# Patient Record
Sex: Male | Born: 1965
Health system: Southern US, Community
[De-identification: ages and names within clinical notes are randomized; demographics above are authoritative.]

## PROBLEM LIST (undated history)

## (undated) DIAGNOSIS — I10 Essential (primary) hypertension: Secondary | ICD-10-CM

## (undated) DIAGNOSIS — R269 Unspecified abnormalities of gait and mobility: Secondary | ICD-10-CM

## (undated) DIAGNOSIS — I639 Cerebral infarction, unspecified: Secondary | ICD-10-CM

## (undated) DIAGNOSIS — C189 Malignant neoplasm of colon, unspecified: Secondary | ICD-10-CM

## (undated) DIAGNOSIS — E119 Type 2 diabetes mellitus without complications: Secondary | ICD-10-CM

## (undated) DIAGNOSIS — I69328 Other speech and language deficits following cerebral infarction: Secondary | ICD-10-CM

## (undated) DIAGNOSIS — R569 Unspecified convulsions: Secondary | ICD-10-CM

## (undated) DIAGNOSIS — I69359 Hemiplegia and hemiparesis following cerebral infarction affecting unspecified side: Secondary | ICD-10-CM

## (undated) HISTORY — DX: Hemiplegia and hemiparesis following cerebral infarction affecting unspecified side: I69.359

## (undated) HISTORY — DX: Unspecified convulsions: R56.9

## (undated) HISTORY — DX: Malignant neoplasm of colon, unspecified: C18.9

## (undated) HISTORY — PX: APPENDECTOMY: SHX54

## (undated) HISTORY — DX: Unspecified abnormalities of gait and mobility: R26.9

## (undated) HISTORY — PX: TOTAL HIP ARTHROPLASTY: SHX124

## (undated) HISTORY — DX: Other speech and language deficits following cerebral infarction: I69.328

## (undated) HISTORY — DX: Type 2 diabetes mellitus without complications: E11.9

## (undated) HISTORY — DX: Cerebral infarction, unspecified: I63.9

---

## 1997-08-16 ENCOUNTER — Ambulatory Visit (HOSPITAL_COMMUNITY): Admission: RE | Admit: 1997-08-16 | Discharge: 1997-08-16 | Payer: Self-pay | Admitting: Cardiology

## 1998-02-14 ENCOUNTER — Encounter: Admission: RE | Admit: 1998-02-14 | Discharge: 1998-02-14 | Payer: Self-pay | Admitting: *Deleted

## 2000-11-26 ENCOUNTER — Encounter: Payer: Self-pay | Admitting: Emergency Medicine

## 2000-11-26 ENCOUNTER — Emergency Department (HOSPITAL_COMMUNITY): Admission: EM | Admit: 2000-11-26 | Discharge: 2000-11-27 | Payer: Self-pay | Admitting: Emergency Medicine

## 2002-01-13 HISTORY — PX: JOINT REPLACEMENT: SHX530

## 2002-06-17 ENCOUNTER — Encounter: Payer: Self-pay | Admitting: Orthopedic Surgery

## 2002-06-20 ENCOUNTER — Inpatient Hospital Stay (HOSPITAL_COMMUNITY): Admission: RE | Admit: 2002-06-20 | Discharge: 2002-06-24 | Payer: Self-pay | Admitting: Orthopedic Surgery

## 2002-06-20 ENCOUNTER — Encounter: Payer: Self-pay | Admitting: Orthopedic Surgery

## 2003-02-28 ENCOUNTER — Emergency Department (HOSPITAL_COMMUNITY): Admission: EM | Admit: 2003-02-28 | Discharge: 2003-02-28 | Payer: Self-pay | Admitting: Family Medicine

## 2004-03-28 ENCOUNTER — Emergency Department (HOSPITAL_COMMUNITY): Admission: EM | Admit: 2004-03-28 | Discharge: 2004-03-28 | Payer: Self-pay | Admitting: Family Medicine

## 2005-01-18 ENCOUNTER — Emergency Department (HOSPITAL_COMMUNITY): Admission: EM | Admit: 2005-01-18 | Discharge: 2005-01-19 | Payer: Self-pay | Admitting: Emergency Medicine

## 2005-12-04 ENCOUNTER — Emergency Department (HOSPITAL_COMMUNITY): Admission: EM | Admit: 2005-12-04 | Discharge: 2005-12-04 | Payer: Self-pay | Admitting: Emergency Medicine

## 2007-01-14 DIAGNOSIS — C189 Malignant neoplasm of colon, unspecified: Secondary | ICD-10-CM

## 2007-01-14 DIAGNOSIS — C20 Malignant neoplasm of rectum: Secondary | ICD-10-CM

## 2007-01-14 HISTORY — DX: Malignant neoplasm of rectum: C20

## 2007-01-14 HISTORY — DX: Malignant neoplasm of colon, unspecified: C18.9

## 2007-08-28 ENCOUNTER — Inpatient Hospital Stay (HOSPITAL_COMMUNITY): Admission: EM | Admit: 2007-08-28 | Discharge: 2007-08-30 | Payer: Self-pay | Admitting: Emergency Medicine

## 2007-08-29 ENCOUNTER — Encounter (INDEPENDENT_AMBULATORY_CARE_PROVIDER_SITE_OTHER): Payer: Self-pay | Admitting: Gastroenterology

## 2007-08-29 ENCOUNTER — Encounter: Payer: Self-pay | Admitting: Gastroenterology

## 2007-08-31 ENCOUNTER — Encounter: Payer: Self-pay | Admitting: Gastroenterology

## 2007-09-01 ENCOUNTER — Encounter: Admission: RE | Admit: 2007-09-01 | Discharge: 2007-09-01 | Payer: Self-pay | Admitting: Gastroenterology

## 2007-09-02 ENCOUNTER — Encounter: Payer: Self-pay | Admitting: Gastroenterology

## 2007-09-02 DIAGNOSIS — C20 Malignant neoplasm of rectum: Secondary | ICD-10-CM | POA: Insufficient documentation

## 2007-09-03 ENCOUNTER — Ambulatory Visit: Payer: Self-pay | Admitting: Gastroenterology

## 2007-09-09 ENCOUNTER — Ambulatory Visit (HOSPITAL_COMMUNITY): Admission: RE | Admit: 2007-09-09 | Discharge: 2007-09-09 | Payer: Self-pay | Admitting: Gastroenterology

## 2007-09-09 ENCOUNTER — Ambulatory Visit: Payer: Self-pay | Admitting: Gastroenterology

## 2007-09-21 ENCOUNTER — Ambulatory Visit (HOSPITAL_BASED_OUTPATIENT_CLINIC_OR_DEPARTMENT_OTHER): Admission: RE | Admit: 2007-09-21 | Discharge: 2007-09-21 | Payer: Self-pay | Admitting: Otolaryngology

## 2007-10-27 ENCOUNTER — Ambulatory Visit: Payer: Self-pay | Admitting: Oncology

## 2007-10-28 LAB — CBC WITH DIFFERENTIAL/PLATELET
BASO%: 0.1 % (ref 0.0–2.0)
LYMPH%: 16.8 % (ref 14.0–48.0)
MCHC: 32.4 g/dL (ref 32.0–35.9)
MONO#: 0.4 10*3/uL (ref 0.1–0.9)
NEUT#: 5.1 10*3/uL (ref 1.5–6.5)
RBC: 5.29 10*6/uL (ref 4.20–5.71)
RDW: 24.5 % — ABNORMAL HIGH (ref 11.2–14.6)
WBC: 6.7 10*3/uL (ref 4.0–10.0)
lymph#: 1.1 10*3/uL (ref 0.9–3.3)

## 2007-10-29 ENCOUNTER — Ambulatory Visit: Admission: RE | Admit: 2007-10-29 | Discharge: 2008-01-13 | Payer: Self-pay | Admitting: Radiation Oncology

## 2007-10-29 LAB — COMPREHENSIVE METABOLIC PANEL
ALT: 18 U/L (ref 0–53)
Albumin: 4.5 g/dL (ref 3.5–5.2)
CO2: 22 mEq/L (ref 19–32)
Chloride: 107 mEq/L (ref 96–112)
Potassium: 4 mEq/L (ref 3.5–5.3)
Sodium: 141 mEq/L (ref 135–145)
Total Bilirubin: 0.4 mg/dL (ref 0.3–1.2)
Total Protein: 7.5 g/dL (ref 6.0–8.3)

## 2007-10-29 LAB — CEA: CEA: 1 ng/mL (ref 0.0–5.0)

## 2007-12-08 LAB — CBC WITH DIFFERENTIAL/PLATELET
Basophils Absolute: 0 10*3/uL (ref 0.0–0.1)
Eosinophils Absolute: 0.1 10*3/uL (ref 0.0–0.5)
HGB: 11.4 g/dL — ABNORMAL LOW (ref 13.0–17.1)
LYMPH%: 10.4 % — ABNORMAL LOW (ref 14.0–48.0)
MCV: 70.4 fL — ABNORMAL LOW (ref 81.6–98.0)
MONO#: 0.3 10*3/uL (ref 0.1–0.9)
NEUT#: 3.7 10*3/uL (ref 1.5–6.5)
Platelets: 252 10*3/uL (ref 145–400)
RBC: 4.82 10*6/uL (ref 4.20–5.71)
RDW: 18.5 % — ABNORMAL HIGH (ref 11.2–14.6)
WBC: 4.6 10*3/uL (ref 4.0–10.0)

## 2007-12-08 LAB — COMPREHENSIVE METABOLIC PANEL
Albumin: 4.3 g/dL (ref 3.5–5.2)
BUN: 13 mg/dL (ref 6–23)
CO2: 26 mEq/L (ref 19–32)
Calcium: 9.3 mg/dL (ref 8.4–10.5)
Glucose, Bld: 84 mg/dL (ref 70–99)
Potassium: 4.3 mEq/L (ref 3.5–5.3)
Sodium: 138 mEq/L (ref 135–145)
Total Protein: 6.9 g/dL (ref 6.0–8.3)

## 2007-12-08 LAB — CEA: CEA: 1.5 ng/mL (ref 0.0–5.0)

## 2007-12-24 ENCOUNTER — Ambulatory Visit: Payer: Self-pay | Admitting: Oncology

## 2008-04-19 ENCOUNTER — Ambulatory Visit: Payer: Self-pay | Admitting: Oncology

## 2008-04-19 LAB — COMPREHENSIVE METABOLIC PANEL
AST: 25 U/L (ref 0–37)
Albumin: 3.8 g/dL (ref 3.5–5.2)
Alkaline Phosphatase: 75 U/L (ref 39–117)
BUN: 10 mg/dL (ref 6–23)
Creatinine, Ser: 0.85 mg/dL (ref 0.40–1.50)
Glucose, Bld: 124 mg/dL — ABNORMAL HIGH (ref 70–99)
Potassium: 3.8 mEq/L (ref 3.5–5.3)

## 2008-04-19 LAB — CBC WITH DIFFERENTIAL/PLATELET
BASO%: 0.3 % (ref 0.0–2.0)
Basophils Absolute: 0 10*3/uL (ref 0.0–0.1)
EOS%: 1.4 % (ref 0.0–7.0)
HCT: 38.9 % (ref 38.4–49.9)
HGB: 13.2 g/dL (ref 13.0–17.1)
LYMPH%: 13.9 % — ABNORMAL LOW (ref 14.0–49.0)
MCH: 25.7 pg — ABNORMAL LOW (ref 27.2–33.4)
MCHC: 33.9 g/dL (ref 32.0–36.0)
MCV: 75.8 fL — ABNORMAL LOW (ref 79.3–98.0)
MONO%: 7.1 % (ref 0.0–14.0)
NEUT%: 77.3 % — ABNORMAL HIGH (ref 39.0–75.0)
Platelets: 184 10*3/uL (ref 140–400)

## 2008-04-26 ENCOUNTER — Ambulatory Visit (HOSPITAL_COMMUNITY): Admission: RE | Admit: 2008-04-26 | Discharge: 2008-04-26 | Payer: Self-pay | Admitting: Oncology

## 2008-07-31 ENCOUNTER — Ambulatory Visit: Payer: Self-pay | Admitting: Oncology

## 2008-08-02 LAB — CBC WITH DIFFERENTIAL/PLATELET
BASO%: 0.3 % (ref 0.0–2.0)
EOS%: 1.5 % (ref 0.0–7.0)
LYMPH%: 14.1 % (ref 14.0–49.0)
MCH: 26.7 pg — ABNORMAL LOW (ref 27.2–33.4)
MCHC: 34.1 g/dL (ref 32.0–36.0)
MONO#: 0.2 10*3/uL (ref 0.1–0.9)
Platelets: 207 10*3/uL (ref 140–400)
RBC: 5.31 10*6/uL (ref 4.20–5.82)
WBC: 4.3 10*3/uL (ref 4.0–10.3)

## 2008-08-02 LAB — COMPREHENSIVE METABOLIC PANEL
ALT: 20 U/L (ref 0–53)
AST: 15 U/L (ref 0–37)
Alkaline Phosphatase: 75 U/L (ref 39–117)
CO2: 21 mEq/L (ref 19–32)
Creatinine, Ser: 1.14 mg/dL (ref 0.40–1.50)
Sodium: 140 mEq/L (ref 135–145)
Total Bilirubin: 0.6 mg/dL (ref 0.3–1.2)
Total Protein: 7.8 g/dL (ref 6.0–8.3)

## 2008-08-02 LAB — CEA: CEA: 0.9 ng/mL (ref 0.0–5.0)

## 2008-11-17 ENCOUNTER — Ambulatory Visit: Payer: Self-pay | Admitting: Oncology

## 2009-08-13 ENCOUNTER — Ambulatory Visit: Payer: Self-pay | Admitting: Oncology

## 2010-05-28 NOTE — Consult Note (Signed)
NAMEBELINDA, BRINGHURST NO.:  [id number]   MEDICAL RECORD NO.:  [id number]          PATIENT TYPE:  INP   LOCATION:  3030                         FACILITY:  [facility]   PHYSICIAN:  Shirley Friar, MDDATE OF BIRTH:  [birth year]   DATE OF CONSULTATION:  DATE OF DISCHARGE:                                 CONSULTATION   REQUESTING PHYSICIAN:  Dr. Ramiro Harvest, MD   REASON:  Anemia, rectal bleeding.   HISTORY OF PRESENT ILLNESS:  Mr. Kinker is a pleasant 45 year old  black male who was admitted this morning with a hemoglobin of 7.0 in the  setting of chronic rectal bleeding.  He reports having intermittent  bright red blood per rectum on toilet tissue as well as in the toilet  bowl for 2 years.  He says, he was told he had hemorrhoids and  occasionally reports that the hemorrhoids push out of his rectum.  He  was given hemorrhoid cream as well as tried over-the-counter cream but  continued to have persistent bright red blood per rectum.  Denies any  black stools.   PAST MEDICAL HISTORY:  Hypertension.   MEDICATIONS:  None prior to admission.   ALLERGIES:  NO KNOWN DRUG ALLERGIES.   FAMILY HISTORY:  Noncontributory.   SOCIAL HISTORY:  Denies alcohol or smoking.   REVIEW OF SYSTEMS:  GI:  Negative from GI standpoint as stated above.   PHYSICAL EXAM:  VITAL SIGNS:  Temperature 98.3, pulse 88, blood pressure  147/94, O2 sat is 96% on room air.  GENERAL:  Alert, no acute distress.  ABDOMEN:  Soft, nontender, nondistended.  Active bowel sounds.   LABS:  Hemoglobin 7.0 on admission with an MCV of 56, platelet count  301, hemoglobin currently 9.1, INR 1.1.   IMPRESSION:  45 year old black male with chronic intermittent bright red  blood per rectum and microcytic anemia.  I think his bright red blood  per rectum is most likely internal hemorrhoids related, but would  recommend a colonoscopy to evaluate for other sources of rectal  bleeding.  Also with his  microcytic anemia he needs another colonoscopy  and possible upper endoscopy.  I discussed risks, benefits of the  procedure with him, he agrees to proceed.  The patient on clear liquid  diet and start prep for colonoscopy today with plans to do the procedure  on  August 29, 2007.  If these procedures are negative for source of his  bleeding but he has a large hemorrhoids, then may benefit from  therapeutic maneuvers to hemorrhoids prior to looking for other  etiologies.  I doubt he will need a small bowel evaluation if these  procedures and negative unless his hemorrhoids are minimal.      Shirley Friar, MD  Electronically Signed     VCS/MEDQ  D:  08/28/2007  T:  08/29/2007  Job:  [id number]

## 2010-05-28 NOTE — Op Note (Signed)
NAMEPASCAL, STIGGERS             ACCOUNT NO.:  [id number]   MEDICAL RECORD NO.:  [id number]          PATIENT TYPE:  AMB   LOCATION:  DSC                          FACILITY:  [facility]   PHYSICIAN:  Almond Lint, MD       DATE OF BIRTH:  12-04-1965   DATE OF PROCEDURE:  09/21/2007  DATE OF DISCHARGE:                               OPERATIVE REPORT   PREOPERATIVE DIAGNOSIS:  Rectal carcinoma.   POSTOPERATIVE DIAGNOSIS:  Rectal carcinoma.   PROCEDURE:  Transanal excision of rectal carcinoma.   SURGEON:  Almond Lint, MD   ANESTHESIA:  Local plus general.   SPECIMEN:  Polyp and additional distal margin to pathology.   ESTIMATED BLOOD LOSS:  25 mL.   COMPLICATIONS:  None.   PROCEDURE:  Mr. Wiedeman was identified in the holding area and taken to  the operating room and placed in the operating room in supine fashion.  General endotracheal anesthesia was induced.  The patient was then  placed in the lithotomy position and his perirectal area was prepped and  draped in sterile fashion.  Prior to commencing the procedure, a time-  out was performed confirming the patient, the site of surgery,  equipment, preoperative antibiotic administration, and operating room  staff.  These were correct and so we commenced.   First a digital examination was done.  The patient definitely had  several large external hemorrhoids.  The proctoscope was placed into the  rectum and immediately a large polypoid lesion popped into view.  This  has not been immediately apparent on exam, as it was so large and soft.  The area of stalk of this polyp was anesthetized as well as the  sphincter.  The polyp was then excised with the Metzenbaum scissors and  the Bovie.  This was on top of a hemorrhoidal pile and there was one  site of bleeding which was controlled using a 3-0 chromic.  The  sphincter was palpated making sure we were not going to the sphincter,  however, because of this we were unable to take a  full-thickness portion  of the rectum.  The mucosa proximal to this lesion appeared to have some  mosaicism.  Once the polyp was completely excised, it was apparent that  there was a minimal margin on the distal mucosa.  An additional 5 mm of  mucosa were taken distally.  The defect was then closed partially with  running 2-0 Vicryl.  The distal aspect of the incision was left open.  This was irrigated and there was no bleeding and no hematoma seen.  The  remainder of the anus was examined with the anoscope and he had no  additional lesions seen.  He had several small  internal hemorrhoids in addition to the external hemorrhoids.  The  rectum was then packed using Gelfoam with lidocaine jelly and additional  local anesthesia was infiltrated in the area of the incision.  The  patient was awakened from anesthesia and then taken to PACU in stable  condition.      Almond Lint, MD  Electronically Signed  FB/MEDQ  D:  09/21/2007  T:  09/22/2007  Job:  [id number]

## 2010-05-28 NOTE — Op Note (Signed)
[name], [name]             ACCOUNT NO.:  [id number]   MEDICAL RECORD NO.:  [id number]          PATIENT TYPE:  INP   LOCATION:  3030                         FACILITY:  [facility]   PHYSICIAN:  Shirley Friar, MDDATE OF BIRTH:  July 17, 1965   DATE OF PROCEDURE:  DATE OF DISCHARGE:                               OPERATIVE REPORT   REASON FOR CONSULTATION:  GI bleed, anemia.   MEDICATIONS:  Fentanyl 75 mcg IV and Versed 7 mg IV.   FINDINGS:  Rectal exam prior to procedure was normal without any masses  appreciated.  The colonoscope was inserted into the rectum where small  amount of blood products were seen.  Retroflexion was then done prior to  advancement of the endoscope and a large anorectal mass was seen.  This  mass was multilobulated and nonbleeding at the time of insertion.  The  colonoscope was straightened and advanced around the colon to the cecum  where ileocecal valve and appendiceal orifice were identified.  The  terminal ileum was intubated and was normal in appearance.  On careful  withdrawal of the colonoscope, a single large diverticulum was seen in  the proximal ascending colon.  Upon withdrawal of the colonoscope, no  other mucosal abnormalities were seen except for this large anorectal  mass seen in the distal rectum.  Retroflexion was done to evaluate this  mass and it appeared to be arising from a stalk in the distal rectum.  Although on one view of the mass, it looked like it was intertwined in  hemorrhoidal tissue.  Due to inability to discern whether anorectal  tissue was involved, decision was made to do cold biopsies for  histology.  On slow withdrawal after retroflexion and on slow withdrawal  through the anorectal canal, it appeared that the mass was involving the  dentate line on endoscopic appearance.  As stated above, multiple  biopsies were taken for histology.   ASSESSMENT:  1. Large anorectal mass, question squamous cell tissue versus  adenocarcinoma.  The mass looks like it is arising from the      anorectal junction.  Follow up on biopsies.  Most likely will need      surgical removal, but we will await biopsy results in case      endoscopic ultrasound is necessary if this is rectal cancer.  2. Restart diet.  3. Await pathology prior to surgical evaluation.  4. Upper endoscopy cancelled due to these findings.      Shirley Friar, MD  Electronically Signed     VCS/MEDQ  D:  08/29/2007  T:  08/30/2007  Job:  [id number]   cc:   Almond Lint, MD

## 2010-05-28 NOTE — H&P (Signed)
[name], [name]             ACCOUNT NO.:  [id number]   MEDICAL RECORD NO.:  [id number]          PATIENT TYPE:  INP   LOCATION:  3030                         FACILITY:  [facility]   PHYSICIAN:  Deirdre Peer. Polite, M.D. DATE OF BIRTH:  September 13, 1965   DATE OF ADMISSION:  08/27/2007  DATE OF DISCHARGE:                              HISTORY & PHYSICAL   The patient is unsure of his primary MD name, sees a medical doctor at  [facility].   CHIEF COMPLAINT:  Right elbow pain.   HISTORY OF PRESENT ILLNESS:  A 45 year old male presented to the ED for  complaint of right elbow pain secondary to gout.  The patient had low-  grade temp, otherwise hemodynamically stable.  He was treated for gout.  CBC was ordered to exclude elevated white count.  White count was normal  at 10.5; however, the patient has significant anemia, hemoglobin of 7  and MCV of 56.  The patient admits to chronic history of bright red  blood per rectum greater than 2 years, always attributed to hemorrhoid,  prior to that he frequently donated blood.  He states he donated every  cycle for at least 2 years until he was told that he could not donate  anymore because his iron was low.  He never really took iron to  adequately replace his stores.  Admission was deemed necessary for  further evaluation and treatment.  The patient does admit to feeling  short of breath, dyspnea on exertion.  He denies any chronic NSAID use,  probably takes a Motrin-type product once or twice when he has gout  flare.   PAST MEDICAL HISTORY:  1. Significant for gout.  2. Elevated blood pressure.  3. Obesity.  4. Status post right total hip arthroplasty secondary to DJD.   CURRENT MEDICATIONS:  None.   SOCIAL HISTORY:  Negative for tobacco.  Social alcohol.  No drugs.   ALLERGIES:  None.   FAMILY HISTORY:  Mother has coronary artery disease.  Father deceased of  unknown cause.  Two brothers, one with hypertension, one sister.   REVIEW OF  SYSTEMS:  As stated in HPI.   PAST SURGICAL HISTORY:  Left total hip repair in 2004.   PHYSICAL EXAMINATION:  GENERAL:  The patient is alert and oriented x3,  no apparent distress.  VITAL SIGNS:  Temp 100.1, BP 137/78, pulse 100, respiratory rate of 20,  and sat 98%.  HEENT:  Significant for pale sclerae.  Moist oral mucosa.  NECK:  Supple.  No adenopathy or thyromegaly.  CHEST:  Clear without rales or rhonchi.  CARDIOVASCULAR:  Regular S1 and S2.  ABDOMEN:  Soft and nontender.  EXTREMITIES:  No edema.  Right elbow full range of motion.  No warmth.  No erythema.  RECTAL:  Prostate within normal limits.  Hemoccult was positive.  NEURO:  Nonfocal.   ASSESSMENT:  1. Anemia with markedly low MCV in a patient with heme-positive stool.      Please note, no history of thalassemia or sickle cell, the patient      does have a history of frequent  blood donation in the remote past      without repleting his iron stores and also has history of chronic      bright red blood per rectum presumed secondary to hemorrhoids.      However, no hemorrhoid noted on exam, however that does not exclude      an internal hemorrhoid.  2. Gout, right elbow, stable.  3. History of elevated blood pressure.  4. Obesity.  5. Status post right total hip arthroplasty secondary to degenerative      joint disease.   Recommend the patient be admitted to a medical floor bed.  The patient  will be transfused.  We will obtain iron studies.  With heme-positive  stools and drifting down of hemoglobin down to 7, the patient will  require colonoscopy.  However, as he is hemodynamically stable, may be  able to be done on an outpatient basis.  The current anemia is probably  multifactorial secondary to chronic bleeding from the rectum as well as  remote history of frequent blood donation in the past without repleting  iron stores.      Deirdre Peer. Polite, M.D.  Electronically Signed     RDP/MEDQ  D:  08/28/2007  T:   08/28/2007  Job:  [id number]

## 2010-05-31 NOTE — Discharge Summary (Signed)
[name] NO.:  [id number]   MEDICAL RECORD NO.:  [id number]          PATIENT TYPE:  INP   LOCATION:  3030                         FACILITY:  [facility]   PHYSICIAN:  Ramiro Harvest, MD    DATE OF BIRTH:  02-06-1965   DATE OF ADMISSION:  08/28/2007  DATE OF DISCHARGE:  08/30/2007                               DISCHARGE SUMMARY   PRIMARY CARE PHYSICIAN:  Dr. Tally Joe of [facility].   GASTROENTEROLOGIST:  Dr. Charlott Rakes of [facility].   DISCHARGE DIAGNOSES:  1. Colorectal adenocarcinoma associated with fragments of      tubulovillous adenoma.  2. Severe microcytic anemia with intermittent bright red blood per      rectum secondary to problem #1.  3. Acute gout.  4. Hypertension.  5. Status post right total hip arthroscopy secondary to degenerative      joint disease.  6. Obesity.  7. History of elevated blood pressure.   DISCHARGE MEDICATIONS:  1. Colchicine 0.6 mg p.o. daily.  2. Iron sulfate 325 mg p.o. t.i.d.   DISPOSITION AND FOLLOW UP:  The patient will be discharged home to  follow up with his PCP in 2 weeks and follow-up CBC will need to be  obtained to follow up on the patient's hemoglobin.  The patient is also  to follow up with Dr. Charlott Rakes of [facility] in the  next 2-4 days.   CONSULTATIONS:  1. A GI consult was sought.  The patient was seen in consultation by      Dr. Charlott Rakes on September 28, 2007.   PROCEDURES PERFORMED:  1. The patient was transfused 2 units of packed red blood cells on      August 28, 2007.  2. The patient had plain films of the elbow done on August 27, 2007,      that showed a large joint effusion.  No skeletal abnormalities.  3. The patient had a colonoscopy done on August 29, 2007, by Dr.      Charlott Rakes that showed a large anorectal mass, question      squamous cell tissue versus adenocarcinoma.  The mass looks like it      is arising from the anorectal  junction.  Follow-up on biopsies.      Most likely, he will need surgical removal, but we will await      biopsy results in case endoscopic ultrasound is necessary if this      is rectal cancer.  Await pathology prior to surgical evaluation.   BRIEF ADMISSION HISTORY AND PHYSICAL:  [name] is a 45 year old  male who had presented to the ED for complaint of right elbow pain  secondary to gout.  The patient had a low-grade temp, otherwise was  hemodynamically stable.  The patient was treated for gout.  CBC was  ordered to exclude an elevated white count.  The white count was normal  at 10.5, however, the patient had significant anemia with a hemoglobin  of 7 and an MCV of 56.  Patient admitted due to a chronic history of her  bright  red blood per rectum greater than 2 years, which was attributed  to his hemorrhoids.  Prior to that, he frequently donated blood.  The  patient stated he had donated every cycle for at least 2 years until he  was told he could not donate any more because his iron was low.  He  never really took any iron to adequately replace his stores.  Admission  was deemed necessary for further evaluation and treatment.  The patient  did admit to feeling short of breath on exertion.  Denied any chronic  NSAID use, probably took some Motrin type product once or twice when he  had a gout flare.   PHYSICAL EXAMINATION:  VITAL SIGNS:  Per admitting physician;  temperature of 100.1, blood pressure 137/78, pulse of 100, respiratory  rate 20.  Sating 98% on room air.  GENERAL:  The patient is alert and oriented x3 in no apparent distress.  HEENT:  Normocephalic, atraumatic.  Pupils equal, round and reactive to  light and accommodation.  Extraocular movements intact.  Sclerae pale.  Moist oral mucosa.  NECK:  Supple.  No adenopathy or thyromegaly.  RESPIRATORY:  Lungs were clear to auscultation bilaterally.  No wheezes,  no rales, no rhonchi.  CARDIOVASCULAR:  Regular rate  rhythm.  No  murmurs, rubs or gallops.  ABDOMEN:  Soft, nontender, nondistended.  Positive bowel sounds.  EXTREMITIES:  No clubbing, cyanosis or edema.  Right elbow with a full  range of motion.  No warmth, no erythema.  RECTAL:  Prostate was within normal limits.  Hemoccult was positive.  NEUROLOGICAL:  Nonfocal.   HOSPITAL COURSE:  1. Colorectal adenocarcinoma associated with fragments of      tubulovillous adenoma.  The patient was initially admitted with      symptomatic anemia with a hemoglobin of 7.  The patient was typed      and crossed, placed on IV fluids, placed on IV Protonix as well and      was transfused 2 units of packed red blood cells.  GI was      consulted.  The patient was seen in consultation by Dr. Charlott Rakes on August 28, 2007.  The patient was prepped for      colonoscopy on August 29, 2007.  Colonoscopy was done with results      as stated above.  Pathologies were sent, and the patient was also      placed on iron sulfate.  The patient remained stable.  The      patient's shortness of breath improved and generalized weakness      improved throughout the hospitalization.  The patient was      discharged in stable and improved condition to follow up with at      Dr. Charlott Rakes as an outpatient to follow up on his biopsies      and further workup of the rectal mass that was seen on colonoscopy.      The patient was discharged in stable and improved condition.  2. Severe microcytic anemia with intermittent bright red blood per      rectum secondary to problem #1.  See problem #1.  The patient was      transfused 2 units of packed red blood cells with an appropriate      response.  The patient remained stable.  The patient's hemoglobin      remained stable after his transfusion and the patient was  discharged in stable and improved condition on iron sulfate 325 mg      t.i.d. with follow-up with gastroenterology in the next 2-4 days.  3. Acute  gout.  The patient had come in with an acute gout flare.  The      patient was placed on colchicine with improvement in his symptoms.      The patient was also well hydrated during the hospitalization.  By      the day of discharge, the patient was in stable and improved      condition.  The rest of the patient's chronic medical issues were      stable throughout the hospitalization, and the patient was      discharged in stable and improved condition.   On the day of discharge, vital signs; temperature 99.6, blood pressure  129/78, pulse of 96, respiratory rate 20, sating 98% on room air.   DISCHARGE LABS:  CBC - white count 6.6, hemoglobin 9.0, platelets of  374, hematocrit of 29.4.   It was a pleasure taking care of [name].      Ramiro Harvest, MD  Electronically Signed     DT/MEDQ  D:  10/07/2007  T:  10/07/2007  Job:  [id number]   cc:   Tally Joe, M.D.  Shirley Friar, MD

## 2010-10-11 LAB — CBC
HCT: 23.5 — ABNORMAL LOW
Platelets: 301
RDW: 20.7 — ABNORMAL HIGH

## 2010-10-11 LAB — DIFFERENTIAL
Basophils Absolute: 0
Eosinophils Absolute: 0
Lymphocytes Relative: 8 — ABNORMAL LOW
Lymphs Abs: 0.8
Neutro Abs: 9.2 — ABNORMAL HIGH

## 2014-02-01 ENCOUNTER — Encounter (HOSPITAL_COMMUNITY): Payer: Self-pay | Admitting: *Deleted

## 2014-02-01 ENCOUNTER — Emergency Department (HOSPITAL_COMMUNITY)
Admission: EM | Admit: 2014-02-01 | Discharge: 2014-02-01 | Disposition: A | Discharge status: Home / Self Care | Payer: BLUE CROSS/BLUE SHIELD | Attending: Emergency Medicine | Admitting: Emergency Medicine

## 2014-02-01 ENCOUNTER — Ambulatory Visit (INDEPENDENT_AMBULATORY_CARE_PROVIDER_SITE_OTHER): Payer: BLUE CROSS/BLUE SHIELD | Admitting: Physician Assistant

## 2014-02-01 VITALS — BP 220/120 | HR 81 | Temp 98.4°F | Resp 18 | Ht 65.0 in | Wt 224.4 lb

## 2014-02-01 DIAGNOSIS — I10 Essential (primary) hypertension: Secondary | ICD-10-CM | POA: Diagnosis not present

## 2014-02-01 DIAGNOSIS — R42 Dizziness and giddiness: Secondary | ICD-10-CM | POA: Diagnosis not present

## 2014-02-01 DIAGNOSIS — H539 Unspecified visual disturbance: Secondary | ICD-10-CM

## 2014-02-01 DIAGNOSIS — E119 Type 2 diabetes mellitus without complications: Secondary | ICD-10-CM | POA: Insufficient documentation

## 2014-02-01 DIAGNOSIS — R51 Headache: Secondary | ICD-10-CM | POA: Diagnosis present

## 2014-02-01 DIAGNOSIS — Z7982 Long term (current) use of aspirin: Secondary | ICD-10-CM | POA: Diagnosis not present

## 2014-02-01 DIAGNOSIS — R079 Chest pain, unspecified: Secondary | ICD-10-CM

## 2014-02-01 DIAGNOSIS — H538 Other visual disturbances: Secondary | ICD-10-CM | POA: Insufficient documentation

## 2014-02-01 DIAGNOSIS — C20 Malignant neoplasm of rectum: Secondary | ICD-10-CM

## 2014-02-01 DIAGNOSIS — I160 Hypertensive urgency: Secondary | ICD-10-CM

## 2014-02-01 HISTORY — DX: Essential (primary) hypertension: I10

## 2014-02-01 LAB — URINALYSIS, ROUTINE W REFLEX MICROSCOPIC
BILIRUBIN URINE: NEGATIVE
GLUCOSE, UA: NEGATIVE mg/dL
HGB URINE DIPSTICK: NEGATIVE
KETONES UR: NEGATIVE mg/dL
Leukocytes, UA: NEGATIVE
Nitrite: NEGATIVE
Protein, ur: NEGATIVE mg/dL
Specific Gravity, Urine: 1.01 (ref 1.005–1.030)
Urobilinogen, UA: 0.2 mg/dL (ref 0.0–1.0)
pH: 6 (ref 5.0–8.0)

## 2014-02-01 LAB — CBC WITH DIFFERENTIAL/PLATELET
BASOS ABS: 0 10*3/uL (ref 0.0–0.1)
BASOS PCT: 0 % (ref 0–1)
EOS ABS: 0.1 10*3/uL (ref 0.0–0.7)
Eosinophils Relative: 2 % (ref 0–5)
HCT: 39.1 % (ref 39.0–52.0)
Hemoglobin: 13.6 g/dL (ref 13.0–17.0)
Lymphocytes Relative: 28 % (ref 12–46)
Lymphs Abs: 1.5 10*3/uL (ref 0.7–4.0)
MCH: 27 pg (ref 26.0–34.0)
MCHC: 34.8 g/dL (ref 30.0–36.0)
MCV: 77.6 fL — ABNORMAL LOW (ref 78.0–100.0)
Monocytes Absolute: 0.3 10*3/uL (ref 0.1–1.0)
Monocytes Relative: 6 % (ref 3–12)
NEUTROS ABS: 3.4 10*3/uL (ref 1.7–7.7)
NEUTROS PCT: 64 % (ref 43–77)
Platelets: 230 10*3/uL (ref 150–400)
RBC: 5.04 MIL/uL (ref 4.22–5.81)
RDW: 14.4 % (ref 11.5–15.5)
WBC: 5.4 10*3/uL (ref 4.0–10.5)

## 2014-02-01 LAB — BASIC METABOLIC PANEL
Anion gap: 9 (ref 5–15)
BUN: 11 mg/dL (ref 6–23)
CHLORIDE: 104 meq/L (ref 96–112)
CO2: 27 mmol/L (ref 19–32)
CREATININE: 1.07 mg/dL (ref 0.50–1.35)
Calcium: 9.9 mg/dL (ref 8.4–10.5)
GFR calc non Af Amer: 81 mL/min — ABNORMAL LOW (ref >90)
Glucose, Bld: 95 mg/dL (ref 70–99)
Potassium: 4.6 mmol/L (ref 3.5–5.1)
SODIUM: 140 mmol/L (ref 135–145)

## 2014-02-01 MED ORDER — AMLODIPINE BESYLATE 5 MG PO TABS
10.0000 mg | ORAL_TABLET | Freq: Once | ORAL | Status: AC
  Administered 2014-02-01: 10 mg via ORAL
  Filled 2014-02-01: qty 2

## 2014-02-01 MED ORDER — HYDROCHLOROTHIAZIDE 25 MG PO TABS: 25.0000 mg | ORAL_TABLET | Freq: Every day | ORAL | Status: DC

## 2014-02-01 MED ORDER — AMLODIPINE BESYLATE 10 MG PO TABS: 10.0000 mg | ORAL_TABLET | Freq: Every day | ORAL | Status: DC

## 2014-02-01 MED ORDER — HYDROCHLOROTHIAZIDE 25 MG PO TABS
25.0000 mg | ORAL_TABLET | Freq: Every day | ORAL | Status: DC
  Administered 2014-02-01: 25 mg via ORAL
  Filled 2014-02-01: qty 1

## 2014-02-01 NOTE — Discharge Instructions (Signed)
DASH Eating Plan °DASH stands for "Dietary Approaches to Stop Hypertension." The DASH eating plan is a healthy eating plan that has been shown to reduce high blood pressure (hypertension). Additional health benefits may include reducing the risk of type 2 diabetes mellitus, heart disease, and stroke. The DASH eating plan may also help with weight loss. °WHAT DO I NEED TO KNOW ABOUT THE DASH EATING PLAN? °For the DASH eating plan, you will follow these general guidelines: °· Choose foods with a percent daily value for sodium of less than 5% (as listed on the food label). °· Use salt-free seasonings or herbs instead of table salt or sea salt. °· Check with your health care provider or pharmacist before using salt substitutes. °· Eat lower-sodium products, often labeled as "lower sodium" or "no salt added." °· Eat fresh foods. °· Eat more vegetables, fruits, and low-fat dairy products. °· Choose whole grains. Look for the word "whole" as the first word in the ingredient list. °· Choose fish and skinless chicken or turkey more often than red meat. Limit fish, poultry, and meat to 6 oz (170 g) each day. °· Limit sweets, desserts, sugars, and sugary drinks. °· Choose heart-healthy fats. °· Limit cheese to 1 oz (28 g) per day. °· Eat more home-cooked food and less restaurant, buffet, and fast food. °· Limit fried foods. °· Cook foods using methods other than frying. °· Limit canned vegetables. If you do use them, rinse them well to decrease the sodium. °· When eating at a restaurant, ask that your food be prepared with less salt, or no salt if possible. °WHAT FOODS CAN I EAT? °Seek help from a dietitian for individual calorie needs. °Grains °Whole grain or whole wheat bread. Brown rice. Whole grain or whole wheat pasta. Quinoa, bulgur, and whole grain cereals. Low-sodium cereals. Corn or whole wheat flour tortillas. Whole grain cornbread. Whole grain crackers. Low-sodium crackers. °Vegetables °Fresh or frozen vegetables  (raw, steamed, roasted, or grilled). Low-sodium or reduced-sodium tomato and vegetable juices. Low-sodium or reduced-sodium tomato sauce and paste. Low-sodium or reduced-sodium canned vegetables.  °Fruits °All fresh, canned (in natural juice), or frozen fruits. °Meat and Other Protein Products °Ground beef (85% or leaner), grass-fed beef, or beef trimmed of fat. Skinless chicken or turkey. Ground chicken or turkey. Pork trimmed of fat. All fish and seafood. Eggs. Dried beans, peas, or lentils. Unsalted nuts and seeds. Unsalted canned beans. °Dairy °Low-fat dairy products, such as skim or 1% milk, 2% or reduced-fat cheeses, low-fat ricotta or cottage cheese, or plain low-fat yogurt. Low-sodium or reduced-sodium cheeses. °Fats and Oils °Tub margarines without trans fats. Light or reduced-fat mayonnaise and salad dressings (reduced sodium). Avocado. Safflower, olive, or canola oils. Natural peanut or almond butter. °Other °Unsalted popcorn and pretzels. °The items listed above may not be a complete list of recommended foods or beverages. Contact your dietitian for more options. °WHAT FOODS ARE NOT RECOMMENDED? °Grains °White bread. White pasta. White rice. Refined cornbread. Bagels and croissants. Crackers that contain trans fat. °Vegetables °Creamed or fried vegetables. Vegetables in a cheese sauce. Regular canned vegetables. Regular canned tomato sauce and paste. Regular tomato and vegetable juices. °Fruits °Dried fruits. Canned fruit in light or heavy syrup. Fruit juice. °Meat and Other Protein Products °Fatty cuts of meat. Ribs, chicken wings, bacon, sausage, bologna, salami, chitterlings, fatback, hot dogs, bratwurst, and packaged luncheon meats. Salted nuts and seeds. Canned beans with salt. °Dairy °Whole or 2% milk, cream, half-and-half, and cream cheese. Whole-fat or sweetened yogurt. Full-fat   cheeses or blue cheese. Nondairy creamers and whipped toppings. Processed cheese, cheese spreads, or cheese  curds. °Condiments °Onion and garlic salt, seasoned salt, table salt, and sea salt. Canned and packaged gravies. Worcestershire sauce. Tartar sauce. Barbecue sauce. Teriyaki sauce. Soy sauce, including reduced sodium. Steak sauce. Fish sauce. Oyster sauce. Cocktail sauce. Horseradish. Ketchup and mustard. Meat flavorings and tenderizers. Bouillon cubes. Hot sauce. Tabasco sauce. Marinades. Taco seasonings. Relishes. °Fats and Oils °Butter, stick margarine, lard, shortening, ghee, and bacon fat. Coconut, palm kernel, or palm oils. Regular salad dressings. °Other °Pickles and olives. Salted popcorn and pretzels. °The items listed above may not be a complete list of foods and beverages to avoid. Contact your dietitian for more information. °WHERE CAN I FIND MORE INFORMATION? °National Heart, Lung, and Blood Institute: www.nhlbi.nih.gov/health/health-topics/topics/dash/ °Document Released: 12/19/2010 Document Revised: 05/16/2013 Document Reviewed: 11/03/2012 °ExitCare® Patient Information ©2015 ExitCare, LLC. This information is not intended to replace advice given to you by your health care provider. Make sure you discuss any questions you have with your health care provider. ° °Hypertension °Hypertension, commonly called high blood pressure, is when the force of blood pumping through your arteries is too strong. Your arteries are the blood vessels that carry blood from your heart throughout your body. A blood pressure reading consists of a higher number over a lower number, such as 110/72. The higher number (systolic) is the pressure inside your arteries when your heart pumps. The lower number (diastolic) is the pressure inside your arteries when your heart relaxes. Ideally you want your blood pressure below 120/80. °Hypertension forces your heart to work harder to pump blood. Your arteries may become narrow or stiff. Having hypertension puts you at risk for heart disease, stroke, and other problems.  °RISK  FACTORS °Some risk factors for high blood pressure are controllable. Others are not.  °Risk factors you cannot control include:  °· Race. You may be at higher risk if you are African American. °· Age. Risk increases with age. °· Gender. Men are at higher risk than women before age 45 years. After age 65, women are at higher risk than men. °Risk factors you can control include: °· Not getting enough exercise or physical activity. °· Being overweight. °· Getting too much fat, sugar, calories, or salt in your diet. °· Drinking too much alcohol. °SIGNS AND SYMPTOMS °Hypertension does not usually cause signs or symptoms. Extremely high blood pressure (hypertensive crisis) may cause headache, anxiety, shortness of breath, and nosebleed. °DIAGNOSIS  °To check if you have hypertension, your health care provider will measure your blood pressure while you are seated, with your arm held at the level of your heart. It should be measured at least twice using the same arm. Certain conditions can cause a difference in blood pressure between your right and left arms. A blood pressure reading that is higher than normal on one occasion does not mean that you need treatment. If one blood pressure reading is high, ask your health care provider about having it checked again. °TREATMENT  °Treating high blood pressure includes making lifestyle changes and possibly taking medicine. Living a healthy lifestyle can help lower high blood pressure. You may need to change some of your habits. °Lifestyle changes may include: °· Following the DASH diet. This diet is high in fruits, vegetables, and whole grains. It is low in salt, red meat, and added sugars. °· Getting at least 2½ hours of brisk physical activity every week. °· Losing weight if necessary. °· Not smoking. °·   Limiting alcoholic beverages.  Learning ways to reduce stress. If lifestyle changes are not enough to get your blood pressure under control, your health care provider may  prescribe medicine. You may need to take more than one. Work closely with your health care provider to understand the risks and benefits. HOME CARE INSTRUCTIONS  Have your blood pressure rechecked as directed by your health care provider.   Take medicines only as directed by your health care provider. Follow the directions carefully. Blood pressure medicines must be taken as prescribed. The medicine does not work as well when you skip doses. Skipping doses also puts you at risk for problems.   Do not smoke.   Monitor your blood pressure at home as directed by your health care provider. SEEK MEDICAL CARE IF:   You think you are having a reaction to medicines taken.  You have recurrent headaches or feel dizzy.  You have swelling in your ankles.  You have trouble with your vision. SEEK IMMEDIATE MEDICAL CARE IF:  You develop a severe headache or confusion.  You have unusual weakness, numbness, or feel faint.  You have severe chest or abdominal pain.  You vomit repeatedly.  You have trouble breathing. MAKE SURE YOU:   Understand these instructions.  Will watch your condition.  Will get help right away if you are not doing well or get worse. Document Released: 12/30/2004 Document Revised: 05/16/2013 Document Reviewed: 10/22/2012 [facility] Patient Information 2015 [place], [place]. This information is not intended to replace advice given to you by your health care provider. Make sure you discuss any questions you have with your health care provider.  How to Take Your Blood Pressure HOW DO I GET A BLOOD PRESSURE MACHINE?  You can buy an electronic home blood pressure machine at your local pharmacy. Insurance will sometimes cover the cost if you have a prescription.  Ask your doctor what type of machine is best for you. There are different machines for your arm and your wrist.  If you decide to buy a machine to check your blood pressure on your arm, first check the size of  your arm so you can buy the right size cuff. To check the size of your arm:   Use a measuring tape that shows both inches and centimeters.   Wrap the measuring tape around the upper-middle part of your arm. You may need someone to help you measure.   Write down your arm measurement in both inches and centimeters.   To measure your blood pressure correctly, it is important to have the right size cuff.   If your arm is up to 13 inches (up to 34 centimeters), get an adult cuff size.  If your arm is 13 to 17 inches (35 to 44 centimeters), get a large adult cuff size.    If your arm is 17 to 20 inches (45 to 52 centimeters), get an adult thigh cuff.  WHAT DO THE NUMBERS MEAN?   There are two numbers that make up your blood pressure. For example: 120/80.  The first number (120 in our example) is called the "systolic pressure." It is a measure of the pressure in your blood vessels when your heart is pumping blood.  The second number (80 in our example) is called the "diastolic pressure." It is a measure of the pressure in your blood vessels when your heart is resting between beats.  Your doctor will tell you what your blood pressure should be. WHAT SHOULD I DO BEFORE  I CHECK MY BLOOD PRESSURE?   Try to rest or relax for at least 30 minutes before you check your blood pressure.  Do not smoke.  Do not have any drinks with caffeine, such as:  Soda.  Coffee.  Tea.  Check your blood pressure in a quiet room.  Sit down and stretch out your arm on a table. Keep your arm at about the level of your heart. Let your arm relax.  Make sure that your legs are not crossed. HOW DO I CHECK MY BLOOD PRESSURE?  Follow the directions that came with your machine.  Make sure you remove any tight-fighting clothing from your arm or wrist. Wrap the cuff around your upper arm or wrist. You should be able to fit a finger between the cuff and your arm. If you cannot fit a finger between the cuff  and your arm, it is too tight and should be removed and rewrapped.  Some units require you to manually pump up the arm cuff.  Automatic units inflate the cuff when you press a button.  Cuff deflation is automatic in both models.  After the cuff is inflated, the unit measures your blood pressure and pulse. The readings are shown on a monitor. Hold still and breathe normally while the cuff is inflated.  Getting a reading takes less than a minute.  Some models store readings in a memory. Some provide a printout of readings. If your machine does not store your readings, keep a written record.  Take readings with you to your next visit with your doctor. Document Released: 12/13/2007 Document Revised: 05/16/2013 Document Reviewed: 02/24/2013 [facility] Patient Information 2015 [place], [place]. This information is not intended to replace advice given to you by your health care provider. Make sure you discuss any questions you have with your health care provider.    Emergency Department Resource Guide 1) Find a Doctor and Pay Out of Pocket Although you won't have to find out who is covered by your insurance plan, it is a good idea to ask around and get recommendations. You will then need to call the office and see if the doctor you have chosen will accept you as a new patient and what types of options they offer for patients who are self-pay. Some doctors offer discounts or will set up payment plans for their patients who do not have insurance, but you will need to ask so you aren't surprised when you get to your appointment.  2) Contact Your Local Health Department Not all health departments have doctors that can see patients for sick visits, but many do, so it is worth a call to see if yours does. If you don't know where your local health department is, you can check in your phone book. The CDC also has a tool to help you locate your state's health department, and many state websites also have  listings of all of their local health departments.  3) Find a [facility] If your illness is not likely to be very severe or complicated, you may want to try a walk in clinic. These are popping up all over the country in pharmacies, drugstores, and shopping centers. They're usually staffed by nurse practitioners or physician assistants that have been trained to treat common illnesses and complaints. They're usually fairly quick and inexpensive. However, if you have serious medical issues or chronic medical problems, these are probably not your best option.  No Primary Care Doctor: - Call Health Connect at  [phone] -  they can help you locate a primary care doctor that  accepts your insurance, provides certain services, etc. - Physician Referral Service- [phone]  Chronic Pain Problems: Organization         Address  Phone   Notes  [facility]  [phone] Patients need to be referred by their primary care doctor.   Medication Assistance: Organization         Address  Phone   Notes  [facility] Medication [facility] [address] [place], [place] [address] [phone] --Must be a resident of [facility] -- Must have NO insurance coverage whatsoever (no Medicaid/ Medicare, etc.) -- The pt. MUST have a primary care doctor that directs their care regularly and follows them in the community   MedAssist  [phone]   [employer]  [phone]    Agencies that provide inexpensive medical care: Organization         Address  Phone   Notes  [place]  [phone]   [facility]    [phone]   [facility] [place], [place] [address] [phone]   [place] [address], [place] [phone]   Planned Parenthood    [phone]   [facility]    [phone]   [place] and [place]  [address], [place] Phone:  [phone], Fax:  [phone] Hours of Operation:  9 am - 6 pm, M-F.  Also accepts Medicaid/Medicare and self-pay.  [facility] for [place], [address], [place] Phone: [phone], Fax: [phone]. Hours of Operation:  8:30 am - 5:30 pm, M-F.  Also accepts Medicaid and self-pay.  [facility] [place] [address] [place] Phone: [phone]   [place], [place], [place] [phone], Ext. [phone] Mondays & Thursdays: 7-9 AM.  First 15 patients are seen on a first come, first serve basis.    [place] Providers:  Organization         Address  Phone   Notes  [facility] [address], [place] [phone] Also accepts self-pay patients.  [facility] [address], [place]  [phone]   [place], [address], [place] [phone]   [facility] [address], [place] [phone]   Lucianne Lei [address], [place]   [phone] Only accepts [place] Access [place] patients after they have their name applied to their card.   Self-Pay (no insurance) in [facility]:  Organization         Address  Phone   Notes  Sickle Cell Patients, [facility] Internal Medicine [place] [phone]   [facility] [place] [phone]   [facility] [place]  [place], [place], [place] [phone]   [facility]/Dr. Osei-Bonsu  [address] [place] or [address], [place] [phone] Phone number for both [place] and [place] locations is the same.  [facility]gent [facility] and [facility] [address] [place] [phone]   [facility] [place] or [place]  [facility] [address] [phone]   [facility] [place] [phone], phone; [phone], fax Sees patients 1st and 3rd Saturday of every month.  Must not qualify for public or private insurance (i.e. Medicaid, Medicare, [place] Health Choice, Veterans' Benefits)  Household income should be no more than 200% of the poverty level The clinic cannot treat you if you are pregnant or think you are pregnant  Sexually transmitted diseases are not treated at the clinic.    Dental Care: Organization         Address  Phone  Notes  [facility] Department of [facility] [place] [phone] Accepts children up to age 4 who are enrolled in [place] or [place]; pregnant women with a Medicaid card; and children who have applied for Medicaid or [place] Health Choice, but were declined, whose parents can pay a reduced fee at time of service.  [facility] Department of [facility]  [address], [place] [phone] Accepts children up to age 37 who are enrolled in [place] or [place]; pregnant women with a Medicaid card; and children who have applied for Medicaid or [place] Health Choice, but were declined, whose parents can pay a reduced fee at time of service.  [place] Adult [facility] Access PROGRAM  [place] [phone] Patients are seen by appointment only. Walk-ins are not accepted. [place] will see patients 68 years of age and older. Monday - Tuesday (8am-5pm) Most Wednesdays (8:30-5pm) $30 per visit, cash only  [facility] Adult Dental Access PROGRAM  [address], [facility] [phone] Patients are seen by appointment only. Walk-ins are not accepted. [place] will see patients 56 years of age and older. One Wednesday Evening (Monthly: Volunteer Based).  $30 per visit, cash only  [place]  [phone] for adults; Children under age 44, call Graduate Pediatric Dentistry at [phone]. Children aged 20-14, please call [phone] to request a pediatric application.  Dental services are provided in all areas of dental care including fillings, crowns and bridges, complete and partial dentures, implants, gum treatment, root canals, and extractions. Preventive care is also provided. Treatment is provided to both adults and children. Patients are selected via a lottery and there is often a waiting list.   [facility] [address] [place]  [phone] [email or web address]   [facility] [address] [place], [place] [phone], Ext. [phone] Second and Fourth Thursday of each month, opens at 6:30 AM; Clinic ends at 9 AM.  Patients are seen on a first-come first-served basis, and a limited number are seen during each clinic.   [facility]  [address] Hillard Danker [place], [place] [phone]   Eligibility Requirements You must have lived in [place], [place], or [place] counties for at least the last three months.   You cannot be eligible for state or federal sponsored [employer], including [employer], [place], or [employer].   You generally cannot be eligible for healthcare insurance through your employer.    How to apply: Eligibility screenings are held every Tuesday and Wednesday afternoon from 1:00 pm until 4:00 pm. You do not need an appointment for the interview!  [facility] [address], [place], [place]   [place]  [place]  Department  [phone]   [place]  [phone]    Behavioral Health Resources in the Community: Intensive Outpatient Programs Organization         Address  Phone  Notes  [place]. [address] [place], [place] [phone]    [facility] Outpatient [address], [place], [place]   ADS: Alcohol & Drug S[facility]cs [address] [place], [place]   [place] [address]  [place], [place] or [phone]   Substance Abuse Resources Organization         Address  Phone  Notes  Alcohol and Drug Services  [phone]   [place]  [phone]   [facility] [place]   Chinita Pester  [phone]   Residential & Outpatient Substance Abuse Program  [phone]   Psychological Services Organization         Address  Phone  Notes  [facility] [place]  [place]  [phone]   [place] [address], [place] or [phone]    Mobile Crisis Teams Organization         Address  Phone  Notes  [facility], Mobile Crisis Care Unit  [phone]   [facility]  [address] [place], [place]   [place] [address], [place] [phone]    Self-Help/Support Groups Organization         Address  Phone             Notes  [place]. of [place] - variety of support groups  [place] Call for more information  Narcotics Anonymous (NA), [facility] [address], [employer] [place]  2 meetings at this location   Special educational needs teacher         Address  Phone  Notes  [facility] [place],    [place]  [phone]   [facility]  [address] [place] [address], [place], [place]   [place], [place] [phone] Admissions: 8am-3pm M-F  Incentives Substance [place] [address]    [place], [place] [phone]   [facility] [address] [place], [place], [place]   [facility] [address],  [place],  [place]   [facility] - Intensive Outpatient [address] Kristeen Mans [address], [place], [place]   [facility] ([place].,  [place], [place] [phone] or [phone]   [facility] Services (RTS) [address] [place], [place] Accepts Medicaid  [facility] [place] [address]  [place] [phone] Substance Abuse/Addiction Treatment   [facility] Organization         Address  Phone  Notes  [facility]  [phone]   Domenic Schwab, PhD [address] Arlis Porta [place], [place]   [phone] or [phone]   [place], [place] [phone]   [place] [address] [place], [place] [phone] Insurance/Medicaid/sponsorship through [employer] and Families [address], [address]  [place], [place] [phone] [place], [place] [phone]    Dr. Adele Schilder  [phone]   [facility] [facility] 1) [address] [place] 2) [place] 3)  [address], [place] [phone]  [phone]   [place] [phone] or [phone] (After Hours)

## 2014-02-01 NOTE — Patient Instructions (Signed)
You need to go to the ER to be evaluated. Your blood pressure is very elevated and you're having vision and balance issues.  Please go immediately to [facility]. I will call the charge nurse there to let them know you're on your way.  Please follow up with [place] once you are out of the hospital so we can see how you're doing after the hospital and monitor your blood pressure.

## 2014-02-01 NOTE — ED Notes (Signed)
Pt reports having headaches for extended amount of time, began having dizziness and blurred vision on Sunday, went to an ucc today and sent here due to bp 220/120. Pt has hx of htn but hasnt been taking his meds x 3 years.

## 2014-02-01 NOTE — Progress Notes (Signed)
Subjective:    Patient ID: [name], male    DOB: [birth year], 49 y.o.   MRN: [id number]  PCP: No primary care provider on file.  Chief Complaint  Patient presents with  . Headache  . Dizziness   Patient Active Problem List   Diagnosis Date Noted  . Malignant neoplasm of rectum 09/02/2007   Prior to Admission medications   Medication Sig Start Date End Date Taking? Authorizing Provider  aspirin-acetaminophen-caffeine (EXCEDRIN MIGRAINE) [phone] MG per tablet Take 1 tablet by mouth every 6 (six) hours as needed for headache.   Yes Historical Provider, MD   Medications, allergies, past medical history, surgical history, family history, social history and problem list reviewed and updated.  HPI  62 yom with pmh htn, dm2 presents with vision changes, dizziness.  He has h/o bitemporal has for many yrs. Excedrin migraine has always worked for these. For past couple wks bitemporal HAs have increased in freq, excedrin migraine has not helped with relief during this time. Three days ago had a bitemporal HA which then led to blurry vision left eye and double vision both eyes. This lasted several mins. Since then he has had 3-4 more episodes of double vision along with dizziness. These episodes last several mins and resolve. No assoc palps. No syncope.  Vincenza Hews had one episode left sided non exertional cp. While lying down. Lasted approx 5 mins and resolved. No radiation. Denies exertional cp, sob, doe.   Denies hematuria, has occasional dysuria.   He also states was diag with dm2 last year. Not on any meds. Denies polyuria, polydipsia.   Review of Systems No fever, chills. See HPI.     Objective:   Physical Exam  Constitutional: He is oriented to person, place, and time. He appears well-developed and well-nourished.  Non-toxic appearance. He does not have a sickly appearance. He does not appear ill. No distress.  BP 220/120 mmHg  Pulse 81  Temp(Src) 98.4 F (36.9 C) (Oral)   Resp 18  Ht 5\' 5"  (1.651 m)  Wt 224 lb 6.4 oz (101.787 kg)  BMI 37.34 kg/m2  SpO2 99%   Eyes: Conjunctivae and EOM are normal. Pupils are equal, round, and reactive to light.  Cardiovascular: Normal rate, regular rhythm and normal heart sounds.  Exam reveals no gallop.   No murmur heard. Pulmonary/Chest: Effort normal and breath sounds normal. He has no decreased breath sounds. He has no wheezes. He has no rhonchi. He has no rales.  Neurological: He is alert and oriented to person, place, and time.  Psychiatric: He has a normal mood and affect. His speech is normal.   EKG read by Dr Lorelei Pont. Findings: Normal. No acute ST/T wave changes.   BP recheck: 208/134     Assessment & Plan:   43 yom with pmh htn, dm2 presents with vision changes, dizziness.  Hypertensive urgency Chest pain, unspecified chest pain type - Plan: EKG 12-Lead Vision changes --sx concerning for hypertensive urgency with elevated bp x2, double vision, blurry vision, and dizziness --to [place] ed, pt wishes to drive there, currently asx --called and spoke with [place] charge rn to inform of pts pending arrival --cp episode not concerning for acute mi as was non exertional, resolved after few mins, and normal ekg today  --f/u with [place] one week after er/hospital for bp management  Type 2 diabetes mellitus without complication --no workup today --pt encouraged to f/u with [place] in one week for further workup after er/hospital  Julieta Gutting, PA-C Physician Assistant-Certified [facility]  02/01/2014 2:19 PM

## 2014-02-01 NOTE — ED Provider Notes (Signed)
TIME SEEN: 4:00 PM  CHIEF COMPLAINT: Hypertension, headaches, blurry vision, dizziness  HPI: Pt is a 49 y.o. M with history of hypertension who has been off medication for the past 4-5 years, prediabetes who presents to the emergency department with complaints of several weeks of intermittent diffuse throbbing headaches, blurry vision, lightheadedness. Last episode was 2 days ago. States he is completely asymptomatic now. No chest pain or shortness of breath. No numbness, tingling or focal weakness.  States he thinks he was on amlodipine and another medication but he cannot remember the name. Reports that he stopped taking his medications several years ago because he lost his insurance. His primary care physician used to be Dr.  Rockwell Germany with [facility] physicians.  Went to urgent care today who sent him to ED because of his BP.   ROS: See HPI Constitutional: no fever  Eyes: no drainage  ENT: no runny nose   Cardiovascular:  no chest pain  Resp: no SOB  GI: no vomiting GU: no dysuria Integumentary: no rash  Allergy: no hives  Musculoskeletal: no leg swelling  Neurological: no slurred speech ROS otherwise negative  PAST MEDICAL HISTORY/PAST SURGICAL HISTORY:  Past Medical History  Diagnosis Date  . Diabetes mellitus without complication   . Hypertension     MEDICATIONS:  Prior to Admission medications   Medication Sig Start Date End Date Taking? Authorizing Provider  aspirin-acetaminophen-caffeine (EXCEDRIN MIGRAINE) [phone] MG per tablet Take 1 tablet by mouth every 6 (six) hours as needed for headache.   Yes Historical Provider, MD    ALLERGIES:  No Known Allergies  SOCIAL HISTORY:  History  Substance Use Topics  . Smoking status: Never Smoker   . Smokeless tobacco: Not on file  . Alcohol Use: 1.8 oz/week    1 Glasses of wine, 2 Cans of beer, 0 Not specified per week    FAMILY HISTORY: Family History  Problem Relation Age of Onset  . Hypertension Mother   . Heart disease  Mother   . Hypertension Sister   . Hypertension Brother     EXAM: BP 207/120 mmHg  Pulse 91  Temp(Src) 97.9 F (36.6 C) (Oral)  Resp 16  SpO2 99% CONSTITUTIONAL: Alert and oriented and responds appropriately to questions. Well-appearing; well-nourished HEAD: Normocephalic EYES: Conjunctivae clear, PERRL ENT: normal nose; no rhinorrhea; moist mucous membranes; pharynx without lesions noted NECK: Supple, no meningismus, no LAD  CARD: RRR; S1 and S2 appreciated; no murmurs, no clicks, no rubs, no gallops RESP: Normal chest excursion without splinting or tachypnea; breath sounds clear and equal bilaterally; no wheezes, no rhonchi, no rales,  ABD/GI: Normal bowel sounds; non-distended; soft, non-tender, no rebound, no guarding BACK:  The back appears normal and is non-tender to palpation, there is no CVA tenderness EXT: Normal ROM in all joints; non-tender to palpation; no edema; normal capillary refill; no cyanosis    SKIN: Normal color for age and race; warm NEURO: Moves all extremities equally; strength 5/5 in all 4 choice, sensation to light touch intact diffusely, cranial nerves II through XII intact, no dysmetria to finger-nose testing bilaterally PSYCH: The patient's mood and manner are appropriate. Grooming and personal hygiene are appropriate.  MEDICAL DECISION MAKING: Pt is a 49 y.o. male who presents with asymptomatic hypertension. Will check basic labs, urine, EKG 2 to evaluate for signs of end organ damage given he has had symptoms intermittently. Will restart on amlodipine and hydrochlorothiazide. Have advised him that he will need to follow-up closely with a primary care  physician. He verbalizes understanding and is comfortable with this plan. Blood pressure when I'm in the room was 190/122.  ED PROGRESS: Pressure has slowly improved with oral medications. Creatinine is normal. EKG shows no significant change since 2002. No pertinent earache, hematuria. No signs of end organ  damage. We'll discharge home on amlodipine 10 mg once daily and hydrochlorothiazide 25 mg once daily. Have advised him to follow-up with her primary care physician. He was previously followed with [facility] physicians. Discussed strict return precautions. He verbalizes understanding and is comfortable with plan.     EKG Interpretation  Date/Time:  Wednesday February 01 2014 16:35:35 EST Ventricular Rate:  86 PR Interval:  140 QRS Duration: 86 QT Interval:  379 QTC Calculation: 453 R Axis:   46 Text Interpretation:  Sinus rhythm ST elev, probable normal early repol pattern No significant change since 2002 Confirmed by Alianys Chacko,  DO, Sydelle Sherfield [phone]) on 02/01/2014 4:40:12 PM        [place], DO 02/01/14 1933

## 2014-02-10 ENCOUNTER — Telehealth: Payer: Self-pay

## 2014-02-10 NOTE — Telephone Encounter (Signed)
Pt is needing a work note related to the last office visit

## 2014-02-10 NOTE — Telephone Encounter (Signed)
That is fine 

## 2014-02-10 NOTE — Telephone Encounter (Signed)
Spoke with [name], he was seen on 1/20 and he needs a note for Thurs, Fri, and Sat. He states his headaches didn't go away until Sat. Is it ok to write a note for these days? Please advise.

## 2014-02-11 NOTE — Telephone Encounter (Signed)
Note ready for p/u. Pt notified.

## 2014-07-21 ENCOUNTER — Emergency Department (HOSPITAL_COMMUNITY): Payer: BLUE CROSS/BLUE SHIELD

## 2014-07-21 ENCOUNTER — Encounter (HOSPITAL_COMMUNITY): Payer: Self-pay

## 2014-07-21 ENCOUNTER — Inpatient Hospital Stay (HOSPITAL_COMMUNITY)
Admission: EM | Admit: 2014-07-21 | Discharge: 2014-07-27 | DRG: 070 | Disposition: A | Discharge status: Home / Self Care | Payer: BLUE CROSS/BLUE SHIELD | Attending: Internal Medicine | Admitting: Internal Medicine

## 2014-07-21 DIAGNOSIS — R739 Hyperglycemia, unspecified: Secondary | ICD-10-CM | POA: Diagnosis present

## 2014-07-21 DIAGNOSIS — R569 Unspecified convulsions: Secondary | ICD-10-CM

## 2014-07-21 DIAGNOSIS — Z85048 Personal history of other malignant neoplasm of rectum, rectosigmoid junction, and anus: Secondary | ICD-10-CM

## 2014-07-21 DIAGNOSIS — Z9114 Patient's other noncompliance with medication regimen: Secondary | ICD-10-CM | POA: Diagnosis present

## 2014-07-21 DIAGNOSIS — E1159 Type 2 diabetes mellitus with other circulatory complications: Secondary | ICD-10-CM | POA: Diagnosis present

## 2014-07-21 DIAGNOSIS — Z923 Personal history of irradiation: Secondary | ICD-10-CM

## 2014-07-21 DIAGNOSIS — Z9889 Other specified postprocedural states: Secondary | ICD-10-CM

## 2014-07-21 DIAGNOSIS — I10 Essential (primary) hypertension: Secondary | ICD-10-CM | POA: Diagnosis present

## 2014-07-21 DIAGNOSIS — Z87828 Personal history of other (healed) physical injury and trauma: Secondary | ICD-10-CM

## 2014-07-21 DIAGNOSIS — G9389 Other specified disorders of brain: Secondary | ICD-10-CM | POA: Diagnosis not present

## 2014-07-21 DIAGNOSIS — G4089 Other seizures: Secondary | ICD-10-CM | POA: Diagnosis present

## 2014-07-21 DIAGNOSIS — E6609 Other obesity due to excess calories: Secondary | ICD-10-CM | POA: Diagnosis present

## 2014-07-21 DIAGNOSIS — E876 Hypokalemia: Secondary | ICD-10-CM | POA: Diagnosis not present

## 2014-07-21 DIAGNOSIS — Q2730 Arteriovenous malformation, site unspecified: Secondary | ICD-10-CM

## 2014-07-21 DIAGNOSIS — Z23 Encounter for immunization: Secondary | ICD-10-CM

## 2014-07-21 DIAGNOSIS — R32 Unspecified urinary incontinence: Secondary | ICD-10-CM | POA: Diagnosis not present

## 2014-07-21 DIAGNOSIS — R0602 Shortness of breath: Secondary | ICD-10-CM

## 2014-07-21 DIAGNOSIS — E870 Hyperosmolality and hypernatremia: Secondary | ICD-10-CM | POA: Diagnosis present

## 2014-07-21 DIAGNOSIS — F419 Anxiety disorder, unspecified: Secondary | ICD-10-CM | POA: Diagnosis present

## 2014-07-21 DIAGNOSIS — J9602 Acute respiratory failure with hypercapnia: Secondary | ICD-10-CM | POA: Insufficient documentation

## 2014-07-21 DIAGNOSIS — C20 Malignant neoplasm of rectum: Secondary | ICD-10-CM | POA: Diagnosis present

## 2014-07-21 DIAGNOSIS — E1110 Type 2 diabetes mellitus with ketoacidosis without coma: Secondary | ICD-10-CM | POA: Diagnosis present

## 2014-07-21 DIAGNOSIS — Z6832 Body mass index (BMI) 32.0-32.9, adult: Secondary | ICD-10-CM

## 2014-07-21 DIAGNOSIS — N179 Acute kidney failure, unspecified: Secondary | ICD-10-CM | POA: Diagnosis present

## 2014-07-21 DIAGNOSIS — R509 Fever, unspecified: Secondary | ICD-10-CM | POA: Diagnosis not present

## 2014-07-21 DIAGNOSIS — Z96642 Presence of left artificial hip joint: Secondary | ICD-10-CM | POA: Diagnosis present

## 2014-07-21 DIAGNOSIS — J9690 Respiratory failure, unspecified, unspecified whether with hypoxia or hypercapnia: Secondary | ICD-10-CM

## 2014-07-21 DIAGNOSIS — E1165 Type 2 diabetes mellitus with hyperglycemia: Secondary | ICD-10-CM | POA: Diagnosis present

## 2014-07-21 DIAGNOSIS — E1310 Other specified diabetes mellitus with ketoacidosis without coma: Secondary | ICD-10-CM | POA: Diagnosis present

## 2014-07-21 DIAGNOSIS — G936 Cerebral edema: Secondary | ICD-10-CM | POA: Diagnosis present

## 2014-07-21 LAB — COMPREHENSIVE METABOLIC PANEL
ALK PHOS: 151 U/L — AB (ref 38–126)
ALT: 44 U/L (ref 17–63)
AST: 32 U/L (ref 15–41)
Albumin: 4.1 g/dL (ref 3.5–5.0)
Anion gap: 19 — ABNORMAL HIGH (ref 5–15)
BUN: 19 mg/dL (ref 6–20)
CO2: 16 mmol/L — AB (ref 22–32)
Calcium: 9.2 mg/dL (ref 8.9–10.3)
Chloride: 94 mmol/L — ABNORMAL LOW (ref 101–111)
Creatinine, Ser: 2 mg/dL — ABNORMAL HIGH (ref 0.61–1.24)
GFR, EST AFRICAN AMERICAN: 44 mL/min — AB (ref >60)
GFR, EST NON AFRICAN AMERICAN: 38 mL/min — AB (ref >60)
Glucose, Bld: 1094 mg/dL (ref 65–99)
POTASSIUM: 5.7 mmol/L — AB (ref 3.5–5.1)
Sodium: 129 mmol/L — ABNORMAL LOW (ref 135–145)
TOTAL PROTEIN: 7.4 g/dL (ref 6.5–8.1)
Total Bilirubin: 0.7 mg/dL (ref 0.3–1.2)

## 2014-07-21 LAB — CBC WITH DIFFERENTIAL/PLATELET
BASOS ABS: 0 10*3/uL (ref 0.0–0.1)
BASOS PCT: 1 % (ref 0–1)
EOS PCT: 1 % (ref 0–5)
Eosinophils Absolute: 0 10*3/uL (ref 0.0–0.7)
HEMATOCRIT: 40.2 % (ref 39.0–52.0)
Hemoglobin: 14 g/dL (ref 13.0–17.0)
Lymphocytes Relative: 27 % (ref 12–46)
Lymphs Abs: 1.2 10*3/uL (ref 0.7–4.0)
MCH: 27.3 pg (ref 26.0–34.0)
MCHC: 34.8 g/dL (ref 30.0–36.0)
MCV: 78.5 fL (ref 78.0–100.0)
MONO ABS: 0.3 10*3/uL (ref 0.1–1.0)
Monocytes Relative: 7 % (ref 3–12)
NEUTROS PCT: 65 % (ref 43–77)
Neutro Abs: 2.9 10*3/uL (ref 1.7–7.7)
Platelets: 193 10*3/uL (ref 150–400)
RBC: 5.12 MIL/uL (ref 4.22–5.81)
RDW: 13.7 % (ref 11.5–15.5)
WBC: 4.4 10*3/uL (ref 4.0–10.5)

## 2014-07-21 LAB — I-STAT TROPONIN, ED: Troponin i, poc: 0.01 ng/mL (ref 0.00–0.08)

## 2014-07-21 LAB — CBG MONITORING, ED

## 2014-07-21 LAB — ETHANOL: Alcohol, Ethyl (B): <5 mg/dL (ref <5)

## 2014-07-21 MED ORDER — SODIUM CHLORIDE 0.9 % IV BOLUS (SEPSIS)
1000.0000 mL | INTRAVENOUS | Status: AC
  Administered 2014-07-21: 1000 mL via INTRAVENOUS

## 2014-07-21 MED ORDER — PIPERACILLIN-TAZOBACTAM 3.375 G IVPB 30 MIN
3.3750 g | Freq: Once | INTRAVENOUS | Status: AC
  Administered 2014-07-21: 3.375 g via INTRAVENOUS
  Filled 2014-07-21: qty 50

## 2014-07-21 MED ORDER — PIPERACILLIN-TAZOBACTAM 3.375 G IVPB
3.3750 g | Freq: Three times a day (TID) | INTRAVENOUS | Status: DC
  Filled 2014-07-21 (×2): qty 50

## 2014-07-21 MED ORDER — VANCOMYCIN HCL 10 G IV SOLR
1250.0000 mg | INTRAVENOUS | Status: DC
  Filled 2014-07-21: qty 1250

## 2014-07-21 MED ORDER — DEXTROSE-NACL 5-0.45 % IV SOLN: INTRAVENOUS | Status: DC

## 2014-07-21 MED ORDER — SODIUM CHLORIDE 0.9 % IV SOLN
1000.0000 mg | Freq: Once | INTRAVENOUS | Status: AC
  Administered 2014-07-21: 1000 mg via INTRAVENOUS
  Filled 2014-07-21: qty 10

## 2014-07-21 MED ORDER — SODIUM CHLORIDE 0.9 % IV SOLN
1750.0000 mg | Freq: Once | INTRAVENOUS | Status: AC
  Administered 2014-07-21: 1750 mg via INTRAVENOUS
  Filled 2014-07-21: qty 1750

## 2014-07-21 MED ORDER — SODIUM CHLORIDE 0.9 % IV SOLN
INTRAVENOUS | Status: DC
  Administered 2014-07-22: 5.4 [IU]/h via INTRAVENOUS
  Filled 2014-07-21 (×2): qty 2.5

## 2014-07-21 MED ORDER — LORAZEPAM 2 MG/ML IJ SOLN
INTRAMUSCULAR | Status: AC
Start: 2014-07-21 — End: 2014-07-21
  Administered 2014-07-21: 2 mg
  Filled 2014-07-21: qty 1

## 2014-07-21 MED ORDER — VANCOMYCIN HCL IN DEXTROSE 1-5 GM/200ML-% IV SOLN
1000.0000 mg | Freq: Once | INTRAVENOUS | Status: DC
  Filled 2014-07-21: qty 200

## 2014-07-21 MED ORDER — ALBUTEROL SULFATE (2.5 MG/3ML) 0.083% IN NEBU: 5.0000 mg | INHALATION_SOLUTION | Freq: Once | RESPIRATORY_TRACT | Status: DC

## 2014-07-21 NOTE — Progress Notes (Signed)
ANTIBIOTIC CONSULT NOTE - INITIAL  Pharmacy Consult for Vancomycin and Zosyn Indication: rule out sepsis  No Known Allergies  Patient Measurements:   Adjusted Body Weight:   Vital Signs:   Intake/Output from previous day:   Intake/Output from this shift:    Labs: No results for input(s): WBC, HGB, PLT, LABCREA, CREATININE in the last 72 hours. CrCl cannot be calculated (Unknown ideal weight.). No results for input(s): VANCOTROUGH, VANCOPEAK, VANCORANDOM, GENTTROUGH, GENTPEAK, GENTRANDOM, TOBRATROUGH, TOBRAPEAK, TOBRARND, AMIKACINPEAK, AMIKACINTROU, AMIKACIN in the last 72 hours.   Microbiology: No results found for this or any previous visit (from the past 720 hour(s)).  Medical History: Past Medical History  Diagnosis Date  . Diabetes mellitus without complication   . Hypertension     Medications:   (Not in a hospital admission) Scheduled:   Infusions:  . levETIRAcetam    . piperacillin-tazobactam    . sodium chloride    . vancomycin     Assessment: 49yo male with history of DM2 and HTN presents with seizures. Pharmacy is consulted to dose vancomycin and zosyn for suspected sepsis. Pt is febrile to 100.4, WBC 4.4, sCr 2.  Goal of Therapy:  Vancomycin trough level 15-20 mcg/ml  Plan:  Vancomycin 1750mg  IV load followed by 1250mg  q24h Zosyn 3.375 IV q8h Measure antibiotic drug levels at steady state Follow up culture results, renal function and clinical course  Andrey Cota. Diona Foley, PharmD Clinical Pharmacist Pager [phone] 07/21/2014,9:06 PM

## 2014-07-21 NOTE — ED Provider Notes (Signed)
CSN: [id number]     Arrival date & time 07/21/14  2033 History   First MD Initiated Contact with Patient 07/21/14 2037     Chief Complaint  Patient presents with  . Seizures     (Consider location/radiation/quality/duration/timing/severity/associated sxs/prior Treatment) Patient is a 49 y.o. male presenting with seizures. The history is provided by the patient.  Seizures Seizure activity on arrival: yes   Seizure type:  Grand mal Preceding symptoms comment:  Unusual sound Initial focality:  Unable to specify Episode characteristics: generalized shaking   Postictal symptoms: somnolence   Return to baseline: no   Severity:  Mild Duration:  3 minutes Timing:  Once Number of seizures this episode:  2 Progression:  Unchanged Context comment:  Unknown Recent head injury:  No recent head injuries PTA treatment:  None History of seizures: no     Past Medical History  Diagnosis Date  . Diabetes mellitus without complication   . Hypertension    Past Surgical History  Procedure Laterality Date  . Joint replacement Left 2004    Left hip   Family History  Problem Relation Age of Onset  . Hypertension Mother   . Heart disease Mother   . Hypertension Sister   . Hypertension Brother    History  Substance Use Topics  . Smoking status: Never Smoker   . Smokeless tobacco: Not on file  . Alcohol Use: 1.8 oz/week    1 Glasses of wine, 2 Cans of beer, 0 Standard drinks or equivalent per week    Review of Systems  Constitutional: Negative for fever.  HENT: Negative for drooling and rhinorrhea.   Eyes: Negative for pain.  Respiratory: Positive for cough. Negative for shortness of breath.   Cardiovascular: Negative for chest pain and leg swelling.  Gastrointestinal: Negative for nausea, vomiting, abdominal pain and diarrhea.  Endocrine: Positive for polydipsia.  Genitourinary: Positive for frequency. Negative for dysuria and hematuria.  Musculoskeletal: Negative for gait problem  and neck pain.  Skin: Negative for color change.  Neurological: Positive for seizures and headaches. Negative for numbness.  Hematological: Negative for adenopathy.  Psychiatric/Behavioral: Negative for behavioral problems.  All other systems reviewed and are negative.     Allergies  Review of patient's allergies indicates no known allergies.  Home Medications   Prior to Admission medications   Medication Sig Start Date End Date Taking? Authorizing Provider  amLODipine (NORVASC) 10 MG tablet Take 1 tablet (10 mg total) by mouth daily. 02/01/14   Kristen N Ward, DO  aspirin-acetaminophen-caffeine (EXCEDRIN MIGRAINE) [id number] MG per tablet Take 1 tablet by mouth every 6 (six) hours as needed for headache.    Historical Provider, MD  hydrochlorothiazide (HYDRODIURIL) 25 MG tablet Take 1 tablet (25 mg total) by mouth daily. 02/01/14   [place], DO   There were no vitals taken for this visit. Physical Exam  Constitutional: He appears well-developed and well-nourished.  HENT:  Head: Normocephalic and atraumatic.  Right Ear: External ear normal.  Left Ear: External ear normal.  Nose: Nose normal.  Mouth/Throat: Oropharynx is clear and moist. No oropharyngeal exudate.  Eyes: Conjunctivae and EOM are normal. Pupils are equal, round, and reactive to light.  Neck: Normal range of motion. Neck supple.  Cardiovascular: Regular rhythm, normal heart sounds and intact distal pulses.  Exam reveals no gallop and no friction rub.   No murmur heard. HR 110  Pulmonary/Chest: He has wheezes (mild wheezing heard bilaterally.).  Mild tachypnea.  Abdominal: Soft. Bowel sounds are  normal. He exhibits no distension. There is no tenderness. There is no rebound and no guarding.  Musculoskeletal: Normal range of motion. He exhibits no edema or tenderness.  Neurological: GCS eye subscore is 2. GCS verbal subscore is 3. GCS motor subscore is 5.  Very drowsy on exam but will arouse with stimulation.  Will not answer questions or follow commands on initial exam.  Skin: Skin is warm and dry.  Psychiatric: He has a normal mood and affect. His behavior is normal.  Nursing note and vitals reviewed.   ED Course  Procedures (including critical care time) Labs Review Labs Reviewed  COMPREHENSIVE METABOLIC PANEL - Abnormal; Notable for the following:    Sodium 129 (*)    Potassium 5.7 (*)    Chloride 94 (*)    CO2 16 (*)    Glucose, Bld 1094 (*)    Creatinine, Ser 2.00 (*)    Alkaline Phosphatase 151 (*)    GFR calc non Af Amer 38 (*)    GFR calc Af Amer 44 (*)    Anion gap 19 (*)    All other components within normal limits  URINALYSIS, ROUTINE W REFLEX MICROSCOPIC (NOT AT [facility]) - Abnormal; Notable for the following:    Glucose, UA >1000 (*)    All other components within normal limits  BASIC METABOLIC PANEL - Abnormal; Notable for the following:    Chloride 113 (*)    Glucose, Bld 520 (*)    Creatinine, Ser 1.53 (*)    GFR calc non Af Amer 52 (*)    All other components within normal limits  CBC - Abnormal; Notable for the following:    Hemoglobin 12.9 (*)    HCT 37.0 (*)    MCV 77.1 (*)    All other components within normal limits  BASIC METABOLIC PANEL - Abnormal; Notable for the following:    CO2 19 (*)    Glucose, Bld 773 (*)    Creatinine, Ser 1.56 (*)    GFR calc non Af Amer 51 (*)    GFR calc Af Amer 59 (*)    All other components within normal limits  BASIC METABOLIC PANEL - Abnormal; Notable for the following:    Sodium 150 (*)    Chloride 119 (*)    Glucose, Bld 193 (*)    Creatinine, Ser 1.54 (*)    GFR calc non Af Amer 52 (*)    GFR calc Af Amer 60 (*)    All other components within normal limits  CBC - Abnormal; Notable for the following:    MCV 76.9 (*)    All other components within normal limits  LACTIC ACID, PLASMA - Abnormal; Notable for the following:    Lactic Acid, Venous 3.0 (*)    All other components within normal limits  GLUCOSE,  CAPILLARY - Abnormal; Notable for the following:    Glucose-Capillary 473 (*)    All other components within normal limits  GLUCOSE, CAPILLARY - Abnormal; Notable for the following:    Glucose-Capillary 301 (*)    All other components within normal limits  GLUCOSE, CAPILLARY - Abnormal; Notable for the following:    Glucose-Capillary 269 (*)    All other components within normal limits  GLUCOSE, CAPILLARY - Abnormal; Notable for the following:    Glucose-Capillary 199 (*)    All other components within normal limits  GLUCOSE, CAPILLARY - Abnormal; Notable for the following:    Glucose-Capillary 161 (*)    All other  components within normal limits  CBG MONITORING, ED - Abnormal; Notable for the following:    Glucose-Capillary >600 (*)    All other components within normal limits  I-STAT CG4 LACTIC ACID, ED - Abnormal; Notable for the following:    Lactic Acid, Venous 2.45 (*)    All other components within normal limits  I-STAT VENOUS BLOOD GAS, ED - Abnormal; Notable for the following:    pCO2, Ven 53.0 (*)    pO2, Ven 29.0 (*)    Acid-base deficit 4.0 (*)    All other components within normal limits  CBG MONITORING, ED - Abnormal; Notable for the following:    Glucose-Capillary >600 (*)    All other components within normal limits  POCT I-STAT 3, ART BLOOD GAS (G3+) - Abnormal; Notable for the following:    pH, Arterial 7.291 (*)    pO2, Arterial 262.0 (*)    Bicarbonate 19.9 (*)    Acid-base deficit 6.0 (*)    All other components within normal limits  CULTURE, BLOOD (ROUTINE X 2)  CULTURE, BLOOD (ROUTINE X 2)  MRSA PCR SCREENING  URINE CULTURE  CBC WITH DIFFERENTIAL/PLATELET  URINE RAPID DRUG SCREEN, HOSP PERFORMED  ETHANOL  URINE MICROSCOPIC-ADD ON  PROCALCITONIN  BASIC METABOLIC PANEL  I-STAT TROPOININ, ED  I-STAT CG4 LACTIC ACID, ED    Imaging Review Dg Chest Port 1 View  07/21/2014   CLINICAL DATA:  New onset seizure.  EXAM: PORTABLE CHEST - 1 VIEW   COMPARISON:  None.  FINDINGS: The cardiomediastinal silhouette is unremarkable.  Mild bibasilar opacities are noted -favor atelectasis over airspace disease.  There is no evidence of pulmonary edema, suspicious pulmonary nodule/mass, pleural effusion, or pneumothorax. No acute bony abnormalities are identified.  IMPRESSION: Mild bibasilar opacities -favor atelectasis over airspace disease.   Electronically Signed   By: Margarette Canada M.D.   On: 07/21/2014 21:18     EKG Interpretation   Date/Time:  Friday July 21 2014 20:52:08 EDT Ventricular Rate:  111 PR Interval:  148 QRS Duration: 95 QT Interval:  386 QTC Calculation: 525 R Axis:   50 Text Interpretation:  Sinus tachycardia Prolonged QT interval Otherwise no  significant change Confirmed by Zubin Pontillo  MD, Koren Sermersheim ([id number]) on 07/21/2014  9:12:29 PM     CRITICAL CARE Performed by: Pamella Pert, S Total critical care time: 45 min Critical care time was exclusive of separately billable procedures and treating other patients. Critical care was necessary to treat or prevent imminent or life-threatening deterioration. Critical care was time spent personally by me on the following activities: development of treatment plan with patient and/or surrogate as well as nursing, discussions with consultants, evaluation of patient's response to treatment, examination of patient, obtaining history from patient or surrogate, ordering and performing treatments and interventions, ordering and review of laboratory studies, ordering and review of radiographic studies, pulse oximetry and re-evaluation of patient's condition.  MDM   Final diagnoses:  Seizure  Fever, unspecified fever cause  Diabetic ketoacidosis associated with other specified diabetes mellitus  Required emergent intubation    9:05 PM 49 y.o. male with a history of hypertension but not currently treated and rectal cancer status post surgery and radiation in 2009 who presents with new onset  seizure which occurred between 7:30 and 8 PM this evening. His wife states that he had been sitting on the couch stricken Kool-Aid. He complained of hearing and unusual sound in his ears. He had a diffuse tonic-clonic seizure lasting about 3-4 minutes which  resolved spontaneously. He appeared somnolent afterwards. While pushing a stretcher and to the hospital he had another tonic-clonic seizure lasting about 1 minute per EMS. He appears postictal on exam. Mildly tachycardic with a heart rate of 110, tachypneic, and febrile with a temperature 100.4. His wife notes that he has had a chronic dry cough. He is also had some mild intermittent headaches which seem to resolve with typical treatment. He has not been febrile at home. She states that he has had increased thirst and urinary frequency over the last 1-2 months. Lactic acid not ordered with initial workup as pt had just had a seizure and I felt like we would likely get a spurious value that would not help direct his care.   No hx of seizures. Given fever, tachycardia, tachypnea and new onset seizure, will cover for sepsis. Initial GCS of 10, pt likely post-ictal.   9:36 PM the patient had another seizure lasting 1-2 minutes. He got 2 mg of Ativan. Keppra is being hung now. (3rd seizure today)  Vasogenic edema on CT. Neuro consulted for eval. Does not think pt needs LP. He is more alert on exam and will answer basic questions but very restless and agitated. Glucostabilizer ordered for hyperglycemia.   CC documented in this pt w/ DKA and waxing/waning mental status requiring frequent repeat evaluations and ativan for seizures and sedation d/t agitation/restlessness.   Pamella Pert, MD 07/22/14 [phone]

## 2014-07-21 NOTE — ED Notes (Signed)
Per EMS: Pt had a witnessed seizure at home. Family states it lasted ~64min. No history of same. EMS reports CBG = 500+ BP = 185/90, 120bpm. Upon arrival, pt seized again. No emesis, + urinary incontinence.

## 2014-07-22 ENCOUNTER — Inpatient Hospital Stay (HOSPITAL_COMMUNITY): Payer: BLUE CROSS/BLUE SHIELD

## 2014-07-22 ENCOUNTER — Emergency Department (HOSPITAL_COMMUNITY): Payer: BLUE CROSS/BLUE SHIELD

## 2014-07-22 DIAGNOSIS — Z87828 Personal history of other (healed) physical injury and trauma: Secondary | ICD-10-CM | POA: Diagnosis not present

## 2014-07-22 DIAGNOSIS — E1310 Other specified diabetes mellitus with ketoacidosis without coma: Secondary | ICD-10-CM

## 2014-07-22 DIAGNOSIS — F419 Anxiety disorder, unspecified: Secondary | ICD-10-CM | POA: Diagnosis present

## 2014-07-22 DIAGNOSIS — C20 Malignant neoplasm of rectum: Secondary | ICD-10-CM

## 2014-07-22 DIAGNOSIS — N179 Acute kidney failure, unspecified: Secondary | ICD-10-CM | POA: Diagnosis present

## 2014-07-22 DIAGNOSIS — I10 Essential (primary) hypertension: Secondary | ICD-10-CM

## 2014-07-22 DIAGNOSIS — R739 Hyperglycemia, unspecified: Secondary | ICD-10-CM | POA: Diagnosis not present

## 2014-07-22 DIAGNOSIS — G9389 Other specified disorders of brain: Secondary | ICD-10-CM | POA: Diagnosis present

## 2014-07-22 DIAGNOSIS — E1159 Type 2 diabetes mellitus with other circulatory complications: Secondary | ICD-10-CM | POA: Diagnosis present

## 2014-07-22 DIAGNOSIS — R569 Unspecified convulsions: Secondary | ICD-10-CM

## 2014-07-22 DIAGNOSIS — E1110 Type 2 diabetes mellitus with ketoacidosis without coma: Secondary | ICD-10-CM | POA: Diagnosis present

## 2014-07-22 DIAGNOSIS — G4089 Other seizures: Secondary | ICD-10-CM | POA: Diagnosis present

## 2014-07-22 DIAGNOSIS — R509 Fever, unspecified: Secondary | ICD-10-CM | POA: Insufficient documentation

## 2014-07-22 DIAGNOSIS — E876 Hypokalemia: Secondary | ICD-10-CM | POA: Diagnosis not present

## 2014-07-22 DIAGNOSIS — R32 Unspecified urinary incontinence: Secondary | ICD-10-CM | POA: Diagnosis not present

## 2014-07-22 DIAGNOSIS — G936 Cerebral edema: Secondary | ICD-10-CM | POA: Diagnosis present

## 2014-07-22 DIAGNOSIS — E1101 Type 2 diabetes mellitus with hyperosmolarity with coma: Secondary | ICD-10-CM | POA: Diagnosis not present

## 2014-07-22 DIAGNOSIS — J9602 Acute respiratory failure with hypercapnia: Secondary | ICD-10-CM | POA: Diagnosis not present

## 2014-07-22 DIAGNOSIS — Z23 Encounter for immunization: Secondary | ICD-10-CM | POA: Diagnosis not present

## 2014-07-22 DIAGNOSIS — Z923 Personal history of irradiation: Secondary | ICD-10-CM | POA: Diagnosis not present

## 2014-07-22 DIAGNOSIS — Z96642 Presence of left artificial hip joint: Secondary | ICD-10-CM | POA: Diagnosis present

## 2014-07-22 DIAGNOSIS — E1165 Type 2 diabetes mellitus with hyperglycemia: Secondary | ICD-10-CM | POA: Diagnosis present

## 2014-07-22 DIAGNOSIS — E6609 Other obesity due to excess calories: Secondary | ICD-10-CM | POA: Diagnosis present

## 2014-07-22 DIAGNOSIS — E870 Hyperosmolality and hypernatremia: Secondary | ICD-10-CM | POA: Diagnosis present

## 2014-07-22 DIAGNOSIS — Z85048 Personal history of other malignant neoplasm of rectum, rectosigmoid junction, and anus: Secondary | ICD-10-CM | POA: Diagnosis not present

## 2014-07-22 DIAGNOSIS — G939 Disorder of brain, unspecified: Secondary | ICD-10-CM | POA: Diagnosis not present

## 2014-07-22 DIAGNOSIS — Z9114 Patient's other noncompliance with medication regimen: Secondary | ICD-10-CM | POA: Diagnosis present

## 2014-07-22 DIAGNOSIS — Z6832 Body mass index (BMI) 32.0-32.9, adult: Secondary | ICD-10-CM | POA: Diagnosis not present

## 2014-07-22 LAB — CBC
HCT: 39 % (ref 39.0–52.0)
HEMATOCRIT: 37 % — AB (ref 39.0–52.0)
HEMOGLOBIN: 13.5 g/dL (ref 13.0–17.0)
HEMOGLOBIN: 12.9 g/dL — AB (ref 13.0–17.0)
MCH: 26.6 pg (ref 26.0–34.0)
MCH: 26.9 pg (ref 26.0–34.0)
MCHC: 34.6 g/dL (ref 30.0–36.0)
MCHC: 34.9 g/dL (ref 30.0–36.0)
MCV: 76.9 fL — ABNORMAL LOW (ref 78.0–100.0)
MCV: 77.1 fL — AB (ref 78.0–100.0)
PLATELETS: 177 10*3/uL (ref 150–400)
Platelets: 180 10*3/uL (ref 150–400)
RBC: 5.07 MIL/uL (ref 4.22–5.81)
RBC: 4.8 MIL/uL (ref 4.22–5.81)
RDW: 13.6 % (ref 11.5–15.5)
RDW: 13.6 % (ref 11.5–15.5)
WBC: 6.2 10*3/uL (ref 4.0–10.5)
WBC: 5.9 10*3/uL (ref 4.0–10.5)

## 2014-07-22 LAB — POCT I-STAT 3, ART BLOOD GAS (G3+)
ACID-BASE DEFICIT: 6 mmol/L — AB (ref 0.0–2.0)
BICARBONATE: 19.9 meq/L — AB (ref 20.0–24.0)
O2 Saturation: 100 %
TCO2: 21 mmol/L (ref 0–100)
pCO2 arterial: 41.3 mmHg (ref 35.0–45.0)
pH, Arterial: 7.291 — ABNORMAL LOW (ref 7.350–7.450)
pO2, Arterial: 262 mmHg — ABNORMAL HIGH (ref 80.0–100.0)

## 2014-07-22 LAB — GLUCOSE, CAPILLARY
GLUCOSE-CAPILLARY: 301 mg/dL — AB (ref 65–99)
GLUCOSE-CAPILLARY: 269 mg/dL — AB (ref 65–99)
GLUCOSE-CAPILLARY: 199 mg/dL — AB (ref 65–99)
GLUCOSE-CAPILLARY: 155 mg/dL — AB (ref 65–99)
GLUCOSE-CAPILLARY: 161 mg/dL — AB (ref 65–99)
GLUCOSE-CAPILLARY: 236 mg/dL — AB (ref 65–99)
Glucose-Capillary: 473 mg/dL — ABNORMAL HIGH (ref 65–99)
Glucose-Capillary: 146 mg/dL — ABNORMAL HIGH (ref 65–99)
Glucose-Capillary: 225 mg/dL — ABNORMAL HIGH (ref 65–99)

## 2014-07-22 LAB — URINE MICROSCOPIC-ADD ON

## 2014-07-22 LAB — BASIC METABOLIC PANEL
ANION GAP: 11 (ref 5–15)
ANION GAP: 8 (ref 5–15)
Anion gap: 9 (ref 5–15)
Anion gap: 8 (ref 5–15)
BUN: 17 mg/dL (ref 6–20)
BUN: 15 mg/dL (ref 6–20)
BUN: 14 mg/dL (ref 6–20)
BUN: 14 mg/dL (ref 6–20)
CALCIUM: 9 mg/dL (ref 8.9–10.3)
CALCIUM: 9.1 mg/dL (ref 8.9–10.3)
CALCIUM: 9 mg/dL (ref 8.9–10.3)
CHLORIDE: 106 mmol/L (ref 101–111)
CO2: 19 mmol/L — AB (ref 22–32)
CO2: 22 mmol/L (ref 22–32)
CO2: 23 mmol/L (ref 22–32)
CO2: 21 mmol/L — ABNORMAL LOW (ref 22–32)
CREATININE: 1.56 mg/dL — AB (ref 0.61–1.24)
CREATININE: 1.53 mg/dL — AB (ref 0.61–1.24)
CREATININE: 1.54 mg/dL — AB (ref 0.61–1.24)
Calcium: 8.9 mg/dL (ref 8.9–10.3)
Chloride: 113 mmol/L — ABNORMAL HIGH (ref 101–111)
Chloride: 119 mmol/L — ABNORMAL HIGH (ref 101–111)
Chloride: 120 mmol/L — ABNORMAL HIGH (ref 101–111)
Creatinine, Ser: 1.48 mg/dL — ABNORMAL HIGH (ref 0.61–1.24)
GFR calc Af Amer: 59 mL/min — ABNORMAL LOW (ref >60)
GFR calc non Af Amer: 51 mL/min — ABNORMAL LOW (ref >60)
GFR calc non Af Amer: 52 mL/min — ABNORMAL LOW (ref >60)
GFR, EST AFRICAN AMERICAN: 60 mL/min — AB (ref >60)
GFR, EST NON AFRICAN AMERICAN: 52 mL/min — AB (ref >60)
GFR, EST NON AFRICAN AMERICAN: 54 mL/min — AB (ref >60)
Glucose, Bld: 773 mg/dL (ref 65–99)
Glucose, Bld: 520 mg/dL — ABNORMAL HIGH (ref 65–99)
Glucose, Bld: 193 mg/dL — ABNORMAL HIGH (ref 65–99)
Glucose, Bld: 214 mg/dL — ABNORMAL HIGH (ref 65–99)
POTASSIUM: 3.8 mmol/L (ref 3.5–5.1)
Potassium: 4.9 mmol/L (ref 3.5–5.1)
Potassium: 3.6 mmol/L (ref 3.5–5.1)
Potassium: 4.2 mmol/L (ref 3.5–5.1)
SODIUM: 150 mmol/L — AB (ref 135–145)
Sodium: 136 mmol/L (ref 135–145)
Sodium: 144 mmol/L (ref 135–145)
Sodium: 149 mmol/L — ABNORMAL HIGH (ref 135–145)

## 2014-07-22 LAB — URINALYSIS, ROUTINE W REFLEX MICROSCOPIC
Bilirubin Urine: NEGATIVE
HGB URINE DIPSTICK: NEGATIVE
KETONES UR: NEGATIVE mg/dL
Leukocytes, UA: NEGATIVE
Nitrite: NEGATIVE
Protein, ur: NEGATIVE mg/dL
Specific Gravity, Urine: 1.022 (ref 1.005–1.030)
Urobilinogen, UA: 0.2 mg/dL (ref 0.0–1.0)
pH: 5.5 (ref 5.0–8.0)

## 2014-07-22 LAB — I-STAT CG4 LACTIC ACID, ED: Lactic Acid, Venous: 2.45 mmol/L (ref 0.5–2.0)

## 2014-07-22 LAB — RAPID URINE DRUG SCREEN, HOSP PERFORMED
AMPHETAMINES: NOT DETECTED
Barbiturates: NOT DETECTED
Benzodiazepines: NOT DETECTED
Cocaine: NOT DETECTED
Opiates: NOT DETECTED
TETRAHYDROCANNABINOL: NOT DETECTED

## 2014-07-22 LAB — I-STAT VENOUS BLOOD GAS, ED
ACID-BASE DEFICIT: 4 mmol/L — AB (ref 0.0–2.0)
Bicarbonate: 23.5 mEq/L (ref 20.0–24.0)
O2 SAT: 44 %
PO2 VEN: 29 mmHg — AB (ref 30.0–45.0)
TCO2: 25 mmol/L (ref 0–100)
pCO2, Ven: 53 mmHg — ABNORMAL HIGH (ref 45.0–50.0)
pH, Ven: 7.254 (ref 7.250–7.300)

## 2014-07-22 LAB — CBG MONITORING, ED

## 2014-07-22 LAB — LACTIC ACID, PLASMA: LACTIC ACID, VENOUS: 3 mmol/L — AB (ref 0.5–2.0)

## 2014-07-22 LAB — MRSA PCR SCREENING: MRSA BY PCR: NEGATIVE

## 2014-07-22 LAB — PROCALCITONIN: Procalcitonin: 0.18 ng/mL

## 2014-07-22 MED ORDER — SODIUM CHLORIDE 0.9 % IV SOLN
INTRAVENOUS | Status: DC
Start: 2014-07-22 — End: 2014-07-22

## 2014-07-22 MED ORDER — LORAZEPAM 2 MG/ML IJ SOLN
INTRAMUSCULAR | Status: AC
  Filled 2014-07-22: qty 1

## 2014-07-22 MED ORDER — INSULIN ASPART 100 UNIT/ML ~~LOC~~ SOLN
0.0000 [IU] | SUBCUTANEOUS | Status: DC
  Administered 2014-07-22: 3 [IU] via SUBCUTANEOUS
  Administered 2014-07-22: 4 [IU] via SUBCUTANEOUS
  Administered 2014-07-22 – 2014-07-23 (×3): 7 [IU] via SUBCUTANEOUS
  Administered 2014-07-23: 11 [IU] via SUBCUTANEOUS
  Administered 2014-07-23 (×2): 7 [IU] via SUBCUTANEOUS
  Administered 2014-07-23: 11 [IU] via SUBCUTANEOUS
  Administered 2014-07-23 – 2014-07-24 (×3): 4 [IU] via SUBCUTANEOUS
  Administered 2014-07-24: 3 [IU] via SUBCUTANEOUS

## 2014-07-22 MED ORDER — PROPOFOL 1000 MG/100ML IV EMUL
INTRAVENOUS | Status: AC
  Administered 2014-07-22: 1000 mg via INTRAVENOUS
  Filled 2014-07-22: qty 100

## 2014-07-22 MED ORDER — LEVETIRACETAM 100 MG/ML PO SOLN
500.0000 mg | Freq: Two times a day (BID) | ORAL | Status: DC
  Administered 2014-07-22 – 2014-07-23 (×3): 500 mg
  Filled 2014-07-22 (×4): qty 5

## 2014-07-22 MED ORDER — ROCURONIUM BROMIDE 50 MG/5ML IV SOLN
1.0000 mg/kg | Freq: Once | INTRAVENOUS | Status: AC
  Administered 2014-07-22: 101 mg via INTRAVENOUS

## 2014-07-22 MED ORDER — GADOBENATE DIMEGLUMINE 529 MG/ML IV SOLN
20.0000 mL | Freq: Once | INTRAVENOUS | Status: AC | PRN
  Administered 2014-07-22: 20 mL via INTRAVENOUS

## 2014-07-22 MED ORDER — SODIUM CHLORIDE 0.9 % IV SOLN: INTRAVENOUS | Status: DC

## 2014-07-22 MED ORDER — ETOMIDATE 2 MG/ML IV SOLN
0.3000 mg/kg | Freq: Once | INTRAVENOUS | Status: AC
Start: 2014-07-22 — End: 2014-07-22
  Administered 2014-07-22: 30.3 mg via INTRAVENOUS

## 2014-07-22 MED ORDER — POTASSIUM CHLORIDE IN NACL 20-0.45 MEQ/L-% IV SOLN
INTRAVENOUS | Status: DC
  Administered 2014-07-22 – 2014-07-24 (×5): via INTRAVENOUS
  Filled 2014-07-22 (×8): qty 1000

## 2014-07-22 MED ORDER — SODIUM CHLORIDE 0.9 % IV SOLN
INTRAVENOUS | Status: DC
  Administered 2014-07-22: 06:00:00 via INTRAVENOUS

## 2014-07-22 MED ORDER — FAMOTIDINE IN NACL 20-0.9 MG/50ML-% IV SOLN
20.0000 mg | INTRAVENOUS | Status: DC
  Administered 2014-07-22 – 2014-07-23 (×2): 20 mg via INTRAVENOUS
  Filled 2014-07-22 (×2): qty 50

## 2014-07-22 MED ORDER — HEPARIN SODIUM (PORCINE) 5000 UNIT/ML IJ SOLN
5000.0000 [IU] | Freq: Three times a day (TID) | INTRAMUSCULAR | Status: DC
  Administered 2014-07-22 – 2014-07-23 (×3): 5000 [IU] via SUBCUTANEOUS
  Filled 2014-07-22 (×6): qty 1

## 2014-07-22 MED ORDER — PANTOPRAZOLE SODIUM 40 MG IV SOLR: 40.0000 mg | INTRAVENOUS | Status: DC

## 2014-07-22 MED ORDER — ETOMIDATE 2 MG/ML IV SOLN
INTRAVENOUS | Status: AC
  Filled 2014-07-22: qty 20

## 2014-07-22 MED ORDER — POTASSIUM CHLORIDE 10 MEQ/100ML IV SOLN
10.0000 meq | INTRAVENOUS | Status: DC
  Administered 2014-07-22 (×2): 10 meq via INTRAVENOUS
  Filled 2014-07-22: qty 100

## 2014-07-22 MED ORDER — LEVETIRACETAM 500 MG PO TABS
500.0000 mg | ORAL_TABLET | Freq: Two times a day (BID) | ORAL | Status: DC
  Filled 2014-07-22 (×2): qty 1

## 2014-07-22 MED ORDER — CETYLPYRIDINIUM CHLORIDE 0.05 % MT LIQD
7.0000 mL | Freq: Four times a day (QID) | OROMUCOSAL | Status: DC
  Administered 2014-07-22 – 2014-07-23 (×4): 7 mL via OROMUCOSAL

## 2014-07-22 MED ORDER — ACETAMINOPHEN 650 MG RE SUPP: 650.0000 mg | Freq: Four times a day (QID) | RECTAL | Status: DC | PRN

## 2014-07-22 MED ORDER — SUCCINYLCHOLINE CHLORIDE 20 MG/ML IJ SOLN
INTRAMUSCULAR | Status: AC
  Filled 2014-07-22: qty 1

## 2014-07-22 MED ORDER — FENTANYL CITRATE (PF) 100 MCG/2ML IJ SOLN
50.0000 ug | INTRAMUSCULAR | Status: DC | PRN
  Administered 2014-07-22 – 2014-07-23 (×3): 50 ug via INTRAVENOUS
  Filled 2014-07-22 (×4): qty 2

## 2014-07-22 MED ORDER — ACETAMINOPHEN 325 MG PO TABS
650.0000 mg | ORAL_TABLET | Freq: Four times a day (QID) | ORAL | Status: DC | PRN
  Administered 2014-07-24 – 2014-07-25 (×2): 650 mg via ORAL
  Filled 2014-07-22 (×2): qty 2

## 2014-07-22 MED ORDER — DEXTROSE-NACL 5-0.45 % IV SOLN: INTRAVENOUS | Status: DC

## 2014-07-22 MED ORDER — LIDOCAINE HCL (CARDIAC) 20 MG/ML IV SOLN
INTRAVENOUS | Status: AC
  Filled 2014-07-22: qty 5

## 2014-07-22 MED ORDER — LORAZEPAM 2 MG/ML IJ SOLN
INTRAMUSCULAR | Status: AC
Start: 2014-07-22 — End: 2014-07-22
  Administered 2014-07-22: 2 mg
  Filled 2014-07-22: qty 1

## 2014-07-22 MED ORDER — ONDANSETRON HCL 4 MG PO TABS: 4.0000 mg | ORAL_TABLET | Freq: Four times a day (QID) | ORAL | Status: DC | PRN

## 2014-07-22 MED ORDER — OXYCODONE HCL 5 MG PO TABS: 5.0000 mg | ORAL_TABLET | ORAL | Status: DC | PRN

## 2014-07-22 MED ORDER — ONDANSETRON HCL 4 MG/2ML IJ SOLN
4.0000 mg | Freq: Four times a day (QID) | INTRAMUSCULAR | Status: DC | PRN
  Administered 2014-07-25: 4 mg via INTRAVENOUS
  Filled 2014-07-22: qty 2

## 2014-07-22 MED ORDER — CHLORHEXIDINE GLUCONATE 0.12 % MT SOLN
15.0000 mL | Freq: Two times a day (BID) | OROMUCOSAL | Status: DC
  Administered 2014-07-22 – 2014-07-23 (×3): 15 mL via OROMUCOSAL
  Filled 2014-07-22 (×3): qty 15

## 2014-07-22 MED ORDER — PROPOFOL 1000 MG/100ML IV EMUL
5.0000 ug/kg/min | INTRAVENOUS | Status: DC
  Administered 2014-07-22 (×3): 35 ug/kg/min via INTRAVENOUS
  Administered 2014-07-22: 1000 mg via INTRAVENOUS
  Administered 2014-07-22: 70 ug/kg/min via INTRAVENOUS
  Administered 2014-07-23 (×2): 50 ug/kg/min via INTRAVENOUS
  Filled 2014-07-22 (×7): qty 100

## 2014-07-22 MED ORDER — ROCURONIUM BROMIDE 50 MG/5ML IV SOLN
INTRAVENOUS | Status: AC
  Filled 2014-07-22: qty 2

## 2014-07-22 MED ORDER — SODIUM CHLORIDE 0.9 % IV SOLN: INTRAVENOUS | Status: AC

## 2014-07-22 MED ORDER — HYDROMORPHONE HCL 1 MG/ML IJ SOLN: 0.5000 mg | INTRAMUSCULAR | Status: DC | PRN

## 2014-07-22 MED ORDER — INSULIN GLARGINE 100 UNIT/ML ~~LOC~~ SOLN
40.0000 [IU] | Freq: Every day | SUBCUTANEOUS | Status: DC
  Administered 2014-07-22 – 2014-07-24 (×3): 40 [IU] via SUBCUTANEOUS
  Filled 2014-07-22 (×4): qty 0.4

## 2014-07-22 NOTE — Procedures (Signed)
ELECTROENCEPHALOGRAM REPORT  Patient: [name]       Room #: 0U54 EEG No. ID: [id number] Age: 49 y.o.        Sex: male Referring Physician: Titus Mould, D Report Date:  07/22/2014        Interpreting Physician: Anthony Sar  History: [name] is an 49 y.o. male with a history of diabetes mellitus and hypertension admitted with marked hyperglycemia and generalized seizure activity. He has no previous history of seizure disorder. MRI showed a midline ossified frontal mass lesion, as well as a smaller more anterior midline frontal lesion, and a small enhancing left insular mass. Patient has a history of rectal cancer.  Indications for study:  Rule out ongoing seizure activity.  Technique: This is an 18 channel routine scalp EEG performed at the bedside with bipolar and monopolar montages arranged in accordance to the international 10/20 system of electrode placement.   Description: Patient was intubated and on mechanical ventilation as well as sedated with propofol at the time of this study. No clinical seizure activity was noted. Predominant activity consisted of low amplitude diffuse mixed and theta activity with superimposed diffuse faster low amplitude 9-11 Hz rhythmic activity. Photic stimulation was not performed. No epileptiform discharges were recorded.  Interpretation: This EEG showed mild generalized nonspecific continuous slowing of cerebral activity which may be related to mild diffuse encephalopathy. Sedating medication may also be contributing to slowing of cerebral activity. No epileptiform activity was recorded.   Rush Farmer M.D. [facility] [phone]

## 2014-07-22 NOTE — Consult Note (Signed)
Neurology Consultation Reason for Consult: Seizures Referring Physician: Aline Brochure, F  CC: Seizures  History is obtained from:Patient's wife, he is too agitated to give history  HPI: [name] is a 49 y.o. male with diabetes who presents with seizure. He has had several weeks of dry mouth, polyuria and is found to have a sugar of almost 1100 in the ED. He had two seizures, one at hoem, and one en route.   In the ED, he is still confused, but improving and CT shows a mass as well as bifrontal encephalomalacia. Radiology notes these can be seen post radiation, but wife doe snot remember radiation to his brain for his prostate cancer.   He had a low grade fever on arrival, but has not complained of headache, and had no fevers at home.    LKW: unclear  tpa given?: no, out of window.     ROS:  Unable to obtain due to altered mental status.   Past Medical History  Diagnosis Date  . Diabetes mellitus without complication   . Hypertension     Family History: No hx sz per wife  Social History: Tob: denies  Exam: Current vital signs: BP 133/76 mmHg  Pulse 90  Temp(Src) 99.1 F (37.3 C) (Rectal)  Resp 22  SpO2 100% Vital signs in last 24 hours: Temp:  [99.1 F (37.3 C)-100.4 F (38 C)] 99.1 F (37.3 C) (07/09 0045) Pulse Rate:  [90-127] 90 (07/09 0045) Resp:  [22-38] 22 (07/09 0045) BP: (103-166)/(58-76) 133/76 mmHg (07/08 2300) SpO2:  [94 %-100 %] 100 % (07/09 0045)  Physical Exam  Constitutional: Appears well-developed and well-nourished.  Psych: Affect appropriate to situation Eyes: No scleral injection HENT: No OP obstrucion Head: Normocephalic.  Cardiovascular: Normal rate and regular rhythm.  Respiratory: Effort normal and breath sounds normal to anterior ascultation GI: Soft.  No distension. There is no tenderness.  Skin: WDI  Neuro: Mental Status: Patient is somnolent but rousable. He is agitated and insisting ofn getting out of bed.  No signs of  aphasia or neglect Cranial Nerves: II: blinks to threat bilaterally. Pupils are equal, round, and reactive to light.   III,IV, VI: eyes are dysconjugate until nox stim is applied at which time the patient conjugates and fixates this examiner, tracking across midline in both directions. V: Facial sensation is symmetric to temperature VII: Facial movement appear symmetric.  VIII: hearing is intact to voice X, XI, XII: Unable to assess secondary to patient's altered mental status.  Motor: Tone is normal. Bulk is normal. 5/5 strength was present in all four extremities.  Sensory: Responds to stim x 4 Deep Tendon Reflexes: Will not cooperate Cerebellar: Unable to assess secondary to patient's altered mental status.     I have reviewed labs in epic and the results pertinent to this consultation are: Mildly low sodium(likely pseudo)  Glucose 1094  I have reviewed the images obtained:CT head - bifrontal encephalomalacia, heavily calcified midline mass.   Impression: 49 yo M with new onset seizures. His glucose alone could be enough to cause seizures, but with the CT findings, I think it would be reasonable to continue keppr aat least in the short term. He will need further imaging once he is able to hold still.   I would favor slightly cautious correction of his glucose to avoid cerebral edema.   Recommendations: 1) agree with correction of glucose 2) MRI brain 3) EEG  4) Keppra 500mg  BID.     Roland Rack, MD [facility] [phone]  If 7pm- 7am, please page neurology on call as listed in [place].

## 2014-07-22 NOTE — Progress Notes (Signed)
EEG completed, results pending. 
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
## 2014-07-22 NOTE — ED Notes (Signed)
Ancil Linsey, MD at bedside to intubate.

## 2014-07-22 NOTE — Procedures (Signed)
Endotracheal Intubation Procedure Note  Indication for endotracheal intubation: impending respiratory failure and respiratory failure. Airway Assessment: Mallampati Class: II (hard and soft palate, upper portion of tonsils anduvula visible). Sedation: etomidate. Paralytic: rocuronium. Lidocaine: no. Atropine: no. Equipment: Miller 4 laryngoscope blade and 7.52mm cuffed endotracheal tube. Cricoid Pressure: no. Number of attempts: 1. ETT location confirmed by by auscultation and ETCO2 monitor.  [name], Marjory Lies 07/22/2014

## 2014-07-22 NOTE — H&P (Addendum)
[facility] Admission History and Physical       ADEN SEK VFI:[id number] DOB: Jul 01, 1965 DOA: 07/21/2014  Referring physician: EDP PCP: No PCP Per Patient  Specialists:   Chief Complaint:   HPI: [name] is a 49 y.o. male with a history of HTN and a remote hx of Rectal Ca S/P Resection who was witnessed at home by his wife as having seizure like activity lasting 5 minutes with decreased responsiveness, and a loss of urine.   EMS was called and he was brought to the ED, and had 2 more episodes of tonic-clonic activity.    He was medicated with IV Ativan and loaded with IV Keppra, and Sent for a CT scan of the Head.   The Ct revealed a Fronto temporal mass 4.1 x 2.2 cm with vasogenic edema.   Neurology Dr Leonel Ramsay was consulted and saw him in the ED.   The patient is unable to give a history at this time, and his wife is at the bedside and reports that he has had polyuria, poly dipsia x 2-3 weeks.  He has not had any previous history of Seizures or Diabetes.      Review of Systems: Unable to Obtain from the Patient   Past Medical History  Diagnosis Date  . Diabetes mellitus without complication   . Hypertension      Past Surgical History  Procedure Laterality Date  . Joint replacement Left 2004    Left hip      Prior to Admission medications   Medication Sig Start Date End Date Taking? Authorizing Provider  amLODipine (NORVASC) 10 MG tablet Take 1 tablet (10 mg total) by mouth daily. Patient not taking: Reported on 07/21/2014 02/01/14   Delice Bison Ward, DO  hydrochlorothiazide (HYDRODIURIL) 25 MG tablet Take 1 tablet (25 mg total) by mouth daily. Patient not taking: Reported on 07/21/2014 02/01/14   Delice Bison Ward, DO     No Known Allergies  Social History:  reports that he has never smoked. He does not have any smokeless tobacco history on file. He reports that he drinks about 1.8 oz of alcohol per week. He reports that he does not use illicit drugs.     Family History  Problem Relation Age of Onset  . Hypertension Mother   . Heart disease Mother   . Hypertension Sister   . Hypertension Brother        Physical Exam:  GEN:  Pleasant Obtunded Obese  49 y.o.  African American  male examined and in no acute distress; Filed Vitals:   07/21/14 2230 07/21/14 2245 07/21/14 2300 07/22/14 0045  BP: 110/59 122/71 133/76   Pulse: 109 104 103 90  Temp:    99.1 F (37.3 C)  TempSrc:      Resp: 35 31 33 22  SpO2: 98% 99% 98% 100%   Blood pressure 133/76, pulse 90, temperature 99.1 F (37.3 C), temperature source Rectal, resp. rate 22, SpO2 100 %. PSYCH: He is alert and oriented x 0; does not appear anxious does not appear depressed; affect is normal HEENT: Normocephalic and Atraumatic, Mucous membranes pink; PERRLA; EOM intact; Fundi:  Benign;  No scleral icterus, Nares: Patent, Oropharynx: Clear, Fair Dentition,    Neck:  FROM, No Cervical Lymphadenopathy nor Thyromegaly or Carotid Bruit; No JVD; Breasts:: Not examined CHEST WALL: No tenderness CHEST: Normal respiration, clear to auscultation bilaterally HEART: Regular rate and rhythm; no murmurs rubs or gallops BACK: No kyphosis or scoliosis; No  CVA tenderness ABDOMEN: Positive Bowel Sounds, Obese, Soft Non-Tender, No Rebound or Guarding; No Masses, No Organomegaly, No Pannus; No Intertriginous candida. Rectal Exam: Not done EXTREMITIES: No Cyanosis, Clubbing, or Edema; No Ulcerations. Genitalia: not examined PULSES: 2+ and symmetric SKIN: Normal hydration no rash or ulceration CNS:  Alert and Oriented x 0 Sedated,  Moving all 4 Extremities Vascular: pulses palpable throughout    Labs on Admission:  Basic Metabolic Panel:  Recent Labs Lab 07/21/14 2130  NA 129*  K 5.7*  CL 94*  CO2 16*  GLUCOSE 1094*  BUN 19  CREATININE 2.00*  CALCIUM 9.2   Liver Function Tests:  Recent Labs Lab 07/21/14 2130  AST 32  ALT 44  ALKPHOS 151*  BILITOT 0.7  PROT 7.4  ALBUMIN 4.1    No results for input(s): LIPASE, AMYLASE in the last 168 hours. No results for input(s): AMMONIA in the last 168 hours. CBC:  Recent Labs Lab 07/21/14 2130  WBC 4.4  NEUTROABS 2.9  HGB 14.0  HCT 40.2  MCV 78.5  PLT 193   Cardiac Enzymes: No results for input(s): CKTOTAL, CKMB, CKMBINDEX, TROPONINI in the last 168 hours.  BNP (last 3 results) No results for input(s): BNP in the last 8760 hours.  ProBNP (last 3 results) No results for input(s): PROBNP in the last 8760 hours.  CBG:  Recent Labs Lab 07/21/14 2125  GLUCAP >600*    Radiological Exams on Admission: Ct Head Wo Contrast  07/21/2014   CLINICAL DATA:  Status post seizure. Patient unresponsive. Hyperglycemia and high blood pressure. Tachycardia. Initial encounter.  EXAM: CT HEAD WITHOUT CONTRAST  TECHNIQUE: Contiguous axial images were obtained from the base of the skull through the vertex without intravenous contrast.  COMPARISON:  None.  FINDINGS: There is no evidence of acute infarction, or intra- or extra-axial hemorrhage on CT.  There appears to be a diffusely calcified 4.1 x 2.2 cm mass at the high right frontoparietal region, with mild extension across the anterior commissure. Overlying vasogenic edema is noted, within the right frontoparietal region. No significant midline shift is seen.  Bilateral symmetric encephalomalacia is noted at the frontal lobes. This appearance, and the underlying thinning of the calvarium, is highly suggestive of chronic postradiation change. Per clinical correlation, the patient and family do not have any recollection of prior radiation therapy to the head.  The posterior fossa, including the cerebellum, brainstem and fourth ventricle, is within normal limits. The third and lateral ventricles, and basal ganglia are unremarkable in appearance.  There is no evidence of acute fracture. There is diffuse thinning of the frontal calvarium bilaterally, as mentioned above. The orbits are within  normal limits. The paranasal sinuses and mastoid air cells are well-aerated. No significant soft tissue abnormalities are seen.  IMPRESSION: 1. Diffusely calcified 4.1 x 2.2 cm mass at the high right frontoparietal region, with mild extension across the anterior commissure. Overlying vasogenic edema noted. No significant midline shift seen. This likely explains the patient's seizures; MRI with contrast would be helpful for further evaluation on an elective nonemergent basis, to exclude underlying enhancing lesions. 2. Bilateral symmetric encephalomalacia at the frontal lobes. This appearance, and the underlying thinning of the calvarium, is highly suggestive of chronic postradiation change, which would also explain the degree of calcification of the mass. Per clinical correlation, however, the patient and family do not have any recollection of prior radiation therapy to the head. Would correlate further with the patient's history. These results were called by telephone at  the time of interpretation on 07/21/2014 at 10:37 pm to Dr. Pamella Pert, who verbally acknowledged these results.   Electronically Signed   By: Garald Balding M.D.   On: 07/21/2014 22:42   Dg Chest Port 1 View  07/21/2014   CLINICAL DATA:  New onset seizure.  EXAM: PORTABLE CHEST - 1 VIEW  COMPARISON:  None.  FINDINGS: The cardiomediastinal silhouette is unremarkable.  Mild bibasilar opacities are noted -favor atelectasis over airspace disease.  There is no evidence of pulmonary edema, suspicious pulmonary nodule/mass, pleural effusion, or pneumothorax. No acute bony abnormalities are identified.  IMPRESSION: Mild bibasilar opacities -favor atelectasis over airspace disease.   Electronically Signed   By: Margarette Canada M.D.   On: 07/21/2014 21:18     EKG: Independently reviewed.    Assessment/Plan:   49 y.o. male with  Principal Problem:   1.   Seizures- Due to Hyperglycemia, vs due to Brain Mass   Seizure Precautions   IV  Keppra    PRN IV Ativan   Neurology following       Active Problems:   2.   DKA (diabetic ketoacidoses)/Hyperglycemia- New Diabetes   DKA Protocol   IVFs    Monitor Electrolytes   Transition to  Lantus Insulin and SSI    Will Need Diabetes Education   Check ABG       3.   Brain Mass-   MRI of Brain done Results Pending    Seen By Neurology       4.   Fever/Sepsis-    Sepsis Workup   IV Vanc and Zosyn Given      5.   Malignant neoplasm of rectum-Hx    Past History     6.   Hypertension- Not on Meds currently   Monitor BPs     7.   DVT Prophylaxis   SCDs        Code Status:     FULL CODE Family Communication:   Wife at Bedside   Disposition Plan:    Inpatient  Observation Status        Time spent:  [place] [facility] Pager [phone]   If [place] Please Contact the Day Rounding Team MD for [facility]  If 7PM-7AM, Please Contact Night-Floor Coverage  [email or web address] Password [email or web address] 07/22/2014, 1:45 AM     ADDENDUM:   Patient was seen and examined on 07/22/2014      ADDENDUM: PCCM consulted due to Patients Agitation and Restlessness despite a total of 6 mg IV Ativan Given, Patient is hemodynamically stable, and  Patient is to be intubated and transferred to ICU and PCCM service.

## 2014-07-22 NOTE — ED Notes (Signed)
CBG reading "high". Nurse notified.

## 2014-07-22 NOTE — Progress Notes (Signed)
eLink Physician-Brief Progress Note Patient Name: [name] DOB: March 01, 1965 MRN: [id number]   Date of Service  07/22/2014  HPI/Events of Note  Best Practice  eICU Interventions  PPI for stress ulcer propy while intubated     Intervention Category Intermediate Interventions: Best-practice therapies (e.g. DVT, beta blocker, etc.)  [name] 07/22/2014, 5:15 AM

## 2014-07-22 NOTE — Progress Notes (Signed)
Initial Nutrition Assessment  DOCUMENTATION CODES: Obesity unspecified  INTERVENTION: If unable to extubate and medically able, recommend TF. However, due to current high rate of sedative,  recommend only prostat via OGT to best meet patient needs.  Administer Prostat 30 ml BID on day 1; on day 2, increase to 60 ml Prostat QID to provide 800 kcals (receiving 576 kcals from propofol) and 120 gm protein  Will monitor closely and change TF/prostat recommendations as needed  NUTRITION DIAGNOSIS: Inadequate oral intake related to inability to eat as evidenced by NPO status.  GOAL: Provide needs based on ASPEN/SCCM guidelines  MONITOR: Vent status, Weight trends, Labs, I & O's, Decision to initiate TF or not, propofol rate  REASON FOR ASSESSMENT: Ventilator    ASSESSMENT: 49 y/o man with hx of HTN, DM and rectal CA s/p resection who developed seizure at home. Found to have calcified intracranial mass  Per MD notes: he has had polyuria/polydipsia x 2-3 weeks  Patient is currently intubated on ventilator support MV: 8.4 L/min Temp (24hrs), Avg:98.6 F (37 C), Min:98.1 F (36.7 C), Max:100.4 F (38 C)  Propofol: 21.8 ml/hr   575.5 kcals  Pt intubated. No family/friends present to obtain wt/oral intake hx from.   Height: Ht Readings from Last 1 Encounters:  07/22/14 5\' 6"  (1.676 m)    Weight: Wt Readings from Last 1 Encounters:  07/22/14 200 lb 6.4 oz (90.9 kg)   Ideal Body Weight:  64.54 kg  Wt Readings from Last 10 Encounters:  07/22/14 200 lb 6.4 oz (90.9 kg)  02/01/14 224 lb 6.4 oz (101.787 kg)  Unsure of admit weight.   BMI:  Body mass index is 32.36 kg/(m^2).  Estimated Nutritional Needs: Kcal:  1000-1272 kcals (11-14 kcal/kg)  Protein:  129 g Pro (2 g/kg IBW) Fluid:  Per MD  Skin:  Reviewed, no issues  Diet Order:   NPO  EDUCATION NEEDS: No education needs identified at this time   Intake/Output Summary (Last 24 hours) at 07/22/14 1140 Last data  filed at 07/22/14 1100  Gross per 24 hour  Intake 5784.27 ml  Output   2998 ml  Net 2786.27 ml    Last BM: Unknown  Burtis Junes RD, LDN Nutrition Pager: [phone] 07/22/2014 11:40 AM

## 2014-07-22 NOTE — Progress Notes (Signed)
[place] ICU Electrolyte Replacement Protocol  Patient Name: JHOEL STIEG DOB: March 19, 1965 MRN: [id number]  Date of Service  07/22/2014   HPI/Events of Note    Recent Labs Lab 07/21/14 2130 07/22/14 0322 07/22/14 0545  NA 129* 136 144  K 5.7* 4.9 3.6  CL 94* 106 113*  CO2 16* 19* 22  GLUCOSE 1094* 773* 520*  BUN _0 CREATININE 2.00* 1.56* 1.53*  CALCIUM 9.2 9.0 8.9    Estimated Creatinine Clearance: 62.3 mL/min (by C-G formula based on Cr of 1.53).  Intake/Output      07/08 0701 - 07/09 0700   I.V. (mL/kg) 279.5 (3.1)   Other 0   NG/GT 0   IV Piggyback 4660   Total Intake(mL/kg) 4939.5 (54.3)   Urine (mL/kg/hr) 2650   Emesis/NG output 0   Total Output 2650   Net +2289.5        - I/O DETAILED x24h    Total I/O In: 4939.5 [I.V.:279.5; IV Piggyback:4660] Out: 2650 [Urine:2650] - I/O THIS SHIFT    ASSESSMENT   eICURN Interventions  Electrolyte protocol criteria met.  Replace per protocol.  MD notified   ASSESSMENT: MAJOR ELECTROLYTE    Lorene Dy 07/22/2014, 6:51 AM

## 2014-07-22 NOTE — H&P (Signed)
PULMONARY / CRITICAL CARE MEDICINE HISTORY AND PHYSICAL EXAMINATION   Name: [name] MRN: [id number] DOB: 12/16/1965    ADMISSION DATE:  07/21/2014  PRIMARY SERVICE: PCCM  CHIEF COMPLAINT:  Headache, seizure  BRIEF PATIENT DESCRIPTION: 49 y/o man with hx of rectal CA s/p resection who developed seizure at home. Found to have calcified intracranial mass  SIGNIFICANT EVENTS / STUDIES:  Head CT 7/9 >> Diffusely calcified 4.1 x 2.2 cm mass at the high right frontoparietal region   LINES / TUBES: 7.5 mm ETT >> 07/22/14  CULTURES: Urine 07/22/14 Blood x2 07/21/14  ANTIBIOTICS: vanc pip-tazo  HISTORY OF PRESENT ILLNESS:  (obtained from Dr. Kevan Ny when patient was not obtunded): [name] is a 49 y.o. male with a history of HTN as well as colorectal CA with resection and radiation. He was brought to ED by his wife after witnessed seizure like activity lasting 5 minutes with decreased responsiveness, and a loss of urine.He had 2 more episodes of tonic-clonic activity observed bny EMS.He was medicated with IV Ativan and loaded with IV Keppra, and a CT scan of the head was obtained which showed a calcified frontotemporal mass with surrounding vasogenic edema. Neurology was consulted, and the patient admitted to medicine. He became progressively more agitated requriing doses of BZD, and he became more obtunded. Ultimately PCCM was called for admission. Dr Leonel Ramsay from neurology  was consulted and saw him in the ED.The patient's wife reported that he has had polyuria/polydipsia x 2-3 weeks. He has not had any previous history of Seizures or Diabetes.    PAST MEDICAL HISTORY :  Past Medical History  Diagnosis Date  . Diabetes mellitus without complication   . Hypertension    Past Surgical History  Procedure Laterality Date  . Joint replacement Left 2004    Left hip   Prior to Admission medications   Medication Sig Start Date End Date Taking? Authorizing Provider   amLODipine (NORVASC) 10 MG tablet Take 1 tablet (10 mg total) by mouth daily. Patient not taking: Reported on 07/21/2014 02/01/14   Delice Bison Ward, DO  hydrochlorothiazide (HYDRODIURIL) 25 MG tablet Take 1 tablet (25 mg total) by mouth daily. Patient not taking: Reported on 07/21/2014 02/01/14   Delice Bison Ward, DO   No Known Allergies  FAMILY HISTORY:  Family History  Problem Relation Age of Onset  . Hypertension Mother   . Heart disease Mother   . Hypertension Sister   . Hypertension Brother    SOCIAL HISTORY:  reports that he has never smoked. He does not have any smokeless tobacco history on file. He reports that he drinks about 1.8 oz of alcohol per week. He reports that he does not use illicit drugs.  REVIEW OF SYSTEMS:  Cannot assess  SUBJECTIVE:   VITAL SIGNS: Temp:  [99.1 F (37.3 C)-100.4 F (38 C)] 99.1 F (37.3 C) (07/09 0045) Pulse Rate:  [87-127] 87 (07/09 0305) Resp:  [22-38] 29 (07/09 0305) BP: (103-166)/(58-110) 144/110 mmHg (07/09 0305) SpO2:  [94 %-100 %] 99 % (07/09 0305) Weight:  [222 lb 10.6 oz (101 kg)] 222 lb 10.6 oz (101 kg) (07/09 0300) HEMODYNAMICS:   VENTILATOR SETTINGS:   INTAKE / OUTPUT: Intake/Output      07/08 0701 - 07/09 0700   Urine (mL/kg/hr) 1700   Total Output 1700   Net -1700         PHYSICAL EXAMINATION: General:  Well appearing AAM who is obtunded. Neuro:  Moves all extremities and will  respond appropriately with deep, noxious stimulus. HEENT:  MMM Neck: No JVD Cardiovascular:  RRR Lungs:  CTAB Abdomen:  Non-distended Musculoskeletal:  Normal Skin:  No rashes  LABS:  CBC  Recent Labs Lab 07/21/14 2130  WBC 4.4  HGB 14.0  HCT 40.2  PLT 193   Coag's No results for input(s): APTT, INR in the last 168 hours. BMET  Recent Labs Lab 07/21/14 2130  NA 129*  K 5.7*  CL 94*  CO2 16*  BUN 19  CREATININE 2.00*  GLUCOSE 1094*   Electrolytes  Recent Labs Lab 07/21/14 2130  CALCIUM 9.2   Sepsis  Markers  Recent Labs Lab 07/22/14 0222  LATICACIDVEN 2.45*   ABG No results for input(s): PHART, PCO2ART, PO2ART in the last 168 hours. Liver Enzymes  Recent Labs Lab 07/21/14 2130  AST 32  ALT 44  ALKPHOS 151*  BILITOT 0.7  ALBUMIN 4.1   Cardiac Enzymes No results for input(s): TROPONINI, PROBNP in the last 168 hours. Glucose  Recent Labs Lab 07/21/14 2125  GLUCAP >600*    Imaging Ct Head Wo Contrast  07/21/2014   CLINICAL DATA:  Status post seizure. Patient unresponsive. Hyperglycemia and high blood pressure. Tachycardia. Initial encounter.  EXAM: CT HEAD WITHOUT CONTRAST  TECHNIQUE: Contiguous axial images were obtained from the base of the skull through the vertex without intravenous contrast.  COMPARISON:  None.  FINDINGS: There is no evidence of acute infarction, or intra- or extra-axial hemorrhage on CT.  There appears to be a diffusely calcified 4.1 x 2.2 cm mass at the high right frontoparietal region, with mild extension across the anterior commissure. Overlying vasogenic edema is noted, within the right frontoparietal region. No significant midline shift is seen.  Bilateral symmetric encephalomalacia is noted at the frontal lobes. This appearance, and the underlying thinning of the calvarium, is highly suggestive of chronic postradiation change. Per clinical correlation, the patient and family do not have any recollection of prior radiation therapy to the head.  The posterior fossa, including the cerebellum, brainstem and fourth ventricle, is within normal limits. The third and lateral ventricles, and basal ganglia are unremarkable in appearance.  There is no evidence of acute fracture. There is diffuse thinning of the frontal calvarium bilaterally, as mentioned above. The orbits are within normal limits. The paranasal sinuses and mastoid air cells are well-aerated. No significant soft tissue abnormalities are seen.  IMPRESSION: 1. Diffusely calcified 4.1 x 2.2 cm mass at  the high right frontoparietal region, with mild extension across the anterior commissure. Overlying vasogenic edema noted. No significant midline shift seen. This likely explains the patient's seizures; MRI with contrast would be helpful for further evaluation on an elective nonemergent basis, to exclude underlying enhancing lesions. 2. Bilateral symmetric encephalomalacia at the frontal lobes. This appearance, and the underlying thinning of the calvarium, is highly suggestive of chronic postradiation change, which would also explain the degree of calcification of the mass. Per clinical correlation, however, the patient and family do not have any recollection of prior radiation therapy to the head. Would correlate further with the patient's history. These results were called by telephone at the time of interpretation on 07/21/2014 at 10:37 pm to Dr. Pamella Pert, who verbally acknowledged these results.   Electronically Signed   By: Garald Balding M.D.   On: 07/21/2014 22:42   Dg Chest Port 1 View  07/21/2014   CLINICAL DATA:  New onset seizure.  EXAM: PORTABLE CHEST - 1 VIEW  COMPARISON:  None.  FINDINGS: The cardiomediastinal silhouette is unremarkable.  Mild bibasilar opacities are noted -favor atelectasis over airspace disease.  There is no evidence of pulmonary edema, suspicious pulmonary nodule/mass, pleural effusion, or pneumothorax. No acute bony abnormalities are identified.  IMPRESSION: Mild bibasilar opacities -favor atelectasis over airspace disease.   Electronically Signed   By: Margarette Canada M.D.   On: 07/21/2014 21:18    EKG: NSR CXR: Pending  ASSESSMENT / PLAN:  Principal Problem:   Seizures Active Problems:   Malignant neoplasm of rectum   DKA (diabetic ketoacidoses)   Hypertension   Hyperglycemia   Hyperosmolar (nonketotic) coma   Brain mass   PULMONARY A: Hypercarbic respiratory failure, s/p intubation P:   Maintain on PRVC, VAP prevention bundle. Daily  SBTs  CARDIOVASCULAR A: No active issues P:    RENAL A: AKI due to hypovoelmia from HHS P:   Aggressively hydrate  GASTROINTESTINAL A: No active issues P:   none  HEMATOLOGIC/Onc A: Possible malignancy in head, may be related to prior malignancy, but unclear. Will need further investigation P:   Will f/u with neuro recs  INFECTIOUS A: Possible infectious trigger P:   Will cx, empiric tx for broad coverage.  ENDOCRINE A: HHS, DM P:   Aggressive hydration, protocol for HHS in place.  NEUROLOGIC A: S/p seizure Intracranial mass. P:   Presently with secure airway, had received a fairly hefty dose of sedation. Will f/u MRI, neuro recs.  BEST PRACTICE / DISPOSITION Level of Care:  ICU Primary Service:  PCCM Consultants:  Neuro Code Status:  Full Diet:  NPO DVT Px:  Hep GI Px:  None Skin Integrity:  Intact Social / Family:  Could not contact tonight  TODAY'S SUMMARY: 49 y/o man with HHS and respiratory failure with seizure and brain mass.  I have personally obtained a history, examined the patient, evaluated laboratory and imaging results, formulated the assessment and plan and placed orders.  CRITICAL CARE: The patient is critically ill with multiple organ systems failure and requires high complexity decision making for assessment and support, frequent evaluation and titration of therapies, application of advanced monitoring technologies and extensive interpretation of multiple databases. Critical Care Time devoted to patient care services described in this note is 77 minutes.   Luz Brazen, MD Pulmonary and [place] Pager: [phone]   07/22/2014, 3:32 AM

## 2014-07-22 NOTE — Plan of Care (Signed)
Problem: Consults Goal: Skin Care Protocol Initiated - if Braden Score 18 or less If consults are not indicated, leave blank or document N/A Outcome: Progressing Skin intact - placed sacral pad.

## 2014-07-23 ENCOUNTER — Inpatient Hospital Stay (HOSPITAL_COMMUNITY): Payer: BLUE CROSS/BLUE SHIELD

## 2014-07-23 DIAGNOSIS — E1101 Type 2 diabetes mellitus with hyperosmolarity with coma: Secondary | ICD-10-CM

## 2014-07-23 DIAGNOSIS — J9602 Acute respiratory failure with hypercapnia: Secondary | ICD-10-CM

## 2014-07-23 LAB — CBC
HEMATOCRIT: 36.1 % — AB (ref 39.0–52.0)
HEMOGLOBIN: 12.4 g/dL — AB (ref 13.0–17.0)
MCH: 27.3 pg (ref 26.0–34.0)
MCHC: 34.3 g/dL (ref 30.0–36.0)
MCV: 79.3 fL (ref 78.0–100.0)
Platelets: 185 10*3/uL (ref 150–400)
RBC: 4.55 MIL/uL (ref 4.22–5.81)
RDW: 14.3 % (ref 11.5–15.5)
WBC: 5.8 10*3/uL (ref 4.0–10.5)

## 2014-07-23 LAB — PROCALCITONIN: PROCALCITONIN: 0.13 ng/mL

## 2014-07-23 LAB — BASIC METABOLIC PANEL
ANION GAP: 10 (ref 5–15)
BUN: 10 mg/dL (ref 6–20)
CO2: 19 mmol/L — AB (ref 22–32)
Calcium: 8.8 mg/dL — ABNORMAL LOW (ref 8.9–10.3)
Chloride: 116 mmol/L — ABNORMAL HIGH (ref 101–111)
Creatinine, Ser: 1.58 mg/dL — ABNORMAL HIGH (ref 0.61–1.24)
GFR calc Af Amer: 58 mL/min — ABNORMAL LOW (ref >60)
GFR calc non Af Amer: 50 mL/min — ABNORMAL LOW (ref >60)
Glucose, Bld: 246 mg/dL — ABNORMAL HIGH (ref 65–99)
Potassium: 3.8 mmol/L (ref 3.5–5.1)
SODIUM: 145 mmol/L (ref 135–145)

## 2014-07-23 LAB — GLUCOSE, CAPILLARY
GLUCOSE-CAPILLARY: 229 mg/dL — AB (ref 65–99)
GLUCOSE-CAPILLARY: 245 mg/dL — AB (ref 65–99)
Glucose-Capillary: 183 mg/dL — ABNORMAL HIGH (ref 65–99)
Glucose-Capillary: 293 mg/dL — ABNORMAL HIGH (ref 65–99)

## 2014-07-23 LAB — URINE CULTURE: Culture: 5000

## 2014-07-23 MED ORDER — LORAZEPAM 2 MG/ML IJ SOLN
2.0000 mg | INTRAMUSCULAR | Status: DC | PRN
  Filled 2014-07-23: qty 1

## 2014-07-23 MED ORDER — SODIUM CHLORIDE 0.9 % IV SOLN
500.0000 mg | Freq: Two times a day (BID) | INTRAVENOUS | Status: DC
  Administered 2014-07-23 – 2014-07-25 (×5): 500 mg via INTRAVENOUS
  Filled 2014-07-23 (×8): qty 5

## 2014-07-23 MED ORDER — DEXMEDETOMIDINE HCL IN NACL 400 MCG/100ML IV SOLN
0.0000 ug/kg/h | INTRAVENOUS | Status: DC
  Administered 2014-07-23: 1 ug/kg/h via INTRAVENOUS
  Administered 2014-07-23: 0.4 ug/kg/h via INTRAVENOUS
  Administered 2014-07-23 – 2014-07-24 (×3): 0.6 ug/kg/h via INTRAVENOUS
  Filled 2014-07-23 (×4): qty 100
  Filled 2014-07-23: qty 50

## 2014-07-23 MED ORDER — CETYLPYRIDINIUM CHLORIDE 0.05 % MT LIQD
7.0000 mL | Freq: Two times a day (BID) | OROMUCOSAL | Status: DC
  Administered 2014-07-23 – 2014-07-25 (×6): 7 mL via OROMUCOSAL

## 2014-07-23 NOTE — Progress Notes (Signed)
Subjective: No recurrence of seizure activity reported. No adverse clinical events eyes reported overnight. He remains intubated and sedated with propofol.  Objective: Current vital signs: BP 111/73 mmHg  Pulse 73  Temp(Src) 99.5 F (37.5 C) (Core (Comment))  Resp 16  Ht 5\' 6"  (1.676 m)  Wt 91.5 kg (201 lb 11.5 oz)  BMI 32.57 kg/m2  SpO2 100%  Neurologic Exam: She currently is on propofol 50 mcg/kg/m. He is minimally responsive to noxious stimuli. Pupils were equal and reacted normally to light. Extraocular movements were absent with oculocephalic maneuvers. No facial asymmetry noted. Patient withdraws extremities equally to noxious stimuli. Muscle tone was flaccid at rest. He had no abnormal posturing. Deep tendon reflexes were absent bilaterally. Plantar responses were mute bilaterally.   Medications: I have reviewed the patient's current medications.  Assessment/Plan: 49-year-old man admitted with mental status with marked hyperglycemia and generalized seizure activity. Imaging studies of his brain or abnormal with a large midline frontal calcified lesion of unclear type with surrounding edema, as well as a smaller midline calcified lesion more anteriorly and a re-millimeter left insular contrast enhancing lesion. Patient has a history of rectal cancer. Whether intracranial lesions are manifestations of metastasis is unclear at this point. He has not had a recurrent seizure and appears to be tolerating Keppra well.  Recommend neurosurgical sedation or possible biopsy procedure(s) for further characterization of intracranial lesions.  We will continue to follow this patient with you.  C.R. Nicole Kindred, MD [facility] [phone]  07/23/2014  8:30 AM

## 2014-07-23 NOTE — Procedures (Signed)
Extubation Procedure Note  Patient Details:   Name: [name] DOB: 07/17/1965 MRN: [id number]   Airway Documentation:     Evaluation  O2 sats: stable throughout Complications: No apparent complications Patient did tolerate procedure well. Bilateral Breath Sounds: Clear, Diminished   Yes  Placed to 4l/min Raymond  Revonda Standard 07/23/2014, 10:01 AM

## 2014-07-23 NOTE — Progress Notes (Addendum)
PULMONARY / CRITICAL CARE MEDICINE HISTORY AND PHYSICAL EXAMINATION   Name: [name] MRN: [id number] DOB: 03/22/1965    ADMISSION DATE:  07/21/2014  PRIMARY SERVICE: PCCM  CHIEF COMPLAINT:  Headache, seizure  BRIEF PATIENT DESCRIPTION: 49 y/o man with hx of rectal CA s/p resection who developed seizure at home. Found to have calcified intracranial mass  MAJOR EVENTS/TEST RESULTS: 7/09 Head CT: Diffusely calcified 4.1 x 2.2 cm mass at the high right frontoparietal region 7/09 MRI brain: Large densely calcified right frontal lobe mass with surrounding edema. The mass shows peripheral enhancement. It is difficult determine if this is arising from the inferior falx or from the brain however I would favor this is intra-axial. Given history of rectal cancer, this may be heavily calcified metastatic deposits particularly given the history of recent onset of seizure and surrounding edema. Meningioma is a possibility however the calcific pattern is unusual for meningioma. 3 mm enhancing nodule left insula, suspicious for metastatic disease. 8 x 10 mm enhancing nodule anterior falx associated with ossification of the falx. Possible metastatic disease versus enhancing bone marrow in the falx. Bifrontal encephalomalacia consistent with prior head trauma  INDWELLING DEVICES:: ETT 7/09 >> 7/10  MICRO DATA: Urine 7/09 >>  Blood 7/09 >>   ANTIMICROBIALS:  Vanc 7/09 >> 7/09 Pip-tazo 7/09 >> 7/09    SUBJECTIVE:  RASS -2, -3 on propofol. Able to F/C after dose reduced. Tolerating PS 5 cm H2O. Strong cough. Extubated and looks good VITAL SIGNS: Temp:  [98 F (36.7 C)-99.8 F (37.7 C)] 99.8 F (37.7 C) (07/10 0900) Pulse Rate:  [70-89] 78 (07/10 0900) Resp:  [16-24] 16 (07/10 0900) BP: (83-151)/(50-92) 131/88 mmHg (07/10 0900) SpO2:  [98 %-100 %] 100 % (07/10 0900) FiO2 (%):  [30 %-40 %] 30 % (07/10 0900) Weight:  [91.5 kg (201 lb 11.5 oz)] 91.5 kg (201 lb 11.5 oz) (07/10  0430) HEMODYNAMICS:   VENTILATOR SETTINGS: Vent Mode:  [-] PRVC FiO2 (%):  [30 %-40 %] 30 % Set Rate:  [16 bmp] 16 bmp Vt Set:  [510 mL-610 mL] 510 mL PEEP:  [5 cmH20] 5 cmH20 Plateau Pressure:  [20 cmH20-21 cmH20] 20 cmH20 INTAKE / OUTPUT: Intake/Output      07/09 0701 - 07/10 0700 07/10 0701 - 07/11 0700   I.V. (mL/kg) 2688.1 (29.4) 247.8 (2.7)   Other     NG/GT 30    IV Piggyback 250 50   Total Intake(mL/kg) 2968.1 (32.4) 297.8 (3.3)   Urine (mL/kg/hr) 823 (0.4) 100 (0.4)   Emesis/NG output     Total Output 823 100   Net +2145.1 +197.8          PHYSICAL EXAMINATION: General:  RASS -2, + F?C Neuro: No focal deficits HEENT: NCAT, PERRL Cardiovascular:  Reg, no M Lungs:  Few rhonchi Abdomen:  Soft, + BS Ext: no edema, warm   LABS:  CBC  Recent Labs Lab 07/22/14 0322 07/22/14 0545 07/23/14 0332  WBC 6.2 5.9 5.8  HGB 13.5 12.9* 12.4*  HCT 39.0 37.0* 36.1*  PLT 177 180 185   Coag's No results for input(s): APTT, INR in the last 168 hours. BMET  Recent Labs Lab 07/22/14 1119 07/22/14 1605 07/23/14 0332  NA 150* 149* 145  K 3.8 4.2 3.8  CL 119* 120* 116*  CO2 23 21* 19*  BUN 14 14 10   CREATININE 1.54* 1.48* 1.58*  GLUCOSE 193* 214* 246*   Electrolytes  Recent Labs Lab 07/22/14 1119 07/22/14 1605 07/23/14 0332  CALCIUM 9.1 9.0 8.8*   Sepsis Markers  Recent Labs Lab 07/22/14 0222 07/22/14 0322 07/22/14 0545 07/23/14 0332  LATICACIDVEN 2.45*  --  3.0*  --   PROCALCITON  --  0.18  --  0.13   ABG  Recent Labs Lab 07/22/14 0438  PHART 7.291*  PCO2ART 41.3  PO2ART 262.0*   Liver Enzymes  Recent Labs Lab 07/21/14 2130  AST 32  ALT 44  ALKPHOS 151*  BILITOT 0.7  ALBUMIN 4.1   Cardiac Enzymes No results for input(s): TROPONINI, PROBNP in the last 168 hours. Glucose  Recent Labs Lab 07/22/14 1134 07/22/14 1210 07/22/14 1801 07/22/14 1942 07/22/14 2335 07/23/14 0332  GLUCAP 146* 161* 225* 236* 183* 229*    CXR:  Low volumes, NAD   ASSESSMENT / PLAN: NEUROLOGIC A: New onset seizures Post ictal state - resolved Multpile brain lesions - concern for metastases P:   RASS goal: 0 Continue keppra - change to IV after extubation PRN lorazepam for seizure Neurology following - they have recommended NS consultation for possible brain bx NS consultation called 7/10 - Dr Christella Noa to see ADD: confused and slightly agitated post extubation. Dexmedetomidine initiated  PULMONARY A: Acute respiratory failure - intubated for AMS P:   Extubate Supp O2 Monitor in ICU post extubation  CARDIOVASCULAR A: No active issues P:   Monitor  RENAL A: AKI P:   Monitor BMET intermittently Monitor I/Os Correct electrolytes as indicated  GASTROINTESTINAL A: No active issues P:   SUP: N/I post extubation Advance diet as tolerated post extubation  HEMATOLOGIC A: No issues P:   DVT px: SCDs (SQ heparin DC'd 7/10) Monitor CBC intermittently Transfuse per usual guidelines  INFECTIOUS A: No overt infection P:   Monitor temp, WBC count Micro and abx as above  ENDOCRINE A: HONK - resolved DM 2 P:   Cont Lantus Cont SSI - adjust to ACHS when taking diet   CCM X 45 mins  Merton Border, MD ; [facility] service Mobile [phone].  After 5:30 PM or weekends, call [phone]   07/23/2014, 9:52 AM

## 2014-07-24 LAB — BASIC METABOLIC PANEL
Anion gap: 9 (ref 5–15)
BUN: 9 mg/dL (ref 6–20)
CO2: 17 mmol/L — ABNORMAL LOW (ref 22–32)
Calcium: 8.7 mg/dL — ABNORMAL LOW (ref 8.9–10.3)
Chloride: 117 mmol/L — ABNORMAL HIGH (ref 101–111)
Creatinine, Ser: 1.52 mg/dL — ABNORMAL HIGH (ref 0.61–1.24)
GFR calc Af Amer: >60 mL/min (ref >60)
GFR calc non Af Amer: 53 mL/min — ABNORMAL LOW (ref >60)
Glucose, Bld: 178 mg/dL — ABNORMAL HIGH (ref 65–99)
Potassium: 5 mmol/L (ref 3.5–5.1)
Sodium: 143 mmol/L (ref 135–145)

## 2014-07-24 LAB — GLUCOSE, CAPILLARY
GLUCOSE-CAPILLARY: 266 mg/dL — AB (ref 65–99)
GLUCOSE-CAPILLARY: 159 mg/dL — AB (ref 65–99)
GLUCOSE-CAPILLARY: 214 mg/dL — AB (ref 65–99)
Glucose-Capillary: 150 mg/dL — ABNORMAL HIGH (ref 65–99)
Glucose-Capillary: 162 mg/dL — ABNORMAL HIGH (ref 65–99)
Glucose-Capillary: 166 mg/dL — ABNORMAL HIGH (ref 65–99)
Glucose-Capillary: 174 mg/dL — ABNORMAL HIGH (ref 65–99)
Glucose-Capillary: 234 mg/dL — ABNORMAL HIGH (ref 65–99)

## 2014-07-24 MED ORDER — HYDRALAZINE HCL 20 MG/ML IJ SOLN
10.0000 mg | INTRAMUSCULAR | Status: DC | PRN
  Administered 2014-07-24 – 2014-07-26 (×5): 10 mg via INTRAVENOUS
  Filled 2014-07-24 (×4): qty 1

## 2014-07-24 MED ORDER — INSULIN ASPART 100 UNIT/ML ~~LOC~~ SOLN
0.0000 [IU] | Freq: Three times a day (TID) | SUBCUTANEOUS | Status: DC
  Administered 2014-07-24: 3 [IU] via SUBCUTANEOUS
  Administered 2014-07-25 (×2): 7 [IU] via SUBCUTANEOUS
  Administered 2014-07-25: 4 [IU] via SUBCUTANEOUS
  Administered 2014-07-26: 20 [IU] via SUBCUTANEOUS
  Administered 2014-07-26: 11 [IU] via SUBCUTANEOUS
  Administered 2014-07-26: 15 [IU] via SUBCUTANEOUS

## 2014-07-24 MED ORDER — AMLODIPINE BESYLATE 2.5 MG PO TABS
2.5000 mg | ORAL_TABLET | Freq: Every day | ORAL | Status: DC
  Filled 2014-07-24: qty 1

## 2014-07-24 MED ORDER — AMLODIPINE BESYLATE 10 MG PO TABS
10.0000 mg | ORAL_TABLET | Freq: Every day | ORAL | Status: DC
  Administered 2014-07-25 – 2014-07-27 (×3): 10 mg via ORAL
  Filled 2014-07-24 (×4): qty 1

## 2014-07-24 NOTE — Progress Notes (Signed)
PULMONARY / CRITICAL CARE MEDICINE  Name: NANCY MANUELE MRN: [id number] DOB: [birth year]    ADMISSION DATE:  07/21/2014  PRIMARY SERVICE: PCCM  CHIEF COMPLAINT:  Headache, seizure  BRIEF PATIENT DESCRIPTION: 49 y/o man with hx of rectal CA s/p resection who developed seizure at home. Found to have calcified intracranial mass  MAJOR EVENTS/TEST RESULTS: 7/09 Head CT: Diffusely calcified 4.1 x 2.2 cm mass at the high right frontoparietal region 7/09 MRI brain: Large densely calcified right frontal lobe mass with surrounding edema. The mass shows peripheral enhancement. It is difficult determine if this is arising from the inferior falx or from the brain however I would favor this is intra-axial. Given history of rectal cancer, this may be heavily calcified metastatic deposits particularly given the history of recent onset of seizure and surrounding edema. Meningioma is a possibility however the calcific pattern is unusual for meningioma. 3 mm enhancing nodule left insula, suspicious for metastatic disease. 8 x 10 mm enhancing nodule anterior falx associated with ossification of the falx. Possible metastatic disease versus enhancing bone marrow in the falx. Bifrontal encephalomalacia consistent with prior head trauma  INDWELLING DEVICES:: ETT 7/09 >> 7/10  MICRO DATA: Urine 7/09 >> 5,00 colonies/ml (insignificant growth) Blood 7/09 >>   ANTIMICROBIALS:  Vanc 7/09 >> 7/09 Pip-tazo 7/09 >> 7/09  SUBJECTIVE:  RASS -1 on precedex. 2L [place]. No increased WOB. Asking about eating. Speech is slow but clear/not pressured. No acute events overnight.  VITAL SIGNS: Temp:  [97.7 F (36.5 C)-98.7 F (37.1 C)] 98.4 F (36.9 C) (07/11 1142) Pulse Rate:  [31-77] 67 (07/11 1100) Resp:  [17-36] 32 (07/11 1100) BP: (114-168)/(73-98) 160/92 mmHg (07/11 1100) SpO2:  [86 %-100 %] 98 % (07/11 1100) HEMODYNAMICS:   VENTILATOR SETTINGS:   INTAKE / OUTPUT: Intake/Output      07/10 0701 - 07/11 0700  07/11 0701 - 07/12 0700   I.V. (mL/kg) 2861.6 (31.3) 341.1 (3.7)   NG/GT     IV Piggyback 155 105   Total Intake(mL/kg) 3016.6 (33) 446.1 (4.9)   Urine (mL/kg/hr) 675 (0.3)    Stool 0 (0)    Total Output 675     Net +2341.6 +446.1        Urine Occurrence 775 x    Stool Occurrence 2 x      PHYSICAL EXAMINATION: General:  RASS -1, NAD Neuro: No focal deficits, speaks slowly but clearly HEENT: NCAT, PERRL, EOMI, Nasal cannula in place Cardiovascular:  Reg, no M Lungs:  Some crackles bilat otherwise clear, norm WOB Abdomen:  Soft, nontender, +BS Ext: no edema, warm   LABS:  CBC  Recent Labs Lab 07/22/14 0322 07/22/14 0545 07/23/14 0332  WBC 6.2 5.9 5.8  HGB 13.5 12.9* 12.4*  HCT 39.0 37.0* 36.1*  PLT 177 180 185   Coag's No results for input(s): APTT, INR in the last 168 hours. BMET  Recent Labs Lab 07/22/14 1605 07/23/14 0332 07/24/14 0238  NA 149* 145 143  K 4.2 3.8 5.0  CL 120* 116* 117*  CO2 21* 19* 17*  BUN 14 10 9   CREATININE 1.48* 1.58* 1.52*  GLUCOSE 214* 246* 178*   Electrolytes  Recent Labs Lab 07/22/14 1605 07/23/14 0332 07/24/14 0238  CALCIUM 9.0 8.8* 8.7*   Sepsis Markers  Recent Labs Lab 07/22/14 0222 07/22/14 0322 07/22/14 0545 07/23/14 0332  LATICACIDVEN 2.45*  --  3.0*  --   PROCALCITON  --  0.18  --  0.13   ABG  Recent Labs  Lab 07/22/14 0438  PHART 7.291*  PCO2ART 41.3  PO2ART 262.0*   Liver Enzymes  Recent Labs Lab 07/21/14 2130  AST 32  ALT 44  ALKPHOS 151*  BILITOT 0.7  ALBUMIN 4.1   Cardiac Enzymes No results for input(s): TROPONINI, PROBNP in the last 168 hours. Glucose  Recent Labs Lab 07/23/14 0332 07/23/14 1631 07/23/14 1952 07/23/14 2339 07/24/14 0340 07/24/14 0803  GLUCAP 229* 293* 245* 174* 166* 162*    CXR: Low volumes, NAD   ASSESSMENT / PLAN: NEUROLOGIC A: New onset seizures  - had been seizure free for ~84yrs Post ictal state - resolved Multpile brain lesions - concern  for mets P:   RASS goal: 0 Keppra PRN lorazepam for seizure Neurology following - recommended NS consult for possible brain bx NS consultation called 7/10 - Dr Christella Noa to see DC Precedex  PULMONARY A: Acute respiratory failure - intubated for AMS P:   Extubated 7/10 Supp O2 - 2L   CARDIOVASCULAR A: No active issues P:   Monitor  RENAL A: AKI P:   Monitor BMP Monitor I/Os Correct electrolytes as indicated DC foley IVSL  GASTROINTESTINAL A: No active issues P:   Advance diet as tolerated - carb mod Nursing to perform bedside swallow Zofran PRN nausea  HEMATOLOGIC A: No issues P:   DVT px: SCDs (SQ heparin DC'd 7/10) Monitor CBC intermittently Transfuse per usual guidelines  INFECTIOUS A: No overt infection P:   Monitor temp, WBC count  Micro and abx as above  ENDOCRINE A: HONK - resolved DM 2 P:   Cont Lantus 40u SSI - resistant     Elberta Leatherwood, MD,MS,  PGY2 07/24/2014 12:29 PM

## 2014-07-24 NOTE — Progress Notes (Signed)
Subjective: Awake and alert.  No further seizures overnight.  Has no complaints. Remains on Keppra.  Objective: Current vital signs: BP 146/98 mmHg  Pulse 71  Temp(Src) 98.4 F (36.9 C) (Oral)  Resp 33  Ht 5\' 6"  (1.676 m)  Wt 91.5 kg (201 lb 11.5 oz)  BMI 32.57 kg/m2  SpO2 99% Vital signs in last 24 hours: Temp:  [97.7 F (36.5 C)-99.8 F (37.7 C)] 98.4 F (36.9 C) (07/11 0805) Pulse Rate:  [31-92] 71 (07/11 0700) Resp:  [17-40] 33 (07/11 0300) BP: (114-163)/(73-113) 146/98 mmHg (07/11 0700) SpO2:  [86 %-100 %] 99 % (07/11 0700)  Intake/Output from previous day: 07/10 0701 - 07/11 0700 In: 2816.6 [I.V.:2661.6; IV Piggyback:155] Out: 675 [Urine:675] Intake/Output this shift: Total I/O In: 27.4 [I.V.:27.4] Out: -  Nutritional status:    Neurologic Exam:  Mental Status: Alert, oriented, thought content appropriate.  Speech fluent without evidence of aphasia.  Able to follow 3 step commands without difficulty. Cranial Nerves: II:  Visual fields grossly normal, pupils equal, round, reactive to light and accommodation III,IV, VI: ptosis not present, extra-ocular motions intact bilaterally V,VII: smile symmetric, facial light touch sensation normal bilaterally VIII: hearing normal bilaterally IX,X: uvula rises symmetrically XI: bilateral shoulder shrug XII: midline tongue extension without atrophy or fasciculations Motor: Right : Upper extremity   5/5    Left:     Upper extremity   5/5  Lower extremity   5/5     Lower extremity   5/5 Tone and bulk:normal tone throughout; no atrophy noted Sensory: Pinprick and light touch intact throughout, bilaterally Deep Tendon Reflexes:  Right: Upper Extremity   Left: Upper extremity   biceps (C-5 to C-6) 1/4   biceps (C-5 to C-6) 1/4 tricep (C7) 1/4    triceps (C7) 1/4 Brachioradialis (C6) 1/4  Brachioradialis (C6) 1/4  Lower Extremity Lower Extremity  quadriceps (L-2 to L-4) 1/4   quadriceps (L-2 to L-4) 1/4 Achilles (S1)  0/4   Achilles (S1) 0/4  Plantars: Right: downgoing   Left: downgoing Cerebellar: normal finger-to-nose and normal heel-to-shin testing bilaterally   Lab Results: Basic Metabolic Panel:  Recent Labs Lab 07/22/14 0545 07/22/14 1119 07/22/14 1605 07/23/14 0332 07/24/14 0238  NA 144 150* 149* 145 143  K 3.6 3.8 4.2 3.8 5.0  CL 113* 119* 120* 116* 117*  CO2 22 23 21* 19* 17*  GLUCOSE 520* 193* 214* 246* 178*  BUN 15 14 14 10 9   CREATININE 1.53* 1.54* 1.48* 1.58* 1.52*  CALCIUM 8.9 9.1 9.0 8.8* 8.7*    Liver Function Tests:  Recent Labs Lab 07/21/14 2130  AST 32  ALT 44  ALKPHOS 151*  BILITOT 0.7  PROT 7.4  ALBUMIN 4.1   No results for input(s): LIPASE, AMYLASE in the last 168 hours. No results for input(s): AMMONIA in the last 168 hours.  CBC:  Recent Labs Lab 07/21/14 2130 07/22/14 0322 07/22/14 0545 07/23/14 0332  WBC 4.4 6.2 5.9 5.8  NEUTROABS 2.9  --   --   --   HGB 14.0 13.5 12.9* 12.4*  HCT 40.2 39.0 37.0* 36.1*  MCV 78.5 76.9* 77.1* 79.3  PLT 193 177 180 185    Cardiac Enzymes: No results for input(s): CKTOTAL, CKMB, CKMBINDEX, TROPONINI in the last 168 hours.  Lipid Panel: No results for input(s): CHOL, TRIG, HDL, CHOLHDL, VLDL, LDLCALC in the last 168 hours.  CBG:  Recent Labs Lab 07/23/14 1631 07/23/14 1952 07/23/14 2339 07/24/14 0340 07/24/14 0803  GLUCAP 293* 245*  174* 21* 162*    Microbiology: Results for orders placed or performed during the hospital encounter of 07/21/14  Blood Culture (routine x 2)     Status: None (Preliminary result)   Collection Time: 07/21/14  9:19 PM  Result Value Ref Range Status   Specimen Description BLOOD RIGHT ARM  Final   Special Requests BOTTLES DRAWN AEROBIC ONLY 5CC  Final   Culture NO GROWTH 2 DAYS  Final   Report Status PENDING  Incomplete  Blood Culture (routine x 2)     Status: None (Preliminary result)   Collection Time: 07/21/14  9:26 PM  Result Value Ref Range Status   Specimen  Description BLOOD RIGHT FOREARM  Final   Special Requests BOTTLES DRAWN AEROBIC ONLY 5CC  Final   Culture NO GROWTH 2 DAYS  Final   Report Status PENDING  Incomplete  Urine culture     Status: None   Collection Time: 07/22/14 12:54 AM  Result Value Ref Range Status   Specimen Description URINE, CLEAN CATCH  Final   Special Requests NONE  Final   Culture 5,000 COLONIES/mL INSIGNIFICANT GROWTH  Final   Report Status 07/23/2014 FINAL  Final  MRSA PCR Screening     Status: None   Collection Time: 07/22/14  4:10 AM  Result Value Ref Range Status   MRSA by PCR NEGATIVE NEGATIVE Final    Comment:        The GeneXpert MRSA Assay (FDA approved for NASAL specimens only), is one component of a comprehensive MRSA colonization surveillance program. It is not intended to diagnose MRSA infection nor to guide or monitor treatment for MRSA infections.     Coagulation Studies: No results for input(s): LABPROT, INR in the last 72 hours.  Imaging: Mr Jeri Cos PO Contrast  07/22/2014   CLINICAL DATA:  New onset seizure. Calcified lung mass. History of head trauma as a child  EXAM: MRI HEAD WITHOUT AND WITH CONTRAST  TECHNIQUE: Multiplanar, multiecho pulse sequences of the brain and surrounding structures were obtained without and with intravenous contrast.  CONTRAST:  6mL MULTIHANCE GADOBENATE DIMEGLUMINE 529 MG/ML IV SOLN  COMPARISON:  CT head 07/21/2014  FINDINGS: Right medial frontal calcified brain mass has surrounding edema. It is difficult determine if this is intra-axial or extra-axial. The mass is densely calcified on CT and is adjacent to the inferior falx and crosses under the falx to the left of midline. Overall I would favor this is intra-axial. The mass probably is in the cingulate gyrus. There is a moderate amount of surrounding edema. Following contrast infusion, there is peripheral enhancement with a lobular pattern. The mass measures approximately 35 x 18 mm on axial images.  Additional 8  x 10 mm enhancing lesion in the anterior falx. This also is calcified but has more of the appearance of benign calcification of the falx. Metastatic disease not excluded  3 mm enhancing nodule left insula, probable  metastatic disease.  Encephalomalacia in the inferior frontal lobes bilaterally consistent with a history of prior head trauma as a child.  Ventricle size normal.  Negative for acute infarct.  Gradient echo imaging reveals susceptibility throughout the calcified mass in the right frontal lobe. Additional small areas of susceptibility in the right posterior temporal lobe may represent chronic hemorrhage or mineralization but are not calcified on CT.  No other mass lesions identified.  IMPRESSION: Large densely calcified right frontal lobe mass with surrounding edema. The mass shows peripheral enhancement. It is difficult determine if this  is arising from the inferior falx or from the brain however I would favor this is intra-axial. Given history of rectal cancer, this may be heavily calcified metastatic deposits particularly given the history of recent onset of seizure and surrounding edema. Meningioma is a possibility however the calcific pattern is unusual for meningioma.  3 mm enhancing nodule left insula, suspicious for metastatic disease.  8 x 10 mm enhancing nodule anterior falx associated with ossification of the falx. Possible metastatic disease versus enhancing bone marrow in the falx.  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   Electronically Signed   By: Franchot Gallo M.D.   On: 07/22/2014 15:42   Dg Chest Port 1 View  07/23/2014   CLINICAL DATA:  Joint replacement and respiratory failure  EXAM: PORTABLE CHEST - 1 VIEW  COMPARISON:  Yesterday  FINDINGS: Very low lung volumes persist. Endotracheal tube advanced, now with its tip 3.2 cm from the carina. NG tube stable. Increasing bibasilar atelectasis. No pneumothorax.  IMPRESSION: Increasing bibasilar atelectasis.    Electronically Signed   By: Marybelle Killings M.D.   On: 07/23/2014 08:06    Medications:  Scheduled: . antiseptic oral rinse  7 mL Mouth Rinse BID  . insulin aspart  0-20 Units Subcutaneous 6 times per day  . insulin glargine  40 Units Subcutaneous Daily  . levETIRAcetam  500 mg Intravenous Q12H    Etta Quill PA-C [facility] [phone]  07/24/2014, 9:49 AM   Patient seen and examined.  Clinical course and management discussed.  Necessary edits performed.  I agree with the above.  Assessment and plan of care developed and discussed below.     Assessment/Plan:  49 year old male with new onset seizure.  Patient in DKA at presentation.  MRI brain revealed showing large midline frontal calcified lesion of unclear type with surrounding edema, as well as a smaller midline calcified lesion more anteriorly and a 4mm left insular contrast enhancing lesion.  Unclear if lesion is a metastasis at this time.  Although edema noted will not start steroids at this time since blood sugars have been an issue, particularly since no further seizures noted.  EEG shows no epileptiform activity.  Awaiting neurosurgery consult.  Recommendations: 1.  Continue Keppra at current dose. 2.  Will continue to follow with you.     Alexis Goodell, MD [facility] [phone]  07/24/2014  12:20 PM

## 2014-07-25 ENCOUNTER — Inpatient Hospital Stay (HOSPITAL_COMMUNITY): Payer: BLUE CROSS/BLUE SHIELD

## 2014-07-25 LAB — BASIC METABOLIC PANEL
Anion gap: 14 (ref 5–15)
BUN: 8 mg/dL (ref 6–20)
CALCIUM: 9.1 mg/dL (ref 8.9–10.3)
CO2: 16 mmol/L — ABNORMAL LOW (ref 22–32)
Chloride: 109 mmol/L (ref 101–111)
Creatinine, Ser: 1.29 mg/dL — ABNORMAL HIGH (ref 0.61–1.24)
GFR calc non Af Amer: >60 mL/min (ref >60)
Glucose, Bld: 216 mg/dL — ABNORMAL HIGH (ref 65–99)
POTASSIUM: 3.7 mmol/L (ref 3.5–5.1)
SODIUM: 139 mmol/L (ref 135–145)

## 2014-07-25 LAB — POCT I-STAT 3, ART BLOOD GAS (G3+)
ACID-BASE DEFICIT: 6 mmol/L — AB (ref 0.0–2.0)
Bicarbonate: 16.2 mEq/L — ABNORMAL LOW (ref 20.0–24.0)
O2 SAT: 94 %
PO2 ART: 66 mmHg — AB (ref 80.0–100.0)
TCO2: 17 mmol/L (ref 0–100)
pCO2 arterial: 24.4 mmHg — ABNORMAL LOW (ref 35.0–45.0)
pH, Arterial: 7.43 (ref 7.350–7.450)

## 2014-07-25 LAB — URINALYSIS, ROUTINE W REFLEX MICROSCOPIC
Bilirubin Urine: NEGATIVE
GLUCOSE, UA: 500 mg/dL — AB
Ketones, ur: 40 mg/dL — AB
LEUKOCYTES UA: NEGATIVE
NITRITE: NEGATIVE
PH: 5 (ref 5.0–8.0)
PROTEIN: NEGATIVE mg/dL
Specific Gravity, Urine: 1.012 (ref 1.005–1.030)
Urobilinogen, UA: 0.2 mg/dL (ref 0.0–1.0)

## 2014-07-25 LAB — RENAL FUNCTION PANEL
ANION GAP: 13 (ref 5–15)
Albumin: 3.1 g/dL — ABNORMAL LOW (ref 3.5–5.0)
BUN: 9 mg/dL (ref 6–20)
CALCIUM: 8.8 mg/dL — AB (ref 8.9–10.3)
CO2: 17 mmol/L — ABNORMAL LOW (ref 22–32)
Chloride: 111 mmol/L (ref 101–111)
Creatinine, Ser: 1.43 mg/dL — ABNORMAL HIGH (ref 0.61–1.24)
GFR, EST NON AFRICAN AMERICAN: 57 mL/min — AB (ref >60)
Glucose, Bld: 219 mg/dL — ABNORMAL HIGH (ref 65–99)
Phosphorus: 4.1 mg/dL (ref 2.5–4.6)
Potassium: 3.3 mmol/L — ABNORMAL LOW (ref 3.5–5.1)
Sodium: 141 mmol/L (ref 135–145)

## 2014-07-25 LAB — CBC WITH DIFFERENTIAL/PLATELET
Basophils Absolute: 0 10*3/uL (ref 0.0–0.1)
Basophils Relative: 0 % (ref 0–1)
EOS PCT: 0 % (ref 0–5)
Eosinophils Absolute: 0 10*3/uL (ref 0.0–0.7)
HEMATOCRIT: 35.7 % — AB (ref 39.0–52.0)
HEMOGLOBIN: 12.4 g/dL — AB (ref 13.0–17.0)
Lymphocytes Relative: 14 % (ref 12–46)
Lymphs Abs: 0.9 10*3/uL (ref 0.7–4.0)
MCH: 26.7 pg (ref 26.0–34.0)
MCHC: 34.7 g/dL (ref 30.0–36.0)
MCV: 76.8 fL — ABNORMAL LOW (ref 78.0–100.0)
MONOS PCT: 7 % (ref 3–12)
Monocytes Absolute: 0.4 10*3/uL (ref 0.1–1.0)
NEUTROS ABS: 5 10*3/uL (ref 1.7–7.7)
NEUTROS PCT: 78 % — AB (ref 43–77)
PLATELETS: 155 10*3/uL (ref 150–400)
RBC: 4.65 MIL/uL (ref 4.22–5.81)
RDW: 14 % (ref 11.5–15.5)
WBC: 6.3 10*3/uL (ref 4.0–10.5)

## 2014-07-25 LAB — GLUCOSE, CAPILLARY
GLUCOSE-CAPILLARY: 194 mg/dL — AB (ref 65–99)
GLUCOSE-CAPILLARY: 266 mg/dL — AB (ref 65–99)
Glucose-Capillary: 230 mg/dL — ABNORMAL HIGH (ref 65–99)
Glucose-Capillary: 229 mg/dL — ABNORMAL HIGH (ref 65–99)
Glucose-Capillary: 223 mg/dL — ABNORMAL HIGH (ref 65–99)

## 2014-07-25 LAB — URINE MICROSCOPIC-ADD ON

## 2014-07-25 MED ORDER — INSULIN DETEMIR 100 UNIT/ML ~~LOC~~ SOLN
25.0000 [IU] | Freq: Two times a day (BID) | SUBCUTANEOUS | Status: DC
  Administered 2014-07-25 (×2): 25 [IU] via SUBCUTANEOUS
  Filled 2014-07-25 (×4): qty 0.25

## 2014-07-25 MED ORDER — ADULT MULTIVITAMIN W/MINERALS CH
1.0000 | ORAL_TABLET | Freq: Every day | ORAL | Status: DC
  Administered 2014-07-25 – 2014-07-27 (×3): 1 via ORAL
  Filled 2014-07-25 (×3): qty 1

## 2014-07-25 MED ORDER — LORAZEPAM 2 MG/ML IJ SOLN
1.0000 mg | Freq: Four times a day (QID) | INTRAMUSCULAR | Status: DC | PRN
  Administered 2014-07-25: 1 mg via INTRAVENOUS
  Administered 2014-07-25: 0.5 mg via INTRAVENOUS
  Filled 2014-07-25 (×2): qty 1

## 2014-07-25 MED ORDER — METOPROLOL TARTRATE 12.5 MG HALF TABLET
12.5000 mg | ORAL_TABLET | Freq: Two times a day (BID) | ORAL | Status: DC
  Filled 2014-07-25 (×2): qty 1

## 2014-07-25 MED ORDER — CLONIDINE HCL 0.1 MG PO TABS
0.1000 mg | ORAL_TABLET | Freq: Three times a day (TID) | ORAL | Status: DC
  Administered 2014-07-25 – 2014-07-27 (×7): 0.1 mg via ORAL
  Filled 2014-07-25 (×10): qty 1

## 2014-07-25 MED ORDER — ONDANSETRON HCL 4 MG/2ML IJ SOLN: 4.0000 mg | Freq: Four times a day (QID) | INTRAMUSCULAR | Status: DC | PRN

## 2014-07-25 MED ORDER — FOLIC ACID 1 MG PO TABS
1.0000 mg | ORAL_TABLET | Freq: Every day | ORAL | Status: DC
  Administered 2014-07-25 – 2014-07-27 (×3): 1 mg via ORAL
  Filled 2014-07-25 (×3): qty 1

## 2014-07-25 MED ORDER — LORAZEPAM 0.5 MG PO TABS
0.5000 mg | ORAL_TABLET | Freq: Once | ORAL | Status: AC
  Administered 2014-07-25: 0.5 mg via ORAL
  Filled 2014-07-25: qty 1

## 2014-07-25 MED ORDER — PNEUMOCOCCAL VAC POLYVALENT 25 MCG/0.5ML IJ INJ
0.5000 mL | INJECTION | INTRAMUSCULAR | Status: AC
  Administered 2014-07-26: 0.5 mL via INTRAMUSCULAR
  Filled 2014-07-25: qty 0.5

## 2014-07-25 MED ORDER — INSULIN ASPART 100 UNIT/ML ~~LOC~~ SOLN
4.0000 [IU] | Freq: Three times a day (TID) | SUBCUTANEOUS | Status: DC
  Administered 2014-07-25 – 2014-07-27 (×6): 4 [IU] via SUBCUTANEOUS

## 2014-07-25 MED ORDER — DEXAMETHASONE 4 MG PO TABS
4.0000 mg | ORAL_TABLET | Freq: Three times a day (TID) | ORAL | Status: DC
  Administered 2014-07-25 – 2014-07-27 (×6): 4 mg via ORAL
  Filled 2014-07-25 (×9): qty 1

## 2014-07-25 MED ORDER — POTASSIUM CHLORIDE CRYS ER 20 MEQ PO TBCR
20.0000 meq | EXTENDED_RELEASE_TABLET | Freq: Once | ORAL | Status: AC
  Administered 2014-07-25: 20 meq via ORAL
  Filled 2014-07-25: qty 1

## 2014-07-25 MED ORDER — LORAZEPAM 1 MG PO TABS: 1.0000 mg | ORAL_TABLET | Freq: Four times a day (QID) | ORAL | Status: DC | PRN

## 2014-07-25 MED ORDER — VITAMIN B-1 100 MG PO TABS
100.0000 mg | ORAL_TABLET | Freq: Every day | ORAL | Status: DC
  Administered 2014-07-25 – 2014-07-27 (×3): 100 mg via ORAL
  Filled 2014-07-25 (×3): qty 1

## 2014-07-25 MED ORDER — POTASSIUM CHLORIDE 10 MEQ/100ML IV SOLN
10.0000 meq | INTRAVENOUS | Status: AC
  Administered 2014-07-25 (×2): 10 meq via INTRAVENOUS
  Filled 2014-07-25 (×2): qty 100

## 2014-07-25 MED ORDER — METOPROLOL TARTRATE 12.5 MG HALF TABLET
12.5000 mg | ORAL_TABLET | Freq: Two times a day (BID) | ORAL | Status: DC
Start: 2014-07-25 — End: 2014-07-25

## 2014-07-25 NOTE — Progress Notes (Signed)
Dr Ashok Cordia made aware of incr RR, HR, and slight fever. Will monitor and get EKG

## 2014-07-25 NOTE — Progress Notes (Signed)
Subjective: No seizures overnight.  Feeling well.  Tolerating Keppra well.  Has been incontinent of urine.  Objective: Current vital signs: BP 201/103 mmHg  Pulse 110  Temp(Src) 97.8 F (36.6 C) (Oral)  Resp 28  Ht 5\' 6"  (1.676 m)  Wt 91.5 kg (201 lb 11.5 oz)  BMI 32.57 kg/m2  SpO2 96% Vital signs in last 24 hours: Temp:  [97.8 F (36.6 C)-101.6 F (38.7 C)] 97.8 F (36.6 C) (07/12 0819) Pulse Rate:  [67-122] 110 (07/12 0700) Resp:  [11-45] 28 (07/12 0700) BP: (125-202)/(53-146) 201/103 mmHg (07/12 0700) SpO2:  [93 %-100 %] 96 % (07/12 0700)  Intake/Output from previous day: 07/11 0701 - 07/12 0700 In: 1598.5 [P.O.:620; I.V.:568.5; IV Piggyback:410] Out: 1000 [Urine:1000] Intake/Output this shift: Total I/O In: -  Out: 600 [Urine:600] Nutritional status: Diet NPO time specified  Neurologic Exam:  Mental Status: Alert, oriented to hospital, county, state, year, month, day.   thought content appropriate.  Speech fluent without evidence of aphasia.  Able to follow 3 step commands without difficulty.  Anxious. Cranial Nerves: II: Visual fields grossly normal, pupils equal, round, reactive to light and accommodation III,IV, VI: ptosis not present, extra-ocular motions intact bilaterally V,VII: smile symmetric, facial light touch sensation normal bilaterally VIII: hearing normal bilaterally IX,X: uvula rises symmetrically XI: bilateral shoulder shrug XII: midline tongue extension without atrophy or fasciculations Motor: Right : Upper extremity   5/5    Left:     Upper extremity   5/5  Lower extremity   5/5     Lower extremity   5/5 Tone and bulk:normal tone throughout; no atrophy noted Sensory: Pinprick and light touch intact throughout, bilaterally Deep Tendon Reflexes:  1+ throughout and no AJ Plantars: Right: downgoing   Left: downgoing Cerebellar: normal finger-to-nose,  normal heel-to-shin test    Lab Results: Basic Metabolic Panel:  Recent Labs Lab  07/22/14 1119 07/22/14 1605 07/23/14 0332 07/24/14 0238 07/25/14 0208  NA 150* 149* 145 143 141  K 3.8 4.2 3.8 5.0 3.3*  CL 119* 120* 116* 117* 111  CO2 23 21* 19* 17* 17*  GLUCOSE 193* 214* 246* 178* 219*  BUN 14 14 10 9 9   CREATININE 1.54* 1.48* 1.58* 1.52* 1.43*  CALCIUM 9.1 9.0 8.8* 8.7* 8.8*  PHOS  --   --   --   --  4.1    Liver Function Tests:  Recent Labs Lab 07/21/14 2130 07/25/14 0208  AST 32  --   ALT 44  --   ALKPHOS 151*  --   BILITOT 0.7  --   PROT 7.4  --   ALBUMIN 4.1 3.1*   No results for input(s): LIPASE, AMYLASE in the last 168 hours. No results for input(s): AMMONIA in the last 168 hours.  CBC:  Recent Labs Lab 07/21/14 2130 07/22/14 0322 07/22/14 0545 07/23/14 0332 07/25/14 0208  WBC 4.4 6.2 5.9 5.8 6.3  NEUTROABS 2.9  --   --   --  5.0  HGB 14.0 13.5 12.9* 12.4* 12.4*  HCT 40.2 39.0 37.0* 36.1* 35.7*  MCV 78.5 76.9* 77.1* 79.3 76.8*  PLT 193 177 180 185 155    Cardiac Enzymes: No results for input(s): CKTOTAL, CKMB, CKMBINDEX, TROPONINI in the last 168 hours.  Lipid Panel: No results for input(s): CHOL, TRIG, HDL, CHOLHDL, VLDL, LDLCALC in the last 168 hours.  CBG:  Recent Labs Lab 07/24/14 1140 07/24/14 1643 07/24/14 2034 07/24/14 2317 07/25/14 0825  GLUCAP 150* 159* 214* 230* 229*  Microbiology: Results for orders placed or performed during the hospital encounter of 07/21/14  Blood Culture (routine x 2)     Status: None (Preliminary result)   Collection Time: 07/21/14  9:19 PM  Result Value Ref Range Status   Specimen Description BLOOD RIGHT ARM  Final   Special Requests BOTTLES DRAWN AEROBIC ONLY 5CC  Final   Culture NO GROWTH 3 DAYS  Final   Report Status PENDING  Incomplete  Blood Culture (routine x 2)     Status: None (Preliminary result)   Collection Time: 07/21/14  9:26 PM  Result Value Ref Range Status   Specimen Description BLOOD RIGHT FOREARM  Final   Special Requests BOTTLES DRAWN AEROBIC ONLY 5CC   Final   Culture NO GROWTH 3 DAYS  Final   Report Status PENDING  Incomplete  Urine culture     Status: None   Collection Time: 07/22/14 12:54 AM  Result Value Ref Range Status   Specimen Description URINE, CLEAN CATCH  Final   Special Requests NONE  Final   Culture 5,000 COLONIES/mL INSIGNIFICANT GROWTH  Final   Report Status 07/23/2014 FINAL  Final  MRSA PCR Screening     Status: None   Collection Time: 07/22/14  4:10 AM  Result Value Ref Range Status   MRSA by PCR NEGATIVE NEGATIVE Final    Comment:        The GeneXpert MRSA Assay (FDA approved for NASAL specimens only), is one component of a comprehensive MRSA colonization surveillance program. It is not intended to diagnose MRSA infection nor to guide or monitor treatment for MRSA infections.     Coagulation Studies: No results for input(s): LABPROT, INR in the last 72 hours.  Imaging: Dg Chest Port 1 View  07/25/2014   CLINICAL DATA:  Fever, history of rectal malignancy, brain mass, and acute respiratory failure.  EXAM: PORTABLE CHEST - 1 VIEW  COMPARISON:  Portable chest x-ray of July 23, 2014  FINDINGS: The esophagus and trachea have been extubated. The lungs are well-expanded. There are mildly prominent interstitial markings at the left lung base. There is no significant pleural effusion. The cardiac silhouette is enlarged. The pulmonary vascularity is not engorged. The bony thorax exhibits no acute abnormality.  IMPRESSION: 1. The lungs are well-expanded. There is left lower lobe atelectasis or pneumonia. 2. There is cardiomegaly without pulmonary edema.   Electronically Signed   By: David  Martinique M.D.   On: 07/25/2014 09:09    Medications:  Scheduled: . amLODipine  10 mg Oral Daily  . antiseptic oral rinse  7 mL Mouth Rinse BID  . folic acid  1 mg Oral Daily  . insulin aspart  0-20 Units Subcutaneous TID WC  . insulin glargine  40 Units Subcutaneous Daily  . levETIRAcetam  500 mg Intravenous Q12H  . multivitamin  with minerals  1 tablet Oral Daily  . potassium chloride  20 mEq Oral Once  . thiamine  100 mg Oral Daily    Etta Quill PA-C [facility] [phone]  07/25/2014, 9:13 AM  Patient seen and examined.  Clinical course and management discussed.  Necessary edits performed.  I agree with the above.  Assessment and plan of care developed and discussed below.    Assessment/Plan: 49 YO male with new onset seizure and multiple mass lesions on MRI of unknown etiology.  No further seizures noted but patient is experiencing anxiety and incontinence which may very well be related to the location of edema and lesion.  Blood sugars now better controlled.  Neurosurgery has seen the patient with formal consult pending.    Recommendations: 1.  Dexamethasone 4mg  q 8hours 2.  Continue Keppra at current dose    Alexis Goodell, MD [facility] [phone]  07/25/2014  11:43 AM

## 2014-07-25 NOTE — Progress Notes (Signed)
Ativan 0.5 mg iv given per order Dr Ashok Cordia.1.5 mg wasted in sink, witnessed by Babs Bertin, RN

## 2014-07-25 NOTE — Progress Notes (Signed)
PULMONARY / CRITICAL CARE MEDICINE  Name: [name] MRN: [id number] DOB: 1965-02-14    ADMISSION DATE:  07/21/2014  PRIMARY SERVICE: PCCM  CHIEF COMPLAINT:  Headache, seizure  BRIEF PATIENT DESCRIPTION: 49 y/o man with hx of rectal CA s/p resection who developed seizure at home. Found to have calcified intracranial mass  MAJOR EVENTS/TEST RESULTS: 7/09 Head CT: Diffusely calcified 4.1 x 2.2 cm mass at the high right frontoparietal region 7/09 MRI brain: Large densely calcified right frontal lobe mass with surrounding edema. The mass shows peripheral enhancement. It is difficult determine if this is arising from the inferior falx or from the brain however I would favor this is intra-axial. Given history of rectal cancer, this may be heavily calcified metastatic deposits particularly given the history of recent onset of seizure and surrounding edema. Meningioma is a possibility however the calcific pattern is unusual for meningioma. 3 mm enhancing nodule left insula, suspicious for metastatic disease. 8 x 10 mm enhancing nodule anterior falx associated with ossification of the falx. Possible metastatic disease versus enhancing bone marrow in the falx. Bifrontal encephalomalacia consistent with prior head trauma  INDWELLING DEVICES:: ETT 7/09 >> 7/10  MICRO DATA: Urine 7/09 >> 5,00 colonies/ml (insignificant growth) Blood 7/09 >> NGTD  ANTIMICROBIALS:  Vanc 7/09 >> 7/09 Pip-tazo 7/09 >> 7/09  SUBJECTIVE:  Patient seems to have some significant anxiety at baseline. Spiked fevers overnight. Responded to tylenol, but returned. Patient c/o some chills. Unknown cause at this time.  VITAL SIGNS: Temp:  [97.8 F (36.6 C)-101.6 F (38.7 C)] 97.8 F (36.6 C) (07/12 0819) Pulse Rate:  [67-122] 115 (07/12 0930) Resp:  [11-45] 31 (07/12 0930) BP: (125-202)/(53-156) 171/105 mmHg (07/12 0930) SpO2:  [93 %-100 %] 93 % (07/12 0930) HEMODYNAMICS:   VENTILATOR SETTINGS:   INTAKE /  OUTPUT: Intake/Output      07/11 0701 - 07/12 0700 07/12 0701 - 07/13 0700   P.O. 620    I.V. (mL/kg) 568.5 (6.2)    IV Piggyback 410    Total Intake(mL/kg) 1598.5 (17.5)    Urine (mL/kg/hr) 1000 (0.5) 600 (2)   Stool 0 (0)    Total Output 1000 600   Net +598.5 -600        Urine Occurrence 4 x 1 x   Stool Occurrence 1 x      PHYSICAL EXAMINATION: General:  RASS 0, NAD, anxious Neuro: No focal deficits, speech improved HEENT: NCAT, PERRL, EOMI Cardiovascular:  Sinus tachycardia, no murmur Lungs: CTAB, norm WOB Abdomen:  Soft, nontender, +BS Ext: no edema, warm   LABS:  CBC  Recent Labs Lab 07/22/14 0545 07/23/14 0332 07/25/14 0208  WBC 5.9 5.8 6.3  HGB 12.9* 12.4* 12.4*  HCT 37.0* 36.1* 35.7*  PLT 180 185 155   Coag's No results for input(s): APTT, INR in the last 168 hours. BMET  Recent Labs Lab 07/23/14 0332 07/24/14 0238 07/25/14 0208  NA 145 143 141  K 3.8 5.0 3.3*  CL 116* 117* 111  CO2 19* 17* 17*  BUN 10 9 9   CREATININE 1.58* 1.52* 1.43*  GLUCOSE 246* 178* 219*   Electrolytes  Recent Labs Lab 07/23/14 0332 07/24/14 0238 07/25/14 0208  CALCIUM 8.8* 8.7* 8.8*  PHOS  --   --  4.1   Sepsis Markers  Recent Labs Lab 07/22/14 0222 07/22/14 0322 07/22/14 0545 07/23/14 0332  LATICACIDVEN 2.45*  --  3.0*  --   PROCALCITON  --  0.18  --  0.13   ABG  Recent Labs Lab 07/22/14 0438  PHART 7.291*  PCO2ART 41.3  PO2ART 262.0*   Liver Enzymes  Recent Labs Lab 07/21/14 2130 07/25/14 0208  AST 32  --   ALT 44  --   ALKPHOS 151*  --   BILITOT 0.7  --   ALBUMIN 4.1 3.1*   Cardiac Enzymes No results for input(s): TROPONINI, PROBNP in the last 168 hours. Glucose  Recent Labs Lab 07/24/14 0803 07/24/14 1140 07/24/14 1643 07/24/14 2034 07/24/14 2317 07/25/14 0825  GLUCAP 162* 150* 159* 214* 230* 229*    CXR: Low volumes, NAD   ASSESSMENT / PLAN: NEUROLOGIC A: New onset seizures  - had been seizure free for  ~66yrs Post ictal state - resolved Multpile brain lesions - concern for mets Anxiety P:   RASS goal: 0 Keppra PRN lorazepam for seizure Neurology following - recommended NS consult for possible brain bx NS consultation called 7/10 - Dr Christella Noa to see High dose steroids per Neurology.  PULMONARY A: Acute respiratory failure - intubated for AMS P:   Extubated 7/10 Now on RA CXR (fever) - no acute changes  CARDIOVASCULAR A: HTN Tachy P:   Amlodipine increased from 2.5mg  to home dose of 10mg  (has close monitoring) Hydralazine 10mg  Q4h PRN Starting Clonidine 0.1mg  TID  RENAL A: AKI P:   Monitor BMP Monitor I/Os Correct electrolytes as indicated DC foley IVSL  GASTROINTESTINAL A: No active issues P:   Advance diet as tolerated - carb mod  - NPO for angiogram per neuro; restart diet after complete Zofran PRN nausea  HEMATOLOGIC A: No issues P:   DVT px: SCDs (SQ heparin DC'd 7/10) Monitor CBC intermittently Transfuse per usual guidelines  INFECTIOUS A: Fever; unknown source  - Only peripheral lines in place; Cx neg to date; consider noninfectious source (seizure, drug, withdrawal) No signs of infection P:   CXR - clear consider repeat blood cx and abx WBC wnl Monitor temp  ENDOCRINE A: HONK - resolved DM 2 P:   DC Lantus Start Levemir 25u BID SSI - resistant   - 4u novolog TIB w/ meals    Elberta Leatherwood, MD,MS,  PGY2 07/25/2014 10:18 AM

## 2014-07-25 NOTE — Progress Notes (Signed)
Pt RR 20-40's shallow breathing at rest. Informed Dr. Ashby Dawes advised to monitor.

## 2014-07-25 NOTE — Progress Notes (Signed)
[facility] ADULT ICU REPLACEMENT PROTOCOL FOR AM LAB REPLACEMENT ONLY  The patient does apply for the [facility] Adult ICU Electrolyte Replacment Protocol based on the criteria listed below:   1. Is GFR >/= 40 ml/min? Yes.    Patient's GFR today is >60 2. Is urine output >/= 0.5 ml/kg/hr for the last 6 hours? Yes.   Patient's UOP is 0.5 ml/kg/hr 3. Is BUN < 60 mg/dL? Yes.    Patient's BUN today is 9 4. Abnormal electrolyte(s): K+3.3 5. Ordered repletion with: none 6. If a panic level lab has been reported, has the CCM MD in charge been notified? No..   Physician:  Oneita Jolly, Eddie Dibbles Hilliard 07/25/2014 3:30 AM

## 2014-07-25 NOTE — Consult Note (Signed)
Reason for Consult:brain lesions Referring Physician: simonds, [name] is an 49 y.o. male.  HPI: whom was admitted for new onset seizure. Head CT revealed a calcified lesion and other lesions.Given his history of rectal cancer I was asked to evaluate the imaging.   Past Medical History  Diagnosis Date  . Diabetes mellitus without complication   . Hypertension     Past Surgical History  Procedure Laterality Date  . Joint replacement Left 2004    Left hip    Family History  Problem Relation Age of Onset  . Hypertension Mother   . Heart disease Mother   . Hypertension Sister   . Hypertension Brother     Social History:  reports that he has never smoked. He does not have any smokeless tobacco history on file. He reports that he drinks about 1.8 oz of alcohol per week. He reports that he does not use illicit drugs.  Allergies: No Known Allergies  Medications: I have reviewed the patient's current medications.  Results for orders placed or performed during the hospital encounter of 07/21/14 (from the past 48 hour(s))  Glucose, capillary     Status: Abnormal   Collection Time: 07/23/14  4:31 PM  Result Value Ref Range   Glucose-Capillary 293 (H) 65 - 99 mg/dL  Glucose, capillary     Status: Abnormal   Collection Time: 07/23/14  7:52 PM  Result Value Ref Range   Glucose-Capillary 245 (H) 65 - 99 mg/dL   Comment 1 Notify RN   Glucose, capillary     Status: Abnormal   Collection Time: 07/23/14 11:39 PM  Result Value Ref Range   Glucose-Capillary 174 (H) 65 - 99 mg/dL   Comment 1 Notify RN   Basic metabolic panel     Status: Abnormal   Collection Time: 07/24/14  2:38 AM  Result Value Ref Range   Sodium 143 135 - 145 mmol/L   Potassium 5.0 3.5 - 5.1 mmol/L    Comment: DELTA CHECK NOTED NO VISIBLE HEMOLYSIS    Chloride 117 (H) 101 - 111 mmol/L   CO2 17 (L) 22 - 32 mmol/L   Glucose, Bld 178 (H) 65 - 99 mg/dL   BUN 9 6 - 20 mg/dL   Creatinine, Ser 1.52 (H)  0.61 - 1.24 mg/dL   Calcium 8.7 (L) 8.9 - 10.3 mg/dL   GFR calc non Af Amer 53 (L) >60 mL/min   GFR calc Af Amer >60 >60 mL/min    Comment: (NOTE) The eGFR has been calculated using the CKD EPI equation. This calculation has not been validated in all clinical situations. eGFR's persistently <60 mL/min signify possible Chronic Kidney Disease.    Anion gap 9 5 - 15  Glucose, capillary     Status: Abnormal   Collection Time: 07/24/14  3:40 AM  Result Value Ref Range   Glucose-Capillary 166 (H) 65 - 99 mg/dL   Comment 1 Notify RN   Glucose, capillary     Status: Abnormal   Collection Time: 07/24/14  8:03 AM  Result Value Ref Range   Glucose-Capillary 162 (H) 65 - 99 mg/dL  Glucose, capillary     Status: Abnormal   Collection Time: 07/24/14 11:40 AM  Result Value Ref Range   Glucose-Capillary 150 (H) 65 - 99 mg/dL  Glucose, capillary     Status: Abnormal   Collection Time: 07/24/14  4:43 PM  Result Value Ref Range   Glucose-Capillary 159 (H) 65 - 99 mg/dL  Comment 1 Notify RN   Glucose, capillary     Status: Abnormal   Collection Time: 07/24/14  8:34 PM  Result Value Ref Range   Glucose-Capillary 214 (H) 65 - 99 mg/dL  Glucose, capillary     Status: Abnormal   Collection Time: 07/24/14 11:17 PM  Result Value Ref Range   Glucose-Capillary 230 (H) 65 - 99 mg/dL  CBC with Differential/Platelet     Status: Abnormal   Collection Time: 07/25/14  2:08 AM  Result Value Ref Range   WBC 6.3 4.0 - 10.5 K/uL   RBC 4.65 4.22 - 5.81 MIL/uL   Hemoglobin 12.4 (L) 13.0 - 17.0 g/dL   HCT 35.7 (L) 39.0 - 52.0 %   MCV 76.8 (L) 78.0 - 100.0 fL   MCH 26.7 26.0 - 34.0 pg   MCHC 34.7 30.0 - 36.0 g/dL   RDW 14.0 11.5 - 15.5 %   Platelets 155 150 - 400 K/uL   Neutrophils Relative % 78 (H) 43 - 77 %   Neutro Abs 5.0 1.7 - 7.7 K/uL   Lymphocytes Relative 14 12 - 46 %   Lymphs Abs 0.9 0.7 - 4.0 K/uL   Monocytes Relative 7 3 - 12 %   Monocytes Absolute 0.4 0.1 - 1.0 K/uL   Eosinophils Relative 0  0 - 5 %   Eosinophils Absolute 0.0 0.0 - 0.7 K/uL   Basophils Relative 0 0 - 1 %   Basophils Absolute 0.0 0.0 - 0.1 K/uL  Renal function panel     Status: Abnormal   Collection Time: 07/25/14  2:08 AM  Result Value Ref Range   Sodium 141 135 - 145 mmol/L   Potassium 3.3 (L) 3.5 - 5.1 mmol/L    Comment: DELTA CHECK NOTED   Chloride 111 101 - 111 mmol/L   CO2 17 (L) 22 - 32 mmol/L   Glucose, Bld 219 (H) 65 - 99 mg/dL   BUN 9 6 - 20 mg/dL   Creatinine, Ser 1.43 (H) 0.61 - 1.24 mg/dL   Calcium 8.8 (L) 8.9 - 10.3 mg/dL   Phosphorus 4.1 2.5 - 4.6 mg/dL   Albumin 3.1 (L) 3.5 - 5.0 g/dL   GFR calc non Af Amer 57 (L) >60 mL/min   GFR calc Af Amer >60 >60 mL/min    Comment: (NOTE) The eGFR has been calculated using the CKD EPI equation. This calculation has not been validated in all clinical situations. eGFR's persistently <60 mL/min signify possible Chronic Kidney Disease.    Anion gap 13 5 - 15  Glucose, capillary     Status: Abnormal   Collection Time: 07/25/14  8:25 AM  Result Value Ref Range   Glucose-Capillary 229 (H) 65 - 99 mg/dL  Glucose, capillary     Status: Abnormal   Collection Time: 07/25/14 12:41 PM  Result Value Ref Range   Glucose-Capillary 223 (H) 65 - 99 mg/dL    Dg Chest Port 1 View  07/25/2014   CLINICAL DATA:  Fever, history of rectal malignancy, brain mass, and acute respiratory failure.  EXAM: PORTABLE CHEST - 1 VIEW  COMPARISON:  Portable chest x-ray of July 23, 2014  FINDINGS: The esophagus and trachea have been extubated. The lungs are well-expanded. There are mildly prominent interstitial markings at the left lung base. There is no significant pleural effusion. The cardiac silhouette is enlarged. The pulmonary vascularity is not engorged. The bony thorax exhibits no acute abnormality.  IMPRESSION: 1. The lungs are well-expanded. There is left  lower lobe atelectasis or pneumonia. 2. There is cardiomegaly without pulmonary edema.   Electronically Signed   By:  David  Martinique M.D.   On: 07/25/2014 09:09    Review of Systems  Constitutional: Negative.   HENT: Negative.   Eyes: Negative.   Respiratory: Negative.   Cardiovascular: Negative.   Gastrointestinal: Negative.   Genitourinary: Negative.   Musculoskeletal: Negative.   Skin: Negative.   Neurological: Positive for seizures.  Endo/Heme/Allergies: Negative.   Psychiatric/Behavioral: Negative.    Blood pressure 141/71, pulse 123, temperature 99.1 F (37.3 C), temperature source Oral, resp. rate 27, height 5' 6"  (1.676 m), weight 91.5 kg (201 lb 11.5 oz), SpO2 91 %. Physical Exam  Assessment/Plan: [name] has a calcified mass with surrounding edema in the right frontal lobe. The mass is just atop the corpus callosum. There is a much smaller abnormality in the left insula which the radiologists believe is a metastasis, less than 68m in size. There is also another calcified mass/dural thickening along the falx anteriorly. It is not at all clear that any of these masses represent metastatic tumor. I will obtain an MRA to ensure the calcification is not an avm. This mass is also too calcified to attempt a stereotactic biopsy. Resection is a possibility but there is no pressing need for a craniotomy at this time. The insular lesion is simply too small to do anything. It is also not clear that this is tumor. I would recommend a repeat brain MRI in ~ 276monthto look for any change.  Most likely the midline calcification with surrounding edema is the cause of the new seizure, but again given its location, treatment with medication alone might be advantageous. The mass could  represent a calcified meningioma which could also explain the edema. If not an avm, then I still believe the best course of action is a repeat MRI. The most rostral of the lesions i believe is a simple dural calcification.  Concetta Guion L 07/25/2014, 2:35 PM

## 2014-07-26 LAB — GLUCOSE, CAPILLARY
GLUCOSE-CAPILLARY: 314 mg/dL — AB (ref 65–99)
GLUCOSE-CAPILLARY: 328 mg/dL — AB (ref 65–99)
Glucose-Capillary: 294 mg/dL — ABNORMAL HIGH (ref 65–99)
Glucose-Capillary: 367 mg/dL — ABNORMAL HIGH (ref 65–99)

## 2014-07-26 LAB — RENAL FUNCTION PANEL
ALBUMIN: 3.1 g/dL — AB (ref 3.5–5.0)
Anion gap: 15 (ref 5–15)
BUN: 14 mg/dL (ref 6–20)
CO2: 16 mmol/L — ABNORMAL LOW (ref 22–32)
Calcium: 9.3 mg/dL (ref 8.9–10.3)
Chloride: 108 mmol/L (ref 101–111)
Creatinine, Ser: 1.38 mg/dL — ABNORMAL HIGH (ref 0.61–1.24)
GFR calc Af Amer: >60 mL/min (ref >60)
GFR calc non Af Amer: 59 mL/min — ABNORMAL LOW (ref >60)
Glucose, Bld: 297 mg/dL — ABNORMAL HIGH (ref 65–99)
PHOSPHORUS: 4.8 mg/dL — AB (ref 2.5–4.6)
Potassium: 4.6 mmol/L (ref 3.5–5.1)
SODIUM: 139 mmol/L (ref 135–145)

## 2014-07-26 LAB — CULTURE, BLOOD (ROUTINE X 2)
Culture: NO GROWTH
Culture: NO GROWTH

## 2014-07-26 LAB — CBC WITH DIFFERENTIAL/PLATELET
BASOS PCT: 0 % (ref 0–1)
Basophils Absolute: 0 10*3/uL (ref 0.0–0.1)
Eosinophils Absolute: 0 10*3/uL (ref 0.0–0.7)
Eosinophils Relative: 0 % (ref 0–5)
HCT: 37.7 % — ABNORMAL LOW (ref 39.0–52.0)
Hemoglobin: 13.2 g/dL (ref 13.0–17.0)
Lymphocytes Relative: 7 % — ABNORMAL LOW (ref 12–46)
Lymphs Abs: 0.6 10*3/uL — ABNORMAL LOW (ref 0.7–4.0)
MCH: 26.9 pg (ref 26.0–34.0)
MCHC: 35 g/dL (ref 30.0–36.0)
MCV: 76.9 fL — AB (ref 78.0–100.0)
MONOS PCT: 3 % (ref 3–12)
Monocytes Absolute: 0.3 10*3/uL (ref 0.1–1.0)
Neutro Abs: 7.6 10*3/uL (ref 1.7–7.7)
Neutrophils Relative %: 90 % — ABNORMAL HIGH (ref 43–77)
Platelets: 189 10*3/uL (ref 150–400)
RBC: 4.9 MIL/uL (ref 4.22–5.81)
RDW: 14.1 % (ref 11.5–15.5)
WBC: 8.4 10*3/uL (ref 4.0–10.5)

## 2014-07-26 MED ORDER — INSULIN DETEMIR 100 UNIT/ML ~~LOC~~ SOLN
30.0000 [IU] | Freq: Two times a day (BID) | SUBCUTANEOUS | Status: DC
  Administered 2014-07-26 – 2014-07-27 (×3): 30 [IU] via SUBCUTANEOUS
  Filled 2014-07-26 (×5): qty 0.3

## 2014-07-26 MED ORDER — LEVETIRACETAM 500 MG PO TABS
500.0000 mg | ORAL_TABLET | Freq: Two times a day (BID) | ORAL | Status: DC
  Administered 2014-07-26 – 2014-07-27 (×3): 500 mg via ORAL
  Filled 2014-07-26 (×4): qty 1

## 2014-07-26 MED ORDER — INSULIN ASPART 100 UNIT/ML ~~LOC~~ SOLN
0.0000 [IU] | Freq: Three times a day (TID) | SUBCUTANEOUS | Status: DC
  Administered 2014-07-26: 11 [IU] via SUBCUTANEOUS
  Administered 2014-07-27: 15 [IU] via SUBCUTANEOUS
  Administered 2014-07-27: 8 [IU] via SUBCUTANEOUS

## 2014-07-26 NOTE — Progress Notes (Signed)
Subjective: No further seizures. Currently has foley due to incontinence.  Tolerating steroids and Keppra.  .   Objective: Current vital signs: BP 125/72 mmHg  Pulse 106  Temp(Src) 97.4 F (36.3 C) (Oral)  Resp 55  Ht 5\' 6"  (1.676 m)  Wt 91.5 kg (201 lb 11.5 oz)  BMI 32.57 kg/m2  SpO2 91% Vital signs in last 24 hours: Temp:  [97.4 F (36.3 C)-99.1 F (37.3 C)] 97.4 F (36.3 C) (07/13 0837) Pulse Rate:  [64-128] 106 (07/13 0900) Resp:  [22-58] 55 (07/13 0700) BP: (125-214)/(64-170) 125/72 mmHg (07/13 0800) SpO2:  [89 %-98 %] 91 % (07/13 0900)  Intake/Output from previous day: 07/12 0701 - 07/13 0700 In: 1200 [P.O.:960; IV Piggyback:240] Out: 0923 [Urine:3475] Intake/Output this shift:   Nutritional status: Diet heart healthy/carb modified Room service appropriate?: Yes; Fluid consistency:: Thin  Neurologic Exam: General: Mental Status: Alert, oriented to ICU, hospital, month, but had the wrong day.  Speech fluent without evidence of aphasia.  Able to follow 3 step commands without difficulty. Cranial Nerves: II:  Visual fields grossly normal, pupils equal, round, reactive to light and accommodation III,IV, VI: ptosis not present, extra-ocular motions intact bilaterally V,VII: smile symmetric, facial light touch sensation normal bilaterally VIII: hearing normal bilaterally IX,X: uvula rises symmetrically XI: bilateral shoulder shrug XII: midline tongue extension without atrophy or fasciculations  Motor: Right : Upper extremity   5/5    Left:     Upper extremity   5/5  Lower extremity   5/5     Lower extremity   5/5 Tone and bulk:normal tone throughout; no atrophy noted Sensory: Pinprick and light touch intact throughout, bilaterally Deep Tendon Reflexes:  1+ throughout with no AJ  Plantars: Right: downgoing   Left: downgoing Cerebellar: normal finger-to-nose,  normal heel-to-shin test   Lab Results: Basic Metabolic Panel:  Recent Labs Lab 07/23/14 0332  07/24/14 0238 07/25/14 0208 07/25/14 1455 07/26/14 0227  NA 145 143 141 139 139  K 3.8 5.0 3.3* 3.7 4.6  CL 116* 117* 111 109 108  CO2 19* 17* 17* 16* 16*  GLUCOSE 246* 178* 219* 216* 297*  BUN 10 9 9 8 14   CREATININE 1.58* 1.52* 1.43* 1.29* 1.38*  CALCIUM 8.8* 8.7* 8.8* 9.1 9.3  PHOS  --   --  4.1  --  4.8*    Liver Function Tests:  Recent Labs Lab 07/21/14 2130 07/25/14 0208 07/26/14 0227  AST 32  --   --   ALT 44  --   --   ALKPHOS 151*  --   --   BILITOT 0.7  --   --   PROT 7.4  --   --   ALBUMIN 4.1 3.1* 3.1*   No results for input(s): LIPASE, AMYLASE in the last 168 hours. No results for input(s): AMMONIA in the last 168 hours.  CBC:  Recent Labs Lab 07/21/14 2130 07/22/14 0322 07/22/14 0545 07/23/14 0332 07/25/14 0208 07/26/14 0227  WBC 4.4 6.2 5.9 5.8 6.3 8.4  NEUTROABS 2.9  --   --   --  5.0 7.6  HGB 14.0 13.5 12.9* 12.4* 12.4* 13.2  HCT 40.2 39.0 37.0* 36.1* 35.7* 37.7*  MCV 78.5 76.9* 77.1* 79.3 76.8* 76.9*  PLT 193 177 180 185 155 189    Cardiac Enzymes: No results for input(s): CKTOTAL, CKMB, CKMBINDEX, TROPONINI in the last 168 hours.  Lipid Panel: No results for input(s): CHOL, TRIG, HDL, CHOLHDL, VLDL, LDLCALC in the last 168 hours.  CBG:  Recent Labs Lab 07/25/14 0825 07/25/14 1241 07/25/14 1653 07/25/14 2244 07/26/14 0821  GLUCAP 229* 223* 194* 266* 294*    Microbiology: Results for orders placed or performed during the hospital encounter of 07/21/14  Blood Culture (routine x 2)     Status: None (Preliminary result)   Collection Time: 07/21/14  9:19 PM  Result Value Ref Range Status   Specimen Description BLOOD RIGHT ARM  Final   Special Requests BOTTLES DRAWN AEROBIC ONLY 5CC  Final   Culture NO GROWTH 4 DAYS  Final   Report Status PENDING  Incomplete  Blood Culture (routine x 2)     Status: None (Preliminary result)   Collection Time: 07/21/14  9:26 PM  Result Value Ref Range Status   Specimen Description BLOOD RIGHT  FOREARM  Final   Special Requests BOTTLES DRAWN AEROBIC ONLY 5CC  Final   Culture NO GROWTH 4 DAYS  Final   Report Status PENDING  Incomplete  Urine culture     Status: None   Collection Time: 07/22/14 12:54 AM  Result Value Ref Range Status   Specimen Description URINE, CLEAN CATCH  Final   Special Requests NONE  Final   Culture 5,000 COLONIES/mL INSIGNIFICANT GROWTH  Final   Report Status 07/23/2014 FINAL  Final  MRSA PCR Screening     Status: None   Collection Time: 07/22/14  4:10 AM  Result Value Ref Range Status   MRSA by PCR NEGATIVE NEGATIVE Final    Comment:        The GeneXpert MRSA Assay (FDA approved for NASAL specimens only), is one component of a comprehensive MRSA colonization surveillance program. It is not intended to diagnose MRSA infection nor to guide or monitor treatment for MRSA infections.     Coagulation Studies: No results for input(s): LABPROT, INR in the last 72 hours.  Imaging: Mr Virgel Paling BO Contrast  07/25/2014   CLINICAL DATA:  Seizure. Right frontal calcified mass. AVM. Rectal carcinoma.  EXAM: MRA HEAD WITHOUT CONTRAST  TECHNIQUE: Angiographic images of the Circle of Willis were obtained using MRA technique without intravenous contrast.  COMPARISON:  MRI head 07/22/2014 and CT head 07/21/2014  FINDINGS: Negative for a vascular malformation. The calcified mass is not show abnormal vascularity. Negative for AVN. No large feeding vessels.  Internal carotid artery patent bilaterally without stenosis. Hypoplastic left A1 segment. Both anterior cerebral arteries are patent and supplied from the right. Posterior cerebral arteries are patent bilaterally  Internal carotid artery patent bilaterally without stenosis. Basilar widely patent. Superior cerebellar and posterior cerebral arteries are patent.  IMPRESSION: Negative MRA of the head.  No evidence of AVM.   Electronically Signed   By: Franchot Gallo M.D.   On: 07/25/2014 19:06   Dg Chest Port 1  View  07/25/2014   CLINICAL DATA:  49 year old male with shortness of breath  EXAM: PORTABLE CHEST - 1 VIEW  COMPARISON:  Chest radiograph dated 07/25/2014  FINDINGS: Single-view of the chest demonstrate clear lungs. No focal consolidation, pleural effusion, or pneumothorax. Stable cardiomegaly. The osseous structures are grossly unremarkable.  IMPRESSION: No interval change.   Electronically Signed   By: Anner Crete M.D.   On: 07/25/2014 18:10   Dg Chest Port 1 View  07/25/2014   CLINICAL DATA:  Fever, history of rectal malignancy, brain mass, and acute respiratory failure.  EXAM: PORTABLE CHEST - 1 VIEW  COMPARISON:  Portable chest x-ray of July 23, 2014  FINDINGS: The esophagus and trachea have been  extubated. The lungs are well-expanded. There are mildly prominent interstitial markings at the left lung base. There is no significant pleural effusion. The cardiac silhouette is enlarged. The pulmonary vascularity is not engorged. The bony thorax exhibits no acute abnormality.  IMPRESSION: 1. The lungs are well-expanded. There is left lower lobe atelectasis or pneumonia. 2. There is cardiomegaly without pulmonary edema.   Electronically Signed   By: David  Martinique M.D.   On: 07/25/2014 09:09    Medications:  Scheduled: . amLODipine  10 mg Oral Daily  . cloNIDine  0.1 mg Oral TID  . dexamethasone  4 mg Oral 3 times per day  . folic acid  1 mg Oral Daily  . insulin aspart  0-20 Units Subcutaneous TID WC  . insulin aspart  4 Units Subcutaneous TID WC  . insulin detemir  25 Units Subcutaneous BID  . levETIRAcetam  500 mg Intravenous Q12H  . multivitamin with minerals  1 tablet Oral Daily  . pneumococcal 23 valent vaccine  0.5 mL Intramuscular Tomorrow-1000  . thiamine  100 mg Oral Daily   Etta Quill PA-C [facility] [phone]  07/26/2014, 9:16 AM  Assessment/Plan: Patient without further seizures on Keppra.  On Dexamethasone for edema.  It is hopeful that the Dexamethasone  will help with the incontinence.  Neurosurgery has evaluated patient and recommends follow up imaging.  Recommendations: 1.  Continue Keppra at current dose 2.  Continue Dexamethasone 3.  Would discontinue foley and follow continence  Patient seen and examined.  Clinical course and management discussed.  Necessary edits performed.  I agree with the above.  Assessment and plan of care developed and discussed below.    Alexis Goodell, MD [facility] [phone]  07/26/2014  11:43 AM

## 2014-07-26 NOTE — Progress Notes (Signed)
PULMONARY / CRITICAL CARE MEDICINE  Name: [name] MRN: [id number] DOB: September 13, 1965    ADMISSION DATE:  07/21/2014  PRIMARY SERVICE: PCCM  CHIEF COMPLAINT:  Headache, seizure  BRIEF PATIENT DESCRIPTION: 49 y/o man with hx of rectal CA s/p resection who developed seizure at home. Found to have calcified intracranial mass  MAJOR EVENTS/TEST RESULTS: 7/09 Head CT: Diffusely calcified 4.1 x 2.2 cm mass at the high right frontoparietal region 7/09 MRI brain: Large densely calcified right frontal lobe mass with surrounding edema.  7/12 MRA Brain: neg for AVM  INDWELLING DEVICES:: ETT 7/09 >> 7/10  MICRO DATA: Urine 7/09 >> 5,00 colonies/ml (insignificant growth) Blood 7/09 >> NGTD  ANTIMICROBIALS:  Vanc 7/09 >> 7/09 Pip-tazo 7/09 >> 7/09  SUBJECTIVE:  Patient has been improving. Had some tachycardia and tachypnea yesterday and into the night. This has mostly resolved.  VITAL SIGNS: Temp:  [97.4 F (36.3 C)-99.1 F (37.3 C)] 97.4 F (36.3 C) (07/13 0837) Pulse Rate:  [64-128] 106 (07/13 0900) Resp:  [22-58] 55 (07/13 0700) BP: (125-214)/(64-170) 125/72 mmHg (07/13 0800) SpO2:  [89 %-98 %] 95 % (07/13 0900) HEMODYNAMICS:   VENTILATOR SETTINGS:   INTAKE / OUTPUT: Intake/Output      07/12 0701 - 07/13 0700 07/13 0701 - 07/14 0700   P.O. 960    I.V. (mL/kg)     IV Piggyback 240    Total Intake(mL/kg) 1200 (13.1)    Urine (mL/kg/hr) 3475 (1.6)    Stool     Total Output 3475     Net -2275          Urine Occurrence 2 x      PHYSICAL EXAMINATION: General:  RASS 0, NAD, talkative, pleasant Neuro: No focal deficits, speech clear HEENT: NCAT, PERRL, EOMI Cardiovascular:  RRR, no murmur Lungs: CTAB, norm WOB Abdomen:  Soft, nontender, +BS Ext: no edema, warm   LABS:  CBC  Recent Labs Lab 07/23/14 0332 07/25/14 0208 07/26/14 0227  WBC 5.8 6.3 8.4  HGB 12.4* 12.4* 13.2  HCT 36.1* 35.7* 37.7*  PLT 185 155 189   Coag's No results for input(s): APTT,  INR in the last 168 hours. BMET  Recent Labs Lab 07/25/14 0208 07/25/14 1455 07/26/14 0227  NA 141 139 139  K 3.3* 3.7 4.6  CL 111 109 108  CO2 17* 16* 16*  BUN 9 8 14   CREATININE 1.43* 1.29* 1.38*  GLUCOSE 219* 216* 297*   Electrolytes  Recent Labs Lab 07/25/14 0208 07/25/14 1455 07/26/14 0227  CALCIUM 8.8* 9.1 9.3  PHOS 4.1  --  4.8*   Sepsis Markers  Recent Labs Lab 07/22/14 0222 07/22/14 0322 07/22/14 0545 07/23/14 0332  LATICACIDVEN 2.45*  --  3.0*  --   PROCALCITON  --  0.18  --  0.13   ABG  Recent Labs Lab 07/22/14 0438 07/25/14 1746  PHART 7.291* 7.430  PCO2ART 41.3 24.4*  PO2ART 262.0* 66.0*   Liver Enzymes  Recent Labs Lab 07/21/14 2130 07/25/14 0208 07/26/14 0227  AST 32  --   --   ALT 44  --   --   ALKPHOS 151*  --   --   BILITOT 0.7  --   --   ALBUMIN 4.1 3.1* 3.1*   Cardiac Enzymes No results for input(s): TROPONINI, PROBNP in the last 168 hours. Glucose  Recent Labs Lab 07/24/14 2317 07/25/14 0825 07/25/14 1241 07/25/14 1653 07/25/14 2244 07/26/14 0821  GLUCAP 230* 229* 223* 194* 266* 294*  CXR: Low volumes, NAD   ASSESSMENT / PLAN: NEUROLOGIC A: New onset seizures  - had been seizure free for ~73yrs Post ictal state - resolved Multpile brain lesions - concern for mets Anxiety P:   RASS goal: 0 Keppra; switch to PO PRN lorazepam for seizures MRA - neg for AVM Neurology following - Keppra, and decadron NS consultation called 7/10 - Dr Christella Noa; no surg or bx at this time, repeat MRI in 48mths for any change  PULMONARY A: Acute respiratory failure - intubated for AMS P:   Extubated 7/10 Now on RA CXR - no acute changes  CARDIOVASCULAR A: HTN - improved Tachy - improved P:   Amlodipine 10mg   Hydralazine 10mg  Q4h PRN Clonidine 0.1mg  TID   RENAL A: AKI P:   Monitor BMP Monitor I/Os Correct electrolytes as indicated DC foley IVSL  GASTROINTESTINAL A: No active issues P:   Advance diet  as tolerated - carb mod Zofran PRN nausea  HEMATOLOGIC A: No issues P:   DVT px: SCDs (SQ heparin DC'd 7/10) Monitor CBC intermittently Transfuse per usual guidelines  INFECTIOUS A: Fever; unknown source (afebrile since 4am 7/12)  - consider noninfectious source (seizure, drug, withdrawal) No signs of infection P:   WBC wnl Monitor temp  ENDOCRINE A: HONK - resolved DM 2 P:   DC Lantus Levemir 30u BID SSI - resistant   - 4u novolog TIB w/ meals  DISPO - Stable to transfer to floor today.   Elberta Leatherwood, MD,MS,  PGY2 07/26/2014 9:56 AM

## 2014-07-26 NOTE — Care Management Note (Signed)
Case Management Note  Patient Details  Name: ADRYEL WORTMANN MRN: [id number] Date of Birth: 1965-12-29  Subjective/Objective:   Patient in bed - laying sideways in bed, feet off one side, head off the other, in constant movement,  very friendly.  Introduced to his wife, [name] in room with him.  Wife in awe at patients behavior, states this is so unlike him, completely different.  States prior to admission patient independent.                   Action/Plan:   Expected Discharge Date:                  Expected Discharge Plan:  [place]  In-House Referral:     Discharge planning Services     Post Acute Care Choice:    Choice offered to:     DME Arranged:    DME Agency:     HH Arranged:    [place] Agency:     Status of Service:  In process, will continue to follow  Medicare Important Message Given:    Date Medicare IM Given:    Medicare IM give by:    Date Additional Medicare IM Given:    Additional Medicare Important Message give by:     If discussed at [place] of Stay Meetings, dates discussed:    Additional Comments:  Vergie Living, RN 07/26/2014, 11:42 AM

## 2014-07-26 NOTE — Evaluation (Signed)
Physical Therapy Evaluation Patient Details Name: [name] MRN: [id number] DOB: Jan 08, 1966 Today's Date: 07/26/2014   History of Present Illness  49y.o. male admitted to [facility] on 07/21/14 for seizure.  Pt's MRI revealed calcified mass with surrounding edema in the right frontal lobe.  Pt with significant PMHx of HTN, DM, rectal CA.  Hospital course complicated by urinary incontinence.    Clinical Impression  Pt is moderately unsteady on his feet, staggering gait pattern, but aware and able to attempt to correct, but ends up using the railing or furniture to stabilized.  Encouraged cane use at home and continued ambulation with wife and staff's supervision while here in the hospital.  Recommend OP PT for balance and gait training.   PT to follow acutely for deficits listed below.       Follow Up Recommendations Outpatient PT;Other (comment) (outpatient balance therapy)    Equipment Recommendations  None recommended by PT    Recommendations for Other Services   NA    Precautions / Restrictions Precautions Precautions: Fall Precaution Comments: Educated pt that someone must be with him at all times while up on his feet in the hospital he reported understanding, yet when I stepped out of the room to get him a gown, I found him up out of the bed walking around the room.  Restrictions Weight Bearing Restrictions: No      Mobility  Bed Mobility Overal bed mobility: Independent                Transfers Overall transfer level: Needs assistance Equipment used: None Transfers: Sit to/from Stand Sit to Stand: Supervision         General transfer comment: supervision for safety reliance on one hand for balance and stability during transitions.   Ambulation/Gait Ambulation/Gait assistance: Min guard Ambulation Distance (Feet): 300 Feet Assistive device: 1 person hand held assist Gait Pattern/deviations: Step-through pattern;Staggering left;Staggering right;Ataxic Gait  velocity: decreased Gait velocity interpretation: Below normal speed for age/gender General Gait Details: staggering mildly ataxic gait pattern.  Pt is able to self correct, but often reaches for external objects for stability.   Stairs Stairs: Yes Stairs assistance: Min guard Stair Management: One rail Right;Forwards;Alternating pattern Number of Stairs: 12 General stair comments: pt relies heavily on rails and had to stop once to recover balance LOB posteriorly         Balance Overall balance assessment: Needs assistance Sitting-balance support: Feet supported;Bilateral upper extremity supported Sitting balance-Leahy Scale: Fair     Standing balance support: Bilateral upper extremity supported;No upper extremity supported;Single extremity supported Standing balance-Leahy Scale: Good                               Pertinent Vitals/Pain Pain Assessment: No/denies pain    Home Living Family/patient expects to be discharged to:: Private residence Living Arrangements: Spouse/significant other;Children Available Help at Discharge: Family Type of Home: House Home Access: Stairs to enter Entrance Stairs-Rails: None Entrance Stairs-Number of Steps: 2 Home Layout: One level Home Equipment: Environmental consultant - 2 wheels;Cane - single point Additional Comments: pt has equipment from prior L THA    Prior Function Level of Independence: Independent                  Extremity/Trunk Assessment   Upper Extremity Assessment: RUE deficits/detail;LUE deficits/detail RUE Deficits / Details: strength 5/5 but pt has difficulty with gown ties.       LUE Deficits /  Details: Strength 5/5   Lower Extremity Assessment: RLE deficits/detail;LLE deficits/detail RLE Deficits / Details: strength 4+/5, there is some give during resisted manual muscle testing and ankles seem weaker than knees and hips at 4/5 bil with dorsi flexion.  Heel to shin test WNL, however staggering almost ataxic  gait pattern.  LLE Deficits / Details: strength 4+/5, there is some give during resisted manual muscle testing and ankles seem weaker than knees and hips at 4/5 bil with dorsi flexion.  Heel to shin test WNL, however staggering almost ataxic gait pattern.   Cervical / Trunk Assessment: Normal  Communication   Communication: No difficulties  Cognition Arousal/Alertness: Awake/alert Behavior During Therapy: Impulsive Overall Cognitive Status: Within Functional Limits for tasks assessed                               Assessment/Plan    PT Assessment Patient needs continued PT services  PT Diagnosis Difficulty walking;Abnormality of gait;Generalized weakness   PT Problem List Decreased strength;Decreased balance;Decreased mobility;Decreased activity tolerance;Decreased coordination;Decreased knowledge of use of DME  PT Treatment Interventions DME instruction;Gait training;Stair training;Therapeutic activities;Functional mobility training;Therapeutic exercise;Balance training;Neuromuscular re-education;Patient/family education   PT Goals (Current goals can be found in the Care Plan section) Acute Rehab PT Goals Patient Stated Goal: to go home, get back to normal PT Goal Formulation: With patient Time For Goal Achievement: 08/09/14 Potential to Achieve Goals: Good    Frequency Min 3X/week    End of Session Equipment Utilized During Treatment: Gait belt Activity Tolerance: Patient tolerated treatment well Patient left: in bed;with call bell/phone within reach;with family/visitor present Nurse Communication: Mobility status         Time: 1287-8676 PT Time Calculation (min) (ACUTE ONLY): 33 min   Charges:   PT Evaluation $Initial PT Evaluation Tier I: 1 Procedure PT Treatments $Gait Training: 8-22 mins        [name], [name], DPT [phone]   07/26/2014, 5:26 PM

## 2014-07-26 NOTE — Progress Notes (Signed)
NURSING PROGRESS NOTE  [name] [id number] Transfer Data: 07/26/2014 3:00 PM Attending Provider: Raylene Miyamoto, MD PCP:No PCP Per Patient Code Status: FULL  [name] is a 49 y.o. male patient transferred from 24M  -No acute distress noted.  -No complaints of shortness of breath.  -No complaints of chest pain.   Cardiac Monitoring: Box #20  in place.   Blood pressure 163/83, pulse 110, temperature 97.9 F (36.6 C), temperature source Oral, resp. rate 24, height 5\' 6"  (1.676 m), weight 91.5 kg (201 lb 11.5 oz), SpO2 100 %.   IV Fluids:  IV in place,SL.  Allergies:  Review of patient's allergies indicates no known allergies.  Past Medical History:   has a past medical history of Diabetes mellitus without complication and Hypertension.  Past Surgical History:   has past surgical history that includes Joint replacement (Left, 2004).  Social History:   reports that he has never smoked. He does not have any smokeless tobacco history on file. He reports that he drinks about 1.8 oz of alcohol per week. He reports that he does not use illicit drugs.  Skin: Intact  Patient/Family orientated to room. Information packet given to patient/family. Admission inpatient armband information verified with patient/family to include name and date of birth and placed on patient arm. Side rails up x 2, fall assessment and education completed with patient/family. Patient/family able to verbalize understanding of risk associated with falls and verbalized understanding to call for assistance before getting out of bed. Call light within reach. Patient/family able to voice and demonstrate understanding of unit orientation instructions.    Will continue to evaluate and treat per MD orders.

## 2014-07-27 DIAGNOSIS — G939 Disorder of brain, unspecified: Secondary | ICD-10-CM

## 2014-07-27 DIAGNOSIS — R569 Unspecified convulsions: Secondary | ICD-10-CM

## 2014-07-27 LAB — RENAL FUNCTION PANEL
ALBUMIN: 3.2 g/dL — AB (ref 3.5–5.0)
ANION GAP: 11 (ref 5–15)
BUN: 28 mg/dL — ABNORMAL HIGH (ref 6–20)
CALCIUM: 9.3 mg/dL (ref 8.9–10.3)
CO2: 20 mmol/L — ABNORMAL LOW (ref 22–32)
CREATININE: 1.3 mg/dL — AB (ref 0.61–1.24)
Chloride: 107 mmol/L (ref 101–111)
GFR calc Af Amer: >60 mL/min (ref >60)
GFR calc non Af Amer: >60 mL/min (ref >60)
GLUCOSE: 250 mg/dL — AB (ref 65–99)
Phosphorus: 4.7 mg/dL — ABNORMAL HIGH (ref 2.5–4.6)
Potassium: 4.6 mmol/L (ref 3.5–5.1)
Sodium: 138 mmol/L (ref 135–145)

## 2014-07-27 LAB — CBC WITH DIFFERENTIAL/PLATELET
Basophils Absolute: 0 10*3/uL (ref 0.0–0.1)
Basophils Relative: 0 % (ref 0–1)
Eosinophils Absolute: 0 10*3/uL (ref 0.0–0.7)
Eosinophils Relative: 0 % (ref 0–5)
HCT: 37.6 % — ABNORMAL LOW (ref 39.0–52.0)
HEMOGLOBIN: 12.9 g/dL — AB (ref 13.0–17.0)
LYMPHS PCT: 9 % — AB (ref 12–46)
Lymphs Abs: 1 10*3/uL (ref 0.7–4.0)
MCH: 26.4 pg (ref 26.0–34.0)
MCHC: 34.3 g/dL (ref 30.0–36.0)
MCV: 77 fL — ABNORMAL LOW (ref 78.0–100.0)
MONOS PCT: 5 % (ref 3–12)
Monocytes Absolute: 0.6 10*3/uL (ref 0.1–1.0)
Neutro Abs: 10.1 10*3/uL — ABNORMAL HIGH (ref 1.7–7.7)
Neutrophils Relative %: 86 % — ABNORMAL HIGH (ref 43–77)
PLATELETS: 226 10*3/uL (ref 150–400)
RBC: 4.88 MIL/uL (ref 4.22–5.81)
RDW: 14.2 % (ref 11.5–15.5)
WBC: 11.7 10*3/uL — AB (ref 4.0–10.5)

## 2014-07-27 LAB — GLUCOSE, CAPILLARY
Glucose-Capillary: 190 mg/dL — ABNORMAL HIGH (ref 65–99)
Glucose-Capillary: 235 mg/dL — ABNORMAL HIGH (ref 65–99)
Glucose-Capillary: 272 mg/dL — ABNORMAL HIGH (ref 65–99)
Glucose-Capillary: 351 mg/dL — ABNORMAL HIGH (ref 65–99)

## 2014-07-27 MED ORDER — LIVING WELL WITH DIABETES BOOK
Freq: Once | Status: AC
  Administered 2014-07-27: 11:00:00
  Filled 2014-07-27: qty 1

## 2014-07-27 MED ORDER — LEVETIRACETAM 500 MG PO TABS: 500.0000 mg | ORAL_TABLET | Freq: Two times a day (BID) | ORAL | Status: DC

## 2014-07-27 MED ORDER — DEXAMETHASONE 4 MG PO TABS: 4.0000 mg | ORAL_TABLET | Freq: Three times a day (TID) | ORAL | Status: DC

## 2014-07-27 MED ORDER — CLONIDINE HCL 0.1 MG PO TABS: 0.1000 mg | ORAL_TABLET | Freq: Three times a day (TID) | ORAL | Status: DC

## 2014-07-27 MED ORDER — THIAMINE HCL 100 MG PO TABS: 100.0000 mg | ORAL_TABLET | Freq: Every day | ORAL | Status: DC

## 2014-07-27 MED ORDER — ADULT MULTIVITAMIN W/MINERALS CH: 1.0000 | ORAL_TABLET | Freq: Every day | ORAL | Status: DC

## 2014-07-27 MED ORDER — INSULIN STARTER KIT- PEN NEEDLES (ENGLISH)
1.0000 | Freq: Once | Status: AC
  Administered 2014-07-27: 1
  Filled 2014-07-27: qty 1

## 2014-07-27 MED ORDER — FOLIC ACID 1 MG PO TABS: 1.0000 mg | ORAL_TABLET | Freq: Every day | ORAL | Status: DC

## 2014-07-27 MED ORDER — INSULIN DETEMIR 100 UNIT/ML ~~LOC~~ SOLN
40.0000 [IU] | Freq: Two times a day (BID) | SUBCUTANEOUS | Status: DC
Start: 2014-07-27 — End: 2016-04-08

## 2014-07-27 NOTE — Progress Notes (Signed)
Nsg Discharge Note  Admit Date:  07/21/2014 Discharge date: 07/27/2014   [name] to be D/C'd Home per MD order.  AVS completed.  Copy for chart, and copy for patient signed, and dated. Patient/caregiver able to verbalize understanding.  Discharge Medication:   Medication List    STOP taking these medications        hydrochlorothiazide 25 MG tablet  Commonly known as:  HYDRODIURIL      TAKE these medications        amLODipine 10 MG tablet  Commonly known as:  NORVASC  Take 1 tablet (10 mg total) by mouth daily.     cloNIDine 0.1 MG tablet  Commonly known as:  CATAPRES  Take 1 tablet (0.1 mg total) by mouth 3 (three) times daily.     dexamethasone 4 MG tablet  Commonly known as:  DECADRON  Take 1 tablet (4 mg total) by mouth every 8 (eight) hours.     folic acid 1 MG tablet  Commonly known as:  FOLVITE  Take 1 tablet (1 mg total) by mouth daily.     insulin detemir 100 UNIT/ML injection  Commonly known as:  LEVEMIR  Inject 0.4 mLs (40 Units total) into the skin 2 (two) times daily.     levETIRAcetam 500 MG tablet  Commonly known as:  KEPPRA  Take 1 tablet (500 mg total) by mouth 2 (two) times daily.     multivitamin with minerals Tabs tablet  Take 1 tablet by mouth daily.     thiamine 100 MG tablet  Take 1 tablet (100 mg total) by mouth daily.        Discharge Assessment: Filed Vitals:   07/27/14 0806  BP: 146/95  Pulse: 85  Temp: 98.1 F (36.7 C)  Resp: 18   Skin clean, dry and intact without evidence of skin break down, no evidence of skin tears noted. IV catheter discontinued intact. Site without signs and symptoms of complications - no redness or edema noted at insertion site, patient denies c/o pain - only slight tenderness at site.  Dressing with slight pressure applied.  D/c Instructions-Education: Discharge instructions given to patient/family with verbalized understanding. D/c education completed with patient/family including follow up  instructions, medication list, d/c activities limitations if indicated, with other d/c instructions as indicated by MD - patient able to verbalize understanding, all questions fully answered. Patient was educated about insulin, able to demonstrate safe handling and injections.  Patient instructed to return to ED, call 911, or call MD for any changes in condition.  Patient escorted via [place], and D/C home via private auto.   Dayle Points, RN 07/27/2014 2:05 PM

## 2014-07-27 NOTE — Progress Notes (Signed)
Inpatient Diabetes Program Recommendations  AACE/ADA: New Consensus Statement on Inpatient Glycemic Control (2013)  Target Ranges:  Prepandial:   less than 140 mg/dL      Peak postprandial:   less than 180 mg/dL (1-2 hours)      Critically ill patients:  140 - 180 mg/dL   Reason for assessment:  Results for [name], [name] (MRN [id number]) as of 07/27/2014 13:02  Ref. Range 07/26/2014 21:10 07/27/2014 00:30 07/27/2014 04:02 07/27/2014 08:10 07/27/2014 11:50  Glucose-Capillary Latest Ref Range: 65-99 mg/dL 328 (H) 190 (H) 235 (H) 272 (H) 351 (H)  No A1C available.  He plans to follow-up at PCP office, [place].    Diabetes history: New Onset Diabetes according to patient  Note that patient admitted with blood sugar greater than 1000 mg/dL.  He states that the day he was admitted, he had gone and gotten a large slushy at [place] which he thinks likely contributed to high blood sugar on admit.  He had been told a year earlier at a health fair that his blood sugar was elevated, however he never went to physician to have evaluated.  Briefly reviewed with patient the survival skills of diabetes including monitoring, normal blood glucose levels, signs and symptoms of high and low blood sugars, injection areas for insulin and healthy eating.  Patient was very engaged and wrote notes regarding information.  He had both an insulin starter kit and "Living Well with Diabetes"  Book.  He was able to give insulin injection earlier with RN, but still needs to review how to draw up.  While I was in the room the patient demonstrated how to check a blood sugar and place the blood on the strip.  Explained the importance of recording blood sugars and seeing his PCP asap for potential adjustments in insulin.  Discussed the rationale for glucose control and A1C goals.  Discussed the effects of steroids on blood glucose and that when steroids reduced he will likely need reduction in insulin doses also.  Will also order outpatient  diabetes education for patient per protocol.  Thanks, Adah Perl, RN, BC-ADM Inpatient Diabetes Coordinator Pager [phone] (8a-5p)

## 2014-07-27 NOTE — Discharge Summary (Signed)
Physician Discharge Summary  [name] PFX:[id number] DOB: 1965/04/28 DOA: 07/21/2014  PCP: No PCP Per Patient  Admit date: 07/21/2014 Discharge date: 07/27/2014  Time spent: 45 minutes  Recommendations for Outpatient Follow-up:  -Will be discharged home today. -Will need close follow-up with outpatient primary care physician for his new onset diabetes. -We'll also need close follow-up with neurology and neurosurgery for his intracranial lesions with vasogenic edema.   Discharge Diagnoses:  Principal Problem:   Seizures Active Problems:   Malignant neoplasm of rectum   DKA (diabetic ketoacidoses)   Hypertension   Hyperglycemia   Hyperosmolar (nonketotic) coma   Brain mass   Acute respiratory failure with hypercapnia   Discharge Condition: Stable and improved, anxious for discharge home today  Filed Weights   07/22/14 0300 07/22/14 0400 07/23/14 0430  Weight: 101 kg (222 lb 10.6 oz) 90.9 kg (200 lb 6.4 oz) 91.5 kg (201 lb 11.5 oz)    History of present illness:  [name] is a 48 y.o. male with a history of HTN and a remote hx of Rectal Ca S/P Resection who was witnessed at home by his wife as having seizure like activity lasting 5 minutes with decreased responsiveness, and a loss of urine. EMS was called and he was brought to the ED, and had 2 more episodes of tonic-clonic activity. He was medicated with IV Ativan and loaded with IV Keppra, and Sent for a CT scan of the Head. The Ct revealed a Fronto temporal mass 4.1 x 2.2 cm with vasogenic edema. Neurology Dr Leonel Ramsay was consulted and saw him in the ED. The patient is unable to give a history at this time, and his wife is at the bedside and reports that he has had polyuria, poly dipsia x 2-3 weeks. He has not had any previous history of Seizures or Diabetes.   Hospital Course:   New onset seizures -Secondary to brain lesions, appreciate neurology and neurosurgery following. -We'll continue  Keppra 500 mg twice daily. -Has had no further seizure activity while in the hospital.  Acute encephalopathy -Secondary to postictal state. -Completely resolved as of today.  Intracranial lesions -MRI brain with a large densely calcified right frontal lobe mass with surrounding edema, there is also a 3 mm enhancing nodule in the left insula as well as an 8 x 10 mm enhancing nodule in the anterior falx. -Was seen in consultation by Dr. Cyndy Freeze with neurosurgery. He recommended an MRA to ensure the calcification was not an AVM, which it wasn't. He believes the mass is too calcified to attempt stereotactic biopsy. His recommendation is to redo MRI in 2 months to look for any changes. -We'll continue dexamethasone for vasogenic edema and Keppra for seizure activity. Next line-will arrange follow-ups with both neurology and neurosurgery as an outpatient. -Patient mentions that he would like to see Dr. Jannifer Franklin with [facility] neurolog[facility].  Acute ventilatory dependent respiratory failure -Status post extubation 7/10, was extubated on the account of his post ictal state.  Acute renal failure -Creatinine appears to have leveled off at around 1.3-1.5 range, likely has a component of chronic renal failure.  Type 2 diabetes mellitus/honk  -Onset. -Patient has been seen by dietitian and feels comfortable with insulin injection. -We'll discharge him 40 mg of Levemir twice daily, he will be on steroids long-term so suspect his sugars will continue to be elevated. -I recommended close outpatient follow-up.    Procedures:  None   Consultations:  Critical care  Neurology  Neurosurgery  Discharge Instructions  Discharge Instructions    Increase activity slowly    Complete by:  As directed             Medication List    STOP taking these medications        hydrochlorothiazide 25 MG tablet  Commonly known as:  HYDRODIURIL      TAKE these medications        amLODipine 10 MG tablet    Commonly known as:  NORVASC  Take 1 tablet (10 mg total) by mouth daily.     cloNIDine 0.1 MG tablet  Commonly known as:  CATAPRES  Take 1 tablet (0.1 mg total) by mouth 3 (three) times daily.     dexamethasone 4 MG tablet  Commonly known as:  DECADRON  Take 1 tablet (4 mg total) by mouth every 8 (eight) hours.     folic acid 1 MG tablet  Commonly known as:  FOLVITE  Take 1 tablet (1 mg total) by mouth daily.     insulin detemir 100 UNIT/ML injection  Commonly known as:  LEVEMIR  Inject 0.4 mLs (40 Units total) into the skin 2 (two) times daily.     levETIRAcetam 500 MG tablet  Commonly known as:  KEPPRA  Take 1 tablet (500 mg total) by mouth 2 (two) times daily.     multivitamin with minerals Tabs tablet  Take 1 tablet by mouth daily.     thiamine 100 MG tablet  Take 1 tablet (100 mg total) by mouth daily.       No Known Allergies     Follow-up Information    Schedule an appointment as soon as possible for a visit in 1 week to follow up.   Why:  with your PCP with [facility]      Follow up with Lenor Coffin, MD. Schedule an appointment as soon as possible for a visit in 2 weeks.   Specialty:  Neurology   Contact information:   [address] [place] [address] [phone]       Follow up with CABBELL,KYLE L, MD. Schedule an appointment as soon as possible for a visit in 1 month.   Specialty:  Neurosurgery   Contact information:   [address] [place] [address] [phone]        The results of significant diagnostics from this hospitalization (including imaging, microbiology, ancillary and laboratory) are listed below for reference.    Significant Diagnostic Studies: Ct Head Wo Contrast  07/21/2014   CLINICAL DATA:  Status post seizure. Patient unresponsive. Hyperglycemia and high blood pressure. Tachycardia. Initial encounter.  EXAM: CT HEAD WITHOUT CONTRAST  TECHNIQUE: Contiguous axial images were obtained  from the base of the skull through the vertex without intravenous contrast.  COMPARISON:  None.  FINDINGS: There is no evidence of acute infarction, or intra- or extra-axial hemorrhage on CT.  There appears to be a diffusely calcified 4.1 x 2.2 cm mass at the high right frontoparietal region, with mild extension across the anterior commissure. Overlying vasogenic edema is noted, within the right frontoparietal region. No significant midline shift is seen.  Bilateral symmetric encephalomalacia is noted at the frontal lobes. This appearance, and the underlying thinning of the calvarium, is highly suggestive of chronic postradiation change. Per clinical correlation, the patient and family do not have any recollection of prior radiation therapy to the head.  The posterior fossa, including the cerebellum, brainstem and fourth ventricle, is  within normal limits. The third and lateral ventricles, and basal ganglia are unremarkable in appearance.  There is no evidence of acute fracture. There is diffuse thinning of the frontal calvarium bilaterally, as mentioned above. The orbits are within normal limits. The paranasal sinuses and mastoid air cells are well-aerated. No significant soft tissue abnormalities are seen.  IMPRESSION: 1. Diffusely calcified 4.1 x 2.2 cm mass at the high right frontoparietal region, with mild extension across the anterior commissure. Overlying vasogenic edema noted. No significant midline shift seen. This likely explains the patient's seizures; MRI with contrast would be helpful for further evaluation on an elective nonemergent basis, to exclude underlying enhancing lesions. 2. Bilateral symmetric encephalomalacia at the frontal lobes. This appearance, and the underlying thinning of the calvarium, is highly suggestive of chronic postradiation change, which would also explain the degree of calcification of the mass. Per clinical correlation, however, the patient and family do not have any  recollection of prior radiation therapy to the head. Would correlate further with the patient's history. These results were called by telephone at the time of interpretation on 07/21/2014 at 10:37 pm to Dr. Pamella Pert, who verbally acknowledged these results.   Electronically Signed   By: Garald Balding M.D.   On: 07/21/2014 22:42   Mr Jodene Nam Head Wo Contrast  07/25/2014   CLINICAL DATA:  Seizure. Right frontal calcified mass. AVM. Rectal carcinoma.  EXAM: MRA HEAD WITHOUT CONTRAST  TECHNIQUE: Angiographic images of the Circle of Willis were obtained using MRA technique without intravenous contrast.  COMPARISON:  MRI head 07/22/2014 and CT head 07/21/2014  FINDINGS: Negative for a vascular malformation. The calcified mass is not show abnormal vascularity. Negative for AVN. No large feeding vessels.  Internal carotid artery patent bilaterally without stenosis. Hypoplastic left A1 segment. Both anterior cerebral arteries are patent and supplied from the right. Posterior cerebral arteries are patent bilaterally  Internal carotid artery patent bilaterally without stenosis. Basilar widely patent. Superior cerebellar and posterior cerebral arteries are patent.  IMPRESSION: Negative MRA of the head.  No evidence of AVM.   Electronically Signed   By: Franchot Gallo M.D.   On: 07/25/2014 19:06   Mr Jeri Cos SF Contrast  07/22/2014   CLINICAL DATA:  New onset seizure. Calcified lung mass. History of head trauma as a child  EXAM: MRI HEAD WITHOUT AND WITH CONTRAST  TECHNIQUE: Multiplanar, multiecho pulse sequences of the brain and surrounding structures were obtained without and with intravenous contrast.  CONTRAST:  22mL MULTIHANCE GADOBENATE DIMEGLUMINE 529 MG/ML IV SOLN  COMPARISON:  CT head 07/21/2014  FINDINGS: Right medial frontal calcified brain mass has surrounding edema. It is difficult determine if this is intra-axial or extra-axial. The mass is densely calcified on CT and is adjacent to the inferior falx and  crosses under the falx to the left of midline. Overall I would favor this is intra-axial. The mass probably is in the cingulate gyrus. There is a moderate amount of surrounding edema. Following contrast infusion, there is peripheral enhancement with a lobular pattern. The mass measures approximately 35 x 18 mm on axial images.  Additional 8 x 10 mm enhancing lesion in the anterior falx. This also is calcified but has more of the appearance of benign calcification of the falx. Metastatic disease not excluded  3 mm enhancing nodule left insula, probable  metastatic disease.  Encephalomalacia in the inferior frontal lobes bilaterally consistent with a history of prior head trauma as a child.  Ventricle size normal.  Negative for acute infarct.  Gradient echo imaging reveals susceptibility throughout the calcified mass in the right frontal lobe. Additional small areas of susceptibility in the right posterior temporal lobe may represent chronic hemorrhage or mineralization but are not calcified on CT.  No other mass lesions identified.  IMPRESSION: Large densely calcified right frontal lobe mass with surrounding edema. The mass shows peripheral enhancement. It is difficult determine if this is arising from the inferior falx or from the brain however I would favor this is intra-axial. Given history of rectal cancer, this may be heavily calcified metastatic deposits particularly given the history of recent onset of seizure and surrounding edema. Meningioma is a possibility however the calcific pattern is unusual for meningioma.  3 mm enhancing nodule left insula, suspicious for metastatic disease.  8 x 10 mm enhancing nodule anterior falx associated with ossification of the falx. Possible metastatic disease versus enhancing bone marrow in the falx.  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   Electronically Signed   By: Franchot Gallo M.D.   On: 07/22/2014 15:42   Dg Chest Port 1 View  07/25/2014    CLINICAL DATA:  49 year old male with shortness of breath  EXAM: PORTABLE CHEST - 1 VIEW  COMPARISON:  Chest radiograph dated 07/25/2014  FINDINGS: Single-view of the chest demonstrate clear lungs. No focal consolidation, pleural effusion, or pneumothorax. Stable cardiomegaly. The osseous structures are grossly unremarkable.  IMPRESSION: No interval change.   Electronically Signed   By: Anner Crete M.D.   On: 07/25/2014 18:10   Dg Chest Port 1 View  07/25/2014   CLINICAL DATA:  Fever, history of rectal malignancy, brain mass, and acute respiratory failure.  EXAM: PORTABLE CHEST - 1 VIEW  COMPARISON:  Portable chest x-ray of July 23, 2014  FINDINGS: The esophagus and trachea have been extubated. The lungs are well-expanded. There are mildly prominent interstitial markings at the left lung base. There is no significant pleural effusion. The cardiac silhouette is enlarged. The pulmonary vascularity is not engorged. The bony thorax exhibits no acute abnormality.  IMPRESSION: 1. The lungs are well-expanded. There is left lower lobe atelectasis or pneumonia. 2. There is cardiomegaly without pulmonary edema.   Electronically Signed   By: David  Martinique M.D.   On: 07/25/2014 09:09   Dg Chest Port 1 View  07/23/2014   CLINICAL DATA:  Joint replacement and respiratory failure  EXAM: PORTABLE CHEST - 1 VIEW  COMPARISON:  Yesterday  FINDINGS: Very low lung volumes persist. Endotracheal tube advanced, now with its tip 3.2 cm from the carina. NG tube stable. Increasing bibasilar atelectasis. No pneumothorax.  IMPRESSION: Increasing bibasilar atelectasis.   Electronically Signed   By: Marybelle Killings M.D.   On: 07/23/2014 08:06   Dg Chest Port 1 View  07/22/2014   CLINICAL DATA:  Acute respiratory failure with hypercapnia. Nasogastric tube placement. On ventilator.  EXAM: PORTABLE CHEST - 1 VIEW  COMPARISON:  07/22/2014  FINDINGS: Endotracheal tube remains in appropriate position. A new nasogastric tube is seen with tip  in the mid stomach.  Low lung volumes are again seen, however no pulmonary consolidation or definite pleural effusion visualized. No evidence pneumothorax. Heart size remains stable.  IMPRESSION: New nasogastric tube with tip in mid stomach.  No active lung disease.   Electronically Signed   By: Earle Gell M.D.   On: 07/22/2014 12:38   Dg Chest Port 1 View  07/22/2014   CLINICAL DATA:  Intubation.  History of colon cancer.  EXAM: PORTABLE CHEST - 1 VIEW  COMPARISON:  Chest radiograph July 21, 2014  FINDINGS: Cardiac silhouette is upper limits of normal in size, mediastinal silhouette is nonsuspicious. Endotracheal tube tip projects 3.4 cm above the carina. Small LEFT pleural effusion with mild interstitial prominence/ bronchitic changes. Mild lingular consolidation with air bronchograms. No pneumothorax. Somewhat heterogeneous bone mineral density LEFT clavicle. Soft tissue planes are nonsuspicious.  IMPRESSION: Borderline cardiomegaly. Mild interstitial prominence and small LEFT pleural effusion could represent atypical infection/bronchitis with lingular suspected pneumonia.  Endotracheal tube tip projects 3.4 cm above the carina.  Heterogeneously dense LEFT clavicle, which could be artifact though given patient's history of malignancy, recommend follow-up.   Electronically Signed   By: Elon Alas M.D.   On: 07/22/2014 04:08   Dg Chest Port 1 View  07/21/2014   CLINICAL DATA:  New onset seizure.  EXAM: PORTABLE CHEST - 1 VIEW  COMPARISON:  None.  FINDINGS: The cardiomediastinal silhouette is unremarkable.  Mild bibasilar opacities are noted -favor atelectasis over airspace disease.  There is no evidence of pulmonary edema, suspicious pulmonary nodule/mass, pleural effusion, or pneumothorax. No acute bony abnormalities are identified.  IMPRESSION: Mild bibasilar opacities -favor atelectasis over airspace disease.   Electronically Signed   By: Margarette Canada M.D.   On: 07/21/2014 21:18     Microbiology: Recent Results (from the past 240 hour(s))  Blood Culture (routine x 2)     Status: None   Collection Time: 07/21/14  9:19 PM  Result Value Ref Range Status   Specimen Description BLOOD RIGHT ARM  Final   Special Requests BOTTLES DRAWN AEROBIC ONLY 5CC  Final   Culture NO GROWTH 5 DAYS  Final   Report Status 07/26/2014 FINAL  Final  Blood Culture (routine x 2)     Status: None   Collection Time: 07/21/14  9:26 PM  Result Value Ref Range Status   Specimen Description BLOOD RIGHT FOREARM  Final   Special Requests BOTTLES DRAWN AEROBIC ONLY 5CC  Final   Culture NO GROWTH 5 DAYS  Final   Report Status 07/26/2014 FINAL  Final  Urine culture     Status: None   Collection Time: 07/22/14 12:54 AM  Result Value Ref Range Status   Specimen Description URINE, CLEAN CATCH  Final   Special Requests NONE  Final   Culture 5,000 COLONIES/mL INSIGNIFICANT GROWTH  Final   Report Status 07/23/2014 FINAL  Final  MRSA PCR Screening     Status: None   Collection Time: 07/22/14  4:10 AM  Result Value Ref Range Status   MRSA by PCR NEGATIVE NEGATIVE Final    Comment:        The GeneXpert MRSA Assay (FDA approved for NASAL specimens only), is one component of a comprehensive MRSA colonization surveillance program. It is not intended to diagnose MRSA infection nor to guide or monitor treatment for MRSA infections.      Labs: Basic Metabolic Panel:  Recent Labs Lab 07/24/14 0238 07/25/14 0208 07/25/14 1455 07/26/14 0227 07/27/14 0421  NA 143 141 139 139 138  K 5.0 3.3* 3.7 4.6 4.6  CL 117* 111 109 108 107  CO2 17* 17* 16* 16* 20*  GLUCOSE 178* 219* 216* 297* 250*  BUN 9 9 8 14  28*  CREATININE 1.52* 1.43* 1.29* 1.38* 1.30*  CALCIUM 8.7* 8.8* 9.1 9.3 9.3  PHOS  --  4.1  --  4.8* 4.7*   Liver Function Tests:  Recent Labs Lab 07/21/14 2130 07/25/14 0208  07/26/14 0227 07/27/14 0421  AST 32  --   --   --   ALT 44  --   --   --   ALKPHOS 151*  --   --   --    BILITOT 0.7  --   --   --   PROT 7.4  --   --   --   ALBUMIN 4.1 3.1* 3.1* 3.2*   No results for input(s): LIPASE, AMYLASE in the last 168 hours. No results for input(s): AMMONIA in the last 168 hours. CBC:  Recent Labs Lab 07/21/14 2130  07/22/14 0545 07/23/14 0332 07/25/14 0208 07/26/14 0227 07/27/14 0421  WBC 4.4  < > 5.9 5.8 6.3 8.4 11.7*  NEUTROABS 2.9  --   --   --  5.0 7.6 10.1*  HGB 14.0  < > 12.9* 12.4* 12.4* 13.2 12.9*  HCT 40.2  < > 37.0* 36.1* 35.7* 37.7* 37.6*  MCV 78.5  < > 77.1* 79.3 76.8* 76.9* 77.0*  PLT 193  < > 180 185 155 189 226  < > = values in this interval not displayed. Cardiac Enzymes: No results for input(s): CKTOTAL, CKMB, CKMBINDEX, TROPONINI in the last 168 hours. BNP: BNP (last 3 results) No results for input(s): BNP in the last 8760 hours.  ProBNP (last 3 results) No results for input(s): PROBNP in the last 8760 hours.  CBG:  Recent Labs Lab 07/26/14 1700 07/26/14 2110 07/27/14 0030 07/27/14 0402 07/27/14 0810  GLUCAP 314* 328* 190* 235* 272*       Signed:  Lelon Frohlich  [facility] Pager: [phone] 07/27/2014, 9:31 AM

## 2014-07-27 NOTE — Care Management Note (Addendum)
Case Management Note  Patient Details  Name: [name] MRN: [id number] Date of Birth: 1965/01/24  Subjective/Objective:                 Referral made to outpatient rehab. Patient given [facility] number to find new PCP with [facility]. Patient states that he has a 3 in 1, walker and cane at home. Patient denies any further needs.    Action/Plan:  Will discharge today to home. Given information on [employer] Diabetic testing supplies and how to submit application for Levemir $25 copay card through their website. Expected Discharge Date:                  Expected Discharge Plan:  Home/Self Care  In-House Referral:     Discharge planning Services  CM Consult  Post Acute Care Choice:    Choice offered to:  Patient  DME Arranged:    DME Agency:     HH Arranged:    [place] Agency:     Status of Service:  Completed, signed off  Medicare Important Message Given:    Date Medicare IM Given:    Medicare IM give by:    Date Additional Medicare IM Given:    Additional Medicare Important Message give by:     If discussed at [place] of Stay Meetings, dates discussed:    Additional Comments:  Carles Collet, RN 07/27/2014, 10:24 AM

## 2014-07-27 NOTE — Progress Notes (Signed)
Patient has been educated several times about getting up without help and about wearing his scd's. Patient has refused the bed alarm and asked the scd's not be placed on him. RN and NT monitoring patient.

## 2014-07-27 NOTE — Progress Notes (Addendum)
Occupational Therapy Evaluation Patient Details Name: [name] MRN: [id number] DOB: [birth year] Today's Date: 07/27/2014    History of Present Illness 49y.o. male admitted to [facility] on 07/21/14 for seizure.  Pt's MRI revealed calcified mass with surrounding edema in the right frontal lobe.  Pt with significant PMHx of HTN, DM, rectal CA.  Hospital course complicated by urinary incontinence.     Clinical Impression   Patient admitted with above. Patient independent PTA. Patient currently functioning at an overall independent -> mod I level. D/C from acute OT services and no additional follow-up OT needs at this time. All appropriate education provided to patient. Please re-order OT if needed.    Patient will be getting close follow-up with neurology and neurosurgery for his intracranial lesions with vasogenic edema.   Patient may need follow-up OT depending on progression of lesions and/or edema. At this time, pt functioning at an overall mod I level and doesn't need acute OT.     Follow Up Recommendations  No OT follow up;Supervision - Intermittent    Equipment Recommendations  None recommended by OT    Recommendations for Other Services  None at this time   Precautions / Restrictions Precautions Precautions: Fall Restrictions Weight Bearing Restrictions: No      Mobility Bed Mobility Overal bed mobility: Independent  Transfers Overall transfer level: Modified independent Equipment used: None Transfers: Sit to/from Stand General transfer comment: Pt takes extra time, but able to perform transfers at a mod I level. Recommending supervision for in room mobility to decrease risk of falls secondary to unfamiliar environment    Balance Overall balance assessment: Needs assistance Sitting-balance support: No upper extremity supported;Feet supported Sitting balance-Leahy Scale: Good     Standing balance support: No upper extremity supported;During functional  activity Standing balance-Leahy Scale: Fair    ADL Overall ADL's : Modified independent   General ADL Comments: Pt overall mod I with ADLs. Pt takes extra time, but does so without need for assistance or supervision at this time. This therapist recommends that patient calls for assistance while in the hospital to decrease risk of fall in this unfamiliar enviornment. Educated patient on use of 3-in-1 in tub/shower as needed to decrease risk of falls in shower at home. In simulated scenario, pt able to independently step up over tub.     Vision Additional Comments: Pt reports no change from baseline          Pertinent Vitals/Pain Pain Assessment: No/denies pain   Extremity/Trunk Assessment Upper Extremity Assessment Upper Extremity Assessment: Overall WFL for tasks assessed RUE Coordination: decreased fine motor;decreased gross motor (but WFL) LUE Coordination: decreased fine motor;decreased gross motor (but [facility])   Lower Extremity Assessment Lower Extremity Assessment: Defer to PT evaluation   Cervical / Trunk Assessment Cervical / Trunk Assessment: Normal   Communication Communication Communication: No difficulties   Cognition Arousal/Alertness: Awake/alert Behavior During Therapy: Impulsive Overall Cognitive Status: Within Functional Limits for tasks assessed              Home Living Family/patient expects to be discharged to:: Private residence Living Arrangements: Spouse/significant other;Children (37 yo daughter) Available Help at Discharge: Family Type of Home: House Home Access: Stairs to enter Technical brewer of Steps: 2 Entrance Stairs-Rails: None Home Layout: One level     Bathroom Shower/Tub: Tub/shower unit;Curtain   Biochemist, clinical: Standard     Home Equipment: Environmental consultant - 2 wheels;Cane - single point;Bedside commode   Additional Comments: pt has equipment from prior L THA  Prior Functioning/Environment Level of Independence: Independent      OT Diagnosis: Generalized weakness   OT Problem List:  n/a, no acute OT needs identified    OT Treatment/Interventions:   n/a, no acute OT needs identified    OT Goals(Current goals can be found in the care plan section) Acute Rehab OT Goals Patient Stated Goal: to go home, get back to normal OT Goal Formulation: All assessment and education complete, DC therapy  OT Frequency:   n/a, no acute OT needs identified    Barriers to D/C:  None known at this time   End of Session Activity Tolerance: Patient tolerated treatment well Patient left: in chair;with call bell/phone within reach   Time: 0840-0901 OT Time Calculation (min): 21 min Charges:  OT General Charges $OT Visit: 1 Procedure OT Evaluation $Initial OT Evaluation Tier I: 1 Procedure  Posie Lillibridge , MS, OTR/L, CLT Pager: [phone]  07/27/2014, 9:17 AM

## 2014-07-27 NOTE — Progress Notes (Signed)
Subjective: Patient is doing well, no further seizures. He is currently being discharged and sent home on Keppra 500 mg BID. Tolerating Steroids and Keppra.  No further incontinence noted.  Objective: Current vital signs: BP 146/95 mmHg  Pulse 85  Temp(Src) 98.1 F (36.7 C) (Oral)  Resp 18  Ht 5\' 6"  (1.676 m)  Wt 91.5 kg (201 lb 11.5 oz)  BMI 32.57 kg/m2  SpO2 98% Vital signs in last 24 hours: Temp:  [97.5 F (36.4 C)-98.1 F (36.7 C)] 98.1 F (36.7 C) (07/14 0806) Pulse Rate:  [85-111] 85 (07/14 0806) Resp:  [18-26] 18 (07/14 0806) BP: (132-163)/(78-98) 146/95 mmHg (07/14 0806) SpO2:  [94 %-100 %] 98 % (07/14 0806)  Intake/Output from previous day: 07/13 0701 - 07/14 0700 In: 462 [P.O.:462] Out: 1475 [Urine:1475] Intake/Output this shift: Total I/O In: -  Out: 350 [Urine:350] Nutritional status: Diet heart healthy/carb modified Room service appropriate?: Yes; Fluid consistency:: Thin  Neurologic Exam: General: Mental Status: Alert, oriented, thought content appropriate.  Speech fluent without evidence of aphasia.  Able to follow 3 step commands without difficulty. Cranial Nerves: HM:CNOBSJ fields grossly normal, pupils equal, round, reactive to light and accommodation III,IV, VI: ptosis not present, extra-ocular motions intact bilaterally V,VII: smile symmetric, facial light touch sensation normal bilaterally VIII: hearing normal bilaterally IX,X: uvula rises symmetrically XI: bilateral shoulder shrug XII: midline tongue extension without atrophy or fasciculations  Motor: Right : Upper extremity   5/5    Left:     Upper extremity   5/5  Lower extremity   5/5     Lower extremity   5/5 Tone and bulk:normal tone throughout; no atrophy noted Sensory: Pinprick and light touch intact throughout, bilaterally Deep Tendon Reflexes:  1+ throughout with no AJ Plantars: Right: downgoing   Left: downgoing    Lab Results: Basic Metabolic Panel:  Recent Labs Lab  07/24/14 0238 07/25/14 0208 07/25/14 1455 07/26/14 0227 07/27/14 0421  NA 143 141 139 139 138  K 5.0 3.3* 3.7 4.6 4.6  CL 117* 111 109 108 107  CO2 17* 17* 16* 16* 20*  GLUCOSE 178* 219* 216* 297* 250*  BUN 9 9 8 14  28*  CREATININE 1.52* 1.43* 1.29* 1.38* 1.30*  CALCIUM 8.7* 8.8* 9.1 9.3 9.3  PHOS  --  4.1  --  4.8* 4.7*    Liver Function Tests:  Recent Labs Lab 07/21/14 2130 07/25/14 0208 07/26/14 0227 07/27/14 0421  AST 32  --   --   --   ALT 44  --   --   --   ALKPHOS 151*  --   --   --   BILITOT 0.7  --   --   --   PROT 7.4  --   --   --   ALBUMIN 4.1 3.1* 3.1* 3.2*   No results for input(s): LIPASE, AMYLASE in the last 168 hours. No results for input(s): AMMONIA in the last 168 hours.  CBC:  Recent Labs Lab 07/21/14 2130  07/22/14 0545 07/23/14 0332 07/25/14 0208 07/26/14 0227 07/27/14 0421  WBC 4.4  < > 5.9 5.8 6.3 8.4 11.7*  NEUTROABS 2.9  --   --   --  5.0 7.6 10.1*  HGB 14.0  < > 12.9* 12.4* 12.4* 13.2 12.9*  HCT 40.2  < > 37.0* 36.1* 35.7* 37.7* 37.6*  MCV 78.5  < > 77.1* 79.3 76.8* 76.9* 77.0*  PLT 193  < > 180 185 155 189 226  < > = values  in this interval not displayed.  Cardiac Enzymes: No results for input(s): CKTOTAL, CKMB, CKMBINDEX, TROPONINI in the last 168 hours.  Lipid Panel: No results for input(s): CHOL, TRIG, HDL, CHOLHDL, VLDL, LDLCALC in the last 168 hours.  CBG:  Recent Labs Lab 07/26/14 2110 07/27/14 0030 07/27/14 0402 07/27/14 0810 07/27/14 1150  GLUCAP 328* 190* 235* 272* 351*    Microbiology: Results for orders placed or performed during the hospital encounter of 07/21/14  Blood Culture (routine x 2)     Status: None   Collection Time: 07/21/14  9:19 PM  Result Value Ref Range Status   Specimen Description BLOOD RIGHT ARM  Final   Special Requests BOTTLES DRAWN AEROBIC ONLY 5CC  Final   Culture NO GROWTH 5 DAYS  Final   Report Status 07/26/2014 FINAL  Final  Blood Culture (routine x 2)     Status: None    Collection Time: 07/21/14  9:26 PM  Result Value Ref Range Status   Specimen Description BLOOD RIGHT FOREARM  Final   Special Requests BOTTLES DRAWN AEROBIC ONLY 5CC  Final   Culture NO GROWTH 5 DAYS  Final   Report Status 07/26/2014 FINAL  Final  Urine culture     Status: None   Collection Time: 07/22/14 12:54 AM  Result Value Ref Range Status   Specimen Description URINE, CLEAN CATCH  Final   Special Requests NONE  Final   Culture 5,000 COLONIES/mL INSIGNIFICANT GROWTH  Final   Report Status 07/23/2014 FINAL  Final  MRSA PCR Screening     Status: None   Collection Time: 07/22/14  4:10 AM  Result Value Ref Range Status   MRSA by PCR NEGATIVE NEGATIVE Final    Comment:        The GeneXpert MRSA Assay (FDA approved for NASAL specimens only), is one component of a comprehensive MRSA colonization surveillance program. It is not intended to diagnose MRSA infection nor to guide or monitor treatment for MRSA infections.     Coagulation Studies: No results for input(s): LABPROT, INR in the last 72 hours.  Imaging: Mr Virgel Paling VH Contrast  07/25/2014   CLINICAL DATA:  Seizure. Right frontal calcified mass. AVM. Rectal carcinoma.  EXAM: MRA HEAD WITHOUT CONTRAST  TECHNIQUE: Angiographic images of the Circle of Willis were obtained using MRA technique without intravenous contrast.  COMPARISON:  MRI head 07/22/2014 and CT head 07/21/2014  FINDINGS: Negative for a vascular malformation. The calcified mass is not show abnormal vascularity. Negative for AVN. No large feeding vessels.  Internal carotid artery patent bilaterally without stenosis. Hypoplastic left A1 segment. Both anterior cerebral arteries are patent and supplied from the right. Posterior cerebral arteries are patent bilaterally  Internal carotid artery patent bilaterally without stenosis. Basilar widely patent. Superior cerebellar and posterior cerebral arteries are patent.  IMPRESSION: Negative MRA of the head.  No evidence of  AVM.   Electronically Signed   By: Franchot Gallo M.D.   On: 07/25/2014 19:06   Dg Chest Port 1 View  07/25/2014   CLINICAL DATA:  49 year old male with shortness of breath  EXAM: PORTABLE CHEST - 1 VIEW  COMPARISON:  Chest radiograph dated 07/25/2014  FINDINGS: Single-view of the chest demonstrate clear lungs. No focal consolidation, pleural effusion, or pneumothorax. Stable cardiomegaly. The osseous structures are grossly unremarkable.  IMPRESSION: No interval change.   Electronically Signed   By: Anner Crete M.D.   On: 07/25/2014 18:10    Medications:  Scheduled: . amLODipine  10  mg Oral Daily  . cloNIDine  0.1 mg Oral TID  . dexamethasone  4 mg Oral 3 times per day  . folic acid  1 mg Oral Daily  . insulin aspart  0-15 Units Subcutaneous TID WC & HS  . insulin aspart  4 Units Subcutaneous TID WC  . insulin detemir  30 Units Subcutaneous BID  . levETIRAcetam  500 mg Oral BID  . multivitamin with minerals  1 tablet Oral Daily  . thiamine  100 mg Oral Daily   Etta Quill PA-C [facility] [phone]  07/27/2014, 12:52 PM  Patient seen and examined.  Clinical course and management discussed.  Necessary edits performed.  I agree with the above.  Assessment and plan of care developed and discussed below.    Assessment/Plan: Patient without further seizures on Keppra. On Dexamethasone for edema.  Incontinence improved.    Recommendations: 1.  Follow up with neurology on an outpatient basis.    Case discussed with Dr. Coral Spikes, MD [facility] [phone]  07/27/2014  1:42 PM

## 2014-07-27 NOTE — Progress Notes (Signed)
Patient ID: [name], male   DOB: 12-21-1965, 49 y.o.   MRN: [id number] BP 146/95 mmHg  Pulse 85  Temp(Src) 98.1 F (36.7 C) (Oral)  Resp 18  Ht 5\' 6"  (1.676 m)  Wt 91.5 kg (201 lb 11.5 oz)  BMI 32.57 kg/m2  SpO2 98%] Alert and oriented, speech clear, fluent Exam is excellent  Will follow up with [name] and have left information. Doing much better.

## 2014-08-11 ENCOUNTER — Ambulatory Visit (INDEPENDENT_AMBULATORY_CARE_PROVIDER_SITE_OTHER): Payer: BLUE CROSS/BLUE SHIELD | Admitting: Neurology

## 2014-08-11 ENCOUNTER — Encounter: Payer: Self-pay | Admitting: Neurology

## 2014-08-11 VITALS — BP 114/75 | HR 69 | Ht 64.0 in | Wt 193.0 lb

## 2014-08-11 DIAGNOSIS — R569 Unspecified convulsions: Secondary | ICD-10-CM | POA: Diagnosis not present

## 2014-08-11 DIAGNOSIS — G939 Disorder of brain, unspecified: Secondary | ICD-10-CM

## 2014-08-11 DIAGNOSIS — G9389 Other specified disorders of brain: Secondary | ICD-10-CM
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
## 2014-08-11 NOTE — Patient Instructions (Addendum)
    Stay on [place] for now, call if you have any concerns. No driving for at least 6 months.   Seizure, Adult A seizure is abnormal electrical activity in the brain. Seizures usually last from 30 seconds to 2 minutes. There are various types of seizures. Before a seizure, you may have a warning sensation (aura) that a seizure is about to occur. An aura may include the following symptoms:   Fear or anxiety.  Nausea.  Feeling like the room is spinning (vertigo).  Vision changes, such as seeing flashing lights or spots. Common symptoms during a seizure include:  A change in attention or behavior (altered mental status).  Convulsions with rhythmic jerking movements.  Drooling.  Rapid eye movements.  Grunting.  Loss of bladder and bowel control.  Bitter taste in the mouth.  Tongue biting. After a seizure, you may feel confused and sleepy. You may also have an injury resulting from convulsions during the seizure. HOME CARE INSTRUCTIONS   If you are given medicines, take them exactly as prescribed by your health care provider.  Keep all follow-up appointments as directed by your health care provider.  Do not swim or drive or engage in risky activity during which a seizure could cause further injury to you or others until your health care provider says it is OK.  Get adequate rest.  Teach friends and family what to do if you have a seizure. They should:  Lay you on the ground to prevent a fall.  Put a cushion under your head.  Loosen any tight clothing around your neck.  Turn you on your side. If vomiting occurs, this helps keep your airway clear.  Stay with you until you recover.  Know whether or not you need emergency care. SEEK IMMEDIATE MEDICAL CARE IF:  The seizure lasts longer than 5 minutes.  The seizure is severe or you do not wake up immediately after the seizure.  You have an altered mental status after the seizure.  You are having more frequent or  worsening seizures. Someone should drive you to the emergency department or call local emergency services (911 in [place] MAKE SURE YOU:  Understand these instructions.  Will watch your condition.  Will get help right away if you are not doing well or get worse. Document Released: 12/28/1999 Document Revised: 10/20/2012 Document Reviewed: 08/11/2012 [facility] Patient Information 2015 [place], [place]. This information is not intended to replace advice given to you by your health care provider. Make sure you discuss any questions you have with your health care provider.

## 2014-08-15 ENCOUNTER — Encounter: Payer: Self-pay | Admitting: *Deleted

## 2014-08-15 ENCOUNTER — Encounter: Payer: BLUE CROSS/BLUE SHIELD | Attending: Family Medicine | Admitting: *Deleted

## 2014-08-15 VITALS — Ht 64.0 in | Wt 192.2 lb

## 2014-08-15 DIAGNOSIS — Z713 Dietary counseling and surveillance: Secondary | ICD-10-CM | POA: Insufficient documentation

## 2014-08-15 DIAGNOSIS — Z794 Long term (current) use of insulin: Secondary | ICD-10-CM | POA: Insufficient documentation

## 2014-08-15 DIAGNOSIS — E119 Type 2 diabetes mellitus without complications: Secondary | ICD-10-CM | POA: Insufficient documentation

## 2014-08-15 NOTE — Progress Notes (Signed)
Diabetes Self-Management Education  Visit Type: First/Initial (DX 07/21/14)  Appt. Start Time: 0930 Appt. End Time:  1100  08/15/2014  [name], identified by name and date of birth, is a 49 y.o. male with a diagnosis of Diabetes: Type 2.  Other people present during visit:  Spouse/SO [name], Patient [name] was admitted to [facility] on 07/21/14 with a glucose of 1094mg /dl. He had three seizures. He had been having symptoms of hyperglycemia. Brain lesions were found during this hospitalization. There is  discussion as to the cause of the seizures being neurological or glycemic in nature. I contacted Dr. Macky Lower office requesting the results of his A1c and Lipid profile. He has been testing his glucose 2 times daily with a ReliOn glucometer.  I have dispensed: One Touch Verio Flex. I phones Dr. Macky Lower office requesting an order for testing supplies to [facility] [place]  Lot: [id number] X Exp: 09/2015 2hpp 502mg /dl (grapes)  ASSESSMENT  Height 5\' 4"  (1.626 m), weight 192 lb 3.2 oz (87.181 kg). Body mass index is 32.97 kg/(m^2).  Initial Visit Information:  Are you currently following a meal plan?: Yes What type of meal plan do you follow?: less sodium, less cheese Are you taking your medications as prescribed?: Yes How often do you need to have someone help you when you read instructions, pamphlets, or other written materials from your doctor or pharmacy?: 1 - Never What is the last grade level you completed in school?: Bachelor Degree  Psychosocial:   Patient Belief/Attitude about Diabetes: Motivated to manage diabetes Self-care barriers: None Self-management support: Doctor's office, Family, CDE visits Other persons present: Spouse/SO, Patient Patient Concerns: Nutrition/Meal planning, Medication, Monitoring, Healthy Lifestyle Special Needs: None Preferred Learning Style: No preference indicated Learning Readiness: Ready  Complications:   Last HgB A1C per  patient/outside source:  (not available) How often do you check your blood sugar?: 1-2 times/day  Diet Intake:  Breakfast: egg whites, cheese, bacon or sausage, slice toast / grits / oatmeal  Exercise:  Exercise: ADL's  Individualized Plan for Diabetes Self-Management Training:   Learning Objective:  Patient will have a greater understanding of diabetes self-management. Patient education plan per assessed needs and concerns is to attend individual sessions     Education Topics Reviewed with Patient Today:  Definition of diabetes, type 1 and 2, and the diagnosis of diabetes Role of diet in the treatment of diabetes and the relationship between the three main macronutrients and blood glucose level, Meal options for control of blood glucose level and chronic complications. Role of exercise on diabetes management, blood pressure control and cardiac health., Helped patient identify appropriate exercises in relation to his/her diabetes, diabetes complications and other health issue. Taught/evaluated SMBG meter., Identified appropriate SMBG and/or A1C goals. Worked with patient to identify barriers to care and solutions Lifestyle issues that need to be addressed for better diabetes care  PATIENTS GOALS/Plan (Developed by the patient):  Nutrition: General guidelines for healthy choices and portions discussed Physical Activity: Exercise 3-5 times per week, 30 minutes per day Monitoring : test my blood glucose as discussed (note x per day with comment) (at least 2 times daily. FBS & vary)  Patient Instructions  Plan:  ALWAYS Include protein with your meals and snacks Consider  increasing your activity level by 30 for 5 minutes daily as tolerated.. Taking baby steps to get there Continue checking BG at alternate times per day to include fasting daily and alternate between before meals and 2 hours after first bite of  meal  Continue taking medication  as directed by MD  ALWAYS HAVE PROTEIN  WHEN ANYTHING [place] Protein Bars Always have snack before bed   Expected Outcomes:  Demonstrated interest in learning. Expect positive outcomes  Education material provided: Living Well with Diabetes, A1C conversion sheet, Meal plan card, My Plate, Snack sheet, Support group flyer and Carbohydrate counting sheet  If problems or questions, patient to contact team via:  Phone  Future DSME appointment: 4-6 wks

## 2014-08-15 NOTE — Patient Instructions (Signed)
Plan:  ALWAYS Include protein with your meals and snacks Consider  increasing your activity level by 30 for 5 minutes daily as tolerated.. Taking baby steps to get there Continue checking BG at alternate times per day to include fasting daily and alternate between before meals and 2 hours after first bite of meal  Continue taking medication  as directed by MD  [place] ANYTHING [place] Always have snack before bed

## 2014-08-18 ENCOUNTER — Ambulatory Visit: Payer: BLUE CROSS/BLUE SHIELD | Admitting: Neurology

## 2014-08-30 ENCOUNTER — Other Ambulatory Visit: Payer: Self-pay | Admitting: Neurosurgery

## 2014-08-30 DIAGNOSIS — C7949 Secondary malignant neoplasm of other parts of nervous system: Principal | ICD-10-CM

## 2014-08-30 DIAGNOSIS — C7931 Secondary malignant neoplasm of brain: Secondary | ICD-10-CM

## 2014-09-04 ENCOUNTER — Ambulatory Visit
Admission: RE | Admit: 2014-09-04 | Discharge: 2014-09-04 | Disposition: A | Discharge status: Home / Self Care | Payer: BLUE CROSS/BLUE SHIELD | Source: Ambulatory Visit | Attending: Neurosurgery | Admitting: Neurosurgery

## 2014-09-04 DIAGNOSIS — C7931 Secondary malignant neoplasm of brain: Secondary | ICD-10-CM

## 2014-09-04 DIAGNOSIS — C7949 Secondary malignant neoplasm of other parts of nervous system: Principal | ICD-10-CM

## 2014-09-04 MED ORDER — GADOBENATE DIMEGLUMINE 529 MG/ML IV SOLN
18.0000 mL | Freq: Once | INTRAVENOUS | Status: AC | PRN
  Administered 2014-09-04: 18 mL via INTRAVENOUS

## 2014-09-19 DIAGNOSIS — Z0271 Encounter for disability determination: Secondary | ICD-10-CM

## 2014-09-20 ENCOUNTER — Encounter: Payer: Self-pay | Admitting: *Deleted

## 2014-09-20 ENCOUNTER — Encounter: Payer: BLUE CROSS/BLUE SHIELD | Attending: Family Medicine | Admitting: *Deleted

## 2014-09-20 VITALS — Wt 192.5 lb

## 2014-09-20 DIAGNOSIS — Z713 Dietary counseling and surveillance: Secondary | ICD-10-CM | POA: Insufficient documentation

## 2014-09-20 DIAGNOSIS — Z794 Long term (current) use of insulin: Secondary | ICD-10-CM | POA: Diagnosis not present

## 2014-09-20 DIAGNOSIS — E119 Type 2 diabetes mellitus without complications: Secondary | ICD-10-CM | POA: Insufficient documentation

## 2014-09-20 NOTE — Patient Instructions (Addendum)
Start testing glucose 2 hours after dinner ideally reading <140 / Fasting < 126 Consider Special K protein Cereal for breakfast with milk Continue with [place] - they have both carbs and protein Have a piece of fruit & add some nuts  You are doing a good job...Marland KitchenMarland KitchenMarland Kitchen Keep up the good work

## 2014-09-26 NOTE — Progress Notes (Signed)
Diabetes Self-Management Education  Visit Type: Follow-up  Appt. Start Time: 1500   Appt. End Time: 1600  09/26/2014  [name], identified by name and date of birth, is a 49 y.o. male with a diagnosis of Diabetes: Type 2.   ASSESSMENT  Weight 192 lb 8 oz (87.317 kg). Body mass index is 33.03 kg/(m^2).      Diabetes Self-Management Education - 09/20/14 1514    Visit Information   Visit Type Follow-up   Initial Visit   Diabetes Type Type 2   Are you currently following a meal plan? Yes   Are you taking your medications as prescribed? Yes   Psychosocial Assessment   Patient Belief/Attitude about Diabetes Motivated to manage diabetes   Self-care barriers None   Self-management support Doctor's office;CDE visits   Other persons present Patient   Patient Concerns Nutrition/Meal planning;Monitoring;Glycemic Control   Special Needs None   Preferred Learning Style No preference indicated   Learning Readiness Change in progress   How often do you need to have someone help you when you read instructions, pamphlets, or other written materials from your doctor or pharmacy? 1 - Never   Complications   How often do you check your blood sugar? 1-2 times/day   Fasting Blood glucose range (mg/dL) 70-129;130-179  88-172   Postprandial Blood glucose range (mg/dL) --  30 mins after meal 151-391 - will start testing 2hpp   Dietary Intake   Breakfast raisin bran / corn flakes / shredded wheat /  nut cheerios /    Lunch open face [place] / ground [place] burger wrapped in lettuce with 1/2C rice, vegetables   Dinner protein source, brocolli, tomato, 1/2 pita   Beverage(s) water, with flavor enhancers, sugar free lemonade,  rare diet soda, ice tea with splenda   Exercise   Exercise Type Light (walking / raking leaves)   How many days per week to you exercise? 2   How many minutes per day do you exercise? 60   Total minutes per week of exercise 120   Patient Education   Previous  Diabetes Education Yes (please comment)   Nutrition management  Role of diet in the treatment of diabetes and the relationship between the three main macronutrients and blood glucose level;Information on hints to eating out and maintain blood glucose control.;Meal options for control of blood glucose level and chronic complications.   Physical activity and exercise  Role of exercise on diabetes management, blood pressure control and cardiac health.   Monitoring Identified appropriate SMBG and/or A1C goals.   Psychosocial adjustment Role of stress on diabetes   Personal strategies to promote health Lifestyle issues that need to be addressed for better diabetes care   Individualized Goals (developed by patient)   Nutrition General guidelines for healthy choices and portions discussed   Physical Activity Exercise 3-5 times per week;30 minutes per day   Medications take my medication as prescribed   Monitoring  test my blood glucose as discussed   Patient Self-Evaluation of Goals - Patient rates self as meeting previously set goals (% of time)   Nutrition >75%   Physical Activity >75%   Medications >75%   Monitoring 50 - 75 %   Problem Solving 50 - 75 %   Outcomes   Expected Outcomes Demonstrated interest in learning. Expect positive outcomes   Future DMSE PRN   Program Status Completed      Individualized Plan for Diabetes Self-Management Training:   Learning Objective:  Patient will  have a greater understanding of diabetes self-management. Patient education plan is to attend individual and/or group sessions per assessed needs and concerns.   Plan:   Patient Instructions  Start testing glucose 2 hours after dinner ideally reading <140 / Fasting < 126 Consider Special K protein Cereal for breakfast with milk Continue with [place] - they have both carbs and protein Have a piece of fruit & add some nuts  You are doing a good job...Marland KitchenMarland KitchenMarland Kitchen Keep up the good  work   Expected Outcomes:  Demonstrated interest in learning. Expect positive outcomes  If problems or questions, patient to contact team via:  Phone  Future DSME appointment: PRN

## 2014-10-06 DIAGNOSIS — Z0271 Encounter for disability determination: Secondary | ICD-10-CM

## 2014-11-16 DIAGNOSIS — Z0271 Encounter for disability determination: Secondary | ICD-10-CM

## 2014-11-24 ENCOUNTER — Encounter: Payer: Self-pay | Admitting: Neurology

## 2014-11-24 ENCOUNTER — Ambulatory Visit (INDEPENDENT_AMBULATORY_CARE_PROVIDER_SITE_OTHER): Payer: BLUE CROSS/BLUE SHIELD | Admitting: Neurology

## 2014-11-24 VITALS — BP 140/89 | HR 88 | Ht 64.0 in | Wt 195.5 lb

## 2014-11-24 DIAGNOSIS — G939 Disorder of brain, unspecified: Secondary | ICD-10-CM | POA: Diagnosis not present

## 2014-11-24 DIAGNOSIS — R569 Unspecified convulsions: Secondary | ICD-10-CM | POA: Diagnosis not present

## 2014-11-24 DIAGNOSIS — G9389 Other specified disorders of brain: Secondary | ICD-10-CM

## 2014-11-24 MED ORDER — SILDENAFIL CITRATE 100 MG PO TABS: 100.0000 mg | ORAL_TABLET | Freq: Every day | ORAL | Status: DC | PRN

## 2014-11-24 MED ORDER — LEVETIRACETAM 500 MG PO TABS: 500.0000 mg | ORAL_TABLET | Freq: Two times a day (BID) | ORAL | Status: DC

## 2014-11-24 NOTE — Patient Instructions (Signed)

## 2014-11-24 NOTE — Progress Notes (Signed)
Reason for visit: Seizures  [name] is an 49 y.o. male  History of present illness:  Mr. Burow is a 49 year old right-handed black male with a history of seizures. The patient has a history of prior head trauma, and he is being followed for a parafalcine mass that appears to be heavily calcified. The patient is being followed by Dr. Christella Noa, he last had MRI evaluation in August, he is to have another study in the near future. The patient is on Keppra, he has done well with this, and he is tolerating medication. He has not had any recurrent seizures. He returns for an evaluation. He does report some problems with impotence that has worsened since the seizure. He does have a history of diabetes and hypertension. The patient reports occasional headaches.  Past Medical History  Diagnosis Date  . Diabetes mellitus without complication ([place])   . Hypertension   . Colon cancer ([place])     rectal  . Seizure [facility])     Past Surgical History  Procedure Laterality Date  . Joint replacement Left 2004    Left hip  . Total hip arthroplasty    . Appendectomy      Family History  Problem Relation Age of Onset  . Hypertension Mother   . Heart disease Mother   . Hyperlipidemia Mother   . Heart failure Mother   . Hypertension Sister   . Hypertension Brother   . Stroke Father   . Hypertension Father   . Colon cancer Maternal Grandmother   . Diabetes Maternal Grandmother     Social history:  reports that he has never smoked. He has never used smokeless tobacco. He reports that he drinks alcohol. He reports that he does not use illicit drugs.   No Known Allergies  Medications:  Prior to Admission medications   Medication Sig Start Date End Date Taking? Authorizing Provider  amLODipine (NORVASC) 10 MG tablet Take 1 tablet (10 mg total) by mouth daily. 02/01/14  Yes Kristen N Ward, DO  cloNIDine (CATAPRES) 0.1 MG tablet Take 1 tablet (0.1 mg total) by mouth 3 (three) times daily.  07/27/14  Yes Estela Leonie Green, MD  folic acid (FOLVITE) 1 MG tablet Take 1 tablet (1 mg total) by mouth daily. 07/27/14  Yes Estela Leonie Green, MD  levETIRAcetam (KEPPRA) 500 MG tablet Take 1 tablet (500 mg total) by mouth 2 (two) times daily. 07/27/14  Yes Erline Hau, MD  Multiple Vitamin (MULTIVITAMIN WITH MINERALS) TABS tablet Take 1 tablet by mouth daily. 07/27/14  Yes Erline Hau, MD  thiamine 100 MG tablet Take 1 tablet (100 mg total) by mouth daily. 07/27/14  Yes Estela Leonie Green, MD  insulin detemir (LEVEMIR) 100 UNIT/ML injection Inject 0.4 mLs (40 Units total) into the skin 2 (two) times daily. Patient not taking: Reported on 11/24/2014 07/27/14   Erline Hau, MD    ROS:  Out of a complete 14 system review of symptoms, the patient complains only of the following symptoms, and all other reviewed systems are negative.  Impotence History of seizures  Blood pressure 140/89, pulse 88, height 5\' 4"  (1.626 m), weight 195 lb 8 oz (88.678 kg).  Physical Exam  General: The patient is alert and cooperative at the time of the examination.  Skin: No significant peripheral edema is noted.   Neurologic Exam  Mental status: The patient is alert and oriented x 3 at the time of  the examination. The patient has apparent normal recent and remote memory, with an apparently normal attention span and concentration ability.   Cranial nerves: Facial symmetry is present. Speech is normal, no aphasia or dysarthria is noted. Extraocular movements are full. Visual fields are full.  Motor: The patient has good strength in all 4 extremities.  Sensory examination: Soft touch sensation is symmetric on the face, arms, and legs.  Coordination: The patient has good finger-nose-finger and heel-to-shin bilaterally.  Gait and station: The patient has a normal gait. Tandem gait is normal. Romberg is negative. No drift is seen.  Reflexes: Deep  tendon reflexes are symmetric.   MRI brain 09/04/14:  IMPRESSION: 1. Stable parasagittal right frontal calcified mass with smaller component on the left, up to 34 mm in diameter. As reasoned above, calcification has a gyral appearance and the possibility of posttraumatic/dystrophic calcification is raised. There is however prominent surrounding signal abnormality and peripheral enhancement, and calcified parenchymal neoplasm (especially oligodendroglioma, less likely a meningioma given intra-axial appearance) remain differential possibilities. Is there any chance of outside head CT for comparison? 2. 3 mm focus of enhancement in the left subinsular white matter is stable and indeterminate. Newly seen 2 mm focus of enhancement in the right midbrain. These could reflect very early metastases and require follow-up in this patient with history of rectal cancer. 3. Bifrontal gliosis correlating with history of traumatic brain injury. Extensive corpus callosum atrophy (well beyond level of calcified mass)  * MRI scan images were reviewed online. I agree with the written report.    Assessment/Plan:  1. History of seizures  2. Parafalcine mass  The patient is doing well on the [place]. The patient will be kept on the Keppra dosing at 500 mg twice daily, a prescription was called in. The patient was given Viagra for the impotence. He will follow-up in 6 months, sooner if needed.  Jill Alexanders MD 11/24/2014 12:59 PM  [facility] [address] [place], [place] [address]  Phone [phone] Fax [phone]

## 2015-01-04 DIAGNOSIS — Z0271 Encounter for disability determination: Secondary | ICD-10-CM

## 2015-03-22 ENCOUNTER — Encounter: Payer: Self-pay | Admitting: Adult Health

## 2015-04-30 ENCOUNTER — Telehealth: Payer: Self-pay | Admitting: Internal Medicine

## 2015-04-30 NOTE — Telephone Encounter (Signed)
Spoke with the pt  He is asking about taking testosterone shots with having been recently dxed with DM  I advised needs to call PCP  Never seen here before I verified name and DOB  Nothing further needed

## 2015-05-24 ENCOUNTER — Ambulatory Visit: Payer: BLUE CROSS/BLUE SHIELD | Admitting: Adult Health

## 2016-01-04 ENCOUNTER — Telehealth: Payer: Self-pay | Admitting: Oncology

## 2016-01-04 NOTE — Telephone Encounter (Signed)
FAXED RECORDS TO DR. SANDERS OFFICE PER PT REQUEST

## 2016-01-14 DIAGNOSIS — I639 Cerebral infarction, unspecified: Secondary | ICD-10-CM

## 2016-01-14 HISTORY — DX: Cerebral infarction, unspecified: I63.9

## 2016-04-08 ENCOUNTER — Ambulatory Visit (INDEPENDENT_AMBULATORY_CARE_PROVIDER_SITE_OTHER): Payer: BC Managed Care – PPO | Admitting: Neurology

## 2016-04-08 ENCOUNTER — Encounter: Payer: Self-pay | Admitting: Neurology

## 2016-04-08 VITALS — BP 160/103 | HR 97 | Resp 20 | Ht 64.0 in | Wt 213.0 lb

## 2016-04-08 DIAGNOSIS — R569 Unspecified convulsions: Secondary | ICD-10-CM | POA: Diagnosis not present

## 2016-04-08 DIAGNOSIS — R519 Headache, unspecified: Secondary | ICD-10-CM

## 2016-04-08 DIAGNOSIS — G939 Disorder of brain, unspecified: Secondary | ICD-10-CM

## 2016-04-08 DIAGNOSIS — R51 Headache: Secondary | ICD-10-CM | POA: Diagnosis not present

## 2016-04-08 DIAGNOSIS — R351 Nocturia: Secondary | ICD-10-CM

## 2016-04-08 DIAGNOSIS — E669 Obesity, unspecified: Secondary | ICD-10-CM

## 2016-04-08 DIAGNOSIS — G9389 Other specified disorders of brain: Secondary | ICD-10-CM

## 2016-04-08 DIAGNOSIS — R400 Somnolence: Secondary | ICD-10-CM

## 2016-04-08 NOTE — Patient Instructions (Addendum)
Based on your symptoms and your exam I believe you are at risk for obstructive sleep apnea or OSA, and I think we should proceed with a sleep study to determine whether you do or do not have OSA and how severe it is. If you have more than mild OSA, I want you to consider treatment with CPAP. Please remember, the risks and ramifications of moderate to severe obstructive sleep apnea or OSA are: Cardiovascular disease, including congestive heart failure, stroke, difficult to control hypertension, arrhythmias, and even type 2 diabetes has been linked to untreated OSA. Sleep apnea causes disruption of sleep and sleep deprivation in most cases, which, in turn, can cause recurrent headaches, problems with memory, mood, concentration, focus, and vigilance. Most people with untreated sleep apnea report excessive daytime sleepiness, which can affect their ability to drive. Please do not drive if you feel sleepy.   I will likely see you back after your sleep study to go over the test results and where to go from there. We will call you after your sleep study to advise about the results (most likely, you will hear from Beverlee Nims, my nurse) and to set up an appointment at the time, as necessary.    Our sleep lab administrative assistant, Arrie Aran will meet with you or call you to schedule your sleep study. If you don't hear back from her by next week please feel free to call her at [phone]. This is her direct line and please leave a message with your phone number to call back if you get the voicemail box. She will call back as soon as possible.   Please make your appointments with Drs. Cabbell and Willis as planned.

## 2016-04-08 NOTE — Progress Notes (Signed)
Subjective:    Patient ID: [name] is a 51 y.o. male.  HPI     Star Age, MD, PhD [facility] [address], [address] [place], [place] [address]  Dear Doreene Burke,   I saw your patient, [name], upon your kind request in my neurologic clinic today for initial consultation of his sleep disorder, in particular, concern for underlying obstructive sleep apnea in the context of daytime somnolence and obesity. The patient is unaccompanied today. As you know, [name] is a 51 year old right-handed gentleman with an underlying medical history of seizure disorder, for which he is followed by Dr. Jannifer Franklin in our office, history of parafalcine brain mass for which he is also followed by neurosurgery, hypertension, diabetes, and obesity, who reports excessive daytime somnolence. I reviewed your office note from 03/19/2016, which you kindly included. His Epworth sleepiness score is 15 out of 24, fatigue score is 39 out of 63. Saw Dr. Christella Noa in 2016, last brain MRI from 09/04/14 showed: IMPRESSION: 1. Stable parasagittal right frontal calcified mass with smaller component on the left, up to 34 mm in diameter. As reasoned above, calcification has a gyral appearance and the possibility of posttraumatic/dystrophic calcification is raised. There is however prominent surrounding signal abnormality and peripheral enhancement, and calcified parenchymal neoplasm (especially oligodendroglioma, less likely a meningioma given intra-axial appearance) remain differential possibilities. Is there any chance of outside head CT for comparison? 2. 3 mm focus of enhancement in the left subinsular white matter is stable and indeterminate. Newly seen 2 mm focus of enhancement in the right midbrain. These could reflect very early metastases and require follow-up in this patient with history of rectal cancer. 3. Bifrontal gliosis correlating with history of traumatic  brain injury. Extensive corpus callosum atrophy (well beyond level of calcified mass)   He did not  go back to see Dr. Cyndy Freeze as I understand and he did not make a follow-up appointment with Dr. Jannifer Franklin. He has not had a brain MRI last year from what I can see. He reports a one-year history of gradual onset of daytime somnolence. He is not sure if he snores, wife has not complained and he is not aware of his own snoring. He takes his blood pressure medications at night. He is no longer on clonidine. He is also no longer on Keppra, he is not fully clear whether he stopped it on his own or was told to stop it. He denies restless leg symptoms. HEENT denies a family history of OSA. He does wake up with headaches sometimes. He has nocturia about twice per hour tonight. He is a nonsmoker, drinks alcohol occasionally, maybe once a month or so, drinks caffeine in the form of coffee, a couple of times per week and occasional tea but not necessarily daily caffeine. His bedtime is around 8:30 or 9, wakeup time is 5:15. He does not typically take a nap. He feels that his weight has been stable. In our records, in November 2016 his weight was 195.5 pounds, today 213 pounds. He reports right knee pain and swelling. He received a shot yesterday in our office. He was also given a new medication to take 3 times a day, he is not sure, he believes this is an antibiotic. He works as a Patent examiner. Blood pressure is elevated today and he reports taking all his blood pressure medication last night. He denies any chest pain, shortness of breath, headache, blurry vision. He has not had an  eye check in over 2 years, he does have corrective eyeglasses.  His Past Medical History Is Significant For: Past Medical History:  Diagnosis Date  . Colon cancer ([place])    rectal  . Diabetes mellitus without complication ([place])   . Hypertension   . Seizure [facility])     His Past Surgical History Is Significant For: Past Surgical  History:  Procedure Laterality Date  . APPENDECTOMY    . JOINT REPLACEMENT Left 2004   Left hip  . TOTAL HIP ARTHROPLASTY      His Family History Is Significant For: Family History  Problem Relation Age of Onset  . Hypertension Mother   . Heart disease Mother   . Hyperlipidemia Mother   . Heart failure Mother   . Hypertension Sister   . Hypertension Brother   . Stroke Father   . Hypertension Father   . Colon cancer Maternal Grandmother   . Diabetes Maternal Grandmother     His Social History Is Significant For: Social History   Social History  . Marital status: Married    Spouse name: N/A  . Number of children: 1  . 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 Smoking status: Never Smoker  . Smokeless tobacco: Never Used  . Alcohol use 0.0 oz/week     Comment: socially  . Drug use: No  . Sexual activity: Not Asked   Other Topics Concern  . None   Social History Narrative   Patient drinks caffeine a couple times a week.   Patient is right handed.    His Allergies Are:  No Known Allergies:   His Current Medications Are:  Outpatient Encounter Prescriptions as of 04/08/2016  Medication Sig  . amLODipine (NORVASC) 10 MG tablet Take 1 tablet (10 mg total) by mouth daily.  . cloNIDine (CATAPRES) 0.1 MG tablet Take 1 tablet (0.1 mg total) by mouth 3 (three) times daily.  . folic acid (FOLVITE) 1 MG tablet Take 1 tablet (1 mg total) by mouth daily.  Marland Kitchen JANUMET XR [phone] MG TB24 Take 1 tablet by mouth daily.  . Multiple Vitamin (MULTIVITAMIN WITH MINERALS) TABS tablet Take 1 tablet by mouth daily.  . valsartan (DIOVAN) 160 MG tablet Take 160 mg by mouth daily.  . [DISCONTINUED] insulin detemir (LEVEMIR) 100 UNIT/ML injection Inject 0.4 mLs (40 Units total) into the skin 2 (two) times daily. (Patient not taking: Reported on 11/24/2014)  . [DISCONTINUED] levETIRAcetam (KEPPRA) 500 MG tablet Take 1 tablet (500 mg  total) by mouth 2 (two) times daily.  . [DISCONTINUED] sildenafil (VIAGRA) 100 MG tablet Take 1 tablet (100 mg total) by mouth daily as needed for erectile dysfunction.  . [DISCONTINUED] thiamine 100 MG tablet Take 1 tablet (100 mg total) by mouth daily.   No facility-administered encounter medications on file as of 04/08/2016.   :  Review of Systems:  Out of a complete 14 point review of systems, all are reviewed and negative with the exception of these symptoms as listed below: Review of Systems  Neurological:       Pt presents today to discuss his sleep. Pt does not endorse snoring and has never had a sleep study, but does endorse sleepiness.   Epworth Sleepiness Scale 0= would never doze 1= slight chance of dozing 2= moderate chance of dozing 3= high chance of dozing  Sitting and reading: 2 Watching TV: 3 Sitting inactive in a public place (  ex. Theater or meeting): 2 As a passenger in a car for an hour without a break: 2 Lying down to rest in the afternoon: 2 Sitting and talking to someone:1 Sitting quietly after lunch (no alcohol): 2 In a car, while stopped in traffic: 1 Total: 15     Objective:  Neurologic Exam  Physical Exam Physical Examination:   Vitals:   04/08/16 0956  BP: (!) 188/101  Pulse: (!) 104  Resp: 20   His BP is up today, denies Sx of CP/SOB/blurry vision/HA. Rechecked blood pressure was 160/103.  General Examination: The patient is a very pleasant 51 y.o. male in no acute distress. He appears well-developed and well-nourished and well groomed.   HEENT: Normocephalic, atraumatic, pupils are equal, round and reactive to light and accommodation. Funduscopic exam is normal with sharp disc margins noted. Extraocular tracking is good without limitation to gaze excursion or nystagmus noted. Normal smooth pursuit is noted. Hearing is grossly intact. Tympanic membranes are clear bilaterally. Face is symmetric with normal facial animation and normal facial  sensation. Speech is clear with no dysarthria noted. There is no hypophonia. There is no lip, neck/head, jaw or voice tremor. Neck is supple with full range of passive and active motion. There are no carotid bruits on auscultation. Oropharynx exam reveals: mild mouth dryness, adequate dental hygiene and moderate airway crowding, due to smaller airway, longer tongue, floppy uvula, tonsils in place. Mallampati is class II. Tongue protrudes centrally and palate elevates symmetrically. Tonsils are 1+ in size. Neck size is 17 5/8 inches. He has a nearly absent overbite.   Chest: Clear to auscultation without wheezing, rhonchi or crackles noted.  Heart: S1+S2+0, regular and normal without murmurs, rubs or gallops noted.   Abdomen: Soft, non-tender and non-distended with normal bowel sounds appreciated on auscultation.  Extremities: There is no pitting edema in the distal lower extremities bilaterally.   Skin: Warm and dry without trophic changes noted.  Musculoskeletal: exam reveals no obvious joint deformities, tenderness or joint swelling or erythema, with the exception of right knee swelling and tenderness, no redness is reported, decrease of range of motion is noted in the right knee only.   Neurologically:  Mental status: The patient is awake, alert and oriented in all 4 spheres. His immediate and remote memory, attention, language skills and fund of knowledge are appropriate. There is no evidence of aphasia, agnosia, apraxia or anomia. Speech is clear with normal prosody and enunciation. Thought process is linear. Mood is normal and affect is normal.  Cranial nerves II - XII are as described above under HEENT exam. In addition: shoulder shrug is normal with equal shoulder height noted. Motor exam: Normal bulk, strength and tone is noted. There is no drift, tremor or rebound. Romberg is negative. Reflexes are 2+ throughout. Fine motor skills and coordination: intact except for limitation in the right  lower extremity due to knee swelling and tenderness. Cerebellar testing: No dysmetria or intention tremor.  Sensory exam: intact to light touch in the upper and lower extremities.  Gait, station and balance: He stands with difficulty. He walks with a cane, he has a limp on the right. Tandem walk is not possible secondary to knee pain and swelling.   Assessment and Plan:  In summary, [name] is a very pleasant 51 y.o.-year old male with an underlying medical history of seizure disorder, for which he is followed by Dr. Jannifer Franklin in our office, history of parafalcine brain mass for which he is also  followed by neurosurgery, hypertension, diabetes, and obesity, whose history and physical exam are concerning for obstructive sleep apnea (OSA). I had a long chat with the patient about my findings and the diagnosis of OSA, its prognosis and treatment options. We talked about medical treatments, surgical interventions and non-pharmacological approaches. I explained in particular the risks and ramifications of untreated moderate to severe OSA, especially with respect to developing cardiovascular disease down the Road, including congestive heart failure, difficult to treat hypertension, cardiac arrhythmias, or stroke. Even type 2 diabetes has, in part, been linked to untreated OSA. Symptoms of untreated OSA include daytime sleepiness, memory problems, mood irritability and mood disorder such as depression and anxiety, lack of energy, as well as recurrent headaches, especially morning headaches. We talked about trying to maintain a healthy lifestyle in general, as well as the importance of weight control. I encouraged the patient to eat healthy, exercise daily and keep well hydrated, to keep a scheduled bedtime and wake time routine, to not skip any meals and eat healthy snacks in between meals. I advised the patient not to drive when feeling sleepy. I recommended the following at this time: sleep study with  potential positive airway pressure titration. (We will score hypopneas at 3%).  He is advised to make a follow-up appointment with his neurosurgeon, Dr. Cyndy Freeze, he may need a repeat brain MRI. Also, he missed an appointment with Dr. Jannifer Franklin last year and he is encouraged to make a follow-up appointment. He is off of Keppra, unclear if he just ran out or not, he is not fully clear about it. He has elevated blood pressure values today, denies any symptoms but is advised to make a follow-up appointment with your office for recheck, has no longer been on clonidine for the past year. He does indicate compliance with his blood pressure medications which he takes at night only.   I explained the sleep test procedure to the patient and also outlined possible surgical and non-surgical treatment options of OSA, including the use of a custom-made dental device (which would require a referral to a specialist dentist or oral surgeon), upper airway surgical options, such as pillar implants, radiofrequency surgery, tongue base surgery, and UPPP (which would involve a referral to an ENT surgeon). Rarely, jaw surgery such as mandibular advancement may be considered.  I also explained the CPAP treatment option to the patient, who indicated that he would be willing to try CPAP if the need arises. I explained the importance of being compliant with PAP treatment, not only for insurance purposes but primarily to improve His symptoms, and for the patient's long term health benefit, including to reduce His cardiovascular risks. I answered all his questions today and the patient was in agreement. I would like to see him back after the sleep study is completed and encouraged him to call with any interim questions, concerns, problems or updates.   Thank you very much for allowing me to participate in the care of this nice patient. If I can be of any further assistance to you please do not hesitate to call me at  [phone].  Sincerely,   Star Age, MD, PhD

## 2016-04-18 ENCOUNTER — Other Ambulatory Visit: Payer: Self-pay | Admitting: Internal Medicine

## 2016-04-18 ENCOUNTER — Ambulatory Visit
Admission: RE | Admit: 2016-04-18 | Discharge: 2016-04-18 | Disposition: A | Discharge status: Home / Self Care | Payer: BC Managed Care – PPO | Source: Ambulatory Visit | Attending: Internal Medicine | Admitting: Internal Medicine

## 2016-04-18 DIAGNOSIS — M25561 Pain in right knee: Secondary | ICD-10-CM

## 2016-04-21 ENCOUNTER — Encounter (HOSPITAL_COMMUNITY): Payer: Self-pay | Admitting: Emergency Medicine

## 2016-04-21 ENCOUNTER — Ambulatory Visit (HOSPITAL_COMMUNITY)
Admission: EM | Admit: 2016-04-21 | Discharge: 2016-04-21 | Disposition: A | Discharge status: Home / Self Care | Payer: BC Managed Care – PPO | Attending: Internal Medicine | Admitting: Internal Medicine

## 2016-04-21 DIAGNOSIS — M25561 Pain in right knee: Secondary | ICD-10-CM | POA: Diagnosis not present

## 2016-04-21 DIAGNOSIS — M25461 Effusion, right knee: Secondary | ICD-10-CM | POA: Diagnosis not present

## 2016-04-21 MED ORDER — MELOXICAM 15 MG PO TABS: 15.0000 mg | ORAL_TABLET | Freq: Every day | ORAL | 0 refills | Status: DC

## 2016-04-21 MED ORDER — HYDROCODONE-ACETAMINOPHEN 5-325 MG PO TABS: 1.0000 | ORAL_TABLET | ORAL | 0 refills | Status: DC | PRN

## 2016-04-21 NOTE — ED Provider Notes (Signed)
CSN: [id number]     Arrival date & time 04/21/16  1538 History   First MD Initiated Contact with Patient 04/21/16 1730     Chief Complaint  Patient presents with  . 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 States he woke up on Saturday and had difficulty standing on his right leg. The pain has increased. Denies any known injury, no twisting of the knee no fall or other trauma. Denies any known reason for knee pain. 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 He saw his PCP last week old his knee looked a little red and possibly infected and treated with an antibiotic. X-rays and lab work were obtained. X-ray reveals some mild degenerative changes and  knee effusion otherwise unremarkable. The lab result is not available. He is using a walker so he will not have to bear weight. He states he feels better flexed and hurts worse with extension.      Past Medical History:  Diagnosis Date  . Colon cancer ([place])    rectal  . Diabetes mellitus without complication ([place])   . Hypertension   . Seizure [facility])    Past Surgical History:  Procedure Laterality Date  . APPENDECTOMY    . JOINT REPLACEMENT Left 2004   Left hip  . TOTAL HIP ARTHROPLASTY     Family History  Problem Relation Age of Onset  . Hypertension Mother   . Heart disease Mother   . Hyperlipidemia Mother   . Heart failure Mother   . Hypertension Sister   . Hypertension Brother   . Stroke Father   . Hypertension Father   . Colon cancer Maternal Grandmother   . Diabetes Maternal Grandmother    Social History  Substance Use Topics  . Smoking status: Never Smoker  . Smokeless tobacco: Never Used  . Alcohol use 0.0 oz/week     Comment: socially    Review of Systems  Constitutional: Negative.   Respiratory: Negative.   Gastrointestinal: Negative.   Genitourinary: Negative.    Musculoskeletal: Positive for arthralgias and gait problem.       As per HPI  Skin: Negative.   Neurological: Negative for dizziness, weakness, numbness and headaches.  All other systems reviewed and are negative.   Allergies  Patient has no known allergies.  Home Medications   Prior to Admission medications   Medication Sig Start Date End Date Taking? Authorizing Provider  amLODipine (NORVASC) 10 MG tablet Take 1 tablet (10 mg total) by mouth daily. 02/01/14   Kristen N Ward, DO  cloNIDine (CATAPRES) 0.1 MG tablet Take 1 tablet (0.1 mg total) by mouth 3 (three) times daily. 07/27/14   Erline Hau, MD  folic acid (FOLVITE) 1 MG tablet Take 1 tablet (1 mg total) by mouth daily. 07/27/14   Erline Hau, MD  HYDROcodone-acetaminophen (NORCO/VICODIN) 5-325 MG tablet Take 1 tablet by mouth every 4 (four) hours as needed. 04/21/16   Janne Napoleon, NP  JANUMET XR [phone] MG TB24 Take 1 tablet by mouth daily. 03/16/16   Historical Provider, MD  meloxicam (MOBIC) 15 MG tablet Take 1 tablet (15 mg total) by mouth daily. 04/21/16   Janne Napoleon, NP  Multiple Vitamin (MULTIVITAMIN WITH MINERALS) TABS tablet Take 1 tablet by mouth daily. 07/27/14   Erline Hau, MD  valsartan (DIOVAN) 160 MG tablet Take 160 mg by mouth daily.  03/13/16   Historical Provider, MD   Meds Ordered and Administered this Visit  Medications - No data to display  BP (!) 180/108 (BP Location: Right Arm)   Pulse 97   Temp 98.5 F (36.9 C) (Oral)   Resp 16   SpO2 98%  No data found.   Physical Exam  Constitutional: He is oriented to person, place, and time. He appears well-developed and well-nourished.  HENT:  Head: Normocephalic and atraumatic.  Eyes: EOM are normal. Left eye exhibits no discharge.  Neck: Normal range of motion. Neck supple.  Musculoskeletal: He exhibits tenderness. He exhibits no deformity.  Left knee with mild generalized swelling. No erythema or other discoloration. The  most pain is over the medial aspect of the knee. Greatest over the distal femoral and proximal tibial condyles.  Neurological: He is alert and oriented to person, place, and time. No cranial nerve deficit.  Skin: Skin is warm and dry.  Psychiatric: He has a normal mood and affect.  Nursing note and vitals reviewed.   Urgent Care Course     Procedures (including critical care time)  Labs Review Labs Reviewed - No data to display  Imaging Review No results found.   Visual Acuity Review  Right Eye Distance:   Left Eye Distance:   Bilateral Distance:    Right Eye Near:   Left Eye Near:    Bilateral Near:         MDM   1. Acute pain of right knee   2. Effusion of right knee    Call the ortho tomorrow for appointment. Medications as directed. Continue the walker for weight bearing for now. Meds ordered this encounter  Medications  . meloxicam (MOBIC) 15 MG tablet    Sig: Take 1 tablet (15 mg total) by mouth daily.    Dispense:  14 tablet    Refill:  0    Order Specific Question:   Supervising Provider    Answer:   Sherlene Shams [[id number]]  . HYDROcodone-acetaminophen (NORCO/VICODIN) 5-325 MG tablet    Sig: Take 1 tablet by mouth every 4 (four) hours as needed.    Dispense:  15 tablet    Refill:  0    Order Specific Question:   Supervising Provider    Answer:   Sherlene Shams [[id number]]      Janne Napoleon, NP 04/21/16 1810

## 2016-04-21 NOTE — ED Triage Notes (Signed)
The patient presented to the [facility] with a complaint of right knee pain x 2 weeks. The patient denied any known injury.

## 2016-04-21 NOTE — Discharge Instructions (Signed)
Call the ortho tomorrow for appointment. Medications as directed. Continue the walker for weight bearing for now.

## 2016-05-02 ENCOUNTER — Ambulatory Visit (INDEPENDENT_AMBULATORY_CARE_PROVIDER_SITE_OTHER): Payer: BC Managed Care – PPO | Admitting: Neurology

## 2016-05-02 DIAGNOSIS — G4733 Obstructive sleep apnea (adult) (pediatric): Secondary | ICD-10-CM

## 2016-05-07 NOTE — Addendum Note (Signed)
Addended by: Star Age on: 05/07/2016 08:22 AM   Modules accepted: Orders

## 2016-05-07 NOTE — Progress Notes (Signed)
Diana:  Patient referred by Minette Brine, NP, seen by me on 04/08/16, split study on 05/02/16. Please call and notify patient that the recent sleep study confirmed the diagnosis of severe OSA. He did very well with CPAP during the study with significant improvement of the respiratory events. Therefore, I would like start the patient on CPAP therapy at home by prescribing a machine for home use. I placed the order in the chart. The patient will need a follow up appointment with me in 10 weeks post set up that has to be scheduled; please go ahead and schedule while you have the patient on the phone and make sure patient understands the importance of keeping this window for the FU appointment, as it is often an insurance requirement and failing to adhere to this may result in losing coverage for sleep apnea treatment.  Please re-enforce the importance of compliance with treatment and the need for [place] to monitor compliance data - again an insurance requirement and good feedback for the patient as far as how they are doing.  Also remind patient, that any upcoming CPAP machine or mask issues, should be first addressed with the DME company. Please ask if patient has a preference regarding DME company.  Please arrange for CPAP set up at home through a DME company of patient's choice - once you have spoken to the patient - and faxed/routed report to PCP and referring MD (if other than PCP), you can close this encounter, thanks,   Star Age, MD, PhD [facility] ([place])

## 2016-05-07 NOTE — Procedures (Signed)
PATIENT'S NAME:  [name], Cure DOB:      [birth year]      MR#:    [id number]     DATE OF RECORDING: 05/02/2016 REFERRING M.D.:  Minette Brine, FNP Study Performed:  Split-Night Titration Study HISTORY: 51 year old man with a history of seizure disorder (followed by Dr. Jannifer Franklin), history of parafalcine brain mass (followed by neurosurgery), hypertension, diabetes, and obesity, who reports excessive daytime somnolence. The patient endorsed the Epworth Sleepiness Scale at 15 points. The patient's weight 213 pounds with a height of 64 (inches), resulting in a BMI of 36.5 kg/m2. The patient's neck circumference measured 17.6 inches.  CURRENT MEDICATIONS: Norvasc, Catapres, Folvite, Janumet, Multivitamin, Diovan   PROCEDURE:  This is a multichannel digital polysomnogram utilizing the Somnostar 11.2 system.  Electrodes and sensors were applied and monitored per AASM Specifications.   EEG, EOG, Chin and Limb EMG, were sampled at 200 Hz.  ECG, Snore and Nasal Pressure, Thermal Airflow, Respiratory Effort, CPAP Flow and Pressure, Oximetry was sampled at 50 Hz. Digital video and audio were recorded.      BASELINE STUDY WITHOUT CPAP RESULTS:  Lights Out was at 22:26 and Lights On at 05:03 for the night.  Total recording time (TRT) was 139, with a total sleep time (TST) of 120 minutes.   The patient's sleep latency was 12.5 minutes, which is normal.  REM latency was 62.5 minutes, which is mildly reduced.  The sleep efficiency was 86.3 %.    SLEEP ARCHITECTURE: WASO (Wake after sleep onset) was 7 minutes, Stage N1 was 5 minutes, Stage N2 was 89 minutes, Stage N3 was 0 minutes and Stage R (REM sleep) was 26 minutes.  The percentages were Stage N1 4.2%, Stage N2 74.2%, Stage N3 was absen and Stage R (REM sleep) 21.7%.   The arousals were noted as: 65 were spontaneous, 0 were associated with PLMs, 138 were associated with respiratory events.   Audio and video analysis did not show any abnormal or unusual  movements, behaviors, phonations or vocalizations. The patient took 1 bathroom break for the night. Mild to moderate snoring was noted.  EKG was in keeping with normal sinus rhythm (NSR).   RESPIRATORY ANALYSIS:  There were a total of 197 respiratory events:  89 obstructive apneas, 0 central apneas and 0 mixed apneas with a total of 89 apneas and an apnea index (AI) of 44.5. There were 108 hypopneas with a hypopnea index of 54. The patient also had 0 respiratory event related arousals (RERAs).  Snoring was noted.     The total APNEA/HYPOPNEA INDEX (AHI) was 98.5 /hour and the total RESPIRATORY DISTURBANCE INDEX was 98.5 /hour.  15 events occurred in REM sleep and 188 events in NREM. The REM AHI was 34.6, /hour versus a non-REM AHI of 116.2 /hour. The patient spent 187.5 minutes sleep time in the supine position 168 minutes in non-supine. The supine AHI was 98.5 /hour versus a non-supine AHI of 0.0 /hour.  OXYGEN SATURATION & C02:  The wake baseline 02 saturation was 94%, with the lowest being 83%. Time spent below 89% saturation equaled 45 minutes.  PERIODIC LIMB MOVEMENTS: The patient had a total of 0 Periodic Limb Movements.  The Periodic Limb Movement (PLM) index was 0 /hour and the PLM Arousal index was 0 /hour.  TITRATION STUDY WITH CPAP RESULTS:   The patient was fitted with a small Simplus FFM. CPAP was initiated at 5 cmH20 with heated humidity per AASM split night standards and pressure was advanced to  10 cmH20 because of hypopneas, apneas and desaturations. At a PAP pressure of 10 cmH20, there was a reduction of the AHI to 1/hour, O2 nadir of 92%, non-supine REM sleep achieved.   Total recording time (TRT) was 258 minutes, with a total sleep time (TST) of 235.5 minutes. The patient's sleep latency was 20 minutes. REM latency was 59 minutes.  The sleep efficiency was 91.3 %.    SLEEP ARCHITECTURE: Wake after sleep was 12.5 minutes, Stage N1 5.5 minutes, Stage N2 179 minutes, Stage N3 3  minutes and Stage R (REM sleep) 48 minutes. The percentages were: Stage N1 2.3%, Stage N2 76.%, Stage N3 1.3% and Stage R (REM sleep) 20.4%.   The arousals were noted as: 51 were spontaneous, 0 were associated with PLMs, 12 were associated with respiratory events.  RESPIRATORY ANALYSIS:  There were a total of 56 respiratory events: 2 obstructive apneas, 0 central apneas and 0 mixed apneas with a total of 2 apneas and an apnea index (AI) of .5. There were 54 hypopneas with a hypopnea index of 13.8 /hour. The patient also had 8 respiratory event related arousals (RERAs).      The total APNEA/HYPOPNEA INDEX  (AHI) was 14.3 /hour and the total RESPIRATORY DISTURBANCE INDEX was 16.3 /hour.  3 events occurred in REM sleep and 53 events in NREM. The REM AHI was 3.8 /hour versus a non-REM AHI of 17. /hour. REM sleep was achieved on a pressure of  cm/h2o (AHI was  .) The patient spent 29% of total sleep time in the supine position. The supine AHI was 44.5 /hour, versus a non-supine AHI of 2.2/hour.  OXYGEN SATURATION & C02:  The wake baseline 02 saturation was 94%, with the lowest being 88%. Time spent below 89% saturation equaled 0 minutes.  PERIODIC LIMB MOVEMENTS: The patient had a total of 0 Periodic Limb Movements. The Periodic Limb Movement (PLM) index was 0 /hour and the PLM Arousal index was 0 /hour.   Post-study, the patient indicated that sleep was better than usual.    POLYSOMNOGRAPHY IMPRESSION :   1. Obstructive Sleep Apnea (OSA)   RECOMMENDATIONS:  1. This patient has severe obstructive sleep apnea and responded well on CPAP therapy. I will, therefore, start the patient on home CPAP treatment at a pressure of 10 cm via small FFM, with heated humidity. The patient should be reminded to be fully compliant with PAP therapy to improve sleep related symptoms and decrease long term cardiovascular risks. Please note that untreated obstructive sleep apnea carries additional perioperative  morbidity. Patients with significant obstructive sleep apnea should receive perioperative PAP therapy and the surgeons and particularly the anesthesiologist should be informed of the diagnosis and the severity of the sleep disordered breathing. 2. The patient should be cautioned not to drive, work at heights, or operate dangerous or heavy equipment when tired or sleepy. Review and reiteration of good sleep hygiene measures should be pursued with any patient. 3. The patient will be seen in follow-up by Dr. Rexene Alberts at [facility] for discussion of the test results and further management strategies. The referring provider will be notified of the test results.  I certify that I have reviewed the entire raw data recording prior to the issuance of this report in accordance with the Standards of Accreditation of the American Academy of Sleep Medicine (AASM)   Star Age, MD, PhD Diplomat, American Board of Psychiatry and Neurology (Neurology and Sleep Medicine)

## 2016-05-08 ENCOUNTER — Telehealth: Payer: Self-pay

## 2016-05-08 NOTE — Telephone Encounter (Signed)
I called pt. I advised pt that Dr. Rexene Alberts reviewed their sleep study results and found that pt has severe osa but that he did well with the cpap which improved his respiratory events. Dr. Rexene Alberts recommends that pt start a cpap at home. I reviewed PAP compliance expectations with the pt. Pt is agreeable to starting a CPAP. I advised pt that an order will be sent to a DME, [facility], and [facility] will call the pt within about one week after they file with the pt's insurance. [facility] will show the pt how to use the machine, fit for masks, and troubleshoot the CPAP if needed. A follow up appt was made for insurance purposes with Dr. Rexene Alberts on July 10th, 2018 at 10:30am. Pt verbalized understanding to arrive 15 minutes early and bring their CPAP. A letter with all of this information in it will be mailed to the pt as a reminder. I verified with the pt that the address we have on file is correct. Pt verbalized understanding of results. Pt had no questions at this time but was encouraged to call back if questions arise.

## 2016-05-08 NOTE — Telephone Encounter (Signed)
-----   Message from Star Age, MD sent at 05/07/2016  8:22 AM EDT ----- Beverlee Nims:  Patient referred by Minette Brine, NP, seen by me on 04/08/16, split study on 05/02/16. Please call and notify patient that the recent sleep study confirmed the diagnosis of severe OSA. He did very well with CPAP during the study with significant improvement of the respiratory events. Therefore, I would like start the patient on CPAP therapy at home by prescribing a machine for home use. I placed the order in the chart. The patient will need a follow up appointment with me in 10 weeks post set up that has to be scheduled; please go ahead and schedule while you have the patient on the phone and make sure patient understands the importance of keeping this window for the FU appointment, as it is often an insurance requirement and failing to adhere to this may result in losing coverage for sleep apnea treatment.  Please re-enforce the importance of compliance with treatment and the need for [place] to monitor compliance data - again an insurance requirement and good feedback for the patient as far as how they are doing.  Also remind patient, that any upcoming CPAP machine or mask issues, should be first addressed with the DME company. Please ask if patient has a preference regarding DME company.  Please arrange for CPAP set up at home through a DME company of patient's choice - once you have spoken to the patient - and faxed/routed report to PCP and referring MD (if other than PCP), you can close this encounter, thanks,   Star Age, MD, PhD [facility] ([place])

## 2016-07-07 ENCOUNTER — Encounter: Payer: Self-pay | Admitting: Neurology

## 2016-07-07 ENCOUNTER — Ambulatory Visit (INDEPENDENT_AMBULATORY_CARE_PROVIDER_SITE_OTHER): Payer: BC Managed Care – PPO | Admitting: Neurology

## 2016-07-07 VITALS — BP 188/117 | HR 87 | Ht 64.0 in | Wt 219.0 lb

## 2016-07-07 DIAGNOSIS — R569 Unspecified convulsions: Secondary | ICD-10-CM | POA: Diagnosis not present

## 2016-07-07 NOTE — Progress Notes (Signed)
Reason for visit: Seizures  [name] is an 51 y.o. male  History of present illness:  [name] is a 51 year old right-handed black male with a history of a meningioma in parafalcine area frontally with ossification and some edema of the brain in the right greater than left frontal lobes. The patient has been on Keppra, but he claims that he went off of [place] in April 2017. He has not had any seizures since that time. His initial seizure was on 07/22/2014. The patient claims that Dr. Christella Noa is still following him for the meningioma, but the patient has not had scans since 2016. The patient is operating a motor vehicle, he has not had any problems with this. He returns to this office for an evaluation.  Past Medical History:  Diagnosis Date  . Colon cancer ([place])    rectal  . Diabetes mellitus without complication ([place])   . Hypertension   . Seizure [facility])     Past Surgical History:  Procedure Laterality Date  . APPENDECTOMY    . JOINT REPLACEMENT Left 2004   Left hip  . TOTAL HIP ARTHROPLASTY      Family History  Problem Relation Age of Onset  . Hypertension Mother   . Heart disease Mother   . Hyperlipidemia Mother   . Heart failure Mother   . Hypertension Sister   . Hypertension Brother   . Stroke Father   . Hypertension Father   . Colon cancer Maternal Grandmother   . Diabetes Maternal Grandmother     Social history:  reports that he has never smoked. He has never used smokeless tobacco. He reports that he drinks alcohol. He reports that he does not use drugs.   No Known Allergies  Medications:  Prior to Admission medications   Medication Sig Start Date End Date Taking? Authorizing Provider  amLODipine (NORVASC) 10 MG tablet Take 1 tablet (10 mg total) by mouth daily. 02/01/14  Yes Ward, Delice Bison, DO  cloNIDine (CATAPRES) 0.1 MG tablet Take 1 tablet (0.1 mg total) by mouth 3 (three) times daily. 07/27/14  Yes Isaac Bliss, Rayford Halsted, MD  folic acid  (FOLVITE) 1 MG tablet Take 1 tablet (1 mg total) by mouth daily. 07/27/14  Yes Isaac Bliss, Rayford Halsted, MD  HYDROcodone-acetaminophen (NORCO/VICODIN) 5-325 MG tablet Take 1 tablet by mouth every 4 (four) hours as needed. 04/21/16  Yes Mabe, Shanon Brow, NP  JANUMET XR [id number] MG TB24 Take 1 tablet by mouth daily. 03/16/16  Yes [provider]  meloxicam (MOBIC) 15 MG tablet Take 1 tablet (15 mg total) by mouth daily. 04/21/16  Yes Mabe, Shanon Brow, NP  Multiple Vitamin (MULTIVITAMIN WITH MINERALS) TABS tablet Take 1 tablet by mouth daily. 07/27/14  Yes Isaac Bliss, Rayford Halsted, MD  valsartan (DIOVAN) 160 MG tablet Take 160 mg by mouth daily. 03/13/16  Yes [provider]    ROS:  Out of a complete 14 system review of symptoms, the patient complains only of the following symptoms, and all other reviewed systems are negative.  History of seizures  Blood pressure (!) 188/117, pulse 87, height 5\' 4"  (1.626 m), weight 219 lb (99.3 kg).  Physical Exam  General: The patient is alert and cooperative at the time of the examination. The patient is moderately obese.  Skin: No significant peripheral edema is noted.   Neurologic Exam  Mental status: The patient is alert and oriented x 3 at the time of the examination. The patient has apparent normal  recent and remote memory, with an apparently normal attention span and concentration ability.   Cranial nerves: Facial symmetry is present. Speech is normal, no aphasia or dysarthria is noted. Extraocular movements are full. Visual fields are full.  Motor: The patient has good strength in all 4 extremities.  Sensory examination: Soft touch sensation is symmetric on the face, arms, and legs.  Coordination: The patient has good finger-nose-finger and heel-to-shin bilaterally.  Gait and station: The patient has a normal gait. Tandem gait is normal. Romberg is negative. No drift is seen.  Reflexes: Deep tendon reflexes are symmetric.   MRI  brain 09/04/14:  IMPRESSION: 1. Stable parasagittal right frontal calcified mass with smaller component on the left, up to 34 mm in diameter. As reasoned above, calcification has a gyral appearance and the possibility of posttraumatic/dystrophic calcification is raised. There is however prominent surrounding signal abnormality and peripheral enhancement, and calcified parenchymal neoplasm (especially oligodendroglioma, less likely a meningioma given intra-axial appearance) remain differential possibilities. Is there any chance of outside head CT for comparison? 2. 3 mm focus of enhancement in the left subinsular white matter is stable and indeterminate. Newly seen 2 mm focus of enhancement in the right midbrain. These could reflect very early metastases and require follow-up in this patient with history of rectal cancer. 3. Bifrontal gliosis correlating with history of traumatic brain injury. Extensive corpus callosum atrophy (well beyond level of calcified mass)  * MRI scan images were reviewed online. I agree with the written report.    Assessment/Plan:  1. History of meningioma  2. History of seizure  The patient will be sent for an EEG study, if this is abnormal, we will reinstitute Keppra. The patient will follow-up through this office if needed otherwise. The patient may need a follow-up MRI of the brain in the near future.  Jill Alexanders MD 07/07/2016 2:47 PM  [facility] [address] [place], [place] [address]  Phone [phone] Fax [phone]

## 2016-07-07 NOTE — Patient Instructions (Signed)
   We will check an EEG study

## 2016-07-11 ENCOUNTER — Ambulatory Visit (INDEPENDENT_AMBULATORY_CARE_PROVIDER_SITE_OTHER): Payer: BC Managed Care – PPO

## 2016-07-11 DIAGNOSIS — R569 Unspecified convulsions: Secondary | ICD-10-CM | POA: Diagnosis not present

## 2016-07-14 ENCOUNTER — Telehealth: Payer: Self-pay | Admitting: Neurology

## 2016-07-14 DIAGNOSIS — D329 Benign neoplasm of meninges, unspecified: Secondary | ICD-10-CM

## 2016-07-14 DIAGNOSIS — Z5181 Encounter for therapeutic drug level monitoring: Secondary | ICD-10-CM

## 2016-07-14 NOTE — Telephone Encounter (Signed)
I called patient. EEG study was unremarkable, he claims he has not had a seizure off of medication since April 2017. We will keep him off medications at this point.  I will repeat the MRI the brain, he will need to come in to get blood work to check his renal function.

## 2016-07-14 NOTE — Procedures (Signed)
    History:  [name] is a 51 year old gentleman with a history of seizures and a parafalcine meningioma. The patient has edema in the right greater than left frontal lobes from the meningioma. He had been on Keppra but he went off the medication on his own in April 2017. He reports no recurring seizures. An EEG study is being repeated for this purpose.  This is a routine EEG. No skull defects are noted. Medications include Norvasc, Catapres, folic acid, hydrocodone, Janumet, Mobic, multivitamins, and Diovan.   EEG classification: Normal awake  Description of the recording: The background rhythms of this recording consists of a fairly well modulated medium amplitude alpha rhythm of 10 Hz that is reactive to eye opening and closure. As the record progresses, the patient appears to remain in the waking state throughout the recording. Photic stimulation was performed, resulting in a bilateral and symmetric photic driving response. Hyperventilation was also performed, resulting in a minimal buildup of the background rhythm activities without significant slowing seen. At no time during the recording does there appear to be evidence of spike or spike wave discharges or evidence of focal slowing. EKG monitor shows no evidence of cardiac rhythm abnormalities with a heart rate of 84.  Impression: This is a normal EEG recording in the waking state. No evidence of ictal or interictal discharges are seen.

## 2016-07-15 ENCOUNTER — Telehealth: Payer: Self-pay | Admitting: Neurology

## 2016-07-15 NOTE — Telephone Encounter (Signed)
pt states he does not have insurance anymore and i informed him we would give him a 55% discount with a payment plan and he stated he wants to wait right now

## 2016-07-22 ENCOUNTER — Ambulatory Visit: Payer: Self-pay | Admitting: Neurology

## 2016-07-22 ENCOUNTER — Telehealth: Payer: Self-pay

## 2016-07-22 NOTE — Telephone Encounter (Signed)
Pt did not show for their appt with Dr. Athar today.  

## 2016-07-23 ENCOUNTER — Encounter: Payer: Self-pay | Admitting: Neurology

## 2016-12-12 ENCOUNTER — Emergency Department (HOSPITAL_COMMUNITY): Payer: Medicaid Other

## 2016-12-12 ENCOUNTER — Inpatient Hospital Stay (HOSPITAL_COMMUNITY)
Admission: EM | Admit: 2016-12-12 | Discharge: 2016-12-18 | DRG: 064 | Disposition: A | Discharge status: Rehabilitation Facility | Payer: Medicaid Other | Attending: Family Medicine | Admitting: Family Medicine

## 2016-12-12 ENCOUNTER — Other Ambulatory Visit: Payer: Self-pay

## 2016-12-12 ENCOUNTER — Encounter (HOSPITAL_COMMUNITY): Payer: Self-pay | Admitting: Emergency Medicine

## 2016-12-12 DIAGNOSIS — Z85038 Personal history of other malignant neoplasm of large intestine: Secondary | ICD-10-CM

## 2016-12-12 DIAGNOSIS — I5189 Other ill-defined heart diseases: Secondary | ICD-10-CM

## 2016-12-12 DIAGNOSIS — Z8249 Family history of ischemic heart disease and other diseases of the circulatory system: Secondary | ICD-10-CM

## 2016-12-12 DIAGNOSIS — E1159 Type 2 diabetes mellitus with other circulatory complications: Secondary | ICD-10-CM

## 2016-12-12 DIAGNOSIS — E876 Hypokalemia: Secondary | ICD-10-CM | POA: Diagnosis present

## 2016-12-12 DIAGNOSIS — R531 Weakness: Secondary | ICD-10-CM | POA: Diagnosis present

## 2016-12-12 DIAGNOSIS — R809 Proteinuria, unspecified: Secondary | ICD-10-CM | POA: Diagnosis present

## 2016-12-12 DIAGNOSIS — G40909 Epilepsy, unspecified, not intractable, without status epilepticus: Secondary | ICD-10-CM | POA: Diagnosis present

## 2016-12-12 DIAGNOSIS — Z6839 Body mass index (BMI) 39.0-39.9, adult: Secondary | ICD-10-CM

## 2016-12-12 DIAGNOSIS — E669 Obesity, unspecified: Secondary | ICD-10-CM | POA: Diagnosis present

## 2016-12-12 DIAGNOSIS — N179 Acute kidney failure, unspecified: Secondary | ICD-10-CM | POA: Diagnosis present

## 2016-12-12 DIAGNOSIS — I10 Essential (primary) hypertension: Secondary | ICD-10-CM | POA: Diagnosis present

## 2016-12-12 DIAGNOSIS — G8194 Hemiplegia, unspecified affecting left nondominant side: Secondary | ICD-10-CM | POA: Diagnosis present

## 2016-12-12 DIAGNOSIS — R0602 Shortness of breath: Secondary | ICD-10-CM | POA: Diagnosis present

## 2016-12-12 DIAGNOSIS — G936 Cerebral edema: Secondary | ICD-10-CM | POA: Diagnosis present

## 2016-12-12 DIAGNOSIS — Z7984 Long term (current) use of oral hypoglycemic drugs: Secondary | ICD-10-CM

## 2016-12-12 DIAGNOSIS — E785 Hyperlipidemia, unspecified: Secondary | ICD-10-CM | POA: Diagnosis present

## 2016-12-12 DIAGNOSIS — R7303 Prediabetes: Secondary | ICD-10-CM

## 2016-12-12 DIAGNOSIS — R270 Ataxia, unspecified: Secondary | ICD-10-CM | POA: Diagnosis present

## 2016-12-12 DIAGNOSIS — E119 Type 2 diabetes mellitus without complications: Secondary | ICD-10-CM | POA: Diagnosis present

## 2016-12-12 DIAGNOSIS — R1310 Dysphagia, unspecified: Secondary | ICD-10-CM | POA: Diagnosis present

## 2016-12-12 DIAGNOSIS — G9389 Other specified disorders of brain: Secondary | ICD-10-CM | POA: Diagnosis present

## 2016-12-12 DIAGNOSIS — H5500 Unspecified nystagmus: Secondary | ICD-10-CM | POA: Diagnosis present

## 2016-12-12 DIAGNOSIS — Z823 Family history of stroke: Secondary | ICD-10-CM | POA: Diagnosis not present

## 2016-12-12 DIAGNOSIS — Z96642 Presence of left artificial hip joint: Secondary | ICD-10-CM | POA: Diagnosis present

## 2016-12-12 DIAGNOSIS — I6389 Other cerebral infarction: Principal | ICD-10-CM | POA: Diagnosis present

## 2016-12-12 DIAGNOSIS — I672 Cerebral atherosclerosis: Secondary | ICD-10-CM | POA: Diagnosis present

## 2016-12-12 DIAGNOSIS — I69354 Hemiplegia and hemiparesis following cerebral infarction affecting left non-dominant side: Secondary | ICD-10-CM | POA: Diagnosis not present

## 2016-12-12 DIAGNOSIS — I69391 Dysphagia following cerebral infarction: Secondary | ICD-10-CM

## 2016-12-12 DIAGNOSIS — R29705 NIHSS score 5: Secondary | ICD-10-CM | POA: Diagnosis present

## 2016-12-12 DIAGNOSIS — H532 Diplopia: Secondary | ICD-10-CM | POA: Diagnosis present

## 2016-12-12 DIAGNOSIS — I639 Cerebral infarction, unspecified: Secondary | ICD-10-CM | POA: Diagnosis present

## 2016-12-12 DIAGNOSIS — Z8349 Family history of other endocrine, nutritional and metabolic diseases: Secondary | ICD-10-CM

## 2016-12-12 LAB — BASIC METABOLIC PANEL
Anion gap: 12 (ref 5–15)
BUN: 12 mg/dL (ref 6–20)
CHLORIDE: 106 mmol/L (ref 101–111)
CO2: 22 mmol/L (ref 22–32)
Calcium: 9.8 mg/dL (ref 8.9–10.3)
Creatinine, Ser: 1.21 mg/dL (ref 0.61–1.24)
Glucose, Bld: 132 mg/dL — ABNORMAL HIGH (ref 65–99)
Potassium: 3.4 mmol/L — ABNORMAL LOW (ref 3.5–5.1)
Sodium: 140 mmol/L (ref 135–145)

## 2016-12-12 LAB — CBC
HCT: 39.4 % (ref 39.0–52.0)
Hemoglobin: 13.3 g/dL (ref 13.0–17.0)
MCH: 25.2 pg — ABNORMAL LOW (ref 26.0–34.0)
MCHC: 33.8 g/dL (ref 30.0–36.0)
MCV: 74.6 fL — AB (ref 78.0–100.0)
PLATELETS: 343 10*3/uL (ref 150–400)
RBC: 5.28 MIL/uL (ref 4.22–5.81)
RDW: 15.3 % (ref 11.5–15.5)
WBC: 6.5 10*3/uL (ref 4.0–10.5)

## 2016-12-12 LAB — I-STAT TROPONIN, ED: Troponin i, poc: 0.01 ng/mL (ref 0.00–0.08)

## 2016-12-12 LAB — BRAIN NATRIURETIC PEPTIDE: B Natriuretic Peptide: 59.9 pg/mL (ref 0.0–100.0)

## 2016-12-12 MED ORDER — LABETALOL HCL 5 MG/ML IV SOLN: 5.0000 mg | INTRAVENOUS | Status: DC | PRN

## 2016-12-12 MED ORDER — ASPIRIN 325 MG PO TABS
325.0000 mg | ORAL_TABLET | Freq: Every day | ORAL | Status: DC
  Administered 2016-12-13 – 2016-12-18 (×6): 325 mg via ORAL
  Filled 2016-12-12 (×6): qty 1

## 2016-12-12 MED ORDER — ACETAMINOPHEN 650 MG RE SUPP: 650.0000 mg | RECTAL | Status: DC | PRN

## 2016-12-12 MED ORDER — ASPIRIN 300 MG RE SUPP: 300.0000 mg | Freq: Every day | RECTAL | Status: DC

## 2016-12-12 MED ORDER — LABETALOL HCL 5 MG/ML IV SOLN
5.0000 mg | INTRAVENOUS | Status: DC | PRN
  Administered 2016-12-12 – 2016-12-13 (×2): 5 mg via INTRAVENOUS
  Administered 2016-12-13: 10 mg via INTRAVENOUS
  Administered 2016-12-13: 5 mg via INTRAVENOUS
  Administered 2016-12-13 – 2016-12-15 (×4): 10 mg via INTRAVENOUS
  Filled 2016-12-12 (×7): qty 4

## 2016-12-12 MED ORDER — STROKE: EARLY STAGES OF RECOVERY BOOK
Freq: Once | Status: AC
  Administered 2016-12-13: 03:00:00
  Filled 2016-12-12 (×2): qty 1

## 2016-12-12 MED ORDER — IRBESARTAN 150 MG PO TABS
150.0000 mg | ORAL_TABLET | Freq: Every day | ORAL | Status: DC
Start: 2016-12-13 — End: 2016-12-18
  Administered 2016-12-13 – 2016-12-18 (×6): 150 mg via ORAL
  Filled 2016-12-12 (×6): qty 1

## 2016-12-12 MED ORDER — INSULIN ASPART 100 UNIT/ML ~~LOC~~ SOLN: 0.0000 [IU] | Freq: Three times a day (TID) | SUBCUTANEOUS | Status: DC

## 2016-12-12 MED ORDER — ACETAMINOPHEN 160 MG/5ML PO SOLN: 650.0000 mg | ORAL | Status: DC | PRN

## 2016-12-12 MED ORDER — ACETAMINOPHEN 325 MG PO TABS: 650.0000 mg | ORAL_TABLET | ORAL | Status: DC | PRN

## 2016-12-12 MED ORDER — SENNOSIDES-DOCUSATE SODIUM 8.6-50 MG PO TABS: 1.0000 | ORAL_TABLET | Freq: Every evening | ORAL | Status: DC | PRN

## 2016-12-12 MED ORDER — AMLODIPINE BESYLATE 10 MG PO TABS
10.0000 mg | ORAL_TABLET | Freq: Every day | ORAL | Status: DC
  Administered 2016-12-13 – 2016-12-18 (×6): 10 mg via ORAL
  Filled 2016-12-12 (×6): qty 1

## 2016-12-12 MED ORDER — NICARDIPINE HCL IN NACL 20-0.86 MG/200ML-% IV SOLN
3.0000 mg/h | Freq: Once | INTRAVENOUS | Status: AC
  Administered 2016-12-12: 5 mg/h via INTRAVENOUS
  Filled 2016-12-12: qty 200

## 2016-12-12 MED ORDER — ENOXAPARIN SODIUM 40 MG/0.4ML ~~LOC~~ SOLN
40.0000 mg | SUBCUTANEOUS | Status: DC
  Administered 2016-12-13 – 2016-12-18 (×6): 40 mg via SUBCUTANEOUS
  Filled 2016-12-12 (×6): qty 0.4

## 2016-12-12 MED ORDER — INSULIN ASPART 100 UNIT/ML ~~LOC~~ SOLN
0.0000 [IU] | SUBCUTANEOUS | Status: DC
  Administered 2016-12-13: 2 [IU] via SUBCUTANEOUS

## 2016-12-12 MED ORDER — IOPAMIDOL (ISOVUE-370) INJECTION 76%
INTRAVENOUS | Status: AC
  Administered 2016-12-12: 50 mL
  Filled 2016-12-12: qty 50

## 2016-12-12 MED ORDER — LABETALOL HCL 5 MG/ML IV SOLN
5.0000 mg | INTRAVENOUS | Status: DC | PRN
  Filled 2016-12-12: qty 4

## 2016-12-12 NOTE — ED Notes (Signed)
Patient taken to CT with RN and Neurologist.  CT complete awaiting labs for CTA

## 2016-12-12 NOTE — ED Notes (Addendum)
Pt returned from [place] with RN. Dr. Leonel Ramsay at bedside.

## 2016-12-12 NOTE — ED Triage Notes (Signed)
Per EMS, pt has complaints of sudden weakness in his left leg ans left arm when pt woke up from sleep at 1am this morning.

## 2016-12-12 NOTE — H&P (Signed)
History and Physical    [name]:[id number] DOB: [birth year] DOA: 12/12/2016  PCP: Glendale Chard, MD  Patient coming from: Home  I have personally briefly reviewed patient's old medical records in [place]  Chief Complaint: L sided weakness  HPI: [name] is a 51 y.o. male with medical history significant of DM, HTN, and a chronic brain mass that's likely a meningioma.  Patient presents to the ED with c/o L sided weakness.  LKW when he went to bed last night.  Woke up at 1AM with weakness.  Went back to sleep.  Still present when he woke up again in AM.  Stable and persistent throughout the day.  Presents to ED because of persistent symptoms.   ED Course: CT head just shows chronic brain mass with vasogenic edema, essentially unchanged from a year ago.  Initial BP 240/140.  Has come down slightly to 200/110.   Review of Systems: As per HPI otherwise 10 point review of systems negative.   Past Medical History:  Diagnosis Date  . Colon cancer ([place])    rectal  . Diabetes mellitus without complication ([place])   . Hypertension   . Seizure [facility])     Past Surgical History:  Procedure Laterality Date  . APPENDECTOMY    . JOINT REPLACEMENT Left 2004   Left hip  . TOTAL HIP ARTHROPLASTY       reports that  has never smoked. he has never used smokeless tobacco. He reports that he drinks alcohol. He reports that he does not use drugs.  No Known Allergies  Family History  Problem Relation Age of Onset  . Hypertension Mother   . Heart disease Mother   . Hyperlipidemia Mother   . Heart failure Mother   . Hypertension Sister   . Hypertension Brother   . Stroke Father   . Hypertension Father   . Colon cancer Maternal Grandmother   . Diabetes Maternal Grandmother      Prior to Admission medications   Medication Sig Start Date End Date Taking? Authorizing Provider  amLODipine (NORVASC) 10 MG tablet Take 1 tablet (10 mg total) by mouth daily. 02/01/14   Yes Ward, Delice Bison, DO  ibuprofen (ADVIL,MOTRIN) 200 MG tablet Take 200-400 mg by mouth every 6 (six) hours as needed for fever, headache or mild pain.   Yes [provider]  JANUMET XR [phone] MG TB24 Take 1 tablet by mouth daily. 03/16/16  Yes [provider]  valsartan (DIOVAN) 160 MG tablet Take 160 mg by mouth daily. 03/13/16  Yes [provider]    Physical Exam: Vitals:   12/12/16 2218 12/12/16 2221 12/12/16 2230 12/12/16 2345  BP: (!) 223/114 (!) 218/114 (!) 193/104 (!) 213/101  Pulse: (!) 129 (!) 133 (!) 127 (!) 110  Resp: (!) 49 20 (!) 39 (!) 55  SpO2: 95% 94% 94% 93%  Weight:      Height:        Constitutional: NAD, calm, comfortable Eyes: PERRL, lids and conjunctivae normal ENMT: Mucous membranes are moist. Posterior pharynx clear of any exudate or lesions.Normal dentition.  Neck: normal, supple, no masses, no thyromegaly Respiratory: clear to auscultation bilaterally, no wheezing, no crackles. Normal respiratory effort. No accessory muscle use.  Cardiovascular: Regular rate and rhythm, no murmurs / rubs / gallops. No extremity edema. 2+ pedal pulses. No carotid bruits.  Abdomen: no tenderness, no masses palpated. No hepatosplenomegaly. Bowel sounds positive.  Musculoskeletal: no clubbing / cyanosis. No joint deformity  upper and lower extremities. Good ROM, no contractures. Normal muscle tone.  Skin: no rashes, lesions, ulcers. No induration Neurologic: 4/5 strength on L side, 5/5 on R.  Ataxic on left.  Limited L eye movement Psychiatric: Normal judgment and insight. Alert and oriented x 3. Normal mood.    Labs on Admission: I have personally reviewed following labs and imaging studies  CBC: Recent Labs  Lab 12/12/16 2148  WBC 6.5  HGB 13.3  HCT 39.4  MCV 74.6*  PLT 956   Basic Metabolic Panel: Recent Labs  Lab 12/12/16 2148  NA 140  K 3.4*  CL 106  CO2 22  GLUCOSE 132*  BUN 12  CREATININE 1.21  CALCIUM 9.8    GFR: Estimated Creatinine Clearance: 76.5 mL/min (by C-G formula based on SCr of 1.21 mg/dL). Liver Function Tests: No results for input(s): AST, ALT, ALKPHOS, BILITOT, PROT, ALBUMIN in the last 168 hours. No results for input(s): LIPASE, AMYLASE in the last 168 hours. No results for input(s): AMMONIA in the last 168 hours. Coagulation Profile: No results for input(s): INR, PROTIME in the last 168 hours. Cardiac Enzymes: No results for input(s): CKTOTAL, CKMB, CKMBINDEX, TROPONINI in the last 168 hours. BNP (last 3 results) No results for input(s): PROBNP in the last 8760 hours. HbA1C: No results for input(s): HGBA1C in the last 72 hours. CBG: No results for input(s): GLUCAP in the last 168 hours. Lipid Profile: No results for input(s): CHOL, HDL, LDLCALC, TRIG, CHOLHDL, LDLDIRECT in the last 72 hours. Thyroid Function Tests: No results for input(s): TSH, T4TOTAL, FREET4, T3FREE, THYROIDAB in the last 72 hours. Anemia Panel: No results for input(s): VITAMINB12, FOLATE, FERRITIN, TIBC, IRON, RETICCTPCT in the last 72 hours. Urine analysis:    Component Value Date/Time   COLORURINE YELLOW 07/25/2014 1459   APPEARANCEUR CLOUDY (A) 07/25/2014 1459   LABSPEC 1.012 07/25/2014 1459   PHURINE 5.0 07/25/2014 1459   GLUCOSEU 500 (A) 07/25/2014 1459   HGBUR SMALL (A) 07/25/2014 1459   BILIRUBINUR NEGATIVE 07/25/2014 1459   KETONESUR 40 (A) 07/25/2014 1459   PROTEINUR NEGATIVE 07/25/2014 1459   UROBILINOGEN 0.2 07/25/2014 1459   NITRITE NEGATIVE 07/25/2014 1459   LEUKOCYTESUR NEGATIVE 07/25/2014 1459    Radiological Exams on Admission: Ct Head Wo Contrast  Result Date: 12/12/2016 CLINICAL DATA:  51 y/o  M; frontal headaches, weakness in legs. EXAM: CT HEAD WITHOUT CONTRAST TECHNIQUE: Contiguous axial images were obtained from the base of the skull through the vertex without intravenous contrast. COMPARISON:  07/21/2014 CT head.  09/04/2014 MRI head. FINDINGS: Brain: Calcified right  medial frontal region mass measuring 4.3 x 2.1 x 2.3 cm (AP x ML x CC), stable from prior CT given differences in slice selection. Hypoattenuation within the adjacent right frontal lobe likely represents associated vasogenic edema and is without significant interval change from prior CT of head. Stable bilateral anterior frontal lobe encephalomalacia. No evidence for new large acute stroke, hemorrhage, or focal mass effect. No hydrocephalus or extra-axial collection Vascular: Dense calcification of paraclinoid internal carotid arteries. Skull: Normal. Negative for fracture or focal lesion. Sinuses/Orbits: Mild ethmoid sinus mucosal thickening. Normal aeration of mastoid air cells. Right external auditory canal opacification, likely cerumen. Orbits are unremarkable. Other: None. IMPRESSION: 1. No acute intracranial abnormality identified. 2. Stable right medial frontal region calcified mass and vasogenic edema in right frontal lobe. 3. Stable bilateral anterior frontal lobe encephalomalacia. 4. Mild ethmoid sinus disease. Electronically Signed   By: Kristine Garbe M.D.   On: 12/12/2016  22:43   Dg Chest Port 1 View  Result Date: 12/12/2016 CLINICAL DATA:  51 y/o M; sudden weakness in the left leg and left arm. Cough. EXAM: PORTABLE CHEST 1 VIEW COMPARISON:  07/25/2014 chest radiograph FINDINGS: Stable cardiomegaly. Clear lungs. No pleural effusion or pneumothorax. No acute osseous abnormality is evident. IMPRESSION: Stable cardiomegaly.  No acute pulmonary process identified. Electronically Signed   By: Kristine Garbe M.D.   On: 12/12/2016 22:50    EKG: Independently reviewed.  Assessment/Plan Principal Problem:   Acute ischemic stroke [facility]) Active Problems:   Hypertension   DM2 (diabetes mellitus, type 2) (HCC)   Brain mass    1. 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 Stroke pathway 2. Neuro has seen patient 3. MRI brain,  2d echo ordered 4. PT/OT/SLP 5. ASA 325 2. HTN - 1. Patient with SOB, crackly lung sounds, likely early pulm edema / vascular congestion 2. Therefore will not allow same degree of permissive HTN for stroke as we normally would. 3. Continue home BP meds 4. Add labetalol PRN 5. Goal BPs around 536 systolic as he isnt tolerating the 200s very well. 6. Discussed with neurology. 3. DM2 - 1. Hold home meds 2. Sensitive scale SSI Q4H while NPO till speech sees him. 4. Brain mass - 1. Calcified, chronic, and unchanged from last year. 2. Likely a meningioma 3. Unlikely related to todays presentation per neuro.  DVT prophylaxis: Lovenox Code Status: Full Family Communication: No family in room Disposition Plan: Home after admit Consults called: Neuro Admission status: Admit to inpatient   Etta Quill DO [facility] Pager [phone]  If 7AM-7PM, please contact day team taking care of patient [email or web address] Password [email or web address]  12/12/2016, 11:50 PM

## 2016-12-12 NOTE — Consult Note (Addendum)
Neurology Consultation Reason for Consult: Left-sided weakness Referring Physician: Lita Mains, D  CC: Left-sided weakness  History is obtained from: Patient  HPI: [name] is a 51 y.o. male who was last seen normal when he went to bed on 11/29, he awoke at 1 AM with left-sided weakness.  He went back to sleep, and when he awoke at 1 3 AM it was still present.  Over the course of the day, he feels that it is been stable to may be slightly improved, but still very much troubling to him.  He therefore has sought further care in the emergency department this evening.  Also, he has had severe hiccups over the course of the day.  LKW: 11/29 prior to bed tpa given?: no, out of window   ROS: A 14 point ROS was performed and is negative except as noted in the HPI.   Past Medical History:  Diagnosis Date  . Colon cancer ([place])    rectal  . Diabetes mellitus without complication ([place])   . Hypertension   . Seizure [facility])     Family History  Problem Relation Age of Onset  . Hypertension Mother   . Heart disease Mother   . Hyperlipidemia Mother   . Heart failure Mother   . Hypertension Sister   . Hypertension Brother   . Stroke Father   . Hypertension Father   . Colon cancer Maternal Grandmother   . Diabetes Maternal Grandmother      Social History:  reports that  has never smoked. he has never used smokeless tobacco. He reports that he drinks alcohol. He reports that he does not use drugs.   Exam: Current vital signs: BP (!) 193/104   Pulse (!) 127   Resp (!) 39   Ht 5\' 3"  (1.6 m)   Wt 99.8 kg (220 lb)   SpO2 94%   BMI 38.97 kg/m  Vital signs in last 24 hours: Pulse Rate:  [100-133] 127 (11/30 2230) Resp:  [19-52] 39 (11/30 2230) BP: (193-240)/(104-146) 193/104 (11/30 2230) SpO2:  [93 %-98 %] 94 % (11/30 2230) Weight:  [99.8 kg (220 lb)] 99.8 kg (220 lb) (11/30 2128)   Physical Exam  Constitutional: Appears well-developed and well-nourished.  Psych: Affect  appropriate to situation Eyes: No scleral injection HENT: No OP obstrucion Head: Normocephalic.  Cardiovascular: Normal rate and regular rhythm.  Respiratory: Effort normal and breath sounds normal to anterior ascultation GI: Soft.  No distension. There is no tenderness.  Skin: WDI  Neuro: Mental Status: Patient is awake, alert, oriented to person, place, month, year, and situation. Patient is able to give a clear and coherent history. No signs of aphasia or neglect Cranial Nerves: II: Visual Fields are full. Pupils are equal, round, and reactive to light.   III,IV, VI: His left eye has limited movements inwards, upwards, downwards.  He has marked multidirectional nystagmus in all directions of gaze V: Facial sensation is symmetric VII: Facial movement is symmetric.  VIII: hearing is intact to voice X: Uvula does not elevate on the left as well as the right XI: Shoulder shrug is symmetric. XII: tongue is midline without atrophy or fasciculations.  Motor: Tone is normal. Bulk is normal. 5/5 strength was present on the right, he has 4/5 weakness of the left arm and leg, coordination out of proportion to weakness Sensory: Sensation is diminished throughout the left side Deep Tendon Reflexes: 2+ and symmetric in the biceps and patellae. Cerebellar: He is ataxic on the left,  both arm and leg.   I have reviewed labs in epic and the results pertinent to this consultation are: BMP-hypokalemia at 3.4   I have reviewed the images obtained: CT head-stable parafalcine calcified mass, CTA- no basilar occlusion  Impression: 51 year old male with left-sided weakness, ataxia, nystagmus.  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  I do suspect that he has had an ischemic infarct, likely due to small vessel disease.  Typically I would favor permissive hypertension, however he is experiencing some shortness of breath, possibly pulmonary edema and therefore I think it is  reasonable to go ahead and lower his BP to keep him around 902 systolic.  Recommendations: 1. HgbA1c, fasting lipid panel 2. MRI of the brain without contrast 3. Frequent neuro checks 4. Echocardiogram 5.  Follow-up CTA read 6. Prophylactic therapy-Antiplatelet med: Aspirin - dose 325mg  PO or 300mg  PR 7. Risk factor modification 8. Telemetry monitoring 9. PT consult, OT consult, Speech consult 10. please page stroke NP  Or  PA  Or MD  from 8am -4 pm as this patient will be followed by the stroke team at this point.   You can look them up on [email or web address]    Roland Rack, MD [facility] [phone]  If 7pm- 7am, please page neurology on call as listed in [place].

## 2016-12-12 NOTE — ED Notes (Signed)
Patient taken to CT.

## 2016-12-12 NOTE — ED Notes (Signed)
Patient aware for the need of Urine

## 2016-12-12 NOTE — ED Provider Notes (Signed)
[place] EMERGENCY DEPARTMENT Provider Note   CSN: [id number] Arrival date & time: 12/12/16  2123     History   Chief Complaint Chief Complaint  Patient presents with  . Weakness    HPI [name] is a 51 y.o. male.  HPI Patient states he went to sleep yesterday evening around 10 or 11:00 PM.  States he was in his normal health.  He has not taken his blood pressure medication for several days.  He woke around 1 AM with a global headache, left arm and left leg weakness and numbness.  He states his headache has resolved but he continues to have weakness and numbness.  He initially had some nausea but this is also resolved.  He denies chest pain at any point.  He has had increasing cough especially when he lies flat.  Patient denies any stimulant use.  States he rarely drinks alcohol. Past Medical History:  Diagnosis Date  . Colon cancer ([place])    rectal  . Diabetes mellitus without complication ([place])   . Hypertension   . Seizure [facility])     Patient Active Problem List   Diagnosis Date Noted  . Acute respiratory failure with hypercapnia ([place])   . Seizures ([place]) 07/22/2014  . DKA (diabetic ketoacidoses) ([place]) 07/22/2014  . Hypertension 07/22/2014  . Hyperglycemia 07/22/2014  . Hyperosmolar (nonketotic) coma ([place]) 07/22/2014  . Diabetic ketoacidosis associated with other specified diabetes mellitus   . Pyrexia   . Brain mass   . Malignant neoplasm of rectum ([place]) 09/02/2007    Past Surgical History:  Procedure Laterality Date  . APPENDECTOMY    . JOINT REPLACEMENT Left 2004   Left hip  . TOTAL HIP ARTHROPLASTY         Home Medications    Prior to Admission medications   Medication Sig Start Date End Date Taking? Authorizing Provider  amLODipine (NORVASC) 10 MG tablet Take 1 tablet (10 mg total) by mouth daily. 02/01/14  Yes Ward, Delice Bison, DO  ibuprofen (ADVIL,MOTRIN) 200 MG tablet Take 200-400 mg by mouth every 6 (six) hours as needed  for fever, headache or mild pain.   Yes [provider]  JANUMET XR [phone] MG TB24 Take 1 tablet by mouth daily. 03/16/16  Yes [provider]  valsartan (DIOVAN) 160 MG tablet Take 160 mg by mouth daily. 03/13/16  Yes [provider]    Family History Family History  Problem Relation Age of Onset  . Hypertension Mother   . Heart disease Mother   . Hyperlipidemia Mother   . Heart failure Mother   . Hypertension Sister   . Hypertension Brother   . Stroke Father   . Hypertension Father   . Colon cancer Maternal Grandmother   . Diabetes Maternal Grandmother     Social History Social History   Tobacco Use  . Smoking status: Never Smoker  . Smokeless tobacco: Never Used  Substance Use Topics  . Alcohol use: Yes    Alcohol/week: 0.0 oz    Comment: socially  . Drug use: No     Allergies   Patient has no known allergies.   Review of Systems Review of Systems  Constitutional: Negative for chills and fever.  Eyes: Negative for visual disturbance.  Respiratory: Positive for cough. Negative for shortness of breath.   Cardiovascular: Negative for chest pain, palpitations and leg swelling.  Gastrointestinal: Positive for nausea. Negative for abdominal pain, diarrhea and vomiting.  Musculoskeletal: Negative for arthralgias,  myalgias, neck pain and neck stiffness.  Skin: Negative for rash and wound.  Neurological: Positive for weakness, numbness and headaches. Negative for dizziness and light-headedness.  All other systems reviewed and are negative.    Physical Exam Updated Vital Signs BP (!) 193/104   Pulse (!) 127   Resp (!) 39   Ht 5\' 3"  (1.6 m)   Wt 99.8 kg (220 lb)   SpO2 94%   BMI 38.97 kg/m   Physical Exam  Constitutional: He is oriented to person, place, and time. He appears well-developed and well-nourished.  Mildly drowsy  HENT:  Head: Normocephalic and atraumatic.  Mouth/Throat: No oropharyngeal exudate.  No evidence of head  injury.  No intraoral trauma.  Eyes: EOM are normal. Pupils are equal, round, and reactive to light.  Neck: Normal range of motion. Neck supple.  No meningismus  Cardiovascular: Regular rhythm.  Tachycardia  Pulmonary/Chest: Effort normal and breath sounds normal.  Abdominal: Soft. Bowel sounds are normal. There is no tenderness. There is no rebound and no guarding.  Musculoskeletal: Normal range of motion. He exhibits no edema or tenderness.  Neurological: He is alert and oriented to person, place, and time.  3/5 motor in left upper extremity.  4/5 motor in left lower extremity.  Numbness to the left upper and lower extremities.  5/5 motor right upper and lower extremities.  Cranial nerves II through XII grossly intact.  Skin: Skin is warm and dry. No rash noted. No erythema.  Psychiatric: He has a normal mood and affect. His behavior is normal.  Nursing note and vitals reviewed.    ED Treatments / Results  Labs (all labs ordered are listed, but only abnormal results are displayed) Labs Reviewed  BASIC METABOLIC PANEL - Abnormal; Notable for the following components:      Result Value   Potassium 3.4 (*)    Glucose, Bld 132 (*)    All other components within normal limits  CBC - Abnormal; Notable for the following components:   MCV 74.6 (*)    MCH 25.2 (*)    All other components within normal limits  BRAIN NATRIURETIC PEPTIDE  URINALYSIS, ROUTINE W REFLEX MICROSCOPIC  RAPID URINE DRUG SCREEN, HOSP PERFORMED  I-STAT TROPONIN, ED  CBG MONITORING, ED    EKG  EKG Interpretation None       Radiology Ct Head Wo Contrast  Result Date: 12/12/2016 CLINICAL DATA:  51 y/o  M; frontal headaches, weakness in legs. EXAM: CT HEAD WITHOUT CONTRAST TECHNIQUE: Contiguous axial images were obtained from the base of the skull through the vertex without intravenous contrast. COMPARISON:  07/21/2014 CT head.  09/04/2014 MRI head. FINDINGS: Brain: Calcified right medial frontal region mass  measuring 4.3 x 2.1 x 2.3 cm (AP x ML x CC), stable from prior CT given differences in slice selection. Hypoattenuation within the adjacent right frontal lobe likely represents associated vasogenic edema and is without significant interval change from prior CT of head. Stable bilateral anterior frontal lobe encephalomalacia. No evidence for new large acute stroke, hemorrhage, or focal mass effect. No hydrocephalus or extra-axial collection Vascular: Dense calcification of paraclinoid internal carotid arteries. Skull: Normal. Negative for fracture or focal lesion. Sinuses/Orbits: Mild ethmoid sinus mucosal thickening. Normal aeration of mastoid air cells. Right external auditory canal opacification, likely cerumen. Orbits are unremarkable. Other: None. IMPRESSION: 1. No acute intracranial abnormality identified. 2. Stable right medial frontal region calcified mass and vasogenic edema in right frontal lobe. 3. Stable bilateral anterior frontal lobe encephalomalacia.  4. Mild ethmoid sinus disease. Electronically Signed   By: Kristine Garbe M.D.   On: 12/12/2016 22:43   Dg Chest Port 1 View  Result Date: 12/12/2016 CLINICAL DATA:  51 y/o M; sudden weakness in the left leg and left arm. Cough. EXAM: PORTABLE CHEST 1 VIEW COMPARISON:  07/25/2014 chest radiograph FINDINGS: Stable cardiomegaly. Clear lungs. No pleural effusion or pneumothorax. No acute osseous abnormality is evident. IMPRESSION: Stable cardiomegaly.  No acute pulmonary process identified. Electronically Signed   By: Kristine Garbe M.D.   On: 12/12/2016 22:50    Procedures Procedures (including critical care time)  Medications Ordered in ED Medications  insulin aspart (novoLOG) injection 0-9 Units (not administered)  labetalol (NORMODYNE,TRANDATE) injection 5 mg (not administered)  nicardipine (CARDENE) 20mg  in 0.86% saline 231ml IV infusion (0.1 mg/ml) (0 mg/hr Intravenous Stopped 12/12/16 2317)  iopamidol (ISOVUE-370) 76  % injection (50 mLs  Contrast Given 12/12/16 2314)   CRITICAL CARE Performed by: Julianne Rice Total critical care time: 35 minutes Critical care time was exclusive of separately billable procedures and treating other patients. Critical care was necessary to treat or prevent imminent or life-threatening deterioration. Critical care was time spent personally by me on the following activities: development of treatment plan with patient and/or surrogate as well as nursing, discussions with consultants, evaluation of patient's response to treatment, examination of patient, obtaining history from patient or surrogate, ordering and performing treatments and interventions, ordering and review of laboratory studies, ordering and review of radiographic studies, pulse oximetry and re-evaluation of patient's condition.  Initial Impression / Assessment and Plan / ED Course  I have reviewed the triage vital signs and the nursing notes.  Pertinent labs & imaging results that were available during my care of the patient were reviewed by me and considered in my medical decision making (see chart for details).     Discussed with Dr. Leonel Ramsay who evaluated the patient.  Believes patient likely has had a stroke.  Advises permissive hypertension with labetalol doses to keep systolic blood pressure less than 220.  Spoke with hospitalist who will see patient in the emergency department and admit  Final Clinical Impressions(s) / ED Diagnoses   Final diagnoses:  Weakness    ED Discharge Orders    None       Julianne Rice, MD 12/12/16 2343

## 2016-12-13 ENCOUNTER — Inpatient Hospital Stay (HOSPITAL_COMMUNITY): Payer: Medicaid Other

## 2016-12-13 DIAGNOSIS — I5030 Unspecified diastolic (congestive) heart failure: Secondary | ICD-10-CM

## 2016-12-13 DIAGNOSIS — I160 Hypertensive urgency: Secondary | ICD-10-CM

## 2016-12-13 DIAGNOSIS — I69354 Hemiplegia and hemiparesis following cerebral infarction affecting left non-dominant side: Secondary | ICD-10-CM

## 2016-12-13 LAB — URINALYSIS, ROUTINE W REFLEX MICROSCOPIC
BILIRUBIN URINE: NEGATIVE
Bacteria, UA: NONE SEEN
GLUCOSE, UA: 50 mg/dL — AB
Ketones, ur: NEGATIVE mg/dL
LEUKOCYTES UA: NEGATIVE
NITRITE: NEGATIVE
Protein, ur: >=300 mg/dL — AB
SPECIFIC GRAVITY, URINE: 1.018 (ref 1.005–1.030)
pH: 5 (ref 5.0–8.0)

## 2016-12-13 LAB — GLUCOSE, CAPILLARY
GLUCOSE-CAPILLARY: 119 mg/dL — AB (ref 65–99)
GLUCOSE-CAPILLARY: 147 mg/dL — AB (ref 65–99)
Glucose-Capillary: 141 mg/dL — ABNORMAL HIGH (ref 65–99)
Glucose-Capillary: 144 mg/dL — ABNORMAL HIGH (ref 65–99)
Glucose-Capillary: 101 mg/dL — ABNORMAL HIGH (ref 65–99)

## 2016-12-13 LAB — LIPID PANEL
CHOLESTEROL: 227 mg/dL — AB (ref 0–200)
HDL: 58 mg/dL (ref >40)
LDL CALC: 154 mg/dL — AB (ref 0–99)
TRIGLYCERIDES: 77 mg/dL (ref <150)
Total CHOL/HDL Ratio: 3.9 RATIO
VLDL: 15 mg/dL (ref 0–40)

## 2016-12-13 LAB — RAPID URINE DRUG SCREEN, HOSP PERFORMED
Amphetamines: NOT DETECTED
BARBITURATES: NOT DETECTED
Benzodiazepines: NOT DETECTED
COCAINE: NOT DETECTED
Opiates: NOT DETECTED
TETRAHYDROCANNABINOL: NOT DETECTED

## 2016-12-13 LAB — HIV ANTIBODY (ROUTINE TESTING W REFLEX): HIV SCREEN 4TH GENERATION: NONREACTIVE

## 2016-12-13 LAB — ECHOCARDIOGRAM COMPLETE
Height: 63 in
Weight: 3520 oz

## 2016-12-13 LAB — HEMOGLOBIN A1C
HEMOGLOBIN A1C: 6.2 % — AB (ref 4.8–5.6)
Mean Plasma Glucose: 131.24 mg/dL

## 2016-12-13 MED ORDER — CLOPIDOGREL BISULFATE 75 MG PO TABS: 75.0000 mg | ORAL_TABLET | Freq: Every day | ORAL | Status: DC

## 2016-12-13 MED ORDER — ATORVASTATIN CALCIUM 80 MG PO TABS
80.0000 mg | ORAL_TABLET | Freq: Every day | ORAL | Status: DC
  Administered 2016-12-13 – 2016-12-17 (×5): 80 mg via ORAL
  Filled 2016-12-13 (×5): qty 1

## 2016-12-13 MED ORDER — POTASSIUM CHLORIDE CRYS ER 20 MEQ PO TBCR
20.0000 meq | EXTENDED_RELEASE_TABLET | Freq: Two times a day (BID) | ORAL | Status: DC
  Administered 2016-12-13: 20 meq via ORAL
  Filled 2016-12-13: qty 1

## 2016-12-13 MED ORDER — INSULIN ASPART 100 UNIT/ML ~~LOC~~ SOLN
0.0000 [IU] | Freq: Three times a day (TID) | SUBCUTANEOUS | Status: DC
  Administered 2016-12-14: 1 [IU] via SUBCUTANEOUS
  Administered 2016-12-15: 2 [IU] via SUBCUTANEOUS
  Administered 2016-12-15 – 2016-12-18 (×4): 1 [IU] via SUBCUTANEOUS

## 2016-12-13 NOTE — Progress Notes (Signed)
Rehab Admissions Coordinator Note:  Patient was screened by Cleatrice Burke for appropriateness for an Inpatient Acute Rehab Consult per therapy recommendations.  At this time, we are recommending Inpatient Rehab consult. Please place an order for a rehab consult.  Cleatrice Burke 12/13/2016, 6:03 PM  I can be reached at [phone].

## 2016-12-13 NOTE — Evaluation (Signed)
Physical Therapy Evaluation Patient Details Name: [name] MRN: [id number] DOB: 1965/07/07 Today's Date: 12/13/2016   History of Present Illness  51 y.o.malewith medical history significant ofDM, HTN, and a chronic brain mass that's likely a meningioma. Patient presents to the ED with c/o L sided weakness. CT head just shows chronic brain mass with vasogenic edema, essentially unchanged from a year ago. 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  Clinical Impression  Orders received for PT evaluation. Patient demonstrates deficits in functional mobility as indicated below. Will benefit from continued skilled PT to address deficits and maximize function. Will see as indicated and progress as tolerated.  Prior to admission patient was independent with activity and working, patient now currently requiring increased physical assist for all aspects of mobility. Demonstrating left sided deficits in addition to other neurologic symptoms. Given current impairments, need for physical assist and prior level of function, feel patient will need comprehensive therapies for functional recovery. Will need CIR.    Follow Up Recommendations CIR    Equipment Recommendations  Other (comment)    Recommendations for Other Services Rehab consult     Precautions / Restrictions Precautions Precautions: Fall      Mobility  Bed Mobility Overal bed mobility: Needs Assistance Bed Mobility: Rolling;Sidelying to Sit;Sit to Sidelying Rolling: Supervision(To Left using bedrail ) Sidelying to sit: Mod assist     Sit to sidelying: Min assist General bed mobility comments: assist to move LEs off bed, to lift trunk and to shift weight to Lt and to balance self   Transfers                 General transfer comment: unable to attempt due to elevated BP   Ambulation/Gait                Stairs            Wheelchair Mobility     Modified Rankin (Stroke Patients Only) Modified Rankin (Stroke Patients Only) Pre-Morbid Rankin Score: No symptoms Modified Rankin: Moderately severe disability     Balance Overall balance assessment: Needs assistance Sitting-balance support: Feet supported Sitting balance-Leahy Scale: Fair Sitting balance - Comments: pt requires close min guard assist EOB                                      Pertinent Vitals/Pain Pain Assessment: No/denies pain    Home Living Family/patient expects to be discharged to:: Other (Comment) Living Arrangements: Spouse/significant other Available Help at Discharge: Family Type of Home: House Home Access: Stairs to enter Entrance Stairs-Rails: None Entrance Stairs-Number of Steps: 2 Home Layout: One level Home Equipment: Environmental consultant - 2 wheels;Cane - single point;Bedside commode;Shower seat;Toilet riser;Grab bars - tub/shower Additional Comments: Pt reports spouse and daughter work part time     Prior Function Level of Independence: Independent         Comments: 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: Right    Extremity/Trunk Assessment   Upper Extremity Assessment Upper Extremity Assessment: LUE deficits/detail LUE Deficits / Details: Pt demonstrates movement in Brunnstrom beginning stage IV; hand Brunnstrom stage IV LUE Sensation: decreased light touch;decreased proprioception LUE Coordination: decreased fine motor;decreased gross motor    Lower Extremity Assessment Lower Extremity Assessment: LLE deficits/detail LLE Deficits /  Details: patient demonstrates decreased gross strength 3/5 most motions LLE Sensation: decreased light touch LLE Coordination: decreased fine motor;decreased gross motor    Cervical / Trunk Assessment Cervical / Trunk Assessment: Other exceptions Cervical / Trunk Exceptions: decreased activation of Lt trunk    Communication   Communication: No difficulties  Cognition Arousal/Alertness: Awake/alert Behavior During Therapy: Flat affect Overall Cognitive Status: Within Functional Limits for tasks assessed                                 General Comments: Cognition intact for basic info.  Will benefit from further assessment       General Comments General comments (skin integrity, edema, etc.): BP sitting EOB 188/127, he was returned to supine - RN notified     Exercises     Assessment/Plan    PT Assessment Patient needs continued PT services  PT Problem List Decreased strength;Decreased activity tolerance;Decreased balance;Decreased mobility;Decreased coordination;Decreased cognition;Impaired sensation       PT Treatment Interventions DME instruction;Gait training;Functional mobility training;Therapeutic activities;Therapeutic exercise;Balance training;Neuromuscular re-education;Cognitive remediation;Patient/family education    PT Goals (Current goals can be found in the Care Plan section)  Acute Rehab PT Goals Patient Stated Goal: To get better  PT Goal Formulation: With patient Time For Goal Achievement: 12/27/16 Potential to Achieve Goals: Good    Frequency Min 4X/week   Barriers to discharge        Co-evaluation PT/OT/SLP Co-Evaluation/Treatment: Yes Reason for Co-Treatment: Complexity of the patient's impairments (multi-system involvement);For patient/therapist safety;To address functional/ADL transfers PT goals addressed during session: Mobility/safety with mobility OT goals addressed during session: ADL's and self-care       AM-PAC PT "6 Clicks" Daily Activity  Outcome Measure Difficulty turning over in bed (including adjusting bedclothes, sheets and blankets)?: Unable Difficulty moving from lying on back to sitting on the side of the bed? : Unable Difficulty sitting down on and standing up from a chair with arms (e.g., wheelchair, bedside commode,  etc,.)?: Unable Help needed moving to and from a bed to chair (including a wheelchair)?: A Lot Help needed walking in hospital room?: A Lot Help needed climbing 3-5 steps with a railing? : Total 6 Click Score: 8    End of Session   Activity Tolerance: Treatment limited secondary to medical complications (Comment)(elevated BP at this time) Patient left: in bed;with call bell/phone within reach;with bed alarm set Nurse Communication: Mobility status PT Visit Diagnosis: Other symptoms and signs involving the nervous system (R29.898)    Time: 6578-4696 PT Time Calculation (min) (ACUTE ONLY): 19 min   Charges:   PT Evaluation $PT Eval Moderate Complexity: 1 Mod     PT G Codes:        Alben Deeds, PT DPT  Board Certified Neurologic Specialist [phone]   Duncan Dull 12/13/2016, 11:32 AM

## 2016-12-13 NOTE — Progress Notes (Signed)
PROGRESS NOTE    [name]   YQM:[id number]  DOB: 1965/02/20  DOA: 12/12/2016 PCP: Glendale Chard, MD   Brief Narrative:  [name] 51 y.o. male with medical history significant of DM, HTN, and a chronic brain mass that's likely a meningioma.  Patient presents to the ED with c/o L sided weakness. BP 240/140  Subjective: Continues to have left sided weakness. Also complains of blurred vision and on exam, admits to seeing multiple of each object. ROS: no complaints of nausea, vomiting, constipation diarrhea, cough, dyspnea or dysuria. No other complaints.   Assessment & Plan:   Principal Problem:   Acute ischemic stroke   - ? Due to HTN- admits that he ran out of his Clonidine and ARB a few months ago - CT head: Stable right medial frontal region calcified mass and vasogenic edema in right frontal lobe. Stable bilateral anterior frontal lobe encephalomalacia. CTA: Moderate intracranial atherosclerotic disease MRI brain: Acute RIGHT paramedian brainstem infarct affecting lower pons and upper medulla... Heavily calcified mass, probably intra-axial, involves the medial RIGHT hemisphere, slightly increased from 2016. - Lipids: Cholesterol 227, LDL 154, HDL 58- Lipitor started - A1c: 6.2 - symptoms of left sided weakness and visual changes - f/u therapy evals  Active Problems: Calcified mass in brain - chronic, appears to be increasing in size based on report- neuro to give further recommendations   Hypertension - severe - on Amlodipine at home- ran out of Valsartan and Clonidine - resumed Irbesartan today  HLD - see above- Lipitor started  Proteinuria - ? Due to HTN     DM2 (diabetes mellitus, type 2)  - A1c controlled at 6.2- takes Janumet- cont SSI   DVT prophylaxis: Lovenox Code Status: Full code Family Communication:  Disposition Plan: follow on tele Consultants:   neuro Procedures:    Antimicrobials:  Anti-infectives (From admission, onward)    None       Objective: Vitals:   12/13/16 0218 12/13/16 0408 12/13/16 0604 12/13/16 0605  BP: (!) 196/124 (!) 181/114 (!) 194/108 (!) 192/117  Pulse: 92 82 84   Resp: (!) 24 (!) 26 (!) 24   Temp: 98.6 F (37 C) 98.6 F (37 C) 98.4 F (36.9 C)   TempSrc: Oral Oral    SpO2: 94% 95% 94%   Weight:      Height:        Intake/Output Summary (Last 24 hours) at 12/13/2016 0829 Last data filed at 12/12/2016 2317 Gross per 24 hour  Intake 122.08 ml  Output -  Net 122.08 ml   Filed Weights   12/12/16 2128  Weight: 99.8 kg (220 lb)    Examination: General exam: Appears comfortable  HEENT: PERRLA, oral mucosa moist, no sclera icterus or thrush Respiratory system: Clear to auscultation. Respiratory effort normal. Cardiovascular system: S1 & S2 heard, RRR.  No murmurs  Gastrointestinal system: Abdomen soft, non-tender, nondistended. Normal bowel sound. No organomegaly Central nervous system: Alert and oriented. Blurred vision and diplopia- left arm and leg 4/5 strength Extremities: No cyanosis, clubbing or edema Skin: No rashes or ulcers Psychiatry:  Mood & affect appropriate.     Data Reviewed: I have personally reviewed following labs and imaging studies  CBC: Recent Labs  Lab 12/12/16 2148  WBC 6.5  HGB 13.3  HCT 39.4  MCV 74.6*  PLT 528   Basic Metabolic Panel: Recent Labs  Lab 12/12/16 2148  NA 140  K 3.4*  CL 106  CO2 22  GLUCOSE 132*  BUN 12  CREATININE 1.21  CALCIUM 9.8   GFR: Estimated Creatinine Clearance: 76.5 mL/min (by C-G formula based on SCr of 1.21 mg/dL). Liver Function Tests: No results for input(s): AST, ALT, ALKPHOS, BILITOT, PROT, ALBUMIN in the last 168 hours. No results for input(s): LIPASE, AMYLASE in the last 168 hours. No results for input(s): AMMONIA in the last 168 hours. Coagulation Profile: No results for input(s): INR, PROTIME in the last 168 hours. Cardiac Enzymes: No results for input(s): CKTOTAL, CKMB, CKMBINDEX,  TROPONINI in the last 168 hours. BNP (last 3 results) No results for input(s): PROBNP in the last 8760 hours. HbA1C: Recent Labs    12/13/16 0421  HGBA1C 6.2*   CBG: Recent Labs  Lab 12/13/16 0410 12/13/16 0752  GLUCAP 141* 119*   Lipid Profile: Recent Labs    12/13/16 0421  CHOL 227*  HDL 58  LDLCALC 154*  TRIG 77  CHOLHDL 3.9   Thyroid Function Tests: No results for input(s): TSH, T4TOTAL, FREET4, T3FREE, THYROIDAB in the last 72 hours. Anemia Panel: No results for input(s): VITAMINB12, FOLATE, FERRITIN, TIBC, IRON, RETICCTPCT in the last 72 hours. Urine analysis:    Component Value Date/Time   COLORURINE YELLOW 12/13/2016 0133   APPEARANCEUR CLEAR 12/13/2016 0133   LABSPEC 1.018 12/13/2016 0133   PHURINE 5.0 12/13/2016 0133   GLUCOSEU 50 (A) 12/13/2016 0133   HGBUR MODERATE (A) 12/13/2016 0133   BILIRUBINUR NEGATIVE 12/13/2016 0133   KETONESUR NEGATIVE 12/13/2016 0133   PROTEINUR >=300 (A) 12/13/2016 0133   UROBILINOGEN 0.2 07/25/2014 1459   NITRITE NEGATIVE 12/13/2016 0133   LEUKOCYTESUR NEGATIVE 12/13/2016 0133   Sepsis Labs: @LABRCNTIP (procalcitonin:4,lacticidven:4) )No results found for this or any previous visit (from the past 240 hour(s)).       Radiology Studies: Ct Angio Head W Or Wo Contrast  Result Date: 12/13/2016 CLINICAL DATA:  Initial evaluation for acute left-sided weakness. EXAM: CT ANGIOGRAPHY HEAD AND NECK TECHNIQUE: Multidetector CT imaging of the head and neck was performed using the standard protocol during bolus administration of intravenous contrast. Multiplanar CT image reconstructions and MIPs were obtained to evaluate the vascular anatomy. Carotid stenosis measurements (when applicable) are obtained utilizing NASCET criteria, using the distal internal carotid diameter as the denominator. CONTRAST:  37mL ISOVUE-370 IOPAMIDOL (ISOVUE-370) INJECTION 76% COMPARISON:  Prior CT from earlier the same day. FINDINGS: CTA NECK FINDINGS  Aortic arch: Visualized aortic arch of normal caliber with normal branch pattern. No flow-limiting stenosis about the origin of the great vessels. Visualized subclavian artery is widely patent. Right carotid system: Right common and internal carotid arteries are widely patent without stenosis, dissection, or occlusion. Minimal atheromatous plaque about the right carotid bifurcation without significant stenosis. Left carotid system: Left common and internal carotid arteries are widely patent without stenosis, dissection, or occlusion. Atheromatous plaque about the left carotid bifurcation with relative mild narrowing of no more than 25% by NASCET criteria. Vertebral arteries: Both of the vertebral arteries arise from the subclavian arteries. Right vertebral artery dominant. Mild-to-moderate stenosis at the origin of the left vertebral artery. Vertebral arteries otherwise patent within the neck without stenosis or occlusion. Skeleton: No acute osseous abnormality. No worrisome lytic or blastic osseous lesions. Other neck: No acute soft tissue abnormality within the neck. Thyroid normal. Salivary glands within normal limits. No adenopathy. Upper chest: Visualized upper chest within normal limits. Partially visualized lungs are clear. Review of the MIP images confirms the above findings CTA HEAD FINDINGS Anterior circulation: Petrous segments patent bilaterally without stenosis. Scattered  calcified atheromatous plaque within the cavernous/ supraclinoid left ICA and with mild to moderate multifocal narrowing. Focal calcified plaque within the supraclinoid right ICA with moderate stenosis. ICA termini patent. Right A1 segment patent. Absent/hypoplastic left A1 segment, which likely accounts for the diminutive left ICA is compared to the right. Patent anterior communicating artery. Multifocal atheromatous irregularity within the anterior cerebral arteries bilaterally with moderate to severe multifocal stenoses. M1 segments  demonstrate mild smooth narrowing at their distal aspects bilaterally. MCA bifurcations within normal limits. No proximal M2 occlusion. MCA branches well perfused and symmetric. Small vessel atheromatous irregularity throughout the MCA branches bilaterally. Posterior circulation: Focal plaque within the dominant right V4 segment with mild short-segment stenosis. Right vertebral artery otherwise widely patent. Multifocal atheromatous irregularity within the diminutive left V4 segment with mild to moderate multifocal narrowing. Posterior inferior cerebral arteries patent bilaterally. Basilar artery in patent to its distal aspect without flow-limiting stenosis. Superior cerebellar is patent bilaterally. Both of the posterior cerebral artery supplied via the basilar and are patent to their distal aspects without flow-limiting stenosis. Small vessel atheromatous irregularity within the distal PCAs bilaterally. Venous sinuses: Not well evaluated due to arterial timing of the contrast bolus. Anatomic variants: Hypoplastic/ absent left A1 segment with the anterior cerebral artery supplied via the right carotid artery system. No aneurysm or vascular malformation. Delayed phase: Calcified parafalcine mass with associated right frontal vasogenic edema again noted. Bifrontal encephalomalacia noted as well. No other pathologic enhancement. Review of the MIP images confirms the above findings IMPRESSION: 1. Negative CTA for large or proximal arterial branch occlusion. 2. Moderate intracranial atherosclerotic disease, most evident involving the anterior cerebral arteries bilaterally were there are moderate to severe multifocal stenoses. Moderate carotid siphon atherosclerosis with resultant mild to moderate multifocal narrowing, with mild to moderate bilateral V4 atheromatous changes, left worse than right. 3. Mild carotid bifurcation atherosclerosis without flow-limiting stenosis. Electronically Signed   By: Jeannine Boga  M.D.   On: 12/13/2016 00:10   Ct Head Wo Contrast  Result Date: 12/12/2016 CLINICAL DATA:  51 y/o  M; frontal headaches, weakness in legs. EXAM: CT HEAD WITHOUT CONTRAST TECHNIQUE: Contiguous axial images were obtained from the base of the skull through the vertex without intravenous contrast. COMPARISON:  07/21/2014 CT head.  09/04/2014 MRI head. FINDINGS: Brain: Calcified right medial frontal region mass measuring 4.3 x 2.1 x 2.3 cm (AP x ML x CC), stable from prior CT given differences in slice selection. Hypoattenuation within the adjacent right frontal lobe likely represents associated vasogenic edema and is without significant interval change from prior CT of head. Stable bilateral anterior frontal lobe encephalomalacia. No evidence for new large acute stroke, hemorrhage, or focal mass effect. No hydrocephalus or extra-axial collection Vascular: Dense calcification of paraclinoid internal carotid arteries. Skull: Normal. Negative for fracture or focal lesion. Sinuses/Orbits: Mild ethmoid sinus mucosal thickening. Normal aeration of mastoid air cells. Right external auditory canal opacification, likely cerumen. Orbits are unremarkable. Other: None. IMPRESSION: 1. No acute intracranial abnormality identified. 2. Stable right medial frontal region calcified mass and vasogenic edema in right frontal lobe. 3. Stable bilateral anterior frontal lobe encephalomalacia. 4. Mild ethmoid sinus disease. Electronically Signed   By: Kristine Garbe M.D.   On: 12/12/2016 22:43   Ct Angio Neck W Or Wo Contrast  Result Date: 12/13/2016 CLINICAL DATA:  Initial evaluation for acute left-sided weakness. EXAM: CT ANGIOGRAPHY HEAD AND NECK TECHNIQUE: Multidetector CT imaging of the head and neck was performed using the standard protocol during bolus  administration of intravenous contrast. Multiplanar CT image reconstructions and MIPs were obtained to evaluate the vascular anatomy. Carotid stenosis measurements  (when applicable) are obtained utilizing NASCET criteria, using the distal internal carotid diameter as the denominator. CONTRAST:  55mL ISOVUE-370 IOPAMIDOL (ISOVUE-370) INJECTION 76% COMPARISON:  Prior CT from earlier the same day. FINDINGS: CTA NECK FINDINGS Aortic arch: Visualized aortic arch of normal caliber with normal branch pattern. No flow-limiting stenosis about the origin of the great vessels. Visualized subclavian artery is widely patent. Right carotid system: Right common and internal carotid arteries are widely patent without stenosis, dissection, or occlusion. Minimal atheromatous plaque about the right carotid bifurcation without significant stenosis. Left carotid system: Left common and internal carotid arteries are widely patent without stenosis, dissection, or occlusion. Atheromatous plaque about the left carotid bifurcation with relative mild narrowing of no more than 25% by NASCET criteria. Vertebral arteries: Both of the vertebral arteries arise from the subclavian arteries. Right vertebral artery dominant. Mild-to-moderate stenosis at the origin of the left vertebral artery. Vertebral arteries otherwise patent within the neck without stenosis or occlusion. Skeleton: No acute osseous abnormality. No worrisome lytic or blastic osseous lesions. Other neck: No acute soft tissue abnormality within the neck. Thyroid normal. Salivary glands within normal limits. No adenopathy. Upper chest: Visualized upper chest within normal limits. Partially visualized lungs are clear. Review of the MIP images confirms the above findings CTA HEAD FINDINGS Anterior circulation: Petrous segments patent bilaterally without stenosis. Scattered calcified atheromatous plaque within the cavernous/ supraclinoid left ICA and with mild to moderate multifocal narrowing. Focal calcified plaque within the supraclinoid right ICA with moderate stenosis. ICA termini patent. Right A1 segment patent. Absent/hypoplastic left A1  segment, which likely accounts for the diminutive left ICA is compared to the right. Patent anterior communicating artery. Multifocal atheromatous irregularity within the anterior cerebral arteries bilaterally with moderate to severe multifocal stenoses. M1 segments demonstrate mild smooth narrowing at their distal aspects bilaterally. MCA bifurcations within normal limits. No proximal M2 occlusion. MCA branches well perfused and symmetric. Small vessel atheromatous irregularity throughout the MCA branches bilaterally. Posterior circulation: Focal plaque within the dominant right V4 segment with mild short-segment stenosis. Right vertebral artery otherwise widely patent. Multifocal atheromatous irregularity within the diminutive left V4 segment with mild to moderate multifocal narrowing. Posterior inferior cerebral arteries patent bilaterally. Basilar artery in patent to its distal aspect without flow-limiting stenosis. Superior cerebellar is patent bilaterally. Both of the posterior cerebral artery supplied via the basilar and are patent to their distal aspects without flow-limiting stenosis. Small vessel atheromatous irregularity within the distal PCAs bilaterally. Venous sinuses: Not well evaluated due to arterial timing of the contrast bolus. Anatomic variants: Hypoplastic/ absent left A1 segment with the anterior cerebral artery supplied via the right carotid artery system. No aneurysm or vascular malformation. Delayed phase: Calcified parafalcine mass with associated right frontal vasogenic edema again noted. Bifrontal encephalomalacia noted as well. No other pathologic enhancement. Review of the MIP images confirms the above findings IMPRESSION: 1. Negative CTA for large or proximal arterial branch occlusion. 2. Moderate intracranial atherosclerotic disease, most evident involving the anterior cerebral arteries bilaterally were there are moderate to severe multifocal stenoses. Moderate carotid siphon  atherosclerosis with resultant mild to moderate multifocal narrowing, with mild to moderate bilateral V4 atheromatous changes, left worse than right. 3. Mild carotid bifurcation atherosclerosis without flow-limiting stenosis. Electronically Signed   By: Jeannine Boga M.D.   On: 12/13/2016 00:10   Mr Brain Wo Contrast  Result Date: 12/13/2016  CLINICAL DATA:  LEFT-sided weakness. This began early a.m. 12/12/2016. History of hypertension and diabetes. EXAM: MRI HEAD WITHOUT CONTRAST TECHNIQUE: Multiplanar, multiecho pulse sequences of the brain and surrounding structures were obtained without intravenous contrast. COMPARISON:  CT head and CTA head neck 12/12/2016. MRI brain without with contrast 09/04/2014. FINDINGS: Brain: There is a moderately large brainstem infarct, affecting the lower pons and upper medulla, RIGHT paramedian location, extending from the basis pontis to the floor of the fourth ventricle. No other areas of acute infarction are seen. No hemorrhage, hydrocephalus, or extra-axial fluid. Heavily calcified mass involving the medial RIGHT hemisphere, could be slightly increased from priors. Cross-section of 15 x 37 mm compares with 14 x 34 mm in 2016. A small component of the mass does extend under the falx to the LEFT, as noted previously. Surrounding vasogenic edema is present primarily in the RIGHT hemisphere. No contrast was administered. Concern for calcific glial neoplasm is raised, as the mass does appear to be intra-axial. Marked corpus callosum all atrophy, uncertain etiology. Bifrontal white matter gliosis, in this patient with a history of trauma. Other areas of subcortical and periventricular white matter signal abnormality, probable small vessel disease. Vascular: Normal flow voids. Skull and upper cervical spine: Normal marrow signal. Sinuses/Orbits: Negative. Other: None. IMPRESSION: Acute RIGHT paramedian brainstem infarct affecting lower pons and upper medulla, is responsible  for the reported symptoms. This appears to represent a small vessel insult, as the adjacent major vascular structures appear widely patent. Heavily calcified mass, probably intra-axial, involves the medial RIGHT hemisphere, slightly increased from 2016. Further assessment could be performed with post infusion imaging as clinically indicated. Atrophy and small vessel disease. Electronically Signed   By: Staci Righter M.D.   On: 12/13/2016 07:30   Dg Chest Port 1 View  Result Date: 12/12/2016 CLINICAL DATA:  51 y/o M; sudden weakness in the left leg and left arm. Cough. EXAM: PORTABLE CHEST 1 VIEW COMPARISON:  07/25/2014 chest radiograph FINDINGS: Stable cardiomegaly. Clear lungs. No pleural effusion or pneumothorax. No acute osseous abnormality is evident. IMPRESSION: Stable cardiomegaly.  No acute pulmonary process identified. Electronically Signed   By: Kristine Garbe M.D.   On: 12/12/2016 22:50      Scheduled Meds: . amLODipine  10 mg Oral Daily  . aspirin  300 mg Rectal Daily   Or  . aspirin  325 mg Oral Daily  . atorvastatin  80 mg Oral q1800  . enoxaparin (LOVENOX) injection  40 mg Subcutaneous Q24H  . insulin aspart  0-9 Units Subcutaneous Q4H  . irbesartan  150 mg Oral Daily   Continuous Infusions:   LOS: 1 day    Time spent in minutes: 35    Debbe Odea, MD [facility] Pager: [email or web address] Password [email or web address] 12/13/2016, 8:29 AM

## 2016-12-13 NOTE — ED Notes (Signed)
Attempted report x1. 

## 2016-12-13 NOTE — Progress Notes (Signed)
Patient off unit for MRI.

## 2016-12-13 NOTE — Evaluation (Signed)
Occupational Therapy Evaluation Patient Details Name: [name] MRN: [id number] DOB: 1965-06-17 Today's Date: 12/13/2016    History of Present Illness 51 y.o.malewith medical history significant ofDM, HTN, and a chronic brain mass that's likely a meningioma. Patient presents to the ED with c/o L sided weakness. CT head just shows chronic brain mass with vasogenic edema, essentially unchanged from a year ago. 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   Clinical Impression   Pt admitted with above. He demonstrates the below listed deficits and will benefit from continued OT to maximize safety and independence with BADLs.  Pt presents to OT with Lt hemiparesis, impaired coordination, significant nystagmus, impaired balance, as well as decreased activity tolerance.  Eval was limited to EOB due to elevated BP 188/127 - Pt returned to supine.  Pt requires max A, overall for ADLs, and mod A for bed mobility.  He lives with his wife and daughter, who work during the day.  He reports he was fully independent PTA, and worked part time at [employer].  Anticipate he will need post acute rehab.        Follow Up Recommendations  CIR;Supervision/Assistance - 24 hour    Equipment Recommendations  None recommended by OT    Recommendations for Other Services Rehab consult     Precautions / Restrictions Precautions Precautions: Fall      Mobility Bed Mobility Overal bed mobility: Needs Assistance Bed Mobility: Rolling;Sidelying to Sit;Sit to Sidelying Rolling: Supervision(To Left using bedrail ) Sidelying to sit: Mod assist     Sit to sidelying: Min assist General bed mobility comments: assist to move LEs off bed, to lift trunk and to shift weight to Lt and to balance self   Transfers                 General transfer comment: unable to attempt due to elevated BP     Balance Overall balance assessment: Needs  assistance Sitting-balance support: Feet supported Sitting balance-Leahy Scale: Fair Sitting balance - Comments: pt requires close min guard assist EOB                                    ADL either performed or assessed with clinical judgement   ADL Overall ADL's : Needs assistance/impaired Eating/Feeding: NPO   Grooming: Wash/dry hands;Wash/dry face;Sitting   Upper Body Bathing: Maximal assistance;Sitting;Bed level   Lower Body Bathing: Total assistance;Bed level   Upper Body Dressing : Maximal assistance;Sitting   Lower Body Dressing: Total assistance;Bed level   Toilet Transfer: Total assistance Toilet Transfer Details (indicate cue type and reason): unable to accurately assess  Toileting- Clothing Manipulation and Hygiene: Total assistance;Bed level         General ADL Comments: Pt limited to brief EOB activity due to elevated BP      Vision Baseline Vision/History: Wears glasses Wears Glasses: At all times Patient Visual Report: Blurring of vision Vision Assessment?: Yes Ocular Range of Motion: Within Functional Limits Tracking/Visual Pursuits: Decreased smoothness of horizontal tracking;Decreased smoothness of vertical tracking;Decreased smoothness of eye movement to RIGHT superior field;Decreased smoothness of eye movement to LEFT superior field;Decreased smoothness of eye movement to RIGHT inferior field;Decreased smoothness of eye movement to LEFT inferior field Additional Comments: Pt with horizontal nystagmus with Lt gaze, and vertical nystagumus Rt gaze.  Nystagmus present throughout all movements      Perception  Praxis      Pertinent Vitals/Pain Pain Assessment: No/denies pain     Hand Dominance Right   Extremity/Trunk Assessment Upper Extremity Assessment Upper Extremity Assessment: LUE deficits/detail LUE Deficits / Details: Pt demonstrates movement in Brunnstrom beginning stage IV; hand Brunnstrom stage IV LUE Sensation:  decreased light touch;decreased proprioception LUE Coordination: decreased fine motor;decreased gross motor   Lower Extremity Assessment Lower Extremity Assessment: Defer to PT evaluation   Cervical / Trunk Assessment Cervical / Trunk Assessment: Other exceptions Cervical / Trunk Exceptions: decreased activation of Lt trunk    Communication Communication Communication: No difficulties   Cognition Arousal/Alertness: Awake/alert Behavior During Therapy: Flat affect Overall Cognitive Status: Within Functional Limits for tasks assessed                                 General Comments: Cognition intact for basic info.  Will benefit from further assessment    General Comments  BP sitting EOB 188/127, he was returned to supine - RN notified     Exercises     Shoulder Instructions      Home Living Family/patient expects to be discharged to:: Other (Comment) Living Arrangements: Spouse/significant other Available Help at Discharge: Family Type of Home: House Home Access: Stairs to enter [employer] of Steps: 2 Entrance Stairs-Rails: None Home Layout: One level     Bathroom Shower/Tub: Teacher, early years/pre: Standard     Home Equipment: Environmental consultant - 2 wheels;Cane - single point;Bedside commode;Shower seat;Toilet riser;Grab bars - tub/shower   Additional Comments: Pt reports spouse and daughter work part time       Prior Functioning/Environment Level of Independence: Independent        Comments: 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, corporate Problem List: Decreased strength;Decreased range of motion;Decreased activity tolerance;Impaired balance (sitting and/or standing);Impaired vision/perception;Decreased coordination;Decreased safety awareness;Decreased cognition;Decreased knowledge of use of DME or AE;Impaired sensation;Obesity;Impaired UE functional use      OT  Treatment/Interventions: Self-care/ADL training;Neuromuscular education;DME and/or AE instruction;Therapeutic activities;Patient/family education;Visual/perceptual remediation/compensation;Cognitive remediation/compensation;Balance training    OT Goals(Current goals can be found in the care plan section) Acute Rehab OT Goals Patient Stated Goal: To get better  OT Goal Formulation: With patient Time For Goal Achievement: 12/27/16 Potential to Achieve Goals: Good ADL Goals Pt Will Perform Grooming: with supervision;sitting Pt Will Perform Upper Body Bathing: with supervision;sitting Pt Will Perform Lower Body Bathing: with mod assist;sit to/from stand Pt Will Perform Upper Body Dressing: with min assist;sitting Pt Will Transfer to Toilet: with mod assist;stand pivot transfer;bedside commode Pt Will Perform Toileting - Clothing Manipulation and hygiene: with mod assist;sit to/from stand  OT Frequency: Min 2X/week   Barriers to D/C: Decreased caregiver support  uncertain if family can provide necessary level of assist        Co-evaluation PT/OT/SLP Co-Evaluation/Treatment: Yes Reason for Co-Treatment: Complexity of the patient's impairments (multi-system involvement);For patient/therapist safety;To address functional/ADL transfers   OT goals addressed during session: ADL's and self-care      AM-PAC PT "6 Clicks" Daily Activity     Outcome Measure Help from another person eating meals?: Total Help from another person taking care of personal grooming?: A Little Help from another person toileting, which includes using toliet, bedpan, or urinal?: Total Help from another person bathing (including washing, rinsing, drying)?: A Lot Help from another person  to put on and taking off regular upper body clothing?: A Lot Help from another person to put on and taking off regular lower body clothing?: Total 6 Click Score: 10   End of Session Nurse Communication: Mobility status;Other  (comment)(elevated BP )  Activity Tolerance: Treatment limited secondary to medical complications (Comment) Patient left: in bed;with call bell/phone within reach;with bed alarm set  OT Visit Diagnosis: Ataxia, unspecified (R27.0);Hemiplegia and hemiparesis Hemiplegia - Right/Left: Left Hemiplegia - dominant/non-dominant: Non-Dominant Hemiplegia - caused by: Cerebral infarction                Time: 0981-1914 OT Time Calculation (min): 19 min Charges:  OT General Charges $OT Visit: 1 Visit OT Evaluation $OT Eval Moderate Complexity: 1 Mod G-Codes:     [facility], OTR/L [phone]   Lucille Passy M 12/13/2016, 11:21 AM

## 2016-12-13 NOTE — Progress Notes (Addendum)
STROKE TEAM PROGRESS NOTE   HISTORY OF PRESENT ILLNESS (per record) [name] is a 51 y.o. male who was last seen normal when he went to bed on 11/29, he awoke at 1 AM with left-sided weakness.  He went back to sleep, and when he awoke at 1 3 AM it was still present.  Over the course of the day, he feels that it is been stable to may be slightly improved, but still very much troubling to him.  He therefore has sought further care in the emergency department this evening.  Also, he has had severe hiccups over the course of the day.  LKW: 11/29 prior to bed tpa given?: no, out of window    SUBJECTIVE (INTERVAL HISTORY) No family members present. The patient still has diplopia seeing one image above the other. He reports that he fell twice yesterday prior to admission.   OBJECTIVE Temp:  [98.4 F (36.9 C)-98.6 F (37 C)] 98.4 F (36.9 C) (12/01 0604) Pulse Rate:  [82-133] 84 (12/01 0604) Cardiac Rhythm: Normal sinus rhythm (12/01 0218) Resp:  [19-55] 24 (12/01 0604) BP: (181-240)/(101-146) 192/117 (12/01 0605) SpO2:  [1 %-99 %] 94 % (12/01 0604) FiO2 (%):  [21 %] 21 % (11/30 2344) Weight:  [220 lb (99.8 kg)] 220 lb (99.8 kg) (11/30 2128)  CBC:  Recent Labs  Lab 12/12/16 2148  WBC 6.5  HGB 13.3  HCT 39.4  MCV 74.6*  PLT 481    Basic Metabolic Panel:  Recent Labs  Lab 12/12/16 2148  NA 140  K 3.4*  CL 106  CO2 22  GLUCOSE 132*  BUN 12  CREATININE 1.21  CALCIUM 9.8    Lipid Panel:     Component Value Date/Time   CHOL 227 (H) 12/13/2016 0421   TRIG 77 12/13/2016 0421   HDL 58 12/13/2016 0421   CHOLHDL 3.9 12/13/2016 0421   VLDL 15 12/13/2016 0421   LDLCALC 154 (H) 12/13/2016 0421   HgbA1c:  Lab Results  Component Value Date   HGBA1C 6.2 (H) 12/13/2016   Urine Drug Screen:     Component Value Date/Time   LABOPIA NONE DETECTED 12/13/2016 0133   COCAINSCRNUR NONE DETECTED 12/13/2016 0133   LABBENZ NONE DETECTED 12/13/2016 0133   AMPHETMU NONE  DETECTED 12/13/2016 0133   THCU NONE DETECTED 12/13/2016 0133   LABBARB NONE DETECTED 12/13/2016 0133    Alcohol Level     Component Value Date/Time   ETH <5 07/21/2014 2130    IMAGING   Ct Angio Head W Or Wo Contrast Ct Angio Neck W Or Wo Contrast 12/13/2016 IMPRESSION:  1. Negative CTA for large or proximal arterial branch occlusion.  2. Moderate intracranial atherosclerotic disease, most evident involving the anterior cerebral arteries bilaterally were there are moderate to severe multifocal stenoses. Moderate carotid siphon atherosclerosis with resultant mild to moderate multifocal narrowing, with mild to moderate bilateral V4 atheromatous changes, left worse than right.  3. Mild carotid bifurcation atherosclerosis without flow-limiting stenosis.    Ct Head Wo Contrast 12/12/2016 IMPRESSION:  1. No acute intracranial abnormality identified.  2. Stable right medial frontal region calcified mass and vasogenic edema in right frontal lobe.  3. Stable bilateral anterior frontal lobe encephalomalacia.  4. Mild ethmoid sinus disease.     Mr Brain Wo Contrast 12/13/2016 IMPRESSION:  Acute RIGHT paramedian brainstem infarct affecting lower pons and upper medulla, is responsible for the reported symptoms. This appears to represent a small vessel insult, as the adjacent major vascular  structures appear widely patent. Heavily calcified mass, probably intra-axial, involves the medial RIGHT hemisphere, slightly increased from 2016. Further assessment could be performed with post infusion imaging as clinically indicated. Atrophy and small vessel disease.    Dg Chest Port 1 View 12/12/2016 IMPRESSION:  Stable cardiomegaly.  No acute pulmonary process identified.     Transthoracic Echocardiogram - pending 12/13/2016      PHYSICAL EXAM Vitals:   12/13/16 0218 12/13/16 0408 12/13/16 0604 12/13/16 0605  BP: (!) 196/124 (!) 181/114 (!) 194/108 (!) 192/117  Pulse: 92 82 84    Resp: (!) 24 (!) 26 (!) 24   Temp: 98.6 F (37 C) 98.6 F (37 C) 98.4 F (36.9 C)   TempSrc: Oral Oral    SpO2: 94% 95% 94%   Weight:      Height:       Pleasant middle-aged African-American male currently not in distress. . Afebrile. Head is nontraumatic. Neck is supple without bruit.    Cardiac exam no murmur or gallop. Lungs are clear to auscultation. Distal pulses are well felt. Neurological Exam ; Awake alert oriented x 3 normal speech and language. Extraocular moments show mild right sixth nerve palsy. Saccadic dysmetria on lateral gaze bilaterally. Mild left lower face asymmetry. Tongue midline. Mild left upper and lower extremity drift. Left hemiparesis 4/5 Mild diminished fine finger movements on left. Orbits right over left upper extremity. Mild left grip weak.. Normal sensation . Normal coordination. Gait deferred    ASSESSMENT/PLAN [name] is a 52 y.o. male with history of hypertension, seizure disorder, diabetes mellitus, and history of colon cancer presenting with left-sided weakness and diplopia. He did not receive IV t-PA due to late presentation.  Stroke:  Brainstem infarct secondary to small vessel disease.  Resultant diplopia and left hemiparesis  CT head - Stable right medial frontal region calcified mass and vasogenic edema in right frontal lobe.  MRI head - Acute RIGHT paramedian brainstem infarct affecting lower pons and upper medulla  MRA head - not performed  CTA H&N - Moderate intracranial atherosclerotic disease (see details above)  Carotid Doppler - CTA neck  2D Echo - pending  LDL - 154  HgbA1c - 6.2  VTE prophylaxis - Lovenox Diet NPO time specified  No antithrombotic prior to admission, now on aspirin 325 mg daily    Patient counseled to be compliant with his antithrombotic medications  Ongoing aggressive stroke risk factor management  Therapy recommendations: CIR recommended  Disposition:   Pending  Hypertension  Stable - but high  Permissive hypertension (OK if < 220/120) but gradually normalize in 5-7 days  Long-term BP goal normotensive  Hyperlipidemia  Home meds:  No lipid lowering medications prior to admission  LDL 154, goal < 70  Now on Lipitor 80 mg daily  Continue statin at discharge  Diabetes  HgbA1c 6.2, goal < 7.0  Controlled  Other Stroke Risk Factors  ETOH use, advised to drink no more than 1 drink per day.  Obesity, Body mass index is 38.97 kg/m., recommend weight loss, diet and exercise as appropriate   Family hx stroke (father)  History of colon cancer  Other Active Problems  Hypokalemia - 3.4 -> supplement   Plan / Recommendations  Aspirin 325 mg daily   Moderate intracranial atherosclerotic disease (see details above)  Possible CIR admission  Lipitor 80 mg daily  Follow-up with Dr. Leonie Man after discharge    Hospital day # [place] PA-C [facility] Pager [phone] 12/13/2016, 2:06 PM I have personally examined this patient, reviewed notes, independently viewed imaging studies, participated in medical decision making and plan of care.ROS completed by me personally and pertinent positives fully documented  I have made any additions or clarifications directly to the above note. Agree with note above. He presented with diplopia, gait ataxia and left sided weakness secondary to brainstem and cerebellar infarcts etiology likely small vessel disease though he does have intracranial atherosclerosis as well. Recommend Aspirin  and aggressive risk factor modification. He would likely need rehabilitation. Discussion with patient and Dr. Wynelle Cleveland and answered questions. Greater than 50% time during this 35 minute visit was spent on counseling and coordination of care about his stroke and answering questions.  Antony Contras, MD Medical Director [facility] Pager: [phone] 12/13/2016 2:43  PM    To contact Stroke Continuity provider, please refer to [email or web address] After hours, contact General Neurology

## 2016-12-13 NOTE — Progress Notes (Signed)
  Echocardiogram 2D Echocardiogram has been performed.  Asim Gersten T Eunice Oldaker 12/13/2016, 1:03 PM

## 2016-12-13 NOTE — Evaluation (Signed)
Clinical/Bedside Swallow Evaluation Patient Details  Name: [name] MRN: [id number] Date of Birth: 1965-03-19  Today's Date: 12/13/2016 Time: SLP Start Time (ACUTE ONLY): 1045 SLP Stop Time (ACUTE ONLY): 1059 SLP Time Calculation (min) (ACUTE ONLY): 14 min  Past Medical History:  Past Medical History:  Diagnosis Date  . Colon cancer ([place])    rectal  . Diabetes mellitus without complication ([place])   . Hypertension   . Seizure [facility])    Past Surgical History:  Past Surgical History:  Procedure Laterality Date  . APPENDECTOMY    . JOINT REPLACEMENT Left 2004   Left hip  . TOTAL HIP ARTHROPLASTY     HPI:  Mark Carden Whitakeris a 51 y.o.malewith medical history significant ofDM, HTN, and a chronic brain mass that's likely a meningioma. Patient presents to the ED with c/o L sided weakness. LKW when he went to bed last night. Woke up at 1AM with weakness. Went back to sleep. Still present when he woke up again in AM. MRI 12/13/16 showed Acute RIGHT paramedian brainstem infarct affecting lower pons and upper medulla.Heavily calcified mass, probably intra-axial, involves the medial RIGHT hemisphere, slightly increased from 2016.   Assessment / Plan / Recommendation Clinical Impression  Patient presents with signs of pharyngeal dysphagia and reduced airway protection with thin liquids (cup and straw) and nectar thick liquids via straw. Immediate, strong coughing x2, and consistent throat clearing after thin liquids via straw and cup; suspect delayed swallow initiation leading to airway compromise. No coughing with nectar thick liquids, though pt does have immediate throat clearing with multiple sips via straw. Recommend initiating dys 3 with nectar thick liquids, no straw, meds whole in puree. SLP will follow for tolerance, possible MBS if warranted. Will f/u for cognitive linguistic evaluation.  SLP Visit Diagnosis: Dysphagia, unspecified (R13.10)    Aspiration Risk  Mild  aspiration risk;Moderate aspiration risk    Diet Recommendation Dysphagia 3 (Mech soft);Nectar-thick liquid   Liquid Administration via: Cup;No straw Medication Administration: Whole meds with puree Supervision: Patient able to self feed;Intermittent supervision to cue for compensatory strategies Compensations: Slow rate;Small sips/bites;Minimize environmental distractions Postural Changes: Seated upright at 90 degrees    Other  Recommendations Oral Care Recommendations: Oral care BID Other Recommendations: Order thickener from pharmacy;Clarify dietary restrictions   Follow up Recommendations Inpatient Rehab      Frequency and Duration min 2x/week  2 weeks       Prognosis Prognosis for Safe Diet Advancement: Good      Swallow Study   General Date of Onset: 12/12/16 HPI: Mark Haro Whitakeris a 51 y.o.malewith medical history significant ofDM, HTN, and a chronic brain mass that's likely a meningioma. Patient presents to the ED with c/o L sided weakness. LKW when he went to bed last night. Woke up at 1AM with weakness. Went back to sleep. Still present when he woke up again in AM. MRI 12/13/16 showed Acute RIGHT paramedian brainstem infarct affecting lower pons and upper medulla.Heavily calcified mass, probably intra-axial, involves the medial RIGHT hemisphere, slightly increased from 2016. Type of Study: Bedside Swallow Evaluation Previous Swallow Assessment: none in chart Diet Prior to this Study: NPO Temperature Spikes Noted: No Respiratory Status: Room air History of Recent Intubation: No Behavior/Cognition: Alert;Cooperative Oral Cavity Assessment: Within Functional Limits Oral Care Completed by SLP: Yes Oral Cavity - Dentition: Adequate natural dentition Vision: Functional for self-feeding Self-Feeding Abilities: Able to feed self;Needs set up Patient Positioning: Upright in bed Baseline Vocal Quality: Normal Volitional Cough: Strong Volitional  Swallow: Able to  elicit    Oral/Motor/Sensory Function Overall Oral Motor/Sensory Function: Within functional limits   Ice Chips Ice chips: Within functional limits Presentation: Spoon;Self Fed   Thin Liquid Thin Liquid: Impaired Presentation: Cup;Straw;Self Fed Pharyngeal  Phase Impairments: Suspected delayed Swallow;Cough - Immediate;Cough - Delayed;Throat Clearing - Immediate    Nectar Thick Nectar Thick Liquid: Impaired Presentation: Cup;Straw Pharyngeal Phase Impairments: Throat Clearing - Immediate   Honey Thick Honey Thick Liquid: Not tested   Puree Puree: Within functional limits Presentation: Self Fed;Spoon   Solid   GO   Solid: Within functional limits Presentation: Self Fed;Spoon       [name], [place], [place] Speech-Language Pathologist [phone]  [name] 12/13/2016,11:16 AM

## 2016-12-13 NOTE — Progress Notes (Signed)
Patient received to unit and made comfortable in bed. Patient awake , alert and oriented x4. Plan of care reviewed with patient. Safety precautions maintained. Call bell placed within reach of patient.

## 2016-12-14 DIAGNOSIS — E876 Hypokalemia: Secondary | ICD-10-CM

## 2016-12-14 LAB — GLUCOSE, CAPILLARY
GLUCOSE-CAPILLARY: 120 mg/dL — AB (ref 65–99)
GLUCOSE-CAPILLARY: 131 mg/dL — AB (ref 65–99)
GLUCOSE-CAPILLARY: 134 mg/dL — AB (ref 65–99)
Glucose-Capillary: 128 mg/dL — ABNORMAL HIGH (ref 65–99)
Glucose-Capillary: 128 mg/dL — ABNORMAL HIGH (ref 65–99)
Glucose-Capillary: 120 mg/dL — ABNORMAL HIGH (ref 65–99)

## 2016-12-14 MED ORDER — CARVEDILOL 12.5 MG PO TABS
25.0000 mg | ORAL_TABLET | Freq: Two times a day (BID) | ORAL | Status: DC
  Administered 2016-12-14 – 2016-12-18 (×8): 25 mg via ORAL
  Filled 2016-12-14 (×8): qty 2

## 2016-12-14 MED ORDER — RESOURCE THICKENUP CLEAR PO POWD
ORAL | Status: DC | PRN
  Filled 2016-12-14: qty 125

## 2016-12-14 MED ORDER — LABETALOL HCL 100 MG PO TABS
100.0000 mg | ORAL_TABLET | Freq: Two times a day (BID) | ORAL | Status: DC
  Administered 2016-12-14: 100 mg via ORAL
  Filled 2016-12-14: qty 1

## 2016-12-14 MED ORDER — HYDROCHLOROTHIAZIDE 25 MG PO TABS: 25.0000 mg | ORAL_TABLET | Freq: Every day | ORAL | Status: DC

## 2016-12-14 NOTE — Progress Notes (Signed)
SLP Cancellation Note  Patient Details Name: [name] MRN: [id number] DOB: October 17, 1965   Cancelled treatment:       Reason Eval/Treat Not Completed: Patient at procedure or test/unavailable.  Patient was having a bath.  ST will continue efforts to see the patient.    Shelly Flatten, MA, [place] [facility] SLP [phone]  Lamar Sprinkles 12/14/2016, 10:52 AM

## 2016-12-14 NOTE — Progress Notes (Signed)
PROGRESS NOTE    [name]   NOM:[id number]  DOB: [birth year]  DOA: 12/12/2016 PCP: Mark Chard, MD   Brief Narrative:  [name] 51 y.o. male with medical history significant of DM, HTN, and a chronic brain mass that's likely a meningioma.  Patient presents to the ED with c/o L sided weakness. BP 240/140  Subjective: Continues to have left sided weakness which is worse from yesterday. ROS: no complaints of nausea, vomiting, constipation diarrhea, cough, dyspnea or dysuria. No other complaints.   Assessment & Plan:   Principal Problem:   Acute ischemic stroke   - symptoms of left sided weakness and diplopia - ? Due to HTN- admits that he ran out of his Clonidine and ARB a few months ago - CT head: Stable right medial frontal region calcified mass and vasogenic edema in right frontal lobe. Stable bilateral anterior frontal lobe encephalomalacia. CTA: Moderate intracranial atherosclerotic disease MRI brain: Acute RIGHT paramedian brainstem infarct affecting lower pons and upper medulla... Heavily calcified mass, probably intra-axial, involves the medial RIGHT hemisphere, slightly increased from 2016. - Lipids: Cholesterol 227, LDL 154, HDL 58- Lipitor started - A1c: 6.2 - based on extent of weakness, he will need need CIR or SNF    Active Problems: Calcified mass in brain - chronic, appears to be increasing in size based on report- neuro to give further recommendations   Hypertension - severe - on Amlodipine at home- ran out of Valsartan and Clonidine  -12/1 resumed Irbesartan - 12/2- start Coreg today  HLD - see above- Lipitor started  Proteinuria - ? Due to HTN     DM2 (diabetes mellitus, type 2)  - A1c controlled at 6.2- takes Janumet- cont SSI   DVT prophylaxis: Lovenox Code Status: Full code Family Communication:  Disposition Plan: follow on tele Consultants:   neuro Procedures:    Antimicrobials:  Anti-infectives (From admission,  onward)   None       Objective: Vitals:   12/14/16 0205 12/14/16 0629 12/14/16 0900 12/14/16 1407  BP: (!) 177/112 (!) 176/116 (!) 170/87 (!) 141/102  Pulse: 99 91 96 99  Resp: 20 20  20   Temp: 99.1 F (37.3 C) 98.1 F (36.7 C) 97.8 F (36.6 C) 98.5 F (36.9 C)  TempSrc: Oral Oral Oral Oral  SpO2: 94% 96% 96% 93%  Weight:      Height:        Intake/Output Summary (Last 24 hours) at 12/14/2016 1439 Last data filed at 12/14/2016 0630 Gross per 24 hour  Intake -  Output 400 ml  Net -400 ml   Filed Weights   12/12/16 2128  Weight: 99.8 kg (220 lb)    Examination: General exam: Appears comfortable  HEENT: PERRLA, oral mucosa moist, no sclera icterus or thrush Respiratory system: Clear to auscultation. Respiratory effort normal. Cardiovascular system: S1 & S2 heard, RRR.  No murmurs  Gastrointestinal system: Abdomen soft, non-tender, nondistended. Normal bowel sound. No organomegaly Central nervous system: Alert and oriented. Blurred vision and diplopia- left arm and leg 1/5 strength Extremities: No cyanosis, clubbing or edema Skin: No rashes or ulcers Psychiatry:  Mood & affect appropriate.     Data Reviewed: I have personally reviewed following labs and imaging studies  CBC: Recent Labs  Lab 12/12/16 2148  WBC 6.5  HGB 13.3  HCT 39.4  MCV 74.6*  PLT 962   Basic Metabolic Panel: Recent Labs  Lab 12/12/16 2148  NA 140  K 3.4*  CL 106  CO2 22  GLUCOSE 132*  BUN 12  CREATININE 1.21  CALCIUM 9.8   GFR: Estimated Creatinine Clearance: 76.5 mL/min (by C-G formula based on SCr of 1.21 mg/dL). Liver Function Tests: No results for input(s): AST, ALT, ALKPHOS, BILITOT, PROT, ALBUMIN in the last 168 hours. No results for input(s): LIPASE, AMYLASE in the last 168 hours. No results for input(s): AMMONIA in the last 168 hours. Coagulation Profile: No results for input(s): INR, PROTIME in the last 168 hours. Cardiac Enzymes: No results for input(s):  CKTOTAL, CKMB, CKMBINDEX, TROPONINI in the last 168 hours. BNP (last 3 results) No results for input(s): PROBNP in the last 8760 hours. HbA1C: Recent Labs    12/13/16 0421  HGBA1C 6.2*   CBG: Recent Labs  Lab 12/13/16 2015 12/14/16 0103 12/14/16 0420 12/14/16 0659 12/14/16 1131  GLUCAP 147* 120* 131* 128* 128*   Lipid Profile: Recent Labs    12/13/16 0421  CHOL 227*  HDL 58  LDLCALC 154*  TRIG 77  CHOLHDL 3.9   Thyroid Function Tests: No results for input(s): TSH, T4TOTAL, FREET4, T3FREE, THYROIDAB in the last 72 hours. Anemia Panel: No results for input(s): VITAMINB12, FOLATE, FERRITIN, TIBC, IRON, RETICCTPCT in the last 72 hours. Urine analysis:    Component Value Date/Time   COLORURINE YELLOW 12/13/2016 0133   APPEARANCEUR CLEAR 12/13/2016 0133   LABSPEC 1.018 12/13/2016 0133   PHURINE 5.0 12/13/2016 0133   GLUCOSEU 50 (A) 12/13/2016 0133   HGBUR MODERATE (A) 12/13/2016 0133   BILIRUBINUR NEGATIVE 12/13/2016 0133   KETONESUR NEGATIVE 12/13/2016 0133   PROTEINUR >=300 (A) 12/13/2016 0133   UROBILINOGEN 0.2 07/25/2014 1459   NITRITE NEGATIVE 12/13/2016 0133   LEUKOCYTESUR NEGATIVE 12/13/2016 0133   Sepsis Labs: @LABRCNTIP (procalcitonin:4,lacticidven:4) )No results found for this or any previous visit (from the past 240 hour(s)).       Radiology Studies: Ct Angio Head W Or Wo Contrast  Result Date: 12/13/2016 CLINICAL DATA:  Initial evaluation for acute left-sided weakness. EXAM: CT ANGIOGRAPHY HEAD AND NECK TECHNIQUE: Multidetector CT imaging of the head and neck was performed using the standard protocol during bolus administration of intravenous contrast. Multiplanar CT image reconstructions and MIPs were obtained to evaluate the vascular anatomy. Carotid stenosis measurements (when applicable) are obtained utilizing NASCET criteria, using the distal internal carotid diameter as the denominator. CONTRAST:  74mL ISOVUE-370 IOPAMIDOL (ISOVUE-370) INJECTION  76% COMPARISON:  Prior CT from earlier the same day. FINDINGS: CTA NECK FINDINGS Aortic arch: Visualized aortic arch of normal caliber with normal branch pattern. No flow-limiting stenosis about the origin of the great vessels. Visualized subclavian artery is widely patent. Right carotid system: Right common and internal carotid arteries are widely patent without stenosis, dissection, or occlusion. Minimal atheromatous plaque about the right carotid bifurcation without significant stenosis. Left carotid system: Left common and internal carotid arteries are widely patent without stenosis, dissection, or occlusion. Atheromatous plaque about the left carotid bifurcation with relative mild narrowing of no more than 25% by NASCET criteria. Vertebral arteries: Both of the vertebral arteries arise from the subclavian arteries. Right vertebral artery dominant. Mild-to-moderate stenosis at the origin of the left vertebral artery. Vertebral arteries otherwise patent within the neck without stenosis or occlusion. Skeleton: No acute osseous abnormality. No worrisome lytic or blastic osseous lesions. Other neck: No acute soft tissue abnormality within the neck. Thyroid normal. Salivary glands within normal limits. No adenopathy. Upper chest: Visualized upper chest within normal limits. Partially visualized lungs are clear. Review of the MIP images confirms  the above findings CTA HEAD FINDINGS Anterior circulation: Petrous segments patent bilaterally without stenosis. Scattered calcified atheromatous plaque within the cavernous/ supraclinoid left ICA and with mild to moderate multifocal narrowing. Focal calcified plaque within the supraclinoid right ICA with moderate stenosis. ICA termini patent. Right A1 segment patent. Absent/hypoplastic left A1 segment, which likely accounts for the diminutive left ICA is compared to the right. Patent anterior communicating artery. Multifocal atheromatous irregularity within the anterior  cerebral arteries bilaterally with moderate to severe multifocal stenoses. M1 segments demonstrate mild smooth narrowing at their distal aspects bilaterally. MCA bifurcations within normal limits. No proximal M2 occlusion. MCA branches well perfused and symmetric. Small vessel atheromatous irregularity throughout the MCA branches bilaterally. Posterior circulation: Focal plaque within the dominant right V4 segment with mild short-segment stenosis. Right vertebral artery otherwise widely patent. Multifocal atheromatous irregularity within the diminutive left V4 segment with mild to moderate multifocal narrowing. Posterior inferior cerebral arteries patent bilaterally. Basilar artery in patent to its distal aspect without flow-limiting stenosis. Superior cerebellar is patent bilaterally. Both of the posterior cerebral artery supplied via the basilar and are patent to their distal aspects without flow-limiting stenosis. Small vessel atheromatous irregularity within the distal PCAs bilaterally. Venous sinuses: Not well evaluated due to arterial timing of the contrast bolus. Anatomic variants: Hypoplastic/ absent left A1 segment with the anterior cerebral artery supplied via the right carotid artery system. No aneurysm or vascular malformation. Delayed phase: Calcified parafalcine mass with associated right frontal vasogenic edema again noted. Bifrontal encephalomalacia noted as well. No other pathologic enhancement. Review of the MIP images confirms the above findings IMPRESSION: 1. Negative CTA for large or proximal arterial branch occlusion. 2. Moderate intracranial atherosclerotic disease, most evident involving the anterior cerebral arteries bilaterally were there are moderate to severe multifocal stenoses. Moderate carotid siphon atherosclerosis with resultant mild to moderate multifocal narrowing, with mild to moderate bilateral V4 atheromatous changes, left worse than right. 3. Mild carotid bifurcation  atherosclerosis without flow-limiting stenosis. Electronically Signed   By: Jeannine Boga M.D.   On: 12/13/2016 00:10   Ct Head Wo Contrast  Result Date: 12/12/2016 CLINICAL DATA:  51 y/o  M; frontal headaches, weakness in legs. EXAM: CT HEAD WITHOUT CONTRAST TECHNIQUE: Contiguous axial images were obtained from the base of the skull through the vertex without intravenous contrast. COMPARISON:  07/21/2014 CT head.  09/04/2014 MRI head. FINDINGS: Brain: Calcified right medial frontal region mass measuring 4.3 x 2.1 x 2.3 cm (AP x ML x CC), stable from prior CT given differences in slice selection. Hypoattenuation within the adjacent right frontal lobe likely represents associated vasogenic edema and is without significant interval change from prior CT of head. Stable bilateral anterior frontal lobe encephalomalacia. No evidence for new large acute stroke, hemorrhage, or focal mass effect. No hydrocephalus or extra-axial collection Vascular: Dense calcification of paraclinoid internal carotid arteries. Skull: Normal. Negative for fracture or focal lesion. Sinuses/Orbits: Mild ethmoid sinus mucosal thickening. Normal aeration of mastoid air cells. Right external auditory canal opacification, likely cerumen. Orbits are unremarkable. Other: None. IMPRESSION: 1. No acute intracranial abnormality identified. 2. Stable right medial frontal region calcified mass and vasogenic edema in right frontal lobe. 3. Stable bilateral anterior frontal lobe encephalomalacia. 4. Mild ethmoid sinus disease. Electronically Signed   By: Kristine Garbe M.D.   On: 12/12/2016 22:43   Ct Angio Neck W Or Wo Contrast  Result Date: 12/13/2016 CLINICAL DATA:  Initial evaluation for acute left-sided weakness. EXAM: CT ANGIOGRAPHY HEAD AND NECK TECHNIQUE: Multidetector  CT imaging of the head and neck was performed using the standard protocol during bolus administration of intravenous contrast. Multiplanar CT image  reconstructions and MIPs were obtained to evaluate the vascular anatomy. Carotid stenosis measurements (when applicable) are obtained utilizing NASCET criteria, using the distal internal carotid diameter as the denominator. CONTRAST:  79mL ISOVUE-370 IOPAMIDOL (ISOVUE-370) INJECTION 76% COMPARISON:  Prior CT from earlier the same day. FINDINGS: CTA NECK FINDINGS Aortic arch: Visualized aortic arch of normal caliber with normal branch pattern. No flow-limiting stenosis about the origin of the great vessels. Visualized subclavian artery is widely patent. Right carotid system: Right common and internal carotid arteries are widely patent without stenosis, dissection, or occlusion. Minimal atheromatous plaque about the right carotid bifurcation without significant stenosis. Left carotid system: Left common and internal carotid arteries are widely patent without stenosis, dissection, or occlusion. Atheromatous plaque about the left carotid bifurcation with relative mild narrowing of no more than 25% by NASCET criteria. Vertebral arteries: Both of the vertebral arteries arise from the subclavian arteries. Right vertebral artery dominant. Mild-to-moderate stenosis at the origin of the left vertebral artery. Vertebral arteries otherwise patent within the neck without stenosis or occlusion. Skeleton: No acute osseous abnormality. No worrisome lytic or blastic osseous lesions. Other neck: No acute soft tissue abnormality within the neck. Thyroid normal. Salivary glands within normal limits. No adenopathy. Upper chest: Visualized upper chest within normal limits. Partially visualized lungs are clear. Review of the MIP images confirms the above findings CTA HEAD FINDINGS Anterior circulation: Petrous segments patent bilaterally without stenosis. Scattered calcified atheromatous plaque within the cavernous/ supraclinoid left ICA and with mild to moderate multifocal narrowing. Focal calcified plaque within the supraclinoid right  ICA with moderate stenosis. ICA termini patent. Right A1 segment patent. Absent/hypoplastic left A1 segment, which likely accounts for the diminutive left ICA is compared to the right. Patent anterior communicating artery. Multifocal atheromatous irregularity within the anterior cerebral arteries bilaterally with moderate to severe multifocal stenoses. M1 segments demonstrate mild smooth narrowing at their distal aspects bilaterally. MCA bifurcations within normal limits. No proximal M2 occlusion. MCA branches well perfused and symmetric. Small vessel atheromatous irregularity throughout the MCA branches bilaterally. Posterior circulation: Focal plaque within the dominant right V4 segment with mild short-segment stenosis. Right vertebral artery otherwise widely patent. Multifocal atheromatous irregularity within the diminutive left V4 segment with mild to moderate multifocal narrowing. Posterior inferior cerebral arteries patent bilaterally. Basilar artery in patent to its distal aspect without flow-limiting stenosis. Superior cerebellar is patent bilaterally. Both of the posterior cerebral artery supplied via the basilar and are patent to their distal aspects without flow-limiting stenosis. Small vessel atheromatous irregularity within the distal PCAs bilaterally. Venous sinuses: Not well evaluated due to arterial timing of the contrast bolus. Anatomic variants: Hypoplastic/ absent left A1 segment with the anterior cerebral artery supplied via the right carotid artery system. No aneurysm or vascular malformation. Delayed phase: Calcified parafalcine mass with associated right frontal vasogenic edema again noted. Bifrontal encephalomalacia noted as well. No other pathologic enhancement. Review of the MIP images confirms the above findings IMPRESSION: 1. Negative CTA for large or proximal arterial branch occlusion. 2. Moderate intracranial atherosclerotic disease, most evident involving the anterior cerebral arteries  bilaterally were there are moderate to severe multifocal stenoses. Moderate carotid siphon atherosclerosis with resultant mild to moderate multifocal narrowing, with mild to moderate bilateral V4 atheromatous changes, left worse than right. 3. Mild carotid bifurcation atherosclerosis without flow-limiting stenosis. Electronically Signed   By: Pincus Badder.D.  On: 12/13/2016 00:10   Mr Brain Wo Contrast  Result Date: 12/13/2016 CLINICAL DATA:  LEFT-sided weakness. This began early a.m. 12/12/2016. History of hypertension and diabetes. EXAM: MRI HEAD WITHOUT CONTRAST TECHNIQUE: Multiplanar, multiecho pulse sequences of the brain and surrounding structures were obtained without intravenous contrast. COMPARISON:  CT head and CTA head neck 12/12/2016. MRI brain without with contrast 09/04/2014. FINDINGS: Brain: There is a moderately large brainstem infarct, affecting the lower pons and upper medulla, RIGHT paramedian location, extending from the basis pontis to the floor of the fourth ventricle. No other areas of acute infarction are seen. No hemorrhage, hydrocephalus, or extra-axial fluid. Heavily calcified mass involving the medial RIGHT hemisphere, could be slightly increased from priors. Cross-section of 15 x 37 mm compares with 14 x 34 mm in 2016. A small component of the mass does extend under the falx to the LEFT, as noted previously. Surrounding vasogenic edema is present primarily in the RIGHT hemisphere. No contrast was administered. Concern for calcific glial neoplasm is raised, as the mass does appear to be intra-axial. Marked corpus callosum all atrophy, uncertain etiology. Bifrontal white matter gliosis, in this patient with a history of trauma. Other areas of subcortical and periventricular white matter signal abnormality, probable small vessel disease. Vascular: Normal flow voids. Skull and upper cervical spine: Normal marrow signal. Sinuses/Orbits: Negative. Other: None. IMPRESSION: Acute  RIGHT paramedian brainstem infarct affecting lower pons and upper medulla, is responsible for the reported symptoms. This appears to represent a small vessel insult, as the adjacent major vascular structures appear widely patent. Heavily calcified mass, probably intra-axial, involves the medial RIGHT hemisphere, slightly increased from 2016. Further assessment could be performed with post infusion imaging as clinically indicated. Atrophy and small vessel disease. Electronically Signed   By: Staci Righter M.D.   On: 12/13/2016 07:30   Dg Chest Port 1 View  Result Date: 12/12/2016 CLINICAL DATA:  51 y/o M; sudden weakness in the left leg and left arm. Cough. EXAM: PORTABLE CHEST 1 VIEW COMPARISON:  07/25/2014 chest radiograph FINDINGS: Stable cardiomegaly. Clear lungs. No pleural effusion or pneumothorax. No acute osseous abnormality is evident. IMPRESSION: Stable cardiomegaly.  No acute pulmonary process identified. Electronically Signed   By: Kristine Garbe M.D.   On: 12/12/2016 22:50      Scheduled Meds: . amLODipine  10 mg Oral Daily  . aspirin  300 mg Rectal Daily   Or  . aspirin  325 mg Oral Daily  . atorvastatin  80 mg Oral q1800  . carvedilol  25 mg Oral BID WC  . enoxaparin (LOVENOX) injection  40 mg Subcutaneous Q24H  . insulin aspart  0-9 Units Subcutaneous TID WC  . irbesartan  150 mg Oral Daily   Continuous Infusions:   LOS: 2 days    Time spent in minutes: 35    Mark Odea, MD [facility] Pager: [email or web address] Password [facility] 12/14/2016, 2:39 PM

## 2016-12-14 NOTE — Evaluation (Signed)
Speech Language Pathology Evaluation Patient Details Name: [name] MRN: [id number] DOB: Jan 23, 1965 Today's Date: 12/14/2016 Time: 7412-8786 SLP Time Calculation (min) (ACUTE ONLY): 20 min  Problem List:  Patient Active Problem List   Diagnosis Date Noted  . Acute ischemic stroke ([place]) 12/12/2016  . Acute respiratory failure with hypercapnia ([place])   . Seizures ([place]) 07/22/2014  . Hypertension 07/22/2014  . Hyperglycemia 07/22/2014  . DM2 (diabetes mellitus, type 2) ([place]) 07/22/2014  . Pyrexia   . Brain mass   . Malignant neoplasm of rectum ([place]) 09/02/2007   Past Medical History:  Past Medical History:  Diagnosis Date  . Colon cancer ([place])    rectal  . Diabetes mellitus without complication ([place])   . Hypertension   . Seizure [facility])    Past Surgical History:  Past Surgical History:  Procedure Laterality Date  . APPENDECTOMY    . JOINT REPLACEMENT Left 2004   Left hip  . TOTAL HIP ARTHROPLASTY     HPI:  Mark Buzby Whitakeris a 51 y.o.malewith medical history significant ofDM, HTN, and a chronic brain mass that's likely a meningioma. Patient presents to the ED with c/o L sided weakness. LKW when he went to bed last night. Woke up at 1AM with weakness. Went back to sleep. Still present when he woke up again in AM. MRI 12/13/16 showed Acute RIGHT paramedian brainstem infarct affecting lower pons and upper medulla.Heavily calcified mass, probably intra-axial, involves the medial RIGHT hemisphere, slightly increased from 2016.   Assessment / Plan / Recommendation Clinical Impression  Pt presents with a mild dysarthria of speech with imprecision of consonants, mild higher-level cognitive deficits noted during pathfinding, reasoning tasks.  Recommend acute SLP f/u to address these issues as well as the dysphagia identified on yesterday's assessment.  [name] agree with plan.     SLP Assessment  SLP Recommendation/Assessment: Patient needs continued Speech Lanaguage  Pathology Services SLP Visit Diagnosis: Dysarthria and anarthria (R47.1)    Follow Up Recommendations  Inpatient Rehab    Frequency and Duration min 2x/week  1 week      SLP Evaluation Cognition  Overall Cognitive Status: Impaired/Different from baseline Arousal/Alertness: Awake/alert Orientation Level: Oriented X4 Attention: Selective Selective Attention: Appears intact Memory: Appears intact Awareness: Appears intact Problem Solving: Impaired Problem Solving Impairment: Verbal complex Executive Function: Reasoning Safety/Judgment: Appears intact       Comprehension  Auditory Comprehension Overall Auditory Comprehension: Appears within functional limits for tasks assessed Reading Comprehension Reading Status: Within funtional limits    Expression Expression Primary Mode of Expression: Verbal Verbal Expression Overall Verbal Expression: Appears within functional limits for tasks assessed Written Expression Dominant Hand: Right Written Expression: Within Functional Limits   Oral / Motor  Oral Motor/Sensory Function Overall Oral Motor/Sensory Function: Mild impairment(mild palatal asymmetry) Motor Speech Overall Motor Speech: Impaired Articulation: Impaired Level of Impairment: Conversation Intelligibility: Intelligibility reduced Conversation: 75-100% accurate   GO                    [name] 12/14/2016, 1:49 PM  Salvatrice Morandi L. Tivis Ringer, [place] CCC/SLP Pager [phone]

## 2016-12-14 NOTE — Progress Notes (Signed)
STROKE TEAM PROGRESS NOTE   HISTORY OF PRESENT ILLNESS (per record) [name] is a 51 y.o. male who was last seen normal when he went to bed on 11/29, he awoke at 1 AM with left-sided weakness.  He went back to sleep, and when he awoke at 1 3 AM it was still present.  Over the course of the day, he feels that it is been stable to may be slightly improved, but still very much troubling to him.  He therefore has sought further care in the emergency department this evening.  Also, he has had severe hiccups over the course of the day.  LKW: 11/29 prior to bed tpa given?: no, out of window    SUBJECTIVE (INTERVAL HISTORY) No family members present. The patient states his diplopia is better but left hemiparesis is unchanged.     OBJECTIVE Temp:  [97.8 F (36.6 C)-99.1 F (37.3 C)] 97.8 F (36.6 C) (12/02 0900) Pulse Rate:  [90-101] 96 (12/02 0900) Cardiac Rhythm: Normal sinus rhythm (12/02 0700) Resp:  [20] 20 (12/02 0629) BP: (170-198)/(87-117) 170/87 (12/02 0900) SpO2:  [94 %-97 %] 96 % (12/02 0900)  CBC:  Recent Labs  Lab 12/12/16 2148  WBC 6.5  HGB 13.3  HCT 39.4  MCV 74.6*  PLT 627    Basic Metabolic Panel:  Recent Labs  Lab 12/12/16 2148  NA 140  K 3.4*  CL 106  CO2 22  GLUCOSE 132*  BUN 12  CREATININE 1.21  CALCIUM 9.8    Lipid Panel:     Component Value Date/Time   CHOL 227 (H) 12/13/2016 0421   TRIG 77 12/13/2016 0421   HDL 58 12/13/2016 0421   CHOLHDL 3.9 12/13/2016 0421   VLDL 15 12/13/2016 0421   LDLCALC 154 (H) 12/13/2016 0421   HgbA1c:  Lab Results  Component Value Date   HGBA1C 6.2 (H) 12/13/2016   Urine Drug Screen:     Component Value Date/Time   LABOPIA NONE DETECTED 12/13/2016 0133   COCAINSCRNUR NONE DETECTED 12/13/2016 0133   LABBENZ NONE DETECTED 12/13/2016 0133   AMPHETMU NONE DETECTED 12/13/2016 0133   THCU NONE DETECTED 12/13/2016 0133   LABBARB NONE DETECTED 12/13/2016 0133    Alcohol Level     Component  Value Date/Time   ETH <5 07/21/2014 2130    IMAGING   Ct Angio Head W Or Wo Contrast Ct Angio Neck W Or Wo Contrast 12/13/2016 IMPRESSION:  1. Negative CTA for large or proximal arterial branch occlusion.  2. Moderate intracranial atherosclerotic disease, most evident involving the anterior cerebral arteries bilaterally were there are moderate to severe multifocal stenoses. Moderate carotid siphon atherosclerosis with resultant mild to moderate multifocal narrowing, with mild to moderate bilateral V4 atheromatous changes, left worse than right.  3. Mild carotid bifurcation atherosclerosis without flow-limiting stenosis.    Ct Head Wo Contrast 12/12/2016 IMPRESSION:  1. No acute intracranial abnormality identified.  2. Stable right medial frontal region calcified mass and vasogenic edema in right frontal lobe.  3. Stable bilateral anterior frontal lobe encephalomalacia.  4. Mild ethmoid sinus disease.     Mr Brain Wo Contrast 12/13/2016 IMPRESSION:  Acute RIGHT paramedian brainstem infarct affecting lower pons and upper medulla, is responsible for the reported symptoms. This appears to represent a small vessel insult, as the adjacent major vascular structures appear widely patent. Heavily calcified mass, probably intra-axial, involves the medial RIGHT hemisphere, slightly increased from 2016. Further assessment could be performed with post infusion imaging as  clinically indicated. Atrophy and small vessel disease.    Dg Chest Port 1 View 12/12/2016 IMPRESSION:  Stable cardiomegaly.  No acute pulmonary process identified.     Transthoracic Echocardiogram - Left ventricle: The cavity size was normal. Wall thickness was   increased in a pattern of mild LVH. There was severe focal basal   hypertrophy of the septum. Systolic function was normal. The   estimated ejection fraction was in the range of 60% to 65%. Wall   motion was normal; there were no regional wall motion    abnormalities12/01/2016      PHYSICAL EXAM Vitals:   12/13/16 2226 12/14/16 0205 12/14/16 0629 12/14/16 0900  BP: (!) 180/104 (!) 177/112 (!) 176/116 (!) 170/87  Pulse: 96 99 91 96  Resp: 20 20 20    Temp: 98.9 F (37.2 C) 99.1 F (37.3 C) 98.1 F (36.7 C) 97.8 F (36.6 C)  TempSrc: Oral Oral Oral Oral  SpO2: 97% 94% 96% 96%  Weight:      Height:       Pleasant middle-aged African-American male currently not in distress. . Afebrile. Head is nontraumatic. Neck is supple without bruit.    Cardiac exam no murmur or gallop. Lungs are clear to auscultation. Distal pulses are well felt. Neurological Exam ; Awake alert oriented x 3 normal speech and language. Extraocular moments show mild right sixth nerve palsy. Saccadic dysmetria on lateral gaze bilaterally. Mild left lower face asymmetry. Tongue midline. Mild left upper and lower extremity drift. Left hemiparesis 4/5 Mild diminished fine finger movements on left. Orbits right over left upper extremity. Mild left grip weak.. Normal sensation . Normal coordination. Gait deferred    ASSESSMENT/PLAN [name] is a 51 y.o. male with history of hypertension, seizure disorder, diabetes mellitus, and history of colon cancer presenting with left-sided weakness and diplopia. He did not receive IV t-PA due to late presentation.  Stroke:  Brainstem infarct secondary to small vessel disease.  Resultant diplopia and left hemiparesis  CT head - Stable right medial frontal region calcified mass and vasogenic edema in right frontal lobe.  MRI head - Acute RIGHT paramedian brainstem infarct affecting lower pons and upper medulla  MRA head - not performed  CTA H&N - Moderate intracranial atherosclerotic disease (see details above)  Carotid Doppler - CTA neck 2D Echo -Left ventricle: The cavity size was normal. Wall thickness was   increased in a pattern of mild LVH. There was severe focal basal   hypertrophy of the septum. Systolic  function was normal. The   estimated ejection fraction was in the range of 60% to 65%. Wall   motion was normal; there were no regional wall motion    abnormalities  LDL - 154  HgbA1c - 6.2  VTE prophylaxis - Lovenox DIET DYS 3 Room service appropriate? Yes; Fluid consistency: Nectar Thick  No antithrombotic prior to admission, now on aspirin 325 mg daily    Patient counseled to be compliant with his antithrombotic medications  Ongoing aggressive stroke risk factor management  Therapy recommendations: CIR recommended  Disposition:  Pending  Hypertension  Stable - but high  Permissive hypertension (OK if < 220/120) but gradually normalize in 5-7 days  Long-term BP goal normotensive  Hyperlipidemia  Home meds:  No lipid lowering medications prior to admission  LDL 154, goal < 70  Now on Lipitor 80 mg daily  Continue statin at discharge  Diabetes  HgbA1c 6.2, goal < 7.0  Controlled  Other Stroke Risk  Factors  ETOH use, advised to drink no more than 1 drink per day.  Obesity, Body mass index is 38.97 kg/m., recommend weight loss, diet and exercise as appropriate   Family hx stroke (father)  History of colon cancer  Other Active Problems  Hypokalemia - 3.4 -> supplement   Plan / Recommendations  Aspirin 325 mg daily   Moderate intracranial atherosclerotic disease (see details above)  Possible CIR admission  Lipitor 80 mg daily  Follow-up with Dr. Leonie Man after discharge    Hospital day # [place] PA-C [facility] Pager [phone] 12/14/2016, 11:02 AM I have personally examined this patient, reviewed notes, independently viewed imaging studies, participated in medical decision making and plan of care.ROS completed by me personally and pertinent positives fully documented  I have made any additions or clarifications directly to the above note. Agree with note above. He presented with diplopia, gait ataxia and left sided  weakness secondary to brainstem and cerebellar infarcts etiology likely small vessel disease though he does have intracranial atherosclerosis as well. Recommend Aspirin  and aggressive risk factor modification. He would likely need rehabilitation. Discussion with patient and Dr. Wynelle Cleveland and answered questions. Greater than 50% time during this 15 minute visit was spent on counseling and coordination of care about his stroke and answering questions. Stroke team will sign off. Kindly call for questions  Antony Contras, MD Medical Director [place] Pager: [phone] 12/14/2016 11:02 AM    To contact Stroke Continuity provider, please refer to [email or web address] After hours, contact General Neurology

## 2016-12-15 ENCOUNTER — Other Ambulatory Visit: Payer: Self-pay

## 2016-12-15 DIAGNOSIS — N179 Acute kidney failure, unspecified: Secondary | ICD-10-CM

## 2016-12-15 DIAGNOSIS — E119 Type 2 diabetes mellitus without complications: Secondary | ICD-10-CM

## 2016-12-15 DIAGNOSIS — G939 Disorder of brain, unspecified: Secondary | ICD-10-CM

## 2016-12-15 DIAGNOSIS — I5189 Other ill-defined heart diseases: Secondary | ICD-10-CM

## 2016-12-15 DIAGNOSIS — I69391 Dysphagia following cerebral infarction: Secondary | ICD-10-CM

## 2016-12-15 DIAGNOSIS — I10 Essential (primary) hypertension: Secondary | ICD-10-CM

## 2016-12-15 DIAGNOSIS — I639 Cerebral infarction, unspecified: Secondary | ICD-10-CM

## 2016-12-15 DIAGNOSIS — I519 Heart disease, unspecified: Secondary | ICD-10-CM

## 2016-12-15 DIAGNOSIS — R7303 Prediabetes: Secondary | ICD-10-CM

## 2016-12-15 LAB — GLUCOSE, CAPILLARY
GLUCOSE-CAPILLARY: 125 mg/dL — AB (ref 65–99)
GLUCOSE-CAPILLARY: 123 mg/dL — AB (ref 65–99)
GLUCOSE-CAPILLARY: 120 mg/dL — AB (ref 65–99)
Glucose-Capillary: 167 mg/dL — ABNORMAL HIGH (ref 65–99)
Glucose-Capillary: 163 mg/dL — ABNORMAL HIGH (ref 65–99)

## 2016-12-15 NOTE — NC FL2 (Signed)
[place] MEDICAID FL2 LEVEL OF CARE SCREENING TOOL     IDENTIFICATIONf  Patient Name: [name] Birthdate: 07/15/1965 Sex: male Admission Date (Current Location): 12/12/2016  [facility] [place] Number:  Herbalist and Address:  [facility], [place] [address] [place], [place] [address]      Provider Number: [id number]  Attending Physician Name and Address:  Debbe Odea, MD  Relative Name and Phone Number:       Current Level of Care: Hospital Recommended Level of Care: [place] Prior Approval Number:    Date Approved/Denied:   PASRR Number: [id number]  Discharge Plan: SNF    Current Diagnoses: Patient Active Problem List   Diagnosis Date Noted  . Acute ischemic stroke ([place]) 12/12/2016  . Acute respiratory failure with hypercapnia ([place])   . Seizures ([place]) 07/22/2014  . Hypertension 07/22/2014  . Hyperglycemia 07/22/2014  . DM2 (diabetes mellitus, type 2) ([place]) 07/22/2014  . Pyrexia   . Brain mass   . Malignant neoplasm of rectum ([place]) 09/02/2007    Orientation RESPIRATION BLADDER Height & Weight     Self, Time, Situation, Place  Normal Incontinent, External catheter(catheter placed 12/14/16) Weight: 220 lb (99.8 kg) Height:  5\' 3"  (160 cm)  BEHAVIORAL SYMPTOMS/MOOD NEUROLOGICAL BOWEL NUTRITION STATUS      Continent Diet(mechanical soft, nectar thick liquids)  AMBULATORY STATUS COMMUNICATION OF NEEDS Skin   Extensive Assist Verbally Normal                       Personal Care Assistance Level of Assistance  Bathing, Feeding, Dressing Bathing Assistance: Maximum assistance Feeding assistance: Limited assistance Dressing Assistance: Maximum assistance     Functional Limitations Info  Sight, Hearing, Speech Sight Info: Impaired(current light sensitivity, some blurry vision) Hearing Info: Adequate Speech Info: Adequate    SPECIAL CARE FACTORS FREQUENCY  PT (By licensed PT), OT (By licensed  OT)     PT Frequency: 5x/wk OT Frequency: 5x/wk            Contractures Contractures Info: Not present    Additional Factors Info  Code Status, Allergies, Insulin Sliding Scale Code Status Info: Full Allergies Info: NKA   Insulin Sliding Scale Info: 0-9 units 3x/day with meals       Current Medications (12/15/2016):  This is the current hospital active medication list Current Facility-Administered Medications  Medication Dose Route Frequency Provider Last Rate Last Dose  . acetaminophen (TYLENOL) tablet 650 mg  650 mg Oral Q4H PRN Etta Quill, DO       Or  . acetaminophen (TYLENOL) solution 650 mg  650 mg Per Tube Q4H PRN Etta Quill, DO       Or  . acetaminophen (TYLENOL) suppository 650 mg  650 mg Rectal Q4H PRN Etta Quill, DO      . amLODipine (NORVASC) tablet 10 mg  10 mg Oral Daily Jennette Kettle M, DO   10 mg at 12/15/16 0820  . aspirin suppository 300 mg  300 mg Rectal Daily Jennette Kettle M, DO       Or  . aspirin tablet 325 mg  325 mg Oral Daily Jennette Kettle M, DO   325 mg at 12/15/16 0820  . atorvastatin (LIPITOR) tablet 80 mg  80 mg Oral q1800 Rosalin Hawking, MD   80 mg at 12/14/16 1807  . carvedilol (COREG) tablet 25 mg  25 mg Oral BID WC Debbe Odea, MD  25 mg at 12/15/16 0821  . enoxaparin (LOVENOX) injection 40 mg  40 mg Subcutaneous Q24H Jennette Kettle M, DO   40 mg at 12/15/16 8325  . insulin aspart (novoLOG) injection 0-9 Units  0-9 Units Subcutaneous TID WC Debbe Odea, MD   1 Units at 12/15/16 0821  . irbesartan (AVAPRO) tablet 150 mg  150 mg Oral Daily Jennette Kettle M, DO   150 mg at 12/15/16 4982  . labetalol (NORMODYNE,TRANDATE) injection 5-10 mg  5-10 mg Intravenous Q2H PRN Etta Quill, DO   10 mg at 12/15/16 6415  . [place]   Oral PRN Debbe Odea, MD      . senna-docusate (Senokot-S) tablet 1 tablet  1 tablet Oral QHS PRN Etta Quill, DO         Discharge Medications: Please see discharge summary  for a list of discharge medications.  Relevant Imaging Results:  Relevant Lab Results:   Additional Information SS#: [id number]  Geralynn Ochs, LCSW

## 2016-12-15 NOTE — Progress Notes (Signed)
Physical Therapy Treatment Patient Details Name: [name] MRN: [id number] DOB: 1965/11/22 Today's Date: 12/15/2016    History of Present Illness 51 y.o.malewith medical history significant ofDM, HTN, and a chronic brain mass that's likely a meningioma. Patient presents to the ED with c/o L sided weakness. CT head just shows chronic brain mass with vasogenic edema, essentially unchanged from a year ago. 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    PT Comments    Patient progressing well towards PT goals. Tolerated standing bouts today with assist of 2 using back of chair for support. Able to stand for ~5 mins and perform weight shifting with minimal support through left knee to prevent buckling. Pt continues to have dizziness due to nystagmus and diplopia. Also with elevated BP. OT to address vertical diplopia. Able to initiate thoracic and lumbar extensors but only able to sustain for seconds and then fatigues. Pt great CIR candidate.  Will continue to follow and progress as tolerated.   Follow Up Recommendations  CIR     Equipment Recommendations  Other (comment)(TBD)    Recommendations for Other Services Rehab consult     Precautions / Restrictions Precautions Precautions: Fall Precaution Comments: watch BP Restrictions Weight Bearing Restrictions: No    Mobility  Bed Mobility Overal bed mobility: Needs Assistance Bed Mobility: Rolling;Sidelying to Sit Rolling: Supervision Sidelying to sit: Mod assist       General bed mobility comments: Able to bring LEs off bed, assist with trunk and to weight shift towards left to balance self. Used left bed rail to roll.  Transfers Overall transfer level: Needs assistance Equipment used: 2 person hand held assist Transfers: Stand Pivot Transfers Sit to Stand: Min assist;+2 physical assistance Stand pivot transfers: Max assist;+2 physical assistance       General  transfer comment: Assist of 2 to power to standing using back of chair to pull up; Pt hyperextends left knee to stabilize. Stood from EOB x2. + dizziness with all movements secondary to nystagmus and diplopia. Able to take a few steps to get to chair with cues for sequencing and advancing each LE, assist for weight shift and to prevent left knee from buckling with WB.  Ambulation/Gait             General Gait Details: Deferred.    Stairs            Wheelchair Mobility    Modified Rankin (Stroke Patients Only) Modified Rankin (Stroke Patients Only) Pre-Morbid Rankin Score: No symptoms Modified Rankin: Moderately severe disability     Balance Overall balance assessment: Needs assistance Sitting-balance support: Feet supported;No upper extremity supported Sitting balance-Leahy Scale: Fair Sitting balance - Comments: Able to initiate thoracic and lumbar extension for upright posture but only able to sustain for seconds and then fatigues. Requires external support during dynamic tasks.  Postural control: Left lateral lean Standing balance support: During functional activity;Bilateral upper extremity supported Standing balance-Leahy Scale: Poor Standing balance comment: Able to stand for ~5 mins with BUEs resting on back of chair and left knee hyperextended. Needs cues for upright posture as pt fatigues and leans on chair when fatigued. Weightshifts right/left and requires support through left knee to prevent buckling but able to activate quads.                            Cognition Arousal/Alertness: Awake/alert Behavior During Therapy: WFL for tasks assessed/performed Overall Cognitive  Status: Within Functional Limits for tasks assessed                                 General Comments: Cognition intact for basic mobility; will need further assessment. Laughs appropriately.       Exercises      General Comments General comments (skin integrity,  edema, etc.): Supine BP 173/105, sitting BP 193/113, sitting post standing 196/124- RN notified.       Pertinent Vitals/Pain Pain Assessment: No/denies pain    Home Living                      Prior Function            PT Goals (current goals can now be found in the care plan section) Progress towards PT goals: Progressing toward goals    Frequency    Min 4X/week      PT Plan Current plan remains appropriate    Co-evaluation PT/OT/SLP Co-Evaluation/Treatment: Yes Reason for Co-Treatment: Complexity of the patient's impairments (multi-system involvement);For patient/therapist safety;To address functional/ADL transfers PT goals addressed during session: Mobility/safety with mobility;Balance;Strengthening/ROM        AM-PAC PT "6 Clicks" Daily Activity  Outcome Measure  Difficulty turning over in bed (including adjusting bedclothes, sheets and blankets)?: A Little Difficulty moving from lying on back to sitting on the side of the bed? : Unable Difficulty sitting down on and standing up from a chair with arms (e.g., wheelchair, bedside commode, etc,.)?: Unable Help needed moving to and from a bed to chair (including a wheelchair)?: A Lot Help needed walking in hospital room?: A Lot Help needed climbing 3-5 steps with a railing? : Total 6 Click Score: 10    End of Session Equipment Utilized During Treatment: Gait belt Activity Tolerance: Patient tolerated treatment well;Treatment limited secondary to medical complications (Comment)(elevated BP) Patient left: in chair;with call bell/phone within reach;with family/visitor present Nurse Communication: Mobility status;Need for lift equipment(recommend using stedy) PT Visit Diagnosis: Other symptoms and signs involving the nervous system (W96.045)     Time: 4098-1191 PT Time Calculation (min) (ACUTE ONLY): 30 min  Charges:  $Neuromuscular Re-education: 8-22 mins                    G Codes:       Wray Kearns, PT, DPT [phone]     Marguarite Arbour A Andreas Sobolewski 12/15/2016, 11:31 AM

## 2016-12-15 NOTE — Consult Note (Signed)
Physical Medicine and Rehabilitation Consult Reason for Consult: Left side weakness Referring Physician: [facility]  HPI: [name] is a 51 y.o. right handed male with history of hypertension, diabetes mellitus and a chronic brain mass likely meningioma. Per chart review and patient, patient lives with spouse. Independent prior to admission. Works at [employer] part time. One level home to steps to entry. Wife works during the day. Presented 12/12/2016 with left-sided weakness. Noted blood pressure 240/140. Patient had reported he recently ran out of his clonidine and ARB a few months ago. Cranial CT scan reviewed, unremarkable for acute process. Per report, stable right medial frontal region calcified mass in vasogenic edema right frontal lobe. Stable bilateral anterior frontal lobe encephalomalacia. Patient did not receive TPA. CT angiogram head and neck negative for larger proximal arterial branch occlusion. MRI showed acute right paramedian brainstem infarct affecting lower pons and upper medulla. Heavy calcified mass probably intra-axial involves the medial right hemisphere slightly increased from 2016. Echocardiogram with ejection fraction of 81% grade 1 diastolic dysfunction no source of emboli. Maintained on aspirin for CVA prophylaxis. Subcutaneous Lovenox for DVT prophylaxis. Mechanical soft nectar thick liquid diet. Physical and occupational therapy evaluations completed with recommendations of physical medicine rehabilitation consult.  Review of Systems  Constitutional: Negative for chills and fever.  HENT: Negative for hearing loss.   Eyes: Negative for blurred vision and double vision.  Respiratory: Negative for cough and shortness of breath.   Cardiovascular: Negative for chest pain, palpitations and leg swelling.  Gastrointestinal: Positive for constipation. Negative for nausea and vomiting.  Genitourinary: Negative for dysuria, flank pain and hematuria.  Skin: Negative for  rash.  Neurological: Positive for speech change and focal weakness.  All other systems reviewed and are negative.  Past Medical History:  Diagnosis Date  . Colon cancer ([place])    rectal  . Diabetes mellitus without complication ([place])   . Hypertension   . Seizure [facility])    Past Surgical History:  Procedure Laterality Date  . APPENDECTOMY    . JOINT REPLACEMENT Left 2004   Left hip  . TOTAL HIP ARTHROPLASTY     Family History  Problem Relation Age of Onset  . Hypertension Mother   . Heart disease Mother   . Hyperlipidemia Mother   . Heart failure Mother   . Hypertension Sister   . Hypertension Brother   . Stroke Father   . Hypertension Father   . Colon cancer Maternal Grandmother   . Diabetes Maternal Grandmother    Social History:  reports that  has never smoked. he has never used smokeless tobacco. He reports that he drinks alcohol. He reports that he does not use drugs. Allergies: No Known Allergies Medications Prior to Admission  Medication Sig Dispense Refill  . amLODipine (NORVASC) 10 MG tablet Take 1 tablet (10 mg total) by mouth daily. 30 tablet 1  . ibuprofen (ADVIL,MOTRIN) 200 MG tablet Take 200-400 mg by mouth every 6 (six) hours as needed for fever, headache or mild pain.    Marland Kitchen JANUMET XR [phone] MG TB24 Take 1 tablet by mouth daily.    . valsartan (DIOVAN) 160 MG tablet Take 160 mg by mouth daily.      Home: Home Living Family/patient expects to be discharged to:: Other (Comment) Living Arrangements: Spouse/significant other Available Help at Discharge: Family Type of Home: House Home Access: Stairs to enter [employer] of Steps: 2 Entrance Stairs-Rails: None Home Layout: One level Bathroom Shower/Tub: Tub/shower unit  Bathroom Toilet: Standard Home Equipment: Environmental consultant - 2 wheels, [employer] - single point, Bedside commode, Shower seat, Toilet riser, Grab bars - tub/shower Additional Comments: Pt reports spouse and daughter work part time   Lives  With: Spouse, Daughter  Functional History: Prior Function Level of Independence: Independent Comments: 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, mining Status:  Mobility: Bed Mobility Overal bed mobility: Needs Assistance Bed Mobility: Rolling, Sidelying to Sit, Sit to Sidelying Rolling: Supervision(To Left using bedrail ) Sidelying to sit: Mod assist Sit to sidelying: Min assist General bed mobility comments: assist to move LEs off bed, to lift trunk and to shift weight to Lt and to balance self  Transfers General transfer comment: unable to attempt due to elevated BP       ADL: ADL Overall ADL's : Needs assistance/impaired Eating/Feeding: NPO Grooming: Wash/dry hands, Wash/dry face, Sitting Upper Body Bathing: Maximal assistance, Sitting, Bed level Lower Body Bathing: Total assistance, Bed level Upper Body Dressing : Maximal assistance, Sitting Lower Body Dressing: Total assistance, Bed level Toilet Transfer: Total assistance Toilet Transfer Details (indicate cue type and reason): unable to accurately assess  Toileting- Clothing Manipulation and Hygiene: Total assistance, Bed level General ADL Comments: Pt limited to brief EOB activity due to elevated BP   Cognition: Cognition Overall Cognitive Status: Impaired/Different from baseline Arousal/Alertness: Awake/alert Orientation Level: Oriented X4 Attention: Selective Selective Attention: Appears intact Memory: Appears intact Awareness: Appears intact Problem Solving: Impaired Problem Solving Impairment: Verbal complex Executive Function: Reasoning Safety/Judgment: Appears intact Cognition Arousal/Alertness: Awake/alert Behavior During Therapy: Flat affect Overall Cognitive Status: Impaired/Different from baseline General Comments: Cognition intact for basic info.  Will benefit from further assessment   Blood pressure (!) 144/91, pulse 87, temperature 98.8 F (37.1  C), temperature source Oral, resp. rate 18, height 5\' 3"  (1.6 m), weight 99.8 kg (220 lb), SpO2 97 %. Physical Exam  Vitals reviewed. Constitutional: He is oriented to person, place, and time. He appears well-developed and well-nourished.  HENT:  Head: Normocephalic and atraumatic.  Eyes: EOM are normal. Right eye exhibits no discharge. Left eye exhibits no discharge.  Neck: Normal range of motion. Neck supple. No thyromegaly present.  Cardiovascular: Normal rate, regular rhythm and normal heart sounds.  Respiratory: Effort normal and breath sounds normal. No respiratory distress.  GI: Soft. Bowel sounds are normal. He exhibits no distension.  Musculoskeletal:  No edema or tenderness in extremities  Neurological: He is alert and oriented to person, place, and time.  Follow simple commands Motor: RUE/RLE: 5/5 proximal to distal LUE: 0/5 proximal to distal LLE: 1/5 proximal to distal Sensation diminished to light touch LUE/LLE  Skin: Skin is warm and dry.  Psychiatric: He has a normal mood and affect. His behavior is normal.    Results for orders placed or performed during the hospital encounter of 12/12/16 (from the past 24 hour(s))  Glucose, capillary     Status: Abnormal   Collection Time: 12/14/16  6:59 AM  Result Value Ref Range   Glucose-Capillary 128 (H) 65 - 99 mg/dL  Glucose, capillary     Status: Abnormal   Collection Time: 12/14/16 11:31 AM  Result Value Ref Range   Glucose-Capillary 128 (H) 65 - 99 mg/dL   Comment 1 Notify RN    Comment 2 Document in Chart   Glucose, capillary     Status: Abnormal   Collection Time: 12/14/16  4:27 PM  Result Value Ref Range  Glucose-Capillary 120 (H) 65 - 99 mg/dL   Comment 1 Notify RN    Comment 2 Document in Chart   Glucose, capillary     Status: Abnormal   Collection Time: 12/14/16  8:51 PM  Result Value Ref Range   Glucose-Capillary 134 (H) 65 - 99 mg/dL   Comment 1 Notify RN    Comment 2 Document in Chart    Mr Brain Wo  Contrast  Result Date: 12/13/2016 CLINICAL DATA:  LEFT-sided weakness. This began early a.m. 12/12/2016. History of hypertension and diabetes. EXAM: MRI HEAD WITHOUT CONTRAST TECHNIQUE: Multiplanar, multiecho pulse sequences of the brain and surrounding structures were obtained without intravenous contrast. COMPARISON:  CT head and CTA head neck 12/12/2016. MRI brain without with contrast 09/04/2014. FINDINGS: Brain: There is a moderately large brainstem infarct, affecting the lower pons and upper medulla, RIGHT paramedian location, extending from the basis pontis to the floor of the fourth ventricle. No other areas of acute infarction are seen. No hemorrhage, hydrocephalus, or extra-axial fluid. Heavily calcified mass involving the medial RIGHT hemisphere, could be slightly increased from priors. Cross-section of 15 x 37 mm compares with 14 x 34 mm in 2016. A small component of the mass does extend under the falx to the LEFT, as noted previously. Surrounding vasogenic edema is present primarily in the RIGHT hemisphere. No contrast was administered. Concern for calcific glial neoplasm is raised, as the mass does appear to be intra-axial. Marked corpus callosum all atrophy, uncertain etiology. Bifrontal white matter gliosis, in this patient with a history of trauma. Other areas of subcortical and periventricular white matter signal abnormality, probable small vessel disease. Vascular: Normal flow voids. Skull and upper cervical spine: Normal marrow signal. Sinuses/Orbits: Negative. Other: None. IMPRESSION: Acute RIGHT paramedian brainstem infarct affecting lower pons and upper medulla, is responsible for the reported symptoms. This appears to represent a small vessel insult, as the adjacent major vascular structures appear widely patent. Heavily calcified mass, probably intra-axial, involves the medial RIGHT hemisphere, slightly increased from 2016. Further assessment could be performed with post infusion imaging  as clinically indicated. Atrophy and small vessel disease. Electronically Signed   By: Staci Righter M.D.   On: 12/13/2016 07:30    Assessment/Plan: Diagnosis: Right paramedian brainstem infarct  Labs and images independently reviewed.  Records reviewed and summated above. Stroke: Continue secondary stroke prophylaxis and Risk Factor Modification listed below:   Antiplatelet therapy:   Blood Pressure Management:  Continue current medication with prn's with permisive HTN per primary team Statin Agent:   Prediabetes management:   Left sided hemiparesis: fit for orthosis to prevent contractures (resting hand splint for day, wrist cock up splint at night, PRAFO, etc) Motor recovery: Fluoxetine  1. Does the need for close, 24 hr/day medical supervision in concert with the patient's rehab needs make it unreasonable for this patient to be served in a less intensive setting? Yes  2. Co-Morbidities requiring supervision/potential complications: HTN (monitor and provide prns in accordance with increased physical exertion and pain), diabetes mellitus (Monitor in accordance with exercise and adjust meds as necessary), chronic brain mass (recs per neurology), diastolic dysfunction (monitor for signs/symptoms of fluid overload), post-stroke dysphagia (advance diet as tolerated), hypokalemia (continue to monitor and replete as necessary), AKI (avoid nephrotoxic meds), prediabetes (Monitor in accordance with exercise and adjust meds as necessary) 3. Due to safety, disease management, pain management and patient education, does the patient require 24 hr/day rehab nursing? Yes 4. Does the patient require coordinated care of  a physician, rehab nurse, PT (1-2 hrs/day, 5 days/week), OT (1-2 hrs/day, 5 days/week) and SLP (1-2 hrs/day, 5 days/week) to address physical and functional deficits in the context of the above medical diagnosis(es)? Yes Addressing deficits in the following areas: balance, endurance, locomotion,  strength, transferring, bathing, dressing, toileting, speech, swallowing and psychosocial support 5. Can the patient actively participate in an intensive therapy program of at least 3 hrs of therapy per day at least 5 days per week? Yes 6. The potential for patient to make measurable gains while on inpatient rehab is excellent 7. Anticipated functional outcomes upon discharge from inpatient rehab are min assist  with PT, min assist with OT, modified independent with SLP. 8. Estimated rehab length of stay to reach the above functional goals is: 18-22 days. 9. Anticipated D/C setting: Other 10. Anticipated post D/C treatments: SNF 11. Overall Rehab/Functional Prognosis: good  RECOMMENDATIONS: This patient's condition is appropriate for continued rehabilitative care in the following setting: CIR to decrease burden of care prior once final recs from Neurology and BP stable, hopefully tomorrow. Patient has agreed to participate in recommended program. Yes Note that insurance prior authorization may be required for reimbursement for recommended care.  Comment: Rehab Admissions Coordinator to follow up.  Delice Lesch, MD, ABPMR Lavon Paganini Angiulli, PA-C 12/15/2016

## 2016-12-15 NOTE — Progress Notes (Signed)
I met with pt at bedside to discuss goals and expectations of an inpt rehab admission,, he lives with his wife who works fulltime and 50 year old daughter. I discussed the need for caregiver support when wife works. He is uninsured. I requested he discuss with his wife and have her to call me. Rehab venue pending for CIR vs SNF. [phone]

## 2016-12-15 NOTE — Progress Notes (Signed)
  Speech Language Pathology Treatment: Dysphagia  Patient Details Name: [name] MRN: [id number] DOB: 06/06/1965 Today's Date: 12/15/2016 Time: 4585-9292 SLP Time Calculation (min) (ACUTE ONLY): 12 min  Assessment / Plan / Recommendation Clinical Impression  F/u diet tolerance assessment complete with focus on readiness to advance diet. Patient able to self feed with independent use of safe swallowing precautions. Noted wet vocal quality at baseline as well as intermittent throat clearing post intake of both thin and nectar thick liquids, all suggestive of decreased airway protection. In light of inconsistencies and location of infarct, recommend instrumental testing to ensure least restrictive diet. MBS planned for 12/4.    HPI HPI: Mark Youtsey Whitakeris a 51 y.o.malewith medical history significant ofDM, HTN, and a chronic brain mass that's likely a meningioma. Patient presents to the ED with c/o L sided weakness. LKW when he went to bed last night. Woke up at 1AM with weakness. Went back to sleep. Still present when he woke up again in AM. MRI 12/13/16 showed Acute RIGHT paramedian brainstem infarct affecting lower pons and upper medulla.Heavily calcified mass, probably intra-axial, involves the medial RIGHT hemisphere, slightly increased from 2016.      SLP Plan  MBS       Recommendations  Diet recommendations: Dysphagia 3 (mechanical soft);Nectar-thick liquid Liquids provided via: Cup Medication Administration: Whole meds with puree Supervision: Patient able to self feed;Intermittent supervision to cue for compensatory strategies Compensations: Slow rate;Small sips/bites;Minimize environmental distractions Postural Changes and/or Swallow Maneuvers: Seated upright 90 degrees                Oral Care Recommendations: Oral care BID Follow up Recommendations: Inpatient Rehab SLP Visit Diagnosis: Dysphagia, oropharyngeal phase (R13.12) Plan: MBS       GO           Gabriel Rainwater [place], CCC-SLP [phone]       Case Vassell Meryl 12/15/2016, 3:37 PM

## 2016-12-15 NOTE — Progress Notes (Signed)
PROGRESS NOTE    [name]   KDX:[id number]  DOB: [birth year]  DOA: 12/12/2016 PCP: Glendale Chard, MD   Brief Narrative:  [name] 51 y.o. male with medical history significant of DM, HTN, and a chronic brain mass that's likely a meningioma.  Patient presents to the ED with c/o L sided weakness. BP 240/140  Subjective: No change in weakness. No other new complaints.  ROS: no complaints of nausea, vomiting, constipation diarrhea, cough, dyspnea or dysuria. No other complaints.   Assessment & Plan:   Principal Problem:   Acute ischemic stroke   - symptoms of left sided weakness and diplopia - ? Due to HTN- admits that he ran out of his Clonidine and ARB a few months ago - CT head: Stable right medial frontal region calcified mass and vasogenic edema in right frontal lobe. Stable bilateral anterior frontal lobe encephalomalacia. CTA: Moderate intracranial atherosclerotic disease MRI brain: Acute RIGHT paramedian brainstem infarct affecting lower pons and upper medulla... Heavily calcified mass, probably intra-axial, involves the medial RIGHT hemisphere, slightly increased from 2016. - Lipids: Cholesterol 227, LDL 154, HDL 58- Lipitor started - A1c: 6.2 - based on extent of weakness, he will need need CIR or SNF    Active Problems: Calcified mass in brain - chronic, appears to be increasing in size based on report- neuro to give further recommendations   Hypertension - severe - on Amlodipine at home- ran out of Valsartan and Clonidine  -12/1 resumed Irbesartan - 12/2- start Coreg today - cont to follow BP  HLD - see above- Lipitor started  Proteinuria - ? Due to HTN     DM2 (diabetes mellitus, type 2)  - A1c controlled at 6.2- takes Janumet- cont SSI   DVT prophylaxis: Lovenox Code Status: Full code Family Communication:  Disposition Plan: follow on tele Consultants:   neuro Procedures:    Antimicrobials:  Anti-infectives (From admission,  onward)   None       Objective: Vitals:   12/15/16 0145 12/15/16 0637 12/15/16 0800 12/15/16 1343  BP: (!) 144/91 (!) 175/115 (!) 180/98 126/75  Pulse: 87 93  89  Resp: 18 18  20   Temp: 98.8 F (37.1 C) 98.9 F (37.2 C)  98.8 F (37.1 C)  TempSrc: Oral Oral  Oral  SpO2: 97% 98%  94%  Weight:      Height:        Intake/Output Summary (Last 24 hours) at 12/15/2016 1519 Last data filed at 12/15/2016 1342 Gross per 24 hour  Intake 960 ml  Output 1050 ml  Net -90 ml   Filed Weights   12/12/16 2128  Weight: 99.8 kg (220 lb)    Examination: General exam: Appears comfortable  HEENT: PERRLA, oral mucosa moist, no sclera icterus or thrush Respiratory system: Clear to auscultation. Respiratory effort normal. Cardiovascular system: S1 & S2 heard, RRR.  No murmurs  Gastrointestinal system: Abdomen soft, non-tender, nondistended. Normal bowel sound. No organomegaly Central nervous system: Alert and oriented. Blurred vision and diplopia- left arm and leg 1/5 strength Extremities: No cyanosis, clubbing or edema Skin: No rashes or ulcers Psychiatry:  Mood & affect appropriate.     Data Reviewed: I have personally reviewed following labs and imaging studies  CBC: Recent Labs  Lab 12/12/16 2148  WBC 6.5  HGB 13.3  HCT 39.4  MCV 74.6*  PLT 976   Basic Metabolic Panel: Recent Labs  Lab 12/12/16 2148  NA 140  K 3.4*  CL 106  CO2 22  GLUCOSE 132*  BUN 12  CREATININE 1.21  CALCIUM 9.8   GFR: Estimated Creatinine Clearance: 76.5 mL/min (by C-G formula based on SCr of 1.21 mg/dL). Liver Function Tests: No results for input(s): AST, ALT, ALKPHOS, BILITOT, PROT, ALBUMIN in the last 168 hours. No results for input(s): LIPASE, AMYLASE in the last 168 hours. No results for input(s): AMMONIA in the last 168 hours. Coagulation Profile: No results for input(s): INR, PROTIME in the last 168 hours. Cardiac Enzymes: No results for input(s): CKTOTAL, CKMB, CKMBINDEX,  TROPONINI in the last 168 hours. BNP (last 3 results) No results for input(s): PROBNP in the last 8760 hours. HbA1C: Recent Labs    12/13/16 0421  HGBA1C 6.2*   CBG: Recent Labs  Lab 12/14/16 1131 12/14/16 1627 12/14/16 2051 12/15/16 0640 12/15/16 1230  GLUCAP 128* 120* 134* 123* 167*   Lipid Profile: Recent Labs    12/13/16 0421  CHOL 227*  HDL 58  LDLCALC 154*  TRIG 77  CHOLHDL 3.9   Thyroid Function Tests: No results for input(s): TSH, T4TOTAL, FREET4, T3FREE, THYROIDAB in the last 72 hours. Anemia Panel: No results for input(s): VITAMINB12, FOLATE, FERRITIN, TIBC, IRON, RETICCTPCT in the last 72 hours. Urine analysis:    Component Value Date/Time   COLORURINE YELLOW 12/13/2016 0133   APPEARANCEUR CLEAR 12/13/2016 0133   LABSPEC 1.018 12/13/2016 0133   PHURINE 5.0 12/13/2016 0133   GLUCOSEU 50 (A) 12/13/2016 0133   HGBUR MODERATE (A) 12/13/2016 0133   BILIRUBINUR NEGATIVE 12/13/2016 0133   KETONESUR NEGATIVE 12/13/2016 0133   PROTEINUR >=300 (A) 12/13/2016 0133   UROBILINOGEN 0.2 07/25/2014 1459   NITRITE NEGATIVE 12/13/2016 0133   LEUKOCYTESUR NEGATIVE 12/13/2016 0133   Sepsis Labs: @LABRCNTIP (procalcitonin:4,lacticidven:4) )No results found for this or any previous visit (from the past 240 hour(s)).       Radiology Studies: No results found.    Scheduled Meds: . amLODipine  10 mg Oral Daily  . aspirin  300 mg Rectal Daily   Or  . aspirin  325 mg Oral Daily  . atorvastatin  80 mg Oral q1800  . carvedilol  25 mg Oral BID WC  . enoxaparin (LOVENOX) injection  40 mg Subcutaneous Q24H  . insulin aspart  0-9 Units Subcutaneous TID WC  . irbesartan  150 mg Oral Daily   Continuous Infusions:   LOS: 3 days    Time spent in minutes: 35    Debbe Odea, MD [facility] Pager: [email or web address] Password [email or web address] 12/15/2016, 3:19 PM

## 2016-12-15 NOTE — Progress Notes (Signed)
Occupational Therapy Treatment Patient Details Name: [name] MRN: [id number] DOB: [birth year] Today's Date: 12/15/2016    History of present illness 51 y.o.malewith medical history significant ofDM, HTN, and a chronic brain mass that's likely a meningioma. Patient presents to the ED with c/o L sided weakness. CT head just shows chronic brain mass with vasogenic edema, essentially unchanged from a year ago. 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   OT comments  This 51 yo male admitted with above presents to acute OT making progress with sitting, standing, transfers, and vision but still struggling with use of right side and vision still playing a big role in the way he feels (dizziness). He will continue to benefit from acute OT with follow up OT on CIR.  Follow Up Recommendations  CIR;Supervision/Assistance - 24 hour    Equipment Recommendations  Other (comment)(TBD at next venue)    Recommendations for Other Services Rehab consult    Precautions / Restrictions Precautions Precautions: Fall Precaution Comments: watch BP (high) Restrictions Weight Bearing Restrictions: No       Mobility Bed Mobility Overal bed mobility: Needs Assistance Bed Mobility: Rolling;Sidelying to Sit Rolling: Supervision Sidelying to sit: Mod assist       General bed mobility comments: Able to bring LEs off bed, assist with trunk and to weight shift towards left to balance self. Used left bed rail to roll.  Transfers Overall transfer level: Needs assistance Equipment used: 2 person hand held assist Transfers: Stand Pivot Transfers Sit to Stand: Min assist;+2 physical assistance Stand pivot transfers: Max assist;+2 physical assistance       General transfer comment: Assist of 2 to power to standing using back of chair to pull up; Pt hyperextends left knee to stabilize. Stood from EOB x2. + dizziness with all movements secondary to  nystagmus and diplopia. Able to take a few steps to get to chair with cues for sequencing and advancing each LE, assist for weight shift and to prevent left knee from buckling with WB.    Balance Overall balance assessment: Needs assistance Sitting-balance support: Feet supported;No upper extremity supported Sitting balance-Leahy Scale: Fair Sitting balance - Comments: Able to initiate thoracic and lumbar extension for upright posture but only able to sustain for seconds and then fatigues. Requires external support during dynamic tasks.  Postural control: Left lateral lean Standing balance support: During functional activity;Bilateral upper extremity supported Standing balance-Leahy Scale: Poor Standing balance comment: Able to stand for ~5 mins with BUEs resting on back of chair and left knee hyperextended. Needs cues for upright posture as pt fatigues and leans on chair when fatigued. Weightshifts right/left and requires support through left knee to prevent buckling but able to activate quads.                           ADL either performed or assessed with clinical judgement   ADL Overall ADL's : Needs assistance/impaired                     Lower Body Dressing: Maximal assistance Lower Body Dressing Details (indicate cue type and reason): while seated EOB, pt needed total A to don left sock and Mod A to don right sock (could not get it started but could finish it) with RUE Toilet Transfer: Maximal assistance;+2 for physical assistance;Stand-pivot Toilet Transfer Details (indicate cue type and reason): bed>recliner going to pt's right  Vision Baseline Vision/History: Wears glasses Wears Glasses: At all times Patient Visual Report: Diplopia Vision Assessment?: Yes Ocular Range of Motion: Within Functional Limits Alignment/Gaze Preference: Within Defined Limits Tracking/Visual Pursuits: Decreased smoothness of horizontal tracking;Decreased  smoothness of vertical tracking;Decreased smoothness of eye movement to RIGHT superior field;Decreased smoothness of eye movement to LEFT superior field;Decreased smoothness of eye movement to RIGHT inferior field;Decreased smoothness of eye movement to LEFT inferior field(nystagmus in all directions (does disappate some when going to left)) Visual Fields: No apparent deficits Diplopia Assessment: Disappears with one eye closed;Objects split side to side;Present in near gaze;Present in far gaze Additional Comments: taped pt's glasses Bi nasal which helped negate double vision (asked him only to wear glasses when he was visually trying to focus on something (ie: watching tv, reading). I also gave him some gaze stabilization acitivies to do  (focusing on object centrally, left and right for 10 seconds each 2x/hour when he is awake)          Cognition Arousal/Alertness: Awake/alert Behavior During Therapy: WFL for tasks assessed/performed Overall Cognitive Status: Within Functional Limits for tasks assessed                                 General Comments: Cognition intact for basic mobility; will need further assessment. Laughs appropriately.               General Comments Supine BP 173/105, sitting BP 193/113, sitting post standing 196/124- RN notified.     Pertinent Vitals/ Pain       Pain Assessment: No/denies pain         Frequency  Min 2X/week        Progress Toward Goals  OT Goals(current goals can now be found in the care plan section)  Progress towards OT goals: Progressing toward goals     Plan Discharge plan remains appropriate    Co-evaluation    PT/OT/SLP Co-Evaluation/Treatment: Yes(partial) Reason for Co-Treatment: Complexity of the patient's impairments (multi-system involvement);For patient/therapist safety;To address functional/ADL transfers PT goals addressed during session: Mobility/safety with mobility;Balance;Strengthening/ROM         AM-PAC PT "6 Clicks" Daily Activity     Outcome Measure   Help from another person eating meals?: A Little Help from another person taking care of personal grooming?: A Little Help from another person toileting, which includes using toliet, bedpan, or urinal?: Total Help from another person bathing (including washing, rinsing, drying)?: A Lot Help from another person to put on and taking off regular upper body clothing?: A Lot Help from another person to put on and taking off regular lower body clothing?: Total 6 Click Score: 12    End of Session Equipment Utilized During Treatment: Gait belt  OT Visit Diagnosis: Other abnormalities of gait and mobility (R26.89);Hemiplegia and hemiparesis Hemiplegia - Right/Left: Left Hemiplegia - dominant/non-dominant: Non-Dominant Hemiplegia - caused by: Cerebral infarction   Activity Tolerance Patient tolerated treatment well   Patient Left in chair;with call bell/phone within reach;with family/visitor present           Time: 7829-5621 OT Time Calculation (min): 45 min  Charges: OT General Charges $OT Visit: 1 Visit OT Evaluation $OT Eval Moderate Complexity: 1 Mod OT Treatments $Self Care/Home Management : 8-22 mins  [place], OTR/L [phone] 12/15/2016

## 2016-12-16 ENCOUNTER — Inpatient Hospital Stay (HOSPITAL_COMMUNITY): Payer: Medicaid Other

## 2016-12-16 LAB — GLUCOSE, CAPILLARY
GLUCOSE-CAPILLARY: 103 mg/dL — AB (ref 65–99)
GLUCOSE-CAPILLARY: 109 mg/dL — AB (ref 65–99)
Glucose-Capillary: 143 mg/dL — ABNORMAL HIGH (ref 65–99)
Glucose-Capillary: 115 mg/dL — ABNORMAL HIGH (ref 65–99)

## 2016-12-16 NOTE — Progress Notes (Signed)
Modified Barium Swallow Progress Note  Patient Details  Name: [name] MRN: [id number] Date of Birth: 28-Oct-1965  Today's Date: 12/16/2016  Modified Barium Swallow completed.  Full report located under Chart Review in the Imaging Section.  Brief recommendations include the following:  Clinical Impression  Pt demonstrates functional oropharyngeal swallow with proficient/effective mastication, normal timing of pharyngeal swallow, high penetration with thin liquids, but rarely (penetration/aspiration scale 2), no aspiration despite large, consecutive boluses.  Pt consumed 13 mm barium pill without deficit.  Recommend advancing diet to regular, thin liquids.  Meds whole with water.    Swallow Evaluation Recommendations       SLP Diet Recommendations: Regular solids;Thin liquid   Liquid Administration via: Straw;Cup   Medication Administration: Whole meds with liquid   Supervision: Patient able to self feed           Oral Care Recommendations: Oral care BID        Juan Quam Laurice 12/16/2016,10:08 AM

## 2016-12-16 NOTE — Progress Notes (Signed)
PROGRESS NOTE    COLLAN SCHOENFELD   VOZ:[id number]  DOB: Apr 05, 1965  DOA: 12/12/2016 PCP: Glendale Chard, MD   Brief Narrative:  [name] 51 y.o. male with medical history significant of DM, HTN, and a chronic brain mass that's likely a meningioma.  Patient presents to the ED with c/o L sided weakness. BP 240/140  Subjective: No change in weakness. No other new complaints.  ROS: no complaints of nausea, vomiting, constipation diarrhea, cough, dyspnea or dysuria. No other complaints.   Assessment & Plan:   Principal Problem:   Acute ischemic stroke   - symptoms of left sided weakness and diplopia - ? Due to HTN- admits that he ran out of his Clonidine and ARB a few months ago - CT head: Stable right medial frontal region calcified mass and vasogenic edema in right frontal lobe. Stable bilateral anterior frontal lobe encephalomalacia. CTA: Moderate intracranial atherosclerotic disease MRI brain: Acute RIGHT paramedian brainstem infarct affecting lower pons and upper medulla... Heavily calcified mass, probably intra-axial, involves the medial RIGHT hemisphere, slightly increased from 2016. - 2 D ECHO: no thrombus - Lipids: Cholesterol 227, LDL 154, HDL 58- Lipitor started - A1c: 6.2 - SLP eval: regular solids, thin liquids - based on extent of weakness, he will need need CIR or SNF    Active Problems: Calcified mass in brain - chronic, appears to be increasing in size based on report-    Hypertension - severe - on Amlodipine at home- ran out of Valsartan and Clonidine  -12/1 resumed Irbesartan - 12/2- start Coreg  - cont to follow BP- currently at reasonable level  HLD - see above- Lipitor started  Proteinuria - ? Due to HTN     DM2 (diabetes mellitus, type 2)  - A1c controlled at 6.2- takes Janumet- cont SSI   DVT prophylaxis: Lovenox Code Status: Full code Family Communication: with wife Disposition Plan: follow on tele Consultants:    neuro Procedures:  2 d ECHO Study Conclusions  - Left ventricle: The cavity size was normal. Wall thickness was   increased in a pattern of mild LVH. There was severe focal basal   hypertrophy of the septum. Systolic function was normal. The   estimated ejection fraction was in the range of 60% to 65%. Wall   motion was normal; there were no regional wall motion   abnormalities. Doppler parameters are consistent with abnormal   left ventricular relaxation (grade 1 diastolic dysfunction). - Mitral valve: There was trivial regurgitation. Antimicrobials:  Anti-infectives (From admission, onward)   None       Objective: Vitals:   12/15/16 2105 12/16/16 0120 12/16/16 0640 12/16/16 1427  BP: (!) 149/91 (!) 176/105 (!) 187/107 (!) 156/86  Pulse: 86 77 79 82  Resp: 20 20 20 18   Temp:  98.5 F (36.9 C) 98.5 F (36.9 C) 98.5 F (36.9 C)  TempSrc: Oral Oral Oral Oral  SpO2: 97% 98% 98% 98%  Weight:      Height:        Intake/Output Summary (Last 24 hours) at 12/16/2016 1620 Last data filed at 12/16/2016 4259 Gross per 24 hour  Intake -  Output 250 ml  Net -250 ml   Filed Weights   12/12/16 2128  Weight: 99.8 kg (220 lb)    Examination: General exam: Appears comfortable  HEENT: PERRLA, oral mucosa moist, no sclera icterus or thrush Respiratory system: Clear to auscultation. Respiratory effort normal. Cardiovascular system: S1 & S2 heard, RRR.  No murmurs  Gastrointestinal system: Abdomen soft, non-tender, nondistended. Normal bowel sound. No organomegaly Central nervous system: Alert and oriented. Blurred vision and diplopia- left arm and leg strength is fluctuating from 1-2/ 5 Extremities: No cyanosis, clubbing or edema Skin: No rashes or ulcers Psychiatry:  Mood & affect appropriate.     Data Reviewed: I have personally reviewed following labs and imaging studies  CBC: Recent Labs  Lab 12/12/16 2148  WBC 6.5  HGB 13.3  HCT 39.4  MCV 74.6*  PLT 710    Basic Metabolic Panel: Recent Labs  Lab 12/12/16 2148  NA 140  K 3.4*  CL 106  CO2 22  GLUCOSE 132*  BUN 12  CREATININE 1.21  CALCIUM 9.8   GFR: Estimated Creatinine Clearance: 76.5 mL/min (by C-G formula based on SCr of 1.21 mg/dL). Liver Function Tests: No results for input(s): AST, ALT, ALKPHOS, BILITOT, PROT, ALBUMIN in the last 168 hours. No results for input(s): LIPASE, AMYLASE in the last 168 hours. No results for input(s): AMMONIA in the last 168 hours. Coagulation Profile: No results for input(s): INR, PROTIME in the last 168 hours. Cardiac Enzymes: No results for input(s): CKTOTAL, CKMB, CKMBINDEX, TROPONINI in the last 168 hours. BNP (last 3 results) No results for input(s): PROBNP in the last 8760 hours. HbA1C: No results for input(s): HGBA1C in the last 72 hours. CBG: Recent Labs  Lab 12/15/16 1230 12/15/16 1635 12/15/16 2108 12/16/16 0646 12/16/16 1131  GLUCAP 167* 120* 163* 103* 143*   Lipid Profile: No results for input(s): CHOL, HDL, LDLCALC, TRIG, CHOLHDL, LDLDIRECT in the last 72 hours. Thyroid Function Tests: No results for input(s): TSH, T4TOTAL, FREET4, T3FREE, THYROIDAB in the last 72 hours. Anemia Panel: No results for input(s): VITAMINB12, FOLATE, FERRITIN, TIBC, IRON, RETICCTPCT in the last 72 hours. Urine analysis:    Component Value Date/Time   COLORURINE YELLOW 12/13/2016 0133   APPEARANCEUR CLEAR 12/13/2016 0133   LABSPEC 1.018 12/13/2016 0133   PHURINE 5.0 12/13/2016 0133   GLUCOSEU 50 (A) 12/13/2016 0133   HGBUR MODERATE (A) 12/13/2016 0133   BILIRUBINUR NEGATIVE 12/13/2016 0133   KETONESUR NEGATIVE 12/13/2016 0133   PROTEINUR >=300 (A) 12/13/2016 0133   UROBILINOGEN 0.2 07/25/2014 1459   NITRITE NEGATIVE 12/13/2016 0133   LEUKOCYTESUR NEGATIVE 12/13/2016 0133   Sepsis Labs: @LABRCNTIP (procalcitonin:4,lacticidven:4) )No results found for this or any previous visit (from the past 240 hour(s)).       Radiology  Studies: Dg Swallowing Func-speech Pathology  Result Date: 12/16/2016 Objective Swallowing Evaluation: Type of Study: MBS-Modified Barium Swallow Study  Patient Details Name: [name] MRN: [id number] Date of Birth: 06/26/1965 Today's Date: 12/16/2016 Time: SLP Start Time (ACUTE ONLY): 0930 -SLP Stop Time (ACUTE ONLY): 1000 SLP Time Calculation (min) (ACUTE ONLY): 30 min Past Medical History: Past Medical History: Diagnosis Date . Colon cancer ([place])   rectal . Diabetes mellitus without complication ([place])  . Hypertension  . Seizure [facility])  Past Surgical History: Past Surgical History: Procedure Laterality Date . APPENDECTOMY   . JOINT REPLACEMENT Left 2004  Left hip . TOTAL HIP ARTHROPLASTY   HPI: Mark Zagal Whitakeris a 51 y.o.malewith medical history significant ofDM, HTN, and a chronic brain mass that's likely a meningioma. Patient presents to the ED with c/o L sided weakness. LKW when he went to bed last night. Woke up at 1AM with weakness. Went back to sleep. Still present when he woke up again in AM. MRI 12/13/16 showed Acute RIGHT paramedian brainstem infarct affecting lower pons and upper medulla.Heavily  calcified mass, probably intra-axial, involves the medial RIGHT hemisphere, slightly increased from 2016.  Subjective: alert Assessment / Plan / Recommendation CHL IP CLINICAL IMPRESSIONS 12/16/2016 Clinical Impression Pt demonstrates functional oropharyngeal swallow with proficient/effective mastication, normal timing of pharyngeal swallow, high penetration with thin liquids, but rarely (penetration/aspiration scale 2), no aspiration despite large, consecutive boluses.  Pt consumed 13 mm barium pill without deficit.  Recommend advancing diet to regular, thin liquids.  Meds whole with water.  SLP Visit Diagnosis Dysphagia, oropharyngeal phase (R13.12) Attention and concentration deficit following -- Frontal lobe and executive function deficit following -- Impact on safety and function Mild  aspiration risk   CHL IP TREATMENT RECOMMENDATION 12/16/2016 Treatment Recommendations No treatment recommended at this time   Prognosis 12/13/2016 Prognosis for Safe Diet Advancement Good Barriers to Reach Goals -- Barriers/Prognosis Comment -- CHL IP DIET RECOMMENDATION 12/16/2016 SLP Diet Recommendations Regular solids;Thin liquid Liquid Administration via Straw;Cup Medication Administration Whole meds with liquid Compensations -- Postural Changes --   CHL IP OTHER RECOMMENDATIONS 12/16/2016 Recommended Consults -- Oral Care Recommendations Oral care BID Other Recommendations --   CHL IP FOLLOW UP RECOMMENDATIONS 12/15/2016 Follow up Recommendations Inpatient Rehab   CHL IP FREQUENCY AND DURATION 12/14/2016 Speech Therapy Frequency (ACUTE ONLY) min 2x/week Treatment Duration --      CHL IP ORAL PHASE 12/16/2016 Oral Phase WFL Oral - Pudding Teaspoon -- Oral - Pudding Cup -- Oral - Honey Teaspoon -- Oral - Honey Cup -- Oral - Nectar Teaspoon -- Oral - Nectar Cup -- Oral - Nectar Straw -- Oral - Thin Teaspoon -- Oral - Thin Cup -- Oral - Thin Straw -- Oral - Puree -- Oral - Mech Soft -- Oral - Regular -- Oral - Multi-Consistency -- Oral - Pill -- Oral Phase - Comment --  CHL IP PHARYNGEAL PHASE 12/16/2016 Pharyngeal Phase WFL Pharyngeal- Pudding Teaspoon -- Pharyngeal -- Pharyngeal- Pudding Cup -- Pharyngeal -- Pharyngeal- Honey Teaspoon -- Pharyngeal -- Pharyngeal- Honey Cup -- Pharyngeal -- Pharyngeal- Nectar Teaspoon -- Pharyngeal -- Pharyngeal- Nectar Cup -- Pharyngeal -- Pharyngeal- Nectar Straw -- Pharyngeal -- Pharyngeal- Thin Teaspoon -- Pharyngeal -- Pharyngeal- Thin Cup -- Pharyngeal -- Pharyngeal- Thin Straw -- Pharyngeal -- Pharyngeal- Puree -- Pharyngeal -- Pharyngeal- Mechanical Soft -- Pharyngeal -- Pharyngeal- Regular -- Pharyngeal -- Pharyngeal- Multi-consistency -- Pharyngeal -- Pharyngeal- Pill -- Pharyngeal -- Pharyngeal Comment --  CHL IP CERVICAL ESOPHAGEAL PHASE 12/16/2016 Cervical Esophageal Phase  WFL Pudding Teaspoon -- Pudding Cup -- Honey Teaspoon -- Honey Cup -- Nectar Teaspoon -- Nectar Cup -- Nectar Straw -- Thin Teaspoon -- Thin Cup -- Thin Straw -- Puree -- Mechanical Soft -- Regular -- Multi-consistency -- Pill -- Cervical Esophageal Comment -- No flowsheet data found. Juan Quam Laurice 12/16/2016, 10:08 AM                 Scheduled Meds: . amLODipine  10 mg Oral Daily  . aspirin  300 mg Rectal Daily   Or  . aspirin  325 mg Oral Daily  . atorvastatin  80 mg Oral q1800  . carvedilol  25 mg Oral BID WC  . enoxaparin (LOVENOX) injection  40 mg Subcutaneous Q24H  . insulin aspart  0-9 Units Subcutaneous TID WC  . irbesartan  150 mg Oral Daily   Continuous Infusions:   LOS: 4 days    Time spent in minutes: 35    Debbe Odea, MD [facility] Pager: [email or web address] Password [email or web address] 12/16/2016, 4:20 PM

## 2016-12-16 NOTE — Progress Notes (Signed)
I received return call from pt's wife. She will seek clarification of caregiver support at home, but also wants SW to assess what SNF beds would be options if that is the route they needed to pursue. I iwll discuss with SW . [phone]

## 2016-12-16 NOTE — Progress Notes (Signed)
Physical Therapy Treatment Patient Details Name: [name] MRN: [id number] DOB: 23-Dec-1965 Today's Date: 12/16/2016    History of Present Illness 51 y.o.malewith medical history significant ofDM, HTN, and a chronic brain mass that's likely a meningioma. Patient presents to the ED with c/o L sided weakness. CT head just shows chronic brain mass with vasogenic edema, essentially unchanged from a year ago. 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    PT Comments    Patient progressing well towards PT goals. Continues to have dizziness throughout session secondary to diplopia and nystagmus. OT taped his glasses bi nasally and he reports this helps some. Some improvement noted with quality of standing today emphasizing WB through LUE/LE for all transitions. Tolerated SPT to chair with Max A of 2, cues for weight shifting and supporting left knee during stance phase to prevent buckling. Great CIR candidate. Will continue to follow.   Follow Up Recommendations  CIR     Equipment Recommendations  Other (comment)(defer to next venue)    Recommendations for Other Services       Precautions / Restrictions Precautions Precautions: Fall Precaution Comments: watch BP (high) Restrictions Weight Bearing Restrictions: No    Mobility  Bed Mobility Overal bed mobility: Needs Assistance Bed Mobility: Rolling;Sidelying to Sit Rolling: Supervision Sidelying to sit: Mod assist       General bed mobility comments: Able to bring LEs off bed, assist with trunk, providing WB through LUE and cues to reach with RUE to get upright. Used left bed rail to roll. Increased time and effort. +coughing spell during transfer.   Transfers Overall transfer level: Needs assistance Equipment used: 2 person hand held assist Transfers: Sit to/from [facility] Sit to Stand: Min assist;+2 physical assistance Stand pivot transfers: Max  assist;+2 physical assistance       General transfer comment: Assist of 2 to power to standing with cues to place Rt hand over left hand for WB through LLE upon standing/sitting; Pt hyperextends left knee to stabilize. Stood from [employer]. Able to take a few steps to get to chair with cues for sequencing, weight shifting and advancing each LE. PT stabilized left knee during weight bearing to prevent buckling.  Ambulation/Gait             General Gait Details: Deferred.    Stairs            Wheelchair Mobility    Modified Rankin (Stroke Patients Only) Modified Rankin (Stroke Patients Only) Pre-Morbid Rankin Score: No symptoms Modified Rankin: Moderately severe disability     Balance Overall balance assessment: Needs assistance Sitting-balance support: Feet supported;No upper extremity supported Sitting balance-Leahy Scale: Fair Sitting balance - Comments: Able to initiate thoracic and lumbar extension for upright posture but only able to sustain for seconds and then fatigues.  Postural control: Left lateral lean Standing balance support: During functional activity Standing balance-Leahy Scale: Poor Standing balance comment: Able to stand statically with external support of 2. Cues for upright posture. Left knee hyperextended to support weight and prevent buckling. Weighshifting done with Mod A of 2. Fatigues.                             Cognition Arousal/Alertness: Awake/alert Behavior During Therapy: WFL for tasks assessed/performed Overall Cognitive Status: Within Functional Limits for tasks assessed  General Comments: Cognition intact for basic mobility; will need further assessment. Laughs appropriately.       Exercises      General Comments        Pertinent Vitals/Pain Pain Assessment: No/denies pain    Home Living                      Prior Function            PT Goals (current  goals can now be found in the care plan section) Progress towards PT goals: Progressing toward goals    Frequency    Min 4X/week      PT Plan Current plan remains appropriate    Co-evaluation              AM-PAC PT "6 Clicks" Daily Activity  Outcome Measure  Difficulty turning over in bed (including adjusting bedclothes, sheets and blankets)?: None Difficulty moving from lying on back to sitting on the side of the bed? : Unable Difficulty sitting down on and standing up from a chair with arms (e.g., wheelchair, bedside commode, etc,.)?: Unable Help needed moving to and from a bed to chair (including a wheelchair)?: A Lot Help needed walking in hospital room?: Total Help needed climbing 3-5 steps with a railing? : Total 6 Click Score: 10    End of Session Equipment Utilized During Treatment: Gait belt Activity Tolerance: Patient tolerated treatment well Patient left: in chair;with call bell/phone within reach Nurse Communication: Mobility status;Need for lift equipment PT Visit Diagnosis: Other symptoms and signs involving the nervous system (I09.735)     Time: 3299-2426 PT Time Calculation (min) (ACUTE ONLY): 21 min  Charges:  $Therapeutic Activity: 8-22 mins                    G Codes:       Wray Kearns, PT, DPT [phone]     Marguarite Arbour A Tericka Devincenzi 12/16/2016, 3:28 PM

## 2016-12-17 LAB — GLUCOSE, CAPILLARY
Glucose-Capillary: 118 mg/dL — ABNORMAL HIGH (ref 65–99)
Glucose-Capillary: 107 mg/dL — ABNORMAL HIGH (ref 65–99)
Glucose-Capillary: 122 mg/dL — ABNORMAL HIGH (ref 65–99)
Glucose-Capillary: 159 mg/dL — ABNORMAL HIGH (ref 65–99)

## 2016-12-17 NOTE — Progress Notes (Signed)
I met with pt and then called his wife by phone to follow up on our previous conversations concerning caregiver support after a rehab stay. Wife and pt state they have no assistance when she works 8 until 5 pm daily. Wife states he will need SNF as discussed with SW in [place] or [place]. I have contacted SW and she is aware. We will sign off at this time. [phone]

## 2016-12-17 NOTE — Progress Notes (Signed)
Occupational Therapy Treatment Patient Details Name: [name] MRN: [id number] DOB: 1965-09-26 Today's Date: 12/17/2016    History of present illness 51 y.o.malewith medical history significant ofDM, HTN, and a chronic brain mass that's likely a meningioma. Patient presents to the ED with c/o L sided weakness. CT head just shows chronic brain mass with vasogenic edema, essentially unchanged from a year ago. 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   OT comments  Pt making good progress toward OT goals; pt able to perform functional mobility from EOB to sink with max assist +2 HHA and max multimodal cues for posture and sequencing. Pt tolerated standing at sink x5 minutes to complete grooming tasks x2 with mod assist. D/c plan remains appropriate. Will continue to follow acutely.   Follow Up Recommendations  CIR;Supervision/Assistance - 24 hour    Equipment Recommendations  Other (comment)(TBD at next venue)    Recommendations for Other Services      Precautions / Restrictions Precautions Precautions: Fall Precaution Comments: watch BP (high) Restrictions Weight Bearing Restrictions: No       Mobility Bed Mobility Overal bed mobility: Needs Assistance Bed Mobility: Rolling;Sidelying to Sit Rolling: Supervision Sidelying to sit: Min guard       General bed mobility comments: Able to initate LEs to EOB and utilize rail to pull to upright  Transfers Overall transfer level: Needs assistance Equipment used: 2 person hand held assist Transfers: Sit to/from Stand Sit to Stand: Min assist;Mod assist;+2 physical assistance         General transfer comment: +2 Min assist to power up from bed, moderate assist from chair with Manual placement of LUE and bilateral assist.    Balance Overall balance assessment: Needs assistance Sitting-balance support: Feet supported;No upper extremity supported Sitting  balance-Leahy Scale: Fair Sitting balance - Comments: Able to initiate thoracic and lumbar extension for upright posture but only able to sustain for seconds and then fatigues.  Postural control: Left lateral lean Standing balance support: During functional activity Standing balance-Leahy Scale: Poor Standing balance comment: Able to stand statically with external support of 2. Cues for upright posture. Left knee hyperextended to support weight and prevent buckling. Weighshifting done with Mod A of 2. Fatigues.                            ADL either performed or assessed with clinical judgement   ADL Overall ADL's : Needs assistance/impaired     Grooming: Moderate assistance;Standing;Wash/dry face;Oral care Grooming Details (indicate cue type and reason): Assist for toothpaste and balance in standing. Cues for upright posture                 Toilet Transfer: Maximal assistance;+2 for physical assistance;Ambulation Toilet Transfer Details (indicate cue type and reason): Simulated by sit to stand from EOB with functional mobility in room.         Functional mobility during ADLs: Maximal assistance;+2 for physical assistance(2 person HHA)       Vision       Perception     Praxis      Cognition Arousal/Alertness: Awake/alert Behavior During Therapy: WFL for tasks assessed/performed Overall Cognitive Status: Within Functional Limits for tasks assessed  Exercises Exercises: Other exercises Other Exercises Other Exercises: Bil shoulder shrugs x5, LUE shoulder flexion AAROM x5, PROM at elbow x5, digits x5   Shoulder Instructions       General Comments      Pertinent Vitals/ Pain       Pain Assessment: No/denies pain  Home Living                                          Prior Functioning/Environment              Frequency  Min 2X/week        Progress Toward  Goals  OT Goals(current goals can now be found in the care plan section)  Progress towards OT goals: Progressing toward goals  Acute Rehab OT Goals Patient Stated Goal: To get better  OT Goal Formulation: With patient  Plan Discharge plan remains appropriate    Co-evaluation    PT/OT/SLP Co-Evaluation/Treatment: Yes Reason for Co-Treatment: Complexity of the patient's impairments (multi-system involvement) PT goals addressed during session: Mobility/safety with mobility OT goals addressed during session: ADL's and self-care      AM-PAC PT "6 Clicks" Daily Activity     Outcome Measure   Help from another person eating meals?: A Little Help from another person taking care of personal grooming?: A Lot Help from another person toileting, which includes using toliet, bedpan, or urinal?: A Lot Help from another person bathing (including washing, rinsing, drying)?: A Lot Help from another person to put on and taking off regular upper body clothing?: A Lot Help from another person to put on and taking off regular lower body clothing?: A Lot 6 Click Score: 13    End of Session Equipment Utilized During Treatment: Gait belt  OT Visit Diagnosis: Other abnormalities of gait and mobility (R26.89);Hemiplegia and hemiparesis Hemiplegia - Right/Left: Left Hemiplegia - dominant/non-dominant: Non-Dominant Hemiplegia - caused by: Cerebral infarction   Activity Tolerance Patient tolerated treatment well   Patient Left in chair;with call bell/phone within reach;with chair alarm set   Nurse Communication          Time: 0223-3612 OT Time Calculation (min): 24 min  Charges: OT General Charges $OT Visit: 1 Visit OT Treatments $Self Care/Home Management : 8-22 mins  Charlsey Moragne A. Ulice Brilliant, M.S., OTR/L Pager: [place] 12/17/2016, 9:08 AM

## 2016-12-17 NOTE — Progress Notes (Signed)
Physical Therapy Treatment Patient Details Name: [name] MRN: [id number] DOB: Apr 27, 1965 Today's Date: 12/17/2016    History of Present Illness 51 y.o.malewith medical history significant ofDM, HTN, and a chronic brain mass that's likely a meningioma. Patient presents to the ED with c/o L sided weakness. CT head just shows chronic brain mass with vasogenic edema, essentially unchanged from a year ago. 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    PT Comments    Patient demonstrates progress towards PT goals today. Was able to initiate gait training and extended upright activities today. Patient also tolerating generalized therapeutic exercise. Overall continues to require increased physical assist for most aspects of mobility. Continue to feel current POC remains appropriate.   Follow Up Recommendations  CIR     Equipment Recommendations  Other (comment)(defer to next venue)    Recommendations for Other Services Rehab consult     Precautions / Restrictions Precautions Precautions: Fall Precaution Comments: watch BP (high) Restrictions Weight Bearing Restrictions: No    Mobility  Bed Mobility Overal bed mobility: Needs Assistance Bed Mobility: Rolling;Sidelying to Sit Rolling: Supervision Sidelying to sit: Min guard       General bed mobility comments: Able to initate LEs to EOB and utilize rail to pull to upright  Transfers Overall transfer level: Needs assistance Equipment used: 2 person hand held assist Transfers: Sit to/from Stand Sit to Stand: Min assist;Mod assist;+2 physical assistance         General transfer comment: +2 Min assist to power up from bed, moderate assist from chair with Manual placement of LUE and bilateral assist.  Ambulation/Gait Ambulation/Gait assistance: Max assist;+2 physical assistance Ambulation Distance (Feet): 8 Feet Assistive device: 2 person hand held assist Gait  Pattern/deviations: Step-to pattern;Decreased stride length;Trunk flexed(wrap around support) Gait velocity: decreased Gait velocity interpretation: <1.8 ft/sec, indicative of risk for recurrent falls General Gait Details: facilitation techniques required to initiate stride, Max multi modal cues for upright and weight shift bilaterally. Increased physical assist to block LLE and maintain posture and positioning during gait   Stairs            Wheelchair Mobility    Modified Rankin (Stroke Patients Only) Modified Rankin (Stroke Patients Only) Pre-Morbid Rankin Score: No symptoms Modified Rankin: Moderately severe disability     Balance Overall balance assessment: Needs assistance Sitting-balance support: Feet supported;No upper extremity supported Sitting balance-Leahy Scale: Fair Sitting balance - Comments: Able to initiate thoracic and lumbar extension for upright posture but only able to sustain for seconds and then fatigues.  Postural control: Left lateral lean Standing balance support: During functional activity Standing balance-Leahy Scale: Poor Standing balance comment: Able to stand statically with external support of 2. Cues for upright posture. Left knee hyperextended to support weight and prevent buckling. Weighshifting done with Mod A of 2. Fatigues.                             Cognition Arousal/Alertness: Awake/alert Behavior During Therapy: WFL for tasks assessed/performed Overall Cognitive Status: Within Functional Limits for tasks assessed                                 General Comments: Cognition intact for basic mobility; will need further assessment. Laughs appropriately.       Exercises Other Exercises Other Exercises: Bil shoulder shrugs x5, LUE shoulder flexion  AAROM x5, PROM at elbow x5, digits x5 Other Exercises: Bilateral LEs Long Arc quats with facilitation for LLE    General Comments        Pertinent Vitals/Pain  Pain Assessment: No/denies pain    Home Living                      Prior Function            PT Goals (current goals can now be found in the care plan section) Acute Rehab PT Goals Patient Stated Goal: To get better  PT Goal Formulation: With patient Time For Goal Achievement: 12/27/16 Potential to Achieve Goals: Good Progress towards PT goals: Progressing toward goals    Frequency    Min 4X/week      PT Plan Current plan remains appropriate    Co-evaluation PT/OT/SLP Co-Evaluation/Treatment: Yes Reason for Co-Treatment: Complexity of the patient's impairments (multi-system involvement) PT goals addressed during session: Mobility/safety with mobility OT goals addressed during session: ADL's and self-care      AM-PAC PT "6 Clicks" Daily Activity  Outcome Measure  Difficulty turning over in bed (including adjusting bedclothes, sheets and blankets)?: None Difficulty moving from lying on back to sitting on the side of the bed? : Unable Difficulty sitting down on and standing up from a chair with arms (e.g., wheelchair, bedside commode, etc,.)?: Unable Help needed moving to and from a bed to chair (including a wheelchair)?: A Lot Help needed walking in hospital room?: Total Help needed climbing 3-5 steps with a railing? : Total 6 Click Score: 10    End of Session Equipment Utilized During Treatment: Gait belt Activity Tolerance: Patient tolerated treatment well Patient left: in chair;with call bell/phone within reach Nurse Communication: Mobility status;Need for lift equipment PT Visit Diagnosis: Other symptoms and signs involving the nervous system (T06.269)     Time: 4854-6270 PT Time Calculation (min) (ACUTE ONLY): 24 min  Charges:  $Therapeutic Activity: 8-22 mins                    G Codes:       Alben Deeds, PT DPT  Board Certified Neurologic Specialist [place] 12/17/2016, 9:15 AM

## 2016-12-17 NOTE — Clinical Social Work Note (Signed)
Clinical Social Work Assessment  Patient Details  Name: [name] MRN: [id number] Date of Birth: [birth year]  Date of referral:  12/16/16               Reason for consult:  Facility Placement                Permission sought to share information with:  Facility Sport and exercise psychologist, Family Supports Permission granted to share information::  Yes, Verbal Permission Granted  Name::     Astronomer::  SNF  Relationship::  Wife  Contact Information:     Housing/Transportation Living arrangements for the past 2 months:  Single Family Home Source of Information:  Spouse Patient Interpreter Needed:  None Criminal Activity/Legal Involvement Pertinent to Current Situation/Hospitalization:  No - Comment as needed Significant Relationships:  Spouse, Dependent Children Lives with:  Self, Spouse, Minor Children Do you feel safe going back to the place where you live?  Yes Need for family participation in patient care:  No (Coment)  Care giving concerns:  Patient has been living at home with spouse and daughter, working part-time, but now requires short term rehab to recover from stroke and improve ability to care for self with wife's support at discharge.   Social Worker assessment / plan:  CSW had previously faxed out referral for SNF placement under LOG, if needed, as a back-up to CIR. CSW notified by CIR Admission Coordinator that wife would like to discuss options, to have all information available while making decision. CSW spoke with patient's wife about SNF options as a back-up to CIR placement. CSW explained LOG process, and the need to also be able to provide LOG for the therapy for the patient to recover from stroke. CSW explained that the facilities will likely not be local. CSW explained differences between SNF rehab and CIR rehab in improving the patient's ability to care for self. CSW indicated that there were no official bed offers for SNF at this time, and would continue  to work on it. CSW will continue to follow.  Employment status:  Systems developer information:  Self Pay (Medicaid Pending) PT Recommendations:  Inpatient Rehab Consult Information / Referral to community resources:  [place]  Patient/Family's Response to care:  Patient and wife agreeable to SNF placement, if needed, if they are not able to figure out how to coordinate 24/7 care for the patient at discharge from [facility].  Patient/Family's Understanding of and Emotional Response to Diagnosis, Current Treatment, and Prognosis:  Patient's wife was appreciative of information provided by CSW during discussion. Patient's wife acknowledged feeling overwhelmed with everything going on, and confused about what the best course of action would be for the patient at this time. Patient's wife discussed how there's a lot that she needs to think about and figure out what she wants to do, and will follow up with decision after she thinks and talks it over with the patient and family.  Emotional Assessment Appearance:  Appears stated age Attitude/Demeanor/Rapport:    Affect (typically observed):  Appropriate, Overwhelmed Orientation:  Oriented to Self, Oriented to Place, Oriented to  Time, Oriented to Situation Alcohol / Substance use:  Not Applicable Psych involvement (Current and /or in the community):  No (Comment)  Discharge Needs  Concerns to be addressed:  Care Coordination, Financial / Insurance Concerns Readmission within the last 30 days:  No Current discharge risk:  Physical Impairment, Dependent with Mobility Barriers to Discharge:  Continued Medical Work up, Inadequate or  no insurance   [place], [place] 12/17/2016, 9:09 AM

## 2016-12-17 NOTE — Progress Notes (Signed)
PROGRESS NOTE    [name]   YSA:[id number]  DOB: Oct 19, 1965  DOA: 12/12/2016 PCP: Glendale Chard, MD   Brief Narrative:  [name] 51 y.o. male with medical history significant of DM, HTN, and a chronic brain mass that's likely a meningioma.  Patient presents to the ED with c/o L sided weakness. BP 240/140  Subjective: Pt has no new complaints. No acute issues overnight.  Assessment & Plan:   Principal Problem:   Acute ischemic stroke   - symptoms of left sided weakness and diplopia - Due to HTN- admits that he ran out of his Clonidine and ARB a few months ago - CT head: Stable right medial frontal region calcified mass and vasogenic edema in right frontal lobe. Stable bilateral anterior frontal lobe encephalomalacia. CTA: Moderate intracranial atherosclerotic disease MRI brain: Acute RIGHT paramedian brainstem infarct affecting lower pons and upper medulla. Heavily calcified mass, probably intra-axial, involves the medial RIGHT hemisphere, slightly increased from 2016. - 2 D ECHO: no thrombus - Lipids: Cholesterol 227, LDL 154, HDL 58- Lipitor started - A1c: 6.2 - SLP eval: regular solids, thin liquids - based on extent of weakness, he will need need CIR or SNF    Active Problems: Calcified mass in brain - chronic, appears to be increasing in size based on report   Hypertension - severe - on Amlodipine at home- ran out of Valsartan and Clonidine  -12/1 resumed Irbesartan - 12/2- start Coreg  - cont to follow BP- currently at reasonable level  HLD - see above- Lipitor started  Proteinuria - suspecting etiology secondary to HTN     DM2 (diabetes mellitus, type 2)  - A1c controlled at 6.2- takes Janumet- cont SSI   DVT prophylaxis: Lovenox Code Status: Full code Family Communication: with wife Disposition Plan: follow on tele Consultants:   neuro Procedures:  2 d ECHO Study Conclusions  - Left ventricle: The cavity size was normal.  Wall thickness was   increased in a pattern of mild LVH. There was severe focal basal   hypertrophy of the septum. Systolic function was normal. The   estimated ejection fraction was in the range of 60% to 65%. Wall   motion was normal; there were no regional wall motion   abnormalities. Doppler parameters are consistent with abnormal   left ventricular relaxation (grade 1 diastolic dysfunction). - Mitral valve: There was trivial regurgitation.  Antimicrobials:  Anti-infectives (From admission, onward)   None       Objective: Vitals:   12/17/16 0150 12/17/16 0600 12/17/16 0910 12/17/16 1446  BP: (!) 148/88 (!) 150/88 (!) 165/102 (!) 146/96  Pulse:  82 91 98  Resp:  17 18 20   Temp:  98.8 F (37.1 C) 99.1 F (37.3 C) 98.5 F (36.9 C)  TempSrc:  Oral Oral Oral  SpO2:  97% 98% 99%  Weight:      Height:        Intake/Output Summary (Last 24 hours) at 12/17/2016 1758 Last data filed at 12/17/2016 0600 Gross per 24 hour  Intake 600 ml  Output 1100 ml  Net -500 ml   Filed Weights   12/12/16 2128  Weight: 99.8 kg (220 lb)    Examination: General exam: Appears comfortable, in nad. HEENT: PERRLA, oral mucosa moist, no sclera icterus or thrush Respiratory system: Clear to auscultation. Respiratory effort normal. Cardiovascular system: S1 & S2 heard, RRR.  No murmurs  Gastrointestinal system: Abdomen soft, non-tender, nondistended. Normal bowel sound. No organomegaly Central nervous  system: Alert and oriented. Blurred vision and diplopia- left arm and leg strength is fluctuating from 1-2/ 5 Extremities: No cyanosis, clubbing or edema Skin: No rashes or ulcers Psychiatry:  Mood & affect appropriate.   Data Reviewed: I have personally reviewed following labs and imaging studies  CBC: Recent Labs  Lab 12/12/16 2148  WBC 6.5  HGB 13.3  HCT 39.4  MCV 74.6*  PLT 191   Basic Metabolic Panel: Recent Labs  Lab 12/12/16 2148  NA 140  K 3.4*  CL 106  CO2 22  GLUCOSE  132*  BUN 12  CREATININE 1.21  CALCIUM 9.8   GFR: Estimated Creatinine Clearance: 76.5 mL/min (by C-G formula based on SCr of 1.21 mg/dL). Liver Function Tests: No results for input(s): AST, ALT, ALKPHOS, BILITOT, PROT, ALBUMIN in the last 168 hours. No results for input(s): LIPASE, AMYLASE in the last 168 hours. No results for input(s): AMMONIA in the last 168 hours. Coagulation Profile: No results for input(s): INR, PROTIME in the last 168 hours. Cardiac Enzymes: No results for input(s): CKTOTAL, CKMB, CKMBINDEX, TROPONINI in the last 168 hours. BNP (last 3 results) No results for input(s): PROBNP in the last 8760 hours. HbA1C: No results for input(s): HGBA1C in the last 72 hours. CBG: Recent Labs  Lab 12/16/16 1622 12/16/16 2126 12/17/16 0629 12/17/16 1119 12/17/16 1619  GLUCAP 115* 109* 118* 107* 122*   Lipid Profile: No results for input(s): CHOL, HDL, LDLCALC, TRIG, CHOLHDL, LDLDIRECT in the last 72 hours. Thyroid Function Tests: No results for input(s): TSH, T4TOTAL, FREET4, T3FREE, THYROIDAB in the last 72 hours. Anemia Panel: No results for input(s): VITAMINB12, FOLATE, FERRITIN, TIBC, IRON, RETICCTPCT in the last 72 hours. Urine analysis:    Component Value Date/Time   COLORURINE YELLOW 12/13/2016 0133   APPEARANCEUR CLEAR 12/13/2016 0133   LABSPEC 1.018 12/13/2016 0133   PHURINE 5.0 12/13/2016 0133   GLUCOSEU 50 (A) 12/13/2016 0133   HGBUR MODERATE (A) 12/13/2016 0133   BILIRUBINUR NEGATIVE 12/13/2016 0133   KETONESUR NEGATIVE 12/13/2016 0133   PROTEINUR >=300 (A) 12/13/2016 0133   UROBILINOGEN 0.2 07/25/2014 1459   NITRITE NEGATIVE 12/13/2016 0133   LEUKOCYTESUR NEGATIVE 12/13/2016 0133   Sepsis Labs: @LABRCNTIP (procalcitonin:4,lacticidven:4) )No results found for this or any previous visit (from the past 240 hour(s)).       Radiology Studies: Dg Swallowing Func-speech Pathology  Result Date: 12/16/2016 Objective Swallowing Evaluation: Type of  Study: MBS-Modified Barium Swallow Study  Patient Details Name: [name] MRN: [id number] Date of Birth: 1965/03/08 Today's Date: 12/16/2016 Time: SLP Start Time (ACUTE ONLY): 0930 -SLP Stop Time (ACUTE ONLY): 1000 SLP Time Calculation (min) (ACUTE ONLY): 30 min Past Medical History: Past Medical History: Diagnosis Date . Colon cancer ([place])   rectal . Diabetes mellitus without complication ([place])  . Hypertension  . Seizure [facility])  Past Surgical History: Past Surgical History: Procedure Laterality Date . APPENDECTOMY   . JOINT REPLACEMENT Left 2004  Left hip . TOTAL HIP ARTHROPLASTY   HPI: [name] a 51 y.o.malewith medical history significant ofDM, HTN, and a chronic brain mass that's likely a meningioma. Patient presents to the ED with c/o L sided weakness. LKW when he went to bed last night. Woke up at 1AM with weakness. Went back to sleep. Still present when he woke up again in AM. MRI 12/13/16 showed Acute RIGHT paramedian brainstem infarct affecting lower pons and upper medulla.Heavily calcified mass, probably intra-axial, involves the medial RIGHT hemisphere, slightly increased from 2016.  Subjective:  alert Assessment / Plan / Recommendation CHL IP CLINICAL IMPRESSIONS 12/16/2016 Clinical Impression Pt demonstrates functional oropharyngeal swallow with proficient/effective mastication, normal timing of pharyngeal swallow, high penetration with thin liquids, but rarely (penetration/aspiration scale 2), no aspiration despite large, consecutive boluses.  Pt consumed 13 mm barium pill without deficit.  Recommend advancing diet to regular, thin liquids.  Meds whole with water.  SLP Visit Diagnosis Dysphagia, oropharyngeal phase (R13.12) Attention and concentration deficit following -- Frontal lobe and executive function deficit following -- Impact on safety and function Mild aspiration risk   CHL IP TREATMENT RECOMMENDATION 12/16/2016 Treatment Recommendations No treatment recommended at this  time   Prognosis 12/13/2016 Prognosis for Safe Diet Advancement Good Barriers to Reach Goals -- Barriers/Prognosis Comment -- CHL IP DIET RECOMMENDATION 12/16/2016 SLP Diet Recommendations Regular solids;Thin liquid Liquid Administration via Straw;Cup Medication Administration Whole meds with liquid Compensations -- Postural Changes --   CHL IP OTHER RECOMMENDATIONS 12/16/2016 Recommended Consults -- Oral Care Recommendations Oral care BID Other Recommendations --   CHL IP FOLLOW UP RECOMMENDATIONS 12/15/2016 Follow up Recommendations Inpatient Rehab   CHL IP FREQUENCY AND DURATION 12/14/2016 Speech Therapy Frequency (ACUTE ONLY) min 2x/week Treatment Duration --      CHL IP ORAL PHASE 12/16/2016 Oral Phase WFL Oral - Pudding Teaspoon -- Oral - Pudding Cup -- Oral - Honey Teaspoon -- Oral - Honey Cup -- Oral - Nectar Teaspoon -- Oral - Nectar Cup -- Oral - Nectar Straw -- Oral - Thin Teaspoon -- Oral - Thin Cup -- Oral - Thin Straw -- Oral - Puree -- Oral - Mech Soft -- Oral - Regular -- Oral - Multi-Consistency -- Oral - Pill -- Oral Phase - Comment --  CHL IP PHARYNGEAL PHASE 12/16/2016 Pharyngeal Phase WFL Pharyngeal- Pudding Teaspoon -- Pharyngeal -- Pharyngeal- Pudding Cup -- Pharyngeal -- Pharyngeal- Honey Teaspoon -- Pharyngeal -- Pharyngeal- Honey Cup -- Pharyngeal -- Pharyngeal- Nectar Teaspoon -- Pharyngeal -- Pharyngeal- Nectar Cup -- Pharyngeal -- Pharyngeal- Nectar Straw -- Pharyngeal -- Pharyngeal- Thin Teaspoon -- Pharyngeal -- Pharyngeal- Thin Cup -- Pharyngeal -- Pharyngeal- Thin Straw -- Pharyngeal -- Pharyngeal- Puree -- Pharyngeal -- Pharyngeal- Mechanical Soft -- Pharyngeal -- Pharyngeal- Regular -- Pharyngeal -- Pharyngeal- Multi-consistency -- Pharyngeal -- Pharyngeal- Pill -- Pharyngeal -- Pharyngeal Comment --  CHL IP CERVICAL ESOPHAGEAL PHASE 12/16/2016 Cervical Esophageal Phase WFL Pudding Teaspoon -- Pudding Cup -- Honey Teaspoon -- Honey Cup -- Nectar Teaspoon -- Nectar Cup -- Nectar Straw --  Thin Teaspoon -- Thin Cup -- Thin Straw -- Puree -- Mechanical Soft -- Regular -- Multi-consistency -- Pill -- Cervical Esophageal Comment -- No flowsheet data found. Juan Quam Laurice 12/16/2016, 10:08 AM               Scheduled Meds: . amLODipine  10 mg Oral Daily  . aspirin  300 mg Rectal Daily   Or  . aspirin  325 mg Oral Daily  . atorvastatin  80 mg Oral q1800  . carvedilol  25 mg Oral BID WC  . enoxaparin (LOVENOX) injection  40 mg Subcutaneous Q24H  . insulin aspart  0-9 Units Subcutaneous TID WC  . irbesartan  150 mg Oral Daily   Continuous Infusions:   LOS: 5 days    Time spent in minutes: 35  Velvet Bathe, MD [facility] Pager: [email or web address] Password [email or web address] 12/17/2016, 5:58 PM

## 2016-12-18 ENCOUNTER — Other Ambulatory Visit: Payer: Self-pay

## 2016-12-18 ENCOUNTER — Encounter (HOSPITAL_COMMUNITY): Payer: Self-pay

## 2016-12-18 ENCOUNTER — Inpatient Hospital Stay (HOSPITAL_COMMUNITY)
Admission: RE | Admit: 2016-12-18 | Discharge: 2017-01-08 | DRG: 057 | Disposition: A | Discharge status: Home Health | Payer: Medicaid Other | Source: Intra-hospital | Attending: Physical Medicine & Rehabilitation | Admitting: Physical Medicine & Rehabilitation

## 2016-12-18 DIAGNOSIS — R7309 Other abnormal glucose: Secondary | ICD-10-CM

## 2016-12-18 DIAGNOSIS — Z85038 Personal history of other malignant neoplasm of large intestine: Secondary | ICD-10-CM | POA: Diagnosis not present

## 2016-12-18 DIAGNOSIS — E785 Hyperlipidemia, unspecified: Secondary | ICD-10-CM | POA: Diagnosis present

## 2016-12-18 DIAGNOSIS — I10 Essential (primary) hypertension: Secondary | ICD-10-CM | POA: Diagnosis present

## 2016-12-18 DIAGNOSIS — R0989 Other specified symptoms and signs involving the circulatory and respiratory systems: Secondary | ICD-10-CM

## 2016-12-18 DIAGNOSIS — Z96642 Presence of left artificial hip joint: Secondary | ICD-10-CM | POA: Diagnosis present

## 2016-12-18 DIAGNOSIS — I69354 Hemiplegia and hemiparesis following cerebral infarction affecting left non-dominant side: Principal | ICD-10-CM

## 2016-12-18 DIAGNOSIS — E1142 Type 2 diabetes mellitus with diabetic polyneuropathy: Secondary | ICD-10-CM | POA: Diagnosis present

## 2016-12-18 DIAGNOSIS — D62 Acute posthemorrhagic anemia: Secondary | ICD-10-CM

## 2016-12-18 DIAGNOSIS — R1310 Dysphagia, unspecified: Secondary | ICD-10-CM | POA: Diagnosis present

## 2016-12-18 DIAGNOSIS — I69398 Other sequelae of cerebral infarction: Secondary | ICD-10-CM

## 2016-12-18 DIAGNOSIS — I69391 Dysphagia following cerebral infarction: Secondary | ICD-10-CM | POA: Diagnosis not present

## 2016-12-18 DIAGNOSIS — E1151 Type 2 diabetes mellitus with diabetic peripheral angiopathy without gangrene: Secondary | ICD-10-CM | POA: Diagnosis present

## 2016-12-18 DIAGNOSIS — Z833 Family history of diabetes mellitus: Secondary | ICD-10-CM | POA: Diagnosis not present

## 2016-12-18 DIAGNOSIS — G8194 Hemiplegia, unspecified affecting left nondominant side: Secondary | ICD-10-CM | POA: Diagnosis present

## 2016-12-18 DIAGNOSIS — Z79899 Other long term (current) drug therapy: Secondary | ICD-10-CM | POA: Diagnosis not present

## 2016-12-18 DIAGNOSIS — N319 Neuromuscular dysfunction of bladder, unspecified: Secondary | ICD-10-CM

## 2016-12-18 DIAGNOSIS — I6350 Cerebral infarction due to unspecified occlusion or stenosis of unspecified cerebral artery: Secondary | ICD-10-CM | POA: Diagnosis present

## 2016-12-18 DIAGNOSIS — I69998 Other sequelae following unspecified cerebrovascular disease: Secondary | ICD-10-CM

## 2016-12-18 DIAGNOSIS — E1159 Type 2 diabetes mellitus with other circulatory complications: Secondary | ICD-10-CM

## 2016-12-18 LAB — GLUCOSE, CAPILLARY
GLUCOSE-CAPILLARY: 135 mg/dL — AB (ref 65–99)
GLUCOSE-CAPILLARY: 133 mg/dL — AB (ref 65–99)
Glucose-Capillary: 129 mg/dL — ABNORMAL HIGH (ref 65–99)

## 2016-12-18 LAB — CBC
HEMATOCRIT: 39 % (ref 39.0–52.0)
HEMOGLOBIN: 12.9 g/dL — AB (ref 13.0–17.0)
MCH: 25.1 pg — AB (ref 26.0–34.0)
MCHC: 33.1 g/dL (ref 30.0–36.0)
MCV: 75.9 fL — ABNORMAL LOW (ref 78.0–100.0)
Platelets: 315 10*3/uL (ref 150–400)
RBC: 5.14 MIL/uL (ref 4.22–5.81)
RDW: 15 % (ref 11.5–15.5)
WBC: 7.1 10*3/uL (ref 4.0–10.5)

## 2016-12-18 LAB — CREATININE, SERUM
Creatinine, Ser: 1.18 mg/dL (ref 0.61–1.24)
GFR calc non Af Amer: >60 mL/min (ref >60)

## 2016-12-18 MED ORDER — PHENOL 1.4 % MT LIQD
1.0000 | OROMUCOSAL | Status: DC | PRN
  Administered 2016-12-18: 1 via OROMUCOSAL
  Filled 2016-12-18: qty 177

## 2016-12-18 MED ORDER — SENNOSIDES-DOCUSATE SODIUM 8.6-50 MG PO TABS: 1.0000 | ORAL_TABLET | Freq: Every evening | ORAL | Status: DC | PRN

## 2016-12-18 MED ORDER — ASPIRIN 300 MG RE SUPP
300.0000 mg | Freq: Every day | RECTAL | Status: DC
  Filled 2016-12-18 (×3): qty 1

## 2016-12-18 MED ORDER — ONDANSETRON HCL 4 MG/2ML IJ SOLN: 4.0000 mg | Freq: Four times a day (QID) | INTRAMUSCULAR | Status: DC | PRN

## 2016-12-18 MED ORDER — IRBESARTAN 300 MG PO TABS: 150.0000 mg | ORAL_TABLET | Freq: Every day | ORAL | Status: DC

## 2016-12-18 MED ORDER — ACETAMINOPHEN 160 MG/5ML PO SOLN: 650.0000 mg | ORAL | Status: DC | PRN

## 2016-12-18 MED ORDER — ATORVASTATIN CALCIUM 80 MG PO TABS: 80.0000 mg | ORAL_TABLET | Freq: Every day | ORAL | 0 refills | Status: DC

## 2016-12-18 MED ORDER — AMLODIPINE BESYLATE 10 MG PO TABS
10.0000 mg | ORAL_TABLET | Freq: Every day | ORAL | Status: DC
  Administered 2016-12-19 – 2017-01-08 (×21): 10 mg via ORAL
  Filled 2016-12-18 (×21): qty 1

## 2016-12-18 MED ORDER — ENOXAPARIN SODIUM 40 MG/0.4ML ~~LOC~~ SOLN: 40.0000 mg | SUBCUTANEOUS | Status: DC

## 2016-12-18 MED ORDER — SORBITOL 70 % SOLN
30.0000 mL | Freq: Every day | Status: DC | PRN
  Administered 2016-12-20: 30 mL via ORAL
  Filled 2016-12-18: qty 30

## 2016-12-18 MED ORDER — ACETAMINOPHEN 325 MG PO TABS
650.0000 mg | ORAL_TABLET | ORAL | Status: DC | PRN
  Administered 2017-01-05: 650 mg via ORAL
  Filled 2016-12-18: qty 2

## 2016-12-18 MED ORDER — ENOXAPARIN SODIUM 40 MG/0.4ML ~~LOC~~ SOLN
40.0000 mg | SUBCUTANEOUS | Status: DC
  Administered 2016-12-19 – 2017-01-08 (×21): 40 mg via SUBCUTANEOUS
  Filled 2016-12-18 (×21): qty 0.4

## 2016-12-18 MED ORDER — CARVEDILOL 25 MG PO TABS
25.0000 mg | ORAL_TABLET | Freq: Two times a day (BID) | ORAL | Status: DC
  Administered 2016-12-18 – 2017-01-08 (×42): 25 mg via ORAL
  Filled 2016-12-18 (×43): qty 1

## 2016-12-18 MED ORDER — ONDANSETRON HCL 4 MG PO TABS: 4.0000 mg | ORAL_TABLET | Freq: Four times a day (QID) | ORAL | Status: DC | PRN

## 2016-12-18 MED ORDER — INSULIN ASPART 100 UNIT/ML ~~LOC~~ SOLN
0.0000 [IU] | Freq: Three times a day (TID) | SUBCUTANEOUS | Status: DC
  Administered 2016-12-18 – 2016-12-19 (×3): 1 [IU] via SUBCUTANEOUS
  Administered 2016-12-21 – 2016-12-25 (×3): 2 [IU] via SUBCUTANEOUS
  Administered 2016-12-25: 1 [IU] via SUBCUTANEOUS
  Administered 2016-12-26: 3 [IU] via SUBCUTANEOUS
  Administered 2016-12-28: 2 [IU] via SUBCUTANEOUS
  Administered 2016-12-28 – 2017-01-06 (×3): 1 [IU] via SUBCUTANEOUS

## 2016-12-18 MED ORDER — ATORVASTATIN CALCIUM 80 MG PO TABS
80.0000 mg | ORAL_TABLET | Freq: Every day | ORAL | Status: DC
  Administered 2016-12-18 – 2017-01-07 (×21): 80 mg via ORAL
  Filled 2016-12-18 (×22): qty 1

## 2016-12-18 MED ORDER — ASPIRIN 325 MG PO TABS: 325.0000 mg | ORAL_TABLET | Freq: Every day | ORAL | 0 refills | Status: DC

## 2016-12-18 MED ORDER — ACETAMINOPHEN 650 MG RE SUPP: 650.0000 mg | RECTAL | Status: DC | PRN

## 2016-12-18 MED ORDER — ASPIRIN 325 MG PO TABS
325.0000 mg | ORAL_TABLET | Freq: Every day | ORAL | Status: DC
  Administered 2016-12-19 – 2017-01-08 (×21): 325 mg via ORAL
  Filled 2016-12-18 (×21): qty 1

## 2016-12-18 MED ORDER — CARVEDILOL 25 MG PO TABS: 25.0000 mg | ORAL_TABLET | Freq: Two times a day (BID) | ORAL | 0 refills | Status: DC

## 2016-12-18 NOTE — Progress Notes (Signed)
To room 6 per bed. Pleasant and cooperative. Oriented to IP Rehab and information given. Denies distress upon arrival.

## 2016-12-18 NOTE — Progress Notes (Signed)
Report taken from Dereck Ligas RN from [facility] for room 8. To go to [facility].

## 2016-12-18 NOTE — PMR Pre-admission (Signed)
PMR Admission Coordinator Pre-Admission Assessment  Patient: [name] is an 51 y.o., male MRN: [id number] DOB: 17-May-1965 Height: _0  (160 cm) Weight: 99.8 kg (220 lb)              Insurance Information  PRIMARY: uninsured       Shanon Rosser financial counselor involved with disability and Clinical biochemist. [phone] I provided pt and wife her number to assist with obtaining information to assist with full Medicaid eligibility , not jut MAF.  Medicaid Application Date:       Case Manager:  Disability Application Date:       Case Worker:   Emergency Contact Information Contact Information    Name Relation Home Work [place] Spouse [phone]  [phone]     Current Medical History  Patient Admitting Diagnosis:  Right CVA  History of Present Illness:  HPI: [name] a 51 y.o.right handed malewith history of hypertension, diabetes mellitus and a chronic calcified brain mass likely meningioma. Presented 12/12/2016 with left-sided weakness. Noted blood pressure 240/140. Patient had reported he recently ran out of his clonidine and ARB a few months ago with financial constraints. Cranial CT scan reviewed, unremarkable for acute process. Per report, stable right medial frontal region calcified mass in vasogenic edema right frontal lobe. Stable bilateral anterior frontal lobe encephalomalacia. Patient did not receive TPA. CT angiogram head and neck negative for larger proximal arterial branch occlusion. MRI showed acute right paramedian brainstem infarct affecting lower pons and upper medulla. Heavy calcified mass probably intra-axial involves the medial right hemisphere slightly increased from 2016. Echocardiogram with ejection fraction of 58% grade 1 diastolic dysfunction no source of emboli. Maintained on aspirin for CVA prophylaxis. Subcutaneous Lovenox for DVT prophylaxis. Initially on Mechanical soft nectar thick liquid diet with follow-up  swallow study and diet advanced to a regular consistency with thin liquids.   Total: 2 NIHSS    Past Medical History  Past Medical History:  Diagnosis Date  . Colon cancer ([place])    rectal  . Diabetes mellitus without complication ([place])   . Hypertension   . Seizure ([place])     Family History  family history includes Colon cancer in his maternal grandmother; Diabetes in his maternal grandmother; Heart disease in his mother; Heart failure in his mother; Hyperlipidemia in his mother; Hypertension in his brother, father, mother, and sister; Stroke in his father.  Prior Rehab/Hospitalizations:  Has the patient had major surgery during 100 days prior to admission? No  Current Medications   Current Facility-Administered Medications:  .  acetaminophen (TYLENOL) tablet 650 mg, 650 mg, Oral, Q4H PRN **OR** acetaminophen (TYLENOL) solution 650 mg, 650 mg, Per Tube, Q4H PRN **OR** acetaminophen (TYLENOL) suppository 650 mg, 650 mg, Rectal, Q4H PRN, Etta Quill, DO .  amLODipine (NORVASC) tablet 10 mg, 10 mg, Oral, Daily, Alcario Drought, Jared M, DO, 10 mg at 12/18/16 1125 .  aspirin suppository 300 mg, 300 mg, Rectal, Daily **OR** aspirin tablet 325 mg, 325 mg, Oral, Daily, Jennette Kettle M, DO, 325 mg at 12/18/16 1125 .  atorvastatin (LIPITOR) tablet 80 mg, 80 mg, Oral, q1800, Rosalin Hawking, MD, 80 mg at 12/17/16 1806 .  carvedilol (COREG) tablet 25 mg, 25 mg, Oral, BID WC, Rizwan, Saima, MD, 25 mg at 12/18/16 1124 .  enoxaparin (LOVENOX) injection 40 mg, 40 mg, Subcutaneous, Q24H, Gardner, Jared M, DO, 40 mg at 12/18/16 1124 .  insulin aspart (novoLOG) injection 0-9 Units, 0-9 Units, Subcutaneous, TID WC, Rizwan, Saima,  MD, 1 Units at 12/18/16 1159 .  irbesartan (AVAPRO) tablet 150 mg, 150 mg, Oral, Daily, Jennette Kettle M, DO, 150 mg at 12/18/16 1126 .  labetalol (NORMODYNE,TRANDATE) injection 5-10 mg, 5-10 mg, Intravenous, Q2H PRN, Etta Quill, DO, 10 mg at 12/15/16 [id number] .  phenol  (CHLORASEPTIC) mouth spray 1 spray, 1 spray, Mouth/Throat, PRN, Velvet Bathe, MD, 1 spray at 12/18/16 0521 .  RESOURCE THICKENUP CLEAR, , Oral, PRN, Debbe Odea, MD .  senna-docusate (Senokot-S) tablet 1 tablet, 1 tablet, Oral, QHS PRN, Etta Quill, DO  Patients Current Diet: Diet regular Room service appropriate? 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 None of the above  Prior Functional Level Prior Function Level of Independence: Independent Comments: 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  Independent  Indoor Mobility: Did the patient need assistance with walking from room to room (with or without device)? Independent  Stairs: Did the patient need assistance with internal or external stairs (with or without device)? Independent  Functional Cognition: Did the patient need help planning regular tasks such as shopping or remembering to take medications? Independent  Current Functional Level Cognition  Arousal/Alertness: Awake/alert Overall Cognitive Status: Within Functional Limits for tasks assessed Orientation Level: Oriented X4 General Comments: Cognition intact for  basic mobility; will need further assessment. Laughs appropriately.  Attention: Selective Selective Attention: Appears intact Memory: Appears intact Awareness: Appears intact Problem Solving: Impaired Problem Solving Impairment: Verbal complex Executive Function: Reasoning Safety/Judgment: Appears intact    Extremity Assessment (includes Sensation/Coordination)  Upper Extremity Assessment: LUE deficits/detail LUE Deficits / Details: Pt demonstrates movement in [place] beginning stage IV; hand Br[place]strom stage IV LUE Sensation: decreased light touch, decreased proprioception LUE Coordination: decreased fine motor, decreased gross motor  Lower Extremity Assessment: LLE deficits/detail LLE Deficits / Details: patient demonstrates decreased gross strength 3/5 most motions LLE Sensation: decreased light touch LLE Coordination: decreased fine motor, decreased gross motor    ADLs  Overall ADL's : Needs assistance/impaired Eating/Feeding: NPO Grooming: Moderate assistance, Standing, Wash/dry face, Oral care Grooming Details (indicate cue type and reason): Assist for toothpaste and balance in standing. Cues for upright posture Upper Body Bathing: Maximal assistance, Sitting, Bed level Lower Body Bathing: Total assistance, Bed level Upper Body Dressing : Maximal assistance, Sitting Lower Body Dressing: Maximal assistance Lower Body Dressing Details (indicate cue type and reason): while seated EOB, pt needed total A to don left sock and Mod A to don right sock (could not get it started but could finish it) with RUE Toilet Transfer: Maximal assistance, +2 for physical assistance, Ambulation Toilet Transfer Details (indicate cue type and reason): Simulated by sit to stand from EOB with functional mobility in room. Toileting- Clothing Manipulation and Hygiene: Total assistance, Bed level Functional  mobility during ADLs: Maximal assistance, +2 for physical assistance(2 person HHA) General ADL  Comments: Pt limited to brief EOB activity due to elevated BP     Mobility  Overal bed mobility: Needs Assistance Bed Mobility: Rolling, Sidelying to Sit Rolling: Supervision Sidelying to sit: Min guard Sit to sidelying: Min assist General bed mobility comments: Able to initate LEs to EOB and utilize rail to pull to upright, light assist to bring trunk to midline    Transfers  Overall transfer level: Needs assistance Equipment used: 2 person hand held assist Transfers: Sit to/from Stand, Stand Pivot Transfers Sit to Stand: Min assist, Mod assist, +2 physical assistance Stand pivot transfers: Mod assist, +2 physical assistance General transfer comment: +2 Min assist to power up from bed; multi-modal cues  for trunk extension and facilitation of lateral wt shift to take pivotal steps to chair moving toward pt right side; pt hyperextends L knee to WB;     Ambulation / Gait / Stairs / Wheelchair Mobility  Ambulation/Gait Ambulation/Gait assistance: Max assist, +2 physical assistance Ambulation Distance (Feet): 8 Feet Assistive device: 2 person hand held assist Gait Pattern/deviations: Step-to pattern, Decreased stride length, Trunk flexed(wrap around support) General Gait Details: facilitation techniques required to initiate stride, Max multi modal cues for upright and weight shift bilaterally. Increased physical assist to block LLE and maintain posture and positioning during gait Gait velocity: decreased Gait velocity interpretation: <1.8 ft/sec, indicative of risk for recurrent falls    Posture / Balance Dynamic Sitting Balance Sitting balance - Comments: Able to initiate thoracic and lumbar extension for upright posture but only able to sustain for seconds and then fatigues.  Balance Overall balance assessment: Needs assistance Sitting-balance support: Feet supported, No upper extremity supported Sitting balance-Leahy Scale: Fair Sitting balance - Comments: Able to initiate thoracic  and lumbar extension for upright posture but only able to sustain for seconds and then fatigues.  Postural control: Left lateral lean Standing balance support: During functional activity Standing balance-Leahy Scale: Poor Standing balance comment: Able to stand statically with external support of 2. Cues for upright posture. Left knee hyperextended to support weight and prevent buckling. Weighshifting done with Mod A of 2. Fatigues easily    Special needs/care consideration BiPAP/CPAP  N/a CPM  N/a Continuous Drip IV  N/a Dialysis n/a Life Vest  N/a Oxygen  N/a Special Bed  N/a Trach Size  N/a Wound Vac n/a Skin  intact Bowel mgmt: LBM 12/17/2016 Bladder mgmt: condom catheter Diabetic mgmt  N/a   Previous Home Environment Living Arrangements: Spouse/significant other(and 105 year old daughter)  Lives With: Spouse, Daughter Available Help at Discharge: Family, Available PRN/intermittently(wife works 8 until 5 pm daily; daughter in school) Type of Home: House Home Layout: One level Home Access: Stairs to enter Entrance Stairs-Rails: None Technical brewer of Steps: 2 Bathroom Shower/Tub: Public librarian, Architectural technologist: Programmer, systems: Yes How Accessible: Accessible via walker Home Care Services: No Additional Comments: wife works Engineer, agricultural for Discharge Living Setting: Patient's home, Lives with (comment)(wife and 67 year old daughter) Type of Home at Discharge: House Discharge Home Layout: One level Discharge Home Access: Stairs to enter Entrance Stairs-Rails: None Entrance Stairs-Number of Steps: 2 Discharge Bathroom Shower/Tub: Tub/shower unit, Curtain Discharge Bathroom Toilet: Standard Discharge Bathroom Accessibility: Yes How Accessible: Accessible via walker Does the patient have any problems obtaining your medications?: Yes (Describe)(pt uninsured so ran out of meds over past few  months)  Social/Family/Support Systems Patient Roles: Spouse,  Parent Contact Information: [name], wife Anticipated Caregiver: wife, daughter Anticipated Caregiver's Contact Information: [phone] Ability/Limitations of Caregiver: wife works as Secretary/administrator 8 until 5 daily; able to make contact with her 47 until 1130 daily Caregiver Availability: Evenings only Discharge Plan Discussed with Primary Caregiver: Yes Is Caregiver In Agreement with Plan?: Yes Does Caregiver/Family have Issues with Lodging/Transportation while Pt is in Rehab?: No   Goals/Additional Needs Patient/Family Goal for Rehab: Mod I to supervision at Wheelchair level with PT, suerpvision to min assist with OT, Mod I to supervision with SLP Expected length of stay: ELOS 10- 14 days Special Service Needs: Plan for ELOS 2 weeks ; if not at a level to d/c directly home, Medical director of Case Management department will seek SNF placement with 30 day LOG. Additional Information: I discussed case with Georga Kaufmann and Dr. Reynaldo Minium of Case Management as well as Rehab Director, Larina Bras Pt/Family Agrees to Admission and willing to participate: Yes Program Orientation Provided & Reviewed with Pt/Caregiver Including Roles  & Responsibilities: Yes  Barriers to Discharge: Decreased caregiver support, Lack of/limited family support, Insurance for SNF coverage  Barriers to Discharge Comments: LOG after CIR if needed discussed with Care Management Georga Kaufmann, Dr. Reynaldo Minium and Rehab director, Larina Bras on 12/18/2016(Dr. Reynaldo Minium to be notified 7 days prior to planned d/c )  Decrease burden of Care through IP rehab admission: Decrease number of caregivers, Bowel and bladder program and Patient/family education  Possible need for SNF placement upon discharge: Have discussed with pt and wife estimated LOS of 2 weeks to give as much therapy upfront as possible. If pt not at a level to d/c home directly at that time at a wheelchair level  with wife working, Training and development officer will assist with SNF placement for just a couple of weeks. Goal would be to get patient at home at a level that they can manage as soon as realistic. Wife and patient to begin making arrangements for home and to assist with medicaid eligibility.   Patient Condition: This patient's medical and functional status has changed since the consult dated: 12/15/2016 in which the Rehabilitation Physician determined and documented that the patient's condition is appropriate for intensive rehabilitative care in an inpatient rehabilitation facility. See "History of Present Illness" (above) for medical update. Functional changes are: mod assist. Patient's medical and functional status update has been discussed with the Rehabilitation physician and patient remains appropriate for inpatient rehabilitation. Will admit to inpatient rehab today.  Preadmission Screen Completed By:  Cleatrice Burke, 12/18/2016 12:29 PM ______________________________________________________________________   Discussed status with Dr. Letta Pate on 12/18/2016 at  35 and received telephone approval for admission today.  Admission Coordinator:  Cleatrice Burke, time [id number] Date 12/18/2016

## 2016-12-18 NOTE — Progress Notes (Signed)
Physical Medicine and Rehabilitation Consult Reason for Consult: Left side weakness Referring Physician: [facility]  HPI: [name] is a 51 y.o. right handed male with history of hypertension, diabetes mellitus and a chronic brain mass likely meningioma. Per chart review and patient, patient lives with spouse. Independent prior to admission. Works at [employer] part time. One level home to steps to entry. Wife works during the day. Presented 12/12/2016 with left-sided weakness. Noted blood pressure 240/140. Patient had reported he recently ran out of his clonidine and ARB a few months ago. Cranial CT scan reviewed, unremarkable for acute process. Per report, stable right medial frontal region calcified mass in vasogenic edema right frontal lobe. Stable bilateral anterior frontal lobe encephalomalacia. Patient did not receive TPA. CT angiogram head and neck negative for larger proximal arterial branch occlusion. MRI showed acute right paramedian brainstem infarct affecting lower pons and upper medulla. Heavy calcified mass probably intra-axial involves the medial right hemisphere slightly increased from 2016. Echocardiogram with ejection fraction of 44% grade 1 diastolic dysfunction no source of emboli. Maintained on aspirin for CVA prophylaxis. Subcutaneous Lovenox for DVT prophylaxis. Mechanical soft nectar thick liquid diet. Physical and occupational therapy evaluations completed with recommendations of physical medicine rehabilitation consult.  Review of Systems  Constitutional: Negative for chills and fever.  HENT: Negative for hearing loss.   Eyes: Negative for blurred vision and double vision.  Respiratory: Negative for cough and shortness of breath.   Cardiovascular: Negative for chest pain, palpitations and leg swelling.  Gastrointestinal: Positive for constipation. Negative for nausea and vomiting.  Genitourinary: Negative for dysuria, flank pain and hematuria.  Skin: Negative  for rash.  Neurological: Positive for speech change and focal weakness.  All other systems reviewed and are negative.      Past Medical History:  Diagnosis Date  . Colon cancer ([place])    rectal  . Diabetes mellitus without complication ([place])   . Hypertension   . Seizure [facility])         Past Surgical History:  Procedure Laterality Date  . APPENDECTOMY    . JOINT REPLACEMENT Left 2004   Left hip  . TOTAL HIP ARTHROPLASTY          Family History  Problem Relation Age of Onset  . Hypertension Mother   . Heart disease Mother   . Hyperlipidemia Mother   . Heart failure Mother   . Hypertension Sister   . Hypertension Brother   . Stroke Father   . Hypertension Father   . Colon cancer Maternal Grandmother   . Diabetes Maternal Grandmother    Social History:  reports that  has never smoked. he has never used smokeless tobacco. He reports that he drinks alcohol. He reports that he does not use drugs. Allergies: No Known Allergies       Medications Prior to Admission  Medication Sig Dispense Refill  . amLODipine (NORVASC) 10 MG tablet Take 1 tablet (10 mg total) by mouth daily. 30 tablet 1  . ibuprofen (ADVIL,MOTRIN) 200 MG tablet Take 200-400 mg by mouth every 6 (six) hours as needed for fever, headache or mild pain.    Marland Kitchen JANUMET XR [phone] MG TB24 Take 1 tablet by mouth daily.    . valsartan (DIOVAN) 160 MG tablet Take 160 mg by mouth daily.      Home: Home Living Family/patient expects to be discharged to:: Other (Comment) Living Arrangements: Spouse/significant other Available Help at Discharge: Family Type of Home: House Home Access:  Stairs to enter [employer] of Steps: 2 Entrance Stairs-Rails: None Home Layout: One level Bathroom Shower/Tub: Chiropodist: Standard Home Equipment: Environmental consultant - 2 wheels, [employer] - single point, Bedside commode, Shower seat, Toilet riser, Grab bars - tub/shower Additional  Comments: Pt reports spouse and daughter work part time   Lives With: Spouse, Daughter  Functional History: Prior Function Level of Independence: Independent Comments: 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, mining Status:  Mobility: Bed Mobility Overal bed mobility: Needs Assistance Bed Mobility: Rolling, Sidelying to Sit, Sit to Sidelying Rolling: Supervision(To Left using bedrail ) Sidelying to sit: Mod assist Sit to sidelying: Min assist General bed mobility comments: assist to move LEs off bed, to lift trunk and to shift weight to Lt and to balance self  Transfers General transfer comment: unable to attempt due to elevated BP   ADL: ADL Overall ADL's : Needs assistance/impaired Eating/Feeding: NPO Grooming: Wash/dry hands, Wash/dry face, Sitting Upper Body Bathing: Maximal assistance, Sitting, Bed level Lower Body Bathing: Total assistance, Bed level Upper Body Dressing : Maximal assistance, Sitting Lower Body Dressing: Total assistance, Bed level Toilet Transfer: Total assistance Toilet Transfer Details (indicate cue type and reason): unable to accurately assess  Toileting- Clothing Manipulation and Hygiene: Total assistance, Bed level General ADL Comments: Pt limited to brief EOB activity due to elevated BP   Cognition: Cognition Overall Cognitive Status: Impaired/Different from baseline Arousal/Alertness: Awake/alert Orientation Level: Oriented X4 Attention: Selective Selective Attention: Appears intact Memory: Appears intact Awareness: Appears intact Problem Solving: Impaired Problem Solving Impairment: Verbal complex Executive Function: Reasoning Safety/Judgment: Appears intact Cognition Arousal/Alertness: Awake/alert Behavior During Therapy: Flat affect Overall Cognitive Status: Impaired/Different from baseline General Comments: Cognition intact for basic info.  Will benefit from further assessment    Blood pressure (!) 144/91, pulse 87, temperature 98.8 F (37.1 C), temperature source Oral, resp. rate 18, height 5\' 3"  (1.6 m), weight 99.8 kg (220 lb), SpO2 97 %. Physical Exam  Vitals reviewed. Constitutional: He is oriented to person, place, and time. He appears well-developed and well-nourished.  HENT:  Head: Normocephalic and atraumatic.  Eyes: EOM are normal. Right eye exhibits no discharge. Left eye exhibits no discharge.  Neck: Normal range of motion. Neck supple. No thyromegaly present.  Cardiovascular: Normal rate, regular rhythm and normal heart sounds.  Respiratory: Effort normal and breath sounds normal. No respiratory distress.  GI: Soft. Bowel sounds are normal. He exhibits no distension.  Musculoskeletal:  No edema or tenderness in extremities  Neurological: He is alert and oriented to person, place, and time.  Follow simple commands Motor: RUE/RLE: 5/5 proximal to distal LUE: 0/5 proximal to distal LLE: 1/5 proximal to distal Sensation diminished to light touch LUE/LLE  Skin: Skin is warm and dry.  Psychiatric: He has a normal mood and affect. His behavior is normal.    LabResultsLast24Hours  Results for orders placed or performed during the hospital encounter of 12/12/16 (from the past 24 hour(s))  Glucose, capillary     Status: Abnormal   Collection Time: 12/14/16  6:59 AM  Result Value Ref Range   Glucose-Capillary 128 (H) 65 - 99 mg/dL  Glucose, capillary     Status: Abnormal   Collection Time: 12/14/16 11:31 AM  Result Value Ref Range   Glucose-Capillary 128 (H) 65 - 99 mg/dL   Comment 1 Notify RN    Comment 2 Document in Chart   Glucose, capillary  Status: Abnormal   Collection Time: 12/14/16  4:27 PM  Result Value Ref Range   Glucose-Capillary 120 (H) 65 - 99 mg/dL   Comment 1 Notify RN    Comment 2 Document in Chart   Glucose, capillary     Status: Abnormal   Collection Time: 12/14/16  8:51 PM  Result Value Ref Range    Glucose-Capillary 134 (H) 65 - 99 mg/dL   Comment 1 Notify RN    Comment 2 Document in Chart       ImagingResults(Last48hours)  Mr Brain Wo Contrast  Result Date: 12/13/2016 CLINICAL DATA:  LEFT-sided weakness. This began early a.m. 12/12/2016. History of hypertension and diabetes. EXAM: MRI HEAD WITHOUT CONTRAST TECHNIQUE: Multiplanar, multiecho pulse sequences of the brain and surrounding structures were obtained without intravenous contrast. COMPARISON:  CT head and CTA head neck 12/12/2016. MRI brain without with contrast 09/04/2014. FINDINGS: Brain: There is a moderately large brainstem infarct, affecting the lower pons and upper medulla, RIGHT paramedian location, extending from the basis pontis to the floor of the fourth ventricle. No other areas of acute infarction are seen. No hemorrhage, hydrocephalus, or extra-axial fluid. Heavily calcified mass involving the medial RIGHT hemisphere, could be slightly increased from priors. Cross-section of 15 x 37 mm compares with 14 x 34 mm in 2016. A small component of the mass does extend under the falx to the LEFT, as noted previously. Surrounding vasogenic edema is present primarily in the RIGHT hemisphere. No contrast was administered. Concern for calcific glial neoplasm is raised, as the mass does appear to be intra-axial. Marked corpus callosum all atrophy, uncertain etiology. Bifrontal white matter gliosis, in this patient with a history of trauma. Other areas of subcortical and periventricular white matter signal abnormality, probable small vessel disease. Vascular: Normal flow voids. Skull and upper cervical spine: Normal marrow signal. Sinuses/Orbits: Negative. Other: None. IMPRESSION: Acute RIGHT paramedian brainstem infarct affecting lower pons and upper medulla, is responsible for the reported symptoms. This appears to represent a small vessel insult, as the adjacent major vascular structures appear widely patent. Heavily calcified  mass, probably intra-axial, involves the medial RIGHT hemisphere, slightly increased from 2016. Further assessment could be performed with post infusion imaging as clinically indicated. Atrophy and small vessel disease. Electronically Signed   By: Staci Righter M.D.   On: 12/13/2016 07:30     Assessment/Plan: Diagnosis: Right paramedian brainstem infarct  Labs and images independently reviewed.  Records reviewed and summated above. Stroke: Continue secondary stroke prophylaxis and Risk Factor Modification listed below:   Antiplatelet therapy:   Blood Pressure Management:  Continue current medication with prn's with permisive HTN per primary team Statin Agent:   Prediabetes management:   Left sided hemiparesis: fit for orthosis to prevent contractures (resting hand splint for day, wrist cock up splint at night, PRAFO, etc) Motor recovery: Fluoxetine  1. Does the need for close, 24 hr/day medical supervision in concert with the patient's rehab needs make it unreasonable for this patient to be served in a less intensive setting? Yes  2. Co-Morbidities requiring supervision/potential complications: HTN (monitor and provide prns in accordance with increased physical exertion and pain), diabetes mellitus (Monitor in accordance with exercise and adjust meds as necessary), chronic brain mass (recs per neurology), diastolic dysfunction (monitor for signs/symptoms of fluid overload), post-stroke dysphagia (advance diet as tolerated), hypokalemia (continue to monitor and replete as necessary), AKI (avoid nephrotoxic meds), prediabetes (Monitor in accordance with exercise and adjust meds as necessary) 3. Due to safety, disease  management, pain management and patient education, does the patient require 24 hr/day rehab nursing? Yes 4. Does the patient require coordinated care of a physician, rehab nurse, PT (1-2 hrs/day, 5 days/week), OT (1-2 hrs/day, 5 days/week) and SLP (1-2 hrs/day, 5 days/week) to address  physical and functional deficits in the context of the above medical diagnosis(es)? Yes Addressing deficits in the following areas: balance, endurance, locomotion, strength, transferring, bathing, dressing, toileting, speech, swallowing and psychosocial support 5. Can the patient actively participate in an intensive therapy program of at least 3 hrs of therapy per day at least 5 days per week? Yes 6. The potential for patient to make measurable gains while on inpatient rehab is excellent 7. Anticipated functional outcomes upon discharge from inpatient rehab are min assist  with PT, min assist with OT, modified independent with SLP. 8. Estimated rehab length of stay to reach the above functional goals is: 18-22 days. 9. Anticipated D/C setting: Other 10. Anticipated post D/C treatments: SNF 11. Overall Rehab/Functional Prognosis: good  RECOMMENDATIONS: This patient's condition is appropriate for continued rehabilitative care in the following setting: CIR to decrease burden of care prior once final recs from Neurology and BP stable, hopefully tomorrow. Patient has agreed to participate in recommended program. Yes Note that insurance prior authorization may be required for reimbursement for recommended care.  Comment: Rehab Admissions Coordinator to follow up.  Delice Lesch, MD, ABPMR Lavon Paganini Angiulli, PA-C 12/15/2016

## 2016-12-18 NOTE — Care Management Note (Signed)
Case Management Note  Patient Details  Name: [name] MRN: [id number] Date of Birth: 1965-07-12  Subjective/Objective:                    Action/Plan: Pt discharging to CIR today. No further needs per CM.  Expected Discharge Date:                  Expected Discharge Plan:  IP Rehab Facility  In-House Referral:  Clinical Social Work  Discharge planning Services  CM Consult  Post Acute Care Choice:    Choice offered to:     DME Arranged:    DME Agency:     HH Arranged:    [place] Agency:     Status of Service:  Completed, signed off  If discussed at [employer], dates discussed:    Additional Comments:  Pollie Friar, RN 12/18/2016, 11:33 AM

## 2016-12-18 NOTE — Discharge Summary (Signed)
Physician Discharge Summary  ABDULLOH [name] [name]:[id number] DOB: [birth year] DOA: 12/12/2016  PCP: Glendale Chard, MD  Admit date: 12/12/2016 Discharge date: 12/18/2016  Time spent: > 35 minutes  Recommendations for Outpatient Follow-up:  1. Monitor blood pressures 2. Ensure f/u with neurologist after hospital discharge.   Discharge Diagnoses:  Principal Problem:   Acute ischemic stroke [facility]) Active Problems:   Hypertension   DM2 (diabetes mellitus, type 2) (HCC)   Brain mass   Diastolic dysfunction   Dysphagia, post-stroke   AKI (acute kidney injury) ([place])   Prediabetes   Discharge Condition: stable  Diet recommendation: regular diet  Filed Weights   12/12/16 2128  Weight: 99.8 kg (220 lb)    History of present illness:  50 y.o. male with history of hypertension, seizure disorder, diabetes mellitus, and history of colon cancer presenting with left-sided weakness and diplopia. He did not receive IV t-PA due to late presentation.  Stroke:  Brainstem infarct secondary to small vessel disease.  Hospital Course:  Per Neurology:  Stroke:  Brainstem infarct secondary to small vessel disease.  Resultant diplopia and left hemiparesis  CT head - Stable right medial frontal region calcified mass and vasogenic edema in right frontal lobe.  MRI head - Acute RIGHT paramedian brainstem infarct affecting lower pons and upper medulla  MRA head - not performed  CTA H&N - Moderate intracranial atherosclerotic disease (see details above)  Carotid Doppler - CTA neck 2D Echo -Left ventricle: The cavity size was normal. Wall thickness was increased in a pattern of mild LVH. There was severe focal basal hypertrophy of the septum. Systolic function was normal. The estimated ejection fraction was in the range of 60% to 65%. Wall motion was normal; there were no regional wall motion  abnormalities  LDL - 154  HgbA1c - 6.2  VTE prophylaxis - Lovenox  DIET DYS 3  Room service appropriate? Yes; Fluid consistency: Nectar Thick  No antithrombotic prior to admission, now on aspirin 325 mg daily    Patient counseled to be compliant with his antithrombotic medications  Ongoing aggressive stroke risk factor management  Therapy recommendations: CIR recommended  Disposition:  Pending  Hypertension  Stable - but high              Permissive hypertension (OK if < 220/120) but gradually normalize in 5-7 days              Long-term BP goal normotensive  Hyperlipidemia  Home meds:  No lipid lowering medications prior to admission  LDL 154, goal < 70  Now on Lipitor 80 mg daily  Continue statin at discharge  Diabetes  HgbA1c 6.2, goal < 7.0  Controlled  Other Stroke Risk Factors  ETOH use, advised to drink no more than 1 drink per day.  Obesity, Body mass index is 38.97 kg/m., recommend weight loss, diet and exercise as appropriate   Family hx stroke (father)  History of colon cancer  Other Active Problems  Hypokalemia - 3.4 -> supplement   Plan / Recommendations  Aspirin 325 mg daily   Moderate intracranial atherosclerotic disease (see details above)  Possible CIR admission  Lipitor 80 mg daily  Follow-up with Dr. Leonie Man after discharge   Consultations:  Neurology  Discharge Exam: Vitals:   12/18/16 1035 12/18/16 1416  BP: (!) 153/97 127/75  Pulse: 94 93  Resp: 17 18  Temp: 99.4 F (37.4 C) 99 F (37.2 C)  SpO2: 100% 97%    General: Pt in nad, alert and  awake Cardiovascular: rrr, no rubs Respiratory: no increased wob, no wheezes  Discharge Instructions   Discharge Instructions    Call MD for:  severe uncontrolled pain   Complete by:  As directed    Call MD for:  temperature >100.4   Complete by:  As directed    Diet - low sodium heart healthy   Complete by:  As directed    Discharge instructions   Complete by:  As directed    Please ensure patient follows up with neurologist within 2  weeks after hospital discharge.   Increase activity slowly   Complete by:  As directed      Allergies as of 12/18/2016   No Known Allergies     Medication List    STOP taking these medications   ibuprofen 200 MG tablet Commonly known as:  ADVIL,MOTRIN     TAKE these medications   amLODipine 10 MG tablet Commonly known as:  NORVASC Take 1 tablet (10 mg total) by mouth daily.   aspirin 325 MG tablet Take 1 tablet (325 mg total) by mouth daily. Start taking on:  12/19/2016   atorvastatin 80 MG tablet Commonly known as:  LIPITOR Take 1 tablet (80 mg total) by mouth daily at 6 PM.   carvedilol 25 MG tablet Commonly known as:  COREG Take 1 tablet (25 mg total) by mouth 2 (two) times daily with a meal.   JANUMET XR [phone] MG Tb24 Generic drug:  SitaGLIPtin-MetFORMIN HCl Take 1 tablet by mouth daily.   valsartan 160 MG tablet Commonly known as:  DIOVAN Take 160 mg by mouth daily.      No Known Allergies    The results of significant diagnostics from this hospitalization (including imaging, microbiology, ancillary and laboratory) are listed below for reference.    Significant Diagnostic Studies: Ct Angio Head W Or Wo Contrast  Result Date: 12/13/2016 CLINICAL DATA:  Initial evaluation for acute left-sided weakness. EXAM: CT ANGIOGRAPHY HEAD AND NECK TECHNIQUE: Multidetector CT imaging of the head and neck was performed using the standard protocol during bolus administration of intravenous contrast. Multiplanar CT image reconstructions and MIPs were obtained to evaluate the vascular anatomy. Carotid stenosis measurements (when applicable) are obtained utilizing NASCET criteria, using the distal internal carotid diameter as the denominator. CONTRAST:  80mL ISOVUE-370 IOPAMIDOL (ISOVUE-370) INJECTION 76% COMPARISON:  Prior CT from earlier the same day. FINDINGS: CTA NECK FINDINGS Aortic arch: Visualized aortic arch of normal caliber with normal branch pattern. No flow-limiting  stenosis about the origin of the great vessels. Visualized subclavian artery is widely patent. Right carotid system: Right common and internal carotid arteries are widely patent without stenosis, dissection, or occlusion. Minimal atheromatous plaque about the right carotid bifurcation without significant stenosis. Left carotid system: Left common and internal carotid arteries are widely patent without stenosis, dissection, or occlusion. Atheromatous plaque about the left carotid bifurcation with relative mild narrowing of no more than 25% by NASCET criteria. Vertebral arteries: Both of the vertebral arteries arise from the subclavian arteries. Right vertebral artery dominant. Mild-to-moderate stenosis at the origin of the left vertebral artery. Vertebral arteries otherwise patent within the neck without stenosis or occlusion. Skeleton: No acute osseous abnormality. No worrisome lytic or blastic osseous lesions. Other neck: No acute soft tissue abnormality within the neck. Thyroid normal. Salivary glands within normal limits. No adenopathy. Upper chest: Visualized upper chest within normal limits. Partially visualized lungs are clear. Review of the MIP images confirms the above findings CTA HEAD FINDINGS Anterior  circulation: Petrous segments patent bilaterally without stenosis. Scattered calcified atheromatous plaque within the cavernous/ supraclinoid left ICA and with mild to moderate multifocal narrowing. Focal calcified plaque within the supraclinoid right ICA with moderate stenosis. ICA termini patent. Right A1 segment patent. Absent/hypoplastic left A1 segment, which likely accounts for the diminutive left ICA is compared to the right. Patent anterior communicating artery. Multifocal atheromatous irregularity within the anterior cerebral arteries bilaterally with moderate to severe multifocal stenoses. M1 segments demonstrate mild smooth narrowing at their distal aspects bilaterally. MCA bifurcations within  normal limits. No proximal M2 occlusion. MCA branches well perfused and symmetric. Small vessel atheromatous irregularity throughout the MCA branches bilaterally. Posterior circulation: Focal plaque within the dominant right V4 segment with mild short-segment stenosis. Right vertebral artery otherwise widely patent. Multifocal atheromatous irregularity within the diminutive left V4 segment with mild to moderate multifocal narrowing. Posterior inferior cerebral arteries patent bilaterally. Basilar artery in patent to its distal aspect without flow-limiting stenosis. Superior cerebellar is patent bilaterally. Both of the posterior cerebral artery supplied via the basilar and are patent to their distal aspects without flow-limiting stenosis. Small vessel atheromatous irregularity within the distal PCAs bilaterally. Venous sinuses: Not well evaluated due to arterial timing of the contrast bolus. Anatomic variants: Hypoplastic/ absent left A1 segment with the anterior cerebral artery supplied via the right carotid artery system. No aneurysm or vascular malformation. Delayed phase: Calcified parafalcine mass with associated right frontal vasogenic edema again noted. Bifrontal encephalomalacia noted as well. No other pathologic enhancement. Review of the MIP images confirms the above findings IMPRESSION: 1. Negative CTA for large or proximal arterial branch occlusion. 2. Moderate intracranial atherosclerotic disease, most evident involving the anterior cerebral arteries bilaterally were there are moderate to severe multifocal stenoses. Moderate carotid siphon atherosclerosis with resultant mild to moderate multifocal narrowing, with mild to moderate bilateral V4 atheromatous changes, left worse than right. 3. Mild carotid bifurcation atherosclerosis without flow-limiting stenosis. Electronically Signed   By: Jeannine Boga M.D.   On: 12/13/2016 00:10   Ct Head Wo Contrast  Result Date: 12/12/2016 CLINICAL DATA:   51 y/o  M; frontal headaches, weakness in legs. EXAM: CT HEAD WITHOUT CONTRAST TECHNIQUE: Contiguous axial images were obtained from the base of the skull through the vertex without intravenous contrast. COMPARISON:  07/21/2014 CT head.  09/04/2014 MRI head. FINDINGS: Brain: Calcified right medial frontal region mass measuring 4.3 x 2.1 x 2.3 cm (AP x ML x CC), stable from prior CT given differences in slice selection. Hypoattenuation within the adjacent right frontal lobe likely represents associated vasogenic edema and is without significant interval change from prior CT of head. Stable bilateral anterior frontal lobe encephalomalacia. No evidence for new large acute stroke, hemorrhage, or focal mass effect. No hydrocephalus or extra-axial collection Vascular: Dense calcification of paraclinoid internal carotid arteries. Skull: Normal. Negative for fracture or focal lesion. Sinuses/Orbits: Mild ethmoid sinus mucosal thickening. Normal aeration of mastoid air cells. Right external auditory canal opacification, likely cerumen. Orbits are unremarkable. Other: None. IMPRESSION: 1. No acute intracranial abnormality identified. 2. Stable right medial frontal region calcified mass and vasogenic edema in right frontal lobe. 3. Stable bilateral anterior frontal lobe encephalomalacia. 4. Mild ethmoid sinus disease. Electronically Signed   By: Kristine Garbe M.D.   On: 12/12/2016 22:43   Ct Angio Neck W Or Wo Contrast  Result Date: 12/13/2016 CLINICAL DATA:  Initial evaluation for acute left-sided weakness. EXAM: CT ANGIOGRAPHY HEAD AND NECK TECHNIQUE: Multidetector CT imaging of the head and neck  was performed using the standard protocol during bolus administration of intravenous contrast. Multiplanar CT image reconstructions and MIPs were obtained to evaluate the vascular anatomy. Carotid stenosis measurements (when applicable) are obtained utilizing NASCET criteria, using the distal internal carotid diameter  as the denominator. CONTRAST:  69mL ISOVUE-370 IOPAMIDOL (ISOVUE-370) INJECTION 76% COMPARISON:  Prior CT from earlier the same day. FINDINGS: CTA NECK FINDINGS Aortic arch: Visualized aortic arch of normal caliber with normal branch pattern. No flow-limiting stenosis about the origin of the great vessels. Visualized subclavian artery is widely patent. Right carotid system: Right common and internal carotid arteries are widely patent without stenosis, dissection, or occlusion. Minimal atheromatous plaque about the right carotid bifurcation without significant stenosis. Left carotid system: Left common and internal carotid arteries are widely patent without stenosis, dissection, or occlusion. Atheromatous plaque about the left carotid bifurcation with relative mild narrowing of no more than 25% by NASCET criteria. Vertebral arteries: Both of the vertebral arteries arise from the subclavian arteries. Right vertebral artery dominant. Mild-to-moderate stenosis at the origin of the left vertebral artery. Vertebral arteries otherwise patent within the neck without stenosis or occlusion. Skeleton: No acute osseous abnormality. No worrisome lytic or blastic osseous lesions. Other neck: No acute soft tissue abnormality within the neck. Thyroid normal. Salivary glands within normal limits. No adenopathy. Upper chest: Visualized upper chest within normal limits. Partially visualized lungs are clear. Review of the MIP images confirms the above findings CTA HEAD FINDINGS Anterior circulation: Petrous segments patent bilaterally without stenosis. Scattered calcified atheromatous plaque within the cavernous/ supraclinoid left ICA and with mild to moderate multifocal narrowing. Focal calcified plaque within the supraclinoid right ICA with moderate stenosis. ICA termini patent. Right A1 segment patent. Absent/hypoplastic left A1 segment, which likely accounts for the diminutive left ICA is compared to the right. Patent anterior  communicating artery. Multifocal atheromatous irregularity within the anterior cerebral arteries bilaterally with moderate to severe multifocal stenoses. M1 segments demonstrate mild smooth narrowing at their distal aspects bilaterally. MCA bifurcations within normal limits. No proximal M2 occlusion. MCA branches well perfused and symmetric. Small vessel atheromatous irregularity throughout the MCA branches bilaterally. Posterior circulation: Focal plaque within the dominant right V4 segment with mild short-segment stenosis. Right vertebral artery otherwise widely patent. Multifocal atheromatous irregularity within the diminutive left V4 segment with mild to moderate multifocal narrowing. Posterior inferior cerebral arteries patent bilaterally. Basilar artery in patent to its distal aspect without flow-limiting stenosis. Superior cerebellar is patent bilaterally. Both of the posterior cerebral artery supplied via the basilar and are patent to their distal aspects without flow-limiting stenosis. Small vessel atheromatous irregularity within the distal PCAs bilaterally. Venous sinuses: Not well evaluated due to arterial timing of the contrast bolus. Anatomic variants: Hypoplastic/ absent left A1 segment with the anterior cerebral artery supplied via the right carotid artery system. No aneurysm or vascular malformation. Delayed phase: Calcified parafalcine mass with associated right frontal vasogenic edema again noted. Bifrontal encephalomalacia noted as well. No other pathologic enhancement. Review of the MIP images confirms the above findings IMPRESSION: 1. Negative CTA for large or proximal arterial branch occlusion. 2. Moderate intracranial atherosclerotic disease, most evident involving the anterior cerebral arteries bilaterally were there are moderate to severe multifocal stenoses. Moderate carotid siphon atherosclerosis with resultant mild to moderate multifocal narrowing, with mild to moderate bilateral V4  atheromatous changes, left worse than right. 3. Mild carotid bifurcation atherosclerosis without flow-limiting stenosis. Electronically Signed   By: Jeannine Boga M.D.   On: 12/13/2016 00:10  Mr Brain Wo Contrast  Result Date: 12/13/2016 CLINICAL DATA:  LEFT-sided weakness. This began early a.m. 12/12/2016. History of hypertension and diabetes. EXAM: MRI HEAD WITHOUT CONTRAST TECHNIQUE: Multiplanar, multiecho pulse sequences of the brain and surrounding structures were obtained without intravenous contrast. COMPARISON:  CT head and CTA head neck 12/12/2016. MRI brain without with contrast 09/04/2014. FINDINGS: Brain: There is a moderately large brainstem infarct, affecting the lower pons and upper medulla, RIGHT paramedian location, extending from the basis pontis to the floor of the fourth ventricle. No other areas of acute infarction are seen. No hemorrhage, hydrocephalus, or extra-axial fluid. Heavily calcified mass involving the medial RIGHT hemisphere, could be slightly increased from priors. Cross-section of 15 x 37 mm compares with 14 x 34 mm in 2016. A small component of the mass does extend under the falx to the LEFT, as noted previously. Surrounding vasogenic edema is present primarily in the RIGHT hemisphere. No contrast was administered. Concern for calcific glial neoplasm is raised, as the mass does appear to be intra-axial. Marked corpus callosum all atrophy, uncertain etiology. Bifrontal white matter gliosis, in this patient with a history of trauma. Other areas of subcortical and periventricular white matter signal abnormality, probable small vessel disease. Vascular: Normal flow voids. Skull and upper cervical spine: Normal marrow signal. Sinuses/Orbits: Negative. Other: None. IMPRESSION: Acute RIGHT paramedian brainstem infarct affecting lower pons and upper medulla, is responsible for the reported symptoms. This appears to represent a small vessel insult, as the adjacent major vascular  structures appear widely patent. Heavily calcified mass, probably intra-axial, involves the medial RIGHT hemisphere, slightly increased from 2016. Further assessment could be performed with post infusion imaging as clinically indicated. Atrophy and small vessel disease. Electronically Signed   By: Staci Righter M.D.   On: 12/13/2016 07:30   Dg Chest Port 1 View  Result Date: 12/12/2016 CLINICAL DATA:  51 y/o M; sudden weakness in the left leg and left arm. Cough. EXAM: PORTABLE CHEST 1 VIEW COMPARISON:  07/25/2014 chest radiograph FINDINGS: Stable cardiomegaly. Clear lungs. No pleural effusion or pneumothorax. No acute osseous abnormality is evident. IMPRESSION: Stable cardiomegaly.  No acute pulmonary process identified. Electronically Signed   By: Kristine Garbe M.D.   On: 12/12/2016 22:50   Dg Swallowing Func-speech Pathology  Result Date: 12/16/2016 Objective Swallowing Evaluation: Type of Study: MBS-Modified Barium Swallow Study  Patient Details Name: [name] MRN: [id number] Date of Birth: 28-Nov-1965 Today's Date: 12/16/2016 Time: SLP Start Time (ACUTE ONLY): 0930 -SLP Stop Time (ACUTE ONLY): 1000 SLP Time Calculation (min) (ACUTE ONLY): 30 min Past Medical History: Past Medical History: Diagnosis Date . Colon cancer ([place])   rectal . Diabetes mellitus without complication ([place])  . Hypertension  . Seizure [facility])  Past Surgical History: Past Surgical History: Procedure Laterality Date . APPENDECTOMY   . JOINT REPLACEMENT Left 2004  Left hip . TOTAL HIP ARTHROPLASTY   HPI: Nazario Russom Whitakeris a 51 y.o.malewith medical history significant ofDM, HTN, and a chronic brain mass that's likely a meningioma. Patient presents to the ED with c/o L sided weakness. LKW when he went to bed last night. Woke up at 1AM with weakness. Went back to sleep. Still present when he woke up again in AM. MRI 12/13/16 showed Acute RIGHT paramedian brainstem infarct affecting lower pons and upper  medulla.Heavily calcified mass, probably intra-axial, involves the medial RIGHT hemisphere, slightly increased from 2016.  Subjective: alert Assessment / Plan / Recommendation CHL IP CLINICAL IMPRESSIONS 12/16/2016 Clinical Impression Pt demonstrates  functional oropharyngeal swallow with proficient/effective mastication, normal timing of pharyngeal swallow, high penetration with thin liquids, but rarely (penetration/aspiration scale 2), no aspiration despite large, consecutive boluses.  Pt consumed 13 mm barium pill without deficit.  Recommend advancing diet to regular, thin liquids.  Meds whole with water.  SLP Visit Diagnosis Dysphagia, oropharyngeal phase (R13.12) Attention and concentration deficit following -- Frontal lobe and executive function deficit following -- Impact on safety and function Mild aspiration risk   CHL IP TREATMENT RECOMMENDATION 12/16/2016 Treatment Recommendations No treatment recommended at this time   Prognosis 12/13/2016 Prognosis for Safe Diet Advancement Good Barriers to Reach Goals -- Barriers/Prognosis Comment -- CHL IP DIET RECOMMENDATION 12/16/2016 SLP Diet Recommendations Regular solids;Thin liquid Liquid Administration via Straw;Cup Medication Administration Whole meds with liquid Compensations -- Postural Changes --   CHL IP OTHER RECOMMENDATIONS 12/16/2016 Recommended Consults -- Oral Care Recommendations Oral care BID Other Recommendations --   CHL IP FOLLOW UP RECOMMENDATIONS 12/15/2016 Follow up Recommendations Inpatient Rehab   CHL IP FREQUENCY AND DURATION 12/14/2016 Speech Therapy Frequency (ACUTE ONLY) min 2x/week Treatment Duration --      CHL IP ORAL PHASE 12/16/2016 Oral Phase WFL Oral - Pudding Teaspoon -- Oral - Pudding Cup -- Oral - Honey Teaspoon -- Oral - Honey Cup -- Oral - Nectar Teaspoon -- Oral - Nectar Cup -- Oral - Nectar Straw -- Oral - Thin Teaspoon -- Oral - Thin Cup -- Oral - Thin Straw -- Oral - Puree -- Oral - Mech Soft -- Oral - Regular -- Oral -  Multi-Consistency -- Oral - Pill -- Oral Phase - Comment --  CHL IP PHARYNGEAL PHASE 12/16/2016 Pharyngeal Phase WFL Pharyngeal- Pudding Teaspoon -- Pharyngeal -- Pharyngeal- Pudding Cup -- Pharyngeal -- Pharyngeal- Honey Teaspoon -- Pharyngeal -- Pharyngeal- Honey Cup -- Pharyngeal -- Pharyngeal- Nectar Teaspoon -- Pharyngeal -- Pharyngeal- Nectar Cup -- Pharyngeal -- Pharyngeal- Nectar Straw -- Pharyngeal -- Pharyngeal- Thin Teaspoon -- Pharyngeal -- Pharyngeal- Thin Cup -- Pharyngeal -- Pharyngeal- Thin Straw -- Pharyngeal -- Pharyngeal- Puree -- Pharyngeal -- Pharyngeal- Mechanical Soft -- Pharyngeal -- Pharyngeal- Regular -- Pharyngeal -- Pharyngeal- Multi-consistency -- Pharyngeal -- Pharyngeal- Pill -- Pharyngeal -- Pharyngeal Comment --  CHL IP CERVICAL ESOPHAGEAL PHASE 12/16/2016 Cervical Esophageal Phase WFL Pudding Teaspoon -- Pudding Cup -- Honey Teaspoon -- Honey Cup -- Nectar Teaspoon -- Nectar Cup -- Nectar Straw -- Thin Teaspoon -- Thin Cup -- Thin Straw -- Puree -- Mechanical Soft -- Regular -- Multi-consistency -- Pill -- Cervical Esophageal Comment -- No flowsheet data found. Juan Quam Laurice 12/16/2016, 10:08 AM               Microbiology: No results found for this or any previous visit (from the past 240 hour(s)).   Labs: Basic Metabolic Panel: Recent Labs  Lab 12/12/16 2148  NA 140  K 3.4*  CL 106  CO2 22  GLUCOSE 132*  BUN 12  CREATININE 1.21  CALCIUM 9.8   Liver Function Tests: No results for input(s): AST, ALT, ALKPHOS, BILITOT, PROT, ALBUMIN in the last 168 hours. No results for input(s): LIPASE, AMYLASE in the last 168 hours. No results for input(s): AMMONIA in the last 168 hours. CBC: Recent Labs  Lab 12/12/16 2148  WBC 6.5  HGB 13.3  HCT 39.4  MCV 74.6*  PLT 343   Cardiac Enzymes: No results for input(s): CKTOTAL, CKMB, CKMBINDEX, TROPONINI in the last 168 hours. BNP: BNP (last 3 results) Recent Labs    12/12/16 2148  BNP 59.9    ProBNP  (last 3 results) No results for input(s): PROBNP in the last 8760 hours.  CBG: Recent Labs  Lab 12/17/16 1119 12/17/16 1619 12/17/16 2058 12/18/16 0620 12/18/16 1146  GLUCAP 107* 122* 159* 135* 133*    Signed:  Velvet Bathe MD.  [facility] 12/18/2016, 3:22 PM

## 2016-12-18 NOTE — Progress Notes (Signed)
Cristina Gong, RN  Rehab Admission Coordinator  Physical Medicine and Rehabilitation  PMR Pre-admission  Signed  Date of Service:  12/18/2016 12:29 PM       Related encounter: ED to Hosp-Admission (Current) from 12/12/2016 in [facility]      Signed            []Hide copied text  []Hover for details   PMR Admission Coordinator Pre-Admission Assessment  Patient: [name] is an 51 y.o., male MRN: [id number] DOB: [birth year] Height: 5' 3" (160 cm) Weight: 99.8 kg (220 lb)                                                                                                                                                  Insurance Information  PRIMARY: uninsured       Shanon Rosser financial counselor involved with disability and Clinical biochemist. [phone] I provided pt and wife her number to assist with obtaining information to assist with full Medicaid eligibility , not jut MAF.  Medicaid Application Date:       Case Manager:  Disability Application Date:       Case Worker:   Emergency Contact Information        Contact Information    Name Relation Home Work Mobile   [place] Spouse [phone]  [phone]     Current Medical History  Patient Admitting Diagnosis:  Right CVA  History of Present Illness:  PTW:SFKCLE D Whitakeris a 51 y.o.right handed malewith history of hypertension, diabetes mellitus and a chroniccalcifiedbrain mass likely meningioma. Presented 12/12/2016 with left-sided weakness. Noted blood pressure 240/140. Patient had reported he recently ran out of his clonidine and ARB a few months agowith financial constraints. Cranial CT scan reviewed, unremarkable for acute process. Per report, stable right medial frontal region calcified mass in vasogenic edema right frontal lobe. Stable bilateral anterior frontal lobe encephalomalacia. Patient did not receive TPA. CT angiogram head and neck  negative for larger proximal arterial branch occlusion. MRI showed acute right paramedian brainstem infarct affecting lower pons and upper medulla. Heavy calcified mass probably intra-axial involves the medial right hemisphere slightly increased from 2016. Echocardiogram with ejection fraction of 75% grade 1 diastolic dysfunction no source of emboli. Maintained on aspirin for CVA prophylaxis. Subcutaneous Lovenox for DVT prophylaxis.Initially onMechanical soft nectar thick liquid dietwith follow-up swallow study and diet advanced to a regular consistency with thin liquids.   Total: 2 NIHSS  Past Medical History      Past Medical History:  Diagnosis Date  . Colon cancer ([place])    rectal  . Diabetes mellitus without complication ([place])   . Hypertension   . Seizure ([place])     Family History  family history includes Colon cancer in his maternal grandmother; Diabetes in his maternal grandmother; Heart disease in his mother; Heart failure  in his mother; Hyperlipidemia in his mother; Hypertension in his brother, father, mother, and sister; Stroke in his father.  Prior Rehab/Hospitalizations:  Has the patient had major surgery during 100 days prior to admission? No  Current Medications   Current Facility-Administered Medications:  .  acetaminophen (TYLENOL) tablet 650 mg, 650 mg, Oral, Q4H PRN **OR** acetaminophen (TYLENOL) solution 650 mg, 650 mg, Per Tube, Q4H PRN **OR** acetaminophen (TYLENOL) suppository 650 mg, 650 mg, Rectal, Q4H PRN, Gardner, Jared M, DO .  amLODipine (NORVASC) tablet 10 mg, 10 mg, Oral, Daily, Gardner, Jared M, DO, 10 mg at 12/18/16 1125 .  aspirin suppository 300 mg, 300 mg, Rectal, Daily **OR** aspirin tablet 325 mg, 325 mg, Oral, Daily, Gardner, Jared M, DO, 325 mg at 12/18/16 1125 .  atorvastatin (LIPITOR) tablet 80 mg, 80 mg, Oral, q1800, Xu, Jindong, MD, 80 mg at 12/17/16 1806 .  carvedilol (COREG) tablet 25 mg, 25 mg, Oral, BID WC, Rizwan, Saima, MD, 25  mg at 12/18/16 1124 .  enoxaparin (LOVENOX) injection 40 mg, 40 mg, Subcutaneous, Q24H, Gardner, Jared M, DO, 40 mg at 12/18/16 1124 .  insulin aspart (novoLOG) injection 0-9 Units, 0-9 Units, Subcutaneous, TID WC, Rizwan, Saima, MD, 1 Units at 12/18/16 1159 .  irbesartan (AVAPRO) tablet 150 mg, 150 mg, Oral, Daily, Gardner, Jared M, DO, 150 mg at 12/18/16 1126 .  labetalol (NORMODYNE,TRANDATE) injection 5-10 mg, 5-10 mg, Intravenous, Q2H PRN, Gardner, Jared M, DO, 10 mg at 12/15/16 0648 .  phenol (CHLORASEPTIC) mouth spray 1 spray, 1 spray, Mouth/Throat, PRN, Vega, Orlando, MD, 1 spray at 12/18/16 0521 .  RESOURCE THICKENUP CLEAR, , Oral, PRN, Rizwan, Saima, MD .  senna-docusate (Senokot-S) tablet 1 tablet, 1 tablet, Oral, QHS PRN, Gardner, Jared M, DO  Patients Current Diet: Diet regular Room service appropriate? 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 None of the above  Prior Functional Level Prior Function Level of Independence: Independent Comments: works at [employer] part time, rides bus.  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  Independent  Indoor Mobility: Did the patient need assistance with walking from room  to room (with or without device)? Independent  Stairs: Did the patient need assistance with internal or external stairs (with or without device)? Independent  Functional Cognition: Did the patient need help planning regular tasks such as shopping or remembering to take medications? Independent  Current Functional Level Cognition  Arousal/Alertness: Awake/alert Overall Cognitive Status: Within Functional Limits for tasks assessed Orientation Level: Oriented X4 General Comments: Cognition intact for basic mobility; will need further assessment. Laughs appropriately.  Attention: Selective Selective Attention: Appears intact Memory: Appears intact Awareness: Appears intact Problem Solving: Impaired Problem Solving Impairment: Verbal complex Executive Function: Reasoning Safety/Judgment: Appears intact    Extremity Assessment (includes Sensation/Coordination)  Upper Extremity Assessment:   LUE deficits/detail LUE Deficits / Details: Pt demonstrates movement in Brunnstrom beginning stage IV; hand Brunnstrom stage IV LUE Sensation: decreased light touch, decreased proprioception LUE Coordination: decreased fine motor, decreased gross motor  Lower Extremity Assessment: LLE deficits/detail LLE Deficits / Details: patient demonstrates decreased gross strength 3/5 most motions LLE Sensation: decreased light touch LLE Coordination: decreased fine motor, decreased gross motor    ADLs  Overall ADL's : Needs assistance/impaired Eating/Feeding: NPO Grooming: Moderate assistance, Standing, Wash/dry face, Oral care Grooming Details (indicate cue type and reason): Assist for toothpaste and balance in standing. Cues for upright posture Upper Body Bathing: Maximal assistance, Sitting, Bed level Lower Body Bathing: Total assistance, Bed level Upper Body Dressing : Maximal assistance, Sitting Lower Body Dressing: Maximal assistance Lower Body Dressing Details (indicate cue type and reason):  while seated EOB, pt needed total A to don left sock and Mod A to don right sock (could not get it started but could finish it) with RUE Toilet Transfer: Maximal assistance, +2 for physical assistance, Ambulation Toilet Transfer Details (indicate cue type and reason): Simulated by sit to stand from EOB with functional mobility in room. Toileting- Clothing Manipulation and Hygiene: Total assistance, Bed level Functional mobility during ADLs: Maximal assistance, +2 for physical assistance(2 person HHA) General ADL Comments: Pt limited to brief EOB activity due to elevated BP     Mobility  Overal bed mobility: Needs Assistance Bed Mobility: Rolling, Sidelying to Sit Rolling: Supervision Sidelying to sit: Min guard Sit to sidelying: Min assist General bed mobility comments: Able to initate LEs to EOB and utilize rail to pull to upright, light assist to bring trunk to midline    Transfers  Overall transfer level: Needs assistance Equipment used: 2 person hand held assist Transfers: Sit to/from Stand, Stand Pivot Transfers Sit to Stand: Min assist, Mod assist, +2 physical assistance Stand pivot transfers: Mod assist, +2 physical assistance General transfer comment: +2 Min assist to power up from bed; multi-modal cues  for trunk extension and facilitation of lateral wt shift to take pivotal steps to chair moving toward pt right side; pt hyperextends L knee to WB;     Ambulation / Gait / Stairs / Wheelchair Mobility  Ambulation/Gait Ambulation/Gait assistance: Max assist, +2 physical assistance Ambulation Distance (Feet): 8 Feet Assistive device: 2 person hand held assist Gait Pattern/deviations: Step-to pattern, Decreased stride length, Trunk flexed(wrap around support) General Gait Details: facilitation techniques required to initiate stride, Max multi modal cues for upright and weight shift bilaterally. Increased physical assist to block LLE and maintain posture and positioning during  gait Gait velocity: decreased Gait velocity interpretation: <1.8 ft/sec, indicative of risk for recurrent falls    Posture / Balance Dynamic Sitting Balance Sitting balance - Comments: Able to initiate thoracic and lumbar extension for upright posture but only able to sustain for seconds and then fatigues.  Balance Overall balance assessment: Needs assistance Sitting-balance support: Feet supported, No upper extremity supported Sitting balance-Leahy Scale: Fair Sitting balance - Comments: Able to initiate thoracic and lumbar extension for upright posture but only able to sustain for seconds and then fatigues.  Postural control: Left lateral lean Standing balance support: During functional activity Standing balance-Leahy Scale: Poor Standing balance comment: Able to stand statically with external support of 2. Cues for upright posture. Left knee hyperextended to support weight and prevent buckling. Weighshifting done with Mod A of 2. Fatigues easily    Special needs/care consideration BiPAP/CPAP  N/a CPM  N/a Continuous Drip IV  N/a   Dialysis n/a Life Vest  N/a Oxygen  N/a Special Bed  N/a Trach Size  N/a Wound Vac n/a Skin  intact Bowel mgmt: LBM 12/17/2016 Bladder mgmt: condom catheter Diabetic mgmt  N/a   Previous Home Environment Living Arrangements: Spouse/significant other(and 62 year old daughter)  Lives With: Spouse, Daughter Available Help at Discharge: Family, Available PRN/intermittently(wife works 8 until 5 pm daily; daughter in school) Type of Home: House Home Layout: One level Home Access: Stairs to enter Entrance Stairs-Rails: None Technical brewer of Steps: 2 Bathroom Shower/Tub: Public librarian, Architectural technologist: Programmer, systems: Yes How Accessible: Accessible via walker Home Care Services: No Additional Comments: wife works Engineer, agricultural for Discharge Living Setting: Patient's home, Lives with  (comment)(wife and 12 year old daughter) Type of Home at Discharge: House Discharge Home Layout: One level Discharge Home Access: Stairs to enter Entrance Stairs-Rails: None Entrance Stairs-Number of Steps: 2 Discharge Bathroom Shower/Tub: Tub/shower unit, Curtain Discharge Bathroom Toilet: Standard Discharge Bathroom Accessibility: Yes How Accessible: Accessible via walker Does the patient have any problems obtaining your medications?: Yes (Describe)(pt uninsured so ran out of meds over past few months)  Social/Family/Support Systems Patient Roles: Spouse, Parent Contact Information: [name], wife Anticipated Caregiver: wife, daughter Anticipated Caregiver's Contact Information: [phone] Ability/Limitations of Caregiver: wife works as Secretary/administrator 8 until 5 daily; able to make contact with her 11 until 1130 daily Caregiver Availability: Evenings only Discharge Plan Discussed with Primary Caregiver: Yes Is Caregiver In Agreement with Plan?: Yes Does Caregiver/Family have Issues with Lodging/Transportation while Pt is in Rehab?: No   Goals/Additional Needs Patient/Family Goal for Rehab: Mod I to supervision at Wheelchair level with PT, suerpvision to min assist with OT, Mod I to supervision with SLP Expected length of stay: ELOS 10- 14 days Special Service Needs: Plan for ELOS 2 weeks ; if not at a level to d/c directly home, Medical director of Case Management department will seek SNF placement with 30 day LOG. Additional Information: I discussed case with Georga Kaufmann and Dr. Reynaldo Minium of Case Management as well as Rehab Director, Larina Bras Pt/Family Agrees to Admission and willing to participate: Yes Program Orientation Provided & Reviewed with Pt/Caregiver Including Roles  & Responsibilities: Yes  Barriers to Discharge: Decreased caregiver support, Lack of/limited family support, Insurance for SNF coverage  Barriers to Discharge Comments: LOG after CIR if needed discussed with  Care Management Georga Kaufmann, Dr. Reynaldo Minium and Rehab director, Larina Bras on 12/18/2016(Dr. Reynaldo Minium to be notified 7 days prior to planned d/c )  Decrease burden of Care through IP rehab admission: Decrease number of caregivers, Bowel and bladder program and Patient/family education  Possible need for SNF placement upon discharge: Have discussed with pt and wife estimated LOS of 2 weeks to give as much therapy upfront as possible. If pt not at a level to d/c home directly at that time at a wheelchair level with wife working, Training and development officer will assist with SNF placement for just a couple of weeks. Goal would be to get patient at home at a level that they can manage as soon as realistic. Wife and patient to begin making arrangements for home and to assist with medicaid eligibility.   Patient Condition: This patient's medical and functional status has changed since the consult dated: 12/15/2016 in which the Rehabilitation Physician determined and documented that the patient's condition is appropriate for intensive rehabilitative care in an inpatient rehabilitation facility. See "History of Present Illness" (above) for medical  update. Functional changes are: mod assist. Patient's medical and functional status update has been discussed with the Rehabilitation physician and patient remains appropriate for inpatient rehabilitation. Will admit to inpatient rehab today.  Preadmission Screen Completed By:  Boyette, Barbara Godwin, 12/18/2016 12:29 PM ______________________________________________________________________   Discussed status with Dr. Kirsteins on 12/18/2016 at  1240 and received telephone approval for admission today.  Admission Coordinator:  Boyette, Barbara Godwin, time 1240 Date 12/18/2016             Cosigned by: Kirsteins, Andrew E, MD at 12/18/2016 12:43 PM  Revision History                    

## 2016-12-18 NOTE — Progress Notes (Signed)
Physical Therapy Treatment Patient Details Name: [name] MRN: [id number] DOB: 06-28-1965 Today's Date: 12/18/2016    History of Present Illness 51 y.o.malewith medical history significant ofDM, HTN, and a chronic brain mass that's likely a meningioma. Patient presents to the ED with c/o L sided weakness. CT head just shows chronic brain mass with vasogenic edema, essentially unchanged from a year ago. 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    PT Comments    Pt is very cooperative and progressing toward goals; It appears pt will need SNF d/t decr caregiver support (wife works); plan updated, will continue to follow in acute setting;   Follow Up Recommendations  SNF     Equipment Recommendations  Other (comment)(defer to next venue)    Recommendations for Other Services       Precautions / Restrictions Precautions Precautions: Fall Restrictions Weight Bearing Restrictions: No    Mobility  Bed Mobility Overal bed mobility: Needs Assistance Bed Mobility: Rolling;Sidelying to Sit Rolling: Supervision Sidelying to sit: Min guard       General bed mobility comments: Able to initate LEs to EOB and utilize rail to pull to upright, light assist to bring trunk to midline  Transfers Overall transfer level: Needs assistance Equipment used: 2 person hand held assist Transfers: Sit to/from [facility] Sit to Stand: Min assist;Mod assist;+2 physical assistance Stand pivot transfers: Mod assist;+2 physical assistance       General transfer comment: +2 Min assist to power up from bed; multi-modal cues  for trunk extension and facilitation of lateral wt shift to take pivotal steps to chair moving toward pt right side; pt hyperextends L knee to WB;   Ambulation/Gait                 Stairs            Wheelchair Mobility    Modified Rankin (Stroke Patients Only)       Balance  Overall balance assessment: Needs assistance Sitting-balance support: Feet supported;No upper extremity supported Sitting balance-Leahy Scale: Fair Sitting balance - Comments: Able to initiate thoracic and lumbar extension for upright posture but only able to sustain for seconds and then fatigues.  Postural control: Left lateral lean Standing balance support: During functional activity Standing balance-Leahy Scale: Poor Standing balance comment: Able to stand statically with external support of 2. Cues for upright posture. Left knee hyperextended to support weight and prevent buckling. Weighshifting done with Mod A of 2. Fatigues easily                            Cognition Arousal/Alertness: Awake/alert Behavior During Therapy: WFL for tasks assessed/performed Overall Cognitive Status: Within Functional Limits for tasks assessed                                        Exercises Other Exercises Other Exercises: Bil shoulder shrugs x5, LUE shoulder flexion AAROM x5, pt initiates  AROM--AA to move through incr range Other Exercises: Bilateral LEs Long Arc quads x 10  with facilitation     General Comments        Pertinent Vitals/Pain Pain Assessment: No/denies pain    Home Living                      Prior Function  PT Goals (current goals can now be found in the care plan section) Acute Rehab PT Goals Patient Stated Goal: To get better  PT Goal Formulation: With patient Time For Goal Achievement: 12/27/16 Potential to Achieve Goals: Good Progress towards PT goals: Progressing toward goals    Frequency    Min 3X/week      PT Plan Discharge plan needs to be updated    Co-evaluation              AM-PAC PT "6 Clicks" Daily Activity  Outcome Measure  Difficulty turning over in bed (including adjusting bedclothes, sheets and blankets)?: None Difficulty moving from lying on back to sitting on the side of the bed? :  Unable Difficulty sitting down on and standing up from a chair with arms (e.g., wheelchair, bedside commode, etc,.)?: Unable Help needed moving to and from a bed to chair (including a wheelchair)?: A Lot Help needed walking in hospital room?: Total Help needed climbing 3-5 steps with a railing? : Total 6 Click Score: 10    End of Session Equipment Utilized During Treatment: Gait belt Activity Tolerance: Patient tolerated treatment well Patient left: in chair;with call bell/phone within reach;with chair alarm set   PT Visit Diagnosis: Other symptoms and signs involving the nervous system (R29.898)     Time: 4503-8882 PT Time Calculation (min) (ACUTE ONLY): 19 min  Charges:  $Therapeutic Activity: 8-22 mins                    G Codes:          [name] 12/22/2016, 10:50 AM

## 2016-12-18 NOTE — H&P (Signed)
Physical Medicine and Rehabilitation Admission H&P       Chief Complaint  Patient presents with  . Weakness  : HPI: Mark Wilkerson Whitakeris a 51 y.o.right handed malewith history of hypertension, diabetes mellitus and a chronic calcified brain mass likely meningioma. Per chart reviewand patient, patientlives with spouse. Independent prior to admission. Works at [employer] part time. One level home to steps to entry.Wife works during the day.Presented 12/12/2016 with left-sided weakness. Noted blood pressure 240/140. Patient had reported he recently ran out of his clonidine and ARB a few months ago with financial constraints. Cranial CT scan reviewed, unremarkable for acute process. Per report, stable right medial frontal region calcified mass in vasogenic edema right frontal lobe. Stable bilateral anterior frontal lobe encephalomalacia. Patient did not receive TPA. CT angiogram head and neck negative for larger proximal arterial branch occlusion. MRI showed acute right paramedian brainstem infarct affecting lower pons and upper medulla. Heavy calcified mass probably intra-axial involves the medial right hemisphere slightly increased from 2016. Echocardiogram with ejection fraction of 40% grade 1 diastolic dysfunction no source of emboli. Maintained on aspirin for CVA prophylaxis. Subcutaneous Lovenox for DVT prophylaxis. Initially on Mechanical soft nectar thick liquid diet with follow-up swallow study and diet advanced to a regular consistency with thin liquids. Physical and occupational therapy evaluations completed with recommendations of physical medicine rehabilitation consult. Patient was admitted for a comprehensive rehabilitation program  Patient without current complaints, states that bowel and bladder are doing well  Review of Systems  Constitutional: Negative for chills and fever.  HENT: Negative for hearing loss.   Eyes: Negative for blurred vision and double vision.  Respiratory:  Negative for cough and shortness of breath.   Cardiovascular: Negative for chest pain, palpitations and leg swelling.  Gastrointestinal: Positive for constipation. Negative for nausea and vomiting.  Genitourinary: Negative for dysuria, flank pain and hematuria.  Skin: Negative for rash.  Neurological: Positive for speech change and focal weakness.  All other systems reviewed and are negative.      Past Medical History:  Diagnosis Date  . Colon cancer ([place])    rectal  . Diabetes mellitus without complication ([place])   . Hypertension   . Seizure [facility])         Past Surgical History:  Procedure Laterality Date  . APPENDECTOMY    . JOINT REPLACEMENT Left 2004   Left hip  . TOTAL HIP ARTHROPLASTY          Family History  Problem Relation Age of Onset  . Hypertension Mother   . Heart disease Mother   . Hyperlipidemia Mother   . Heart failure Mother   . Hypertension Sister   . Hypertension Brother   . Stroke Father   . Hypertension Father   . Colon cancer Maternal Grandmother   . Diabetes Maternal Grandmother    Social History:  reports that  has never smoked. he has never used smokeless tobacco. He reports that he drinks alcohol. He reports that he does not use drugs. Allergies: No Known Allergies       Medications Prior to Admission  Medication Sig Dispense Refill  . amLODipine (NORVASC) 10 MG tablet Take 1 tablet (10 mg total) by mouth daily. 30 tablet 1  . ibuprofen (ADVIL,MOTRIN) 200 MG tablet Take 200-400 mg by mouth every 6 (six) hours as needed for fever, headache or mild pain.    Marland Kitchen JANUMET XR [phone] MG TB24 Take 1 tablet by mouth daily.    . valsartan (DIOVAN) 160  MG tablet Take 160 mg by mouth daily.      Drug Regimen Review Drug regimen was reviewed and remains appropriate with no significant issues identified  Home: Home Living Family/patient expects to be discharged to:: Other (Comment) Living Arrangements:  Spouse/significant other Available Help at Discharge: Family Type of Home: House Home Access: Stairs to enter Technical brewer of Steps: 2 Entrance Stairs-Rails: None Home Layout: One level Bathroom Shower/Tub: Chiropodist: Standard Home Equipment: Environmental consultant - 2 wheels, [employer] - single point, Bedside commode, Shower seat, Toilet riser, Grab bars - tub/shower Additional Comments: Pt reports spouse and daughter work part time   Lives With: Spouse, Daughter   Functional History: Prior Function Level of Independence: Independent Comments: 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 Used left bed rail to roll. Transfers Overall transfer level: Needs assistance Equipment used: 2 person hand held assist Transfers: Stand Pivot Transfers Sit to Stand: Min assist, +2 physical assistance Stand pivot transfers: Max assist, +2 physical assistance General transfer comment: Assist of 2 to power to standing using back of chair to pull up; Pt hyperextends left knee to stabilize. Stood from EOB x2. + dizziness with all movements secondary to nystagmus and diplopia. Able to take a few steps to get to chair with cues for sequencing and advancing each LE, assist for weight shift and to prevent left knee from buckling with WB. Ambulation/Gait General Gait Details: Deferred.   ADL: ADL Overall ADL's : Needs assistance/impaired Eating/Feeding: NPO Grooming: Wash/dry hands, Wash/dry face, Sitting Upper Body Bathing: Maximal assistance, Sitting, Bed level Lower Body Bathing: Total assistance, Bed level Upper  Body Dressing : Maximal assistance, Sitting Lower Body Dressing: Maximal assistance Lower Body Dressing Details (indicate cue type and reason): while seated EOB, pt needed total A to don left sock and Mod A to don right sock (could not get it started but could finish it) with RUE Toilet Transfer: Maximal assistance, +2 for physical assistance, Stand-pivot Toilet Transfer Details (indicate cue type and reason): bed>recliner going to pt's right Toileting- Clothing Manipulation and Hygiene: Total assistance, Bed level General ADL Comments: Pt limited to brief EOB activity due to elevated BP   Cognition: Cognition Overall Cognitive Status: Within Functional Limits for tasks assessed Arousal/Alertness: Awake/alert Orientation Level: Oriented X4 Attention: Selective Selective Attention: Appears intact Memory: Appears intact Awareness: Appears intact Problem Solving: Impaired Problem Solving Impairment: Verbal complex Executive Function: Reasoning Safety/Judgment: Appears intact Cognition Arousal/Alertness: Awake/alert Behavior During Therapy: WFL for tasks assessed/performed Overall Cognitive Status: Within Functional Limits for tasks assessed General Comments: Cognition intact for basic mobility; will need further assessment. Laughs appropriately.   Physical Exam: Blood pressure (!) 176/105, pulse 77, temperature 98.5 F (36.9 C), temperature source Oral, resp. rate 20, height 5\' 3"  (1.6 m), weight 99.8 kg (220 lb), SpO2 98 %. Physical Exam  Vitals reviewed. HENT:  Head: Normocephalic.  Eyes: EOM are normal.  Neck: Normal range of motion. Neck supple. No tracheal deviation present. No thyromegaly present.  Cardiovascular: Normal rate, regular rhythm and normal heart sounds.  Respiratory: Effort normal and breath sounds normal. No respiratory distress.  GI: Soft. Bowel sounds are  normal. He exhibits no distension.   Skin. Warm and dry Musculoskeletal: No edema or tenderness in  extremities Neurological: He isalertand oriented to person, place, and time. Follow simple commands Motor: RUE/RLE: 5/5 proximal to distal LUE: 2- horizontal adduction 2- at the biceps 0 at the triceps 0 at the deltoid 0 at the finger flexors and extensors LLE: 4- hip flexion 4- knee extension 0 at the left ankle dorsiflexors and plantar flexors Sensation diminished to light touch LUE/LLE     LabResultsLast48Hours  Results for orders placed or performed during the hospital encounter of 12/12/16 (from the past 48 hour(s))  Glucose, capillary     Status: Abnormal   Collection Time: 12/14/16  6:59 AM  Result Value Ref Range   Glucose-Capillary 128 (H) 65 - 99 mg/dL  Glucose, capillary     Status: Abnormal   Collection Time: 12/14/16 11:31 AM  Result Value Ref Range   Glucose-Capillary 128 (H) 65 - 99 mg/dL   Comment 1 Notify RN    Comment 2 Document in Chart   Glucose, capillary     Status: Abnormal   Collection Time: 12/14/16  4:27 PM  Result Value Ref Range   Glucose-Capillary 120 (H) 65 - 99 mg/dL   Comment 1 Notify RN    Comment 2 Document in Chart   Glucose, capillary     Status: Abnormal   Collection Time: 12/14/16  8:51 PM  Result Value Ref Range   Glucose-Capillary 134 (H) 65 - 99 mg/dL   Comment 1 Notify RN    Comment 2 Document in Chart   Glucose, capillary     Status: Abnormal   Collection Time: 12/15/16  6:40 AM  Result Value Ref Range   Glucose-Capillary 123 (H) 65 - 99 mg/dL   Comment 1 Notify RN    Comment 2 Document in Chart   Glucose, capillary     Status: Abnormal   Collection Time: 12/15/16 12:30 PM  Result Value Ref Range   Glucose-Capillary 167 (H) 65 - 99 mg/dL  Glucose, capillary     Status: Abnormal   Collection Time: 12/15/16  4:35 PM  Result Value Ref Range   Glucose-Capillary 120 (H) 65 - 99 mg/dL  Glucose, capillary     Status: Abnormal   Collection Time: 12/15/16  9:08 PM  Result Value Ref Range    Glucose-Capillary 163 (H) 65 - 99 mg/dL   Comment 1 Notify RN    Comment 2 Document in Chart      ImagingResults(Last48hours)  No results found.       Medical Problem List and Plan: 1.  Left side weakness/dysphagia secondary to right paramedian brainstem infarct/pons/Medulla as well as history of calcified chronic brain mass: Initiate rehabilitation in a.m. 2.  DVT Prophylaxis/Anticoagulation: Subcutaneous Lovenox. Monitor for any bleeding episodes 3. Pain Management: Tylenol as needed 4. Mood: Provide emotional support 5. Neuropsych: This patient is capable of making decisions on his own behalf. 6. Skin/Wound Care: Routine skin checks 7. Fluids/Electrolytes/Nutrition: Routine I&O's with follow-up chemistries 8. Hypertension. Norvasc 10 mg daily, Coreg 25 mg twice a day, Avapro 150 mg daily. Monitor with increased mobility 9. Hyperlipidemia. Lipitor 10. Diabetes mellitus peripheral neuropathy. Hemoglobin A1c 6.2. SSI. Check blood sugars before meals and at bedtime. Patient on Janumet-XR [phone] milligrams daily. Resume as needed    Post Admission Physician Evaluation: 1. Functional deficits secondary  to right paramedian pontomedullary infarct likely small vessel disease. 2. Patient is admitted to receive collaborative, interdisciplinary care between the  physiatrist, rehab nursing staff, and therapy team. 3. Patient's level of medical complexity and substantial therapy needs in context of that medical necessity cannot be provided at a lesser intensity of care such as a SNF. 4. Patient has experienced substantial functional loss from his/her baseline which was documented above under the "Functional History" and "Functional Status" headings.  Judging by the patient's diagnosis, physical exam, and functional history, the patient has potential for functional progress which will result in measurable gains while on inpatient rehab.  These gains will be of substantial and  practical use upon discharge  in facilitating mobility and self-care at the household level. 5. Physiatrist will provide 24 hour management of medical needs as well as oversight of the therapy plan/treatment and provide guidance as appropriate regarding the interaction of the two. 6. The Preadmission Screening has been reviewed and patient status is unchanged unless otherwise stated above. 7. 24 hour rehab nursing will assist with bladder management, bowel management, safety, skin/wound care, disease management, medication administration, pain management and patient education  and help integrate therapy concepts, techniques,education, etc. 8. PT will assess and treat for/with: pre gait, gait training, endurance , safety, equipment, neuromuscular re education.   Goals are: Sup. 9. OT will assess and treat for/with: ADLs, Cognitive perceptual skills, Neuromuscular re education, safety, endurance, equipment.   Goals are: Sup. Therapy may proceed with showering this patient. 10. SLP will assess and treat for/with: NA.  Goals are: NA. 11. Case Management and Social Worker will assess and treat for psychological issues and discharge planning. 12. Team conference will be held weekly to assess progress toward goals and to determine barriers to discharge. 13. Patient will receive at least 3 hours of therapy per day at least 5 days per week. 14. ELOS: 18-21d       15. Prognosis:  excellent     Charlett Blake M.D. [facility] FAAPM&R (Sports Med, Neuromuscular Med) Diplomate Am Board of Electrodiagnostic Med  Cathlyn Parsons, PA-C 12/16/2016

## 2016-12-18 NOTE — Progress Notes (Signed)
  Speech Language Pathology Treatment: Cognitive-Linquistic  Patient Details Name: [name] MRN: [id number] DOB: 1965-11-21 Today's Date: 12/18/2016 Time: 9906-8934 SLP Time Calculation (min) (ACUTE ONLY): 14 min  Assessment / Plan / Recommendation Clinical Impression  Treatment focused on cognitive linguistic goals. Administered the Cognistat as part of diagnostic treatment as cognition appears to be improving since initial evaluation. Patient scoring in the normal range for all areas assessed. Mild residuals dysarthria remains with intelligibility at 75-100% during conversation. Educated patient on compensatory strategies to facilitate intelligible verbal output with patient verbalizing understanding. Discharge planning underway. Defer further SLP f/u for dysarthria to next venue of care.    HPI HPI: Mark Ang Whitakeris a 51 y.o.malewith medical history significant ofDM, HTN, and a chronic brain mass that's likely a meningioma. Patient presents to the ED with c/o L sided weakness. LKW when he went to bed last night. Woke up at 1AM with weakness. Went back to sleep. Still present when he woke up again in AM. MRI 12/13/16 showed Acute RIGHT paramedian brainstem infarct affecting lower pons and upper medulla.Heavily calcified mass, probably intra-axial, involves the medial RIGHT hemisphere, slightly increased from 2016.      SLP Plan  All goals met(d/c further treatment to next venue of care)       Recommendations                   Oral Care Recommendations: Oral care BID Follow up Recommendations: Inpatient Rehab SLP Visit Diagnosis: Dysarthria and anarthria (R47.1) Plan: All goals met(d/c further treatment to next venue of care)       [facility], [place] [phone]  [place] 12/18/2016, 11:42 AM

## 2016-12-18 NOTE — Progress Notes (Signed)
I have further discussed case with Georga Kaufmann and Dr. Reynaldo Minium of Case management as well as Rehab director, Larina Bras. I met with patient at bedside and then contacted his wife by phone. We will make the arrangements to admit pt to inpt rehab today with ELOS of 10- 14 days. If patient unable to d/c directly home after CIR, Care Management will assist with locating SNF with LOG for a few weeks. Wife and patient aware that ultimate goal is to d/c home as soon as realistic, and that they need to be working on those arrangements . Both in agreement. I have alerted RN CM , SW and Dr. Wendee Beavers of these plans. I will make the arrangements to admit pt today. [phone]

## 2016-12-19 ENCOUNTER — Inpatient Hospital Stay (HOSPITAL_COMMUNITY): Payer: Self-pay | Admitting: Occupational Therapy

## 2016-12-19 ENCOUNTER — Inpatient Hospital Stay (HOSPITAL_COMMUNITY): Payer: Self-pay | Admitting: Physical Therapy

## 2016-12-19 ENCOUNTER — Inpatient Hospital Stay (HOSPITAL_COMMUNITY): Payer: Medicaid Other | Admitting: Speech Pathology

## 2016-12-19 LAB — COMPREHENSIVE METABOLIC PANEL
ALBUMIN: 3.2 g/dL — AB (ref 3.5–5.0)
ALT: 25 U/L (ref 17–63)
AST: 18 U/L (ref 15–41)
Alkaline Phosphatase: 96 U/L (ref 38–126)
Anion gap: 9 (ref 5–15)
BUN: 18 mg/dL (ref 6–20)
CHLORIDE: 105 mmol/L (ref 101–111)
CO2: 25 mmol/L (ref 22–32)
Calcium: 9.4 mg/dL (ref 8.9–10.3)
Creatinine, Ser: 1.17 mg/dL (ref 0.61–1.24)
GFR calc Af Amer: >60 mL/min (ref >60)
GFR calc non Af Amer: >60 mL/min (ref >60)
GLUCOSE: 129 mg/dL — AB (ref 65–99)
POTASSIUM: 4.1 mmol/L (ref 3.5–5.1)
SODIUM: 139 mmol/L (ref 135–145)
Total Bilirubin: 0.5 mg/dL (ref 0.3–1.2)
Total Protein: 7.3 g/dL (ref 6.5–8.1)

## 2016-12-19 LAB — CBC WITH DIFFERENTIAL/PLATELET
BASOS ABS: 0 10*3/uL (ref 0.0–0.1)
BASOS PCT: 0 %
EOS PCT: 2 %
Eosinophils Absolute: 0.2 10*3/uL (ref 0.0–0.7)
HCT: 39.1 % (ref 39.0–52.0)
Hemoglobin: 13 g/dL (ref 13.0–17.0)
Lymphocytes Relative: 19 %
Lymphs Abs: 1.5 10*3/uL (ref 0.7–4.0)
MCH: 25.1 pg — ABNORMAL LOW (ref 26.0–34.0)
MCHC: 33.2 g/dL (ref 30.0–36.0)
MCV: 75.5 fL — ABNORMAL LOW (ref 78.0–100.0)
MONO ABS: 0.7 10*3/uL (ref 0.1–1.0)
Monocytes Relative: 9 %
Neutro Abs: 5.6 10*3/uL (ref 1.7–7.7)
Neutrophils Relative %: 70 %
PLATELETS: 324 10*3/uL (ref 150–400)
RBC: 5.18 MIL/uL (ref 4.22–5.81)
RDW: 15 % (ref 11.5–15.5)
WBC: 8 10*3/uL (ref 4.0–10.5)

## 2016-12-19 LAB — GLUCOSE, CAPILLARY
GLUCOSE-CAPILLARY: 128 mg/dL — AB (ref 65–99)
GLUCOSE-CAPILLARY: 189 mg/dL — AB (ref 65–99)
Glucose-Capillary: 125 mg/dL — ABNORMAL HIGH (ref 65–99)
Glucose-Capillary: 100 mg/dL — ABNORMAL HIGH (ref 65–99)

## 2016-12-19 MED ORDER — IRBESARTAN 300 MG PO TABS
300.0000 mg | ORAL_TABLET | Freq: Every day | ORAL | Status: DC
  Administered 2016-12-19 – 2017-01-08 (×21): 300 mg via ORAL
  Filled 2016-12-19 (×21): qty 1

## 2016-12-19 NOTE — Evaluation (Signed)
Occupational Therapy Assessment and Plan  Patient Details  Name: [name] MRN: [id number] Date of Birth: 11/15/1965  OT Diagnosis: abnormal posture, hemiplegia affecting non-dominant side and muscle weakness (generalized) Rehab Potential: Rehab Potential (ACUTE ONLY): Good ELOS: 24-28 days   Today's Date: 12/19/2016 OT Individual Time: 0900-1000 OT Individual Time Calculation (min): 60 min     Problem List:  Patient Active Problem List   Diagnosis Date Noted  . Right pontine cerebrovascular accident ([place]) 12/18/2016  . Left hemiparesis ([place])   . Diastolic dysfunction   . Dysphagia, post-stroke   . AKI (acute kidney injury) ([place])   . Prediabetes   . Acute ischemic stroke ([place]) 12/12/2016  . Acute respiratory failure with hypercapnia ([place])   . Seizures ([place]) 07/22/2014  . Hypertension 07/22/2014  . Hyperglycemia 07/22/2014  . Type 2 diabetes mellitus with vascular disease ([place]) 07/22/2014  . Pyrexia   . Brain mass   . Malignant neoplasm of rectum ([place]) 09/02/2007    Past Medical History:  Past Medical History:  Diagnosis Date  . Colon cancer ([place])    rectal  . Diabetes mellitus without complication ([place])   . Hypertension   . Seizure [facility])    Past Surgical History:  Past Surgical History:  Procedure Laterality Date  . APPENDECTOMY    . JOINT REPLACEMENT Left 2004   Left hip  . TOTAL HIP ARTHROPLASTY      Assessment & Plan  Clinical Impression: Patient is a 51 y.o. year old male with recent admission to the hospital on 12/12/2016 with left-sided weakness. Noted blood pressure 240/140. Patient had reported he recently ran out of his clonidine and ARB a few months ago with financial constraints. Cranial CT scan reviewed, unremarkable for acute process. Per report, stable right medial frontal region calcified mass in vasogenic edema right frontal lobe. Stable bilateral anterior frontal lobe encephalomalacia. Patient did not receive TPA. CT angiogram head and neck  negative for larger proximal arterial branch occlusion. MRI showed acute right paramedian brainstem infarct affecting lower pons and upper medulla. Heavy calcified mass probably intra-axial involves the medial right hemisphere slightly increased from 2016. Patient transferred to CIR on 12/18/2016   Patient currently requires max with basic self-care skills secondary to muscle weakness, abnormal tone, unbalanced muscle activation and decreased motor planning and decreased sitting balance, decreased standing balance, decreased postural control, hemiplegia and decreased balance strategies.  Prior to hospitalization, patient could complete BADL with independent .  Patient will benefit from skilled intervention to increase independence with basic self-care skills prior to discharge home with care partner.  Anticipate patient will require intermittent supervision and follow up home health.  OT - End of Session Endurance Deficit: Yes Endurance Deficit Description: Rest breaks required within BADL OT Assessment Rehab Potential (ACUTE ONLY): Good OT Patient demonstrates impairments in the following area(s): Balance;Endurance;Motor;Vision;Sensory OT Basic ADL's Functional Problem(s): Eating;Bathing;Grooming;Dressing;Toileting OT Transfers Functional Problem(s): Toilet;Tub/Shower OT Additional Impairment(s): Fuctional Use of Upper Extremity OT Plan OT Intensity: Minimum of 1-2 x/day, 45 to 90 minutes OT Frequency: 5 out of 7 days OT Duration/Estimated Length of Stay: 24-28 days OT Treatment/Interventions: Medical illustrator training;Community reintegration;Discharge planning;DME/adaptive equipment instruction;Functional electrical stimulation;Functional mobility training;Neuromuscular re-education;Patient/family education;Psychosocial support;Self Care/advanced ADL retraining;Therapeutic Activities;Therapeutic Exercise;UE/LE Strength taining/ROM;Visual/perceptual remediation/compensation;UE/LE Coordination  activities OT Self Feeding Anticipated Outcome(s): Mod I OT Basic Self-Care Anticipated Outcome(s): Mod I OT Toileting Anticipated Outcome(s): Supevision  OT Bathroom Transfers Anticipated Outcome(s): Supervision OT Recommendation Recommendations for Other Services: Neuropsych consult;Therapeutic Recreation consult Therapeutic Recreation Interventions: Kitchen group;Outing/community reintergration(eventually)  Patient destination: Home Follow Up Recommendations: Home health OT Equipment Recommended: Tub/shower bench  Skilled Therapeutic Intervention Initial eval completed with treatment provided to address functional transfers, functional use of L UE, improved sit<>stand, standing tolerance, and adapted bathing/dressing skills. Pt came to sitting EOB with mod A. Squat-pivot transfers bed>wc on strong R side with Mod A. Attempted squat-piot to weaker L side to shower chair, but pt unable to safely complete with Max A. OT utilized Stedy to transfer pt into shower with Max A lift and lower assist. LLE externally rotates when standing requiring knee block. Incorporated L NMR weight bearing techniques within bathing tasks with hand over hand A to grasp wash cloth. LB/UB dressing completed wc level at the sink with overall Max A 2/2 L hemiplegia.  Incorporate weight bearing through L UE when standing at the sink. Pt left seated in wc at end of session with safety belt on, call bell in reach, and needs met.    OT Evaluation Precautions/Restrictions  Precautions Precautions: Fall Restrictions Weight Bearing Restrictions: No Pain Pain Assessment Pain Assessment: No/denies pain Pain Score: 0-No pain Home Living/Prior Functioning Home Living Family/patient expects to be discharged to:: Private residence Living Arrangements: Spouse/significant other Available Help at Discharge: Family, Available PRN/intermittently Type of Home: House Home Access: Stairs to enter Technical brewer of Steps:  2 Entrance Stairs-Rails: None Home Layout: One level Bathroom Shower/Tub: Tub/shower unit, Industrial/product designer: Yes Additional Comments: wife works fulltime  Lives With: Spouse, Daughter IADL History Current License: Yes Leisure and Hobbies: Enjoys cooking Prior Function Level of Independence: Independent with basic ADLs, Independent with homemaking with ambulation  Able to Take Stairs?: Yes Driving: No 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, hospital  ADL ADL ADL Comments: Please see functional navigato Vision Baseline Vision/History: Wears glasses Wears Glasses: At all times Patient Visual Report: Blurring of vision Vision Assessment?: Vision impaired- to be further tested in functional context Ocular Range of Motion: Within Functional Limits Perception  Perception: Within Functional Limits Praxis Praxis: Impaired Praxis Impairment Details: Motor planning Cognition Overall Cognitive Status: Within Functional Limits for tasks assessed Arousal/Alertness: Awake/alert Orientation Level: Person;Situation;Place Person: Oriented Place: Oriented Situation: Oriented Year: 2018 Month: December Day of Week: Correct Memory: Appears intact Immediate Memory Recall: Sock;Blue;Bed Memory Recall: Sock;Blue;Bed Memory Recall Sock: Without Cue Memory Recall Blue: Without Cue Memory Recall Bed: Without Cue Selective Attention: Appears intact Awareness: Appears intact Problem Solving: Impaired Safety/Judgment: Appears intact Sensation Sensation Light Touch: Impaired Detail Light Touch Impaired Details: Impaired LLE;Impaired LUE Proprioception: Impaired by gross assessment Coordination Gross Motor Movements are Fluid and Coordinated: No Fine Motor Movements are Fluid and Coordinated: No Coordination and Movement Description: Flaccid L UE Heel  Shin Test: unable to complete on L due to hemiparesis Motor  Motor Motor: Hemiplegia;Abnormal postural alignment and control Motor - Skilled Clinical Observations: L hemiparesis UE=LE Mobility  Transfers Sit to Stand: 3: Mod assist Stand to Sit: 3: Mod assist  Trunk/Postural Assessment  Cervical Assessment Cervical Assessment: (chin tucked) Thoracic Assessment Thoracic Assessment: (rounded shoulders) Lumbar Assessment Lumbar Assessment: (preference for posterior pelvic tilt) Postural Control Postural Control: Deficits on evaluation Righting Reactions: delayed Protective Responses: delayed Postural Limitations: decreased  Balance Balance Balance Assessed: Yes Static Sitting Balance Static Sitting - Level of Assistance: 5: Stand by assistance Dynamic Sitting Balance Dynamic Sitting - Balance Support: During functional activity Dynamic Sitting - Level of Assistance: 4:  Min assist Theatre stage manager Standing - Balance Support: During functional activity Static Standing - Level of Assistance: 3: Mod assist Dynamic Standing Balance Dynamic Standing - Balance Support: During functional activity Dynamic Standing - Level of Assistance: 2: Max assist Extremity/Trunk Assessment RUE Assessment RUE Assessment: Within Functional Limits LUE Assessment LUE Assessment: Exceptions to [facility] LUE Strength Left Shoulder Flexion: 2-/5 LUE Tone LUE Tone: Flaccid(slight shoulder avivation, but nothing below shoulder)   See Function Navigator for Current Functional Status.   Refer to Care Plan for Long Term Goals  Recommendations for other services: Neuropsych and Therapeutic Recreation  Kitchen group and Outing/community reintegration   Discharge Criteria: Patient will be discharged from OT if patient refuses treatment 3 consecutive times without medical reason, if treatment goals not met, if there is a change in medical status, if patient makes no progress towards goals or if  patient is discharged from hospital.  The above assessment, treatment plan, treatment alternatives and goals were discussed and mutually agreed upon: by patient  Valma Cava 12/19/2016, 12:45 PM

## 2016-12-19 NOTE — Progress Notes (Signed)
Patient information reviewed and entered into eRehab system by Joanthony Hamza, RN, CRRN, PPS Coordinator.  Information including medical coding and functional independence measure will be reviewed and updated through discharge.    

## 2016-12-19 NOTE — Progress Notes (Signed)
Subjective/Complaints:   Objective: Vital Signs: Blood pressure (!) 162/98, pulse 96, temperature 98.3 F (36.8 C), temperature source Oral, resp. rate 20, height 5' 3"  (1.6 m), weight 86.2 kg (190 lb), SpO2 96 %. No results found. Results for orders placed or performed during the hospital encounter of 12/18/16 (from the past 72 hour(s))  Glucose, capillary     Status: Abnormal   Collection Time: 12/18/16  4:58 PM  Result Value Ref Range   Glucose-Capillary 129 (H) 65 - 99 mg/dL  Creatinine, serum     Status: None   Collection Time: 12/18/16  6:38 PM  Result Value Ref Range   Creatinine, Ser 1.18 0.61 - 1.24 mg/dL   GFR calc non Af Amer >60 >60 mL/min   GFR calc Af Amer >60 >60 mL/min    Comment: (NOTE) The eGFR has been calculated using the CKD EPI equation. This calculation has not been validated in all clinical situations. eGFR's persistently <60 mL/min signify possible Chronic Kidney Disease.   CBC     Status: Abnormal   Collection Time: 12/18/16  6:38 PM  Result Value Ref Range   WBC 7.1 4.0 - 10.5 K/uL   RBC 5.14 4.22 - 5.81 MIL/uL   Hemoglobin 12.9 (L) 13.0 - 17.0 g/dL   HCT 39.0 39.0 - 52.0 %   MCV 75.9 (L) 78.0 - 100.0 fL   MCH 25.1 (L) 26.0 - 34.0 pg   MCHC 33.1 30.0 - 36.0 g/dL   RDW 15.0 11.5 - 15.5 %   Platelets 315 150 - 400 K/uL  CBC WITH DIFFERENTIAL     Status: Abnormal   Collection Time: 12/19/16  5:28 AM  Result Value Ref Range   WBC 8.0 4.0 - 10.5 K/uL   RBC 5.18 4.22 - 5.81 MIL/uL   Hemoglobin 13.0 13.0 - 17.0 g/dL   HCT 39.1 39.0 - 52.0 %   MCV 75.5 (L) 78.0 - 100.0 fL   MCH 25.1 (L) 26.0 - 34.0 pg   MCHC 33.2 30.0 - 36.0 g/dL   RDW 15.0 11.5 - 15.5 %   Platelets 324 150 - 400 K/uL   Neutrophils Relative % 70 %   Neutro Abs 5.6 1.7 - 7.7 K/uL   Lymphocytes Relative 19 %   Lymphs Abs 1.5 0.7 - 4.0 K/uL   Monocytes Relative 9 %   Monocytes Absolute 0.7 0.1 - 1.0 K/uL   Eosinophils Relative 2 %   Eosinophils Absolute 0.2 0.0 - 0.7 K/uL    Basophils Relative 0 %   Basophils Absolute 0.0 0.0 - 0.1 K/uL  Comprehensive metabolic panel     Status: Abnormal   Collection Time: 12/19/16  5:28 AM  Result Value Ref Range   Sodium 139 135 - 145 mmol/L   Potassium 4.1 3.5 - 5.1 mmol/L   Chloride 105 101 - 111 mmol/L   CO2 25 22 - 32 mmol/L   Glucose, Bld 129 (H) 65 - 99 mg/dL   BUN 18 6 - 20 mg/dL   Creatinine, Ser 1.17 0.61 - 1.24 mg/dL   Calcium 9.4 8.9 - 10.3 mg/dL   Total Protein 7.3 6.5 - 8.1 g/dL   Albumin 3.2 (L) 3.5 - 5.0 g/dL   AST 18 15 - 41 U/L   ALT 25 17 - 63 U/L   Alkaline Phosphatase 96 38 - 126 U/L   Total Bilirubin 0.5 0.3 - 1.2 mg/dL   GFR calc non Af Amer >60 >60 mL/min   GFR calc Af Amer >  60 >60 mL/min    Comment: (NOTE) The eGFR has been calculated using the CKD EPI equation. This calculation has not been validated in all clinical situations. eGFR's persistently <60 mL/min signify possible Chronic Kidney Disease.    Anion gap 9 5 - 15  Glucose, capillary     Status: Abnormal   Collection Time: 12/19/16  6:44 AM  Result Value Ref Range   Glucose-Capillary 128 (H) 65 - 99 mg/dL     HEENT: normal Cardio: RRR and no murmur Resp: CTA B/L and unlabored GI: BS positive and NT, ND Extremity:  No Edema Skin:   Intact Neuro: Abnormal Sensory reduce LUE, Abnormal Motor 2- left biceps and pecs 0/5 grip and Abnormal FMC Ataxic/ dec FMC Musc/Skel:  Other no pain with LUE and LLE ROM Gen NAD   Assessment/Plan: 1. Functional deficits secondary to Right Brainstem pontine and medullary infarcts which require 3+ hours per day of interdisciplinary therapy in a comprehensive inpatient rehab setting. Physiatrist is providing close team supervision and 24 hour management of active medical problems listed below. Physiatrist and rehab team continue to assess barriers to discharge/monitor patient progress toward functional and medical goals. FIM:                   Function - Comprehension Comprehension  assist level: Follows basic conversation/direction with extra time/assistive device  Function - Expression Expression: Verbal Expression assist level: Expresses basic needs/ideas: With extra time/assistive device  Function - Social Interaction Social Interaction assist level: Interacts appropriately 90% of the time - Needs monitoring or encouragement for participation or interaction.  Function - Problem Solving Problem solving assist level: Solves basic 90% of the time/requires cueing < 10% of the time  Function - Memory Patient normally able to recall (first 3 days only): Current season, That he or she is in a hospital, Staff names and faces  Medical Problem List and Plan: 1.Left side weakness/dysphagiasecondary to right paramedian brainstem infarct/pons/Medullaas well as history of calcified chronic brain mass: onset 11/30 CIR PT, OT, SLP 2. DVT Prophylaxis/Anticoagulation: Subcutaneous Lovenox. Monitor for any bleeding episodes 3. Pain Management:Tylenol as needed 4. Mood:Provide emotional support 5. Neuropsych: This patientiscapable of making decisions on hisown behalf. 6. Skin/Wound Care:Routine skin checks 7. Fluids/Electrolytes/Nutrition:Routine I&O's with follow-up chemistries 8.Hypertension. Norvasc 10 mg daily, Coreg 25 mg twice a day, Avapro 150 mg daily. Monitor with increased mobility Elevated 12/7- will increase Avapro to 347m Vitals:   12/18/16 1731 12/19/16 0500  BP: (!) 158/103 (!) 162/98  Pulse:  96  Resp:  20  Temp: 98.1 F (36.7 C) 98.3 F (36.8 C)  SpO2:  96%   9.Hyperlipidemia. Lipitor 10.Diabetes mellitus peripheral neuropathy. Hemoglobin A1c 6.2. SSI. Check blood sugars before meals and at bedtime. Patient on Janumet-XR100-1000 milligrams daily. Resume as needed CBG (last 3)  Recent Labs    12/18/16 1146 12/18/16 1658 12/19/16 0644  GLUCAP 133* 129* 128*  controlled 12/7 LOS (Days) 1 A FACE TO FACE EVALUATION WAS  PERFORMED  ACharlett Blake12/07/2016, 7:37 AM

## 2016-12-19 NOTE — Plan of Care (Signed)
Pt was continent of urine Appetite is fair able to eat independently with set up assist Skin intact

## 2016-12-19 NOTE — Progress Notes (Signed)
Nutrition Brief Note  Patient identified on the Malnutrition Screening Tool (MST) Report  Wt Readings from Last 15 Encounters:  12/18/16 190 lb (86.2 kg)  12/12/16 220 lb (99.8 kg)  07/07/16 219 lb (99.3 kg)  04/08/16 213 lb (96.6 kg)  11/24/14 195 lb 8 oz (88.7 kg)  09/20/14 192 lb 8 oz (87.3 kg)  09/04/14 193 lb (87.5 kg)  08/15/14 192 lb 3.2 oz (87.2 kg)  08/11/14 193 lb (87.5 kg)  07/23/14 201 lb 11.5 oz (91.5 kg)  02/01/14 224 lb 6.4 oz (101.8 kg)    Body mass index is 33.66 kg/m. Patient meets criteria for class I obesity based on current BMI.   Current diet order is carbohydrate modified, patient is consuming approximately 100% of meals at this time. Pt reports having a good appetite currently and PTA with no other difficulties. Labs and medications reviewed.   No nutrition interventions warranted at this time. If nutrition issues arise, please consult RD.   Corrin Parker, MS, RD, LDN Pager # [phone] After hours/ weekend pager # [phone]

## 2016-12-19 NOTE — Evaluation (Signed)
Speech Language Pathology Assessment and Plan  Patient Details  Name: [name] MRN: [id number] Date of Birth: Dec 18, 1965  SLP Diagnosis: Dysarthria  Rehab Potential:   ELOS: 1 week for SLP    Today's Date: 12/19/2016 SLP Individual Time: 1000-1055 SLP Individual Time Calculation (min): 55 min   Problem List:  Patient Active Problem List   Diagnosis Date Noted  . Right pontine cerebrovascular accident ([place]) 12/18/2016  . Left hemiparesis ([place])   . Diastolic dysfunction   . Dysphagia, post-stroke   . AKI (acute kidney injury) ([place])   . Prediabetes   . Acute ischemic stroke ([place]) 12/12/2016  . Acute respiratory failure with hypercapnia ([place])   . Seizures ([place]) 07/22/2014  . Hypertension 07/22/2014  . Hyperglycemia 07/22/2014  . Type 2 diabetes mellitus with vascular disease ([place]) 07/22/2014  . Pyrexia   . Brain mass   . Malignant neoplasm of rectum ([place]) 09/02/2007   Past Medical History:  Past Medical History:  Diagnosis Date  . Colon cancer ([place])    rectal  . Diabetes mellitus without complication ([place])   . Hypertension   . Seizure [facility])    Past Surgical History:  Past Surgical History:  Procedure Laterality Date  . APPENDECTOMY    . JOINT REPLACEMENT Left 2004   Left hip  . TOTAL HIP ARTHROPLASTY      Assessment / Plan / Recommendation Clinical Impression Patient is a 51 y.o.right handed malewith history of hypertension, diabetes mellitus and a chroniccalcifiedbrain mass likely meningioma. Presented 12/12/2016 with left-sided weakness. Noted blood pressure 240/140. Patient had reported he recently ran out of his clonidine and ARB a few months agowith financial constraints. Cranial CT scan reviewed, unremarkable for acute process. Per report, stable right medial frontal region calcified mass in vasogenic edema right frontal lobe. Stable bilateral anterior frontal lobe encephalomalacia. Patient did not receive TPA. CT angiogram head and neck negative  for larger proximal arterial branch occlusion. MRI showed acute right paramedian brainstem infarct affecting lower pons and upper medulla. Heavy calcified mass probably intra-axial involves the medial right hemisphere slightly increased from 2016. Echocardiogram with ejection fraction of 92% grade 1 diastolic dysfunction no source of emboli. Maintained on aspirin for CVA prophylaxis. Subcutaneous Lovenox for DVT prophylaxis.Initially onMechanical soft nectar thick liquid dietwith follow-up swallow study and diet advanced to a regular consistency with thin liquids. Patient transferred to CIR on 12/18/2016.  Patient's cognitive-linguistic function appeared [facility] for all tasks assessed. Patient demonstrated mild dysarthria characterized by low vocal intensity and imprecise consonants secondary to motor weakness, however, patient is ~100% intelligible but patient reports current speech intelligibility impacts his overall function, especially while talking on the phone. Patient would benefit from skilled SLP intervention to maximize his speech intelligibility and overall functional communication.     Skilled Therapeutic Interventions          Administered a cognitive-linguistic evaluation. Please see above for details. Patient educated on speech intelligibility strategies and utilized strategies at the phrase level during a structured verbal task with intermittent supervision verbal cues. Patient left upright in wheelchair with all needs within reach. Continue with current plan of care.    SLP Assessment  Patient will need skilled [facility] [facility] during CIR admission    Recommendations  Patient destination: Home Follow up Recommendations: None Equipment Recommended: None recommended by SLP    SLP Frequency 3 to 5 out of 7 days   SLP Duration  SLP Intensity  SLP Treatment/Interventions 1 week for SLP  Minumum  of 1-2 x/day, 30 to 90 minutes  Environmental  controls;Internal/external aids;Cueing hierarchy;Functional tasks;Patient/family education;Therapeutic Activities;Speech/Language facilitation    Pain Pain Assessment Pain Assessment: No/denies pain Pain Score: 0-No pain  Prior Functioning Type of Home: House  Lives With: Spouse;Daughter Available Help at Discharge: Family;Available PRN/intermittently 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  Problem Solving Problem solving assist level: Solves complex problems: Recognizes & self-corrects  Memory Memory assist level: More than reasonable amount of time   Short Term Goals: Week 1: SLP Short Term Goal 1 (Week 1): STGs=LTGs  Refer to Care Plan for Long Term Goals  Recommendations for other services: None   Discharge Criteria: Patient will be discharged from SLP if patient refuses treatment 3 consecutive times without medical reason, if treatment goals not met, if there is a change in medical status, if patient makes no progress towards goals or if patient is discharged from hospital.  The above assessment, treatment plan, treatment alternatives and goals were discussed and mutually agreed upon: by patient  Delaila Nand 12/19/2016, 3:36 PM

## 2016-12-19 NOTE — Progress Notes (Signed)
Physical Therapy Session Note  Patient Details  Name: [name] MRN: [id number] Date of Birth: 05-18-1965  Today's Date: 12/19/2016 PT Individual Time: 1415-1445 PT Individual Time Calculation (min): 30 min   Short Term Goals: Week 1:  PT Short Term Goal 1 (Week 1): pt will transfer with mod assist consistently PT Short Term Goal 2 (Week 1): Pt will initiate gait training with RW PT Short Term Goal 3 (Week 1): pt will demonstrate dynamic sitting balance with supervision PT Short Term Goal 4 (Week 1): Pt will demo bed mobility with min assist  Skilled Therapeutic Interventions/Progress Updates:  No c/o pain.  Session focus on LUE NMR and positioning in w/c.  Pt requires min assist to transition to EOB.  Mod for stand/pivot to w/c on R.  LUE NMR with UE ranger 2 trials for shoulder flexion/extension to fatigue.  Pt returned to room at end of session, positioned back in bed (mod for transfer and sit>supine).  Positioned to comfort with call bell in reach and needs met.   Therapy Documentation Precautions:  Precautions Precautions: Fall Restrictions Weight Bearing Restrictions: No   See Function Navigator for Current Functional Status.   Therapy/Group: Individual Therapy  [name] 12/19/2016, 4:11 PM

## 2016-12-19 NOTE — Progress Notes (Signed)
Social Work Assessment and Plan Social Work Assessment and Plan  Patient Details  Name: [name] MRN: [id number] Date of Birth: May 29, 1965  Today's Date: 12/19/2016  Problem List:  Patient Active Problem List   Diagnosis Date Noted  . Right pontine cerebrovascular accident ([place]) 12/18/2016  . Left hemiparesis ([place])   . Diastolic dysfunction   . Dysphagia, post-stroke   . AKI (acute kidney injury) ([place])   . Prediabetes   . Acute ischemic stroke ([place]) 12/12/2016  . Acute respiratory failure with hypercapnia ([place])   . Seizures ([place]) 07/22/2014  . Hypertension 07/22/2014  . Hyperglycemia 07/22/2014  . Type 2 diabetes mellitus with vascular disease ([place]) 07/22/2014  . Pyrexia   . Brain mass   . Malignant neoplasm of rectum ([place]) 09/02/2007   Past Medical History:  Past Medical History:  Diagnosis Date  . Colon cancer ([place])    rectal  . Diabetes mellitus without complication ([place])   . Hypertension   . Seizure [facility])    Past Surgical History:  Past Surgical History:  Procedure Laterality Date  . APPENDECTOMY    . JOINT REPLACEMENT Left 2004   Left hip  . TOTAL HIP ARTHROPLASTY     Social History:  reports that  has never smoked. he has never used smokeless tobacco. He reports that he drinks alcohol. He reports that he does not use drugs.  Family / Support Systems Marital Status: Married Patient Roles: Spouse, Parent, Other (Comment)(employee) Spouse/Significant Other: [name] [phone] Children: 44 yo daughter Other Supports: Friends Anticipated Caregiver: Wife and daughter Ability/Limitations of Caregiver: Wife works M-F 8-5 pm and daughter is in school Caregiver Availability: Evenings only Family Dynamics: Close knit family who depend upon one another. Small family unit who will do for one another. Pt reports they have been tested recently but he is grateful for them. He is hopeful he will do well here and will work hard while here.  Social  History Preferred language: English Religion: Christian Cultural Background: No issues Education: High School Read: Yes Write: Yes Employment Status: Employed Name of Employer: [employer] part time Return to Work Plans: Unsure depends upon his recovery from this stroke Legal Hisotry/Current Legal Issues: No issues Guardian/Conservator: None-according to MD pt is capable of making his own decisions while here.    Abuse/Neglect Abuse/Neglect Assessment Can Be Completed: Yes Physical Abuse: Denies Verbal Abuse: Denies Sexual Abuse: Denies Exploitation of patient/patient's resources: Denies Self-Neglect: Denies  Emotional Status Pt's affect, behavior adn adjustment status: Pt is trying to be positive and can see some progress which is encouraging to him. He does not want to burden his wife and will do whatever is needed to get well and help she and their daughter. He has always been independent and cared for himself. Recent Psychosocial Issues: loss job and could not afford medicines  Pyschiatric History: No history deferred depression screening due to still adjusting to the new unit and is able to verbalize his concerns and feelings. With his young age do feel he would benefit from seeing neuro-psych while here will make referral. Substance Abuse History: No issues  Patient / Family Perceptions, Expectations & Goals Pt/Family understanding of illness & functional limitations: Pt and wife can explain his stroke and deficits. Both have spoken with the MD's and feel they have a good understanding of his condition and treatment plan going forward from here. Both extremely pleased he could come to rehab. Premorbid pt/family roles/activities: Husband, father, employee, freind, etc Anticipated changes in roles/activities/participation: resume  Pt/family expectations/goals: Pt states: " I want to be able to do for myself before I leave here." Wife states: " I am hopeful he will do well I need him  to be safe at home while I work."  [employer]: None Premorbid Home Care/DME Agencies: Other (Comment)(has equipment from past hip surgery) Transportation available at discharge: Wife Resource referrals recommended: Neuropsychology, Support group (specify)  Discharge Planning Living Arrangements: Spouse/significant other, Children Support Systems: Spouse/significant other, Children, Friends/neighbors Type of Residence: Private residence Insurance Resources: Self-pay(To apply for [employer]) Financial Resources: Employment, Secondary school teacher Screen Referred: Yes Living Expenses: Rent Money Management: Spouse Does the patient have any problems obtaining your medications?: Yes (Describe)(uninsured couldnot afford his medicines) Home Management: Wife and pt both helped one another Patient/Family Preliminary Plans: Hope to return home but would need to be mod/i wheelchair level at least to be safe while his wife and daughter are gone during the day. Will await therapy team evaluation and work on a safe plan for him. Will have wife apply for disability and medicaid while here to begin process. Will see if need to connect pt with [facility] for assist with medicines and medical follow up.  Sw Barriers to Discharge: Decreased caregiver support Sw Barriers to Discharge Comments: Does not have 24 hr care-wife works days Social Work Anticipated Follow Up Needs: HH/OP, Support Group, SNF  Clinical Impression Pleasant gentleman who is motivated and willing to do whatever he needs to do for his family. He is hopeful he can get mod/i to be able to return home and not go to a NH when discharged from here. Wife to apply for disability and medicaid while here. Have contacted [facility] counselor. Will also make referral for neuro-psych to see him while here for coping and support. Await therapy team's evaluations.  Elease Hashimoto 12/19/2016, 1:22 PM

## 2016-12-19 NOTE — Progress Notes (Signed)
   12/19/16 1200  What Happened  Was fall witnessed? Yes  Who witnessed fall? Charnika Herbst RN, Amy PT, Royal PT   Patients activity before fall ambulating-assisted  Point of contact buttocks  Was patient injured? No  Follow Up  MD notified Gerarda Gunther PA   Time MD notified 79  Family notified No- patient refusal  Additional tests No  Simple treatment Other (comment) (none)  Progress note created (see row info) Yes  Adult Fall Risk Assessment  Risk Factor Category (scoring not indicated) Fall has occurred during this admission (document High fall risk)  Patient's Fall Risk High Fall Risk (>13 points)  Adult Fall Risk Interventions  Required Bundle Interventions *See Row Information* High fall risk - low, moderate, and high requirements implemented  Additional Interventions PT/OT need assessed if change in mobility from baseline;Use of appropriate toileting equipment (bedpan, BSC, etc.)  Screening for Fall Injury Risk  Risk For Fall Injury- See Row Information  None identified  Vitals  Temp 98.4 F (36.9 C)  Temp Source Oral  BP 130/84  BP Location Right Arm  BP Method Automatic  Patient Position (if appropriate) Sitting  Pulse Rate 89  Pulse Rate Source Dinamap  Oxygen Therapy  SpO2 98 %  O2 Device Room Air  Pain Assessment  Pain Assessment No/denies pain  Pain Score 0  Neurological  Neuro (WDL) X  Level of Consciousness Alert  Orientation Level Oriented X4  Cognition Appropriate at baseline  Speech Clear  R Hand Grip Strong  L Hand Grip Absent   RUE Motor Response Purposeful movement  RUE Sensation Full sensation  RUE Motor Strength 5  LUE Motor Response Non-purposeful movement  LUE Sensation Decreased  LUE Motor Strength 1  RLE Motor Response Purposeful movement  RLE Sensation Full sensation  RLE Motor Strength 5  LLE Motor Response Non-purposeful movement  LLE Sensation Decreased  LLE Motor Strength 2  Neuro Symptoms None  Musculoskeletal   Musculoskeletal (WDL) X  Assistive Device Stedy  Generalized Weakness Yes  Weight Bearing Restrictions No  Integumentary  Integumentary (WDL) WDL  Skin Color Appropriate for ethnicity  Skin Condition Dry  Skin Integrity Intact  Skin Turgor Non-tenting    Pt was attempting to stand with PT in hallway when pt slid from wheelchair to floor. PT assisted pt to ground and pt was helped to his wheelchair by several staff members. Pt's vitals are stable and no injuries were sustained. Pt complains of no pain and was left in room with call light in place eating his lunch.

## 2016-12-19 NOTE — IPOC Note (Signed)
Overall Plan of Care [facility]) Patient Details Name: [name] MRN: [id number] DOB: [birth year]  Admitting Diagnosis: Left hemiparesis [facility])  Hospital Problems: Principal Problem:   Left hemiparesis ([place]) Active Problems:   Type 2 diabetes mellitus with vascular disease ([place])   Right pontine cerebrovascular accident [facility])     Functional Problem List: Nursing Safety, Nutrition  PT Balance, Endurance, Motor, Sensory, Safety, Perception  OT Balance, Endurance, Motor, Vision, Sensory  SLP    TR         Basic ADL's: OT Eating, Bathing, Grooming, Dressing, Toileting     Advanced  ADL's: OT       Transfers: PT Bed Mobility, Bed to Chair, Car, Floor  OT Toilet, Tub/Shower     Locomotion: PT Ambulation, Emergency planning/management officer, Stairs     Additional Impairments: OT Fuctional Use of Upper Extremity  SLP        TR      Anticipated Outcomes Item Anticipated Outcome  Self Feeding Mod I  Swallowing      Basic self-care  Mod I  Toileting  Supevision    Bathroom Transfers Supervision  Bowel/Bladder  will be toileted every two hours  Transfers  mod I w/c level  Locomotion  mod I w/c level, min assist ambulatory  Communication     Cognition     Pain  discomfort will remain at a tolerable level  Safety/Judgment  will remain safe while in IP Rehab   Therapy Plan: PT Intensity: Minimum of 1-2 x/day ,45 to 90 minutes PT Frequency: 5 out of 7 days PT Duration Estimated Length of Stay: 21-25 days OT Intensity: Minimum of 1-2 x/day, 45 to 90 minutes OT Frequency: 5 out of 7 days OT Duration/Estimated Length of Stay: 24-28 days      Team Interventions: Nursing Interventions Patient/Family Education, Pain Management, Discharge Planning, Psychosocial Support  PT interventions Ambulation/gait training, Community reintegration, DME/adaptive equipment instruction, Neuromuscular re-education, Psychosocial support, Stair training, UE/LE Strength taining/ROM, Wheelchair  propulsion/positioning, UE/LE Coordination activities, Therapeutic Activities, Functional electrical stimulation, Discharge planning, Training and development officer, Cognitive remediation/compensation, Disease management/prevention, Functional mobility training, Patient/family education, Splinting/orthotics, Therapeutic Exercise, Visual/perceptual remediation/compensation  OT Interventions Training and development officer, Community reintegration, Discharge planning, DME/adaptive equipment instruction, Functional electrical stimulation, Functional mobility training, Neuromuscular re-education, Patient/family education, Psychosocial support, Self Care/advanced ADL retraining, Therapeutic Activities, Therapeutic Exercise, UE/LE Strength taining/ROM, Visual/perceptual remediation/compensation, UE/LE Coordination activities  SLP Interventions    TR Interventions    SW/CM Interventions Discharge Planning, Psychosocial Support, Patient/Family Education   Barriers to Discharge MD  Medical stability and Lack of/limited family support  Nursing Decreased caregiver support, Medication compliance wife works unable to do 24 hour care  PT Decreased caregiver support, Inaccessible home environment    OT      SLP      SW Decreased caregiver support Does not have 24 hr care-wife works days   Team Discharge Planning: Destination: PT-Home ,OT- Home , SLP-  Projected Follow-up: PT-Home health PT, OT-  Home health OT, SLP-  Projected Equipment Needs: PT-To be determined, OT- Tub/shower bench, SLP-  Equipment Details: PT- , OT-  Patient/family involved in discharge planning: PT- Patient,  OT-Patient, SLP-   MD ELOS: 18-21d Medical Rehab Prognosis:  Excellent Assessment:  Mark Lites Whitakeris a 52 y.o.right handed malewith history of hypertension, diabetes mellitus and a chroniccalcifiedbrain mass likely meningioma. Per chart reviewand patient, patientlives with spouse. Independent prior to admission. Works at  [employer] part time. One level home to steps to entry.Wife works during the day.Presented  12/12/2016 with left-sided weakness. Noted blood pressure 240/140. Patient had reported he recently ran out of his clonidine and ARB a few months agowith financial constraints. Cranial CT scan reviewed, unremarkable for acute process. Per report, stable right medial frontal region calcified mass in vasogenic edema right frontal lobe. Stable bilateral anterior frontal lobe encephalomalacia. Patient did not receive TPA. CT angiogram head and neck negative for larger proximal arterial branch occlusion. MRI showed acute right paramedian brainstem infarct affecting lower pons and upper medulla. Heavy calcified mass probably intra-axial involves the medial right hemisphere slightly increased from 2016. Echocardiogram with ejection fraction of 37% grade 1 diastolic dysfunction no source of emboli. Maintained on aspirin for CVA prophylaxis. Subcutaneous Lovenox for DVT prophylaxis.Initially onMechanical soft nectar thick liquid dietwith follow-up swallow study and diet advanced to a regular consistency with thin liquids. Physical and occupational therapy evaluations completed with recommendations of physical medicine rehabilitation consult.Patient was admitted for a comprehensive rehabilitation program   Now requiring 24/7 Rehab RN,MD, as well as CIR level PT, OT and SLP.  Treatment team will focus on ADLs and mobility with goals set at SUp See Team Conference Notes for weekly updates to the plan of care

## 2016-12-19 NOTE — Evaluation (Signed)
Physical Therapy Assessment and Plan  Patient Details  Name: [name] MRN: [id number] Date of Birth: [birth year]  PT Diagnosis: Abnormality of gait, Coordination disorder, Hemiparesis non-dominant and Muscle weakness Rehab Potential: Good ELOS: 21-25 days   Today's Date: 12/19/2016 PT Individual Time: 1100-1200 PT Individual Time Calculation (min): 60 min    Problem List:  Patient Active Problem List   Diagnosis Date Noted  . Right pontine cerebrovascular accident ([place]) 12/18/2016  . Left hemiparesis ([place])   . Diastolic dysfunction   . Dysphagia, post-stroke   . AKI (acute kidney injury) ([place])   . Prediabetes   . Acute ischemic stroke ([place]) 12/12/2016  . Acute respiratory failure with hypercapnia ([place])   . Seizures ([place]) 07/22/2014  . Hypertension 07/22/2014  . Hyperglycemia 07/22/2014  . Type 2 diabetes mellitus with vascular disease ([place]) 07/22/2014  . Pyrexia   . Brain mass   . Malignant neoplasm of rectum ([place]) 09/02/2007    Past Medical History:  Past Medical History:  Diagnosis Date  . Colon cancer ([place])    rectal  . Diabetes mellitus without complication ([place])   . Hypertension   . Seizure [facility])    Past Surgical History:  Past Surgical History:  Procedure Laterality Date  . APPENDECTOMY    . JOINT REPLACEMENT Left 2004   Left hip  . TOTAL HIP ARTHROPLASTY      Assessment & Plan Clinical Impression: [name] is a 51 y.o. right handed male with history of hypertension, diabetes mellitus and a chronic calcified brain mass likely meningioma.  Presented 12/12/2016 with left-sided weakness. Noted blood pressure 240/140. Patient had reported he recently ran out of his clonidine and ARB a few months ago with financial constraints. Cranial CT scan reviewed, unremarkable for acute process. Per report, stable right medial frontal region calcified mass in vasogenic edema right frontal lobe. Stable bilateral anterior frontal lobe encephalomalacia. Patient  did not receive TPA. CT angiogram head and neck negative for larger proximal arterial branch occlusion. MRI showed acute right paramedian brainstem infarct affecting lower pons and upper medulla. Heavy calcified mass probably intra-axial involves the medial right hemisphere slightly increased from 2016. Echocardiogram with ejection fraction of 08% grade 1 diastolic dysfunction no source of emboli. Maintained on aspirin for CVA prophylaxis. Subcutaneous Lovenox for DVT prophylaxis. Initially on Mechanical soft nectar thick liquid diet with follow-up swallow study and diet advanced to a regular consistency with thin liquids.  Patient transferred to CIR on 12/18/2016 .   Patient currently requires max to total with mobility secondary to muscle weakness, impaired timing and sequencing, abnormal tone, unbalanced muscle activation and decreased coordination, decreased attention to left and decreased motor planning and decreased sitting balance, decreased standing balance, decreased postural control, hemiplegia and decreased balance strategies.  Prior to hospitalization, patient was independent  with mobility and lived with Spouse, Daughter in a House home.  Home access is 2Stairs to enter.  Patient will benefit from skilled PT intervention to maximize safe functional mobility, minimize fall risk and decrease caregiver burden for planned discharge home with intermittent assist.  Anticipate patient will benefit from follow up [facility] at discharge.  PT - End of Session Activity Tolerance: Tolerates 30+ min activity with multiple rests Endurance Deficit: Yes PT Assessment Rehab Potential (ACUTE/IP ONLY): Good PT Barriers to Discharge: Decreased caregiver support;Inaccessible home environment PT Patient demonstrates impairments in the following area(s): Balance;Endurance;Motor;Sensory;Safety;Perception PT Transfers Functional Problem(s): Bed Mobility;Bed to Chair;Car;Floor PT Locomotion Functional Problem(s):  Ambulation;Wheelchair Mobility;Stairs PT Plan PT  Intensity: Minimum of 1-2 x/day ,45 to 90 minutes PT Frequency: 5 out of 7 days PT Duration Estimated Length of Stay: 21-25 days PT Treatment/Interventions: Ambulation/gait training;Community reintegration;DME/adaptive equipment instruction;Neuromuscular re-education;Psychosocial support;Stair training;UE/LE Strength taining/ROM;Wheelchair propulsion/positioning;UE/LE Coordination activities;Therapeutic Activities;Functional electrical stimulation;Discharge planning;Balance/vestibular training;Cognitive remediation/compensation;Disease management/prevention;Functional mobility training;Patient/family education;Splinting/orthotics;Therapeutic Exercise;Visual/perceptual remediation/compensation PT Transfers Anticipated Outcome(s): mod I w/c level PT Locomotion Anticipated Outcome(s): mod I w/c level, min assist ambulatory PT Recommendation Recommendations for Other Services: Therapeutic Recreation consult Therapeutic Recreation Interventions: Outing/community reintergration;Pet therapy Follow Up Recommendations: Home health PT Patient destination: Home Equipment Recommended: To be determined  Skilled Therapeutic Intervention No c/o pain.  Session focus on initial PT evaluation below, and education on role of PT, goals of therapy, rehab potential, and safety plan.  Pt instructed pt in gait, transfers, and w/c mobility.  While performing w/c mobility, pt slid towards end of chair, attempted to reposition hip back into chair, but pt's pants slick and required controlled lower from w/c to floor.  Returned to chair with +2 assist, vitals WNL, RN aware.  Returned to room end of session, positioned in w/c with call bell in reach and needs met.  Rn present when PT exiting.   PT Evaluation Precautions/Restrictions Precautions Precautions: Fall Restrictions Weight Bearing Restrictions: No  Pain Pain Assessment Pain Assessment: No/denies pain Pain Score:  0-No pain Home Living/Prior Functioning Home Living Available Help at Discharge: Family;Available PRN/intermittently Type of Home: House Home Access: Stairs to enter [employer] of Steps: 2 Entrance Stairs-Rails: None Home Layout: One level  Lives With: Spouse;Daughter Prior Function Level of Independence: Independent with transfers;Independent with gait  Able to Take Stairs?: Yes Driving: No Vision/Perception  Perception Perception: Within Functional Limits Praxis Praxis: Impaired Praxis Impairment Details: Motor planning  Cognition Overall Cognitive Status: Within Functional Limits for tasks assessed Arousal/Alertness: Awake/alert Orientation Level: Oriented X4 Selective Attention: Appears intact Memory: Appears intact Awareness: Appears intact Problem Solving: Impaired Safety/Judgment: Appears intact Sensation Sensation Light Touch: Impaired by gross assessment(BLEs, reports decreased to LT on L) Coordination Gross Motor Movements are Fluid and Coordinated: No Fine Motor Movements are Fluid and Coordinated: No Heel Shin Test: unable to complete on L due to hemiparesis Motor  Motor Motor: Hemiplegia;Abnormal postural alignment and control Motor - Skilled Clinical Observations: L hemiparesis UE=LE  Mobility Transfers Transfers: Yes Sit to Stand: 3: Mod assist Stand to Sit: 3: Mod assist Stand Pivot Transfers: 2: Max assist Locomotion  Ambulation Ambulation: Yes Ambulation/Gait Assistance: 2: Max assist Ambulation Distance (Feet): 30 Feet Assistive device: (rail in hallway) Stairs / Additional Locomotion Stairs: Yes Stairs Assistance: 2: Max assist Stair Management Technique: One rail Right Number of Stairs: 1 Height of Stairs: 3 Product manager Mobility: Yes Wheelchair Assistance: 5: Careers information officer: Right upper extremity;Right lower extremity Wheelchair Parts Management: Needs assistance Distance: 100   Trunk/Postural Assessment  Cervical Assessment Cervical Assessment: (chin tucked) Thoracic Assessment Thoracic Assessment: (rounded shoulders) Lumbar Assessment Lumbar Assessment: (preference for posterior pelvic tilt) Postural Control Postural Control: Deficits on evaluation Righting Reactions: delayed Protective Responses: delayed Postural Limitations: decreased  Balance Balance Balance Assessed: Yes Static Sitting Balance Static Sitting - Level of Assistance: 5: Stand by assistance Static Standing Balance Static Standing - Balance Support: Right upper extremity supported;During functional activity Static Standing - Level of Assistance: 3: Mod assist Dynamic Standing Balance Dynamic Standing - Balance Support: Right upper extremity supported;During functional activity Dynamic Standing - Level of Assistance: 2: Max assist Extremity Assessment      RLE Assessment RLE Assessment: Within  Functional Limits(5/5 proximal to distal) LLE Assessment LLE Assessment: Exceptions to [facility] LLE Strength Left Hip Flexion: 3-/5 Left Knee Flexion: 1/5 Left Knee Extension: 3-/5 Left Ankle Dorsiflexion: 0/5 Left Ankle Plantar Flexion: 0/5   See Function Navigator for Current Functional Status.   Refer to Care Plan for Long Term Goals  Recommendations for other services: Therapeutic Recreation  Pet therapy and Outing/community reintegration  Discharge Criteria: Patient will be discharged from PT if patient refuses treatment 3 consecutive times without medical reason, if treatment goals not met, if there is a change in medical status, if patient makes no progress towards goals or if patient is discharged from hospital.  The above assessment, treatment plan, treatment alternatives and goals were discussed and mutually agreed upon: by patient  [name] 12/19/2016, 12:24 PM

## 2016-12-19 NOTE — Care Management Note (Signed)
Inpatient [facility] Individual Statement of Services  Patient Name:  [name]  Date:  12/19/2016  Welcome to the [facility].  Our goal is to provide you with an individualized program based on your diagnosis and situation, designed to meet your specific needs.  With this comprehensive rehabilitation program, you will be expected to participate in at least 3 hours of rehabilitation therapies Monday-Friday, with modified therapy programming on the weekends.  Your rehabilitation program will include the following services:  Physical Therapy (PT), Occupational Therapy (OT), Speech Therapy (ST), 24 hour per day rehabilitation nursing, Therapeutic Recreaction (TR), Neuropsychology, Case Management (Social Worker), Rehabilitation Medicine, Nutrition Services and Pharmacy Services  Weekly team conferences will be held on Wednesday to discuss your progress.  Your Social Worker will talk with you frequently to get your input and to update you on team discussions.  Team conferences with you and your family in attendance may also be held.  Expected length of stay: 23-28 days Overall anticipated outcome: mod/i wheelchair level and min assist with ambulation  Depending on your progress and recovery, your program may change. Your Social Worker will coordinate services and will keep you informed of any changes. Your Social Worker's name and contact numbers are listed  below.  The following services may also be recommended but are not provided by the [facility] will be made to provide these services after discharge if needed.  Arrangements include referral to agencies that provide these services.  Your insurance has been verified to be:  Will apply for Medicaid Your primary doctor is:  Glendale Chard  Pertinent information will be shared with your doctor and your insurance company.  Social Worker:  Ovidio Kin, [place] or ([phone]  Information discussed with and copy given to patient by: Elease Hashimoto, 12/19/2016, 10:06 AM

## 2016-12-20 ENCOUNTER — Inpatient Hospital Stay (HOSPITAL_COMMUNITY): Payer: Self-pay | Admitting: Physical Therapy

## 2016-12-20 ENCOUNTER — Inpatient Hospital Stay (HOSPITAL_COMMUNITY): Payer: Medicaid Other | Admitting: Speech Pathology

## 2016-12-20 ENCOUNTER — Inpatient Hospital Stay (HOSPITAL_COMMUNITY): Payer: Self-pay

## 2016-12-20 LAB — GLUCOSE, CAPILLARY
Glucose-Capillary: 117 mg/dL — ABNORMAL HIGH (ref 65–99)
Glucose-Capillary: 115 mg/dL — ABNORMAL HIGH (ref 65–99)
Glucose-Capillary: 112 mg/dL — ABNORMAL HIGH (ref 65–99)
Glucose-Capillary: 175 mg/dL — ABNORMAL HIGH (ref 65–99)

## 2016-12-20 NOTE — Progress Notes (Signed)
Occupational Therapy Session Note  Patient Details  Name: [name] MRN: [id number] Date of Birth: 11/27/1965  Today's Date: 12/20/2016 OT Individual Time: 1000-1100 OT Individual Time Calculation (min): 60 min    Short Term Goals: Week 1:  OT Short Term Goal 1 (Week 1): Pt will don shirt using hemi-techniques with min A OT Short Term Goal 2 (Week 1): Pt will demonstrate proficiency with self ROM of LUE with min cues or less. OT Short Term Goal 3 (Week 1): Pt will complete squat-pivot toilet transfer with mod A  Skilled Therapeutic Interventions/Progress Updates:    1:1. Upon entering room RN and NT present with pt requesting to toilet. NT stays in room to provide A for trnasfer to toilet. Pt squat pivot transfer throughout session recliner<>BSC<>w/c>EOB with L knee facilitation and Vc for anterior trunk flexion/sequencing. Pt voids bladder on toilet with increased time. Pt grooms at sink seated in w/c with increased time to locate items on L. Pt propels w/c part way to/from all tx destination with hemi technique and intermittent min A for steering. Pt completes 1x15 shoulder flex/ext, horizontal ab/adduct, circles, and elbow flex/ex with LUE in arm skate with up to MOD A to achieve full ROM. Exited session with pt semi reclined in bed with call light in reach and all needs met.  Therapy Documentation Precautions:  Precautions Precautions: Fall Restrictions Weight Bearing Restrictions: No  See Function Navigator for Current Functional Status.   Therapy/Group: Individual Therapy  Stephanie M Schlosser 12/20/2016, 10:22 AM  

## 2016-12-20 NOTE — Progress Notes (Signed)
Physical Therapy Session Note  Patient Details  Name: [name] MRN: [id number] Date of Birth: [birth year]  Today's Date: 12/20/2016 PT Individual Time: 1515-1600 PT Individual Time Calculation (min): 45 min   Short Term Goals: Week 1:  PT Short Term Goal 1 (Week 1): pt will transfer with mod assist consistently PT Short Term Goal 2 (Week 1): Pt will initiate gait training with RW PT Short Term Goal 3 (Week 1): pt will demonstrate dynamic sitting balance with supervision PT Short Term Goal 4 (Week 1): Pt will demo bed mobility with min assist   Skilled Therapeutic Interventions/Progress Updates:   Pt received supine in bed and agreeable to PT. Supine>sit transfer with min assist and cues sitting balance EOB x 3 min with supervision-min assist to assist mild LOB to the L.   Transfers training instructed by PT for sit<>stand and stand pivot with min-mod assist with PT to stabilize the LLE. Moderate cues from PT to proper sequencing UE placement.   Nustep reciprocal movement training with LLE Support from to maintain neutral Hip Abduction/External rotation.   Standing tolerance instructed by PT  X 3 minutes with intermittent stabilization of the LLE with mod assist overall  Pt returned to room and performed stand pivot transfer to bed with mod assist. Sit>supine completed with mod assist and left supine in bed with call bell in reach and all needs met.         Therapy Documentation Precautions:  Precautions Precautions: Fall Restrictions Weight Bearing Restrictions: No Vital Signs: Therapy Vitals Temp: 98.4 F (36.9 C) Temp Source: Oral Pulse Rate: 89 Resp: 18 BP: 119/72 Patient Position (if appropriate): Lying Oxygen Therapy SpO2: 97 % O2 Device: Not Delivered  Pain: 0/10    See Function Navigator for Current Functional Status.   Therapy/Group: Individual Therapy  Lorie Phenix 12/20/2016, 4:54 PM

## 2016-12-20 NOTE — Progress Notes (Signed)
Subjective/Complaints: No issues overnite  ROS- neg CP/SOB, N/V/D  Objective: Vital Signs: Blood pressure 138/80, pulse 87, temperature 97.9 F (36.6 C), temperature source Oral, resp. rate 18, height 5' 3"  (1.6 m), weight 86.2 kg (190 lb), SpO2 97 %. No results found. Results for orders placed or performed during the hospital encounter of 12/18/16 (from the past 72 hour(s))  Glucose, capillary     Status: Abnormal   Collection Time: 12/18/16  4:58 PM  Result Value Ref Range   Glucose-Capillary 129 (H) 65 - 99 mg/dL  Creatinine, serum     Status: None   Collection Time: 12/18/16  6:38 PM  Result Value Ref Range   Creatinine, Ser 1.18 0.61 - 1.24 mg/dL   GFR calc non Af Amer >60 >60 mL/min   GFR calc Af Amer >60 >60 mL/min    Comment: (NOTE) The eGFR has been calculated using the CKD EPI equation. This calculation has not been validated in all clinical situations. eGFR's persistently <60 mL/min signify possible Chronic Kidney Disease.   CBC     Status: Abnormal   Collection Time: 12/18/16  6:38 PM  Result Value Ref Range   WBC 7.1 4.0 - 10.5 K/uL   RBC 5.14 4.22 - 5.81 MIL/uL   Hemoglobin 12.9 (L) 13.0 - 17.0 g/dL   HCT 39.0 39.0 - 52.0 %   MCV 75.9 (L) 78.0 - 100.0 fL   MCH 25.1 (L) 26.0 - 34.0 pg   MCHC 33.1 30.0 - 36.0 g/dL   RDW 15.0 11.5 - 15.5 %   Platelets 315 150 - 400 K/uL  CBC WITH DIFFERENTIAL     Status: Abnormal   Collection Time: 12/19/16  5:28 AM  Result Value Ref Range   WBC 8.0 4.0 - 10.5 K/uL   RBC 5.18 4.22 - 5.81 MIL/uL   Hemoglobin 13.0 13.0 - 17.0 g/dL   HCT 39.1 39.0 - 52.0 %   MCV 75.5 (L) 78.0 - 100.0 fL   MCH 25.1 (L) 26.0 - 34.0 pg   MCHC 33.2 30.0 - 36.0 g/dL   RDW 15.0 11.5 - 15.5 %   Platelets 324 150 - 400 K/uL   Neutrophils Relative % 70 %   Neutro Abs 5.6 1.7 - 7.7 K/uL   Lymphocytes Relative 19 %   Lymphs Abs 1.5 0.7 - 4.0 K/uL   Monocytes Relative 9 %   Monocytes Absolute 0.7 0.1 - 1.0 K/uL   Eosinophils Relative 2 %    Eosinophils Absolute 0.2 0.0 - 0.7 K/uL   Basophils Relative 0 %   Basophils Absolute 0.0 0.0 - 0.1 K/uL  Comprehensive metabolic panel     Status: Abnormal   Collection Time: 12/19/16  5:28 AM  Result Value Ref Range   Sodium 139 135 - 145 mmol/L   Potassium 4.1 3.5 - 5.1 mmol/L   Chloride 105 101 - 111 mmol/L   CO2 25 22 - 32 mmol/L   Glucose, Bld 129 (H) 65 - 99 mg/dL   BUN 18 6 - 20 mg/dL   Creatinine, Ser 1.17 0.61 - 1.24 mg/dL   Calcium 9.4 8.9 - 10.3 mg/dL   Total Protein 7.3 6.5 - 8.1 g/dL   Albumin 3.2 (L) 3.5 - 5.0 g/dL   AST 18 15 - 41 U/L   ALT 25 17 - 63 U/L   Alkaline Phosphatase 96 38 - 126 U/L   Total Bilirubin 0.5 0.3 - 1.2 mg/dL   GFR calc non Af Amer >60 >60 mL/min  GFR calc Af Amer >60 >60 mL/min    Comment: (NOTE) The eGFR has been calculated using the CKD EPI equation. This calculation has not been validated in all clinical situations. eGFR's persistently <60 mL/min signify possible Chronic Kidney Disease.    Anion gap 9 5 - 15  Glucose, capillary     Status: Abnormal   Collection Time: 12/19/16  6:44 AM  Result Value Ref Range   Glucose-Capillary 128 (H) 65 - 99 mg/dL  Glucose, capillary     Status: Abnormal   Collection Time: 12/19/16 12:07 PM  Result Value Ref Range   Glucose-Capillary 125 (H) 65 - 99 mg/dL  Glucose, capillary     Status: Abnormal   Collection Time: 12/19/16  4:46 PM  Result Value Ref Range   Glucose-Capillary 100 (H) 65 - 99 mg/dL  Glucose, capillary     Status: Abnormal   Collection Time: 12/19/16  8:42 PM  Result Value Ref Range   Glucose-Capillary 189 (H) 65 - 99 mg/dL  Glucose, capillary     Status: Abnormal   Collection Time: 12/20/16  6:31 AM  Result Value Ref Range   Glucose-Capillary 117 (H) 65 - 99 mg/dL     HEENT: normal Cardio: RRR and no murmur Resp: CTA B/L and unlabored GI: BS positive and NT, ND Extremity:  No Edema Skin:   Intact Neuro: Abnormal Sensory reduce LUE, Abnormal Motor 2- left biceps and  pecs 0/5 grip and Abnormal FMC Ataxic/ dec FMC Musc/Skel:  Other no pain with LUE and LLE ROM Gen NAD   Assessment/Plan: 1. Functional deficits secondary to Right Brainstem pontine and medullary infarcts which require 3+ hours per day of interdisciplinary therapy in a comprehensive inpatient rehab setting. Physiatrist is providing close team supervision and 24 hour management of active medical problems listed below. Physiatrist and rehab team continue to assess barriers to discharge/monitor patient progress toward functional and medical goals. FIM:          Function - Air cabin crew transfer assistive device: Facilities manager lift: Stedy Assist level to toilet: 2 helpers Assist level from toilet: 2 helpers  Function - Chair/bed transfer Chair/bed transfer method: Stand pivot Chair/bed transfer assist level: Maximal assist (Pt 25 - 49%/lift and lower) Chair/bed transfer assistive device: Armrests  Function - Locomotion: Wheelchair Type: Manual Max wheelchair distance: 100 Assist Level: Supervision or verbal cues Assist Level: Supervision or verbal cues Wheel 150 feet activity did not occur: Safety/medical concerns Turns around,maneuvers to table,bed, and toilet,negotiates 3% grade,maneuvers on rugs and over doorsills: No Function - Locomotion: Ambulation Assistive device: Rail in hallway Max distance: 30 Assist level: Maximal assist (Pt 25 - 49%) Assist level: Maximal assist (Pt 25 - 49%) Walk 50 feet with 2 turns activity did not occur: Safety/medical concerns Walk 150 feet activity did not occur: Safety/medical concerns Walk 10 feet on uneven surfaces activity did not occur: Safety/medical concerns  Function - Comprehension Comprehension: Auditory Comprehension assist level: Understands basic 90% of the time/cues < 10% of the time  Function - Expression Expression: Verbal Expression assist level: Expresses basic 90% of the time/requires cueing <  10% of the time.  Function - Social Interaction Social Interaction assist level: Interacts appropriately 90% of the time - Needs monitoring or encouragement for participation or interaction.  Function - Problem Solving Problem solving assist level: Solves basic 90% of the time/requires cueing < 10% of the time  Function - Memory Memory assist level: Recognizes or recalls 90% of the time/requires cueing <  10% of the time Patient normally able to recall (first 3 days only): Current season, That he or she is in a hospital, Staff names and faces  Medical Problem List and Plan: 1.Left side weakness/dysphagiasecondary to right paramedian brainstem infarct/pons/Medullaas well as history of calcified chronic brain mass: onset 11/30 CIR PT, OT, SLP-pt felt therapy was strenuous but will not change schedule at this point 2. DVT Prophylaxis/Anticoagulation: Subcutaneous Lovenox. Monitor for any bleeding episodes 3. Pain Management:Tylenol as needed 4. Mood:Provide emotional support 5. Neuropsych: This patientiscapable of making decisions on hisown behalf. 6. Skin/Wound Care:Routine skin checks 7. Fluids/Electrolytes/Nutrition:Routine I&O's with follow-up chemistries 8.Hypertension. Norvasc 10 mg daily, Coreg 25 mg twice a day, Avapro 150 mg daily. Monitor with increased mobility Elevated 12/7- will increase Avapro to 321m, controlled 12/8 Vitals:   12/19/16 1436 12/20/16 0427  BP: 130/76 138/80  Pulse: 99 87  Resp: 18 18  Temp: 98.8 F (37.1 C) 97.9 F (36.6 C)  SpO2: 100% 97%   9.Hyperlipidemia. Lipitor 10.Diabetes mellitus peripheral neuropathy. Hemoglobin A1c 6.2. SSI. Check blood sugars before meals and at bedtime. Patient on Janumet-XR100-1000 milligrams daily. Resume as needed CBG (last 3)  Recent Labs    12/19/16 1646 12/19/16 2042 12/20/16 0631  GLUCAP 100* 189* 117*  controlled 12/8 LOS (Days) 2 A FACE TO FACE EVALUATION WAS PERFORMED  ALuanna Salk Kirsteins 12/20/2016, 7:18 AM

## 2016-12-20 NOTE — Progress Notes (Signed)
Speech Language Pathology Daily Session Note  Patient Details  Name: [name] MRN: [id number] Date of Birth: 02-25-1965  Today's Date: 12/20/2016 SLP Individual Time: 1330-1400 SLP Individual Time Calculation (min): 30 min  Short Term Goals: Week 1: SLP Short Term Goal 1 (Week 1): STGs=LTGs  Skilled Therapeutic Interventions: Skilled treatment session focused on speech intelligibility. SLP facilitated session by providing intermittent supervision verbal cues for utilization of over-articulation and increased vocal intensity at the sentence level while reading aloud in a moderately distracting environment (television on) to achieve 100% intelligibility. Patient requested to use the bathroom and was left on commode with NT present. Continue with current plan of care.      Function:   Cognition Comprehension Comprehension assist level: Understands basic 90% of the time/cues < 10% of the time  Expression   Expression assist level: Expresses basic 90% of the time/requires cueing < 10% of the time.  Social Interaction Social Interaction assist level: Interacts appropriately 90% of the time - Needs monitoring or encouragement for participation or interaction.  Problem Solving Problem solving assist level: Solves basic 90% of the time/requires cueing < 10% of the time  Memory Memory assist level: Recognizes or recalls 90% of the time/requires cueing < 10% of the time    Pain No/Denies Pain   Therapy/Group: Individual Therapy  Valita Righter 12/20/2016, 2:38 PM

## 2016-12-20 NOTE — Progress Notes (Signed)
Physical Therapy Session Note  Patient Details  Name: [name] MRN: [id number] Date of Birth: [birth year]  Today's Date: 12/20/2016 PT Individual Time: 0800-0900 PT Individual Time Calculation (min): 60 min   Short Term Goals: Week 1:  PT Short Term Goal 1 (Week 1): pt will transfer with mod assist consistently PT Short Term Goal 2 (Week 1): Pt will initiate gait training with RW PT Short Term Goal 3 (Week 1): pt will demonstrate dynamic sitting balance with supervision PT Short Term Goal 4 (Week 1): Pt will demo bed mobility with min assist  Skilled Therapeutic Interventions/Progress Updates:   Pt supine upon arrival and agreeable to therapy, no c/o pain. Transferred to EOB w/ Mod assist. Worked on postural control w/ sitting up at EOB eating breakfast for 10+ minutes. Close supervision for static sitting balance and verbal cues for sitting balance strategies and RUE management to prevent LOB w/ reaching tasks across table. Transferred to w/c mod assist via stand pivot. Pt self-propelled w/c to/from gym w/ supervision using R hemi technique, 150' each way. Worked on LLE NMR while in gym in stance w/ RUE support on quad cane - lateral weight shifts and R stepping toward and back. Max assist for LLE management and balance. Returned to room and ended session in recliner, quick release belt engaged and call bell within reach. All needs met.   Therapy Documentation Precautions:  Precautions Precautions: Fall Restrictions Weight Bearing Restrictions: No  See Function Navigator for Current Functional Status.   Therapy/Group: Individual Therapy  Ermon Sagan K Arnette 12/20/2016, 12:13 PM

## 2016-12-21 LAB — GLUCOSE, CAPILLARY
GLUCOSE-CAPILLARY: 116 mg/dL — AB (ref 65–99)
GLUCOSE-CAPILLARY: 168 mg/dL — AB (ref 65–99)
GLUCOSE-CAPILLARY: 113 mg/dL — AB (ref 65–99)
Glucose-Capillary: 119 mg/dL — ABNORMAL HIGH (ref 65–99)

## 2016-12-22 ENCOUNTER — Inpatient Hospital Stay (HOSPITAL_COMMUNITY): Payer: Self-pay | Admitting: Physical Therapy

## 2016-12-22 ENCOUNTER — Inpatient Hospital Stay (HOSPITAL_COMMUNITY): Payer: Self-pay | Admitting: Occupational Therapy

## 2016-12-22 ENCOUNTER — Inpatient Hospital Stay (HOSPITAL_COMMUNITY): Payer: Self-pay

## 2016-12-22 LAB — GLUCOSE, CAPILLARY
GLUCOSE-CAPILLARY: 102 mg/dL — AB (ref 65–99)
GLUCOSE-CAPILLARY: 93 mg/dL (ref 65–99)
GLUCOSE-CAPILLARY: 128 mg/dL — AB (ref 65–99)
Glucose-Capillary: 166 mg/dL — ABNORMAL HIGH (ref 65–99)

## 2016-12-22 NOTE — Progress Notes (Signed)
Physical Therapy Session Note  Patient Details  Name: [name] MRN: [id number] Date of Birth: [birth year]  Today's Date: 12/22/2016 PT Individual Time: 1305-1555 PT Individual Time Calculation (min): 170 min   Short Term Goals: Week 1:  PT Short Term Goal 1 (Week 1): pt will transfer with mod assist consistently PT Short Term Goal 2 (Week 1): Pt will initiate gait training with RW PT Short Term Goal 3 (Week 1): pt will demonstrate dynamic sitting balance with supervision PT Short Term Goal 4 (Week 1): Pt will demo bed mobility with min assist  Skilled Therapeutic Interventions/Progress Updates:    Patient in room in w/c.  Propelled to gym with R UE/LE but mod cues due to scooting hips too close to edge of chair.  Patient transferred to mat mod A with assist for set up.  On mat worked on sitting balance and forced use for L with sit to stand.  Sit to supine min A for L LE.  Performed L LE NMR placing and picking up and grading movement of L LE.  Performed R sidelying then L sidelying trunk lateral flexion.extension with weight on elbow/forearm with mod A.  Patient side to sit min A and ambulated with quad cane with ace wrap around foot for DF assist x 40' x 2 mod to max A for balance and L foot progression and R weight shift.  Patient in w/c assist to room for time and left with all needs in reach.   Therapy Documentation Precautions:  Precautions Precautions: Fall Restrictions Weight Bearing Restrictions: No Pain: Pain Assessment Pain Assessment: No/denies pain   See Function Navigator for Current Functional Status.   Therapy/Group: Individual Therapy  Reginia Naas 12/22/2016, 4:37 PM

## 2016-12-22 NOTE — Progress Notes (Signed)
Subjective/Complaints:  Pt feels some sensation coming back Left hand  ROS- neg CP/SOB, N/V/D  Objective: Vital Signs: Blood pressure 130/86, pulse 85, temperature 97.8 F (36.6 C), temperature source Oral, resp. rate 16, height 5\' 3"  (1.6 m), weight 86.2 kg (190 lb), SpO2 97 %. No results found. Results for orders placed or performed during the hospital encounter of 12/18/16 (from the past 72 hour(s))  Glucose, capillary     Status: Abnormal   Collection Time: 12/19/16 12:07 PM  Result Value Ref Range   Glucose-Capillary 125 (H) 65 - 99 mg/dL  Glucose, capillary     Status: Abnormal   Collection Time: 12/19/16  4:46 PM  Result Value Ref Range   Glucose-Capillary 100 (H) 65 - 99 mg/dL  Glucose, capillary     Status: Abnormal   Collection Time: 12/19/16  8:42 PM  Result Value Ref Range   Glucose-Capillary 189 (H) 65 - 99 mg/dL  Glucose, capillary     Status: Abnormal   Collection Time: 12/20/16  6:31 AM  Result Value Ref Range   Glucose-Capillary 117 (H) 65 - 99 mg/dL  Glucose, capillary     Status: Abnormal   Collection Time: 12/20/16 11:40 AM  Result Value Ref Range   Glucose-Capillary 115 (H) 65 - 99 mg/dL  Glucose, capillary     Status: Abnormal   Collection Time: 12/20/16  4:34 PM  Result Value Ref Range   Glucose-Capillary 112 (H) 65 - 99 mg/dL  Glucose, capillary     Status: Abnormal   Collection Time: 12/20/16  9:05 PM  Result Value Ref Range   Glucose-Capillary 175 (H) 65 - 99 mg/dL  Glucose, capillary     Status: Abnormal   Collection Time: 12/21/16  6:48 AM  Result Value Ref Range   Glucose-Capillary 116 (H) 65 - 99 mg/dL  Glucose, capillary     Status: Abnormal   Collection Time: 12/21/16 11:41 AM  Result Value Ref Range   Glucose-Capillary 168 (H) 65 - 99 mg/dL  Glucose, capillary     Status: Abnormal   Collection Time: 12/21/16  4:49 PM  Result Value Ref Range   Glucose-Capillary 119 (H) 65 - 99 mg/dL  Glucose, capillary     Status: Abnormal    Collection Time: 12/21/16  9:40 PM  Result Value Ref Range   Glucose-Capillary 113 (H) 65 - 99 mg/dL  Glucose, capillary     Status: Abnormal   Collection Time: 12/22/16  7:01 AM  Result Value Ref Range   Glucose-Capillary 102 (H) 65 - 99 mg/dL     HEENT: normal Cardio: RRR and no murmur Resp: CTA B/L and unlabored GI: BS positive and NT, ND Extremity:  No Edema Skin:   Intact Neuro: Abnormal Sensory reduce LUE, Abnormal Motor 2- left biceps and pecs 0/5 grip and Abnormal FMC Ataxic/ dec FMC Musc/Skel:  Other no pain with LUE and LLE ROM Gen NAD   Assessment/Plan: 1. Functional deficits secondary to Right Brainstem pontine and medullary infarcts which require 3+ hours per day of interdisciplinary therapy in a comprehensive inpatient rehab setting. Physiatrist is providing close team supervision and 24 hour management of active medical problems listed below. Physiatrist and rehab team continue to assess barriers to discharge/monitor patient progress toward functional and medical goals. FIM:       Function - Toileting Toileting steps completed by helper: Adjust clothing prior to toileting, Performs perineal hygiene, Adjust clothing after toileting Toileting Assistive Devices: Toilet aid Assist level: Two helpers  Function -  Air cabin crew transfer assistive device: Facilities manager lift: Stedy Assist level to toilet: Moderate assist (Pt 50 - 74%/lift or lower) Assist level from toilet: Moderate assist (Pt 50 - 74%/lift or lower)  Function - Chair/bed transfer Chair/bed transfer method: Stand pivot Chair/bed transfer assist level: Moderate assist (Pt 50 - 74%/lift or lower) Chair/bed transfer assistive device: Armrests Chair/bed transfer details: Verbal cues for technique, Manual facilitation for weight shifting, Verbal cues for sequencing  Function - Locomotion: Wheelchair Type: Manual Max wheelchair distance: 150' Assist Level: Supervision or verbal  cues Assist Level: Supervision or verbal cues Wheel 150 feet activity did not occur: Safety/medical concerns Assist Level: Supervision or verbal cues Turns around,maneuvers to table,bed, and toilet,negotiates 3% grade,maneuvers on rugs and over doorsills: No Function - Locomotion: Ambulation Assistive device: Rail in hallway Max distance: 30 Assist level: Maximal assist (Pt 25 - 49%) Assist level: Maximal assist (Pt 25 - 49%) Walk 50 feet with 2 turns activity did not occur: Safety/medical concerns Walk 150 feet activity did not occur: Safety/medical concerns Walk 10 feet on uneven surfaces activity did not occur: Safety/medical concerns  Function - Comprehension Comprehension: Auditory Comprehension assist level: Understands basic 90% of the time/cues < 10% of the time  Function - Expression Expression: Verbal Expression assist level: Expresses basic 90% of the time/requires cueing < 10% of the time.  Function - Social Interaction Social Interaction assist level: Interacts appropriately 90% of the time - Needs monitoring or encouragement for participation or interaction.  Function - Problem Solving Problem solving assist level: Solves basic 90% of the time/requires cueing < 10% of the time  Function - Memory Memory assist level: Recognizes or recalls 90% of the time/requires cueing < 10% of the time Patient normally able to recall (first 3 days only): Current season, That he or she is in a hospital, Staff names and faces  Medical Problem List and Plan: 1.Left side weakness/dysphagiasecondary to right paramedian brainstem infarct/pons/Medullaas well as history of calcified chronic brain mass: onset 11/30 CIR PT, OT, SLP-Sensory defs 2. DVT Prophylaxis/Anticoagulation: Subcutaneous Lovenox. Monitor for any bleeding episodes 3. Pain Management:Tylenol as needed 4. Mood:Provide emotional support 5. Neuropsych: This patientiscapable of making decisions on hisown  behalf. 6. Skin/Wound Care:Routine skin checks 7. Fluids/Electrolytes/Nutrition:Routine I&O's with follow-up chemistries 8.Hypertension. Norvasc 10 mg daily, Coreg 25 mg twice a day, Avapro 150 mg daily. Monitor with increased mobility Elevated 12/7- will increase Avapro to 300mg , controlled 12/10 Vitals:   12/21/16 1429 12/22/16 0546  BP: 130/82 130/86  Pulse: 94 85  Resp: 14 16  Temp: 99.2 F (37.3 C) 97.8 F (36.6 C)  SpO2: 97% 97%   9.Hyperlipidemia. Lipitor 10.Diabetes mellitus peripheral neuropathy. Hemoglobin A1c 6.2. SSI. Check blood sugars before meals and at bedtime. Patient on Janumet-XR100-1000 milligrams daily. Resume as needed CBG (last 3)  Recent Labs    12/21/16 1649 12/21/16 2140 12/22/16 0701  GLUCAP 119* 113* 102*  controlled 12/10 11.  Hx rectal cancer with chemo and radiation in remission LOS (Days) 4 A FACE TO FACE EVALUATION WAS PERFORMED  Charlett Blake 12/22/2016, 10:52 AM

## 2016-12-22 NOTE — Progress Notes (Signed)
Occupational Therapy Session Note  Patient Details  Name: [name] MRN: [id number] Date of Birth: 02-15-1965  Today's Date: 12/22/2016 OT Individual Time: 4967-5916 OT Individual Time Calculation (min): 41 min    Short Term Goals: Week 1:  OT Short Term Goal 1 (Week 1): Pt will don shirt using hemi-techniques with min A OT Short Term Goal 2 (Week 1): Pt will demonstrate proficiency with self ROM of LUE with min cues or less. OT Short Term Goal 3 (Week 1): Pt will complete squat-pivot toilet transfer with mod A     Skilled Therapeutic Interventions/Progress Updates:    Tx focus on Lt NMR and standing balance during meaningful activity engagement.   Pt greeted in w/c, ready for tx. He self propelled via hemi technique to therapy apartment. 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 Worked on bilateral and reciprocal shoulder movements in beat to favorite music in gravity min/max planes. AAROM with clasped hands, education emphasis on joint protection. He required assist from OT to fully supinate Lt hand and guide him through wrist flexion/extension. Continued NMR with pt standing at kitchen countertop, swaying hips and moving shoulders to music. Max A sit<stand with Lt knee supported in standing. L UE weightbearing facilitated on counter. Visual biofeedback and modeling utilized throughout tx motor learning. At end of tx pt returned to w/c (Max A for controlling descent due to collapse on Lt side). He self propelled back to room and was left with all needs within reach.     Therapy Documentation Precautions:  Precautions Precautions: Fall Restrictions Weight Bearing Restrictions: No Vital Signs: Therapy Vitals Temp: 98.8 F (37.1 C) Temp Source: Oral Pulse Rate: 85 Resp: 16 BP: 132/78 Patient Position (if appropriate): Sitting Oxygen Therapy SpO2: 97 % O2 Device: Not Delivered Pain: No c/o pain during tx    ADL: ADL ADL Comments: Please see functional  navigato     See Function Navigator for Current Functional Status.   Therapy/Group: Individual Therapy  Nisreen Guise A [place] 12/22/2016, 4:17 PM

## 2016-12-23 ENCOUNTER — Inpatient Hospital Stay (HOSPITAL_COMMUNITY): Payer: Self-pay | Admitting: Physical Therapy

## 2016-12-23 ENCOUNTER — Inpatient Hospital Stay (HOSPITAL_COMMUNITY): Payer: Self-pay | Admitting: Occupational Therapy

## 2016-12-23 ENCOUNTER — Inpatient Hospital Stay (HOSPITAL_COMMUNITY): Payer: Medicaid Other | Admitting: Speech Pathology

## 2016-12-23 ENCOUNTER — Inpatient Hospital Stay (HOSPITAL_COMMUNITY): Payer: Self-pay

## 2016-12-23 LAB — GLUCOSE, CAPILLARY
GLUCOSE-CAPILLARY: 111 mg/dL — AB (ref 65–99)
GLUCOSE-CAPILLARY: 116 mg/dL — AB (ref 65–99)
GLUCOSE-CAPILLARY: 135 mg/dL — AB (ref 65–99)
Glucose-Capillary: 118 mg/dL — ABNORMAL HIGH (ref 65–99)

## 2016-12-23 MED ORDER — NON FORMULARY: 9.0000 mg | Freq: Every day | Status: DC

## 2016-12-23 MED ORDER — MELATONIN 3 MG PO TABS
9.0000 mg | ORAL_TABLET | Freq: Every day | ORAL | Status: DC
  Administered 2016-12-23 – 2017-01-07 (×15): 9 mg via ORAL
  Filled 2016-12-23 (×18): qty 3

## 2016-12-23 NOTE — Progress Notes (Signed)
Occupational Therapy Session Note  Patient Details  Name: [name] MRN: [id number] Date of Birth: 08/18/1965  Today's Date: 12/23/2016 OT Individual Time: 1430-1530 OT Individual Time Calculation (min): 60 min    Short Term Goals: Week 1:  OT Short Term Goal 1 (Week 1): Pt will don shirt using hemi-techniques with min A OT Short Term Goal 2 (Week 1): Pt will demonstrate proficiency with self ROM of LUE with min cues or less. OT Short Term Goal 3 (Week 1): Pt will complete squat-pivot toilet transfer with mod A  Skilled Therapeutic Interventions/Progress Updates:    OT treatment session focused on functional use of L UE, transfer training, and modified bathing/dressing. Stand-pivot bed>wc>tub bench, wc>bed, with mod A to facilitate pivot. Incorporated L NME techniques within bathing tasks with hand over hand A to bring L arm to R under arm. Pt with improved proximal shoulder strength to then push/pull wash cloth,still no grip. Pt needed cues to recall hemi-techniques and overall mod A to dress L hemi side of body. Sit<>stand with L knee block and steadying assist while pulling pants over hips. Worked on standing balance/endurance while standing to brush teeth. Incorporated weight bearing through L UE and LLE, and educated pt on ways to use L hand as a stabilizer to open/close toothpaste. Pt returned to bed at end of session in similar fashion and left semi-reclined in bed with needs met and bed alarm on.   Therapy Documentation Precautions:  Precautions Precautions: Fall Restrictions Weight Bearing Restrictions: No Pain:  none/denies pain ADL: ADL ADL Comments: Please see functional navigato  See Function Navigator for Current Functional Status.   Therapy/Group: Individual Therapy  Valma Cava 12/23/2016, 3:33 PM

## 2016-12-23 NOTE — Progress Notes (Signed)
Physical Therapy Session Note  Patient Details  Name: [name] MRN: [id number] Date of Birth: 16-Oct-1965  Today's Date: 12/23/2016 PT Individual Time: 1100-1200 PT Individual Time Calculation (min): 60 min   Short Term Goals: Week 1:  PT Short Term Goal 1 (Week 1): pt will transfer with mod assist consistently PT Short Term Goal 2 (Week 1): Pt will initiate gait training with RW PT Short Term Goal 3 (Week 1): pt will demonstrate dynamic sitting balance with supervision PT Short Term Goal 4 (Week 1): Pt will demo bed mobility with min assist  Skilled Therapeutic Interventions/Progress Updates:    no c/o pain.  Session focus on NMR and ambulation.   NMR via gait training with hemiwalker, mod assist overall for 2x20', multimodal cues for weight shifting, L step through, and sequencing gait with hemiwalker.    Attempted standing toe taps with RLE and LLE with max assist and pt fearful of falling.  Transition to tall kneeling x2 trials with max assist to transition into tall kneeling.  On first trial pt completes 2x10 sets of terminal hip extension/minisquats with min guard, on second trial completes 10 reps bilaterally alternating sliding each LE forward/backward focus on isolated hip extension in LLE.  Transition back to chair on second attempt requires +2 as pt placed LLE down first.  Pt returned to room at end of session and positioned upright in w/c with call bell in reach and needs met.   Therapy Documentation Precautions:  Precautions Precautions: Fall Restrictions Weight Bearing Restrictions: No   See Function Navigator for Current Functional Status.   Therapy/Group: Individual Therapy  [name] 12/23/2016, 12:12 PM

## 2016-12-23 NOTE — Progress Notes (Signed)
Speech Language Pathology Daily Session Note  Patient Details  Name: [name] MRN: [id number] Date of Birth: 1965-12-26  Today's Date: 12/23/2016 SLP Individual Time: 1345-1425 SLP Individual Time Calculation (min): 40 min  Short Term Goals: Week 1: SLP Short Term Goal 1 (Week 1): STGs=LTGs  Skilled Therapeutic Interventions: Skilled treatment session focused on speech intelligibility goals. Patient independently recalled his speech strategies and utilized them at the sentence and conversation level with supervision-Mod I verbal cues. Patient left upright in bed with alarm on and all needs within reach. Continue with current plan of care.      Function:  Eating Eating   Modified Consistency Diet: No Eating Assist Level: More than reasonable amount of time;Set up assist for   Eating Set Up Assist For: Opening containers       Cognition Comprehension Comprehension assist level: Understands basic 90% of the time/cues < 10% of the time  Expression   Expression assist level: Expresses basic 90% of the time/requires cueing < 10% of the time.  Social Interaction Social Interaction assist level: Interacts appropriately 90% of the time - Needs monitoring or encouragement for participation or interaction.  Problem Solving Problem solving assist level: Solves basic 90% of the time/requires cueing < 10% of the time  Memory Memory assist level: Recognizes or recalls 90% of the time/requires cueing < 10% of the time    Pain No/Denies Pain   Therapy/Group: Individual Therapy  Jamiere Gulas 12/23/2016, 2:54 PM

## 2016-12-23 NOTE — Progress Notes (Signed)
Subjective/Complaints:  Sleep issues, takes melatonin at home  ROS- neg CP/SOB, N/V/D  Objective: Vital Signs: Blood pressure (!) 147/85, pulse 86, temperature 98.6 F (37 C), temperature source Oral, resp. rate 18, height 5\' 3"  (1.6 m), weight 86.2 kg (190 lb), SpO2 96 %. No results found. Results for orders placed or performed during the hospital encounter of 12/18/16 (from the past 72 hour(s))  Glucose, capillary     Status: Abnormal   Collection Time: 12/20/16 11:40 AM  Result Value Ref Range   Glucose-Capillary 115 (H) 65 - 99 mg/dL  Glucose, capillary     Status: Abnormal   Collection Time: 12/20/16  4:34 PM  Result Value Ref Range   Glucose-Capillary 112 (H) 65 - 99 mg/dL  Glucose, capillary     Status: Abnormal   Collection Time: 12/20/16  9:05 PM  Result Value Ref Range   Glucose-Capillary 175 (H) 65 - 99 mg/dL  Glucose, capillary     Status: Abnormal   Collection Time: 12/21/16  6:48 AM  Result Value Ref Range   Glucose-Capillary 116 (H) 65 - 99 mg/dL  Glucose, capillary     Status: Abnormal   Collection Time: 12/21/16 11:41 AM  Result Value Ref Range   Glucose-Capillary 168 (H) 65 - 99 mg/dL  Glucose, capillary     Status: Abnormal   Collection Time: 12/21/16  4:49 PM  Result Value Ref Range   Glucose-Capillary 119 (H) 65 - 99 mg/dL  Glucose, capillary     Status: Abnormal   Collection Time: 12/21/16  9:40 PM  Result Value Ref Range   Glucose-Capillary 113 (H) 65 - 99 mg/dL  Glucose, capillary     Status: Abnormal   Collection Time: 12/22/16  7:01 AM  Result Value Ref Range   Glucose-Capillary 102 (H) 65 - 99 mg/dL  Glucose, capillary     Status: Abnormal   Collection Time: 12/22/16 11:40 AM  Result Value Ref Range   Glucose-Capillary 166 (H) 65 - 99 mg/dL  Glucose, capillary     Status: None   Collection Time: 12/22/16  4:22 PM  Result Value Ref Range   Glucose-Capillary 93 65 - 99 mg/dL  Glucose, capillary     Status: Abnormal   Collection Time:  12/22/16  9:10 PM  Result Value Ref Range   Glucose-Capillary 128 (H) 65 - 99 mg/dL  Glucose, capillary     Status: Abnormal   Collection Time: 12/23/16  6:14 AM  Result Value Ref Range   Glucose-Capillary 118 (H) 65 - 99 mg/dL     HEENT: normal Cardio: RRR and no murmur Resp: CTA B/L and unlabored GI: BS positive and NT, ND Extremity:  No Edema Skin:   Intact Neuro: Abnormal Sensory reduce LUE, Abnormal Motor 2- left biceps and pecs 0/5 grip and Abnormal FMC Ataxic/ dec FMC Musc/Skel:  Other no pain with LUE and LLE ROM Gen NAD   Assessment/Plan: 1. Functional deficits secondary to Right Brainstem pontine and medullary infarcts which require 3+ hours per day of interdisciplinary therapy in a comprehensive inpatient rehab setting. Physiatrist is providing close team supervision and 24 hour management of active medical problems listed below. Physiatrist and rehab team continue to assess barriers to discharge/monitor patient progress toward functional and medical goals. FIM:       Function - Toileting Toileting steps completed by helper: Adjust clothing prior to toileting, Performs perineal hygiene, Adjust clothing after toileting Toileting Assistive Devices: Toilet aid Assist level: Two helpers  Function - Clinical cytogeneticist  Transfers Toilet transfer assistive device: Mechanical lift Mechanical lift: Stedy Assist level to toilet: 2 helpers Assist level from toilet: 2 helpers  Function - Chair/bed transfer Chair/bed transfer [place]: Stand pivot Chair/bed transfer assist level: Moderate assist (Pt 50 - 74%/lift or lower) Chair/bed transfer assistive device: Armrests Chair/bed transfer details: Verbal cues for technique  Function - Locomotion: Wheelchair Type: Manual Max wheelchair distance: 150 Assist Level: Supervision or verbal cues Assist Level: Supervision or verbal cues Wheel 150 feet activity did not occur: Safety/medical concerns Assist Level: Supervision or verbal  cues Turns around,maneuvers to table,bed, and toilet,negotiates 3% grade,maneuvers on rugs and over doorsills: No Function - Locomotion: Ambulation Assistive device: Cane-quad Max distance: 40 Assist level: Maximal assist (Pt 25 - 49%) Assist level: Maximal assist (Pt 25 - 49%) Walk 50 feet with 2 turns activity did not occur: Safety/medical concerns Walk 150 feet activity did not occur: Safety/medical concerns Walk 10 feet on uneven surfaces activity did not occur: Safety/medical concerns  Function - Comprehension Comprehension: Auditory Comprehension assist level: Understands basic 90% of the time/cues < 10% of the time  Function - Expression Expression: Verbal Expression assist level: Expresses basic 90% of the time/requires cueing < 10% of the time.  Function - Social Interaction Social Interaction assist level: Interacts appropriately 90% of the time - Needs monitoring or encouragement for participation or interaction.  Function - Problem Solving Problem solving assist level: Solves basic 90% of the time/requires cueing < 10% of the time  Function - Memory Memory assist level: Recognizes or recalls 90% of the time/requires cueing < 10% of the time Patient normally able to recall (first 3 days only): Current season, That he or she is in a hospital, Staff names and faces  Medical Problem List and Plan: 1.Left side weakness/dysphagiasecondary to right paramedian brainstem infarct/pons/Medullaas well as history of calcified chronic brain mass: onset 11/30 CIR PT, OT, SLP-team conf in am 2. DVT Prophylaxis/Anticoagulation: Subcutaneous Lovenox. Monitor for any bleeding episodes 3. Pain Management:Tylenol as needed 4. Mood:Provide emotional support 5. Neuropsych: This patientiscapable of making decisions on hisown behalf. 6. Skin/Wound Care:Routine skin checks 7. Fluids/Electrolytes/Nutrition:Routine I&O's with follow-up chemistries 8.Hypertension. Norvasc 10 mg  daily, Coreg 25 mg twice a day, Avapro 300 mg daily. Monitor with increased mobility  controlled 12/11 Vitals:   12/22/16 1601 12/23/16 0542  BP: 132/78 (!) 147/85  Pulse: 85 86  Resp: 16 18  Temp: 98.8 F (37.1 C) 98.6 F (37 C)  SpO2: 97% 96%   9.Hyperlipidemia. Lipitor 10.Diabetes mellitus peripheral neuropathy. Hemoglobin A1c 6.2. SSI. Check blood sugars before meals and at bedtime. Patient on Janumet-XR100-1000 milligrams daily. Resume as needed CBG (last 3)  Recent Labs    12/22/16 1622 12/22/16 2110 12/23/16 0614  GLUCAP 93 128* 118*  controlled 12/10 11.  Hx rectal cancer with chemo and radiation in remission 12.  Hx insomnia will resume home melatonin LOS (Days) 5 A FACE TO FACE EVALUATION WAS PERFORMED  Charlett Blake 12/23/2016, 7:35 AM

## 2016-12-23 NOTE — Progress Notes (Signed)
Physical Therapy Session Note  Patient Details  Name: [name] MRN: [id number] Date of Birth: [birth year]  Today's Date: 12/23/2016 PT Individual Time: 0071-2197 PT Individual Time Calculation (min): 45 min   Short Term Goals: Week 1:  PT Short Term Goal 1 (Week 1): pt will transfer with mod assist consistently PT Short Term Goal 2 (Week 1): Pt will initiate gait training with RW PT Short Term Goal 3 (Week 1): pt will demonstrate dynamic sitting balance with supervision PT Short Term Goal 4 (Week 1): Pt will demo bed mobility with min assist  Skilled Therapeutic Interventions/Progress Updates:    Functional sitting balance and postural control re-training EOB in seated and sit <> stand positions for dressing. Pt requires min assist for sit <> stand during pulling up pants and min for balance in standing. Mod assist stand pivot to w/c with cues for upright posture and facilitation for weightshift. NMR during w/c mobility with hemi technqiue for coordination and postural control re-training - pt sliding forward in w/c and cues for awareness to positioning and attempts to scoot back in w/c with cues for hand placement, technique, and awareness to hips in w/c. NMR on Kinetron initially in seated for introduction to machine and reciprocal movement pattern training and progressed to standing for balance re-training, postural control re-training, and L knee control during reciprocal stepping using mirror for visual feedback and forced use of LUE for weightbearing in standing. Pt demonstrating improved coordination and control with practice. Required mod assist for stand step transfer w/c <> kinetron.   Therapy Documentation Precautions:  Precautions Precautions: Fall Restrictions Weight Bearing Restrictions: No  Pain: Pain Assessment Pain Assessment: No/denies pain   See Function Navigator for Current Functional Status.   Therapy/Group: Individual Therapy  Canary Brim Ivory Broad, PT, DPT  12/23/2016, 11:22 AM

## 2016-12-24 ENCOUNTER — Inpatient Hospital Stay (HOSPITAL_COMMUNITY): Payer: Self-pay | Admitting: Physical Therapy

## 2016-12-24 ENCOUNTER — Inpatient Hospital Stay (HOSPITAL_COMMUNITY): Payer: Self-pay

## 2016-12-24 ENCOUNTER — Inpatient Hospital Stay (HOSPITAL_COMMUNITY): Payer: Self-pay | Admitting: Occupational Therapy

## 2016-12-24 ENCOUNTER — Encounter (HOSPITAL_COMMUNITY): Payer: Self-pay | Admitting: Psychology

## 2016-12-24 ENCOUNTER — Inpatient Hospital Stay (HOSPITAL_COMMUNITY): Payer: Medicaid Other | Admitting: Speech Pathology

## 2016-12-24 LAB — GLUCOSE, CAPILLARY
GLUCOSE-CAPILLARY: 105 mg/dL — AB (ref 65–99)
GLUCOSE-CAPILLARY: 96 mg/dL (ref 65–99)
GLUCOSE-CAPILLARY: 96 mg/dL (ref 65–99)
GLUCOSE-CAPILLARY: 127 mg/dL — AB (ref 65–99)

## 2016-12-24 LAB — URINALYSIS, ROUTINE W REFLEX MICROSCOPIC
Bilirubin Urine: NEGATIVE
Glucose, UA: NEGATIVE mg/dL
Hgb urine dipstick: NEGATIVE
KETONES UR: NEGATIVE mg/dL
LEUKOCYTES UA: NEGATIVE
NITRITE: NEGATIVE
PROTEIN: NEGATIVE mg/dL
Specific Gravity, Urine: 1.013 (ref 1.005–1.030)
pH: 5 (ref 5.0–8.0)

## 2016-12-24 NOTE — Plan of Care (Signed)
Progressing

## 2016-12-24 NOTE — Consult Note (Signed)
Neuropsychological Consultation   Patient:   [name]   DOB:   08/10/1965  MR Number:  [id number]  Location:  [place] [address] [facility] [address] [id number] [place] [address] Dept: [place]: [phone]           Date of Service:   12/24/2016  Start Time:   9 AM End Time:   10 AM  Provider/Observer:  Ilean Skill, Psy.D.       Clinical Neuropsychologist       Billing Code/Service: [phone] Units  Chief Complaint:    [name] is a 51 year old male with history of hyertension, diabetes, and chronic calcified brain mass likely meningioma.  Presented on 12/12/2016 with left-sided weakness and in hypertensive state.  MRI showed acute right paramedian brainstem infarct impacting lower pons and upper medulla.  Patient continues with left hemiparisis.  Other cognitive functioning appears to be at baseline.  Patient reports that he is coping well but concerned how he is going to function in life if he does not improve motor function.  Reason for Service:  [name] was referred for neuropsychological consultation due to coping and adaptive issues related to brainstem infarct on 11/30/20187.  Below is the HPI for the current admission.  [name] a 51 y.o.right handed malewith history of hypertension, diabetes mellitus and a chroniccalcifiedbrain mass likely meningioma. Per chart reviewand patient, patientlives with spouse. Independent prior to admission. Works at [employer] part time. One level home to steps to entry.Wife works during the day.Presented 12/12/2016 with left-sided weakness. Noted blood pressure 240/140. Patient had reported he recently ran out of his clonidine and ARB a few months agowith financial constraints. Cranial CT scan reviewed, unremarkable for acute process. Per report, stable right medial frontal region calcified mass in vasogenic edema right  frontal lobe. Stable bilateral anterior frontal lobe encephalomalacia. Patient did not receive TPA. CT angiogram head and neck negative for larger proximal arterial branch occlusion. MRI showed acute right paramedian brainstem infarct affecting lower pons and upper medulla. Heavy calcified mass probably intra-axial involves the medial right hemisphere slightly increased from 2016. Echocardiogram with ejection fraction of 60% grade 1 diastolic dysfunction no source of emboli. Maintained on aspirin for CVA prophylaxis. Subcutaneous Lovenox for DVT prophylaxis.Initially onMechanical soft nectar thick liquid dietwith follow-up swallow study and diet advanced to a regular consistency with thin liquids. Physical and occupational therapy evaluations completed with recommendations of physical medicine rehabilitation consult.Patient was admitted for a comprehensive rehabilitation program   Current Status:  Patient appears to and reports that he is starting to cope with the acute and sudden changes in left side motor function.  He reports that he has some fears about what is next but that he is ready to do all he can to improve.    Behavioral Observation: [name]  presents as a 51 y.o.-year-old Right African American Male who appeared his stated age. his dress was Appropriate and he was Well Groomed and his manners were Appropriate to the situation.  his participation was indicative of Appropriate and Attentive behaviors.  There were physical disabilities noted.  he displayed an appropriate level of cooperation and motivation.     Interactions:    Active Appropriate and Attentive  Attention:   within normal limits and attention span and concentration were age appropriate  Memory:   within normal limits; recent and remote memory intact  Visuo-spatial:  within normal limits  Speech (Volume):  normal  Speech:   normal;   Thought Process:  Coherent and Relevant  Though Content:  WNL; not  suicidal  Orientation:   person, place, time/date and situation  Judgment:   Good  Planning:   Good  Affect:    Anxious  Mood:    Euthymic  Insight:   Good  Intelligence:   high  Substance Use:  No concerns of substance abuse are reported.    Education:   Engineering geologist History:   Past Medical History:  Diagnosis Date  . Colon cancer ([place])    rectal  . Diabetes mellitus without complication ([place])   . Hypertension   . Seizure [facility])         Psychiatric History:  No prior history of psychiatric issues.  Family Med/Psych History:  Family History  Problem Relation Age of Onset  . Hypertension Mother   . Heart disease Mother   . Hyperlipidemia Mother   . Heart failure Mother   . Hypertension Sister   . Hypertension Brother   . Stroke Father   . Hypertension Father   . Colon cancer Maternal Grandmother   . Diabetes Maternal Grandmother     Risk of Suicide/Violence: virtually non-existent   Impression/DX:  [name] is a 51 year old male with history of hyertension, diabetes, and chronic calcified brain mass likely meningioma.  Presented on 12/12/2016 with left-sided weakness and in hypertensive state.  MRI showed acute right paramedian brainstem infarct impacting lower pons and upper medulla.  Patient continues with left hemiparisis.  Other cognitive functioning appears to be at baseline.  Patient reports that he is coping well but concerned how he is going to function in life if he does not improve motor function.  Patient appears to and reports that he is starting to cope with the acute and sudden changes in left side motor function.  He reports that he has some fears about what is next but that he is ready to do all he can to improve.  Disposition/Plan:  Will see the patient again if issues with coping worsen, however, he does appear to be doing well adjusting.        Electronically Signed   _______________________ Ilean Skill,  Psy.D.

## 2016-12-24 NOTE — Progress Notes (Signed)
Physical Therapy Session Note  Patient Details  Name: [name] MRN: [id number] Date of Birth: May 01, 1965  Today's Date: 12/24/2016 PT Individual Time: 0805-0900 PT Individual Time Calculation (min): 55 min   Short Term Goals: Week 1:  PT Short Term Goal 1 (Week 1): pt will transfer with mod assist consistently PT Short Term Goal 2 (Week 1): Pt will initiate gait training with RW PT Short Term Goal 3 (Week 1): pt will demonstrate dynamic sitting balance with supervision PT Short Term Goal 4 (Week 1): Pt will demo bed mobility with min assist  Skilled Therapeutic Interventions/Progress Updates:    Pt received sitting on toilet with RN present, and agreeable to PT.   Sit<>stand off toilet with min assist in stedy to return to [facility]. Pt transported to day room in [facility].   Nustep reciprocal movement training x 8 minutes with L hand splint and LUE support.  Min cues throughout for improved posture and ROM on the LLE as well as improved timing to maintain symmetrical movements.   Gait 2x38f with mod assist from PT and RUE support on HW. PT required to assist the LLE to prevent scissoring of the LLE as well as stabilize the knee intermittently to prevent Genu Recurvatum.   Sit<>supine with min-mod assist. And min cues for sequencing and improved use of BUE for safety.   Supine NMR: bridges with 3 second hold x7. Rolling R and L x 8 Bilaterally, heel slides x12, ankle pumps AAROM x12. All completed bilaterally with cues for proper speed and ROM to improve strengthening aspects of movement and    [facility] and all needs met.         Therapy Documentation Precautions:  Precautions Precautions: Fall Restrictions Weight Bearing Restrictions: No Vital Signs: Therapy Vitals Temp: 98.6 F (37 C) Temp Source: Oral Pulse Rate: 82 Resp: 18 BP: 117/73 Patient Position (if appropriate): Lying Oxygen Therapy SpO2: 100  % O2 Device: Not Delivered Pain: 0/10   See Function Navigator for Current Functional Status.   Therapy/Group: Individual Therapy  ALorie Phenix12/12/2016, 9:05 AM

## 2016-12-24 NOTE — Progress Notes (Signed)
Speech Language Pathology Daily Session Note  Patient Details  Name: [name] MRN: [id number] Date of Birth: August 28, 1965  Today's Date: 12/24/2016 SLP Individual Time: 1300-1345 SLP Individual Time Calculation (min): 45 min  Short Term Goals: Week 1: SLP Short Term Goal 1 (Week 1): STGs=LTGs  Skilled Therapeutic Interventions: Skilled treatment session focused on speech intelligibility goals. SLP facilitated session by providing intermittent supervision verbal cues for use of speech intelligibility strategies during a picture description task at the sentence and conversation level. Patient achieved 100% intelligibility, however, patient continues to report he feels his speech sounds "slurred". Patient left upright in wheelchair with all needs within reach. Continue with current plan of care.      Function:   Cognition Comprehension Comprehension assist level: Understands basic 90% of the time/cues < 10% of the time  Expression   Expression assist level: Expresses basic 90% of the time/requires cueing < 10% of the time.  Social Interaction Social Interaction assist level: Interacts appropriately 90% of the time - Needs monitoring or encouragement for participation or interaction.  Problem Solving Problem solving assist level: Solves basic 90% of the time/requires cueing < 10% of the time  Memory Memory assist level: Recognizes or recalls 90% of the time/requires cueing < 10% of the time    Pain No/Denies Pain   Therapy/Group: Individual Therapy  Jeannelle Wiens 12/24/2016, 2:45 PM

## 2016-12-24 NOTE — Plan of Care (Signed)
Verb understanding, pt is using appropriate safety precautions.

## 2016-12-24 NOTE — Plan of Care (Signed)
Urgency and incontinence of bladder at times.

## 2016-12-24 NOTE — Progress Notes (Signed)
Subjective/Complaints:  Nursing indicates problem with urinary freq, pt denies dysuria  ROS- neg CP/SOB, N/V/D  Objective: Vital Signs: Blood pressure 117/73, pulse 82, temperature 98.6 F (37 C), temperature source Oral, resp. rate 18, height 5' 3"  (1.6 m), weight 86.2 kg (190 lb), SpO2 100 %. No results found. Results for orders placed or performed during the hospital encounter of 12/18/16 (from the past 72 hour(s))  Glucose, capillary     Status: Abnormal   Collection Time: 12/21/16 11:41 AM  Result Value Ref Range   Glucose-Capillary 168 (H) 65 - 99 mg/dL  Glucose, capillary     Status: Abnormal   Collection Time: 12/21/16  4:49 PM  Result Value Ref Range   Glucose-Capillary 119 (H) 65 - 99 mg/dL  Glucose, capillary     Status: Abnormal   Collection Time: 12/21/16  9:40 PM  Result Value Ref Range   Glucose-Capillary 113 (H) 65 - 99 mg/dL  Glucose, capillary     Status: Abnormal   Collection Time: 12/22/16  7:01 AM  Result Value Ref Range   Glucose-Capillary 102 (H) 65 - 99 mg/dL  Glucose, capillary     Status: Abnormal   Collection Time: 12/22/16 11:40 AM  Result Value Ref Range   Glucose-Capillary 166 (H) 65 - 99 mg/dL  Glucose, capillary     Status: None   Collection Time: 12/22/16  4:22 PM  Result Value Ref Range   Glucose-Capillary 93 65 - 99 mg/dL  Glucose, capillary     Status: Abnormal   Collection Time: 12/22/16  9:10 PM  Result Value Ref Range   Glucose-Capillary 128 (H) 65 - 99 mg/dL  Glucose, capillary     Status: Abnormal   Collection Time: 12/23/16  6:14 AM  Result Value Ref Range   Glucose-Capillary 118 (H) 65 - 99 mg/dL  Glucose, capillary     Status: Abnormal   Collection Time: 12/23/16 11:49 AM  Result Value Ref Range   Glucose-Capillary 111 (H) 65 - 99 mg/dL  Glucose, capillary     Status: Abnormal   Collection Time: 12/23/16  5:02 PM  Result Value Ref Range   Glucose-Capillary 116 (H) 65 - 99 mg/dL  Glucose, capillary     Status: Abnormal    Collection Time: 12/23/16  8:51 PM  Result Value Ref Range   Glucose-Capillary 135 (H) 65 - 99 mg/dL  Glucose, capillary     Status: Abnormal   Collection Time: 12/24/16  6:44 AM  Result Value Ref Range   Glucose-Capillary 105 (H) 65 - 99 mg/dL     HEENT: normal Cardio: RRR and no murmur Resp: CTA B/L and unlabored GI: BS positive and NT, ND Extremity:  No Edema Skin:   Intact Neuro: Abnormal Sensory reduce LUE, Abnormal Motor 2- left biceps and pecs 0/5 grip and Abnormal FMC Ataxic/ dec FMC Musc/Skel:  Other no pain with LUE and LLE ROM Gen NAD   Assessment/Plan: 1. Functional deficits secondary to Right Brainstem pontine and medullary infarcts which require 3+ hours per day of interdisciplinary therapy in a comprehensive inpatient rehab setting. Physiatrist is providing close team supervision and 24 hour management of active medical problems listed below. Physiatrist and rehab team continue to assess barriers to discharge/monitor patient progress toward functional and medical goals. FIM: Function - Bathing Position: Shower Body parts bathed by patient: Left arm, Chest, Abdomen, Front perineal area, Right upper leg, Left upper leg Body parts bathed by helper: Right lower leg, Left lower leg, Buttocks, Right arm,  Back Assist Level: Touching or steadying assistance(Pt > 75%)  Function- Upper Body Dressing/Undressing What is the patient wearing?: Pull over shirt/dress Pull over shirt/dress - Perfomed by patient: Thread/unthread right sleeve Pull over shirt/dress - Perfomed by helper: Thread/unthread left sleeve, Put head through opening, Pull shirt over trunk Assist Level: Touching or steadying assistance(Pt > 75%) Function - Lower Body Dressing/Undressing What is the patient wearing?: Pants, Non-skid slipper socks Position: Wheelchair/chair at sink Pants- Performed by patient: Thread/unthread left pants leg Pants- Performed by helper: Thread/unthread right pants leg, Pull  pants up/down Non-skid slipper socks- Performed by helper: Don/doff left sock, Don/doff right sock Socks - Performed by helper: Don/doff right sock, Don/doff left sock Shoes - Performed by helper: Don/doff right shoe, Don/doff left shoe, Fasten right, Fasten left Assist for footwear: Maximal assist Assist for lower body dressing: Touching or steadying assistance (Pt > 75%)  Function - Toileting Toileting activity did not occur: Safety/medical concerns Toileting steps completed by helper: Adjust clothing prior to toileting, Performs perineal hygiene, Adjust clothing after toileting Toileting Assistive Devices: Toilet aid Assist level: More than reasonable time, Set up/obtain supplies, Two helpers  Function - Air cabin crew transfer activity did not occur: Safety/medical concerns Toilet transfer assistive device: Facilities manager lift: Stedy Assist level to toilet: 2 helpers Assist level from toilet: 2 helpers  Function - Chair/bed transfer Chair/bed transfer activity did not occur: N/A Chair/bed transfer method: Stand pivot Chair/bed transfer assist level: Touching or steadying assistance (Pt > 75%) Chair/bed transfer assistive device: Armrests, Walker Chair/bed transfer details: Verbal cues for technique  Function - Locomotion: Wheelchair Type: Manual Max wheelchair distance: 150 Assist Level: Supervision or verbal cues Assist Level: Supervision or verbal cues Wheel 150 feet activity did not occur: Safety/medical concerns Assist Level: Supervision or verbal cues Turns around,maneuvers to table,bed, and toilet,negotiates 3% grade,maneuvers on rugs and over doorsills: No Function - Locomotion: Ambulation Assistive device: Walker-hemi Max distance: 20 Assist level: Moderate assist (Pt 50 - 74%) Assist level: Moderate assist (Pt 50 - 74%) Walk 50 feet with 2 turns activity did not occur: Safety/medical concerns Walk 150 feet activity did not occur:  Safety/medical concerns Walk 10 feet on uneven surfaces activity did not occur: Safety/medical concerns  Function - Comprehension Comprehension: Auditory Comprehension assist level: Understands basic 90% of the time/cues < 10% of the time  Function - Expression Expression: Verbal Expression assist level: Expresses basic 90% of the time/requires cueing < 10% of the time.  Function - Social Interaction Social Interaction assist level: Interacts appropriately 90% of the time - Needs monitoring or encouragement for participation or interaction.  Function - Problem Solving Problem solving assist level: Solves basic 90% of the time/requires cueing < 10% of the time  Function - Memory Memory assist level: Recognizes or recalls 90% of the time/requires cueing < 10% of the time Patient normally able to recall (first 3 days only): Current season, That he or she is in a hospital, Staff names and faces  Medical Problem List and Plan: 1.Left side weakness/dysphagiasecondary to right paramedian brainstem infarct/pons/Medullaas well as history of calcified chronic brain mass: onset 11/30 CIR PT, OT, SLP-Team conference today please see physician documentation under team conference tab, met with team face-to-face to discuss problems,progress, and goals. Formulized individual treatment plan based on medical history, underlying problem and comorbidities. 2. DVT Prophylaxis/Anticoagulation: Subcutaneous Lovenox. Monitor for any bleeding episodes 3. Pain Management:Tylenol as needed 4. Mood:Provide emotional support 5. Neuropsych: This patientiscapable of making decisions on hisown behalf. 6.  Skin/Wound Care:Routine skin checks 7. Fluids/Electrolytes/Nutrition:Routine I&O's with follow-up chemistries 8.Hypertension. Norvasc 10 mg daily, Coreg 25 mg twice a day, Avapro 300 mg daily. Monitor with increased mobility  controlled 12/12 Vitals:   12/23/16 1444 12/24/16 0527  BP: 131/80 117/73   Pulse: 86 82  Resp: 18 18  Temp: 98.2 F (36.8 C) 98.6 F (37 C)  SpO2: 98% 100%   9.Hyperlipidemia. Lipitor 10.Diabetes mellitus peripheral neuropathy. Hemoglobin A1c 6.2. SSI. Check blood sugars before meals and at bedtime. Patient on Janumet-XR100-1000 milligrams daily. Resume as needed CBG (last 3)  Recent Labs    12/23/16 1702 12/23/16 2051 12/24/16 0644  GLUCAP 116* 135* 105*  controlled 12/12 11.  Hx rectal cancer with chemo and radiation in remission 12.  Urinary freq check UA C and S LOS (Days) 6 A FACE TO FACE EVALUATION WAS PERFORMED  Charlett Blake 12/24/2016, 8:24 AM

## 2016-12-24 NOTE — Progress Notes (Signed)
Occupational Therapy Session Note  Patient Details  Name: [name] MRN: [id number] Date of Birth: 13-Jan-1966  Today's Date: 12/24/2016 OT Individual Time: 1000-1100 OT Individual Time Calculation (min): 60 min    Short Term Goals: Week 1:  OT Short Term Goal 1 (Week 1): Pt will don shirt using hemi-techniques with min A OT Short Term Goal 2 (Week 1): Pt will demonstrate proficiency with self ROM of LUE with min cues or less. OT Short Term Goal 3 (Week 1): Pt will complete squat-pivot toilet transfer with mod A  Skilled Therapeutic Interventions/Progress Updates:    Worked on functional use of L UE with grooming tasks at the sink. Pt able to utilize L UE as a stabilizer to open/close toothpaste tube as well as facial cleanser tube. Able to maintain anterior weight shift in wc towards sink while brushing teeth and washing face. Worked on wc mobility with pt propelling wc to laundry room. Worked on standing balance/endurance while standing at washing machine to load clothing in top-loader washer.  1:1 NMES applied to wrist extensors  Ratio 1:31Rate 35 pps Waveform- Asymmetric Ramp 1.0 Pulse 300 Intensity- 25 Duration -   15  Report of pain at the beginning of session 0 Report of pain at the end of session0  No adverse reactions after treatment and is skin intact.   Therapy Documentation Precautions:  Precautions Precautions: Fall Restrictions Weight Bearing Restrictions: No Pain: Pain Assessment Pain Assessment: No/denies pain ADL: ADL ADL Comments: Please see functional navigato  See Function Navigator for Current Functional Status.   Therapy/Group: Individual Therapy  Valma Cava 12/24/2016, 10:21 AM

## 2016-12-24 NOTE — Progress Notes (Signed)
Physical Therapy Session Note  Patient Details  Name: [name] MRN: [id number] Date of Birth: 1965/02/27  Today's Date: 12/24/2016 PT Individual Time: 1130-1200 PT Individual Time Calculation (min): 30 min   Short Term Goals: Week 1:  PT Short Term Goal 1 (Week 1): pt will transfer with mod assist consistently PT Short Term Goal 2 (Week 1): Pt will initiate gait training with RW PT Short Term Goal 3 (Week 1): pt will demonstrate dynamic sitting balance with supervision PT Short Term Goal 4 (Week 1): Pt will demo bed mobility with min assist  Skilled Therapeutic Interventions/Progress Updates:    Session focused on NMR to address dynamic standing balance, postural control, motor control in LLE, and gait training with hemiwalker. Tactile and verbal cues to facilitate upright trunk while in standing during gait and balance activity to transfer clothing from washer to dryer. Pt required min assist for functional dynamic standing balance during laundry task and PT assisted with placement of LUE in weightbearing position. During gait training on carpeted surface, requires facilitation for LLE clearance - good activation at hip noted. May benefit from trial with AFO for DF assist. Required min to mod assist overall x 30' with hemiwalker and cues for placement and positioning of HW during turns. End of session returned back to bed with min assist for stand pivot transfer and to return to supine for LLE management.   Therapy Documentation Precautions:  Precautions Precautions: Fall Precaution Comments: L hemi Restrictions Weight Bearing Restrictions: No  Pain:  Denies pain.   See Function Navigator for Current Functional Status.   Therapy/Group: Individual Therapy  Canary Brim Ivory Broad, PT, DPT  12/24/2016, 2:41 PM

## 2016-12-25 ENCOUNTER — Inpatient Hospital Stay (HOSPITAL_COMMUNITY): Payer: Self-pay | Admitting: Occupational Therapy

## 2016-12-25 ENCOUNTER — Inpatient Hospital Stay (HOSPITAL_COMMUNITY): Payer: Medicaid Other | Admitting: Speech Pathology

## 2016-12-25 ENCOUNTER — Inpatient Hospital Stay (HOSPITAL_COMMUNITY): Payer: Self-pay | Admitting: Physical Therapy

## 2016-12-25 DIAGNOSIS — I69398 Other sequelae of cerebral infarction: Secondary | ICD-10-CM

## 2016-12-25 DIAGNOSIS — I69998 Other sequelae following unspecified cerebrovascular disease: Secondary | ICD-10-CM

## 2016-12-25 DIAGNOSIS — N319 Neuromuscular dysfunction of bladder, unspecified: Secondary | ICD-10-CM

## 2016-12-25 LAB — CREATININE, SERUM
CREATININE: 1.13 mg/dL (ref 0.61–1.24)
GFR calc non Af Amer: >60 mL/min (ref >60)

## 2016-12-25 LAB — GLUCOSE, CAPILLARY
GLUCOSE-CAPILLARY: 124 mg/dL — AB (ref 65–99)
GLUCOSE-CAPILLARY: 137 mg/dL — AB (ref 65–99)
Glucose-Capillary: 143 mg/dL — ABNORMAL HIGH (ref 65–99)
Glucose-Capillary: 124 mg/dL — ABNORMAL HIGH (ref 65–99)

## 2016-12-25 MED ORDER — OXYBUTYNIN CHLORIDE 5 MG PO TABS
2.5000 mg | ORAL_TABLET | Freq: Two times a day (BID) | ORAL | Status: DC
  Administered 2016-12-25 – 2017-01-08 (×29): 2.5 mg via ORAL
  Filled 2016-12-25 (×29): qty 1

## 2016-12-25 NOTE — Progress Notes (Signed)
Speech Language Pathology Daily Session Note  Patient Details  Name: [name] MRN: [id number] Date of Birth: July 24, 1965  Today's Date: 12/25/2016 SLP Individual Time: 1100-1130 SLP Individual Time Calculation (min): 30 min  Short Term Goals: Week 1: SLP Short Term Goal 1 (Week 1): STGs=LTGs  Skilled Therapeutic Interventions: Skilled treatment session focused on speech intelligibility goals. Patient was Mod I during a functional conversation in regards to d/c planning and was 100% intelligible. Patient left upright in wheelchair with all needs within reach. Continue with current plan of care.      Function:  Eating Eating   Modified Consistency Diet: No Eating Assist Level: More than reasonable amount of time;Set up assist for   Eating Set Up Assist For: Opening containers       Cognition Comprehension Comprehension assist level: Follows complex conversation/direction with extra time/assistive device  Expression   Expression assist level: Expresses complex ideas: With extra time/assistive device  Social Interaction Social Interaction assist level: Interacts appropriately 90% of the time - Needs monitoring or encouragement for participation or interaction.  Problem Solving Problem solving assist level: Solves complex 90% of the time/cues < 10% of the time  Memory Memory assist level: Recognizes or recalls 90% of the time/requires cueing < 10% of the time    Pain No/Denies Pain   Therapy/Group: Individual Therapy  Evangelia Laye 12/25/2016, 3:31 PM

## 2016-12-25 NOTE — Progress Notes (Signed)
Social Work Patient ID: [name], male   DOB: 1965/04/20, 51 y.o.   MRN: [id number]  Met with pt to discuss team conference goals mod/i wheelchair level and supervision with ambulation, so pt would be safe to be home alone while wife works. [employer] discharge date 12/27. Have left a message for his wife at home and will await a return call. Pt is pleased with this plan and reports has been contacted regarding disability and medicaid applications. Will continue to work on discharge plans.

## 2016-12-25 NOTE — Progress Notes (Signed)
Subjective/Complaints:  No issues overnite, discussed recovery and rehab process No pain c/os UA results reviewed, pt would like med to slow down bladder  ROS- neg CP/SOB, N/V/D  Objective: Vital Signs: Blood pressure (!) 147/94, pulse 88, temperature 99 F (37.2 C), temperature source Oral, resp. rate 18, height 5\' 3"  (1.6 m), weight 86.2 kg (190 lb), SpO2 95 %. No results found. Results for orders placed or performed during the hospital encounter of 12/18/16 (from the past 72 hour(s))  Glucose, capillary     Status: Abnormal   Collection Time: 12/22/16 11:40 AM  Result Value Ref Range   Glucose-Capillary 166 (H) 65 - 99 mg/dL  Glucose, capillary     Status: None   Collection Time: 12/22/16  4:22 PM  Result Value Ref Range   Glucose-Capillary 93 65 - 99 mg/dL  Glucose, capillary     Status: Abnormal   Collection Time: 12/22/16  9:10 PM  Result Value Ref Range   Glucose-Capillary 128 (H) 65 - 99 mg/dL  Glucose, capillary     Status: Abnormal   Collection Time: 12/23/16  6:14 AM  Result Value Ref Range   Glucose-Capillary 118 (H) 65 - 99 mg/dL  Glucose, capillary     Status: Abnormal   Collection Time: 12/23/16 11:49 AM  Result Value Ref Range   Glucose-Capillary 111 (H) 65 - 99 mg/dL  Glucose, capillary     Status: Abnormal   Collection Time: 12/23/16  5:02 PM  Result Value Ref Range   Glucose-Capillary 116 (H) 65 - 99 mg/dL  Glucose, capillary     Status: Abnormal   Collection Time: 12/23/16  8:51 PM  Result Value Ref Range   Glucose-Capillary 135 (H) 65 - 99 mg/dL  Glucose, capillary     Status: Abnormal   Collection Time: 12/24/16  6:44 AM  Result Value Ref Range   Glucose-Capillary 105 (H) 65 - 99 mg/dL  Glucose, capillary     Status: None   Collection Time: 12/24/16 11:20 AM  Result Value Ref Range   Glucose-Capillary 96 65 - 99 mg/dL  Glucose, capillary     Status: None   Collection Time: 12/24/16  4:30 PM  Result Value Ref Range   Glucose-Capillary 96 65  - 99 mg/dL  Glucose, capillary     Status: Abnormal   Collection Time: 12/24/16  9:38 PM  Result Value Ref Range   Glucose-Capillary 127 (H) 65 - 99 mg/dL  Urinalysis, Routine w reflex microscopic     Status: None   Collection Time: 12/24/16  9:42 PM  Result Value Ref Range   Color, Urine YELLOW YELLOW   APPearance CLEAR CLEAR   Specific Gravity, Urine 1.013 1.005 - 1.030   pH 5.0 5.0 - 8.0   Glucose, UA NEGATIVE NEGATIVE mg/dL   Hgb urine dipstick NEGATIVE NEGATIVE   Bilirubin Urine NEGATIVE NEGATIVE   Ketones, ur NEGATIVE NEGATIVE mg/dL   Protein, ur NEGATIVE NEGATIVE mg/dL   Nitrite NEGATIVE NEGATIVE   Leukocytes, UA NEGATIVE NEGATIVE  Glucose, capillary     Status: Abnormal   Collection Time: 12/25/16  6:57 AM  Result Value Ref Range   Glucose-Capillary 124 (H) 65 - 99 mg/dL     HEENT: normal Cardio: RRR and no murmur Resp: CTA B/L and unlabored GI: BS positive and NT, ND Extremity:  No Edema Skin:   Intact Neuro: Abnormal Sensory reduce LUE, Abnormal Motor 2- left biceps and pecs 0/5 grip and Abnormal [facility]MC Ataxic/ dec [facility] Musc/Skel:  Other  no pain with LUE and LLE ROM Gen NAD   Assessment/Plan: 1. Functional deficits secondary to Right Brainstem pontine and medullary infarcts which require 3+ hours per day of interdisciplinary therapy in a comprehensive inpatient rehab setting. Physiatrist is providing close team supervision and 24 hour management of active medical problems listed below. Physiatrist and rehab team continue to assess barriers to discharge/monitor patient progress toward functional and medical goals. FIM: Function - Bathing Position: Shower Body parts bathed by patient: Left arm, Chest, Abdomen, Front perineal area, Right upper leg, Left upper leg Body parts bathed by helper: Right lower leg, Left lower leg, Buttocks, Right arm, Back Assist Level: Touching or steadying assistance(Pt > 75%)  Function- Upper Body Dressing/Undressing What is the patient  wearing?: Pull over shirt/dress Pull over shirt/dress - Perfomed by patient: Thread/unthread right sleeve Pull over shirt/dress - Perfomed by helper: Thread/unthread left sleeve, Put head through opening, Pull shirt over trunk Assist Level: Touching or steadying assistance(Pt > 75%) Function - Lower Body Dressing/Undressing What is the patient wearing?: Pants, Non-skid slipper socks Position: Wheelchair/chair at sink Pants- Performed by patient: Thread/unthread left pants leg Pants- Performed by helper: Thread/unthread right pants leg, Pull pants up/down Non-skid slipper socks- Performed by helper: Don/doff left sock, Don/doff right sock Socks - Performed by helper: Don/doff right sock, Don/doff left sock Shoes - Performed by helper: Don/doff right shoe, Don/doff left shoe, Fasten right, Fasten left Assist for footwear: Maximal assist Assist for lower body dressing: Touching or steadying assistance (Pt > 75%)  Function - Toileting Toileting steps completed by helper: Adjust clothing prior to toileting, Performs perineal hygiene, Adjust clothing after toileting Toileting Assistive Devices: Toilet aid Assist level: More than reasonable time, Set up/obtain supplies, Two helpers  Function - Air cabin crew transfer activity did not occur: Safety/medical concerns Toilet transfer assistive device: Facilities manager lift: Stedy Assist level to toilet: 2 helpers Assist level from toilet: 2 helpers  Function - Chair/bed transfer Chair/bed transfer activity did not occur: N/A Chair/bed transfer method: Stand pivot Chair/bed transfer assist level: Touching or steadying assistance (Pt > 75%) Chair/bed transfer assistive device: Armrests Chair/bed transfer details: Verbal cues for technique  Function - Locomotion: Wheelchair Type: Manual Max wheelchair distance: 150 Assist Level: Supervision or verbal cues Assist Level: Supervision or verbal cues Wheel 150 feet activity did  not occur: Safety/medical concerns Assist Level: Supervision or verbal cues Turns around,maneuvers to table,bed, and toilet,negotiates 3% grade,maneuvers on rugs and over doorsills: No Function - Locomotion: Ambulation Assistive device: Walker-hemi Max distance: 30' Assist level: Moderate assist (Pt 50 - 74%) Assist level: Moderate assist (Pt 50 - 74%)(min to mod assist) Walk 50 feet with 2 turns activity did not occur: Safety/medical concerns Walk 150 feet activity did not occur: Safety/medical concerns Walk 10 feet on uneven surfaces activity did not occur: Safety/medical concerns  Function - Comprehension Comprehension: Auditory Comprehension assist level: Understands basic 90% of the time/cues < 10% of the time  Function - Expression Expression: Verbal Expression assist level: Expresses basic 90% of the time/requires cueing < 10% of the time.  Function - Social Interaction Social Interaction assist level: Interacts appropriately 90% of the time - Needs monitoring or encouragement for participation or interaction.  Function - Problem Solving Problem solving assist level: Solves basic 90% of the time/requires cueing < 10% of the time  Function - Memory Memory assist level: Recognizes or recalls 90% of the time/requires cueing < 10% of the time Patient normally able to recall (first 3 days  only): Current season, That he or she is in a hospital, Staff names and faces  Medical Problem List and Plan: 1.Left side weakness/dysphagiasecondary to right paramedian brainstem infarct/pons/Medullaas well as history of calcified chronic brain mass: onset 11/30 CIR PT, OT, SLP- 2. DVT Prophylaxis/Anticoagulation: Subcutaneous Lovenox. Monitor for any bleeding episodes 3. Pain Management:Tylenol as needed 4. Mood:Provide emotional support 5. Neuropsych: This patientiscapable of making decisions on hisown behalf. 6. Skin/Wound Care:Routine skin checks 7.  Fluids/Electrolytes/Nutrition:Routine I&O's with follow-up chemistries 8.Hypertension. Norvasc 10 mg daily, Coreg 25 mg twice a day, Avapro 300 mg daily. Monitor with increased mobility  controlled 12/13 Vitals:   12/24/16 1429 12/25/16 0557  BP: (!) 145/90 (!) 147/94  Pulse: 87 88  Resp: 16 18  Temp: 98.4 F (36.9 C) 99 F (37.2 C)  SpO2: 96% 95%   9.Hyperlipidemia. Lipitor 10.Diabetes mellitus peripheral neuropathy. Hemoglobin A1c 6.2. SSI. Check blood sugars before meals and at bedtime. Patient on Janumet-XR100-1000 milligrams daily. Resume as needed CBG (last 3)  Recent Labs    12/24/16 1630 12/24/16 2138 12/25/16 0657  GLUCAP 96 127* 124*  controlled 12/13 11.  Hx rectal cancer with chemo and radiation in remission 12.  Urinary freq neg  UA, suspect CVA related spastic bladder,start oxybutnin given DM make sure he is not retaining check PVR x 3 LOS (Days) 7 A FACE TO FACE EVALUATION WAS PERFORMED  Charlett Blake 12/25/2016, 7:46 AM

## 2016-12-25 NOTE — Progress Notes (Signed)
Occupational Therapy Session Note  Patient Details  Name: [name] MRN: [id number] Date of Birth: Jan 01, 1966  Today's Date: 12/25/2016 OT Individual Time: 1130-1200 OT Individual Time Calculation (min): 30 min    Short Term Goals: Week 1:  OT Short Term Goal 1 (Week 1): Pt will don shirt using hemi-techniques with min A OT Short Term Goal 2 (Week 1): Pt will demonstrate proficiency with self ROM of LUE with min cues or less. OT Short Term Goal 3 (Week 1): Pt will complete squat-pivot toilet transfer with mod A Week 2:     Skilled Therapeutic Interventions/Progress Updates:    1:1 Pt requested to continue focusing on functional ambulation with hemi walker ~33 feet x2 with min to mod A with extra time for weight shifts and ability to clear his left foot.   Therapy Documentation Precautions:  Precautions Precautions: Fall Precaution Comments: L hemi Restrictions Weight Bearing Restrictions: No Pain:  no c/o pain  ADL: ADL ADL Comments: Please see functional navigato  See Function Navigator for Current Functional Status.   Therapy/Group: Individual Therapy  Willeen Cass [facility] 12/25/2016, 3:39 PM

## 2016-12-25 NOTE — Patient Care Conference (Signed)
Inpatient RehabilitationTeam Conference and Plan of Care Update Date: 12/24/2016   Time: 11:15 AM    Patient Name: [name]      Medical Record Number: [id number]  Date of Birth: [birth year] Sex: Male         Room/Bed: 4M06C/4M06C-01 Payor Info: Payor: MEDICAID PENDING / Plan: MEDICAID PENDING / Product Type: *No Product type* /    Admitting Diagnosis: R CVA  Admit Date/Time:  12/18/2016  4:37 PM Admission Comments: No comment available   Primary Diagnosis:  Left hemiparesis ([place]) Principal Problem: Left hemiparesis [facility])  Patient Active Problem List   Diagnosis Date Noted  . Neurogenic bladder due to old stroke 12/25/2016  . Right pontine cerebrovascular accident ([place]) 12/18/2016  . Left hemiparesis ([place])   . Diastolic dysfunction   . Dysphagia, post-stroke   . AKI (acute kidney injury) ([place])   . Prediabetes   . Acute ischemic stroke ([place]) 12/12/2016  . Acute respiratory failure with hypercapnia ([place])   . Seizures ([place]) 07/22/2014  . Hypertension 07/22/2014  . Hyperglycemia 07/22/2014  . Type 2 diabetes mellitus with vascular disease ([place]) 07/22/2014  . Pyrexia   . Brain mass   . Malignant neoplasm of rectum ([place]) 09/02/2007    Expected Discharge Date: Expected Discharge Date: 01/08/17  Team Members Present: Physician leading conference: Dr. Alysia Penna Social Worker Present: Ovidio Kin, LCSW Nurse Present: Leonette Nutting, RN PT Present: Canary Brim, PT OT Present: Cherylynn Ridges, OT SLP Present: Weston Anna, SLP PPS Coordinator present : Daiva Nakayama, RN, CRRN     Current Status/Progress Goal Weekly Team Focus  Medical   Urinary frequency, no dysuria, left hemiparesis         Bowel/Bladder   Continent with intermittent episodes of incontinence. LBM 12/22/16  pt will have normal b/b function will be continent of b/b  mod assist  Assess toileting needs q shift and prn, assist with toileting   Swallow/Nutrition/ Hydration             ADL's   Mod  A overall  Mod I/supervision  L NMR, NMES, funtional transfers, standing balance, activity tolerance   Mobility   min for transfers, mod for gait with hemiwalker, LLE weakness  mod I transfers, supervision for gait with LRAD  LLE NMR, balance, activity tolerance, gait progression with LRAD   Communication   Supervision-Mod I  Mod I  use of speech intelligibility strateiges    Safety/Cognition/ Behavioral Observations            Pain   pt denies any c/o pain  pt will be pain free or have pain that is rated <= 2   Assess pt for pain q shift and prn medicate as needed and reevaluate for relief   Skin   Pt skin is free of breakdown  Skin will remain intact and free of breakdown  Assess skin condition q shift and prn      *See Care Plan and progress notes for long and short-term goals.     Barriers to Discharge  Current Status/Progress Possible Resolutions Date Resolved   Physician    Inaccessible home environment;Decreased caregiver support;Medical stability  Wife works  Progressing towards goals  Aiming for modified independent level with wheelchair      Nursing                  PT                    OT  SLP                SW                Discharge Planning/Teaching Needs:  HOme with wife who can provide evening care needs to be mod/i wheelchair level at least. If gets to supervision level will not be eligible to go to a NH      Team Discussion:  Goals mod/i-wheelchair and supervision with ambulation. BP still running high MD adjusting medicines. Checking if has a UTI-lab pending. Speech seeing 3x weekly for intelligenibility. Getting movement in arm and leg. E-stim on shoulder. Feel home discharge realistic  Revisions to Treatment Plan:  DC 12/27    Continued Need for Acute Rehabilitation Level of Care: The patient requires daily medical management by a physician with specialized training in physical medicine and rehabilitation for the following  conditions: Daily direction of a multidisciplinary physical rehabilitation program to ensure safe treatment while eliciting the highest outcome that is of practical value to the patient.: Yes Daily medical management of patient stability for increased activity during participation in an intensive rehabilitation regime.: Yes Daily analysis of laboratory values and/or radiology reports with any subsequent need for medication adjustment of medical intervention for : Neurological problems  Mechelle Pates, Gardiner Rhyme 12/25/2016, 10:47 AM

## 2016-12-25 NOTE — Progress Notes (Signed)
Occupational Therapy Session Note  Patient Details  Name: [name] MRN: [id number] Date of Birth: June 27, 1965  Today's Date: 12/25/2016 OT Individual Time: 1000-1100 OT Individual Time Calculation (min): 60 min   Short Term Goals: Week 1:  OT Short Term Goal 1 (Week 1): Pt will don shirt using hemi-techniques with min A OT Short Term Goal 2 (Week 1): Pt will demonstrate proficiency with self ROM of LUE with min cues or less. OT Short Term Goal 3 (Week 1): Pt will complete squat-pivot toilet transfer with mod A  Skilled Therapeutic Interventions/Progress Updates:    OT treatment session focused on functional use of L UE, standing balance, and modified bathing/dressing. Stand-pivot transfers bed>wc>tub bench>wc with min A to power up and mod A to pivot w/ facilitation for head/hips relationship. Pt provided with wash mit for L UE. Integrated L UE into bathing tasks with good shoulder activation to push/pull wash cloth. Pt with good recall of hemi-dressing techniques and was able to don shirt with only min A to pull down back. Standing balance with standing grooming tasks while OT facilitated weight bearing through LLE and L UE. L knee hyperextension after 1 minute of standing. Pt returned to sitting and OT assist with donning socks and shoes 2/2 time management. Pt left seated in wc with 1/2 lap tray and needs met.   Therapy Documentation Precautions:  Precautions Precautions: Fall Precaution Comments: L hemi Restrictions Weight Bearing Restrictions: No ADL: ADL ADL Comments: Please see functional navigato  See Function Navigator for Current Functional Status.   Therapy/Group: Individual Therapy  Valma Cava 12/25/2016, 10:36 AM

## 2016-12-25 NOTE — Progress Notes (Signed)
Speech Language Pathology Session Note & Discharge Summary  Patient Details  Name: [name] MRN: [id number] Date of Birth: [birth year]  Today's Date: 12/25/2016 SLP Individual Time: 1415-1500 SLP Individual Time Calculation (min): 45 min   Skilled Therapeutic Interventions: Skilled treatment session focused on speech intelligibility goals. Patient was Mod I for use of speech intelligibility strategies during an extremely distracting/noisy environment and was 100% intelligible. Patient was also 100% intelligible while singing christmas carols. Patient has met all SLP goals and will be d/c from skilled SLP intervention. Patient left upright in wheelchair with all needs within reach.   Patient has met 1 of 1 long term goals.  Patient to discharge at overall Modified Independent level.   Reasons goals not met: N/A   Clinical Impression/Discharge Summary: Patient has made functional gains and has met 1 of 1 LTG's this reporting period. Currently, patient is 100% intelligible at the conversation level and is Mod I for use of speech intelligibility strategies. All SLP goals are met and patient will be discharged from skilled SLP intervention with f/u not warranted at this time.   Care Partner:  Caregiver Able to Provide Assistance: No     Recommendation:  None      Equipment: N/A   Reasons for discharge: Treatment goals met   Patient/Family Agrees with Progress Made and Goals Achieved: No   Function:  Cognition Comprehension Comprehension assist level: Follows complex conversation/direction with extra time/assistive device  Expression   Expression assist level: Expresses complex ideas: With extra time/assistive device  Social Interaction Social Interaction assist level: Interacts appropriately 90% of the time - Needs monitoring or encouragement for participation or interaction.  Problem Solving Problem solving assist level: Solves complex 90% of the time/cues < 10% of the time   Memory Memory assist level: Recognizes or recalls 90% of the time/requires cueing < 10% of the time   Francheska Villeda 12/25/2016, 3:39 PM

## 2016-12-26 ENCOUNTER — Inpatient Hospital Stay (HOSPITAL_COMMUNITY): Payer: Self-pay | Admitting: Occupational Therapy

## 2016-12-26 ENCOUNTER — Inpatient Hospital Stay (HOSPITAL_COMMUNITY): Payer: Self-pay | Admitting: Physical Therapy

## 2016-12-26 ENCOUNTER — Inpatient Hospital Stay (HOSPITAL_COMMUNITY): Payer: Self-pay

## 2016-12-26 LAB — URINE CULTURE

## 2016-12-26 LAB — GLUCOSE, CAPILLARY
GLUCOSE-CAPILLARY: 93 mg/dL (ref 65–99)
GLUCOSE-CAPILLARY: 148 mg/dL — AB (ref 65–99)
Glucose-Capillary: 120 mg/dL — ABNORMAL HIGH (ref 65–99)
Glucose-Capillary: 225 mg/dL — ABNORMAL HIGH (ref 65–99)

## 2016-12-26 NOTE — Progress Notes (Signed)
Physical Therapy Weekly Progress Note  Patient Details  Name: [name] MRN: [id number] Date of Birth: [birth year]  Beginning of progress report period: December 19, 2016 End of progress report period: December 26, 2016  Today's Date: 12/26/2016 PT Individual Time:1505-1600   94mn   Patient has met 4 of 4 short term goals.  Pt is making stedy progress towards long term goals. Pt currently performed sit<>stand transfers with min assist, stand pivot transfers with min-mod assist, Gait with HW up to 348fwith mod assist to control the LLE, and propelling WC with hemi technique at supervision level.   Patient continues to demonstrate the following deficits muscle weakness and muscle joint tightness, impaired timing and sequencing, abnormal tone, unbalanced muscle activation and decreased motor planning, decreased awareness, decreased problem solving, decreased safety awareness and delayed processing and decreased sitting balance, decreased standing balance, decreased postural control, hemiplegia and decreased balance strategies and therefore will continue to benefit from skilled PT intervention to increase functional independence with mobility.  Patient progressing toward long term goals..  Continue plan of care.  PT Short Term Goals Week 1:  PT Short Term Goal 1 (Week 1): pt will transfer with mod assist consistently PT Short Term Goal 1 - Progress (Week 1): Met PT Short Term Goal 2 (Week 1): Pt will initiate gait training with RW PT Short Term Goal 2 - Progress (Week 1): Met PT Short Term Goal 3 (Week 1): pt will demonstrate dynamic sitting balance with supervision PT Short Term Goal 3 - Progress (Week 1): Met PT Short Term Goal 4 (Week 1): Pt will demo bed mobility with min assist PT Short Term Goal 4 - Progress (Week 1): Met Week 2:  PT Short Term Goal 1 (Week 2): Pt [facility] Short Term Goal 2 (Week 2): Pt will perform all bed mobility with supervision  assist  PT Short Term Goal 3 (Week 2): Pt will ambulate [id number] mod assist and LRAD  PT Short Term Goal 4 (Week 2): Pt will ascend 1 step with mod assist and LRD   Skilled Therapeutic Interventions/Progress Updates:   Pt received sitting in WC and agreeable to PT  WC mobility  200f48fth supervision assist and use of hemi technique. Gait training with HW no AFO progressing to AFO on the LLE to control GR. 3 x [id number].68fnoted to have increased step height and knee control to prevent GR with PLS AFO, but noted to intermittently hyperextend with AFO in place. PT also required to prevent scissoring on the LLE with limb advancement. Blocked practice stand pivot transfer training no AD with mod-max assist to prevent L lateral LOB . Stand pivot then instructed x 6 with HW; mod progressing to min assist. Max cues for sequecning and gait pattern, as well as posture and improved trunk extension  Patient returned to room and left sitting in WC witNorth[facility] LLCcall bell in reach and all needs met.           Therapy Documentation Precautions:  Precautions Precautions: Fall Precaution Comments: L hemi Restrictions Weight Bearing Restrictions: No   Vital Signs: Therapy Vitals Temp: 98 F (36.7 C) Temp Source: Oral Pulse Rate: 88 Resp: 16 BP: 119/71 Patient Position (if appropriate): Sitting Oxygen Therapy SpO2: 99 % O2 Device: Not Delivered Pain:   0/10   See Function Navigator for Current Functional Status.  Therapy/Group: Individual Therapy  AustinLorie Phenix/2018, 3:47 PM

## 2016-12-26 NOTE — Progress Notes (Signed)
Subjective/Complaints:  Voiding less freq, no dry mouth , did not awaken last noc  ROS- neg CP/SOB, N/V/D  Objective: Vital Signs: Blood pressure (!) 156/80, pulse 77, temperature 98 F (36.7 C), temperature source Oral, resp. rate 14, height 5' 3"  (1.6 m), weight 86.2 kg (190 lb), SpO2 94 %. No results found. Results for orders placed or performed during the hospital encounter of 12/18/16 (from the past 72 hour(s))  Glucose, capillary     Status: Abnormal   Collection Time: 12/23/16 11:49 AM  Result Value Ref Range   Glucose-Capillary 111 (H) 65 - 99 mg/dL  Glucose, capillary     Status: Abnormal   Collection Time: 12/23/16  5:02 PM  Result Value Ref Range   Glucose-Capillary 116 (H) 65 - 99 mg/dL  Glucose, capillary     Status: Abnormal   Collection Time: 12/23/16  8:51 PM  Result Value Ref Range   Glucose-Capillary 135 (H) 65 - 99 mg/dL  Glucose, capillary     Status: Abnormal   Collection Time: 12/24/16  6:44 AM  Result Value Ref Range   Glucose-Capillary 105 (H) 65 - 99 mg/dL  Glucose, capillary     Status: None   Collection Time: 12/24/16 11:20 AM  Result Value Ref Range   Glucose-Capillary 96 65 - 99 mg/dL  Glucose, capillary     Status: None   Collection Time: 12/24/16  4:30 PM  Result Value Ref Range   Glucose-Capillary 96 65 - 99 mg/dL  Glucose, capillary     Status: Abnormal   Collection Time: 12/24/16  9:38 PM  Result Value Ref Range   Glucose-Capillary 127 (H) 65 - 99 mg/dL  Urinalysis, Routine w reflex microscopic     Status: None   Collection Time: 12/24/16  9:42 PM  Result Value Ref Range   Color, Urine YELLOW YELLOW   APPearance CLEAR CLEAR   Specific Gravity, Urine 1.013 1.005 - 1.030   pH 5.0 5.0 - 8.0   Glucose, UA NEGATIVE NEGATIVE mg/dL   Hgb urine dipstick NEGATIVE NEGATIVE   Bilirubin Urine NEGATIVE NEGATIVE   Ketones, ur NEGATIVE NEGATIVE mg/dL   Protein, ur NEGATIVE NEGATIVE mg/dL   Nitrite NEGATIVE NEGATIVE   Leukocytes, UA NEGATIVE  NEGATIVE  Creatinine, serum     Status: None   Collection Time: 12/25/16  6:44 AM  Result Value Ref Range   Creatinine, Ser 1.13 0.61 - 1.24 mg/dL   GFR calc non Af Amer >60 >60 mL/min   GFR calc Af Amer >60 >60 mL/min    Comment: (NOTE) The eGFR has been calculated using the CKD EPI equation. This calculation has not been validated in all clinical situations. eGFR's persistently <60 mL/min signify possible Chronic Kidney Disease.   Glucose, capillary     Status: Abnormal   Collection Time: 12/25/16  6:57 AM  Result Value Ref Range   Glucose-Capillary 124 (H) 65 - 99 mg/dL  Glucose, capillary     Status: Abnormal   Collection Time: 12/25/16 12:03 PM  Result Value Ref Range   Glucose-Capillary 143 (H) 65 - 99 mg/dL  Glucose, capillary     Status: Abnormal   Collection Time: 12/25/16  5:16 PM  Result Value Ref Range   Glucose-Capillary 124 (H) 65 - 99 mg/dL  Glucose, capillary     Status: Abnormal   Collection Time: 12/25/16  9:59 PM  Result Value Ref Range   Glucose-Capillary 137 (H) 65 - 99 mg/dL  Glucose, capillary     Status:  Abnormal   Collection Time: 12/26/16  7:02 AM  Result Value Ref Range   Glucose-Capillary 120 (H) 65 - 99 mg/dL     HEENT: normal Cardio: RRR and no murmur Resp: CTA B/L and unlabored GI: BS positive and NT, ND Extremity:  No Edema Skin:   Intact Neuro: Abnormal Sensory reduce LUE, Abnormal Motor 2- left biceps and pecs 0/5 grip and Abnormal FMC Ataxic/ dec FMC Musc/Skel:  Other no pain with LUE and LLE ROM Gen NAD   Assessment/Plan: 1. Functional deficits secondary to Right Brainstem pontine and medullary infarcts which require 3+ hours per day of interdisciplinary therapy in a comprehensive inpatient rehab setting. Physiatrist is providing close team supervision and 24 hour management of active medical problems listed below. Physiatrist and rehab team continue to assess barriers to discharge/monitor patient progress toward functional and  medical goals. FIM: Function - Bathing Position: Shower Body parts bathed by patient: Left arm, Chest, Abdomen, Front perineal area, Right upper leg, Left upper leg Body parts bathed by helper: Right lower leg, Left lower leg, Buttocks, Right arm, Back Assist Level: Touching or steadying assistance(Pt > 75%)  Function- Upper Body Dressing/Undressing What is the patient wearing?: Pull over shirt/dress Pull over shirt/dress - Perfomed by patient: Thread/unthread right sleeve Pull over shirt/dress - Perfomed by helper: Thread/unthread left sleeve, Put head through opening, Pull shirt over trunk Assist Level: Touching or steadying assistance(Pt > 75%) Function - Lower Body Dressing/Undressing What is the patient wearing?: Pants, Non-skid slipper socks Position: Wheelchair/chair at sink Pants- Performed by patient: Thread/unthread left pants leg Pants- Performed by helper: Thread/unthread right pants leg, Pull pants up/down Non-skid slipper socks- Performed by helper: Don/doff left sock, Don/doff right sock Socks - Performed by helper: Don/doff right sock, Don/doff left sock Shoes - Performed by helper: Don/doff right shoe, Don/doff left shoe, Fasten right, Fasten left Assist for footwear: Maximal assist Assist for lower body dressing: Touching or steadying assistance (Pt > 75%)  Function - Toileting Toileting activity did not occur: Safety/medical concerns Toileting steps completed by helper: Adjust clothing prior to toileting, Performs perineal hygiene, Adjust clothing after toileting Toileting Assistive Devices: Toilet aid Assist level: More than reasonable time, Set up/obtain supplies, Two helpers  Function - Air cabin crew transfer activity did not occur: Safety/medical concerns Toilet transfer assistive device: Facilities manager lift: Stedy Assist level to toilet: 2 helpers Assist level from toilet: 2 helpers  Function - Chair/bed transfer Chair/bed transfer  activity did not occur: N/A Chair/bed transfer method: Stand pivot Chair/bed transfer assist level: Touching or steadying assistance (Pt > 75%) Chair/bed transfer assistive device: Armrests Chair/bed transfer details: Verbal cues for technique  Function - Locomotion: Wheelchair Type: Manual Max wheelchair distance: 150 Assist Level: Supervision or verbal cues Assist Level: Supervision or verbal cues Wheel 150 feet activity did not occur: Safety/medical concerns Assist Level: Supervision or verbal cues Turns around,maneuvers to table,bed, and toilet,negotiates 3% grade,maneuvers on rugs and over doorsills: No Function - Locomotion: Ambulation Assistive device: Walker-hemi Max distance: 30' Assist level: Moderate assist (Pt 50 - 74%) Assist level: Moderate assist (Pt 50 - 74%)(min to mod assist) Walk 50 feet with 2 turns activity did not occur: Safety/medical concerns Walk 150 feet activity did not occur: Safety/medical concerns Walk 10 feet on uneven surfaces activity did not occur: Safety/medical concerns  Function - Comprehension Comprehension: Auditory Comprehension assist level: Understands basic 90% of the time/cues < 10% of the time  Function - Expression Expression: Verbal Expression assist level: Expresses complex  90% of the time/cues < 10% of the time  Function - Social Interaction Social Interaction assist level: Interacts appropriately 90% of the time - Needs monitoring or encouragement for participation or interaction.  Function - Problem Solving Problem solving assist level: Solves complex 90% of the time/cues < 10% of the time  Function - Memory Memory assist level: Recognizes or recalls 90% of the time/requires cueing < 10% of the time Patient normally able to recall (first 3 days only): Current season, That he or she is in a hospital, Staff names and faces  Medical Problem List and Plan: 1.Left side weakness/dysphagiasecondary to right paramedian brainstem  infarct/pons/Medullaas well as history of calcified chronic brain mass: onset 11/30, ASA 339m per day CIR PT, OT, SLP- 2. DVT Prophylaxis/Anticoagulation: Subcutaneous Lovenox. Monitor for any bleeding episodes 3. Pain Management:Tylenol as needed 4. Mood:Provide emotional support 5. Neuropsych: This patientiscapable of making decisions on hisown behalf. 6. Skin/Wound Care:Routine skin checks 7. Fluids/Electrolytes/Nutrition:Routine I&O's with follow-up chemistries 8.Hypertension. Norvasc 10 mg daily, Coreg 25 mg twice a day, Avapro 300 mg daily. Monitor with increased mobility Systolic creeping up 127/25 may consider diuretic if this persists Vitals:   12/25/16 1500 12/26/16 0527  BP: (!) 153/76 (!) 156/80  Pulse: 88 77  Resp: 18 14  Temp: 98.4 F (36.9 C) 98 F (36.7 C)  SpO2: 100% 94%   9.Hyperlipidemia. Lipitor 10.Diabetes mellitus peripheral neuropathy. Hemoglobin A1c 6.2. SSI. Check blood sugars before meals and at bedtime. Patient on Janumet-XR100-1000 milligrams daily. Resume as needed CBG (last 3)  Recent Labs    12/25/16 1716 12/25/16 2159 12/26/16 0702  GLUCAP 124* 137* 120*  controlled 12/14 11.  Hx rectal cancer with chemo and radiation in remission 12.  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

## 2016-12-26 NOTE — Progress Notes (Signed)
Occupational Therapy Session Note  Patient Details  Name: [name] MRN: [id number] Date of Birth: [birth year]  Today's Date: 12/26/2016 OT Individual Time: 8984-2103 OT Individual Time Calculation (min): 56 min    Short Term Goals: Week 1:  OT Short Term Goal 1 (Week 1): Pt will don shirt using hemi-techniques with min A OT Short Term Goal 1 - Progress (Week 1): Met OT Short Term Goal 2 (Week 1): Pt will demonstrate proficiency with self ROM of LUE with min cues or less. OT Short Term Goal 2 - Progress (Week 1): Met OT Short Term Goal 3 (Week 1): Pt will complete squat-pivot toilet transfer with mod A OT Short Term Goal 3 - Progress (Week 1): Met  Skilled Therapeutic Interventions/Progress Updates:    1:1. No pain reported. Pt requesting to shower. Pt transfers throughout session EOB<>w/c<>TTB with min A and Vc for attention to LLE positioning prior to transfer. Pt bathes at sit to stand level using wash mit on LUE to facilitate bathing body parts for NMR. Pt requires A to wash back and buttocks. OT provides HOH A at sink to apply toothpaste to toothbrush with LUE d/t decreased grasp. Pt dons pull over shirt with VC for pushing sleeve past elbow. Pt requries A to thread pants over LLE, but able to thread RLE and advance pants past hips with touching A for balance and L knee blocking. Exited session with pt seated in bed with call ight in reach and all needs met  Therapy Documentation Precautions:  Precautions Precautions: Fall Precaution Comments: L hemi Restrictions Weight Bearing Restrictions: No  See Function Navigator for Current Functional Status.   Therapy/Group: Individual Therapy  Tonny Branch 12/26/2016, 8:57 AM

## 2016-12-26 NOTE — Progress Notes (Signed)
Physical Therapy Session Note  Patient Details  Name: [name] MRN: [id number] Date of Birth: [birth year]  Today's Date: 12/25/2016 PT Individual Time: 1515-1600   38mn  Short Term Goals: Week 1:  PT Short Term Goal 1 (Week 1): pt will transfer with mod assist consistently PT Short Term Goal 2 (Week 1): Pt will initiate gait training with RW PT Short Term Goal 3 (Week 1): pt will demonstrate dynamic sitting balance with supervision PT Short Term Goal 4 (Week 1): Pt will demo bed mobility with min assist  Skilled Therapeutic Interventions/Progress Updates:      Pt received sitting in WC and agreeable to PT  [facility]stand at stairs with min assist. Reciprocal foot tap on 3 inch step with mod assist 2 x 12; PT required to block the L knee to prevent buckling in stance on the L. Sit<>stand x 10 at parallel bars with min assist from PT and min cues for postural control. Dynamic WC mobility to weave throughout 6 cones x 4 with with min cues for increased turning radius to prevent hitting obstacles. WC mobility back to room with supervision assist. PT was require to instruct pt multiple time to improved sitting position and posture to prevent sliding forward to EOS.  [facility] and all needs met.     Therapy Documentation Precautions:  Precautions Precautions: Fall Precaution Comments: L hemi Restrictions Weight Bearing Restrictions: No Vital Signs: Therapy Vitals Temp: 98 F (36.7 C) Temp Source: Oral Pulse Rate: 77 Resp: 14 BP: (!) 156/80 Patient Position (if appropriate): Lying Oxygen Therapy SpO2: 94 % O2 Device: Not Delivered   See Function Navigator for Current Functional Status.   Therapy/Group: Individual Therapy  ALorie Phenix12/14/2018, 6:12 AM

## 2016-12-26 NOTE — Progress Notes (Signed)
Physical Therapy Session Note  Patient Details  Name: [name] MRN: [id number] Date of Birth: 1965/12/05  Today's Date: 12/26/2016 PT Individual Time: 0900-0930 PT Individual Time Calculation (min): 30 min   Short Term Goals: Week 1:  PT Short Term Goal 1 (Week 1): pt will transfer with mod assist consistently PT Short Term Goal 2 (Week 1): Pt will initiate gait training with RW PT Short Term Goal 3 (Week 1): pt will demonstrate dynamic sitting balance with supervision PT Short Term Goal 4 (Week 1): Pt will demo bed mobility with min assist  Skilled Therapeutic Interventions/Progress Updates:    Pt supine in bed, agreeable to participate in therapy session. Pt reports no pain this AM. Supine to sit with CGA to Min A to scoot L hip forwards once in sitting. Sit to stand Min A, stand pivot transfer bed to w/c with Min A. L LE neuromuscular re-education activities: standing L LE taps on bean bags in // bars, focus on hip flexion to clear L LE off floor, verbal and manual cues for upright trunk; seated L LE taps on bean bags, manual facilitation for hip flexion. Seated ball kicks with focus on use of L LE hip flexion, knee ext/flex. Pt fatigues quickly with use of L LE. Manual w/c propulsion 2 x 150 ft with SBA. Pt left seated in w/c in room with needs in reach at end of session.  Therapy Documentation Precautions:  Precautions Precautions: Fall Precaution Comments: L hemi Restrictions Weight Bearing Restrictions: No  See Function Navigator for Current Functional Status.   Therapy/Group: Individual Therapy  Excell Seltzer, PT, DPT  12/26/2016, 9:48 AM

## 2016-12-26 NOTE — Progress Notes (Signed)
Occupational Therapy Weekly Progress Note  Patient Details  Name: [name] MRN: [id number] Date of Birth: 1965-02-19    Beginning of progress report period: December 19, 2016 End of progress report period: December 26, 2016  Today's Date: 12/26/2016 OT Individual Time: 3748-2707 OT Individual Time Calculation (min): 73 min    Patient has met 3 of 3 short term goals.  Pt is making steady progress with OT treatments at this time.  He is able to complete transfers with consistent mod A and is working towards min A.  LUE function continues to improve with improved proximal shoulder strength.  He has demonstrated good use of L UE as a stabilizer to assist within BADL tasks. Continue with current POC.  Patient continues to demonstrate the following deficits: muscle weakness, abnormal tone, unbalanced muscle activation, decreased coordination and decreased motor planning, decreased attention, decreased problem solving, decreased memory and delayed processing and decreased sitting balance, decreased standing balance, decreased postural control, hemiplegia and decreased balance strategies and therefore will continue to benefit from skilled OT intervention to enhance overall performance with BADL.  Patient progressing toward long term goals..  Continue plan of care.  OT Short Term Goals Week 1:  OT Short Term Goal 1 (Week 1): Pt will don shirt using hemi-techniques with min A OT Short Term Goal 1 - Progress (Week 1): Met OT Short Term Goal 2 (Week 1): Pt will demonstrate proficiency with self ROM of LUE with min cues or less. OT Short Term Goal 2 - Progress (Week 1): Met OT Short Term Goal 3 (Week 1): Pt will complete squat-pivot toilet transfer with mod A OT Short Term Goal 3 - Progress (Week 1): Met Week 2:  OT Short Term Goal 1 (Week 2): Pt will complete toiet transfer with min A OT Short Term Goal 2 (Week 2): Pt will complete 1/3 toileting steps OT Short Term Goal 3 (Week 2): Pt will  maitain standing balance with 75% close supervision while performing grooming task  Skilled Therapeutic Interventions/Progress Updates:    Pt positioned in wc to raised BSC over toilet, with use of grab bars, pt able to power up to stand, then needed facilitation for weight shift to pivot to commode with min A and verbal cues for technique. Pt with successful BM but missed commode when urinating. Pt able to achieve hip hike to complete peri-care with set-up A. Practiced stand-pivot with grab bar to regular height toilet and pt able to complete with min A and improved comfort for toileting. Pt brought to therapy room for L UE NMR and NMES. Facilitated L NMR with weight bearing therapeutic activity. OT provided distal elbow/wrist support. 1:1 NMES applied to wrist extensors and triceps.  Ratio 1:1 Rate 35 pps Waveform- Asymmetric Ramp 1.0 Pulse 300 Intensity- 25 Duration - 15     Report of pain at the beginning of session  Report of pain at the end of session  No adverse reactions after treatment and is skin intact.    See Function Navigator for Current Functional Status.   Therapy/Group: Individual Therapy  Valma Cava 12/26/2016, 6:49 AM

## 2016-12-27 ENCOUNTER — Inpatient Hospital Stay (HOSPITAL_COMMUNITY): Payer: Self-pay

## 2016-12-27 DIAGNOSIS — I69998 Other sequelae following unspecified cerebrovascular disease: Secondary | ICD-10-CM

## 2016-12-27 DIAGNOSIS — N319 Neuromuscular dysfunction of bladder, unspecified: Secondary | ICD-10-CM

## 2016-12-27 LAB — GLUCOSE, CAPILLARY
Glucose-Capillary: 119 mg/dL — ABNORMAL HIGH (ref 65–99)
Glucose-Capillary: 97 mg/dL (ref 65–99)
Glucose-Capillary: 109 mg/dL — ABNORMAL HIGH (ref 65–99)
Glucose-Capillary: 134 mg/dL — ABNORMAL HIGH (ref 65–99)

## 2016-12-27 NOTE — Progress Notes (Signed)
Subjective/Complaints:  Patient had a good night sleep.  He denies any issues this morning.  Had a good night sleep  ROS: pt denies nausea, vomiting, diarrhea, cough, shortness of breath or chest pain   Objective: Vital Signs: Blood pressure (!) 147/75, pulse 86, temperature 99 F (37.2 C), temperature source Oral, resp. rate 18, height 5' 3"  (1.6 m), weight 86.2 kg (190 lb), SpO2 98 %. No results found. Results for orders placed or performed during the hospital encounter of 12/18/16 (from the past 72 hour(s))  Glucose, capillary     Status: None   Collection Time: 12/24/16 11:20 AM  Result Value Ref Range   Glucose-Capillary 96 65 - 99 mg/dL  Glucose, capillary     Status: None   Collection Time: 12/24/16  4:30 PM  Result Value Ref Range   Glucose-Capillary 96 65 - 99 mg/dL  Culture, Urine     Status: Abnormal   Collection Time: 12/24/16  9:20 PM  Result Value Ref Range   Specimen Description URINE, RANDOM    Special Requests NONE    Culture MULTIPLE SPECIES PRESENT, SUGGEST RECOLLECTION (A)    Report Status 12/26/2016 FINAL   Glucose, capillary     Status: Abnormal   Collection Time: 12/24/16  9:38 PM  Result Value Ref Range   Glucose-Capillary 127 (H) 65 - 99 mg/dL  Urinalysis, Routine w reflex microscopic     Status: None   Collection Time: 12/24/16  9:42 PM  Result Value Ref Range   Color, Urine YELLOW YELLOW   APPearance CLEAR CLEAR   Specific Gravity, Urine 1.013 1.005 - 1.030   pH 5.0 5.0 - 8.0   Glucose, UA NEGATIVE NEGATIVE mg/dL   Hgb urine dipstick NEGATIVE NEGATIVE   Bilirubin Urine NEGATIVE NEGATIVE   Ketones, ur NEGATIVE NEGATIVE mg/dL   Protein, ur NEGATIVE NEGATIVE mg/dL   Nitrite NEGATIVE NEGATIVE   Leukocytes, UA NEGATIVE NEGATIVE  Creatinine, serum     Status: None   Collection Time: 12/25/16  6:44 AM  Result Value Ref Range   Creatinine, Ser 1.13 0.61 - 1.24 mg/dL   GFR calc non Af Amer >60 >60 mL/min   GFR calc Af Amer >60 >60 mL/min     Comment: (NOTE) The eGFR has been calculated using the CKD EPI equation. This calculation has not been validated in all clinical situations. eGFR's persistently <60 mL/min signify possible Chronic Kidney Disease.   Glucose, capillary     Status: Abnormal   Collection Time: 12/25/16  6:57 AM  Result Value Ref Range   Glucose-Capillary 124 (H) 65 - 99 mg/dL  Glucose, capillary     Status: Abnormal   Collection Time: 12/25/16 12:03 PM  Result Value Ref Range   Glucose-Capillary 143 (H) 65 - 99 mg/dL  Glucose, capillary     Status: Abnormal   Collection Time: 12/25/16  5:16 PM  Result Value Ref Range   Glucose-Capillary 124 (H) 65 - 99 mg/dL  Glucose, capillary     Status: Abnormal   Collection Time: 12/25/16  9:59 PM  Result Value Ref Range   Glucose-Capillary 137 (H) 65 - 99 mg/dL  Glucose, capillary     Status: Abnormal   Collection Time: 12/26/16  7:02 AM  Result Value Ref Range   Glucose-Capillary 120 (H) 65 - 99 mg/dL  Glucose, capillary     Status: Abnormal   Collection Time: 12/26/16 12:19 PM  Result Value Ref Range   Glucose-Capillary 225 (H) 65 - 99 mg/dL  Glucose, capillary     Status: None   Collection Time: 12/26/16  4:14 PM  Result Value Ref Range   Glucose-Capillary 93 65 - 99 mg/dL  Glucose, capillary     Status: Abnormal   Collection Time: 12/26/16  9:36 PM  Result Value Ref Range   Glucose-Capillary 148 (H) 65 - 99 mg/dL  Glucose, capillary     Status: Abnormal   Collection Time: 12/27/16  6:57 AM  Result Value Ref Range   Glucose-Capillary 119 (H) 65 - 99 mg/dL     HEENT: normal Cardio: RRR without murmur. No JVD  Resp: CTA B/L and unlabored GI: BS positive and NT, ND Extremity:  No Edema Skin:   Intact Neuro: Abnormal Sensory reduce LUE, Abnormal Motor 2- left biceps and pecs 0/5 grip--stable Abnormal FMC Ataxic/ dec FMC Musc/Skel:  Other no pain with LUE and LLE ROM Gen NAD   Assessment/Plan: 1. Functional deficits secondary to Right Brainstem  pontine and medullary infarcts which require 3+ hours per day of interdisciplinary therapy in a comprehensive inpatient rehab setting. Physiatrist is providing close team supervision and 24 hour management of active medical problems listed below. Physiatrist and rehab team continue to assess barriers to discharge/monitor patient progress toward functional and medical goals. FIM: Function - Bathing Position: Shower Body parts bathed by patient: Left arm, Chest, Abdomen, Front perineal area, Right upper leg, Left upper leg Body parts bathed by helper: Right lower leg, Left lower leg, Buttocks, Right arm, Back Assist Level: Touching or steadying assistance(Pt > 75%)  Function- Upper Body Dressing/Undressing What is the patient wearing?: Pull over shirt/dress Pull over shirt/dress - Perfomed by patient: Thread/unthread right sleeve Pull over shirt/dress - Perfomed by helper: Thread/unthread left sleeve, Put head through opening, Pull shirt over trunk Assist Level: Touching or steadying assistance(Pt > 75%) Function - Lower Body Dressing/Undressing What is the patient wearing?: Pants, Non-skid slipper socks Position: Wheelchair/chair at sink Pants- Performed by patient: Thread/unthread left pants leg Pants- Performed by helper: Thread/unthread right pants leg, Pull pants up/down Non-skid slipper socks- Performed by helper: Don/doff left sock, Don/doff right sock Socks - Performed by helper: Don/doff right sock, Don/doff left sock Shoes - Performed by helper: Don/doff right shoe, Don/doff left shoe, Fasten right, Fasten left Assist for footwear: Maximal assist Assist for lower body dressing: Touching or steadying assistance (Pt > 75%)  Function - Toileting Toileting activity did not occur: Safety/medical concerns Toileting steps completed by patient: Adjust clothing prior to toileting Toileting steps completed by helper: Adjust clothing prior to toileting, Performs perineal hygiene, Adjust  clothing after toileting Toileting Assistive Devices: Grab bar or rail Assist level: Touching or steadying assistance (Pt.75%)  Function - Air cabin crew transfer activity did not occur: Safety/medical concerns Toilet transfer assistive device: Radio broadcast assistant lift: Stedy Assist level to toilet: Touching or steadying assistance (Pt > 75%) Assist level from toilet: Touching or steadying assistance (Pt > 75%)  Function - Chair/bed transfer Chair/bed transfer activity did not occur: N/A Chair/bed transfer method: Stand pivot Chair/bed transfer assist level: Touching or steadying assistance (Pt > 75%) Chair/bed transfer assistive device: Walker, Armrests Chair/bed transfer details: Verbal cues for sequencing  Function - Locomotion: Wheelchair Type: Manual Max wheelchair distance: 168f  Assist Level: Supervision or verbal cues Assist Level: Supervision or verbal cues Wheel 150 feet activity did not occur: Safety/medical concerns Assist Level: Supervision or verbal cues Turns around,maneuvers to table,bed, and toilet,negotiates 3% grade,maneuvers on rugs and over doorsills: No Function - Locomotion: Ambulation  Assistive device: Walker-hemi Max distance: 30 Assist level: Moderate assist (Pt 50 - 74%) Assist level: Moderate assist (Pt 50 - 74%) Walk 50 feet with 2 turns activity did not occur: Safety/medical concerns Walk 150 feet activity did not occur: Safety/medical concerns Walk 10 feet on uneven surfaces activity did not occur: Safety/medical concerns  Function - Comprehension Comprehension: Auditory Comprehension assist level: Understands basic 90% of the time/cues < 10% of the time  Function - Expression Expression: Verbal Expression assist level: Expresses complex 90% of the time/cues < 10% of the time  Function - Social Interaction Social Interaction assist level: Interacts appropriately 90% of the time - Needs monitoring or encouragement for participation  or interaction.  Function - Problem Solving Problem solving assist level: Solves complex 90% of the time/cues < 10% of the time  Function - Memory Memory assist level: Recognizes or recalls 90% of the time/requires cueing < 10% of the time Patient normally able to recall (first 3 days only): Current season, Location of own room, Staff names and faces, That he or she is in a hospital  Medical Problem List and Plan: 1.Left side weakness/dysphagiasecondary to right paramedian brainstem infarct/pons/Medullaas well as history of calcified chronic brain mass: onset 11/30, ASA 336m per day CIR PT, OT, SLP-ongoing 2. DVT Prophylaxis/Anticoagulation: Subcutaneous Lovenox. Monitor for any bleeding episodes 3. Pain Management:Tylenol as needed 4. Mood:Provide emotional support 5. Neuropsych: This patientiscapable of making decisions on hisown behalf. 6. Skin/Wound Care:Routine skin checks 7. Fluids/Electrolytes/Nutrition:Routine I&O's with follow-up chemistries 8.Hypertension. Norvasc 10 mg daily, Coreg 25 mg twice a day, Avapro 300 mg daily. Monitor with increased mobility Improved control over the last 24 hours.  Observe for now Vitals:   12/26/16 1451 12/27/16 0316  BP: 119/71 (!) 147/75  Pulse: 88 86  Resp: 16 18  Temp: 98 F (36.7 C) 99 F (37.2 C)  SpO2: 99% 98%   9.Hyperlipidemia. Lipitor 10.Diabetes mellitus peripheral neuropathy. Hemoglobin A1c 6.2. SSI. Check blood sugars before meals and at bedtime. Patient on Janumet-XR100-1000 milligrams daily. Resume as needed CBG (last 3)  Recent Labs    12/26/16 1614 12/26/16 2136 12/27/16 0657  GLUCAP 93 148* 119*  Reasonable control today 11.  Hx rectal cancer with chemo and radiation in remission 12.  Spastic bladder: improving with oxybutnin suspect CVA related spastic bladder, he is not retaining   LOS (Days) 9 A FACE TO FACE EVALUATION WAS PERFORMED  SWARTZ,ZACHARY T 12/27/2016, 8:34 AM

## 2016-12-27 NOTE — Progress Notes (Signed)
Occupational Therapy Session Note  Patient Details  Name: [name] MRN: [id number] Date of Birth: 08/15/1965  Today's Date: 12/27/2016 OT Individual Time: 6286-3817 OT Individual Time Calculation (min): 59 min    Short Term Goals: Week 2:  OT Short Term Goal 1 (Week 2): Pt will complete toiet transfer with min A OT Short Term Goal 2 (Week 2): Pt will complete 1/3 toileting steps OT Short Term Goal 3 (Week 2): Pt will maitain standing balance with 75% close supervision while performing grooming task  Skilled Therapeutic Interventions/Progress Updates:    1:1. Pt with no c/o pain and declining bathing this session. Pt stand pivot transfer with min A EOB>w/c with VC for hand placement. Increased external rotation in LLE noted during sit to stand this session requiring manual facilitation for balance. Pt brushes teeth at sink in standing with touching A for balance. Pt dons long sleeve t shirt with VC for pulling back of sleeve past elbow. Pt dons pull up pants/underwear with touching A for balance and VC for reacher use to thread LLE. OT provides HOH A of grasp of LUE on spoon for UE NMR to complete hand to mouth excursion. Pt with noted shoulder elevation/flexion in mod ranges requiring MOD A to facilitate elbow fleion and total A to facilitate grasp. Exited session with pt seated in w/c with call light in reach and all needs met.   Therapy Documentation Precautions:  Precautions Precautions: Fall Precaution Comments: L hemi Restrictions Weight Bearing Restrictions: No  See Function Navigator for Current Functional Status.   Therapy/Group: Individual Therapy  Tonny Branch 12/27/2016, 8:57 AM

## 2016-12-28 ENCOUNTER — Inpatient Hospital Stay (HOSPITAL_COMMUNITY): Payer: Medicaid Other | Admitting: Occupational Therapy

## 2016-12-28 ENCOUNTER — Inpatient Hospital Stay (HOSPITAL_COMMUNITY): Payer: Self-pay

## 2016-12-28 LAB — GLUCOSE, CAPILLARY
GLUCOSE-CAPILLARY: 105 mg/dL — AB (ref 65–99)
GLUCOSE-CAPILLARY: 206 mg/dL — AB (ref 65–99)
Glucose-Capillary: 129 mg/dL — ABNORMAL HIGH (ref 65–99)
Glucose-Capillary: 174 mg/dL — ABNORMAL HIGH (ref 65–99)

## 2016-12-28 NOTE — Progress Notes (Signed)
Physical Therapy Session Note  Patient Details  Name: [name] MRN: [id number] Date of Birth: [birth year]  Today's Date: 12/28/2016 PT Individual Time: 4497-5300 PT Individual Time Calculation (min): 30 min   Short Term Goals: Week 2:  PT Short Term Goal 1 (Week 2): Pt will transfer with min assist to [facility] PT Short Term Goal 2 (Week 2): Pt will perform all bed mobility with supervision assist  PT Short Term Goal 3 (Week 2): Pt will ambulate 34ft with mod assist and LRAD  PT Short Term Goal 4 (Week 2): Pt will ascend 1 step with mod assist and LRD   Skilled Therapeutic Interventions/Progress Updates:    Pt supine in bed upon PT arrival, agreeable to therapy tx and denies pain. Pt transferred supine>sitting EOB with min assist. Pt performed squat pivot transfer from bed>w/c with min assist. Pt transported to gym. Pt performed squat pivot transfer from w/c<>mat with min assist. Pt performed x 10 sit<>stands without UE support and emphasis on symmetric LE weightbearing and L knee control to prevent hyperextension. Pt performed 2 x 5 L LE lateral step ups on 4 inch step working on knee control and L neuro re-ed. Pt left seated in w/c at end of session with needs in reach.   Therapy Documentation Precautions:  Precautions Precautions: Fall Precaution Comments: L hemi Restrictions Weight Bearing Restrictions: No   See Function Navigator for Current Functional Status.   Therapy/Group: Individual Therapy  Netta Corrigan, PT, DPT 12/28/2016, 7:54 AM

## 2016-12-28 NOTE — Progress Notes (Signed)
Subjective/Complaints:  Lying in bed, "sleeping in this morning"  ROS: pt denies nausea, vomiting, diarrhea, cough, shortness of breath or chest pain    Objective: Vital Signs: Blood pressure 136/89, pulse 83, temperature 99.1 F (37.3 C), temperature source Oral, resp. rate 18, height 5\' 3"  (1.6 m), weight 86.2 kg (190 lb), SpO2 99 %. No results found. Results for orders placed or performed during the hospital encounter of 12/18/16 (from the past 72 hour(s))  Glucose, capillary     Status: Abnormal   Collection Time: 12/25/16 12:03 PM  Result Value Ref Range   Glucose-Capillary 143 (H) 65 - 99 mg/dL  Glucose, capillary     Status: Abnormal   Collection Time: 12/25/16  5:16 PM  Result Value Ref Range   Glucose-Capillary 124 (H) 65 - 99 mg/dL  Glucose, capillary     Status: Abnormal   Collection Time: 12/25/16  9:59 PM  Result Value Ref Range   Glucose-Capillary 137 (H) 65 - 99 mg/dL  Glucose, capillary     Status: Abnormal   Collection Time: 12/26/16  7:02 AM  Result Value Ref Range   Glucose-Capillary 120 (H) 65 - 99 mg/dL  Glucose, capillary     Status: Abnormal   Collection Time: 12/26/16 12:19 PM  Result Value Ref Range   Glucose-Capillary 225 (H) 65 - 99 mg/dL  Glucose, capillary     Status: None   Collection Time: 12/26/16  4:14 PM  Result Value Ref Range   Glucose-Capillary 93 65 - 99 mg/dL  Glucose, capillary     Status: Abnormal   Collection Time: 12/26/16  9:36 PM  Result Value Ref Range   Glucose-Capillary 148 (H) 65 - 99 mg/dL  Glucose, capillary     Status: Abnormal   Collection Time: 12/27/16  6:57 AM  Result Value Ref Range   Glucose-Capillary 119 (H) 65 - 99 mg/dL  Glucose, capillary     Status: None   Collection Time: 12/27/16 12:14 PM  Result Value Ref Range   Glucose-Capillary 97 65 - 99 mg/dL  Glucose, capillary     Status: Abnormal   Collection Time: 12/27/16  4:41 PM  Result Value Ref Range   Glucose-Capillary 109 (H) 65 - 99 mg/dL   Glucose, capillary     Status: Abnormal   Collection Time: 12/27/16  8:52 PM  Result Value Ref Range   Glucose-Capillary 134 (H) 65 - 99 mg/dL  Glucose, capillary     Status: Abnormal   Collection Time: 12/28/16  6:57 AM  Result Value Ref Range   Glucose-Capillary 129 (H) 65 - 99 mg/dL     HEENT: normal Cardio: RRR without murmur. No JVD   Resp: CTA Bilaterally without wheezes or rales. Normal effort  GI: BS positive and NT, ND Extremity:  No Edema Skin:   Intact Neuro: Abnormal Sensory reduce LUE, Abnormal Motor 2- left biceps and pecs 0/5 grip--stable Abnormal FMC Ataxic/decreased fine motor coordination Musc/Skel:  Other no pain with LUE and LLE ROM Gen no apparent distress   Assessment/Plan: 1. Functional deficits secondary to Right Brainstem pontine and medullary infarcts which require 3+ hours per day of interdisciplinary therapy in a comprehensive inpatient rehab setting. Physiatrist is providing close team supervision and 24 hour management of active medical problems listed below. Physiatrist and rehab team continue to assess barriers to discharge/monitor patient progress toward functional and medical goals. FIM: Function - Bathing Position: Shower Body parts bathed by patient: Left arm, Chest, Abdomen, Front perineal area, Right upper  leg, Left upper leg Body parts bathed by helper: Right lower leg, Left lower leg, Buttocks, Right arm, Back Assist Level: Touching or steadying assistance(Pt > 75%)  Function- Upper Body Dressing/Undressing What is the patient wearing?: Pull over shirt/dress Pull over shirt/dress - Perfomed by patient: Thread/unthread right sleeve Pull over shirt/dress - Perfomed by helper: Thread/unthread left sleeve, Put head through opening, Pull shirt over trunk Assist Level: Touching or steadying assistance(Pt > 75%) Function - Lower Body Dressing/Undressing What is the patient wearing?: Pants, Non-skid slipper socks Position: Wheelchair/chair at  sink Pants- Performed by patient: Thread/unthread left pants leg Pants- Performed by helper: Thread/unthread right pants leg, Pull pants up/down Non-skid slipper socks- Performed by helper: Don/doff left sock, Don/doff right sock Socks - Performed by helper: Don/doff right sock, Don/doff left sock Shoes - Performed by helper: Don/doff right shoe, Don/doff left shoe, Fasten right, Fasten left Assist for footwear: Maximal assist Assist for lower body dressing: Touching or steadying assistance (Pt > 75%)  Function - Toileting Toileting activity did not occur: (No BM's, used urinal) Toileting steps completed by patient: Adjust clothing prior to toileting Toileting steps completed by helper: Adjust clothing prior to toileting, Performs perineal hygiene, Adjust clothing after toileting Toileting Assistive Devices: Grab bar or rail Assist level: Touching or steadying assistance (Pt.75%)  Function - Air cabin crew transfer activity did not occur: Safety/medical concerns Toilet transfer assistive device: Radio broadcast assistant lift: Stedy Assist level to toilet: Touching or steadying assistance (Pt > 75%) Assist level from toilet: Touching or steadying assistance (Pt > 75%)  Function - Chair/bed transfer Chair/bed transfer activity did not occur: N/A Chair/bed transfer method: Stand pivot Chair/bed transfer assist level: Touching or steadying assistance (Pt > 75%) Chair/bed transfer assistive device: Walker, Armrests Chair/bed transfer details: Verbal cues for sequencing  Function - Locomotion: Wheelchair Type: Manual Max wheelchair distance: 158ft  Assist Level: Supervision or verbal cues Assist Level: Supervision or verbal cues Wheel 150 feet activity did not occur: Safety/medical concerns Assist Level: Supervision or verbal cues Turns around,maneuvers to table,bed, and toilet,negotiates 3% grade,maneuvers on rugs and over doorsills: No Function - Locomotion:  Ambulation Assistive device: Walker-hemi Max distance: 30 Assist level: Moderate assist (Pt 50 - 74%) Assist level: Moderate assist (Pt 50 - 74%) Walk 50 feet with 2 turns activity did not occur: Safety/medical concerns Walk 150 feet activity did not occur: Safety/medical concerns Walk 10 feet on uneven surfaces activity did not occur: Safety/medical concerns  Function - Comprehension Comprehension: Auditory Comprehension assist level: Understands basic 90% of the time/cues < 10% of the time  Function - Expression Expression: Verbal Expression assist level: Expresses complex 90% of the time/cues < 10% of the time  Function - Social Interaction Social Interaction assist level: Interacts appropriately 90% of the time - Needs monitoring or encouragement for participation or interaction.  Function - Problem Solving Problem solving assist level: Solves complex 90% of the time/cues < 10% of the time  Function - Memory Memory assist level: Recognizes or recalls 90% of the time/requires cueing < 10% of the time Patient normally able to recall (first 3 days only): Current season, Location of own room, Staff names and faces, That he or she is in a hospital  Medical Problem List and Plan: 1.Left side weakness/dysphagiasecondary to right paramedian brainstem infarct/pons/Medullaas well as history of calcified chronic brain mass: onset 11/30, ASA 325mg  per day CIR PT, OT, SLP-ongoing 2. DVT Prophylaxis/Anticoagulation: Subcutaneous Lovenox. Monitor for any bleeding episodes 3. Pain Management:Tylenol as needed 4.  Mood:Provide emotional support 5. Neuropsych: This patientiscapable of making decisions on hisown behalf. 6. Skin/Wound Care:Routine skin checks 7. Fluids/Electrolytes/Nutrition:Routine I&O's with follow-up chemistries 8.Hypertension. Norvasc 10 mg daily, Coreg 25 mg twice a day, Avapro 300 mg daily. Monitor with increased mobility Improved control this weekend.   Continue to observe Vitals:   12/27/16 1610 12/28/16 0500  BP: 139/73 136/89  Pulse: 81 83  Resp: 18 18  Temp: 98.4 F (36.9 C) 99.1 F (37.3 C)  SpO2: 98% 99%   9.Hyperlipidemia. Lipitor 10.Diabetes mellitus peripheral neuropathy. Hemoglobin A1c 6.2. SSI. Check blood sugars before meals and at bedtime. Patient on Janumet-XR100-1000 milligrams daily. Resume as needed CBG (last 3)  Recent Labs    12/27/16 1641 12/27/16 2052 12/28/16 0657  GLUCAP 109* 134* 129*  Reasonable control at present 11.  Hx rectal cancer with chemo and radiation in remission 12.  Spastic bladder: improving with oxybutnin suspect CVA related spastic bladder, he is not retaining at present   LOS (Days) 10 A FACE TO FACE EVALUATION WAS PERFORMED  SWARTZ,ZACHARY T 12/28/2016, 8:17 AM

## 2016-12-28 NOTE — Progress Notes (Signed)
Occupational Therapy Note  Patient Details  Name: Antone D Milks MRN: [id number] Date of Birth: 10/09/1965  Today's Date: 12/28/2016 OT Individual Time: 1415-1445 OT Individual Time Calculation (min): 30 min   No c/o pain.  Pt received in bed and agreeable to donning clothing. Pt rolled to EOB with S and then min A to fully sit up.  Pt did feel slightly dizzy and rested for a minute.  donned a shirt with guiding A to set up L arm hole and pants with guiding A to cross L leg over R knee.  stedying a with sit to stand and standing to pull pants up.  The majority of the session was focused on LUE AROM facilitation.  From EOB. Table set up for gravity eliminated towel glides for sh flex/ext and elb flex/ext. Pt can move through flexion movements 75% of the range and extension movements 20% of range.  Pt also practiced pushing hemiwalker back and forth with L hand with 75% A.  Pt requested to lay back in bed to rest before his PT session.  Pt in bed with all needs met.   SAGUIER,JULIA 12/28/2016, 3:10 PM   

## 2016-12-29 ENCOUNTER — Inpatient Hospital Stay (HOSPITAL_COMMUNITY): Payer: Self-pay | Admitting: Physical Therapy

## 2016-12-29 ENCOUNTER — Inpatient Hospital Stay (HOSPITAL_COMMUNITY): Payer: Self-pay

## 2016-12-29 ENCOUNTER — Inpatient Hospital Stay (HOSPITAL_COMMUNITY): Payer: Medicaid Other | Admitting: Occupational Therapy

## 2016-12-29 DIAGNOSIS — I10 Essential (primary) hypertension: Secondary | ICD-10-CM

## 2016-12-29 LAB — GLUCOSE, CAPILLARY
GLUCOSE-CAPILLARY: 113 mg/dL — AB (ref 65–99)
GLUCOSE-CAPILLARY: 130 mg/dL — AB (ref 65–99)
GLUCOSE-CAPILLARY: 87 mg/dL (ref 65–99)
GLUCOSE-CAPILLARY: 118 mg/dL — AB (ref 65–99)

## 2016-12-29 MED ORDER — FLUOXETINE HCL 10 MG PO CAPS
10.0000 mg | ORAL_CAPSULE | Freq: Every day | ORAL | Status: DC
  Administered 2016-12-29 – 2017-01-08 (×11): 10 mg via ORAL
  Filled 2016-12-29 (×11): qty 1

## 2016-12-29 NOTE — Progress Notes (Signed)
Physical Therapy Session Note  Patient Details  Name: [name] MRN: [id number] Date of Birth: April 29, 1965  Today's Date: 12/29/2016 PT Individual Time: 1415-1530 PT Individual Time Calculation (min): 75 min   Short Term Goals: Week 2:  PT Short Term Goal 1 (Week 2): Pt will transfer with min assist to [facility] PT Short Term Goal 2 (Week 2): Pt will perform all bed mobility with supervision assist  PT Short Term Goal 3 (Week 2): Pt will ambulate 14f with mod assist and LRAD  PT Short Term Goal 4 (Week 2): Pt will ascend 1 step with mod assist and LRD   Skilled Therapeutic Interventions/Progress Updates:    no c/o pain.  Session focus on NMR for trunk control, LLE stance control, and L weight shift via ambulation and gait training.    Pt completes 3 trials of gait training on LiteGait for NMR (158' + 150' +130') with mod fade to min assist for weight shift and mod multimodal cues for step length, upright posture, and weight shift.    Gait training x100' with bilat HHA (max assist) focus on carryover of step length, heel strike, and upright posture.  Pt returned to room and PT provided education for safe w/c set up for transfers.  Pt able to transfer with supervision, but requires min assist for sit>supine as he slide towards the EOB.  Positioned to comfort with call bell in reach and needs met.   Therapy Documentation Precautions:  Precautions Precautions: Fall Precaution Comments: L hemi Restrictions Weight Bearing Restrictions: No   See Function Navigator for Current Functional Status.   Therapy/Group: Individual Therapy  CMichel Santee12/17/2018, 4:32 PM

## 2016-12-29 NOTE — Progress Notes (Signed)
Physical Therapy Session Note  Patient Details  Name: [name] MRN: [id number] Date of Birth: Sep 27, 1965  Today's Date: 12/29/2016 PT Individual Time: 0901-1000 PT Individual Time Calculation (min): 59 min   Short Term Goals: Week 2:  PT Short Term Goal 1 (Week 2): Pt will transfer with min assist to [facility] PT Short Term Goal 2 (Week 2): Pt will perform all bed mobility with supervision assist  PT Short Term Goal 3 (Week 2): Pt will ambulate 79ft with mod assist and LRAD  PT Short Term Goal 4 (Week 2): Pt will ascend 1 step with mod assist and LRD   Skilled Therapeutic Interventions/Progress Updates:    Pt supine in bed upon PT arrival, agreeable to therapy tx and denies pain. Pt transferred from bed>w/c with mod assist squat pivot transfer. Pt propelled w/c from room>gym with supervision, pts hips began to slide forward out of the chair, requiring mod assist to scoot back in the w/c. Pt transferred from w/c>mat with min assist, squat pivot transfer. Pt worked on Hotel manager with left hand orthosis, emphasis on L knee control and preventing hyperextension. Pt ambulated 2 x 30 ft using RW and min assist, verbal and tactile cues to prevent L knee hyperextension, verbal cues to prevent scissoring and verbal cues for symmetric step length. Pt continues to demonstrate hyperextension with ambulation despite verbal and tactile cues. Pt worked on L knee control and standing balance without UE support, pt working on weightshifting over L LE while preventing knee hyperextension with manual facilitation. Pt performed R heel raises while standing without UE support working on L knee control. Pt performed x 20 mini squats without UE support, focus on symmetric LE weightbearing and manual facilitation to block L knee hyperextension. Pt transferred from mat>w/c min assist squat pivot and left seated in w/c in room with needs in reach.    Therapy Documentation Precautions:  Precautions Precautions:  Fall Precaution Comments: L hemi Restrictions Weight Bearing Restrictions: No   See Function Navigator for Current Functional Status.   Therapy/Group: Individual Therapy  Netta Corrigan, PT, DPT 12/29/2016, 9:27 AM

## 2016-12-29 NOTE — Progress Notes (Addendum)
Subjective/Complaints: Pt seen sitting up in bed this AM, talking on the phone.  He slept well overnight.  He states he had a good weekend.    ROS: Denies nausea, vomiting, diarrhea, shortness of breath or chest pain   Objective: Vital Signs: Blood pressure (!) 143/84, pulse 80, temperature 98.2 F (36.8 C), temperature source Oral, resp. rate 18, height 5\' 3"  (1.6 m), weight 86.2 kg (190 lb), SpO2 96 %. No results found. Results for orders placed or performed during the hospital encounter of 12/18/16 (from the past 72 hour(s))  Glucose, capillary     Status: Abnormal   Collection Time: 12/26/16 12:19 PM  Result Value Ref Range   Glucose-Capillary 225 (H) 65 - 99 mg/dL  Glucose, capillary     Status: None   Collection Time: 12/26/16  4:14 PM  Result Value Ref Range   Glucose-Capillary 93 65 - 99 mg/dL  Glucose, capillary     Status: Abnormal   Collection Time: 12/26/16  9:36 PM  Result Value Ref Range   Glucose-Capillary 148 (H) 65 - 99 mg/dL  Glucose, capillary     Status: Abnormal   Collection Time: 12/27/16  6:57 AM  Result Value Ref Range   Glucose-Capillary 119 (H) 65 - 99 mg/dL  Glucose, capillary     Status: None   Collection Time: 12/27/16 12:14 PM  Result Value Ref Range   Glucose-Capillary 97 65 - 99 mg/dL  Glucose, capillary     Status: Abnormal   Collection Time: 12/27/16  4:41 PM  Result Value Ref Range   Glucose-Capillary 109 (H) 65 - 99 mg/dL  Glucose, capillary     Status: Abnormal   Collection Time: 12/27/16  8:52 PM  Result Value Ref Range   Glucose-Capillary 134 (H) 65 - 99 mg/dL  Glucose, capillary     Status: Abnormal   Collection Time: 12/28/16  6:57 AM  Result Value Ref Range   Glucose-Capillary 129 (H) 65 - 99 mg/dL  Glucose, capillary     Status: Abnormal   Collection Time: 12/28/16 11:36 AM  Result Value Ref Range   Glucose-Capillary 174 (H) 65 - 99 mg/dL  Glucose, capillary     Status: Abnormal   Collection Time: 12/28/16  4:32 PM  Result  Value Ref Range   Glucose-Capillary 105 (H) 65 - 99 mg/dL  Glucose, capillary     Status: Abnormal   Collection Time: 12/28/16  8:51 PM  Result Value Ref Range   Glucose-Capillary 206 (H) 65 - 99 mg/dL  Glucose, capillary     Status: Abnormal   Collection Time: 12/29/16  6:44 AM  Result Value Ref Range   Glucose-Capillary 113 (H) 65 - 99 mg/dL    Gen no apparent distress. Vital signs reviewed.  HEENT: Normocephalic, atraumatic. Cardio: RRR. No JVD   Resp: CTA Bilaterally. Normal effort  GI: BS positive and ND Musc/Skel:  No edema, no tenderness Neuro:  Motor LUE: Shoulder abduction 3+/5, elbow flex/ext 1+/5, wrist/hand 1/5 LLE: HF, KE 4/5, ADF 1/5 Skin:   Intact. Warm and dry.   Assessment/Plan: 1. Functional deficits secondary to Right Brainstem pontine and medullary infarcts which require 3+ hours per day of interdisciplinary therapy in a comprehensive inpatient rehab setting. Physiatrist is providing close team supervision and 24 hour management of active medical problems listed below. Physiatrist and rehab team continue to assess barriers to discharge/monitor patient progress toward functional and medical goals. FIM: Function - Bathing Position: Shower Body parts bathed by patient: Left arm,  Chest, Abdomen, Front perineal area, Right upper leg, Left upper leg Body parts bathed by helper: Right lower leg, Left lower leg, Buttocks, Right arm, Back Assist Level: Touching or steadying assistance(Pt > 75%)  Function- Upper Body Dressing/Undressing What is the patient wearing?: Pull over shirt/dress Pull over shirt/dress - Perfomed by patient: Thread/unthread right sleeve Pull over shirt/dress - Perfomed by helper: Thread/unthread left sleeve, Put head through opening, Pull shirt over trunk Assist Level: Touching or steadying assistance(Pt > 75%) Function - Lower Body Dressing/Undressing What is the patient wearing?: Pants, Non-skid slipper socks Position: Wheelchair/chair at  sink Pants- Performed by patient: Thread/unthread left pants leg Pants- Performed by helper: Thread/unthread right pants leg, Pull pants up/down Non-skid slipper socks- Performed by helper: Don/doff left sock, Don/doff right sock Socks - Performed by helper: Don/doff right sock, Don/doff left sock Shoes - Performed by helper: Don/doff right shoe, Don/doff left shoe, Fasten right, Fasten left Assist for footwear: Maximal assist Assist for lower body dressing: Touching or steadying assistance (Pt > 75%)  Function - Toileting Toileting activity did not occur: (No BM's, used urinal) Toileting steps completed by patient: Adjust clothing prior to toileting, Performs perineal hygiene Toileting steps completed by helper: Adjust clothing after toileting Toileting Assistive Devices: Grab bar or rail Assist level: Touching or steadying assistance (Pt.75%)  Function - Air cabin crew transfer activity did not occur: Safety/medical concerns Toilet transfer assistive device: Grab bar, Elevated toilet seat/BSC over toilet Mechanical lift: Stedy Assist level to toilet: Touching or steadying assistance (Pt > 75%) Assist level from toilet: Touching or steadying assistance (Pt > 75%)  Function - Chair/bed transfer Chair/bed transfer activity did not occur: N/A Chair/bed transfer method: Stand pivot Chair/bed transfer assist level: Touching or steadying assistance (Pt > 75%) Chair/bed transfer assistive device: Walker, Armrests Chair/bed transfer details: Verbal cues for sequencing  Function - Locomotion: Wheelchair Type: Manual Max wheelchair distance: 1102ft  Assist Level: Supervision or verbal cues Assist Level: Supervision or verbal cues Wheel 150 feet activity did not occur: Safety/medical concerns Assist Level: Supervision or verbal cues Turns around,maneuvers to table,bed, and toilet,negotiates 3% grade,maneuvers on rugs and over doorsills: No Function - Locomotion:  Ambulation Assistive device: Walker-hemi Max distance: 30 Assist level: Moderate assist (Pt 50 - 74%) Assist level: Moderate assist (Pt 50 - 74%) Walk 50 feet with 2 turns activity did not occur: Safety/medical concerns Walk 150 feet activity did not occur: Safety/medical concerns Walk 10 feet on uneven surfaces activity did not occur: Safety/medical concerns  Function - Comprehension Comprehension: Auditory Comprehension assist level: Understands basic 90% of the time/cues < 10% of the time  Function - Expression Expression: Verbal Expression assist level: Expresses complex 90% of the time/cues < 10% of the time  Function - Social Interaction Social Interaction assist level: Interacts appropriately 90% of the time - Needs monitoring or encouragement for participation or interaction.  Function - Problem Solving Problem solving assist level: Solves complex 90% of the time/cues < 10% of the time  Function - Memory Memory assist level: Recognizes or recalls 90% of the time/requires cueing < 10% of the time Patient normally able to recall (first 3 days only): Current season, Location of own room, Staff names and faces, That he or she is in a hospital  Medical Problem List and Plan: 1.Left side weakness/dysphagiasecondary to right paramedian brainstem infarct/pons/Medullaas well as history of calcified chronic brain mass: onset 11/30, ASA 325mg  per day   Cont CIR   Notes reviewed  WHO/PRAFO ordered  Fluoxetine  10 started on 12/17 2. DVT Prophylaxis/Anticoagulation: Subcutaneous Lovenox. Monitor for any bleeding episodes 3. Pain Management:Tylenol as needed 4. Mood:Provide emotional support 5. Neuropsych: This patientiscapable of making decisions on hisown behalf. 6. Skin/Wound Care:Routine skin checks 7. Fluids/Electrolytes/Nutrition:Routine I&O's 8.Hypertension. Norvasc 10 mg daily, Coreg 25 mg twice a day, Avapro 300 mg daily. Monitor with increased  mobility  Vitals:   12/28/16 1356 12/29/16 0414  BP: 128/71 (!) 143/84  Pulse: 86 80  Resp: 18 18  Temp: 98.3 F (36.8 C) 98.2 F (36.8 C)  SpO2: 98% 96%   Relatively controlled on 12/17 9.Hyperlipidemia. Lipitor 10.Diabetes mellitus peripheral neuropathy. Hemoglobin A1c 6.2. SSI. Check blood sugars before meals and at bedtime. Patient on Janumet-XR100-1000 milligrams daily. Resume as needed CBG (last 3)  Recent Labs    12/28/16 1632 12/28/16 2051 12/29/16 0644  GLUCAP 105* 206* 113*    Labile on 12/17 11.  Hx rectal cancer with chemo and radiation in remission 12.  Spastic bladder: improving with oxybutnin suspect CVA related spastic bladder   LOS (Days) 11 A FACE TO FACE EVALUATION WAS PERFORMED  Piotr Christopher Lorie Phenix 12/29/2016, 9:22 AM

## 2016-12-29 NOTE — Progress Notes (Signed)
Orthopedic Tech Progress Note Patient Details:  [name] 06-13-1965 [id number]  Patient ID: [name], male   DOB: 10-30-1965, 51 y.o.   MRN: [id number]   Hildred Priest 12/29/2016, 11:24 AM Called in hanger brace order; spoke with [facility]

## 2016-12-29 NOTE — Progress Notes (Signed)
Physical Therapy Session Note  Patient Details  Name: [name] MRN: [id number] Date of Birth: 10-Jul-1965  Today's Date: 12/29/2016 PT Individual Time: 1303-1330 PT Individual Time Calculation (min): 27 min   Short Term Goals: Week 2:  PT Short Term Goal 1 (Week 2): Pt will transfer with min assist to [facility] PT Short Term Goal 2 (Week 2): Pt will perform all bed mobility with supervision assist  PT Short Term Goal 3 (Week 2): Pt will ambulate 45f with mod assist and LRAD  PT Short Term Goal 4 (Week 2): Pt will ascend 1 step with mod assist and LRD   Skilled Therapeutic Interventions/Progress Updates: Pt presented in w/c agreeable to therapy. Session focused on L NMR via forced use. Pt participated in seated AROM and resisted ROM including LAQ, hip flexion, HS pulls in sitting. All performed 2 x 10 bilaterally. Once leg rests removed pt would intermittently slide in w/c with PTA providing verbal cues for positioning. Pt received and provided edu on night splints during session. Session ended with pt remaining in w/c with call bell within place and needs met.      Therapy Documentation Precautions:  Precautions Precautions: Fall Precaution Comments: L hemi Restrictions Weight Bearing Restrictions: No General:   Vital Signs: Therapy Vitals Temp: 98.8 F (37.1 C) Temp Source: Oral Pulse Rate: 82 Resp: 16 BP: 135/85 Patient Position (if appropriate): Sitting Oxygen Therapy SpO2: 100 % O2 Device: Not Delivered Pain: Pain Assessment Pain Assessment: No/denies pain  See Function Navigator for Current Functional Status.   Therapy/Group: Individual Therapy  Deandrea Vanpelt  Maryclaire Stoecker, PTA  12/29/2016, 4:08 PM

## 2016-12-29 NOTE — Progress Notes (Signed)
Occupational Therapy Session Note  Patient Details  Name: [name] MRN: [id number] Date of Birth: 04-23-1965  Today's Date: 12/29/2016 OT Individual Time: 2924-4628 OT Individual Time Calculation (min): 42 min    Short Term Goals: Week 1:  OT Short Term Goal 1 (Week 1): Pt will don shirt using hemi-techniques with min A OT Short Term Goal 1 - Progress (Week 1): Met OT Short Term Goal 2 (Week 1): Pt will demonstrate proficiency with self ROM of LUE with min cues or less. OT Short Term Goal 2 - Progress (Week 1): Met OT Short Term Goal 3 (Week 1): Pt will complete squat-pivot toilet transfer with mod A OT Short Term Goal 3 - Progress (Week 1): Met Week 2:  OT Short Term Goal 1 (Week 2): Pt will complete toiet transfer with min A OT Short Term Goal 2 (Week 2): Pt will complete 1/3 toileting steps OT Short Term Goal 3 (Week 2): Pt will maitain standing balance with 75% close supervision while performing grooming task  Skilled Therapeutic Interventions/Progress Updates:    Upon entering the room, pt seated in wheelchair awaiting therapist with no c/o pain. Pt propelled wheelchair into bathroom for min A stand pivot transfer onto TTB. Pt bathing self with bath mit and overall min A to wash B feet and lower legs. Pt transferred back to wheelchair in same manner and donned underwear by crossing ankle to opposite knee. Pt standing with min A for balance to pull over B hips. Pt donning L sock with one handed technique but required assistance for R foot. OT recommended trying figure four sitting on EOB next session to don. Pt seated in wheelchair at sink for grooming at overall mod I level. Pt remained seated in wheelchair with call bell and all needed items within reach upon exiting the room.   Therapy Documentation Precautions:  Precautions Precautions: Fall Precaution Comments: L hemi Restrictions Weight Bearing Restrictions: No General:   Vital Signs:  Pain:   ADL: ADL ADL  Comments: Please see functional navigato Vision   Perception    Praxis   Exercises:   Other Treatments:    See Function Navigator for Current Functional Status.   Therapy/Group: Individual Therapy  Gypsy Decant 12/29/2016, 12:50 PM

## 2016-12-30 ENCOUNTER — Inpatient Hospital Stay (HOSPITAL_COMMUNITY): Payer: Self-pay | Admitting: Occupational Therapy

## 2016-12-30 ENCOUNTER — Inpatient Hospital Stay (HOSPITAL_COMMUNITY): Payer: Self-pay | Admitting: Physical Therapy

## 2016-12-30 LAB — GLUCOSE, CAPILLARY
GLUCOSE-CAPILLARY: 151 mg/dL — AB (ref 65–99)
Glucose-Capillary: 116 mg/dL — ABNORMAL HIGH (ref 65–99)
Glucose-Capillary: 103 mg/dL — ABNORMAL HIGH (ref 65–99)
Glucose-Capillary: 86 mg/dL (ref 65–99)

## 2016-12-30 NOTE — Progress Notes (Signed)
Physical Therapy Session Note  Patient Details  Name: [name] MRN: [id number] Date of Birth: 1965/12/17  Today's Date: 12/30/2016 PT Individual Time: 1530-1630 PT Individual Time Calculation (min): 60 min   Short Term Goals: Week 2:  PT Short Term Goal 1 (Week 2): Pt will transfer with min assist to [facility] PT Short Term Goal 2 (Week 2): Pt will perform all bed mobility with supervision assist  PT Short Term Goal 3 (Week 2): Pt will ambulate 4f with mod assist and LRAD  PT Short Term Goal 4 (Week 2): Pt will ascend 1 step with mod assist and LRD   Skilled Therapeutic Interventions/Progress Updates:    pt with no c/o pain.  Session focus on NMR, activity tolerance, and gait with RW.    Pt propels w/c to and from therapy gym with supervision for mobility and activity tolerance.  Stand/pivot transfers throughout session with close supervision and min verbal cues for foot placement and to push up with RUE.  Pt completes bed transfer in therapy apartment with close supervision<>steady assist and verbal cues for safety.  Nustep x10 minutes with BLEs and RUE for reciprocal stepping pattern retraining, strengthening, and neutral LLE positioning.  Gait 2x40' with RW, min<>mod assist for walker management and cues for L step length.   NMR for LLE rotation and strengthening as follows:   - 2x10 reps in supine IR against gravity  - 2x10 reps in hook lying IR against gravity  - 2x10 reps in R side lying clamshells against resistance in each direction   Pt returned to room at end of session and positioned upright in w/c with call bell in reach and needs met.   Therapy Documentation Precautions:  Precautions Precautions: Fall Precaution Comments: L hemi Restrictions Weight Bearing Restrictions: No   See Function Navigator for Current Functional Status.   Therapy/Group: Individual Therapy  CMichel Santee12/18/2018, 4:32 PM

## 2016-12-30 NOTE — Progress Notes (Signed)
Subjective/Complaints: Patient seen sitting up in bed this morning eating breakfast. He states he slept well overnight. He denies any issues with his braces.  ROS: Denies nausea, vomiting, diarrhea, shortness of breath or chest pain   Objective: Vital Signs: Blood pressure (!) 147/90, pulse 92, temperature 99.6 F (37.6 C), temperature source Oral, resp. rate 16, height 5\' 3"  (1.6 m), weight 86.2 kg (190 lb), SpO2 100 %. No results found. Results for orders placed or performed during the hospital encounter of 12/18/16 (from the past 72 hour(s))  Glucose, capillary     Status: None   Collection Time: 12/27/16 12:14 PM  Result Value Ref Range   Glucose-Capillary 97 65 - 99 mg/dL  Glucose, capillary     Status: Abnormal   Collection Time: 12/27/16  4:41 PM  Result Value Ref Range   Glucose-Capillary 109 (H) 65 - 99 mg/dL  Glucose, capillary     Status: Abnormal   Collection Time: 12/27/16  8:52 PM  Result Value Ref Range   Glucose-Capillary 134 (H) 65 - 99 mg/dL  Glucose, capillary     Status: Abnormal   Collection Time: 12/28/16  6:57 AM  Result Value Ref Range   Glucose-Capillary 129 (H) 65 - 99 mg/dL  Glucose, capillary     Status: Abnormal   Collection Time: 12/28/16 11:36 AM  Result Value Ref Range   Glucose-Capillary 174 (H) 65 - 99 mg/dL  Glucose, capillary     Status: Abnormal   Collection Time: 12/28/16  4:32 PM  Result Value Ref Range   Glucose-Capillary 105 (H) 65 - 99 mg/dL  Glucose, capillary     Status: Abnormal   Collection Time: 12/28/16  8:51 PM  Result Value Ref Range   Glucose-Capillary 206 (H) 65 - 99 mg/dL  Glucose, capillary     Status: Abnormal   Collection Time: 12/29/16  6:44 AM  Result Value Ref Range   Glucose-Capillary 113 (H) 65 - 99 mg/dL  Glucose, capillary     Status: Abnormal   Collection Time: 12/29/16 11:40 AM  Result Value Ref Range   Glucose-Capillary 130 (H) 65 - 99 mg/dL  Glucose, capillary     Status: None   Collection Time:  12/29/16  4:23 PM  Result Value Ref Range   Glucose-Capillary 87 65 - 99 mg/dL  Glucose, capillary     Status: Abnormal   Collection Time: 12/29/16  9:06 PM  Result Value Ref Range   Glucose-Capillary 118 (H) 65 - 99 mg/dL  Glucose, capillary     Status: Abnormal   Collection Time: 12/30/16  6:33 AM  Result Value Ref Range   Glucose-Capillary 116 (H) 65 - 99 mg/dL    Gen no apparent distress. Vital signs reviewed.  HEENT: Normocephalic, atraumatic. Cardio: RRR. No JVD   Resp: CTA Bilaterally. Normal effort  GI: BS positive and ND Musc/Skel:  No edema, no tenderness Neuro:  Motor LUE: Shoulder abduction 3+/5, elbow flex/ext 1+/5, wrist/hand 1/5 LLE: HF, KE 4/5, ADF 1/5 Sensation intact to light touch Skin:   Intact. Warm and dry.   Assessment/Plan: 1. Functional deficits secondary to Right Brainstem pontine and medullary infarcts which require 3+ hours per day of interdisciplinary therapy in a comprehensive inpatient rehab setting. Physiatrist is providing close team supervision and 24 hour management of active medical problems listed below. Physiatrist and rehab team continue to assess barriers to discharge/monitor patient progress toward functional and medical goals. FIM: Function - Bathing Position: Shower Body parts bathed by patient: Left  arm, Chest, Abdomen, Front perineal area, Right upper leg, Left upper leg, Buttocks Body parts bathed by helper: Right arm, Right lower leg, Left lower leg, Back Assist Level: Touching or steadying assistance(Pt > 75%)  Function- Upper Body Dressing/Undressing What is the patient wearing?: Hospital gown Pull over shirt/dress - Perfomed by patient: Thread/unthread right sleeve Pull over shirt/dress - Perfomed by helper: Thread/unthread left sleeve, Put head through opening, Pull shirt over trunk Assist Level: Set up Set up : To obtain clothing/put away Function - Lower Body Dressing/Undressing What is the patient wearing?: Pants,  Non-skid slipper socks Position: Wheelchair/chair at sink Pants- Performed by patient: Thread/unthread left pants leg, Thread/unthread right pants leg, Pull pants up/down Pants- Performed by helper: Thread/unthread right pants leg, Pull pants up/down Non-skid slipper socks- Performed by patient: Don/doff left sock Non-skid slipper socks- Performed by helper: Don/doff right sock Socks - Performed by helper: Don/doff right sock, Don/doff left sock Shoes - Performed by helper: Don/doff right shoe, Don/doff left shoe, Fasten right, Fasten left Assist for footwear: Partial/moderate assist Assist for lower body dressing: Touching or steadying assistance (Pt > 75%)  Function - Toileting Toileting activity did not occur: (No BM's, used urinal) Toileting steps completed by patient: Adjust clothing prior to toileting, Performs perineal hygiene Toileting steps completed by helper: Adjust clothing after toileting Toileting Assistive Devices: Grab bar or rail Assist level: Touching or steadying assistance (Pt.75%)  Function - Air cabin crew transfer activity did not occur: Safety/medical concerns Toilet transfer assistive device: Grab bar, Elevated toilet seat/BSC over toilet Mechanical lift: Stedy Assist level to toilet: Touching or steadying assistance (Pt > 75%) Assist level from toilet: Touching or steadying assistance (Pt > 75%)  Function - Chair/bed transfer Chair/bed transfer activity did not occur: N/A Chair/bed transfer method: Squat pivot Chair/bed transfer assist level: Touching or steadying assistance (Pt > 75%) Chair/bed transfer assistive device: Armrests Chair/bed transfer details: Verbal cues for sequencing  Function - Locomotion: Wheelchair Type: Manual Max wheelchair distance: 166ft  Assist Level: Supervision or verbal cues Assist Level: Supervision or verbal cues Wheel 150 feet activity did not occur: Safety/medical concerns Assist Level: Supervision or verbal  cues Turns around,maneuvers to table,bed, and toilet,negotiates 3% grade,maneuvers on rugs and over doorsills: No Function - Locomotion: Ambulation Assistive device: Walker-rolling, Orthosis Max distance: 30 ft Assist level: Touching or steadying assistance (Pt > 75%) Assist level: Touching or steadying assistance (Pt > 75%) Walk 50 feet with 2 turns activity did not occur: Safety/medical concerns Walk 150 feet activity did not occur: Safety/medical concerns Walk 10 feet on uneven surfaces activity did not occur: Safety/medical concerns  Function - Comprehension Comprehension: Auditory Comprehension assist level: Understands basic 90% of the time/cues < 10% of the time  Function - Expression Expression: Verbal Expression assist level: Expresses complex 90% of the time/cues < 10% of the time  Function - Social Interaction Social Interaction assist level: Interacts appropriately 90% of the time - Needs monitoring or encouragement for participation or interaction.  Function - Problem Solving Problem solving assist level: Solves complex 90% of the time/cues < 10% of the time  Function - Memory Memory assist level: Recognizes or recalls 90% of the time/requires cueing < 10% of the time Patient normally able to recall (first 3 days only): Current season, Location of own room, Staff names and faces, That he or she is in a hospital  Medical Problem List and Plan: 1.Left side weakness/dysphagiasecondary to right paramedian brainstem infarct/pons/Medullaas well as history of calcified chronic brain mass:  onset 11/30, ASA 325mg  per day   Cont CIR   WHO/PRAFO, ROM  Fluoxetine 10 started on 12/17 2. DVT Prophylaxis/Anticoagulation: Subcutaneous Lovenox. Monitor for any bleeding episodes 3. Pain Management:Tylenol as needed 4. Mood:Provide emotional support 5. Neuropsych: This patientiscapable of making decisions on hisown behalf. 6. Skin/Wound Care:Routine skin checks 7.  Fluids/Electrolytes/Nutrition:Routine I&O's 8.Hypertension. Norvasc 10 mg daily, Coreg 25 mg twice a day, Avapro 300 mg daily. Monitor with increased mobility  Vitals:   12/29/16 1407 12/30/16 0552  BP: 135/85 (!) 147/90  Pulse: 82 92  Resp: 16 16  Temp: 98.8 F (37.1 C) 99.6 F (37.6 C)  SpO2: 100% 100%   Slightly elevated this morning, otherwise relatively controlled 9.Hyperlipidemia. Lipitor 10.Diabetes mellitus peripheral neuropathy. Hemoglobin A1c 6.2. SSI. Check blood sugars before meals and at bedtime. Patient on Janumet-XR100-1000 milligrams daily. Resume as needed CBG (last 3)  Recent Labs    12/29/16 1623 12/29/16 2106 12/30/16 0633  GLUCAP 87 118* 116*    Relatively controlled on 12/18 11.  Hx rectal cancer with chemo and radiation in remission 12.  Spastic bladder: improving with oxybutnin suspect CVA related spastic bladder   LOS (Days) 12 A FACE TO FACE EVALUATION WAS PERFORMED  Kadeem Hyle Lorie Phenix 12/30/2016, 8:39 AM

## 2016-12-30 NOTE — Progress Notes (Signed)
Physical Therapy Session Note  Patient Details  Name: [name] MRN: [id number] Date of Birth: 1965-11-09  Today's Date: 12/30/2016 PT Individual Time: 0900-1000 PT Individual Time Calculation (min): 60 min   Short Term Goals: Week 2:  PT Short Term Goal 1 (Week 2): Pt will transfer with min assist to [facility] PT Short Term Goal 2 (Week 2): Pt will perform all bed mobility with supervision assist  PT Short Term Goal 3 (Week 2): Pt will ambulate 59f with mod assist and LRAD  PT Short Term Goal 4 (Week 2): Pt will ascend 1 step with mod assist and LRD   Skilled Therapeutic Interventions/Progress Updates: Pt presented in w/c eating snack agreeable to therapy. PTA donned shoes and AFO for time management. Pt propelled hemi technique to rehab gym with supervision. Discussed w/c bed transfer at home, per pt has high bed (approx 26-27in) pt will be unable to perform squat pivot to that height. Discussed progression to possible stand pivot to bed. Performed squat pivot to mat with pt verbalizing instruction prior to transfer.Pt able to verbalize safety cues including "scoot back once sitting in bed" without prompts.  Performed sit to/from stand with cues for increasing wt shift to L. Performed alternating steps with PTA manually facilitating wt shift to L 2 x 10. Gait training 225fx 1 with RW and modA with verbal cues for heel strike, and minA for decreased external rotation. Performed stand pivot with RW standard chair to w/c with modA and verbal cues for sequencing. Pt propelled back to room with supervision and noted improved awareness of positioning in chair with self correction noted. Pt remained in w/c at end of session with call bell within reach and current needs met.      Therapy Documentation Precautions:  Precautions Precautions: Fall Precaution Comments: L hemi Restrictions Weight Bearing Restrictions: No General:   Vital Signs: Therapy Vitals Pulse Rate: 88 BP: (!)  150/88   See Function Navigator for Current Functional Status.   Therapy/Group: Individual Therapy  Delisa Finck  Crucita Lacorte, PTA  12/30/2016, 12:30 PM

## 2016-12-30 NOTE — Progress Notes (Signed)
Occupational Therapy Session Note  Patient Details  Name: [name] MRN: [id number] Date of Birth: 08-17-1965  Today's Date: 12/30/2016 OT Individual Time: 1100-1215 OT Individual Time Calculation (min): 75 min    Short Term Goals: Week 2:  OT Short Term Goal 1 (Week 2): Pt will complete toiet transfer with min A OT Short Term Goal 2 (Week 2): Pt will complete 1/3 toileting steps OT Short Term Goal 3 (Week 2): Pt will maitain standing balance with 75% close supervision while performing grooming task  Skilled Therapeutic Interventions/Progress Updates:    Pt seen for OT session focusing on functional transfers and ADL/IADL re-training. Pt received sitting in w/c, agreeable to tx session. He delcined bathing/dressing this session.  He self propelled w/c throughout unit using hemi technique with supervision. In ADL apartment, he ambulated short distance from doorway to tub/shower conbination and completed simulated tub/shower transfer utilizing tub transfer bench. Mod A required for ambulation due to L LE weakness/ poor awareness of foot positioning and for RW management in functional context. Tub/bench transfers completed with guarding assist, pt able to manage B LEs over tub wall independently. In ADL kitchen, practiced kitchen mobility and accessibility from w/c level in prep for d/c home at mod I w/c level during day while wife is at work. Completed with supervision with VCs for problem solving set-up and accessibility, practicing transporting items and standing to access items from cabinet level.  Pt then practiced w/c <> bed transfers on ADL apartment bed in simulation of home environment, completed stand/squat pivot transfer initially min A progressing to supervision on second trial, pt able to go EOB <> supine mod I.  Discussed pt requiring more practice before d/c if goal is to do this at mod I level, pt voiced understanding and agreement. Pt returned to room at end of session,  left set-up with meal tray and all needs in reach.  Pt provided with w/c home measurement sheet in prep for d/c home at w/c level.   Therapy Documentation Precautions:  Precautions Precautions: Fall Precaution Comments: L hemi Restrictions Weight Bearing Restrictions: No Pain:   No/ denies pain ADL: ADL ADL Comments: Please see functional navigato  See Function Navigator for Current Functional Status.   Therapy/Group: Individual Therapy  Mehkai Gallo L 12/30/2016, 7:12 AM

## 2016-12-31 ENCOUNTER — Inpatient Hospital Stay (HOSPITAL_COMMUNITY): Payer: Self-pay | Admitting: Physical Therapy

## 2016-12-31 ENCOUNTER — Inpatient Hospital Stay (HOSPITAL_COMMUNITY): Payer: Self-pay | Admitting: Occupational Therapy

## 2016-12-31 LAB — GLUCOSE, CAPILLARY
GLUCOSE-CAPILLARY: 113 mg/dL — AB (ref 65–99)
GLUCOSE-CAPILLARY: 89 mg/dL (ref 65–99)
Glucose-Capillary: 116 mg/dL — ABNORMAL HIGH (ref 65–99)
Glucose-Capillary: 137 mg/dL — ABNORMAL HIGH (ref 65–99)

## 2016-12-31 NOTE — Progress Notes (Signed)
Physical Therapy Session Note  Patient Details  Name: [name] MRN: [id number] Date of Birth: Aug 24, 1965  Today's Date: 12/31/2016 PT Individual Time: 1300-1415 PT Individual Time Calculation (min): 75 min   Short Term Goals: Week 2:  PT Short Term Goal 1 (Week 2): Pt will transfer with min assist to [facility] PT Short Term Goal 2 (Week 2): Pt will perform all bed mobility with supervision assist  PT Short Term Goal 3 (Week 2): Pt will ambulate 36f with mod assist and LRAD  PT Short Term Goal 4 (Week 2): Pt will ascend 1 step with mod assist and LRD   Skilled Therapeutic Interventions/Progress Updates:    no c/o pain.  Session focus on orthotics consult, gait training, and NMR for LLE coordination.    Pt propels w/c to and from therapy gym with R hemi technique, min verbal cues for scooting back in chair, but supervision overall.    Pt reporting pain in posterior heel/ankle on L with AFO, so trialed different types for improved tolerance and gait pattern.  Pt ambulates x20' +20' + 80' + 10' for orthotics consult with RW and range from min>mod assist.  Continues to have difficulty with walker management when advancing LLE.    NMR for LLE with ball squeezes and hip flexion in sitting with 2.5# ankle weight 2x10 reps each.  Pt returned to room at end of session and positioned upright in w/c with call bell in reach and needs met.   Therapy Documentation Precautions:  Precautions Precautions: Fall Precaution Comments: L hemi Restrictions Weight Bearing Restrictions: No   See Function Navigator for Current Functional Status.   Therapy/Group: Individual Therapy  CMichel Santee12/19/2018, 4:41 PM

## 2016-12-31 NOTE — Progress Notes (Signed)
Occupational Therapy Session Note  Patient Details  Name: [name] MRN: [id number] Date of Birth: 11-Sep-1965  Today's Date: 12/31/2016 OT Individual Time: 0805-0900 OT Individual Time Calculation (min): 55 min    Short Term Goals: Week 2:  OT Short Term Goal 1 (Week 2): Pt will complete toiet transfer with min A OT Short Term Goal 2 (Week 2): Pt will complete 1/3 toileting steps OT Short Term Goal 3 (Week 2): Pt will maitain standing balance with 75% close supervision while performing grooming task  Skilled Therapeutic Interventions/Progress Updates:    OT treatment session focused on modified bathing/dressing and functional use of L UE. Practiced bed mobility from flat hospital bed simulated to home environment. Pt needed min A to elevate trunk without rail, but could complete w/ supervision using bed rail. Wc set-up at EOB, stand-pivot to weaker L side with close supervision and intermittent steady A for ensure balance. Stand-pivot onto tub bench using grab bars and supervision. Pt needed assist to don wash mit, then integrate L UE into bathing tasks for NMR.Pt  able to achieve figure 4 with min A to wash feet. Stood with supervision and L UE supported on grab bar, then able to reach and wash buttocks. Worked on modified strategies to don pants as pt's L ankle slips when placed on R knee. Tried adding a wash cloth under ankle with improved stability. Standing at the sink while facilitating weight bearing through L UE while OT braided pt hair.    Therapy Documentation  See Function Navigator for Current Functional Status.   Therapy/Group: Individual Therapy  Valma Cava 12/31/2016, 8:26 AM

## 2016-12-31 NOTE — Patient Care Conference (Signed)
Inpatient RehabilitationTeam Conference and Plan of Care Update Date: 12/31/2016   Time: 11:20 Am    Patient Name: [name]      Medical Record Number: [id number]  Date of Birth: [birth year] Sex: Male         Room/Bed: 4M06C/4M06C-01 Payor Info: Payor: MEDICAID PENDING / Plan: MEDICAID PENDING / Product Type: *No Product type* /    Admitting Diagnosis: R CVA  Admit Date/Time:  12/18/2016  4:37 PM Admission Comments: No comment available   Primary Diagnosis:  Left hemiparesis ([place]) Principal Problem: Left hemiparesis [facility])  Patient Active Problem List   Diagnosis Date Noted  . Benign essential HTN   . Neurogenic bladder due to old stroke 12/25/2016  . Right pontine cerebrovascular accident ([place]) 12/18/2016  . Left hemiparesis ([place])   . Diastolic dysfunction   . Dysphagia, post-stroke   . AKI (acute kidney injury) ([place])   . Prediabetes   . Acute ischemic stroke ([place]) 12/12/2016  . Acute respiratory failure with hypercapnia ([place])   . Seizures ([place]) 07/22/2014  . Hypertension 07/22/2014  . Hyperglycemia 07/22/2014  . Type 2 diabetes mellitus with vascular disease ([place]) 07/22/2014  . Pyrexia   . Brain mass   . Malignant neoplasm of rectum ([place]) 09/02/2007    Expected Discharge Date: Expected Discharge Date: 01/08/17  Team Members Present: Physician leading conference: Dr. Delice Lesch Social Worker Present: Ovidio Kin, LCSW Nurse Present: Leonette Nutting, RN PT Present: Dwyane Dee, PT OT Present: Cherylynn Ridges, OT SLP Present: Charolett Bumpers, SLP PPS Coordinator present : Daiva Nakayama, RN, CRRN     Current Status/Progress Goal Weekly Team Focus  Medical   Left side weakness/dysphagia secondary to right paramedian brainstem infarct/pons/Medulla as well as history of calcified chronic brain mass: onset 11/30  Improve mobility, urinary incontinence  See above   Bowel/Bladder   continent of  bowel and bladder can be incontinent at times of bladder, LBM  12-29-16  continent of bowel and bladder regular bowel pattern  Assist with toileting needs q shift and prn   Swallow/Nutrition/ Hydration             ADL's   Min A overall  Mod I /supervision   L NMR, NMES, functional tansfes, standing balance, atcivity tolerance, pt/family education   Mobility   close supervision for stand/pivot, min/mod for gait  mod I transfers and w/c mobility, supervision for gait with LRAD  LLE NMR, balance, activity tolerance, gait with LRAD   Communication             Safety/Cognition/ Behavioral Observations            Pain   no c/o pain  no pain  Assess pain q shift and prn   Skin   CDI  CDI  Assess skin q shift and prn      *See Care Plan and progress notes for long and short-term goals.     Barriers to Discharge  Current Status/Progress Possible Resolutions Date Resolved   Physician    Inaccessible home environment;Decreased caregiver support;Medical stability     See above  Therapies, bladder incontinence improving, follow CBGs/BP      Nursing                  PT                    OT                  SLP  SW                Discharge Planning/Teaching Needs:  Plan home with wife who can assist in the evenings. Needs to be mod/i wheelchair level to be safe at home during the day while wife works.      Team Discussion:  Progressing toward his supervision-mod/i level. Balance is better and BP is stable and DM is stable. Bladder improving on meds. Getting return in right shoulder. DC SP last week met goals Wife to measure doorways at home and come in for training. Will be safe to be home alone while wife works if stays in wheelchair.  Revisions to Treatment Plan:  DC 12/27    Continued Need for Acute Rehabilitation Level of Care: The patient requires daily medical management by a physician with specialized training in physical medicine and rehabilitation for the following conditions: Daily direction of a multidisciplinary  physical rehabilitation program to ensure safe treatment while eliciting the highest outcome that is of practical value to the patient.: Yes Daily medical management of patient stability for increased activity during participation in an intensive rehabilitation regime.: Yes Daily analysis of laboratory values and/or radiology reports with any subsequent need for medication adjustment of medical intervention for : Neurological problems;Urological problems  Anessa Charley, Gardiner Rhyme 12/31/2016, 2:13 PM

## 2016-12-31 NOTE — Progress Notes (Signed)
Subjective/Complaints: Patient seen sitting up in bed this morning eating breakfast. He states he slept well overnight. He denies complaints.  ROS: Denies nausea, vomiting, diarrhea, shortness of breath or chest pain   Objective: Vital Signs: Blood pressure 131/85, pulse 75, temperature 98.3 F (36.8 C), temperature source Oral, resp. rate 16, height 5\' 3"  (1.6 m), weight 86 kg (189 lb 9.5 oz), SpO2 100 %. No results found. Results for orders placed or performed during the hospital encounter of 12/18/16 (from the past 72 hour(s))  Glucose, capillary     Status: Abnormal   Collection Time: 12/28/16 11:36 AM  Result Value Ref Range   Glucose-Capillary 174 (H) 65 - 99 mg/dL  Glucose, capillary     Status: Abnormal   Collection Time: 12/28/16  4:32 PM  Result Value Ref Range   Glucose-Capillary 105 (H) 65 - 99 mg/dL  Glucose, capillary     Status: Abnormal   Collection Time: 12/28/16  8:51 PM  Result Value Ref Range   Glucose-Capillary 206 (H) 65 - 99 mg/dL  Glucose, capillary     Status: Abnormal   Collection Time: 12/29/16  6:44 AM  Result Value Ref Range   Glucose-Capillary 113 (H) 65 - 99 mg/dL  Glucose, capillary     Status: Abnormal   Collection Time: 12/29/16 11:40 AM  Result Value Ref Range   Glucose-Capillary 130 (H) 65 - 99 mg/dL  Glucose, capillary     Status: None   Collection Time: 12/29/16  4:23 PM  Result Value Ref Range   Glucose-Capillary 87 65 - 99 mg/dL  Glucose, capillary     Status: Abnormal   Collection Time: 12/29/16  9:06 PM  Result Value Ref Range   Glucose-Capillary 118 (H) 65 - 99 mg/dL  Glucose, capillary     Status: Abnormal   Collection Time: 12/30/16  6:33 AM  Result Value Ref Range   Glucose-Capillary 116 (H) 65 - 99 mg/dL  Glucose, capillary     Status: Abnormal   Collection Time: 12/30/16 12:33 PM  Result Value Ref Range   Glucose-Capillary 103 (H) 65 - 99 mg/dL  Glucose, capillary     Status: None   Collection Time: 12/30/16  4:33 PM   Result Value Ref Range   Glucose-Capillary 86 65 - 99 mg/dL  Glucose, capillary     Status: Abnormal   Collection Time: 12/30/16  8:50 PM  Result Value Ref Range   Glucose-Capillary 151 (H) 65 - 99 mg/dL  Glucose, capillary     Status: Abnormal   Collection Time: 12/31/16  6:33 AM  Result Value Ref Range   Glucose-Capillary 116 (H) 65 - 99 mg/dL    Gen no apparent distress. Vital signs reviewed.  HEENT: Normocephalic, atraumatic. Cardio: RRR. No JVD   Resp: CTA Bilaterally. Normal effort  GI: BS positive and ND Musc/Skel:  No edema, no tenderness Neuro:  Motor LUE: Shoulder abduction 3+/5, elbow flex/ext 1+/5, wrist/hand 1/5 LLE: HF, KE 4/5, ADF 1+/5 Sensation intact to light touch Skin:   Intact. Warm and dry.   Assessment/Plan: 1. Functional deficits secondary to Right Brainstem pontine and medullary infarcts which require 3+ hours per day of interdisciplinary therapy in a comprehensive inpatient rehab setting. Physiatrist is providing close team supervision and 24 hour management of active medical problems listed below. Physiatrist and rehab team continue to assess barriers to discharge/monitor patient progress toward functional and medical goals. FIM: Function - Bathing Position: Shower Body parts bathed by patient: Left arm, Chest, Abdomen,  Front perineal area, Right upper leg, Left upper leg, Buttocks, Right arm, Right lower leg, Left lower leg Body parts bathed by helper: Right arm, Right lower leg, Left lower leg, Back Assist Level: Supervision or verbal cues  Function- Upper Body Dressing/Undressing What is the patient wearing?: Pull over shirt/dress Pull over shirt/dress - Perfomed by patient: Thread/unthread right sleeve, Thread/unthread left sleeve, Put head through opening, Pull shirt over trunk Pull over shirt/dress - Perfomed by helper: Thread/unthread left sleeve, Put head through opening, Pull shirt over trunk Assist Level: Supervision or verbal cues Set up :  To obtain clothing/put away Function - Lower Body Dressing/Undressing What is the patient wearing?: Pants, Non-skid slipper socks Position: Wheelchair/chair at sink Pants- Performed by patient: Thread/unthread right pants leg, Thread/unthread left pants leg, Pull pants up/down Pants- Performed by helper: Thread/unthread right pants leg, Pull pants up/down Non-skid slipper socks- Performed by patient: Don/doff right sock, Don/doff left sock Non-skid slipper socks- Performed by helper: Don/doff right sock Socks - Performed by helper: Don/doff right sock, Don/doff left sock Shoes - Performed by helper: Don/doff right shoe, Don/doff left shoe, Fasten right, Fasten left Assist for footwear: Supervision/touching assist Assist for lower body dressing: Supervision or verbal cues  Function - Toileting Toileting activity did not occur: (No BM's, used urinal) Toileting steps completed by patient: Adjust clothing prior to toileting, Performs perineal hygiene Toileting steps completed by helper: Adjust clothing after toileting Toileting Assistive Devices: Grab bar or rail Assist level: Touching or steadying assistance (Pt.75%)  Function - Air cabin crew transfer activity did not occur: Safety/medical concerns Toilet transfer assistive device: Grab bar, Elevated toilet seat/BSC over toilet Mechanical lift: Stedy Assist level to toilet: Touching or steadying assistance (Pt > 75%) Assist level from toilet: Touching or steadying assistance (Pt > 75%)  Function - Chair/bed transfer Chair/bed transfer activity did not occur: N/A Chair/bed transfer method: Stand pivot Chair/bed transfer assist level: Supervision or verbal cues Chair/bed transfer assistive device: Armrests, Orthosis Chair/bed transfer details: Verbal cues for sequencing  Function - Locomotion: Wheelchair Type: Manual Max wheelchair distance: 141ft  Assist Level: Supervision or verbal cues Assist Level: Supervision or verbal  cues Wheel 150 feet activity did not occur: Safety/medical concerns Assist Level: Supervision or verbal cues Turns around,maneuvers to table,bed, and toilet,negotiates 3% grade,maneuvers on rugs and over doorsills: No Function - Locomotion: Ambulation Assistive device: Walker-rolling Max distance: 40 Assist level: Touching or steadying assistance (Pt > 75%) Assist level: Touching or steadying assistance (Pt > 75%) Walk 50 feet with 2 turns activity did not occur: Safety/medical concerns Walk 150 feet activity did not occur: Safety/medical concerns Walk 10 feet on uneven surfaces activity did not occur: Safety/medical concerns  Function - Comprehension Comprehension: Auditory Comprehension assist level: Understands basic 90% of the time/cues < 10% of the time  Function - Expression Expression: Verbal Expression assist level: Expresses complex 90% of the time/cues < 10% of the time  Function - Social Interaction Social Interaction assist level: Interacts appropriately 90% of the time - Needs monitoring or encouragement for participation or interaction.  Function - Problem Solving Problem solving assist level: Solves complex 90% of the time/cues < 10% of the time  Function - Memory Memory assist level: Recognizes or recalls 90% of the time/requires cueing < 10% of the time Patient normally able to recall (first 3 days only): Current season, Location of own room, Staff names and faces, That he or she is in a hospital  Medical Problem List and Plan: 1.Left side weakness/dysphagiasecondary  to right paramedian brainstem infarct/pons/Medullaas well as history of calcified chronic brain mass: onset 11/30, ASA 325mg  per day   Cont CIR   WHO/PRAFO, ROM  Fluoxetine 10 started on 12/17 2. DVT Prophylaxis/Anticoagulation: Subcutaneous Lovenox. Monitor for any bleeding episodes 3. Pain Management:Tylenol as needed 4. Mood:Provide emotional support 5. Neuropsych: This patientiscapable  of making decisions on hisown behalf. 6. Skin/Wound Care:Routine skin checks 7. Fluids/Electrolytes/Nutrition:Routine I&O's   Labs ordered for tomorrow 8.Hypertension. Norvasc 10 mg daily, Coreg 25 mg twice a day, Avapro 300 mg daily. Monitor with increased mobility  Vitals:   12/30/16 1452 12/31/16 0221  BP: 127/78 131/85  Pulse: 82 75  Resp: 18 16  Temp: 98.7 F (37.1 C) 98.3 F (36.8 C)  SpO2: 100% 100%   Controlled on 12/19 9.Hyperlipidemia. Lipitor 10.Diabetes mellitus peripheral neuropathy. Hemoglobin A1c 6.2. SSI. Check blood sugars before meals and at bedtime. Patient on Janumet-XR100-1000 milligrams daily. Resume as needed CBG (last 3)  Recent Labs    12/30/16 1633 12/30/16 2050 12/31/16 0633  GLUCAP 86 151* 116*    Relatively controlled on 12/19 11.  Hx rectal cancer with chemo and radiation in remission 12.  Spastic bladder: improving with oxybutnin suspect CVA related spastic bladder   LOS (Days) 13 A FACE TO FACE EVALUATION WAS PERFORMED  Ankit Lorie Phenix 12/31/2016, 8:27 AM

## 2016-12-31 NOTE — Progress Notes (Addendum)
Occupational Therapy Session Note  Patient Details  Name: [name] MRN: [id number] Date of Birth: [birth year]  Today's Date: 12/31/2016 OT Individual Time: 2831-5176 OT Individual Time Calculation (min): 57 min    Short Term Goals: Week 2:  OT Short Term Goal 1 (Week 2): Pt will complete toiet transfer with min A OT Short Term Goal 2 (Week 2): Pt will complete 1/3 toileting steps OT Short Term Goal 3 (Week 2): Pt will maitain standing balance with 75% close supervision while performing grooming task  Skilled Therapeutic Interventions/Progress Updates:    Pt presented sitting up in w/c ready for OT tx session. Pt self propels w/c during session using hemi technique, intermittent verbal cues for avoiding obstacles to the L. Pt propels to therapy gym. Completes standing activity facilitating wt bearing through LUE during game of checkers; MinGuard for maintaining standing balance with RUE support, intermittent MinA without RUE support. Pt stands approx 7 min prior to needing seated rest break. Pt completed squat pivot transfer w/c>mat table to the L with ModA. Further focus on LUE implementing AA/PROM and use of ranger with [facility] assist and manual facilitation for implementing proper body mechanics. Pt returns to w/c with MinA for squat pivot to the R, returns to room in manner described above, transfer to EOB with MinA where Pt was left supine in bed, call bell and needs within reach.   Therapy Documentation Precautions:  Precautions Precautions: Fall Precaution Comments: L hemi Restrictions Weight Bearing Restrictions: No   ADL: ADL ADL Comments: Please see functional navigato  See Function Navigator for Current Functional Status.   Therapy/Group: Individual Therapy  Raymondo Band 12/31/2016, 12:46 PM

## 2016-12-31 NOTE — Progress Notes (Signed)
Orthopedic Tech Progress Note Patient Details:  [name] 17-Oct-1965 [id number]  Patient ID: [name], male   DOB: 06-13-1965, 51 y.o.   MRN: [id number]   [name] 12/31/2016, 2:02 PMCalled [facility] for left  Resting hand splint , Cockup splint and PRAFO.

## 2017-01-01 ENCOUNTER — Inpatient Hospital Stay (HOSPITAL_COMMUNITY): Payer: Self-pay | Admitting: Physical Therapy

## 2017-01-01 ENCOUNTER — Inpatient Hospital Stay (HOSPITAL_COMMUNITY): Payer: Self-pay | Admitting: Occupational Therapy

## 2017-01-01 DIAGNOSIS — D62 Acute posthemorrhagic anemia: Secondary | ICD-10-CM

## 2017-01-01 LAB — CBC WITH DIFFERENTIAL/PLATELET
BASOS PCT: 0 %
Basophils Absolute: 0 10*3/uL (ref 0.0–0.1)
EOS ABS: 0.1 10*3/uL (ref 0.0–0.7)
Eosinophils Relative: 3 %
HEMATOCRIT: 35.5 % — AB (ref 39.0–52.0)
HEMOGLOBIN: 11.5 g/dL — AB (ref 13.0–17.0)
Lymphocytes Relative: 25 %
Lymphs Abs: 1.1 10*3/uL (ref 0.7–4.0)
MCH: 24.6 pg — ABNORMAL LOW (ref 26.0–34.0)
MCHC: 32.4 g/dL (ref 30.0–36.0)
MCV: 76 fL — ABNORMAL LOW (ref 78.0–100.0)
Monocytes Absolute: 0.3 10*3/uL (ref 0.1–1.0)
Monocytes Relative: 7 %
NEUTROS ABS: 2.9 10*3/uL (ref 1.7–7.7)
NEUTROS PCT: 65 %
Platelets: 259 10*3/uL (ref 150–400)
RBC: 4.67 MIL/uL (ref 4.22–5.81)
RDW: 15.3 % (ref 11.5–15.5)
WBC: 4.5 10*3/uL (ref 4.0–10.5)

## 2017-01-01 LAB — BASIC METABOLIC PANEL
ANION GAP: 8 (ref 5–15)
BUN: 12 mg/dL (ref 6–20)
CALCIUM: 9.4 mg/dL (ref 8.9–10.3)
CO2: 25 mmol/L (ref 22–32)
Chloride: 108 mmol/L (ref 101–111)
Creatinine, Ser: 1 mg/dL (ref 0.61–1.24)
Glucose, Bld: 115 mg/dL — ABNORMAL HIGH (ref 65–99)
Potassium: 4 mmol/L (ref 3.5–5.1)
SODIUM: 141 mmol/L (ref 135–145)

## 2017-01-01 LAB — GLUCOSE, CAPILLARY
GLUCOSE-CAPILLARY: 102 mg/dL — AB (ref 65–99)
GLUCOSE-CAPILLARY: 78 mg/dL (ref 65–99)
Glucose-Capillary: 90 mg/dL (ref 65–99)

## 2017-01-01 NOTE — Progress Notes (Signed)
Physical Therapy Session Note  Patient Details  Name: [name] MRN: [id number] Date of Birth: 1965/10/10  Today's Date: 01/01/2017 PT Individual Time: 1100-1200 and 1300-1400 PT Individual Time Calculation (min): 60 min and 60 min   Short Term Goals: Week 2:  PT Short Term Goal 1 (Week 2): Pt will transfer with min assist to [place] PT Short Term Goal 2 (Week 2): Pt will perform all bed mobility with supervision assist  PT Short Term Goal 3 (Week 2): Pt will ambulate 46f with mod assist and LRAD  PT Short Term Goal 4 (Week 2): Pt will ascend 1 step with mod assist and LRD   Skilled Therapeutic Interventions/Progress Updates:    Session 1: No c/o pain, session focus on NMR and pre-gait for weight shifting and L knee control.    Pt propels w/c to and from therapy gym with R hemi technique with supervision, min verbal cues initially for scooting back in chair, but by end of session pt adjusts self without cues. NMR in // bars with forward/retro stepping to a target focus on weight shifting and knee control in stance phase on L.  Minisquats in standing focus on knee control on L 2x10 reps.  Seated therex for NMR with 3# ankle weights bilaterally, LAQ, hip flexion, hip adduction with level 3 theraband, and standing hip abduction 2x10 reps each.  In standing PT required to block pt's L knee and provide mod assist for weight shift in order for pt to unload/abduction RLE.    Pt returned to room at end of session and positioned with call bell in reach and needs met.   Session 2: Wife present for family education.  PT provided hands on training for basic transfers in/out of w/c from car and bed.  Also provided extensive verbal education on ambulation with RW only to be performed with assist from family member initially at discharge, plan for mod I w/c level mobility, and began discussion regarding access to bathroom and steps to enter.  Orthotist in at end of session to provide modified shoe with  heel lift and AFO.  Pt able to ambulate with min guard and RW 2x20' with shoe modifications/AFO.  Pt returned to room at end of session and positioned with call bell in reach and needs met.   Therapy Documentation Precautions:  Precautions Precautions: Fall Precaution Comments: L hemi Restrictions Weight Bearing Restrictions: No   See Function Navigator for Current Functional Status.   Therapy/Group: Individual Therapy  CMichel Santee12/20/2018, 3:15 PM

## 2017-01-01 NOTE — Progress Notes (Signed)
Social Work Patient ID: [name], male   DOB: 06/15/1965, 51 y.o.   MRN: [id number]  Met with pt to discuss team conference and progression toward his goals and target still 12/27. Have left a message for his wife at home regarding same information. Both pleased with his progress and feel he is doing well. He feels he will be ready to go home next week. Wife to come in when off to go through therapies with pt in preparation for discharge.

## 2017-01-01 NOTE — IPOC Note (Deleted)
Please see previous IPOC. Since that time patient has had cervical decompression.

## 2017-01-01 NOTE — Progress Notes (Signed)
Occupational Therapy Session Note  Patient Details  Name: [name] MRN: [id number] Date of Birth: Apr 15, 1965  Today's Date: 01/01/2017 OT Individual Time: 0802-0930 OT Individual Time Calculation (min): 88 min    Short Term Goals: Week 2:  OT Short Term Goal 1 (Week 2): Pt will complete toiet transfer with min A OT Short Term Goal 2 (Week 2): Pt will complete 1/3 toileting steps OT Short Term Goal 3 (Week 2): Pt will maitain standing balance with 75% close supervision while performing grooming task  Skilled Therapeutic Interventions/Progress Updates:    OT treatment session focused on modified bathing/dressing, standing balance, L NMR, and NMES. Pt transferred bed>wc with close supervision and min cues for LLE placement prior to weight shifting. Reviewed wc placement for toilet transfers and pt able to position wc with min cues. Stand-pivot using grab bars with steady assist, then pt needed assist to doff brief. Pt with successful BM and completed peri-care without assist. Similar transfer back to wc, then to tub bench. Bathing completed with min A overall to wash lower legs. L UE NMR while washing body using wash mit. Pt needed assistance to wash and condition hair 2/2 time management. Reviewed LB dressing strategies using long handled reacher and pt able to thread pants and underwear, then pull pants up with L UE stabilizing at sink and close supervision for balance. Standing grooming tasks with OT facilitating weight bearing through L UE. 1:1 NMES applied to wrist extensors. Ratio 1:1 Rate 35 pps Waveform- Asymmetric Ramp 1.0 Pulse 300 Intensity- 27 Duration -  10  No adverse reactions after treatment and is skin intact.    Therapy Documentation Precautions:  Precautions Precautions: Fall Precaution Comments: L hemi Restrictions Weight Bearing Restrictions: No  See Function Navigator for Current Functional Status.   Therapy/Group: Individual Therapy  Valma Cava 01/01/2017, 9:07 AM

## 2017-01-02 ENCOUNTER — Inpatient Hospital Stay (HOSPITAL_COMMUNITY): Payer: Self-pay | Admitting: Occupational Therapy

## 2017-01-02 ENCOUNTER — Inpatient Hospital Stay (HOSPITAL_COMMUNITY): Payer: Self-pay | Admitting: Physical Therapy

## 2017-01-02 DIAGNOSIS — D62 Acute posthemorrhagic anemia: Secondary | ICD-10-CM

## 2017-01-02 LAB — GLUCOSE, CAPILLARY
GLUCOSE-CAPILLARY: 103 mg/dL — AB (ref 65–99)
GLUCOSE-CAPILLARY: 90 mg/dL (ref 65–99)
GLUCOSE-CAPILLARY: 114 mg/dL — AB (ref 65–99)
Glucose-Capillary: 143 mg/dL — ABNORMAL HIGH (ref 65–99)

## 2017-01-02 NOTE — Progress Notes (Signed)
Subjective/Complaints: Pt seen sitting up in bed this AM.  He slept well overnight.  He notes improvement in strength.   ROS: Denies nausea, vomiting, diarrhea, shortness of breath or chest pain   Objective: Vital Signs: Blood pressure (!) 150/88, pulse 89, temperature 98.8 F (37.1 C), temperature source Oral, resp. rate 16, height 5' 3"  (1.6 m), weight 86 kg (189 lb 9.5 oz), SpO2 98 %. No results found. Results for orders placed or performed during the hospital encounter of 12/18/16 (from the past 72 hour(s))  Glucose, capillary     Status: Abnormal   Collection Time: 12/30/16 12:33 PM  Result Value Ref Range   Glucose-Capillary 103 (H) 65 - 99 mg/dL  Glucose, capillary     Status: None   Collection Time: 12/30/16  4:33 PM  Result Value Ref Range   Glucose-Capillary 86 65 - 99 mg/dL  Glucose, capillary     Status: Abnormal   Collection Time: 12/30/16  8:50 PM  Result Value Ref Range   Glucose-Capillary 151 (H) 65 - 99 mg/dL  Glucose, capillary     Status: Abnormal   Collection Time: 12/31/16  6:33 AM  Result Value Ref Range   Glucose-Capillary 116 (H) 65 - 99 mg/dL  Glucose, capillary     Status: Abnormal   Collection Time: 12/31/16 11:09 AM  Result Value Ref Range   Glucose-Capillary 113 (H) 65 - 99 mg/dL  Glucose, capillary     Status: None   Collection Time: 12/31/16  4:59 PM  Result Value Ref Range   Glucose-Capillary 89 65 - 99 mg/dL  Glucose, capillary     Status: Abnormal   Collection Time: 12/31/16  8:50 PM  Result Value Ref Range   Glucose-Capillary 137 (H) 65 - 99 mg/dL  Basic metabolic panel     Status: Abnormal   Collection Time: 01/01/17  6:24 AM  Result Value Ref Range   Sodium 141 135 - 145 mmol/L   Potassium 4.0 3.5 - 5.1 mmol/L   Chloride 108 101 - 111 mmol/L   CO2 25 22 - 32 mmol/L   Glucose, Bld 115 (H) 65 - 99 mg/dL   BUN 12 6 - 20 mg/dL   Creatinine, Ser 1.00 0.61 - 1.24 mg/dL   Calcium 9.4 8.9 - 10.3 mg/dL   GFR calc non Af Amer >60 >60  mL/min   GFR calc Af Amer >60 >60 mL/min    Comment: (NOTE) The eGFR has been calculated using the CKD EPI equation. This calculation has not been validated in all clinical situations. eGFR's persistently <60 mL/min signify possible Chronic Kidney Disease.    Anion gap 8 5 - 15  CBC with Differential/Platelet     Status: Abnormal   Collection Time: 01/01/17  6:24 AM  Result Value Ref Range   WBC 4.5 4.0 - 10.5 K/uL   RBC 4.67 4.22 - 5.81 MIL/uL   Hemoglobin 11.5 (L) 13.0 - 17.0 g/dL   HCT 35.5 (L) 39.0 - 52.0 %   MCV 76.0 (L) 78.0 - 100.0 fL   MCH 24.6 (L) 26.0 - 34.0 pg   MCHC 32.4 30.0 - 36.0 g/dL   RDW 15.3 11.5 - 15.5 %   Platelets 259 150 - 400 K/uL   Neutrophils Relative % 65 %   Neutro Abs 2.9 1.7 - 7.7 K/uL   Lymphocytes Relative 25 %   Lymphs Abs 1.1 0.7 - 4.0 K/uL   Monocytes Relative 7 %   Monocytes Absolute 0.3 0.1 - 1.0  K/uL   Eosinophils Relative 3 %   Eosinophils Absolute 0.1 0.0 - 0.7 K/uL   Basophils Relative 0 %   Basophils Absolute 0.0 0.0 - 0.1 K/uL  Glucose, capillary     Status: Abnormal   Collection Time: 01/01/17  6:24 AM  Result Value Ref Range   Glucose-Capillary 102 (H) 65 - 99 mg/dL  Glucose, capillary     Status: None   Collection Time: 01/01/17 12:04 PM  Result Value Ref Range   Glucose-Capillary 78 65 - 99 mg/dL   Comment 1 Notify RN   Glucose, capillary     Status: None   Collection Time: 01/01/17  5:27 PM  Result Value Ref Range   Glucose-Capillary 90 65 - 99 mg/dL  Glucose, capillary     Status: Abnormal   Collection Time: 01/02/17  6:45 AM  Result Value Ref Range   Glucose-Capillary 103 (H) 65 - 99 mg/dL    Gen no apparent distress. Vital signs reviewed.  HEENT: Normocephalic, atraumatic. Cardio: RRR. No JVD   Resp: CTA Bilaterally. Normal effort  GI: BS positive and ND Musc/Skel:  No edema, no tenderness Neuro:  Motor LUE: Shoulder abduction 2+/5, 2-/5 elbow flex/ext, 1/5 wrist/hand  LLE: HF, KE 3/5, ADF 1+/5  Sensation  intact to light touch Skin:   Intact. Warm and dry.   Assessment/Plan: 1. Functional deficits secondary to Right Brainstem pontine and medullary infarcts which require 3+ hours per day of interdisciplinary therapy in a comprehensive inpatient rehab setting. Physiatrist is providing close team supervision and 24 hour management of active medical problems listed below. Physiatrist and rehab team continue to assess barriers to discharge/monitor patient progress toward functional and medical goals. FIM: Function - Bathing Position: Shower Body parts bathed by patient: Left arm, Chest, Abdomen, Front perineal area, Right upper leg, Left upper leg, Buttocks, Right arm, Right lower leg, Left lower leg Body parts bathed by helper: Right arm, Right lower leg, Left lower leg, Back Assist Level: Supervision or verbal cues  Function- Upper Body Dressing/Undressing What is the patient wearing?: Pull over shirt/dress Pull over shirt/dress - Perfomed by patient: Thread/unthread right sleeve, Thread/unthread left sleeve, Put head through opening, Pull shirt over trunk Pull over shirt/dress - Perfomed by helper: Thread/unthread left sleeve, Put head through opening, Pull shirt over trunk Assist Level: Supervision or verbal cues Set up : To obtain clothing/put away Function - Lower Body Dressing/Undressing What is the patient wearing?: Pants, Non-skid slipper socks Position: Wheelchair/chair at sink Pants- Performed by patient: Thread/unthread right pants leg, Thread/unthread left pants leg, Pull pants up/down Pants- Performed by helper: Thread/unthread right pants leg, Pull pants up/down Non-skid slipper socks- Performed by patient: Don/doff right sock, Don/doff left sock Non-skid slipper socks- Performed by helper: Don/doff right sock Socks - Performed by helper: Don/doff right sock, Don/doff left sock Shoes - Performed by helper: Don/doff right shoe, Don/doff left shoe, Fasten right, Fasten left Assist  for footwear: Supervision/touching assist Assist for lower body dressing: Supervision or verbal cues  Function - Toileting Toileting activity did not occur: (No BM's, used urinal) Toileting steps completed by patient: Adjust clothing prior to toileting, Performs perineal hygiene Toileting steps completed by helper: Adjust clothing after toileting Toileting Assistive Devices: Grab bar or rail Assist level: Touching or steadying assistance (Pt.75%)  Function - Air cabin crew transfer activity did not occur: Safety/medical concerns Toilet transfer assistive device: Grab bar, Elevated toilet seat/BSC over toilet Mechanical lift: Stedy Assist level to toilet: Touching or steadying assistance (Pt >  75%) Assist level from toilet: Touching or steadying assistance (Pt > 75%)  Function - Chair/bed transfer Chair/bed transfer activity did not occur: N/A Chair/bed transfer method: Stand pivot Chair/bed transfer assist level: Supervision or verbal cues Chair/bed transfer assistive device: Armrests, Orthosis Chair/bed transfer details: Verbal cues for sequencing  Function - Locomotion: Wheelchair Type: Manual Max wheelchair distance: 164f  Assist Level: Supervision or verbal cues Assist Level: Supervision or verbal cues Wheel 150 feet activity did not occur: Safety/medical concerns Assist Level: Supervision or verbal cues Turns around,maneuvers to table,bed, and toilet,negotiates 3% grade,maneuvers on rugs and over doorsills: No Function - Locomotion: Ambulation Assistive device: Walker-rolling Max distance: 40 Assist level: Touching or steadying assistance (Pt > 75%) Assist level: Touching or steadying assistance (Pt > 75%) Walk 50 feet with 2 turns activity did not occur: Safety/medical concerns Walk 150 feet activity did not occur: Safety/medical concerns Walk 10 feet on uneven surfaces activity did not occur: Safety/medical concerns  Function - Comprehension Comprehension:  Auditory Comprehension assist level: Understands basic 90% of the time/cues < 10% of the time  Function - Expression Expression: Verbal Expression assist level: Expresses complex 90% of the time/cues < 10% of the time  Function - Social Interaction Social Interaction assist level: Interacts appropriately 90% of the time - Needs monitoring or encouragement for participation or interaction.  Function - Problem Solving Problem solving assist level: Solves complex 90% of the time/cues < 10% of the time  Function - Memory Memory assist level: Recognizes or recalls 90% of the time/requires cueing < 10% of the time Patient normally able to recall (first 3 days only): Current season, Location of own room, Staff names and faces, That he or she is in a hospital  Medical Problem List and Plan: 1.Left side weakness/dysphagiasecondary to right paramedian brainstem infarct/pons/Medullaas well as history of calcified chronic brain mass: onset 11/30, ASA 3223mper day   Cont CIR   WHO/PRAFO, ROM  Fluoxetine 10 started on 12/17 2. DVT Prophylaxis/Anticoagulation: Subcutaneous Lovenox. Monitor for any bleeding episodes 3. Pain Management:Tylenol as needed 4. Mood:Provide emotional support 5. Neuropsych: This patientiscapable of making decisions on hisown behalf. 6. Skin/Wound Care:Routine skin checks 7. Fluids/Electrolytes/Nutrition:Routine I&O's   BMP within acceptable range on 12/20 8.Hypertension. Norvasc 10 mg daily, Coreg 25 mg twice a day, Avapro 300 mg daily. Monitor with increased mobility  Vitals:   01/01/17 1410 01/02/17 0500  BP: 122/75 (!) 150/88  Pulse: 76 89  Resp: 16 16  Temp: 98.1 F (36.7 C) 98.8 F (37.1 C)  SpO2: 97% 98%   Elevated this AM, otherwise relatively controlled on 12/21 9.Hyperlipidemia. Lipitor 10.Diabetes mellitus peripheral neuropathy. Hemoglobin A1c 6.2. SSI. Check blood sugars before meals and at bedtime. Patient on Janumet-XR100-1000  milligrams daily. Resume as needed CBG (last 3)  Recent Labs    01/01/17 1204 01/01/17 1727 01/02/17 0645  GLUCAP 78 90 103*    Relatively controlled on 12/21 11.  Hx rectal cancer with chemo and radiation in remission 12.  Spastic bladder: improving with oxybutnin suspect CVA related spastic bladder  Slow improvement 13. ABLA  Hb 11.5 on 12/20   Cont to monitor   LOS (Days) 15 A FACE TO FACE EVALUATION WAS PERFORMED  Kam Kushnir AnLorie Phenix2/21/2018, 9:52 AM

## 2017-01-02 NOTE — Progress Notes (Signed)
Physical Therapy Session Note  Patient Details  Name: [name] MRN: [id number] Date of Birth: 12-06-1965  Today's Date: 01/02/2017 PT Individual Time: 1100-1200 PT Individual Time Calculation (min): 60 min   Short Term Goals: Week 2:  PT Short Term Goal 1 (Week 2): Pt will transfer with min assist to [facility] PT Short Term Goal 2 (Week 2): Pt will perform all bed mobility with supervision assist  PT Short Term Goal 3 (Week 2): Pt will ambulate 58ft with mod assist and LRAD  PT Short Term Goal 4 (Week 2): Pt will ascend 1 step with mod assist and LRD   Skilled Therapeutic Interventions/Progress Updates:    no c/o pain.  Session focus on trunk NMR and gait with RW.    Pt propels w/c to and from therapy gym mod I with R hemi technique.  Sit<>stand throughout session with supervision.  Gait training x80' + 41' with seated rest break in between.  PT providing min assist for first 30', mod for remaining distance.  Pt requires mod cues for maintaining upright posture, and neutral LLE rotation for improved positioning during stance phase.    NMR on therapy mat for L hip abduction strengthening 2x10 reps with therapist stabilizing R side of body to prevent compensatory strategies.  Attempted lateral trunk flexion in hook lying but pt unable to dissociate pelvis from trunk to perform this exercise effectively.  Transition to sitting with min guard, PROM lateral trunk flexion stretch to L 3x30 seconds.  Pt positioned with wedge underneath L hip to promote neutral trunk alignment and towel roll placed underneath cushion for same.  PT provided tactile facilitation at L forearm for supination/pronation x5 reps in each direction.  Pt returned to room at end of session mod I.   Therapy Documentation Precautions:  Precautions Precautions: Fall Precaution Comments: L hemi Restrictions Weight Bearing Restrictions: No   See Function Navigator for Current Functional Status.   Therapy/Group:  Individual Therapy  [name] 01/02/2017, 12:09 PM

## 2017-01-02 NOTE — Progress Notes (Signed)
Social Work Patient ID: [name], male   DOB: Oct 22, 1965, 51 y.o.   MRN: [id number]  Met with wife yesterday when where to discuss team conference and need for family training next week. She will be off next week and will be here to learn pt's care. Have faxed information to [employer]orah [employer]s case worker information for Medicaid and disability. Work on discharge needs.

## 2017-01-02 NOTE — Progress Notes (Signed)
Subjective/Complaints: Patient seen sitting up in bed this Am, eating breakfast.  He slept well overnight.  He denies complaints.   ROS: Denies nausea, vomiting, diarrhea, shortness of breath or chest pain   Objective: Vital Signs: Blood pressure (!) 150/88, pulse 89, temperature 98.8 F (37.1 C), temperature source Oral, resp. rate 16, height 5' 3"  (1.6 m), weight 86 kg (189 lb 9.5 oz), SpO2 98 %. No results found. Results for orders placed or performed during the hospital encounter of 12/18/16 (from the past 72 hour(s))  Glucose, capillary     Status: Abnormal   Collection Time: 12/30/16 12:33 PM  Result Value Ref Range   Glucose-Capillary 103 (H) 65 - 99 mg/dL  Glucose, capillary     Status: None   Collection Time: 12/30/16  4:33 PM  Result Value Ref Range   Glucose-Capillary 86 65 - 99 mg/dL  Glucose, capillary     Status: Abnormal   Collection Time: 12/30/16  8:50 PM  Result Value Ref Range   Glucose-Capillary 151 (H) 65 - 99 mg/dL  Glucose, capillary     Status: Abnormal   Collection Time: 12/31/16  6:33 AM  Result Value Ref Range   Glucose-Capillary 116 (H) 65 - 99 mg/dL  Glucose, capillary     Status: Abnormal   Collection Time: 12/31/16 11:09 AM  Result Value Ref Range   Glucose-Capillary 113 (H) 65 - 99 mg/dL  Glucose, capillary     Status: None   Collection Time: 12/31/16  4:59 PM  Result Value Ref Range   Glucose-Capillary 89 65 - 99 mg/dL  Glucose, capillary     Status: Abnormal   Collection Time: 12/31/16  8:50 PM  Result Value Ref Range   Glucose-Capillary 137 (H) 65 - 99 mg/dL  Basic metabolic panel     Status: Abnormal   Collection Time: 01/01/17  6:24 AM  Result Value Ref Range   Sodium 141 135 - 145 mmol/L   Potassium 4.0 3.5 - 5.1 mmol/L   Chloride 108 101 - 111 mmol/L   CO2 25 22 - 32 mmol/L   Glucose, Bld 115 (H) 65 - 99 mg/dL   BUN 12 6 - 20 mg/dL   Creatinine, Ser 1.00 0.61 - 1.24 mg/dL   Calcium 9.4 8.9 - 10.3 mg/dL   GFR calc non Af Amer  >60 >60 mL/min   GFR calc Af Amer >60 >60 mL/min    Comment: (NOTE) The eGFR has been calculated using the CKD EPI equation. This calculation has not been validated in all clinical situations. eGFR's persistently <60 mL/min signify possible Chronic Kidney Disease.    Anion gap 8 5 - 15  CBC with Differential/Platelet     Status: Abnormal   Collection Time: 01/01/17  6:24 AM  Result Value Ref Range   WBC 4.5 4.0 - 10.5 K/uL   RBC 4.67 4.22 - 5.81 MIL/uL   Hemoglobin 11.5 (L) 13.0 - 17.0 g/dL   HCT 35.5 (L) 39.0 - 52.0 %   MCV 76.0 (L) 78.0 - 100.0 fL   MCH 24.6 (L) 26.0 - 34.0 pg   MCHC 32.4 30.0 - 36.0 g/dL   RDW 15.3 11.5 - 15.5 %   Platelets 259 150 - 400 K/uL   Neutrophils Relative % 65 %   Neutro Abs 2.9 1.7 - 7.7 K/uL   Lymphocytes Relative 25 %   Lymphs Abs 1.1 0.7 - 4.0 K/uL   Monocytes Relative 7 %   Monocytes Absolute 0.3 0.1 - 1.0  K/uL   Eosinophils Relative 3 %   Eosinophils Absolute 0.1 0.0 - 0.7 K/uL   Basophils Relative 0 %   Basophils Absolute 0.0 0.0 - 0.1 K/uL  Glucose, capillary     Status: Abnormal   Collection Time: 01/01/17  6:24 AM  Result Value Ref Range   Glucose-Capillary 102 (H) 65 - 99 mg/dL  Glucose, capillary     Status: None   Collection Time: 01/01/17 12:04 PM  Result Value Ref Range   Glucose-Capillary 78 65 - 99 mg/dL   Comment 1 Notify RN   Glucose, capillary     Status: None   Collection Time: 01/01/17  5:27 PM  Result Value Ref Range   Glucose-Capillary 90 65 - 99 mg/dL  Glucose, capillary     Status: Abnormal   Collection Time: 01/02/17  6:45 AM  Result Value Ref Range   Glucose-Capillary 103 (H) 65 - 99 mg/dL    Gen no apparent distress. Vital signs reviewed.  HEENT: Normocephalic, atraumatic. Cardio: RRR. No JVD   Resp: CTA Bilaterally. Normal effort  GI: BS positive and ND Musc/Skel:  No edema, no tenderness Neuro:  Motor LUE: Shoulder abduction 3+/5, elbow flex/ext 1+/5, wrist/hand 1/5 (stable) LLE: HF, KE 4/5, ADF 1+/5  (stable) Sensation intact to light touch Skin:   Intact. Warm and dry.   Assessment/Plan: 1. Functional deficits secondary to Right Brainstem pontine and medullary infarcts which require 3+ hours per day of interdisciplinary therapy in a comprehensive inpatient rehab setting. Physiatrist is providing close team supervision and 24 hour management of active medical problems listed below. Physiatrist and rehab team continue to assess barriers to discharge/monitor patient progress toward functional and medical goals. FIM: Function - Bathing Position: Shower Body parts bathed by patient: Left arm, Chest, Abdomen, Front perineal area, Right upper leg, Left upper leg, Buttocks, Right arm, Right lower leg, Left lower leg Body parts bathed by helper: Right arm, Right lower leg, Left lower leg, Back Assist Level: Supervision or verbal cues  Function- Upper Body Dressing/Undressing What is the patient wearing?: Pull over shirt/dress Pull over shirt/dress - Perfomed by patient: Thread/unthread right sleeve, Thread/unthread left sleeve, Put head through opening, Pull shirt over trunk Pull over shirt/dress - Perfomed by helper: Thread/unthread left sleeve, Put head through opening, Pull shirt over trunk Assist Level: Supervision or verbal cues Set up : To obtain clothing/put away Function - Lower Body Dressing/Undressing What is the patient wearing?: Pants, Non-skid slipper socks Position: Wheelchair/chair at sink Pants- Performed by patient: Thread/unthread right pants leg, Thread/unthread left pants leg, Pull pants up/down Pants- Performed by helper: Thread/unthread right pants leg, Pull pants up/down Non-skid slipper socks- Performed by patient: Don/doff right sock, Don/doff left sock Non-skid slipper socks- Performed by helper: Don/doff right sock Socks - Performed by helper: Don/doff right sock, Don/doff left sock Shoes - Performed by helper: Don/doff right shoe, Don/doff left shoe, Fasten right,  Fasten left Assist for footwear: Supervision/touching assist Assist for lower body dressing: Supervision or verbal cues  Function - Toileting Toileting activity did not occur: (No BM's, used urinal) Toileting steps completed by patient: Adjust clothing prior to toileting, Performs perineal hygiene Toileting steps completed by helper: Adjust clothing after toileting Toileting Assistive Devices: Grab bar or rail Assist level: Touching or steadying assistance (Pt.75%)  Function - Air cabin crew transfer activity did not occur: Safety/medical concerns Toilet transfer assistive device: Grab bar, Elevated toilet seat/BSC over toilet Mechanical lift: Stedy Assist level to toilet: Touching or steadying assistance (Pt >  75%) Assist level from toilet: Touching or steadying assistance (Pt > 75%)  Function - Chair/bed transfer Chair/bed transfer activity did not occur: N/A Chair/bed transfer method: Stand pivot Chair/bed transfer assist level: Supervision or verbal cues Chair/bed transfer assistive device: Armrests, Orthosis Chair/bed transfer details: Verbal cues for sequencing  Function - Locomotion: Wheelchair Type: Manual Max wheelchair distance: 116f  Assist Level: Supervision or verbal cues Assist Level: Supervision or verbal cues Wheel 150 feet activity did not occur: Safety/medical concerns Assist Level: Supervision or verbal cues Turns around,maneuvers to table,bed, and toilet,negotiates 3% grade,maneuvers on rugs and over doorsills: No Function - Locomotion: Ambulation Assistive device: Walker-rolling Max distance: 40 Assist level: Touching or steadying assistance (Pt > 75%) Assist level: Touching or steadying assistance (Pt > 75%) Walk 50 feet with 2 turns activity did not occur: Safety/medical concerns Walk 150 feet activity did not occur: Safety/medical concerns Walk 10 feet on uneven surfaces activity did not occur: Safety/medical concerns  Function -  Comprehension Comprehension: Auditory Comprehension assist level: Understands basic 90% of the time/cues < 10% of the time  Function - Expression Expression: Verbal Expression assist level: Expresses complex 90% of the time/cues < 10% of the time  Function - Social Interaction Social Interaction assist level: Interacts appropriately 90% of the time - Needs monitoring or encouragement for participation or interaction.  Function - Problem Solving Problem solving assist level: Solves complex 90% of the time/cues < 10% of the time  Function - Memory Memory assist level: Recognizes or recalls 90% of the time/requires cueing < 10% of the time Patient normally able to recall (first 3 days only): Current season, Location of own room, Staff names and faces, That he or she is in a hospital  Medical Problem List and Plan: 1.Left side weakness/dysphagiasecondary to right paramedian brainstem infarct/pons/Medullaas well as history of calcified chronic brain mass: onset 11/30, ASA 3239mper day   Cont CIR   WHO/PRAFO, ROM  Fluoxetine 10 started on 12/17 2. DVT Prophylaxis/Anticoagulation: Subcutaneous Lovenox. Monitor for any bleeding episodes 3. Pain Management:Tylenol as needed 4. Mood:Provide emotional support 5. Neuropsych: This patientiscapable of making decisions on hisown behalf. 6. Skin/Wound Care:Routine skin checks 7. Fluids/Electrolytes/Nutrition:Routine I&O's   BMP within acceptable range on 12/20 8.Hypertension. Norvasc 10 mg daily, Coreg 25 mg twice a day, Avapro 300 mg daily. Monitor with increased mobility  Vitals:   01/01/17 1410 01/02/17 0500  BP: 122/75 (!) 150/88  Pulse: 76 89  Resp: 16 16  Temp: 98.1 F (36.7 C) 98.8 F (37.1 C)  SpO2: 97% 98%   Controlled on 12/20 9.Hyperlipidemia. Lipitor 10.Diabetes mellitus peripheral neuropathy. Hemoglobin A1c 6.2. SSI. Check blood sugars before meals and at bedtime. Patient on Janumet-XR100-1000 milligrams  daily. Resume as needed CBG (last 3)  Recent Labs    01/01/17 1204 01/01/17 1727 01/02/17 0645  GLUCAP 78 90 103*    Relatively controlled on 12/20 11.  Hx rectal cancer with chemo and radiation in remission 12.  Spastic bladder: improving with oxybutnin suspect CVA related spastic bladder 13. ABLA  Hb 11.5 on 12/20   Cont to monitor   LOS (Days) 15 A FACE TO FACE EVALUATION WAS PERFORMED  Ankit AnLorie Phenix2/21/2018, 9:49 AM

## 2017-01-02 NOTE — Progress Notes (Signed)
Occupational Therapy Session Note  Patient Details  Name: [name] MRN: [id number] Date of Birth: 09-06-1965  Today's Date: 01/02/2017 OT Individual Time: 1301-1400 OT Individual Time Calculation (min): 59 min    Short Term Goals: Week 3:  OT Short Term Goal 1 (Week 3): STG=LTG 2/2 ELOS  Skilled Therapeutic Interventions/Progress Updates:    treatment session focused on ADL/self care training, transfer training, balance training, activity tolerance, and pt education/discharge planning. Therapist entered room and pt agreeable to OT session. Pt completed w/c<>bed transfer with S and v/c for hand/footplacement from R side. functional mobility in hallway with S at w/c level to rehab kitchen. Sit<>stand at countertop level to practice static balance with 1 UE support with equal WB with B LE. Therapist provided tactile and verbal cues for wide base of support. Pt tolerated standing with min A for 3 minutes x 2 during overhead reaching in to cabinets with 1 UE support on countertop op. Pt completed w/c<>TTB with  S and v/c for hand foot placement. Therapist and pt discuss home environment and simulated tub transfers. Therapist provided education on safety awareness, equipment planning, and hand/foot placement during transfers. Pt returned to room via walker and performed functional mobility at walker level with min A for L sided guiding and support. Pt left seated in w/c with call bell in reach with needs met.   Therapy Documentation Precautions:  Precautions Precautions: Fall Precaution Comments: L hemi Restrictions Weight Bearing Restrictions: No Pain: Pain Assessment Pain Assessment: No/denies pain ADL: ADL ADL Comments: Please see functional navigato    See Function Navigator for Current Functional Status.   Therapy/Group: Individual Therapy  Delon Sacramento 01/02/2017, 2:33 PM

## 2017-01-02 NOTE — Progress Notes (Signed)
Occupational Therapy Weekly Progress Note  Patient Details  Name: [name] MRN: [id number] Date of Birth: [birth year]  Beginning of progress report period: December 19, 2016 End of progress report period: January 02, 2017  Today's Date: 01/02/2017 OT Individual Time: 6010-9323 OT Individual Time Calculation (min): 74 min    Patient has met 3 of 3 short term goals.  Pt is making steady progress with OT treatments at this time.  He is able to complete all transfers with min guard assist, including toilet and shower transfers.  LUE function continues to improve with improved proximal shoulder and elbow activation. He is able to use L UE consistently as a stabilize during BADL tasks.  Feel he is on target to reach modified independent/supervision  level goals for discharge home next week.  Will continue with current OT treatment POC.     Patient continues to demonstrate the following deficits: muscle weakness, impaired timing and sequencing, abnormal tone and unbalanced muscle activation and decreased standing balance, decreased postural control, hemiplegia and decreased balance strategies and therefore will continue to benefit from skilled OT intervention to enhance overall performance with BADL.  Patient progressing toward long term goals..  Continue plan of care.  OT Short Term Goals Week 2:  OT Short Term Goal 1 (Week 2): Pt will complete toiet transfer with min A OT Short Term Goal 1 - Progress (Week 2): Met OT Short Term Goal 2 (Week 2): Pt will complete 1/3 toileting steps OT Short Term Goal 2 - Progress (Week 2): Met OT Short Term Goal 3 (Week 2): Pt will maitain standing balance with 75% close supervision while performing grooming task OT Short Term Goal 3 - Progress (Week 2): Met Week 3:  OT Short Term Goal 1 (Week 3): STG=LTG 2/2 ELOS  Skilled Therapeutic Interventions/Progress Updates:    OT treatment session focused on modified bathing/dressing, standing balance, and L  NMR. Utilized RW for stand-step turn transfer with pt needing mod A 2/2 L knee buckle w/o brace on. Stand-pivot into shower with min guard A and grab bar. Provided pt with long handled sponge for improved access to lower body bathing. Pt now able to don wash mit independently and use L UE to assist washing legs and R UE. Tried to modify technique to don socks and shoes using stool, but pt's hip flexors on L side are too tight to reach far enough to don socks. Pt was however able to done R sock using hemi-technique. Provided pt with shoe buttons and blocked practice donning shoes and AFO. Pt left seated in wc at end of session with needs met and call bell in reach.   Therapy Documentation Precautions:  Precautions Precautions: Fall Precaution Comments: L hemi Restrictions Weight Bearing Restrictions: No  See Function Navigator for Current Functional Status.   Therapy/Group: Individual Therapy  Valma Cava 01/02/2017, 10:09 AM

## 2017-01-03 ENCOUNTER — Inpatient Hospital Stay (HOSPITAL_COMMUNITY): Payer: Self-pay | Admitting: Occupational Therapy

## 2017-01-03 LAB — GLUCOSE, CAPILLARY
GLUCOSE-CAPILLARY: 102 mg/dL — AB (ref 65–99)
GLUCOSE-CAPILLARY: 200 mg/dL — AB (ref 65–99)
Glucose-Capillary: 91 mg/dL (ref 65–99)
Glucose-Capillary: 103 mg/dL — ABNORMAL HIGH (ref 65–99)

## 2017-01-03 NOTE — Progress Notes (Signed)
Occupational Therapy Session Note  Patient Details  Name: [name] MRN: [id number] Date of Birth: Jun 05, 1965  Today's Date: 01/03/2017 OT Individual Time: 0900-1001 OT Individual Time Calculation (min): 61 min    Short Term Goals: Week 3:  OT Short Term Goal 1 (Week 3): STG=LTG 2/2 ELOS  Skilled Therapeutic Interventions/Progress Updates:    Treatment session focused on ADL/self care training, transfer training, activity tolerance, balance training, NMR, and pt education. Upon entering pt supine in bed with HOB elevated and agreeable to AM ADLs. Pt completed bed mob to EOB sit with S using bedrails. Therapist provided v/c for hand/foot placement for EOB sit to w/c transfer with first attempt LOB, required min A for attempt 2 with SPT. Pt transferred to shower bench with min guard A and v/c for hand/foot placement. Therapist provided verbal instruction on using AE for bathing and required min A for donning wash  Mit. Pt required hand over hand to facility L UE to wash R UE. Pt completed UB dressing with S and LB dressing with mod A for footwear in seated/standing level. Able to loop pants and stand with min guard A for balance to pull up pants from w/c at sink. Pt required mod A for donning socks and shoes. Pt completed grooming at sink with set up A. No pain reported, pt left resting in w/c with needs met.   Therapy Documentation Precautions:  Precautions Precautions: Fall Precaution Comments: L hemi Restrictions Weight Bearing Restrictions: No   Pain: Pain Assessment Pain Assessment: No/denies pain ADL: ADL ADL Comments: Please see functional navigaton   See Function Navigator for Current Functional Status.   Therapy/Group: Individual Therapy  Delon Sacramento 01/03/2017, 12:17 PM

## 2017-01-03 NOTE — Progress Notes (Signed)
Subjective/Complaints: Since seen lying in bed this morning. He states he slept well overnight. He notes further improvement in strength.  ROS: Denies nausea, vomiting, diarrhea, shortness of breath or chest pain   Objective: Vital Signs: Blood pressure (!) 144/84, pulse 83, temperature 98.5 F (36.9 C), temperature source Oral, resp. rate 18, height 5' 3" (1.6 m), weight 86 kg (189 lb 9.5 oz), SpO2 97 %. No results found. Results for orders placed or performed during the hospital encounter of 12/18/16 (from the past 72 hour(s))  Glucose, capillary     Status: Abnormal   Collection Time: 12/31/16 11:09 AM  Result Value Ref Range   Glucose-Capillary 113 (H) 65 - 99 mg/dL  Glucose, capillary     Status: None   Collection Time: 12/31/16  4:59 PM  Result Value Ref Range   Glucose-Capillary 89 65 - 99 mg/dL  Glucose, capillary     Status: Abnormal   Collection Time: 12/31/16  8:50 PM  Result Value Ref Range   Glucose-Capillary 137 (H) 65 - 99 mg/dL  Basic metabolic panel     Status: Abnormal   Collection Time: 01/01/17  6:24 AM  Result Value Ref Range   Sodium 141 135 - 145 mmol/L   Potassium 4.0 3.5 - 5.1 mmol/L   Chloride 108 101 - 111 mmol/L   CO2 25 22 - 32 mmol/L   Glucose, Bld 115 (H) 65 - 99 mg/dL   BUN 12 6 - 20 mg/dL   Creatinine, Ser 1.00 0.61 - 1.24 mg/dL   Calcium 9.4 8.9 - 10.3 mg/dL   GFR calc non Af Amer >60 >60 mL/min   GFR calc Af Amer >60 >60 mL/min    Comment: (NOTE) The eGFR has been calculated using the CKD EPI equation. This calculation has not been validated in all clinical situations. eGFR's persistently <60 mL/min signify possible Chronic Kidney Disease.    Anion gap 8 5 - 15  CBC with Differential/Platelet     Status: Abnormal   Collection Time: 01/01/17  6:24 AM  Result Value Ref Range   WBC 4.5 4.0 - 10.5 K/uL   RBC 4.67 4.22 - 5.81 MIL/uL   Hemoglobin 11.5 (L) 13.0 - 17.0 g/dL   HCT 35.5 (L) 39.0 - 52.0 %   MCV 76.0 (L) 78.0 - 100.0 fL    MCH 24.6 (L) 26.0 - 34.0 pg   MCHC 32.4 30.0 - 36.0 g/dL   RDW 15.3 11.5 - 15.5 %   Platelets 259 150 - 400 K/uL   Neutrophils Relative % 65 %   Neutro Abs 2.9 1.7 - 7.7 K/uL   Lymphocytes Relative 25 %   Lymphs Abs 1.1 0.7 - 4.0 K/uL   Monocytes Relative 7 %   Monocytes Absolute 0.3 0.1 - 1.0 K/uL   Eosinophils Relative 3 %   Eosinophils Absolute 0.1 0.0 - 0.7 K/uL   Basophils Relative 0 %   Basophils Absolute 0.0 0.0 - 0.1 K/uL  Glucose, capillary     Status: Abnormal   Collection Time: 01/01/17  6:24 AM  Result Value Ref Range   Glucose-Capillary 102 (H) 65 - 99 mg/dL  Glucose, capillary     Status: None   Collection Time: 01/01/17 12:04 PM  Result Value Ref Range   Glucose-Capillary 78 65 - 99 mg/dL   Comment 1 Notify RN   Glucose, capillary     Status: None   Collection Time: 01/01/17  5:27 PM  Result Value Ref Range   Glucose-Capillary  90 65 - 99 mg/dL  Glucose, capillary     Status: Abnormal   Collection Time: 01/02/17  6:45 AM  Result Value Ref Range   Glucose-Capillary 103 (H) 65 - 99 mg/dL  Glucose, capillary     Status: None   Collection Time: 01/02/17 11:58 AM  Result Value Ref Range   Glucose-Capillary 90 65 - 99 mg/dL  Glucose, capillary     Status: Abnormal   Collection Time: 01/02/17  4:37 PM  Result Value Ref Range   Glucose-Capillary 114 (H) 65 - 99 mg/dL  Glucose, capillary     Status: Abnormal   Collection Time: 01/02/17  8:46 PM  Result Value Ref Range   Glucose-Capillary 143 (H) 65 - 99 mg/dL  Glucose, capillary     Status: None   Collection Time: 01/03/17  6:38 AM  Result Value Ref Range   Glucose-Capillary 91 65 - 99 mg/dL    Gen no apparent distress. Vital signs reviewed.  HEENT: Normocephalic, atraumatic. Cardio: RRR. No JVD   Resp: CTA Bilaterally. Normal effort  GI: BS positive and ND Musc/Skel:  No edema, no tenderness Neuro:  Motor LUE: Shoulder abduction 3/5, 2-/5 elbow flex/ext, 1/5 wrist/hand  LLE: HF, KE 3/5, ADF 1+/5   Sensation intact to light touch Skin:   Intact. Warm and dry.   Assessment/Plan: 1. Functional deficits secondary to Right Brainstem pontine and medullary infarcts which require 3+ hours per day of interdisciplinary therapy in a comprehensive inpatient rehab setting. Physiatrist is providing close team supervision and 24 hour management of active medical problems listed below. Physiatrist and rehab team continue to assess barriers to discharge/monitor patient progress toward functional and medical goals. FIM: Function - Bathing Position: Shower Body parts bathed by patient: Left arm, Chest, Abdomen, Front perineal area, Right upper leg, Left upper leg, Buttocks, Right arm, Right lower leg, Left lower leg Body parts bathed by helper: Right arm, Right lower leg, Left lower leg, Back Assist Level: Supervision or verbal cues, Assistive device Assistive Device Comment: long handled spinge and wash mit  Function- Upper Body Dressing/Undressing What is the patient wearing?: Pull over shirt/dress Pull over shirt/dress - Perfomed by patient: Thread/unthread right sleeve, Thread/unthread left sleeve, Pull shirt over trunk, Put head through opening Pull over shirt/dress - Perfomed by helper: Thread/unthread left sleeve, Put head through opening, Pull shirt over trunk Assist Level: Supervision or verbal cues Set up : To obtain clothing/put away Function - Lower Body Dressing/Undressing What is the patient wearing?: Pants, Shoes, Socks, AFO, Underwear Position: Wheelchair/chair at sink Underwear - Performed by patient: Thread/unthread right underwear leg, Thread/unthread left underwear leg, Pull underwear up/down Pants- Performed by patient: Thread/unthread right pants leg, Thread/unthread left pants leg, Pull pants up/down Pants- Performed by helper: Thread/unthread right pants leg, Pull pants up/down Non-skid slipper socks- Performed by patient: Don/doff right sock, Don/doff left sock Non-skid  slipper socks- Performed by helper: Don/doff right sock Socks - Performed by patient: Don/doff right sock Socks - Performed by helper: Don/doff left sock Shoes - Performed by patient: Don/doff right shoe Shoes - Performed by helper: Don/doff left shoe, Fasten right, Fasten left AFO - Performed by helper: Don/doff left AFO Assist for footwear: Partial/moderate assist Assist for lower body dressing: Supervision or verbal cues  Function - Toileting Toileting activity did not occur: (No BM's, used urinal) Toileting steps completed by patient: Adjust clothing prior to toileting, Performs perineal hygiene Toileting steps completed by helper: Adjust clothing after toileting Toileting Assistive Devices: Grab bar or  rail Assist level: Touching or steadying assistance (Pt.75%)  Function - Air cabin crew transfer activity did not occur: Safety/medical concerns Toilet transfer assistive device: Grab bar, Elevated toilet seat/BSC over toilet Mechanical lift: Stedy Assist level to toilet: Touching or steadying assistance (Pt > 75%) Assist level from toilet: Touching or steadying assistance (Pt > 75%)  Function - Chair/bed transfer Chair/bed transfer activity did not occur: N/A Chair/bed transfer method: Stand pivot Chair/bed transfer assist level: Supervision or verbal cues Chair/bed transfer assistive device: Armrests, Orthosis Chair/bed transfer details: Verbal cues for sequencing  Function - Locomotion: Wheelchair Type: Manual Max wheelchair distance: 143f  Assist Level: No help, No cues, assistive device, takes more than reasonable amount of time Assist Level: No help, No cues, assistive device, takes more than reasonable amount of time Wheel 150 feet activity did not occur: Safety/medical concerns Assist Level: No help, No cues, assistive device, takes more than reasonable amount of time Turns around,maneuvers to table,bed, and toilet,negotiates 3% grade,maneuvers on rugs and  over doorsills: No Function - Locomotion: Ambulation Assistive device: Walker-rolling, Orthosis Max distance: 40 Assist level: Moderate assist (Pt 50 - 74%) Assist level: Touching or steadying assistance (Pt > 75%) Walk 50 feet with 2 turns activity did not occur: Safety/medical concerns Walk 150 feet activity did not occur: Safety/medical concerns Walk 10 feet on uneven surfaces activity did not occur: Safety/medical concerns  Function - Comprehension Comprehension: Auditory Comprehension assist level: Understands basic 90% of the time/cues < 10% of the time  Function - Expression Expression: Verbal Expression assist level: Expresses complex 90% of the time/cues < 10% of the time  Function - Social Interaction Social Interaction assist level: Interacts appropriately 90% of the time - Needs monitoring or encouragement for participation or interaction.  Function - Problem Solving Problem solving assist level: Solves complex 90% of the time/cues < 10% of the time  Function - Memory Memory assist level: Recognizes or recalls 90% of the time/requires cueing < 10% of the time Patient normally able to recall (first 3 days only): Current season, Location of own room, Staff names and faces, That he or she is in a hospital  Medical Problem List and Plan: 1.Left side weakness/dysphagiasecondary to right paramedian brainstem infarct/pons/Medullaas well as history of calcified chronic brain mass: onset 11/30, ASA 3262mper day   Cont CIR   WHO/PRAFO, ROM  Fluoxetine 10 started on 12/17 2. DVT Prophylaxis/Anticoagulation: Subcutaneous Lovenox. Monitor for any bleeding episodes 3. Pain Management:Tylenol as needed 4. Mood:Provide emotional support 5. Neuropsych: This patientiscapable of making decisions on hisown behalf. 6. Skin/Wound Care:Routine skin checks 7. Fluids/Electrolytes/Nutrition:Routine I&O's   BMP within acceptable range on 12/20 8.Hypertension. Norvasc 10 mg  daily, Coreg 25 mg twice a day, Avapro 300 mg daily. Monitor with increased mobility  Vitals:   01/02/17 1437 01/03/17 0607  BP: 140/84 (!) 144/84  Pulse: 84 83  Resp: 16 18  Temp: 98.4 F (36.9 C) 98.5 F (36.9 C)  SpO2: 100% 97%    Relatively controlled on 12/22 9.Hyperlipidemia. Lipitor 10.Diabetes mellitus peripheral neuropathy. Hemoglobin A1c 6.2. SSI. Check blood sugars before meals and at bedtime. Patient on Janumet-XR100-1000 milligrams daily. Resume as needed CBG (last 3)  Recent Labs    01/02/17 1637 01/02/17 2046 01/03/17 0638  GLUCAP 114* 143* 91    Relatively controlled on 12/22 11.  Hx rectal cancer with chemo and radiation in remission 12.  Spastic bladder: improving with oxybutnin suspect CVA related spastic bladder  Slow improvement 13. ABLA  Hb  11.5 on 12/20   Cont to monitor   LOS (Days) 16 A FACE TO FACE EVALUATION WAS PERFORMED  Ankit Lorie Phenix 01/03/2017, 7:56 AM

## 2017-01-04 DIAGNOSIS — R7309 Other abnormal glucose: Secondary | ICD-10-CM

## 2017-01-04 DIAGNOSIS — R0989 Other specified symptoms and signs involving the circulatory and respiratory systems: Secondary | ICD-10-CM

## 2017-01-04 LAB — GLUCOSE, CAPILLARY
GLUCOSE-CAPILLARY: 95 mg/dL (ref 65–99)
GLUCOSE-CAPILLARY: 135 mg/dL — AB (ref 65–99)
GLUCOSE-CAPILLARY: 95 mg/dL (ref 65–99)
Glucose-Capillary: 92 mg/dL (ref 65–99)

## 2017-01-04 NOTE — Progress Notes (Signed)
Subjective/Complaints: Pt seen laying in bed this AM.  He slept well overnight.  He denies complaints.   ROS: Denies nausea, vomiting, diarrhea, shortness of breath or chest pain   Objective: Vital Signs: Blood pressure (!) 158/83, pulse 88, temperature 99.8 F (37.7 C), temperature source Oral, resp. rate 18, height 5\' 3"  (1.6 m), weight 86 kg (189 lb 9.5 oz), SpO2 99 %. No results found. Results for orders placed or performed during the hospital encounter of 12/18/16 (from the past 72 hour(s))  Glucose, capillary     Status: None   Collection Time: 01/01/17 12:04 PM  Result Value Ref Range   Glucose-Capillary 78 65 - 99 mg/dL   Comment 1 Notify RN   Glucose, capillary     Status: None   Collection Time: 01/01/17  5:27 PM  Result Value Ref Range   Glucose-Capillary 90 65 - 99 mg/dL  Glucose, capillary     Status: Abnormal   Collection Time: 01/02/17  6:45 AM  Result Value Ref Range   Glucose-Capillary 103 (H) 65 - 99 mg/dL  Glucose, capillary     Status: None   Collection Time: 01/02/17 11:58 AM  Result Value Ref Range   Glucose-Capillary 90 65 - 99 mg/dL  Glucose, capillary     Status: Abnormal   Collection Time: 01/02/17  4:37 PM  Result Value Ref Range   Glucose-Capillary 114 (H) 65 - 99 mg/dL  Glucose, capillary     Status: Abnormal   Collection Time: 01/02/17  8:46 PM  Result Value Ref Range   Glucose-Capillary 143 (H) 65 - 99 mg/dL  Glucose, capillary     Status: None   Collection Time: 01/03/17  6:38 AM  Result Value Ref Range   Glucose-Capillary 91 65 - 99 mg/dL  Glucose, capillary     Status: Abnormal   Collection Time: 01/03/17 11:46 AM  Result Value Ref Range   Glucose-Capillary 103 (H) 65 - 99 mg/dL  Glucose, capillary     Status: Abnormal   Collection Time: 01/03/17  4:30 PM  Result Value Ref Range   Glucose-Capillary 102 (H) 65 - 99 mg/dL  Glucose, capillary     Status: Abnormal   Collection Time: 01/03/17  9:09 PM  Result Value Ref Range    Glucose-Capillary 200 (H) 65 - 99 mg/dL  Glucose, capillary     Status: None   Collection Time: 01/04/17  6:50 AM  Result Value Ref Range   Glucose-Capillary 92 65 - 99 mg/dL    Gen no apparent distress. Vital signs reviewed.  HEENT: Normocephalic, atraumatic. Cardio: RRR. No JVD   Resp: CTA Bilaterally. Normal effort  GI: BS positive and ND Musc/Skel:  No edema, no tenderness Neuro:  Motor LUE: Shoulder abduction 3/5, 2-/5 elbow flex/ext, 1/5 wrist/hand  LLE: HF, KE 4-/5, ADF 1+/5  Sensation intact to light touch Skin:   Intact. Warm and dry.   Assessment/Plan: 1. Functional deficits secondary to Right Brainstem pontine and medullary infarcts which require 3+ hours per day of interdisciplinary therapy in a comprehensive inpatient rehab setting. Physiatrist is providing close team supervision and 24 hour management of active medical problems listed below. Physiatrist and rehab team continue to assess barriers to discharge/monitor patient progress toward functional and medical goals. FIM: Function - Bathing Position: Shower Body parts bathed by patient: Left arm, Chest, Abdomen, Front perineal area, Right upper leg, Left upper leg, Buttocks, Right arm, Right lower leg, Left lower leg, Back Body parts bathed by helper: Right arm  Assist Level: Supervision or verbal cues, Assistive device(LHS and wash mit to L UE ) Assistive Device Comment: hand over hand for L UE facilitation  Function- Upper Body Dressing/Undressing What is the patient wearing?: Pull over shirt/dress Pull over shirt/dress - Perfomed by patient: Thread/unthread right sleeve, Thread/unthread left sleeve, Pull shirt over trunk, Put head through opening Pull over shirt/dress - Perfomed by helper: Thread/unthread left sleeve, Put head through opening, Pull shirt over trunk Assist Level: Supervision or verbal cues Set up : To obtain clothing/put away Function - Lower Body Dressing/Undressing What is the patient wearing?:  Pants, Shoes, Socks, AFO, Underwear Position: Wheelchair/chair at sink Underwear - Performed by patient: Thread/unthread right underwear leg, Thread/unthread left underwear leg, Pull underwear up/down Pants- Performed by patient: Thread/unthread right pants leg, Thread/unthread left pants leg, Pull pants up/down Pants- Performed by helper: Thread/unthread right pants leg, Pull pants up/down Non-skid slipper socks- Performed by patient: Don/doff right sock, Don/doff left sock Non-skid slipper socks- Performed by helper: Don/doff right sock Socks - Performed by patient: Don/doff right sock Socks - Performed by helper: Don/doff right sock, Don/doff left sock Shoes - Performed by patient: Don/doff right shoe Shoes - Performed by helper: Don/doff right shoe, Don/doff left shoe AFO - Performed by helper: Don/doff left AFO Assist for footwear: Partial/moderate assist Assist for lower body dressing: Supervision or verbal cues  Function - Toileting Toileting activity did not occur: (No BM's, used urinal) Toileting steps completed by patient: Adjust clothing prior to toileting, Performs perineal hygiene Toileting steps completed by helper: Adjust clothing after toileting Toileting Assistive Devices: Grab bar or rail Assist level: Touching or steadying assistance (Pt.75%)  Function - Air cabin crew transfer activity did not occur: Safety/medical concerns Toilet transfer assistive device: Grab bar, Elevated toilet seat/BSC over toilet Mechanical lift: Stedy Assist level to toilet: Touching or steadying assistance (Pt > 75%) Assist level from toilet: Touching or steadying assistance (Pt > 75%)  Function - Chair/bed transfer Chair/bed transfer activity did not occur: N/A Chair/bed transfer method: Stand pivot Chair/bed transfer assist level: Touching or steadying assistance (Pt > 75%) Chair/bed transfer assistive device: Armrests Chair/bed transfer details: Verbal cues for  sequencing  Function - Locomotion: Wheelchair Type: Manual Max wheelchair distance: 164ft  Assist Level: No help, No cues, assistive device, takes more than reasonable amount of time Assist Level: No help, No cues, assistive device, takes more than reasonable amount of time Wheel 150 feet activity did not occur: Safety/medical concerns Assist Level: No help, No cues, assistive device, takes more than reasonable amount of time Turns around,maneuvers to table,bed, and toilet,negotiates 3% grade,maneuvers on rugs and over doorsills: No Function - Locomotion: Ambulation Assistive device: Walker-rolling, Orthosis Max distance: 40 Assist level: Moderate assist (Pt 50 - 74%) Assist level: Touching or steadying assistance (Pt > 75%) Walk 50 feet with 2 turns activity did not occur: Safety/medical concerns Walk 150 feet activity did not occur: Safety/medical concerns Walk 10 feet on uneven surfaces activity did not occur: Safety/medical concerns  Function - Comprehension Comprehension: Auditory Comprehension assist level: Understands basic 90% of the time/cues < 10% of the time  Function - Expression Expression: Verbal Expression assist level: Expresses complex 90% of the time/cues < 10% of the time  Function - Social Interaction Social Interaction assist level: Interacts appropriately 90% of the time - Needs monitoring or encouragement for participation or interaction.  Function - Problem Solving Problem solving assist level: Solves complex 90% of the time/cues < 10% of the time  Function -  Memory Memory assist level: Recognizes or recalls 90% of the time/requires cueing < 10% of the time Patient normally able to recall (first 3 days only): Current season, Location of own room, Staff names and faces, That he or she is in a hospital  Medical Problem List and Plan: 1.Left side weakness/dysphagiasecondary to right paramedian brainstem infarct/pons/Medullaas well as history of calcified  chronic brain mass: onset 11/30, ASA 325mg  per day   Cont CIR   WHO/PRAFO, ROM  Fluoxetine 10 started on 12/17 2. DVT Prophylaxis/Anticoagulation: Subcutaneous Lovenox. Monitor for any bleeding episodes 3. Pain Management:Tylenol as needed 4. Mood:Provide emotional support 5. Neuropsych: This patientiscapable of making decisions on hisown behalf. 6. Skin/Wound Care:Routine skin checks 7. Fluids/Electrolytes/Nutrition:Routine I&O's   BMP within acceptable range on 12/20 8.Hypertension. Norvasc 10 mg daily, Coreg 25 mg twice a day, Avapro 300 mg daily. Monitor with increased mobility  Vitals:   01/03/17 1437 01/04/17 0515  BP: 120/75 (!) 158/83  Pulse: 78 88  Resp: 18 18  Temp: 98.1 F (36.7 C) 99.8 F (37.7 C)  SpO2: 99% 99%    Elevated this a.m., otherwise relatively controlled  9.Hyperlipidemia. Lipitor 10.Diabetes mellitus peripheral neuropathy. Hemoglobin A1c 6.2. SSI. Check blood sugars before meals and at bedtime. Patient on Janumet-XR100-1000 milligrams daily. Resume as needed CBG (last 3)  Recent Labs    01/03/17 1630 01/03/17 2109 01/04/17 0650  GLUCAP 102* 200* 92    Overall controlled, with one spike overnight, continue to monitor 11.  Hx rectal cancer with chemo and radiation in remission 12.  Spastic bladder: improving with oxybutnin suspect CVA related spastic bladder  Slow improvement 13. ABLA  Hb 11.5 on 12/20   Cont to monitor   LOS (Days) 17 A FACE TO FACE EVALUATION WAS PERFORMED  Mariesha Venturella Lorie Phenix 01/04/2017, 7:54 AM

## 2017-01-05 ENCOUNTER — Inpatient Hospital Stay (HOSPITAL_COMMUNITY): Payer: Self-pay | Admitting: Occupational Therapy

## 2017-01-05 ENCOUNTER — Other Ambulatory Visit: Payer: Self-pay

## 2017-01-05 ENCOUNTER — Inpatient Hospital Stay (HOSPITAL_COMMUNITY): Payer: Self-pay

## 2017-01-05 ENCOUNTER — Inpatient Hospital Stay (HOSPITAL_COMMUNITY): Payer: Self-pay | Admitting: Physical Therapy

## 2017-01-05 LAB — GLUCOSE, CAPILLARY
GLUCOSE-CAPILLARY: 106 mg/dL — AB (ref 65–99)
GLUCOSE-CAPILLARY: 111 mg/dL — AB (ref 65–99)
Glucose-Capillary: 143 mg/dL — ABNORMAL HIGH (ref 65–99)
Glucose-Capillary: 126 mg/dL — ABNORMAL HIGH (ref 65–99)

## 2017-01-05 NOTE — Progress Notes (Signed)
Subjective/Complaints:  Happy with Left AFO Bladder freq normalized  ROS: Denies nausea, vomiting, diarrhea, shortness of breath or chest pain   Objective: Vital Signs: Blood pressure (!) 157/84, pulse 87, temperature 98.1 F (36.7 C), temperature source Oral, resp. rate 18, height 5\' 3"  (1.6 m), weight 86 kg (189 lb 9.5 oz), SpO2 96 %. No results found. Results for orders placed or performed during the hospital encounter of 12/18/16 (from the past 72 hour(s))  Glucose, capillary     Status: None   Collection Time: 01/02/17 11:58 AM  Result Value Ref Range   Glucose-Capillary 90 65 - 99 mg/dL  Glucose, capillary     Status: Abnormal   Collection Time: 01/02/17  4:37 PM  Result Value Ref Range   Glucose-Capillary 114 (H) 65 - 99 mg/dL  Glucose, capillary     Status: Abnormal   Collection Time: 01/02/17  8:46 PM  Result Value Ref Range   Glucose-Capillary 143 (H) 65 - 99 mg/dL  Glucose, capillary     Status: None   Collection Time: 01/03/17  6:38 AM  Result Value Ref Range   Glucose-Capillary 91 65 - 99 mg/dL  Glucose, capillary     Status: Abnormal   Collection Time: 01/03/17 11:46 AM  Result Value Ref Range   Glucose-Capillary 103 (H) 65 - 99 mg/dL  Glucose, capillary     Status: Abnormal   Collection Time: 01/03/17  4:30 PM  Result Value Ref Range   Glucose-Capillary 102 (H) 65 - 99 mg/dL  Glucose, capillary     Status: Abnormal   Collection Time: 01/03/17  9:09 PM  Result Value Ref Range   Glucose-Capillary 200 (H) 65 - 99 mg/dL  Glucose, capillary     Status: None   Collection Time: 01/04/17  6:50 AM  Result Value Ref Range   Glucose-Capillary 92 65 - 99 mg/dL  Glucose, capillary     Status: None   Collection Time: 01/04/17 11:51 AM  Result Value Ref Range   Glucose-Capillary 95 65 - 99 mg/dL  Glucose, capillary     Status: Abnormal   Collection Time: 01/04/17  4:33 PM  Result Value Ref Range   Glucose-Capillary 135 (H) 65 - 99 mg/dL  Glucose, capillary      Status: None   Collection Time: 01/04/17  9:18 PM  Result Value Ref Range   Glucose-Capillary 95 65 - 99 mg/dL  Glucose, capillary     Status: Abnormal   Collection Time: 01/05/17  7:04 AM  Result Value Ref Range   Glucose-Capillary 106 (H) 65 - 99 mg/dL    Gen no apparent distress. Vital signs reviewed.  HEENT: Normocephalic, atraumatic. Cardio: RRR. No JVD   Resp: CTA Bilaterally. Normal effort  GI: BS positive and ND Musc/Skel:  No edema, no tenderness Neuro:  Motor LUE: Shoulder abduction 3/5, 2-/5 elbow flex/ext, 1/5 wrist/hand  LLE: HF, KE 4-/5, ADF 1+/5  Sensation intact to light touch Skin:   Intact. Warm and dry.   Assessment/Plan: 1. Functional deficits secondary to Right Brainstem pontine and medullary infarcts which require 3+ hours per day of interdisciplinary therapy in a comprehensive inpatient rehab setting. Physiatrist is providing close team supervision and 24 hour management of active medical problems listed below. Physiatrist and rehab team continue to assess barriers to discharge/monitor patient progress toward functional and medical goals. FIM: Function - Bathing Position: Shower Body parts bathed by patient: Left arm, Chest, Abdomen, Front perineal area, Right upper leg, Left upper leg, Buttocks, Right  arm, Right lower leg, Left lower leg, Back Body parts bathed by helper: Right arm Assist Level: Supervision or verbal cues, Assistive device(LHS and wash mit to L UE ) Assistive Device Comment: hand over hand for L UE facilitation  Function- Upper Body Dressing/Undressing What is the patient wearing?: Pull over shirt/dress Pull over shirt/dress - Perfomed by patient: Thread/unthread right sleeve, Thread/unthread left sleeve, Pull shirt over trunk, Put head through opening Pull over shirt/dress - Perfomed by helper: Thread/unthread left sleeve, Put head through opening, Pull shirt over trunk Assist Level: Supervision or verbal cues Set up : To obtain  clothing/put away Function - Lower Body Dressing/Undressing What is the patient wearing?: Pants, Shoes, Socks, AFO, Underwear Position: Wheelchair/chair at sink Underwear - Performed by patient: Thread/unthread right underwear leg, Thread/unthread left underwear leg, Pull underwear up/down Pants- Performed by patient: Thread/unthread right pants leg, Thread/unthread left pants leg, Pull pants up/down Pants- Performed by helper: Thread/unthread right pants leg, Pull pants up/down Non-skid slipper socks- Performed by patient: Don/doff right sock, Don/doff left sock Non-skid slipper socks- Performed by helper: Don/doff right sock Socks - Performed by patient: Don/doff right sock Socks - Performed by helper: Don/doff right sock, Don/doff left sock Shoes - Performed by patient: Don/doff right shoe Shoes - Performed by helper: Don/doff right shoe, Don/doff left shoe AFO - Performed by helper: Don/doff left AFO Assist for footwear: Partial/moderate assist Assist for lower body dressing: Supervision or verbal cues  Function - Toileting Toileting activity did not occur: (No BM's, used urinal) Toileting steps completed by patient: Adjust clothing prior to toileting, Performs perineal hygiene Toileting steps completed by helper: Adjust clothing after toileting Toileting Assistive Devices: Grab bar or rail Assist level: Touching or steadying assistance (Pt.75%)  Function - Air cabin crew transfer activity did not occur: Safety/medical concerns Toilet transfer assistive device: Grab bar, Elevated toilet seat/BSC over toilet Mechanical lift: Stedy Assist level to toilet: Touching or steadying assistance (Pt > 75%) Assist level from toilet: Touching or steadying assistance (Pt > 75%)  Function - Chair/bed transfer Chair/bed transfer activity did not occur: N/A Chair/bed transfer method: Stand pivot Chair/bed transfer assist level: Touching or steadying assistance (Pt > 75%) Chair/bed  transfer assistive device: Armrests Chair/bed transfer details: Verbal cues for sequencing  Function - Locomotion: Wheelchair Type: Manual Max wheelchair distance: 124ft  Assist Level: No help, No cues, assistive device, takes more than reasonable amount of time Assist Level: No help, No cues, assistive device, takes more than reasonable amount of time Wheel 150 feet activity did not occur: Safety/medical concerns Assist Level: No help, No cues, assistive device, takes more than reasonable amount of time Turns around,maneuvers to table,bed, and toilet,negotiates 3% grade,maneuvers on rugs and over doorsills: No Function - Locomotion: Ambulation Assistive device: Walker-rolling, Orthosis Max distance: 40 Assist level: Moderate assist (Pt 50 - 74%) Assist level: Touching or steadying assistance (Pt > 75%) Walk 50 feet with 2 turns activity did not occur: Safety/medical concerns Walk 150 feet activity did not occur: Safety/medical concerns Walk 10 feet on uneven surfaces activity did not occur: Safety/medical concerns  Function - Comprehension Comprehension: Auditory Comprehension assist level: Understands basic 90% of the time/cues < 10% of the time  Function - Expression Expression: Verbal Expression assist level: Expresses complex 90% of the time/cues < 10% of the time  Function - Social Interaction Social Interaction assist level: Interacts appropriately 90% of the time - Needs monitoring or encouragement for participation or interaction.  Function - Problem Solving Problem solving assist  level: Solves complex 90% of the time/cues < 10% of the time  Function - Memory Memory assist level: Recognizes or recalls 90% of the time/requires cueing < 10% of the time Patient normally able to recall (first 3 days only): Current season, Location of own room, Staff names and faces, That he or she is in a hospital  Medical Problem List and Plan: 1.Left side weakness/dysphagiasecondary  to right paramedian brainstem infarct/pons/Medullaas well as history of calcified chronic brain mass: onset 11/30, ASA 325mg  per day   Cont CIR   WHO/PRAFO, ROM   2. DVT Prophylaxis/Anticoagulation: Subcutaneous Lovenox. Monitor for any bleeding episodes 3. Pain Management:Tylenol as needed 4. Mood:Provide emotional support, brighter on fluoxetine 10mg  5. Neuropsych: This patientiscapable of making decisions on hisown behalf. 6. Skin/Wound Care:Routine skin checks 7. Fluids/Electrolytes/Nutrition:Routine I&O's   BMP within acceptable range on 12/20 8.Hypertension. Norvasc 10 mg daily, Coreg 25 mg twice a day, Avapro 300 mg daily. Monitor with increased mobility  Vitals:   01/04/17 1730 01/05/17 0551  BP: 134/88 (!) 157/84  Pulse: 85 87  Resp: 19 18  Temp: 99.2 F (37.3 C) 98.1 F (36.7 C)  SpO2: 100% 96%     12/24 controlled  9.Hyperlipidemia. Lipitor 10.Diabetes mellitus peripheral neuropathy. Hemoglobin A1c 6.2. SSI. Check blood sugars before meals and at bedtime. Patient on Janumet-XR100-1000 milligrams daily. Resume as needed CBG (last 3)  Recent Labs    01/04/17 1633 01/04/17 2118 01/05/17 0704  GLUCAP 135* 95 106*    12/24 controlled,  11.  Hx rectal cancer with chemo and radiation in remission 12.  Spastic bladder: improving with oxybutnin suspect CVA related spastic bladder  Improved, no nocturia 13. ABLA  Hb 11.5 on 12/20   Cont to monitor   LOS (Days) 18 A FACE TO FACE EVALUATION WAS PERFORMED  Charlett Blake 01/05/2017, 8:49 AM

## 2017-01-05 NOTE — Progress Notes (Signed)
Occupational Therapy Session Note  Patient Details  Name: [name] MRN: [id number] Date of Birth: January 14, 1965  Today's Date: 01/05/2017 OT Individual Time: 1155-2080 OT Individual Time Calculation (min): 60 min   Skilled Therapeutic Interventions/Progress Updates: Patient in bed resting with breakfast tray not started upon approach for skilled OT.    He participated today as follows:  Self feeding Supervision with tray setup Supine with head of  Bed elevated to w/c transfer (squat pivot)= Close S  W/c to/fr tub transfer bench in shower via holding onto grab as well=Close S for squat pivot  UB bathing and dressing close S (used L hand wash cloth mit as with gross hand assist)  LB bathing (going sit to stand to wash periarea) = moderate assistance for L LE & patient able to maintain balance with close S for peri buttock washing   LB dressing = Min A for L LE  Patient left seated in w/c to eat his cereal (setup) that he did not have time to eat before the session.   Call bell and cell phone within reach      Therapy Documentation Precautions:  Precautions Precautions: Fall Precaution Comments: L hemi Restrictions Weight Bearing Restrictions: No  Pain:denied     Therapy/Group: Individual Therapy  Alfredia Ferguson [facility] 01/05/2017, 10:35 AM

## 2017-01-05 NOTE — Progress Notes (Signed)
Physical Therapy Session Note  Patient Details  Name: [name] MRN: [id number] Date of Birth: Oct 11, 1965  Today's Date: 01/05/2017 PT Individual Time: 1400-1500 PT Individual Time Calculation (min): 60 min   Short Term Goals: Week 2:  PT Short Term Goal 1 (Week 2): Pt will transfer with min assist to [facility] PT Short Term Goal 2 (Week 2): Pt will perform all bed mobility with supervision assist  PT Short Term Goal 3 (Week 2): Pt will ambulate 45f with mod assist and LRAD  PT Short Term Goal 4 (Week 2): Pt will ascend 1 step with mod assist and LRD   Skilled Therapeutic Interventions/Progress Updates:    no c/o pain at rest but reports 5/10 pain with weightbearing in R foot.  Painful to palpation, with overpressure in plantarflexion, and with weight bearing making transfers difficult and gait unsafe this session.  Focus on pain management and LUE NMR as well as d/c planning.    Pt propels w/c throughout unit with hemi technique mod I.  Attempted sit<>stand but R foot too painful.  Applied ace wrap and pt able to come to standing with supervision and increased time due to pain.  Transfer to therapy mat with stand/pivot and ultimately needing min assist 2/2 pain in foot.  Lateral scoot back to w/c with close supervision.  NMR for LUE   Discussed alternatives for home entry (pt's wife provided pictures). Would be feasible to bump w/c up step into house if wife is able.  Will continue to discuss with OT and SW to determine safest d/c plan if pt's foot continues to be painful.  Returned to room and left upright with call bell in reach and needs met.   Therapy Documentation Precautions:  Precautions Precautions: Fall Precaution Comments: L hemi Restrictions Weight Bearing Restrictions: No  See Function Navigator for Current Functional Status.   Therapy/Group: Individual Therapy  CMichel Santee12/24/2018, 3:06 PM

## 2017-01-05 NOTE — Progress Notes (Signed)
Occupational Therapy Note  Patient Details  Name: [name] MRN: [id number] Date of Birth: 23-Mar-1965  Today's Date: 01/05/2017 OT Individual Time: 0900-1000 OT Individual Time Calculation (min): 60 min   Pt denies pain Individual Therapy  Pt resting in w/c upon arrival and agreeable to therapy.  OT intervention with focus on functional transfers, LUE NMR, and standing balance.  Pt initially practiced tub bench transfers.  Pt amb with RW into bathroom and performed transfer with min A and mod verbal cues for sequencing.  Pt required assistance with placement of LUE/hand on RW.  Pt propelled w/c to gym and engaged in LUE including weightbearing on L elbow.  Pt initiates significant compensatory movements to initiate LUE movement, primarily shoulder elevation and activation of upper traps.  Pt engaged in dynamic standing tasks with focus on BLE placement and weight shifts.  Pt returned to room and remained in w/c with all needs within reach.    Leotis Shames [facility] 01/05/2017, 12:07 PM

## 2017-01-05 NOTE — Progress Notes (Signed)
Occupational Therapy Session Note  Patient Details  Name: [name] MRN: [id number] Date of Birth: [birth year]  Today's Date: 01/05/2017 OT Individual Time: 1030-1100 OT Individual Time Calculation (min): 30 min    Short Term Goals: Week 3:  OT Short Term Goal 1 (Week 3): STG=LTG 2/2 ELOS  Skilled Therapeutic Interventions/Progress Updates:    Pt received sitting up in w/c, no c/o pain and agreeable to OT tx session. Pt requests to practice walking. Completes sit<>stand and ambulation using hemiwalker with overall MinA, ambulating approx 5' x2 sets. Pt self propels w/c to therapy gym using hemi technique with S, completes stand pivot to the L w/c>mat table with MinA, increased time and mod verbal cues for sequencing steps during transfer. Seated on mat table Pt engages in LUE P/AAROM across all planes. Pt returns to w/c with MinA, returns to room in manner described above where Pt was left seated in w/c, call bell and needs within reach.   Therapy Documentation Precautions:  Precautions Precautions: Fall Precaution Comments: L hemi Restrictions Weight Bearing Restrictions: No    ADL: ADL ADL Comments: Please see functional navigato  See Function Navigator for Current Functional Status.   Therapy/Group: Individual Therapy  Raymondo Band 01/05/2017, 1:24 PM

## 2017-01-06 LAB — GLUCOSE, CAPILLARY
GLUCOSE-CAPILLARY: 108 mg/dL — AB (ref 65–99)
GLUCOSE-CAPILLARY: 143 mg/dL — AB (ref 65–99)
GLUCOSE-CAPILLARY: 105 mg/dL — AB (ref 65–99)

## 2017-01-06 NOTE — Progress Notes (Signed)
Subjective/Complaints:  Right foot pain, mainly at heel.  Prior hx gout in R big toe but denies toe pain.  No recent or prior trauma Pain with WB  ROS: Denies nausea, vomiting, diarrhea, shortness of breath or chest pain   Objective: Vital Signs: Blood pressure (!) 145/95, pulse 86, temperature 99.4 F (37.4 C), temperature source Oral, resp. rate 18, height 5\' 3"  (1.6 m), weight 86 kg (189 lb 9.5 oz), SpO2 95 %. No results found. Results for orders placed or performed during the hospital encounter of 12/18/16 (from the past 72 hour(s))  Glucose, capillary     Status: Abnormal   Collection Time: 01/03/17 11:46 AM  Result Value Ref Range   Glucose-Capillary 103 (H) 65 - 99 mg/dL  Glucose, capillary     Status: Abnormal   Collection Time: 01/03/17  4:30 PM  Result Value Ref Range   Glucose-Capillary 102 (H) 65 - 99 mg/dL  Glucose, capillary     Status: Abnormal   Collection Time: 01/03/17  9:09 PM  Result Value Ref Range   Glucose-Capillary 200 (H) 65 - 99 mg/dL  Glucose, capillary     Status: None   Collection Time: 01/04/17  6:50 AM  Result Value Ref Range   Glucose-Capillary 92 65 - 99 mg/dL  Glucose, capillary     Status: None   Collection Time: 01/04/17 11:51 AM  Result Value Ref Range   Glucose-Capillary 95 65 - 99 mg/dL  Glucose, capillary     Status: Abnormal   Collection Time: 01/04/17  4:33 PM  Result Value Ref Range   Glucose-Capillary 135 (H) 65 - 99 mg/dL  Glucose, capillary     Status: None   Collection Time: 01/04/17  9:18 PM  Result Value Ref Range   Glucose-Capillary 95 65 - 99 mg/dL  Glucose, capillary     Status: Abnormal   Collection Time: 01/05/17  7:04 AM  Result Value Ref Range   Glucose-Capillary 106 (H) 65 - 99 mg/dL  Glucose, capillary     Status: Abnormal   Collection Time: 01/05/17 11:37 AM  Result Value Ref Range   Glucose-Capillary 143 (H) 65 - 99 mg/dL  Glucose, capillary     Status: Abnormal   Collection Time: 01/05/17  4:30 PM   Result Value Ref Range   Glucose-Capillary 111 (H) 65 - 99 mg/dL  Glucose, capillary     Status: Abnormal   Collection Time: 01/05/17  9:45 PM  Result Value Ref Range   Glucose-Capillary 126 (H) 65 - 99 mg/dL   Comment 1 Notify RN   Glucose, capillary     Status: Abnormal   Collection Time: 01/06/17  5:22 AM  Result Value Ref Range   Glucose-Capillary 108 (H) 65 - 99 mg/dL    Gen no apparent distress. Vital signs reviewed.  HEENT: Normocephalic, atraumatic. Cardio: RRR. No JVD   Resp: CTA Bilaterally. Normal effort  GI: BS positive and ND Musc/Skel:  No edema, no erythema, no pain with toe or foot ROM on Rplantar heel tenderness Neuro:  Motor LUE: Shoulder abduction 3/5, 2-/5 elbow flex/ext, 1/5 wrist/hand  LLE: HF, KE 4-/5, ADF 1+/5  Sensation intact to light touch Skin:   Intact. Warm and dry.   Assessment/Plan: 1. Functional deficits secondary to Right Brainstem pontine and medullary infarcts which require 3+ hours per day of interdisciplinary therapy in a comprehensive inpatient rehab setting. Physiatrist is providing close team supervision and 24 hour management of active medical problems listed below. Physiatrist and rehab  team continue to assess barriers to discharge/monitor patient progress toward functional and medical goals. FIM: Function - Bathing Position: Shower Body parts bathed by patient: Left arm, Chest, Abdomen, Front perineal area, Right upper leg, Left upper leg, Buttocks, Right arm, Right lower leg, Left lower leg, Back Body parts bathed by helper: Right arm Assist Level: Supervision or verbal cues, Assistive device(LHS and wash mit to L UE ) Assistive Device Comment: hand over hand for L UE facilitation  Function- Upper Body Dressing/Undressing What is the patient wearing?: Pull over shirt/dress Pull over shirt/dress - Perfomed by patient: Thread/unthread right sleeve, Thread/unthread left sleeve, Pull shirt over trunk, Put head through opening Pull over  shirt/dress - Perfomed by helper: Thread/unthread left sleeve, Put head through opening, Pull shirt over trunk Assist Level: Supervision or verbal cues Set up : To obtain clothing/put away Function - Lower Body Dressing/Undressing What is the patient wearing?: Pants, Shoes, Socks, AFO, Underwear Position: Wheelchair/chair at sink Underwear - Performed by patient: Thread/unthread right underwear leg, Thread/unthread left underwear leg, Pull underwear up/down Pants- Performed by patient: Thread/unthread right pants leg, Thread/unthread left pants leg, Pull pants up/down Pants- Performed by helper: Thread/unthread right pants leg, Pull pants up/down Non-skid slipper socks- Performed by patient: Don/doff right sock, Don/doff left sock Non-skid slipper socks- Performed by helper: Don/doff right sock Socks - Performed by patient: Don/doff right sock Socks - Performed by helper: Don/doff right sock, Don/doff left sock Shoes - Performed by patient: Don/doff right shoe Shoes - Performed by helper: Don/doff right shoe, Don/doff left shoe AFO - Performed by helper: Don/doff left AFO Assist for footwear: Partial/moderate assist Assist for lower body dressing: Supervision or verbal cues  Function - Toileting Toileting activity did not occur: (No BM's, used urinal) Toileting steps completed by patient: Adjust clothing prior to toileting, Performs perineal hygiene Toileting steps completed by helper: Adjust clothing after toileting Toileting Assistive Devices: Grab bar or rail Assist level: Touching or steadying assistance (Pt.75%)  Function - Air cabin crew transfer activity did not occur: Safety/medical concerns Toilet transfer assistive device: Grab bar, Elevated toilet seat/BSC over toilet Mechanical lift: Stedy Assist level to toilet: Touching or steadying assistance (Pt > 75%) Assist level from toilet: Touching or steadying assistance (Pt > 75%)  Function - Chair/bed  transfer Chair/bed transfer activity did not occur: N/A Chair/bed transfer method: Stand pivot Chair/bed transfer assist level: Touching or steadying assistance (Pt > 75%) Chair/bed transfer assistive device: Armrests Chair/bed transfer details: Verbal cues for sequencing  Function - Locomotion: Wheelchair Type: Manual Max wheelchair distance: 176ft  Assist Level: No help, No cues, assistive device, takes more than reasonable amount of time Assist Level: No help, No cues, assistive device, takes more than reasonable amount of time Wheel 150 feet activity did not occur: Safety/medical concerns Assist Level: No help, No cues, assistive device, takes more than reasonable amount of time Turns around,maneuvers to table,bed, and toilet,negotiates 3% grade,maneuvers on rugs and over doorsills: No Function - Locomotion: Ambulation Assistive device: Walker-rolling, Orthosis Max distance: 40 Assist level: Moderate assist (Pt 50 - 74%) Assist level: Touching or steadying assistance (Pt > 75%) Walk 50 feet with 2 turns activity did not occur: Safety/medical concerns Walk 150 feet activity did not occur: Safety/medical concerns Walk 10 feet on uneven surfaces activity did not occur: Safety/medical concerns  Function - Comprehension Comprehension: Auditory Comprehension assist level: Understands basic 90% of the time/cues < 10% of the time  Function - Expression Expression: Verbal Expression assist level: Expresses complex 90%  of the time/cues < 10% of the time  Function - Social Interaction Social Interaction assist level: Interacts appropriately 90% of the time - Needs monitoring or encouragement for participation or interaction.  Function - Problem Solving Problem solving assist level: Solves complex 90% of the time/cues < 10% of the time  Function - Memory Memory assist level: Recognizes or recalls 90% of the time/requires cueing < 10% of the time Patient normally able to recall (first  3 days only): Current season, Location of own room, Staff names and faces, That he or she is in a hospital  Medical Problem List and Plan: 1.Left side weakness/dysphagiasecondary to right paramedian brainstem infarct/pons/Medullaas well as history of calcified chronic brain mass: onset 11/30, ASA 325mg  per day   Cont CIR, team conf in am   WHO/PRAFO, ROM   2. DVT Prophylaxis/Anticoagulation: Subcutaneous Lovenox. Monitor for any bleeding episodes 3. Pain Management:Tylenol as needed 4. Mood:Provide emotional support, brighter on fluoxetine 10mg  5. Neuropsych: This patientiscapable of making decisions on hisown behalf. 6. Skin/Wound Care:Routine skin checks 7. Fluids/Electrolytes/Nutrition:Routine I&O's   BMP within acceptable range on 12/20 8.Hypertension. Norvasc 10 mg daily, Coreg 25 mg twice a day, Avapro 300 mg daily. Monitor with increased mobility  Vitals:   01/05/17 1342 01/06/17 0525  BP: 124/75 (!) 145/95  Pulse: 85 86  Resp: 18 18  Temp: 98.7 F (37.1 C) 99.4 F (37.4 C)  SpO2: 100% 95%     12/25 cont to fluctuate-no med changes for now 9.Hyperlipidemia. Lipitor 10.Diabetes mellitus peripheral neuropathy. Hemoglobin A1c 6.2. SSI. Check blood sugars before meals and at bedtime. Patient on Janumet-XR100-1000 milligrams daily. Resume as needed CBG (last 3)  Recent Labs    01/05/17 1630 01/05/17 2145 01/06/17 0522  GLUCAP 111* 126* 108*    12/25 controlled,  11.  Hx rectal cancer with chemo and radiation in remission 12.  Spastic bladder: improving with oxybutnin suspect CVA related spastic bladder  Improved, no nocturia 13. ABLA  Hb 11.5 on 12/20   Cont to monitor 14.  Right plantar foot pain- plantar fasciitis, cont PT, achilles stretch, ice, no NSAIDs due to CVA  LOS (Days) 19 A FACE TO FACE EVALUATION WAS PERFORMED  Charlett Blake 01/06/2017, 8:11 AM

## 2017-01-07 ENCOUNTER — Inpatient Hospital Stay (HOSPITAL_COMMUNITY): Payer: Self-pay | Admitting: Physical Therapy

## 2017-01-07 ENCOUNTER — Inpatient Hospital Stay (HOSPITAL_COMMUNITY): Payer: Self-pay | Admitting: Occupational Therapy

## 2017-01-07 LAB — GLUCOSE, CAPILLARY
GLUCOSE-CAPILLARY: 106 mg/dL — AB (ref 65–99)
GLUCOSE-CAPILLARY: 90 mg/dL (ref 65–99)
Glucose-Capillary: 112 mg/dL — ABNORMAL HIGH (ref 65–99)

## 2017-01-07 NOTE — Plan of Care (Signed)
Goals not met d/t functional deficits contributed to CVA, 12/26

## 2017-01-07 NOTE — Progress Notes (Signed)
Occupational Therapy Discharge Summary  Patient Details  Name: [name] MRN: [id number] Date of Birth: [birth year]  Today's Date: 01/07/2017 OT Individual Time: 1300-1400 OT Individual Time Calculation (min): 60 min    Patient has met 8 of 11 long term goals due to functional use of  LEFT lower extremity.  Patient to discharge at overall Supervision to min A level.  Patient's care partner is independent to provide the necessary physical assistance at discharge.    Reasons goals not met: Pt has impaired neuromuscular abilities secondary to CVA to L side that affects his ability to complete UB donning of long sleeve and sweat shirts and LB donning of socks and footwear/AFO ind'ly. Pt requires assistance for these tasks. Therapist recommended wearing short sleeve shirts and using ankle socks with small step stool to help with dressing upon return home. Pt continues to require S for toilet transfers d/t decreased safety awareness and perception.   Recommendation:  Patient will benefit from ongoing skilled OT services in home health setting to continue to advance functional skills in the area of BADL.  Equipment: Electronics engineer, wheelchair, walker   Reasons for discharge: treatment goals met  And discharge from hospital.    Patient/family agrees with progress made and goals achieved: Yes  OT Discharge Precautions/Restrictions  Precautions Precautions: Fall Precaution Comments: ongoing L hemiparesis UE>LE Restrictions Weight Bearing Restrictions: No Vital Signs Therapy Vitals Temp: 98.4 F (36.9 C) Temp Source: Oral Pulse Rate: 90 Resp: 18 BP: 117/73 Patient Position (if appropriate): Sitting Oxygen Therapy SpO2: 100 % O2 Device: Not Delivered Pain Pain Assessment Pain Assessment: No/denies pain ADL ADL ADL Comments: Please see functional navigato Vision Baseline Vision/History: Wears glasses Wears Glasses: At all times Patient Visual Report: No change from  baseline Vision Assessment?: No apparent visual deficits Perception  Perception: Within Functional Limits Praxis Praxis: Intact Cognition Overall Cognitive Status: Within Functional Limits for tasks assessed Arousal/Alertness: Awake/alert Orientation Level: Oriented X4(cues for date) Selective Attention: Appears intact Memory: Appears intact Awareness: Appears intact Problem Solving: Appears intact Safety/Judgment: Appears intact Sensation Sensation Light Touch: Impaired Detail Light Touch Impaired Details: Impaired LLE(decreased sensation to LT on L5 dermatome) Proprioception: Appears Intact Coordination Gross Motor Movements are Fluid and Coordinated: No Fine Motor Movements are Fluid and Coordinated: No Coordination and Movement Description: ongoing LUE hemiparesis Motor  Motor Motor: Hemiplegia;Abnormal postural alignment and control Motor - Discharge Observations: L hemiparesis UE>LE Mobility  Bed Mobility Bed Mobility: Supine to Sit;Sit to Supine Supine to Sit: 6: Modified independent (Device/Increase time) Sit to Supine: 6: Modified independent (Device/Increase time) Transfers Sit to Stand: 6: Modified independent (Device/Increase time) Stand to Sit: 6: Modified independent (Device/Increase time)  Trunk/Postural Assessment  Cervical Assessment Cervical Assessment: Within Functional Limits Thoracic Assessment Thoracic Assessment: Exceptions to WFL(rounded shoulders) Lumbar Assessment Lumbar Assessment: Exceptions to WFL(posterior pelvic tilt) Postural Control Righting Reactions: intact Protective Responses: delayed  Balance Balance Balance Assessed: Yes Static Sitting Balance Static Sitting - Balance Support: Feet supported;No upper extremity supported Static Sitting - Level of Assistance: 6: Modified independent (Device/Increase time) Dynamic Sitting Balance Dynamic Sitting - Balance Support: During functional activity Dynamic Sitting - Level of  Assistance: 5: Stand by assistance Static Standing Balance Static Standing - Balance Support: During functional activity Static Standing - Level of Assistance: 5: Stand by assistance Dynamic Standing Balance Dynamic Standing - Balance Support: During functional activity Dynamic Standing - Level of Assistance: 4: Min assist       See Function Navigator for Current Functional Status.  Delon Sacramento 01/07/2017, 4:04 PM

## 2017-01-07 NOTE — Progress Notes (Signed)
Physical Therapy Session Note  Patient Details  Name: [name] MRN: [id number] Date of Birth: 05-28-1965  Today's Date: 01/07/2017 PT Individual Time: 0930-1030 and [phone] PT Individual Time Calculation (min): 60 min and 30 min  Short Term Goals: Week 2:  PT Short Term Goal 1 (Week 2): Pt will transfer with min assist to [facility] PT Short Term Goal 2 (Week 2): Pt will perform all bed mobility with supervision assist  PT Short Term Goal 3 (Week 2): Pt will ambulate 49f with mod assist and LRAD  PT Short Term Goal 4 (Week 2): Pt will ascend 1 step with mod assist and LRD   Skilled Therapeutic Interventions/Progress Updates: Tx1: Pt presented in w/c agreeable to therapy. Propelled to rehab gym supervision. Pt educated on performing curb transfers. Pt performed curb transfers x 6 initially requiring modA fading to minA close to min guard. Pt's wife present halfway through session and provided verbal instruction however did not perform hands on. Pt participated in functional activities including car transfer, and bed mobility. Bed transfer initially performed on bed in ADL apt, wife provided height to home bed (30in). Practiced on mat at max height of 29in. PTA instructed in use of stool to increase leverage. Wife present to observe bed transfer and answer questions. Pt able to transfer to bed with use of stool and min guard and increased time. Pt returned to room and left with wife present and needs met.   Tx2: Pt presented in bathroom and NT present assisting transfer returning to w/c. Session focused on reinforcement of bed transfer from 29in. Pt performed with less cues for sequencing and min guard with use of step/stool. Gait training with RW 313fwith minA and cues for decreasing LLE ER and staying close to RW. Pt returned to room at end fo session with needs met.       Therapy Documentation Precautions:  Precautions Precautions: Fall Precaution Comments: L hemi Restrictions Weight  Bearing Restrictions: No General:   Vital Signs: Therapy Vitals Pulse Rate: 90 BP: 134/86   See Function Navigator for Current Functional Status.   Therapy/Group: Individual Therapy  Johnluke Haugen  Artemis Loyal, PTA  01/07/2017, 12:24 PM

## 2017-01-07 NOTE — Progress Notes (Signed)
Physical Therapy Discharge Summary  Patient Details  Name: [name] MRN: [id number] Date of Birth: [birth year]  Today's Date: 01/07/2017 PT Individual Time: 1500-1600 PT Individual Time Calculation (min): 60 min    Patient has met 10 of 11 long term goals due to improved activity tolerance, improved balance, improved postural control, increased strength, improved attention, improved awareness and improved coordination.  Patient to discharge at a wheelchair level mod I, supervision/min assist for ambulation.   Patient's care partner is independent to provide the necessary physical assistance at discharge.  Reasons goals not met:  Did not assess floor transfer during LOS.   Recommendation:  Patient will benefit from ongoing skilled PT services in home health setting to continue to advance safe functional mobility, address ongoing impairments in balance, postural control, strength, and coordination, and minimize fall risk.  Equipment: 18x18 w/c with basic cushion  Reasons for discharge: treatment goals met  Patient/family agrees with progress made and goals achieved: Yes   Skilled PT Intervention: No c/o pain.  Session focus on d/c assessment, d/c education, and d/c planning.    Pt propels w/c throughout unit mod I.  Sit<>stand and stand/pivot with mod I throughout session.  Pt continues to require min assist and mod verbal cues on stairs and with ambulation with RW.    PT discussed continued progression of ambulation with therapy at d/c, and reviewed safety and fall precautions with pt once at home.  Pt verbalized understanding of all.  Wife and pt feel comfortable with hands on training from AM sessions.    Pt returned to room at end of session and positioned upright in w/c with call bell in reach and needs met.   PT Discharge Precautions/Restrictions Precautions Precautions: Fall Precaution Comments: ongoing L hemiparesis UE>LE Restrictions Weight Bearing Restrictions:  No Vision/Perception  Perception Perception: Within Functional Limits Praxis Praxis: Intact  Cognition Overall Cognitive Status: Within Functional Limits for tasks assessed Arousal/Alertness: Awake/alert Orientation Level: Oriented X4(cues for date) Selective Attention: Appears intact Memory: Appears intact Awareness: Appears intact Problem Solving: Appears intact Safety/Judgment: Appears intact Sensation Sensation Light Touch: Impaired Detail Light Touch Impaired Details: Impaired LLE(decreased sensation to LT on L5 dermatome) Proprioception: Appears Intact Coordination Gross Motor Movements are Fluid and Coordinated: No Fine Motor Movements are Fluid and Coordinated: No Coordination and Movement Description: ongoing LUE hemiparesis Motor  Motor Motor: Hemiplegia;Abnormal postural alignment and control Motor - Discharge Observations: L hemiparesis UE>LE  Mobility Bed Mobility Bed Mobility: Supine to Sit;Sit to Supine Supine to Sit: 6: Modified independent (Device/Increase time) Sit to Supine: 6: Modified independent (Device/Increase time) Transfers Transfers: Yes Sit to Stand: 6: Modified independent (Device/Increase time) Stand to Sit: 6: Modified independent (Device/Increase time) Stand Pivot Transfers: 6: Modified independent (Device/Increase time) Locomotion  Ambulation Ambulation: Yes Ambulation/Gait Assistance: 4: Min assist Ambulation Distance (Feet): 110 Feet Assistive device: Rolling walker Ambulation/Gait Assistance Details: Verbal cues for gait pattern Stairs / Additional Locomotion Stairs: Yes Stairs Assistance: 4: Min assist Stair Management Technique: Two rails Number of Stairs: 8 Height of Stairs: 3 Wheelchair Mobility Wheelchair Mobility: Yes Wheelchair Assistance: 6: Modified independent (Device/Increase time) Environmental health practitioner: Right upper extremity;Right lower extremity Wheelchair Parts Management: Supervision/cueing Distance: 150   Trunk/Postural Assessment  Cervical Assessment Cervical Assessment: Within Functional Limits Thoracic Assessment Thoracic Assessment: Exceptions to WFL(rounded shoulders) Lumbar Assessment Lumbar Assessment: Exceptions to WFL(posterior pelvic tilt) Postural Control Righting Reactions: intact Protective Responses: delayed  Balance Balance Balance Assessed: Yes Static Sitting Balance Static Sitting - Balance Support: Feet supported;No  upper extremity supported Static Sitting - Level of Assistance: 6: Modified independent (Device/Increase time) Dynamic Sitting Balance Dynamic Sitting - Balance Support: During functional activity Dynamic Sitting - Level of Assistance: 5: Stand by assistance Static Standing Balance Static Standing - Balance Support: During functional activity Static Standing - Level of Assistance: 5: Stand by assistance Dynamic Standing Balance Dynamic Standing - Balance Support: During functional activity Dynamic Standing - Level of Assistance: 4: Min assist Extremity Assessment      RLE Assessment RLE Assessment: Within Functional Limits(5/5 proximal to distal) LLE Assessment LLE Assessment: Exceptions to [facility] LLE Strength Left Hip Flexion: 3+/5 Left Knee Flexion: 2/5 Left Knee Extension: 4/5 Left Ankle Dorsiflexion: (not assessed 2/2 AFO) Left Ankle Plantar Flexion: (not assessed 2/2 AFO)   See Function Navigator for Current Functional Status.  [name] 01/07/2017, 5:01 PM

## 2017-01-07 NOTE — Progress Notes (Signed)
Social Work Patient ID: [name], male   DOB: April 17, 1965, 51 y.o.   MRN: [id number] Met with pt to discuss team conference progression toward his goals and readiness for discharge tomorrow. Wife has been in for training and both feel prepared to go home tomorrow. Have contacted [facility] regarding equipment delivery and follow up therapies.

## 2017-01-07 NOTE — Progress Notes (Signed)
Subjective/Complaints:  No right foot pain today   ROS: Denies nausea, vomiting, diarrhea, shortness of breath or chest pain   Objective: Vital Signs: Blood pressure 131/80, pulse 87, temperature 98.8 F (37.1 C), temperature source Oral, resp. rate 16, height _0  (1.6 m), weight 86 kg (189 lb 9.5 oz), SpO2 91 %. No results found. Results for orders placed or performed during the hospital encounter of 12/18/16 (from the past 72 hour(s))  Glucose, capillary     Status: None   Collection Time: 01/04/17 11:51 AM  Result Value Ref Range   Glucose-Capillary 95 65 - 99 mg/dL  Glucose, capillary     Status: Abnormal   Collection Time: 01/04/17  4:33 PM  Result Value Ref Range   Glucose-Capillary 135 (H) 65 - 99 mg/dL  Glucose, capillary     Status: None   Collection Time: 01/04/17  9:18 PM  Result Value Ref Range   Glucose-Capillary 95 65 - 99 mg/dL  Glucose, capillary     Status: Abnormal   Collection Time: 01/05/17  7:04 AM  Result Value Ref Range   Glucose-Capillary 106 (H) 65 - 99 mg/dL  Glucose, capillary     Status: Abnormal   Collection Time: 01/05/17 11:37 AM  Result Value Ref Range   Glucose-Capillary 143 (H) 65 - 99 mg/dL  Glucose, capillary     Status: Abnormal   Collection Time: 01/05/17  4:30 PM  Result Value Ref Range   Glucose-Capillary 111 (H) 65 - 99 mg/dL  Glucose, capillary     Status: Abnormal   Collection Time: 01/05/17  9:45 PM  Result Value Ref Range   Glucose-Capillary 126 (H) 65 - 99 mg/dL   Comment 1 Notify RN   Glucose, capillary     Status: Abnormal   Collection Time: 01/06/17  5:22 AM  Result Value Ref Range   Glucose-Capillary 108 (H) 65 - 99 mg/dL  Glucose, capillary     Status: Abnormal   Collection Time: 01/06/17 11:42 AM  Result Value Ref Range   Glucose-Capillary 143 (H) 65 - 99 mg/dL  Glucose, capillary     Status: Abnormal   Collection Time: 01/06/17  4:51 PM  Result Value Ref Range   Glucose-Capillary 105 (H) 65 - 99 mg/dL   Glucose, capillary     Status: Abnormal   Collection Time: 01/07/17  6:33 AM  Result Value Ref Range   Glucose-Capillary 106 (H) 65 - 99 mg/dL    Gen no apparent distress. Vital signs reviewed.  HEENT: Normocephalic, atraumatic. Cardio: RRR. No JVD   Resp: CTA Bilaterally. Normal effort  GI: BS positive and ND Musc/Skel:  No edema, no erythema, no pain with toe or foot ROM on Rplantar heel tenderness Neuro:  Motor LUE: Shoulder abduction 3/5, 2-/5 elbow flex/ext, 1/5 wrist/hand  LLE: HF, KE 4-/5, ADF 1+/5  Sensation intact to light touch Skin:   Intact. Warm and dry.   Assessment/Plan: 1. Functional deficits secondary to Right Brainstem pontine and medullary infarcts which require 3+ hours per day of interdisciplinary therapy in a comprehensive inpatient rehab setting. Physiatrist is providing close team supervision and 24 hour management of active medical problems listed below. Physiatrist and rehab team continue to assess barriers to discharge/monitor patient progress toward functional and medical goals. FIM: Function - Bathing Position: Shower Body parts bathed by patient: Left arm, Chest, Abdomen, Front perineal area, Right upper leg, Left upper leg, Buttocks, Right arm, Right lower leg, Left lower leg, Back Body parts bathed by  helper: Right arm Assist Level: Supervision or verbal cues, Assistive device(LHS and wash mit to L UE ) Assistive Device Comment: hand over hand for L UE facilitation  Function- Upper Body Dressing/Undressing What is the patient wearing?: Pull over shirt/dress Pull over shirt/dress - Perfomed by patient: Thread/unthread right sleeve, Thread/unthread left sleeve, Pull shirt over trunk, Put head through opening Pull over shirt/dress - Perfomed by helper: Thread/unthread left sleeve, Put head through opening, Pull shirt over trunk Assist Level: Supervision or verbal cues Set up : To obtain clothing/put away Function - Lower Body Dressing/Undressing What  is the patient wearing?: Pants, Shoes, Socks, AFO, Underwear Position: Wheelchair/chair at sink Underwear - Performed by patient: Thread/unthread right underwear leg, Thread/unthread left underwear leg, Pull underwear up/down Pants- Performed by patient: Thread/unthread right pants leg, Thread/unthread left pants leg, Pull pants up/down Pants- Performed by helper: Thread/unthread right pants leg, Pull pants up/down Non-skid slipper socks- Performed by patient: Don/doff right sock, Don/doff left sock Non-skid slipper socks- Performed by helper: Don/doff right sock Socks - Performed by patient: Don/doff right sock Socks - Performed by helper: Don/doff right sock, Don/doff left sock Shoes - Performed by patient: Don/doff right shoe Shoes - Performed by helper: Don/doff right shoe, Don/doff left shoe AFO - Performed by helper: Don/doff left AFO Assist for footwear: Partial/moderate assist Assist for lower body dressing: Supervision or verbal cues  Function - Toileting Toileting activity did not occur: (No BM's, used urinal) Toileting steps completed by patient: Adjust clothing prior to toileting, Performs perineal hygiene, Adjust clothing after toileting Toileting steps completed by helper: Adjust clothing after toileting, Adjust clothing prior to toileting Toileting Assistive Devices: Grab bar or rail Assist level: Touching or steadying assistance (Pt.75%)  Function - Air cabin crew transfer activity did not occur: Safety/medical concerns Toilet transfer assistive device: Grab bar, Elevated toilet seat/BSC over toilet Mechanical lift: Stedy Assist level to toilet: Touching or steadying assistance (Pt > 75%) Assist level from toilet: Touching or steadying assistance (Pt > 75%)  Function - Chair/bed transfer Chair/bed transfer activity did not occur: N/A Chair/bed transfer method: Stand pivot Chair/bed transfer assist level: Touching or steadying assistance (Pt > 75%) Chair/bed  transfer assistive device: Armrests Chair/bed transfer details: Verbal cues for sequencing  Function - Locomotion: Wheelchair Type: Manual Max wheelchair distance: 168f  Assist Level: No help, No cues, assistive device, takes more than reasonable amount of time Assist Level: No help, No cues, assistive device, takes more than reasonable amount of time Wheel 150 feet activity did not occur: Safety/medical concerns Assist Level: No help, No cues, assistive device, takes more than reasonable amount of time Turns around,maneuvers to table,bed, and toilet,negotiates 3% grade,maneuvers on rugs and over doorsills: No Function - Locomotion: Ambulation Assistive device: Walker-rolling, Orthosis Max distance: 40 Assist level: Moderate assist (Pt 50 - 74%) Assist level: Touching or steadying assistance (Pt > 75%) Walk 50 feet with 2 turns activity did not occur: Safety/medical concerns Walk 150 feet activity did not occur: Safety/medical concerns Walk 10 feet on uneven surfaces activity did not occur: Safety/medical concerns  Function - Comprehension Comprehension: Auditory Comprehension assist level: Understands basic 90% of the time/cues < 10% of the time  Function - Expression Expression: Verbal Expression assist level: Expresses complex 90% of the time/cues < 10% of the time  Function - Social Interaction Social Interaction assist level: Interacts appropriately 90% of the time - Needs monitoring or encouragement for participation or interaction.  Function - Problem Solving Problem solving assist level: Solves complex  90% of the time/cues < 10% of the time  Function - Memory Memory assist level: Recognizes or recalls 90% of the time/requires cueing < 10% of the time Patient normally able to recall (first 3 days only): Current season, Location of own room, Staff names and faces, That he or she is in a hospital  Medical Problem List and Plan: 1.Left side weakness/dysphagiasecondary  to right paramedian brainstem infarct/pons/Medullaas well as history of calcified chronic brain mass: onset 11/30, ASA 342m per day   Cont CIR, Team conference today please see physician documentation under team conference tab, met with team face-to-face to discuss problems,progress, and goals. Formulized individual treatment plan based on medical history, underlying problem and comorbidities.   WHO/PRAFO, ROM   2. DVT Prophylaxis/Anticoagulation: Subcutaneous Lovenox. Monitor for any bleeding episodes 3. Pain Management:Tylenol as needed 4. Mood:Provide emotional support, brighter on fluoxetine 171m5. Neuropsych: This patientiscapable of making decisions on hisown behalf. 6. Skin/Wound Care:Routine skin checks 7. Fluids/Electrolytes/Nutrition:Routine I&O's   BMP within acceptable range on 12/20 8.Hypertension. Norvasc 10 mg daily, Coreg 25 mg twice a day, Avapro 300 mg daily. Monitor with increased mobility  Vitals:   01/06/17 1500 01/07/17 0521  BP: 119/76 131/80  Pulse: 87 87  Resp: 20 16  Temp: 98.7 F (37.1 C) 98.8 F (37.1 C)  SpO2: 99% 91%     12/26 controlled 9.Hyperlipidemia. Lipitor 10.Diabetes mellitus peripheral neuropathy. Hemoglobin A1c 6.2. SSI. Check blood sugars before meals and at bedtime. Patient on Janumet-XR100-1000 milligrams daily. Resume as needed CBG (last 3)  Recent Labs    01/06/17 1142 01/06/17 1651 01/07/17 0633  GLUCAP 143* 105* 106*    12/25 controlled,  11.  Hx rectal cancer with chemo and radiation in remission 12.  Spastic bladder: improving with oxybutnin suspect CVA related spastic bladder  Improved, no nocturia 13. ABLA  Hb 11.5 on 12/20   Cont to monitor 14.  Right plantar foot pain- plantar fasciitis, cont PT, achilles stretch, ice, no NSAIDs due to CVA  LOS (Days) 20 A FACE TO FACE EVALUATION WAS PERFORMED  AnCharlett Blake2/26/2018, 8:00 AM

## 2017-01-07 NOTE — Discharge Summary (Signed)
[name], [name] NO.:  [id number]  MEDICAL RECORD NO.:  [id number]  LOCATION:                                 FACILITY:  PHYSICIAN:  [name], M.D.DATE OF BIRTH:  Oct 11, 1965  DATE OF ADMISSION:  12/18/2016 DATE OF DISCHARGE:  01/08/2017                              DISCHARGE SUMMARY   DISCHARGE DIAGNOSES: 1. Right paramedian brainstem infarction - pons and medulla. 2. Subcutaneous Lovenox for deep venous thrombosis prophylaxis. 3. Hypertension. 4. Hyperlipidemia. 5. Diabetes mellitus. 6. Peripheral neuropathy. 7. Spastic bladder. 8. History of rectal [name] with chemoradiation therapy. 9. Right plantar foot pain.  HISTORY OF PRESENT ILLNESS:  This is a 51 year old right-handed male with history of hypertension, diabetes mellitus, and chronic calcified brain mass likely meningioma, lives with spouse independent prior to admission.  Wife works during the day.  Presented on 12/12/2016 with left-sided weakness.  Noted blood pressure 240/140.  The patient did report he ran out of his medications a few months ago with financial constraints.  Cranial CT scan unremarkable.  Stable right medial frontal region calcified mass, vasogenic edema right frontal lobe.  The patient did not receive tPA.  CT angiogram of the head and neck negative for large proximal arterial branch occlusion.  MRI showed acute right paramedian brainstem infarct affecting lower pons and upper medulla. Echocardiogram with ejection fraction of 42%, grade 1 diastolic dysfunction, no emboli.  Maintained on aspirin for CVA prophylaxis. Subcutaneous Lovenox for DVT prophylaxis.  Diet slowly advanced. Physical and occupational therapy ongoing.  The patient was admitted for a comprehensive rehab program.  PAST MEDICAL HISTORY:  See discharge diagnoses.  SOCIAL HISTORY:  Lives with spouse, independent prior to admission.  FUNCTIONAL STATUS:  Upon admission to rehab services was +2  physical assist to sit to stand, max assist to stand and pivot transfers, max total assist to activities of daily living.  PHYSICAL EXAMINATION:  VITAL SIGNS: Blood pressure 176/105, pulse 77, temperature 98, and respirations 18. GENERAL: Alert male, in no acute distress. HEENT: EOMs intact. NECK: Supple.  Nontender.  No JVD. CARDIAC: Rate controlled. ABDOMEN: Soft, nontender.  Good bowel sounds. LUNGS: Clear to auscultation. EXTREMITIES: Left upper extremity 2-/5 for horizontal adduction, 2- at the biceps, 0/5 at triceps.  Left lower extremity 4-/5 at hip flexors and knee extension and 0/5 at the ankle.  REHABILITATION HOSPITAL COURSE:  The patient was admitted to inpatient rehab services with therapies initiated on a 3-hour daily basis, consisting of physical therapy, occupational therapy, and rehabilitation nursing.  The following issues were addressed during the patient's rehabilitation stay.  Pertaining to [name]'s right paramedian brainstem infarction remained stable maintained on aspirin therapy, he would follow up Neurology Services.  Subcutaneous Lovenox for DVT prophylaxis.  No bleeding episodes.  Blood pressures controlled and monitored on Norvasc as well as Avapro and Coreg.  He would follow up with his primary MD.  Diabetes mellitus, peripheral neuropathy. Hemoglobin A1c of 6.2.  The patient had been on Janumet prior to admission but remained on hold with blood sugars well controlled 90-1 12.  Full diabetic teaching and discussed following up with primary care provider on resuming home Janumet as needed.  Spastic bladder improved with the use of Ditropan.  No nocturia.  The patient received weekly collaborative interdisciplinary team conferences to discuss estimated length of stay, family teaching, any barriers to discharge.  Propels wheelchair throughout the unit with hemi technique modified independence.  Transfers to therapy mat with stand pivot and  ultimately needing minimal assistance.  Lateral scoot back to wheelchair with supervision.  Ambulating 80 feet with assistive device.  Sit to stand throughout session with supervision, gather belongings for activities of daily living and homemaking, working with energy conservation techniques.  Sit to wheelchair transfers for activities daily living, minimal assistance.  Full family teaching was completed and plan discharge to home.  DISCHARGE MEDICATIONS:  Included Norvasc 10 mg p.o. daily, aspirin 325 mg p.o. daily, Lipitor 80 mg p.o. daily, Coreg 25 mg p.o. b.i.d., Prozac 10 mg p.o. daily, Avapro 300 mg p.o. daily, melatonin 9 mg p.o. at bedtime, and Ditropan 2.5 mg p.o. daily.  Patient's Janumet remained on hold with blood sugars 90-112. Patient will continue to monitor blood sugars and resume as needed  DIET:  His diet was a diabetic diet.  FOLLOWUP:  He would follow up with Dr. Alysia [name] at the [facility] as advised; Dr. Kathie [name]. [name], Neurology Service, call for appointment; Dr. Glendale [name], medical management.     [name], P.A.   ______________________________ [name], M.D.    DA/MEDQ  D:  01/07/2017  T:  01/07/2017  Job:  [id number]  cc:   Mark P. Mark Man, MD [name]. [name], M.D. [name], M.D.

## 2017-01-07 NOTE — Plan of Care (Signed)
Goals completed on 12/26 for dc

## 2017-01-07 NOTE — Progress Notes (Signed)
Social Work  Discharge Note  The overall goal for the admission was met for:   Discharge location: [place]  Length of Stay: Yes-21 DAYS  Discharge activity level: Yes-MOD/I [place]  Home/community participation: Yes  Services provided included: MD, RD, PT, OT, SLP, RN, CM, TR, Pharmacy, Neuropsych and SW  Financial Services: Other: PENDING MEDICAID  Follow-up services arranged: Home Health: [facility] CARE-PT,OT ,SW, DME: [facility] and Patient/Family has no preference for HH/DME agencies added bedside commode  Comments (or additional information):PT DID VERY WELL AND REACHED MOD/I WHEELCHAIR LEVEL AND SUPERVISION WITH AMBULATION. [facility] PCP APPOINTMENT 1/3 @ 10:30 AM. MATCH PROGRAM GIVEN TO PT FOR MEDICATION ASSISTANCE.   Patient/Family verbalized understanding of follow-up arrangements: Yes  Individual responsible for coordination of the follow-up plan: [facility]  Confirmed correct DME delivered: Elease Hashimoto 01/07/2017    Elease Hashimoto

## 2017-01-07 NOTE — Discharge Summary (Signed)
Discharge summary job [phone]

## 2017-01-07 NOTE — Progress Notes (Signed)
Occupational Therapy Session Note  Patient Details  Name: [name] MRN: [id number] Date of Birth: 12-May-1965  Today's Date: 01/07/2017 OT Individual Time: 1300-1400 OT Individual Time Calculation (min): 60 min    Short Term Goals: Week 3:  OT Short Term Goal 1 (Week 3): STG=LTG 2/2 ELOS  Skilled Therapeutic Interventions/Progress Updates:    Treatment session focused on ADLs/self care training, transfer training, pt education, balance training, hemiplegia ADl techniques, and discharge planning.  Upon entering, pt resting in wheelchair and agreeable to AM ADLs. Pt requested to put on the clothes he was wearing after shower. Pt self propelled into bathroom with min A over ledge and for transfer positioning into shower. Pt completed W/c<>SB transfer with S and v/c for hand/foot placement. Pt doffed all clothing with S in sit<>stand level with grab bars using hemiplegic dressing techniques. Pt donned wash mit with minA and completed bathing tasks with S. Pt requested A for washing under R arm and required [facility] for thoroughness. Pt completed dressing at w/c level with min A for donning sweatshirt and mod A for donning R sock and L shoe/AFO. Therapist and pt review pt's progress and plan for returning home. Pt agrees to recommendations for home health therapy and assistance in ADLs and transfers for safety. No c/o pain at this time.   Therapy Documentation Precautions:  Precautions Precautions: Fall Precaution Comments: ongoing L hemiparesis UE>LE Restrictions Weight Bearing Restrictions: No Vital Signs: Therapy Vitals Temp: 98.4 F (36.9 C) Temp Source: Oral Pulse Rate: 90 Resp: 18 BP: 117/73 Patient Position (if appropriate): Sitting Oxygen Therapy SpO2: 100 % O2 Device: Not Delivered Pain: Pain Assessment Pain Assessment: No/denies pain Pain Score: 4  ADL: ADL ADL Comments: Please see functional navigato Vision Baseline Vision/History: Wears glasses Wears Glasses:  At all times Patient Visual Report: No change from baseline Vision Assessment?: No apparent visual deficits Perception  Perception: Within Functional Limits Praxis Praxis: Intact  See Function Navigator for Current Functional Status.   Therapy/Group: Individual Therapy  Delon [place] 01/07/2017, 3:37 PM

## 2017-01-07 NOTE — Patient Care Conference (Signed)
Inpatient RehabilitationTeam Conference and Plan of Care Update Date: 01/07/2017   Time: 10:30 AM    Patient Name: [name]      Medical Record Number: [id number]  Date of Birth: [birth year] Sex: Male         Room/Bed: 4M06C/4M06C-01 Payor Info: Payor: MEDICAID PENDING / Plan: MEDICAID PENDING / Product Type: *No Product type* /    Admitting Diagnosis: R CVA  Admit Date/Time:  12/18/2016  4:37 PM Admission Comments: No comment available   Primary Diagnosis:  Left hemiparesis ([place]) Principal Problem: Left hemiparesis [facility])  Patient Active Problem List   Diagnosis Date Noted  . Labile blood pressure   . Labile blood glucose   . Acute blood loss anemia   . Benign essential HTN   . Neurogenic bladder due to old stroke 12/25/2016  . Right pontine cerebrovascular accident ([place]) 12/18/2016  . Left hemiparesis ([place])   . Diastolic dysfunction   . Dysphagia, post-stroke   . AKI (acute kidney injury) ([place])   . Prediabetes   . Acute ischemic stroke ([place]) 12/12/2016  . Acute respiratory failure with hypercapnia ([place])   . Seizures ([place]) 07/22/2014  . Hypertension 07/22/2014  . Hyperglycemia 07/22/2014  . Type 2 diabetes mellitus with vascular disease ([place]) 07/22/2014  . Pyrexia   . Brain mass   . Malignant neoplasm of rectum ([place]) 09/02/2007    Expected Discharge Date: Expected Discharge Date: 01/08/17  Team Members Present: Physician leading conference: Dr. Alysia Penna Social Worker Present: Ovidio Kin, LCSW Nurse Present: Brita Romp, RN PT Present: Dwyane Dee, PT OT Present: Willeen Cass, OT SLP Present: Charolett Bumpers, SLP PPS Coordinator present : Daiva Nakayama, RN, CRRN     Current Status/Progress Goal Weekly Team Focus  Medical   Left hemiparesis, dysphagia, BP and DM controlled  reduce fall risk, reduced bladder freq  D/C planning   Bowel/Bladder   continent of B/B most of the time; urinal at night; LBM 12/25  maintain continence   toilet/offer urinal q 3-4hrs and as needed   Swallow/Nutrition/ Hydration             ADL's   overall at goal level   Mod I /supervision   d/c planning and safety at home tips, standing balance and activity tolerance   Mobility   mod I for stand/pivot and w/c propulsion, min<>supervision for gait  mod I transfers and w/c mobility, supervision for gait with LRAD  LLE NMR, balance, activity tolerance, gait, d/c planning, family education   Communication             Safety/Cognition/ Behavioral Observations  no current safety issues  free from injury with min assist  safe transfers with appropriate equipment per safety plan   Pain   no c/o  maintain no c/o pain  assess pain q shift and prn   Skin   CDI  maintain CDI  assess skin q shift and prn      *See Care Plan and progress notes for long and short-term goals.     Barriers to Discharge  Current Status/Progress Possible Resolutions Date Resolved   Physician    Decreased caregiver support     progressing toward goals  meds for bladder, set up post d/c therapies      Nursing                  PT  OT                  SLP                SW                Discharge Planning/Teaching Needs:  Wife here this week to learn pt's care. Needs to be mod/i wheelchair to be safe at home while wife is working.      Team Discussion:  Progressing toward his goals of mod/i-wheelchair level and supervision level with ambulation. Ankle pain better-planter. Family education with wife today going well. BP and DM controlled and medically ready for discharge tomorrow.  Revisions to Treatment Plan:  DC 12/27    Continued Need for Acute Rehabilitation Level of Care: The patient requires daily medical management by a physician with specialized training in physical medicine and rehabilitation for the following conditions: Daily direction of a multidisciplinary physical rehabilitation program to ensure safe treatment while  eliciting the highest outcome that is of practical value to the patient.: Yes Daily medical management of patient stability for increased activity during participation in an intensive rehabilitation regime.: Yes Daily analysis of laboratory values and/or radiology reports with any subsequent need for medication adjustment of medical intervention for : Neurological problems;Diabetes problems;Blood pressure problems  Silvana Holecek, Gardiner Rhyme 01/07/2017, 3:08 PM

## 2017-01-08 ENCOUNTER — Inpatient Hospital Stay (HOSPITAL_COMMUNITY): Payer: Self-pay | Admitting: Physical Therapy

## 2017-01-08 LAB — CREATININE, SERUM
Creatinine, Ser: 1.16 mg/dL (ref 0.61–1.24)
GFR calc Af Amer: >60 mL/min (ref >60)
GFR calc non Af Amer: >60 mL/min (ref >60)

## 2017-01-08 LAB — GLUCOSE, CAPILLARY: Glucose-Capillary: 99 mg/dL (ref 65–99)

## 2017-01-08 MED ORDER — CARVEDILOL 25 MG PO TABS: 25.0000 mg | ORAL_TABLET | Freq: Two times a day (BID) | ORAL | 0 refills | Status: DC

## 2017-01-08 MED ORDER — AMLODIPINE BESYLATE 10 MG PO TABS: 10.0000 mg | ORAL_TABLET | Freq: Every day | ORAL | 1 refills | Status: DC

## 2017-01-08 MED ORDER — VALSARTAN 160 MG PO TABS: 160.0000 mg | ORAL_TABLET | Freq: Every day | ORAL | 0 refills | Status: DC

## 2017-01-08 MED ORDER — OXYBUTYNIN CHLORIDE 5 MG PO TABS: 2.5000 mg | ORAL_TABLET | Freq: Two times a day (BID) | ORAL | 0 refills | Status: DC

## 2017-01-08 MED ORDER — MELATONIN 3 MG PO TABS: 9.0000 mg | ORAL_TABLET | Freq: Every day | ORAL | 0 refills | Status: DC

## 2017-01-08 MED ORDER — FLUOXETINE HCL 10 MG PO CAPS: 10.0000 mg | ORAL_CAPSULE | Freq: Every day | ORAL | 3 refills | Status: DC

## 2017-01-08 MED ORDER — ATORVASTATIN CALCIUM 80 MG PO TABS: 80.0000 mg | ORAL_TABLET | Freq: Every day | ORAL | 0 refills | Status: DC

## 2017-01-08 NOTE — Discharge Instructions (Signed)
Inpatient Rehab Discharge Instructions  [name] Discharge date and time: No discharge date for patient encounter.   Activities/Precautions/ Functional Status: Activity: activity as tolerated Diet: diabetic diet Wound Care: none needed Functional status:  ___ No restrictions     ___ Walk up steps independently ___ 24/7 supervision/assistance   ___ Walk up steps with assistance ___ Intermittent supervision/assistance  ___ Bathe/dress independently ___ Walk with walker     _x STROKE/TIA DISCHARGE INSTRUCTIONS SMOKING Cigarette smoking nearly doubles your risk of having a stroke & is the single most alterable risk factor  If you smoke or have smoked in the last 12 months, you are advised to quit smoking for your health.  Most of the excess cardiovascular risk related to smoking disappears within a year of stopping.  Ask you doctor about anti-smoking medications  [place] Quit Line: 1-800-QUIT NOW  Free Smoking Cessation Classes [phone]  CHOLESTEROL Know your levels; limit fat & cholesterol in your diet  Lipid Panel     Component Value Date/Time   CHOL 227 (H) 12/13/2016 0421   TRIG 77 12/13/2016 0421   HDL 58 12/13/2016 0421   CHOLHDL 3.9 12/13/2016 0421   VLDL 15 12/13/2016 0421   LDLCALC 154 (H) 12/13/2016 0421      Many patients benefit from treatment even if their cholesterol is at goal.  Goal: Total Cholesterol (CHOL) less than 160  Goal:  Triglycerides (TRIG) less than 150  Goal:  HDL greater than 40  Goal:  LDL (LDLCALC) less than 100   BLOOD PRESSURE American Stroke Association blood pressure target is less that 120/80 mm/Hg  Your discharge blood pressure is:  BP: (!) 147/86  Monitor your blood pressure  Limit your salt and alcohol intake  Many individuals will require more than one medication for high blood pressure  DIABETES (A1c is a blood sugar average for last 3 months) Goal HGBA1c is under 7% (HBGA1c is blood sugar average for last 3 months)    Diabetes:     Lab Results  Component Value Date   HGBA1C 6.2 (H) 12/13/2016     Your HGBA1c can be lowered with medications, healthy diet, and exercise.  Check your blood sugar as directed by your physician  Call your physician if you experience unexplained or low blood sugars.  PHYSICAL ACTIVITY/REHABILITATION Goal is 30 minutes at least 4 days per week  Activity: Increase activity slowly, Therapies: Physical Therapy: Home Health Return to work:   Activity decreases your risk of heart attack and stroke and makes your heart stronger.  It helps control your weight and blood pressure; helps you relax and can improve your mood.  Participate in a regular exercise program.  Talk with your doctor about the best form of exercise for you (dancing, walking, swimming, cycling).  DIET/WEIGHT Goal is to maintain a healthy weight  Your discharge diet is: Diet Carb Modified Fluid consistency: Thin; Room service appropriate? Yes  liquids Your height is:  Height: 5\' 3"  (160 cm) Your current weight is: Weight: 86 kg (189 lb 9.5 oz) Your Body Mass Index (BMI) is:  BMI (Calculated): 33.59  Following the type of diet specifically designed for you will help prevent another stroke.  Your goal weight range is:    Your goal Body Mass Index (BMI) is 19-24.  Healthy food habits can help reduce 3 risk factors for stroke:  High cholesterol, hypertension, and excess weight.  RESOURCES Stroke/Support Group:  Call [facility]  TO PATIENT Stroke warning signs and symptoms How to activate emergency medical system (call 911). Medications prescribed at discharge. Need for follow-up after discharge. Personal risk factors for stroke. Pneumonia vaccine given:  Flu vaccine given:  My questions have been answered, the writing is legible, and I understand these instructions.  I will adhere to these goals & educational materials that have been provided to me after my  discharge from the hospital.   __ Bathe/dress with assistance ___ Walk Independently    ___ Shower independently ___ Walk with assistance    ___ Shower with assistance ___ No alcohol     ___ Return to work/school ________  Special Instructions:    COMMUNITY REFERRALS UPON DISCHARGE:    Home Health:   PT, OT, [place]   Date of last service:01/08/2017  Medical Equipment/Items Ordered:WHEELCHAIR, [place] RESOURCES FOR PATIENT/FAMILY: Support Groups:[facility] SUPPORT GROUP EVERY SECOND Thursday @ 3:00-4:00 PM ON THE [place] [phone]  My questions have been answered and I understand these instructions. I will adhere to these goals and the provided educational materials after my discharge from the hospital.  Patient/Caregiver Signature _______________________________ Date __________  Clinician Signature _______________________________________ Date __________  Please bring this form and your medication list with you to all your follow-up doctor's appointments.

## 2017-01-08 NOTE — Progress Notes (Signed)
Pt discharged to home with family. All equipment delivered and discharge instructions were given by PA.

## 2017-01-08 NOTE — Progress Notes (Signed)
Subjective/Complaints:  No problems overnite, discussed need for PCP  ROS: Denies nausea, vomiting, diarrhea, shortness of breath or chest pain   Objective: Vital Signs: Blood pressure (!) 145/75, pulse 87, temperature 98.8 F (37.1 C), temperature source Oral, resp. rate 18, height _0  (1.6 m), weight 86 kg (189 lb 9.5 oz), SpO2 97 %. No results found. Results for orders placed or performed during the hospital encounter of 12/18/16 (from the past 72 hour(s))  Glucose, capillary     Status: Abnormal   Collection Time: 01/05/17 11:37 AM  Result Value Ref Range   Glucose-Capillary 143 (H) 65 - 99 mg/dL  Glucose, capillary     Status: Abnormal   Collection Time: 01/05/17  4:30 PM  Result Value Ref Range   Glucose-Capillary 111 (H) 65 - 99 mg/dL  Glucose, capillary     Status: Abnormal   Collection Time: 01/05/17  9:45 PM  Result Value Ref Range   Glucose-Capillary 126 (H) 65 - 99 mg/dL   Comment 1 Notify RN   Glucose, capillary     Status: Abnormal   Collection Time: 01/06/17  5:22 AM  Result Value Ref Range   Glucose-Capillary 108 (H) 65 - 99 mg/dL  Glucose, capillary     Status: Abnormal   Collection Time: 01/06/17 11:42 AM  Result Value Ref Range   Glucose-Capillary 143 (H) 65 - 99 mg/dL  Glucose, capillary     Status: Abnormal   Collection Time: 01/06/17  4:51 PM  Result Value Ref Range   Glucose-Capillary 105 (H) 65 - 99 mg/dL  Glucose, capillary     Status: Abnormal   Collection Time: 01/07/17  6:33 AM  Result Value Ref Range   Glucose-Capillary 106 (H) 65 - 99 mg/dL  Glucose, capillary     Status: None   Collection Time: 01/07/17 11:38 AM  Result Value Ref Range   Glucose-Capillary 90 65 - 99 mg/dL  Glucose, capillary     Status: Abnormal   Collection Time: 01/07/17  4:39 PM  Result Value Ref Range   Glucose-Capillary 112 (H) 65 - 99 mg/dL  Creatinine, serum     Status: None   Collection Time: 01/08/17  5:13 AM  Result Value Ref Range   Creatinine, Ser 1.16  0.61 - 1.24 mg/dL   GFR calc non Af Amer >60 >60 mL/min   GFR calc Af Amer >60 >60 mL/min    Comment: (NOTE) The eGFR has been calculated using the CKD EPI equation. This calculation has not been validated in all clinical situations. eGFR's persistently <60 mL/min signify possible Chronic Kidney Disease.   Glucose, capillary     Status: None   Collection Time: 01/08/17  7:02 AM  Result Value Ref Range   Glucose-Capillary 99 65 - 99 mg/dL    Gen no apparent distress. Vital signs reviewed.  HEENT: Normocephalic, atraumatic. Cardio: RRR. No JVD   Resp: CTA Bilaterally. Normal effort  GI: BS positive and ND Musc/Skel:  No edema, no erythema, no pain with toe or foot ROM on Rplantar heel tenderness Neuro:  Motor LUE: Shoulder abduction 3/5, 2-/5 elbow flex/ext, 1/5 wrist/hand  LLE: HF, KE 4-/5, ADF 1+/5  Sensation intact to light touch Skin:   Intact. Warm and dry.   Assessment/Plan: 1. Functional deficits secondary to Right Brainstem pontine and medullary infarcts  Stable for D/C today F/u PCP in 3-4 weeks F/u PM&R 2 weeks See D/C summary See D/C instructions FIM: Function - Bathing Position: Shower Body parts bathed by  patient: Left arm, Chest, Abdomen, Front perineal area, Right upper leg, Left upper leg, Buttocks, Right arm, Right lower leg, Left lower leg, Back Body parts bathed by helper: Right arm Assist Level: Supervision or verbal cues, Assistive device Assistive Device Comment: hand over hand for L UE facilitation with wash mit   Function- Upper Body Dressing/Undressing What is the patient wearing?: Pull over shirt/dress Pull over shirt/dress - Perfomed by patient: Thread/unthread right sleeve, Thread/unthread left sleeve, Pull shirt over trunk, Put head through opening Pull over shirt/dress - Perfomed by helper: Thread/unthread left sleeve(assistance needed with sweatshirt/long sleeve ) Assist Level: Touching or steadying assistance(Pt > 75%) Set up : To obtain  clothing/put away Function - Lower Body Dressing/Undressing What is the patient wearing?: Pants, Shoes, Socks, AFO, Underwear Position: Wheelchair/chair at sink Underwear - Performed by patient: Thread/unthread right underwear leg, Thread/unthread left underwear leg, Pull underwear up/down Pants- Performed by patient: Thread/unthread right pants leg, Thread/unthread left pants leg, Pull pants up/down Pants- Performed by helper: Thread/unthread right pants leg, Pull pants up/down Non-skid slipper socks- Performed by patient: Don/doff right sock, Don/doff left sock Non-skid slipper socks- Performed by helper: Don/doff right sock Socks - Performed by patient: Don/doff left sock Socks - Performed by helper: Don/doff right sock Shoes - Performed by patient: Don/doff right shoe Shoes - Performed by helper: Don/doff left shoe AFO - Performed by helper: Don/doff left AFO Assist for footwear: Partial/moderate assist Assist for lower body dressing: Supervision or verbal cues  Function - Toileting Toileting activity did not occur: (No BM's, used urinal) Toileting steps completed by patient: Adjust clothing prior to toileting, Performs perineal hygiene, Adjust clothing after toileting Toileting steps completed by helper: Adjust clothing after toileting, Adjust clothing prior to toileting Toileting Assistive Devices: Grab bar or rail Assist level: Touching or steadying assistance (Pt.75%)  Function - Air cabin crew transfer activity did not occur: Safety/medical concerns Toilet transfer assistive device: Grab bar, Elevated toilet seat/BSC over toilet Mechanical lift: Stedy Assist level to toilet: Touching or steadying assistance (Pt > 75%) Assist level from toilet: Touching or steadying assistance (Pt > 75%)  Function - Chair/bed transfer Chair/bed transfer activity did not occur: N/A Chair/bed transfer method: Stand pivot Chair/bed transfer assist level: No Help, no cues, assistive  device, takes more than a reasonable amount of time Chair/bed transfer assistive device: Armrests Chair/bed transfer details: Verbal cues for sequencing  Function - Locomotion: Wheelchair Will patient use wheelchair at discharge?: Yes Type: Manual Max wheelchair distance: 132f Assist Level: No help, No cues, assistive device, takes more than reasonable amount of time Assist Level: No help, No cues, assistive device, takes more than reasonable amount of time Wheel 150 feet activity did not occur: Safety/medical concerns Assist Level: No help, No cues, assistive device, takes more than reasonable amount of time Turns around,maneuvers to table,bed, and toilet,negotiates 3% grade,maneuvers on rugs and over doorsills: Yes Function - Locomotion: Ambulation Assistive device: Walker-rolling, Orthosis Max distance: 110 Assist level: Touching or steadying assistance (Pt > 75%) Assist level: Supervision or verbal cues Walk 50 feet with 2 turns activity did not occur: Safety/medical concerns Assist level: Touching or steadying assistance (Pt > 75%) Walk 150 feet activity did not occur: Safety/medical concerns Walk 10 feet on uneven surfaces activity did not occur: Safety/medical concerns  Function - Comprehension Comprehension: Auditory Comprehension assist level: Understands basic 90% of the time/cues < 10% of the time  Function - Expression Expression: Verbal Expression assist level: Expresses complex 90% of the time/cues < 10%  of the time  Function - Social Interaction Social Interaction assist level: Interacts appropriately 90% of the time - Needs monitoring or encouragement for participation or interaction.  Function - Problem Solving Problem solving assist level: Solves complex 90% of the time/cues < 10% of the time  Function - Memory Memory assist level: Recognizes or recalls 90% of the time/requires cueing < 10% of the time Patient normally able to recall (first 3 days only):  Current season, Location of own room, Staff names and faces, That he or she is in a hospital  Medical Problem List and Plan: 1.Left side weakness/dysphagiasecondary to right paramedian brainstem infarct/pons/Medullaas well as history of calcified chronic brain mass: onset 11/30, ASA 34m per day   Needs PCP for f/u   2. DVT Prophylaxis/Anticoagulation: Subcutaneous Lovenox. Monitor for any bleeding episodes 3. Pain Management:Tylenol as needed 4. Mood:Provide emotional support, brighter on fluoxetine 118m5. Neuropsych: This patientiscapable of making decisions on hisown behalf. 6. Skin/Wound Care:Routine skin checks 7. Fluids/Electrolytes/Nutrition:Routine I&O's   BMP within acceptable range on 12/20 8.Hypertension. Norvasc 10 mg daily, Coreg 25 mg twice a day, Avapro 300 mg daily. Monitor with increased mobility  Vitals:   01/07/17 1709 01/08/17 0500  BP: 135/76 (!) 145/75  Pulse: 88 87  Resp:  18  Temp:  98.8 F (37.1 C)  SpO2:  97%     12/27 controlled 9.Hyperlipidemia. Lipitor 10.Diabetes mellitus peripheral neuropathy. Hemoglobin A1c 6.2. SSI. Check blood sugars before meals and at bedtime. Patient on Janumet-XR100-1000 milligrams daily. Resume as needed CBG (last 3)  Recent Labs    01/07/17 1138 01/07/17 1639 01/08/17 0702  GLUCAP 90 112* 99    12/27 controlled,  11.  Hx rectal cancer with chemo and radiation in remission 12.  Spastic bladder: improving with oxybutnin suspect CVA related spastic bladder  Improved, no nocturia 13. ABLA  Hb 11.5 on 12/20   Cont to monitor 14.  Right plantar foot pain- plantar fasciitis, cont PT, achilles stretch, ice, no NSAIDs due to CVA  LOS (Days) 21 A FACE TO FACE EVALUATION WAS PERFORMED  AnCharlett Blake2/27/2018, 7:13 AM

## 2017-01-14 ENCOUNTER — Telehealth: Payer: Self-pay | Admitting: Neurology

## 2017-01-14 NOTE — Telephone Encounter (Signed)
[name] [facility] [phone] called request VO for PT 2 x 4. She is aware Dr Leonie Man is not in the office and this will be sent to work in provider

## 2017-01-14 NOTE — Telephone Encounter (Signed)
Spoke with Tharon Aquas and gave V/O for PT twice per wk. for 4 wks/fim

## 2017-01-15 ENCOUNTER — Telehealth: Payer: Self-pay | Admitting: Internal Medicine

## 2017-01-15 ENCOUNTER — Ambulatory Visit: Payer: Self-pay | Attending: Internal Medicine | Admitting: Physician Assistant

## 2017-01-15 ENCOUNTER — Telehealth: Payer: Self-pay

## 2017-01-15 ENCOUNTER — Other Ambulatory Visit: Payer: Self-pay

## 2017-01-15 VITALS — BP 125/81 | HR 88 | Temp 98.7°F | Resp 16

## 2017-01-15 DIAGNOSIS — I10 Essential (primary) hypertension: Secondary | ICD-10-CM

## 2017-01-15 DIAGNOSIS — I639 Cerebral infarction, unspecified: Secondary | ICD-10-CM

## 2017-01-15 DIAGNOSIS — W19XXXA Unspecified fall, initial encounter: Secondary | ICD-10-CM

## 2017-01-15 DIAGNOSIS — E119 Type 2 diabetes mellitus without complications: Secondary | ICD-10-CM

## 2017-01-15 NOTE — Telephone Encounter (Signed)
Call placed to patient's medical insurance [employer] and they informed me that patient's medical coverage has been inactive since 01/13/2016.

## 2017-01-15 NOTE — Progress Notes (Signed)
[name]  FBP:[id number]  OEU:[id number]  DOB - October 27, 1965  Chief Complaint  Patient presents with  . Hospitalization Follow-up       Subjective:   [name] is a 52 y.o. male here today for establishment of care. He has a past medical history of hypertension, diabetes mellitus type 2, rectal CA, chronic calcified brain mass likely from meningioma, and a history of seizures. He lost his medical insurance and has not been seen by primary care provider in a long time. He presented to the hospital on 12/12/2016 with left-sided weakness, diplopia and admitted to being off his medications. His blood pressure systolically was greater than 240. An MRI confirmed a right paramedian brainstem stroke. He did not meet the window for TPA. Neurology admitted him. CT angiogram of the neck showed moderate disease. His echo showed a normal heart. His LDL was 154. A1c 6.2%. He was placed on aspirin, Lipitor and  blood pressure medications. He transitioned to inpatient rehabilitation on 12/18/2016 through 01/08/2017.  He still has some left-sided weakness in both upper and lower extremities. Occasionally his speech is a little slurred for the most part he speaks okay. His vision has improved. He is able to transfer with 1-2 person assistance. A home health physical therapist is coming out twice weekly. He has fallen twice since being discharged most recently today where he struck his face. His wife is admitting to the difficulties caring for him with her working and their daughter back to school. She is wondering about placement for continued skilled nursing for right now. He has been compliant with his medications. Nonsmoker.  ROS: GEN: denies fever or chills, denies change in weight Skin: denies lesions or rashes HEENT: + headache rarely, earache, epistaxis, sore throat, or neck pain LUNGS: denies SHOB, dyspnea, PND, orthopnea CV: denies CP or palpitations ABD: denies abd pain, N or V EXT:  denies muscle spasms or swelling; no pain in lower ext, + weakness NEURO: + numbness or tingling, + sz, +stroke no TIA   ALLERGIES: No Known Allergies  PAST MEDICAL HISTORY: Past Medical History:  Diagnosis Date  . Colon cancer ([place])    rectal  . Diabetes mellitus without complication ([place])   . Hypertension   . Seizure ([place])     PAST SURGICAL HISTORY: Past Surgical History:  Procedure Laterality Date  . APPENDECTOMY    . JOINT REPLACEMENT Left 2004   Left hip  . TOTAL HIP ARTHROPLASTY      MEDICATIONS AT HOME: Prior to Admission medications   Medication Sig Start Date End Date Taking? Authorizing Provider  amLODipine (NORVASC) 10 MG tablet Take 1 tablet (10 mg total) by mouth daily. 01/08/17  Yes Angiulli, Lavon Paganini, PA-C  aspirin 325 MG tablet Take 1 tablet (325 mg total) by mouth daily. 12/19/16  Yes Velvet Bathe, MD  atorvastatin (LIPITOR) 80 MG tablet Take 1 tablet (80 mg total) by mouth daily at 6 PM. 01/08/17  Yes Angiulli, Lavon Paganini, PA-C  carvedilol (COREG) 25 MG tablet Take 1 tablet (25 mg total) by mouth 2 (two) times daily with a meal. 01/08/17  Yes Angiulli, Lavon Paganini, PA-C  FLUoxetine (PROZAC) 10 MG capsule Take 1 capsule (10 mg total) by mouth daily. 01/08/17  Yes Angiulli, Lavon Paganini, PA-C  Melatonin 3 MG TABS Take 3 tablets (9 mg total) by mouth at bedtime. 01/08/17  Yes Angiulli, Lavon Paganini, PA-C  oxybutynin (DITROPAN) 5 MG tablet Take 0.5 tablets (2.5 mg total) by mouth 2 (two) times  daily. 01/08/17  Yes Angiulli, Lavon Paganini, PA-C  valsartan (DIOVAN) 160 MG tablet Take 1 tablet (160 mg total) by mouth daily. 01/08/17  Yes Angiulli, Lavon Paganini, PA-C    Family History  Problem Relation Age of Onset  . Hypertension Mother   . Heart disease Mother   . Hyperlipidemia Mother   . Heart failure Mother   . Hypertension Sister   . Hypertension Brother   . Stroke Father   . Hypertension Father   . Colon cancer Maternal Grandmother   . Diabetes Maternal Grandmother      Social-married, children, previously working PT, nonsmoker  Objective:   Vitals:   01/15/17 1026  BP: 125/81  Pulse: 88  Resp: 16  Temp: 98.7 F (37.1 C)  TempSrc: Oral  SpO2: 96%    Exam General appearance : Awake, alert, not in any distress. Speech Clear. Not toxic looking HEENT: Atraumatic and Normocephalic, pupils equally reactive to light and accomodation Neck: supple, no JVD. No cervical lymphadenopathy.  Chest:Good air entry bilaterally, no added sounds  CVS: S1 S2 regular, no murmurs.  Abdomen: Bowel sounds present, Non tender and not distended with no guarding, rigidity or rebound. Extremities: B/L Lower Ext shows no edema, both legs are warm to touch Neurology: Awake alert, and oriented X 3, CN II-XII grossly intact, Non focal; weak on left upper and lower body Skin:No Rash   Data Review Lab Results  Component Value Date   HGBA1C 6.2 (H) 12/13/2016     Assessment & Plan  1. CVA  -cont rsk factor modification  -aggressive BP and chol control  -Cont HHPT  -would benefit from SNF/additional rehab placement  -Neuro follow up Dr. Leonie Man this month  -rehab medicine follow up Dr. Letta Pate 02/03/16 2. HTN  -cont current meds   -low salt diet 3. Hyperlipidemia  -cont statin  Ultimately the wife is very frustrated with his level of care. He is requiring a lot of assistance from her and she is needing to work. The daughter is back in school. She is hopeful that we can assist her with skilled nursing facility/rehabilitation placement. We have exhausted our options and ultimately have recommended that she call Adult Protective Services for assistance with transitioning or perhaps even a repeat presented to the emergency department for assistance. She has discussed with our registered nurse and social worker on staff.   Return in about 2 weeks (around 01/29/2017).  The patient was given clear instructions to go to ER or return to medical center if symptoms don't  improve, worsen or new problems develop. The patient verbalized understanding. The patient was told to call to get lab results if they haven't heard anything in the next week.   Total time spent with patient was 35 min ). Greater than 50 % of this visit was spent face to face counseling and coordinating care regarding risk factor modification, compliance importance and encouragement, education related to stroke and rehabilitation.  This note has been created with Surveyor, quantity. Any transcriptional errors are unintentional.    Zettie Pho, PA-C [facility] and [facility] [place], [place]   01/15/2017, 1:07 PM

## 2017-01-15 NOTE — Progress Notes (Signed)
Pt here for HFU Falls x 2 since being home

## 2017-01-15 NOTE — Telephone Encounter (Signed)
Met with the patient and his wife at the request of Zettie Pho, [place].  His wife was explaining her concerns about having her husband at home as she needs to work. She said she is back to work since 01/12/17. She reported that he has fallen a few times since he has been home, including this morning. She is seeking information about  skilled nursing facility placement.  She explained that she is not sure why he was discharged home from rehab and was not transferred to a SNF short term to help increase his independence.  She said that she added him to her insurance coverage after the open enrollment period and he should have insurance coverage now but she did not have any insurance information specific to him. She did provide this CM with her insurance card to check for SNF benefit. Kellie Moor, TCC care coordinator contacted Christella Scheuermann and was  Informed that the patient is no longer eligible , his insurance has been inactive since 01/13/16. Azell Der informed the patient and his wife of the eligibility status. His wife stated that the hospital had 2 facilities that he could have gone to without insurance but she did not know the names of the facilities. This CM explained that now he is home and it is difficult to place a patient in a facility without any insurance.   This CM left a voice mail message for Randel Books, LCSW to inquire what facilities the patient had been eligible for without insurance. Call back requested.   This CM spoke to [facility], social work navigator/[facility] to discuss the patient's status and wife's request for facility placement at least temporarily.  She stated that [facility] could have a SW see him at home but without insurance facility placement would be impossible. The patient has already received a home PT visit on 01/13/17. Alice stated that the patient/wife could be given her phone # if they have any questions # [phone].   The patient's status and request for facility placement was  discussed with Christa See. LCSW.[place]  Another option for the patient's wife, is to call APS to inquire if they are able to assist with placement if she is not able to care for him.    This CM attempted to meet again with the patient and his wife to provide them with an update on the calls made to R. Dupree, Alice Ward, SW and the discussion with Clearence Ped, LCSW.as well as contact #s for [facility], APS.  The patient was already in the car and his wife was getting in the car and stated that she had to leave to go to work and could not talk. She would need to talk at another time.  Instructed her to call this office at any time and information can be shared with her.   Update provided to Zettie Pho, [place]

## 2017-01-15 NOTE — Patient Instructions (Signed)
Your options as explained are to call Adult Protective Services or go to the Emergency Department to try and get the necessary rehab placement

## 2017-01-16 ENCOUNTER — Telehealth: Payer: Self-pay | Admitting: Licensed Clinical Social Worker

## 2017-01-16 NOTE — Telephone Encounter (Signed)
LCSWA placed calls on pt's home and mobile numbers in an attempt to follow up on request for facility placement.   HIPPA compliant messages were left requesting a return call.

## 2017-01-26 ENCOUNTER — Inpatient Hospital Stay: Payer: Self-pay | Admitting: Physical Medicine & Rehabilitation

## 2017-01-30 ENCOUNTER — Inpatient Hospital Stay: Payer: Self-pay | Admitting: Physical Medicine & Rehabilitation

## 2017-02-02 ENCOUNTER — Inpatient Hospital Stay: Payer: Self-pay | Admitting: Physical Medicine & Rehabilitation

## 2017-02-04 ENCOUNTER — Telehealth: Payer: Self-pay

## 2017-02-04 NOTE — Telephone Encounter (Signed)
Frankie/[facility] [phone] request VO for continued PT 2 x 4.

## 2017-02-04 NOTE — Telephone Encounter (Signed)
Left vm on [name] confidential vm to continue PT 2 times for 4 weeks per Dr.Sethi.

## 2017-02-05 ENCOUNTER — Ambulatory Visit (HOSPITAL_BASED_OUTPATIENT_CLINIC_OR_DEPARTMENT_OTHER): Payer: Managed Care, Other (non HMO) | Admitting: Physical Medicine & Rehabilitation

## 2017-02-05 ENCOUNTER — Encounter: Payer: Managed Care, Other (non HMO) | Attending: Physical Medicine & Rehabilitation

## 2017-02-05 ENCOUNTER — Encounter: Payer: Self-pay | Admitting: Physical Medicine & Rehabilitation

## 2017-02-05 VITALS — BP 143/91 | HR 83 | Resp 14

## 2017-02-05 DIAGNOSIS — Z96642 Presence of left artificial hip joint: Secondary | ICD-10-CM | POA: Insufficient documentation

## 2017-02-05 DIAGNOSIS — Z79899 Other long term (current) drug therapy: Secondary | ICD-10-CM | POA: Diagnosis not present

## 2017-02-05 DIAGNOSIS — Z8249 Family history of ischemic heart disease and other diseases of the circulatory system: Secondary | ICD-10-CM | POA: Diagnosis not present

## 2017-02-05 DIAGNOSIS — I69354 Hemiplegia and hemiparesis following cerebral infarction affecting left non-dominant side: Secondary | ICD-10-CM | POA: Diagnosis not present

## 2017-02-05 DIAGNOSIS — E785 Hyperlipidemia, unspecified: Secondary | ICD-10-CM | POA: Insufficient documentation

## 2017-02-05 DIAGNOSIS — I10 Essential (primary) hypertension: Secondary | ICD-10-CM

## 2017-02-05 DIAGNOSIS — F329 Major depressive disorder, single episode, unspecified: Secondary | ICD-10-CM | POA: Diagnosis not present

## 2017-02-05 DIAGNOSIS — Z823 Family history of stroke: Secondary | ICD-10-CM | POA: Insufficient documentation

## 2017-02-05 DIAGNOSIS — G8194 Hemiplegia, unspecified affecting left nondominant side: Secondary | ICD-10-CM

## 2017-02-05 DIAGNOSIS — R569 Unspecified convulsions: Secondary | ICD-10-CM | POA: Insufficient documentation

## 2017-02-05 DIAGNOSIS — I739 Peripheral vascular disease, unspecified: Secondary | ICD-10-CM | POA: Insufficient documentation

## 2017-02-05 DIAGNOSIS — I6350 Cerebral infarction due to unspecified occlusion or stenosis of unspecified cerebral artery: Secondary | ICD-10-CM | POA: Diagnosis not present

## 2017-02-05 DIAGNOSIS — I69398 Other sequelae of cerebral infarction: Secondary | ICD-10-CM | POA: Insufficient documentation

## 2017-02-05 DIAGNOSIS — N3289 Other specified disorders of bladder: Secondary | ICD-10-CM | POA: Insufficient documentation

## 2017-02-05 DIAGNOSIS — Z7982 Long term (current) use of aspirin: Secondary | ICD-10-CM | POA: Insufficient documentation

## 2017-02-05 DIAGNOSIS — Z85048 Personal history of other malignant neoplasm of rectum, rectosigmoid junction, and anus: Secondary | ICD-10-CM | POA: Diagnosis not present

## 2017-02-05 DIAGNOSIS — E119 Type 2 diabetes mellitus without complications: Secondary | ICD-10-CM | POA: Diagnosis not present

## 2017-02-05 DIAGNOSIS — E1159 Type 2 diabetes mellitus with other circulatory complications: Secondary | ICD-10-CM | POA: Diagnosis not present

## 2017-02-05 MED ORDER — VALSARTAN 160 MG PO TABS: 160.0000 mg | ORAL_TABLET | Freq: Every day | ORAL | 0 refills | Status: DC

## 2017-02-05 MED ORDER — CARVEDILOL 25 MG PO TABS: 25.0000 mg | ORAL_TABLET | Freq: Two times a day (BID) | ORAL | 0 refills | Status: DC

## 2017-02-05 MED ORDER — ATORVASTATIN CALCIUM 80 MG PO TABS: 80.0000 mg | ORAL_TABLET | Freq: Every day | ORAL | 0 refills | Status: DC

## 2017-02-05 MED ORDER — OXYBUTYNIN CHLORIDE 5 MG PO TABS: 2.5000 mg | ORAL_TABLET | Freq: Two times a day (BID) | ORAL | 0 refills | Status: DC

## 2017-02-05 NOTE — Patient Instructions (Addendum)
No driving  Recommend heel lift under the foot plate of the AFO  Please use walker when you are up  Any further refill on meds is from  Zettie Pho, [place] [facility] [place], [place]    I'll see u in 1 month and likely swithch therapy to outpatient

## 2017-02-05 NOTE — Telephone Encounter (Signed)
ERROR

## 2017-02-05 NOTE — Progress Notes (Signed)
Subjective:    Patient ID: [name], male    DOB: 1965/08/12, 52 y.o.   MRN: [id number] 52 year old right-handed male with history of hypertension, diabetes mellitus, and chronic calcified brain mass likely meningioma, lives with spouse independent prior to admission.  Wife works during the day.  Presented on 12/12/2016 with left-sided weakness.  Noted blood pressure 240/140.  The patient did report he ran out of his medications a few months ago with financial constraints.  Cranial CT scan unremarkable.  Stable right medial frontal region calcified mass, vasogenic edema right frontal lobe.  The patient did not receive tPA.  CT angiogram of the head and neck negative for large proximal arterial branch occlusion.  MRI showed acute right paramedian brainstem infarct affecting lower pons and upper medulla. Echocardiogram with ejection fraction of 33%, grade 1 diastolic dysfunction, no emboli.  Maintained on aspirin for CVA prophylaxis. Subcutaneous Lovenox for DVT prophylaxis.  Diet slowly advanced. DATE OF ADMISSION:  12/18/2016 DATE OF DISCHARGE:  01/08/2017  HPI  Has not seen PCP PT, OT, home health.  BP checks at home are good 3 falls at home no significant injury except a nose bleeds Pt was in kitchen getting chips and missed chair standing up.   Dressing modI Bathing transfer assist from wife No left shoulder pain  Some tremor in neck for ~1 wk comes and goes, no shaking in arm or leg. Episode last about 2 min, resolves on it's own. Head moves side to side Not painful  Uses walker to ambulate Cane used in bedroom only, no falls with this  Pain Inventory Average Pain 3 Pain Right Now 0 My pain is intermittent and dull  In the last 24 hours, has pain interfered with the following? General activity 10 Relation with others 0 Enjoyment of life 0 What TIME of day is your pain at its worst? no pain Sleep (in general) Good  Pain is worse with: unsure Pain  improves with: no pain Relief from Meds: no pain  Mobility walk with assistance use a walker do you drive?  no use a wheelchair transfers alone Do you have any goals in this area?  yes  Function Do you have any goals in this area?  yes  Neuro/Psych trouble walking  Prior Studies hospital f/u  Physicians involved in your care hospital f/u   Family History  Problem Relation Age of Onset  . Hypertension Mother   . Heart disease Mother   . Hyperlipidemia Mother   . Heart failure Mother   . Hypertension Sister   . Hypertension Brother   . Stroke Father   . Hypertension Father   . Colon cancer Maternal Grandmother   . Diabetes Maternal Grandmother    Social History   Socioeconomic History  . Marital status: Married    Spouse name: [name]  . Number of children: 1  . Years of education: 59  . Highest education level: None  Social Needs  . Financial resource strain: None  . Food insecurity - worry: None  . Food insecurity - inability: None  . Transportation needs - medical: None  . Transportation needs - non-medical: None  Occupational History  . Occupation: Group Home  Tobacco Use  . Smoking status: Never Smoker  . Smokeless tobacco: Never Used  Substance and Sexual Activity  . Alcohol use: No    Alcohol/week: 0.0 oz    Frequency: Never    Comment: socially  . Drug use: No  . Sexual activity: None  Other Topics Concern  . None  Social History Narrative   Patient drinks caffeine a couple times a week.   Patient is right handed.   Lives with wife [name]) and daughter   Past Surgical History:  Procedure Laterality Date  . APPENDECTOMY    . JOINT REPLACEMENT Left 2004   Left hip  . TOTAL HIP ARTHROPLASTY     Past Medical History:  Diagnosis Date  . Colon cancer ([place])    rectal  . Diabetes mellitus without complication ([place])   . Hypertension   . Seizure ([place])    BP (!) 143/91 (BP Location: Right Arm, Patient Position: Sitting, Cuff Size:  Large)   Pulse 83   Resp 14   SpO2 (!) 84%   Opioid Risk Score:   Fall Risk Score:  `1  Depression screen PHQ 2/9  No flowsheet data found.  Review of Systems  Constitutional: Negative.   HENT: Negative.   Eyes: Negative.   Respiratory: Negative.   Cardiovascular: Negative.   Gastrointestinal: Negative.   Endocrine: Negative.   Genitourinary: Negative.   Musculoskeletal: Positive for gait problem.  Skin: Negative.   Allergic/Immunologic: Negative.   Hematological: Negative.   Psychiatric/Behavioral: Negative.   All other systems reviewed and are negative.      Objective:   Physical Exam  Constitutional: He is oriented to person, place, and time. He appears well-developed and well-nourished. No distress.  HENT:  Head: Normocephalic and atraumatic.  Eyes: Conjunctivae and EOM are normal. Pupils are equal, round, and reactive to light.  Neck: Normal range of motion.  Cardiovascular: Normal rate, regular rhythm and normal heart sounds. Exam reveals no friction rub.  No murmur heard. Pulmonary/Chest: Effort normal and breath sounds normal. No stridor. No respiratory distress. He has no wheezes.  Abdominal: Soft. Bowel sounds are normal. He exhibits no distension. There is no tenderness.  Neurological: He is alert and oriented to person, place, and time.  Skin: He is not diaphoretic.  Psychiatric: He has a normal mood and affect.  Nursing note and vitals reviewed.  Motor strength is 5/5 in the right deltoid, bicep, tricep, grip, hip flexor, knee extensor, ankle dose flexor 3- at the left deltoid, bicep, tricep, grip, hip flexor Knee extensor, 0 at the ankle dorsiflexor plantar flexor  Wears a right AFO       Assessment & Plan:  1.   Left spastic hemiparesis Secondary to right pontomedullary infarct, small vessel disease secondary to uncontrolled hypertension  Continue aspirin 325 mg/day for CVA prophylaxis  Continue home health PT OT.  Patient at a intermittent  supervision level. Physical medicine and rehabilitation follow-up in 1 month, should be ready for transition to outpatient therapy at that time.  Insurance may be an issue however.  2.  Uncontrolled hypertension continue Coreg 25 mg twice daily, valsartan and 160 mg daily Amlodipine 10 mg daily  Apparently his [employer] has lapsed and he will switch his primary care physician from Gertie Exon to Zettie Pho, [place] [facility] [place], [place]    3.  Hyperlipidemia continue Lipitor 80 mg daily  4.  Spastic bladder secondary to CVA will continue Ditropan 2.5 mg twice daily  #5.  Post stroke depression continue fluoxetine 10 mg daily  Patient will get further refills from primary care physician Zettie Pho, PA-C

## 2017-02-06 ENCOUNTER — Telehealth: Payer: Self-pay | Admitting: Neurology

## 2017-02-06 ENCOUNTER — Telehealth: Payer: Self-pay | Admitting: Internal Medicine

## 2017-02-06 MED ORDER — FLUOXETINE HCL 10 MG PO CAPS: 10.0000 mg | ORAL_CAPSULE | Freq: Every day | ORAL | 0 refills | Status: DC

## 2017-02-06 MED ORDER — AMLODIPINE BESYLATE 10 MG PO TABS: 10.0000 mg | ORAL_TABLET | Freq: Every day | ORAL | 0 refills | Status: DC

## 2017-02-06 NOTE — Telephone Encounter (Signed)
Patient called to request for a medication refill, please fu FLUoxetine (PROZAC) 10 MG capsule [[id number]]  amLODipine (NORVASC) 10 MG tablet [[id number]]

## 2017-02-06 NOTE — Telephone Encounter (Signed)
Refilled x 30 days, needs OV to establish with PCP

## 2017-02-06 NOTE — Telephone Encounter (Signed)
Katherine/[facility] [phone] called request VO for OT 2 x 2, then 1 x 1. Please call to advise

## 2017-02-06 NOTE — Telephone Encounter (Signed)
I called and gave verbal orders for occupational therapy. 

## 2017-03-05 ENCOUNTER — Ambulatory Visit: Payer: Managed Care, Other (non HMO) | Admitting: Physical Medicine & Rehabilitation

## 2017-03-05 ENCOUNTER — Ambulatory Visit: Payer: Managed Care, Other (non HMO)

## 2017-03-09 ENCOUNTER — Telehealth: Payer: Self-pay

## 2017-03-09 ENCOUNTER — Other Ambulatory Visit: Payer: Self-pay

## 2017-03-09 MED ORDER — VALSARTAN 160 MG PO TABS: 160.0000 mg | ORAL_TABLET | Freq: Every day | ORAL | 0 refills | Status: DC

## 2017-03-09 MED ORDER — ATORVASTATIN CALCIUM 80 MG PO TABS: 80.0000 mg | ORAL_TABLET | Freq: Every day | ORAL | 0 refills | Status: DC

## 2017-03-09 MED ORDER — CARVEDILOL 25 MG PO TABS: 25.0000 mg | ORAL_TABLET | Freq: Two times a day (BID) | ORAL | 0 refills | Status: DC

## 2017-03-10 ENCOUNTER — Ambulatory Visit: Payer: Managed Care, Other (non HMO) | Admitting: Physical Medicine & Rehabilitation

## 2017-03-10 ENCOUNTER — Encounter: Payer: Managed Care, Other (non HMO) | Attending: Physical Medicine & Rehabilitation

## 2017-03-10 ENCOUNTER — Other Ambulatory Visit: Payer: Self-pay | Admitting: *Deleted

## 2017-03-10 DIAGNOSIS — Z96642 Presence of left artificial hip joint: Secondary | ICD-10-CM | POA: Insufficient documentation

## 2017-03-10 DIAGNOSIS — I69354 Hemiplegia and hemiparesis following cerebral infarction affecting left non-dominant side: Secondary | ICD-10-CM | POA: Insufficient documentation

## 2017-03-10 DIAGNOSIS — R569 Unspecified convulsions: Secondary | ICD-10-CM | POA: Insufficient documentation

## 2017-03-10 DIAGNOSIS — Z7982 Long term (current) use of aspirin: Secondary | ICD-10-CM | POA: Insufficient documentation

## 2017-03-10 DIAGNOSIS — I69398 Other sequelae of cerebral infarction: Secondary | ICD-10-CM | POA: Insufficient documentation

## 2017-03-10 DIAGNOSIS — N3289 Other specified disorders of bladder: Secondary | ICD-10-CM | POA: Insufficient documentation

## 2017-03-10 DIAGNOSIS — Z8249 Family history of ischemic heart disease and other diseases of the circulatory system: Secondary | ICD-10-CM | POA: Insufficient documentation

## 2017-03-10 DIAGNOSIS — I739 Peripheral vascular disease, unspecified: Secondary | ICD-10-CM | POA: Insufficient documentation

## 2017-03-10 DIAGNOSIS — F329 Major depressive disorder, single episode, unspecified: Secondary | ICD-10-CM | POA: Insufficient documentation

## 2017-03-10 DIAGNOSIS — Z79899 Other long term (current) drug therapy: Secondary | ICD-10-CM | POA: Insufficient documentation

## 2017-03-10 DIAGNOSIS — Z823 Family history of stroke: Secondary | ICD-10-CM | POA: Insufficient documentation

## 2017-03-10 DIAGNOSIS — E119 Type 2 diabetes mellitus without complications: Secondary | ICD-10-CM | POA: Insufficient documentation

## 2017-03-10 DIAGNOSIS — E785 Hyperlipidemia, unspecified: Secondary | ICD-10-CM | POA: Insufficient documentation

## 2017-03-10 DIAGNOSIS — I10 Essential (primary) hypertension: Secondary | ICD-10-CM | POA: Insufficient documentation

## 2017-03-10 DIAGNOSIS — Z85048 Personal history of other malignant neoplasm of rectum, rectosigmoid junction, and anus: Secondary | ICD-10-CM | POA: Insufficient documentation

## 2017-03-10 MED ORDER — AMLODIPINE BESYLATE 10 MG PO TABS: 10.0000 mg | ORAL_TABLET | Freq: Every day | ORAL | 0 refills | Status: DC

## 2017-03-10 MED ORDER — ATORVASTATIN CALCIUM 80 MG PO TABS: 80.0000 mg | ORAL_TABLET | Freq: Every day | ORAL | 0 refills | Status: DC

## 2017-03-10 MED ORDER — CARVEDILOL 25 MG PO TABS: 25.0000 mg | ORAL_TABLET | Freq: Two times a day (BID) | ORAL | 0 refills | Status: DC

## 2017-03-10 MED ORDER — FLUOXETINE HCL 10 MG PO CAPS: 10.0000 mg | ORAL_CAPSULE | Freq: Every day | ORAL | 0 refills | Status: DC

## 2017-03-10 MED ORDER — VALSARTAN 160 MG PO TABS: 160.0000 mg | ORAL_TABLET | Freq: Every day | ORAL | 0 refills | Status: DC

## 2017-03-30 ENCOUNTER — Encounter: Payer: Managed Care, Other (non HMO) | Attending: Physical Medicine & Rehabilitation

## 2017-03-30 ENCOUNTER — Encounter: Payer: Self-pay | Admitting: Physical Medicine & Rehabilitation

## 2017-03-30 ENCOUNTER — Ambulatory Visit (HOSPITAL_BASED_OUTPATIENT_CLINIC_OR_DEPARTMENT_OTHER): Payer: Medicaid Other | Admitting: Physical Medicine & Rehabilitation

## 2017-03-30 VITALS — BP 124/76 | HR 75 | Resp 14

## 2017-03-30 DIAGNOSIS — Z823 Family history of stroke: Secondary | ICD-10-CM | POA: Insufficient documentation

## 2017-03-30 DIAGNOSIS — I739 Peripheral vascular disease, unspecified: Secondary | ICD-10-CM | POA: Insufficient documentation

## 2017-03-30 DIAGNOSIS — I69398 Other sequelae of cerebral infarction: Secondary | ICD-10-CM | POA: Insufficient documentation

## 2017-03-30 DIAGNOSIS — Z79899 Other long term (current) drug therapy: Secondary | ICD-10-CM | POA: Diagnosis not present

## 2017-03-30 DIAGNOSIS — I10 Essential (primary) hypertension: Secondary | ICD-10-CM | POA: Insufficient documentation

## 2017-03-30 DIAGNOSIS — E119 Type 2 diabetes mellitus without complications: Secondary | ICD-10-CM | POA: Insufficient documentation

## 2017-03-30 DIAGNOSIS — Z96642 Presence of left artificial hip joint: Secondary | ICD-10-CM | POA: Diagnosis not present

## 2017-03-30 DIAGNOSIS — F329 Major depressive disorder, single episode, unspecified: Secondary | ICD-10-CM | POA: Diagnosis not present

## 2017-03-30 DIAGNOSIS — I6350 Cerebral infarction due to unspecified occlusion or stenosis of unspecified cerebral artery: Secondary | ICD-10-CM | POA: Diagnosis not present

## 2017-03-30 DIAGNOSIS — E785 Hyperlipidemia, unspecified: Secondary | ICD-10-CM | POA: Insufficient documentation

## 2017-03-30 DIAGNOSIS — Z7982 Long term (current) use of aspirin: Secondary | ICD-10-CM | POA: Insufficient documentation

## 2017-03-30 DIAGNOSIS — Z85048 Personal history of other malignant neoplasm of rectum, rectosigmoid junction, and anus: Secondary | ICD-10-CM | POA: Diagnosis not present

## 2017-03-30 DIAGNOSIS — G8194 Hemiplegia, unspecified affecting left nondominant side: Secondary | ICD-10-CM

## 2017-03-30 DIAGNOSIS — R569 Unspecified convulsions: Secondary | ICD-10-CM | POA: Insufficient documentation

## 2017-03-30 DIAGNOSIS — I69354 Hemiplegia and hemiparesis following cerebral infarction affecting left non-dominant side: Secondary | ICD-10-CM | POA: Diagnosis present

## 2017-03-30 DIAGNOSIS — N3289 Other specified disorders of bladder: Secondary | ICD-10-CM | POA: Insufficient documentation

## 2017-03-30 DIAGNOSIS — Z8249 Family history of ischemic heart disease and other diseases of the circulatory system: Secondary | ICD-10-CM | POA: Insufficient documentation

## 2017-03-30 NOTE — Patient Instructions (Signed)
Practice going up and down driveway with walker and WC  Call me once you can get in and out of the house independantly and I'll order Outpt PT, OT,  You will ned to apply for SCAT bus as well

## 2017-03-30 NOTE — Progress Notes (Signed)
Subjective:    Patient ID: [name], male    DOB: 1965-03-16, 52 y.o.   MRN: [id number] 52 year old right-handed male with history of hypertension, diabetes mellitus, and chronic calcified brain mass likely meningioma, lives with spouse independent prior to admission.  Wife works during the day.  Presented on 12/12/2016 with left-sided weakness.  Noted blood pressure 240/140.  The patient did report he ran out of his medications a few months ago with financial constraints.  Cranial CT scan unremarkable.  Stable right medial frontal region calcified mass, vasogenic edema right frontal lobe.  The patient did not receive tPA.  CT angiogram of the head and neck negative for large proximal arterial branch occlusion.  MRI showed acute right paramedian brainstem infarct affecting lower pons and upper medulla. Echocardiogram with ejection fraction of 40%, grade 1 diastolic dysfunction, no emboli.  Maintained on aspirin HPI Left shoulder pain with dressing or overhead movements  HH stopped ~3 wks ago, no transportation to OP therapy No ramp so he cannot use SCAT  Sees Dr Wynetta Emery as PCP at [facility]  Amb with walker Pain Inventory Average Pain 0 Pain Right Now 0 My pain is dull  In the last 24 hours, has pain interfered with the following? General activity 0 Relation with others 0 Enjoyment of life 0 What TIME of day is your pain at its worst? varies with activity Sleep (in general) Good  Pain is worse with: some activites Pain improves with: rest Relief from Meds: 10  Mobility walk with assistance use a walker how many minutes can you walk? 5 ability to climb steps?  no do you drive?  no use a wheelchair transfers alone Do you have any goals in this area?  yes  Function not employed: date last employed . I need assistance with the following:  bathing Do you have any goals in this area?  yes  Neuro/Psych weakness trouble  walking dizziness confusion  Prior Studies Any changes since last visit?  no  Physicians involved in your care Any changes since last visit?  no   Family History  Problem Relation Age of Onset  . Hypertension Mother   . Heart disease Mother   . Hyperlipidemia Mother   . Heart failure Mother   . Hypertension Sister   . Hypertension Brother   . Stroke Father   . Hypertension Father   . Colon cancer Maternal Grandmother   . Diabetes Maternal Grandmother    Social History   Socioeconomic History  . Marital status: Married    Spouse name: [name]  . Number of children: 1  . Years of education: 9  . Highest education level: None  Social Needs  . Financial resource strain: None  . Food insecurity - worry: None  . Food insecurity - inability: None  . Transportation needs - medical: None  . Transportation needs - non-medical: None  Occupational History  . Occupation: Group Home  Tobacco Use  . Smoking status: Never Smoker  . Smokeless tobacco: Never Used  Substance and Sexual Activity  . Alcohol use: No    Alcohol/week: 0.0 oz    Frequency: Never    Comment: socially  . Drug use: No  . Sexual activity: None  Other Topics Concern  . None  Social History Narrative   Patient drinks caffeine a couple times a week.   Patient is right handed.   Lives with wife [name]) and daughter   Past Surgical History:  Procedure Laterality  Date  . APPENDECTOMY    . JOINT REPLACEMENT Left 2004   Left hip  . TOTAL HIP ARTHROPLASTY     Past Medical History:  Diagnosis Date  . Colon cancer ([place])    rectal  . Diabetes mellitus without complication ([place])   . Hypertension   . Seizure ([place])    BP 124/76 (BP Location: Right Arm, Patient Position: Sitting, Cuff Size: Normal)   Pulse 75   Resp 14   SpO2 96%   Opioid Risk Score:   Fall Risk Score:  `1  Depression screen PHQ 2/9  Depression screen PHQ 2/9 02/05/2017  Decreased Interest 0  Down, Depressed, Hopeless 0   PHQ - 2 Score 0    Review of Systems  HENT: Negative.   Eyes: Negative.   Respiratory: Negative.   Cardiovascular: Positive for leg swelling.  Gastrointestinal: Negative.   Endocrine:       High blood sugar  Genitourinary: Negative.   Musculoskeletal: Positive for gait problem.  Skin: Negative.   Allergic/Immunologic: Negative.   Neurological: Positive for dizziness and weakness.  Hematological: Negative.   Psychiatric/Behavioral: Positive for confusion.  All other systems reviewed and are negative.      Objective:   Physical Exam  3- Left delt, bi tri      Assessment & Plan:  1.   Left spastic hemiparesis Secondary to right pontomedullary infarct, small vessel disease secondary to HTN  Continue aspirin 325 mg/day for CVA prophylaxis  Patient Mod I in home but still needs help getting up and down driveway Have instructed patient to practice going up and down the driveway with both his walker and  wheelchair under supervision initially Medically ready for OP but wife needs to have ramp built  2.  controlled hypertension continue Coreg 25 mg twice daily, valsartan and 160 mg daily Amlodipine 10 mg daily F/u with Dr Wynetta Emery PCP

## 2017-03-31 ENCOUNTER — Encounter: Payer: Self-pay | Admitting: Internal Medicine

## 2017-03-31 ENCOUNTER — Ambulatory Visit: Payer: Managed Care, Other (non HMO) | Attending: Internal Medicine | Admitting: Internal Medicine

## 2017-03-31 VITALS — BP 117/76 | HR 73 | Temp 98.5°F | Resp 16 | Wt 193.4 lb

## 2017-03-31 DIAGNOSIS — C20 Malignant neoplasm of rectum: Secondary | ICD-10-CM

## 2017-03-31 DIAGNOSIS — Z9221 Personal history of antineoplastic chemotherapy: Secondary | ICD-10-CM | POA: Insufficient documentation

## 2017-03-31 DIAGNOSIS — I69322 Dysarthria following cerebral infarction: Secondary | ICD-10-CM

## 2017-03-31 DIAGNOSIS — Z86011 Personal history of benign neoplasm of the brain: Secondary | ICD-10-CM

## 2017-03-31 DIAGNOSIS — Z923 Personal history of irradiation: Secondary | ICD-10-CM | POA: Insufficient documentation

## 2017-03-31 DIAGNOSIS — E1169 Type 2 diabetes mellitus with other specified complication: Secondary | ICD-10-CM | POA: Diagnosis not present

## 2017-03-31 DIAGNOSIS — I69822 Dysarthria following other cerebrovascular disease: Secondary | ICD-10-CM | POA: Diagnosis not present

## 2017-03-31 DIAGNOSIS — Z85048 Personal history of other malignant neoplasm of rectum, rectosigmoid junction, and anus: Secondary | ICD-10-CM | POA: Diagnosis not present

## 2017-03-31 DIAGNOSIS — I10 Essential (primary) hypertension: Secondary | ICD-10-CM | POA: Insufficient documentation

## 2017-03-31 DIAGNOSIS — Z7982 Long term (current) use of aspirin: Secondary | ICD-10-CM | POA: Insufficient documentation

## 2017-03-31 DIAGNOSIS — E119 Type 2 diabetes mellitus without complications: Secondary | ICD-10-CM

## 2017-03-31 DIAGNOSIS — I69359 Hemiplegia and hemiparesis following cerebral infarction affecting unspecified side: Secondary | ICD-10-CM

## 2017-03-31 DIAGNOSIS — I69354 Hemiplegia and hemiparesis following cerebral infarction affecting left non-dominant side: Secondary | ICD-10-CM | POA: Diagnosis not present

## 2017-03-31 DIAGNOSIS — Z79899 Other long term (current) drug therapy: Secondary | ICD-10-CM | POA: Insufficient documentation

## 2017-03-31 LAB — GLUCOSE, POCT (MANUAL RESULT ENTRY): POC GLUCOSE: 98 mg/dL (ref 70–99)

## 2017-03-31 NOTE — Progress Notes (Signed)
Pt wife states she feels his slurred of speech has gotten worse  Pt states his left arm has been hurting him

## 2017-03-31 NOTE — Progress Notes (Signed)
Patient ID: [name], male    DOB: 18-Jan-1965  MRN: [id number]  CC: re-establish   Subjective: [name] is a 52 y.o. male who presents to become est with me as PCP.  Wife,  [name], is with him.  Previous PCP was Dr. Bryon Lions. His concerns today include:  Pt with hx of HTN, DM, rectal CA, chronic Ca+ brain mass, sz, brain stem CVA 11/2016 with residual LT spastic hemiparesis and dysarthria  1.  CVA: On last visit with our PA wife was requesting that patient be placed in a skilled nursing facility for more PT as he still was requiring significant help with ADLs at home.  Since then he was getting home P.T 2 x a wk for 6-8 wks and OT 2 x a wk for 3 wks through [facility].  Completed about 3 wks ago.  Saw Dr. Letta Pate yesterday who wanted him to have out-pt P.T. However, no ramp to either door to allow for exit and wife works during the day.  Discussion was had with our case manager.  Plan is to get him a home health aide and SCAT for transportation to and from P.T. Patient has a walker and reports he can walk about 90 feet before having to stop.  Spends 90%of his time  in wheelchair. Can feed self but needs assistance with meal preps.  Wife assist with showers.  He has a shower bench. Washes self.  Uses bedside commode. Transfers from wheelchair to bed by himself  Hx of Rectal CA:  Dx in 2009 and treated with XRT and chemo. Last c-scope 2009 [facility]).  Patient does not think he has had follow-up colonoscopy since then.  Brain mass - followed [facility] Cyndy Freeze at [facility].  Last seen 2017. Not on any sz med    DM:  Does not check BS but does have meter.  Has been diet control. Patient Active Problem List   Diagnosis Date Noted  . Labile blood pressure   . Labile blood glucose   . Acute blood loss anemia   . Benign essential HTN   . Neurogenic bladder due to old stroke 12/25/2016  . Right pontine cerebrovascular  accident ([place]) 12/18/2016  . Left hemiparesis ([place])   . Diastolic dysfunction   . Dysphagia, post-stroke   . AKI (acute kidney injury) ([place])   . Prediabetes   . Acute ischemic stroke ([place]) 12/12/2016  . Acute respiratory failure with hypercapnia ([place])   . Seizures ([place]) 07/22/2014  . Hypertension 07/22/2014  . Hyperglycemia 07/22/2014  . Type 2 diabetes mellitus with vascular disease ([place]) 07/22/2014  . Pyrexia   . Brain mass   . Malignant neoplasm of rectum ([place]) 09/02/2007     Current Outpatient Medications on File Prior to Visit  Medication Sig Dispense Refill  . amLODipine (NORVASC) 10 MG tablet Take 1 tablet (10 mg total) by mouth daily. 30 tablet 0  . aspirin 325 MG tablet Take 1 tablet (325 mg total) by mouth daily. 30 tablet 0  . atorvastatin (LIPITOR) 80 MG tablet Take 1 tablet (80 mg total) by mouth daily at 6 PM. 30 tablet 0  . carvedilol (COREG) 25 MG tablet Take 1 tablet (25 mg total) by mouth 2 (two) times daily with a meal. 60 tablet 0  . FLUoxetine (PROZAC) 10 MG capsule Take 1 capsule (10 mg total) by mouth daily. 30 capsule 0  . Melatonin 3 MG TABS Take  3 tablets (9 mg total) by mouth at bedtime. 30 tablet 0  . oxybutynin (DITROPAN) 5 MG tablet Take 0.5 tablets (2.5 mg total) by mouth 2 (two) times daily. 60 tablet 0  . valsartan (DIOVAN) 160 MG tablet Take 1 tablet (160 mg total) by mouth daily. 30 tablet 0   No current facility-administered medications on file prior to visit.     No Known Allergies  Social History   Socioeconomic History  . Marital status: Married    Spouse name: [name]  . Number of children: 1  . Years of education: 63  . Highest education level: Not on file  Social Needs  . Financial resource strain: Not on file  . Food insecurity - worry: Not on file  . Food insecurity - inability: Not on file  . Transportation needs - medical: Not on file  . Transportation needs - non-medical: Not on file  Occupational History  . Occupation:  Group Home  Tobacco Use  . Smoking status: Never Smoker  . Smokeless tobacco: Never Used  Substance and Sexual Activity  . Alcohol use: No    Alcohol/week: 0.0 oz    Frequency: Never    Comment: socially  . Drug use: No  . Sexual activity: Not on file  Other Topics Concern  . Not on file  Social History Narrative   Patient drinks caffeine a couple times a week.   Patient is right handed.   Lives with wife [name]) and daughter    Family History  Problem Relation Age of Onset  . Hypertension Mother   . Heart disease Mother   . Hyperlipidemia Mother   . Heart failure Mother   . Hypertension Sister   . Hypertension Brother   . Stroke Father   . Hypertension Father   . Colon cancer Maternal Grandmother   . Diabetes Maternal Grandmother     Past Surgical History:  Procedure Laterality Date  . APPENDECTOMY    . JOINT REPLACEMENT Left 2004   Left hip  . TOTAL HIP ARTHROPLASTY      ROS: Review of Systems Neg except as above  PHYSICAL EXAM: BP 117/76   Pulse 73   Temp 98.5 F (36.9 C) (Oral)   Resp 16   Wt 193 lb 6.4 oz (87.7 kg)   SpO2 98%   BMI 34.26 kg/m   Physical Exam  General appearance - alert, well appearing, middle-aged African-American male in wheelchair and in no distress Mental status -oriented to person and place.  He has some problems recalling details of past medical history Mouth - mucous membranes moist, pharynx normal without lesions Neck - supple, no significant adenopathy Chest - clear to auscultation, no wheezes, rales or rhonchi, symmetric air entry Heart - normal rate, regular rhythm, normal S1, S2, no murmurs, rubs, clicks or gallops Neurological -left-sided hemiparesis.  Speech is noted to be slightly dysarthric. Extremities -no lower extremity edema.  Lab Results  Component Value Date   HGBA1C 6.2 (H) 12/13/2016  BS 98  ASSESSMENT AND PLAN:  1. CVA, old, hemiparesis ([place]) 2. Dysarthria as late effect of stroke -Patient's  spouse will complete form for SCAT.  Referral submitted for home health services.  According to the care manager if he gets a home health aide the aid would be able to accompany him to outpatient physical therapy.  Referral submitted for therapy.  3. Essential hypertension At goal.  Continue current medications  4. Rectal cancer [facility]) Once he is completed all physical therapy,  I recommend referring him back to gastroenterology for follow-up since he and spouse are indicating that her last colonoscopy was in 2009  5. History of meningioma of the brain  6. Diabetes mellitus type 2, diet-controlled ([place]) Well controlled on diet alone. - POCT glucose (manual entry)  Patient was given the opportunity to ask questions.  Patient verbalized understanding of the plan and was able to repeat key elements of the plan.   No orders of the defined types were placed in this encounter.    Requested Prescriptions    No prescriptions requested or ordered in this encounter    No Follow-up on file.  Karle Plumber, MD, FACP

## 2017-04-01 DIAGNOSIS — I69322 Dysarthria following cerebral infarction: Secondary | ICD-10-CM | POA: Insufficient documentation

## 2017-04-02 ENCOUNTER — Encounter: Payer: Self-pay | Admitting: Internal Medicine

## 2017-04-06 ENCOUNTER — Telehealth: Payer: Self-pay

## 2017-04-06 NOTE — Telephone Encounter (Signed)
Patient called today requesting a letter for Social security administration to be placed onto social security.

## 2017-04-06 NOTE — Telephone Encounter (Signed)
Completed PCS form faxed to [facility] 

## 2017-04-06 NOTE — Telephone Encounter (Signed)
Not sure what this means.  I gave the wife a letter which is more than what we usually need to do.

## 2017-04-07 ENCOUNTER — Other Ambulatory Visit: Payer: Self-pay | Admitting: Internal Medicine

## 2017-04-07 ENCOUNTER — Telehealth: Payer: Self-pay | Admitting: Internal Medicine

## 2017-04-07 NOTE — Telephone Encounter (Signed)
Call placed to [facility] [phone], regarding patient's PCS form. Spoke with Jorbick and he informed me that application was received. He stated that patient can call them to schedule assessment (if they want to expedite process) but schedulers will contact patient.

## 2017-04-10 ENCOUNTER — Telehealth: Payer: Self-pay | Admitting: *Deleted

## 2017-04-10 MED ORDER — OXYBUTYNIN CHLORIDE 5 MG PO TABS: 2.5000 mg | ORAL_TABLET | Freq: Two times a day (BID) | ORAL | 2 refills | Status: DC

## 2017-04-10 NOTE — Telephone Encounter (Signed)
Rommel called for refills on all of his medications.  I have refilled ditropan per Dr Letta Pate but other medications are to be refilled by his PCP Aundra Dubin. I have notified Lenis.

## 2017-04-13 ENCOUNTER — Ambulatory Visit: Payer: Managed Care, Other (non HMO) | Attending: Internal Medicine | Admitting: Rehabilitation

## 2017-05-10 ENCOUNTER — Other Ambulatory Visit: Payer: Self-pay | Admitting: Physical Medicine & Rehabilitation

## 2017-05-11 ENCOUNTER — Encounter: Payer: Self-pay | Admitting: Physical Medicine & Rehabilitation

## 2017-05-11 ENCOUNTER — Ambulatory Visit (HOSPITAL_BASED_OUTPATIENT_CLINIC_OR_DEPARTMENT_OTHER): Payer: Managed Care, Other (non HMO) | Admitting: Physical Medicine & Rehabilitation

## 2017-05-11 ENCOUNTER — Encounter: Payer: Managed Care, Other (non HMO) | Attending: Physical Medicine & Rehabilitation

## 2017-05-11 ENCOUNTER — Other Ambulatory Visit: Payer: Self-pay

## 2017-05-11 VITALS — BP 152/96 | HR 75

## 2017-05-11 DIAGNOSIS — R569 Unspecified convulsions: Secondary | ICD-10-CM | POA: Insufficient documentation

## 2017-05-11 DIAGNOSIS — Z7982 Long term (current) use of aspirin: Secondary | ICD-10-CM | POA: Insufficient documentation

## 2017-05-11 DIAGNOSIS — Z8249 Family history of ischemic heart disease and other diseases of the circulatory system: Secondary | ICD-10-CM | POA: Insufficient documentation

## 2017-05-11 DIAGNOSIS — Z85048 Personal history of other malignant neoplasm of rectum, rectosigmoid junction, and anus: Secondary | ICD-10-CM | POA: Diagnosis not present

## 2017-05-11 DIAGNOSIS — I10 Essential (primary) hypertension: Secondary | ICD-10-CM | POA: Diagnosis not present

## 2017-05-11 DIAGNOSIS — N3289 Other specified disorders of bladder: Secondary | ICD-10-CM | POA: Insufficient documentation

## 2017-05-11 DIAGNOSIS — Z96642 Presence of left artificial hip joint: Secondary | ICD-10-CM | POA: Insufficient documentation

## 2017-05-11 DIAGNOSIS — Z823 Family history of stroke: Secondary | ICD-10-CM | POA: Insufficient documentation

## 2017-05-11 DIAGNOSIS — Z79899 Other long term (current) drug therapy: Secondary | ICD-10-CM | POA: Insufficient documentation

## 2017-05-11 DIAGNOSIS — I739 Peripheral vascular disease, unspecified: Secondary | ICD-10-CM | POA: Diagnosis not present

## 2017-05-11 DIAGNOSIS — I6350 Cerebral infarction due to unspecified occlusion or stenosis of unspecified cerebral artery: Secondary | ICD-10-CM

## 2017-05-11 DIAGNOSIS — E119 Type 2 diabetes mellitus without complications: Secondary | ICD-10-CM | POA: Insufficient documentation

## 2017-05-11 DIAGNOSIS — I69354 Hemiplegia and hemiparesis following cerebral infarction affecting left non-dominant side: Secondary | ICD-10-CM | POA: Insufficient documentation

## 2017-05-11 DIAGNOSIS — F329 Major depressive disorder, single episode, unspecified: Secondary | ICD-10-CM | POA: Diagnosis not present

## 2017-05-11 DIAGNOSIS — M7502 Adhesive capsulitis of left shoulder: Secondary | ICD-10-CM | POA: Diagnosis not present

## 2017-05-11 DIAGNOSIS — I69398 Other sequelae of cerebral infarction: Secondary | ICD-10-CM | POA: Diagnosis not present

## 2017-05-11 DIAGNOSIS — E785 Hyperlipidemia, unspecified: Secondary | ICD-10-CM | POA: Insufficient documentation

## 2017-05-11 NOTE — Progress Notes (Signed)
Subjective:    Patient ID: [name], male    DOB: 02/23/1965, 52 y.o.   MRN: [id number] 52 year old right-handed male with history of hypertension, diabetes mellitus, and chronic calcified brain mass likely meningioma, lives with spouse independent prior to admission.  Wife works during the day.  Presented on 12/12/2016 with left-sided weakness.  Noted blood pressure 240/140.  The patient did report he ran out of his medications a few months ago with financial constraints.  Cranial CT scan unremarkable.  Stable right medial frontal region calcified mass, vasogenic edema right frontal lobe.  The patient did not receive tPA.  CT angiogram of the head and neck negative for large proximal arterial branch occlusion.  MRI showed acute right paramedian brainstem infarct affecting lower pons and upper medulla. HPI Left shoulder pain worsening, no falls or trauma, pt is having pain with activity not at night No tingling or neck pain  Using walker to amb Not in therapy due to lack of ramp Doing HEP  Pain Inventory Average Pain 0 Pain Right Now 0 My pain is n/a  In the last 24 hours, has pain interfered with the following? General activity 0 Relation with others 0 Enjoyment of life 0 What TIME of day is your pain at its worst? none Sleep (in general) none  Pain is worse with: n/a Pain improves with: n/a Relief from Meds: n/a  Mobility use a walker how many minutes can you walk? 12 ability to climb steps?  no do you drive?  no use a wheelchair transfers alone  Function I need assistance with the following:  feeding, dressing, bathing, meal prep and household duties  Neuro/Psych trouble walking  Prior Studies Any changes since last visit?  no  Physicians involved in your care Any changes since last visit?  no   Family History  Problem Relation Age of Onset  . Hypertension Mother   . Heart disease Mother   . Hyperlipidemia Mother   . Heart failure Mother     . Hypertension Sister   . Hypertension Brother   . Stroke Father   . Hypertension Father   . Colon cancer Maternal Grandmother   . Diabetes Maternal Grandmother    Social History   Socioeconomic History  . Marital status: Married    Spouse name: [name]  . Number of children: 1  . Years of education: 19  . Highest education level: Not on file  Occupational History  . Occupation: Group Home  Social Needs  . Financial resource strain: Not on file  . Food insecurity:    Worry: Not on file    Inability: Not on file  . Transportation needs:    Medical: Not on file    Non-medical: Not on file  Tobacco Use  . Smoking status: Never Smoker  . Smokeless tobacco: Never Used  Substance and Sexual Activity  . Alcohol use: No    Alcohol/week: 0.0 oz    Frequency: Never    Comment: socially  . Drug use: No  . Sexual activity: Not on file  Lifestyle  . Physical activity:    Days per week: Not on file    Minutes per session: Not on file  . Stress: Not on file  Relationships  . Social connections:    Talks on phone: Not on file    Gets together: Not on file    Attends religious service: Not on file    Active member of club or organization: Not on file  Attends meetings of clubs or organizations: Not on file    Relationship status: Not on file  Other Topics Concern  . Not on file  Social History Narrative   Patient drinks caffeine a couple times a week.   Patient is right handed.   Lives with wife [name]) and daughter   Past Surgical History:  Procedure Laterality Date  . APPENDECTOMY    . JOINT REPLACEMENT Left 2004   Left hip  . TOTAL HIP ARTHROPLASTY     Past Medical History:  Diagnosis Date  . Colon cancer ([place])    rectal  . Diabetes mellitus without complication ([place])   . Hypertension   . Seizure ([place])    BP (!) 152/96   Pulse 75   SpO2 96%   Opioid Risk Score:   Fall Risk Score:  `1  Depression screen PHQ 2/9  Depression screen [facility] 2/9  05/11/2017 03/31/2017 02/05/2017  Decreased Interest 0 0 0  Down, Depressed, Hopeless 0 0 0  PHQ - 2 Score 0 0 0    Review of Systems  Constitutional: Negative.   HENT: Negative.   Eyes: Negative.   Respiratory: Negative.   Cardiovascular: Negative.   Gastrointestinal: Negative.   Endocrine: Negative.   Genitourinary: Negative.   Musculoskeletal: Negative.   Skin: Negative.   Allergic/Immunologic: Negative.   Neurological: Negative.   Hematological: Negative.   Psychiatric/Behavioral: Negative.   All other systems reviewed and are negative.      Objective:   Physical Exam  Constitutional: He is oriented to person, place, and time. He appears well-developed and well-nourished.  HENT:  Head: Normocephalic.  Eyes: Pupils are equal, round, and reactive to light. EOM are normal.  Neck: Normal range of motion.  Neurological: He is alert and oriented to person, place, and time. He displays no atrophy and no tremor. He exhibits abnormal muscle tone.  Skin: Skin is warm and dry.  Psychiatric: He has a normal mood and affect.    2- Left FF,FE, WF, WE,3- Bi and Tri 3- Left HF, KE Increase finger flexor tone with clonus      Assessment & Plan:  1.  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  He was hit from further outpatient therapy however he does not have transportation.  His wife works, they have not built a ramp yet and he is unable to get to the current without the assistance of another person.  Therefore he cannot use the scat transportation  2.  Adhesive capsulitis left shoulder partial relief with subacromial injection feel that he would get potentially better results with glenohumeral injection will perform today  Shoulder injection Left glenohumeral   Indication:Left Shoulder pain not relieved by medication management and other conservative care.  Informed consent was obtained after describing risks and benefits of the  procedure with the patient, this includes bleeding, bruising, infection and medication side effects. The patient wishes to proceed and has given written consent. Patient was placed in a seated position. The left shoulder was marked and prepped with betadine in the subacromial area. A 25-gauge 1-1/2 inch needle was inserted into the subacromial area. After negative draw back for blood, a solution containing 1 mL of 6 mg per ML betamethasone and 4 mL of 1% lidocaine was injected. A band aid was applied. The patient tolerated the procedure well. Post procedure instructions were given.

## 2017-05-11 NOTE — Patient Instructions (Signed)
Adhesive Capsulitis Adhesive capsulitis is inflammation of the tendons and ligaments that surround the shoulder joint (shoulder capsule). This condition causes the shoulder to become stiff and painful to move. Adhesive capsulitis is also called frozen shoulder. What are the causes? This condition may be caused by:  An injury to the shoulder joint.  Straining the shoulder.  Not moving the shoulder for a period of time. This can happen if your arm was injured or in a sling.  Long-standing health problems, such as: ? Diabetes. ? Thyroid problems. ? Heart disease. ? Stroke. ? Rheumatoid arthritis. ? Lung disease.  In some cases, the cause may not be known. What increases the risk? This condition is more likely to develop in:  Women.  People who are older than 51 years of age.  What are the signs or symptoms? Symptoms of this condition include:  Pain in the shoulder when moving the arm. There may also be pain when parts of the shoulder are touched. The pain is worse at night or when at rest.  Soreness or aching in the shoulder.  Inability to move the shoulder normally.  Muscle spasms.  How is this diagnosed? This condition is diagnosed with a physical exam and imaging tests, such as an X-ray or MRI. How is this treated? This condition may be treated with:  Treatment of the underlying cause or condition.  Physical therapy. This involves performing exercises to get the shoulder moving again.  Medicine. Medicine may be given to relieve pain, inflammation, or muscle spasms.  Steroid injections into the shoulder joint.  Shoulder manipulation. This is a procedure to move the shoulder into another position. It is done after you are given a medicine to make you fall asleep (general anesthetic). The joint may also be injected with salt water at high pressure to break down scarring.  Surgery. This may be done in severe cases when other treatments have failed.  Although most  people recover completely from adhesive capsulitis, some may not regain the full movement of the shoulder. Follow these instructions at home:  Take over-the-counter and prescription medicines only as told by your health care provider.  If you are being treated with physical therapy, follow instructions from your physical therapist.  Avoid exercises that put a lot of demand on your shoulder, such as throwing. These exercises can make pain worse.  If directed, apply ice to the injured area: ? Put ice in a plastic bag. ? Place a towel between your skin and the bag. ? Leave the ice on for 20 minutes, 2-3 times per day. Contact a health care provider if:  You develop new symptoms.  Your symptoms get worse. This information is not intended to replace advice given to you by your health care provider. Make sure you discuss any questions you have with your health care provider. Document Released: 10/27/2008 Document Revised: 06/07/2015 Document Reviewed: 04/24/2014 Elsevier Interactive Patient Education  2018 Elsevier Inc.  

## 2017-05-14 ENCOUNTER — Telehealth: Payer: Self-pay

## 2017-05-14 ENCOUNTER — Other Ambulatory Visit: Payer: Self-pay | Admitting: Internal Medicine

## 2017-05-14 NOTE — Telephone Encounter (Signed)
Pt called stating he needs a refill on his medication. He did not say what medication.

## 2017-05-14 NOTE — Telephone Encounter (Signed)
Spoke with patient and encouraged phone call to PCP to request refills

## 2017-06-09 ENCOUNTER — Ambulatory Visit (HOSPITAL_BASED_OUTPATIENT_CLINIC_OR_DEPARTMENT_OTHER): Payer: Managed Care, Other (non HMO) | Admitting: Physical Medicine & Rehabilitation

## 2017-06-09 ENCOUNTER — Encounter: Payer: Managed Care, Other (non HMO) | Attending: Physical Medicine & Rehabilitation

## 2017-06-09 ENCOUNTER — Encounter: Payer: Self-pay | Admitting: Physical Medicine & Rehabilitation

## 2017-06-09 VITALS — BP 154/97 | HR 75 | Ht 63.0 in | Wt 205.0 lb

## 2017-06-09 DIAGNOSIS — Z8249 Family history of ischemic heart disease and other diseases of the circulatory system: Secondary | ICD-10-CM | POA: Diagnosis not present

## 2017-06-09 DIAGNOSIS — I69398 Other sequelae of cerebral infarction: Secondary | ICD-10-CM | POA: Insufficient documentation

## 2017-06-09 DIAGNOSIS — G8194 Hemiplegia, unspecified affecting left nondominant side: Secondary | ICD-10-CM | POA: Diagnosis not present

## 2017-06-09 DIAGNOSIS — N318 Other neuromuscular dysfunction of bladder: Secondary | ICD-10-CM

## 2017-06-09 DIAGNOSIS — Z7982 Long term (current) use of aspirin: Secondary | ICD-10-CM | POA: Diagnosis not present

## 2017-06-09 DIAGNOSIS — I10 Essential (primary) hypertension: Secondary | ICD-10-CM | POA: Diagnosis not present

## 2017-06-09 DIAGNOSIS — M7542 Impingement syndrome of left shoulder: Secondary | ICD-10-CM | POA: Diagnosis not present

## 2017-06-09 DIAGNOSIS — E119 Type 2 diabetes mellitus without complications: Secondary | ICD-10-CM | POA: Insufficient documentation

## 2017-06-09 DIAGNOSIS — F329 Major depressive disorder, single episode, unspecified: Secondary | ICD-10-CM | POA: Diagnosis not present

## 2017-06-09 DIAGNOSIS — Z96642 Presence of left artificial hip joint: Secondary | ICD-10-CM | POA: Insufficient documentation

## 2017-06-09 DIAGNOSIS — R569 Unspecified convulsions: Secondary | ICD-10-CM | POA: Diagnosis not present

## 2017-06-09 DIAGNOSIS — Z79899 Other long term (current) drug therapy: Secondary | ICD-10-CM | POA: Diagnosis not present

## 2017-06-09 DIAGNOSIS — Z85048 Personal history of other malignant neoplasm of rectum, rectosigmoid junction, and anus: Secondary | ICD-10-CM | POA: Diagnosis not present

## 2017-06-09 DIAGNOSIS — I739 Peripheral vascular disease, unspecified: Secondary | ICD-10-CM | POA: Insufficient documentation

## 2017-06-09 DIAGNOSIS — N319 Neuromuscular dysfunction of bladder, unspecified: Secondary | ICD-10-CM

## 2017-06-09 DIAGNOSIS — E785 Hyperlipidemia, unspecified: Secondary | ICD-10-CM | POA: Diagnosis not present

## 2017-06-09 DIAGNOSIS — N3289 Other specified disorders of bladder: Secondary | ICD-10-CM | POA: Insufficient documentation

## 2017-06-09 DIAGNOSIS — Z823 Family history of stroke: Secondary | ICD-10-CM | POA: Insufficient documentation

## 2017-06-09 DIAGNOSIS — I69354 Hemiplegia and hemiparesis following cerebral infarction affecting left non-dominant side: Secondary | ICD-10-CM | POA: Diagnosis not present

## 2017-06-09 DIAGNOSIS — I6350 Cerebral infarction due to unspecified occlusion or stenosis of unspecified cerebral artery: Secondary | ICD-10-CM

## 2017-06-09 MED ORDER — OXYBUTYNIN CHLORIDE 5 MG PO TABS: 5.0000 mg | ORAL_TABLET | Freq: Two times a day (BID) | ORAL | 2 refills | Status: DC

## 2017-06-09 NOTE — Patient Instructions (Signed)
OnabotulinumtoxinA injection (Medical Use) What is this medicine? ONABOTULINUMTOXINA (o na BOTT you lye num tox in eh) is a neuro-muscular blocker. This medicine is used to treat crossed eyes, eyelid spasms, severe neck muscle spasms, ankle and toe muscle spasms, and elbow, wrist, and finger muscle spasms. It is also used to treat excessive underarm sweating, to prevent chronic migraine headaches, and to treat loss of bladder control due to neurologic conditions such as multiple sclerosis or spinal cord injury. This medicine may be used for other purposes; ask your health care provider or pharmacist if you have questions. COMMON BRAND NAME(S): Botox What should I tell my health care provider before I take this medicine? They need to know if you have any of these conditions: -breathing problems -cerebral palsy spasms -difficulty urinating -heart problems -history of surgery where this medicine is going to be used -infection at the site where this medicine is going to be used -myasthenia gravis or other neurologic disease -nerve or muscle disease -surgery plans -take medicines that treat or prevent blood clots -thyroid problems -an unusual or allergic reaction to botulinum toxin, albumin, other medicines, foods, dyes, or preservatives -pregnant or trying to get pregnant -breast-feeding How should I use this medicine? This medicine is for injection into a muscle. It is given by a health care professional in a hospital or clinic setting. Talk to your pediatrician regarding the use of this medicine in children. While this drug may be prescribed for children as young as 12 years old for selected conditions, precautions do apply. Overdosage: If you think you have taken too much of this medicine contact a poison control center or emergency room at once. NOTE: This medicine is only for you. Do not share this medicine with others. What if I miss a dose? This does not apply. What may interact with  this medicine? -aminoglycoside antibiotics like gentamicin, neomycin, tobramycin -muscle relaxants -other botulinum toxin injections This list may not describe all possible interactions. Give your health care provider a list of all the medicines, herbs, non-prescription drugs, or dietary supplements you use. Also tell them if you smoke, drink alcohol, or use illegal drugs. Some items may interact with your medicine. What should I watch for while using this medicine? Visit your doctor for regular check ups. This medicine will cause weakness in the muscle where it is injected. Tell your doctor if you feel unusually weak in other muscles. Get medical help right away if you have problems with breathing, swallowing, or talking. This medicine might make your eyelids droop or make you see blurry or double. If you have weak muscles or trouble seeing do not drive a car, use machinery, or do other dangerous activities. This medicine contains albumin from human blood. It may be possible to pass an infection in this medicine, but no cases have been reported. Talk to your doctor about the risks and benefits of this medicine. If your activities have been limited by your condition, go back to your regular routine slowly after treatment with this medicine. What side effects may I notice from receiving this medicine? Side effects that you should report to your doctor or health care professional as soon as possible: -allergic reactions like skin rash, itching or hives, swelling of the face, lips, or tongue -breathing problems -changes in vision -chest pain or tightness -eye irritation, pain -fast, irregular heartbeat -infection -numbness -speech problems -swallowing problems -unusual weakness Side effects that usually do not require medical attention (report to your doctor or health care   professional if they continue or are bothersome): -bruising or pain at site where injected -drooping eyelid -dry eyes or  mouth -headache -muscles aches, pains -sensitivity to light -tearing This list may not describe all possible side effects. Call your doctor for medical advice about side effects. You may report side effects to FDA at 1-800-FDA-1088. Where should I keep my medicine? This drug is given in a hospital or clinic and will not be stored at home. NOTE: This sheet is a summary. It may not cover all possible information. If you have questions about this medicine, talk to your doctor, pharmacist, or health care provider.  2018 Elsevier/Gold Standard (2014-02-07 15:43:53)  

## 2017-06-09 NOTE — Progress Notes (Signed)
Subjective:    Patient ID: [name], male    DOB: 11-Aug-1965, 52 y.o.   MRN: [id number] 52 year old right-handed male with history of hypertension, diabetes mellitus, and chronic calcified brain mass likely meningioma, lives with spouse independent prior to admission.  Wife works during the day.  Presented on 12/12/2016 with left-sided weakness.  Noted blood pressure 240/140.  The patient did report he ran out of his medications a few months ago with financial constraints.  Cranial CT scan unremarkable.  Stable right medial frontal region calcified mass, vasogenic edema right frontal lobe.  The patient did not receive tPA.  CT angiogram of the head and neck negative for large proximal arterial branch occlusion.  MRI showed acute right paramedian brainstem infarct affecting lower pons and upper medulla. HPI Pt has noted increased bladder urgency now using depends, no burning .  Still taking Ditropan 2.5mg  BID.  Left shoulder pain has improved since injection  Left hand and elbow getting tighter, we discussed stretching which he performs, still has trasnportation issues due to no ramp Pain Inventory Average Pain 2 Pain Right Now 2 My pain is intermittent, dull and aching  In the last 24 hours, has pain interfered with the following? General activity 2 Relation with others 2 Enjoyment of life 2 What TIME of day is your pain at its worst? morning Sleep (in general) Good  Pain is worse with: some activites Pain improves with: rest Relief from Meds: na  Mobility use a walker ability to climb steps?  no do you drive?  no use a wheelchair  Function not employed: date last employed .  Neuro/Psych bladder control problems weakness numbness tremor tingling trouble walking spasms  Prior Studies Any changes since last visit?  no  Physicians involved in your care Any changes since last visit?  no   Family History  Problem Relation Age of Onset  .  Hypertension Mother   . Heart disease Mother   . Hyperlipidemia Mother   . Heart failure Mother   . Hypertension Sister   . Hypertension Brother   . Stroke Father   . Hypertension Father   . Colon cancer Maternal Grandmother   . Diabetes Maternal Grandmother    Social History   Socioeconomic History  . Marital status: Married    Spouse name: [name]  . Number of children: 1  . Years of education: 57  . Highest education level: Not on file  Occupational History  . Occupation: Group Home  Social Needs  . Financial resource strain: Not on file  . Food insecurity:    Worry: Not on file    Inability: Not on file  . Transportation needs:    Medical: Not on file    Non-medical: Not on file  Tobacco Use  . Smoking status: Never Smoker  . Smokeless tobacco: Never Used  Substance and Sexual Activity  . Alcohol use: No    Alcohol/week: 0.0 oz    Frequency: Never    Comment: socially  . Drug use: No  . Sexual activity: Not on file  Lifestyle  . Physical activity:    Days per week: Not on file    Minutes per session: Not on file  . Stress: Not on file  Relationships  . Social connections:    Talks on phone: Not on file    Gets together: Not on file    Attends religious service: Not on file    Active member of club or organization: Not on  file    Attends meetings of clubs or organizations: Not on file    Relationship status: Not on file  Other Topics Concern  . Not on file  Social History Narrative   Patient drinks caffeine a couple times a week.   Patient is right handed.   Lives with wife [name]) and daughter   Past Surgical History:  Procedure Laterality Date  . APPENDECTOMY    . JOINT REPLACEMENT Left 2004   Left hip  . TOTAL HIP ARTHROPLASTY     Past Medical History:  Diagnosis Date  . Colon cancer ([place])    rectal  . Diabetes mellitus without complication ([place])   . Hypertension   . Seizure ([place])    Ht 5\' 3"  (1.6 m)   Wt 205 lb (93 kg)   BMI 36.31  kg/m   Opioid Risk Score:   Fall Risk Score:  `1  Depression screen PHQ 2/9  Depression screen [facility] 2/9 05/11/2017 03/31/2017 02/05/2017  Decreased Interest 0 0 0  Down, Depressed, Hopeless 0 0 0  PHQ - 2 Score 0 0 0     Review of Systems  Constitutional: Negative.   HENT: Negative.   Eyes: Negative.   Respiratory: Negative.   Cardiovascular: Negative.   Gastrointestinal: Negative.   Endocrine: Negative.   Genitourinary: Positive for difficulty urinating.  Musculoskeletal: Positive for myalgias.  Skin: Negative.   Allergic/Immunologic: Negative.   Neurological: Positive for tremors, weakness and numbness.  Hematological: Negative.   Psychiatric/Behavioral: Negative.   All other systems reviewed and are negative.      Objective:   Physical Exam  Constitutional: He is oriented to person, place, and time. He appears well-developed and well-nourished.  HENT:  Head: Normocephalic and atraumatic.  Eyes: Pupils are equal, round, and reactive to light. EOM are normal.  Musculoskeletal:  Mildly positive Left shoulder impingement with 2/10 pain.  No pain with Int/Ext rotation , has mild endrange limitation with internal and external rotation  Neurological: He is alert and oriented to person, place, and time.  MAS 3 tone at Left elbow flexors and finger and wrist flexors  Skin: Skin is warm and dry.  Psychiatric: He has a normal mood and affect.  Nursing note and vitals reviewed.  Motor 3- L Delt Bi, Tri Grip, L ADF, 4- at L QUAD  Amb NT does not have walker with him     Assessment & Plan:  1.  Left spastic hemiparesis due to Right Brainstem infarct Rec OP PT, OT  Spasticity increased pt already with decreased energy level will avoid oral spasticity meds  Trial BOtox 200U LEFT FDS 50 FDP 50 Bic 50 FCR 25 FDP 25  RTC 2wks  2.  Spastic bladder CVA increase oxybutnin to 5mg  BID  3. Left shoulder impingement improved after last injection monitor , cont self ROM

## 2017-06-16 ENCOUNTER — Other Ambulatory Visit: Payer: Self-pay

## 2017-06-16 MED ORDER — ATORVASTATIN CALCIUM 80 MG PO TABS: ORAL_TABLET | ORAL | 1 refills | Status: DC

## 2017-06-16 MED ORDER — VALSARTAN 160 MG PO TABS: 160.0000 mg | ORAL_TABLET | Freq: Every day | ORAL | 1 refills | Status: DC

## 2017-06-16 MED ORDER — CARVEDILOL 25 MG PO TABS: ORAL_TABLET | ORAL | 2 refills | Status: DC

## 2017-06-30 ENCOUNTER — Encounter: Payer: Managed Care, Other (non HMO) | Attending: Physical Medicine & Rehabilitation

## 2017-06-30 ENCOUNTER — Encounter: Payer: Self-pay | Admitting: Physical Medicine & Rehabilitation

## 2017-06-30 ENCOUNTER — Other Ambulatory Visit: Payer: Self-pay

## 2017-06-30 ENCOUNTER — Ambulatory Visit (HOSPITAL_BASED_OUTPATIENT_CLINIC_OR_DEPARTMENT_OTHER): Payer: Medicaid Other | Admitting: Physical Medicine & Rehabilitation

## 2017-06-30 VITALS — BP 143/98 | HR 85

## 2017-06-30 DIAGNOSIS — Z79899 Other long term (current) drug therapy: Secondary | ICD-10-CM | POA: Diagnosis not present

## 2017-06-30 DIAGNOSIS — Z96642 Presence of left artificial hip joint: Secondary | ICD-10-CM | POA: Diagnosis not present

## 2017-06-30 DIAGNOSIS — I739 Peripheral vascular disease, unspecified: Secondary | ICD-10-CM | POA: Insufficient documentation

## 2017-06-30 DIAGNOSIS — I6350 Cerebral infarction due to unspecified occlusion or stenosis of unspecified cerebral artery: Secondary | ICD-10-CM

## 2017-06-30 DIAGNOSIS — Z85048 Personal history of other malignant neoplasm of rectum, rectosigmoid junction, and anus: Secondary | ICD-10-CM | POA: Insufficient documentation

## 2017-06-30 DIAGNOSIS — I69398 Other sequelae of cerebral infarction: Secondary | ICD-10-CM | POA: Insufficient documentation

## 2017-06-30 DIAGNOSIS — R569 Unspecified convulsions: Secondary | ICD-10-CM | POA: Diagnosis not present

## 2017-06-30 DIAGNOSIS — I10 Essential (primary) hypertension: Secondary | ICD-10-CM | POA: Diagnosis not present

## 2017-06-30 DIAGNOSIS — Z8249 Family history of ischemic heart disease and other diseases of the circulatory system: Secondary | ICD-10-CM | POA: Insufficient documentation

## 2017-06-30 DIAGNOSIS — F329 Major depressive disorder, single episode, unspecified: Secondary | ICD-10-CM | POA: Insufficient documentation

## 2017-06-30 DIAGNOSIS — Z7982 Long term (current) use of aspirin: Secondary | ICD-10-CM | POA: Diagnosis not present

## 2017-06-30 DIAGNOSIS — E119 Type 2 diabetes mellitus without complications: Secondary | ICD-10-CM | POA: Diagnosis not present

## 2017-06-30 DIAGNOSIS — Z823 Family history of stroke: Secondary | ICD-10-CM | POA: Diagnosis not present

## 2017-06-30 DIAGNOSIS — N3289 Other specified disorders of bladder: Secondary | ICD-10-CM | POA: Insufficient documentation

## 2017-06-30 DIAGNOSIS — I69354 Hemiplegia and hemiparesis following cerebral infarction affecting left non-dominant side: Secondary | ICD-10-CM | POA: Insufficient documentation

## 2017-06-30 DIAGNOSIS — G8114 Spastic hemiplegia affecting left nondominant side: Secondary | ICD-10-CM | POA: Diagnosis not present

## 2017-06-30 DIAGNOSIS — E785 Hyperlipidemia, unspecified: Secondary | ICD-10-CM | POA: Diagnosis not present

## 2017-06-30 NOTE — Progress Notes (Signed)
Botox Injection for spasticity using needle EMG guidance  Dilution: 50 Units/ml Indication: Severe spasticity which interferes with ADL,mobility and/or  hygiene and is unresponsive to medication management and other conservative care Informed consent was obtained after describing risks and benefits of the procedure with the patient. This includes bleeding, bruising, infection, excessive weakness, or medication side effects. A REMS form is on file and signed. Needle: 25g 2" needle electrode Number of units per muscle LEFT FDS 50 FDP 50 Bic 50 FCR 25 FDP 25 All injections were done after obtaining appropriate EMG activity and after negative drawback for blood. The patient tolerated the procedure well. Post procedure instructions were given. A followup appointment was made.

## 2017-06-30 NOTE — Patient Instructions (Signed)

## 2017-07-25 ENCOUNTER — Encounter (HOSPITAL_COMMUNITY): Payer: Self-pay | Admitting: Emergency Medicine

## 2017-07-25 ENCOUNTER — Ambulatory Visit (HOSPITAL_COMMUNITY)
Admission: EM | Admit: 2017-07-25 | Discharge: 2017-07-25 | Disposition: A | Discharge status: Home / Self Care | Payer: Managed Care, Other (non HMO) | Attending: Internal Medicine | Admitting: Internal Medicine

## 2017-07-25 ENCOUNTER — Ambulatory Visit (INDEPENDENT_AMBULATORY_CARE_PROVIDER_SITE_OTHER): Payer: Managed Care, Other (non HMO)

## 2017-07-25 DIAGNOSIS — S63502A Unspecified sprain of left wrist, initial encounter: Secondary | ICD-10-CM

## 2017-07-25 DIAGNOSIS — W19XXXA Unspecified fall, initial encounter: Secondary | ICD-10-CM

## 2017-07-25 DIAGNOSIS — W2209XA Striking against other stationary object, initial encounter: Secondary | ICD-10-CM | POA: Diagnosis not present

## 2017-07-25 DIAGNOSIS — W06XXXA Fall from bed, initial encounter: Secondary | ICD-10-CM

## 2017-07-25 DIAGNOSIS — S0093XA Contusion of unspecified part of head, initial encounter: Secondary | ICD-10-CM

## 2017-07-25 DIAGNOSIS — I69354 Hemiplegia and hemiparesis following cerebral infarction affecting left non-dominant side: Secondary | ICD-10-CM

## 2017-07-25 NOTE — ED Triage Notes (Signed)
Pt states he fell out of bed this morning. Pt is wheelchair bound. Pt landed on his L arm, and hit his head on the night stand. No loc. NO obvious injury to head. Not on blood thinners.

## 2017-07-25 NOTE — ED Provider Notes (Signed)
[place]    CSN: [id number] Arrival date & time: 07/25/17  1317     History   Chief Complaint Chief Complaint  Patient presents with  . Fall  . Arm Pain    HPI [name] is a 52 y.o. male.   Dimitrios presents with complaints of left wrist pain as well as left temporal contusion s/p fall this morning. He has left sided weakness s/p stroke last November. Was getting out of bed this morning to go to the restroom and slid off the bed, falling to the floor on his left side. His left head struck his side table. No loss of consciousness. No nausea, vomiting, dizziness, headache. No numbness or tingling to left hand. No neck pain. No confusion or change in mentation. Pain to left wrist with any movement. Has not taken any medications for pain. Hx of rectal cancer, DM, htn, seizures, CVA, left hemiparesis.    ROS per HPI.      Past Medical History:  Diagnosis Date  . Colon cancer ([place])    rectal  . Diabetes mellitus without complication ([place])   . Hypertension   . Seizure [facility])     Patient Active Problem List   Diagnosis Date Noted  . Subacromial impingement of left shoulder 06/09/2017  . Spastic neurogenic bladder 06/09/2017  . Dysarthria as late effect of stroke 04/01/2017  . Neurogenic bladder due to old stroke 12/25/2016  . Right pontine cerebrovascular accident ([place]) 12/18/2016  . Left hemiparesis ([place])   . Diastolic dysfunction   . Dysphagia, post-stroke   . Acute ischemic stroke ([place]) 12/12/2016  . Seizures ([place]) 07/22/2014  . Hypertension 07/22/2014  . Type 2 diabetes mellitus with vascular disease ([place]) 07/22/2014  . Brain mass   . Malignant neoplasm of rectum ([place]) 09/02/2007    Past Surgical History:  Procedure Laterality Date  . APPENDECTOMY    . JOINT REPLACEMENT Left 2004   Left hip  . TOTAL HIP ARTHROPLASTY         Home Medications    Prior to Admission medications   Medication Sig Start Date End Date Taking? Authorizing  Provider  amLODipine (NORVASC) 10 MG tablet Take 1 tablet (10 mg total) by mouth daily. 03/10/17   Kirsteins, Luanna Salk, MD  aspirin 325 MG tablet Take 1 tablet (325 mg total) by mouth daily. 12/19/16   Velvet Bathe, MD  atorvastatin (LIPITOR) 80 MG tablet TAKE 1 TABLET BY MOUTH EVERY DAY AT 6PM 06/16/17   Ladell Pier, MD  carvedilol (COREG) 25 MG tablet TAKE 1 TABLET BY MOUTH TWICE A DAY WITH A MEAL 06/16/17   Ladell Pier, MD  FLUoxetine (PROZAC) 10 MG capsule Take 1 capsule (10 mg total) by mouth daily. 03/10/17   Kirsteins, Luanna Salk, MD  Melatonin 3 MG TABS Take 3 tablets (9 mg total) by mouth at bedtime. 01/08/17   Angiulli, Lavon Paganini, PA-C  oxybutynin (DITROPAN) 5 MG tablet Take 1 tablet (5 mg total) by mouth 2 (two) times daily. 06/09/17   Kirsteins, Luanna Salk, MD  valsartan (DIOVAN) 160 MG tablet Take 1 tablet (160 mg total) by mouth daily. 06/16/17   Ladell Pier, MD    Family History Family History  Problem Relation Age of Onset  . Hypertension Mother   . Heart disease Mother   . Hyperlipidemia Mother   . Heart failure Mother   . Hypertension Sister   . Hypertension Brother   . Stroke Father   .  Hypertension Father   . Colon cancer Maternal Grandmother   . Diabetes Maternal Grandmother     Social History Social History   Tobacco Use  . Smoking status: Never Smoker  . Smokeless tobacco: Never Used  Substance Use Topics  . Alcohol use: No    Alcohol/week: 0.0 oz    Frequency: Never    Comment: socially  . Drug use: No     Allergies   Patient has no known allergies.   Review of Systems Review of Systems   Physical Exam Triage Vital Signs ED Triage Vitals [07/25/17 1438]  Enc Vitals Group     BP (!) 147/108     Pulse Rate 92     Resp 18     Temp 98.7 F (37.1 C)     Temp src      SpO2 100 %     Weight      Height      Head Circumference      Peak Flow      Pain Score      Pain Loc      Pain Edu?      Excl. in [place]?    No data  found.  Updated Vital Signs BP (!) 147/108   Pulse 92   Temp 98.7 F (37.1 C)   Resp 18   SpO2 100%   Visual Acuity Right Eye Distance:   Left Eye Distance:   Bilateral Distance:    Right Eye Near:   Left Eye Near:    Bilateral Near:     Physical Exam  Constitutional: He is oriented to person, place, and time. He appears well-developed and well-nourished.  HENT:  Head: Normocephalic.    Small amount of tenderness above left temple; without skin breakdown or hematoma present; left external ear with small skin tear  Cardiovascular: Normal rate and regular rhythm.  Pulmonary/Chest: Effort normal and breath sounds normal.  Musculoskeletal:       Left elbow: Normal.       Left wrist: He exhibits decreased range of motion, tenderness and bony tenderness. He exhibits no swelling.       Left hand: He exhibits decreased range of motion. He exhibits no tenderness, normal capillary refill, no deformity, no laceration and no swelling. Normal sensation noted. Decreased strength noted.  Baseline hemiparesis to left extremity; patient able to weakly move fingers to make fist with left hand, no pain; pain with passive flexion of left wrist to central and radial aspects of wrist; no thumb pain; no swelling; strong radial pulse   Neurological: He is alert and oriented to person, place, and time.  Skin: Skin is warm and dry.     UC Treatments / Results  Labs (all labs ordered are listed, but only abnormal results are displayed) Labs Reviewed - No data to display  EKG None  Radiology Dg Wrist Complete Left  Result Date: 07/25/2017 CLINICAL DATA:  Patient states that he fell from his wheelchair this morning injuring his left wrist, no use of left side due to stroke last year, EXAM: LEFT WRIST - COMPLETE 3+ VIEW COMPARISON:  None. FINDINGS: No fracture or dislocation. No bone lesion. Bones are diffusely demineralized. There is an ulnar minus variance. Mild soft tissue swelling is  suggested. IMPRESSION: No fracture or dislocation. Electronically Signed   By: Lajean Manes M.D.   On: 07/25/2017 15:20    Procedures Procedures (including critical care time)  Medications Ordered in UC Medications - No data  to display  Initial Impression / Assessment and Plan / UC Course  I have reviewed the triage vital signs and the nursing notes.  Pertinent labs & imaging results that were available during my care of the patient were reviewed by me and considered in my medical decision making (see chart for details).     Negative xray today. Wrist splint provided for comfort. Tylenol for pain. Rice therapy. Passive ROM encouraged to fingers and wrist as able. Neurologically intact, no red flag findings related to head injury. Return precautions provided. Patient and family verbalized understanding and agreeable to plan.     Final Clinical Impressions(s) / UC Diagnoses   Final diagnoses:  Fall, initial encounter  Wrist sprain, left, initial encounter  Contusion of head, unspecified part of head, initial encounter     Discharge Instructions     Ice, elevation of left wrist. Use of brace for the next 1-2 weeks as needed for comfort and support. At least nightly perform range of motion with hand and wrist.  Tylenol as needed for pain.  Ice to head as needed for swelling.  If symptoms worsen or do not improve in the next week to return to be seen or to follow up with your PCP.     ED Prescriptions    None     Controlled Substance Prescriptions [place] Controlled Substance Registry consulted? Not Applicable   Zigmund Gottron, NP 07/25/17 1541

## 2017-07-25 NOTE — Discharge Instructions (Signed)
Ice, elevation of left wrist. Use of brace for the next 1-2 weeks as needed for comfort and support. At least nightly perform range of motion with hand and wrist.  Tylenol as needed for pain.  Ice to head as needed for swelling.  If symptoms worsen or do not improve in the next week to return to be seen or to follow up with your PCP.

## 2017-08-10 ENCOUNTER — Ambulatory Visit: Payer: Managed Care, Other (non HMO) | Admitting: Physical Medicine & Rehabilitation

## 2017-08-17 ENCOUNTER — Encounter: Payer: Managed Care, Other (non HMO) | Attending: Physical Medicine & Rehabilitation

## 2017-08-17 ENCOUNTER — Ambulatory Visit (HOSPITAL_BASED_OUTPATIENT_CLINIC_OR_DEPARTMENT_OTHER): Payer: Managed Care, Other (non HMO) | Admitting: Physical Medicine & Rehabilitation

## 2017-08-17 ENCOUNTER — Encounter: Payer: Self-pay | Admitting: Physical Medicine & Rehabilitation

## 2017-08-17 VITALS — BP 159/98 | HR 83 | Ht 63.0 in | Wt 212.0 lb

## 2017-08-17 DIAGNOSIS — I10 Essential (primary) hypertension: Secondary | ICD-10-CM | POA: Insufficient documentation

## 2017-08-17 DIAGNOSIS — G8114 Spastic hemiplegia affecting left nondominant side: Secondary | ICD-10-CM

## 2017-08-17 DIAGNOSIS — I739 Peripheral vascular disease, unspecified: Secondary | ICD-10-CM | POA: Insufficient documentation

## 2017-08-17 DIAGNOSIS — F329 Major depressive disorder, single episode, unspecified: Secondary | ICD-10-CM | POA: Diagnosis not present

## 2017-08-17 DIAGNOSIS — R569 Unspecified convulsions: Secondary | ICD-10-CM | POA: Insufficient documentation

## 2017-08-17 DIAGNOSIS — Z96642 Presence of left artificial hip joint: Secondary | ICD-10-CM | POA: Insufficient documentation

## 2017-08-17 DIAGNOSIS — Z79899 Other long term (current) drug therapy: Secondary | ICD-10-CM | POA: Diagnosis not present

## 2017-08-17 DIAGNOSIS — Z7982 Long term (current) use of aspirin: Secondary | ICD-10-CM | POA: Insufficient documentation

## 2017-08-17 DIAGNOSIS — I69398 Other sequelae of cerebral infarction: Secondary | ICD-10-CM | POA: Diagnosis not present

## 2017-08-17 DIAGNOSIS — E785 Hyperlipidemia, unspecified: Secondary | ICD-10-CM | POA: Insufficient documentation

## 2017-08-17 DIAGNOSIS — E119 Type 2 diabetes mellitus without complications: Secondary | ICD-10-CM | POA: Diagnosis not present

## 2017-08-17 DIAGNOSIS — Z85048 Personal history of other malignant neoplasm of rectum, rectosigmoid junction, and anus: Secondary | ICD-10-CM | POA: Diagnosis not present

## 2017-08-17 DIAGNOSIS — I69354 Hemiplegia and hemiparesis following cerebral infarction affecting left non-dominant side: Secondary | ICD-10-CM | POA: Diagnosis not present

## 2017-08-17 DIAGNOSIS — Z8249 Family history of ischemic heart disease and other diseases of the circulatory system: Secondary | ICD-10-CM | POA: Diagnosis not present

## 2017-08-17 DIAGNOSIS — N3289 Other specified disorders of bladder: Secondary | ICD-10-CM | POA: Insufficient documentation

## 2017-08-17 DIAGNOSIS — Z823 Family history of stroke: Secondary | ICD-10-CM | POA: Insufficient documentation

## 2017-08-17 NOTE — Patient Instructions (Signed)
Will repeat Botox next month  Will need to take form to [facility]

## 2017-08-17 NOTE — Progress Notes (Signed)
Subjective:    Patient ID: [name], male    DOB: 1965-10-09, 52 y.o.   MRN: [id number] 52 year old right-handed male with history of hypertension, diabetes mellitus, and chronic calcified brain mass likely meningioma, lives with spouse independent prior to admission.  Wife works during the day.  Presented on 12/12/2016 with left-sided weakness.  Noted blood pressure 240/140.  The patient did report he ran out of his medications a few months ago with financial constraints.  Cranial CT scan unremarkable.  Stable right medial frontal region calcified mass, vasogenic edema right frontal lobe.  The patient did not receive tPA.  CT angiogram of the head and neck negative for large proximal arterial branch occlusion.  MRI showed acute right paramedian brainstem infarct affecting lower pons and upper medulla. HPI Botox 06/30/17 FDS 50 FDP 50 Bic 50 FCR 25 FDP 25  Elbow feels looser , fingers feel about the same, wrist feels loose. No sig thumb spasticity noted No new issues since last visit Pain Inventory Average Pain 3 Pain Right Now 0 My pain is intermittent and dull  In the last 24 hours, has pain interfered with the following? General activity 0 Relation with others 0 Enjoyment of life 0 What TIME of day is your pain at its worst? varies Sleep (in general) Good  Pain is worse with: standing Pain improves with: none Relief from Meds: no meds  Mobility walk with assistance use a cane use a walker how many minutes can you walk? 15 ability to climb steps?  no do you drive?  no use a wheelchair transfers alone  Function disabled: date disabled n/a I need assistance with the following:  dressing, bathing, meal prep, household duties and shopping  Neuro/Psych tremor trouble walking  Prior Studies Any changes since last visit?  no  Physicians involved in your care Any changes since last visit?  no   Family History  Problem Relation Age of Onset  .  Hypertension Mother   . Heart disease Mother   . Hyperlipidemia Mother   . Heart failure Mother   . Hypertension Sister   . Hypertension Brother   . Stroke Father   . Hypertension Father   . Colon cancer Maternal Grandmother   . Diabetes Maternal Grandmother    Social History   Socioeconomic History  . Marital status: Married    Spouse name: [name]  . Number of children: 1  . Years of education: 68  . Highest education level: Not on file  Occupational History  . Occupation: Group Home  Social Needs  . Financial resource strain: Not on file  . Food insecurity:    Worry: Not on file    Inability: Not on file  . Transportation needs:    Medical: Not on file    Non-medical: Not on file  Tobacco Use  . Smoking status: Never Smoker  . Smokeless tobacco: Never Used  Substance and Sexual Activity  . Alcohol use: No    Alcohol/week: 0.0 oz    Frequency: Never    Comment: socially  . Drug use: No  . Sexual activity: Not on file  Lifestyle  . Physical activity:    Days per week: Not on file    Minutes per session: Not on file  . Stress: Not on file  Relationships  . Social connections:    Talks on phone: Not on file    Gets together: Not on file    Attends religious service: Not on file  Active member of club or organization: Not on file    Attends meetings of clubs or organizations: Not on file    Relationship status: Not on file  Other Topics Concern  . Not on file  Social History Narrative   Patient drinks caffeine a couple times a week.   Patient is right handed.   Lives with wife [name]) and daughter   Past Surgical History:  Procedure Laterality Date  . APPENDECTOMY    . JOINT REPLACEMENT Left 2004   Left hip  . TOTAL HIP ARTHROPLASTY     Past Medical History:  Diagnosis Date  . Colon cancer ([place])    rectal  . Diabetes mellitus without complication ([place])   . Hypertension   . Seizure ([place])    BP (!) 159/98   Pulse 83   Ht 5\' 3"  (1.6 m)  Comment: pt reported, in wheelchair  Wt 212 lb (96.2 kg) Comment: pt reported, in wheelchair  SpO2 97%   BMI 37.55 kg/m   Opioid Risk Score:   Fall Risk Score:  `1  Depression screen PHQ 2/9  Depression screen [facility] 2/9 06/30/2017 05/11/2017 03/31/2017 02/05/2017  Decreased Interest 0 0 0 0  Down, Depressed, Hopeless 0 0 0 0  PHQ - 2 Score 0 0 0 0    Review of Systems     Objective:   Physical Exam  Constitutional: He is oriented to person, place, and time. He appears well-developed and well-nourished. No distress.  HENT:  Head: Normocephalic and atraumatic.  Eyes: Pupils are equal, round, and reactive to light. EOM are normal.  Neurological: He is alert and oriented to person, place, and time.  Dysarthric  Rediced sensation Left little finger  Skin: Skin is warm and dry.  Nursing note and vitals reviewed.  Motor 3- Left delt , bi, tri, grip ADF, 4/5 Left Quad MAS 1 Left elbow, MAS 0 Left wrist , MAS 3 Left finger flexors      Assessment & Plan:  1.  Left spastic hemiplegia secondary to CVA LEFT FDS 50 FDP 50 Bic 50 FCR 25 FCU 25  Will order therapy after next visit

## 2017-09-10 ENCOUNTER — Other Ambulatory Visit: Payer: Self-pay | Admitting: Physical Medicine & Rehabilitation

## 2017-09-25 ENCOUNTER — Telehealth: Payer: Self-pay

## 2017-09-25 NOTE — Telephone Encounter (Signed)
Pt called requesting a refill on Coreg. I informed pt that the medication was not prescribed by any doctor at this office. I advised me to call [facility] and request a refill and they would be able to get it to the right office for refill.

## 2017-09-28 ENCOUNTER — Encounter: Payer: Managed Care, Other (non HMO) | Attending: Physical Medicine & Rehabilitation

## 2017-09-28 ENCOUNTER — Ambulatory Visit (HOSPITAL_BASED_OUTPATIENT_CLINIC_OR_DEPARTMENT_OTHER): Payer: Managed Care, Other (non HMO) | Admitting: Physical Medicine & Rehabilitation

## 2017-09-28 ENCOUNTER — Other Ambulatory Visit: Payer: Self-pay

## 2017-09-28 VITALS — BP 183/114 | HR 95 | Ht 63.0 in | Wt 220.0 lb

## 2017-09-28 DIAGNOSIS — I69354 Hemiplegia and hemiparesis following cerebral infarction affecting left non-dominant side: Secondary | ICD-10-CM | POA: Insufficient documentation

## 2017-09-28 DIAGNOSIS — G8114 Spastic hemiplegia affecting left nondominant side: Secondary | ICD-10-CM | POA: Diagnosis not present

## 2017-09-28 DIAGNOSIS — E119 Type 2 diabetes mellitus without complications: Secondary | ICD-10-CM | POA: Insufficient documentation

## 2017-09-28 DIAGNOSIS — N3289 Other specified disorders of bladder: Secondary | ICD-10-CM | POA: Diagnosis not present

## 2017-09-28 DIAGNOSIS — Z823 Family history of stroke: Secondary | ICD-10-CM | POA: Insufficient documentation

## 2017-09-28 DIAGNOSIS — F329 Major depressive disorder, single episode, unspecified: Secondary | ICD-10-CM | POA: Insufficient documentation

## 2017-09-28 DIAGNOSIS — Z85048 Personal history of other malignant neoplasm of rectum, rectosigmoid junction, and anus: Secondary | ICD-10-CM | POA: Insufficient documentation

## 2017-09-28 DIAGNOSIS — I10 Essential (primary) hypertension: Secondary | ICD-10-CM | POA: Diagnosis not present

## 2017-09-28 DIAGNOSIS — R569 Unspecified convulsions: Secondary | ICD-10-CM | POA: Insufficient documentation

## 2017-09-28 DIAGNOSIS — Z96642 Presence of left artificial hip joint: Secondary | ICD-10-CM | POA: Insufficient documentation

## 2017-09-28 DIAGNOSIS — I6350 Cerebral infarction due to unspecified occlusion or stenosis of unspecified cerebral artery: Secondary | ICD-10-CM

## 2017-09-28 DIAGNOSIS — Z79899 Other long term (current) drug therapy: Secondary | ICD-10-CM | POA: Diagnosis not present

## 2017-09-28 DIAGNOSIS — Z8249 Family history of ischemic heart disease and other diseases of the circulatory system: Secondary | ICD-10-CM | POA: Diagnosis not present

## 2017-09-28 DIAGNOSIS — E785 Hyperlipidemia, unspecified: Secondary | ICD-10-CM | POA: Insufficient documentation

## 2017-09-28 DIAGNOSIS — I739 Peripheral vascular disease, unspecified: Secondary | ICD-10-CM | POA: Insufficient documentation

## 2017-09-28 DIAGNOSIS — Z7982 Long term (current) use of aspirin: Secondary | ICD-10-CM | POA: Diagnosis not present

## 2017-09-28 DIAGNOSIS — I69398 Other sequelae of cerebral infarction: Secondary | ICD-10-CM | POA: Insufficient documentation

## 2017-09-28 NOTE — Patient Instructions (Signed)
Plan botox next visit and order PT, OT after botox injection

## 2017-09-28 NOTE — Progress Notes (Signed)
Subjective:    Patient ID: [name], male    DOB: 06-Jun-1965, 52 y.o.   MRN: [id number] 27-year-old right-handed male with history of hypertension, diabetes mellitus, and chronic calcified brain mass likely meningioma, lives with spouse independent prior to admission.  Wife works during the day.  Presented on 12/12/2016 with left-sided weakness.  Noted blood pressure 240/140.  The patient did report he ran out of his medications a few months ago with financial constraints.  Cranial CT scan unremarkable.  Stable right medial frontal region calcified mass, vasogenic edema right frontal lobe.  The patient did not receive tPA.  CT angiogram of the head and neck negative for large proximal arterial branch occlusion.  MRI showed acute right paramedian brainstem infarct affecting lower pons and upper medulla. Echocardiogram with ejection fraction of 82%, grade 1 diastolic dysfunction, no emboli.  Maintained on aspirin for CVA prophylaxis. Subcutaneous Lovenox for DVT prophylaxis.  Diet slowly advanced. DATE OF ADMISSION:  12/18/2016 DATE OF DISCHARGE:  01/08/2017  HPI Completed HH PT, OT Now on medicaid Has not had OP therapy  Still taking med for neurogenic bladder, ditropan effective for spastic bladder  Left and and wrist getting tighter  See PCP for BP  Didn't take BP meds today Pain Inventory Average Pain 0 Pain Right Now 0 My pain is no pain  In the last 24 hours, has pain interfered with the following? General activity 0 Relation with others 0 Enjoyment of life 0 What TIME of day is your pain at its worst? no pain Sleep (in general) Good  Pain is worse with: no pain Pain improves with: no pain Relief from Meds: no meds  Mobility walk with assistance use a walker ability to climb steps?  no do you drive?  no use a wheelchair transfers alone  Function disabled: date disabled n/a I need assistance with the following:  meal prep, household duties and  shopping  Neuro/Psych tremor trouble walking  Prior Studies Any changes since last visit?  no  Physicians involved in your care Any changes since last visit?  no   Family History  Problem Relation Age of Onset  . Hypertension Mother   . Heart disease Mother   . Hyperlipidemia Mother   . Heart failure Mother   . Hypertension Sister   . Hypertension Brother   . Stroke Father   . Hypertension Father   . Colon cancer Maternal Grandmother   . Diabetes Maternal Grandmother    Social History   Socioeconomic History  . Marital status: Married    Spouse name: [name]  . Number of children: 1  . Years of education: 13  . Highest education level: Not on file  Occupational History  . Occupation: Group Home  Social Needs  . Financial resource strain: Not on file  . Food insecurity:    Worry: Not on file    Inability: Not on file  . Transportation needs:    Medical: Not on file    Non-medical: Not on file  Tobacco Use  . Smoking status: Never Smoker  . Smokeless tobacco: Never Used  Substance and Sexual Activity  . Alcohol use: No    Alcohol/week: 0.0 standard drinks    Frequency: Never    Comment: socially  . Drug use: No  . Sexual activity: Not on file  Lifestyle  . Physical activity:    Days per week: Not on file    Minutes per session: Not on file  . Stress: Not  on file  Relationships  . Social connections:    Talks on phone: Not on file    Gets together: Not on file    Attends religious service: Not on file    Active member of club or organization: Not on file    Attends meetings of clubs or organizations: Not on file    Relationship status: Not on file  Other Topics Concern  . Not on file  Social History Narrative   Patient drinks caffeine a couple times a week.   Patient is right handed.   Lives with wife [name]) and daughter   Past Surgical History:  Procedure Laterality Date  . APPENDECTOMY    . JOINT REPLACEMENT Left 2004   Left hip  .  TOTAL HIP ARTHROPLASTY     Past Medical History:  Diagnosis Date  . Colon cancer ([place])    rectal  . Diabetes mellitus without complication ([place])   . Hypertension   . Seizure ([place])    BP (!) 183/114   Pulse 95   Ht 5\' 3"  (1.6 m) Comment: pt reported, in wheelchair  Wt 220 lb (99.8 kg) Comment: pt reported, in wheelchair  SpO2 97%   BMI 38.97 kg/m   Opioid Risk Score:   Fall Risk Score:  `1  Depression screen PHQ 2/9  Depression screen [facility] 2/9 09/28/2017 06/30/2017 05/11/2017 03/31/2017 02/05/2017  Decreased Interest 0 0 0 0 0  Down, Depressed, Hopeless 0 0 0 0 0  PHQ - 2 Score 0 0 0 0 0    Review of Systems  HENT: Negative.   Eyes: Negative.   Respiratory: Positive for cough and shortness of breath.   Cardiovascular: Negative.   Gastrointestinal: Negative.   Endocrine: Negative.   Genitourinary: Negative.   Musculoskeletal: Negative.   Skin: Negative.   Allergic/Immunologic: Negative.   Neurological: Negative.   Hematological: Negative.   Psychiatric/Behavioral: Negative.   All other systems reviewed and are negative.      Objective:   Physical Exam  Constitutional: He is oriented to person, place, and time. He appears well-developed and well-nourished. No distress.  HENT:  Head: Normocephalic and atraumatic.  Eyes: Pupils are equal, round, and reactive to light.  Neck: Normal range of motion.  Neurological: He is alert and oriented to person, place, and time.  Skin: Skin is warm and dry. He is not diaphoretic.  Psychiatric: He has a normal mood and affect.  Nursing note and vitals reviewed.   MAS 3 in finger flexors esp digits 2 and 3 MAS 2/3 in Wrist flexors,  MAS 0-1 at biceps  Motor 3- Left elboflexors, 2- delt, 0/5 finger ext 4/5 Left HF and KE, tr ankle DF, 2- anklePF     Assessment & Plan:  1.  Left spastic hemi due to Right brainstemn infarct, rec repeat Botox , , will not need to inject elbow flxor group but would increase dose to finger  and  thumb flexor  FDS 50 FDP 50 FPL 50 FCR 25 FCU 25

## 2017-10-19 ENCOUNTER — Ambulatory Visit: Payer: Managed Care, Other (non HMO) | Admitting: Physical Medicine & Rehabilitation

## 2017-10-22 ENCOUNTER — Other Ambulatory Visit: Payer: Self-pay | Admitting: Physical Medicine & Rehabilitation

## 2017-11-02 ENCOUNTER — Encounter: Payer: Self-pay | Admitting: Physical Medicine & Rehabilitation

## 2017-11-02 ENCOUNTER — Ambulatory Visit: Payer: Managed Care, Other (non HMO) | Admitting: Physical Medicine & Rehabilitation

## 2017-11-02 ENCOUNTER — Telehealth: Payer: Self-pay | Admitting: Internal Medicine

## 2017-11-02 ENCOUNTER — Other Ambulatory Visit: Payer: Self-pay | Admitting: Internal Medicine

## 2017-11-02 DIAGNOSIS — I10 Essential (primary) hypertension: Secondary | ICD-10-CM

## 2017-11-02 MED ORDER — CARVEDILOL 25 MG PO TABS: ORAL_TABLET | ORAL | 0 refills | Status: DC

## 2017-11-02 MED ORDER — AMLODIPINE BESYLATE 10 MG PO TABS: 10.0000 mg | ORAL_TABLET | Freq: Every day | ORAL | 0 refills | Status: DC

## 2017-11-02 NOTE — Patient Instructions (Signed)
Call Karle Plumber MD for refill of your medication

## 2017-11-02 NOTE — Progress Notes (Unsigned)
Doc kirsteins needs to delete this note. l l l l l l l l l l l l l l l l l l l l

## 2017-11-02 NOTE — Telephone Encounter (Signed)
Rx sent for Carvedilol, pt was informed by Chabely at the front desk that they have sufficient with the pharmacy for Valsartan and Atorvastatin.

## 2017-11-02 NOTE — Telephone Encounter (Signed)
Carvedilol Atorvastatin Valsartan  [facility] [address]

## 2017-11-06 ENCOUNTER — Other Ambulatory Visit: Payer: Self-pay

## 2017-11-06 ENCOUNTER — Ambulatory Visit (HOSPITAL_BASED_OUTPATIENT_CLINIC_OR_DEPARTMENT_OTHER): Payer: Managed Care, Other (non HMO) | Admitting: Physical Medicine & Rehabilitation

## 2017-11-06 ENCOUNTER — Encounter: Payer: Managed Care, Other (non HMO) | Attending: Physical Medicine & Rehabilitation

## 2017-11-06 VITALS — BP 160/97 | HR 86

## 2017-11-06 DIAGNOSIS — F329 Major depressive disorder, single episode, unspecified: Secondary | ICD-10-CM | POA: Insufficient documentation

## 2017-11-06 DIAGNOSIS — N3289 Other specified disorders of bladder: Secondary | ICD-10-CM | POA: Insufficient documentation

## 2017-11-06 DIAGNOSIS — I10 Essential (primary) hypertension: Secondary | ICD-10-CM | POA: Diagnosis not present

## 2017-11-06 DIAGNOSIS — Z96642 Presence of left artificial hip joint: Secondary | ICD-10-CM | POA: Diagnosis not present

## 2017-11-06 DIAGNOSIS — I69354 Hemiplegia and hemiparesis following cerebral infarction affecting left non-dominant side: Secondary | ICD-10-CM | POA: Insufficient documentation

## 2017-11-06 DIAGNOSIS — E119 Type 2 diabetes mellitus without complications: Secondary | ICD-10-CM | POA: Insufficient documentation

## 2017-11-06 DIAGNOSIS — E785 Hyperlipidemia, unspecified: Secondary | ICD-10-CM | POA: Insufficient documentation

## 2017-11-06 DIAGNOSIS — G8114 Spastic hemiplegia affecting left nondominant side: Secondary | ICD-10-CM

## 2017-11-06 DIAGNOSIS — R569 Unspecified convulsions: Secondary | ICD-10-CM | POA: Diagnosis not present

## 2017-11-06 DIAGNOSIS — Z7982 Long term (current) use of aspirin: Secondary | ICD-10-CM | POA: Diagnosis not present

## 2017-11-06 DIAGNOSIS — Z823 Family history of stroke: Secondary | ICD-10-CM | POA: Diagnosis not present

## 2017-11-06 DIAGNOSIS — I739 Peripheral vascular disease, unspecified: Secondary | ICD-10-CM | POA: Insufficient documentation

## 2017-11-06 DIAGNOSIS — Z85048 Personal history of other malignant neoplasm of rectum, rectosigmoid junction, and anus: Secondary | ICD-10-CM | POA: Insufficient documentation

## 2017-11-06 DIAGNOSIS — I69398 Other sequelae of cerebral infarction: Secondary | ICD-10-CM | POA: Insufficient documentation

## 2017-11-06 DIAGNOSIS — Z8249 Family history of ischemic heart disease and other diseases of the circulatory system: Secondary | ICD-10-CM | POA: Insufficient documentation

## 2017-11-06 DIAGNOSIS — Z79899 Other long term (current) drug therapy: Secondary | ICD-10-CM | POA: Insufficient documentation

## 2017-11-06 NOTE — Progress Notes (Signed)
Botox Injection for spasticity using needle EMG guidance  Dilution: 50 Units/ml Indication: Severe spasticity which interferes with ADL,mobility and/or  hygiene and is unresponsive to medication management and other conservative care Informed consent was obtained after describing risks and benefits of the procedure with the patient. This includes bleeding, bruising, infection, excessive weakness, or medication side effects. A REMS form is on file and signed. Needle: 27g 1" needle electrode Number of units per muscle FDS 50 FDP 50 FPL 50 FCR 25 FCU 25 All injections were done after obtaining appropriate EMG activity and after negative drawback for blood. The patient tolerated the procedure well. Post procedure instructions were given. A followup appointment was made.

## 2017-11-06 NOTE — Patient Instructions (Signed)

## 2017-11-16 ENCOUNTER — Telehealth: Payer: Self-pay

## 2017-11-16 MED ORDER — OXYBUTYNIN CHLORIDE 5 MG PO TABS: 5.0000 mg | ORAL_TABLET | Freq: Two times a day (BID) | ORAL | 2 refills | Status: DC

## 2017-11-16 NOTE — Telephone Encounter (Signed)
Patient called requesting refill, reordered per clinical protocol

## 2017-11-23 ENCOUNTER — Encounter: Payer: Self-pay | Admitting: Internal Medicine

## 2017-11-23 ENCOUNTER — Ambulatory Visit: Payer: Managed Care, Other (non HMO) | Attending: Internal Medicine | Admitting: Internal Medicine

## 2017-11-23 VITALS — BP 183/118 | HR 78 | Temp 99.3°F | Resp 16

## 2017-11-23 DIAGNOSIS — R569 Unspecified convulsions: Secondary | ICD-10-CM | POA: Insufficient documentation

## 2017-11-23 DIAGNOSIS — I10 Essential (primary) hypertension: Secondary | ICD-10-CM | POA: Diagnosis not present

## 2017-11-23 DIAGNOSIS — Z8673 Personal history of transient ischemic attack (TIA), and cerebral infarction without residual deficits: Secondary | ICD-10-CM | POA: Insufficient documentation

## 2017-11-23 DIAGNOSIS — G939 Disorder of brain, unspecified: Secondary | ICD-10-CM | POA: Insufficient documentation

## 2017-11-23 DIAGNOSIS — E119 Type 2 diabetes mellitus without complications: Secondary | ICD-10-CM | POA: Diagnosis not present

## 2017-11-23 DIAGNOSIS — I69359 Hemiplegia and hemiparesis following cerebral infarction affecting unspecified side: Secondary | ICD-10-CM

## 2017-11-23 DIAGNOSIS — F959 Tic disorder, unspecified: Secondary | ICD-10-CM | POA: Insufficient documentation

## 2017-11-23 DIAGNOSIS — Z760 Encounter for issue of repeat prescription: Secondary | ICD-10-CM | POA: Insufficient documentation

## 2017-11-23 DIAGNOSIS — G9389 Other specified disorders of brain: Secondary | ICD-10-CM

## 2017-11-23 DIAGNOSIS — Z833 Family history of diabetes mellitus: Secondary | ICD-10-CM | POA: Insufficient documentation

## 2017-11-23 DIAGNOSIS — N319 Neuromuscular dysfunction of bladder, unspecified: Secondary | ICD-10-CM | POA: Insufficient documentation

## 2017-11-23 DIAGNOSIS — Z8249 Family history of ischemic heart disease and other diseases of the circulatory system: Secondary | ICD-10-CM | POA: Insufficient documentation

## 2017-11-23 DIAGNOSIS — G8194 Hemiplegia, unspecified affecting left nondominant side: Secondary | ICD-10-CM | POA: Insufficient documentation

## 2017-11-23 DIAGNOSIS — Z79899 Other long term (current) drug therapy: Secondary | ICD-10-CM | POA: Insufficient documentation

## 2017-11-23 DIAGNOSIS — C20 Malignant neoplasm of rectum: Secondary | ICD-10-CM | POA: Insufficient documentation

## 2017-11-23 DIAGNOSIS — R471 Dysarthria and anarthria: Secondary | ICD-10-CM | POA: Insufficient documentation

## 2017-11-23 DIAGNOSIS — Z7982 Long term (current) use of aspirin: Secondary | ICD-10-CM | POA: Insufficient documentation

## 2017-11-23 LAB — GLUCOSE, POCT (MANUAL RESULT ENTRY): POC Glucose: 135 mg/dl — AB (ref 70–99)

## 2017-11-23 LAB — POCT GLYCOSYLATED HEMOGLOBIN (HGB A1C): HbA1c, POC (prediabetic range): 6.3 % (ref 5.7–6.4)

## 2017-11-23 MED ORDER — VALSARTAN 160 MG PO TABS: 160.0000 mg | ORAL_TABLET | Freq: Every day | ORAL | 1 refills | Status: DC

## 2017-11-23 MED ORDER — ATORVASTATIN CALCIUM 80 MG PO TABS: ORAL_TABLET | ORAL | 1 refills | Status: DC

## 2017-11-23 MED ORDER — CARVEDILOL 25 MG PO TABS: ORAL_TABLET | ORAL | 11 refills | Status: DC

## 2017-11-23 MED ORDER — AMLODIPINE BESYLATE 10 MG PO TABS: 10.0000 mg | ORAL_TABLET | Freq: Every day | ORAL | 11 refills | Status: DC

## 2017-11-23 NOTE — Progress Notes (Signed)
Patient ID: [name], male    DOB: [birth year]  MRN: [id number]  CC: Medication Refill   Subjective: [name] is a 52 y.o. male who presents for chronic ds management.  His wife, [name], is with him.  Patient was last seen in March of this year. His concerns today include:  Pt with hx of HTN, DM, rectal CA, chronic Ca+ brain mass, sz, brain stem CVA 11/2016 with residual LT spastic hemiparesis and dysarthria  HTN: Reports compliance with his medications but has been out of amlodipine for the past 6 months.  He and his spouse did not recall this medication.   No device to check blood pressure.  He limit salt in the foods.  Denies any chest pains or shortness of breath.  No lower extremity edema.  Rectal Cancer: Last colonoscopy was in 2009.  He has not had any blood in the stools.  History of CVA: He gets around in a wheelchair.  Getting botoxin inj LUE from Dr. Letta Pate. Wife has noticed that he has a mild tremor of the head since March of this year and tremor in the right hand since June of this year. He has history of RT hemisphere calcified brain mass that was being followed by Dr. Christella Noa at [facility].  He was last seen in 2017.  Last brain MRI was December 2018  DM: This is diet controlled.  He does not check blood sugars.  Reports having an eye exam this yr Dr. Wynetta Emery at [facility]  Patient Active Problem List   Diagnosis Date Noted  . Subacromial impingement of left shoulder 06/09/2017  . Spastic neurogenic bladder 06/09/2017  . Dysarthria as late effect of stroke 04/01/2017  . Neurogenic bladder due to old stroke 12/25/2016  . Right pontine cerebrovascular accident ([place]) 12/18/2016  . Left hemiparesis ([place])   . Diastolic dysfunction   . Dysphagia, post-stroke   . Acute ischemic stroke ([place]) 12/12/2016  . Seizures ([place]) 07/22/2014  . Hypertension 07/22/2014  . Type 2 diabetes mellitus with vascular disease ([place])  07/22/2014  . Brain mass   . Malignant neoplasm of rectum ([place]) 09/02/2007     Current Outpatient Medications on File Prior to Visit  Medication Sig Dispense Refill  . aspirin 325 MG tablet Take 1 tablet (325 mg total) by mouth daily. 30 tablet 0  . FLUoxetine (PROZAC) 10 MG capsule Take 1 capsule (10 mg total) by mouth daily. 30 capsule 0  . Melatonin 3 MG TABS Take 3 tablets (9 mg total) by mouth at bedtime. 30 tablet 0  . oxybutynin (DITROPAN) 5 MG tablet Take 1 tablet (5 mg total) by mouth 2 (two) times daily. 60 tablet 2   No current facility-administered medications on file prior to visit.     No Known Allergies  Social History   Socioeconomic History  . Marital status: Married    Spouse name: [name]  . Number of children: 1  . Years of education: 22  . Highest education level: Not on file  Occupational History  . Occupation: Group Home  Social Needs  . Financial resource strain: Not on file  . Food insecurity:    Worry: Not on file    Inability: Not on file  . Transportation needs:    Medical: Not on file    Non-medical: Not on file  Tobacco Use  . Smoking status: Never Smoker  . Smokeless tobacco: Never Used  Substance and Sexual Activity  .  Alcohol use: No    Alcohol/week: 0.0 standard drinks    Frequency: Never    Comment: socially  . Drug use: No  . Sexual activity: Not on file  Lifestyle  . Physical activity:    Days per week: Not on file    Minutes per session: Not on file  . Stress: Not on file  Relationships  . Social connections:    Talks on phone: Not on file    Gets together: Not on file    Attends religious service: Not on file    Active member of club or organization: Not on file    Attends meetings of clubs or organizations: Not on file    Relationship status: Not on file  . Intimate partner violence:    Fear of current or ex partner: Not on file    Emotionally abused: Not on file    Physically abused: Not on file    Forced sexual  activity: Not on file  Other Topics Concern  . Not on file  Social History Narrative   Patient drinks caffeine a couple times a week.   Patient is right handed.   Lives with wife [name]) and daughter    Family History  Problem Relation Age of Onset  . Hypertension Mother   . Heart disease Mother   . Hyperlipidemia Mother   . Heart failure Mother   . Hypertension Sister   . Hypertension Brother   . Stroke Father   . Hypertension Father   . Colon cancer Maternal Grandmother   . Diabetes Maternal Grandmother     Past Surgical History:  Procedure Laterality Date  . APPENDECTOMY    . JOINT REPLACEMENT Left 2004   Left hip  . TOTAL HIP ARTHROPLASTY      ROS: Review of Systems Negative except as stated above. PHYSICAL EXAM: BP (!) 183/118   Pulse 78   Temp 99.3 F (37.4 C) (Oral)   Resp 16   SpO2 98%   Physical Exam  General appearance - alert, well appearing, and in no distress Mental status - normal mood, behavior Eyes - pupils equal and reactive, extraocular eye movements intact Mouth - mucous membranes moist, pharynx normal without lesions Neck - supple, no significant adenopathy Chest - clear to auscultation, no wheezes, rales or rhonchi, symmetric air entry Heart - normal rate, regular rhythm, normal S1, S2, no murmurs, rubs, clicks or gallops Neurological -patient is in a manual wheelchair.  He is noted to have fine tremor of the head and the right hand. Extremities -no lower extremity edema.  Results for orders placed or performed in visit on 11/23/17  CBC  Result Value Ref Range   WBC 4.6 3.4 - 10.8 x10E3/uL   RBC 4.91 4.14 - 5.80 x10E6/uL   Hemoglobin 13.0 13.0 - 17.7 g/dL   Hematocrit 37.4 (L) 37.5 - 51.0 %   MCV 76 (L) 79 - 97 fL   MCH 26.5 (L) 26.6 - 33.0 pg   MCHC 34.8 31.5 - 35.7 g/dL   RDW 14.8 12.3 - 15.4 %   Platelets 235 150 - 450 x10E3/uL  Comprehensive metabolic panel  Result Value Ref Range   Glucose 113 (H) 65 - 99 mg/dL   BUN 13  6 - 24 mg/dL   Creatinine, Ser 1.06 0.76 - 1.27 mg/dL   GFR calc non Af Amer 81 >59 mL/min/1.73   GFR calc Af Amer 93 >59 mL/min/1.73   BUN/Creatinine Ratio 12 9 - 20  Sodium 144 134 - 144 mmol/L   Potassium 4.0 3.5 - 5.2 mmol/L   Chloride 105 96 - 106 mmol/L   CO2 25 20 - 29 mmol/L   Calcium 9.5 8.7 - 10.2 mg/dL   Total Protein 6.9 6.0 - 8.5 g/dL   Albumin 4.3 3.5 - 5.5 g/dL   Globulin, Total 2.6 1.5 - 4.5 g/dL   Albumin/Globulin Ratio 1.7 1.2 - 2.2   Bilirubin Total 0.5 0.0 - 1.2 mg/dL   Alkaline Phosphatase 100 39 - 117 IU/L   AST 12 0 - 40 IU/L   ALT 19 0 - 44 IU/L  Lipid panel  Result Value Ref Range   Cholesterol, Total 125 100 - 199 mg/dL   Triglycerides 111 0 - 149 mg/dL   HDL 38 (L) >39 mg/dL   VLDL Cholesterol Cal 22 5 - 40 mg/dL   LDL Calculated 65 0 - 99 mg/dL   Chol/HDL Ratio 3.3 0.0 - 5.0 ratio  POCT glucose (manual entry)  Result Value Ref Range   POC Glucose 135 (A) 70 - 99 mg/dl  POCT glycosylated hemoglobin (Hb A1C)  Result Value Ref Range   Hemoglobin A1C     HbA1c POC (<> result, manual entry)     HbA1c, POC (prediabetic range) 6.3 5.7 - 6.4 %   HbA1c, POC (controlled diabetic range)      ASSESSMENT AND PLAN: 1. Essential hypertension Not at goal.  Refill given on all blood pressure medications including amlodipine.  DASH diet discussed.  Patient to follow-up with clinical pharmacist in 1 to 2 weeks for repeat blood pressure check. - CBC - Comprehensive metabolic panel - Lipid panel - valsartan (DIOVAN) 160 MG tablet; Take 1 tablet (160 mg total) by mouth daily.  Dispense: 90 tablet; Refill: 1 - amLODipine (NORVASC) 10 MG tablet; Take 1 tablet (10 mg total) by mouth daily.  Dispense: 30 tablet; Refill: 11 - carvedilol (COREG) 25 MG tablet; TAKE 1 TABLET BY MOUTH TWICE A DAY WITH A MEAL  Dispense: 60 tablet; Refill: 11  2. Diabetes mellitus type 2, diet-controlled (HCC) A1c is below 7.  Encourage him to continue healthy eating habits. - POCT  glucose (manual entry) - POCT glycosylated hemoglobin (Hb A1C)  3. Rectal cancer [facility]) - Ambulatory referral to Gastroenterology  4. Tic - Ambulatory referral to Neurology  5. Brain mass - MR Brain Wo Contrast; Future - Ambulatory referral to Neurosurgery  6. CVA, old, hemiparesis ([place]) - atorvastatin (LIPITOR) 80 MG tablet; TAKE 1 TABLET BY MOUTH EVERY DAY AT 6PM  Dispense: 90 tablet; Refill: 1   Patient was given the opportunity to ask questions.  Patient verbalized understanding of the plan and was able to repeat key elements of the plan.   Orders Placed This Encounter  Procedures  . MR Brain Wo Contrast  . CBC  . Comprehensive metabolic panel  . Lipid panel  . Ambulatory referral to Gastroenterology  . Ambulatory referral to Neurosurgery  . Ambulatory referral to Neurology  . POCT glucose (manual entry)  . POCT glycosylated hemoglobin (Hb A1C)     Requested Prescriptions   Signed Prescriptions Disp Refills  . valsartan (DIOVAN) 160 MG tablet 90 tablet 1    Sig: Take 1 tablet (160 mg total) by mouth daily.  Marland Kitchen amLODipine (NORVASC) 10 MG tablet 30 tablet 11    Sig: Take 1 tablet (10 mg total) by mouth daily.  . carvedilol (COREG) 25 MG tablet 60 tablet 11    Sig: TAKE 1  TABLET BY MOUTH TWICE A DAY WITH A MEAL  . atorvastatin (LIPITOR) 80 MG tablet 90 tablet 1    Sig: TAKE 1 TABLET BY MOUTH EVERY DAY AT 6PM    Return in about 2 months (around 01/23/2018).  Karle Plumber, MD, FACP

## 2017-11-23 NOTE — Patient Instructions (Signed)
Please give patient an appointment with the clinical pharmacist in 1 to 2 weeks for repeat blood pressure check.

## 2017-11-23 NOTE — Progress Notes (Signed)
[id number] a1c-6.3

## 2017-11-24 LAB — COMPREHENSIVE METABOLIC PANEL
ALT: 19 IU/L (ref 0–44)
AST: 12 IU/L (ref 0–40)
Albumin/Globulin Ratio: 1.7 (ref 1.2–2.2)
Albumin: 4.3 g/dL (ref 3.5–5.5)
Alkaline Phosphatase: 100 IU/L (ref 39–117)
BUN/Creatinine Ratio: 12 (ref 9–20)
BUN: 13 mg/dL (ref 6–24)
Bilirubin Total: 0.5 mg/dL (ref 0.0–1.2)
CO2: 25 mmol/L (ref 20–29)
CREATININE: 1.06 mg/dL (ref 0.76–1.27)
Calcium: 9.5 mg/dL (ref 8.7–10.2)
Chloride: 105 mmol/L (ref 96–106)
GFR calc non Af Amer: 81 mL/min/{1.73_m2} (ref >59)
GFR, EST AFRICAN AMERICAN: 93 mL/min/{1.73_m2} (ref >59)
GLUCOSE: 113 mg/dL — AB (ref 65–99)
Globulin, Total: 2.6 g/dL (ref 1.5–4.5)
Potassium: 4 mmol/L (ref 3.5–5.2)
Sodium: 144 mmol/L (ref 134–144)
TOTAL PROTEIN: 6.9 g/dL (ref 6.0–8.5)

## 2017-11-24 LAB — LIPID PANEL
CHOL/HDL RATIO: 3.3 ratio (ref 0.0–5.0)
Cholesterol, Total: 125 mg/dL (ref 100–199)
HDL: 38 mg/dL — AB (ref >39)
LDL Calculated: 65 mg/dL (ref 0–99)
Triglycerides: 111 mg/dL (ref 0–149)
VLDL CHOLESTEROL CAL: 22 mg/dL (ref 5–40)

## 2017-11-24 LAB — CBC
Hematocrit: 37.4 % — ABNORMAL LOW (ref 37.5–51.0)
Hemoglobin: 13 g/dL (ref 13.0–17.7)
MCH: 26.5 pg — ABNORMAL LOW (ref 26.6–33.0)
MCHC: 34.8 g/dL (ref 31.5–35.7)
MCV: 76 fL — ABNORMAL LOW (ref 79–97)
PLATELETS: 235 10*3/uL (ref 150–450)
RBC: 4.91 x10E6/uL (ref 4.14–5.80)
RDW: 14.8 % (ref 12.3–15.4)
WBC: 4.6 10*3/uL (ref 3.4–10.8)

## 2017-11-25 ENCOUNTER — Telehealth: Payer: Self-pay

## 2017-11-25 NOTE — Telephone Encounter (Signed)
Contacted the scheduling department to schedule the MRI of brain  Spoke with Vickii Chafe   MRI is scheduled for December 02, 2017 @ 5pm @ [facility]. Pt is to arrive @430pm  first floor radiology    Contacted pt wife to give appointment information she was in the process of driving so she asked that the appointment information be emailed to her.  I emailed pt wife appointment information

## 2017-11-27 ENCOUNTER — Telehealth: Payer: Self-pay | Admitting: Internal Medicine

## 2017-11-27 NOTE — Telephone Encounter (Signed)
Melanie with pre-cert says that there needs to be pre cert by [employer] due to the patients insurance Psychologist, counselling). Please follow up with melanie.

## 2017-11-30 ENCOUNTER — Telehealth: Payer: Self-pay | Admitting: Internal Medicine

## 2017-11-30 DIAGNOSIS — G9389 Other specified disorders of brain: Secondary | ICD-10-CM

## 2017-11-30 NOTE — Telephone Encounter (Signed)
Spoke with Carolyne Fiscal and schedule the MRI bain w and wo contrast. Per Carolyne Fiscal the procedure is the same date and time

## 2017-11-30 NOTE — Telephone Encounter (Signed)
Melanie with pre-cert called to check on the pre-cert for the MRI for the brain without contrast. Please follow up.

## 2017-11-30 NOTE — Telephone Encounter (Signed)
-----   Message from Jackelyn Knife, [place] sent at 11/30/2017 11:10 AM EST ----- Regarding: order Dr. Wynetta Emery this is the reminder to change order to MRI brain w and wo contrast

## 2017-11-30 NOTE — Telephone Encounter (Signed)
Contacted [employer] to start prior auth for MRI Brain wo contrast. Spoke with Pau D and intake rep with [place]. Per Cecille Amsterdam she has to transfer me to [employer] because of pt insurance.   Transferred to [employer] and spoke to [place]. intake representative    [phone] MRI Brain wo Contrast ICD -G93.89 Brain Mass    Case #[id number]   Gave all clinical information and there were additional clinical information needed    Asked to speak with the clinical reviewer and spoke with Francene Boyers and per Tamela Oddi I will need to be to transferred to an oncologist clinical reviewer  I spoke with Trini P and went over clinical information. Per Trini the case will need to be reviewed because they usually do the MRI Brain w and wo contrast. So she wanted me to double check with Dr. Wynetta Emery to see if she wants to change it or keep it without  Spoke with Dr. Wynetta Emery and she has switched it to MRI Brain w and wo contrast  Spoke with Trini and informed her of Dr. Wynetta Emery response and per Leda Roys the request has been approved.  New CPT code for procedure is [phone] MRI Brain w/wo contrast  Per Trini because of pt healthcare plan they will contact pt to give authorization information to they will fax a confirmation to the office

## 2017-12-01 ENCOUNTER — Telehealth: Payer: Self-pay | Admitting: Internal Medicine

## 2017-12-01 NOTE — Telephone Encounter (Signed)
Per Dr. Wynetta Emery call and push MRI out for 2 weeks.   Contacted the scheduling department and spoke to [facility]   We have pushed MRI out till December 16, 2017 @4pm  pt is to arrive @330pm  @[facility]   Will contact pt and make aware

## 2017-12-01 NOTE — Telephone Encounter (Signed)
I called and spoke with patient's insurance [employer] today and attempt to get approval for the MRI of his head.  I was told that it was denied because he has an hemangioma and the follow-up schedule that is recommended in terms of imaging is 3 months, 6 months and 12 months after the tumor was found or once a year for up to 5 years.  The imaging tests that they receive is the MRI that was done in 2016.  I told her that patient subsequently had another imaging December 1 of 2018 that showed a slight increase in the mass.  She requests that I send this on to them with his case number on the document and put attention: For reconsideration.  I will have my [place] fax this off today to fax [phone]

## 2017-12-01 NOTE — Telephone Encounter (Signed)
MRI Bain w wo contrast was denied. Printed out the reason why it was denied and will give to Dr. Wynetta Emery to see if she is wanting to do peer to peer

## 2017-12-01 NOTE — Telephone Encounter (Signed)
[name] with pre-cert called because the MRI of the brain with and without contrast pre-cert was denied by the insurance. Threasa Beards wanted to know if a peer-to-peer would be done or if the order was going to be canceled. Please follow up.

## 2017-12-01 NOTE — Telephone Encounter (Signed)
Checked the [employer] and is just aware of MRI being denied. Will give Dr. Wynetta Emery the printout of why MRI was denied and will see if Dr. Wynetta Emery is going to do the peer to peer. Once I find out what Dr. Wynetta Emery is going to do will call Melanie back

## 2017-12-01 NOTE — Telephone Encounter (Signed)
Contacted pt and made aware of appointment change and if he has any questions or concerns

## 2017-12-02 ENCOUNTER — Ambulatory Visit (HOSPITAL_COMMUNITY): Payer: Managed Care, Other (non HMO)

## 2017-12-07 ENCOUNTER — Encounter: Payer: Self-pay | Admitting: Pharmacist

## 2017-12-08 ENCOUNTER — Ambulatory Visit: Payer: Managed Care, Other (non HMO) | Attending: Internal Medicine | Admitting: Pharmacist

## 2017-12-08 ENCOUNTER — Encounter: Payer: Self-pay | Admitting: Pharmacist

## 2017-12-08 VITALS — BP 104/67 | HR 85

## 2017-12-08 DIAGNOSIS — Z8249 Family history of ischemic heart disease and other diseases of the circulatory system: Secondary | ICD-10-CM | POA: Insufficient documentation

## 2017-12-08 DIAGNOSIS — I10 Essential (primary) hypertension: Secondary | ICD-10-CM | POA: Insufficient documentation

## 2017-12-08 DIAGNOSIS — Z79899 Other long term (current) drug therapy: Secondary | ICD-10-CM | POA: Insufficient documentation

## 2017-12-08 DIAGNOSIS — Z823 Family history of stroke: Secondary | ICD-10-CM | POA: Insufficient documentation

## 2017-12-08 NOTE — Progress Notes (Signed)
   S:    PCP: Dr. Wynetta Emery  Patient arrives in good spirits. Presents to the clinic for hypertension management. Patient was referred by Dr. Wynetta Emery on 11/23/17. BP elevated d/t being without medications prior to that visit. Refills were given and patient was instructed to follow-up with me.   Today, pt denies chest pain, shortness of breath, headache, or blurred vision.   Patient reports adherence with medications.  Current BP Medications include:  Valsartan 160 mg daily, amlodipine 10 mg daily, and carvedilol 25 mg BID  Antihypertensives tried in the past include: HCTZ, irbesartan  Dietary habits include: limits salt, drinks little to no caffeine Exercise habits include: physical mobility is  limited Family / Social history: HTN (mother, father, sister, and brother), heart disease (mother), heart failure (mother), stroke (father); denies alcohol; never smoker  ASCVD risk factors include: stroke hx, HTN, family hx  Home BP readings: does not take at home  O:  L arm after 5 minutes rest 104/67, HR 85  Last 3 Office BP readings: BP Readings from Last 3 Encounters:  11/23/17 (!) 183/118  11/06/17 (!) 160/97  11/02/17 (!) 176/119   BMET    Component Value Date/Time   NA 144 11/23/2017 1706   K 4.0 11/23/2017 1706   CL 105 11/23/2017 1706   CO2 25 11/23/2017 1706   GLUCOSE 113 (H) 11/23/2017 1706   GLUCOSE 115 (H) 01/01/2017 0624   BUN 13 11/23/2017 1706   CREATININE 1.06 11/23/2017 1706   CALCIUM 9.5 11/23/2017 1706   GFRNONAA 81 11/23/2017 1706   GFRAA 93 11/23/2017 1706   Renal function: CrCl cannot be calculated (Unknown ideal weight.).  A/P: Hypertension longstanding diagnosed currently controlled on current medications. BP Goal <130/80 mmHg. Patient is adherent with current medications.  -Continued anti-hypertensives.  -Counseled on lifestyle modifications for blood pressure control including reduced dietary sodium, increased exercise, adequate sleep -HM: Tetanus  indicated; pt refused today  Results reviewed and written information provided. Total time in face-to-face counseling 15 minutes.   F/U Clinic Visitw/ PCP 01/26/2018.    Patient seen with:  Dixon Boos, PharmD Candidate [employer] 2021  Benard Halsted, PharmD, [place] [phone]

## 2017-12-08 NOTE — Telephone Encounter (Signed)
Contacted [employer] to check on the status of the MRI. Spoke to [employer]. an clinical representative regarding MRI. From her understanding she hasn't received anything regarding pt. Gave clinical information and refaxed MRI report from 12/2016.

## 2017-12-08 NOTE — Patient Instructions (Signed)

## 2017-12-09 NOTE — Telephone Encounter (Signed)
Checked on the prior auth for MRI after submitting additional information the case is still in review

## 2017-12-14 NOTE — Telephone Encounter (Signed)
Went on the [employer] to check on status of prior auth  Authorization # [id number]   Effective- 11/30/2017 Expires- 02/28/2018   Contacted the scheduling department and spoke with Aniceto Boss and gave auth #

## 2017-12-16 ENCOUNTER — Ambulatory Visit (HOSPITAL_COMMUNITY): Admission: RE | Admit: 2017-12-16 | Payer: Managed Care, Other (non HMO) | Source: Ambulatory Visit

## 2017-12-18 ENCOUNTER — Encounter: Payer: Self-pay | Admitting: Physical Medicine & Rehabilitation

## 2017-12-18 ENCOUNTER — Ambulatory Visit: Payer: Managed Care, Other (non HMO) | Admitting: Physical Medicine & Rehabilitation

## 2017-12-18 ENCOUNTER — Encounter: Payer: Managed Care, Other (non HMO) | Attending: Physical Medicine & Rehabilitation

## 2017-12-18 VITALS — BP 127/84 | HR 71 | Ht 63.0 in | Wt 215.0 lb

## 2017-12-18 DIAGNOSIS — Z823 Family history of stroke: Secondary | ICD-10-CM | POA: Diagnosis not present

## 2017-12-18 DIAGNOSIS — I69354 Hemiplegia and hemiparesis following cerebral infarction affecting left non-dominant side: Secondary | ICD-10-CM | POA: Insufficient documentation

## 2017-12-18 DIAGNOSIS — I739 Peripheral vascular disease, unspecified: Secondary | ICD-10-CM | POA: Insufficient documentation

## 2017-12-18 DIAGNOSIS — G8114 Spastic hemiplegia affecting left nondominant side: Secondary | ICD-10-CM | POA: Diagnosis not present

## 2017-12-18 DIAGNOSIS — F329 Major depressive disorder, single episode, unspecified: Secondary | ICD-10-CM | POA: Insufficient documentation

## 2017-12-18 DIAGNOSIS — Z85048 Personal history of other malignant neoplasm of rectum, rectosigmoid junction, and anus: Secondary | ICD-10-CM | POA: Insufficient documentation

## 2017-12-18 DIAGNOSIS — R569 Unspecified convulsions: Secondary | ICD-10-CM | POA: Insufficient documentation

## 2017-12-18 DIAGNOSIS — E119 Type 2 diabetes mellitus without complications: Secondary | ICD-10-CM | POA: Insufficient documentation

## 2017-12-18 DIAGNOSIS — N3289 Other specified disorders of bladder: Secondary | ICD-10-CM | POA: Insufficient documentation

## 2017-12-18 DIAGNOSIS — Z8249 Family history of ischemic heart disease and other diseases of the circulatory system: Secondary | ICD-10-CM | POA: Diagnosis not present

## 2017-12-18 DIAGNOSIS — Z79899 Other long term (current) drug therapy: Secondary | ICD-10-CM | POA: Diagnosis not present

## 2017-12-18 DIAGNOSIS — Z96642 Presence of left artificial hip joint: Secondary | ICD-10-CM | POA: Insufficient documentation

## 2017-12-18 DIAGNOSIS — I6350 Cerebral infarction due to unspecified occlusion or stenosis of unspecified cerebral artery: Secondary | ICD-10-CM | POA: Diagnosis not present

## 2017-12-18 DIAGNOSIS — I69322 Dysarthria following cerebral infarction: Secondary | ICD-10-CM

## 2017-12-18 DIAGNOSIS — I10 Essential (primary) hypertension: Secondary | ICD-10-CM | POA: Insufficient documentation

## 2017-12-18 DIAGNOSIS — I69398 Other sequelae of cerebral infarction: Secondary | ICD-10-CM | POA: Insufficient documentation

## 2017-12-18 DIAGNOSIS — Z7982 Long term (current) use of aspirin: Secondary | ICD-10-CM | POA: Diagnosis not present

## 2017-12-18 DIAGNOSIS — E785 Hyperlipidemia, unspecified: Secondary | ICD-10-CM | POA: Insufficient documentation

## 2017-12-18 DIAGNOSIS — N318 Other neuromuscular dysfunction of bladder: Secondary | ICD-10-CM

## 2017-12-18 DIAGNOSIS — N319 Neuromuscular dysfunction of bladder, unspecified: Secondary | ICD-10-CM

## 2017-12-18 NOTE — Patient Instructions (Signed)
Will adjust Botox dose next visit

## 2017-12-18 NOTE — Progress Notes (Signed)
Subjective:    Patient ID: [name], male    DOB: [birth year], 52 y.o.   MRN: [id number] 52 year old right-handed male with history of hypertension, diabetes mellitus, and chronic calcified brain mass likely meningioma,   Wife works during the day.  Presented on 12/12/2016 with left-sided weakness.  Noted blood pressure 240/140.  The patient did report he ran out of his medications a few months ago with financial constraints.  Cranial CT scan unremarkable.  Stable right medial frontal region calcified mass, vasogenic edema right frontal lobe.  The patient did not receive tPA.  CT angiogram of the head and neck negative for large proximal arterial branch occlusion.  MRI showed acute right paramedian brainstem infarct affecting lower pons and upper medulla. Echocardiogram with ejection fraction of 32%, grade 1 diastolic dysfunction, no emboli.  Maintained on aspirin for CVA prophylaxis. Subcutaneous Lovenox for DVT prophylaxis.  Diet slowly advanced. DATE OF ADMISSION:  12/18/2016 DATE OF DISCHARGE:  01/08/2017 HPI  52 year old male with history of right pontine infarct causing left spastic hemiplegia and dysarthria.  He has had persistent problems with tone.  They have not responded to physical therapy or to oral medications.  He did also have some problems with sedation from oral medications. In addition to his spasticity and weakness, he has had neurogenic spastic bladder due to his CVA but has responded well to anticholinergic medications.  We are prescribing oxybutynin 5 mg twice daily FDS 50 FDP 50 FPL 50 FCR 25 FCU 25 Pain Inventory Average Pain 0 Pain Right Now 0 My pain is na  In the last 24 hours, has pain interfered with the following? General activity 0 Relation with others 0 Enjoyment of life 0 What TIME of day is your pain at its worst? na Sleep (in general) Fair  Pain is worse with: na Pain improves with: na Relief from Meds: na  Mobility do you  drive?  no use a wheelchair needs help with transfers  Function disabled: date disabled .  Neuro/Psych weakness numbness tingling trouble walking  Prior Studies Any changes since last visit?  no  Physicians involved in your care Any changes since last visit?  no   Family History  Problem Relation Age of Onset  . Hypertension Mother   . Heart disease Mother   . Hyperlipidemia Mother   . Heart failure Mother   . Hypertension Sister   . Hypertension Brother   . Stroke Father   . Hypertension Father   . Colon cancer Maternal Grandmother   . Diabetes Maternal Grandmother    Social History   Socioeconomic History  . Marital status: Married    Spouse name: [name]  . Number of children: 1  . Years of education: 22  . Highest education level: Not on file  Occupational History  . Occupation: Group Home  Social Needs  . Financial resource strain: Not on file  . Food insecurity:    Worry: Not on file    Inability: Not on file  . Transportation needs:    Medical: Not on file    Non-medical: Not on file  Tobacco Use  . Smoking status: Never Smoker  . Smokeless tobacco: Never Used  Substance and Sexual Activity  . Alcohol use: Yes    Frequency: Never    Comment: socially  . Drug use: No  . Sexual activity: Not on file  Lifestyle  . Physical activity:    Days per week: Not on file    Minutes per  session: Not on file  . Stress: Not on file  Relationships  . Social connections:    Talks on phone: Not on file    Gets together: Not on file    Attends religious service: Not on file    Active member of club or organization: Not on file    Attends meetings of clubs or organizations: Not on file    Relationship status: Not on file  Other Topics Concern  . Not on file  Social History Narrative   Patient drinks caffeine a couple times a week.   Patient is right handed.   Lives with wife [name]) and daughter   Past Surgical History:  Procedure Laterality  Date  . APPENDECTOMY    . JOINT REPLACEMENT Left 2004   Left hip  . TOTAL HIP ARTHROPLASTY     Past Medical History:  Diagnosis Date  . Colon cancer ([place])    rectal  . Diabetes mellitus without complication ([place])   . Hypertension   . Seizure ([place])    BP 127/84   Pulse 71   Ht 5\' 3"  (1.6 m)   Wt 215 lb (97.5 kg)   SpO2 95%   BMI 38.09 kg/m   Opioid Risk Score:   Fall Risk Score:  `1  Depression screen PHQ 2/9  Depression screen [facility] 2/9 09/28/2017 06/30/2017 05/11/2017 03/31/2017 02/05/2017  Decreased Interest 0 0 0 0 0  Down, Depressed, Hopeless 0 0 0 0 0  PHQ - 2 Score 0 0 0 0 0     Review of Systems  Constitutional: Negative.   HENT: Negative.   Eyes: Negative.   Respiratory: Negative.   Cardiovascular: Negative.   Gastrointestinal: Negative.   Endocrine: Negative.   Genitourinary: Negative.   Musculoskeletal: Positive for gait problem.  Skin: Negative.   Allergic/Immunologic: Negative.   Neurological: Positive for weakness and numbness.  Hematological: Negative.   Psychiatric/Behavioral: Negative.   All other systems reviewed and are negative.      Objective:   Physical Exam  Constitutional: He is oriented to person, place, and time. He appears well-developed and well-nourished. No distress.  HENT:  Head: Normocephalic and atraumatic.  Eyes: Pupils are equal, round, and reactive to light. EOM are normal.  Neurological: He is alert and oriented to person, place, and time.  Skin: He is not diaphoretic.  Psychiatric: He has a normal mood and affect.  Nursing note and vitals reviewed.   Mood and affect flat but otherwise appropriate Speech dysarthric but has 80 to 90% intelligibility     tone  LUE MAS 3 pronators MAS 3 FDP, FDS to index and middle finger MAS 0 thumb flexor Motor strength is 2- at the left deltoid bicep tricep trace finger flexion 0 finger extension 0 wrist extension, does have 2/5 thumb extension and flexion    Assessment & Plan:  1.   Right pontomedullary infarct with chronic left spastic hemiplegia has plateaued from a physical functioning standpoint at 1 year post stroke.  He has had continued problems with spasticity which have been fairly well managed with botulinum toxin.  Left second and third digit did not extend respond as well as fourth and fifth digits and may need to be specifically targeted. Pronators also appear spastic and have not been injected  FDS 50- estim to 2nd and 3rd digit FDP 50 estim to 2nd and 3rd digit  FPL 50- good same FCR 25- increase to 50U FCU 25- increase to 50U Pronator teres 50U  Repeat onabotulinum toxin injection in 6 weeks  2.  Neurogenic spastic bladder responds to ditropan 5 mg BID

## 2018-01-11 ENCOUNTER — Telehealth: Payer: Self-pay

## 2018-01-11 MED ORDER — LOSARTAN POTASSIUM 50 MG PO TABS: 50.0000 mg | ORAL_TABLET | Freq: Every day | ORAL | 6 refills | Status: DC

## 2018-01-11 NOTE — Telephone Encounter (Signed)
Pharmacy is requesting an alternative to Valsartan due to continued national shortage. They confirmed with me that they do have all strengths of plain Losartan in stock at the moment. Please advise on alternative.

## 2018-01-26 ENCOUNTER — Encounter: Payer: Self-pay | Admitting: Internal Medicine

## 2018-01-26 ENCOUNTER — Ambulatory Visit: Payer: BLUE CROSS/BLUE SHIELD | Attending: Internal Medicine | Admitting: Internal Medicine

## 2018-01-26 VITALS — BP 116/77 | HR 75 | Temp 98.6°F | Resp 16

## 2018-01-26 DIAGNOSIS — G8114 Spastic hemiplegia affecting left nondominant side: Secondary | ICD-10-CM

## 2018-01-26 DIAGNOSIS — E1159 Type 2 diabetes mellitus with other circulatory complications: Secondary | ICD-10-CM

## 2018-01-26 DIAGNOSIS — E119 Type 2 diabetes mellitus without complications: Secondary | ICD-10-CM | POA: Diagnosis not present

## 2018-01-26 DIAGNOSIS — I739 Peripheral vascular disease, unspecified: Secondary | ICD-10-CM | POA: Diagnosis not present

## 2018-01-26 DIAGNOSIS — C20 Malignant neoplasm of rectum: Secondary | ICD-10-CM

## 2018-01-26 LAB — GLUCOSE, POCT (MANUAL RESULT ENTRY): POC Glucose: 145 mg/dl — AB (ref 70–99)

## 2018-01-26 NOTE — Patient Instructions (Signed)
Please call [facility] business office at [phone] to schedule your colonoscopy.  I referred you for this on last visit but I received a note from them stating that you need to call them first before they can schedule you.  You missed your appointment for the MRI of your head on December 4.  I will have my CMA try to reschedule this.

## 2018-01-26 NOTE — Progress Notes (Signed)
Patient ID: [name], male    DOB: 10/23/1965  MRN: [id number]  CC: Hypertension and Diabetes   Subjective: [name] is a 53 y.o. male who presents for chronic ds management.  Pt is alone today. His concerns today include:  [name], DM, rectal CA, chronic Ca+ brain mass, sz, brain stem CVA 11/2018with residualLT spastic hemiparesisand dysarthria, TICs  HYPERTENSION Currently taking: see medication list Med Adherence: [x]  Yes    []  No Medication side effects: []  Yes    [x]  No Adherence with salt restriction: [x]  Yes    []  No Home Monitoring?: [x]  Yes, every now and again.  He does not recall range. SOB? []  Yes    [x]  No Chest Pain?: []  Yes    [x]  No Leg swelling?: []  Yes    [x]  No Headaches?: []  Yes    [x]  No Dizziness? []  Yes    [x]  No Comments:   Chronic Ca+ Brain Mass:  Pt missed MRI appt that was 12/16/2017.  Pt states he was not aware.  Has appt with Neurology Dr. Jannifer Franklin later this mth for eval of TICs  DM: Checks blood sugars occasionally.  He reports that they have been good.  He is diet controlled.  History of rectal CA: Referred to GI on last visit.  Note under the submitted referral states that he needs to call [place] office before he can be scheduled with a gastroenterologist.  I will print this information on his discharge summary so that he can show to his wife.  Still followed by PMR Dr. Letta Pate and get Botox injections to the left upper extremity.  Seen last month.  Patient Active Problem List   Diagnosis Date Noted  . Spastic hemiplegia affecting left nondominant side ([place]) 12/18/2017  . Subacromial impingement of left shoulder 06/09/2017  . Spastic neurogenic bladder 06/09/2017  . Dysarthria as late effect of stroke 04/01/2017  . Neurogenic bladder due to old stroke 12/25/2016  . Right pontine cerebrovascular accident ([place]) 12/18/2016  . Left hemiparesis ([place])   . Diastolic dysfunction   . Dysphagia, post-stroke   .  Acute ischemic stroke ([place]) 12/12/2016  . Seizures ([place]) 07/22/2014  . Hypertension 07/22/2014  . Type 2 diabetes mellitus with vascular disease ([place]) 07/22/2014  . Brain mass   . Malignant neoplasm of rectum ([place]) 09/02/2007     Current Outpatient Medications on File Prior to Visit  Medication Sig Dispense Refill  . amLODipine (NORVASC) 10 MG tablet Take 1 tablet (10 mg total) by mouth daily. 30 tablet 11  . aspirin 325 MG tablet Take 1 tablet (325 mg total) by mouth daily. 30 tablet 0  . atorvastatin (LIPITOR) 80 MG tablet TAKE 1 TABLET BY MOUTH EVERY DAY AT 6PM 90 tablet 1  . carvedilol (COREG) 25 MG tablet TAKE 1 TABLET BY MOUTH TWICE A DAY WITH A MEAL 60 tablet 11  . FLUoxetine (PROZAC) 10 MG capsule Take 1 capsule (10 mg total) by mouth daily. 30 capsule 0  . losartan (COZAAR) 50 MG tablet Take 1 tablet (50 mg total) by mouth daily. 30 tablet 6  . Melatonin 3 MG TABS Take 3 tablets (9 mg total) by mouth at bedtime. 30 tablet 0  . oxybutynin (DITROPAN) 5 MG tablet Take 1 tablet (5 mg total) by mouth 2 (two) times daily. 60 tablet 2   No current facility-administered medications on file prior to visit.     No Known Allergies  Social History  Socioeconomic History  . Marital status: Married    Spouse name: [name]  . Number of children: 1  . Years of education: 89  . Highest education level: Not on file  Occupational History  . Occupation: Group Home  Social Needs  . Financial resource strain: Not on file  . Food insecurity:    Worry: Not on file    Inability: Not on file  . Transportation needs:    Medical: Not on file    Non-medical: Not on file  Tobacco Use  . Smoking status: Never Smoker  . Smokeless tobacco: Never Used  Substance and Sexual Activity  . Alcohol use: Yes    Frequency: Never    Comment: socially  . Drug use: No  . Sexual activity: Not on file  Lifestyle  . Physical activity:    Days per week: Not on file    Minutes per session: Not on file   . Stress: Not on file  Relationships  . Social connections:    Talks on phone: Not on file    Gets together: Not on file    Attends religious service: Not on file    Active member of club or organization: Not on file    Attends meetings of clubs or organizations: Not on file    Relationship status: Not on file  . Intimate partner violence:    Fear of current or ex partner: Not on file    Emotionally abused: Not on file    Physically abused: Not on file    Forced sexual activity: Not on file  Other Topics Concern  . Not on file  Social History Narrative   Patient drinks caffeine a couple times a week.   Patient is right handed.   Lives with wife [name]) and daughter    Family History  Problem Relation Age of Onset  . Hypertension Mother   . Heart disease Mother   . Hyperlipidemia Mother   . Heart failure Mother   . Hypertension Sister   . Hypertension Brother   . Stroke Father   . Hypertension Father   . Colon cancer Maternal Grandmother   . Diabetes Maternal Grandmother     Past Surgical History:  Procedure Laterality Date  . APPENDECTOMY    . JOINT REPLACEMENT Left 2004   Left hip  . TOTAL HIP ARTHROPLASTY      ROS: Review of Systems Negative except as above PHYSICAL EXAM: BP 116/77   Pulse 75   Temp 98.6 F (37 C) (Oral)   Resp 16   SpO2 99%   Wt Readings from Last 3 Encounters:  12/18/17 215 lb (97.5 kg)  09/28/17 220 lb (99.8 kg)  08/17/17 212 lb (96.2 kg)    Physical Exam  General appearance - alert, well appearing, and in no distress Mental status -flat affect.  Patient's answers questions appropriately. Mouth - mucous membranes moist, pharynx normal without lesions Neck - supple, no significant adenopathy Chest - clear to auscultation, no wheezes, rales or rhonchi, symmetric air entry Heart - normal rate, regular rhythm, normal S1, S2, no murmurs, rubs, clicks or gallops Neurological -patient in wheelchair.  He has spastic LT  hemeparesis Extremities -no lower extremity edema  Results for orders placed or performed in visit on 01/26/18  POCT glucose (manual entry)  Result Value Ref Range   POC Glucose 145 (A) 70 - 99 mg/dl     Chemistry      Component Value Date/Time   NA 144  11/23/2017 1706   K 4.0 11/23/2017 1706   CL 105 11/23/2017 1706   CO2 25 11/23/2017 1706   BUN 13 11/23/2017 1706   CREATININE 1.06 11/23/2017 1706      Component Value Date/Time   CALCIUM 9.5 11/23/2017 1706   ALKPHOS 100 11/23/2017 1706   AST 12 11/23/2017 1706   ALT 19 11/23/2017 1706   BILITOT 0.5 11/23/2017 1706     Lab Results  Component Value Date   WBC 4.6 11/23/2017   HGB 13.0 11/23/2017   HCT 37.4 (L) 11/23/2017   MCV 76 (L) 11/23/2017   PLT 235 11/23/2017   Lab Results  Component Value Date   CHOL 125 11/23/2017   HDL 38 (L) 11/23/2017   LDLCALC 65 11/23/2017   TRIG 111 11/23/2017   CHOLHDL 3.3 11/23/2017   Results for orders placed or performed in visit on 01/26/18  POCT glucose (manual entry)  Result Value Ref Range   POC Glucose 145 (A) 70 - 99 mg/dl   Lab Results  Component Value Date   HGBA1C 6.3 11/23/2017   ASSESSMENT AND PLAN: 1. Diabetes mellitus type 2, diet-controlled ([place]) Continue to encourage healthy eating habits. - POCT glucose (manual entry)  2. Type 2 diabetes mellitus with vascular disease ([facility]) 2. Spastic hemiplegia affecting left nondominant side, unspecified etiology ([place]) Followed by physical medicine and rehab  4. Rectal cancer ([place]) I have put the information regarding [facility] on his discharge summary so that his wife can see this information and contact them.  5.  Brain mass -I had my CMA rescheduled the MRI and write the information on his discharge summary.  Patient was given the opportunity to ask questions.  Patient verbalized understanding of the plan and was able to repeat key elements of the plan.   Orders Placed This Encounter  Procedures  . BUN+Creat   . POCT glucose (manual entry)     Requested Prescriptions    No prescriptions requested or ordered in this encounter    Return in about 4 months (around 05/27/2018).  Karle Plumber, MD, FACP

## 2018-01-27 ENCOUNTER — Telehealth: Payer: Self-pay | Admitting: Internal Medicine

## 2018-01-27 LAB — BUN+CREAT
BUN/Creatinine Ratio: 11 (ref 9–20)
BUN: 13 mg/dL (ref 6–24)
CREATININE: 1.15 mg/dL (ref 0.76–1.27)
GFR, EST AFRICAN AMERICAN: 85 mL/min/{1.73_m2} (ref >59)
GFR, EST NON AFRICAN AMERICAN: 73 mL/min/{1.73_m2} (ref >59)

## 2018-01-27 NOTE — Telephone Encounter (Signed)
Prior Authorization needed for MRI BRAIN appt 02/01/2018. Please call Ana at [facility] prior authorization now dept ph [phone] ext [phone].

## 2018-01-28 NOTE — Telephone Encounter (Signed)
Started prior auth for MRI Brain w/wo contrast. Dr. Wynetta Emery and I went through the clinical questions together and based on the results Pt doesn't meet the criteria for scan.

## 2018-01-29 ENCOUNTER — Ambulatory Visit: Payer: Managed Care, Other (non HMO) | Admitting: Physical Medicine & Rehabilitation

## 2018-01-29 NOTE — Telephone Encounter (Signed)
Spoke with Dr. Wynetta Emery regarding pt MRI. Per Dr. Wynetta Emery we will cancel the MRI appointment that is schedule for 02/01/2018.  Contacted the scheduling department and spoke with Ginger and canceled MRI Brain w wo contrast

## 2018-02-01 ENCOUNTER — Ambulatory Visit (HOSPITAL_COMMUNITY): Payer: BLUE CROSS/BLUE SHIELD

## 2018-02-03 ENCOUNTER — Telehealth: Payer: Self-pay | Admitting: Neurology

## 2018-02-03 ENCOUNTER — Ambulatory Visit: Payer: BC Managed Care – PPO | Admitting: Neurology

## 2018-02-03 NOTE — Telephone Encounter (Signed)
This patient did not show for a new patient appointment today. 

## 2018-02-04 ENCOUNTER — Encounter: Payer: Self-pay | Admitting: Neurology

## 2018-02-08 ENCOUNTER — Encounter: Payer: Self-pay | Admitting: Physical Medicine & Rehabilitation

## 2018-02-08 ENCOUNTER — Other Ambulatory Visit: Payer: Self-pay

## 2018-02-08 ENCOUNTER — Encounter: Payer: BLUE CROSS/BLUE SHIELD | Attending: Physical Medicine & Rehabilitation

## 2018-02-08 ENCOUNTER — Ambulatory Visit (HOSPITAL_BASED_OUTPATIENT_CLINIC_OR_DEPARTMENT_OTHER): Payer: BLUE CROSS/BLUE SHIELD | Admitting: Physical Medicine & Rehabilitation

## 2018-02-08 VITALS — BP 125/75 | HR 80 | Ht 63.0 in | Wt 215.0 lb

## 2018-02-08 DIAGNOSIS — I69398 Other sequelae of cerebral infarction: Secondary | ICD-10-CM | POA: Diagnosis not present

## 2018-02-08 DIAGNOSIS — F329 Major depressive disorder, single episode, unspecified: Secondary | ICD-10-CM | POA: Insufficient documentation

## 2018-02-08 DIAGNOSIS — Z85048 Personal history of other malignant neoplasm of rectum, rectosigmoid junction, and anus: Secondary | ICD-10-CM | POA: Diagnosis not present

## 2018-02-08 DIAGNOSIS — I69354 Hemiplegia and hemiparesis following cerebral infarction affecting left non-dominant side: Secondary | ICD-10-CM | POA: Insufficient documentation

## 2018-02-08 DIAGNOSIS — Z7982 Long term (current) use of aspirin: Secondary | ICD-10-CM | POA: Insufficient documentation

## 2018-02-08 DIAGNOSIS — G8114 Spastic hemiplegia affecting left nondominant side: Secondary | ICD-10-CM

## 2018-02-08 DIAGNOSIS — Z823 Family history of stroke: Secondary | ICD-10-CM | POA: Insufficient documentation

## 2018-02-08 DIAGNOSIS — Z8249 Family history of ischemic heart disease and other diseases of the circulatory system: Secondary | ICD-10-CM | POA: Insufficient documentation

## 2018-02-08 DIAGNOSIS — Z79899 Other long term (current) drug therapy: Secondary | ICD-10-CM | POA: Diagnosis not present

## 2018-02-08 DIAGNOSIS — N3289 Other specified disorders of bladder: Secondary | ICD-10-CM | POA: Diagnosis not present

## 2018-02-08 DIAGNOSIS — E119 Type 2 diabetes mellitus without complications: Secondary | ICD-10-CM | POA: Diagnosis not present

## 2018-02-08 DIAGNOSIS — I739 Peripheral vascular disease, unspecified: Secondary | ICD-10-CM | POA: Insufficient documentation

## 2018-02-08 DIAGNOSIS — I10 Essential (primary) hypertension: Secondary | ICD-10-CM | POA: Insufficient documentation

## 2018-02-08 DIAGNOSIS — R569 Unspecified convulsions: Secondary | ICD-10-CM | POA: Insufficient documentation

## 2018-02-08 DIAGNOSIS — E785 Hyperlipidemia, unspecified: Secondary | ICD-10-CM | POA: Insufficient documentation

## 2018-02-08 DIAGNOSIS — Z96642 Presence of left artificial hip joint: Secondary | ICD-10-CM | POA: Diagnosis not present

## 2018-02-08 NOTE — Progress Notes (Signed)
Botox Injection for spasticity using needle EMG guidance  Dilution: 50 Units/ml Indication: Severe spasticity which interferes with ADL,mobility and/or  hygiene and is unresponsive to medication management and other conservative care Informed consent was obtained after describing risks and benefits of the procedure with the patient. This includes bleeding, bruising, infection, excessive weakness, or medication side effects. A REMS form is on file and signed. Needle: 27g 1" needle electrode Number of units per muscle FDS 50- estim to 2nd and 3rd digit FDP 50 estim to 2nd and 3rd digit  FPL 50- good same FCR 50U FCU 50U Pronator teres 50U    All injections were done after obtaining appropriate EMG activity and after negative drawback for blood. The patient tolerated the procedure well. Post procedure instructions were given. A followup appointment was made.

## 2018-02-08 NOTE — Patient Instructions (Signed)

## 2018-03-17 ENCOUNTER — Other Ambulatory Visit: Payer: Self-pay | Admitting: Physical Medicine & Rehabilitation

## 2018-03-24 DIAGNOSIS — Z0289 Encounter for other administrative examinations: Secondary | ICD-10-CM

## 2018-03-25 ENCOUNTER — Other Ambulatory Visit: Payer: Self-pay

## 2018-03-25 ENCOUNTER — Encounter: Payer: BLUE CROSS/BLUE SHIELD | Attending: Physical Medicine & Rehabilitation

## 2018-03-25 ENCOUNTER — Encounter: Payer: Self-pay | Admitting: Physical Medicine & Rehabilitation

## 2018-03-25 ENCOUNTER — Ambulatory Visit (HOSPITAL_BASED_OUTPATIENT_CLINIC_OR_DEPARTMENT_OTHER): Payer: BLUE CROSS/BLUE SHIELD | Admitting: Physical Medicine & Rehabilitation

## 2018-03-25 VITALS — BP 107/68 | HR 84 | Ht 64.0 in | Wt 212.0 lb

## 2018-03-25 DIAGNOSIS — Z8249 Family history of ischemic heart disease and other diseases of the circulatory system: Secondary | ICD-10-CM | POA: Diagnosis not present

## 2018-03-25 DIAGNOSIS — I739 Peripheral vascular disease, unspecified: Secondary | ICD-10-CM | POA: Diagnosis not present

## 2018-03-25 DIAGNOSIS — I69354 Hemiplegia and hemiparesis following cerebral infarction affecting left non-dominant side: Secondary | ICD-10-CM | POA: Diagnosis not present

## 2018-03-25 DIAGNOSIS — Z823 Family history of stroke: Secondary | ICD-10-CM | POA: Diagnosis not present

## 2018-03-25 DIAGNOSIS — E785 Hyperlipidemia, unspecified: Secondary | ICD-10-CM | POA: Insufficient documentation

## 2018-03-25 DIAGNOSIS — I10 Essential (primary) hypertension: Secondary | ICD-10-CM | POA: Diagnosis not present

## 2018-03-25 DIAGNOSIS — E119 Type 2 diabetes mellitus without complications: Secondary | ICD-10-CM | POA: Insufficient documentation

## 2018-03-25 DIAGNOSIS — F329 Major depressive disorder, single episode, unspecified: Secondary | ICD-10-CM | POA: Diagnosis not present

## 2018-03-25 DIAGNOSIS — G8114 Spastic hemiplegia affecting left nondominant side: Secondary | ICD-10-CM

## 2018-03-25 DIAGNOSIS — N3289 Other specified disorders of bladder: Secondary | ICD-10-CM | POA: Insufficient documentation

## 2018-03-25 DIAGNOSIS — Z96642 Presence of left artificial hip joint: Secondary | ICD-10-CM | POA: Insufficient documentation

## 2018-03-25 DIAGNOSIS — Z79899 Other long term (current) drug therapy: Secondary | ICD-10-CM | POA: Diagnosis not present

## 2018-03-25 DIAGNOSIS — Z85048 Personal history of other malignant neoplasm of rectum, rectosigmoid junction, and anus: Secondary | ICD-10-CM | POA: Insufficient documentation

## 2018-03-25 DIAGNOSIS — I69398 Other sequelae of cerebral infarction: Secondary | ICD-10-CM | POA: Insufficient documentation

## 2018-03-25 DIAGNOSIS — Z7982 Long term (current) use of aspirin: Secondary | ICD-10-CM | POA: Insufficient documentation

## 2018-03-25 DIAGNOSIS — R569 Unspecified convulsions: Secondary | ICD-10-CM | POA: Insufficient documentation

## 2018-03-25 NOTE — Progress Notes (Signed)
Subjective:    Patient ID: [name], male    DOB: February 14, 1965, 53 y.o.   MRN: [id number]  HPI  Patient follows up after Botox injection performed 6 weeks ago Overall the patient feels the injections were helpful.  He is able to extend his thumb and his wrist, his fingers are still a little bent but not painful.  We also discussed his pronation which he states he does not have much issue with from a functional standpoint.  Pain Inventory Average Pain 0 Pain Right Now 0 My pain is na  In the last 24 hours, has pain interfered with the following? General activity 0 Relation with others 0 Enjoyment of life 0 What TIME of day is your pain at its worst? na Sleep (in general) Good  Pain is worse with: na Pain improves with: na Relief from Meds: na  Mobility ability to climb steps?  no do you drive?  no use a wheelchair needs help with transfers  Function not employed: date last employed . I need assistance with the following:  dressing, bathing, meal prep, household duties and shopping  Neuro/Psych trouble walking spasms dizziness confusion  Prior Studies Any changes since last visit?  no  Physicians involved in your care Any changes since last visit?  no   Family History  Problem Relation Age of Onset  . Hypertension Mother   . Heart disease Mother   . Hyperlipidemia Mother   . Heart failure Mother   . Hypertension Sister   . Hypertension Brother   . Stroke Father   . Hypertension Father   . Colon cancer Maternal Grandmother   . Diabetes Maternal Grandmother    Social History   Socioeconomic History  . Marital status: Married    Spouse name: [name]  . Number of children: 1  . Years of education: 70  . Highest education level: Not on file  Occupational History  . Occupation: Group Home  Social Needs  . Financial resource strain: Not on file  . Food insecurity:    Worry: Not on file    Inability: Not on file  . Transportation needs:   Medical: Not on file    Non-medical: Not on file  Tobacco Use  . Smoking status: Never Smoker  . Smokeless tobacco: Never Used  Substance and Sexual Activity  . Alcohol use: Yes    Frequency: Never    Comment: socially  . Drug use: No  . Sexual activity: Not on file  Lifestyle  . Physical activity:    Days per week: Not on file    Minutes per session: Not on file  . Stress: Not on file  Relationships  . Social connections:    Talks on phone: Not on file    Gets together: Not on file    Attends religious service: Not on file    Active member of club or organization: Not on file    Attends meetings of clubs or organizations: Not on file    Relationship status: Not on file  Other Topics Concern  . Not on file  Social History Narrative   Patient drinks caffeine a couple times a week.   Patient is right handed.   Lives with wife [name]) and daughter   Past Surgical History:  Procedure Laterality Date  . APPENDECTOMY    . JOINT REPLACEMENT Left 2004   Left hip  . TOTAL HIP ARTHROPLASTY     Past Medical History:  Diagnosis Date  .  Colon cancer ([place])    rectal  . Diabetes mellitus without complication ([place])   . Hypertension   . Seizure ([place])    BP 107/68   Pulse 84   Ht 5\' 4"  (1.626 m)   Wt 212 lb (96.2 kg)   SpO2 92%   BMI 36.39 kg/m   Opioid Risk Score:   Fall Risk Score:  `1  Depression screen PHQ 2/9  Depression screen [facility] 2/9 01/26/2018 09/28/2017 06/30/2017 05/11/2017 03/31/2017 02/05/2017  Decreased Interest 0 0 0 0 0 0  Down, Depressed, Hopeless 0 0 0 0 0 0  PHQ - 2 Score 0 0 0 0 0 0     Review of Systems  Constitutional: Negative.   HENT: Negative.   Eyes: Negative.   Respiratory: Negative.   Cardiovascular: Negative.   Gastrointestinal: Negative.   Endocrine: Negative.   Genitourinary: Negative.   Musculoskeletal: Positive for gait problem and myalgias.  Skin: Negative.   Allergic/Immunologic: Negative.   Neurological: Positive for  dizziness.  Hematological: Negative.   Psychiatric/Behavioral: Positive for confusion.  All other systems reviewed and are negative.      Objective:   Physical Exam Vitals signs and nursing note reviewed.  Constitutional:      Appearance: Normal appearance.  HENT:     Head: Normocephalic and atraumatic.     Nose: Congestion present.     Mouth/Throat:     Pharynx: Oropharynx is clear.  Eyes:     Extraocular Movements: Extraocular movements intact.     Conjunctiva/sclera: Conjunctivae normal.     Pupils: Pupils are equal, round, and reactive to light.  Skin:    General: Skin is warm and dry.  Neurological:     General: No focal deficit present.     Mental Status: He is alert and oriented to person, place, and time.     Comments: Motor strength is 3- at the left deltoid to minus bicep tricep trace finger flexors 0 finger extensors  Tone MAS 0 at wrist flexors on the left MAS 0 at the thumb flexor on the left MAS 3 at digits 2 and 3 on the left MAS 2 at digits 4 and 5 on the left MAS 3 at the pronators  Psychiatric:        Mood and Affect: Mood normal.           Assessment & Plan:  1.  Left spastic hemiplegia secondary to history of right pontomedullary infarct in November 2018. Good response to botulinum toxin.  Would continue with current dosing and muscle group selection. Repeat in 6 weeks

## 2018-03-31 ENCOUNTER — Encounter: Payer: Self-pay | Admitting: Neurology

## 2018-03-31 ENCOUNTER — Telehealth: Payer: Self-pay

## 2018-03-31 ENCOUNTER — Ambulatory Visit (INDEPENDENT_AMBULATORY_CARE_PROVIDER_SITE_OTHER): Payer: BLUE CROSS/BLUE SHIELD | Admitting: Neurology

## 2018-03-31 ENCOUNTER — Other Ambulatory Visit: Payer: Self-pay

## 2018-03-31 VITALS — BP 109/71 | HR 87

## 2018-03-31 DIAGNOSIS — I69328 Other speech and language deficits following cerebral infarction: Secondary | ICD-10-CM

## 2018-03-31 DIAGNOSIS — R269 Unspecified abnormalities of gait and mobility: Secondary | ICD-10-CM

## 2018-03-31 DIAGNOSIS — I69322 Dysarthria following cerebral infarction: Secondary | ICD-10-CM

## 2018-03-31 DIAGNOSIS — R569 Unspecified convulsions: Secondary | ICD-10-CM

## 2018-03-31 DIAGNOSIS — I69359 Hemiplegia and hemiparesis following cerebral infarction affecting unspecified side: Secondary | ICD-10-CM

## 2018-03-31 HISTORY — DX: Other speech and language deficits following cerebral infarction: I69.328

## 2018-03-31 HISTORY — DX: Other speech and language deficits following cerebral infarction: I69.359

## 2018-03-31 HISTORY — DX: Unspecified abnormalities of gait and mobility: R26.9

## 2018-03-31 MED ORDER — OLMESARTAN MEDOXOMIL 20 MG PO TABS: 20.0000 mg | ORAL_TABLET | Freq: Every day | ORAL | 3 refills | Status: DC

## 2018-03-31 NOTE — Telephone Encounter (Signed)
[facility] is requesting an alternative to Losartan 50mg  they are still experiencing a drug shortage for the plain Losartan dosages

## 2018-03-31 NOTE — Progress Notes (Signed)
Reason for visit: History of stroke, history of meningioma, history of seizures  Referring physician: Dr. Darcella Cheshire is a 53 y.o. male  History of present illness:  [name] is a 53 year old right-handed black male with a history of a parafalcine meningioma, and a history of seizures.  The patient was last seen through this office around 07 July 2016.  The patient had not had a seizure since 2016, he had been off of his medications for a year prior to that last revisit.  The patient is still off of seizure medications, he has not had any recurrence.  He unfortunately suffered a stroke on 02 December 2016 involving the right lower pons and upper medullary area.  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  The patient has residual dysarthria, some dysphagia.  He is living at home with his wife, he has some aide and assistance coming in, he is being followed through rehab and is getting Botox injections regularly.  The patient has a history of diabetes and hypertension.  He claims that he is doing fairly well with his diabetes.  He comes in today for evaluation of the titubations and ataxia.  The patient is able to walk with a walker, he can walk up to 60 yards with a walker, he has not had any falls.  He denies issues controlling the bowels or the bladder.  He denies any numbness on the right or left sides of the body.  He denies double vision or loss of vision, he is able to read or watch TV without difficulty.  MRI of the brain has been ordered for April 2020.  The last MRI of the brain was done in December 2018.  Past Medical History:  Diagnosis Date  . Colon cancer ([place])    rectal  . Diabetes mellitus without complication ([place])   . Hypertension   . Seizure ([place])   . Stroke [facility]) 2018    Past Surgical History:  Procedure Laterality Date  . APPENDECTOMY    . JOINT REPLACEMENT Left 2004   Left hip  . TOTAL HIP ARTHROPLASTY      Family History  Problem Relation Age of Onset  . Hypertension Mother   . Heart disease Mother   . Hyperlipidemia Mother   . Heart failure Mother   . Hypertension Sister   . Hypertension Brother   . Stroke Father   . Hypertension Father   . Colon cancer Maternal Grandmother   . Diabetes Maternal Grandmother     Social history:  reports that he has never smoked. He has never used smokeless tobacco. He reports current alcohol use. He reports that he does not use drugs.  Medications:  Prior to Admission medications   Medication Sig Start Date End Date Taking? Authorizing Provider  amLODipine (NORVASC) 10 MG tablet Take 1 tablet (10 mg total) by mouth daily. 11/23/17  Yes Ladell Pier, MD  aspirin 325 MG tablet Take 1 tablet (325 mg total) by mouth daily. 12/19/16  Yes Velvet Bathe, MD  atorvastatin (LIPITOR) 80 MG tablet TAKE 1 TABLET BY MOUTH EVERY DAY AT 6PM 11/23/17  Yes Ladell Pier, MD  carvedilol (COREG) 25 MG tablet TAKE 1 TABLET BY MOUTH TWICE A DAY WITH A MEAL 11/23/17  Yes Ladell Pier, MD  FLUoxetine (PROZAC) 10 MG capsule Take 1 capsule (10 mg total) by mouth daily. 03/10/17  Yes Kirsteins, Luanna Salk, MD  losartan (COZAAR) 50 MG tablet Take 1 tablet (50 mg total) by mouth daily. 01/11/18  Yes Ladell Pier, MD  Melatonin 3 MG TABS Take 3 tablets (9 mg total) by mouth at bedtime. 01/08/17  Yes Angiulli, Lavon Paganini, PA-C  oxybutynin (DITROPAN) 5 MG tablet TAKE 1 TABLET BY MOUTH TWICE A DAY 03/18/18  Yes Kirsteins, Luanna Salk, MD     No Known Allergies  ROS:  Out of a complete 14 system review of symptoms, the patient complains only of the following symptoms, and all other reviewed systems are negative.  Speech difficulty, tremors  Blood pressure 109/71, pulse 87.  Physical Exam  General: The patient is alert and cooperative at the time of the examination.  The patient is moderately obese.  Eyes: Pupils are  equal, round, and reactive to light. Discs are flat bilaterally.  Neck: The neck is supple, no carotid bruits are noted.  Respiratory: The respiratory examination is clear.  Cardiovascular: The cardiovascular examination reveals a regular rate and rhythm, no obvious murmurs or rubs are noted.  Skin: Extremities are without significant edema.  Neurologic Exam  Mental status: The patient is alert and oriented x 3 at the time of the examination. The patient has apparent normal recent and remote memory, with an apparently normal attention span and concentration ability.  Cranial nerves: Facial symmetry is present. There is good sensation of the face to pinprick and soft touch bilaterally. The strength of the facial muscles and the muscles to head turning and shoulder shrug are normal bilaterally. Speech is slow, ataxic and dysarthric, not aphasic. Extraocular movements are full. Visual fields are full. The tongue is midline, and the patient has symmetric elevation of the soft palate. No obvious hearing deficits are noted.  Obvious titubations of the upper spine and neck is noted.  Motor: The motor testing reveals 5 over 5 strength of the right extremities.  The patient has decreased grip with the left arm, 4-/5 strength with biceps and triceps strength and deltoid strength on the left.  The patient has 4+/5 strength with hip flexion, adduction, abduction, and with knee flexion extension on the left leg, he has a footdrop on the left.  Motor tone is increased on the left arm and leg.   Sensory: Sensory testing is intact to pinprick, soft touch, vibration sensation, and position sense on all 4 extremities, with exception of some decreased position sense in the left arm and leg. No evidence of extinction is noted.  Coordination: Cerebellar testing reveals very mild dysmetria with finger-nose-finger with the right arm and right leg, the patient has difficult performed on the left side secondary to  hemiparesis.  Gait and station: The patient is able to walk with some assistance, he came in today with in a wheelchair.  The patient has a circumduction type gait with the left leg, wide-based stance.  Tandem gait was not attempted.  Reflexes: Deep tendon reflexes are somewhat elevated on the left arm and left leg relative to the right. Toes are downgoing on the right, upgoing on the left.   MRI brain 12/13/16:  IMPRESSION: Acute RIGHT paramedian brainstem infarct affecting lower pons and upper medulla, is responsible for the reported symptoms. This appears to represent a small vessel insult, as the adjacent major vascular structures appear widely patent.  Heavily calcified mass, probably intra-axial, involves the medial RIGHT hemisphere, slightly increased from 2016. Further assessment could be performed with post infusion imaging as clinically indicated.  Atrophy and small vessel disease.  * MRI scan images were reviewed online. I agree with the written report.    Assessment/Plan:  1.  History of parafalcine meningioma  2.  History of seizures, no recurrence off medication  3.  Lower right pontine/upper medullary stroke  4.  Gait disturbance  The patient does not have a true tremor, he has titubations of the axial muscles and some dysmetria of the right arm associated with impairment of cerebellar pathways from the brainstem stroke.  Medications used for tremors are unlikely to be helpful in this case.  The patient indicates that he requires some assistance with bathing and dressing.  I will get home health physical therapy and occupational therapy out of the house to help improve his gait stability and stamina, and help with independence with use of his arms.  The patient will follow-up here in about 6 months.   Jill Alexanders MD 03/31/2018 9:34 AM  [facility] [address] [place], [place] [address]  Phone [phone] Fax  [phone]

## 2018-03-31 NOTE — Telephone Encounter (Signed)
Contacted pt to go over Dr. Johnson message pt is aware and doesn't have any questions or concerns  

## 2018-04-02 ENCOUNTER — Telehealth: Payer: Self-pay | Admitting: Neurology

## 2018-04-02 NOTE — Telephone Encounter (Signed)
Events noted, physical therapy will be started next week.

## 2018-04-02 NOTE — Telephone Encounter (Signed)
Clinical Manager Turley called to inform that re: verbal orders the Physical therapy will start on 03-23, Deanna can be reached at [phone] if there are questions.

## 2018-04-05 DIAGNOSIS — I69322 Dysarthria following cerebral infarction: Secondary | ICD-10-CM | POA: Diagnosis not present

## 2018-04-05 DIAGNOSIS — I10 Essential (primary) hypertension: Secondary | ICD-10-CM | POA: Diagnosis not present

## 2018-04-05 DIAGNOSIS — I69354 Hemiplegia and hemiparesis following cerebral infarction affecting left non-dominant side: Secondary | ICD-10-CM | POA: Diagnosis not present

## 2018-04-05 DIAGNOSIS — R1310 Dysphagia, unspecified: Secondary | ICD-10-CM | POA: Diagnosis not present

## 2018-04-05 DIAGNOSIS — I69393 Ataxia following cerebral infarction: Secondary | ICD-10-CM | POA: Diagnosis not present

## 2018-04-05 DIAGNOSIS — E119 Type 2 diabetes mellitus without complications: Secondary | ICD-10-CM | POA: Diagnosis not present

## 2018-04-05 DIAGNOSIS — I69391 Dysphagia following cerebral infarction: Secondary | ICD-10-CM | POA: Diagnosis not present

## 2018-04-05 DIAGNOSIS — Z86011 Personal history of benign neoplasm of the brain: Secondary | ICD-10-CM | POA: Diagnosis not present

## 2018-04-05 DIAGNOSIS — G40909 Epilepsy, unspecified, not intractable, without status epilepticus: Secondary | ICD-10-CM | POA: Diagnosis not present

## 2018-04-05 NOTE — Telephone Encounter (Signed)
[place]/[facility] [phone] request verbal order for OT evaluation. Please call to advise

## 2018-04-06 NOTE — Telephone Encounter (Signed)
PT evaluation order provided to [facility] verbally.

## 2018-04-08 DIAGNOSIS — R1310 Dysphagia, unspecified: Secondary | ICD-10-CM | POA: Diagnosis not present

## 2018-04-08 DIAGNOSIS — Z86011 Personal history of benign neoplasm of the brain: Secondary | ICD-10-CM | POA: Diagnosis not present

## 2018-04-08 DIAGNOSIS — I69391 Dysphagia following cerebral infarction: Secondary | ICD-10-CM | POA: Diagnosis not present

## 2018-04-08 DIAGNOSIS — I10 Essential (primary) hypertension: Secondary | ICD-10-CM | POA: Diagnosis not present

## 2018-04-08 DIAGNOSIS — G40909 Epilepsy, unspecified, not intractable, without status epilepticus: Secondary | ICD-10-CM | POA: Diagnosis not present

## 2018-04-08 DIAGNOSIS — I69322 Dysarthria following cerebral infarction: Secondary | ICD-10-CM | POA: Diagnosis not present

## 2018-04-08 DIAGNOSIS — I69393 Ataxia following cerebral infarction: Secondary | ICD-10-CM | POA: Diagnosis not present

## 2018-04-08 DIAGNOSIS — I69354 Hemiplegia and hemiparesis following cerebral infarction affecting left non-dominant side: Secondary | ICD-10-CM | POA: Diagnosis not present

## 2018-04-08 DIAGNOSIS — E119 Type 2 diabetes mellitus without complications: Secondary | ICD-10-CM | POA: Diagnosis not present

## 2018-04-12 DIAGNOSIS — Z86011 Personal history of benign neoplasm of the brain: Secondary | ICD-10-CM | POA: Diagnosis not present

## 2018-04-12 DIAGNOSIS — I69393 Ataxia following cerebral infarction: Secondary | ICD-10-CM | POA: Diagnosis not present

## 2018-04-12 DIAGNOSIS — R1310 Dysphagia, unspecified: Secondary | ICD-10-CM | POA: Diagnosis not present

## 2018-04-12 DIAGNOSIS — I69391 Dysphagia following cerebral infarction: Secondary | ICD-10-CM | POA: Diagnosis not present

## 2018-04-12 DIAGNOSIS — G40909 Epilepsy, unspecified, not intractable, without status epilepticus: Secondary | ICD-10-CM | POA: Diagnosis not present

## 2018-04-12 DIAGNOSIS — I69322 Dysarthria following cerebral infarction: Secondary | ICD-10-CM | POA: Diagnosis not present

## 2018-04-12 DIAGNOSIS — I69354 Hemiplegia and hemiparesis following cerebral infarction affecting left non-dominant side: Secondary | ICD-10-CM | POA: Diagnosis not present

## 2018-04-12 DIAGNOSIS — E119 Type 2 diabetes mellitus without complications: Secondary | ICD-10-CM | POA: Diagnosis not present

## 2018-04-12 DIAGNOSIS — I10 Essential (primary) hypertension: Secondary | ICD-10-CM | POA: Diagnosis not present

## 2018-04-14 DIAGNOSIS — G40909 Epilepsy, unspecified, not intractable, without status epilepticus: Secondary | ICD-10-CM | POA: Diagnosis not present

## 2018-04-14 DIAGNOSIS — R1310 Dysphagia, unspecified: Secondary | ICD-10-CM | POA: Diagnosis not present

## 2018-04-14 DIAGNOSIS — Z86011 Personal history of benign neoplasm of the brain: Secondary | ICD-10-CM | POA: Diagnosis not present

## 2018-04-14 DIAGNOSIS — I69322 Dysarthria following cerebral infarction: Secondary | ICD-10-CM | POA: Diagnosis not present

## 2018-04-14 DIAGNOSIS — I10 Essential (primary) hypertension: Secondary | ICD-10-CM | POA: Diagnosis not present

## 2018-04-14 DIAGNOSIS — E119 Type 2 diabetes mellitus without complications: Secondary | ICD-10-CM | POA: Diagnosis not present

## 2018-04-14 DIAGNOSIS — I69393 Ataxia following cerebral infarction: Secondary | ICD-10-CM | POA: Diagnosis not present

## 2018-04-14 DIAGNOSIS — I69354 Hemiplegia and hemiparesis following cerebral infarction affecting left non-dominant side: Secondary | ICD-10-CM | POA: Diagnosis not present

## 2018-04-14 DIAGNOSIS — I69391 Dysphagia following cerebral infarction: Secondary | ICD-10-CM | POA: Diagnosis not present

## 2018-04-19 ENCOUNTER — Ambulatory Visit (HOSPITAL_COMMUNITY): Admission: RE | Admit: 2018-04-19 | Payer: BLUE CROSS/BLUE SHIELD | Source: Ambulatory Visit

## 2018-04-19 DIAGNOSIS — G40909 Epilepsy, unspecified, not intractable, without status epilepticus: Secondary | ICD-10-CM | POA: Diagnosis not present

## 2018-04-19 DIAGNOSIS — I69354 Hemiplegia and hemiparesis following cerebral infarction affecting left non-dominant side: Secondary | ICD-10-CM | POA: Diagnosis not present

## 2018-04-19 DIAGNOSIS — Z86011 Personal history of benign neoplasm of the brain: Secondary | ICD-10-CM | POA: Diagnosis not present

## 2018-04-19 DIAGNOSIS — I69391 Dysphagia following cerebral infarction: Secondary | ICD-10-CM | POA: Diagnosis not present

## 2018-04-19 DIAGNOSIS — R1310 Dysphagia, unspecified: Secondary | ICD-10-CM | POA: Diagnosis not present

## 2018-04-19 DIAGNOSIS — I69322 Dysarthria following cerebral infarction: Secondary | ICD-10-CM | POA: Diagnosis not present

## 2018-04-19 DIAGNOSIS — I69393 Ataxia following cerebral infarction: Secondary | ICD-10-CM | POA: Diagnosis not present

## 2018-04-19 DIAGNOSIS — I10 Essential (primary) hypertension: Secondary | ICD-10-CM | POA: Diagnosis not present

## 2018-04-19 DIAGNOSIS — E119 Type 2 diabetes mellitus without complications: Secondary | ICD-10-CM | POA: Diagnosis not present

## 2018-04-21 DIAGNOSIS — I10 Essential (primary) hypertension: Secondary | ICD-10-CM | POA: Diagnosis not present

## 2018-04-21 DIAGNOSIS — G40909 Epilepsy, unspecified, not intractable, without status epilepticus: Secondary | ICD-10-CM | POA: Diagnosis not present

## 2018-04-21 DIAGNOSIS — R1310 Dysphagia, unspecified: Secondary | ICD-10-CM | POA: Diagnosis not present

## 2018-04-21 DIAGNOSIS — I69393 Ataxia following cerebral infarction: Secondary | ICD-10-CM | POA: Diagnosis not present

## 2018-04-21 DIAGNOSIS — Z86011 Personal history of benign neoplasm of the brain: Secondary | ICD-10-CM | POA: Diagnosis not present

## 2018-04-21 DIAGNOSIS — I69354 Hemiplegia and hemiparesis following cerebral infarction affecting left non-dominant side: Secondary | ICD-10-CM | POA: Diagnosis not present

## 2018-04-21 DIAGNOSIS — I69322 Dysarthria following cerebral infarction: Secondary | ICD-10-CM | POA: Diagnosis not present

## 2018-04-21 DIAGNOSIS — E119 Type 2 diabetes mellitus without complications: Secondary | ICD-10-CM | POA: Diagnosis not present

## 2018-04-21 DIAGNOSIS — I69391 Dysphagia following cerebral infarction: Secondary | ICD-10-CM | POA: Diagnosis not present

## 2018-04-22 ENCOUNTER — Other Ambulatory Visit: Payer: Self-pay | Admitting: Internal Medicine

## 2018-04-22 DIAGNOSIS — I10 Essential (primary) hypertension: Secondary | ICD-10-CM

## 2018-04-26 DIAGNOSIS — G40909 Epilepsy, unspecified, not intractable, without status epilepticus: Secondary | ICD-10-CM | POA: Diagnosis not present

## 2018-04-26 DIAGNOSIS — E119 Type 2 diabetes mellitus without complications: Secondary | ICD-10-CM | POA: Diagnosis not present

## 2018-04-26 DIAGNOSIS — I10 Essential (primary) hypertension: Secondary | ICD-10-CM | POA: Diagnosis not present

## 2018-04-26 DIAGNOSIS — Z86011 Personal history of benign neoplasm of the brain: Secondary | ICD-10-CM | POA: Diagnosis not present

## 2018-04-26 DIAGNOSIS — I69393 Ataxia following cerebral infarction: Secondary | ICD-10-CM | POA: Diagnosis not present

## 2018-04-26 DIAGNOSIS — I69391 Dysphagia following cerebral infarction: Secondary | ICD-10-CM | POA: Diagnosis not present

## 2018-04-26 DIAGNOSIS — I69354 Hemiplegia and hemiparesis following cerebral infarction affecting left non-dominant side: Secondary | ICD-10-CM | POA: Diagnosis not present

## 2018-04-26 DIAGNOSIS — R1310 Dysphagia, unspecified: Secondary | ICD-10-CM | POA: Diagnosis not present

## 2018-04-26 DIAGNOSIS — I69322 Dysarthria following cerebral infarction: Secondary | ICD-10-CM | POA: Diagnosis not present

## 2018-04-28 ENCOUNTER — Telehealth: Payer: Self-pay | Admitting: Neurology

## 2018-04-28 DIAGNOSIS — I69354 Hemiplegia and hemiparesis following cerebral infarction affecting left non-dominant side: Secondary | ICD-10-CM | POA: Diagnosis not present

## 2018-04-28 DIAGNOSIS — I69393 Ataxia following cerebral infarction: Secondary | ICD-10-CM | POA: Diagnosis not present

## 2018-04-28 DIAGNOSIS — I69322 Dysarthria following cerebral infarction: Secondary | ICD-10-CM | POA: Diagnosis not present

## 2018-04-28 DIAGNOSIS — Z86011 Personal history of benign neoplasm of the brain: Secondary | ICD-10-CM | POA: Diagnosis not present

## 2018-04-28 DIAGNOSIS — I69391 Dysphagia following cerebral infarction: Secondary | ICD-10-CM | POA: Diagnosis not present

## 2018-04-28 DIAGNOSIS — R1310 Dysphagia, unspecified: Secondary | ICD-10-CM | POA: Diagnosis not present

## 2018-04-28 DIAGNOSIS — E119 Type 2 diabetes mellitus without complications: Secondary | ICD-10-CM | POA: Diagnosis not present

## 2018-04-28 DIAGNOSIS — I10 Essential (primary) hypertension: Secondary | ICD-10-CM | POA: Diagnosis not present

## 2018-04-28 DIAGNOSIS — G40909 Epilepsy, unspecified, not intractable, without status epilepticus: Secondary | ICD-10-CM | POA: Diagnosis not present

## 2018-04-28 NOTE — Telephone Encounter (Signed)
I contacted [name] back and provided verbal orders for: PT twice a week for 5 weeks and for OT evaluation.

## 2018-04-28 NOTE — Telephone Encounter (Signed)
Physical Therapist [name] has called for verbal orders to extend PT for 2 week 5 and to request an OT evaluation

## 2018-05-03 DIAGNOSIS — E119 Type 2 diabetes mellitus without complications: Secondary | ICD-10-CM | POA: Diagnosis not present

## 2018-05-03 DIAGNOSIS — I69391 Dysphagia following cerebral infarction: Secondary | ICD-10-CM | POA: Diagnosis not present

## 2018-05-03 DIAGNOSIS — G40909 Epilepsy, unspecified, not intractable, without status epilepticus: Secondary | ICD-10-CM | POA: Diagnosis not present

## 2018-05-03 DIAGNOSIS — Z86011 Personal history of benign neoplasm of the brain: Secondary | ICD-10-CM | POA: Diagnosis not present

## 2018-05-03 DIAGNOSIS — R1310 Dysphagia, unspecified: Secondary | ICD-10-CM | POA: Diagnosis not present

## 2018-05-03 DIAGNOSIS — I69393 Ataxia following cerebral infarction: Secondary | ICD-10-CM | POA: Diagnosis not present

## 2018-05-03 DIAGNOSIS — I10 Essential (primary) hypertension: Secondary | ICD-10-CM | POA: Diagnosis not present

## 2018-05-03 DIAGNOSIS — I69354 Hemiplegia and hemiparesis following cerebral infarction affecting left non-dominant side: Secondary | ICD-10-CM | POA: Diagnosis not present

## 2018-05-03 DIAGNOSIS — I69322 Dysarthria following cerebral infarction: Secondary | ICD-10-CM | POA: Diagnosis not present

## 2018-05-05 ENCOUNTER — Telehealth: Payer: Self-pay

## 2018-05-05 DIAGNOSIS — I69391 Dysphagia following cerebral infarction: Secondary | ICD-10-CM | POA: Diagnosis not present

## 2018-05-05 DIAGNOSIS — I69393 Ataxia following cerebral infarction: Secondary | ICD-10-CM | POA: Diagnosis not present

## 2018-05-05 DIAGNOSIS — I10 Essential (primary) hypertension: Secondary | ICD-10-CM | POA: Diagnosis not present

## 2018-05-05 DIAGNOSIS — Z86011 Personal history of benign neoplasm of the brain: Secondary | ICD-10-CM | POA: Diagnosis not present

## 2018-05-05 DIAGNOSIS — I69354 Hemiplegia and hemiparesis following cerebral infarction affecting left non-dominant side: Secondary | ICD-10-CM | POA: Diagnosis not present

## 2018-05-05 DIAGNOSIS — R1310 Dysphagia, unspecified: Secondary | ICD-10-CM | POA: Diagnosis not present

## 2018-05-05 DIAGNOSIS — I69322 Dysarthria following cerebral infarction: Secondary | ICD-10-CM | POA: Diagnosis not present

## 2018-05-05 DIAGNOSIS — G40909 Epilepsy, unspecified, not intractable, without status epilepticus: Secondary | ICD-10-CM | POA: Diagnosis not present

## 2018-05-05 DIAGNOSIS — E119 Type 2 diabetes mellitus without complications: Secondary | ICD-10-CM | POA: Diagnosis not present

## 2018-05-05 NOTE — Telephone Encounter (Signed)
Received a call transferred by phone room staff from [place], [place] therapist with [facility]. He was asking for verbal orders for OT 1 time a week for 5 weeks. I provided verbal order.

## 2018-05-06 ENCOUNTER — Encounter: Payer: BLUE CROSS/BLUE SHIELD | Attending: Physical Medicine & Rehabilitation

## 2018-05-06 ENCOUNTER — Ambulatory Visit (HOSPITAL_BASED_OUTPATIENT_CLINIC_OR_DEPARTMENT_OTHER): Payer: BLUE CROSS/BLUE SHIELD | Admitting: Physical Medicine & Rehabilitation

## 2018-05-06 ENCOUNTER — Other Ambulatory Visit: Payer: Self-pay

## 2018-05-06 ENCOUNTER — Encounter: Payer: Self-pay | Admitting: Physical Medicine & Rehabilitation

## 2018-05-06 VITALS — BP 109/70 | HR 76 | Resp 14

## 2018-05-06 DIAGNOSIS — Z823 Family history of stroke: Secondary | ICD-10-CM | POA: Insufficient documentation

## 2018-05-06 DIAGNOSIS — N3289 Other specified disorders of bladder: Secondary | ICD-10-CM | POA: Insufficient documentation

## 2018-05-06 DIAGNOSIS — F329 Major depressive disorder, single episode, unspecified: Secondary | ICD-10-CM | POA: Insufficient documentation

## 2018-05-06 DIAGNOSIS — Z96642 Presence of left artificial hip joint: Secondary | ICD-10-CM | POA: Diagnosis not present

## 2018-05-06 DIAGNOSIS — G8114 Spastic hemiplegia affecting left nondominant side: Secondary | ICD-10-CM

## 2018-05-06 DIAGNOSIS — R569 Unspecified convulsions: Secondary | ICD-10-CM | POA: Diagnosis not present

## 2018-05-06 DIAGNOSIS — Z85048 Personal history of other malignant neoplasm of rectum, rectosigmoid junction, and anus: Secondary | ICD-10-CM | POA: Diagnosis not present

## 2018-05-06 DIAGNOSIS — E785 Hyperlipidemia, unspecified: Secondary | ICD-10-CM | POA: Diagnosis not present

## 2018-05-06 DIAGNOSIS — Z79899 Other long term (current) drug therapy: Secondary | ICD-10-CM | POA: Diagnosis not present

## 2018-05-06 DIAGNOSIS — I69398 Other sequelae of cerebral infarction: Secondary | ICD-10-CM | POA: Insufficient documentation

## 2018-05-06 DIAGNOSIS — Z8249 Family history of ischemic heart disease and other diseases of the circulatory system: Secondary | ICD-10-CM | POA: Diagnosis not present

## 2018-05-06 DIAGNOSIS — I69354 Hemiplegia and hemiparesis following cerebral infarction affecting left non-dominant side: Secondary | ICD-10-CM | POA: Insufficient documentation

## 2018-05-06 DIAGNOSIS — E119 Type 2 diabetes mellitus without complications: Secondary | ICD-10-CM | POA: Diagnosis not present

## 2018-05-06 DIAGNOSIS — I10 Essential (primary) hypertension: Secondary | ICD-10-CM | POA: Insufficient documentation

## 2018-05-06 DIAGNOSIS — Z7982 Long term (current) use of aspirin: Secondary | ICD-10-CM | POA: Insufficient documentation

## 2018-05-06 DIAGNOSIS — I739 Peripheral vascular disease, unspecified: Secondary | ICD-10-CM | POA: Diagnosis not present

## 2018-05-06 NOTE — Progress Notes (Signed)
Botox Injection for spasticity using needle EMG guidance  Dilution: 50 Units/ml Indication: Severe spasticity which interferes with ADL,mobility and/or  hygiene and is unresponsive to medication management and other conservative care Informed consent was obtained after describing risks and benefits of the procedure with the patient. This includes bleeding, bruising, infection, excessive weakness, or medication side effects. A REMS form is on file and signed. Needle: 27g 1" needle electrode Number of units per muscle  FDS 50- estim to 2nd and 3rd digit- difficult to isolate FDP 50 estim to 2nd and 3rd digit- difficult to isolate  FPL 0 no spasticity in thumb flexor noted today FCR 50U FCU 50U Pronator teres 50U All injections were done after obtaining appropriate EMG activity and after negative drawback for blood. The patient tolerated the procedure well. Post procedure instructions were given. A followup appointment was made.

## 2018-05-06 NOTE — Patient Instructions (Signed)

## 2018-05-06 NOTE — Telephone Encounter (Signed)
I called the patient, left a message.  Physical therapy indicated that there was no answer at the door.  Unable to reach the patient by telephone.

## 2018-05-06 NOTE — Telephone Encounter (Signed)
Physical Therapy called in and stated there was no answer to the door , pt was complaining of dizziness last visit

## 2018-05-10 DIAGNOSIS — I69322 Dysarthria following cerebral infarction: Secondary | ICD-10-CM | POA: Diagnosis not present

## 2018-05-10 DIAGNOSIS — I10 Essential (primary) hypertension: Secondary | ICD-10-CM | POA: Diagnosis not present

## 2018-05-10 DIAGNOSIS — I69391 Dysphagia following cerebral infarction: Secondary | ICD-10-CM | POA: Diagnosis not present

## 2018-05-10 DIAGNOSIS — G40909 Epilepsy, unspecified, not intractable, without status epilepticus: Secondary | ICD-10-CM | POA: Diagnosis not present

## 2018-05-10 DIAGNOSIS — Z86011 Personal history of benign neoplasm of the brain: Secondary | ICD-10-CM | POA: Diagnosis not present

## 2018-05-10 DIAGNOSIS — R1310 Dysphagia, unspecified: Secondary | ICD-10-CM | POA: Diagnosis not present

## 2018-05-10 DIAGNOSIS — I69393 Ataxia following cerebral infarction: Secondary | ICD-10-CM | POA: Diagnosis not present

## 2018-05-10 DIAGNOSIS — I69354 Hemiplegia and hemiparesis following cerebral infarction affecting left non-dominant side: Secondary | ICD-10-CM | POA: Diagnosis not present

## 2018-05-10 DIAGNOSIS — E119 Type 2 diabetes mellitus without complications: Secondary | ICD-10-CM | POA: Diagnosis not present

## 2018-05-13 ENCOUNTER — Other Ambulatory Visit: Payer: Self-pay | Admitting: Physical Medicine & Rehabilitation

## 2018-05-13 ENCOUNTER — Other Ambulatory Visit: Payer: Self-pay | Admitting: Internal Medicine

## 2018-05-13 DIAGNOSIS — I69359 Hemiplegia and hemiparesis following cerebral infarction affecting unspecified side: Secondary | ICD-10-CM

## 2018-05-14 DIAGNOSIS — Z86011 Personal history of benign neoplasm of the brain: Secondary | ICD-10-CM | POA: Diagnosis not present

## 2018-05-14 DIAGNOSIS — I69322 Dysarthria following cerebral infarction: Secondary | ICD-10-CM | POA: Diagnosis not present

## 2018-05-14 DIAGNOSIS — I69391 Dysphagia following cerebral infarction: Secondary | ICD-10-CM | POA: Diagnosis not present

## 2018-05-14 DIAGNOSIS — G40909 Epilepsy, unspecified, not intractable, without status epilepticus: Secondary | ICD-10-CM | POA: Diagnosis not present

## 2018-05-14 DIAGNOSIS — R1310 Dysphagia, unspecified: Secondary | ICD-10-CM | POA: Diagnosis not present

## 2018-05-14 DIAGNOSIS — E119 Type 2 diabetes mellitus without complications: Secondary | ICD-10-CM | POA: Diagnosis not present

## 2018-05-14 DIAGNOSIS — I69393 Ataxia following cerebral infarction: Secondary | ICD-10-CM | POA: Diagnosis not present

## 2018-05-14 DIAGNOSIS — I69354 Hemiplegia and hemiparesis following cerebral infarction affecting left non-dominant side: Secondary | ICD-10-CM | POA: Diagnosis not present

## 2018-05-14 DIAGNOSIS — I10 Essential (primary) hypertension: Secondary | ICD-10-CM | POA: Diagnosis not present

## 2018-05-17 DIAGNOSIS — G40909 Epilepsy, unspecified, not intractable, without status epilepticus: Secondary | ICD-10-CM | POA: Diagnosis not present

## 2018-05-17 DIAGNOSIS — I69393 Ataxia following cerebral infarction: Secondary | ICD-10-CM | POA: Diagnosis not present

## 2018-05-17 DIAGNOSIS — E119 Type 2 diabetes mellitus without complications: Secondary | ICD-10-CM | POA: Diagnosis not present

## 2018-05-17 DIAGNOSIS — I69354 Hemiplegia and hemiparesis following cerebral infarction affecting left non-dominant side: Secondary | ICD-10-CM | POA: Diagnosis not present

## 2018-05-17 DIAGNOSIS — Z86011 Personal history of benign neoplasm of the brain: Secondary | ICD-10-CM | POA: Diagnosis not present

## 2018-05-17 DIAGNOSIS — I69322 Dysarthria following cerebral infarction: Secondary | ICD-10-CM | POA: Diagnosis not present

## 2018-05-17 DIAGNOSIS — R1310 Dysphagia, unspecified: Secondary | ICD-10-CM | POA: Diagnosis not present

## 2018-05-17 DIAGNOSIS — I10 Essential (primary) hypertension: Secondary | ICD-10-CM | POA: Diagnosis not present

## 2018-05-17 DIAGNOSIS — I69391 Dysphagia following cerebral infarction: Secondary | ICD-10-CM | POA: Diagnosis not present

## 2018-05-19 DIAGNOSIS — I69354 Hemiplegia and hemiparesis following cerebral infarction affecting left non-dominant side: Secondary | ICD-10-CM | POA: Diagnosis not present

## 2018-05-19 DIAGNOSIS — G40909 Epilepsy, unspecified, not intractable, without status epilepticus: Secondary | ICD-10-CM | POA: Diagnosis not present

## 2018-05-19 DIAGNOSIS — R1310 Dysphagia, unspecified: Secondary | ICD-10-CM | POA: Diagnosis not present

## 2018-05-19 DIAGNOSIS — I10 Essential (primary) hypertension: Secondary | ICD-10-CM | POA: Diagnosis not present

## 2018-05-19 DIAGNOSIS — I69322 Dysarthria following cerebral infarction: Secondary | ICD-10-CM | POA: Diagnosis not present

## 2018-05-19 DIAGNOSIS — I69391 Dysphagia following cerebral infarction: Secondary | ICD-10-CM | POA: Diagnosis not present

## 2018-05-19 DIAGNOSIS — E119 Type 2 diabetes mellitus without complications: Secondary | ICD-10-CM | POA: Diagnosis not present

## 2018-05-19 DIAGNOSIS — I69393 Ataxia following cerebral infarction: Secondary | ICD-10-CM | POA: Diagnosis not present

## 2018-05-19 DIAGNOSIS — Z86011 Personal history of benign neoplasm of the brain: Secondary | ICD-10-CM | POA: Diagnosis not present

## 2018-05-20 DIAGNOSIS — I69393 Ataxia following cerebral infarction: Secondary | ICD-10-CM | POA: Diagnosis not present

## 2018-05-20 DIAGNOSIS — I69354 Hemiplegia and hemiparesis following cerebral infarction affecting left non-dominant side: Secondary | ICD-10-CM | POA: Diagnosis not present

## 2018-05-20 DIAGNOSIS — E119 Type 2 diabetes mellitus without complications: Secondary | ICD-10-CM | POA: Diagnosis not present

## 2018-05-20 DIAGNOSIS — I10 Essential (primary) hypertension: Secondary | ICD-10-CM | POA: Diagnosis not present

## 2018-05-20 DIAGNOSIS — Z86011 Personal history of benign neoplasm of the brain: Secondary | ICD-10-CM | POA: Diagnosis not present

## 2018-05-20 DIAGNOSIS — G40909 Epilepsy, unspecified, not intractable, without status epilepticus: Secondary | ICD-10-CM | POA: Diagnosis not present

## 2018-05-20 DIAGNOSIS — I69391 Dysphagia following cerebral infarction: Secondary | ICD-10-CM | POA: Diagnosis not present

## 2018-05-20 DIAGNOSIS — R1310 Dysphagia, unspecified: Secondary | ICD-10-CM | POA: Diagnosis not present

## 2018-05-20 DIAGNOSIS — I69322 Dysarthria following cerebral infarction: Secondary | ICD-10-CM | POA: Diagnosis not present

## 2018-05-23 ENCOUNTER — Other Ambulatory Visit: Payer: Self-pay | Admitting: Internal Medicine

## 2018-05-23 DIAGNOSIS — I10 Essential (primary) hypertension: Secondary | ICD-10-CM

## 2018-05-24 DIAGNOSIS — I69393 Ataxia following cerebral infarction: Secondary | ICD-10-CM | POA: Diagnosis not present

## 2018-05-24 DIAGNOSIS — R1310 Dysphagia, unspecified: Secondary | ICD-10-CM | POA: Diagnosis not present

## 2018-05-24 DIAGNOSIS — G40909 Epilepsy, unspecified, not intractable, without status epilepticus: Secondary | ICD-10-CM | POA: Diagnosis not present

## 2018-05-24 DIAGNOSIS — E119 Type 2 diabetes mellitus without complications: Secondary | ICD-10-CM | POA: Diagnosis not present

## 2018-05-24 DIAGNOSIS — I10 Essential (primary) hypertension: Secondary | ICD-10-CM | POA: Diagnosis not present

## 2018-05-24 DIAGNOSIS — I69391 Dysphagia following cerebral infarction: Secondary | ICD-10-CM | POA: Diagnosis not present

## 2018-05-24 DIAGNOSIS — I69322 Dysarthria following cerebral infarction: Secondary | ICD-10-CM | POA: Diagnosis not present

## 2018-05-24 DIAGNOSIS — I69354 Hemiplegia and hemiparesis following cerebral infarction affecting left non-dominant side: Secondary | ICD-10-CM | POA: Diagnosis not present

## 2018-05-24 DIAGNOSIS — Z86011 Personal history of benign neoplasm of the brain: Secondary | ICD-10-CM | POA: Diagnosis not present

## 2018-05-25 ENCOUNTER — Other Ambulatory Visit: Payer: Self-pay

## 2018-05-25 ENCOUNTER — Ambulatory Visit: Payer: BLUE CROSS/BLUE SHIELD | Admitting: Internal Medicine

## 2018-05-25 ENCOUNTER — Ambulatory Visit: Payer: BLUE CROSS/BLUE SHIELD | Attending: Internal Medicine | Admitting: Internal Medicine

## 2018-05-25 DIAGNOSIS — I69359 Hemiplegia and hemiparesis following cerebral infarction affecting unspecified side: Secondary | ICD-10-CM

## 2018-05-25 DIAGNOSIS — I10 Essential (primary) hypertension: Secondary | ICD-10-CM

## 2018-05-25 DIAGNOSIS — E119 Type 2 diabetes mellitus without complications: Secondary | ICD-10-CM | POA: Diagnosis not present

## 2018-05-25 DIAGNOSIS — G9389 Other specified disorders of brain: Secondary | ICD-10-CM

## 2018-05-25 DIAGNOSIS — C20 Malignant neoplasm of rectum: Secondary | ICD-10-CM | POA: Diagnosis not present

## 2018-05-25 NOTE — Progress Notes (Signed)
Virtual Visit via Video Note Due to current restrictions/limitations of in-office visits due to the COVID-19 pandemic, this scheduled clinical appointment was converted to a telehealth visit  I connected with [name] on 05/25/18 at 2:06 p.m EDT by a video enabled telemedicine application and verified that I am speaking with the correct person using two identifiers. I am in my office.  The patient is at home.  Only the patient and myself participated in this encounter.  I discussed the limitations of evaluation and management by telemedicine and the availability of in person appointments. The patient expressed understanding and agreed to proceed.  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  Last seen 01/2018  Saw Dr. Jannifer Franklin for tremors/tics.  He did not feel that the patient was having true tremors but rather instability of the axial muscles with some dysmetria of the right arm.  He felt this was due to previous stroke Referred placed for home P.T and OT.  Getting P.T 2 x a wk and OT once a wk.  He finds it helpful so far.  No falls.  When he is not in his WC, he uses a 4 prong cane Regarding his brain mass, we tried to get the MRI approved through his insurance but was unsuccessful with that.   HTN:  Reports P.T checks BP on each visit.  He reports they have been good.  However, he does not have any of the #s to share today Reports compliance with meds  DM:  Not checking BS but does have a device at home to check.   Feels he is doing okay with eating habits.  Reports having eye exam about 1.5 mths ago at [facility].  Got new glasses  Rectal Cancer:  He never did get in with [facility].  I had given him the information on last visit about why [facility] delayed scheduling an appointment for him to give to his wife.    Observations/Objective:  Results for orders placed or performed in visit on 01/26/18  BUN+Creat  Result Value Ref Range   BUN 13 6 - 24 mg/dL   Creatinine, Ser 1.15 0.76 - 1.27 mg/dL   GFR calc non Af Amer 73 >59 mL/min/1.73   GFR calc Af Amer 85 >59 mL/min/1.73   BUN/Creatinine Ratio 11 9 - 20  POCT glucose (manual entry)  Result Value Ref Range   POC Glucose 145 (A) 70 - 99 mg/dl   Lab Results  Component Value Date   CHOL 125 11/23/2017   HDL 38 (L) 11/23/2017   LDLCALC 65 11/23/2017   TRIG 111 11/23/2017   CHOLHDL 3.3 11/23/2017   Lab Results  Component Value Date   WBC 4.6 11/23/2017   HGB 13.0 11/23/2017   HCT 37.4 (L) 11/23/2017   MCV 76 (L) 11/23/2017   PLT 235 11/23/2017    Assessment and Plan: 1. Essential hypertension Continue current blood pressure medications and salt restriction. Encourage patient to keep a log of his blood pressure readings when taken by physical therapist to share with me on our next visit  2. Diabetes mellitus type 2, diet-controlled ([place]) Advised to check blood sugars at least twice a week before breakfast with goal being less than 130.  3. Rectal cancer ([place]) We will try again to get him in with gastroenterology for colonoscopy  4. Brain mass MRI was denied by  his insurance  5. CVA, old, hemiparesis ([place]) Last seen Dr. Jannifer Franklin and is currently getting home PT and OT. Continue aspirin, atorvastatin and current blood pressure medications.  Follow Up Instructions: Follow-up in 3 months   I discussed the assessment and treatment plan with the patient. The patient was provided an opportunity to ask questions and all were answered. The patient agreed with the plan and demonstrated an understanding of the instructions.   The patient was advised to call back or seek an in-person evaluation if the symptoms worsen or if the condition fails to improve as anticipated.  I provided 15 minutes of non-face-to-face time during this  encounter.   Karle Plumber, MD

## 2018-05-26 ENCOUNTER — Telehealth: Payer: Self-pay | Admitting: Neurology

## 2018-05-26 DIAGNOSIS — I69391 Dysphagia following cerebral infarction: Secondary | ICD-10-CM | POA: Diagnosis not present

## 2018-05-26 DIAGNOSIS — E119 Type 2 diabetes mellitus without complications: Secondary | ICD-10-CM | POA: Diagnosis not present

## 2018-05-26 DIAGNOSIS — Z86011 Personal history of benign neoplasm of the brain: Secondary | ICD-10-CM | POA: Diagnosis not present

## 2018-05-26 DIAGNOSIS — I69354 Hemiplegia and hemiparesis following cerebral infarction affecting left non-dominant side: Secondary | ICD-10-CM | POA: Diagnosis not present

## 2018-05-26 DIAGNOSIS — G40909 Epilepsy, unspecified, not intractable, without status epilepticus: Secondary | ICD-10-CM | POA: Diagnosis not present

## 2018-05-26 DIAGNOSIS — I69393 Ataxia following cerebral infarction: Secondary | ICD-10-CM | POA: Diagnosis not present

## 2018-05-26 DIAGNOSIS — I69322 Dysarthria following cerebral infarction: Secondary | ICD-10-CM | POA: Diagnosis not present

## 2018-05-26 DIAGNOSIS — I10 Essential (primary) hypertension: Secondary | ICD-10-CM | POA: Diagnosis not present

## 2018-05-26 DIAGNOSIS — R1310 Dysphagia, unspecified: Secondary | ICD-10-CM | POA: Diagnosis not present

## 2018-05-26 NOTE — Telephone Encounter (Signed)
[facility] called in and stated pts OT will end today.

## 2018-05-26 NOTE — Telephone Encounter (Signed)
Noted  

## 2018-05-27 ENCOUNTER — Ambulatory Visit: Payer: Managed Care, Other (non HMO) | Admitting: Internal Medicine

## 2018-05-31 DIAGNOSIS — G40909 Epilepsy, unspecified, not intractable, without status epilepticus: Secondary | ICD-10-CM | POA: Diagnosis not present

## 2018-05-31 DIAGNOSIS — I69393 Ataxia following cerebral infarction: Secondary | ICD-10-CM | POA: Diagnosis not present

## 2018-05-31 DIAGNOSIS — I69354 Hemiplegia and hemiparesis following cerebral infarction affecting left non-dominant side: Secondary | ICD-10-CM | POA: Diagnosis not present

## 2018-05-31 DIAGNOSIS — I69391 Dysphagia following cerebral infarction: Secondary | ICD-10-CM | POA: Diagnosis not present

## 2018-05-31 DIAGNOSIS — Z86011 Personal history of benign neoplasm of the brain: Secondary | ICD-10-CM | POA: Diagnosis not present

## 2018-05-31 DIAGNOSIS — R1310 Dysphagia, unspecified: Secondary | ICD-10-CM | POA: Diagnosis not present

## 2018-05-31 DIAGNOSIS — E119 Type 2 diabetes mellitus without complications: Secondary | ICD-10-CM | POA: Diagnosis not present

## 2018-05-31 DIAGNOSIS — I10 Essential (primary) hypertension: Secondary | ICD-10-CM | POA: Diagnosis not present

## 2018-05-31 DIAGNOSIS — I69322 Dysarthria following cerebral infarction: Secondary | ICD-10-CM | POA: Diagnosis not present

## 2018-06-02 ENCOUNTER — Telehealth: Payer: Self-pay

## 2018-06-02 NOTE — Telephone Encounter (Signed)
Signed fax home health order for HHPT 2weeks 5 has been submitted to fax # [phone].  Confirmation received.

## 2018-06-03 DIAGNOSIS — R1310 Dysphagia, unspecified: Secondary | ICD-10-CM | POA: Diagnosis not present

## 2018-06-03 DIAGNOSIS — I69322 Dysarthria following cerebral infarction: Secondary | ICD-10-CM | POA: Diagnosis not present

## 2018-06-03 DIAGNOSIS — G40909 Epilepsy, unspecified, not intractable, without status epilepticus: Secondary | ICD-10-CM | POA: Diagnosis not present

## 2018-06-03 DIAGNOSIS — I10 Essential (primary) hypertension: Secondary | ICD-10-CM | POA: Diagnosis not present

## 2018-06-03 DIAGNOSIS — I69391 Dysphagia following cerebral infarction: Secondary | ICD-10-CM | POA: Diagnosis not present

## 2018-06-03 DIAGNOSIS — Z86011 Personal history of benign neoplasm of the brain: Secondary | ICD-10-CM | POA: Diagnosis not present

## 2018-06-03 DIAGNOSIS — I69354 Hemiplegia and hemiparesis following cerebral infarction affecting left non-dominant side: Secondary | ICD-10-CM | POA: Diagnosis not present

## 2018-06-03 DIAGNOSIS — I69393 Ataxia following cerebral infarction: Secondary | ICD-10-CM | POA: Diagnosis not present

## 2018-06-03 DIAGNOSIS — E119 Type 2 diabetes mellitus without complications: Secondary | ICD-10-CM | POA: Diagnosis not present

## 2018-06-09 DIAGNOSIS — I69354 Hemiplegia and hemiparesis following cerebral infarction affecting left non-dominant side: Secondary | ICD-10-CM | POA: Diagnosis not present

## 2018-06-09 DIAGNOSIS — E119 Type 2 diabetes mellitus without complications: Secondary | ICD-10-CM | POA: Diagnosis not present

## 2018-06-09 DIAGNOSIS — Z86011 Personal history of benign neoplasm of the brain: Secondary | ICD-10-CM | POA: Diagnosis not present

## 2018-06-09 DIAGNOSIS — I10 Essential (primary) hypertension: Secondary | ICD-10-CM | POA: Diagnosis not present

## 2018-06-09 DIAGNOSIS — R1310 Dysphagia, unspecified: Secondary | ICD-10-CM | POA: Diagnosis not present

## 2018-06-09 DIAGNOSIS — I69391 Dysphagia following cerebral infarction: Secondary | ICD-10-CM | POA: Diagnosis not present

## 2018-06-09 DIAGNOSIS — I69322 Dysarthria following cerebral infarction: Secondary | ICD-10-CM | POA: Diagnosis not present

## 2018-06-09 DIAGNOSIS — G40909 Epilepsy, unspecified, not intractable, without status epilepticus: Secondary | ICD-10-CM | POA: Diagnosis not present

## 2018-06-09 DIAGNOSIS — I69393 Ataxia following cerebral infarction: Secondary | ICD-10-CM | POA: Diagnosis not present

## 2018-06-11 DIAGNOSIS — I69322 Dysarthria following cerebral infarction: Secondary | ICD-10-CM | POA: Diagnosis not present

## 2018-06-11 DIAGNOSIS — G40909 Epilepsy, unspecified, not intractable, without status epilepticus: Secondary | ICD-10-CM | POA: Diagnosis not present

## 2018-06-11 DIAGNOSIS — I69354 Hemiplegia and hemiparesis following cerebral infarction affecting left non-dominant side: Secondary | ICD-10-CM | POA: Diagnosis not present

## 2018-06-11 DIAGNOSIS — I69391 Dysphagia following cerebral infarction: Secondary | ICD-10-CM | POA: Diagnosis not present

## 2018-06-11 DIAGNOSIS — E119 Type 2 diabetes mellitus without complications: Secondary | ICD-10-CM | POA: Diagnosis not present

## 2018-06-11 DIAGNOSIS — R1310 Dysphagia, unspecified: Secondary | ICD-10-CM | POA: Diagnosis not present

## 2018-06-11 DIAGNOSIS — Z86011 Personal history of benign neoplasm of the brain: Secondary | ICD-10-CM | POA: Diagnosis not present

## 2018-06-11 DIAGNOSIS — I10 Essential (primary) hypertension: Secondary | ICD-10-CM | POA: Diagnosis not present

## 2018-06-11 DIAGNOSIS — I69393 Ataxia following cerebral infarction: Secondary | ICD-10-CM | POA: Diagnosis not present

## 2018-06-14 ENCOUNTER — Other Ambulatory Visit: Payer: Self-pay | Admitting: Internal Medicine

## 2018-06-14 DIAGNOSIS — I69322 Dysarthria following cerebral infarction: Secondary | ICD-10-CM | POA: Diagnosis not present

## 2018-06-14 DIAGNOSIS — I10 Essential (primary) hypertension: Secondary | ICD-10-CM | POA: Diagnosis not present

## 2018-06-14 DIAGNOSIS — G40909 Epilepsy, unspecified, not intractable, without status epilepticus: Secondary | ICD-10-CM | POA: Diagnosis not present

## 2018-06-14 DIAGNOSIS — I69393 Ataxia following cerebral infarction: Secondary | ICD-10-CM | POA: Diagnosis not present

## 2018-06-14 DIAGNOSIS — I69359 Hemiplegia and hemiparesis following cerebral infarction affecting unspecified side: Secondary | ICD-10-CM

## 2018-06-14 DIAGNOSIS — Z86011 Personal history of benign neoplasm of the brain: Secondary | ICD-10-CM | POA: Diagnosis not present

## 2018-06-14 DIAGNOSIS — I69354 Hemiplegia and hemiparesis following cerebral infarction affecting left non-dominant side: Secondary | ICD-10-CM | POA: Diagnosis not present

## 2018-06-14 DIAGNOSIS — R1310 Dysphagia, unspecified: Secondary | ICD-10-CM | POA: Diagnosis not present

## 2018-06-14 DIAGNOSIS — I69391 Dysphagia following cerebral infarction: Secondary | ICD-10-CM | POA: Diagnosis not present

## 2018-06-14 DIAGNOSIS — E119 Type 2 diabetes mellitus without complications: Secondary | ICD-10-CM | POA: Diagnosis not present

## 2018-06-16 DIAGNOSIS — I69354 Hemiplegia and hemiparesis following cerebral infarction affecting left non-dominant side: Secondary | ICD-10-CM | POA: Diagnosis not present

## 2018-06-16 DIAGNOSIS — Z86011 Personal history of benign neoplasm of the brain: Secondary | ICD-10-CM | POA: Diagnosis not present

## 2018-06-16 DIAGNOSIS — I10 Essential (primary) hypertension: Secondary | ICD-10-CM | POA: Diagnosis not present

## 2018-06-16 DIAGNOSIS — I69322 Dysarthria following cerebral infarction: Secondary | ICD-10-CM | POA: Diagnosis not present

## 2018-06-16 DIAGNOSIS — G40909 Epilepsy, unspecified, not intractable, without status epilepticus: Secondary | ICD-10-CM | POA: Diagnosis not present

## 2018-06-16 DIAGNOSIS — R1310 Dysphagia, unspecified: Secondary | ICD-10-CM | POA: Diagnosis not present

## 2018-06-16 DIAGNOSIS — I69393 Ataxia following cerebral infarction: Secondary | ICD-10-CM | POA: Diagnosis not present

## 2018-06-16 DIAGNOSIS — E119 Type 2 diabetes mellitus without complications: Secondary | ICD-10-CM | POA: Diagnosis not present

## 2018-06-16 DIAGNOSIS — I69391 Dysphagia following cerebral infarction: Secondary | ICD-10-CM | POA: Diagnosis not present

## 2018-06-23 ENCOUNTER — Telehealth: Payer: Self-pay | Admitting: Neurology

## 2018-06-23 DIAGNOSIS — I10 Essential (primary) hypertension: Secondary | ICD-10-CM | POA: Diagnosis not present

## 2018-06-23 DIAGNOSIS — I69322 Dysarthria following cerebral infarction: Secondary | ICD-10-CM | POA: Diagnosis not present

## 2018-06-23 DIAGNOSIS — R1310 Dysphagia, unspecified: Secondary | ICD-10-CM | POA: Diagnosis not present

## 2018-06-23 DIAGNOSIS — I69354 Hemiplegia and hemiparesis following cerebral infarction affecting left non-dominant side: Secondary | ICD-10-CM | POA: Diagnosis not present

## 2018-06-23 DIAGNOSIS — Z86011 Personal history of benign neoplasm of the brain: Secondary | ICD-10-CM | POA: Diagnosis not present

## 2018-06-23 DIAGNOSIS — G40909 Epilepsy, unspecified, not intractable, without status epilepticus: Secondary | ICD-10-CM | POA: Diagnosis not present

## 2018-06-23 DIAGNOSIS — E119 Type 2 diabetes mellitus without complications: Secondary | ICD-10-CM | POA: Diagnosis not present

## 2018-06-23 DIAGNOSIS — I69391 Dysphagia following cerebral infarction: Secondary | ICD-10-CM | POA: Diagnosis not present

## 2018-06-23 DIAGNOSIS — I69393 Ataxia following cerebral infarction: Secondary | ICD-10-CM | POA: Diagnosis not present

## 2018-06-23 MED ORDER — INDOMETHACIN 50 MG PO CAPS: 50.0000 mg | ORAL_CAPSULE | Freq: Two times a day (BID) | ORAL | 0 refills | Status: DC

## 2018-06-23 NOTE — Telephone Encounter (Signed)
[name] from [facility] called stating that the pt is having a lot of pain in the R knee, non affected side of the stroke and it may be possible Gout. She is not able to do the PT due to the pt being in a lot of pain. Knee has a lot of inflammation. Please advise.

## 2018-06-23 NOTE — Telephone Encounter (Signed)
I called and talk with the therapist, Jalene Mullet.  The patient began having right knee pain and swelling and warmth 2 days ago, is taken Tylenol without benefit.  He apparently has a prior history of gout.  I will give him a several day course of indomethacin to see if this improves the pain, if it does not he will need to call his primary care physician.

## 2018-06-23 NOTE — Telephone Encounter (Addendum)
I reached out to the pt to discuss this message. He stated Monday night he started having right knee pain with mild swelling.  Pt states he has been taking otc tylenol but has not provided any relief. Pt was advised to try cold compresses to see if this would help with inflammation.  Pt reports a history of gout flare ups. I inquired if the pt had reached out to his PCP about this and he stated no. I advised I would send a message to Dr. Jannifer Franklin, but if issue is related to gout pcp should be contacted and he verbalized understanding.  Lvm for Jalene Mullet advising I have received her message.

## 2018-06-25 DIAGNOSIS — I10 Essential (primary) hypertension: Secondary | ICD-10-CM | POA: Diagnosis not present

## 2018-06-25 DIAGNOSIS — I69322 Dysarthria following cerebral infarction: Secondary | ICD-10-CM | POA: Diagnosis not present

## 2018-06-25 DIAGNOSIS — Z86011 Personal history of benign neoplasm of the brain: Secondary | ICD-10-CM | POA: Diagnosis not present

## 2018-06-25 DIAGNOSIS — I69391 Dysphagia following cerebral infarction: Secondary | ICD-10-CM | POA: Diagnosis not present

## 2018-06-25 DIAGNOSIS — I69354 Hemiplegia and hemiparesis following cerebral infarction affecting left non-dominant side: Secondary | ICD-10-CM | POA: Diagnosis not present

## 2018-06-25 DIAGNOSIS — G40909 Epilepsy, unspecified, not intractable, without status epilepticus: Secondary | ICD-10-CM | POA: Diagnosis not present

## 2018-06-25 DIAGNOSIS — I69393 Ataxia following cerebral infarction: Secondary | ICD-10-CM | POA: Diagnosis not present

## 2018-06-25 DIAGNOSIS — E119 Type 2 diabetes mellitus without complications: Secondary | ICD-10-CM | POA: Diagnosis not present

## 2018-06-25 DIAGNOSIS — R1310 Dysphagia, unspecified: Secondary | ICD-10-CM | POA: Diagnosis not present

## 2018-06-28 DIAGNOSIS — Z86011 Personal history of benign neoplasm of the brain: Secondary | ICD-10-CM | POA: Diagnosis not present

## 2018-06-28 DIAGNOSIS — I69322 Dysarthria following cerebral infarction: Secondary | ICD-10-CM | POA: Diagnosis not present

## 2018-06-28 DIAGNOSIS — I69354 Hemiplegia and hemiparesis following cerebral infarction affecting left non-dominant side: Secondary | ICD-10-CM | POA: Diagnosis not present

## 2018-06-28 DIAGNOSIS — G40909 Epilepsy, unspecified, not intractable, without status epilepticus: Secondary | ICD-10-CM | POA: Diagnosis not present

## 2018-06-28 DIAGNOSIS — E119 Type 2 diabetes mellitus without complications: Secondary | ICD-10-CM | POA: Diagnosis not present

## 2018-06-28 DIAGNOSIS — I69391 Dysphagia following cerebral infarction: Secondary | ICD-10-CM | POA: Diagnosis not present

## 2018-06-28 DIAGNOSIS — I10 Essential (primary) hypertension: Secondary | ICD-10-CM | POA: Diagnosis not present

## 2018-06-28 DIAGNOSIS — I69393 Ataxia following cerebral infarction: Secondary | ICD-10-CM | POA: Diagnosis not present

## 2018-06-28 DIAGNOSIS — R1310 Dysphagia, unspecified: Secondary | ICD-10-CM | POA: Diagnosis not present

## 2018-07-01 ENCOUNTER — Telehealth: Payer: Self-pay | Admitting: Neurology

## 2018-07-01 DIAGNOSIS — Z5181 Encounter for therapeutic drug level monitoring: Secondary | ICD-10-CM

## 2018-07-01 DIAGNOSIS — R1310 Dysphagia, unspecified: Secondary | ICD-10-CM | POA: Diagnosis not present

## 2018-07-01 DIAGNOSIS — I69359 Hemiplegia and hemiparesis following cerebral infarction affecting unspecified side: Secondary | ICD-10-CM

## 2018-07-01 DIAGNOSIS — I69322 Dysarthria following cerebral infarction: Secondary | ICD-10-CM | POA: Diagnosis not present

## 2018-07-01 DIAGNOSIS — I10 Essential (primary) hypertension: Secondary | ICD-10-CM | POA: Diagnosis not present

## 2018-07-01 DIAGNOSIS — I69393 Ataxia following cerebral infarction: Secondary | ICD-10-CM | POA: Diagnosis not present

## 2018-07-01 DIAGNOSIS — I69391 Dysphagia following cerebral infarction: Secondary | ICD-10-CM | POA: Diagnosis not present

## 2018-07-01 DIAGNOSIS — R269 Unspecified abnormalities of gait and mobility: Secondary | ICD-10-CM

## 2018-07-01 DIAGNOSIS — I69354 Hemiplegia and hemiparesis following cerebral infarction affecting left non-dominant side: Secondary | ICD-10-CM | POA: Diagnosis not present

## 2018-07-01 DIAGNOSIS — Z86011 Personal history of benign neoplasm of the brain: Secondary | ICD-10-CM | POA: Diagnosis not present

## 2018-07-01 DIAGNOSIS — I69328 Other speech and language deficits following cerebral infarction: Secondary | ICD-10-CM

## 2018-07-01 DIAGNOSIS — G40909 Epilepsy, unspecified, not intractable, without status epilepticus: Secondary | ICD-10-CM | POA: Diagnosis not present

## 2018-07-01 DIAGNOSIS — E119 Type 2 diabetes mellitus without complications: Secondary | ICD-10-CM | POA: Diagnosis not present

## 2018-07-01 NOTE — Telephone Encounter (Signed)
PT Charis called needing a VO for 1 week 4 times, Request for Speech Therapy Eval. 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 Please advise.

## 2018-07-01 NOTE — Telephone Encounter (Signed)
Called the PT and just confirmed what she needed. I have sent the orders over for the patient and will make sure our team is aware

## 2018-07-01 NOTE — Telephone Encounter (Signed)
Orders sent to [facility] . Patient already established.

## 2018-07-06 ENCOUNTER — Other Ambulatory Visit: Payer: Self-pay

## 2018-07-06 ENCOUNTER — Encounter
Payer: BC Managed Care – PPO | Attending: Physical Medicine & Rehabilitation | Admitting: Physical Medicine & Rehabilitation

## 2018-07-06 ENCOUNTER — Encounter: Payer: Self-pay | Admitting: Physical Medicine & Rehabilitation

## 2018-07-06 VITALS — BP 110/72 | HR 81 | Temp 98.6°F | Ht 63.0 in | Wt 220.0 lb

## 2018-07-06 DIAGNOSIS — R1310 Dysphagia, unspecified: Secondary | ICD-10-CM | POA: Diagnosis not present

## 2018-07-06 DIAGNOSIS — I69391 Dysphagia following cerebral infarction: Secondary | ICD-10-CM | POA: Diagnosis not present

## 2018-07-06 DIAGNOSIS — E785 Hyperlipidemia, unspecified: Secondary | ICD-10-CM | POA: Diagnosis not present

## 2018-07-06 DIAGNOSIS — E119 Type 2 diabetes mellitus without complications: Secondary | ICD-10-CM | POA: Diagnosis not present

## 2018-07-06 DIAGNOSIS — I69398 Other sequelae of cerebral infarction: Secondary | ICD-10-CM | POA: Diagnosis not present

## 2018-07-06 DIAGNOSIS — Z8249 Family history of ischemic heart disease and other diseases of the circulatory system: Secondary | ICD-10-CM | POA: Insufficient documentation

## 2018-07-06 DIAGNOSIS — G8114 Spastic hemiplegia affecting left nondominant side: Secondary | ICD-10-CM

## 2018-07-06 DIAGNOSIS — I69354 Hemiplegia and hemiparesis following cerebral infarction affecting left non-dominant side: Secondary | ICD-10-CM | POA: Insufficient documentation

## 2018-07-06 DIAGNOSIS — F329 Major depressive disorder, single episode, unspecified: Secondary | ICD-10-CM | POA: Diagnosis not present

## 2018-07-06 DIAGNOSIS — Z85048 Personal history of other malignant neoplasm of rectum, rectosigmoid junction, and anus: Secondary | ICD-10-CM | POA: Insufficient documentation

## 2018-07-06 DIAGNOSIS — Z79899 Other long term (current) drug therapy: Secondary | ICD-10-CM | POA: Insufficient documentation

## 2018-07-06 DIAGNOSIS — G40909 Epilepsy, unspecified, not intractable, without status epilepticus: Secondary | ICD-10-CM | POA: Diagnosis not present

## 2018-07-06 DIAGNOSIS — I10 Essential (primary) hypertension: Secondary | ICD-10-CM | POA: Insufficient documentation

## 2018-07-06 DIAGNOSIS — R569 Unspecified convulsions: Secondary | ICD-10-CM | POA: Diagnosis not present

## 2018-07-06 DIAGNOSIS — Z96642 Presence of left artificial hip joint: Secondary | ICD-10-CM | POA: Diagnosis not present

## 2018-07-06 DIAGNOSIS — Z86011 Personal history of benign neoplasm of the brain: Secondary | ICD-10-CM | POA: Diagnosis not present

## 2018-07-06 DIAGNOSIS — I69322 Dysarthria following cerebral infarction: Secondary | ICD-10-CM | POA: Diagnosis not present

## 2018-07-06 DIAGNOSIS — I739 Peripheral vascular disease, unspecified: Secondary | ICD-10-CM | POA: Diagnosis not present

## 2018-07-06 DIAGNOSIS — N3289 Other specified disorders of bladder: Secondary | ICD-10-CM | POA: Insufficient documentation

## 2018-07-06 DIAGNOSIS — I69393 Ataxia following cerebral infarction: Secondary | ICD-10-CM | POA: Diagnosis not present

## 2018-07-06 DIAGNOSIS — Z7982 Long term (current) use of aspirin: Secondary | ICD-10-CM | POA: Insufficient documentation

## 2018-07-06 DIAGNOSIS — Z823 Family history of stroke: Secondary | ICD-10-CM | POA: Diagnosis not present

## 2018-07-06 NOTE — Progress Notes (Signed)
Subjective:    Patient ID: [name], male    DOB: 1965/06/13, 53 y.o.   MRN: [id number]  HPI No bruising or c/o post Botox injection 6 wks ago  Able to cut fingernails and donn, hand/wrist orthosis  No new medical illnesses Pain Inventory Average Pain 0 Pain Right Now 0 My pain is na  In the last 24 hours, has pain interfered with the following? General activity 0 Relation with others 0 Enjoyment of life 0 What TIME of day is your pain at its worst? na Sleep (in general) Good  Pain is worse with: na Pain improves with: na Relief from Meds: 0  Mobility walk with assistance use a walker ability to climb steps?  no do you drive?  no use a wheelchair needs help with transfers  Function disabled: date disabled . I need assistance with the following:  meal prep, household duties and shopping  Neuro/Psych weakness trouble walking  Prior Studies Any changes since last visit?  no  Physicians involved in your care Any changes since last visit?  no   Family History  Problem Relation Age of Onset  . Hypertension Mother   . Heart disease Mother   . Hyperlipidemia Mother   . Heart failure Mother   . Hypertension Sister   . Hypertension Brother   . Stroke Father   . Hypertension Father   . Colon cancer Maternal Grandmother   . Diabetes Maternal Grandmother    Social History   Socioeconomic History  . Marital status: Married    Spouse name: [name]  . Number of children: 1  . Years of education: 33  . Highest education level: Not on file  Occupational History  . Occupation: Group Home  Social Needs  . Financial resource strain: Not on file  . Food insecurity    Worry: Not on file    Inability: Not on file  . Transportation needs    Medical: Not on file    Non-medical: Not on file  Tobacco Use  . Smoking status: Never Smoker  . Smokeless tobacco: Never Used  Substance and Sexual Activity  . Alcohol use: Yes    Frequency: Never   Comment: socially  . Drug use: No  . Sexual activity: Not on file  Lifestyle  . Physical activity    Days per week: Not on file    Minutes per session: Not on file  . Stress: Not on file  Relationships  . Social Herbalist on phone: Not on file    Gets together: Not on file    Attends religious service: Not on file    Active member of club or organization: Not on file    Attends meetings of clubs or organizations: Not on file    Relationship status: Not on file  Other Topics Concern  . Not on file  Social History Narrative   Patient drinks caffeine a couple times a week.   Patient is right handed.   Lives with wife [name]) and daughter   Past Surgical History:  Procedure Laterality Date  . APPENDECTOMY    . JOINT REPLACEMENT Left 2004   Left hip  . TOTAL HIP ARTHROPLASTY     Past Medical History:  Diagnosis Date  . Colon cancer ([place])    rectal  . Diabetes mellitus without complication ([place])   . Gait abnormality 03/31/2018  . Hemiparesis and speech and language deficit as late effects of stroke ([place]) 03/31/2018  .  Hypertension   . Seizure ([place])   . Stroke ([place]) 2018   There were no vitals taken for this visit.  Opioid Risk Score:   Fall Risk Score:  `1  Depression screen PHQ 2/9  Depression screen [facility] 2/9 01/26/2018 09/28/2017 06/30/2017 05/11/2017 03/31/2017 02/05/2017  Decreased Interest 0 0 0 0 0 0  Down, Depressed, Hopeless 0 0 0 0 0 0  PHQ - 2 Score 0 0 0 0 0 0     Review of Systems  Constitutional: Negative.   HENT: Negative.   Eyes: Negative.   Respiratory: Negative.   Cardiovascular: Negative.   Gastrointestinal: Negative.   Endocrine: Negative.   Genitourinary: Negative.   Musculoskeletal: Positive for gait problem.  Skin: Negative.   Allergic/Immunologic: Negative.   Neurological: Positive for weakness.  Hematological: Negative.   Psychiatric/Behavioral: Negative.   All other systems reviewed and are negative.      Objective:    Physical Exam Vitals signs and nursing note reviewed.  Constitutional:      Appearance: Normal appearance.  HENT:     Mouth/Throat:     Mouth: Mucous membranes are moist.  Eyes:     Extraocular Movements: Extraocular movements intact.     Pupils: Pupils are equal, round, and reactive to light.  Musculoskeletal:     Comments: Mild Left 2nd DIP, PIP contracture  Neurological:     Mental Status: He is alert.     Comments: Tone  MAS 3 in finger flexors Left 2,3,4 MAS 1 at Left 5th dig MAS 2-3 THumb flexor    Hoarse voice  Motor 3- delt, 2- Bicep, triceps, 0 finger ext, 2- finger flexor       Assessment & Plan:  1.  Left spastic hemiparesis due to brainstem CVA Despite higher doses in finger flexors still very spastic May do better with Xeomin.  Plan to switch next injection  FCR 50 FCU 50 FPL 50 FDP 50 FDS 50 PT 50

## 2018-07-06 NOTE — Patient Instructions (Signed)
IncobotulinumtoxinA injection What is this medicine? INCOBOTULINUMTOXINA (IN koh BOT ue LYE num TOX in AY) is a neuro-muscular blocker. This medicine is used to treat eyelid, neck muscle, and hand and arm muscle spasms. It is also used to decrease drooling and to treat frown lines on the face. This medicine may be used for other purposes; ask your health care provider or pharmacist if you have questions. COMMON BRAND NAME(S): Xeomin What should I tell my health care provider before I take this medicine? They need to know if you have any of these conditions: -bleeding disorders -cerebral palsy -difficulty swallowing -history of surgery where this medicine is going to be used -infection where this medicine is going to be used -lung or breathing disease, like asthma -myasthenia gravis or other neurologic disease -nerve or muscle disease -surgery plans -an unusual or allergic reaction to botulinum toxin, albumin, sucrose, other medicines, foods, dyes, or preservatives -pregnant or trying to get pregnant -breast-feeding How should I use this medicine? This medicine is for injection into a muscle. It is given by a health care professional in a hospital or clinic setting. Talk to your pediatrician regarding the use of this medicine in children. This medicine is not approved for use in children. A special MedGuide will be given to you before each treatment. Be sure to read this information carefully each time. Overdosage: If you think you have taken too much of this medicine contact a poison control center or emergency room at once. NOTE: This medicine is only for you. Do not share this medicine with others. What if I miss a dose? This does not apply. What may interact with this medicine? -aminoglycoside antibiotics like gentamicin, neomycin, tobramycin -antihistamines for allergy, cough and cold -atropine -certain medicines for bladder problems like oxybutynin, tolterodine -certain medicines  for Parkinson's disease like benztropine, trihexyphenidyl -certain medicines for sleep -certain medicines for stomach problems like dicyclomine, hyoscyamine -certain medicines for travel sickness like scopolamine -certain medicines that treat or prevent blood clots like warfarin, enoxaparin, and dalteparin -ipratropium -muscle relaxants -other botulinum toxin injections This list may not describe all possible interactions. Give your health care provider a list of all the medicines, herbs, non-prescription drugs, or dietary supplements you use. Also tell them if you smoke, drink alcohol, or use illegal drugs. Some items may interact with your medicine. What should I watch for while using this medicine? Visit your doctor for regular check ups. This medicine will cause weakness in the muscle where it is injected. Tell your doctor if you feel unusually weak in other muscles. Get medical help right away if you have problems with breathing, swallowing, or talking. This medicine contains albumin from human blood. It may be possible to pass an infection in this medicine, but no cases have been reported. Talk to your doctor about the risks and benefits of this medicine. If your activities have been limited by your condition, go back to your regular routine slowly after treatment with this medicine. You may get muscle weakness, blurred vision, or drooping eyelids. If this happens, do not drive, use machinery, or do other dangerous activities. What side effects may I notice from receiving this medicine? Side effects that you should report to your doctor or health care professional as soon as possible: -allergic reactions like skin rash, itching or hives, swelling of the face, lips, or tongue -breathing problems -changes in vision -eye irritation -infection -numbness -speech problems -swallowing problems -trouble passing urine or change in the amount of urine Side effects   that usually do not require  medical attention (report to your doctor or health care professional if they continue or are bothersome): -bruising or pain at site where injected -drooping eyelid -dry eyes or mouth -headache -muscle aches, pains This list may not describe all possible side effects. Call your doctor for medical advice about side effects. You may report side effects to FDA at 1-800-FDA-1088. Where should I keep my medicine? This drug is given in a hospital or clinic and will not be stored at home. NOTE: This sheet is a summary. It may not cover all possible information. If you have questions about this medicine, talk to your doctor, pharmacist, or health care provider.  2019 Elsevier/Gold Standard (2016-07-21 11:43:52)  

## 2018-07-07 ENCOUNTER — Telehealth: Payer: Self-pay | Admitting: Neurology

## 2018-07-07 DIAGNOSIS — I10 Essential (primary) hypertension: Secondary | ICD-10-CM | POA: Diagnosis not present

## 2018-07-07 DIAGNOSIS — G40909 Epilepsy, unspecified, not intractable, without status epilepticus: Secondary | ICD-10-CM | POA: Diagnosis not present

## 2018-07-07 DIAGNOSIS — I69393 Ataxia following cerebral infarction: Secondary | ICD-10-CM | POA: Diagnosis not present

## 2018-07-07 DIAGNOSIS — I69322 Dysarthria following cerebral infarction: Secondary | ICD-10-CM | POA: Diagnosis not present

## 2018-07-07 DIAGNOSIS — E119 Type 2 diabetes mellitus without complications: Secondary | ICD-10-CM | POA: Diagnosis not present

## 2018-07-07 DIAGNOSIS — R1310 Dysphagia, unspecified: Secondary | ICD-10-CM | POA: Diagnosis not present

## 2018-07-07 DIAGNOSIS — Z86011 Personal history of benign neoplasm of the brain: Secondary | ICD-10-CM | POA: Diagnosis not present

## 2018-07-07 DIAGNOSIS — I69354 Hemiplegia and hemiparesis following cerebral infarction affecting left non-dominant side: Secondary | ICD-10-CM | POA: Diagnosis not present

## 2018-07-07 DIAGNOSIS — I69391 Dysphagia following cerebral infarction: Secondary | ICD-10-CM | POA: Diagnosis not present

## 2018-07-07 NOTE — Telephone Encounter (Signed)
lvm for Merleen Nicely providing verbal order requested. [facility] # provided if call back is needed.

## 2018-07-07 NOTE — Telephone Encounter (Signed)
Speech Therapist Kelsey @ [facility] has called requesting the frequency of 1 time a week for 1 week, 2 times a week for 1 week and 1 time a week for 2 weeks

## 2018-07-12 DIAGNOSIS — I69322 Dysarthria following cerebral infarction: Secondary | ICD-10-CM | POA: Diagnosis not present

## 2018-07-12 DIAGNOSIS — I10 Essential (primary) hypertension: Secondary | ICD-10-CM | POA: Diagnosis not present

## 2018-07-12 DIAGNOSIS — E119 Type 2 diabetes mellitus without complications: Secondary | ICD-10-CM | POA: Diagnosis not present

## 2018-07-12 DIAGNOSIS — R1310 Dysphagia, unspecified: Secondary | ICD-10-CM | POA: Diagnosis not present

## 2018-07-12 DIAGNOSIS — G40909 Epilepsy, unspecified, not intractable, without status epilepticus: Secondary | ICD-10-CM | POA: Diagnosis not present

## 2018-07-12 DIAGNOSIS — I69393 Ataxia following cerebral infarction: Secondary | ICD-10-CM | POA: Diagnosis not present

## 2018-07-12 DIAGNOSIS — I69391 Dysphagia following cerebral infarction: Secondary | ICD-10-CM | POA: Diagnosis not present

## 2018-07-12 DIAGNOSIS — I69354 Hemiplegia and hemiparesis following cerebral infarction affecting left non-dominant side: Secondary | ICD-10-CM | POA: Diagnosis not present

## 2018-07-12 DIAGNOSIS — Z86011 Personal history of benign neoplasm of the brain: Secondary | ICD-10-CM | POA: Diagnosis not present

## 2018-07-14 DIAGNOSIS — I69393 Ataxia following cerebral infarction: Secondary | ICD-10-CM | POA: Diagnosis not present

## 2018-07-14 DIAGNOSIS — I69391 Dysphagia following cerebral infarction: Secondary | ICD-10-CM | POA: Diagnosis not present

## 2018-07-14 DIAGNOSIS — E119 Type 2 diabetes mellitus without complications: Secondary | ICD-10-CM | POA: Diagnosis not present

## 2018-07-14 DIAGNOSIS — G40909 Epilepsy, unspecified, not intractable, without status epilepticus: Secondary | ICD-10-CM | POA: Diagnosis not present

## 2018-07-14 DIAGNOSIS — Z86011 Personal history of benign neoplasm of the brain: Secondary | ICD-10-CM | POA: Diagnosis not present

## 2018-07-14 DIAGNOSIS — R1310 Dysphagia, unspecified: Secondary | ICD-10-CM | POA: Diagnosis not present

## 2018-07-14 DIAGNOSIS — I10 Essential (primary) hypertension: Secondary | ICD-10-CM | POA: Diagnosis not present

## 2018-07-14 DIAGNOSIS — I69354 Hemiplegia and hemiparesis following cerebral infarction affecting left non-dominant side: Secondary | ICD-10-CM | POA: Diagnosis not present

## 2018-07-14 DIAGNOSIS — I69322 Dysarthria following cerebral infarction: Secondary | ICD-10-CM | POA: Diagnosis not present

## 2018-07-15 ENCOUNTER — Other Ambulatory Visit: Payer: Self-pay | Admitting: Neurology

## 2018-07-15 ENCOUNTER — Telehealth: Payer: Self-pay | Admitting: Neurology

## 2018-07-15 ENCOUNTER — Other Ambulatory Visit: Payer: Self-pay | Admitting: Internal Medicine

## 2018-07-15 MED ORDER — PREDNISONE 20 MG PO TABS: ORAL_TABLET | ORAL | 0 refills | Status: DC

## 2018-07-15 NOTE — Telephone Encounter (Signed)
Pt wife has called asking for a refill on pt's gout medication(she nor pt recalls the name of the medication) please send the refill to [id number]

## 2018-07-15 NOTE — Telephone Encounter (Signed)
1) Medication(s) Requested (by name): Gout medication (pt doesn't remember name of med)  2) Pharmacy of Choice: [id number] - [place], [place]   3) Special Requests:   Approved medications will be sent to the pharmacy, we will reach out if there is an issue.  Requests made after 3pm may not be addressed until the following business day!  If a patient is unsure of the name of the medication(s) please note and ask patient to call back when they are able to provide all info, do not send to responsible party until all information is available!

## 2018-07-15 NOTE — Telephone Encounter (Signed)
I received this request about needing medication for gout.  I reviewed patient's chart.  I do not see a history of gout on the chart.  I read the note in the chart from Dr. Jannifer Franklin entered 06/23/2018 that patient had called complaining of right knee pain and swelling and warmth and gave a prior history of gout.  He had given him some Indocin to use as needed and told him if not better to follow-up with his PCP. I called the patient this evening.  I got a voicemail.  I left a message stating that I was not aware of the history of gout.  However I am sending a prescription to his pharmacy for some prednisone.  If symptom does not resolve with the prednisone then he needs to be seen.

## 2018-07-15 NOTE — Telephone Encounter (Signed)
Patients call returned.  Patient identified by name and date of birth.  Patient is out of his gout medicine which this Nurse assumed is Indocin.  Patient is requesting refill.  Patient advised that PCP would be informed.  Patient acknowledged understanding of advice.

## 2018-07-15 NOTE — Telephone Encounter (Signed)
I reached out to pt's wife and advised to contact pcp for this refill.

## 2018-07-15 NOTE — Addendum Note (Signed)
Addended by: Karle Plumber B on: 07/15/2018 06:45 PM   Modules accepted: Orders

## 2018-07-16 DIAGNOSIS — I69393 Ataxia following cerebral infarction: Secondary | ICD-10-CM | POA: Diagnosis not present

## 2018-07-16 DIAGNOSIS — R1310 Dysphagia, unspecified: Secondary | ICD-10-CM | POA: Diagnosis not present

## 2018-07-16 DIAGNOSIS — Z86011 Personal history of benign neoplasm of the brain: Secondary | ICD-10-CM | POA: Diagnosis not present

## 2018-07-16 DIAGNOSIS — I69322 Dysarthria following cerebral infarction: Secondary | ICD-10-CM | POA: Diagnosis not present

## 2018-07-16 DIAGNOSIS — E119 Type 2 diabetes mellitus without complications: Secondary | ICD-10-CM | POA: Diagnosis not present

## 2018-07-16 DIAGNOSIS — I10 Essential (primary) hypertension: Secondary | ICD-10-CM | POA: Diagnosis not present

## 2018-07-16 DIAGNOSIS — I69354 Hemiplegia and hemiparesis following cerebral infarction affecting left non-dominant side: Secondary | ICD-10-CM | POA: Diagnosis not present

## 2018-07-16 DIAGNOSIS — G40909 Epilepsy, unspecified, not intractable, without status epilepticus: Secondary | ICD-10-CM | POA: Diagnosis not present

## 2018-07-16 DIAGNOSIS — I69391 Dysphagia following cerebral infarction: Secondary | ICD-10-CM | POA: Diagnosis not present

## 2018-07-16 MED ORDER — INDOMETHACIN 50 MG PO CAPS: 50.0000 mg | ORAL_CAPSULE | Freq: Two times a day (BID) | ORAL | 0 refills | Status: DC

## 2018-07-19 DIAGNOSIS — I10 Essential (primary) hypertension: Secondary | ICD-10-CM | POA: Diagnosis not present

## 2018-07-19 DIAGNOSIS — I69354 Hemiplegia and hemiparesis following cerebral infarction affecting left non-dominant side: Secondary | ICD-10-CM | POA: Diagnosis not present

## 2018-07-19 DIAGNOSIS — R1310 Dysphagia, unspecified: Secondary | ICD-10-CM | POA: Diagnosis not present

## 2018-07-19 DIAGNOSIS — I69322 Dysarthria following cerebral infarction: Secondary | ICD-10-CM | POA: Diagnosis not present

## 2018-07-19 DIAGNOSIS — I69391 Dysphagia following cerebral infarction: Secondary | ICD-10-CM | POA: Diagnosis not present

## 2018-07-19 DIAGNOSIS — I69393 Ataxia following cerebral infarction: Secondary | ICD-10-CM | POA: Diagnosis not present

## 2018-07-19 DIAGNOSIS — Z86011 Personal history of benign neoplasm of the brain: Secondary | ICD-10-CM | POA: Diagnosis not present

## 2018-07-19 DIAGNOSIS — G40909 Epilepsy, unspecified, not intractable, without status epilepticus: Secondary | ICD-10-CM | POA: Diagnosis not present

## 2018-07-19 DIAGNOSIS — E119 Type 2 diabetes mellitus without complications: Secondary | ICD-10-CM | POA: Diagnosis not present

## 2018-07-20 ENCOUNTER — Telehealth: Payer: Self-pay | Admitting: Emergency Medicine

## 2018-07-20 NOTE — Telephone Encounter (Signed)
Patients call returned.  Patient identified by name and date of birth.  Patient called to assure triage Nurse that patients situation had been resolved.  Patient stated that he had received his medication.  Patient acknowledged understanding of advice.

## 2018-07-21 DIAGNOSIS — Z86011 Personal history of benign neoplasm of the brain: Secondary | ICD-10-CM | POA: Diagnosis not present

## 2018-07-21 DIAGNOSIS — I69393 Ataxia following cerebral infarction: Secondary | ICD-10-CM | POA: Diagnosis not present

## 2018-07-21 DIAGNOSIS — E119 Type 2 diabetes mellitus without complications: Secondary | ICD-10-CM | POA: Diagnosis not present

## 2018-07-21 DIAGNOSIS — I69322 Dysarthria following cerebral infarction: Secondary | ICD-10-CM | POA: Diagnosis not present

## 2018-07-21 DIAGNOSIS — I69391 Dysphagia following cerebral infarction: Secondary | ICD-10-CM | POA: Diagnosis not present

## 2018-07-21 DIAGNOSIS — G40909 Epilepsy, unspecified, not intractable, without status epilepticus: Secondary | ICD-10-CM | POA: Diagnosis not present

## 2018-07-21 DIAGNOSIS — R1310 Dysphagia, unspecified: Secondary | ICD-10-CM | POA: Diagnosis not present

## 2018-07-21 DIAGNOSIS — I10 Essential (primary) hypertension: Secondary | ICD-10-CM | POA: Diagnosis not present

## 2018-07-21 DIAGNOSIS — I69354 Hemiplegia and hemiparesis following cerebral infarction affecting left non-dominant side: Secondary | ICD-10-CM | POA: Diagnosis not present

## 2018-07-26 DIAGNOSIS — I69393 Ataxia following cerebral infarction: Secondary | ICD-10-CM | POA: Diagnosis not present

## 2018-07-26 DIAGNOSIS — I10 Essential (primary) hypertension: Secondary | ICD-10-CM | POA: Diagnosis not present

## 2018-07-26 DIAGNOSIS — I69391 Dysphagia following cerebral infarction: Secondary | ICD-10-CM | POA: Diagnosis not present

## 2018-07-26 DIAGNOSIS — E119 Type 2 diabetes mellitus without complications: Secondary | ICD-10-CM | POA: Diagnosis not present

## 2018-07-26 DIAGNOSIS — G40909 Epilepsy, unspecified, not intractable, without status epilepticus: Secondary | ICD-10-CM | POA: Diagnosis not present

## 2018-07-26 DIAGNOSIS — I69322 Dysarthria following cerebral infarction: Secondary | ICD-10-CM | POA: Diagnosis not present

## 2018-07-26 DIAGNOSIS — I69354 Hemiplegia and hemiparesis following cerebral infarction affecting left non-dominant side: Secondary | ICD-10-CM | POA: Diagnosis not present

## 2018-07-26 DIAGNOSIS — Z86011 Personal history of benign neoplasm of the brain: Secondary | ICD-10-CM | POA: Diagnosis not present

## 2018-07-26 DIAGNOSIS — R1310 Dysphagia, unspecified: Secondary | ICD-10-CM | POA: Diagnosis not present

## 2018-08-04 ENCOUNTER — Other Ambulatory Visit: Payer: Self-pay | Admitting: Physical Medicine & Rehabilitation

## 2018-08-04 ENCOUNTER — Other Ambulatory Visit: Payer: Self-pay | Admitting: Internal Medicine

## 2018-08-27 ENCOUNTER — Encounter: Payer: BC Managed Care – PPO | Admitting: Physical Medicine & Rehabilitation

## 2018-09-14 ENCOUNTER — Other Ambulatory Visit: Payer: Self-pay | Admitting: Internal Medicine

## 2018-09-14 DIAGNOSIS — I69359 Hemiplegia and hemiparesis following cerebral infarction affecting unspecified side: Secondary | ICD-10-CM

## 2018-09-16 ENCOUNTER — Other Ambulatory Visit: Payer: Self-pay

## 2018-09-16 ENCOUNTER — Encounter
Payer: BC Managed Care – PPO | Attending: Physical Medicine & Rehabilitation | Admitting: Physical Medicine & Rehabilitation

## 2018-09-16 VITALS — BP 121/77 | HR 82 | Temp 97.3°F

## 2018-09-16 DIAGNOSIS — Z79899 Other long term (current) drug therapy: Secondary | ICD-10-CM | POA: Diagnosis not present

## 2018-09-16 DIAGNOSIS — Z96642 Presence of left artificial hip joint: Secondary | ICD-10-CM | POA: Diagnosis not present

## 2018-09-16 DIAGNOSIS — G8114 Spastic hemiplegia affecting left nondominant side: Secondary | ICD-10-CM | POA: Diagnosis not present

## 2018-09-16 DIAGNOSIS — Z7982 Long term (current) use of aspirin: Secondary | ICD-10-CM | POA: Insufficient documentation

## 2018-09-16 DIAGNOSIS — I10 Essential (primary) hypertension: Secondary | ICD-10-CM | POA: Diagnosis not present

## 2018-09-16 DIAGNOSIS — I69398 Other sequelae of cerebral infarction: Secondary | ICD-10-CM | POA: Insufficient documentation

## 2018-09-16 DIAGNOSIS — I69354 Hemiplegia and hemiparesis following cerebral infarction affecting left non-dominant side: Secondary | ICD-10-CM | POA: Insufficient documentation

## 2018-09-16 DIAGNOSIS — Z823 Family history of stroke: Secondary | ICD-10-CM | POA: Insufficient documentation

## 2018-09-16 DIAGNOSIS — E785 Hyperlipidemia, unspecified: Secondary | ICD-10-CM | POA: Insufficient documentation

## 2018-09-16 DIAGNOSIS — N3289 Other specified disorders of bladder: Secondary | ICD-10-CM | POA: Diagnosis not present

## 2018-09-16 DIAGNOSIS — Z85048 Personal history of other malignant neoplasm of rectum, rectosigmoid junction, and anus: Secondary | ICD-10-CM | POA: Diagnosis not present

## 2018-09-16 DIAGNOSIS — R569 Unspecified convulsions: Secondary | ICD-10-CM | POA: Diagnosis not present

## 2018-09-16 DIAGNOSIS — F329 Major depressive disorder, single episode, unspecified: Secondary | ICD-10-CM | POA: Diagnosis not present

## 2018-09-16 DIAGNOSIS — I739 Peripheral vascular disease, unspecified: Secondary | ICD-10-CM | POA: Insufficient documentation

## 2018-09-16 DIAGNOSIS — E119 Type 2 diabetes mellitus without complications: Secondary | ICD-10-CM | POA: Diagnosis not present

## 2018-09-16 DIAGNOSIS — Z8249 Family history of ischemic heart disease and other diseases of the circulatory system: Secondary | ICD-10-CM | POA: Insufficient documentation

## 2018-09-16 NOTE — Progress Notes (Signed)
Xeomin Injection for spasticity using needle EMG guidance  Dilution: 50 Units/ml Indication: Severe spasticity which interferes with ADL,mobility and/or  hygiene and is unresponsive to medication management and other conservative care Informed consent was obtained after describing risks and benefits of the procedure with the patient. This includes bleeding, bruising, infection, excessive weakness, or medication side effects. A REMS form is on file and signed.  27g 1" needle electrode Number of units per muscle FCR 50 FCU 50 FPL 50 FDP 50 FDS 50 PT 50  All injections were done after obtaining appropriate EMG activity and after negative drawback for blood. The patient tolerated the procedure well. Post procedure instructions were given. A followup appointment was made.

## 2018-09-16 NOTE — Patient Instructions (Signed)
You received a Xeomin injection today. You may experience soreness at the needle injection sites. Please call us if any of the injection sites turns red after a couple days or if there is any drainage. You may experience muscle weakness as a result of Xeomin This would improve with time but can take several weeks to improve. The Xeomin should start working in about one week. The Xeomin usually last 3 months. The injection can be repeated every 3 months as needed.  

## 2018-09-21 ENCOUNTER — Other Ambulatory Visit: Payer: Self-pay | Admitting: Internal Medicine

## 2018-10-01 ENCOUNTER — Ambulatory Visit: Payer: BLUE CROSS/BLUE SHIELD | Admitting: Neurology

## 2018-10-11 NOTE — Progress Notes (Signed)
PATIENT: [name] DOB: [birth year]  REASON FOR VISIT: follow up HISTORY FROM: patient  HISTORY OF PRESENT ILLNESS: Today 10/12/18  [name] is a 53 year old male with history of a parafalcine meningioma, and history of seizures.  He has not had a seizure since 2016.  He has been off his medications for several years.  He suffered a stroke in November 2018 involving the right lower pons and upper medullary area.  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  He has residual dysarthria, and some dysphasia.  He lives at home with his wife.  He has been evaluated at this office for titubations and ataxia.  He completed physical and occupational therapy.  He says it has been great and has improved his daily function.  He says at home he is able to do some cooking, he is able to dress himself.  He is able to stand and pivot.  His wife works full-time.  He has not had any recent fall.  He recently saw Dr. Ella Bodo for botox injections for left arm spasticity.  He was scheduled to have MRI of the brain in April 2020.  This is yet to be completed, ordered by his primary doctor.  He was dropped off early to today's appointment by his wife.  He denies any new problems or concerns.  HISTORY 03/31/2018 Dr. Jannifer Franklin: [name] is a 53 year old right-handed black male with a history of a parafalcine meningioma, and a history of seizures.  The patient was last seen through this office around 07 July 2016.  The patient had not had a seizure since 2016, he had been off of his medications for a year prior to that last revisit.  The patient is still off of seizure medications, he has not had any recurrence.  He unfortunately suffered a stroke on 02 December 2016 involving the right lower pons and upper medullary area.  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   The patient has residual dysarthria, some dysphagia.  He is living at home with his wife, he has some aide and assistance coming in, he is being followed through rehab and is getting Botox injections regularly.  The patient has a history of diabetes and hypertension.  He claims that he is doing fairly well with his diabetes.  He comes in today for evaluation of the titubations and ataxia.  The patient is able to walk with a walker, he can walk up to 60 yards with a walker, he has not had any falls.  He denies issues controlling the bowels or the bladder.  He denies any numbness on the right or left sides of the body.  He denies double vision or loss of vision, he is able to read or watch TV without difficulty.  MRI of the brain has been ordered for April 2020.  The last MRI of the brain was done in December 2018.  REVIEW OF SYSTEMS: Out of a complete 14 system review of symptoms, the patient complains only of the following symptoms, and all other reviewed systems are negative.  Numbness, walking problems  ALLERGIES: No Known Allergies  HOME MEDICATIONS: Outpatient Medications Prior to Visit  Medication Sig Dispense Refill  . amLODipine (NORVASC) 10 MG tablet Take 1 tablet (10 mg total) by mouth daily. 30 tablet 11  . aspirin 325 MG tablet Take  1 tablet (325 mg total) by mouth daily. 30 tablet 0  . atorvastatin (LIPITOR) 80 MG tablet TAKE 1 TABLET BY MOUTH EVERY DAY 90 tablet 0  . carvedilol (COREG) 25 MG tablet TAKE 1 TABLET BY MOUTH TWICE A DAY WITH A MEAL 60 tablet 11  . FLUoxetine (PROZAC) 10 MG capsule Take 1 capsule (10 mg total) by mouth daily. 30 capsule 0  . indomethacin (INDOCIN) 50 MG capsule Take 1 capsule (50 mg total) by mouth 2 (two) times daily with a meal. 20 capsule 0  . losartan (COZAAR) 50 MG tablet Take 1 tablet (50 mg total) by mouth daily. 30 tablet 6  . Melatonin 3 MG TABS Take 3 tablets (9 mg total) by mouth at bedtime. 30 tablet 0  . olmesartan (BENICAR) 20 MG tablet TAKE  1 TABLET BY MOUTH EVERY DAY STOP LOSARTAN 90 tablet 0  . oxybutynin (DITROPAN) 5 MG tablet TAKE 1 TABLET BY MOUTH TWICE A DAY 180 tablet 0  . predniSONE (DELTASONE) 20 MG tablet 1-1/2 tablets p.o. daily x4 days then 1 tab PO daily x 3 days then 1/2 tab PO x 3 days (Patient not taking: Reported on 10/12/2018) 11 tablet 0   No facility-administered medications prior to visit.     PAST MEDICAL HISTORY: Past Medical History:  Diagnosis Date  . Colon cancer ([place])    rectal  . Diabetes mellitus without complication ([place])   . Gait abnormality 03/31/2018  . Hemiparesis and speech and language deficit as late effects of stroke ([place]) 03/31/2018  . Hypertension   . Seizure ([place])   . Stroke [facility]) 2018    PAST SURGICAL HISTORY: Past Surgical History:  Procedure Laterality Date  . APPENDECTOMY    . JOINT REPLACEMENT Left 2004   Left hip  . TOTAL HIP ARTHROPLASTY      FAMILY HISTORY: Family History  Problem Relation Age of Onset  . Hypertension Mother   . Heart disease Mother   . Hyperlipidemia Mother   . Heart failure Mother   . Hypertension Sister   . Hypertension Brother   . Stroke Father   . Hypertension Father   . Colon cancer Maternal Grandmother   . Diabetes Maternal Grandmother     SOCIAL HISTORY: Social History   Socioeconomic History  . Marital status: Married    Spouse name: [name]  . Number of children: 1  . Years of education: 51  . Highest education level: Not on file  Occupational History  . Occupation: Group Home  Social Needs  . Financial resource strain: Not on file  . Food insecurity    Worry: Not on file    Inability: Not on file  . Transportation needs    Medical: Not on file    Non-medical: Not on file  Tobacco Use  . Smoking status: Never Smoker  . Smokeless tobacco: Never Used  Substance and Sexual Activity  . Alcohol use: Yes    Frequency: Never    Comment: socially  . Drug use: No  . Sexual activity: Not on file  Lifestyle  . Physical  activity    Days per week: Not on file    Minutes per session: Not on file  . Stress: Not on file  Relationships  . Social Herbalist on phone: Not on file    Gets together: Not on file    Attends religious service: Not on file    Active member of club or organization: Not on file  Attends meetings of clubs or organizations: Not on file    Relationship status: Not on file  . Intimate partner violence    Fear of current or ex partner: Not on file    Emotionally abused: Not on file    Physically abused: Not on file    Forced sexual activity: Not on file  Other Topics Concern  . Not on file  Social History Narrative   Patient drinks caffeine a couple times a week.   Patient is right handed.   Lives with wife [name]) and daughter    PHYSICAL EXAM  Vitals:   10/12/18 1102  BP: 105/67  Pulse: 77  Temp: 98.6 F (37 C)   There is no height or weight on file to calculate BMI.  Generalized: Well developed, in no acute distress   Neurological examination  Mentation: Alert oriented to time, place, history taking. Follows all commands, speech is dysarthric Cranial nerve II-XII: Pupils were equal round reactive to light. Extraocular movements were full, visual field were full on confrontational test. Facial sensation and strength were normal.  Head turning and shoulder shrug were normal and symmetric. Motor: The motor testing reveals 5 over 5 strength of the right extremities, left side upper spasticity,3/5, left lower extremity 4/5, decreased grip with left hand Sensory: Sensory testing is intact to soft touch on all 4 extremities. No evidence of extinction is noted.  Coordination: Cerebellar testing reveals good finger-nose-finger on the right, difficulty performing on the left Gait and station: He is in a wheelchair, he was not ambulated today Reflexes: Deep tendon reflexes are symmetric but increased on the left arm and left leg  DIAGNOSTIC DATA (LABS, IMAGING,  TESTING) - I reviewed patient records, labs, notes, testing and imaging myself where available.  Lab Results  Component Value Date   WBC 4.6 11/23/2017   HGB 13.0 11/23/2017   HCT 37.4 (L) 11/23/2017   MCV 76 (L) 11/23/2017   PLT 235 11/23/2017      Component Value Date/Time   NA 144 11/23/2017 1706   K 4.0 11/23/2017 1706   CL 105 11/23/2017 1706   CO2 25 11/23/2017 1706   GLUCOSE 113 (H) 11/23/2017 1706   GLUCOSE 115 (H) 01/01/2017 0624   BUN 13 01/26/2018 1026   CREATININE 1.15 01/26/2018 1026   CALCIUM 9.5 11/23/2017 1706   PROT 6.9 11/23/2017 1706   ALBUMIN 4.3 11/23/2017 1706   AST 12 11/23/2017 1706   ALT 19 11/23/2017 1706   ALKPHOS 100 11/23/2017 1706   BILITOT 0.5 11/23/2017 1706   GFRNONAA 73 01/26/2018 1026   GFRAA 85 01/26/2018 1026   Lab Results  Component Value Date   CHOL 125 11/23/2017   HDL 38 (L) 11/23/2017   LDLCALC 65 11/23/2017   TRIG 111 11/23/2017   CHOLHDL 3.3 11/23/2017   Lab Results  Component Value Date   HGBA1C 6.3 11/23/2017   No results found for: VITAMINB12 No results found for: TSH  ASSESSMENT AND PLAN 53 y.o. year old male  has a past medical history of Colon cancer ([place]), Diabetes mellitus without complication ([place]), Gait abnormality (03/31/2018), Hemiparesis and speech and language deficit as late effects of stroke ([place]) (03/31/2018), Hypertension, Seizure ([place]), and Stroke ([place]) (2018). here with:  1.  History of parafalcine meningioma 2.  History of seizures, no recurrence is currently off medication 3.  Lower right pontine/upper medullary stroke 4.  Gait disturbance  He has continued to do fairly well, has especially benefited from physical therapy.  When last evaluated in office in March 2020 by Dr. Jannifer Franklin, he indicated the patient does not have a true tremor, he has titubations of the axial muscles.  He has now completed physical and occupational therapy.  He has found this to be very beneficial, and improve his independence  in the home.  He has not had recurrent seizure, while being off seizure medications.  He was scheduled to have repeat MRI of the brain in April 2020, but this was not completed (I will send a note to the primary doctor about this, as this was ordered by Dr. Wynetta Emery).  He will follow-up in this office in 6 months or sooner if needed.  I spent 15 minutes with the patient. 50% of this time was spent discussing his plan of care.  Butler Denmark, AGNP-C, DNP 10/12/2018, 11:11 AM [facility] [address], [place], [place] [address] [phone]

## 2018-10-12 ENCOUNTER — Ambulatory Visit (INDEPENDENT_AMBULATORY_CARE_PROVIDER_SITE_OTHER): Payer: BC Managed Care – PPO | Admitting: Neurology

## 2018-10-12 ENCOUNTER — Encounter: Payer: Self-pay | Admitting: Neurology

## 2018-10-12 ENCOUNTER — Other Ambulatory Visit: Payer: Self-pay

## 2018-10-12 VITALS — BP 105/67 | HR 77 | Temp 98.6°F

## 2018-10-12 DIAGNOSIS — R569 Unspecified convulsions: Secondary | ICD-10-CM | POA: Diagnosis not present

## 2018-10-12 DIAGNOSIS — I6350 Cerebral infarction due to unspecified occlusion or stenosis of unspecified cerebral artery: Secondary | ICD-10-CM

## 2018-10-12 DIAGNOSIS — R269 Unspecified abnormalities of gait and mobility: Secondary | ICD-10-CM | POA: Diagnosis not present

## 2018-10-12 NOTE — Progress Notes (Signed)
I have read the note, and I agree with the clinical assessment and plan.  [name] K [name]   

## 2018-10-12 NOTE — Patient Instructions (Signed)
It appears as if you have continued to do well. Please check with your primary about getting MRI of the brain, that was ordered in April. I am glad physical therapy has been helpful. We will see you back in 6 months

## 2018-10-13 ENCOUNTER — Other Ambulatory Visit: Payer: Self-pay | Admitting: Internal Medicine

## 2018-10-13 ENCOUNTER — Telehealth: Payer: Self-pay | Admitting: Internal Medicine

## 2018-10-13 NOTE — Telephone Encounter (Signed)
New Message  1) Medication(s) Requested (by name):  indomethacin (INDOCIN) 50 MG capsule  2) Pharmacy of Choice: [facility]  3) Special Requests: Pt requesting refills until appt on 10/8   Approved medications will be sent to the pharmacy, we will reach out if there is an issue.  Requests made after 3pm may not be addressed until the following business day!  If a patient is unsure of the name of the medication(s) please note and ask patient to call back when they are able to provide all info, do not send to responsible party until all information is available!

## 2018-10-13 NOTE — Telephone Encounter (Signed)
Pt will need to be seen on 10/8 appointment before this medication can be refilled.

## 2018-10-15 ENCOUNTER — Telehealth: Payer: Self-pay | Admitting: Internal Medicine

## 2018-10-15 DIAGNOSIS — Z86011 Personal history of benign neoplasm of the brain: Secondary | ICD-10-CM

## 2018-10-15 NOTE — Telephone Encounter (Signed)
-----   Message from Suzzanne Cloud, NP sent at 10/14/2018 11:12 AM EDT ----- Dr. Wynetta Emery,   I spoke with out staff member who scheduled MRI she said the one you ordered was approved, just the order needs to be put back in.  Thanks.  ----- Message ----- From: Ladell Pier, MD Sent: 10/13/2018   9:11 AM EDT To: Suzzanne Cloud, NP  Yes. I went through a long process with his insurance filling out a form and sending notes etc.  It was denied. You can try if you think the neurologist would be in a better position to justify need with his insurance.  Thanks. ----- Message ----- From: Suzzanne Cloud, NP Sent: 10/12/2018  11:47 AM EDT To: Ladell Pier, MD  Hi Dr. Wynetta Emery,   I had the pleasure of seeing one of our mutual patients today, [name]. It looks MRI of the brain was ordered to do be done in April 2020 by your office. I was just following up to ensure that would still be completed. We can assist if needed. I imagine it got put on hold during the COVID shut down. I reviewed the notes, look like you had a hard time getting it approved from insurance.  Thanks,  Judson Roch

## 2018-10-21 ENCOUNTER — Ambulatory Visit: Payer: BC Managed Care – PPO

## 2018-10-22 ENCOUNTER — Encounter: Payer: Self-pay | Admitting: Physical Medicine & Rehabilitation

## 2018-10-22 ENCOUNTER — Encounter
Payer: BC Managed Care – PPO | Attending: Physical Medicine & Rehabilitation | Admitting: Physical Medicine & Rehabilitation

## 2018-10-22 ENCOUNTER — Other Ambulatory Visit: Payer: Self-pay

## 2018-10-22 VITALS — BP 118/77 | HR 77 | Temp 97.3°F | Ht 64.0 in | Wt 210.0 lb

## 2018-10-22 DIAGNOSIS — G8114 Spastic hemiplegia affecting left nondominant side: Secondary | ICD-10-CM

## 2018-10-22 DIAGNOSIS — Z96642 Presence of left artificial hip joint: Secondary | ICD-10-CM | POA: Insufficient documentation

## 2018-10-22 DIAGNOSIS — F329 Major depressive disorder, single episode, unspecified: Secondary | ICD-10-CM | POA: Insufficient documentation

## 2018-10-22 DIAGNOSIS — Z7982 Long term (current) use of aspirin: Secondary | ICD-10-CM | POA: Diagnosis not present

## 2018-10-22 DIAGNOSIS — I69398 Other sequelae of cerebral infarction: Secondary | ICD-10-CM | POA: Insufficient documentation

## 2018-10-22 DIAGNOSIS — I739 Peripheral vascular disease, unspecified: Secondary | ICD-10-CM | POA: Insufficient documentation

## 2018-10-22 DIAGNOSIS — Z823 Family history of stroke: Secondary | ICD-10-CM | POA: Insufficient documentation

## 2018-10-22 DIAGNOSIS — I69354 Hemiplegia and hemiparesis following cerebral infarction affecting left non-dominant side: Secondary | ICD-10-CM | POA: Diagnosis not present

## 2018-10-22 DIAGNOSIS — I10 Essential (primary) hypertension: Secondary | ICD-10-CM | POA: Insufficient documentation

## 2018-10-22 DIAGNOSIS — Z85048 Personal history of other malignant neoplasm of rectum, rectosigmoid junction, and anus: Secondary | ICD-10-CM | POA: Diagnosis not present

## 2018-10-22 DIAGNOSIS — Z8249 Family history of ischemic heart disease and other diseases of the circulatory system: Secondary | ICD-10-CM | POA: Insufficient documentation

## 2018-10-22 DIAGNOSIS — N3289 Other specified disorders of bladder: Secondary | ICD-10-CM | POA: Diagnosis not present

## 2018-10-22 DIAGNOSIS — R569 Unspecified convulsions: Secondary | ICD-10-CM | POA: Diagnosis not present

## 2018-10-22 DIAGNOSIS — E785 Hyperlipidemia, unspecified: Secondary | ICD-10-CM | POA: Diagnosis not present

## 2018-10-22 DIAGNOSIS — E119 Type 2 diabetes mellitus without complications: Secondary | ICD-10-CM | POA: Diagnosis not present

## 2018-10-22 DIAGNOSIS — Z79899 Other long term (current) drug therapy: Secondary | ICD-10-CM | POA: Insufficient documentation

## 2018-10-22 NOTE — Progress Notes (Signed)
Subjective:    Patient ID: [name], male    DOB: 05-24-1965, 53 y.o.   MRN: [id number]  HPI  Xeomin injection 09/16/2018 FCR 50 FCU 50 FPL 50 FDP 50 FDS 50 PT 50  Pain Inventory Average Pain 0 Pain Right Now 0 My pain is na  In the last 24 hours, has pain interfered with the following? General activity 0 Relation with others 0 Enjoyment of life 0 What TIME of day is your pain at its worst? na Sleep (in general) Good  Pain is worse with: na Pain improves with: na Relief from Meds: na  Mobility walk with assistance use a cane ability to climb steps?  no do you drive?  no use a wheelchair  Function retired I need assistance with the following:  bathing, meal prep, household duties and shopping  Neuro/Psych weakness tremor trouble walking spasms dizziness  Prior Studies Any changes since last visit?  no  Physicians involved in your care Any changes since last visit?  no   Family History  Problem Relation Age of Onset  . Hypertension Mother   . Heart disease Mother   . Hyperlipidemia Mother   . Heart failure Mother   . Hypertension Sister   . Hypertension Brother   . Stroke Father   . Hypertension Father   . Colon cancer Maternal Grandmother   . Diabetes Maternal Grandmother    Social History   Socioeconomic History  . Marital status: Married    Spouse name: [name]  . Number of children: 1  . Years of education: 24  . Highest education level: Not on file  Occupational History  . Occupation: Group Home  Social Needs  . Financial resource strain: Not on file  . Food insecurity    Worry: Not on file    Inability: Not on file  . Transportation needs    Medical: Not on file    Non-medical: Not on file  Tobacco Use  . Smoking status: Never Smoker  . Smokeless tobacco: Never Used  Substance and Sexual Activity  . Alcohol use: Yes    Frequency: Never    Comment: socially  . Drug use: No  . Sexual activity: Not on file   Lifestyle  . Physical activity    Days per week: Not on file    Minutes per session: Not on file  . Stress: Not on file  Relationships  . Social Herbalist on phone: Not on file    Gets together: Not on file    Attends religious service: Not on file    Active member of club or organization: Not on file    Attends meetings of clubs or organizations: Not on file    Relationship status: Not on file  Other Topics Concern  . Not on file  Social History Narrative   Patient drinks caffeine a couple times a week.   Patient is right handed.   Lives with wife [name]) and daughter   Past Surgical History:  Procedure Laterality Date  . APPENDECTOMY    . JOINT REPLACEMENT Left 2004   Left hip  . TOTAL HIP ARTHROPLASTY     Past Medical History:  Diagnosis Date  . Colon cancer ([place])    rectal  . Diabetes mellitus without complication ([place])   . Gait abnormality 03/31/2018  . Hemiparesis and speech and language deficit as late effects of stroke ([place]) 03/31/2018  . Hypertension   . Seizure ([place])   .  Stroke ([place]) 2018   BP 118/77   Pulse 77   Temp (!) 97.3 F (36.3 C)   Ht 5\' 4"  (1.626 m)   Wt 210 lb (95.3 kg)   SpO2 97%   BMI 36.05 kg/m   Opioid Risk Score:   Fall Risk Score:  `1  Depression screen PHQ 2/9  Depression screen [facility] 2/9 01/26/2018 09/28/2017 06/30/2017 05/11/2017 03/31/2017 02/05/2017  Decreased Interest 0 0 0 0 0 0  Down, Depressed, Hopeless 0 0 0 0 0 0  PHQ - 2 Score 0 0 0 0 0 0     Review of Systems  Constitutional: Negative.   HENT: Negative.   Eyes: Negative.   Respiratory: Negative.   Cardiovascular: Negative.   Gastrointestinal: Negative.   Endocrine: Negative.   Genitourinary: Negative.   Musculoskeletal: Positive for gait problem and myalgias.  Skin: Negative.   Allergic/Immunologic: Negative.   Neurological: Positive for dizziness, tremors and weakness.  Hematological: Negative.   Psychiatric/Behavioral: Negative.   All other  systems reviewed and are negative.      Objective:   Physical Exam  Left FPL improved MAS 2/3 to MAS 0 FDS digits 2 MAS 3 FDS dig 3 MAS 2 FDS dig 4 and 5  MAS 1 FDP dig 2 MAS 3 FDP dig 3 MAS 3 FDP dig 4 and 5 MAS 0  Wrist flexors MAS1  Pronators MAS 2    Assessment & Plan:   1.  Left spastic hemiplegia secondary to right paramedian pontomedullary infarct repeat Xeomin in 6 wks FCR 50 FCU 50 FPL 50 FDP 50 FDS 50 PT 50  Use estim to target digits 2 and 3

## 2018-10-26 NOTE — Telephone Encounter (Signed)
Patients MRI is scheduled for 11/09/18 at 1:30 and patient is aware.

## 2018-10-29 ENCOUNTER — Other Ambulatory Visit: Payer: Self-pay | Admitting: Physical Medicine & Rehabilitation

## 2018-10-30 ENCOUNTER — Other Ambulatory Visit: Payer: Self-pay | Admitting: Internal Medicine

## 2018-11-08 ENCOUNTER — Telehealth: Payer: Self-pay | Admitting: Internal Medicine

## 2018-11-08 NOTE — Telephone Encounter (Signed)
Vicente Serene from [facility] called requesting prior Auth completed for MRI scheduled 11/11/2018. Please complete as soon as possible  [name] Ext [phone]

## 2018-11-09 ENCOUNTER — Ambulatory Visit (HOSPITAL_COMMUNITY): Payer: BC Managed Care – PPO

## 2018-11-09 NOTE — Telephone Encounter (Signed)
[name] called back for a prior auth [name] Ext [phone]. Please follow up

## 2018-11-09 NOTE — Telephone Encounter (Signed)
Returned [place] call to clarify if another prior Josem Kaufmann needs to be done

## 2018-11-09 NOTE — Telephone Encounter (Signed)
Spoke with [name] and per [name] they are needing a prior auth for the MRI.

## 2018-11-11 ENCOUNTER — Ambulatory Visit: Payer: BC Managed Care – PPO

## 2018-11-11 ENCOUNTER — Ambulatory Visit (HOSPITAL_COMMUNITY): Admission: RE | Admit: 2018-11-11 | Payer: BC Managed Care – PPO | Source: Ambulatory Visit

## 2018-11-11 ENCOUNTER — Encounter: Payer: Self-pay | Admitting: Family Medicine

## 2018-11-11 ENCOUNTER — Ambulatory Visit (HOSPITAL_BASED_OUTPATIENT_CLINIC_OR_DEPARTMENT_OTHER): Payer: BC Managed Care – PPO | Admitting: Pharmacist

## 2018-11-11 ENCOUNTER — Other Ambulatory Visit: Payer: Self-pay

## 2018-11-11 ENCOUNTER — Ambulatory Visit: Payer: BC Managed Care – PPO | Attending: Family Medicine | Admitting: Family Medicine

## 2018-11-11 DIAGNOSIS — G8114 Spastic hemiplegia affecting left nondominant side: Secondary | ICD-10-CM | POA: Diagnosis not present

## 2018-11-11 DIAGNOSIS — M1A09X0 Idiopathic chronic gout, multiple sites, without tophus (tophi): Secondary | ICD-10-CM

## 2018-11-11 DIAGNOSIS — E119 Type 2 diabetes mellitus without complications: Secondary | ICD-10-CM

## 2018-11-11 DIAGNOSIS — I6930 Unspecified sequelae of cerebral infarction: Secondary | ICD-10-CM | POA: Diagnosis not present

## 2018-11-11 DIAGNOSIS — Z23 Encounter for immunization: Secondary | ICD-10-CM

## 2018-11-11 DIAGNOSIS — Z7982 Long term (current) use of aspirin: Secondary | ICD-10-CM | POA: Diagnosis not present

## 2018-11-11 DIAGNOSIS — E1159 Type 2 diabetes mellitus with other circulatory complications: Secondary | ICD-10-CM | POA: Diagnosis not present

## 2018-11-11 DIAGNOSIS — Z85038 Personal history of other malignant neoplasm of large intestine: Secondary | ICD-10-CM

## 2018-11-11 MED ORDER — INDOMETHACIN 50 MG PO CAPS: 50.0000 mg | ORAL_CAPSULE | Freq: Two times a day (BID) | ORAL | 0 refills | Status: DC

## 2018-11-11 NOTE — Progress Notes (Signed)
Patient presents for vaccination against influenza and tetanus per orders of Dr. Johnson. Consent given. Counseling provided. No contraindications exists. Vaccine administered without incident.   

## 2018-11-11 NOTE — Progress Notes (Signed)
Patient verified DOB Patient has eaten Patient has taken medication today. Patient denies pain at this time.

## 2018-11-11 NOTE — Progress Notes (Signed)
Virtual Visit via Telephone Note  I connected with [name] and his wife on 11/11/18 at  9:50 AM EDT by telephone and verified that I am speaking with the correct person using two identifiers.   I discussed the limitations, risks, security and privacy concerns of performing an evaluation and management service by telephone and the availability of in person appointments. I also discussed with the patient that there may be a patient responsible charge related to this service. The patient expressed understanding and agreed to proceed.  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  Patient asked that his wife give further details due to his issues with speech.  Per wife, patient needs refill of medication for pain as he is status post recent flareup of gout in his right knee and would like to have prescription in case the pain recurs.  Patient and wife cannot recall if patient has ever been on medication long-term to help suppress/decrease uric acid levels/prevent gout flareups.  Medicine for gout was initially given by patient's nephrologist whom he sees in follow-up of history of CVA, history of seizures and brain mass.         Wife was asked if patient was having any current symptoms related to diabetes as it appears that he has had past diagnosis of diabetes and last hemoglobin A1c appears to have been done 11/23/2017 and was 6.3 at that time.  Wife and husband deny any current increased urinary frequency or increased thirst on the part of the patient.  They did discuss with CMA that patient could come in later today to have immunizations and patient is willing to have blood work done when he comes in to have immunizations  later today.          On further review of systems, patient has had no recent headaches or dizziness, no chest pain or palpitations, no shortness of breath or cough.  No current abdominal pain-no nausea or vomiting.  No notice of blood in the stools or black stools.  Patient continues to take daily aspirin (325 mg per chart).           Past Medical History:  Diagnosis Date  . Colon cancer ([facility])    rectal  . Diabetes mellitus without complication ([place])   . Gait abnormality 03/31/2018  . Hemiparesis and speech and language deficit as late effects of stroke ([place]) 03/31/2018  . Hypertension   . Seizure ([place])   . Stroke [facility]) 2018    Past Surgical History:  Procedure Laterality Date  . APPENDECTOMY    . JOINT REPLACEMENT Left 2004   Left hip  . TOTAL HIP ARTHROPLASTY      Family History  Problem Relation Age of Onset  . Hypertension Mother   . Heart disease Mother   . Hyperlipidemia Mother   . Heart failure Mother   . Hypertension Sister   . Hypertension Brother   . Stroke Father   . Hypertension Father   . Colon cancer Maternal Grandmother   . Diabetes Maternal Grandmother     Social History   Tobacco Use  . Smoking status: Never Smoker  . Smokeless tobacco: Never Used  Substance Use Topics  .  Alcohol use: Yes    Frequency: Never    Comment: socially  . Drug use: No     No Known Allergies     Observations/Objective: No vital signs or physical exam conducted as visit was done via telephone  Assessment and Plan: 1. Chronic gout of multiple sites, unspecified cause Patient reports issues with chronic gout of multiple sites.  Patient requested previously prescribed medication for treatment of gout pain.  Prescription sent to patient's pharmacy for indomethacin No. 20 capsules with no refill to take up to 2 times daily after eating.  On review of labs, I did not see a prior uric acid level therefore this will be ordered for lab visit later today to confirm diagnosis of  gout.  Patient reports he would be interested in rheumatology referral as he has had swelling and pain in different joints over the years and also has osteoarthritis.  Patient will also have comprehensive metabolic panel and CBC in follow-up of use of indomethacin and to check renal function if medication such as allopurinol or colchicine needs to be initiated. - indomethacin (INDOCIN) 50 MG capsule; Take 1 capsule (50 mg total) by mouth 2 (two) times daily with a meal.  Dispense: 20 capsule; Refill: 0 - Uric Acid; Future - Comprehensive metabolic panel; Future  2. Type 2 diabetes mellitus, diet controlled Patient has diagnosis in chart of type 2 diabetes with vascular disease and patient with history of CVA.  Does not appear to be a hemoglobin A1c in chart since 11/23/2017 and patient will have hemoglobin A1c and comprehensive metabolic panel at today's visit.  His last hemoglobin A1c was 6.3 indicating good control of diabetes as he does have past diabetes diagnosis in the chart.  His medication list does not currently include medication for diabetes/insulin resistance therefore his diabetes is currently diet controlled.  - Hemoglobin A1c; Future - Comprehensive metabolic panel; Future  3. Long-term use of aspirin therapy; 4.  History of colon cancer; history of CVA with residual deficits Patient will have CBC at upcoming lab visit later today due to his long-term use of aspirin therapy status post ischemic CVA/moderate intracranial atherosclerotic disease, request for indomethacin for treatment of gout, history of colon cancer/rectal cancer which appears to have occurred in 2009, as mention was made of rectal adenocarcinoma on CT scan and sigmoidoscopy with polypoid 4 to 5 cm mass emanating from a thick broad-based stop that is confluent with the anal canal and for which surgery was being planned for removal of mass.  Operative note in chart from 09/21/2007 for transanal excision of rectal carcinoma by  Dr. Barry Dienes.  He may also benefit from H2 blocker therapy or pantoprazole due to long term use of high-dose aspirin to help prevent gastritis/GI bleed. - CBC with Differential; Future  Addendum: Patient with anemia on CBC with hemoglobin of 12.0 with normal range of 13.0-17.7.  Labs are also being forwarded to patient's primary care provider.  Patient will likely need GI referral due to history of colon cancer and possible PPI or H2 therapy.  Will place GI referral.   Follow Up Instructions: Keep scheduled follow-up with PCP in December; sooner if needed based on labs    I discussed the assessment and treatment plan with the patient. The patient was provided an opportunity to ask questions and all were answered. The patient agreed with the plan and demonstrated an understanding of the instructions.   The patient was advised to call back or seek an in-person evaluation  if the symptoms worsen or if the condition fails to improve as anticipated.  I provided 12 minutes of non-face-to-face time during this encounter speaking with patient and wife and an additional 15 minutes for chart review of prior labs, admissions, medications and specialty follow-up.   Antony Blackbird, MD

## 2018-11-12 LAB — CBC WITH DIFFERENTIAL/PLATELET
Basophils Absolute: 0 x10E3/uL (ref 0.0–0.2)
Basos: 1 %
EOS (ABSOLUTE): 0.1 x10E3/uL (ref 0.0–0.4)
Eos: 2 %
Hematocrit: 35.2 % — ABNORMAL LOW (ref 37.5–51.0)
Hemoglobin: 12 g/dL — ABNORMAL LOW (ref 13.0–17.7)
Immature Grans (Abs): 0 x10E3/uL (ref 0.0–0.1)
Immature Granulocytes: 0 %
Lymphocytes Absolute: 1.3 x10E3/uL (ref 0.7–3.1)
Lymphs: 26 %
MCH: 25.9 pg — ABNORMAL LOW (ref 26.6–33.0)
MCHC: 34.1 g/dL (ref 31.5–35.7)
MCV: 76 fL — ABNORMAL LOW (ref 79–97)
Monocytes Absolute: 0.4 x10E3/uL (ref 0.1–0.9)
Monocytes: 8 %
Neutrophils Absolute: 3 x10E3/uL (ref 1.4–7.0)
Neutrophils: 63 %
Platelets: 237 x10E3/uL (ref 150–450)
RBC: 4.64 x10E6/uL (ref 4.14–5.80)
RDW: 14.2 % (ref 11.6–15.4)
WBC: 4.8 x10E3/uL (ref 3.4–10.8)

## 2018-11-12 LAB — COMPREHENSIVE METABOLIC PANEL WITH GFR
ALT: 18 IU/L (ref 0–44)
AST: 10 IU/L (ref 0–40)
Albumin/Globulin Ratio: 1.9 (ref 1.2–2.2)
Albumin: 4.5 g/dL (ref 3.8–4.9)
Alkaline Phosphatase: 99 IU/L (ref 39–117)
BUN/Creatinine Ratio: 10 (ref 9–20)
BUN: 11 mg/dL (ref 6–24)
Bilirubin Total: 0.4 mg/dL (ref 0.0–1.2)
CO2: 23 mmol/L (ref 20–29)
Calcium: 9.4 mg/dL (ref 8.7–10.2)
Chloride: 105 mmol/L (ref 96–106)
Creatinine, Ser: 1.12 mg/dL (ref 0.76–1.27)
GFR calc Af Amer: 87 mL/min/1.73
GFR calc non Af Amer: 75 mL/min/1.73
Globulin, Total: 2.4 g/dL (ref 1.5–4.5)
Glucose: 140 mg/dL — ABNORMAL HIGH (ref 65–99)
Potassium: 4.1 mmol/L (ref 3.5–5.2)
Sodium: 142 mmol/L (ref 134–144)
Total Protein: 6.9 g/dL (ref 6.0–8.5)

## 2018-11-12 LAB — HEMOGLOBIN A1C
Est. average glucose Bld gHb Est-mCnc: 140 mg/dL
Hgb A1c MFr Bld: 6.5 % — ABNORMAL HIGH (ref 4.8–5.6)

## 2018-11-12 LAB — URIC ACID: Uric Acid: 9.7 mg/dL — ABNORMAL HIGH (ref 3.7–8.6)

## 2018-11-13 ENCOUNTER — Encounter: Payer: Self-pay | Admitting: Family Medicine

## 2018-11-15 ENCOUNTER — Other Ambulatory Visit: Payer: Self-pay | Admitting: Internal Medicine

## 2018-11-15 DIAGNOSIS — M1A09X0 Idiopathic chronic gout, multiple sites, without tophus (tophi): Secondary | ICD-10-CM

## 2018-11-15 NOTE — Telephone Encounter (Signed)
Dr. Wynetta Emery since they are wanting [place] to go through the prior auth request again what should I do from this stand point. The patient was schedule for MRI on 10/29 and it was canceled due to no authorization.

## 2018-11-16 NOTE — Telephone Encounter (Signed)
Please refill if appropriate request.

## 2018-11-17 ENCOUNTER — Other Ambulatory Visit: Payer: Self-pay | Admitting: Internal Medicine

## 2018-11-17 DIAGNOSIS — I10 Essential (primary) hypertension: Secondary | ICD-10-CM

## 2018-11-19 ENCOUNTER — Encounter: Payer: Self-pay | Admitting: *Deleted

## 2018-11-22 ENCOUNTER — Other Ambulatory Visit: Payer: Self-pay | Admitting: Internal Medicine

## 2018-11-22 ENCOUNTER — Other Ambulatory Visit: Payer: Self-pay | Admitting: Pharmacist

## 2018-11-22 DIAGNOSIS — I69359 Hemiplegia and hemiparesis following cerebral infarction affecting unspecified side: Secondary | ICD-10-CM

## 2018-11-22 DIAGNOSIS — I10 Essential (primary) hypertension: Secondary | ICD-10-CM

## 2018-11-22 MED ORDER — ATORVASTATIN CALCIUM 80 MG PO TABS: 80.0000 mg | ORAL_TABLET | Freq: Every day | ORAL | 0 refills | Status: DC

## 2018-11-22 MED ORDER — OLMESARTAN MEDOXOMIL 20 MG PO TABS: ORAL_TABLET | ORAL | 0 refills | Status: DC

## 2018-11-22 MED ORDER — CARVEDILOL 25 MG PO TABS: ORAL_TABLET | ORAL | 3 refills | Status: DC

## 2018-12-03 ENCOUNTER — Other Ambulatory Visit: Payer: Self-pay

## 2018-12-03 ENCOUNTER — Encounter: Payer: Self-pay | Admitting: Physical Medicine & Rehabilitation

## 2018-12-03 ENCOUNTER — Encounter
Payer: BC Managed Care – PPO | Attending: Physical Medicine & Rehabilitation | Admitting: Physical Medicine & Rehabilitation

## 2018-12-03 VITALS — BP 113/75 | HR 78 | Temp 97.5°F | Ht 64.0 in | Wt 210.0 lb

## 2018-12-03 DIAGNOSIS — G8114 Spastic hemiplegia affecting left nondominant side: Secondary | ICD-10-CM

## 2018-12-03 DIAGNOSIS — Z96642 Presence of left artificial hip joint: Secondary | ICD-10-CM | POA: Diagnosis not present

## 2018-12-03 DIAGNOSIS — N3289 Other specified disorders of bladder: Secondary | ICD-10-CM | POA: Insufficient documentation

## 2018-12-03 DIAGNOSIS — I69398 Other sequelae of cerebral infarction: Secondary | ICD-10-CM | POA: Diagnosis not present

## 2018-12-03 DIAGNOSIS — I10 Essential (primary) hypertension: Secondary | ICD-10-CM | POA: Diagnosis not present

## 2018-12-03 DIAGNOSIS — Z79899 Other long term (current) drug therapy: Secondary | ICD-10-CM | POA: Diagnosis not present

## 2018-12-03 DIAGNOSIS — I739 Peripheral vascular disease, unspecified: Secondary | ICD-10-CM | POA: Insufficient documentation

## 2018-12-03 DIAGNOSIS — E785 Hyperlipidemia, unspecified: Secondary | ICD-10-CM | POA: Insufficient documentation

## 2018-12-03 DIAGNOSIS — I69354 Hemiplegia and hemiparesis following cerebral infarction affecting left non-dominant side: Secondary | ICD-10-CM | POA: Diagnosis not present

## 2018-12-03 DIAGNOSIS — Z823 Family history of stroke: Secondary | ICD-10-CM | POA: Diagnosis not present

## 2018-12-03 DIAGNOSIS — Z8249 Family history of ischemic heart disease and other diseases of the circulatory system: Secondary | ICD-10-CM | POA: Insufficient documentation

## 2018-12-03 DIAGNOSIS — R569 Unspecified convulsions: Secondary | ICD-10-CM | POA: Diagnosis not present

## 2018-12-03 DIAGNOSIS — E119 Type 2 diabetes mellitus without complications: Secondary | ICD-10-CM | POA: Insufficient documentation

## 2018-12-03 DIAGNOSIS — Z7982 Long term (current) use of aspirin: Secondary | ICD-10-CM | POA: Insufficient documentation

## 2018-12-03 DIAGNOSIS — Z85048 Personal history of other malignant neoplasm of rectum, rectosigmoid junction, and anus: Secondary | ICD-10-CM | POA: Diagnosis not present

## 2018-12-03 DIAGNOSIS — F329 Major depressive disorder, single episode, unspecified: Secondary | ICD-10-CM | POA: Diagnosis not present

## 2018-12-03 NOTE — Progress Notes (Signed)
Xeomin Injection for spasticity using needle EMG guidance  Dilution: 50 Units/ml Indication: Severe spasticity which interferes with ADL,mobility and/or  hygiene and is unresponsive to medication management and other conservative care Informed consent was obtained after describing risks and benefits of the procedure with the patient. This includes bleeding, bruising, infection, excessive weakness, or medication side effects. A REMS form is on file and signed.  27g 1" needle electrode Number of units per muscle FCR 50 FCU 50 PL 50 FDP 50 FDS 50 PT 50  All injections were done after obtaining appropriate EMG activity and after negative drawback for blood. The patient tolerated the procedure well. Post procedure instructions were given. A followup appointment was made.

## 2018-12-03 NOTE — Patient Instructions (Signed)

## 2018-12-04 ENCOUNTER — Other Ambulatory Visit: Payer: Self-pay

## 2018-12-04 DIAGNOSIS — Z20822 Contact with and (suspected) exposure to covid-19: Secondary | ICD-10-CM

## 2018-12-06 LAB — NOVEL CORONAVIRUS, NAA: SARS-CoV-2, NAA: NOT DETECTED

## 2018-12-23 ENCOUNTER — Ambulatory Visit: Payer: BC Managed Care – PPO | Admitting: Internal Medicine

## 2019-01-18 ENCOUNTER — Encounter: Payer: BC Managed Care – PPO | Admitting: Physical Medicine & Rehabilitation

## 2019-01-27 ENCOUNTER — Ambulatory Visit: Payer: Self-pay | Attending: Internal Medicine | Admitting: Internal Medicine

## 2019-01-27 ENCOUNTER — Other Ambulatory Visit: Payer: Self-pay

## 2019-01-27 DIAGNOSIS — C20 Malignant neoplasm of rectum: Secondary | ICD-10-CM

## 2019-01-27 DIAGNOSIS — E1159 Type 2 diabetes mellitus with other circulatory complications: Secondary | ICD-10-CM

## 2019-01-27 DIAGNOSIS — D509 Iron deficiency anemia, unspecified: Secondary | ICD-10-CM

## 2019-01-27 DIAGNOSIS — R569 Unspecified convulsions: Secondary | ICD-10-CM

## 2019-01-27 DIAGNOSIS — Z86011 Personal history of benign neoplasm of the brain: Secondary | ICD-10-CM

## 2019-01-27 DIAGNOSIS — G8114 Spastic hemiplegia affecting left nondominant side: Secondary | ICD-10-CM

## 2019-01-27 DIAGNOSIS — I10 Essential (primary) hypertension: Secondary | ICD-10-CM

## 2019-01-27 NOTE — Progress Notes (Signed)
Virtual Visit via Telephone Note Due to current restrictions/limitations of in-office visits due to the COVID-19 pandemic, this scheduled clinical appointment was converted to a telehealth visit I connected with [name] on 01/27/19 at 3:32 p.m by telephone and verified that I am speaking with the correct person using two identifiers. I am in my office.  The patient is at home.  Only the patient and myself participated in this encounter.  I discussed the limitations, risks, security and privacy concerns of performing an evaluation and management service by telephone and the availability of in person appointments. I also discussed with the patient that there may be a patient responsible charge related to this service. The patient expressed understanding and agreed to proceed.   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 Last visit was 10/2018 with Dr. Chapman Fitch.  Anemia/Hx of rectal CA:  Referred to GI on last visit with Fulp.  [facility] reports they spoke with pt's wife and she said they plan to stick with [facility].  I had referred to [facility] in 2019 and they decline until pt spoke with their business office. No blood in stools. Moving bowels okay.    HTN:  No blood pressure device BP readings good at PMR visits Limits salt in foods.  No CP/SOB/LE edema  DM:  Does not check BS but does have a device Doing well with eating habits.  Last A1c was 6.52 months ago.  Hx CVA with residualLT spastic hemiparesisand dysarthria  No falls.  He feeds himself No SZ. -He had seen the neurologist Dr. Jannifer Franklin since I last saw him.  They were hoping that he could get the MRI of his head.  We were unable to get prior authorization Outpatient Encounter Medications as of 01/27/2019  Medication Sig  . amLODipine (NORVASC) 10 MG tablet TAKE 1 TABLET BY MOUTH EVERY DAY  . aspirin  325 MG tablet Take 1 tablet (325 mg total) by mouth daily.  Marland Kitchen atorvastatin (LIPITOR) 80 MG tablet Take 1 tablet (80 mg total) by mouth daily.  . carvedilol (COREG) 25 MG tablet TAKE 1 TABLET BY MOUTH TWICE A DAY WITH A MEAL  . indomethacin (INDOCIN) 50 MG capsule TAKE 1 CAPSULE (50 MG TOTAL) BY MOUTH 2 (TWO) TIMES DAILY WITH A MEAL.  . Melatonin 3 MG TABS Take 3 tablets (9 mg total) by mouth at bedtime.  Marland Kitchen olmesartan (BENICAR) 20 MG tablet TAKE 1 TABLET BY MOUTH EVERY DAY STOP LOSARTAN  . oxybutynin (DITROPAN) 5 MG tablet TAKE 1 TABLET BY MOUTH TWICE A DAY   No facility-administered encounter medications on file as of 01/27/2019.      Observations/Objective: Results for orders placed or performed in visit on 12/04/18  Novel Coronavirus, NAA ([facility])   Specimen: Nasopharyngeal(NP) swabs in vial transport medium   NASOPHARYNGE  SCREENIN  Result Value Ref Range   SARS-CoV-2, NAA Not Detected Not Detected   Lab Results  Component Value Date   HGBA1C 6.5 (H) 11/11/2018     Chemistry      Component Value Date/Time   NA 142 11/11/2018 1400   K 4.1 11/11/2018 1400   CL 105 11/11/2018 1400   CO2 23 11/11/2018 1400   BUN 11 11/11/2018 1400   CREATININE 1.12 11/11/2018 1400      Component Value Date/Time   CALCIUM 9.4 11/11/2018 1400   ALKPHOS 99 11/11/2018 1400  AST 10 11/11/2018 1400   ALT 18 11/11/2018 1400   BILITOT 0.4 11/11/2018 1400     Lab Results  Component Value Date   WBC 4.8 11/11/2018   HGB 12.0 (L) 11/11/2018   HCT 35.2 (L) 11/11/2018   MCV 76 (L) 11/11/2018   PLT 237 11/11/2018    Assessment and Plan: 1. Type 2 diabetes mellitus with vascular disease ([place]) -Diet control.  Continue healthy eating habits  2. Essential hypertension BP at goal on review of readings from PMR.  Cont current med  3. Spastic hemiplegia affecting left nondominant side, unspecified etiology ([place]) Followed by PMR  4. Seizures ([place]) No recent sz  5. Rectal cancer ([place]) 6.  Microcytic anemia  I call and spoke with pt's wife after he hung up from him.  She said she did not speak with anyone from [place].  She would like to have the c-scope through [facility]  7. History of meningioma of the brain -Wife tells me that they had gone to have the MRI done at the hospital but was informed by radiology that his insurance did not approve it.  I told her that when he sees the neurologist again she can mention this to him to see whether his office would be successful in getting it approved   Follow Up Instructions: 4 mths   I discussed the assessment and treatment plan with the patient. The patient was provided an opportunity to ask questions and all were answered. The patient agreed with the plan and demonstrated an understanding of the instructions.   The patient was advised to call back or seek an in-person evaluation if the symptoms worsen or if the condition fails to improve as anticipated.  I provided 10 minutes of non-face-to-face time during this encounter.   Karle Plumber, MD

## 2019-01-28 ENCOUNTER — Encounter: Payer: Self-pay | Admitting: Gastroenterology

## 2019-02-20 ENCOUNTER — Other Ambulatory Visit: Payer: Self-pay | Admitting: Internal Medicine

## 2019-02-20 DIAGNOSIS — I10 Essential (primary) hypertension: Secondary | ICD-10-CM

## 2019-03-08 ENCOUNTER — Ambulatory Visit: Payer: Self-pay | Admitting: Gastroenterology

## 2019-03-15 IMAGING — MR MR HEAD W/O CM
9 of 10 series · 35 of 48 positions shown · non-contrast
Comparison: CT head and CTA head neck 12/12/2016. MRI brain without
with contrast 09/04/2014.

CLINICAL DATA: LEFT-sided weakness. This began early a.m.
12/12/2016. History of hypertension and diabetes.

EXAM:
MRI HEAD WITHOUT CONTRAST
TECHNIQUE: Multiplanar, multiecho pulse sequences of the brain and surrounding
structures were obtained without intravenous contrast.

[Series 5: DWI · axial · 3.0mm · 0.94mm/px · z∈[-40,+106]mm · 9 of 100 slices shown (1 of 2)]
[im 1/100]
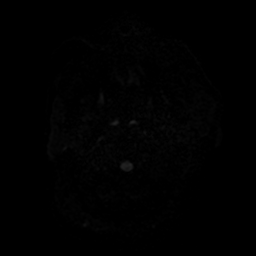
[im 13/100]
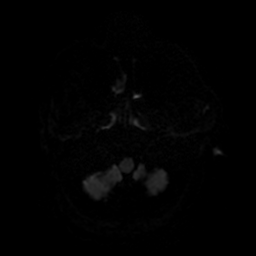
[im 25/100]
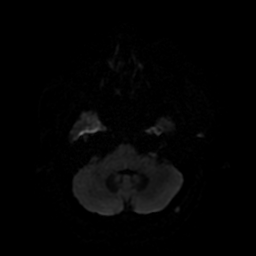
[im 38/100]
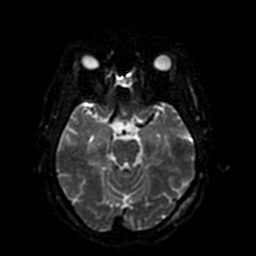
[im 50/100]
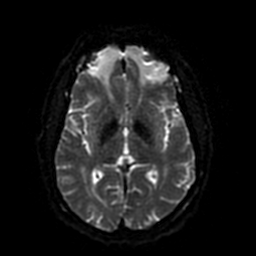
[im 62/100]
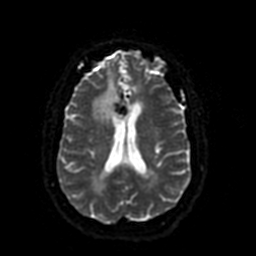
[im 75/100]
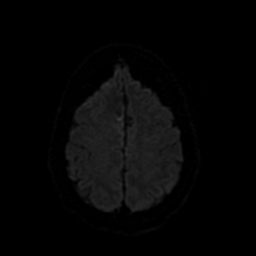
[im 87/100]
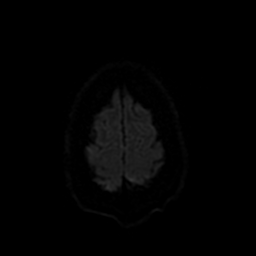
[im 100/100]
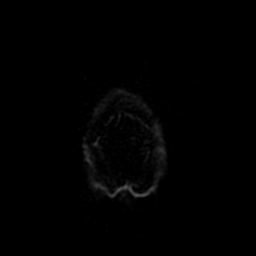

[Series 6: FLAIR · sagittal · 5.0mm · 0.47mm/px · 2 of 23 slices shown (1 of 2)]
[im 1/23]
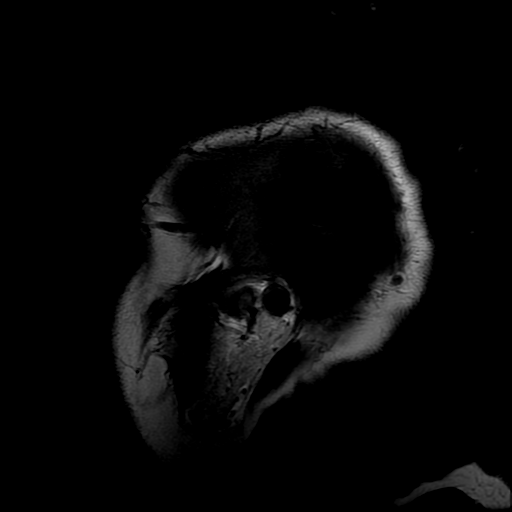
[im 23/23]
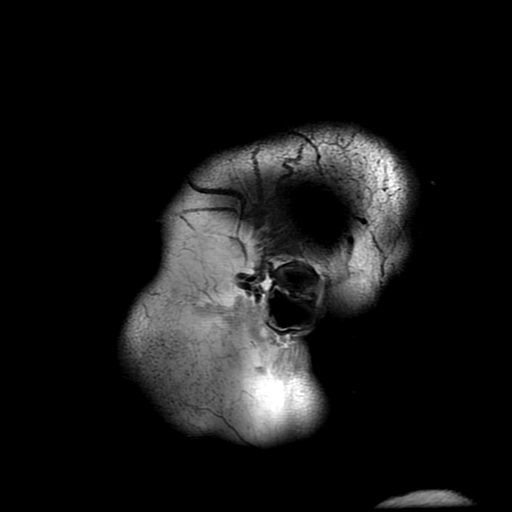

[Series 7: DWI · coronal · 4.0mm · 0.94mm/px · 7 of 76 slices shown (2 of 2)]
[im 1/76]
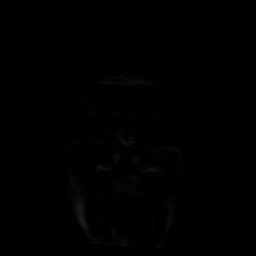
[im 13/76]
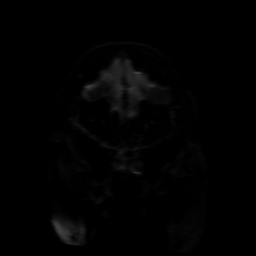
[im 26/76]
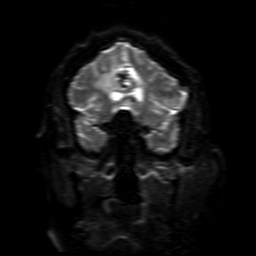
[im 38/76]
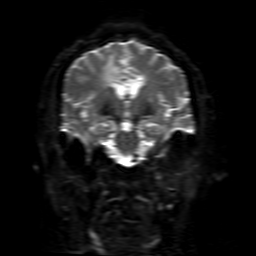
[im 51/76]
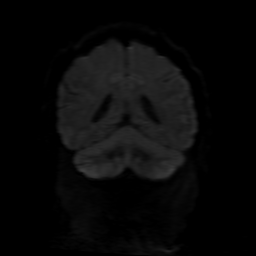
[im 63/76]
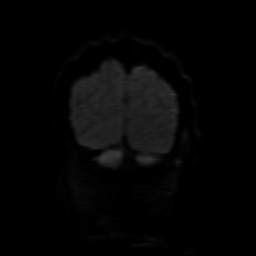
[im 76/76]
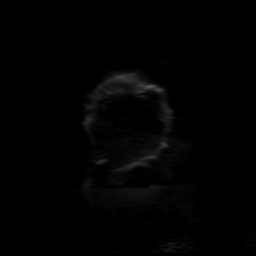

[Series 8: T2 · axial · 5.0mm · 0.43mm/px · z∈[-38,+111]mm · 2 of 26 slices shown (1 of 2)]
[im 1/26]
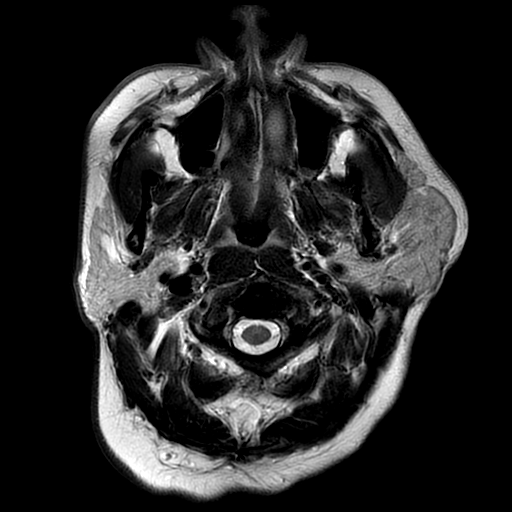
[im 26/26]
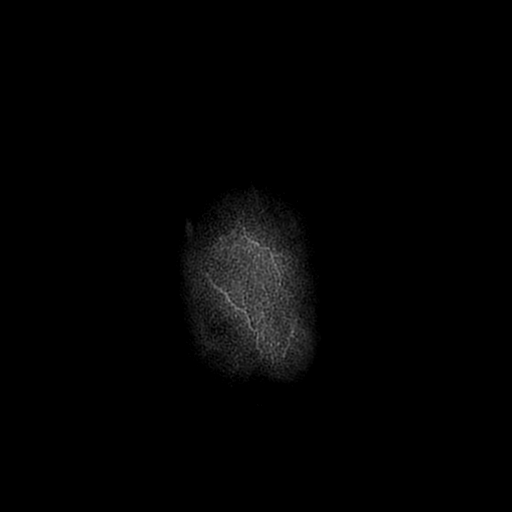

[Series 9: FLAIR · axial · 3.0mm · 0.43mm/px · z∈[-38,+111]mm · 2 of 26 slices shown (2 of 2)]
[im 1/26]
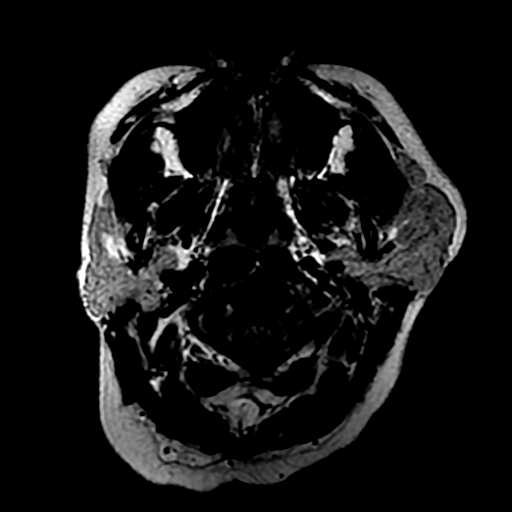
[im 26/26]
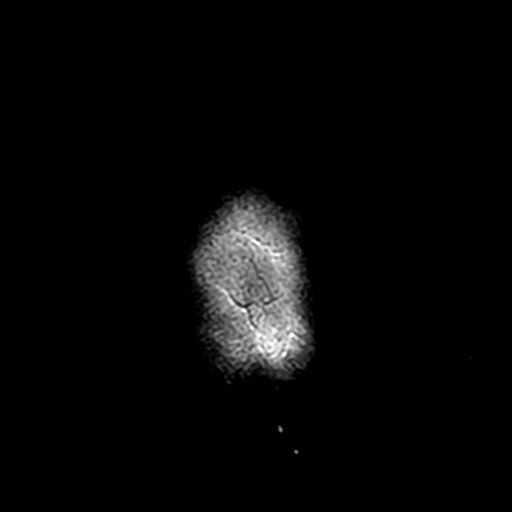

[Series 10: (person_name) · axial · 3.0mm · 0.47mm/px · z∈[-37,-21]mm · 2 of 104 slices shown]
[im 1/104]
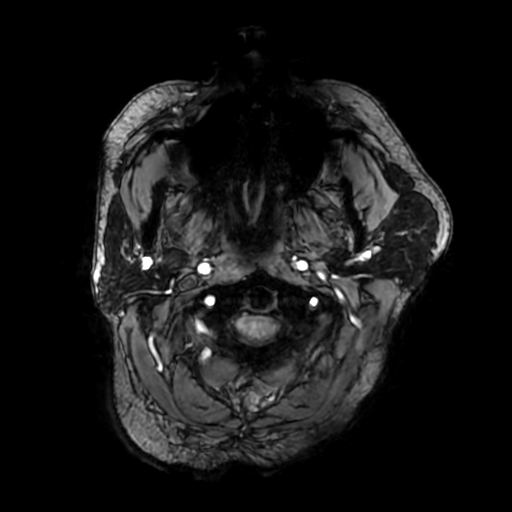
[im 12/104]
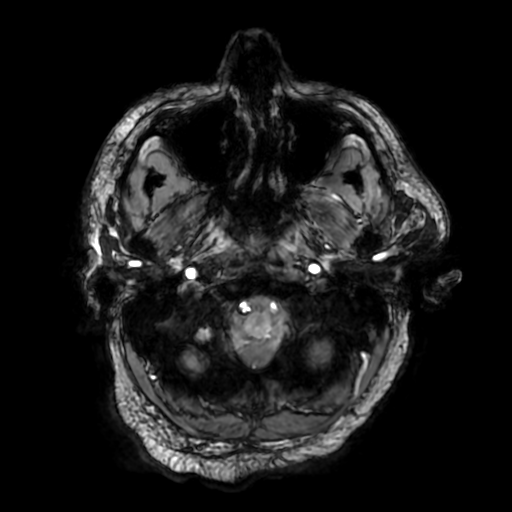

[Series 12: T2 · coronal · 5.0mm · 0.39mm/px · 3 of 32 slices shown (2 of 2)]
[im 1/32]
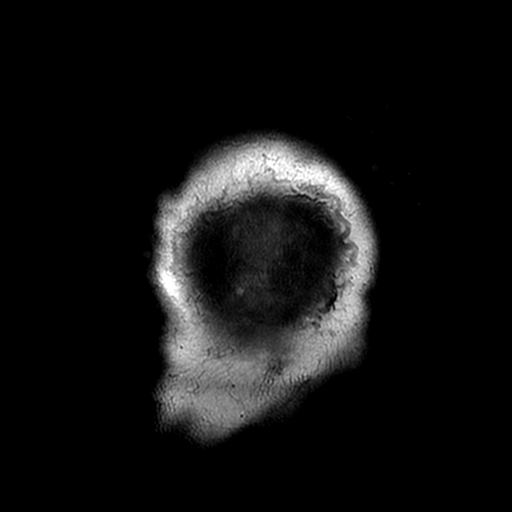
[im 16/32]
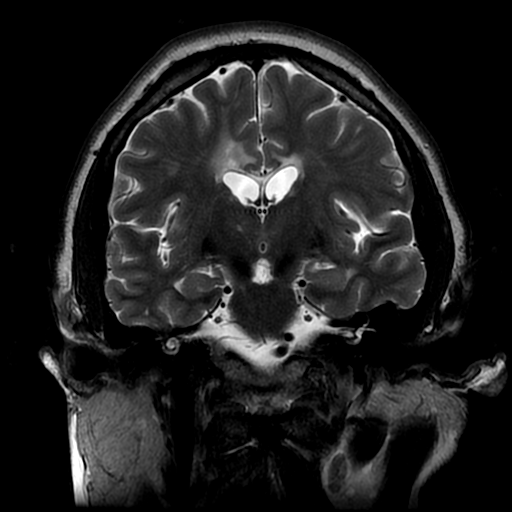
[im 32/32]
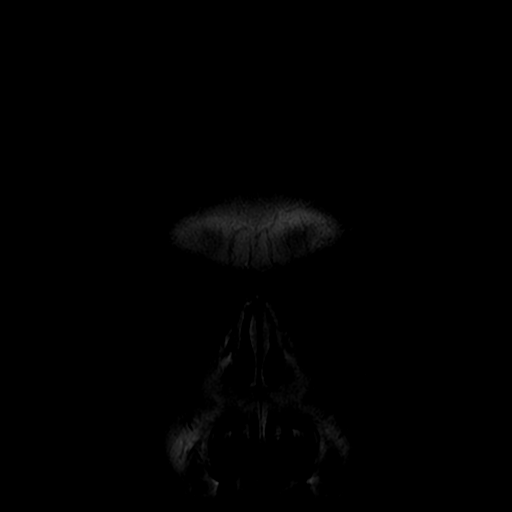

[Series 550: ADC · axial · 3.0mm · 0.94mm/px · z∈[-40,+106]mm · 5 of 50 slices shown (1 of 2)]
[im 1/50]
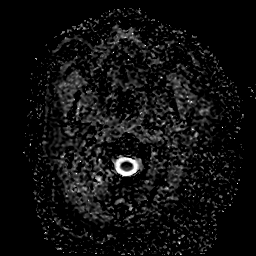
[im 13/50]
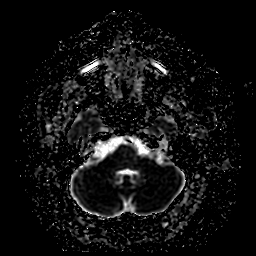
[im 25/50]
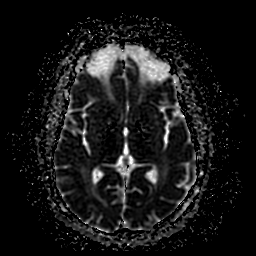
[im 37/50]
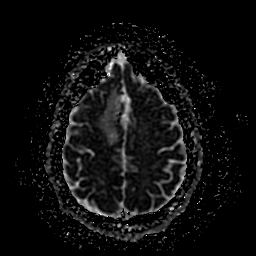
[im 50/50]
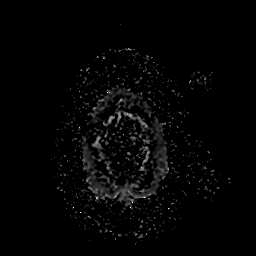

[Series 750: ADC · coronal · 4.0mm · 0.94mm/px · 3 of 38 slices shown (2 of 2)]
[im 1/38]
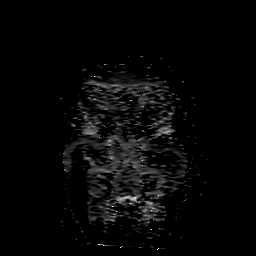
[im 19/38]
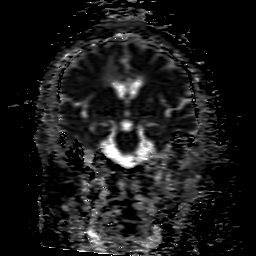
[im 38/38]
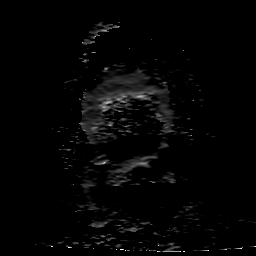

[35 of 48 positions shown; findings below may reference images not displayed]

FINDINGS: Brain: There is a moderately large brainstem infarct, affecting the
lower pons and upper medulla, RIGHT paramedian location, extending
from the basis pontis to the floor of the fourth ventricle. No other
areas of acute infarction are seen.

No hemorrhage, hydrocephalus, or extra-axial fluid.

Heavily calcified mass involving the medial RIGHT hemisphere, could
be slightly increased from priors. Cross-section of 15 x 37 mm
compares with 14 x 34 mm in 4241. A small component of the mass does
extend under the falx to the LEFT, as noted previously. Surrounding
vasogenic edema is present primarily in the RIGHT hemisphere. No
contrast was administered. Concern for calcific glial neoplasm is
raised, as the mass does appear to be intra-axial.

Marked corpus callosum all atrophy, uncertain etiology.

Bifrontal white matter gliosis, in this patient with a history of
trauma. Other areas of subcortical and periventricular white matter
signal abnormality, probable small vessel disease.

Vascular: Normal flow voids.

Skull and upper cervical spine: Normal marrow signal.

Sinuses/Orbits: Negative.

Other: None.
IMPRESSION: Acute RIGHT paramedian brainstem infarct affecting lower pons and
upper medulla, is responsible for the reported symptoms. This
appears to represent a small vessel insult, as the adjacent major
vascular structures appear widely patent.

Heavily calcified mass, probably intra-axial, involves the medial
RIGHT hemisphere, slightly increased from 4241. Further assessment
could be performed with post infusion imaging as clinically
indicated.

Atrophy and small vessel disease.

## 2019-03-16 ENCOUNTER — Other Ambulatory Visit: Payer: Self-pay | Admitting: Internal Medicine

## 2019-03-16 DIAGNOSIS — I69359 Hemiplegia and hemiparesis following cerebral infarction affecting unspecified side: Secondary | ICD-10-CM

## 2019-03-17 ENCOUNTER — Other Ambulatory Visit: Payer: Self-pay | Admitting: Family Medicine

## 2019-03-17 DIAGNOSIS — M1A09X0 Idiopathic chronic gout, multiple sites, without tophus (tophi): Secondary | ICD-10-CM

## 2019-04-10 ENCOUNTER — Other Ambulatory Visit: Payer: Self-pay | Admitting: Internal Medicine

## 2019-04-10 DIAGNOSIS — I10 Essential (primary) hypertension: Secondary | ICD-10-CM

## 2019-04-12 ENCOUNTER — Other Ambulatory Visit: Payer: Self-pay | Admitting: Internal Medicine

## 2019-04-13 ENCOUNTER — Other Ambulatory Visit: Payer: Self-pay

## 2019-04-13 ENCOUNTER — Encounter: Payer: Self-pay | Admitting: Neurology

## 2019-04-13 ENCOUNTER — Ambulatory Visit (INDEPENDENT_AMBULATORY_CARE_PROVIDER_SITE_OTHER): Payer: BC Managed Care – PPO | Admitting: Neurology

## 2019-04-13 VITALS — BP 137/90 | HR 72 | Temp 97.8°F

## 2019-04-13 DIAGNOSIS — D329 Benign neoplasm of meninges, unspecified: Secondary | ICD-10-CM

## 2019-04-13 DIAGNOSIS — I69359 Hemiplegia and hemiparesis following cerebral infarction affecting unspecified side: Secondary | ICD-10-CM

## 2019-04-13 DIAGNOSIS — G8194 Hemiplegia, unspecified affecting left nondominant side: Secondary | ICD-10-CM

## 2019-04-13 DIAGNOSIS — I69328 Other speech and language deficits following cerebral infarction: Secondary | ICD-10-CM

## 2019-04-13 DIAGNOSIS — I6350 Cerebral infarction due to unspecified occlusion or stenosis of unspecified cerebral artery: Secondary | ICD-10-CM

## 2019-04-13 DIAGNOSIS — R569 Unspecified convulsions: Secondary | ICD-10-CM

## 2019-04-13 DIAGNOSIS — R269 Unspecified abnormalities of gait and mobility: Secondary | ICD-10-CM

## 2019-04-13 NOTE — Progress Notes (Signed)
Reason for visit: Seizures, stroke event, meningioma  [name] is an 54 y.o. male  History of present illness:  [name] is a 53 year old right-handed black male with a history of a parafalcine meningioma, he will be having MRI of the brain next week to follow-up with this.  He has a history of seizures, he has been off of [place] since 2017 and has not had any recurrence of his seizures.  He does not operate a motor vehicle.  He has had a stroke in 2018 that left him with a significant gait disorder, and a left hemiparesis.  He can walk short distances with a cane, he gets Botox injections through Dr. Ella Bodo on the left arm.  The patient has not had any falls, he does occasionally have some difficulty with choking with swallowing.  He has not had any new medical issues that have come up since last seen.  Past Medical History:  Diagnosis Date  . Colon cancer ([place])    rectal  . Diabetes mellitus without complication ([place])   . Gait abnormality 03/31/2018  . Hemiparesis and speech and language deficit as late effects of stroke ([place]) 03/31/2018  . Hypertension   . Seizure ([place])   . Stroke [facility]) 2018    Past Surgical History:  Procedure Laterality Date  . APPENDECTOMY    . JOINT REPLACEMENT Left 2004   Left hip  . TOTAL HIP ARTHROPLASTY      Family History  Problem Relation Age of Onset  . Hypertension Mother   . Heart disease Mother   . Hyperlipidemia Mother   . Heart failure Mother   . Hypertension Sister   . Hypertension Brother   . Stroke Father   . Hypertension Father   . Colon cancer Maternal Grandmother   . Diabetes Maternal Grandmother     Social history:  reports that he has never smoked. He has never used smokeless tobacco. He reports current alcohol use. He reports that he does not use drugs.   No Known Allergies  Medications:  Prior to Admission medications   Medication Sig Start Date End Date Taking? Authorizing Provider  amLODipine (NORVASC)  10 MG tablet TAKE 1 TABLET BY MOUTH EVERY DAY 02/22/19  Yes Ladell Pier, MD  aspirin 325 MG tablet Take 1 tablet (325 mg total) by mouth daily. 12/19/16  Yes Velvet Bathe, MD  atorvastatin (LIPITOR) 80 MG tablet TAKE 1 TABLET BY MOUTH EVERY DAY 03/17/19  Yes Ladell Pier, MD  carvedilol (COREG) 25 MG tablet TAKE 1 TABLET BY MOUTH TWICE A DAY WITH MEALS 04/11/19  Yes Ladell Pier, MD  indomethacin (INDOCIN) 50 MG capsule TAKE 1 CAPSULE (50 MG TOTAL) BY MOUTH 2 (TWO) TIMES DAILY WITH A MEAL. 03/18/19  Yes Ladell Pier, MD  Melatonin 3 MG TABS Take 3 tablets (9 mg total) by mouth at bedtime. 01/08/17  Yes Angiulli, Lavon Paganini, PA-C  olmesartan (BENICAR) 20 MG tablet TAKE 1 TABLET BY MOUTH EVERY DAY STOP LOSARTAN 11/23/18  Yes Ladell Pier, MD  oxybutynin (DITROPAN) 5 MG tablet TAKE 1 TABLET BY MOUTH TWICE A DAY 10/29/18  Yes Kirsteins, Luanna Salk, MD    ROS:  Out of a complete 14 system review of symptoms, the patient complains only of the following symptoms, and all other reviewed systems are negative.  Walking difficulty Swallowing problems Weakness  Blood pressure 137/90, pulse 72, temperature 97.8 F (36.6 C).  Physical Exam  General: The patient is  alert and cooperative at the time of the examination.  Skin: No significant peripheral edema is noted.   Neurologic Exam  Mental status: The patient is alert and oriented x 3 at the time of the examination. The patient has apparent normal recent and remote memory, with an apparently normal attention span and concentration ability.   Cranial nerves: Facial symmetry is present. Speech is notable for some slight dysarthria, cerebellar speech type pattern, no aphasia. Extraocular movements are full. Visual fields are full.  Motor: The patient has good strength in the right extremities.  The patient has decreased grip with the left hand, he has 4/5 strength proximally with the left arm.  He has 4/5 strength of the left  leg.  Sensory examination: Soft touch sensation is symmetric on the face, arms, and legs.  Coordination: The patient has difficulty performing finger-nose-finger with the left arm, he can perform of the right with slight dysmetria.  He is able to perform heel-to-shin bilaterally, more difficult on the left.  Gait and station: The patient can walk with assistance, gait is wide-based, slow and deliberate, circumduction with the left leg.  Reflexes: Deep tendon reflexes are symmetric.   MRI brain 12/13/2016:   IMPRESSION: Acute RIGHT paramedian brainstem infarct affecting lower pons and upper medulla, is responsible for the reported symptoms. This appears to represent a small vessel insult, as the adjacent major vascular structures appear widely patent.  Heavily calcified mass, probably intra-axial, involves the medial RIGHT hemisphere, slightly increased from 2016. Further assessment could be performed with post infusion imaging as clinically Indicated.  * MRI scan images were reviewed online. I agree with the written report.     Assessment/Plan:  1.  History of parafalcine meningioma  2.  History of seizures, off seizure medications  3.  History of right pontine and medullary stroke, left hemiparesis  The patient appears to be neurologically stable currently, he is to have MRI of the brain done next week.  He fortunately has not had any recurrence of seizures off of seizure medication since 2017.  He is to contact our office if a seizure does recur.  The patient will follow-up otherwise 1 year.  He has seen Dr. Christella Noa from neurosurgery in the past.  C. Floyde Parkins MD 04/13/2019 10:15 AM  [facility] [address] [place], [place] [address]  Phone [phone] Fax [phone]

## 2019-04-15 ENCOUNTER — Ambulatory Visit: Payer: Self-pay | Attending: Internal Medicine

## 2019-04-15 DIAGNOSIS — Z23 Encounter for immunization: Secondary | ICD-10-CM

## 2019-04-15 NOTE — Progress Notes (Signed)
   Covid-19 Vaccination Clinic  Name:  [name]    MRN: [id number] DOB: 10/21/1965  04/15/2019  [name] was observed post Covid-19 immunization for 15 minutes without incident. He was provided with Vaccine Information Sheet and instruction to access the V-Safe system.   [name] was instructed to call 911 with any severe reactions post vaccine: Marland Kitchen Difficulty breathing  . Swelling of face and throat  . A fast heartbeat  . A bad rash all over body  . Dizziness and weakness   Immunizations Administered    Name Date Dose VIS Date Route   Pfizer COVID-19 Vaccine 04/15/2019  4:07 PM 0.3 mL 12/24/2018 Intramuscular   Manufacturer: [place]   Lot: [id number]   [place]: [id number]

## 2019-04-18 ENCOUNTER — Other Ambulatory Visit: Payer: Self-pay | Admitting: Internal Medicine

## 2019-04-18 DIAGNOSIS — I10 Essential (primary) hypertension: Secondary | ICD-10-CM

## 2019-04-18 DIAGNOSIS — I69359 Hemiplegia and hemiparesis following cerebral infarction affecting unspecified side: Secondary | ICD-10-CM

## 2019-04-19 ENCOUNTER — Encounter (HOSPITAL_COMMUNITY): Payer: Self-pay | Admitting: Emergency Medicine

## 2019-04-19 ENCOUNTER — Encounter: Payer: Self-pay | Admitting: Gastroenterology

## 2019-04-19 ENCOUNTER — Other Ambulatory Visit: Payer: Self-pay

## 2019-04-19 ENCOUNTER — Telehealth: Payer: Self-pay | Admitting: Internal Medicine

## 2019-04-19 ENCOUNTER — Encounter: Payer: Self-pay | Admitting: Internal Medicine

## 2019-04-19 ENCOUNTER — Ambulatory Visit: Payer: BC Managed Care – PPO | Attending: Internal Medicine | Admitting: Internal Medicine

## 2019-04-19 ENCOUNTER — Emergency Department (HOSPITAL_COMMUNITY)
Admission: EM | Admit: 2019-04-19 | Discharge: 2019-04-20 | Disposition: A | Discharge status: Home / Self Care | Payer: BC Managed Care – PPO | Attending: Emergency Medicine | Admitting: Emergency Medicine

## 2019-04-19 ENCOUNTER — Ambulatory Visit (INDEPENDENT_AMBULATORY_CARE_PROVIDER_SITE_OTHER): Payer: BC Managed Care – PPO | Admitting: Gastroenterology

## 2019-04-19 VITALS — BP 124/72 | HR 85 | Temp 98.5°F | Ht 64.0 in | Wt 220.0 lb

## 2019-04-19 DIAGNOSIS — C20 Malignant neoplasm of rectum: Secondary | ICD-10-CM

## 2019-04-19 DIAGNOSIS — R29898 Other symptoms and signs involving the musculoskeletal system: Secondary | ICD-10-CM | POA: Diagnosis not present

## 2019-04-19 DIAGNOSIS — D509 Iron deficiency anemia, unspecified: Secondary | ICD-10-CM

## 2019-04-19 DIAGNOSIS — Z01818 Encounter for other preprocedural examination: Secondary | ICD-10-CM

## 2019-04-19 DIAGNOSIS — M79606 Pain in leg, unspecified: Secondary | ICD-10-CM | POA: Diagnosis not present

## 2019-04-19 NOTE — Telephone Encounter (Signed)
Offered tele health appt today as per Dr Cindee Salt agreed

## 2019-04-19 NOTE — Progress Notes (Signed)
C/o experiences decreased mobility and pain in the legs worsening than before/stated you know his case /wanted to notify you / denies any SOB, chest pain, difficulty breathing , tiredness, swelling associated /Wanted to speak to you /concerned about his worsening in mobility Denies any fever

## 2019-04-19 NOTE — ED Triage Notes (Signed)
Pt. Stated, I had the [place] on Sat. And yesterday my legs were hurting and Ive not been able to ambulate very well.

## 2019-04-19 NOTE — Patient Instructions (Signed)
If you are age 53 or older, your body mass index should be between 23-30. Your Body mass index is 37.76 kg/m. If this is out of the aforementioned range listed, please consider follow up with your Primary Care Provider.  If you are age 7 or younger, your body mass index should be between 19-25. Your Body mass index is 37.76 kg/m. If this is out of the aformentioned range listed, please consider follow up with your Primary Care Provider.   You have been scheduled for a colonoscopy. Please follow written instructions given to you at your visit today.  Please pick up your prep supplies at the pharmacy within the next 1-3 days. If you use inhalers (even only as needed), please bring them with you on the day of your procedure.  Due to recent COVID-19 restrictions implemented by our local and state authorities and in an effort to keep both patients and staff as safe as possible, our hospital system now requires COVID-19 testing prior to any scheduled hospital procedure. Please go to our [facility] location drive thru testing site ([address] [place], [place] [address]) on 05-16-19 at  11:10am. There will be multiple testing areas, the first checkpoint being for pre-procedure/surgery testing. Get into the right (yellow) lane that leads to the PAT testing team. You will not be billed at the time of testing but may receive a bill later depending on your insurance. The approximate cost of the test is $100. You must agree to quarantine from the time of your testing until the procedure date on 05-19-19 . This should include staying at home with ONLY the people you live with. Avoid take-out, grocery store shopping or leaving the house for any non-emergent reason. Failure to have your COVID-19 test done on the date and time you have been scheduled will result in cancellation of procedure. Please call our office at [phone] if you have any questions.   Please call your primary care doctor in regards to  the leg weakness that you have been having after COVID vaccine.  Thank you for entrusting me with your care and choosing [facility].  Dr Ardis Hughs

## 2019-04-19 NOTE — Progress Notes (Signed)
HPI: This is a very pleasant 54 year old man who was referred to me by Ladell Pier, MD  to evaluate microcytic anemia, personal history of rectal cancer.    He was diagnosed with rectal adenocarcinoma by Dr. Cannon Kettle at [facility] gastroenterology August 2009.  Dr. Michail Sermon referred him to me for endoscopic ultrasound.  I found the mass to be 4 to 5 cm pedunculated, on a thick broad-based stalk that was confluent with the anal canal.  I recommended surgical transanal excision.  I have not heard about the patient since then.  I reviewed his path report from the transanal excision and this showed it to be a 4.5 cm T3 lesion, adenocarcinoma margins were free of tumor.  Blood work October 2020 shows hemoglobin 12, MCV 76, platelets 237.  Complete metabolic profile was normal.  Today he is here with his wife.  He is in a wheelchair.  He has been wheelchair-bound since his stroke about 3 years ago.  His legs are weaker than usual since he had his Covid vaccination from [employer], vaccine #1 about 2 days ago.  They are achy.  He has not called his primary care physician about it.  He has no troubles with his bowels.  No significant diarrhea or constipation.  He has no overt GI bleeding.  He has not had a colonoscopy since his original one by Dr. Cannon Kettle in 2009 at which time he was diagnosed with rectal cancer.  He and his wife tell me that after he underwent the transanal excision he had chemotherapy as well as radiation therapy to his rectum.  Sounds like he was on oral chemotherapy agent.  I do not think he has followed up with his medical oncologist in about a decade either  Review of systems: Pertinent positive and negative review of systems were noted in the above HPI section. All other review negative.   Past Medical History:  Diagnosis Date  . Colon cancer ([place])    rectal  . Diabetes mellitus without complication ([place])   . Gait abnormality 03/31/2018  . 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 Hypertension   . Seizure ([place])   . Stroke [facility]) 2018    Past Surgical History:  Procedure Laterality Date  . APPENDECTOMY    . JOINT REPLACEMENT Left 2004   Left hip  . TOTAL HIP ARTHROPLASTY      Current Outpatient Medications  Medication Sig Dispense Refill  . amLODipine (NORVASC) 10 MG tablet TAKE 1 TABLET BY MOUTH EVERY DAY 90 tablet 0  . aspirin 325 MG tablet Take 1 tablet (325 mg total) by mouth daily. 30 tablet 0  . atorvastatin (LIPITOR) 80 MG tablet TAKE 1 TABLET BY MOUTH EVERY DAY 90 tablet 0  . carvedilol (COREG) 25 MG tablet TAKE 1 TABLET BY MOUTH TWICE A DAY WITH MEALS 60 tablet 3  . indomethacin (INDOCIN) 50 MG capsule TAKE 1 CAPSULE (50 MG TOTAL) BY MOUTH 2 (TWO) TIMES DAILY WITH A MEAL. 20 capsule 0  . Melatonin 3 MG TABS Take 3 tablets (9 mg total) by mouth at bedtime. 30 tablet 0  . olmesartan (BENICAR) 20 MG tablet TAKE 1 TABLET BY MOUTH EVERY DAY 90 tablet 0  . oxybutynin (DITROPAN) 5 MG tablet TAKE 1 TABLET BY MOUTH TWICE A DAY 180 tablet 2   No current facility-administered medications for this visit.    Allergies as of 04/19/2019  . (No Known  Allergies)    Family History  Problem Relation Age of Onset  . Hypertension Mother   . Heart disease Mother   . Hyperlipidemia Mother   . Heart failure Mother   . Hypertension Sister   . Hypertension Brother   . Stroke Father   . Hypertension Father   . Colon cancer Maternal Grandmother   . Diabetes Maternal Grandmother     Social History   Socioeconomic History  . Marital status: Married    Spouse name: [name]  . Number of children: 1  . Years of education: 53  . Highest education level: Not on file  Occupational History  . Occupation: Group Home  Tobacco Use  . Smoking status: Never Smoker  . Smokeless tobacco: Never Used  Substance and Sexual Activity  . Alcohol use: Yes    Comment: socially  . Drug use: No  . Sexual  activity: Not Currently  Other Topics Concern  . Not on file  Social History Narrative   Patient drinks caffeine a couple times a week.   Patient is right handed.   Lives with wife [name]) and daughter   Social Determinants of Health   Financial Resource Strain:   . Difficulty of Paying Living Expenses:   Food Insecurity:   . Worried About Charity fundraiser in the Last Year:   . Arboriculturist in the Last Year:   Transportation Needs:   . Film/video editor (Medical):   Marland Kitchen Lack of Transportation (Non-Medical):   Physical Activity:   . Days of Exercise per Week:   . Minutes of Exercise per Session:   Stress:   . Feeling of Stress :   Social Connections:   . Frequency of Communication with Friends and Family:   . Frequency of Social Gatherings with Friends and Family:   . Attends Religious Services:   . Active Member of Clubs or Organizations:   . Attends Archivist Meetings:   Marland Kitchen Marital Status:   Intimate Partner Violence:   . Fear of Current or Ex-Partner:   . Emotionally Abused:   Marland Kitchen Physically Abused:   . Sexually Abused:      Physical Exam: BP 124/72   Pulse 85   Temp 98.5 F (36.9 C)   Ht 5\' 4"  (1.626 m)   Wt 220 lb (99.8 kg) Comment: per patient  BMI 37.76 kg/m  Constitutional: Chronically ill-appearing, sitting in a wheelchair Psychiatric: alert and oriented x3 Eyes: extraocular movements intact Mouth: oral pharynx moist, no lesions Neck: supple no lymphadenopathy Cardiovascular: heart regular rate and rhythm Lungs: clear to auscultation bilaterally Abdomen: soft, nontender, nondistended, no obvious ascites, no peritoneal signs, normal bowel sounds Extremities: no lower extremity edema bilaterally Skin: no lesions on visible extremities  Assessment and plan: 54 y.o. male with personal history of rectal adenocarcinoma now with mild microcytic anemia  He has not had a colonoscopy since he was originally diagnosed with colon cancer  about 12 years ago by Dr. Cannon Kettle.  I am concerned that he might have recurrent neoplasm.  I recommended repeat colonoscopy at his soonest convenience.  Since he is relatively immobile, sits in a wheelchair we would be unable to perform that for him in our ambulatory surgical center and so it will be at [facility].  I see no reason for any further blood tests or imaging studies prior to then.  I recommended he call his primary care physician about his leg weakness since his Covid  vaccination this past weekend.    Please see the "Patient Instructions" section for addition details about the plan.   Owens Loffler, MD [facility] 04/19/2019, 1:34 PM  Cc: Ladell Pier, MD  Total time on date of encounter was 45  minutes (this included time spent preparing to see the patient reviewing records; obtaining and/or reviewing separately obtained history; performing a medically appropriate exam and/or evaluation; counseling and educating the patient and family if present; ordering medications, tests or procedures if applicable; and documenting clinical information in the health record).

## 2019-04-19 NOTE — Telephone Encounter (Signed)
Pt got his Covid shot yesterday and this morning he could not get up, please follow up

## 2019-04-19 NOTE — Progress Notes (Signed)
Virtual Visit via Telephone Note Due to current restrictions/limitations of in-office visits due to the COVID-19 pandemic, this scheduled clinical appointment was converted to a telehealth visit  I connected with [name] on 04/19/19 at  4:30 PM EDT by telephone and verified that I am speaking with the correct person using two identifiers. I am in my office.  The patient is at home.  Only the patient, his wife and myself participated in this encounter.  I discussed the limitations, risks, security and privacy concerns of performing an evaluation and management service by telephone and the availability of in person appointments. I also discussed with the patient that there may be a patient responsible charge related to this service. The patient expressed understanding and agreed to proceed.   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  This is an urgent care visit.  Patient wife expressed concern of increased weakness in the patient's lower extremities since he received the first dose of the [employer] vaccine on 04/16/2019 Yesterday pt c/o soreness in RT leg.  Wife gave him Tylenol 500 mg.  Few hrs later, pain no better.  Several hrs after that, he was having difficulty transferring on and off bedside commode from his wheelchair.  He also was having difficulty in transferring from the wheelchair onto the bed.  Even though patient has left spastic hemiparesis from previous stroke, wife reports that he has always been independent in transferring from his wheelchair into and out of the car, on and off the commode and to the bed.  She had to assist him in making these transfers. -Today she had to take him to see the gastroenterologist and noted that he had extreme difficulty again transferring from the wheelchair in and out of the car due to weakness in his legs.  He was dragging  the left leg and had difficulty moving the right leg which he has not experienced before.  Patient denies any changes in strength in the upper extremities.  He has always had a little difficulty with swallowing from his stroke and this has not changed.  He has not had any fever or headaches..      Outpatient Encounter Medications as of 04/19/2019  Medication Sig  . amLODipine (NORVASC) 10 MG tablet TAKE 1 TABLET BY MOUTH EVERY DAY  . aspirin 325 MG tablet Take 1 tablet (325 mg total) by mouth daily.  Marland Kitchen atorvastatin (LIPITOR) 80 MG tablet TAKE 1 TABLET BY MOUTH EVERY DAY  . carvedilol (COREG) 25 MG tablet TAKE 1 TABLET BY MOUTH TWICE A DAY WITH MEALS  . indomethacin (INDOCIN) 50 MG capsule TAKE 1 CAPSULE (50 MG TOTAL) BY MOUTH 2 (TWO) TIMES DAILY WITH A MEAL.  . Melatonin 3 MG TABS Take 3 tablets (9 mg total) by mouth at bedtime.  Marland Kitchen olmesartan (BENICAR) 20 MG tablet TAKE 1 TABLET BY MOUTH EVERY DAY  . oxybutynin (DITROPAN) 5 MG tablet TAKE 1 TABLET BY MOUTH TWICE A DAY   No facility-administered encounter medications on file as of 04/19/2019.    Observations/Objective: No direct observation done as this was a telephone encounter.  Assessment and Plan: 1. Weakness of both legs -Patient with increasing progressive weakness of both legs over the past 24 hours.  He received the first of [employer] vaccine 04/16/2019.  Differential diagnosis could include new CVA, infection or an acute  inflammatory neuropathy that may be associated with  the vaccine.  Recommend that patient be seen in the emergency room.  I will send a message to his neurologist Dr. Jannifer Franklin whom he saw last week prior to receiving the vaccine to see if he can get him in for an urgent care visit.  Patient and spouse expressed understanding of the plan..   Follow Up Instructions:    I discussed the assessment and treatment plan with the patient. The patient was provided an opportunity to ask questions and all were answered. The patient  agreed with the plan and demonstrated an understanding of the instructions.   The patient was advised to call back or seek an in-person evaluation if the symptoms worsen or if the condition fails to improve as anticipated.  I provided 14 minutes of non-face-to-face time during this encounter.   Karle Plumber, MD

## 2019-04-20 ENCOUNTER — Telehealth: Payer: Self-pay | Admitting: Internal Medicine

## 2019-04-20 NOTE — Telephone Encounter (Signed)
Patients wife interpreted to me that the patient legs are becoming more and more immobile. Patients wife stated that the patient received a Covid19 vaccine 04/16/2019 and stated that they believe that is the caused of immobility in his legs. The patients wife requested for a referral to be placed at [facility], ASAP, for her husband to be looked at regarding the leg immobility. Please follow up at your earliest convenience.

## 2019-04-20 NOTE — Telephone Encounter (Signed)
Will forward to pcp

## 2019-04-21 ENCOUNTER — Telehealth: Payer: Self-pay | Admitting: Internal Medicine

## 2019-04-21 ENCOUNTER — Other Ambulatory Visit: Payer: Self-pay

## 2019-04-21 ENCOUNTER — Encounter (HOSPITAL_COMMUNITY): Payer: Self-pay

## 2019-04-21 ENCOUNTER — Emergency Department (HOSPITAL_COMMUNITY)
Admission: EM | Admit: 2019-04-21 | Discharge: 2019-04-21 | Disposition: A | Discharge status: Home / Self Care | Payer: BC Managed Care – PPO | Attending: Emergency Medicine | Admitting: Emergency Medicine

## 2019-04-21 ENCOUNTER — Emergency Department (HOSPITAL_COMMUNITY): Payer: BC Managed Care – PPO

## 2019-04-21 DIAGNOSIS — Z79899 Other long term (current) drug therapy: Secondary | ICD-10-CM | POA: Insufficient documentation

## 2019-04-21 DIAGNOSIS — Z85040 Personal history of malignant carcinoid tumor of rectum: Secondary | ICD-10-CM | POA: Diagnosis not present

## 2019-04-21 DIAGNOSIS — R531 Weakness: Secondary | ICD-10-CM | POA: Diagnosis not present

## 2019-04-21 DIAGNOSIS — E119 Type 2 diabetes mellitus without complications: Secondary | ICD-10-CM | POA: Insufficient documentation

## 2019-04-21 DIAGNOSIS — I69352 Hemiplegia and hemiparesis following cerebral infarction affecting left dominant side: Secondary | ICD-10-CM | POA: Diagnosis not present

## 2019-04-21 DIAGNOSIS — Z7982 Long term (current) use of aspirin: Secondary | ICD-10-CM | POA: Insufficient documentation

## 2019-04-21 DIAGNOSIS — R29898 Other symptoms and signs involving the musculoskeletal system: Secondary | ICD-10-CM

## 2019-04-21 DIAGNOSIS — R29818 Other symptoms and signs involving the nervous system: Secondary | ICD-10-CM | POA: Diagnosis not present

## 2019-04-21 LAB — CBC
HCT: 36.2 % — ABNORMAL LOW (ref 39.0–52.0)
Hemoglobin: 11.6 g/dL — ABNORMAL LOW (ref 13.0–17.0)
MCH: 25.8 pg — ABNORMAL LOW (ref 26.0–34.0)
MCHC: 32 g/dL (ref 30.0–36.0)
MCV: 80.4 fL (ref 80.0–100.0)
Platelets: 212 10*3/uL (ref 150–400)
RBC: 4.5 MIL/uL (ref 4.22–5.81)
RDW: 14.8 % (ref 11.5–15.5)
WBC: 4.4 10*3/uL (ref 4.0–10.5)
nRBC: 0 % (ref 0.0–0.2)

## 2019-04-21 LAB — BASIC METABOLIC PANEL
Anion gap: 11 (ref 5–15)
BUN: 14 mg/dL (ref 6–20)
CO2: 24 mmol/L (ref 22–32)
Calcium: 9.1 mg/dL (ref 8.9–10.3)
Chloride: 108 mmol/L (ref 98–111)
Creatinine, Ser: 1.36 mg/dL — ABNORMAL HIGH (ref 0.61–1.24)
GFR calc Af Amer: >60 mL/min (ref >60)
GFR calc non Af Amer: 59 mL/min — ABNORMAL LOW (ref >60)
Glucose, Bld: 144 mg/dL — ABNORMAL HIGH (ref 70–99)
Potassium: 3.9 mmol/L (ref 3.5–5.1)
Sodium: 143 mmol/L (ref 135–145)

## 2019-04-21 LAB — URINALYSIS, ROUTINE W REFLEX MICROSCOPIC
Bilirubin Urine: NEGATIVE
Glucose, UA: NEGATIVE mg/dL
Hgb urine dipstick: NEGATIVE
Ketones, ur: NEGATIVE mg/dL
Leukocytes,Ua: NEGATIVE
Nitrite: NEGATIVE
Protein, ur: NEGATIVE mg/dL
Specific Gravity, Urine: 1.015 (ref 1.005–1.030)
pH: 5 (ref 5.0–8.0)

## 2019-04-21 LAB — CBG MONITORING, ED: Glucose-Capillary: 117 mg/dL — ABNORMAL HIGH (ref 70–99)

## 2019-04-21 MED ORDER — SODIUM CHLORIDE 0.9% FLUSH: 3.0000 mL | Freq: Once | INTRAVENOUS | Status: DC

## 2019-04-21 NOTE — Discharge Instructions (Signed)
As discussed, your labs were reassuring and your MRI did not show an acute stroke.  It did show a calcified mass which has been unchanged since 2018.  I have included the number for a neurosurgeon.  Please call for further evaluation. You weakness could be related to COVID vaccine.  Please follow-up with PCP within the next week for further evaluation of your weakness.  Return to the ER for new or worsening symptoms.

## 2019-04-21 NOTE — ED Provider Notes (Signed)
[facility] EMERGENCY DEPARTMENT Provider Note   CSN: [id number] Arrival date & time: 04/21/19  1018     History No chief complaint on file.   [name] is a 54 y.o. 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 Patient states he received his Covid vaccine on 04/16/2019 and admits to progressively worsening right lower extremity weakness. Patient was instructed by his PCP to report to the ED for evaluation of possible CVA. Patient is wheelchair-bound, but is typically able to transfer independently; however, since his Covid vaccine patient has been requiring full assist and is dragging his right leg per his wife. Patient denies right upper extremity weakness. Denies recent changes to speech or new facial droop. Patient has residual speech deficits from his previous stroke. Denies fever and chills. Denies abdominal pain, chest pain, shortness of breath, nausea, vomiting, diarrhea. Patient admits to chronic lower extremity edema that is not changed over the past couple weeks.  History obtained from patient and past medical records. No interpreter used during encounter.        Past Medical History:  Diagnosis Date  . Colon cancer ([place]) 2009   rectal  . Diabetes mellitus without complication ([place])   . Gait abnormality 03/31/2018  . Hemiparesis and speech and language deficit as late effects of stroke ([place]) 03/31/2018  . Hypertension   . Seizure ([place])   . Stroke [facility]) 2018    Patient Active Problem List   Diagnosis Date Noted  . Meningioma ([place]) 04/13/2019  . Hemiparesis and speech and language deficit as late effects of stroke ([place]) 03/31/2018  . Gait abnormality 03/31/2018  . Spastic hemiplegia affecting left nondominant side ([place]) 12/18/2017  . Subacromial  impingement of left shoulder 06/09/2017  . Spastic neurogenic bladder 06/09/2017  . Dysarthria as late effect of stroke 04/01/2017  . Neurogenic bladder due to old stroke 12/25/2016  . Right pontine cerebrovascular accident ([place]) 12/18/2016  . Left hemiparesis ([place])   . Diastolic dysfunction   . Dysphagia, post-stroke   . Acute ischemic stroke ([place]) 12/12/2016  . Seizures ([place]) 07/22/2014  . Hypertension 07/22/2014  . Type 2 diabetes mellitus with vascular disease ([place]) 07/22/2014  . Brain mass   . Malignant neoplasm of rectum ([place]) 09/02/2007    Past Surgical History:  Procedure Laterality Date  . APPENDECTOMY    . JOINT REPLACEMENT Left 2004   Left hip  . TOTAL HIP ARTHROPLASTY         Family History  Problem Relation Age of Onset  . Hypertension Mother   . Heart disease Mother   . Hyperlipidemia Mother   . Heart failure Mother   . Hypertension Sister   . Hypertension Brother   . Stroke Father   . Hypertension Father   . Colon cancer Maternal Grandmother   . Diabetes Maternal Grandmother   . Esophageal cancer Neg Hx     Social History   Tobacco Use  . Smoking status: Never Smoker  . Smokeless tobacco: Never Used  Substance Use Topics  . Alcohol use: Not Currently  . Drug use: No    Home Medications Prior to Admission medications   Medication Sig Start Date End Date Taking? Authorizing Provider  amLODipine (NORVASC) 10 MG tablet TAKE 1 TABLET BY MOUTH EVERY DAY 04/19/19   Ladell Pier, MD  aspirin 325 MG tablet Take 1 tablet (325 mg total) by mouth daily. 12/19/16   Velvet Bathe, MD  atorvastatin (LIPITOR) 80 MG tablet TAKE 1 TABLET BY MOUTH EVERY DAY 04/19/19   Ladell Pier, MD  carvedilol (COREG) 25 MG tablet TAKE 1 TABLET BY MOUTH TWICE A DAY WITH MEALS 04/11/19   Ladell Pier, MD  indomethacin (INDOCIN) 50 MG capsule TAKE 1 CAPSULE (50 MG TOTAL) BY MOUTH 2 (TWO) TIMES DAILY WITH A MEAL. 03/18/19   Ladell Pier, MD  Melatonin 3 MG TABS  Take 3 tablets (9 mg total) by mouth at bedtime. 01/08/17   Angiulli, Lavon Paganini, PA-C  olmesartan (BENICAR) 20 MG tablet TAKE 1 TABLET BY MOUTH EVERY DAY 04/13/19   Ladell Pier, MD  oxybutynin (DITROPAN) 5 MG tablet TAKE 1 TABLET BY MOUTH TWICE A DAY 10/29/18   Kirsteins, Luanna Salk, MD    Allergies    Patient has no known allergies.  Review of Systems   Review of Systems  Constitutional: Negative for chills and fever.  Respiratory: Negative for shortness of breath.   Cardiovascular: Positive for leg swelling (chronic). Negative for chest pain.  Gastrointestinal: Negative for abdominal pain, diarrhea, nausea and vomiting.  Musculoskeletal: Positive for arthralgias (right femur).  Neurological: Positive for speech difficulty (chronic status post CVA) and weakness (RLE). Negative for facial asymmetry.  All other systems reviewed and are negative.   Physical Exam Updated Vital Signs BP 110/75   Pulse 69   Temp 98.8 F (37.1 C) (Oral)   Resp (!) 27   SpO2 99%   Physical Exam Vitals and nursing note reviewed.  Constitutional:      General: He is not in acute distress.    Appearance: He is not ill-appearing.  HENT:     Head: Normocephalic.  Eyes:     Pupils: Pupils are equal, round, and reactive to light.  Cardiovascular:     Rate and Rhythm: Normal rate and regular rhythm.     Pulses: Normal pulses.     Heart sounds: Normal heart sounds. No murmur. No friction rub. No gallop.   Pulmonary:     Effort: Pulmonary effort is normal.     Breath sounds: Normal breath sounds.  Abdominal:     General: Abdomen is flat. There is no distension.     Palpations: Abdomen is soft.     Tenderness: There is no abdominal tenderness. There is no guarding or rebound.  Musculoskeletal:     Cervical back: Neck supple.     Comments: Full ROM of right hip, knee, and ankle. 4+/5 RLE strength. 5/5 LLE Distal sensation and pulses intact. 2+ pitting edema bilaterally.   Skin:    General: Skin is  warm and dry.  Neurological:     General: No focal deficit present.     Mental Status: He is alert.     Comments: Slurred speech from previous CVA. Residual left-sided hemiparesis from previous CVA. No facial droop.   Psychiatric:        Mood and Affect: Mood normal.        Behavior: Behavior normal.     ED Results / Procedures / Treatments   Labs (all labs ordered are listed, but only abnormal results are displayed) Labs Reviewed  BASIC METABOLIC PANEL - Abnormal; Notable for the following components:      Result Value   Glucose, Bld 144 (*)    Creatinine, Ser 1.36 (*)    GFR calc non  Af Amer 59 (*)    All other components within normal limits  CBC - Abnormal; Notable for the following components:   Hemoglobin 11.6 (*)    HCT 36.2 (*)    MCH 25.8 (*)    All other components within normal limits  CBG MONITORING, ED - Abnormal; Notable for the following components:   Glucose-Capillary 117 (*)    All other components within normal limits  URINALYSIS, ROUTINE W REFLEX MICROSCOPIC    EKG None  Radiology MR BRAIN WO CONTRAST  Result Date: 04/21/2019 CLINICAL DATA:  Focal neuro deficit. Rule out stroke. History of meningioma. EXAM: MRI HEAD WITHOUT CONTRAST TECHNIQUE: Multiplanar, multiecho pulse sequences of the brain and surrounding structures were obtained without intravenous contrast. COMPARISON:  MRI head 12/13/2016 FINDINGS: Brain: Negative for acute infarct. Right para falcine calcified mass lesion is similar in size to the prior study, measuring approximately 3.5 x 1.6 cm. There is adjacent vasogenic edema in the right frontal white matter which is unchanged. There is mass-effect on the right frontal horn which is unchanged. Extensive encephalomalacia in the anterior frontal lobes bilaterally is chronic and unchanged. No associated craniotomy. Question history of trauma. Chronic microhemorrhage in the right temporal lobe. Vascular: Normal arterial flow voids Skull and upper  cervical spine: No focal skeletal abnormality. Sinuses/Orbits: Negative Other: None IMPRESSION: Negative for acute infarct Right medial frontal calcified mass with surrounding edema is unchanged from 2018. Probable meningioma. Bifrontal encephalomalacia possibly due to prior head trauma. Electronically Signed   By: Franchot Gallo M.D.   On: 04/21/2019 15:03    Procedures Procedures (including critical care time)  Medications Ordered in ED Medications  sodium chloride flush (NS) 0.9 % injection 3 mL (3 mLs Intravenous Not Given 04/21/19 1409)    ED Course  I have reviewed the triage vital signs and the nursing notes.  Pertinent labs & imaging results that were available during my care of the patient were reviewed by me and considered in my medical decision making (see chart for details).    MDM Rules/Calculators/A&P                     54 year old male presents to the ED due to progressively worsening right lower extremity weakness after receiving his Covid vaccine on 04/16/2019.  Patient is a previous history of CVA in 2019 and is residual left-sided hemiparesis and slurred speech.  He was sent here by his PCP to rule out new CVA.  Upon arrival, patient is afebrile, not tachycardic or hypoxic.  Patient no acute distress and non-ill-appearing.  Physical exam reassuring.  Residual left-sided weakness from previous CVA.  Routine labs ordered at triage.  Will obtain MRI to rule out new infarct.  BMP reassuring with mild hyperglycemia 144 and AKI with creatinine at 1.36.  CBC reassuring with no leukocytosis.  UA negative for signs of infection. MRI personally reviewed which demonstrates: Negative for acute infarct    Right medial frontal calcified mass with surrounding edema is  unchanged from 2018. Probable meningioma.    Bifrontal encephalomalacia possibly due to prior head trauma.   Patient given neurosurgery number at discharge for further evaluation of meningioma.  Advised patient to  follow-up with PCP within the next week for further evaluation of right lower extremity weakness. Strict ED precautions discussed with patient. Patient states understanding and agrees to plan. Patient discharged home in no acute distress and stable vitals.  Final Clinical Impression(s) / ED Diagnoses Final diagnoses:  Weakness of  right lower extremity    Rx / DC Orders ED Discharge Orders    None       Karie Kirks 04/21/19 1515    Lucrezia Starch, MD 04/25/19 1534

## 2019-04-21 NOTE — Telephone Encounter (Signed)
Phone call placed to patient and his wife this morning in regards to the phone message from yesterday.  I had a telephone visit with them on 04/19/2019 where patient complained of progressive weakness in both legs since receiving the [place] Covid vaccine on 04/16/2019.  Patient has history of CVA with residual left-sided hemiparesis, calcified brain mass, HTN.  He is wheelchair-bound but is able to transfer independently.  However over the past few days he is requiring full assist and is dragging one leg per his wife.   I recommended that patient be seen in the emergency room to rule out new CVA versus infection versus acute inflammatory neuropathy that may be due to the vaccine.  Patient went to the emergency room on 04/19/2019 and waited for 6 hours and was not seen so he returned home.  Wife reports today that he is still having a lot of weakness in his legs requiring full assist with transfers.  I had sent a message to Dr. Jannifer Franklin who is the patient's neurologist to see whether he can get the patient in urgently.  He to stated that patient should be seen in the emergency room as he likely needs further work-up including imaging of the brain.  Wife plans to take him to [facility] ER today.  I have called ahead to the head nurse at [facility] emergency room to let them know that patient is coming.

## 2019-04-21 NOTE — ED Triage Notes (Signed)
Patient here with ongoing leg weakness and states that its all related to covid vaccine last Saturday. Patient has had stroke in past and has residual speech problems due to same

## 2019-05-06 NOTE — Telephone Encounter (Signed)
Will forward to pcp

## 2019-05-06 NOTE — Telephone Encounter (Signed)
Name and DOB verified. Patient states his arm is achy after taking the vaccine. He has not tried putting a cool or warm compress on the site. Nor has he tried OTC Tylenol or ibuprofen. He was advised to try these measures and to call office if he has ongoing pain. Patient verbalized understanding.   Please advise otherwise.

## 2019-05-06 NOTE — Telephone Encounter (Signed)
Patient called stating he took the COVID vaccine and he is scheduled to take the 2nd dose next week. Patient states his arm is still sore and what like to speak with his PCP. Please f/u

## 2019-05-10 ENCOUNTER — Other Ambulatory Visit: Payer: Self-pay | Admitting: Internal Medicine

## 2019-05-10 ENCOUNTER — Ambulatory Visit: Payer: BC Managed Care – PPO | Attending: Internal Medicine

## 2019-05-10 DIAGNOSIS — Z23 Encounter for immunization: Secondary | ICD-10-CM

## 2019-05-10 DIAGNOSIS — M1A09X0 Idiopathic chronic gout, multiple sites, without tophus (tophi): Secondary | ICD-10-CM

## 2019-05-10 NOTE — Progress Notes (Signed)
   Covid-19 Vaccination Clinic  Name:  [name]    MRN: [id number] DOB: 1965-03-20  05/10/2019  Mr. Delay was observed post Covid-19 immunization for 15 minutes without incident. He was provided with Vaccine Information Sheet and instruction to access the V-Safe system.   [name] was instructed to call 911 with any severe reactions post vaccine: Marland Kitchen Difficulty breathing  . Swelling of face and throat  . A fast heartbeat  . A bad rash all over body  . Dizziness and weakness   Immunizations Administered    Name Date Dose VIS Date Route   Pfizer COVID-19 Vaccine 05/10/2019  8:19 AM 0.3 mL 03/09/2018 Intramuscular   Manufacturer: [place]   Lot: [id number]   [place]: [id number]

## 2019-05-11 ENCOUNTER — Ambulatory Visit: Payer: Self-pay

## 2019-05-15 ENCOUNTER — Other Ambulatory Visit: Payer: Self-pay | Admitting: Internal Medicine

## 2019-05-16 ENCOUNTER — Other Ambulatory Visit (HOSPITAL_COMMUNITY): Payer: Self-pay

## 2019-05-17 ENCOUNTER — Telehealth: Payer: Self-pay | Admitting: Gastroenterology

## 2019-05-17 NOTE — Telephone Encounter (Signed)
Pt is scheduled for a hosp colon 05/19/19.  He stated that he had missed his covid test because he has had both covid vaccines and was not aware of hospital procedure.

## 2019-05-17 NOTE — Telephone Encounter (Signed)
Dr Ardis Hughs the pt called to cancel the appt for 5/6 (hosp colon) He states he missed his COVID test and is unable to go today due to transportation.  He also states he is unable to reschedule at this time.  He needs to get transportation in order and will call back.

## 2019-05-19 ENCOUNTER — Encounter (HOSPITAL_COMMUNITY): Admission: RE | Payer: Self-pay | Source: Home / Self Care

## 2019-05-19 ENCOUNTER — Ambulatory Visit (HOSPITAL_COMMUNITY)
Admission: RE | Admit: 2019-05-19 | Payer: BC Managed Care – PPO | Source: Home / Self Care | Admitting: Gastroenterology

## 2019-05-19 SURGERY — COLONOSCOPY WITH PROPOFOL
Anesthesia: Monitor Anesthesia Care

## 2019-05-27 ENCOUNTER — Ambulatory Visit: Payer: BC Managed Care – PPO | Attending: Internal Medicine | Admitting: Internal Medicine

## 2019-05-27 ENCOUNTER — Other Ambulatory Visit: Payer: Self-pay

## 2019-05-27 DIAGNOSIS — C20 Malignant neoplasm of rectum: Secondary | ICD-10-CM

## 2019-05-27 DIAGNOSIS — Z86011 Personal history of benign neoplasm of the brain: Secondary | ICD-10-CM | POA: Diagnosis not present

## 2019-05-27 DIAGNOSIS — E1159 Type 2 diabetes mellitus with other circulatory complications: Secondary | ICD-10-CM

## 2019-05-27 DIAGNOSIS — I10 Essential (primary) hypertension: Secondary | ICD-10-CM | POA: Diagnosis not present

## 2019-05-27 DIAGNOSIS — D649 Anemia, unspecified: Secondary | ICD-10-CM

## 2019-05-27 NOTE — Progress Notes (Signed)
Virtual Visit via Telephone Note Due to current restrictions/limitations of in-office visits due to the COVID-19 pandemic, this scheduled clinical appointment was converted to a telehealth visit I connected with [name] on 05/27/19 at 1:39 p.m by telephone and verified that I am speaking with the correct person using two identifiers.   I am in my office.  The patient is at home.  Only the patient and myself participated in this encounter. Pt was suppose to be seen in person  I discussed the limitations, risks, security and privacy concerns of performing an evaluation and management service by telephone and the availability of in person appointments. I also discussed with the patient that there may be a patient responsible charge related to this service. The patient expressed understanding and agreed to proceed.   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    Last evaluated 04/2019  On last visit, patient and spouse complained of increased weakness post COVID-19 vaccine.  He was seen in the emergency room where he had an MRI of the head.  This revealed no acute events.  It did show the right medial frontal calcified mass with surrounding edema unchanged from 2018.   -Since last visit he reports that he is back to his baseline.    HTN:  Compliant with meds and salt restriction. BP levels good on ER visits last mth No device to check BP No CP/SOB/LE edema  DM: has a device but does not check BS -doing good with eating habits -no blurred vision.  Due for eye exam  Anemia:  Referral to [facility] in 01/2019.  Saw Dr. Ardis Hughs 04/19/2019 and plan was for c-scopy.  It was scheduled for last wk but he missed the appt for pre-op COVID test so the procedure was canceled.  He needs to call them back to reschedule..  Outpatient Encounter Medications as of 05/27/2019  Medication  Sig  . amLODipine (NORVASC) 10 MG tablet TAKE 1 TABLET BY MOUTH EVERY DAY (Patient taking differently: Take 10 mg by mouth daily. )  . aspirin 325 MG tablet Take 1 tablet (325 mg total) by mouth daily.  Marland Kitchen atorvastatin (LIPITOR) 80 MG tablet TAKE 1 TABLET BY MOUTH EVERY DAY (Patient taking differently: Take 80 mg by mouth daily. )  . carvedilol (COREG) 25 MG tablet TAKE 1 TABLET BY MOUTH TWICE A DAY WITH MEALS (Patient taking differently: Take 25 mg by mouth 2 (two) times daily with a meal. TAKE 1 TABLET BY MOUTH TWICE A DAY WITH MEALS)  . indomethacin (INDOCIN) 50 MG capsule TAKE 1 CAPSULE (50 MG TOTAL) BY MOUTH 2 (TWO) TIMES DAILY WITH A MEAL.  . Melatonin 3 MG TABS Take 3 tablets (9 mg total) by mouth at bedtime.  Marland Kitchen olmesartan (BENICAR) 20 MG tablet TAKE 1 TABLET BY MOUTH EVERY DAY (Patient taking differently: Take 20 mg by mouth daily. TAKE 1 TABLET BY MOUTH EVERY DAY)  . oxybutynin (DITROPAN) 5 MG tablet TAKE 1 TABLET BY MOUTH TWICE A DAY (Patient taking differently: Take 5 mg by mouth 2 (two) times daily. )   No facility-administered encounter medications on file as of 05/27/2019.    Observations/Objective: Lab Results  Component Value Date   WBC 4.4 04/21/2019   HGB 11.6 (L) 04/21/2019   HCT 36.2 (L) 04/21/2019   MCV 80.4 04/21/2019   PLT 212 04/21/2019     Chemistry  Component Value Date/Time   NA 143 04/21/2019 1049   NA 142 11/11/2018 1400   K 3.9 04/21/2019 1049   CL 108 04/21/2019 1049   CO2 24 04/21/2019 1049   BUN 14 04/21/2019 1049   BUN 11 11/11/2018 1400   CREATININE 1.36 (H) 04/21/2019 1049      Component Value Date/Time   CALCIUM 9.1 04/21/2019 1049   ALKPHOS 99 11/11/2018 1400   AST 10 11/11/2018 1400   ALT 18 11/11/2018 1400   BILITOT 0.4 11/11/2018 1400       Assessment and Plan: 1. Type 2 diabetes mellitus with vascular disease ([place]) Encouraged him to continue healthy eating habits.  Asked that he has his wife bring him to the lab for A1c  check. - Ambulatory referral to Ophthalmology - Hemoglobin A1c; Future  2. Essential hypertension At goal.  Continue current medications and low-salt diet  3. History of meningioma of the brain Mass unchanged compared to last MRI done in 2018. Message sent to his neurologist to let him know that he had the MRI of the brain done.  4. Rectal cancer ([place]) 5. Normocytic anemia Strongly encourage patient to follow through in calling back the gastroenterologist to reschedule his colonoscopy given his history of rectal cancer   Follow Up Instructions: 4 mths   I discussed the assessment and treatment plan with the patient. The patient was provided an opportunity to ask questions and all were answered. The patient agreed with the plan and demonstrated an understanding of the instructions.   The patient was advised to call back or seek an in-person evaluation if the symptoms worsen or if the condition fails to improve as anticipated.  I provided 9 minutes of non-face-to-face time during this encounter.   Karle Plumber, MD

## 2019-06-07 ENCOUNTER — Other Ambulatory Visit: Payer: Self-pay | Admitting: Internal Medicine

## 2019-06-07 DIAGNOSIS — M1A09X0 Idiopathic chronic gout, multiple sites, without tophus (tophi): Secondary | ICD-10-CM

## 2019-06-19 ENCOUNTER — Other Ambulatory Visit: Payer: Self-pay | Admitting: Internal Medicine

## 2019-06-19 DIAGNOSIS — I10 Essential (primary) hypertension: Secondary | ICD-10-CM

## 2019-07-19 ENCOUNTER — Other Ambulatory Visit: Payer: Self-pay | Admitting: Internal Medicine

## 2019-07-19 ENCOUNTER — Other Ambulatory Visit: Payer: Self-pay | Admitting: Physical Medicine & Rehabilitation

## 2019-07-19 DIAGNOSIS — I69359 Hemiplegia and hemiparesis following cerebral infarction affecting unspecified side: Secondary | ICD-10-CM

## 2019-07-19 NOTE — Telephone Encounter (Signed)
Requested medications are due for refill today?  Yes  Requested medications are on active medication list?  Yes  Last Refill:   04/19/2019  # 90 with no refills   Future visit scheduled?  Yes in two months.    Notes to Clinic:  Medication failed RX refill protocol due to no labs within the past 360 days.  Last labs were performed on 11/23/2017.

## 2019-07-25 ENCOUNTER — Other Ambulatory Visit: Payer: Self-pay | Admitting: Internal Medicine

## 2019-07-25 DIAGNOSIS — I10 Essential (primary) hypertension: Secondary | ICD-10-CM

## 2019-07-25 DIAGNOSIS — M1A09X0 Idiopathic chronic gout, multiple sites, without tophus (tophi): Secondary | ICD-10-CM

## 2019-07-25 MED ORDER — INDOMETHACIN 50 MG PO CAPS: 50.0000 mg | ORAL_CAPSULE | Freq: Two times a day (BID) | ORAL | 0 refills | Status: DC

## 2019-07-25 NOTE — Addendum Note (Signed)
Addended by: Valli Glance F on: 07/25/2019 02:39 PM   Modules accepted: Orders

## 2019-07-25 NOTE — Telephone Encounter (Signed)
PT need a refill  amLODipine (NORVASC) 10 MG tablet [[id number]]  indomethacin (INDOCIN) 50 MG capsule [[id number]]  olmesartan (BENICAR) 20 MG tablet [[id number]]  [id number] - [place], [place] - [address] AT [address]  [address] [place] [address]  Phone: [phone] Fax: [phone]

## 2019-07-31 ENCOUNTER — Other Ambulatory Visit: Payer: Self-pay | Admitting: Internal Medicine

## 2019-07-31 DIAGNOSIS — I10 Essential (primary) hypertension: Secondary | ICD-10-CM

## 2019-07-31 NOTE — Telephone Encounter (Signed)
Requested Prescriptions  Pending Prescriptions Disp Refills   carvedilol (COREG) 25 MG tablet [Pharmacy Med Name: CARVEDILOL 25 MG TABLET] 180 tablet 1    Sig: TAKE 1 TABLET BY MOUTH TWICE A DAY WITH MEALS     Cardiovascular:  Beta Blockers Passed - 07/31/2019 11:12 AM      Passed - Last BP in normal range    BP Readings from Last 1 Encounters:  04/21/19 107/62         Passed - Last Heart Rate in normal range    Pulse Readings from Last 1 Encounters:  04/21/19 67         Passed - Valid encounter within last 6 months    Recent Outpatient Visits          2 months ago Type 2 diabetes mellitus with vascular disease ([place])   [place] Ladell Pier, MD   3 months ago Weakness of both legs   [place], MD   6 months ago Type 2 diabetes mellitus with vascular disease [facility])   [place], MD   8 months ago Need for influenza vaccination   [place], Jarome Matin, RPH-CPP   8 months ago Chronic gout of multiple sites, unspecified cause   [place], MD      Future Appointments            In 2 months Wynetta Emery, Dalbert Batman, MD [place]

## 2019-08-07 ENCOUNTER — Emergency Department (HOSPITAL_COMMUNITY): Payer: BC Managed Care – PPO

## 2019-08-07 ENCOUNTER — Emergency Department (HOSPITAL_COMMUNITY)
Admission: EM | Admit: 2019-08-07 | Discharge: 2019-08-07 | Disposition: A | Discharge status: Home / Self Care | Payer: BC Managed Care – PPO | Attending: Emergency Medicine | Admitting: Emergency Medicine

## 2019-08-07 ENCOUNTER — Other Ambulatory Visit: Payer: Self-pay

## 2019-08-07 ENCOUNTER — Encounter (HOSPITAL_COMMUNITY): Payer: Self-pay | Admitting: Emergency Medicine

## 2019-08-07 DIAGNOSIS — I10 Essential (primary) hypertension: Secondary | ICD-10-CM | POA: Insufficient documentation

## 2019-08-07 DIAGNOSIS — M25551 Pain in right hip: Secondary | ICD-10-CM | POA: Diagnosis not present

## 2019-08-07 DIAGNOSIS — M79604 Pain in right leg: Secondary | ICD-10-CM | POA: Diagnosis not present

## 2019-08-07 DIAGNOSIS — Z79899 Other long term (current) drug therapy: Secondary | ICD-10-CM | POA: Insufficient documentation

## 2019-08-07 DIAGNOSIS — M1611 Unilateral primary osteoarthritis, right hip: Secondary | ICD-10-CM | POA: Diagnosis not present

## 2019-08-07 DIAGNOSIS — E119 Type 2 diabetes mellitus without complications: Secondary | ICD-10-CM | POA: Insufficient documentation

## 2019-08-07 DIAGNOSIS — M79651 Pain in right thigh: Secondary | ICD-10-CM | POA: Insufficient documentation

## 2019-08-07 DIAGNOSIS — Z7984 Long term (current) use of oral hypoglycemic drugs: Secondary | ICD-10-CM | POA: Insufficient documentation

## 2019-08-07 DIAGNOSIS — M25559 Pain in unspecified hip: Secondary | ICD-10-CM

## 2019-08-07 DIAGNOSIS — Z7982 Long term (current) use of aspirin: Secondary | ICD-10-CM | POA: Insufficient documentation

## 2019-08-07 DIAGNOSIS — T1490XA Injury, unspecified, initial encounter: Secondary | ICD-10-CM

## 2019-08-07 NOTE — ED Triage Notes (Signed)
C/o R upper leg pain since yesterday.  No known injury.  Pt has a history of stroke with L sided deficits.  States he was unable to transfer to bedside commode yesterday due to pain in R leg.

## 2019-08-07 NOTE — ED Provider Notes (Signed)
[facility]ERG[facility] DEPARTMENT Provider Note   CSN: [id number] Arrival date & time: 08/07/19  1552     History Chief Complaint  Patient presents with  . Leg Pain    [name] is a 54 y.o. male with PMHx HTN, diabetes, seizure, stroke with residual hemiparesis of LLE and dysphagia who presents to the ED today with complaint of gradual onset, constant, achy, R upper leg pain that began yesterday. No known injury or trauma. Pt reports he has been taking his usual home medications however no additional medication for pain. Per triage report pt was unable to transfer to bedside commode yesterday due to pain in his R leg. No pain distally to the leg including knee and lower. No weakness/numbness/tingling in his RLE. No other complaints at this time.   The history is provided by the patient and medical records.       Past Medical History:  Diagnosis Date  . Colon cancer ([place]) 2009   rectal  . Diabetes mellitus without complication ([place])   . Gait abnormality 03/31/2018  . Hemiparesis and speech and language deficit as late effects of stroke ([place]) 03/31/2018  . Hypertension   . Seizure ([place])   . Stroke [facility]) 2018    Patient Active Problem List   Diagnosis Date Noted  . Meningioma ([place]) 04/13/2019  . Hemiparesis and speech and language deficit as late effects of stroke ([place]) 03/31/2018  . Gait abnormality 03/31/2018  . Spastic hemiplegia affecting left nondominant side ([place]) 12/18/2017  . Subacromial impingement of left shoulder 06/09/2017  . Spastic neurogenic bladder 06/09/2017  . Dysarthria as late effect of stroke 04/01/2017  . Neurogenic bladder due to old stroke 12/25/2016  . Right pontine cerebrovascular accident ([place]) 12/18/2016  . Left hemiparesis ([place])   . Diastolic dysfunction   . Dysphagia, post-stroke   . Acute ischemic stroke ([place]) 12/12/2016  . Seizures ([place]) 07/22/2014  . Hypertension 07/22/2014  . Type 2 diabetes mellitus with  vascular disease ([place]) 07/22/2014  . Brain mass   . Malignant neoplasm of rectum ([place]) 09/02/2007    Past Surgical History:  Procedure Laterality Date  . APPENDECTOMY    . JOINT REPLACEMENT Left 2004   Left hip  . TOTAL HIP ARTHROPLASTY         Family History  Problem Relation Age of Onset  . Hypertension Mother   . Heart disease Mother   . Hyperlipidemia Mother   . Heart failure Mother   . Hypertension Sister   . Hypertension Brother   . Stroke Father   . Hypertension Father   . Colon cancer Maternal Grandmother   . Diabetes Maternal Grandmother   . Esophageal cancer Neg Hx     Social History   Tobacco Use  . Smoking status: Never Smoker  . Smokeless tobacco: Never Used  Vaping Use  . Vaping Use: Never used  Substance Use Topics  . Alcohol use: Not Currently  . Drug use: No    Home Medications Prior to Admission medications   Medication Sig Start Date End Date Taking? Authorizing Provider  atorvastatin (LIPITOR) 80 MG tablet TAKE 1 TABLET BY MOUTH EVERY DAY 07/22/19   Ladell Pier, MD  amLODipine (NORVASC) 10 MG tablet TAKE 1 TABLET BY MOUTH EVERY DAY 06/21/19   Ladell Pier, MD  aspirin 325 MG tablet Take 1 tablet (325 mg total) by mouth daily. 12/19/16   Velvet Bathe, MD  carvedilol (COREG) 25 MG tablet TAKE 1 TABLET  BY MOUTH TWICE A DAY WITH MEALS 07/31/19   Ladell Pier, MD  indomethacin (INDOCIN) 50 MG capsule Take 1 capsule (50 mg total) by mouth 2 (two) times daily with a meal. 07/25/19   Ladell Pier, MD  Melatonin 3 MG TABS Take 3 tablets (9 mg total) by mouth at bedtime. 01/08/17   Angiulli, Lavon Paganini, PA-C  olmesartan (BENICAR) 20 MG tablet TAKE 1 TABLET BY MOUTH EVERY DAY 06/21/19   Ladell Pier, MD  oxybutynin (DITROPAN) 5 MG tablet TAKE 1 TABLET BY MOUTH TWICE A DAY 07/27/19   Kirsteins, Luanna Salk, MD    Allergies    Patient has no known allergies.  Review of Systems   Review of Systems  Constitutional: Negative for chills  and fever.  Musculoskeletal: Positive for arthralgias. Negative for joint swelling.  Neurological: Negative for weakness and numbness.    Physical Exam Updated Vital Signs BP 92/66 (BP Location: Right Arm)   Pulse 83   Temp 98.9 F (37.2 C) (Oral)   Resp 19   Ht 5\' 4"  (1.626 m)   Wt (!) 95.3 kg   SpO2 99%   BMI 36.05 kg/m   Physical Exam Vitals and nursing note reviewed.  Constitutional:      Appearance: He is not ill-appearing.  HENT:     Head: Normocephalic and atraumatic.  Eyes:     Conjunctiva/sclera: Conjunctivae normal.  Cardiovascular:     Rate and Rhythm: Normal rate and regular rhythm.  Pulmonary:     Effort: Pulmonary effort is normal.     Breath sounds: Normal breath sounds.  Abdominal:     Palpations: Abdomen is soft.     Tenderness: There is no abdominal tenderness.  Musculoskeletal:     Cervical back: Neck supple.     Comments: No overlying skin changes to RLE. No swelling. Mild TTP to the R hip and R inner thigh. ROM intact to hip, knee, and ankle. No TTP distally to R femur. Strength 4/5. Sensation intact throughout. 2+ DP pulse.   Skin:    General: Skin is warm and dry.     Coloration: Skin is not jaundiced.  Neurological:     Mental Status: He is alert.     ED Results / Procedures / Treatments   Labs (all labs ordered are listed, but only abnormal results are displayed) Labs Reviewed - No data to display  EKG None  Radiology DG Pelvis 1-2 Views  Result Date: 08/07/2019 CLINICAL DATA:  Right upper leg pain EXAM: PELVIS - 1-2 VIEW COMPARISON:  None. FINDINGS: The pelvis and sacrum are intact. Left total hip arthroplasty has been performed. There is at least mild degenerative arthritis involving the right hip with osteophyte formation. The right hip is otherwise unremarkable on this single view examination. Vascular calcifications are seen within the pelvis. IMPRESSION: No fracture or dislocation.  Mild right hip degenerative arthritis.  Electronically Signed   By: Fidela Salisbury MD   On: 08/07/2019 18:28   DG FEMUR, MIN 2 VIEWS RIGHT  Result Date: 08/07/2019 CLINICAL DATA:  Right leg pain EXAM: RIGHT FEMUR 2 VIEWS COMPARISON:  None. FINDINGS: Two view radiograph right femur demonstrates normal alignment. No fracture or dislocation. At least mild degenerative arthritis of the right hip. Left knee joint spaces are not well profiled,. No knee effusion. Mild degenerative enthesopathy involving the insertion of the quadriceps tendon upon the patella. IMPRESSION: Mild right hip degenerative arthritis.  No fracture or dislocation. Electronically Signed  By: Fidela Salisbury MD   On: 08/07/2019 18:27    Procedures Procedures (including critical care time)  Medications Ordered in ED Medications - No data to display  ED Course  I have reviewed the triage vital signs and the nursing notes.  Pertinent labs & imaging results that were available during my care of the patient were reviewed by me and considered in my medical decision making (see chart for details).    MDM Rules/Calculators/A&P                          54 year old male who presents to the ED today complaining of atraumatic right upper leg/right hip pain that began yesterday.  States he has been unable to transfer from bedside commode yesterday due to pain however has not taken anything for pain at home.  Patient does have a history of stroke with residual left-sided deficits.  No complaint of weakness, numbness, tingling to right side at this time.  No back pain.  No red flags today for cauda equina, spinal epidural abscess, AAA.  He has no overlying skin changes noted to the right lower extremity, mild tenderness palpation to the hip and proximal femur.  Will obtain x-rays at this time.   X-ray of hip does show mild osteoarthritis.  Have discussed this with patient, recommend Tylenol arthritis over-the-counter as needed for pain.  Patient advised to follow-up with his PCP.   He is in agreement with plan and stable for discharge home.  This note was prepared using Dragon voice recognition software and may include unintentional dictation errors due to the inherent limitations of voice recognition software.  Final Clinical Impression(s) / ED Diagnoses Final diagnoses:  Right leg pain  Hip pain    Rx / DC Orders ED Discharge Orders    None       Discharge Instructions     The xray of your right hip did show some arthritic changes which may be the cause of your pain. I would recommend OTC Tylenol Arthritis for pain. Follow up with your PCP regarding  your ED visit today.   Return to the ED for any worsening symptoms.        Eustaquio Maize, PA-C 08/07/19 1856    Lennice Sites, DO 08/07/19 2035

## 2019-08-07 NOTE — Discharge Instructions (Addendum)
The xray of your right hip did show some arthritic changes which may be the cause of your pain. I would recommend OTC Tylenol Arthritis for pain. Follow up with your PCP regarding  your ED visit today.   Return to the ED for any worsening symptoms.

## 2019-08-19 ENCOUNTER — Other Ambulatory Visit: Payer: Self-pay | Admitting: Internal Medicine

## 2019-08-19 DIAGNOSIS — M1A09X0 Idiopathic chronic gout, multiple sites, without tophus (tophi): Secondary | ICD-10-CM

## 2019-08-19 NOTE — Telephone Encounter (Signed)
Requested medication (s) are due for refill today: Yes  Requested medication (s) are on the active medication list: Yes  Last refill:  07/25/19  Future visit scheduled: Yes  Notes to clinic:  Diagnosis code needed, unable to refill per protocol     Requested Prescriptions  Pending Prescriptions Disp Refills   indomethacin (INDOCIN) 50 MG capsule [Pharmacy Med Name: INDOMETHACIN 50 MG CAPSULE] 20 capsule 0    Sig: Take 1 capsule (50 mg total) by mouth 2 (two) times daily with a meal.      Analgesics:  NSAIDS Failed - 08/19/2019  9:31 AM      Failed - Cr in normal range and within 360 days    Creatinine, Ser  Date Value Ref Range Status  04/21/2019 1.36 (H) 0.61 - 1.24 mg/dL Final          Failed - HGB in normal range and within 360 days    Hemoglobin  Date Value Ref Range Status  04/21/2019 11.6 (L) 13.0 - 17.0 g/dL Final  11/11/2018 12.0 (L) 13.0 - 17.7 g/dL Final   HGB  Date Value Ref Range Status  08/02/2008 14.2 13.0 - 17.1 g/dL Final          Passed - Patient is not pregnant      Passed - Valid encounter within last 12 months    Recent Outpatient Visits           2 months ago Type 2 diabetes mellitus with vascular disease ([place])   [place] Ladell Pier, MD   4 months ago Weakness of both legs   [place], Deborah B, MD   6 months ago Type 2 diabetes mellitus with vascular disease [facility])   [place], MD   9 months ago Need for influenza vaccination   [place], Jarome Matin, RPH-CPP   9 months ago Chronic gout of multiple sites, unspecified cause   [place] Antony Blackbird, MD       Future Appointments             In 1 month Wynetta Emery, Dalbert Batman, MD [place]

## 2019-09-07 ENCOUNTER — Other Ambulatory Visit: Payer: Self-pay | Admitting: Internal Medicine

## 2019-09-07 DIAGNOSIS — M1A09X0 Idiopathic chronic gout, multiple sites, without tophus (tophi): Secondary | ICD-10-CM

## 2019-09-29 ENCOUNTER — Other Ambulatory Visit: Payer: Self-pay

## 2019-09-29 ENCOUNTER — Encounter: Payer: Self-pay | Admitting: Internal Medicine

## 2019-09-29 ENCOUNTER — Ambulatory Visit: Payer: BC Managed Care – PPO | Attending: Internal Medicine | Admitting: Internal Medicine

## 2019-09-29 VITALS — BP 107/70 | HR 67 | Temp 98.5°F | Resp 16

## 2019-09-29 DIAGNOSIS — I6930 Unspecified sequelae of cerebral infarction: Secondary | ICD-10-CM

## 2019-09-29 DIAGNOSIS — D649 Anemia, unspecified: Secondary | ICD-10-CM

## 2019-09-29 DIAGNOSIS — E1159 Type 2 diabetes mellitus with other circulatory complications: Secondary | ICD-10-CM | POA: Diagnosis not present

## 2019-09-29 DIAGNOSIS — I10 Essential (primary) hypertension: Secondary | ICD-10-CM | POA: Diagnosis not present

## 2019-09-29 DIAGNOSIS — Z86011 Personal history of benign neoplasm of the brain: Secondary | ICD-10-CM | POA: Diagnosis not present

## 2019-09-29 DIAGNOSIS — Z2821 Immunization not carried out because of patient refusal: Secondary | ICD-10-CM

## 2019-09-29 DIAGNOSIS — Z85048 Personal history of other malignant neoplasm of rectum, rectosigmoid junction, and anus: Secondary | ICD-10-CM | POA: Diagnosis not present

## 2019-09-29 DIAGNOSIS — M7989 Other specified soft tissue disorders: Secondary | ICD-10-CM

## 2019-09-29 DIAGNOSIS — R600 Localized edema: Secondary | ICD-10-CM

## 2019-09-29 LAB — POCT GLYCOSYLATED HEMOGLOBIN (HGB A1C): HbA1c, POC (prediabetic range): 6.2 % (ref 5.7–6.4)

## 2019-09-29 LAB — GLUCOSE, POCT (MANUAL RESULT ENTRY): POC Glucose: 128 mg/dl — AB (ref 70–99)

## 2019-09-29 NOTE — Patient Instructions (Signed)
I note that you have some swelling of the left hand and in your lower legs.  Try to put 2 or 3 pillows under your left hand/arm to elevated during the day while you are sitting in your wheelchair.  Try to elevate your legs during the day when you are sitting in your wheelchair.  I have resubmitted the referral for you to have your colonoscopy done with Dr. Ardis Hughs.  They will contact your spouse as you requested to make arrangements for the appointment.  I have referred you for your diabetic eye exam.  They will call you with this appointment.

## 2019-09-29 NOTE — Progress Notes (Signed)
Patient ID: [name], male    DOB: May 14, 1965  MRN: [id number]  CC: Diabetes and Hypertension   Subjective: [name] is a 54 y.o. male who presents for chronic ds management His concerns today include:  [name], DM, rectal CA, chronic Ca+ brain mass, sz, brain stem CVA 11/2018with residualLT spastic hemiparesisand dysarthria.  Pt is alone today.  Wife is at another doctor's appt with their daughter  HM:  Declines flu shot.  Still has not called Dr. Ardis Hughs office back to reschedule the colonoscopy from last visit.  States he forgot.  Over due for DM eye exam  DM:  Has been diet control Does not check BS. Does well with eating habits.  Drinks juices and water Results for orders placed or performed in visit on 09/29/19  POCT glucose (manual entry)  Result Value Ref Range   POC Glucose 128 (A) 70 - 99 mg/dl  POCT glycosylated hemoglobin (Hb A1C)  Result Value Ref Range   Hemoglobin A1C     HbA1c POC (<> result, manual entry)     HbA1c, POC (prediabetic range) 6.2 5.7 - 6.4 %   HbA1c, POC (controlled diabetic range)     HYPERTENSION Currently taking: see medication list.  He is on amlodipine, carvedilol, Benicar.  He has his medicines with him today.  He has a med box at home also. Med Adherence: [x]  Yes    []  No Medication side effects: []  Yes    [x]  No Adherence with salt restriction: [x]  Yes    []  No Home Monitoring?: []  Yes    [x]  No, no home device Monitoring Frequency: []  Yes    []  No Home BP results range: []  Yes    []  No SOB? []  Yes    [x]  No Chest Pain?: []  Yes    [x]  No Leg swelling?: [x]  Yes -legs swell during the day and go down at nights.  Has leg rest for his wheelchair but does not use them Headaches?: []  Yes    [x]  No Dizziness? []  Yes    [x]  No Comments:   Brain Mass:  No sz or HA Spends most of the time in his wheelchair.  Has walker; he tells me he uses it every now and then.  MRI of the brain done in April of this year  revealed right frontal calcified mass with surrounding edema unchanged from 2018 probably a meningioma. Patient Active Problem List   Diagnosis Date Noted  . Meningioma ([place]) 04/13/2019  . Hemiparesis and speech and language deficit as late effects of stroke ([place]) 03/31/2018  . Gait abnormality 03/31/2018  . Spastic hemiplegia affecting left nondominant side ([place]) 12/18/2017  . Subacromial impingement of left shoulder 06/09/2017  . Spastic neurogenic bladder 06/09/2017  . Dysarthria as late effect of stroke 04/01/2017  . Neurogenic bladder due to old stroke 12/25/2016  . Right pontine cerebrovascular accident ([place]) 12/18/2016  . Left hemiparesis ([place])   . Diastolic dysfunction   . Dysphagia, post-stroke   . Acute ischemic stroke ([place]) 12/12/2016  . Seizures ([place]) 07/22/2014  . Hypertension 07/22/2014  . Type 2 diabetes mellitus with vascular disease ([place]) 07/22/2014  . Brain mass   . Malignant neoplasm of rectum ([place]) 09/02/2007     Current Outpatient Medications on File Prior to Visit  Medication Sig Dispense Refill  . atorvastatin (LIPITOR) 80 MG tablet TAKE 1 TABLET BY MOUTH EVERY DAY 90 tablet 1  . amLODipine (NORVASC) 10 MG tablet TAKE  1 TABLET BY MOUTH EVERY DAY 90 tablet 1  . aspirin 325 MG tablet Take 1 tablet (325 mg total) by mouth daily. 30 tablet 0  . carvedilol (COREG) 25 MG tablet TAKE 1 TABLET BY MOUTH TWICE A DAY WITH MEALS 180 tablet 1  . indomethacin (INDOCIN) 50 MG capsule TAKE 1 CAPSULE (50 MG TOTAL) BY MOUTH 2 (TWO) TIMES DAILY WITH A MEAL. 20 capsule 0  . Melatonin 3 MG TABS Take 3 tablets (9 mg total) by mouth at bedtime. 30 tablet 0  . olmesartan (BENICAR) 20 MG tablet TAKE 1 TABLET BY MOUTH EVERY DAY 90 tablet 1  . oxybutynin (DITROPAN) 5 MG tablet TAKE 1 TABLET BY MOUTH TWICE A DAY 180 tablet 2   No current facility-administered medications on file prior to visit.    No Known Allergies  Social History   Socioeconomic History  . Marital status:  Married    Spouse name: [name]  . Number of children: 1  . Years of education: 52  . Highest education level: Not on file  Occupational History  . Occupation: disabled  Tobacco Use  . Smoking status: Never Smoker  . Smokeless tobacco: Never Used  Vaping Use  . Vaping Use: Never used  Substance and Sexual Activity  . Alcohol use: Not Currently  . Drug use: No  . Sexual activity: Not Currently  Other Topics Concern  . Not on file  Social History Narrative   Patient drinks caffeine a couple times a week.   Patient is right handed.   Lives with wife [name]) and daughter   Social Determinants of Health   Financial Resource Strain:   . Difficulty of Paying Living Expenses: Not on file  Food Insecurity:   . Worried About Charity fundraiser in the Last Year: Not on file  . Ran Out of Food in the Last Year: Not on file  Transportation Needs:   . Lack of Transportation (Medical): Not on file  . Lack of Transportation (Non-Medical): Not on file  Physical Activity:   . Days of Exercise per Week: Not on file  . Minutes of Exercise per Session: Not on file  Stress:   . Feeling of Stress : Not on file  Social Connections:   . Frequency of Communication with Friends and Family: Not on file  . Frequency of Social Gatherings with Friends and Family: Not on file  . Attends Religious Services: Not on file  . Active Member of Clubs or Organizations: Not on file  . Attends Archivist Meetings: Not on file  . Marital Status: Not on file  Intimate Partner Violence:   . Fear of Current or Ex-Partner: Not on file  . Emotionally Abused: Not on file  . Physically Abused: Not on file  . Sexually Abused: Not on file    Family History  Problem Relation Age of Onset  . Hypertension Mother   . Heart disease Mother   . Hyperlipidemia Mother   . Heart failure Mother   . Hypertension Sister   . Hypertension Brother   . Stroke Father   . Hypertension Father   . Colon cancer  Maternal Grandmother   . Diabetes Maternal Grandmother   . Esophageal cancer Neg Hx     Past Surgical History:  Procedure Laterality Date  . APPENDECTOMY    . JOINT REPLACEMENT Left 2004   Left hip  . TOTAL HIP ARTHROPLASTY      ROS: Review of Systems Negative except  as stated above  PHYSICAL EXAM: BP 107/70   Pulse 67   Temp 98.5 F (36.9 C)   Resp 16   SpO2 97%   Physical Exam   General appearance -patient in wheelchair.  Clothing is clean. Mental status -answers questions appropriately. neck - supple, no significant adenopathy Chest - clear to auscultation, no wheezes, rales or rhonchi, symmetric air entry Heart - normal rate, regular rhythm, normal S1, S2, no murmurs, rubs, clicks or gallops Neurological -he has a left hemiparesis with mild contracture of LT hand musculoskeletal - LT hand -mild edema of dorsal surface.  No erythema Extremities - edema of ankles LT>RT.  Trace to 1 + edema of lower legs. Nails:  Finger nails are over grown CMP Latest Ref Rng & Units 04/21/2019 11/11/2018 01/26/2018  Glucose 70 - 99 mg/dL 144(H) 140(H) -  BUN 6 - 20 mg/dL 14 11 13   Creatinine 0.61 - 1.24 mg/dL 1.36(H) 1.12 1.15  Sodium 135 - 145 mmol/L 143 142 -  Potassium 3.5 - 5.1 mmol/L 3.9 4.1 -  Chloride 98 - 111 mmol/L 108 105 -  CO2 22 - 32 mmol/L 24 23 -  Calcium 8.9 - 10.3 mg/dL 9.1 9.4 -  Total Protein 6.0 - 8.5 g/dL - 6.9 -  Total Bilirubin 0.0 - 1.2 mg/dL - 0.4 -  Alkaline Phos 39 - 117 IU/L - 99 -  AST 0 - 40 IU/L - 10 -  ALT 0 - 44 IU/L - 18 -   Lipid Panel     Component Value Date/Time   CHOL 125 11/23/2017 1706   TRIG 111 11/23/2017 1706   HDL 38 (L) 11/23/2017 1706   CHOLHDL 3.3 11/23/2017 1706   CHOLHDL 3.9 12/13/2016 0421   VLDL 15 12/13/2016 0421   LDLCALC 65 11/23/2017 1706    CBC    Component Value Date/Time   WBC 4.4 04/21/2019 1049   RBC 4.50 04/21/2019 1049   HGB 11.6 (L) 04/21/2019 1049   HGB 12.0 (L) 11/11/2018 1400   HGB 14.2 08/02/2008  1026   HCT 36.2 (L) 04/21/2019 1049   HCT 35.2 (L) 11/11/2018 1400   HCT 41.6 08/02/2008 1026   PLT 212 04/21/2019 1049   PLT 237 11/11/2018 1400   MCV 80.4 04/21/2019 1049   MCV 76 (L) 11/11/2018 1400   MCV 78.4 (L) 08/02/2008 1026   MCH 25.8 (L) 04/21/2019 1049   MCHC 32.0 04/21/2019 1049   RDW 14.8 04/21/2019 1049   RDW 14.2 11/11/2018 1400   RDW 16.3 (H) 08/02/2008 1026   LYMPHSABS 1.3 11/11/2018 1400   LYMPHSABS 0.6 (L) 08/02/2008 1026   MONOABS 0.3 01/01/2017 0624   MONOABS 0.2 08/02/2008 1026   EOSABS 0.1 11/11/2018 1400   BASOSABS 0.0 11/11/2018 1400   BASOSABS 0.0 08/02/2008 1026    ASSESSMENT AND PLAN: 1. Type 2 diabetes mellitus with vascular disease ([place]) Controlled without meds. Encouraged him to continue healthy eating habits.  Avoid sugary drinks.  I have resubmitted referral for ophthalmologist.  Patient told me to have them call his wife with the details of the appointment. - POCT glucose (manual entry) - POCT glycosylated hemoglobin (Hb A1C) - Ambulatory referral to Ophthalmology - Hepatic function panel  2. Essential hypertension At goal.  Continue current medications and low-salt diet. - Hepatic function panel  3. History of meningioma of the brain Stable on imaging studies.  He denies any headaches or dizziness.  4. History of rectal cancer He is agreeable for me to  resubmit referral to Dr. Ardis Hughs to arrange for the colonoscopy.  Request that they call him or his wife with details of the appointment. - Ambulatory referral to Gastroenterology - CBC  5. Normocytic anemia See #4 above.  6. Edema of both legs Encouraged low-salt diet.  However I think some of this is dependent edema.  Encouraged him to elevate the legs during the day while sitting in his wheelchair.  7. Swelling of left hand Advised to put 2 pillows under the left arm during the day while sitting in the wheelchair.  8. History of CVA with residual deficit Continue good blood  pressure control.  Continue aspirin and atorvastatin. - Lipid panel  9. Influenza vaccination declined This was offered.  Patient declined.    Patient was given the opportunity to ask questions.  Patient verbalized understanding of the plan and was able to repeat key elements of the plan.   Orders Placed This Encounter  Procedures  . CBC  . Lipid panel  . Hepatic function panel  . Ambulatory referral to Ophthalmology  . Ambulatory referral to Gastroenterology  . POCT glucose (manual entry)  . POCT glycosylated hemoglobin (Hb A1C)     Requested Prescriptions    No prescriptions requested or ordered in this encounter    Return in about 4 months (around 01/29/2020).  Karle Plumber, MD, FACP

## 2019-09-30 LAB — LIPID PANEL
Chol/HDL Ratio: 2.6 ratio (ref 0.0–5.0)
Cholesterol, Total: 191 mg/dL (ref 100–199)
HDL: 74 mg/dL (ref >39)
LDL Chol Calc (NIH): 89 mg/dL (ref 0–99)
Triglycerides: 166 mg/dL — ABNORMAL HIGH (ref 0–149)
VLDL Cholesterol Cal: 28 mg/dL (ref 5–40)

## 2019-09-30 LAB — HEPATIC FUNCTION PANEL
ALT: 7 IU/L (ref 0–44)
AST: 15 IU/L (ref 0–40)
Albumin: 4.2 g/dL (ref 3.8–4.9)
Alkaline Phosphatase: 68 IU/L (ref 44–121)
Bilirubin Total: 0.4 mg/dL (ref 0.0–1.2)
Bilirubin, Direct: <0.1 mg/dL (ref 0.00–0.40)
Total Protein: 7.6 g/dL (ref 6.0–8.5)

## 2019-09-30 LAB — CBC
Hematocrit: 47 % (ref 37.5–51.0)
Hemoglobin: 15 g/dL (ref 13.0–17.7)
MCH: 29.5 pg (ref 26.6–33.0)
MCHC: 31.9 g/dL (ref 31.5–35.7)
MCV: 92 fL (ref 79–97)
Platelets: 252 10*3/uL (ref 150–450)
RBC: 5.09 x10E6/uL (ref 4.14–5.80)
RDW: 14.5 % (ref 11.6–15.4)
WBC: 6 10*3/uL (ref 3.4–10.8)

## 2019-10-04 ENCOUNTER — Telehealth: Payer: Self-pay

## 2019-10-04 NOTE — Telephone Encounter (Signed)
Contacted pt to go over lab results pt is aware and doesn't have any questions or concerns 

## 2019-10-05 ENCOUNTER — Other Ambulatory Visit: Payer: Self-pay | Admitting: Internal Medicine

## 2019-10-05 DIAGNOSIS — M1A09X0 Idiopathic chronic gout, multiple sites, without tophus (tophi): Secondary | ICD-10-CM

## 2019-12-04 ENCOUNTER — Other Ambulatory Visit: Payer: Self-pay | Admitting: Internal Medicine

## 2019-12-13 ENCOUNTER — Other Ambulatory Visit: Payer: Self-pay | Admitting: Internal Medicine

## 2019-12-13 DIAGNOSIS — M1A09X0 Idiopathic chronic gout, multiple sites, without tophus (tophi): Secondary | ICD-10-CM

## 2020-01-25 ENCOUNTER — Other Ambulatory Visit: Payer: Self-pay | Admitting: Internal Medicine

## 2020-01-25 DIAGNOSIS — M1A09X0 Idiopathic chronic gout, multiple sites, without tophus (tophi): Secondary | ICD-10-CM

## 2020-01-25 DIAGNOSIS — I10 Essential (primary) hypertension: Secondary | ICD-10-CM

## 2020-01-31 ENCOUNTER — Telehealth: Payer: BC Managed Care – PPO | Admitting: Internal Medicine

## 2020-02-07 ENCOUNTER — Other Ambulatory Visit: Payer: Self-pay

## 2020-02-07 ENCOUNTER — Ambulatory Visit: Payer: BC Managed Care – PPO | Attending: Internal Medicine | Admitting: Internal Medicine

## 2020-02-07 DIAGNOSIS — Z86011 Personal history of benign neoplasm of the brain: Secondary | ICD-10-CM

## 2020-02-07 DIAGNOSIS — I10 Essential (primary) hypertension: Secondary | ICD-10-CM

## 2020-02-07 DIAGNOSIS — C20 Malignant neoplasm of rectum: Secondary | ICD-10-CM | POA: Diagnosis not present

## 2020-02-07 DIAGNOSIS — E119 Type 2 diabetes mellitus without complications: Secondary | ICD-10-CM

## 2020-02-07 DIAGNOSIS — Z2821 Immunization not carried out because of patient refusal: Secondary | ICD-10-CM

## 2020-02-07 DIAGNOSIS — I6930 Unspecified sequelae of cerebral infarction: Secondary | ICD-10-CM

## 2020-02-07 MED ORDER — ATORVASTATIN CALCIUM 80 MG PO TABS: 80.0000 mg | ORAL_TABLET | Freq: Every day | ORAL | 1 refills | Status: DC

## 2020-02-07 MED ORDER — CARVEDILOL 25 MG PO TABS: 25.0000 mg | ORAL_TABLET | Freq: Two times a day (BID) | ORAL | 1 refills | Status: DC

## 2020-02-07 NOTE — Progress Notes (Signed)
Virtual Visit via Telephone Note  I connected with [name] on 02/07/20 at  8:30 AM EST by telephone and verified that I am speaking with the correct person using two identifiers.  Location: Patient: home Provider: office  Only the patient and myself participated in this visit. I discussed the limitations, risks, security and privacy concerns of performing an evaluation and management service by telephone and the availability of in person appointments. I also discussed with the patient that there may be a patient responsible charge related to this service. The patient expressed understanding and agreed to proceed.   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 Last seen 09/2019.  Patient reports no major issues since last visit.  DM: Diet controlled.  He tells me his wife checks his blood sugars about twice a month and they have been okay.  Denies any blurred vision.  Referred for eye exam on last visit.  He was called several times and referral closed because they did not return the call.  Patient states that he has an appointment coming up in March of this year.  HTN: Reports compliance with taking medications.  No device to check blood pressure.  Wife does the cooking and limits salt in the foods.  He denies chest pains, shortness of breath, headaches or dizziness.  Swelling in the hand has decreased.  History of rectal cancer: I had given a referral back to Dr. Ardis Hughs on last visit.  They have called patient several times and left voicemail messages.  He has not returned their calls.  Patient states he did receive the calls but they have decided to schedule at a later time.  He denies any blood in the stools.  No longer anemic based on CBC on last visit.  He is requesting refills on carvedilol and atorvastatin.   HM: Defer on getting his colonoscopy.  He is due  for COVID booster.  He states he will get it.  On last visit he declined flu shot.  Still has not received 1.  Outpatient Encounter Medications as of 02/07/2020  Medication Sig  . atorvastatin (LIPITOR) 80 MG tablet TAKE 1 TABLET BY MOUTH EVERY DAY  . amLODipine (NORVASC) 10 MG tablet TAKE 1 TABLET BY MOUTH EVERY DAY  . aspirin 325 MG tablet Take 1 tablet (325 mg total) by mouth daily.  . carvedilol (COREG) 25 MG tablet TAKE 1 TABLET BY MOUTH TWICE A DAY WITH MEALS  . indomethacin (INDOCIN) 50 MG capsule TAKE 1 CAPSULE (50 MG TOTAL) BY MOUTH 2 (TWO) TIMES DAILY WITH A MEAL.  . Melatonin 3 MG TABS Take 3 tablets (9 mg total) by mouth at bedtime.  Marland Kitchen olmesartan (BENICAR) 20 MG tablet TAKE 1 TABLET BY MOUTH EVERY DAY  . oxybutynin (DITROPAN) 5 MG tablet TAKE 1 TABLET BY MOUTH TWICE A DAY   No facility-administered encounter medications on file as of 02/07/2020.      Observations/Objective: Results for orders placed or performed in visit on 09/29/19  CBC  Result Value Ref Range   WBC 6.0 3.4 - 10.8 x10E3/uL   RBC 5.09 4.14 - 5.80 x10E6/uL   Hemoglobin 15.0 13.0 - 17.7 g/dL   Hematocrit 47.0 37.5 - 51.0 %   MCV 92 79 - 97 fL   MCH 29.5 26.6 - 33.0 pg   MCHC 31.9 31.5 - 35.7 g/dL   RDW 14.5 11.6 - 15.4 %  Platelets 252 150 - 450 x10E3/uL  Lipid panel  Result Value Ref Range   Cholesterol, Total 191 100 - 199 mg/dL   Triglycerides 166 (H) 0 - 149 mg/dL   HDL 74 >39 mg/dL   VLDL Cholesterol Cal 28 5 - 40 mg/dL   LDL Chol Calc (NIH) 89 0 - 99 mg/dL   Chol/HDL Ratio 2.6 0.0 - 5.0 ratio  Hepatic function panel  Result Value Ref Range   Total Protein 7.6 6.0 - 8.5 g/dL   Albumin 4.2 3.8 - 4.9 g/dL   Bilirubin Total 0.4 0.0 - 1.2 mg/dL   Bilirubin, Direct <0.10 0.00 - 0.40 mg/dL   Alkaline Phosphatase 68 44 - 121 IU/L   AST 15 0 - 40 IU/L   ALT 7 0 - 44 IU/L  POCT glucose (manual entry)  Result Value Ref Range   POC Glucose 128 (A) 70 - 99 mg/dl  POCT glycosylated hemoglobin (Hb  A1C)  Result Value Ref Range   Hemoglobin A1C     HbA1c POC (<> result, manual entry)     HbA1c, POC (prediabetic range) 6.2 5.7 - 6.4 %   HbA1c, POC (controlled diabetic range)       Assessment and Plan: 1. Essential hypertension Level of control unknown but patient reports compliance with medications.  Continue amlodipine, carvedilol and Benicar. - carvedilol (COREG) 25 MG tablet; Take 1 tablet (25 mg total) by mouth 2 (two) times daily with a meal.  Dispense: 180 tablet; Refill: 1  2. Type 2 diabetes, diet controlled (HCC) Last A1c was less than 7.  Encouraged him to continue healthy eating habits.  3. History of CVA with residual deficit Continue aspirin, Lipitor and good blood pressure control - atorvastatin (LIPITOR) 80 MG tablet; Take 1 tablet (80 mg total) by mouth daily.  Dispense: 90 tablet; Refill: 1  4. Rectal cancer ([place]) Overdue for follow-up with GI.  I have tried several times to get him in with the gastroenterologist but patient has delayed scheduling the appointment.  He states they will schedule when he is ready.  5. History of meningioma of the brain Stable on imaging  6. Influenza vaccination declined   Follow Up Instructions: 4 mths   I discussed the assessment and treatment plan with the patient. The patient was provided an opportunity to ask questions and all were answered. The patient agreed with the plan and demonstrated an understanding of the instructions.   The patient was advised to call back or seek an in-person evaluation if the symptoms worsen or if the condition fails to improve as anticipated.  I provided 9 minutes of non-face-to-face time during this encounter.   Karle Plumber, MD

## 2020-04-16 ENCOUNTER — Ambulatory Visit (INDEPENDENT_AMBULATORY_CARE_PROVIDER_SITE_OTHER): Payer: BC Managed Care – PPO | Admitting: Neurology

## 2020-04-16 ENCOUNTER — Encounter: Payer: Self-pay | Admitting: Neurology

## 2020-04-16 ENCOUNTER — Other Ambulatory Visit: Payer: Self-pay

## 2020-04-16 VITALS — BP 122/72 | HR 76 | Ht 64.0 in | Wt 201.8 lb

## 2020-04-16 DIAGNOSIS — R269 Unspecified abnormalities of gait and mobility: Secondary | ICD-10-CM

## 2020-04-16 DIAGNOSIS — R569 Unspecified convulsions: Secondary | ICD-10-CM

## 2020-04-16 DIAGNOSIS — G8194 Hemiplegia, unspecified affecting left nondominant side: Secondary | ICD-10-CM | POA: Diagnosis not present

## 2020-04-16 DIAGNOSIS — I6350 Cerebral infarction due to unspecified occlusion or stenosis of unspecified cerebral artery: Secondary | ICD-10-CM | POA: Diagnosis not present

## 2020-04-16 NOTE — Patient Instructions (Signed)
Will set up for home health PT/OT Call for any seizures Follow back up with neurosurgery  See you back in 1 year

## 2020-04-16 NOTE — Progress Notes (Signed)
PATIENT: [name] DOB: 04-03-1965  REASON FOR VISIT: follow up HISTORY FROM: patient  HISTORY OF PRESENT ILLNESS: Today 04/16/20 Jatavious Peppard is a 55 year old male with history of parafalcine meningioma and history of seizures.  He has been off [place] since 2017 without recurrence.  Had a stroke in 2018 resulting in significant gait disorder and left hemiparesis.  He was having Botox injections to the left arm, but these have been stopped patient choice, benefited with good range of motion, really no change since stopping, but left fingers are in flexion.  Was in the ER in April 2021 for right-sided weakness, felt as result of the Covid vaccinations, this resolved.  At the time had MRI of the brain, no acute findings, probable meningioma was stable.  They were to follow-up with neurosurgery, but has yet to do this.  In the past has seen Dr. Christella Noa.  Markas stays at home during the day, his wife still works full-time.  Has not had any PT/OT since 2020, he was able to stand, starting to work with assistive device use.  Nothing since COVID.  Interested in getting back into PT/OT.  He is able to stand and pivot to transfer currently.  Otherwise is unable to ambulate.  He is able to do most of his ADLs on his own, can manage during the day while his wife works.  Here today for evaluation accompanied by his wife.  HISTORY 04/13/2019 Dr. Jannifer Franklin: Mr. Dhondt is a 55 year old right-handed black male with a history of a parafalcine meningioma, he will be having MRI of the brain next week to follow-up with this.  He has a history of seizures, he has been off of [place] since 2017 and has not had any recurrence of his seizures.  He does not operate a motor vehicle.  He has had a stroke in 2018 that left him with a significant gait disorder, and a left hemiparesis.  He can walk short distances with a cane, he gets Botox injections through Dr. Ella Bodo on the left arm.  The patient has not had any  falls, he does occasionally have some difficulty with choking with swallowing.  He has not had any new medical issues that have come up since last seen.   REVIEW OF SYSTEMS: Out of a complete 14 system review of symptoms, the patient complains only of the following symptoms, and all other reviewed systems are negative.  See HPI  ALLERGIES: No Known Allergies  HOME MEDICATIONS: Outpatient Medications Prior to Visit  Medication Sig Dispense Refill  . amLODipine (NORVASC) 10 MG tablet TAKE 1 TABLET BY MOUTH EVERY DAY 90 tablet 0  . aspirin 325 MG tablet Take 1 tablet (325 mg total) by mouth daily. 30 tablet 0  . atorvastatin (LIPITOR) 80 MG tablet Take 1 tablet (80 mg total) by mouth daily. 90 tablet 1  . carvedilol (COREG) 25 MG tablet Take 1 tablet (25 mg total) by mouth 2 (two) times daily with a meal. 180 tablet 1  . indomethacin (INDOCIN) 50 MG capsule TAKE 1 CAPSULE (50 MG TOTAL) BY MOUTH 2 (TWO) TIMES DAILY WITH A MEAL. 60 capsule 2  . Melatonin 3 MG TABS Take 3 tablets (9 mg total) by mouth at bedtime. 30 tablet 0  . olmesartan (BENICAR) 20 MG tablet TAKE 1 TABLET BY MOUTH EVERY DAY 90 tablet 0  . oxybutynin (DITROPAN) 5 MG tablet TAKE 1 TABLET BY MOUTH TWICE A DAY 180 tablet 2   No facility-administered medications  prior to visit.    PAST MEDICAL HISTORY: Past Medical History:  Diagnosis Date  . Colon cancer ([place]) 2009   rectal  . Diabetes mellitus without complication ([place])   . Gait abnormality 03/31/2018  . Hemiparesis and speech and language deficit as late effects of stroke ([place]) 03/31/2018  . Hypertension   . Seizure ([place])   . Stroke [facility]) 2018    PAST SURGICAL HISTORY: Past Surgical History:  Procedure Laterality Date  . APPENDECTOMY    . JOINT REPLACEMENT Left 2004   Left hip  . TOTAL HIP ARTHROPLASTY      FAMILY HISTORY: Family History  Problem Relation Age of Onset  . Hypertension Mother   . Heart disease Mother   . Hyperlipidemia Mother   . Heart  failure Mother   . Hypertension Sister   . Hypertension Brother   . Stroke Father   . Hypertension Father   . Colon cancer Maternal Grandmother   . Diabetes Maternal Grandmother   . Esophageal cancer Neg Hx     SOCIAL HISTORY: Social History   Socioeconomic History  . Marital status: Married    Spouse name: [name]  . Number of children: 1  . Years of education: 79  . Highest education level: Not on file  Occupational History  . Occupation: disabled  Tobacco Use  . Smoking status: Never Smoker  . Smokeless tobacco: Never Used  Vaping Use  . Vaping Use: Never used  Substance and Sexual Activity  . Alcohol use: Not Currently  . Drug use: No  . Sexual activity: Not Currently  Other Topics Concern  . Not on file  Social History Narrative   Patient drinks caffeine a couple times a week.   Patient is right handed.   Lives with wife [name]) and daughter   Social Determinants of Health   Financial Resource Strain: Not on file  Food Insecurity: Not on file  Transportation Needs: Not on file  Physical Activity: Not on file  Stress: Not on file  Social Connections: Not on file  Intimate Partner Violence: Not on file   PHYSICAL EXAM  Vitals:   04/16/20 1023  BP: 122/72  Pulse: 76  Weight: 201 lb 12.8 oz (91.5 kg)  Height: 5\' 4"  (1.626 m)   Body mass index is 34.64 kg/m.  Generalized: Well developed, in no acute distress  Neurological examination  Mentation: Alert oriented to time, place, history taking. Follows all commands, speech is mildly dysarthric, not aphasic Cranial nerve II-XII: Pupils were equal round reactive to light. Extraocular movements were full, visual field were full on confrontational test. Facial sensation and strength were normal.  Head turning was normal. Motor: Good strength in the right extremities, decreased handgrip on the left, 3.5-4/5 strength of left upper extremity, 4/5 strength of left lower extremity, left fingers are in  flexion Sensory: Sensory testing is intact to soft touch on all 4 extremities. No evidence of extinction is noted.  Coordination: Difficulty performing finger-nose-finger with the left arm, able to perform heel-to-shin with both legs, but the left leg is slower and intentional Gait and station: In a wheelchair, was not ambulated today Reflexes: Deep tendon reflexes are symmetric   DIAGNOSTIC DATA (LABS, IMAGING, TESTING) - I reviewed patient records, labs, notes, testing and imaging myself where available.  Lab Results  Component Value Date   WBC 6.0 09/29/2019   HGB 15.0 09/29/2019   HCT 47.0 09/29/2019   MCV 92 09/29/2019   PLT 252 09/29/2019  Component Value Date/Time   NA 143 04/21/2019 1049   NA 142 11/11/2018 1400   K 3.9 04/21/2019 1049   CL 108 04/21/2019 1049   CO2 24 04/21/2019 1049   GLUCOSE 144 (H) 04/21/2019 1049   BUN 14 04/21/2019 1049   BUN 11 11/11/2018 1400   CREATININE 1.36 (H) 04/21/2019 1049   CALCIUM 9.1 04/21/2019 1049   PROT 7.6 09/29/2019 1459   ALBUMIN 4.2 09/29/2019 1459   AST 15 09/29/2019 1459   ALT 7 09/29/2019 1459   ALKPHOS 68 09/29/2019 1459   BILITOT 0.4 09/29/2019 1459   GFRNONAA 59 (L) 04/21/2019 1049   GFRAA >60 04/21/2019 1049   Lab Results  Component Value Date   CHOL 191 09/29/2019   HDL 74 09/29/2019   LDLCALC 89 09/29/2019   TRIG 166 (H) 09/29/2019   CHOLHDL 2.6 09/29/2019   Lab Results  Component Value Date   HGBA1C 6.2 09/29/2019   No results found for: VITAMINB12 No results found for: TSH  ASSESSMENT AND PLAN 55 y.o. year old male  has a past medical history of Colon cancer ([place]) (2009), Diabetes mellitus without complication ([place]), Gait abnormality (03/31/2018), Hemiparesis and speech and language deficit as late effects of stroke ([employer]) (03/31/2018), Hypertension, Seizure ([place]), and Stroke ([place]) (2018). here with:  1.  History of parafalcine meningioma  2.  History of seizures, off seizure medications  3.   History of right pontine and medullary stroke, left hemiparesis  He remains overall stable.  He had an MRI done in April 2021, appears overall stable, he was to follow-up with neurosurgery, has yet to do this.  Encouraged to reach out.  He has had no recurrent seizures.  I will refer him for PT/OT to work on gait, and overall strengthening of the left side.  He has stopped his Botox injections of the left upper extremity, he may benefit from these again, as the left fingers are in flexion, especially if he does well with PT/OT.  He will follow-up in 1 year or sooner if needed.  I spent 30 minutes of face-to-face and non-face-to-face time with patient.  This included previsit chart review, lab review, study review, order entry, electronic health record documentation, patient education.  Butler Denmark, AGNP-C, DNP 04/16/2020, 10:53 AM [facility] [address], [place], [place] [address] [phone]

## 2020-04-18 NOTE — Progress Notes (Signed)
I have read the note, and I agree with the clinical assessment and plan.  Edelyn Heidel K Mearle Drew   

## 2020-04-20 ENCOUNTER — Other Ambulatory Visit: Payer: Self-pay | Admitting: Internal Medicine

## 2020-04-20 DIAGNOSIS — Z993 Dependence on wheelchair: Secondary | ICD-10-CM | POA: Diagnosis not present

## 2020-04-20 DIAGNOSIS — Z96642 Presence of left artificial hip joint: Secondary | ICD-10-CM | POA: Diagnosis not present

## 2020-04-20 DIAGNOSIS — I69354 Hemiplegia and hemiparesis following cerebral infarction affecting left non-dominant side: Secondary | ICD-10-CM | POA: Diagnosis not present

## 2020-04-20 DIAGNOSIS — Z7982 Long term (current) use of aspirin: Secondary | ICD-10-CM | POA: Diagnosis not present

## 2020-04-20 DIAGNOSIS — I69398 Other sequelae of cerebral infarction: Secondary | ICD-10-CM | POA: Diagnosis not present

## 2020-04-20 DIAGNOSIS — Z85048 Personal history of other malignant neoplasm of rectum, rectosigmoid junction, and anus: Secondary | ICD-10-CM | POA: Diagnosis not present

## 2020-04-20 DIAGNOSIS — R32 Unspecified urinary incontinence: Secondary | ICD-10-CM | POA: Diagnosis not present

## 2020-04-20 DIAGNOSIS — I69322 Dysarthria following cerebral infarction: Secondary | ICD-10-CM | POA: Diagnosis not present

## 2020-04-20 DIAGNOSIS — I10 Essential (primary) hypertension: Secondary | ICD-10-CM

## 2020-04-20 DIAGNOSIS — I69328 Other speech and language deficits following cerebral infarction: Secondary | ICD-10-CM | POA: Diagnosis not present

## 2020-04-20 DIAGNOSIS — Z79899 Other long term (current) drug therapy: Secondary | ICD-10-CM | POA: Diagnosis not present

## 2020-04-20 DIAGNOSIS — Z8616 Personal history of COVID-19: Secondary | ICD-10-CM | POA: Diagnosis not present

## 2020-04-20 DIAGNOSIS — Z9181 History of falling: Secondary | ICD-10-CM | POA: Diagnosis not present

## 2020-04-20 DIAGNOSIS — E119 Type 2 diabetes mellitus without complications: Secondary | ICD-10-CM | POA: Diagnosis not present

## 2020-04-20 DIAGNOSIS — Z86011 Personal history of benign neoplasm of the brain: Secondary | ICD-10-CM | POA: Diagnosis not present

## 2020-04-20 NOTE — Telephone Encounter (Signed)
Requested Prescriptions  Pending Prescriptions Disp Refills  . amLODipine (NORVASC) 10 MG tablet [Pharmacy Med Name: AMLODIPINE BESYLATE 10 MG TAB] 90 tablet 1    Sig: TAKE 1 TABLET BY MOUTH EVERY DAY     Cardiovascular:  Calcium Channel Blockers Passed - 04/20/2020 12:10 AM      Passed - Last BP in normal range    BP Readings from Last 1 Encounters:  04/16/20 122/72         Passed - Valid encounter within last 6 months    Recent Outpatient Visits          2 months ago Essential hypertension   [place], Neoma Laming B, MD   6 months ago Type 2 diabetes mellitus with vascular disease [facility])   [place] Karle Plumber B, MD   10 months ago Type 2 diabetes mellitus with vascular disease [facility])   [place], MD   1 year ago Weakness of both legs   [place], Deborah B, MD   1 year ago Type 2 diabetes mellitus with vascular disease [facility])   [place], MD      Future Appointments            In 1 month Wynetta Emery, Dalbert Batman, MD [place]

## 2020-04-25 DIAGNOSIS — I69328 Other speech and language deficits following cerebral infarction: Secondary | ICD-10-CM | POA: Diagnosis not present

## 2020-04-25 DIAGNOSIS — Z9181 History of falling: Secondary | ICD-10-CM | POA: Diagnosis not present

## 2020-04-25 DIAGNOSIS — E119 Type 2 diabetes mellitus without complications: Secondary | ICD-10-CM | POA: Diagnosis not present

## 2020-04-25 DIAGNOSIS — I10 Essential (primary) hypertension: Secondary | ICD-10-CM | POA: Diagnosis not present

## 2020-04-25 DIAGNOSIS — Z7982 Long term (current) use of aspirin: Secondary | ICD-10-CM | POA: Diagnosis not present

## 2020-04-25 DIAGNOSIS — Z8616 Personal history of COVID-19: Secondary | ICD-10-CM | POA: Diagnosis not present

## 2020-04-25 DIAGNOSIS — I69398 Other sequelae of cerebral infarction: Secondary | ICD-10-CM | POA: Diagnosis not present

## 2020-04-25 DIAGNOSIS — Z85048 Personal history of other malignant neoplasm of rectum, rectosigmoid junction, and anus: Secondary | ICD-10-CM | POA: Diagnosis not present

## 2020-04-25 DIAGNOSIS — Z96642 Presence of left artificial hip joint: Secondary | ICD-10-CM | POA: Diagnosis not present

## 2020-04-25 DIAGNOSIS — Z993 Dependence on wheelchair: Secondary | ICD-10-CM | POA: Diagnosis not present

## 2020-04-25 DIAGNOSIS — R32 Unspecified urinary incontinence: Secondary | ICD-10-CM | POA: Diagnosis not present

## 2020-04-25 DIAGNOSIS — I69354 Hemiplegia and hemiparesis following cerebral infarction affecting left non-dominant side: Secondary | ICD-10-CM | POA: Diagnosis not present

## 2020-04-25 DIAGNOSIS — Z86011 Personal history of benign neoplasm of the brain: Secondary | ICD-10-CM | POA: Diagnosis not present

## 2020-04-25 DIAGNOSIS — I69322 Dysarthria following cerebral infarction: Secondary | ICD-10-CM | POA: Diagnosis not present

## 2020-04-25 DIAGNOSIS — Z79899 Other long term (current) drug therapy: Secondary | ICD-10-CM | POA: Diagnosis not present

## 2020-04-26 DIAGNOSIS — Z7982 Long term (current) use of aspirin: Secondary | ICD-10-CM | POA: Diagnosis not present

## 2020-04-26 DIAGNOSIS — I69398 Other sequelae of cerebral infarction: Secondary | ICD-10-CM | POA: Diagnosis not present

## 2020-04-26 DIAGNOSIS — R32 Unspecified urinary incontinence: Secondary | ICD-10-CM | POA: Diagnosis not present

## 2020-04-26 DIAGNOSIS — I69354 Hemiplegia and hemiparesis following cerebral infarction affecting left non-dominant side: Secondary | ICD-10-CM | POA: Diagnosis not present

## 2020-04-26 DIAGNOSIS — Z96642 Presence of left artificial hip joint: Secondary | ICD-10-CM | POA: Diagnosis not present

## 2020-04-26 DIAGNOSIS — E119 Type 2 diabetes mellitus without complications: Secondary | ICD-10-CM | POA: Diagnosis not present

## 2020-04-26 DIAGNOSIS — I10 Essential (primary) hypertension: Secondary | ICD-10-CM | POA: Diagnosis not present

## 2020-04-26 DIAGNOSIS — Z8616 Personal history of COVID-19: Secondary | ICD-10-CM | POA: Diagnosis not present

## 2020-04-26 DIAGNOSIS — I69328 Other speech and language deficits following cerebral infarction: Secondary | ICD-10-CM | POA: Diagnosis not present

## 2020-04-26 DIAGNOSIS — Z79899 Other long term (current) drug therapy: Secondary | ICD-10-CM | POA: Diagnosis not present

## 2020-04-26 DIAGNOSIS — Z993 Dependence on wheelchair: Secondary | ICD-10-CM | POA: Diagnosis not present

## 2020-04-26 DIAGNOSIS — I69322 Dysarthria following cerebral infarction: Secondary | ICD-10-CM | POA: Diagnosis not present

## 2020-04-26 DIAGNOSIS — Z86011 Personal history of benign neoplasm of the brain: Secondary | ICD-10-CM | POA: Diagnosis not present

## 2020-04-26 DIAGNOSIS — Z85048 Personal history of other malignant neoplasm of rectum, rectosigmoid junction, and anus: Secondary | ICD-10-CM | POA: Diagnosis not present

## 2020-04-26 DIAGNOSIS — Z9181 History of falling: Secondary | ICD-10-CM | POA: Diagnosis not present

## 2020-04-27 DIAGNOSIS — Z7982 Long term (current) use of aspirin: Secondary | ICD-10-CM | POA: Diagnosis not present

## 2020-04-27 DIAGNOSIS — Z85048 Personal history of other malignant neoplasm of rectum, rectosigmoid junction, and anus: Secondary | ICD-10-CM | POA: Diagnosis not present

## 2020-04-27 DIAGNOSIS — Z9181 History of falling: Secondary | ICD-10-CM | POA: Diagnosis not present

## 2020-04-27 DIAGNOSIS — I69354 Hemiplegia and hemiparesis following cerebral infarction affecting left non-dominant side: Secondary | ICD-10-CM | POA: Diagnosis not present

## 2020-04-27 DIAGNOSIS — Z8616 Personal history of COVID-19: Secondary | ICD-10-CM | POA: Diagnosis not present

## 2020-04-27 DIAGNOSIS — Z96642 Presence of left artificial hip joint: Secondary | ICD-10-CM | POA: Diagnosis not present

## 2020-04-27 DIAGNOSIS — Z86011 Personal history of benign neoplasm of the brain: Secondary | ICD-10-CM | POA: Diagnosis not present

## 2020-04-27 DIAGNOSIS — Z79899 Other long term (current) drug therapy: Secondary | ICD-10-CM | POA: Diagnosis not present

## 2020-04-27 DIAGNOSIS — I69398 Other sequelae of cerebral infarction: Secondary | ICD-10-CM | POA: Diagnosis not present

## 2020-04-27 DIAGNOSIS — E119 Type 2 diabetes mellitus without complications: Secondary | ICD-10-CM | POA: Diagnosis not present

## 2020-04-27 DIAGNOSIS — R32 Unspecified urinary incontinence: Secondary | ICD-10-CM | POA: Diagnosis not present

## 2020-04-27 DIAGNOSIS — I69328 Other speech and language deficits following cerebral infarction: Secondary | ICD-10-CM | POA: Diagnosis not present

## 2020-04-27 DIAGNOSIS — Z993 Dependence on wheelchair: Secondary | ICD-10-CM | POA: Diagnosis not present

## 2020-04-27 DIAGNOSIS — I10 Essential (primary) hypertension: Secondary | ICD-10-CM | POA: Diagnosis not present

## 2020-04-27 DIAGNOSIS — I69322 Dysarthria following cerebral infarction: Secondary | ICD-10-CM | POA: Diagnosis not present

## 2020-04-30 ENCOUNTER — Other Ambulatory Visit: Payer: Self-pay | Admitting: Internal Medicine

## 2020-04-30 DIAGNOSIS — Z79899 Other long term (current) drug therapy: Secondary | ICD-10-CM | POA: Diagnosis not present

## 2020-04-30 DIAGNOSIS — I69328 Other speech and language deficits following cerebral infarction: Secondary | ICD-10-CM | POA: Diagnosis not present

## 2020-04-30 DIAGNOSIS — Z86011 Personal history of benign neoplasm of the brain: Secondary | ICD-10-CM | POA: Diagnosis not present

## 2020-04-30 DIAGNOSIS — Z8616 Personal history of COVID-19: Secondary | ICD-10-CM | POA: Diagnosis not present

## 2020-04-30 DIAGNOSIS — Z993 Dependence on wheelchair: Secondary | ICD-10-CM | POA: Diagnosis not present

## 2020-04-30 DIAGNOSIS — Z96642 Presence of left artificial hip joint: Secondary | ICD-10-CM | POA: Diagnosis not present

## 2020-04-30 DIAGNOSIS — I69398 Other sequelae of cerebral infarction: Secondary | ICD-10-CM | POA: Diagnosis not present

## 2020-04-30 DIAGNOSIS — E119 Type 2 diabetes mellitus without complications: Secondary | ICD-10-CM | POA: Diagnosis not present

## 2020-04-30 DIAGNOSIS — Z85048 Personal history of other malignant neoplasm of rectum, rectosigmoid junction, and anus: Secondary | ICD-10-CM | POA: Diagnosis not present

## 2020-04-30 DIAGNOSIS — Z7982 Long term (current) use of aspirin: Secondary | ICD-10-CM | POA: Diagnosis not present

## 2020-04-30 DIAGNOSIS — R32 Unspecified urinary incontinence: Secondary | ICD-10-CM | POA: Diagnosis not present

## 2020-04-30 DIAGNOSIS — I69322 Dysarthria following cerebral infarction: Secondary | ICD-10-CM | POA: Diagnosis not present

## 2020-04-30 DIAGNOSIS — Z9181 History of falling: Secondary | ICD-10-CM | POA: Diagnosis not present

## 2020-04-30 DIAGNOSIS — I69354 Hemiplegia and hemiparesis following cerebral infarction affecting left non-dominant side: Secondary | ICD-10-CM | POA: Diagnosis not present

## 2020-04-30 DIAGNOSIS — I10 Essential (primary) hypertension: Secondary | ICD-10-CM | POA: Diagnosis not present

## 2020-05-01 DIAGNOSIS — Z86011 Personal history of benign neoplasm of the brain: Secondary | ICD-10-CM | POA: Diagnosis not present

## 2020-05-01 DIAGNOSIS — Z85048 Personal history of other malignant neoplasm of rectum, rectosigmoid junction, and anus: Secondary | ICD-10-CM | POA: Diagnosis not present

## 2020-05-01 DIAGNOSIS — Z9181 History of falling: Secondary | ICD-10-CM | POA: Diagnosis not present

## 2020-05-01 DIAGNOSIS — I69354 Hemiplegia and hemiparesis following cerebral infarction affecting left non-dominant side: Secondary | ICD-10-CM | POA: Diagnosis not present

## 2020-05-01 DIAGNOSIS — I10 Essential (primary) hypertension: Secondary | ICD-10-CM | POA: Diagnosis not present

## 2020-05-01 DIAGNOSIS — I69398 Other sequelae of cerebral infarction: Secondary | ICD-10-CM | POA: Diagnosis not present

## 2020-05-01 DIAGNOSIS — E119 Type 2 diabetes mellitus without complications: Secondary | ICD-10-CM | POA: Diagnosis not present

## 2020-05-01 DIAGNOSIS — Z993 Dependence on wheelchair: Secondary | ICD-10-CM | POA: Diagnosis not present

## 2020-05-01 DIAGNOSIS — Z7982 Long term (current) use of aspirin: Secondary | ICD-10-CM | POA: Diagnosis not present

## 2020-05-01 DIAGNOSIS — Z79899 Other long term (current) drug therapy: Secondary | ICD-10-CM | POA: Diagnosis not present

## 2020-05-01 DIAGNOSIS — R32 Unspecified urinary incontinence: Secondary | ICD-10-CM | POA: Diagnosis not present

## 2020-05-01 DIAGNOSIS — I69328 Other speech and language deficits following cerebral infarction: Secondary | ICD-10-CM | POA: Diagnosis not present

## 2020-05-01 DIAGNOSIS — Z8616 Personal history of COVID-19: Secondary | ICD-10-CM | POA: Diagnosis not present

## 2020-05-01 DIAGNOSIS — I69322 Dysarthria following cerebral infarction: Secondary | ICD-10-CM | POA: Diagnosis not present

## 2020-05-01 DIAGNOSIS — Z96642 Presence of left artificial hip joint: Secondary | ICD-10-CM | POA: Diagnosis not present

## 2020-05-02 ENCOUNTER — Emergency Department (HOSPITAL_COMMUNITY)
Admission: EM | Admit: 2020-05-02 | Discharge: 2020-05-02 | Disposition: A | Discharge status: Home / Self Care | Payer: BC Managed Care – PPO | Attending: Emergency Medicine | Admitting: Emergency Medicine

## 2020-05-02 ENCOUNTER — Emergency Department (HOSPITAL_COMMUNITY): Payer: BC Managed Care – PPO

## 2020-05-02 ENCOUNTER — Other Ambulatory Visit: Payer: Self-pay

## 2020-05-02 DIAGNOSIS — Z96642 Presence of left artificial hip joint: Secondary | ICD-10-CM | POA: Insufficient documentation

## 2020-05-02 DIAGNOSIS — W1839XA Other fall on same level, initial encounter: Secondary | ICD-10-CM | POA: Insufficient documentation

## 2020-05-02 DIAGNOSIS — S99922A Unspecified injury of left foot, initial encounter: Secondary | ICD-10-CM | POA: Diagnosis not present

## 2020-05-02 DIAGNOSIS — M7989 Other specified soft tissue disorders: Secondary | ICD-10-CM | POA: Diagnosis not present

## 2020-05-02 DIAGNOSIS — Z79899 Other long term (current) drug therapy: Secondary | ICD-10-CM | POA: Insufficient documentation

## 2020-05-02 DIAGNOSIS — M79605 Pain in left leg: Secondary | ICD-10-CM | POA: Diagnosis not present

## 2020-05-02 DIAGNOSIS — Z85038 Personal history of other malignant neoplasm of large intestine: Secondary | ICD-10-CM | POA: Insufficient documentation

## 2020-05-02 DIAGNOSIS — S92345A Nondisplaced fracture of fourth metatarsal bone, left foot, initial encounter for closed fracture: Secondary | ICD-10-CM | POA: Insufficient documentation

## 2020-05-02 DIAGNOSIS — Z7982 Long term (current) use of aspirin: Secondary | ICD-10-CM | POA: Insufficient documentation

## 2020-05-02 DIAGNOSIS — Z743 Need for continuous supervision: Secondary | ICD-10-CM | POA: Diagnosis not present

## 2020-05-02 DIAGNOSIS — R531 Weakness: Secondary | ICD-10-CM | POA: Diagnosis not present

## 2020-05-02 DIAGNOSIS — E119 Type 2 diabetes mellitus without complications: Secondary | ICD-10-CM | POA: Insufficient documentation

## 2020-05-02 DIAGNOSIS — R279 Unspecified lack of coordination: Secondary | ICD-10-CM | POA: Diagnosis not present

## 2020-05-02 DIAGNOSIS — W19XXXA Unspecified fall, initial encounter: Secondary | ICD-10-CM | POA: Diagnosis not present

## 2020-05-02 DIAGNOSIS — G459 Transient cerebral ischemic attack, unspecified: Secondary | ICD-10-CM | POA: Diagnosis not present

## 2020-05-02 DIAGNOSIS — G8190 Hemiplegia, unspecified affecting unspecified side: Secondary | ICD-10-CM | POA: Diagnosis not present

## 2020-05-02 DIAGNOSIS — I10 Essential (primary) hypertension: Secondary | ICD-10-CM | POA: Diagnosis not present

## 2020-05-02 NOTE — ED Provider Notes (Signed)
[facility] EMERGENCY DEPARTMENT Provider Note   CSN: [id number] Arrival date & time: 05/02/20  7989     History Chief Complaint  Patient presents with  . Fall    [name] is a 55 y.o. male.  55 yo M w/ PMH below including CVA w/ L sided weakness and speech problems, colorectal CA, T2DM, HTN who p/w fall. Just PTA, pt was transferring from toilet and lost his footing and fell to the ground on his left side. No head injury or LOC. He reports L foot pain since the fall. He has chronic L sided weakness in arm and leg that are unchanged. No hip, knee, neck, back or chest pain. No anticoagulant use.  The history is provided by the patient.  Fall       Past Medical History:  Diagnosis Date  . Colon cancer ([place]) 2009   rectal  . Diabetes mellitus without complication ([place])   . Gait abnormality 03/31/2018  . Hemiparesis and speech and language deficit as late effects of stroke ([place]) 03/31/2018  . Hypertension   . Seizure ([place])   . Stroke [facility]) 2018    Patient Active Problem List   Diagnosis Date Noted  . Edema of both legs 09/29/2019  . Meningioma ([place]) 04/13/2019  . Hemiparesis and speech and language deficit as late effects of stroke ([place]) 03/31/2018  . Gait abnormality 03/31/2018  . Spastic hemiplegia affecting left nondominant side ([place]) 12/18/2017  . Subacromial impingement of left shoulder 06/09/2017  . Spastic neurogenic bladder 06/09/2017  . Dysarthria as late effect of stroke 04/01/2017  . Neurogenic bladder due to old stroke 12/25/2016  . Right pontine cerebrovascular accident ([place]) 12/18/2016  . Left hemiparesis ([place])   . Diastolic dysfunction   . Dysphagia, post-stroke   . Acute ischemic stroke ([place]) 12/12/2016  . Seizures ([place]) 07/22/2014  . Hypertension 07/22/2014  . Type 2 diabetes mellitus with vascular disease ([place]) 07/22/2014  . Brain mass   . Malignant neoplasm of rectum ([place]) 09/02/2007    Past Surgical History:   Procedure Laterality Date  . APPENDECTOMY    . JOINT REPLACEMENT Left 2004   Left hip  . TOTAL HIP ARTHROPLASTY         Family History  Problem Relation Age of Onset  . Hypertension Mother   . Heart disease Mother   . Hyperlipidemia Mother   . Heart failure Mother   . Hypertension Sister   . Hypertension Brother   . Stroke Father   . Hypertension Father   . Colon cancer Maternal Grandmother   . Diabetes Maternal Grandmother   . Esophageal cancer Neg Hx     Social History   Tobacco Use  . Smoking status: Never Smoker  . Smokeless tobacco: Never Used  Vaping Use  . Vaping Use: Never used  Substance Use Topics  . Alcohol use: Not Currently  . Drug use: No    Home Medications Prior to Admission medications   Medication Sig Start Date End Date Taking? Authorizing Provider  amLODipine (NORVASC) 10 MG tablet TAKE 1 TABLET BY MOUTH EVERY DAY 04/20/20   Ladell Pier, MD  aspirin 325 MG tablet Take 1 tablet (325 mg total) by mouth daily. 12/19/16   Velvet Bathe, MD  atorvastatin (LIPITOR) 80 MG tablet Take 1 tablet (80 mg total) by mouth daily. 02/07/20   Ladell Pier, MD  carvedilol (COREG) 25 MG tablet Take 1 tablet (25 mg total) by mouth 2 (two) times  daily with a meal. 02/07/20   Ladell Pier, MD  indomethacin (INDOCIN) 50 MG capsule TAKE 1 CAPSULE (50 MG TOTAL) BY MOUTH 2 (TWO) TIMES DAILY WITH A MEAL. 01/25/20   Ladell Pier, MD  Melatonin 3 MG TABS Take 3 tablets (9 mg total) by mouth at bedtime. 01/08/17   Angiulli, Lavon Paganini, PA-C  olmesartan (BENICAR) 20 MG tablet TAKE 1 TABLET BY MOUTH EVERY DAY 04/30/20   Ladell Pier, MD  oxybutynin (DITROPAN) 5 MG tablet TAKE 1 TABLET BY MOUTH TWICE A DAY 07/27/19   Kirsteins, Luanna Salk, MD    Allergies    Patient has no known allergies.  Review of Systems   Review of Systems All other systems reviewed and are negative except that which was mentioned in HPI  Physical Exam Updated Vital Signs BP  130/81   Pulse 74   Temp 98.5 F (36.9 C) (Oral)   Resp (!) 30   Ht 5\' 4"  (1.626 m)   Wt 91 kg   SpO2 99%   BMI 34.44 kg/m   Physical Exam Constitutional:      General: He is not in acute distress.    Appearance: Normal appearance.  HENT:     Head: Normocephalic and atraumatic.  Eyes:     Conjunctiva/sclera: Conjunctivae normal.  Cardiovascular:     Rate and Rhythm: Normal rate and regular rhythm.     Pulses: Normal pulses.     Heart sounds: Normal heart sounds. No murmur heard.   Pulmonary:     Effort: Pulmonary effort is normal.     Breath sounds: Normal breath sounds.  Chest:     Chest wall: No tenderness.  Abdominal:     General: Abdomen is flat. Bowel sounds are normal. There is no distension.     Palpations: Abdomen is soft.     Tenderness: There is no abdominal tenderness.  Musculoskeletal:     Cervical back: No tenderness.     Right lower leg: No edema.     Comments: L leg edema compared to R; normal ROM at L knee and hip without pain; no tenderness L thigh or tib/fib; mild tenderness of dorsal L foot with chronic edema but no deformity  Skin:    General: Skin is warm and dry.  Neurological:     Mental Status: He is alert and oriented to person, place, and time.     Sensory: No sensory deficit.     Comments: Mild dysarthria but fluent speech, L arm and L leg weakness compared to R w/ L hand contracture  Psychiatric:        Mood and Affect: Mood normal.        Behavior: Behavior normal.     ED Results / Procedures / Treatments   Labs (all labs ordered are listed, but only abnormal results are displayed) Labs Reviewed - No data to display  EKG None  Radiology DG Ankle Complete Left  Result Date: 05/02/2020 CLINICAL DATA:  Fall.  Pain. EXAM: LEFT ANKLE COMPLETE - 3+ VIEW COMPARISON:  No prior. FINDINGS: Diffuse soft tissue swelling. Diffuse osteopenia. No acute bony or joint abnormality identified. No evidence of fracture or dislocation. IMPRESSION:  Diffuse soft tissue swelling. Diffuse osteopenia. No acute bony abnormality identified. Electronically Signed   By: Marcello Moores  Register   On: 05/02/2020 11:00   DG Foot Complete Left  Result Date: 05/02/2020 CLINICAL DATA:  Left foot pain after fall EXAM: LEFT FOOT - COMPLETE 3+ VIEW COMPARISON:  None. FINDINGS: Nondisplaced fracture of the proximal fourth metatarsal. No malalignment seen at the Lisfranc joint. Prominent osteopenia which could make visualization of other fractures not possible. Osteoarthritic changes primarily at the first MTP joint. IMPRESSION: Nondisplaced fracture of the fourth metatarsal at the junction of the shaft and base. Prominent osteopenia. Electronically Signed   By: Monte Fantasia M.D.   On: 05/02/2020 11:04    Procedures Procedures   Medications Ordered in ED Medications - No data to display  ED Course  I have reviewed the triage vital signs and the nursing notes.  Pertinent labs & imaging results that were available during my care of the patient were reviewed by me and considered in my medical decision making (see chart for details).    MDM Rules/Calculators/A&P                          Alert and comfortable on arrival, no areas of pain aside from L foot. XR shows non-displaced 4th metatarsal fx near base. He has wheelchair at home. Placed in post-op shoe and discussed supportive measures, ortho f/u. He voiced understanding.  Final Clinical Impression(s) / ED Diagnoses Final diagnoses:  Closed nondisplaced fracture of fourth metatarsal bone of left foot, initial encounter    Rx / DC Orders ED Discharge Orders    None       Zarek Relph, Wenda Overland, MD 05/02/20 1136

## 2020-05-02 NOTE — ED Notes (Signed)
[facility] called  

## 2020-05-02 NOTE — ED Triage Notes (Signed)
Patient arrives from home BIB [facility] after having a fall yesterday while transferring from the toilet. Pt fell to the ground on the left side and complaining of left ankle pain. Pt has full PNS intact. Pt is wheelchair bound with history of prior stroke 2 years ago which left the pt with deficits to the left arm and leg along with speech deficits. Pt denies any LOC or taking a blood thinner. VSS w/ EMS   BP- 118/70  HR-76  SpO2- 98%  CBG 174

## 2020-05-03 DIAGNOSIS — I10 Essential (primary) hypertension: Secondary | ICD-10-CM | POA: Diagnosis not present

## 2020-05-03 DIAGNOSIS — Z9181 History of falling: Secondary | ICD-10-CM | POA: Diagnosis not present

## 2020-05-03 DIAGNOSIS — I69354 Hemiplegia and hemiparesis following cerebral infarction affecting left non-dominant side: Secondary | ICD-10-CM | POA: Diagnosis not present

## 2020-05-03 DIAGNOSIS — Z8616 Personal history of COVID-19: Secondary | ICD-10-CM | POA: Diagnosis not present

## 2020-05-03 DIAGNOSIS — Z79899 Other long term (current) drug therapy: Secondary | ICD-10-CM | POA: Diagnosis not present

## 2020-05-03 DIAGNOSIS — Z96642 Presence of left artificial hip joint: Secondary | ICD-10-CM | POA: Diagnosis not present

## 2020-05-03 DIAGNOSIS — I69328 Other speech and language deficits following cerebral infarction: Secondary | ICD-10-CM | POA: Diagnosis not present

## 2020-05-03 DIAGNOSIS — Z86011 Personal history of benign neoplasm of the brain: Secondary | ICD-10-CM | POA: Diagnosis not present

## 2020-05-03 DIAGNOSIS — Z85048 Personal history of other malignant neoplasm of rectum, rectosigmoid junction, and anus: Secondary | ICD-10-CM | POA: Diagnosis not present

## 2020-05-03 DIAGNOSIS — I69398 Other sequelae of cerebral infarction: Secondary | ICD-10-CM | POA: Diagnosis not present

## 2020-05-03 DIAGNOSIS — E119 Type 2 diabetes mellitus without complications: Secondary | ICD-10-CM | POA: Diagnosis not present

## 2020-05-03 DIAGNOSIS — I69322 Dysarthria following cerebral infarction: Secondary | ICD-10-CM | POA: Diagnosis not present

## 2020-05-03 DIAGNOSIS — Z993 Dependence on wheelchair: Secondary | ICD-10-CM | POA: Diagnosis not present

## 2020-05-03 DIAGNOSIS — Z7982 Long term (current) use of aspirin: Secondary | ICD-10-CM | POA: Diagnosis not present

## 2020-05-03 DIAGNOSIS — R32 Unspecified urinary incontinence: Secondary | ICD-10-CM | POA: Diagnosis not present

## 2020-05-07 ENCOUNTER — Telehealth: Payer: Self-pay | Admitting: Neurology

## 2020-05-07 DIAGNOSIS — Z993 Dependence on wheelchair: Secondary | ICD-10-CM | POA: Diagnosis not present

## 2020-05-07 DIAGNOSIS — I69328 Other speech and language deficits following cerebral infarction: Secondary | ICD-10-CM | POA: Diagnosis not present

## 2020-05-07 DIAGNOSIS — Z85048 Personal history of other malignant neoplasm of rectum, rectosigmoid junction, and anus: Secondary | ICD-10-CM | POA: Diagnosis not present

## 2020-05-07 DIAGNOSIS — I69354 Hemiplegia and hemiparesis following cerebral infarction affecting left non-dominant side: Secondary | ICD-10-CM | POA: Diagnosis not present

## 2020-05-07 DIAGNOSIS — Z86011 Personal history of benign neoplasm of the brain: Secondary | ICD-10-CM | POA: Diagnosis not present

## 2020-05-07 DIAGNOSIS — E119 Type 2 diabetes mellitus without complications: Secondary | ICD-10-CM | POA: Diagnosis not present

## 2020-05-07 DIAGNOSIS — Z96642 Presence of left artificial hip joint: Secondary | ICD-10-CM | POA: Diagnosis not present

## 2020-05-07 DIAGNOSIS — Z9181 History of falling: Secondary | ICD-10-CM | POA: Diagnosis not present

## 2020-05-07 DIAGNOSIS — I69398 Other sequelae of cerebral infarction: Secondary | ICD-10-CM | POA: Diagnosis not present

## 2020-05-07 DIAGNOSIS — Z8616 Personal history of COVID-19: Secondary | ICD-10-CM | POA: Diagnosis not present

## 2020-05-07 DIAGNOSIS — I69322 Dysarthria following cerebral infarction: Secondary | ICD-10-CM | POA: Diagnosis not present

## 2020-05-07 DIAGNOSIS — Z79899 Other long term (current) drug therapy: Secondary | ICD-10-CM | POA: Diagnosis not present

## 2020-05-07 DIAGNOSIS — I10 Essential (primary) hypertension: Secondary | ICD-10-CM | POA: Diagnosis not present

## 2020-05-07 DIAGNOSIS — R32 Unspecified urinary incontinence: Secondary | ICD-10-CM | POA: Diagnosis not present

## 2020-05-07 DIAGNOSIS — Z7982 Long term (current) use of aspirin: Secondary | ICD-10-CM | POA: Diagnosis not present

## 2020-05-07 NOTE — Telephone Encounter (Addendum)
PT [name] from [phone] states pt fell twice a week ago. Second fall pt fractured his left foot.  Pt was seen by Urgent Care.  Wife will call to schedule a f/u.  PT [name] is asking a referral be sent to [facility] for a left foot AFO

## 2020-05-08 NOTE — Telephone Encounter (Signed)
I called and LMVM for [name] with [facility]. I was trying to get more information, does he need to see orthopedics?  Need evaluation/ surgery??

## 2020-05-08 NOTE — Telephone Encounter (Signed)
Called pt. He stated has appt 05-14-20 with Dr, Katha Hamming, orthopedic trauma specialists on [place] [phone] for L foot fracture.  He had fall due to L foot drop.  Will need L AFO sometime in future.  (due to stroke).  Will need f/u appt to address as the last note 04-16-2020,  Will need up to date F2F for AFO.  FYI.

## 2020-05-08 NOTE — Telephone Encounter (Signed)
Noted  

## 2020-05-08 NOTE — Telephone Encounter (Signed)
Hyman Hopes from [facility] returned call and LVM stating that yes she would like the pt to be referred to Orthopedic if necessary for his foot fracture but she does not know the requirements of his insurance that is why she requested for the office to reach out to him and either schedule a office visit or referral him to an orthopedic. Also pt does eventually need an office visit because pt is needing an AFO for that foot to treat his foot drop. Please advise. [phone]

## 2020-05-09 NOTE — Telephone Encounter (Signed)
Spoke to pt and relayed that when he is able to proceed with AFO for his L foot drop will be glad to assist.  See what ortho wants to do and once ready to proceed let us know and we will make appt.  He verbalized understanding.

## 2020-05-10 DIAGNOSIS — Z79899 Other long term (current) drug therapy: Secondary | ICD-10-CM | POA: Diagnosis not present

## 2020-05-10 DIAGNOSIS — Z7982 Long term (current) use of aspirin: Secondary | ICD-10-CM | POA: Diagnosis not present

## 2020-05-10 DIAGNOSIS — Z9181 History of falling: Secondary | ICD-10-CM | POA: Diagnosis not present

## 2020-05-10 DIAGNOSIS — I69322 Dysarthria following cerebral infarction: Secondary | ICD-10-CM | POA: Diagnosis not present

## 2020-05-10 DIAGNOSIS — Z96642 Presence of left artificial hip joint: Secondary | ICD-10-CM | POA: Diagnosis not present

## 2020-05-10 DIAGNOSIS — I69398 Other sequelae of cerebral infarction: Secondary | ICD-10-CM | POA: Diagnosis not present

## 2020-05-10 DIAGNOSIS — I69328 Other speech and language deficits following cerebral infarction: Secondary | ICD-10-CM | POA: Diagnosis not present

## 2020-05-10 DIAGNOSIS — I10 Essential (primary) hypertension: Secondary | ICD-10-CM | POA: Diagnosis not present

## 2020-05-10 DIAGNOSIS — I69354 Hemiplegia and hemiparesis following cerebral infarction affecting left non-dominant side: Secondary | ICD-10-CM | POA: Diagnosis not present

## 2020-05-10 DIAGNOSIS — R32 Unspecified urinary incontinence: Secondary | ICD-10-CM | POA: Diagnosis not present

## 2020-05-10 DIAGNOSIS — Z85048 Personal history of other malignant neoplasm of rectum, rectosigmoid junction, and anus: Secondary | ICD-10-CM | POA: Diagnosis not present

## 2020-05-10 DIAGNOSIS — E119 Type 2 diabetes mellitus without complications: Secondary | ICD-10-CM | POA: Diagnosis not present

## 2020-05-10 DIAGNOSIS — Z993 Dependence on wheelchair: Secondary | ICD-10-CM | POA: Diagnosis not present

## 2020-05-10 DIAGNOSIS — Z8616 Personal history of COVID-19: Secondary | ICD-10-CM | POA: Diagnosis not present

## 2020-05-10 DIAGNOSIS — Z86011 Personal history of benign neoplasm of the brain: Secondary | ICD-10-CM | POA: Diagnosis not present

## 2020-05-14 DIAGNOSIS — S92342A Displaced fracture of fourth metatarsal bone, left foot, initial encounter for closed fracture: Secondary | ICD-10-CM | POA: Diagnosis not present

## 2020-05-15 ENCOUNTER — Other Ambulatory Visit: Payer: Self-pay | Admitting: Physical Medicine & Rehabilitation

## 2020-05-15 DIAGNOSIS — E119 Type 2 diabetes mellitus without complications: Secondary | ICD-10-CM | POA: Diagnosis not present

## 2020-05-15 DIAGNOSIS — I69328 Other speech and language deficits following cerebral infarction: Secondary | ICD-10-CM | POA: Diagnosis not present

## 2020-05-15 DIAGNOSIS — I69354 Hemiplegia and hemiparesis following cerebral infarction affecting left non-dominant side: Secondary | ICD-10-CM | POA: Diagnosis not present

## 2020-05-15 DIAGNOSIS — Z7982 Long term (current) use of aspirin: Secondary | ICD-10-CM | POA: Diagnosis not present

## 2020-05-15 DIAGNOSIS — I69398 Other sequelae of cerebral infarction: Secondary | ICD-10-CM | POA: Diagnosis not present

## 2020-05-15 DIAGNOSIS — R32 Unspecified urinary incontinence: Secondary | ICD-10-CM | POA: Diagnosis not present

## 2020-05-15 DIAGNOSIS — Z96642 Presence of left artificial hip joint: Secondary | ICD-10-CM | POA: Diagnosis not present

## 2020-05-15 DIAGNOSIS — Z8616 Personal history of COVID-19: Secondary | ICD-10-CM | POA: Diagnosis not present

## 2020-05-15 DIAGNOSIS — Z86011 Personal history of benign neoplasm of the brain: Secondary | ICD-10-CM | POA: Diagnosis not present

## 2020-05-15 DIAGNOSIS — Z85048 Personal history of other malignant neoplasm of rectum, rectosigmoid junction, and anus: Secondary | ICD-10-CM | POA: Diagnosis not present

## 2020-05-15 DIAGNOSIS — I10 Essential (primary) hypertension: Secondary | ICD-10-CM | POA: Diagnosis not present

## 2020-05-15 DIAGNOSIS — Z993 Dependence on wheelchair: Secondary | ICD-10-CM | POA: Diagnosis not present

## 2020-05-15 DIAGNOSIS — Z9181 History of falling: Secondary | ICD-10-CM | POA: Diagnosis not present

## 2020-05-15 DIAGNOSIS — I69322 Dysarthria following cerebral infarction: Secondary | ICD-10-CM | POA: Diagnosis not present

## 2020-05-15 DIAGNOSIS — Z79899 Other long term (current) drug therapy: Secondary | ICD-10-CM | POA: Diagnosis not present

## 2020-05-17 DIAGNOSIS — Z9181 History of falling: Secondary | ICD-10-CM | POA: Diagnosis not present

## 2020-05-17 DIAGNOSIS — I10 Essential (primary) hypertension: Secondary | ICD-10-CM | POA: Diagnosis not present

## 2020-05-17 DIAGNOSIS — Z79899 Other long term (current) drug therapy: Secondary | ICD-10-CM | POA: Diagnosis not present

## 2020-05-17 DIAGNOSIS — I69398 Other sequelae of cerebral infarction: Secondary | ICD-10-CM | POA: Diagnosis not present

## 2020-05-17 DIAGNOSIS — I69328 Other speech and language deficits following cerebral infarction: Secondary | ICD-10-CM | POA: Diagnosis not present

## 2020-05-17 DIAGNOSIS — I69354 Hemiplegia and hemiparesis following cerebral infarction affecting left non-dominant side: Secondary | ICD-10-CM | POA: Diagnosis not present

## 2020-05-17 DIAGNOSIS — Z993 Dependence on wheelchair: Secondary | ICD-10-CM | POA: Diagnosis not present

## 2020-05-17 DIAGNOSIS — Z7982 Long term (current) use of aspirin: Secondary | ICD-10-CM | POA: Diagnosis not present

## 2020-05-17 DIAGNOSIS — R32 Unspecified urinary incontinence: Secondary | ICD-10-CM | POA: Diagnosis not present

## 2020-05-17 DIAGNOSIS — Z85048 Personal history of other malignant neoplasm of rectum, rectosigmoid junction, and anus: Secondary | ICD-10-CM | POA: Diagnosis not present

## 2020-05-17 DIAGNOSIS — I69322 Dysarthria following cerebral infarction: Secondary | ICD-10-CM | POA: Diagnosis not present

## 2020-05-17 DIAGNOSIS — E119 Type 2 diabetes mellitus without complications: Secondary | ICD-10-CM | POA: Diagnosis not present

## 2020-05-17 DIAGNOSIS — Z96642 Presence of left artificial hip joint: Secondary | ICD-10-CM | POA: Diagnosis not present

## 2020-05-17 DIAGNOSIS — Z8616 Personal history of COVID-19: Secondary | ICD-10-CM | POA: Diagnosis not present

## 2020-05-17 DIAGNOSIS — Z86011 Personal history of benign neoplasm of the brain: Secondary | ICD-10-CM | POA: Diagnosis not present

## 2020-05-18 ENCOUNTER — Telehealth: Payer: Self-pay | Admitting: Internal Medicine

## 2020-05-18 NOTE — Telephone Encounter (Signed)
Copied from [place] [phone]. Topic: General - Inquiry >> May 18, 2020 11:27 AM Greggory Keen D wrote: Reason for CRM: Pt 's wife called saying she fills her husband needs to be in a rehabilation center.  He has fell a couple of time and recently broke his foot.  She feels he needs around the care and PT for getting more mobile.  CB  [phone]

## 2020-05-21 NOTE — Telephone Encounter (Signed)
This is the patient I needed assistance with.

## 2020-05-21 NOTE — Telephone Encounter (Addendum)
Attempted to contact patient's wife, [name], # [phone] to discuss patient's care needs.  Message left with call back requested to this CM.

## 2020-05-22 ENCOUNTER — Telehealth: Payer: Self-pay | Admitting: Neurology

## 2020-05-22 DIAGNOSIS — Z96642 Presence of left artificial hip joint: Secondary | ICD-10-CM | POA: Diagnosis not present

## 2020-05-22 DIAGNOSIS — Z7982 Long term (current) use of aspirin: Secondary | ICD-10-CM | POA: Diagnosis not present

## 2020-05-22 DIAGNOSIS — Z85048 Personal history of other malignant neoplasm of rectum, rectosigmoid junction, and anus: Secondary | ICD-10-CM | POA: Diagnosis not present

## 2020-05-22 DIAGNOSIS — I10 Essential (primary) hypertension: Secondary | ICD-10-CM | POA: Diagnosis not present

## 2020-05-22 DIAGNOSIS — I69398 Other sequelae of cerebral infarction: Secondary | ICD-10-CM | POA: Diagnosis not present

## 2020-05-22 DIAGNOSIS — I69322 Dysarthria following cerebral infarction: Secondary | ICD-10-CM | POA: Diagnosis not present

## 2020-05-22 DIAGNOSIS — Z86011 Personal history of benign neoplasm of the brain: Secondary | ICD-10-CM | POA: Diagnosis not present

## 2020-05-22 DIAGNOSIS — Z79899 Other long term (current) drug therapy: Secondary | ICD-10-CM | POA: Diagnosis not present

## 2020-05-22 DIAGNOSIS — Z9181 History of falling: Secondary | ICD-10-CM | POA: Diagnosis not present

## 2020-05-22 DIAGNOSIS — E119 Type 2 diabetes mellitus without complications: Secondary | ICD-10-CM | POA: Diagnosis not present

## 2020-05-22 DIAGNOSIS — I69354 Hemiplegia and hemiparesis following cerebral infarction affecting left non-dominant side: Secondary | ICD-10-CM | POA: Diagnosis not present

## 2020-05-22 DIAGNOSIS — I69328 Other speech and language deficits following cerebral infarction: Secondary | ICD-10-CM | POA: Diagnosis not present

## 2020-05-22 DIAGNOSIS — Z8616 Personal history of COVID-19: Secondary | ICD-10-CM | POA: Diagnosis not present

## 2020-05-22 DIAGNOSIS — Z993 Dependence on wheelchair: Secondary | ICD-10-CM | POA: Diagnosis not present

## 2020-05-22 DIAGNOSIS — R32 Unspecified urinary incontinence: Secondary | ICD-10-CM | POA: Diagnosis not present

## 2020-05-22 NOTE — Telephone Encounter (Signed)
PT [name] @ [facility] has called for verbal orders for Home health PT 1 week 4 and Orders for Home Health aid 2 week 2 PT [name] can be reached at [phone]

## 2020-05-22 NOTE — Telephone Encounter (Signed)
[name] returning your call during lunch.  Please call back  [phone]

## 2020-05-22 NOTE — Telephone Encounter (Signed)
LMVM for [name] ok'd orders verbally listed below.  She is to call back if questions.

## 2020-05-22 NOTE — Telephone Encounter (Signed)
Call returned to [place].  She explained that they received authorization from the insurance company to continue with PT once a week for strengthening and they have orders for home health aide twice a week x 2 weeks to assist with personal hygiene.   They have been receiving the home health orders from neurology.  [name] believes that the patient has excellent rehab potential and would benefit from inpatient rehab. However, it would be best to wait for approval from orthopedics to increase weight bearing on his left foot as he would not receive the maximum benefit from inpatient therapy at this time.  She explained that their home health goal for the patient is to be able to walk in his house. [name] also noted that it can be difficult to get a patient placed in rehab from the home.   [name] said she will follow up with patient's wife to inform her that we spoke.  She will also suggest that PACE might be an option for him or outpatient therapy with [facility] services if he is not accepted at an inpatient rehab facility.

## 2020-05-22 NOTE — Telephone Encounter (Signed)
Call returned to patient's wife, [name]. She expressed her concerns about patient's decreased functional status. In the past 3 weeks, he has experienced 3 falls.  One fall resulted in a fracture left foot. A fall last week was unwitnessed and EMS had to be called to help get him up. He is currently receiving home PT through [facility].   [name] would like him to receive intensive therapy with the goal of him being independent without the need for a wheelchair.  He has basically been relying on the wheelchair for mobility since his stroke in 2019. At that time he was not able to graduate to a cane.  She is concerned about his safety and about him being alone at home while she is working and managing household responsibilities.   She explained that she would like him to have in- home services for supervision with therapy treatments daily or short term placement in a rehab facility where he would have 24/7 supervision and intensive therapies. She has not yet discussed these thoughts with the neurologist.   He currently receives about $1200/month disability and does not have medicaid which would provide longer term services in the home.  This CM explained about special assistance medicaid but she is not interested in long term facility placement or ALF.    This CM also explained that the insurance companies can  limit the amount of therapy that he receives and it will depend on his progress with rehab and rehab potential. Many insurance plans have a limited number of therapy visits allowed /year and he may be close to that amount already.   If a facility accepts him for therapy, it may not be daily therapy.   This CM will contact the home health PT - Kendra/[facility] # [phone] to discuss her goals for patient and possible benefit of facility placement for rehab.  This CM will also share this information with Dr Wynetta Emery.   [name] also noted that he may be experiencing depression which may be  contributing to his decline in functional performance.  Also instructed [name] to check with patient's insurance companies to inquire if there is a benefit for a Life Alert system.  Patient has appointment with Dr Wynetta Emery 06/07/2020  Call placed to [facility], [place] , message left with call back requested to this CM

## 2020-05-22 NOTE — Telephone Encounter (Signed)
Please follow up

## 2020-05-22 NOTE — Telephone Encounter (Signed)
[name] called stating that she received a call and is requesting to have a call back to discuss pt. Please advise.

## 2020-05-22 NOTE — Telephone Encounter (Signed)
Call placed to patient's wife, [name], # [phone] to discuss patient's care needs.  Message left with call back requested to this CM.

## 2020-05-24 DIAGNOSIS — E119 Type 2 diabetes mellitus without complications: Secondary | ICD-10-CM | POA: Diagnosis not present

## 2020-05-24 DIAGNOSIS — I10 Essential (primary) hypertension: Secondary | ICD-10-CM | POA: Diagnosis not present

## 2020-05-24 DIAGNOSIS — I69354 Hemiplegia and hemiparesis following cerebral infarction affecting left non-dominant side: Secondary | ICD-10-CM | POA: Diagnosis not present

## 2020-05-24 DIAGNOSIS — Z7982 Long term (current) use of aspirin: Secondary | ICD-10-CM | POA: Diagnosis not present

## 2020-05-24 DIAGNOSIS — Z79899 Other long term (current) drug therapy: Secondary | ICD-10-CM | POA: Diagnosis not present

## 2020-05-24 DIAGNOSIS — I69398 Other sequelae of cerebral infarction: Secondary | ICD-10-CM | POA: Diagnosis not present

## 2020-05-24 DIAGNOSIS — Z8616 Personal history of COVID-19: Secondary | ICD-10-CM | POA: Diagnosis not present

## 2020-05-24 DIAGNOSIS — R32 Unspecified urinary incontinence: Secondary | ICD-10-CM | POA: Diagnosis not present

## 2020-05-24 DIAGNOSIS — I69322 Dysarthria following cerebral infarction: Secondary | ICD-10-CM | POA: Diagnosis not present

## 2020-05-24 DIAGNOSIS — Z86011 Personal history of benign neoplasm of the brain: Secondary | ICD-10-CM | POA: Diagnosis not present

## 2020-05-24 DIAGNOSIS — Z96642 Presence of left artificial hip joint: Secondary | ICD-10-CM | POA: Diagnosis not present

## 2020-05-24 DIAGNOSIS — Z9181 History of falling: Secondary | ICD-10-CM | POA: Diagnosis not present

## 2020-05-24 DIAGNOSIS — I69328 Other speech and language deficits following cerebral infarction: Secondary | ICD-10-CM | POA: Diagnosis not present

## 2020-05-24 DIAGNOSIS — Z993 Dependence on wheelchair: Secondary | ICD-10-CM | POA: Diagnosis not present

## 2020-05-24 DIAGNOSIS — Z85048 Personal history of other malignant neoplasm of rectum, rectosigmoid junction, and anus: Secondary | ICD-10-CM | POA: Diagnosis not present

## 2020-05-26 ENCOUNTER — Other Ambulatory Visit: Payer: Self-pay | Admitting: Physical Medicine & Rehabilitation

## 2020-05-28 DIAGNOSIS — E119 Type 2 diabetes mellitus without complications: Secondary | ICD-10-CM | POA: Diagnosis not present

## 2020-05-28 DIAGNOSIS — R32 Unspecified urinary incontinence: Secondary | ICD-10-CM | POA: Diagnosis not present

## 2020-05-28 DIAGNOSIS — I69398 Other sequelae of cerebral infarction: Secondary | ICD-10-CM | POA: Diagnosis not present

## 2020-05-28 DIAGNOSIS — Z86011 Personal history of benign neoplasm of the brain: Secondary | ICD-10-CM | POA: Diagnosis not present

## 2020-05-28 DIAGNOSIS — I69328 Other speech and language deficits following cerebral infarction: Secondary | ICD-10-CM | POA: Diagnosis not present

## 2020-05-28 DIAGNOSIS — Z993 Dependence on wheelchair: Secondary | ICD-10-CM | POA: Diagnosis not present

## 2020-05-28 DIAGNOSIS — I69354 Hemiplegia and hemiparesis following cerebral infarction affecting left non-dominant side: Secondary | ICD-10-CM | POA: Diagnosis not present

## 2020-05-28 DIAGNOSIS — Z8616 Personal history of COVID-19: Secondary | ICD-10-CM | POA: Diagnosis not present

## 2020-05-28 DIAGNOSIS — Z9181 History of falling: Secondary | ICD-10-CM | POA: Diagnosis not present

## 2020-05-28 DIAGNOSIS — Z85048 Personal history of other malignant neoplasm of rectum, rectosigmoid junction, and anus: Secondary | ICD-10-CM | POA: Diagnosis not present

## 2020-05-28 DIAGNOSIS — I10 Essential (primary) hypertension: Secondary | ICD-10-CM | POA: Diagnosis not present

## 2020-05-28 DIAGNOSIS — Z96642 Presence of left artificial hip joint: Secondary | ICD-10-CM | POA: Diagnosis not present

## 2020-05-28 DIAGNOSIS — I69322 Dysarthria following cerebral infarction: Secondary | ICD-10-CM | POA: Diagnosis not present

## 2020-05-28 DIAGNOSIS — Z79899 Other long term (current) drug therapy: Secondary | ICD-10-CM | POA: Diagnosis not present

## 2020-05-28 DIAGNOSIS — Z7982 Long term (current) use of aspirin: Secondary | ICD-10-CM | POA: Diagnosis not present

## 2020-05-29 DIAGNOSIS — I69328 Other speech and language deficits following cerebral infarction: Secondary | ICD-10-CM | POA: Diagnosis not present

## 2020-05-29 DIAGNOSIS — Z86011 Personal history of benign neoplasm of the brain: Secondary | ICD-10-CM | POA: Diagnosis not present

## 2020-05-29 DIAGNOSIS — R32 Unspecified urinary incontinence: Secondary | ICD-10-CM | POA: Diagnosis not present

## 2020-05-29 DIAGNOSIS — Z79899 Other long term (current) drug therapy: Secondary | ICD-10-CM | POA: Diagnosis not present

## 2020-05-29 DIAGNOSIS — I69354 Hemiplegia and hemiparesis following cerebral infarction affecting left non-dominant side: Secondary | ICD-10-CM | POA: Diagnosis not present

## 2020-05-29 DIAGNOSIS — Z9181 History of falling: Secondary | ICD-10-CM | POA: Diagnosis not present

## 2020-05-29 DIAGNOSIS — Z993 Dependence on wheelchair: Secondary | ICD-10-CM | POA: Diagnosis not present

## 2020-05-29 DIAGNOSIS — Z96642 Presence of left artificial hip joint: Secondary | ICD-10-CM | POA: Diagnosis not present

## 2020-05-29 DIAGNOSIS — Z8616 Personal history of COVID-19: Secondary | ICD-10-CM | POA: Diagnosis not present

## 2020-05-29 DIAGNOSIS — E119 Type 2 diabetes mellitus without complications: Secondary | ICD-10-CM | POA: Diagnosis not present

## 2020-05-29 DIAGNOSIS — I10 Essential (primary) hypertension: Secondary | ICD-10-CM | POA: Diagnosis not present

## 2020-05-29 DIAGNOSIS — I69398 Other sequelae of cerebral infarction: Secondary | ICD-10-CM | POA: Diagnosis not present

## 2020-05-29 DIAGNOSIS — I69322 Dysarthria following cerebral infarction: Secondary | ICD-10-CM | POA: Diagnosis not present

## 2020-05-29 DIAGNOSIS — Z85048 Personal history of other malignant neoplasm of rectum, rectosigmoid junction, and anus: Secondary | ICD-10-CM | POA: Diagnosis not present

## 2020-05-29 DIAGNOSIS — Z7982 Long term (current) use of aspirin: Secondary | ICD-10-CM | POA: Diagnosis not present

## 2020-05-30 DIAGNOSIS — I69328 Other speech and language deficits following cerebral infarction: Secondary | ICD-10-CM | POA: Diagnosis not present

## 2020-05-30 DIAGNOSIS — E119 Type 2 diabetes mellitus without complications: Secondary | ICD-10-CM | POA: Diagnosis not present

## 2020-05-30 DIAGNOSIS — I10 Essential (primary) hypertension: Secondary | ICD-10-CM | POA: Diagnosis not present

## 2020-05-30 DIAGNOSIS — Z96642 Presence of left artificial hip joint: Secondary | ICD-10-CM | POA: Diagnosis not present

## 2020-05-30 DIAGNOSIS — I69398 Other sequelae of cerebral infarction: Secondary | ICD-10-CM | POA: Diagnosis not present

## 2020-05-30 DIAGNOSIS — Z79899 Other long term (current) drug therapy: Secondary | ICD-10-CM | POA: Diagnosis not present

## 2020-05-30 DIAGNOSIS — Z9181 History of falling: Secondary | ICD-10-CM | POA: Diagnosis not present

## 2020-05-30 DIAGNOSIS — Z86011 Personal history of benign neoplasm of the brain: Secondary | ICD-10-CM | POA: Diagnosis not present

## 2020-05-30 DIAGNOSIS — Z8616 Personal history of COVID-19: Secondary | ICD-10-CM | POA: Diagnosis not present

## 2020-05-30 DIAGNOSIS — I69354 Hemiplegia and hemiparesis following cerebral infarction affecting left non-dominant side: Secondary | ICD-10-CM | POA: Diagnosis not present

## 2020-05-30 DIAGNOSIS — Z7982 Long term (current) use of aspirin: Secondary | ICD-10-CM | POA: Diagnosis not present

## 2020-05-30 DIAGNOSIS — Z993 Dependence on wheelchair: Secondary | ICD-10-CM | POA: Diagnosis not present

## 2020-05-30 DIAGNOSIS — Z85048 Personal history of other malignant neoplasm of rectum, rectosigmoid junction, and anus: Secondary | ICD-10-CM | POA: Diagnosis not present

## 2020-05-30 DIAGNOSIS — I69322 Dysarthria following cerebral infarction: Secondary | ICD-10-CM | POA: Diagnosis not present

## 2020-05-30 DIAGNOSIS — R32 Unspecified urinary incontinence: Secondary | ICD-10-CM | POA: Diagnosis not present

## 2020-05-31 ENCOUNTER — Telehealth: Payer: Self-pay

## 2020-05-31 NOTE — Telephone Encounter (Signed)
Call placed to patient's wife, [name] to check on his progress with PT.  She had been requesting in patient rehab for him; but the home health PT did not feel he was ready for inpatient rehab yet until he is able to increase his weight bearing status. [name], PT was going to discuss this with his wife,   Message left with call back requested to this CM.

## 2020-06-04 DIAGNOSIS — I69328 Other speech and language deficits following cerebral infarction: Secondary | ICD-10-CM | POA: Diagnosis not present

## 2020-06-04 DIAGNOSIS — R32 Unspecified urinary incontinence: Secondary | ICD-10-CM | POA: Diagnosis not present

## 2020-06-04 DIAGNOSIS — Z9181 History of falling: Secondary | ICD-10-CM | POA: Diagnosis not present

## 2020-06-04 DIAGNOSIS — Z79899 Other long term (current) drug therapy: Secondary | ICD-10-CM | POA: Diagnosis not present

## 2020-06-04 DIAGNOSIS — I10 Essential (primary) hypertension: Secondary | ICD-10-CM | POA: Diagnosis not present

## 2020-06-04 DIAGNOSIS — I69354 Hemiplegia and hemiparesis following cerebral infarction affecting left non-dominant side: Secondary | ICD-10-CM | POA: Diagnosis not present

## 2020-06-04 DIAGNOSIS — Z96642 Presence of left artificial hip joint: Secondary | ICD-10-CM | POA: Diagnosis not present

## 2020-06-04 DIAGNOSIS — Z7982 Long term (current) use of aspirin: Secondary | ICD-10-CM | POA: Diagnosis not present

## 2020-06-04 DIAGNOSIS — I69322 Dysarthria following cerebral infarction: Secondary | ICD-10-CM | POA: Diagnosis not present

## 2020-06-04 DIAGNOSIS — I69398 Other sequelae of cerebral infarction: Secondary | ICD-10-CM | POA: Diagnosis not present

## 2020-06-04 DIAGNOSIS — Z86011 Personal history of benign neoplasm of the brain: Secondary | ICD-10-CM | POA: Diagnosis not present

## 2020-06-04 DIAGNOSIS — Z85048 Personal history of other malignant neoplasm of rectum, rectosigmoid junction, and anus: Secondary | ICD-10-CM | POA: Diagnosis not present

## 2020-06-04 DIAGNOSIS — Z993 Dependence on wheelchair: Secondary | ICD-10-CM | POA: Diagnosis not present

## 2020-06-04 DIAGNOSIS — Z8616 Personal history of COVID-19: Secondary | ICD-10-CM | POA: Diagnosis not present

## 2020-06-04 DIAGNOSIS — E119 Type 2 diabetes mellitus without complications: Secondary | ICD-10-CM | POA: Diagnosis not present

## 2020-06-07 ENCOUNTER — Other Ambulatory Visit: Payer: Self-pay

## 2020-06-07 ENCOUNTER — Ambulatory Visit: Payer: BC Managed Care – PPO | Attending: Internal Medicine | Admitting: Internal Medicine

## 2020-06-07 ENCOUNTER — Encounter: Payer: Self-pay | Admitting: Internal Medicine

## 2020-06-07 VITALS — BP 123/76 | HR 72 | Resp 16

## 2020-06-07 DIAGNOSIS — S92503A Displaced unspecified fracture of unspecified lesser toe(s), initial encounter for closed fracture: Secondary | ICD-10-CM

## 2020-06-07 DIAGNOSIS — C20 Malignant neoplasm of rectum: Secondary | ICD-10-CM

## 2020-06-07 DIAGNOSIS — I152 Hypertension secondary to endocrine disorders: Secondary | ICD-10-CM

## 2020-06-07 DIAGNOSIS — D509 Iron deficiency anemia, unspecified: Secondary | ICD-10-CM | POA: Diagnosis not present

## 2020-06-07 DIAGNOSIS — G8114 Spastic hemiplegia affecting left nondominant side: Secondary | ICD-10-CM

## 2020-06-07 DIAGNOSIS — L602 Onychogryphosis: Secondary | ICD-10-CM

## 2020-06-07 DIAGNOSIS — E1159 Type 2 diabetes mellitus with other circulatory complications: Secondary | ICD-10-CM

## 2020-06-07 LAB — GLUCOSE, POCT (MANUAL RESULT ENTRY): POC Glucose: 147 mg/dl — AB (ref 70–99)

## 2020-06-07 MED ORDER — CARVEDILOL 25 MG PO TABS: 25.0000 mg | ORAL_TABLET | Freq: Two times a day (BID) | ORAL | 1 refills | Status: DC

## 2020-06-07 MED ORDER — OLMESARTAN MEDOXOMIL 20 MG PO TABS: 20.0000 mg | ORAL_TABLET | Freq: Every day | ORAL | 1 refills | Status: DC

## 2020-06-07 NOTE — Patient Instructions (Signed)
You have some swelling in the lower legs.  Please keep your legs elevated when you are sitting down.  Please soak your feet in warm water at least 2-3 times a week to help get rid of the dead skin buildup.  I have referred you to a podiatrist for diabetic foot care.  Please let me know when you are ready for [place] to submit a referral to the gastroenterologist for you to have your colonoscopy done.

## 2020-06-07 NOTE — Progress Notes (Addendum)
Patient ID: [name], male    DOB: 04/24/1965  MRN: [id number]  CC: Diabetes and Hypertension   Subjective: [name] is a 55 y.o. male who presents for chronic ds management.  Pt is alone today.  He tells me that his wife dropped him off but was not able to stay as she had to go to work. His concerns today include:  [name], DM, rectal CA, chronic Ca+ brain mass, sz, brain stem CVA 11/2018with residualLT spastic hemiparesisand dysarthria  [name] on the 20th of last month for fall.  He was transferring from the toilet and lost his footing.  He sustained fracture to the fourth toe on the left foot.  Placed in postop shoe.   Saw [facility] day after. Told it will healed on its on and to continue wearing the postop shoe given to him in the ER.  Has f/u appt in few wks Currently doing Home PT 2 x a wk through [facility]  Wife wanted him to do inpatient rehab but the physical therapist from [facility] did not feel he was ready for inpatient rehab yet until he is able to increase his weightbearing status.  He is finding the home physical therapy very helpful.  He has not had any further falls.  DM: Results for orders placed or performed in visit on 06/07/20  POCT glucose (manual entry)  Result Value Ref Range   POC Glucose 147 (A) 70 - 99 mg/dl    Reports having had eye exam done at [facility] in March of this. No retinopathy.  Has new bifocals. Checks BS once a wk.  Reports readings good but can not tell me range. Doing good with eating habits.  Avoids sugary snacks and drinks only water and tea.  Uses honey in his tea. No numbness in hands/feet. Endorse frequent urination.  Has a bedside commode.   HTN: compliant with meds and salt restriction.  He is supposed to be on amlodipine, Benicar, carvedilol.  He does not have his medications with him today.  Denies any chest pains or shortness of breath.  He does have some  lower extremity edema.  Rectal CA: still not ready to do f/u with GI for colonoscopy No blood in stools.  Moving bowels okay  Hx of CVA:  Taking ASA and Lipitor  Patient Active Problem List   Diagnosis Date Noted  . Edema of both legs 09/29/2019  . Meningioma ([place]) 04/13/2019  . Hemiparesis and speech and language deficit as late effects of stroke ([place]) 03/31/2018  . Gait abnormality 03/31/2018  . Spastic hemiplegia affecting left nondominant side ([place]) 12/18/2017  . Subacromial impingement of left shoulder 06/09/2017  . Spastic neurogenic bladder 06/09/2017  . Dysarthria as late effect of stroke 04/01/2017  . Neurogenic bladder due to old stroke 12/25/2016  . Right pontine cerebrovascular accident ([place]) 12/18/2016  . Left hemiparesis ([place])   . Diastolic dysfunction   . Dysphagia, post-stroke   . Acute ischemic stroke ([place]) 12/12/2016  . Seizures ([place]) 07/22/2014  . Hypertension 07/22/2014  . Type 2 diabetes mellitus with vascular disease ([place]) 07/22/2014  . Brain mass   . Malignant neoplasm of rectum ([place]) 09/02/2007     Current Outpatient Medications on File Prior to Visit  Medication Sig Dispense Refill  . amLODipine (NORVASC) 10 MG tablet TAKE 1 TABLET BY MOUTH EVERY DAY 90 tablet 1  . aspirin 325 MG tablet Take  1 tablet (325 mg total) by mouth daily. 30 tablet 0  . atorvastatin (LIPITOR) 80 MG tablet Take 1 tablet (80 mg total) by mouth daily. 90 tablet 1  . indomethacin (INDOCIN) 50 MG capsule TAKE 1 CAPSULE (50 MG TOTAL) BY MOUTH 2 (TWO) TIMES DAILY WITH A MEAL. 60 capsule 2  . Melatonin 3 MG TABS Take 3 tablets (9 mg total) by mouth at bedtime. 30 tablet 0  . oxybutynin (DITROPAN) 5 MG tablet TAKE 1 TABLET BY MOUTH TWICE A DAY 180 tablet 0   No current facility-administered medications on file prior to visit.    No Known Allergies  Social History   Socioeconomic History  . Marital status: Married    Spouse name: [name]  . Number of children: 1  . Years of  education: 24  . Highest education level: Not on file  Occupational History  . Occupation: disabled  Tobacco Use  . Smoking status: Never Smoker  . Smokeless tobacco: Never Used  Vaping Use  . Vaping Use: Never used  Substance and Sexual Activity  . Alcohol use: Not Currently  . Drug use: No  . Sexual activity: Not Currently  Other Topics Concern  . Not on file  Social History Narrative   Patient drinks caffeine a couple times a week.   Patient is right handed.   Lives with wife [name]) and daughter   Social Determinants of Health   Financial Resource Strain: Not on file  Food Insecurity: Not on file  Transportation Needs: Not on file  Physical Activity: Not on file  Stress: Not on file  Social Connections: Not on file  Intimate Partner Violence: Not on file    Family History  Problem Relation Age of Onset  . Hypertension Mother   . Heart disease Mother   . Hyperlipidemia Mother   . Heart failure Mother   . Hypertension Sister   . Hypertension Brother   . Stroke Father   . Hypertension Father   . Colon cancer Maternal Grandmother   . Diabetes Maternal Grandmother   . Esophageal cancer Neg Hx     Past Surgical History:  Procedure Laterality Date  . APPENDECTOMY    . JOINT REPLACEMENT Left 2004   Left hip  . TOTAL HIP ARTHROPLASTY      ROS: Review of Systems Negative except as stated above  PHYSICAL EXAM: BP 123/76   Pulse 72   Resp 16   SpO2 99%   Physical Exam  General appearance - alert, well appearing, patient in wheelchair and in no distress Mental status -patient answers questions appropriately.  He is somewhat forgetful. Neck - supple, no significant adenopathy Chest - clear to auscultation, no wheezes, rales or rhonchi, symmetric air entry Heart - normal rate, regular rhythm, normal S1, S2, no murmurs, rubs, clicks or gallops Extremities -trace to 1+ bilateral lower extremity edema especially above the sock line.  Positive pedal edema.   He was wearing.  Boot on the left foot which was removed.  No tenderness on palpation of the left fourth toe. Neuro: He has left hemiparesis Diabetic Foot Exam - Simple   Simple Foot Form Visual Inspection No deformities, no ulcerations, no other skin breakdown bilaterally: Yes Sensation Testing Intact to touch and monofilament testing bilaterally: Yes Pulse Check Posterior Tibialis and Dorsalis pulse intact bilaterally: Yes Comments Poor feet hygiene.  A lot of dead flaking skin especially over and b/w toes.  Flat footed.  No ulcers  CMP Latest Ref Rng & Units 09/29/2019 04/21/2019 11/11/2018  Glucose 70 - 99 mg/dL - 144(H) 140(H)  BUN 6 - 20 mg/dL - 14 11  Creatinine 0.61 - 1.24 mg/dL - 1.36(H) 1.12  Sodium 135 - 145 mmol/L - 143 142  Potassium 3.5 - 5.1 mmol/L - 3.9 4.1  Chloride 98 - 111 mmol/L - 108 105  CO2 22 - 32 mmol/L - 24 23  Calcium 8.9 - 10.3 mg/dL - 9.1 9.4  Total Protein 6.0 - 8.5 g/dL 7.6 - 6.9  Total Bilirubin 0.0 - 1.2 mg/dL 0.4 - 0.4  Alkaline Phos 44 - 121 IU/L 68 - 99  AST 0 - 40 IU/L 15 - 10  ALT 0 - 44 IU/L 7 - 18   Lipid Panel     Component Value Date/Time   CHOL 191 09/29/2019 1459   TRIG 166 (H) 09/29/2019 1459   HDL 74 09/29/2019 1459   CHOLHDL 2.6 09/29/2019 1459   CHOLHDL 3.9 12/13/2016 0421   VLDL 15 12/13/2016 0421   LDLCALC 89 09/29/2019 1459    CBC    Component Value Date/Time   WBC 6.0 09/29/2019 1459   WBC 4.4 04/21/2019 1049   RBC 5.09 09/29/2019 1459   RBC 4.50 04/21/2019 1049   HGB 15.0 09/29/2019 1459   HGB 14.2 08/02/2008 1026   HCT 47.0 09/29/2019 1459   HCT 41.6 08/02/2008 1026   PLT 252 09/29/2019 1459   MCV 92 09/29/2019 1459   MCV 78.4 (L) 08/02/2008 1026   MCH 29.5 09/29/2019 1459   MCH 25.8 (L) 04/21/2019 1049   MCHC 31.9 09/29/2019 1459   MCHC 32.0 04/21/2019 1049   RDW 14.5 09/29/2019 1459   RDW 16.3 (H) 08/02/2008 1026   LYMPHSABS 1.3 11/11/2018 1400   LYMPHSABS 0.6 (L) 08/02/2008 1026   MONOABS 0.3  01/01/2017 0624   MONOABS 0.2 08/02/2008 1026   EOSABS 0.1 11/11/2018 1400   BASOSABS 0.0 11/11/2018 1400   BASOSABS 0.0 08/02/2008 1026    ASSESSMENT AND PLAN: 1. Controlled type 2 diabetes mellitus with other circulatory complication, without long-term current use of insulin (HCC) Last A1c was good.  We will check A1c today to make sure he is still diet controlled.  Commended him on his eating habits.  Further dietary counseling given. I will update his health maintenance indicating that he has had his eye exam done 2 months ago. -Discussed the need for better foot hygiene.  Recommend soaking feet in warm water 3 to 4 days a week to help get rid of some of the dead skin on the feet and between the toes.  Referral submitted to podiatry for clipping of toenails that are overgrown - POCT glucose (manual entry) - Hemoglobin A1c - Microalbumin / creatinine urine ratio - CBC - Comprehensive metabolic panel - Lipid panel - Ambulatory referral to Podiatry  2. Hypertension associated with diabetes ([place]) At goal.  Continue amlodipine, Benicar and carvedilol.  3. Closed fracture of phalanx of fourth toe Patient to keep follow-up appointment with orthopedics.  4. Malignant neoplasm of rectum (HCC) Strongly recommend that he do follow-up with gastroenterology for colonoscopy but patient still wants to delay on this for now.  5. Spastic hemiplegia affecting left nondominant side, unspecified etiology ([place]) Continue aspirin and atorvastatin Doing home physical therapy to help with safety to help prevent more falls  6. Overgrown nail See #1 above  Addendum 06/08/2020: Patient with microcytic anemia on CBC.  We will have the lab add iron  studies.   Patient was given the opportunity to ask questions.  Patient verbalized understanding of the plan and was able to repeat key elements of the plan.   Orders Placed This Encounter  Procedures  . Hemoglobin A1c  . Microalbumin / creatinine urine  ratio  . CBC  . Comprehensive metabolic panel  . Lipid panel  . Ambulatory referral to Podiatry  . POCT glucose (manual entry)     Requested Prescriptions   Signed Prescriptions Disp Refills  . carvedilol (COREG) 25 MG tablet 180 tablet 1    Sig: Take 1 tablet (25 mg total) by mouth 2 (two) times daily with a meal.  . olmesartan (BENICAR) 20 MG tablet 90 tablet 1    Sig: Take 1 tablet (20 mg total) by mouth daily.    Return in about 4 months (around 10/08/2020) for Give appt with Lurena Joiner in 6 wks for [facility] Visit.  Karle Plumber, MD, FACP

## 2020-06-08 ENCOUNTER — Telehealth: Payer: Self-pay | Admitting: Internal Medicine

## 2020-06-08 ENCOUNTER — Telehealth: Payer: Self-pay

## 2020-06-08 DIAGNOSIS — Z96642 Presence of left artificial hip joint: Secondary | ICD-10-CM | POA: Diagnosis not present

## 2020-06-08 DIAGNOSIS — Z86011 Personal history of benign neoplasm of the brain: Secondary | ICD-10-CM | POA: Diagnosis not present

## 2020-06-08 DIAGNOSIS — Z79899 Other long term (current) drug therapy: Secondary | ICD-10-CM | POA: Diagnosis not present

## 2020-06-08 DIAGNOSIS — Z993 Dependence on wheelchair: Secondary | ICD-10-CM | POA: Diagnosis not present

## 2020-06-08 DIAGNOSIS — C20 Malignant neoplasm of rectum: Secondary | ICD-10-CM

## 2020-06-08 DIAGNOSIS — Z85048 Personal history of other malignant neoplasm of rectum, rectosigmoid junction, and anus: Secondary | ICD-10-CM | POA: Diagnosis not present

## 2020-06-08 DIAGNOSIS — I69328 Other speech and language deficits following cerebral infarction: Secondary | ICD-10-CM | POA: Diagnosis not present

## 2020-06-08 DIAGNOSIS — D509 Iron deficiency anemia, unspecified: Secondary | ICD-10-CM

## 2020-06-08 DIAGNOSIS — R32 Unspecified urinary incontinence: Secondary | ICD-10-CM | POA: Diagnosis not present

## 2020-06-08 DIAGNOSIS — I69322 Dysarthria following cerebral infarction: Secondary | ICD-10-CM | POA: Diagnosis not present

## 2020-06-08 DIAGNOSIS — E119 Type 2 diabetes mellitus without complications: Secondary | ICD-10-CM | POA: Diagnosis not present

## 2020-06-08 DIAGNOSIS — Z9181 History of falling: Secondary | ICD-10-CM | POA: Diagnosis not present

## 2020-06-08 DIAGNOSIS — Z7982 Long term (current) use of aspirin: Secondary | ICD-10-CM | POA: Diagnosis not present

## 2020-06-08 DIAGNOSIS — Z8616 Personal history of COVID-19: Secondary | ICD-10-CM | POA: Diagnosis not present

## 2020-06-08 DIAGNOSIS — I69354 Hemiplegia and hemiparesis following cerebral infarction affecting left non-dominant side: Secondary | ICD-10-CM | POA: Diagnosis not present

## 2020-06-08 DIAGNOSIS — I10 Essential (primary) hypertension: Secondary | ICD-10-CM | POA: Diagnosis not present

## 2020-06-08 DIAGNOSIS — I69398 Other sequelae of cerebral infarction: Secondary | ICD-10-CM | POA: Diagnosis not present

## 2020-06-08 LAB — CBC
Hematocrit: 36.6 % — ABNORMAL LOW (ref 37.5–51.0)
Hemoglobin: 11.8 g/dL — ABNORMAL LOW (ref 13.0–17.7)
MCH: 25.2 pg — ABNORMAL LOW (ref 26.6–33.0)
MCHC: 32.2 g/dL (ref 31.5–35.7)
MCV: 78 fL — ABNORMAL LOW (ref 79–97)
Platelets: 180 10*3/uL (ref 150–450)
RBC: 4.68 x10E6/uL (ref 4.14–5.80)
RDW: 15.6 % — ABNORMAL HIGH (ref 11.6–15.4)
WBC: 4.3 10*3/uL (ref 3.4–10.8)

## 2020-06-08 LAB — COMPREHENSIVE METABOLIC PANEL
ALT: 20 IU/L (ref 0–44)
AST: 15 IU/L (ref 0–40)
Albumin/Globulin Ratio: 1.5 (ref 1.2–2.2)
Albumin: 4.4 g/dL (ref 3.8–4.9)
Alkaline Phosphatase: 107 IU/L (ref 44–121)
BUN/Creatinine Ratio: 14 (ref 9–20)
BUN: 17 mg/dL (ref 6–24)
Bilirubin Total: 0.4 mg/dL (ref 0.0–1.2)
CO2: 23 mmol/L (ref 20–29)
Calcium: 9.5 mg/dL (ref 8.7–10.2)
Chloride: 108 mmol/L — ABNORMAL HIGH (ref 96–106)
Creatinine, Ser: 1.21 mg/dL (ref 0.76–1.27)
Globulin, Total: 3 g/dL (ref 1.5–4.5)
Glucose: 136 mg/dL — ABNORMAL HIGH (ref 65–99)
Potassium: 4.7 mmol/L (ref 3.5–5.2)
Sodium: 145 mmol/L — ABNORMAL HIGH (ref 134–144)
Total Protein: 7.4 g/dL (ref 6.0–8.5)
eGFR: 71 mL/min/{1.73_m2} (ref >59)

## 2020-06-08 LAB — LIPID PANEL
Chol/HDL Ratio: 3.2 ratio (ref 0.0–5.0)
Cholesterol, Total: 141 mg/dL (ref 100–199)
HDL: 44 mg/dL (ref >39)
LDL Chol Calc (NIH): 82 mg/dL (ref 0–99)
Triglycerides: 75 mg/dL (ref 0–149)
VLDL Cholesterol Cal: 15 mg/dL (ref 5–40)

## 2020-06-08 LAB — HEMOGLOBIN A1C
Est. average glucose Bld gHb Est-mCnc: 160 mg/dL
Hgb A1c MFr Bld: 7.2 % — ABNORMAL HIGH (ref 4.8–5.6)

## 2020-06-08 LAB — MICROALBUMIN / CREATININE URINE RATIO
Creatinine, Urine: 53.5 mg/dL
Microalb/Creat Ratio: 60 mg/g creat — ABNORMAL HIGH (ref 0–29)
Microalbumin, Urine: 32.3 ug/mL

## 2020-06-08 MED ORDER — METFORMIN HCL 500 MG PO TABS: 500.0000 mg | ORAL_TABLET | Freq: Every day | ORAL | 1 refills | Status: DC

## 2020-06-08 NOTE — Telephone Encounter (Signed)
Contacted pt to go over lab results pt is aware and doesn't have any questions or concerns 

## 2020-06-08 NOTE — Telephone Encounter (Signed)
Phone call placed to patient this morning to discuss lab results.  Patient informed that his A1c was 7.2 meaning that his blood sugars are not as controlled as we would like them to be.  This number has crept up from when it was last checked.  I recommend that we start a low-dose of a medication called metformin.  Healthy eating habits also advised.  Informed patient that the metformin can sometimes cause bloating and diarrhea.  If he experiences any of this he should give me a call. Blood test also shows that his hemoglobin has decreased from 15 to 11.8 from September of last year.  The anemia is microcytic based on MCV.  I am concerned that this is iron deficiency anemia which may suggest that he is losing small amounts of blood from his bowel.  Given his history of rectal cancer, I strongly suggest that he reconsider following up with a gastroenterologist to have colonoscopy done.  I will have the lab add iron studies onto the blood that has already been drawn.  Patient agreeable for me to submit the referral to the gastroenterologist Dr. Ardis Hughs.  He also requested that I speak with his wife.  After I hung up from him, I placed a phone call to his wife and got her voicemail.  I left a message informing her of who I am and that I was calling to review her spouse's lab results with her.  Request that she give a call back to the office.  Results for orders placed or performed in visit on 06/07/20  Hemoglobin A1c  Result Value Ref Range   Hgb A1c MFr Bld 7.2 (H) 4.8 - 5.6 %   Est. average glucose Bld gHb Est-mCnc 160 mg/dL  CBC  Result Value Ref Range   WBC 4.3 3.4 - 10.8 x10E3/uL   RBC 4.68 4.14 - 5.80 x10E6/uL   Hemoglobin 11.8 (L) 13.0 - 17.7 g/dL   Hematocrit 36.6 (L) 37.5 - 51.0 %   MCV 78 (L) 79 - 97 fL   MCH 25.2 (L) 26.6 - 33.0 pg   MCHC 32.2 31.5 - 35.7 g/dL   RDW 15.6 (H) 11.6 - 15.4 %   Platelets 180 150 - 450 x10E3/uL  Comprehensive metabolic panel  Result Value Ref Range   Glucose  136 (H) 65 - 99 mg/dL   BUN 17 6 - 24 mg/dL   Creatinine, Ser 1.21 0.76 - 1.27 mg/dL   eGFR 71 >59 mL/min/1.73   BUN/Creatinine Ratio 14 9 - 20   Sodium 145 (H) 134 - 144 mmol/L   Potassium 4.7 3.5 - 5.2 mmol/L   Chloride 108 (H) 96 - 106 mmol/L   CO2 23 20 - 29 mmol/L   Calcium 9.5 8.7 - 10.2 mg/dL   Total Protein 7.4 6.0 - 8.5 g/dL   Albumin 4.4 3.8 - 4.9 g/dL   Globulin, Total 3.0 1.5 - 4.5 g/dL   Albumin/Globulin Ratio 1.5 1.2 - 2.2   Bilirubin Total 0.4 0.0 - 1.2 mg/dL   Alkaline Phosphatase 107 44 - 121 IU/L   AST 15 0 - 40 IU/L   ALT 20 0 - 44 IU/L  Lipid panel  Result Value Ref Range   Cholesterol, Total 141 100 - 199 mg/dL   Triglycerides 75 0 - 149 mg/dL   HDL 44 >39 mg/dL   VLDL Cholesterol Cal 15 5 - 40 mg/dL   LDL Chol Calc (NIH) 82 0 - 99 mg/dL   Chol/HDL Ratio 3.2  0.0 - 5.0 ratio  POCT glucose (manual entry)  Result Value Ref Range   POC Glucose 147 (A) 70 - 99 mg/dl

## 2020-06-08 NOTE — Addendum Note (Signed)
Addended by: Karle Plumber B on: 06/08/2020 09:21 AM   Modules accepted: Orders

## 2020-06-14 DIAGNOSIS — E119 Type 2 diabetes mellitus without complications: Secondary | ICD-10-CM | POA: Diagnosis not present

## 2020-06-14 DIAGNOSIS — Z9181 History of falling: Secondary | ICD-10-CM | POA: Diagnosis not present

## 2020-06-14 DIAGNOSIS — Z79899 Other long term (current) drug therapy: Secondary | ICD-10-CM | POA: Diagnosis not present

## 2020-06-14 DIAGNOSIS — I69322 Dysarthria following cerebral infarction: Secondary | ICD-10-CM | POA: Diagnosis not present

## 2020-06-14 DIAGNOSIS — I10 Essential (primary) hypertension: Secondary | ICD-10-CM | POA: Diagnosis not present

## 2020-06-14 DIAGNOSIS — Z8616 Personal history of COVID-19: Secondary | ICD-10-CM | POA: Diagnosis not present

## 2020-06-14 DIAGNOSIS — I69328 Other speech and language deficits following cerebral infarction: Secondary | ICD-10-CM | POA: Diagnosis not present

## 2020-06-14 DIAGNOSIS — I69354 Hemiplegia and hemiparesis following cerebral infarction affecting left non-dominant side: Secondary | ICD-10-CM | POA: Diagnosis not present

## 2020-06-14 DIAGNOSIS — Z993 Dependence on wheelchair: Secondary | ICD-10-CM | POA: Diagnosis not present

## 2020-06-14 DIAGNOSIS — Z85048 Personal history of other malignant neoplasm of rectum, rectosigmoid junction, and anus: Secondary | ICD-10-CM | POA: Diagnosis not present

## 2020-06-14 DIAGNOSIS — I69398 Other sequelae of cerebral infarction: Secondary | ICD-10-CM | POA: Diagnosis not present

## 2020-06-14 DIAGNOSIS — Z7982 Long term (current) use of aspirin: Secondary | ICD-10-CM | POA: Diagnosis not present

## 2020-06-14 DIAGNOSIS — R32 Unspecified urinary incontinence: Secondary | ICD-10-CM | POA: Diagnosis not present

## 2020-06-14 DIAGNOSIS — Z86011 Personal history of benign neoplasm of the brain: Secondary | ICD-10-CM | POA: Diagnosis not present

## 2020-06-14 DIAGNOSIS — Z96642 Presence of left artificial hip joint: Secondary | ICD-10-CM | POA: Diagnosis not present

## 2020-06-18 DIAGNOSIS — Z79899 Other long term (current) drug therapy: Secondary | ICD-10-CM | POA: Diagnosis not present

## 2020-06-18 DIAGNOSIS — Z9181 History of falling: Secondary | ICD-10-CM | POA: Diagnosis not present

## 2020-06-18 DIAGNOSIS — I69398 Other sequelae of cerebral infarction: Secondary | ICD-10-CM | POA: Diagnosis not present

## 2020-06-18 DIAGNOSIS — Z86011 Personal history of benign neoplasm of the brain: Secondary | ICD-10-CM | POA: Diagnosis not present

## 2020-06-18 DIAGNOSIS — Z85048 Personal history of other malignant neoplasm of rectum, rectosigmoid junction, and anus: Secondary | ICD-10-CM | POA: Diagnosis not present

## 2020-06-18 DIAGNOSIS — I69322 Dysarthria following cerebral infarction: Secondary | ICD-10-CM | POA: Diagnosis not present

## 2020-06-18 DIAGNOSIS — I10 Essential (primary) hypertension: Secondary | ICD-10-CM | POA: Diagnosis not present

## 2020-06-18 DIAGNOSIS — Z8616 Personal history of COVID-19: Secondary | ICD-10-CM | POA: Diagnosis not present

## 2020-06-18 DIAGNOSIS — Z993 Dependence on wheelchair: Secondary | ICD-10-CM | POA: Diagnosis not present

## 2020-06-18 DIAGNOSIS — Z96642 Presence of left artificial hip joint: Secondary | ICD-10-CM | POA: Diagnosis not present

## 2020-06-18 DIAGNOSIS — I69328 Other speech and language deficits following cerebral infarction: Secondary | ICD-10-CM | POA: Diagnosis not present

## 2020-06-18 DIAGNOSIS — I69354 Hemiplegia and hemiparesis following cerebral infarction affecting left non-dominant side: Secondary | ICD-10-CM | POA: Diagnosis not present

## 2020-06-18 DIAGNOSIS — E119 Type 2 diabetes mellitus without complications: Secondary | ICD-10-CM | POA: Diagnosis not present

## 2020-06-18 DIAGNOSIS — Z7982 Long term (current) use of aspirin: Secondary | ICD-10-CM | POA: Diagnosis not present

## 2020-06-18 DIAGNOSIS — R32 Unspecified urinary incontinence: Secondary | ICD-10-CM | POA: Diagnosis not present

## 2020-06-20 ENCOUNTER — Other Ambulatory Visit: Payer: Self-pay | Admitting: Podiatry

## 2020-06-20 ENCOUNTER — Ambulatory Visit (INDEPENDENT_AMBULATORY_CARE_PROVIDER_SITE_OTHER): Payer: BC Managed Care – PPO | Admitting: Podiatry

## 2020-06-20 ENCOUNTER — Other Ambulatory Visit: Payer: Self-pay

## 2020-06-20 ENCOUNTER — Encounter: Payer: Self-pay | Admitting: Podiatry

## 2020-06-20 ENCOUNTER — Ambulatory Visit: Payer: BC Managed Care – PPO

## 2020-06-20 DIAGNOSIS — M79672 Pain in left foot: Secondary | ICD-10-CM

## 2020-06-20 DIAGNOSIS — B351 Tinea unguium: Secondary | ICD-10-CM

## 2020-06-20 NOTE — Progress Notes (Signed)
This patient returns to my office for at risk foot care.  This patient requires this care by a professional since this patient will be at risk due to having diabetes with angiopathy, edema and CVA.    This patient is unable to cut nails himself since the patient cannot reach his nails.These nails are painful walking and wearing shoes. He presents to the office in a wheelchair.  He also says he has roken bone in his left foot which was xrayed and treated with surgical shoe left foot.    This patient presents for at risk foot care today.  General Appearance  Alert, conversant and in no acute stress.  Vascular  Dorsalis pedis and posterior tibial  pulses are not palpable due to swelling.  bilaterally.  Capillary return is within normal limits  bilaterally. Temperature is within normal limits  bilaterally.  Neurologic  Senn-Weinstein monofilament wire test within normal limits  bilaterally. Muscle power within normal limits bilaterally.  Nails Thick disfigured discolored nails with subungual debris  from hallux to fifth toes bilaterally. No evidence of bacterial infection or drainage bilaterally.  Orthopedic  No limitations of motion  feet .  No crepitus or effusions noted.  No bony pathology or digital deformities noted. No palpable pain left foot nor increased swelling.  Skin  Dry scaling  skin with no porokeratosis noted bilaterally.  No signs of infections or ulcers noted.     Onychomycosis  Pain in right toes  Pain in left toes   Consent was obtained for treatment procedures.   Mechanical debridement of nails 1-5  bilaterally performed with a nail nipper.  Filed with dremel without incident. Reviewed his xrays since he has dramatic swelling left foot.  Patient had been diagnosed with closed metatarsal fracture fourth metatarsal left foot.  No palpable pain or increased temperature left foot.     Return office visit  3 months for nail care.                   Told patient to return for periodic  foot care and evaluation due to potential at risk complications.   Gardiner Barefoot DPM

## 2020-06-21 ENCOUNTER — Other Ambulatory Visit: Payer: Self-pay

## 2020-06-21 ENCOUNTER — Encounter (HOSPITAL_COMMUNITY): Payer: Self-pay

## 2020-06-21 ENCOUNTER — Ambulatory Visit (HOSPITAL_COMMUNITY)
Admission: EM | Admit: 2020-06-21 | Discharge: 2020-06-21 | Disposition: A | Discharge status: Home / Self Care | Payer: BC Managed Care – PPO | Attending: Family Medicine | Admitting: Family Medicine

## 2020-06-21 DIAGNOSIS — Z20822 Contact with and (suspected) exposure to covid-19: Secondary | ICD-10-CM | POA: Insufficient documentation

## 2020-06-21 DIAGNOSIS — J069 Acute upper respiratory infection, unspecified: Secondary | ICD-10-CM | POA: Diagnosis not present

## 2020-06-21 LAB — IRON AND TIBC
Iron Saturation: 20 % (ref 15–55)
Iron: 50 ug/dL (ref 38–169)
Total Iron Binding Capacity: 254 ug/dL (ref 250–450)
UIBC: 204 ug/dL (ref 111–343)

## 2020-06-21 LAB — FERRITIN: Ferritin: 117 ng/mL (ref 30–400)

## 2020-06-21 LAB — SARS CORONAVIRUS 2 (TAT 6-24 HRS): SARS Coronavirus 2: POSITIVE — AB

## 2020-06-21 LAB — SPECIMEN STATUS REPORT

## 2020-06-21 MED ORDER — BENZONATATE 100 MG PO CAPS: ORAL_CAPSULE | ORAL | 0 refills | Status: DC

## 2020-06-21 NOTE — ED Provider Notes (Signed)
[place]   [id number] 06/21/20 Arrival Time: 5400  ASSESSMENT & PLAN:  1. Viral URI with cough   2. Exposure to COVID-19 virus    COVID-19 testing sent. OTC symptom care as needed.  Meds ordered this encounter  Medications   benzonatate (TESSALON) 100 MG capsule    Sig: Take 1 capsule by mouth every 8 (eight) hours for cough.    Dispense:  21 capsule    Refill:  0     Follow-up Information     Ladell Pier, MD.   Specialty: Internal Medicine Why: As needed. Contact information: [place] [address] [phone]                 Reviewed expectations re: course of current medical issues. Questions answered. Outlined signs and symptoms indicating need for more acute intervention. Understanding verbalized. After Visit Summary given.   SUBJECTIVE: History from: patient. [name] is a 55 y.o. male who presents with worries regarding COVID-19. Known COVID-19 contact: family member. Recent travel: none. Reports: cough, body aches; sev days. Denies: fever and difficulty breathing. Normal PO intake without n/v/d.    OBJECTIVE:  Vitals:   06/21/20 1236  BP: 116/79  Pulse: 79  Resp: 17  Temp: 98.6 F (37 C)  TempSrc: Oral  SpO2: 95%    General appearance: alert; no distress Eyes: PERRLA; EOMI; conjunctiva normal HENT: [place]; AT; with nasal congestion Neck: supple  Lungs: speaks full sentences without difficulty; unlabored Extremities: no edema Skin: warm and dry Neurologic: normal gait Psychological: alert and cooperative; normal mood and affect  Labs:  Labs Reviewed  SARS CORONAVIRUS 2 (TAT 6-24 HRS)    No Known Allergies  Past Medical History:  Diagnosis Date   Colon cancer ([place]) 2009   rectal   Diabetes mellitus without complication (HCC)    Gait abnormality 03/31/2018   Hemiparesis and speech and language deficit as late effects of stroke ([place]) 03/31/2018   Hypertension    Seizure ([place])     Stroke ([place]) 2018   Social History   Socioeconomic History   Marital status: Married    Spouse name: [name]   Number of children: 1   Years of education: 16   Highest education level: Not on file  Occupational History   Occupation: disabled  Tobacco Use   Smoking status: Never   Smokeless tobacco: Never  Vaping Use   Vaping Use: Never used  Substance and Sexual Activity   Alcohol use: Not Currently   Drug use: No   Sexual activity: Not Currently  Other Topics Concern   Not on file  Social History Narrative   Patient drinks caffeine a couple times a week.   Patient is right handed.   Lives with wife [name]) and daughter   Social Determinants of Health   Financial Resource Strain: Not on file  Food Insecurity: Not on file  Transportation Needs: Not on file  Physical Activity: Not on file  Stress: Not on file  Social Connections: Not on file  Intimate Partner Violence: Not on file   Family History  Problem Relation Age of Onset   Hypertension Mother    Heart disease Mother    Hyperlipidemia Mother    Heart failure Mother    Hypertension Sister    Hypertension Brother    Stroke Father    Hypertension Father    Colon cancer Maternal Grandmother    Diabetes Maternal Grandmother    Esophageal cancer Neg  Hx    Past Surgical History:  Procedure Laterality Date   APPENDECTOMY     JOINT REPLACEMENT Left 2004   Left hip   TOTAL HIP ARTHROPLASTY       Vanessa Kick, MD 06/21/20 1338

## 2020-06-21 NOTE — ED Triage Notes (Signed)
Pt in with c/o cough, runny nose, nausea since Saturday  Pt has not taken any OTC medication for sx

## 2020-06-21 NOTE — Discharge Instructions (Addendum)
You have been tested for COVID-19 today. °If your test returns positive, you will receive a phone call from [facility] regarding your results. °Negative test results are not called. °Both positive and negative results area always visible on MyChart. °If you do not have a MyChart account, sign up instructions are provided in your discharge papers. °Please do not hesitate to contact us should you have questions or concerns. ° °

## 2020-06-29 DIAGNOSIS — I69398 Other sequelae of cerebral infarction: Secondary | ICD-10-CM | POA: Diagnosis not present

## 2020-06-29 DIAGNOSIS — Z993 Dependence on wheelchair: Secondary | ICD-10-CM | POA: Diagnosis not present

## 2020-06-29 DIAGNOSIS — Z7984 Long term (current) use of oral hypoglycemic drugs: Secondary | ICD-10-CM | POA: Diagnosis not present

## 2020-06-29 DIAGNOSIS — I69321 Dysphasia following cerebral infarction: Secondary | ICD-10-CM | POA: Diagnosis not present

## 2020-06-29 DIAGNOSIS — E119 Type 2 diabetes mellitus without complications: Secondary | ICD-10-CM | POA: Diagnosis not present

## 2020-06-29 DIAGNOSIS — Z7982 Long term (current) use of aspirin: Secondary | ICD-10-CM | POA: Diagnosis not present

## 2020-06-29 DIAGNOSIS — I69354 Hemiplegia and hemiparesis following cerebral infarction affecting left non-dominant side: Secondary | ICD-10-CM | POA: Diagnosis not present

## 2020-06-29 DIAGNOSIS — I69322 Dysarthria following cerebral infarction: Secondary | ICD-10-CM | POA: Diagnosis not present

## 2020-06-29 DIAGNOSIS — Z96642 Presence of left artificial hip joint: Secondary | ICD-10-CM | POA: Diagnosis not present

## 2020-06-29 DIAGNOSIS — Z791 Long term (current) use of non-steroidal anti-inflammatories (NSAID): Secondary | ICD-10-CM | POA: Diagnosis not present

## 2020-06-29 DIAGNOSIS — Z85048 Personal history of other malignant neoplasm of rectum, rectosigmoid junction, and anus: Secondary | ICD-10-CM | POA: Diagnosis not present

## 2020-06-29 DIAGNOSIS — Z8616 Personal history of COVID-19: Secondary | ICD-10-CM | POA: Diagnosis not present

## 2020-06-29 DIAGNOSIS — I10 Essential (primary) hypertension: Secondary | ICD-10-CM | POA: Diagnosis not present

## 2020-06-29 DIAGNOSIS — S92402D Displaced unspecified fracture of left great toe, subsequent encounter for fracture with routine healing: Secondary | ICD-10-CM | POA: Diagnosis not present

## 2020-06-29 DIAGNOSIS — Z86011 Personal history of benign neoplasm of the brain: Secondary | ICD-10-CM | POA: Diagnosis not present

## 2020-06-29 DIAGNOSIS — R569 Unspecified convulsions: Secondary | ICD-10-CM | POA: Diagnosis not present

## 2020-07-06 DIAGNOSIS — Z96642 Presence of left artificial hip joint: Secondary | ICD-10-CM | POA: Diagnosis not present

## 2020-07-06 DIAGNOSIS — R569 Unspecified convulsions: Secondary | ICD-10-CM | POA: Diagnosis not present

## 2020-07-06 DIAGNOSIS — Z86011 Personal history of benign neoplasm of the brain: Secondary | ICD-10-CM | POA: Diagnosis not present

## 2020-07-06 DIAGNOSIS — Z7984 Long term (current) use of oral hypoglycemic drugs: Secondary | ICD-10-CM | POA: Diagnosis not present

## 2020-07-06 DIAGNOSIS — Z993 Dependence on wheelchair: Secondary | ICD-10-CM | POA: Diagnosis not present

## 2020-07-06 DIAGNOSIS — I69322 Dysarthria following cerebral infarction: Secondary | ICD-10-CM | POA: Diagnosis not present

## 2020-07-06 DIAGNOSIS — S92402D Displaced unspecified fracture of left great toe, subsequent encounter for fracture with routine healing: Secondary | ICD-10-CM | POA: Diagnosis not present

## 2020-07-06 DIAGNOSIS — I69354 Hemiplegia and hemiparesis following cerebral infarction affecting left non-dominant side: Secondary | ICD-10-CM | POA: Diagnosis not present

## 2020-07-06 DIAGNOSIS — Z7982 Long term (current) use of aspirin: Secondary | ICD-10-CM | POA: Diagnosis not present

## 2020-07-06 DIAGNOSIS — I69398 Other sequelae of cerebral infarction: Secondary | ICD-10-CM | POA: Diagnosis not present

## 2020-07-06 DIAGNOSIS — Z8616 Personal history of COVID-19: Secondary | ICD-10-CM | POA: Diagnosis not present

## 2020-07-06 DIAGNOSIS — E119 Type 2 diabetes mellitus without complications: Secondary | ICD-10-CM | POA: Diagnosis not present

## 2020-07-06 DIAGNOSIS — Z85048 Personal history of other malignant neoplasm of rectum, rectosigmoid junction, and anus: Secondary | ICD-10-CM | POA: Diagnosis not present

## 2020-07-06 DIAGNOSIS — Z791 Long term (current) use of non-steroidal anti-inflammatories (NSAID): Secondary | ICD-10-CM | POA: Diagnosis not present

## 2020-07-06 DIAGNOSIS — I10 Essential (primary) hypertension: Secondary | ICD-10-CM | POA: Diagnosis not present

## 2020-07-06 DIAGNOSIS — I69321 Dysphasia following cerebral infarction: Secondary | ICD-10-CM | POA: Diagnosis not present

## 2020-07-11 DIAGNOSIS — Z791 Long term (current) use of non-steroidal anti-inflammatories (NSAID): Secondary | ICD-10-CM | POA: Diagnosis not present

## 2020-07-11 DIAGNOSIS — Z7984 Long term (current) use of oral hypoglycemic drugs: Secondary | ICD-10-CM | POA: Diagnosis not present

## 2020-07-11 DIAGNOSIS — Z86011 Personal history of benign neoplasm of the brain: Secondary | ICD-10-CM | POA: Diagnosis not present

## 2020-07-11 DIAGNOSIS — I69322 Dysarthria following cerebral infarction: Secondary | ICD-10-CM | POA: Diagnosis not present

## 2020-07-11 DIAGNOSIS — I69354 Hemiplegia and hemiparesis following cerebral infarction affecting left non-dominant side: Secondary | ICD-10-CM | POA: Diagnosis not present

## 2020-07-11 DIAGNOSIS — Z85048 Personal history of other malignant neoplasm of rectum, rectosigmoid junction, and anus: Secondary | ICD-10-CM | POA: Diagnosis not present

## 2020-07-11 DIAGNOSIS — Z96642 Presence of left artificial hip joint: Secondary | ICD-10-CM | POA: Diagnosis not present

## 2020-07-11 DIAGNOSIS — R569 Unspecified convulsions: Secondary | ICD-10-CM | POA: Diagnosis not present

## 2020-07-11 DIAGNOSIS — I69321 Dysphasia following cerebral infarction: Secondary | ICD-10-CM | POA: Diagnosis not present

## 2020-07-11 DIAGNOSIS — E119 Type 2 diabetes mellitus without complications: Secondary | ICD-10-CM | POA: Diagnosis not present

## 2020-07-11 DIAGNOSIS — S92402D Displaced unspecified fracture of left great toe, subsequent encounter for fracture with routine healing: Secondary | ICD-10-CM | POA: Diagnosis not present

## 2020-07-11 DIAGNOSIS — I69398 Other sequelae of cerebral infarction: Secondary | ICD-10-CM | POA: Diagnosis not present

## 2020-07-11 DIAGNOSIS — Z8616 Personal history of COVID-19: Secondary | ICD-10-CM | POA: Diagnosis not present

## 2020-07-11 DIAGNOSIS — I10 Essential (primary) hypertension: Secondary | ICD-10-CM | POA: Diagnosis not present

## 2020-07-11 DIAGNOSIS — Z7982 Long term (current) use of aspirin: Secondary | ICD-10-CM | POA: Diagnosis not present

## 2020-07-11 DIAGNOSIS — Z993 Dependence on wheelchair: Secondary | ICD-10-CM | POA: Diagnosis not present

## 2020-07-17 ENCOUNTER — Telehealth: Payer: Self-pay | Admitting: Neurology

## 2020-07-17 DIAGNOSIS — R569 Unspecified convulsions: Secondary | ICD-10-CM | POA: Diagnosis not present

## 2020-07-17 DIAGNOSIS — Z96642 Presence of left artificial hip joint: Secondary | ICD-10-CM | POA: Diagnosis not present

## 2020-07-17 DIAGNOSIS — I69398 Other sequelae of cerebral infarction: Secondary | ICD-10-CM | POA: Diagnosis not present

## 2020-07-17 DIAGNOSIS — S92402D Displaced unspecified fracture of left great toe, subsequent encounter for fracture with routine healing: Secondary | ICD-10-CM | POA: Diagnosis not present

## 2020-07-17 DIAGNOSIS — E119 Type 2 diabetes mellitus without complications: Secondary | ICD-10-CM | POA: Diagnosis not present

## 2020-07-17 DIAGNOSIS — Z86011 Personal history of benign neoplasm of the brain: Secondary | ICD-10-CM | POA: Diagnosis not present

## 2020-07-17 DIAGNOSIS — Z85048 Personal history of other malignant neoplasm of rectum, rectosigmoid junction, and anus: Secondary | ICD-10-CM | POA: Diagnosis not present

## 2020-07-17 DIAGNOSIS — Z993 Dependence on wheelchair: Secondary | ICD-10-CM | POA: Diagnosis not present

## 2020-07-17 DIAGNOSIS — Z8616 Personal history of COVID-19: Secondary | ICD-10-CM | POA: Diagnosis not present

## 2020-07-17 DIAGNOSIS — Z791 Long term (current) use of non-steroidal anti-inflammatories (NSAID): Secondary | ICD-10-CM | POA: Diagnosis not present

## 2020-07-17 DIAGNOSIS — I69354 Hemiplegia and hemiparesis following cerebral infarction affecting left non-dominant side: Secondary | ICD-10-CM | POA: Diagnosis not present

## 2020-07-17 DIAGNOSIS — I69322 Dysarthria following cerebral infarction: Secondary | ICD-10-CM | POA: Diagnosis not present

## 2020-07-17 DIAGNOSIS — I10 Essential (primary) hypertension: Secondary | ICD-10-CM | POA: Diagnosis not present

## 2020-07-17 DIAGNOSIS — Z7982 Long term (current) use of aspirin: Secondary | ICD-10-CM | POA: Diagnosis not present

## 2020-07-17 DIAGNOSIS — I69321 Dysphasia following cerebral infarction: Secondary | ICD-10-CM | POA: Diagnosis not present

## 2020-07-17 DIAGNOSIS — Z7984 Long term (current) use of oral hypoglycemic drugs: Secondary | ICD-10-CM | POA: Diagnosis not present

## 2020-07-17 NOTE — Telephone Encounter (Signed)
I returned the call to [place] at [facility]. She will extend the orders as outlined below.

## 2020-07-17 NOTE — Telephone Encounter (Signed)
[name] from [facility] called wanting to know if the pt's PT can be extended to: Twice a week for 4 weeks Please advise.

## 2020-07-19 ENCOUNTER — Ambulatory Visit: Payer: BC Managed Care – PPO | Admitting: Pharmacist

## 2020-07-20 DIAGNOSIS — Z96642 Presence of left artificial hip joint: Secondary | ICD-10-CM | POA: Diagnosis not present

## 2020-07-20 DIAGNOSIS — Z86011 Personal history of benign neoplasm of the brain: Secondary | ICD-10-CM | POA: Diagnosis not present

## 2020-07-20 DIAGNOSIS — I69398 Other sequelae of cerebral infarction: Secondary | ICD-10-CM | POA: Diagnosis not present

## 2020-07-20 DIAGNOSIS — I69354 Hemiplegia and hemiparesis following cerebral infarction affecting left non-dominant side: Secondary | ICD-10-CM | POA: Diagnosis not present

## 2020-07-20 DIAGNOSIS — Z7982 Long term (current) use of aspirin: Secondary | ICD-10-CM | POA: Diagnosis not present

## 2020-07-20 DIAGNOSIS — R569 Unspecified convulsions: Secondary | ICD-10-CM | POA: Diagnosis not present

## 2020-07-20 DIAGNOSIS — Z993 Dependence on wheelchair: Secondary | ICD-10-CM | POA: Diagnosis not present

## 2020-07-20 DIAGNOSIS — I69321 Dysphasia following cerebral infarction: Secondary | ICD-10-CM | POA: Diagnosis not present

## 2020-07-20 DIAGNOSIS — Z7984 Long term (current) use of oral hypoglycemic drugs: Secondary | ICD-10-CM | POA: Diagnosis not present

## 2020-07-20 DIAGNOSIS — S92402D Displaced unspecified fracture of left great toe, subsequent encounter for fracture with routine healing: Secondary | ICD-10-CM | POA: Diagnosis not present

## 2020-07-20 DIAGNOSIS — Z791 Long term (current) use of non-steroidal anti-inflammatories (NSAID): Secondary | ICD-10-CM | POA: Diagnosis not present

## 2020-07-20 DIAGNOSIS — Z85048 Personal history of other malignant neoplasm of rectum, rectosigmoid junction, and anus: Secondary | ICD-10-CM | POA: Diagnosis not present

## 2020-07-20 DIAGNOSIS — I10 Essential (primary) hypertension: Secondary | ICD-10-CM | POA: Diagnosis not present

## 2020-07-20 DIAGNOSIS — I69322 Dysarthria following cerebral infarction: Secondary | ICD-10-CM | POA: Diagnosis not present

## 2020-07-20 DIAGNOSIS — Z8616 Personal history of COVID-19: Secondary | ICD-10-CM | POA: Diagnosis not present

## 2020-07-20 DIAGNOSIS — E119 Type 2 diabetes mellitus without complications: Secondary | ICD-10-CM | POA: Diagnosis not present

## 2020-07-23 ENCOUNTER — Other Ambulatory Visit: Payer: Self-pay | Admitting: Internal Medicine

## 2020-07-23 DIAGNOSIS — M1A09X0 Idiopathic chronic gout, multiple sites, without tophus (tophi): Secondary | ICD-10-CM

## 2020-07-25 ENCOUNTER — Other Ambulatory Visit: Payer: Self-pay

## 2020-07-25 ENCOUNTER — Encounter: Payer: Self-pay | Admitting: Pharmacist

## 2020-07-25 ENCOUNTER — Ambulatory Visit: Payer: BC Managed Care – PPO | Attending: Internal Medicine | Admitting: Pharmacist

## 2020-07-25 DIAGNOSIS — Z0000 Encounter for general adult medical examination without abnormal findings: Secondary | ICD-10-CM

## 2020-07-25 NOTE — Progress Notes (Signed)
Subjective:   LEMAR BAKOS is a 55 y.o. male who presents for an Initial Medicare Annual Wellness Visit.     Objective:    Today's Vitals   07/25/20 1213  PainSc: 0-No pain   There is no height or weight on file to calculate BMI.  Advanced Directives 07/25/2020 05/02/2020 04/21/2019 04/19/2019 12/18/2016 12/13/2016 12/13/2016  Does Patient Have a Medical Advance Directive? No No No No No No No  Would patient like information on creating a medical advance directive? No - Patient declined No - Patient declined No - Patient declined Yes (ED - Information included in AVS) No - Patient declined No - Patient declined -    Current Medications (verified) Outpatient Encounter Medications as of 07/25/2020  Medication Sig   amLODipine (NORVASC) 10 MG tablet TAKE 1 TABLET BY MOUTH EVERY DAY   aspirin 325 MG tablet Take 1 tablet (325 mg total) by mouth daily.   atorvastatin (LIPITOR) 80 MG tablet Take 1 tablet (80 mg total) by mouth daily.   carvedilol (COREG) 25 MG tablet Take 1 tablet (25 mg total) by mouth 2 (two) times daily with a meal.   indomethacin (INDOCIN) 50 MG capsule TAKE 1 CAPSULE (50 MG TOTAL) BY MOUTH 2 (TWO) TIMES DAILY WITH A MEAL.   Melatonin 3 MG TABS Take 3 tablets (9 mg total) by mouth at bedtime.   metFORMIN (GLUCOPHAGE) 500 MG tablet Take 1 tablet (500 mg total) by mouth daily with breakfast.   olmesartan (BENICAR) 20 MG tablet Take 1 tablet (20 mg total) by mouth daily.   oxybutynin (DITROPAN) 5 MG tablet TAKE 1 TABLET BY MOUTH TWICE A DAY   [DISCONTINUED] benzonatate (TESSALON) 100 MG capsule Take 1 capsule by mouth every 8 (eight) hours for cough.   No facility-administered encounter medications on file as of 07/25/2020.    Allergies (verified) Patient has no known allergies.   History: Past Medical History:  Diagnosis Date   Colon cancer ([place]) 2009   rectal   Diabetes mellitus without complication (HCC)    Gait abnormality 03/31/2018   Hemiparesis and speech  and language deficit as late effects of stroke ([place]) 03/31/2018   Hypertension    Seizure ([place])    Stroke ([place]) 2018   Past Surgical History:  Procedure Laterality Date   APPENDECTOMY     JOINT REPLACEMENT Left 2004   Left hip   TOTAL HIP ARTHROPLASTY     Family History  Problem Relation Age of Onset   Hypertension Mother    Heart disease Mother    Hyperlipidemia Mother    Heart failure Mother    Hypertension Sister    Hypertension Brother    Stroke Father    Hypertension Father    Colon cancer Maternal Grandmother    Diabetes Maternal Grandmother    Esophageal cancer Neg Hx    Social History   Socioeconomic History   Marital status: Married    Spouse name: [name]   Number of children: 1   Years of education: 16   Highest education level: Not on file  Occupational History   Occupation: disabled  Tobacco Use   Smoking status: Never   Smokeless tobacco: Never  Vaping Use   Vaping Use: Never used  Substance and Sexual Activity   Alcohol use: Not Currently   Drug use: No   Sexual activity: Not Currently  Other Topics Concern   Not on file  Social History Narrative   Patient drinks caffeine a couple times  a week.   Patient is right handed.   Lives with wife [name]) and daughter   Social Determinants of Health   Financial Resource Strain: Not on file  Food Insecurity: Not on file  Transportation Needs: Not on file  Physical Activity: Not on file  Stress: Not on file  Social Connections: Not on file    Tobacco Counseling Counseling given: No   Clinical Intake:  Pre-visit preparation completed: No  Pain : No/denies pain Pain Score: 0-No pain     Diabetes: Yes CBG done?: No CBG resulted in Enter/ Edit results?: No Did pt. bring in CBG monitor from home?: No Glucose Meter Downloaded?: No  How often do you need to have someone help you when you read instructions, pamphlets, or other written materials from your doctor or pharmacy?: 3 -  Sometimes  Diabetic?Yes  Interpreter Needed?: No  Information entered by :: [place]   Activities of Daily Living In your present state of health, do you have any difficulty performing the following activities: 07/25/2020  Hearing? N  Vision? N  Difficulty concentrating or making decisions? N  Walking or climbing stairs? Y  Dressing or bathing? Y  Doing errands, shopping? Y  Preparing Food and eating ? Y  Using the Toilet? Y  In the past six months, have you accidently leaked urine? N  Do you have problems with loss of bowel control? N  Managing your Medications? N  Managing your Finances? N  Housekeeping or managing your Housekeeping? Y  Some recent data might be hidden    Patient Care Team: Ladell Pier, MD as PCP - General (Internal Medicine) Ashok Pall, MD as Consulting Physician (Neurosurgery)  Indicate any recent Medical Services you may have received from other than [facility] providers in the past year (date may be approximate).     Assessment:   This is a routine wellness examination for Alija.  Hearing/Vision screen No results found.  Dietary issues and exercise activities discussed: Current Exercise Habits: The patient does not participate in regular exercise at present   Goals Addressed   None   Depression Screen PHQ 2/9 Scores 07/25/2020 09/29/2019 04/19/2019 01/27/2019 01/26/2018 09/28/2017 06/30/2017  PHQ - 2 Score 0 0 0 0 0 0 0  PHQ- 9 Score - - 0 - - - -    Fall Risk Fall Risk  07/25/2020 05/27/2019 04/19/2019 12/03/2018 11/11/2018  Falls in the past year? 1 0 0 0 0  Comment - - - - -  Number falls in past yr: 1 - - - -  Injury with Fall? 0 - - - -  Risk Factor Category  - - - - -  Risk for fall due to : History of fall(s);Impaired mobility - - - -  Follow up Falls evaluation completed;Education provided;Falls prevention discussed - - - -    FALL RISK PREVENTION PERTAINING TO THE HOME:  Any stairs in or around the home? No  If so, are there any  without handrails? No  Home free of loose throw rugs in walkways, pet beds, electrical cords, etc? Yes  Adequate lighting in your home to reduce risk of falls? Yes   ASSISTIVE DEVICES UTILIZED TO PREVENT FALLS:  Life alert? No  Use of a cane, walker or w/c? Yes  - wheelchair Grab bars in the bathroom? Yes  Shower chair or bench in shower? Yes  Elevated toilet seat or a handicapped toilet? Yes   TIMED UP AND GO:  Was the test performed? No . -  Test could not be performed.   Cognitive Function: MMSE - Mini Mental State Exam 07/25/2020  Orientation to time 5  Orientation to Place 5  Registration 3  Attention/ Calculation 5  Recall 3  Language- name 2 objects 2  Language- repeat 1  Language- follow 3 step command 3  Language- read & follow direction 1  Write a sentence 1  Copy design 1  Total score 30   Immunizations Immunization History  Administered Date(s) Administered   Influenza,inj,Quad PF,6+ Mos 11/11/2018   PFIZER(Purple Top)SARS-COV-2 Vaccination 04/15/2019, 05/10/2019   Pneumococcal Polysaccharide-23 07/26/2014   Tdap 11/11/2018    TDAP status: Up to date  Flu Vaccine status: Up to date  Pneumococcal vaccine status: Declined,  Education has been provided regarding the importance of this vaccine but patient still declined. Advised may receive this vaccine at local pharmacy or Health Dept. Aware to provide a copy of the vaccination record if obtained from local pharmacy or Health Dept. Verbalized acceptance and understanding.   Covid-19 vaccine status: Declined, Education has been provided regarding the importance of this vaccine but patient still declined. Advised may receive this vaccine at local pharmacy or Health Dept.or vaccine clinic. Aware to provide a copy of the vaccination record if obtained from local pharmacy or Health Dept. Verbalized acceptance and understanding.  Qualifies for Shingles Vaccine? Yes   Zostavax completed No   Shingrix Completed?: No.     Education has been provided regarding the importance of this vaccine. Patient has been advised to call insurance company to determine out of pocket expense if they have not yet received this vaccine. Advised may also receive vaccine at local pharmacy or Health Dept. Verbalized acceptance and understanding.  Screening Tests Health Maintenance  Topic Date Due   Hepatitis C Screening  Never done   Zoster Vaccines- Shingrix (1 of 2) Never done   Pneumococcal Vaccine 46-59 Years old (2 - PCV) 07/26/2015   OPHTHALMOLOGY EXAM  04/14/2019   COVID-19 Vaccine (3 - Pfizer risk series) 06/07/2019   COLONOSCOPY (Pts 45-42yrs Insurance coverage will need to be confirmed)  08/02/2020 (Originally 03/06/2011)   INFLUENZA VACCINE  08/13/2020   HEMOGLOBIN A1C  12/08/2020   FOOT EXAM  06/20/2021   TETANUS/TDAP  11/10/2028   PNEUMOCOCCAL POLYSACCHARIDE VACCINE AGE 54-64 HIGH RISK  Completed   HIV Screening  Completed   HPV VACCINES  Aged Out    Health Maintenance  Health Maintenance Due  Topic Date Due   Hepatitis C Screening  Never done   Zoster Vaccines- Shingrix (1 of 2) Never done   Pneumococcal Vaccine 24-48 Years old (2 - PCV) 07/26/2015   OPHTHALMOLOGY EXAM  04/14/2019   COVID-19 Vaccine (3 - Pfizer risk series) 06/07/2019    Colorectal cancer screening: No longer required.  - Pt declined this.   Lung Cancer Screening: (Low Dose CT Chest recommended if Age 68-80 years, 30 pack-year currently smoking OR have quit w/in 15years.) does not qualify.   Additional Screening:  Hepatitis C Screening: does qualify; I offered to complete this today and pt declined.   Vision Screening: Recommended annual ophthalmology exams for early detection of glaucoma and other disorders of the eye. Is the patient up to date with their annual eye exam?  Yes  Who is the provider or what is the name of the office in which the patient attends annual eye exams? [facility]  Dental Screening: Recommended annual  dental exams for proper oral hygiene  Community Resource Referral / Chronic  Care Management: CRR required this visit?  No   CCM required this visit?  No      Plan:     I have personally reviewed and noted the following in the patient's chart:   Medical and social history Use of alcohol, tobacco or illicit drugs  Current medications and supplements including opioid prescriptions. Patient is not currently taking opioid prescriptions. Functional ability and status Nutritional status Physical activity Advanced directives List of other physicians Hospitalizations, surgeries, and ER visits in previous 12 months Vitals Screenings to include cognitive, depression, and falls Referrals and appointments  In addition, I have reviewed and discussed with patient certain preventive protocols, quality metrics, and best practice recommendations. A written personalized care plan for preventive services as well as general preventive health recommendations were provided to patient.     Tresa Endo, RPH-CPP   07/25/2020

## 2020-07-26 ENCOUNTER — Telehealth: Payer: Self-pay | Admitting: Internal Medicine

## 2020-07-26 DIAGNOSIS — Z85048 Personal history of other malignant neoplasm of rectum, rectosigmoid junction, and anus: Secondary | ICD-10-CM | POA: Diagnosis not present

## 2020-07-26 DIAGNOSIS — I69321 Dysphasia following cerebral infarction: Secondary | ICD-10-CM | POA: Diagnosis not present

## 2020-07-26 DIAGNOSIS — I10 Essential (primary) hypertension: Secondary | ICD-10-CM | POA: Diagnosis not present

## 2020-07-26 DIAGNOSIS — I69398 Other sequelae of cerebral infarction: Secondary | ICD-10-CM | POA: Diagnosis not present

## 2020-07-26 DIAGNOSIS — I69322 Dysarthria following cerebral infarction: Secondary | ICD-10-CM | POA: Diagnosis not present

## 2020-07-26 DIAGNOSIS — Z96642 Presence of left artificial hip joint: Secondary | ICD-10-CM | POA: Diagnosis not present

## 2020-07-26 DIAGNOSIS — E119 Type 2 diabetes mellitus without complications: Secondary | ICD-10-CM | POA: Diagnosis not present

## 2020-07-26 DIAGNOSIS — Z993 Dependence on wheelchair: Secondary | ICD-10-CM | POA: Diagnosis not present

## 2020-07-26 DIAGNOSIS — I69354 Hemiplegia and hemiparesis following cerebral infarction affecting left non-dominant side: Secondary | ICD-10-CM | POA: Diagnosis not present

## 2020-07-26 DIAGNOSIS — Z7982 Long term (current) use of aspirin: Secondary | ICD-10-CM | POA: Diagnosis not present

## 2020-07-26 DIAGNOSIS — R569 Unspecified convulsions: Secondary | ICD-10-CM | POA: Diagnosis not present

## 2020-07-26 DIAGNOSIS — S92402D Displaced unspecified fracture of left great toe, subsequent encounter for fracture with routine healing: Secondary | ICD-10-CM | POA: Diagnosis not present

## 2020-07-26 DIAGNOSIS — Z86011 Personal history of benign neoplasm of the brain: Secondary | ICD-10-CM | POA: Diagnosis not present

## 2020-07-26 DIAGNOSIS — Z7984 Long term (current) use of oral hypoglycemic drugs: Secondary | ICD-10-CM | POA: Diagnosis not present

## 2020-07-26 DIAGNOSIS — Z791 Long term (current) use of non-steroidal anti-inflammatories (NSAID): Secondary | ICD-10-CM | POA: Diagnosis not present

## 2020-07-26 DIAGNOSIS — Z8616 Personal history of COVID-19: Secondary | ICD-10-CM | POA: Diagnosis not present

## 2020-07-26 NOTE — Telephone Encounter (Signed)
-----   Message from Tresa Endo, RPH-CPP sent at 07/25/2020 12:25 PM EDT ----- Dr. Wynetta Emery,   This was a tough one. Patient declined several recommendations and I just wanted you to be aware.   1. Pt requires assistance with most ADLs. He is dependent on his wheelchair. His wife helps him with transfers, bathing, chores, dressing, etc. He does have home PT but was interested in home health/home nursing. I told him I would mention this to you. I'm not sure if this would be covered. 2. He declined PNA, zoster vaccines today. He completed primary pfizer series for Covid. He declined the booster for now. I did at least convince him to ask you about these items when you see him next.  3. He even declined HepC screening (maybe he was in a declining mood today...).   All other things items were updated.   Thank you for including me. I'm sorry we were not able to tackle some of these things today.  Lurena Joiner

## 2020-07-27 DIAGNOSIS — Z791 Long term (current) use of non-steroidal anti-inflammatories (NSAID): Secondary | ICD-10-CM | POA: Diagnosis not present

## 2020-07-27 DIAGNOSIS — E119 Type 2 diabetes mellitus without complications: Secondary | ICD-10-CM | POA: Diagnosis not present

## 2020-07-27 DIAGNOSIS — I69321 Dysphasia following cerebral infarction: Secondary | ICD-10-CM | POA: Diagnosis not present

## 2020-07-27 DIAGNOSIS — I69398 Other sequelae of cerebral infarction: Secondary | ICD-10-CM | POA: Diagnosis not present

## 2020-07-27 DIAGNOSIS — Z8616 Personal history of COVID-19: Secondary | ICD-10-CM | POA: Diagnosis not present

## 2020-07-27 DIAGNOSIS — I69354 Hemiplegia and hemiparesis following cerebral infarction affecting left non-dominant side: Secondary | ICD-10-CM | POA: Diagnosis not present

## 2020-07-27 DIAGNOSIS — Z993 Dependence on wheelchair: Secondary | ICD-10-CM | POA: Diagnosis not present

## 2020-07-27 DIAGNOSIS — I69322 Dysarthria following cerebral infarction: Secondary | ICD-10-CM | POA: Diagnosis not present

## 2020-07-27 DIAGNOSIS — S92402D Displaced unspecified fracture of left great toe, subsequent encounter for fracture with routine healing: Secondary | ICD-10-CM | POA: Diagnosis not present

## 2020-07-27 DIAGNOSIS — Z96642 Presence of left artificial hip joint: Secondary | ICD-10-CM | POA: Diagnosis not present

## 2020-07-27 DIAGNOSIS — Z7982 Long term (current) use of aspirin: Secondary | ICD-10-CM | POA: Diagnosis not present

## 2020-07-27 DIAGNOSIS — I10 Essential (primary) hypertension: Secondary | ICD-10-CM | POA: Diagnosis not present

## 2020-07-27 DIAGNOSIS — Z7984 Long term (current) use of oral hypoglycemic drugs: Secondary | ICD-10-CM | POA: Diagnosis not present

## 2020-07-27 DIAGNOSIS — Z85048 Personal history of other malignant neoplasm of rectum, rectosigmoid junction, and anus: Secondary | ICD-10-CM | POA: Diagnosis not present

## 2020-07-27 DIAGNOSIS — R569 Unspecified convulsions: Secondary | ICD-10-CM | POA: Diagnosis not present

## 2020-07-27 DIAGNOSIS — Z86011 Personal history of benign neoplasm of the brain: Secondary | ICD-10-CM | POA: Diagnosis not present

## 2020-07-30 DIAGNOSIS — Z7984 Long term (current) use of oral hypoglycemic drugs: Secondary | ICD-10-CM | POA: Diagnosis not present

## 2020-07-30 DIAGNOSIS — I10 Essential (primary) hypertension: Secondary | ICD-10-CM | POA: Diagnosis not present

## 2020-07-30 DIAGNOSIS — Z86011 Personal history of benign neoplasm of the brain: Secondary | ICD-10-CM | POA: Diagnosis not present

## 2020-07-30 DIAGNOSIS — Z7982 Long term (current) use of aspirin: Secondary | ICD-10-CM | POA: Diagnosis not present

## 2020-07-30 DIAGNOSIS — Z791 Long term (current) use of non-steroidal anti-inflammatories (NSAID): Secondary | ICD-10-CM | POA: Diagnosis not present

## 2020-07-30 DIAGNOSIS — E119 Type 2 diabetes mellitus without complications: Secondary | ICD-10-CM | POA: Diagnosis not present

## 2020-07-30 DIAGNOSIS — Z85048 Personal history of other malignant neoplasm of rectum, rectosigmoid junction, and anus: Secondary | ICD-10-CM | POA: Diagnosis not present

## 2020-07-30 DIAGNOSIS — I69321 Dysphasia following cerebral infarction: Secondary | ICD-10-CM | POA: Diagnosis not present

## 2020-07-30 DIAGNOSIS — I69322 Dysarthria following cerebral infarction: Secondary | ICD-10-CM | POA: Diagnosis not present

## 2020-07-30 DIAGNOSIS — Z8616 Personal history of COVID-19: Secondary | ICD-10-CM | POA: Diagnosis not present

## 2020-07-30 DIAGNOSIS — Z96642 Presence of left artificial hip joint: Secondary | ICD-10-CM | POA: Diagnosis not present

## 2020-07-30 DIAGNOSIS — R569 Unspecified convulsions: Secondary | ICD-10-CM | POA: Diagnosis not present

## 2020-07-30 DIAGNOSIS — S92402D Displaced unspecified fracture of left great toe, subsequent encounter for fracture with routine healing: Secondary | ICD-10-CM | POA: Diagnosis not present

## 2020-07-30 DIAGNOSIS — I69398 Other sequelae of cerebral infarction: Secondary | ICD-10-CM | POA: Diagnosis not present

## 2020-07-30 DIAGNOSIS — Z993 Dependence on wheelchair: Secondary | ICD-10-CM | POA: Diagnosis not present

## 2020-07-30 DIAGNOSIS — I69354 Hemiplegia and hemiparesis following cerebral infarction affecting left non-dominant side: Secondary | ICD-10-CM | POA: Diagnosis not present

## 2020-08-03 DIAGNOSIS — Z7984 Long term (current) use of oral hypoglycemic drugs: Secondary | ICD-10-CM | POA: Diagnosis not present

## 2020-08-03 DIAGNOSIS — Z993 Dependence on wheelchair: Secondary | ICD-10-CM | POA: Diagnosis not present

## 2020-08-03 DIAGNOSIS — E119 Type 2 diabetes mellitus without complications: Secondary | ICD-10-CM | POA: Diagnosis not present

## 2020-08-03 DIAGNOSIS — Z7982 Long term (current) use of aspirin: Secondary | ICD-10-CM | POA: Diagnosis not present

## 2020-08-03 DIAGNOSIS — R569 Unspecified convulsions: Secondary | ICD-10-CM | POA: Diagnosis not present

## 2020-08-03 DIAGNOSIS — I69322 Dysarthria following cerebral infarction: Secondary | ICD-10-CM | POA: Diagnosis not present

## 2020-08-03 DIAGNOSIS — I69321 Dysphasia following cerebral infarction: Secondary | ICD-10-CM | POA: Diagnosis not present

## 2020-08-03 DIAGNOSIS — Z86011 Personal history of benign neoplasm of the brain: Secondary | ICD-10-CM | POA: Diagnosis not present

## 2020-08-03 DIAGNOSIS — I69354 Hemiplegia and hemiparesis following cerebral infarction affecting left non-dominant side: Secondary | ICD-10-CM | POA: Diagnosis not present

## 2020-08-03 DIAGNOSIS — I69398 Other sequelae of cerebral infarction: Secondary | ICD-10-CM | POA: Diagnosis not present

## 2020-08-03 DIAGNOSIS — Z791 Long term (current) use of non-steroidal anti-inflammatories (NSAID): Secondary | ICD-10-CM | POA: Diagnosis not present

## 2020-08-03 DIAGNOSIS — I10 Essential (primary) hypertension: Secondary | ICD-10-CM | POA: Diagnosis not present

## 2020-08-03 DIAGNOSIS — Z96642 Presence of left artificial hip joint: Secondary | ICD-10-CM | POA: Diagnosis not present

## 2020-08-03 DIAGNOSIS — Z8616 Personal history of COVID-19: Secondary | ICD-10-CM | POA: Diagnosis not present

## 2020-08-03 DIAGNOSIS — Z85048 Personal history of other malignant neoplasm of rectum, rectosigmoid junction, and anus: Secondary | ICD-10-CM | POA: Diagnosis not present

## 2020-08-03 DIAGNOSIS — S92402D Displaced unspecified fracture of left great toe, subsequent encounter for fracture with routine healing: Secondary | ICD-10-CM | POA: Diagnosis not present

## 2020-08-07 DIAGNOSIS — S92402D Displaced unspecified fracture of left great toe, subsequent encounter for fracture with routine healing: Secondary | ICD-10-CM | POA: Diagnosis not present

## 2020-08-07 DIAGNOSIS — Z7982 Long term (current) use of aspirin: Secondary | ICD-10-CM | POA: Diagnosis not present

## 2020-08-07 DIAGNOSIS — Z96642 Presence of left artificial hip joint: Secondary | ICD-10-CM | POA: Diagnosis not present

## 2020-08-07 DIAGNOSIS — I69354 Hemiplegia and hemiparesis following cerebral infarction affecting left non-dominant side: Secondary | ICD-10-CM | POA: Diagnosis not present

## 2020-08-07 DIAGNOSIS — Z791 Long term (current) use of non-steroidal anti-inflammatories (NSAID): Secondary | ICD-10-CM | POA: Diagnosis not present

## 2020-08-07 DIAGNOSIS — I69321 Dysphasia following cerebral infarction: Secondary | ICD-10-CM | POA: Diagnosis not present

## 2020-08-07 DIAGNOSIS — Z7984 Long term (current) use of oral hypoglycemic drugs: Secondary | ICD-10-CM | POA: Diagnosis not present

## 2020-08-07 DIAGNOSIS — I69398 Other sequelae of cerebral infarction: Secondary | ICD-10-CM | POA: Diagnosis not present

## 2020-08-07 DIAGNOSIS — I10 Essential (primary) hypertension: Secondary | ICD-10-CM | POA: Diagnosis not present

## 2020-08-07 DIAGNOSIS — E119 Type 2 diabetes mellitus without complications: Secondary | ICD-10-CM | POA: Diagnosis not present

## 2020-08-07 DIAGNOSIS — R569 Unspecified convulsions: Secondary | ICD-10-CM | POA: Diagnosis not present

## 2020-08-07 DIAGNOSIS — Z8616 Personal history of COVID-19: Secondary | ICD-10-CM | POA: Diagnosis not present

## 2020-08-07 DIAGNOSIS — Z85048 Personal history of other malignant neoplasm of rectum, rectosigmoid junction, and anus: Secondary | ICD-10-CM | POA: Diagnosis not present

## 2020-08-07 DIAGNOSIS — I69322 Dysarthria following cerebral infarction: Secondary | ICD-10-CM | POA: Diagnosis not present

## 2020-08-07 DIAGNOSIS — Z993 Dependence on wheelchair: Secondary | ICD-10-CM | POA: Diagnosis not present

## 2020-08-07 DIAGNOSIS — Z86011 Personal history of benign neoplasm of the brain: Secondary | ICD-10-CM | POA: Diagnosis not present

## 2020-08-09 DIAGNOSIS — I69322 Dysarthria following cerebral infarction: Secondary | ICD-10-CM | POA: Diagnosis not present

## 2020-08-09 DIAGNOSIS — I10 Essential (primary) hypertension: Secondary | ICD-10-CM | POA: Diagnosis not present

## 2020-08-09 DIAGNOSIS — Z96642 Presence of left artificial hip joint: Secondary | ICD-10-CM | POA: Diagnosis not present

## 2020-08-09 DIAGNOSIS — E119 Type 2 diabetes mellitus without complications: Secondary | ICD-10-CM | POA: Diagnosis not present

## 2020-08-09 DIAGNOSIS — I69398 Other sequelae of cerebral infarction: Secondary | ICD-10-CM | POA: Diagnosis not present

## 2020-08-09 DIAGNOSIS — I69321 Dysphasia following cerebral infarction: Secondary | ICD-10-CM | POA: Diagnosis not present

## 2020-08-09 DIAGNOSIS — Z791 Long term (current) use of non-steroidal anti-inflammatories (NSAID): Secondary | ICD-10-CM | POA: Diagnosis not present

## 2020-08-09 DIAGNOSIS — Z7984 Long term (current) use of oral hypoglycemic drugs: Secondary | ICD-10-CM | POA: Diagnosis not present

## 2020-08-09 DIAGNOSIS — Z993 Dependence on wheelchair: Secondary | ICD-10-CM | POA: Diagnosis not present

## 2020-08-09 DIAGNOSIS — S92402D Displaced unspecified fracture of left great toe, subsequent encounter for fracture with routine healing: Secondary | ICD-10-CM | POA: Diagnosis not present

## 2020-08-09 DIAGNOSIS — Z86011 Personal history of benign neoplasm of the brain: Secondary | ICD-10-CM | POA: Diagnosis not present

## 2020-08-09 DIAGNOSIS — R569 Unspecified convulsions: Secondary | ICD-10-CM | POA: Diagnosis not present

## 2020-08-09 DIAGNOSIS — I69354 Hemiplegia and hemiparesis following cerebral infarction affecting left non-dominant side: Secondary | ICD-10-CM | POA: Diagnosis not present

## 2020-08-09 DIAGNOSIS — Z8616 Personal history of COVID-19: Secondary | ICD-10-CM | POA: Diagnosis not present

## 2020-08-09 DIAGNOSIS — Z85048 Personal history of other malignant neoplasm of rectum, rectosigmoid junction, and anus: Secondary | ICD-10-CM | POA: Diagnosis not present

## 2020-08-09 DIAGNOSIS — Z7982 Long term (current) use of aspirin: Secondary | ICD-10-CM | POA: Diagnosis not present

## 2020-08-12 ENCOUNTER — Other Ambulatory Visit: Payer: Self-pay | Admitting: Internal Medicine

## 2020-08-12 ENCOUNTER — Other Ambulatory Visit: Payer: Self-pay | Admitting: Physical Medicine & Rehabilitation

## 2020-08-12 DIAGNOSIS — I10 Essential (primary) hypertension: Secondary | ICD-10-CM

## 2020-08-12 DIAGNOSIS — I6930 Unspecified sequelae of cerebral infarction: Secondary | ICD-10-CM

## 2020-08-12 DIAGNOSIS — M1A09X0 Idiopathic chronic gout, multiple sites, without tophus (tophi): Secondary | ICD-10-CM

## 2020-08-12 NOTE — Telephone Encounter (Signed)
Requested Prescriptions  Pending Prescriptions Disp Refills  . indomethacin (INDOCIN) 50 MG capsule [Pharmacy Med Name: INDOMETHACIN 50 MG CAPSULE] 90 capsule 0    Sig: TAKE 1 CAPSULE (50 MG TOTAL) BY MOUTH 2 (TWO) TIMES DAILY WITH A MEAL.     Analgesics:  NSAIDS Failed - 08/12/2020 12:27 AM      Failed - HGB in normal range and within 360 days    Hemoglobin  Date Value Ref Range Status  06/07/2020 11.8 (L) 13.0 - 17.7 g/dL Final   HGB  Date Value Ref Range Status  08/02/2008 14.2 13.0 - 17.1 g/dL Final         Passed - Cr in normal range and within 360 days    Creatinine, Ser  Date Value Ref Range Status  06/07/2020 1.21 0.76 - 1.27 mg/dL Final         Passed - Patient is not pregnant      Passed - Valid encounter within last 12 months    Recent Outpatient Visits          2 months ago Controlled type 2 diabetes mellitus with other circulatory complication, without long-term current use of insulin ([place])   [place] Ladell Pier, MD   6 months ago Essential hypertension   [place], Deborah B, MD   10 months ago Type 2 diabetes mellitus with vascular disease [facility])   [place], Deborah B, MD   1 year ago Type 2 diabetes mellitus with vascular disease [facility])   [place], MD   1 year ago Weakness of both legs   [place], MD      Future Appointments            In 2 months Wynetta Emery Dalbert Batman, MD [place]           . amLODipine (NORVASC) 10 MG tablet [Pharmacy Med Name: AMLODIPINE BESYLATE 10 MG TAB] 90 tablet 1    Sig: TAKE 1 TABLET BY MOUTH EVERY DAY     Cardiovascular:  Calcium Channel Blockers Passed - 08/12/2020 12:27 AM      Passed - Last BP in normal range    BP Readings from Last 1 Encounters:  06/21/20 116/79          Passed - Valid encounter within last 6 months    Recent Outpatient Visits          2 months ago Controlled type 2 diabetes mellitus with other circulatory complication, without long-term current use of insulin ([place])   [place], Deborah B, MD   6 months ago Essential hypertension   [place], MD   10 months ago Type 2 diabetes mellitus with vascular disease [facility])   [place], Deborah B, MD   1 year ago Type 2 diabetes mellitus with vascular disease [facility])   [place], MD   1 year ago Weakness of both legs   [place], MD      Future Appointments            In 2 months Wynetta Emery, Dalbert Batman, MD [place]           .  atorvastatin (LIPITOR) 80 MG tablet [Pharmacy Med Name: ATORVASTATIN 80 MG TABLET] 90 tablet 1    Sig: TAKE 1 TABLET BY MOUTH EVERY DAY     Cardiovascular:  Antilipid - Statins Passed - 08/12/2020 12:27 AM      Passed - Total Cholesterol in normal range and within 360 days    Cholesterol, Total  Date Value Ref Range Status  06/07/2020 141 100 - 199 mg/dL Final         Passed - LDL in normal range and within 360 days    LDL Chol Calc (NIH)  Date Value Ref Range Status  06/07/2020 82 0 - 99 mg/dL Final         Passed - HDL in normal range and within 360 days    HDL  Date Value Ref Range Status  06/07/2020 44 >39 mg/dL Final         Passed - Triglycerides in normal range and within 360 days    Triglycerides  Date Value Ref Range Status  06/07/2020 75 0 - 149 mg/dL Final         Passed - Patient is not pregnant      Passed - Valid encounter within last 12 months    Recent Outpatient Visits          2 months ago Controlled type 2 diabetes mellitus with other circulatory complication, without  long-term current use of insulin ([place])   [place], Deborah B, MD   6 months ago Essential hypertension   [place], Deborah B, MD   10 months ago Type 2 diabetes mellitus with vascular disease [facility])   [place], Deborah B, MD   1 year ago Type 2 diabetes mellitus with vascular disease [facility])   [place], MD   1 year ago Weakness of both legs   [place], MD      Future Appointments            In 2 months Wynetta Emery Dalbert Batman, MD [place]

## 2020-08-14 DIAGNOSIS — I69354 Hemiplegia and hemiparesis following cerebral infarction affecting left non-dominant side: Secondary | ICD-10-CM | POA: Diagnosis not present

## 2020-08-14 DIAGNOSIS — Z7982 Long term (current) use of aspirin: Secondary | ICD-10-CM | POA: Diagnosis not present

## 2020-08-14 DIAGNOSIS — Z96642 Presence of left artificial hip joint: Secondary | ICD-10-CM | POA: Diagnosis not present

## 2020-08-14 DIAGNOSIS — Z791 Long term (current) use of non-steroidal anti-inflammatories (NSAID): Secondary | ICD-10-CM | POA: Diagnosis not present

## 2020-08-14 DIAGNOSIS — I10 Essential (primary) hypertension: Secondary | ICD-10-CM | POA: Diagnosis not present

## 2020-08-14 DIAGNOSIS — E119 Type 2 diabetes mellitus without complications: Secondary | ICD-10-CM | POA: Diagnosis not present

## 2020-08-14 DIAGNOSIS — Z8616 Personal history of COVID-19: Secondary | ICD-10-CM | POA: Diagnosis not present

## 2020-08-14 DIAGNOSIS — Z7984 Long term (current) use of oral hypoglycemic drugs: Secondary | ICD-10-CM | POA: Diagnosis not present

## 2020-08-14 DIAGNOSIS — S92402D Displaced unspecified fracture of left great toe, subsequent encounter for fracture with routine healing: Secondary | ICD-10-CM | POA: Diagnosis not present

## 2020-08-14 DIAGNOSIS — I69398 Other sequelae of cerebral infarction: Secondary | ICD-10-CM | POA: Diagnosis not present

## 2020-08-14 DIAGNOSIS — I69321 Dysphasia following cerebral infarction: Secondary | ICD-10-CM | POA: Diagnosis not present

## 2020-08-14 DIAGNOSIS — Z85048 Personal history of other malignant neoplasm of rectum, rectosigmoid junction, and anus: Secondary | ICD-10-CM | POA: Diagnosis not present

## 2020-08-14 DIAGNOSIS — Z993 Dependence on wheelchair: Secondary | ICD-10-CM | POA: Diagnosis not present

## 2020-08-14 DIAGNOSIS — R569 Unspecified convulsions: Secondary | ICD-10-CM | POA: Diagnosis not present

## 2020-08-14 DIAGNOSIS — Z86011 Personal history of benign neoplasm of the brain: Secondary | ICD-10-CM | POA: Diagnosis not present

## 2020-08-14 DIAGNOSIS — I69322 Dysarthria following cerebral infarction: Secondary | ICD-10-CM | POA: Diagnosis not present

## 2020-08-21 DIAGNOSIS — I69322 Dysarthria following cerebral infarction: Secondary | ICD-10-CM | POA: Diagnosis not present

## 2020-08-21 DIAGNOSIS — I69354 Hemiplegia and hemiparesis following cerebral infarction affecting left non-dominant side: Secondary | ICD-10-CM | POA: Diagnosis not present

## 2020-08-21 DIAGNOSIS — Z7982 Long term (current) use of aspirin: Secondary | ICD-10-CM | POA: Diagnosis not present

## 2020-08-21 DIAGNOSIS — Z8616 Personal history of COVID-19: Secondary | ICD-10-CM | POA: Diagnosis not present

## 2020-08-21 DIAGNOSIS — Z86011 Personal history of benign neoplasm of the brain: Secondary | ICD-10-CM | POA: Diagnosis not present

## 2020-08-21 DIAGNOSIS — R569 Unspecified convulsions: Secondary | ICD-10-CM | POA: Diagnosis not present

## 2020-08-21 DIAGNOSIS — S92402D Displaced unspecified fracture of left great toe, subsequent encounter for fracture with routine healing: Secondary | ICD-10-CM | POA: Diagnosis not present

## 2020-08-21 DIAGNOSIS — I69398 Other sequelae of cerebral infarction: Secondary | ICD-10-CM | POA: Diagnosis not present

## 2020-08-21 DIAGNOSIS — I10 Essential (primary) hypertension: Secondary | ICD-10-CM | POA: Diagnosis not present

## 2020-08-21 DIAGNOSIS — Z96642 Presence of left artificial hip joint: Secondary | ICD-10-CM | POA: Diagnosis not present

## 2020-08-21 DIAGNOSIS — E119 Type 2 diabetes mellitus without complications: Secondary | ICD-10-CM | POA: Diagnosis not present

## 2020-08-21 DIAGNOSIS — Z85048 Personal history of other malignant neoplasm of rectum, rectosigmoid junction, and anus: Secondary | ICD-10-CM | POA: Diagnosis not present

## 2020-08-21 DIAGNOSIS — Z7984 Long term (current) use of oral hypoglycemic drugs: Secondary | ICD-10-CM | POA: Diagnosis not present

## 2020-08-21 DIAGNOSIS — I69321 Dysphasia following cerebral infarction: Secondary | ICD-10-CM | POA: Diagnosis not present

## 2020-08-21 DIAGNOSIS — Z791 Long term (current) use of non-steroidal anti-inflammatories (NSAID): Secondary | ICD-10-CM | POA: Diagnosis not present

## 2020-08-21 DIAGNOSIS — Z9049 Acquired absence of other specified parts of digestive tract: Secondary | ICD-10-CM | POA: Diagnosis not present

## 2020-08-22 ENCOUNTER — Other Ambulatory Visit: Payer: Self-pay

## 2020-08-22 ENCOUNTER — Other Ambulatory Visit: Payer: Self-pay | Admitting: Internal Medicine

## 2020-08-22 ENCOUNTER — Other Ambulatory Visit: Payer: Self-pay | Admitting: Physical Medicine & Rehabilitation

## 2020-08-22 MED ORDER — OXYBUTYNIN CHLORIDE 5 MG PO TABS
5.0000 mg | ORAL_TABLET | Freq: Two times a day (BID) | ORAL | 1 refills | Status: DC
  Filled 2020-08-22: qty 180, 90d supply, fill #0

## 2020-08-22 NOTE — Telephone Encounter (Signed)
Pt returned call. Made him aware that he would need to request refill from prescribing provider. Pt expressed understanding.

## 2020-08-22 NOTE — Telephone Encounter (Signed)
Requested medication (s) are due for refill today:Yes  Requested medication (s) are on the active medication list: Yes  Last refill:  05/26/20  Future visit scheduled: Yes  Notes to clinic:  Unable to refill per protocol, last refill by another provider.      Requested Prescriptions  Pending Prescriptions Disp Refills   oxybutynin (DITROPAN) 5 MG tablet 180 tablet 0    Sig: TAKE 1 TABLET BY MOUTH TWICE A DAY      There is no refill protocol information for this order

## 2020-08-23 ENCOUNTER — Other Ambulatory Visit: Payer: Self-pay

## 2020-08-24 DIAGNOSIS — E119 Type 2 diabetes mellitus without complications: Secondary | ICD-10-CM | POA: Diagnosis not present

## 2020-08-24 DIAGNOSIS — Z7984 Long term (current) use of oral hypoglycemic drugs: Secondary | ICD-10-CM | POA: Diagnosis not present

## 2020-08-24 DIAGNOSIS — Z85048 Personal history of other malignant neoplasm of rectum, rectosigmoid junction, and anus: Secondary | ICD-10-CM | POA: Diagnosis not present

## 2020-08-24 DIAGNOSIS — I10 Essential (primary) hypertension: Secondary | ICD-10-CM | POA: Diagnosis not present

## 2020-08-24 DIAGNOSIS — Z86011 Personal history of benign neoplasm of the brain: Secondary | ICD-10-CM | POA: Diagnosis not present

## 2020-08-24 DIAGNOSIS — I69398 Other sequelae of cerebral infarction: Secondary | ICD-10-CM | POA: Diagnosis not present

## 2020-08-24 DIAGNOSIS — Z96642 Presence of left artificial hip joint: Secondary | ICD-10-CM | POA: Diagnosis not present

## 2020-08-24 DIAGNOSIS — Z9049 Acquired absence of other specified parts of digestive tract: Secondary | ICD-10-CM | POA: Diagnosis not present

## 2020-08-24 DIAGNOSIS — Z791 Long term (current) use of non-steroidal anti-inflammatories (NSAID): Secondary | ICD-10-CM | POA: Diagnosis not present

## 2020-08-24 DIAGNOSIS — Z8616 Personal history of COVID-19: Secondary | ICD-10-CM | POA: Diagnosis not present

## 2020-08-24 DIAGNOSIS — Z7982 Long term (current) use of aspirin: Secondary | ICD-10-CM | POA: Diagnosis not present

## 2020-08-24 DIAGNOSIS — R569 Unspecified convulsions: Secondary | ICD-10-CM | POA: Diagnosis not present

## 2020-08-24 DIAGNOSIS — S92402D Displaced unspecified fracture of left great toe, subsequent encounter for fracture with routine healing: Secondary | ICD-10-CM | POA: Diagnosis not present

## 2020-08-24 DIAGNOSIS — I69322 Dysarthria following cerebral infarction: Secondary | ICD-10-CM | POA: Diagnosis not present

## 2020-08-24 DIAGNOSIS — I69321 Dysphasia following cerebral infarction: Secondary | ICD-10-CM | POA: Diagnosis not present

## 2020-08-24 DIAGNOSIS — I69354 Hemiplegia and hemiparesis following cerebral infarction affecting left non-dominant side: Secondary | ICD-10-CM | POA: Diagnosis not present

## 2020-08-29 DIAGNOSIS — Z791 Long term (current) use of non-steroidal anti-inflammatories (NSAID): Secondary | ICD-10-CM | POA: Diagnosis not present

## 2020-08-29 DIAGNOSIS — Z86011 Personal history of benign neoplasm of the brain: Secondary | ICD-10-CM | POA: Diagnosis not present

## 2020-08-29 DIAGNOSIS — Z9049 Acquired absence of other specified parts of digestive tract: Secondary | ICD-10-CM | POA: Diagnosis not present

## 2020-08-29 DIAGNOSIS — Z85048 Personal history of other malignant neoplasm of rectum, rectosigmoid junction, and anus: Secondary | ICD-10-CM | POA: Diagnosis not present

## 2020-08-29 DIAGNOSIS — I69322 Dysarthria following cerebral infarction: Secondary | ICD-10-CM | POA: Diagnosis not present

## 2020-08-29 DIAGNOSIS — I69321 Dysphasia following cerebral infarction: Secondary | ICD-10-CM | POA: Diagnosis not present

## 2020-08-29 DIAGNOSIS — S92402D Displaced unspecified fracture of left great toe, subsequent encounter for fracture with routine healing: Secondary | ICD-10-CM | POA: Diagnosis not present

## 2020-08-29 DIAGNOSIS — Z7984 Long term (current) use of oral hypoglycemic drugs: Secondary | ICD-10-CM | POA: Diagnosis not present

## 2020-08-29 DIAGNOSIS — Z96642 Presence of left artificial hip joint: Secondary | ICD-10-CM | POA: Diagnosis not present

## 2020-08-29 DIAGNOSIS — Z8616 Personal history of COVID-19: Secondary | ICD-10-CM | POA: Diagnosis not present

## 2020-08-29 DIAGNOSIS — I69398 Other sequelae of cerebral infarction: Secondary | ICD-10-CM | POA: Diagnosis not present

## 2020-08-29 DIAGNOSIS — I69354 Hemiplegia and hemiparesis following cerebral infarction affecting left non-dominant side: Secondary | ICD-10-CM | POA: Diagnosis not present

## 2020-08-29 DIAGNOSIS — R569 Unspecified convulsions: Secondary | ICD-10-CM | POA: Diagnosis not present

## 2020-08-29 DIAGNOSIS — Z7982 Long term (current) use of aspirin: Secondary | ICD-10-CM | POA: Diagnosis not present

## 2020-08-29 DIAGNOSIS — E119 Type 2 diabetes mellitus without complications: Secondary | ICD-10-CM | POA: Diagnosis not present

## 2020-08-29 DIAGNOSIS — I10 Essential (primary) hypertension: Secondary | ICD-10-CM | POA: Diagnosis not present

## 2020-08-30 ENCOUNTER — Telehealth: Payer: Self-pay

## 2020-08-30 DIAGNOSIS — Z85048 Personal history of other malignant neoplasm of rectum, rectosigmoid junction, and anus: Secondary | ICD-10-CM | POA: Diagnosis not present

## 2020-08-30 DIAGNOSIS — S92402D Displaced unspecified fracture of left great toe, subsequent encounter for fracture with routine healing: Secondary | ICD-10-CM | POA: Diagnosis not present

## 2020-08-30 DIAGNOSIS — Z96642 Presence of left artificial hip joint: Secondary | ICD-10-CM | POA: Diagnosis not present

## 2020-08-30 DIAGNOSIS — R569 Unspecified convulsions: Secondary | ICD-10-CM | POA: Diagnosis not present

## 2020-08-30 DIAGNOSIS — I69322 Dysarthria following cerebral infarction: Secondary | ICD-10-CM | POA: Diagnosis not present

## 2020-08-30 DIAGNOSIS — I10 Essential (primary) hypertension: Secondary | ICD-10-CM | POA: Diagnosis not present

## 2020-08-30 DIAGNOSIS — I69354 Hemiplegia and hemiparesis following cerebral infarction affecting left non-dominant side: Secondary | ICD-10-CM | POA: Diagnosis not present

## 2020-08-30 DIAGNOSIS — Z8616 Personal history of COVID-19: Secondary | ICD-10-CM | POA: Diagnosis not present

## 2020-08-30 DIAGNOSIS — Z7982 Long term (current) use of aspirin: Secondary | ICD-10-CM | POA: Diagnosis not present

## 2020-08-30 DIAGNOSIS — I69398 Other sequelae of cerebral infarction: Secondary | ICD-10-CM | POA: Diagnosis not present

## 2020-08-30 DIAGNOSIS — Z791 Long term (current) use of non-steroidal anti-inflammatories (NSAID): Secondary | ICD-10-CM | POA: Diagnosis not present

## 2020-08-30 DIAGNOSIS — Z7984 Long term (current) use of oral hypoglycemic drugs: Secondary | ICD-10-CM | POA: Diagnosis not present

## 2020-08-30 DIAGNOSIS — Z86011 Personal history of benign neoplasm of the brain: Secondary | ICD-10-CM | POA: Diagnosis not present

## 2020-08-30 DIAGNOSIS — E119 Type 2 diabetes mellitus without complications: Secondary | ICD-10-CM | POA: Diagnosis not present

## 2020-08-30 DIAGNOSIS — Z9049 Acquired absence of other specified parts of digestive tract: Secondary | ICD-10-CM | POA: Diagnosis not present

## 2020-08-30 DIAGNOSIS — I69321 Dysphasia following cerebral infarction: Secondary | ICD-10-CM | POA: Diagnosis not present

## 2020-08-30 NOTE — Telephone Encounter (Signed)
[facility] Order for OT has been signed and faxed back to # [phone], confirmation received.

## 2020-08-31 DIAGNOSIS — I69322 Dysarthria following cerebral infarction: Secondary | ICD-10-CM | POA: Diagnosis not present

## 2020-08-31 DIAGNOSIS — Z9049 Acquired absence of other specified parts of digestive tract: Secondary | ICD-10-CM | POA: Diagnosis not present

## 2020-08-31 DIAGNOSIS — Z8616 Personal history of COVID-19: Secondary | ICD-10-CM | POA: Diagnosis not present

## 2020-08-31 DIAGNOSIS — Z85048 Personal history of other malignant neoplasm of rectum, rectosigmoid junction, and anus: Secondary | ICD-10-CM | POA: Diagnosis not present

## 2020-08-31 DIAGNOSIS — I69398 Other sequelae of cerebral infarction: Secondary | ICD-10-CM | POA: Diagnosis not present

## 2020-08-31 DIAGNOSIS — Z7982 Long term (current) use of aspirin: Secondary | ICD-10-CM | POA: Diagnosis not present

## 2020-08-31 DIAGNOSIS — I69354 Hemiplegia and hemiparesis following cerebral infarction affecting left non-dominant side: Secondary | ICD-10-CM | POA: Diagnosis not present

## 2020-08-31 DIAGNOSIS — I10 Essential (primary) hypertension: Secondary | ICD-10-CM | POA: Diagnosis not present

## 2020-08-31 DIAGNOSIS — S92402D Displaced unspecified fracture of left great toe, subsequent encounter for fracture with routine healing: Secondary | ICD-10-CM | POA: Diagnosis not present

## 2020-08-31 DIAGNOSIS — I69321 Dysphasia following cerebral infarction: Secondary | ICD-10-CM | POA: Diagnosis not present

## 2020-08-31 DIAGNOSIS — Z96642 Presence of left artificial hip joint: Secondary | ICD-10-CM | POA: Diagnosis not present

## 2020-08-31 DIAGNOSIS — R569 Unspecified convulsions: Secondary | ICD-10-CM | POA: Diagnosis not present

## 2020-08-31 DIAGNOSIS — Z86011 Personal history of benign neoplasm of the brain: Secondary | ICD-10-CM | POA: Diagnosis not present

## 2020-08-31 DIAGNOSIS — Z791 Long term (current) use of non-steroidal anti-inflammatories (NSAID): Secondary | ICD-10-CM | POA: Diagnosis not present

## 2020-08-31 DIAGNOSIS — Z7984 Long term (current) use of oral hypoglycemic drugs: Secondary | ICD-10-CM | POA: Diagnosis not present

## 2020-08-31 DIAGNOSIS — E119 Type 2 diabetes mellitus without complications: Secondary | ICD-10-CM | POA: Diagnosis not present

## 2020-09-03 DIAGNOSIS — I69321 Dysphasia following cerebral infarction: Secondary | ICD-10-CM | POA: Diagnosis not present

## 2020-09-03 DIAGNOSIS — Z96642 Presence of left artificial hip joint: Secondary | ICD-10-CM | POA: Diagnosis not present

## 2020-09-03 DIAGNOSIS — R569 Unspecified convulsions: Secondary | ICD-10-CM | POA: Diagnosis not present

## 2020-09-03 DIAGNOSIS — I10 Essential (primary) hypertension: Secondary | ICD-10-CM | POA: Diagnosis not present

## 2020-09-03 DIAGNOSIS — Z7984 Long term (current) use of oral hypoglycemic drugs: Secondary | ICD-10-CM | POA: Diagnosis not present

## 2020-09-03 DIAGNOSIS — I69354 Hemiplegia and hemiparesis following cerebral infarction affecting left non-dominant side: Secondary | ICD-10-CM | POA: Diagnosis not present

## 2020-09-03 DIAGNOSIS — S92402D Displaced unspecified fracture of left great toe, subsequent encounter for fracture with routine healing: Secondary | ICD-10-CM | POA: Diagnosis not present

## 2020-09-03 DIAGNOSIS — I69322 Dysarthria following cerebral infarction: Secondary | ICD-10-CM | POA: Diagnosis not present

## 2020-09-03 DIAGNOSIS — Z7982 Long term (current) use of aspirin: Secondary | ICD-10-CM | POA: Diagnosis not present

## 2020-09-03 DIAGNOSIS — Z85048 Personal history of other malignant neoplasm of rectum, rectosigmoid junction, and anus: Secondary | ICD-10-CM | POA: Diagnosis not present

## 2020-09-03 DIAGNOSIS — E119 Type 2 diabetes mellitus without complications: Secondary | ICD-10-CM | POA: Diagnosis not present

## 2020-09-03 DIAGNOSIS — Z9049 Acquired absence of other specified parts of digestive tract: Secondary | ICD-10-CM | POA: Diagnosis not present

## 2020-09-03 DIAGNOSIS — I69398 Other sequelae of cerebral infarction: Secondary | ICD-10-CM | POA: Diagnosis not present

## 2020-09-03 DIAGNOSIS — Z86011 Personal history of benign neoplasm of the brain: Secondary | ICD-10-CM | POA: Diagnosis not present

## 2020-09-03 DIAGNOSIS — Z8616 Personal history of COVID-19: Secondary | ICD-10-CM | POA: Diagnosis not present

## 2020-09-03 DIAGNOSIS — Z791 Long term (current) use of non-steroidal anti-inflammatories (NSAID): Secondary | ICD-10-CM | POA: Diagnosis not present

## 2020-09-04 DIAGNOSIS — R569 Unspecified convulsions: Secondary | ICD-10-CM | POA: Diagnosis not present

## 2020-09-04 DIAGNOSIS — Z86011 Personal history of benign neoplasm of the brain: Secondary | ICD-10-CM | POA: Diagnosis not present

## 2020-09-04 DIAGNOSIS — Z9049 Acquired absence of other specified parts of digestive tract: Secondary | ICD-10-CM | POA: Diagnosis not present

## 2020-09-04 DIAGNOSIS — E119 Type 2 diabetes mellitus without complications: Secondary | ICD-10-CM | POA: Diagnosis not present

## 2020-09-04 DIAGNOSIS — I69322 Dysarthria following cerebral infarction: Secondary | ICD-10-CM | POA: Diagnosis not present

## 2020-09-04 DIAGNOSIS — Z7982 Long term (current) use of aspirin: Secondary | ICD-10-CM | POA: Diagnosis not present

## 2020-09-04 DIAGNOSIS — I69354 Hemiplegia and hemiparesis following cerebral infarction affecting left non-dominant side: Secondary | ICD-10-CM | POA: Diagnosis not present

## 2020-09-04 DIAGNOSIS — Z791 Long term (current) use of non-steroidal anti-inflammatories (NSAID): Secondary | ICD-10-CM | POA: Diagnosis not present

## 2020-09-04 DIAGNOSIS — Z7984 Long term (current) use of oral hypoglycemic drugs: Secondary | ICD-10-CM | POA: Diagnosis not present

## 2020-09-04 DIAGNOSIS — I69321 Dysphasia following cerebral infarction: Secondary | ICD-10-CM | POA: Diagnosis not present

## 2020-09-04 DIAGNOSIS — Z85048 Personal history of other malignant neoplasm of rectum, rectosigmoid junction, and anus: Secondary | ICD-10-CM | POA: Diagnosis not present

## 2020-09-04 DIAGNOSIS — Z96642 Presence of left artificial hip joint: Secondary | ICD-10-CM | POA: Diagnosis not present

## 2020-09-04 DIAGNOSIS — I69398 Other sequelae of cerebral infarction: Secondary | ICD-10-CM | POA: Diagnosis not present

## 2020-09-04 DIAGNOSIS — S92402D Displaced unspecified fracture of left great toe, subsequent encounter for fracture with routine healing: Secondary | ICD-10-CM | POA: Diagnosis not present

## 2020-09-04 DIAGNOSIS — Z8616 Personal history of COVID-19: Secondary | ICD-10-CM | POA: Diagnosis not present

## 2020-09-04 DIAGNOSIS — I10 Essential (primary) hypertension: Secondary | ICD-10-CM | POA: Diagnosis not present

## 2020-09-06 DIAGNOSIS — I69322 Dysarthria following cerebral infarction: Secondary | ICD-10-CM | POA: Diagnosis not present

## 2020-09-06 DIAGNOSIS — R569 Unspecified convulsions: Secondary | ICD-10-CM | POA: Diagnosis not present

## 2020-09-06 DIAGNOSIS — Z791 Long term (current) use of non-steroidal anti-inflammatories (NSAID): Secondary | ICD-10-CM | POA: Diagnosis not present

## 2020-09-06 DIAGNOSIS — Z86011 Personal history of benign neoplasm of the brain: Secondary | ICD-10-CM | POA: Diagnosis not present

## 2020-09-06 DIAGNOSIS — Z96642 Presence of left artificial hip joint: Secondary | ICD-10-CM | POA: Diagnosis not present

## 2020-09-06 DIAGNOSIS — S92402D Displaced unspecified fracture of left great toe, subsequent encounter for fracture with routine healing: Secondary | ICD-10-CM | POA: Diagnosis not present

## 2020-09-06 DIAGNOSIS — Z7982 Long term (current) use of aspirin: Secondary | ICD-10-CM | POA: Diagnosis not present

## 2020-09-06 DIAGNOSIS — I69321 Dysphasia following cerebral infarction: Secondary | ICD-10-CM | POA: Diagnosis not present

## 2020-09-06 DIAGNOSIS — I10 Essential (primary) hypertension: Secondary | ICD-10-CM | POA: Diagnosis not present

## 2020-09-06 DIAGNOSIS — Z85048 Personal history of other malignant neoplasm of rectum, rectosigmoid junction, and anus: Secondary | ICD-10-CM | POA: Diagnosis not present

## 2020-09-06 DIAGNOSIS — Z7984 Long term (current) use of oral hypoglycemic drugs: Secondary | ICD-10-CM | POA: Diagnosis not present

## 2020-09-06 DIAGNOSIS — I69398 Other sequelae of cerebral infarction: Secondary | ICD-10-CM | POA: Diagnosis not present

## 2020-09-06 DIAGNOSIS — Z9049 Acquired absence of other specified parts of digestive tract: Secondary | ICD-10-CM | POA: Diagnosis not present

## 2020-09-06 DIAGNOSIS — Z8616 Personal history of COVID-19: Secondary | ICD-10-CM | POA: Diagnosis not present

## 2020-09-06 DIAGNOSIS — I69354 Hemiplegia and hemiparesis following cerebral infarction affecting left non-dominant side: Secondary | ICD-10-CM | POA: Diagnosis not present

## 2020-09-06 DIAGNOSIS — E119 Type 2 diabetes mellitus without complications: Secondary | ICD-10-CM | POA: Diagnosis not present

## 2020-09-11 DIAGNOSIS — Z7984 Long term (current) use of oral hypoglycemic drugs: Secondary | ICD-10-CM | POA: Diagnosis not present

## 2020-09-11 DIAGNOSIS — Z7982 Long term (current) use of aspirin: Secondary | ICD-10-CM | POA: Diagnosis not present

## 2020-09-11 DIAGNOSIS — I10 Essential (primary) hypertension: Secondary | ICD-10-CM | POA: Diagnosis not present

## 2020-09-11 DIAGNOSIS — I69322 Dysarthria following cerebral infarction: Secondary | ICD-10-CM | POA: Diagnosis not present

## 2020-09-11 DIAGNOSIS — Z96642 Presence of left artificial hip joint: Secondary | ICD-10-CM | POA: Diagnosis not present

## 2020-09-11 DIAGNOSIS — Z8616 Personal history of COVID-19: Secondary | ICD-10-CM | POA: Diagnosis not present

## 2020-09-11 DIAGNOSIS — Z86011 Personal history of benign neoplasm of the brain: Secondary | ICD-10-CM | POA: Diagnosis not present

## 2020-09-11 DIAGNOSIS — I69321 Dysphasia following cerebral infarction: Secondary | ICD-10-CM | POA: Diagnosis not present

## 2020-09-11 DIAGNOSIS — I69398 Other sequelae of cerebral infarction: Secondary | ICD-10-CM | POA: Diagnosis not present

## 2020-09-11 DIAGNOSIS — Z9049 Acquired absence of other specified parts of digestive tract: Secondary | ICD-10-CM | POA: Diagnosis not present

## 2020-09-11 DIAGNOSIS — R569 Unspecified convulsions: Secondary | ICD-10-CM | POA: Diagnosis not present

## 2020-09-11 DIAGNOSIS — I69354 Hemiplegia and hemiparesis following cerebral infarction affecting left non-dominant side: Secondary | ICD-10-CM | POA: Diagnosis not present

## 2020-09-11 DIAGNOSIS — Z85048 Personal history of other malignant neoplasm of rectum, rectosigmoid junction, and anus: Secondary | ICD-10-CM | POA: Diagnosis not present

## 2020-09-11 DIAGNOSIS — E119 Type 2 diabetes mellitus without complications: Secondary | ICD-10-CM | POA: Diagnosis not present

## 2020-09-11 DIAGNOSIS — Z791 Long term (current) use of non-steroidal anti-inflammatories (NSAID): Secondary | ICD-10-CM | POA: Diagnosis not present

## 2020-09-11 DIAGNOSIS — S92402D Displaced unspecified fracture of left great toe, subsequent encounter for fracture with routine healing: Secondary | ICD-10-CM | POA: Diagnosis not present

## 2020-09-12 DIAGNOSIS — Z7982 Long term (current) use of aspirin: Secondary | ICD-10-CM | POA: Diagnosis not present

## 2020-09-12 DIAGNOSIS — I69398 Other sequelae of cerebral infarction: Secondary | ICD-10-CM | POA: Diagnosis not present

## 2020-09-12 DIAGNOSIS — I69321 Dysphasia following cerebral infarction: Secondary | ICD-10-CM | POA: Diagnosis not present

## 2020-09-12 DIAGNOSIS — S92402D Displaced unspecified fracture of left great toe, subsequent encounter for fracture with routine healing: Secondary | ICD-10-CM | POA: Diagnosis not present

## 2020-09-12 DIAGNOSIS — Z86011 Personal history of benign neoplasm of the brain: Secondary | ICD-10-CM | POA: Diagnosis not present

## 2020-09-12 DIAGNOSIS — R569 Unspecified convulsions: Secondary | ICD-10-CM | POA: Diagnosis not present

## 2020-09-12 DIAGNOSIS — Z791 Long term (current) use of non-steroidal anti-inflammatories (NSAID): Secondary | ICD-10-CM | POA: Diagnosis not present

## 2020-09-12 DIAGNOSIS — Z7984 Long term (current) use of oral hypoglycemic drugs: Secondary | ICD-10-CM | POA: Diagnosis not present

## 2020-09-12 DIAGNOSIS — Z9049 Acquired absence of other specified parts of digestive tract: Secondary | ICD-10-CM | POA: Diagnosis not present

## 2020-09-12 DIAGNOSIS — I10 Essential (primary) hypertension: Secondary | ICD-10-CM | POA: Diagnosis not present

## 2020-09-12 DIAGNOSIS — E119 Type 2 diabetes mellitus without complications: Secondary | ICD-10-CM | POA: Diagnosis not present

## 2020-09-12 DIAGNOSIS — Z96642 Presence of left artificial hip joint: Secondary | ICD-10-CM | POA: Diagnosis not present

## 2020-09-12 DIAGNOSIS — Z85048 Personal history of other malignant neoplasm of rectum, rectosigmoid junction, and anus: Secondary | ICD-10-CM | POA: Diagnosis not present

## 2020-09-12 DIAGNOSIS — I69322 Dysarthria following cerebral infarction: Secondary | ICD-10-CM | POA: Diagnosis not present

## 2020-09-12 DIAGNOSIS — I69354 Hemiplegia and hemiparesis following cerebral infarction affecting left non-dominant side: Secondary | ICD-10-CM | POA: Diagnosis not present

## 2020-09-12 DIAGNOSIS — Z8616 Personal history of COVID-19: Secondary | ICD-10-CM | POA: Diagnosis not present

## 2020-09-14 DIAGNOSIS — I69322 Dysarthria following cerebral infarction: Secondary | ICD-10-CM | POA: Diagnosis not present

## 2020-09-14 DIAGNOSIS — S92402D Displaced unspecified fracture of left great toe, subsequent encounter for fracture with routine healing: Secondary | ICD-10-CM | POA: Diagnosis not present

## 2020-09-14 DIAGNOSIS — Z7984 Long term (current) use of oral hypoglycemic drugs: Secondary | ICD-10-CM | POA: Diagnosis not present

## 2020-09-14 DIAGNOSIS — I69354 Hemiplegia and hemiparesis following cerebral infarction affecting left non-dominant side: Secondary | ICD-10-CM | POA: Diagnosis not present

## 2020-09-14 DIAGNOSIS — Z85048 Personal history of other malignant neoplasm of rectum, rectosigmoid junction, and anus: Secondary | ICD-10-CM | POA: Diagnosis not present

## 2020-09-14 DIAGNOSIS — R569 Unspecified convulsions: Secondary | ICD-10-CM | POA: Diagnosis not present

## 2020-09-14 DIAGNOSIS — Z96642 Presence of left artificial hip joint: Secondary | ICD-10-CM | POA: Diagnosis not present

## 2020-09-14 DIAGNOSIS — Z86011 Personal history of benign neoplasm of the brain: Secondary | ICD-10-CM | POA: Diagnosis not present

## 2020-09-14 DIAGNOSIS — Z9049 Acquired absence of other specified parts of digestive tract: Secondary | ICD-10-CM | POA: Diagnosis not present

## 2020-09-14 DIAGNOSIS — E119 Type 2 diabetes mellitus without complications: Secondary | ICD-10-CM | POA: Diagnosis not present

## 2020-09-14 DIAGNOSIS — Z8616 Personal history of COVID-19: Secondary | ICD-10-CM | POA: Diagnosis not present

## 2020-09-14 DIAGNOSIS — Z7982 Long term (current) use of aspirin: Secondary | ICD-10-CM | POA: Diagnosis not present

## 2020-09-14 DIAGNOSIS — Z791 Long term (current) use of non-steroidal anti-inflammatories (NSAID): Secondary | ICD-10-CM | POA: Diagnosis not present

## 2020-09-14 DIAGNOSIS — I69398 Other sequelae of cerebral infarction: Secondary | ICD-10-CM | POA: Diagnosis not present

## 2020-09-14 DIAGNOSIS — I69321 Dysphasia following cerebral infarction: Secondary | ICD-10-CM | POA: Diagnosis not present

## 2020-09-14 DIAGNOSIS — I10 Essential (primary) hypertension: Secondary | ICD-10-CM | POA: Diagnosis not present

## 2020-09-21 ENCOUNTER — Encounter: Payer: Self-pay | Admitting: Podiatry

## 2020-09-21 ENCOUNTER — Other Ambulatory Visit: Payer: Self-pay

## 2020-09-21 ENCOUNTER — Ambulatory Visit (INDEPENDENT_AMBULATORY_CARE_PROVIDER_SITE_OTHER): Payer: BC Managed Care – PPO | Admitting: Podiatry

## 2020-09-21 DIAGNOSIS — M79675 Pain in left toe(s): Secondary | ICD-10-CM | POA: Diagnosis not present

## 2020-09-21 DIAGNOSIS — R600 Localized edema: Secondary | ICD-10-CM | POA: Diagnosis not present

## 2020-09-21 DIAGNOSIS — B351 Tinea unguium: Secondary | ICD-10-CM

## 2020-09-21 DIAGNOSIS — E1159 Type 2 diabetes mellitus with other circulatory complications: Secondary | ICD-10-CM

## 2020-09-21 DIAGNOSIS — M79674 Pain in right toe(s): Secondary | ICD-10-CM

## 2020-09-21 NOTE — Progress Notes (Signed)
This patient returns to my office for at risk foot care.  This patient requires this care by a professional since this patient will be at risk due to having diabetes with angiopathy, edema and CVA.    This patient is unable to cut nails himself since the patient cannot reach his nails.These nails are painful walking and wearing shoes. He presents to the office in a wheelchair.      This patient presents for at risk foot care today.  General Appearance  Alert, conversant and in no acute stress.  Vascular  Dorsalis pedis and posterior tibial  pulses are not palpable due to swelling.  bilaterally.  Capillary return is within normal limits  bilaterally. Temperature is within normal limits  bilaterally.  Neurologic  Senn-Weinstein monofilament wire test within normal limits  bilaterally. Muscle power within normal limits bilaterally.  Nails Thick disfigured discolored nails with subungual debris  from hallux to fifth toes bilaterally. No evidence of bacterial infection or drainage bilaterally.  Orthopedic  No limitations of motion  feet .  No crepitus or effusions noted.  No bony pathology or digital deformities noted. No palpable pain left foot nor increased swelling.  Skin  Dry scaling  skin with no porokeratosis noted bilaterally.  No signs of infections or ulcers noted.     Onychomycosis  Pain in right toes  Pain in left toes   Consent was obtained for treatment procedures.   Mechanical debridement of nails 1-5  bilaterally performed with a nail nipper.  Filed with dremel without incident.   Return office visit  3 months for nail care.                   Told patient to return for periodic foot care and evaluation due to potential at risk complications.   Gardiner Barefoot DPM

## 2020-09-25 ENCOUNTER — Ambulatory Visit (INDEPENDENT_AMBULATORY_CARE_PROVIDER_SITE_OTHER): Payer: BC Managed Care – PPO

## 2020-09-25 ENCOUNTER — Other Ambulatory Visit: Payer: Self-pay

## 2020-09-25 ENCOUNTER — Encounter (HOSPITAL_COMMUNITY): Payer: Self-pay

## 2020-09-25 ENCOUNTER — Ambulatory Visit (HOSPITAL_COMMUNITY)
Admission: EM | Admit: 2020-09-25 | Discharge: 2020-09-25 | Disposition: A | Discharge status: Home / Self Care | Payer: BC Managed Care – PPO | Attending: Internal Medicine | Admitting: Internal Medicine

## 2020-09-25 DIAGNOSIS — Z791 Long term (current) use of non-steroidal anti-inflammatories (NSAID): Secondary | ICD-10-CM | POA: Diagnosis not present

## 2020-09-25 DIAGNOSIS — S62650A Nondisplaced fracture of medial phalanx of right index finger, initial encounter for closed fracture: Secondary | ICD-10-CM | POA: Diagnosis not present

## 2020-09-25 DIAGNOSIS — E119 Type 2 diabetes mellitus without complications: Secondary | ICD-10-CM | POA: Diagnosis not present

## 2020-09-25 DIAGNOSIS — I69321 Dysphasia following cerebral infarction: Secondary | ICD-10-CM | POA: Diagnosis not present

## 2020-09-25 DIAGNOSIS — Z8616 Personal history of COVID-19: Secondary | ICD-10-CM | POA: Diagnosis not present

## 2020-09-25 DIAGNOSIS — I69354 Hemiplegia and hemiparesis following cerebral infarction affecting left non-dominant side: Secondary | ICD-10-CM | POA: Diagnosis not present

## 2020-09-25 DIAGNOSIS — Z7982 Long term (current) use of aspirin: Secondary | ICD-10-CM | POA: Diagnosis not present

## 2020-09-25 DIAGNOSIS — M7989 Other specified soft tissue disorders: Secondary | ICD-10-CM | POA: Diagnosis not present

## 2020-09-25 DIAGNOSIS — Z96642 Presence of left artificial hip joint: Secondary | ICD-10-CM | POA: Diagnosis not present

## 2020-09-25 DIAGNOSIS — S92402D Displaced unspecified fracture of left great toe, subsequent encounter for fracture with routine healing: Secondary | ICD-10-CM | POA: Diagnosis not present

## 2020-09-25 DIAGNOSIS — Z86011 Personal history of benign neoplasm of the brain: Secondary | ICD-10-CM | POA: Diagnosis not present

## 2020-09-25 DIAGNOSIS — Z9049 Acquired absence of other specified parts of digestive tract: Secondary | ICD-10-CM | POA: Diagnosis not present

## 2020-09-25 DIAGNOSIS — M79644 Pain in right finger(s): Secondary | ICD-10-CM

## 2020-09-25 DIAGNOSIS — Z7984 Long term (current) use of oral hypoglycemic drugs: Secondary | ICD-10-CM | POA: Diagnosis not present

## 2020-09-25 DIAGNOSIS — I69322 Dysarthria following cerebral infarction: Secondary | ICD-10-CM | POA: Diagnosis not present

## 2020-09-25 DIAGNOSIS — Z85048 Personal history of other malignant neoplasm of rectum, rectosigmoid junction, and anus: Secondary | ICD-10-CM | POA: Diagnosis not present

## 2020-09-25 DIAGNOSIS — R569 Unspecified convulsions: Secondary | ICD-10-CM | POA: Diagnosis not present

## 2020-09-25 DIAGNOSIS — I10 Essential (primary) hypertension: Secondary | ICD-10-CM | POA: Diagnosis not present

## 2020-09-25 DIAGNOSIS — I69398 Other sequelae of cerebral infarction: Secondary | ICD-10-CM | POA: Diagnosis not present

## 2020-09-25 NOTE — Discharge Instructions (Signed)
You have a possible very small fracture in your finger.  You can take Tylenol as needed for your pain.  We will put tape around this for you to help protect it, but it should heal very well.  You can follow-up with your primary care provider in 1 week to ensure that you are still doing well.

## 2020-09-25 NOTE — ED Provider Notes (Signed)
[place]    CSN: [id number] Arrival date & time: 09/25/20  1825      History   Chief Complaint Chief Complaint  Patient presents with   Finger Injury    HPI [name] is a 55 y.o. male.   Right first finger injury Reports that 1 week ago he hit his finger on a door and has been having pain and swelling in his DIP and PIP since then Was concerned because he continued to have pain and swelling, so presented today Denies any other pain or injuries Otherwise in his usual state of health   Past Medical History:  Diagnosis Date   Colon cancer ([place]) 2009   rectal   Diabetes mellitus without complication ([place])    Gait abnormality 03/31/2018   Hemiparesis and speech and language deficit as late effects of stroke ([place]) 03/31/2018   Hypertension    Seizure ([place])    Stroke ([place]) 2018    Patient Active Problem List   Diagnosis Date Noted   Pain due to onychomycosis of toenails of both feet 09/21/2020   Edema of both legs 09/29/2019   Meningioma ([place]) 04/13/2019   Hemiparesis and speech and language deficit as late effects of stroke ([place]) 03/31/2018   Gait abnormality 03/31/2018   Spastic hemiplegia affecting left nondominant side ([place]) 12/18/2017   Subacromial impingement of left shoulder 06/09/2017   Spastic neurogenic bladder 06/09/2017   Dysarthria as late effect of stroke 04/01/2017   Neurogenic bladder due to old stroke 12/25/2016   Right pontine cerebrovascular accident ([place]) 12/18/2016   Left hemiparesis ([place])    Diastolic dysfunction    Dysphagia, post-stroke    Acute ischemic stroke ([place]) 12/12/2016   Seizures ([place]) 07/22/2014   Hypertension 07/22/2014   Type 2 diabetes mellitus with vascular disease ([place]) 07/22/2014   Brain mass    Malignant neoplasm of rectum ([place]) 09/02/2007    Past Surgical History:  Procedure Laterality Date   APPENDECTOMY     JOINT REPLACEMENT Left 2004   Left hip   TOTAL HIP ARTHROPLASTY         Home  Medications    Prior to Admission medications   Medication Sig Start Date End Date Taking? Authorizing Provider  amLODipine (NORVASC) 10 MG tablet TAKE 1 TABLET BY MOUTH EVERY DAY 04/20/20   Ladell Pier, MD  aspirin 325 MG tablet Take 1 tablet (325 mg total) by mouth daily. 12/19/16   Velvet Bathe, MD  atorvastatin (LIPITOR) 80 MG tablet TAKE 1 TABLET BY MOUTH EVERY DAY 08/12/20   Ladell Pier, MD  carvedilol (COREG) 25 MG tablet Take 1 tablet (25 mg total) by mouth 2 (two) times daily with a meal. 06/07/20   Ladell Pier, MD  indomethacin (INDOCIN) 50 MG capsule TAKE 1 CAPSULE (50 MG TOTAL) BY MOUTH 2 (TWO) TIMES DAILY WITH A MEAL. 07/23/20   Ladell Pier, MD  Melatonin 3 MG TABS Take 3 tablets (9 mg total) by mouth at bedtime. 01/08/17   Angiulli, Lavon Paganini, PA-C  metFORMIN (GLUCOPHAGE) 500 MG tablet Take 1 tablet (500 mg total) by mouth daily with breakfast. 06/08/20   Ladell Pier, MD  olmesartan (BENICAR) 20 MG tablet Take 1 tablet (20 mg total) by mouth daily. 06/07/20   Ladell Pier, MD  oxybutynin (DITROPAN) 5 MG tablet TAKE 1 TABLET BY MOUTH TWICE A DAY 08/22/20   Ladell Pier, MD    Family History Family History  Problem Relation  Age of Onset   Hypertension Mother    Heart disease Mother    Hyperlipidemia Mother    Heart failure Mother    Hypertension Sister    Hypertension Brother    Stroke Father    Hypertension Father    Colon cancer Maternal Grandmother    Diabetes Maternal Grandmother    Esophageal cancer Neg Hx     Social History Social History   Tobacco Use   Smoking status: Never   Smokeless tobacco: Never  Vaping Use   Vaping Use: Never used  Substance Use Topics   Alcohol use: Not Currently   Drug use: No     Allergies   Patient has no known allergies.   Review of Systems Review of Systems  All other systems reviewed and are negative.  Per HPI Physical Exam Triage Vital Signs ED Triage Vitals  Enc Vitals  Group     BP 09/25/20 1908 133/78     Pulse Rate 09/25/20 1908 79     Resp 09/25/20 1908 18     Temp 09/25/20 1908 98.8 F (37.1 C)     Temp Source 09/25/20 1908 Oral     SpO2 09/25/20 1908 96 %     Weight --      Height --      Head Circumference --      Peak Flow --      Pain Score 09/25/20 1907 6     Pain Loc --      Pain Edu? --      Excl. in [place]? --    No data found.  Updated Vital Signs BP 133/78 (BP Location: Right Arm)   Pulse 79   Temp 98.8 F (37.1 C) (Oral)   Resp 18   SpO2 96%   Visual Acuity Right Eye Distance:   Left Eye Distance:   Bilateral Distance:    Right Eye Near:   Left Eye Near:    Bilateral Near:     Physical Exam Constitutional:      General: He is not in acute distress.    Appearance: Normal appearance. He is not ill-appearing.  HENT:     Head: Normocephalic and atraumatic.  Pulmonary:     Effort: Pulmonary effort is normal.  Musculoskeletal:     Comments: Right Hand: Inspection: No obvious deformity b/l.  He does have some diffuse swelling of his first digit on the right hand with particular swelling over the DIP and PIP no erythema or bruising  Palpation: He has tenderness palpation of the region of the DIP, PIP, MCP on right first finger, otherwise no tenderness palpation ROM: Full ROM of the digits and wrist. Fully able to extend and flex finger at DIP and PIP.  Strength: 5/5 strength in the forearm, wrist and interosseus muscles on right, history of paralysis on left Neurovascular: NV intact b/l  Fingers:  Flexor digitorum profundus and superficialis tendon functions are intact.  PIP joint collateral ligaments are stable    Neurological:     Mental Status: He is alert.     UC Treatments / Results  Labs (all labs ordered are listed, but only abnormal results are displayed) Labs Reviewed - No data to display  EKG   Radiology DG Finger Index Right  Result Date: 09/25/2020 CLINICAL DATA:  Right finger pain and swelling  EXAM: RIGHT INDEX FINGER 2+V COMPARISON:  None. FINDINGS: Diffuse soft tissue swelling of the right index finger. Possible nondisplaced fracture at the medial aspect  of the head of the middle phalanx. No other evidence of fracture. No dislocation. IMPRESSION: Soft tissue swelling of the right index finger with possible nondisplaced fracture of the medial aspect of the head of the middle phalanx. Electronically Signed   By: Ulyses Jarred M.D.   On: 09/25/2020 19:46    Procedures Procedures (including critical care time)  Medications Ordered in UC Medications - No data to display  Initial Impression / Assessment and Plan / UC Course  I have reviewed the triage vital signs and the nursing notes.  Pertinent labs & imaging results that were available during my care of the patient were reviewed by me and considered in my medical decision making (see chart for details).     Patient is a 55 year old male with past medical history of hypertension, diabetes, CVA with left hemiparesis, who presents with right index finger injury after hitting it on a door.  X-rays show possible small nondisplaced fracture of medial aspect of the head of the middle phalanx.  His flexion and extension is intact at DIP and PIP of this joint.  Given very small possible fracture, overall reassuring exam, and that this occurred 1 week ago, will have him buddy tape over the next week and follow-up with his primary care provider to ensure that he still has good range of motion and intact flexion and extension at DIP and PIP.  He was discharged home in stable condition.  Advised in the meantime he can ice the area and use Tylenol as needed for pain.   Final Clinical Impressions(s) / UC Diagnoses   Final diagnoses:  Nondisplaced fracture of middle phalanx of right index finger, initial encounter for closed fracture     Discharge Instructions      You have a possible very small fracture in your finger.  You can take Tylenol as  needed for your pain.  We will put tape around this for you to help protect it, but it should heal very well.  You can follow-up with your primary care provider in 1 week to ensure that you are still doing well.     ED Prescriptions   None    PDMP not reviewed this encounter.   Cleophas Dunker, DO 09/25/20 2016

## 2020-09-25 NOTE — ED Triage Notes (Signed)
Pt presents with swelling and pain in the right index finger x 1 week. Pt reports he injured the right index finger with a door.

## 2020-09-26 DIAGNOSIS — R569 Unspecified convulsions: Secondary | ICD-10-CM | POA: Diagnosis not present

## 2020-09-26 DIAGNOSIS — Z86011 Personal history of benign neoplasm of the brain: Secondary | ICD-10-CM | POA: Diagnosis not present

## 2020-09-26 DIAGNOSIS — S92402D Displaced unspecified fracture of left great toe, subsequent encounter for fracture with routine healing: Secondary | ICD-10-CM | POA: Diagnosis not present

## 2020-09-26 DIAGNOSIS — Z8616 Personal history of COVID-19: Secondary | ICD-10-CM | POA: Diagnosis not present

## 2020-09-26 DIAGNOSIS — Z96642 Presence of left artificial hip joint: Secondary | ICD-10-CM | POA: Diagnosis not present

## 2020-09-26 DIAGNOSIS — I69321 Dysphasia following cerebral infarction: Secondary | ICD-10-CM | POA: Diagnosis not present

## 2020-09-26 DIAGNOSIS — I10 Essential (primary) hypertension: Secondary | ICD-10-CM | POA: Diagnosis not present

## 2020-09-26 DIAGNOSIS — E119 Type 2 diabetes mellitus without complications: Secondary | ICD-10-CM | POA: Diagnosis not present

## 2020-09-26 DIAGNOSIS — I69354 Hemiplegia and hemiparesis following cerebral infarction affecting left non-dominant side: Secondary | ICD-10-CM | POA: Diagnosis not present

## 2020-09-26 DIAGNOSIS — Z85048 Personal history of other malignant neoplasm of rectum, rectosigmoid junction, and anus: Secondary | ICD-10-CM | POA: Diagnosis not present

## 2020-09-26 DIAGNOSIS — I69322 Dysarthria following cerebral infarction: Secondary | ICD-10-CM | POA: Diagnosis not present

## 2020-09-26 DIAGNOSIS — Z7982 Long term (current) use of aspirin: Secondary | ICD-10-CM | POA: Diagnosis not present

## 2020-09-26 DIAGNOSIS — I69398 Other sequelae of cerebral infarction: Secondary | ICD-10-CM | POA: Diagnosis not present

## 2020-09-26 DIAGNOSIS — Z7984 Long term (current) use of oral hypoglycemic drugs: Secondary | ICD-10-CM | POA: Diagnosis not present

## 2020-09-26 DIAGNOSIS — Z791 Long term (current) use of non-steroidal anti-inflammatories (NSAID): Secondary | ICD-10-CM | POA: Diagnosis not present

## 2020-09-26 DIAGNOSIS — Z9049 Acquired absence of other specified parts of digestive tract: Secondary | ICD-10-CM | POA: Diagnosis not present

## 2020-10-08 DIAGNOSIS — Z791 Long term (current) use of non-steroidal anti-inflammatories (NSAID): Secondary | ICD-10-CM | POA: Diagnosis not present

## 2020-10-08 DIAGNOSIS — I69354 Hemiplegia and hemiparesis following cerebral infarction affecting left non-dominant side: Secondary | ICD-10-CM | POA: Diagnosis not present

## 2020-10-08 DIAGNOSIS — Z86011 Personal history of benign neoplasm of the brain: Secondary | ICD-10-CM | POA: Diagnosis not present

## 2020-10-08 DIAGNOSIS — I69321 Dysphasia following cerebral infarction: Secondary | ICD-10-CM | POA: Diagnosis not present

## 2020-10-08 DIAGNOSIS — Z7982 Long term (current) use of aspirin: Secondary | ICD-10-CM | POA: Diagnosis not present

## 2020-10-08 DIAGNOSIS — I69398 Other sequelae of cerebral infarction: Secondary | ICD-10-CM | POA: Diagnosis not present

## 2020-10-08 DIAGNOSIS — Z8616 Personal history of COVID-19: Secondary | ICD-10-CM | POA: Diagnosis not present

## 2020-10-08 DIAGNOSIS — I10 Essential (primary) hypertension: Secondary | ICD-10-CM | POA: Diagnosis not present

## 2020-10-08 DIAGNOSIS — Z9049 Acquired absence of other specified parts of digestive tract: Secondary | ICD-10-CM | POA: Diagnosis not present

## 2020-10-08 DIAGNOSIS — I69322 Dysarthria following cerebral infarction: Secondary | ICD-10-CM | POA: Diagnosis not present

## 2020-10-08 DIAGNOSIS — S92402D Displaced unspecified fracture of left great toe, subsequent encounter for fracture with routine healing: Secondary | ICD-10-CM | POA: Diagnosis not present

## 2020-10-08 DIAGNOSIS — R569 Unspecified convulsions: Secondary | ICD-10-CM | POA: Diagnosis not present

## 2020-10-08 DIAGNOSIS — Z96642 Presence of left artificial hip joint: Secondary | ICD-10-CM | POA: Diagnosis not present

## 2020-10-08 DIAGNOSIS — Z7984 Long term (current) use of oral hypoglycemic drugs: Secondary | ICD-10-CM | POA: Diagnosis not present

## 2020-10-08 DIAGNOSIS — Z85048 Personal history of other malignant neoplasm of rectum, rectosigmoid junction, and anus: Secondary | ICD-10-CM | POA: Diagnosis not present

## 2020-10-08 DIAGNOSIS — E119 Type 2 diabetes mellitus without complications: Secondary | ICD-10-CM | POA: Diagnosis not present

## 2020-10-14 ENCOUNTER — Other Ambulatory Visit: Payer: Self-pay | Admitting: Internal Medicine

## 2020-10-14 DIAGNOSIS — M1A09X0 Idiopathic chronic gout, multiple sites, without tophus (tophi): Secondary | ICD-10-CM

## 2020-10-14 NOTE — Telephone Encounter (Signed)
Requested medication (s) are due for refill today: yes  Requested medication (s) are on the active medication list: yes  Last refill:  07/23/20 #90  Future visit scheduled: yes  Notes to clinic:  pt has appt tomorrow- sending back to office   Requested Prescriptions  Pending Prescriptions Disp Refills   indomethacin (INDOCIN) 50 MG capsule [Pharmacy Med Name: INDOMETHACIN 50 MG CAPSULE] 90 capsule 0    Sig: TAKE 1 CAPSULE (50 MG TOTAL) BY MOUTH 2 (TWO) TIMES DAILY WITH A MEAL.     Analgesics:  NSAIDS Failed - 10/14/2020 10:06 AM      Failed - HGB in normal range and within 360 days    Hemoglobin  Date Value Ref Range Status  06/07/2020 11.8 (L) 13.0 - 17.7 g/dL Final   HGB  Date Value Ref Range Status  08/02/2008 14.2 13.0 - 17.1 g/dL Final          Passed - Cr in normal range and within 360 days    Creatinine, Ser  Date Value Ref Range Status  06/07/2020 1.21 0.76 - 1.27 mg/dL Final          Passed - Patient is not pregnant      Passed - Valid encounter within last 12 months    Recent Outpatient Visits           4 months ago Controlled type 2 diabetes mellitus with other circulatory complication, without long-term current use of insulin ([place])   [place] Ladell Pier, MD   8 months ago Essential hypertension   [place], Deborah B, MD   1 year ago Type 2 diabetes mellitus with vascular disease [facility])   [place], Deborah B, MD   1 year ago Type 2 diabetes mellitus with vascular disease [facility])   [place], MD   1 year ago Weakness of both legs   [place], [place], MD       Future Appointments             Tomorrow Ladell Pier, MD [place]   In 1 month Wynetta Emery, Dalbert Batman, MD [place]

## 2020-10-15 ENCOUNTER — Encounter: Payer: Self-pay | Admitting: Internal Medicine

## 2020-10-15 ENCOUNTER — Other Ambulatory Visit: Payer: Self-pay

## 2020-10-15 ENCOUNTER — Ambulatory Visit: Payer: BC Managed Care – PPO | Attending: Internal Medicine | Admitting: Internal Medicine

## 2020-10-15 VITALS — BP 125/79 | HR 77 | Resp 16

## 2020-10-15 DIAGNOSIS — Z23 Encounter for immunization: Secondary | ICD-10-CM

## 2020-10-15 DIAGNOSIS — Z9181 History of falling: Secondary | ICD-10-CM

## 2020-10-15 DIAGNOSIS — Z85048 Personal history of other malignant neoplasm of rectum, rectosigmoid junction, and anus: Secondary | ICD-10-CM

## 2020-10-15 DIAGNOSIS — E1159 Type 2 diabetes mellitus with other circulatory complications: Secondary | ICD-10-CM

## 2020-10-15 DIAGNOSIS — S62620A Displaced fracture of medial phalanx of right index finger, initial encounter for closed fracture: Secondary | ICD-10-CM | POA: Diagnosis not present

## 2020-10-15 DIAGNOSIS — D509 Iron deficiency anemia, unspecified: Secondary | ICD-10-CM

## 2020-10-15 DIAGNOSIS — I152 Hypertension secondary to endocrine disorders: Secondary | ICD-10-CM

## 2020-10-15 LAB — POCT GLYCOSYLATED HEMOGLOBIN (HGB A1C): HbA1c, POC (controlled diabetic range): 6.2 % (ref 0.0–7.0)

## 2020-10-15 LAB — GLUCOSE, POCT (MANUAL RESULT ENTRY): POC Glucose: 175 mg/dl — AB (ref 70–99)

## 2020-10-15 NOTE — Progress Notes (Signed)
Patient ID: [name], male    DOB: [birth year]  MRN: [id number]  CC: Diabetes and Hypertension   Subjective: [name] is a 55 y.o. male who presents for chronic ds management.  Last seen 05/2020.  Wife [name] is with him His concerns today include:  Pt with hx of HTN, DM, rectal CA, chronic Ca+ brain mass, sz, brain stem CVA 11/2016 with residual LT spastic hemiparesis and dysarthria  Fx RT index finger the early part of last mth.  Pt reports he either hit it against the door or jammed it in the door.  Wife is not sure how he injured it because she was not there at that time.  She took him to urgent care after he started complaining about pain in the finger.  X-ray revealed soft tissue swelling with possible nondisplaced fracture of the medial aspect of the head of the middle phalanx.  Finger was buddy tape to middle finger.  Wife subsequently purchased a splint for it which she has been wearing daily.  He has not seen in orthopedics.  States that the finger is a little swollen.  Home P.T last session was last wk.  Wife said they told them that they may return in couple of months. Pt feels sessions were helpful.  However, wife reports pt is not practicing the exercises b/w sessions and therefore not really getting the full benefit of it [place] 2 days ago x 2 both with attempted transfers from bed to bedside commode and from [facility] to commode.  Pt has [employer] and was able to push it. EMS came to help him up off the floor as wife is unable to lift him.  Wife said both times he said RT leg weak. Pt is alone during the day.  Both daughter and wife are gone during the day. Wife wants him to be independent of WC 100% some day where he would be able to just use a cane safely.  -wife feels he may have mild depression and not wanting to do anything.  Pt states he feels practicing the exercises will be good but does not endorse feeling depress  Pt reads news paper daily to keep mind  active.  DM: Lab Results  Component Value Date   HGBA1C 6.2 10/15/2020  Started on Metformin on last visit.  Tolerating Metformin.  Wife states he does not avoid sugary things.  Wife encourages him to drink more water but pt resists. HTN:  compliant with meds and salt restriction.  No CP/SOB Taking and tolerating Lipitor.  Not taking ASA daily  Anemia: pt informed that anemia had worsened based on lab test done last visit No fatigue No blood in stools. Has upcoming appt with GI Dr. Ardis Hughs 11/14/20 but wife states they may have to reschedule.  She plans to go with him to all of his doctor's appt moving forward.  Based on her schedule, they may have to move date back with Dr. Ardis Hughs.  HM:  due flu shot.  Had COVID-19 vaccines 2 shot. No booster as yet.    Patient Active Problem List   Diagnosis Date Noted   Pain due to onychomycosis of toenails of both feet 09/21/2020   Edema of both legs 09/29/2019   Meningioma ([place]) 04/13/2019   Hemiparesis and speech and language deficit as late effects of stroke ([place]) 03/31/2018   Gait abnormality 03/31/2018   Spastic hemiplegia affecting left nondominant side ([place]) 12/18/2017   Subacromial impingement of left shoulder 06/09/2017  Spastic neurogenic bladder 06/09/2017   Dysarthria as late effect of stroke 04/01/2017   Neurogenic bladder due to old stroke 12/25/2016   Right pontine cerebrovascular accident ([place]) 12/18/2016   Left hemiparesis ([place])    Diastolic dysfunction    Dysphagia, post-stroke    Acute ischemic stroke ([place]) 12/12/2016   Seizures ([place]) 07/22/2014   Hypertension 07/22/2014   Type 2 diabetes mellitus with vascular disease ([place]) 07/22/2014   Brain mass    Malignant neoplasm of rectum ([place]) 09/02/2007     Current Outpatient Medications on File Prior to Visit  Medication Sig Dispense Refill   amLODipine (NORVASC) 10 MG tablet TAKE 1 TABLET BY MOUTH EVERY DAY 90 tablet 1   aspirin 325 MG tablet Take 1 tablet (325 mg total)  by mouth daily. 30 tablet 0   atorvastatin (LIPITOR) 80 MG tablet TAKE 1 TABLET BY MOUTH EVERY DAY 90 tablet 1   carvedilol (COREG) 25 MG tablet Take 1 tablet (25 mg total) by mouth 2 (two) times daily with a meal. 180 tablet 1   indomethacin (INDOCIN) 50 MG capsule TAKE 1 CAPSULE (50 MG TOTAL) BY MOUTH 2 (TWO) TIMES DAILY WITH A MEAL. 90 capsule 0   Melatonin 3 MG TABS Take 3 tablets (9 mg total) by mouth at bedtime. (Patient not taking: Reported on 10/15/2020) 30 tablet 0   metFORMIN (GLUCOPHAGE) 500 MG tablet Take 1 tablet (500 mg total) by mouth daily with breakfast. 90 tablet 1   olmesartan (BENICAR) 20 MG tablet Take 1 tablet (20 mg total) by mouth daily. 90 tablet 1   oxybutynin (DITROPAN) 5 MG tablet TAKE 1 TABLET BY MOUTH TWICE A DAY 180 tablet 1   No current facility-administered medications on file prior to visit.    No Known Allergies  Social History   Socioeconomic History   Marital status: Married    Spouse name: [name]   Number of children: 1   Years of education: 16   Highest education level: Not on file  Occupational History   Occupation: disabled  Tobacco Use   Smoking status: Never   Smokeless tobacco: Never  Vaping Use   Vaping Use: Never used  Substance and Sexual Activity   Alcohol use: Not Currently   Drug use: No   Sexual activity: Not Currently  Other Topics Concern   Not on file  Social History Narrative   Patient drinks caffeine a couple times a week.   Patient is right handed.   Lives with wife [name]) and daughter   Social Determinants of Health   Financial Resource Strain: Not on file  Food Insecurity: Not on file  Transportation Needs: Not on file  Physical Activity: Not on file  Stress: Not on file  Social Connections: Not on file  Intimate Partner Violence: Not on file    Family History  Problem Relation Age of Onset   Hypertension Mother    Heart disease Mother    Hyperlipidemia Mother    Heart failure Mother     Hypertension Sister    Hypertension Brother    Stroke Father    Hypertension Father    Colon cancer Maternal Grandmother    Diabetes Maternal Grandmother    Esophageal cancer Neg Hx     Past Surgical History:  Procedure Laterality Date   APPENDECTOMY     JOINT REPLACEMENT Left 2004   Left hip   TOTAL HIP ARTHROPLASTY      ROS: Review of Systems Negative except as stated above  PHYSICAL EXAM: BP 125/79   Pulse 77   Resp 16   SpO2 97%   Physical Exam   General appearance -patient sitting in wheelchair in no acute distress. Mental status -patient with flat affect.  Speech is slurred which is his baseline Mouth - mucous membranes moist, pharynx normal without lesions Chest - clear to auscultation, no wheezes, rales or rhonchi, symmetric air entry Heart - normal rate, regular rhythm, normal S1, S2, no murmurs, rubs, clicks or gallops Musculoskeletal -right index finger: Splint was removed.  till has soft tissue edema that is mild to moderate.  Some hyperpigmentation noted over the dorsal DIP joint.  No tenderness on palpation over the PIP DIP and MCP joints.  He is able to bend the finger. Extremities -no lower extremity edema.  Depression screen [facility] 2/9 10/15/2020 07/25/2020 09/29/2019  Decreased Interest 0 0 0  Down, Depressed, Hopeless 0 0 0  PHQ - 2 Score 0 0 0  Altered sleeping - - -  Tired, decreased energy - - -  Change in appetite - - -  Feeling bad or failure about yourself  - - -  Trouble concentrating - - -  Moving slowly or fidgety/restless - - -  Suicidal thoughts - - -  PHQ-9 Score - - -    CMP Latest Ref Rng & Units 06/07/2020 09/29/2019 04/21/2019  Glucose 65 - 99 mg/dL 136(H) - 144(H)  BUN 6 - 24 mg/dL 17 - 14  Creatinine 0.76 - 1.27 mg/dL 1.21 - 1.36(H)  Sodium 134 - 144 mmol/L 145(H) - 143  Potassium 3.5 - 5.2 mmol/L 4.7 - 3.9  Chloride 96 - 106 mmol/L 108(H) - 108  CO2 20 - 29 mmol/L 23 - 24  Calcium 8.7 - 10.2 mg/dL 9.5 - 9.1  Total Protein 6.0 -  8.5 g/dL 7.4 7.6 -  Total Bilirubin 0.0 - 1.2 mg/dL 0.4 0.4 -  Alkaline Phos 44 - 121 IU/L 107 68 -  AST 0 - 40 IU/L 15 15 -  ALT 0 - 44 IU/L 20 7 -   Lipid Panel     Component Value Date/Time   CHOL 141 06/07/2020 1019   TRIG 75 06/07/2020 1019   HDL 44 06/07/2020 1019   CHOLHDL 3.2 06/07/2020 1019   CHOLHDL 3.9 12/13/2016 0421   VLDL 15 12/13/2016 0421   LDLCALC 82 06/07/2020 1019    CBC    Component Value Date/Time   WBC 4.3 06/07/2020 1019   WBC 4.4 04/21/2019 1049   RBC 4.68 06/07/2020 1019   RBC 4.50 04/21/2019 1049   HGB 11.8 (L) 06/07/2020 1019   HGB 14.2 08/02/2008 1026   HCT 36.6 (L) 06/07/2020 1019   HCT 41.6 08/02/2008 1026   PLT 180 06/07/2020 1019   MCV 78 (L) 06/07/2020 1019   MCV 78.4 (L) 08/02/2008 1026   MCH 25.2 (L) 06/07/2020 1019   MCH 25.8 (L) 04/21/2019 1049   MCHC 32.2 06/07/2020 1019   MCHC 32.0 04/21/2019 1049   RDW 15.6 (H) 06/07/2020 1019   RDW 16.3 (H) 08/02/2008 1026   LYMPHSABS 1.3 11/11/2018 1400   LYMPHSABS 0.6 (L) 08/02/2008 1026   MONOABS 0.3 01/01/2017 0624   MONOABS 0.2 08/02/2008 1026   EOSABS 0.1 11/11/2018 1400   BASOSABS 0.0 11/11/2018 1400   BASOSABS 0.0 08/02/2008 1026    ASSESSMENT AND PLAN: 1. Controlled type 2 diabetes mellitus with other circulatory complication, without long-term current use of insulin (HCC) Continue metformin. Dietary counseling given.  Advised  to eliminate sugary drinks from the diet and drink more water instead. - POCT glucose (manual entry) - POCT glycosylated hemoglobin (Hb A1C)  2. History of recent fall Discussed with patient whether he feels he would benefit from some more physical therapy.  Patient wants to hold off for about 2 months.  States he plans to try practice some of the exercises that he was doing with the physical therapist.  He declines an aide at this time. -Given his history of stroke, I recommend that they restart baby aspirin daily.  3. Closed displaced fracture of  middle phalanx of right index finger, initial encounter Advised that it can take 6 to 8 weeks for fracture to heal.  Advised him using the splint for 1 more month.  I also advised and showed him how to wear the splint.  I think some of the soft tissue swelling was due to the fact that he had the wrap around it too tight.  4. Hypertension associated with diabetes ([place]) At goal.  Continue current medications and low-salt diet  5. History of rectal cancer 6. Microcytic anemia Strongly advised that they keep the appointment with the gastroenterologist and if they are unable to do so, call well in advance to reschedule.  Informed the wife of the worsening anemia and the need for this to be evaluated further given his history of rectal cancer. - CBC  7. Need for influenza vaccination given    Patient was given the opportunity to ask questions.  Patient verbalized understanding of the plan and was able to repeat key elements of the plan.   Orders Placed This Encounter  Procedures   POCT glucose (manual entry)   POCT glycosylated hemoglobin (Hb A1C)     Requested Prescriptions    No prescriptions requested or ordered in this encounter    No follow-ups on file.  Karle Plumber, MD, FACP

## 2020-10-15 NOTE — Patient Instructions (Addendum)
Please try to keep the appointment with the gastroenterologist Dr. Ardis Hughs please.  If you do have to reschedule please call well in advance to do so.  I recommend taking a baby aspirin daily given his history of stroke.  Try to cut back on eating sweet foods/drinks.  Let me know when you are ready to do some more home physical therapy.

## 2020-10-16 ENCOUNTER — Telehealth: Payer: Self-pay | Admitting: Internal Medicine

## 2020-10-16 LAB — CBC
Hematocrit: 31.8 % — ABNORMAL LOW (ref 37.5–51.0)
Hemoglobin: 10.6 g/dL — ABNORMAL LOW (ref 13.0–17.7)
MCH: 25.9 pg — ABNORMAL LOW (ref 26.6–33.0)
MCHC: 33.3 g/dL (ref 31.5–35.7)
MCV: 78 fL — ABNORMAL LOW (ref 79–97)
Platelets: 211 10*3/uL (ref 150–450)
RBC: 4.09 x10E6/uL — ABNORMAL LOW (ref 4.14–5.80)
RDW: 13.6 % (ref 11.6–15.4)
WBC: 4.4 10*3/uL (ref 3.4–10.8)

## 2020-10-16 NOTE — Telephone Encounter (Signed)
Phone call placed to patient and his wife this morning.  I informed them both that patient's anemia is a little worse compared to what it was when we checked it in May.  Based on this results, I strongly advised that they keep the appointment with Dr. Ardis Hughs next month for his colonoscopy.  I will recheck a ferritin level.  Patient advised to discontinue the indomethacin and use Tylenol as needed instead for pain.  They both expressed understanding.

## 2020-10-16 NOTE — Addendum Note (Signed)
Addended by: Karle Plumber B on: 10/16/2020 07:59 AM   Modules accepted: Orders

## 2020-10-25 ENCOUNTER — Ambulatory Visit: Payer: Self-pay | Admitting: *Deleted

## 2020-10-25 NOTE — Telephone Encounter (Signed)
Summary: pain in right elbow   Patients wife called in says patient experiencing pain in right arm. She called from work so asking if patient can be called on their cell phone [phone]       Pt reports right arm pain, onset 2 days ago. States right arm from elbow through fingers. Rates at 8/10, constant. Has taken tylenol, ineffective. Does not recall any injury. Did sustain non displaced FX right index finger 09/25/20, "Hit it on a door." Denies any falls. Denies swelling, no redness or warmth. Advised UC. States will follow disposition.      Reason for Disposition  [1] SEVERE pain AND [2] not improved 2 hours after pain medicine  Answer Assessment - Initial Assessment Questions 1. ONSET: "When did the pain start?"     2 days ago 2. LOCATION: "Where is the pain located?"     Right elbow on down to fingers 3. PAIN: "How bad is the pain?" (Scale 1-10; or mild, moderate, severe)   - MILD (1-3): doesn't interfere with normal activities   - MODERATE (4-7): interferes with normal activities (e.g., work or school) or awakens from sleep   - SEVERE (8-10): excruciating pain, unable to do any normal activities, unable to hold a cup of water     8/10 4. WORK OR EXERCISE: "Has there been any recent work or exercise that involved this part of the body?"     no 5. CAUSE: "What do you think is causing the arm pain?"     no 6. OTHER SYMPTOMS: "Do you have any other symptoms?" (e.g., neck pain, swelling, rash, fever, numbness, weakness)     no  Protocols used: Arm Pain-A-AH

## 2020-10-25 NOTE — Telephone Encounter (Signed)
Patient advised to try warm compress over elbow discomfort that took place on Tuesday.  He was advise to try ibuprofen and tylenol as needed for pain. States he is able to extend and bend arm.   Schedule appt with Dr. Wynetta Emery via [place] for Friday a.m.    Patient verbalized understanding.

## 2020-10-26 ENCOUNTER — Ambulatory Visit: Payer: BC Managed Care – PPO | Attending: Internal Medicine | Admitting: Internal Medicine

## 2020-10-26 ENCOUNTER — Other Ambulatory Visit: Payer: Self-pay

## 2020-10-26 DIAGNOSIS — M79601 Pain in right arm: Secondary | ICD-10-CM | POA: Diagnosis not present

## 2020-10-26 MED ORDER — DICLOFENAC SODIUM 1 % EX GEL: 2.0000 g | Freq: Four times a day (QID) | CUTANEOUS | 0 refills | Status: DC

## 2020-10-26 MED ORDER — ACETAMINOPHEN-CODEINE #3 300-30 MG PO TABS: 1.0000 | ORAL_TABLET | Freq: Three times a day (TID) | ORAL | 0 refills | Status: DC | PRN

## 2020-10-26 NOTE — Progress Notes (Signed)
Virtual Visit via Telephone Note Patient initially called at 8:12 AM.  There was no answer. I connected with [name] on 10/26/2020 at 8:40 a.m by telephone and verified that I am speaking with the correct person using two identifiers  Location: Patient: home Provider: office  Participants: Myself Patient   I discussed the limitations, risks, security and privacy concerns of performing an evaluation and management service by telephone and the availability of in person appointments. I also discussed with the patient that there may be a patient responsible charge related to this service. The patient expressed understanding and agreed to proceed.   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  This is an urgent care visit.  Patient complains of pain in the right arm x2 days.  The pain is from the right elbow down to his fingers.  He denies any initiating factors.  He has not had any falls since he last saw me.  He denies any redness, swelling or coolness to the arm.  He has not noticed any skin changes.  He rates pain as 8/10 and constant.  He has been taking Tylenol every 4 hours without relief.  I asked patient whether his wife was present and he told me that she had already left for work.   Outpatient Encounter Medications as of 10/26/2020  Medication Sig Note   amLODipine (NORVASC) 10 MG tablet TAKE 1 TABLET BY MOUTH EVERY DAY    aspirin 325 MG tablet Take 1 tablet (325 mg total) by mouth daily.    atorvastatin (LIPITOR) 80 MG tablet TAKE 1 TABLET BY MOUTH EVERY DAY    carvedilol (COREG) 25 MG tablet Take 1 tablet (25 mg total) by mouth 2 (two) times daily with a meal.    indomethacin (INDOCIN) 50 MG capsule TAKE 1 CAPSULE (50 MG TOTAL) BY MOUTH 2 (TWO) TIMES DAILY WITH A MEAL. 07/25/2020: prn   Melatonin 3 MG TABS Take 3 tablets (9 mg total) by mouth at  bedtime. (Patient not taking: Reported on 10/15/2020)    metFORMIN (GLUCOPHAGE) 500 MG tablet Take 1 tablet (500 mg total) by mouth daily with breakfast.    olmesartan (BENICAR) 20 MG tablet Take 1 tablet (20 mg total) by mouth daily.    oxybutynin (DITROPAN) 5 MG tablet TAKE 1 TABLET BY MOUTH TWICE A DAY    No facility-administered encounter medications on file as of 10/26/2020.      Observations/Objective: No direct observation done as this was a telephone visit.  Assessment and Plan: 1. Pain of right upper extremity Advised patient to apply warm compresses to the elbow and forearm.  I will send a prescription for Voltaren gel for him to use along with a limited supply of Tylenol with codeine.  Advised that the Tylenol with codeine can cause drowsiness.  Advised to stop the regular Tylenol while he is taking the Tylenol with codeine.  Patient expressed understanding.  [place] controlled substance reporting system reviewed. -Advised patient that if pain does not improve or starts getting worse, he should be seen at urgent care or the emergency room.  He expressed understanding. - acetaminophen-codeine (TYLENOL #3) 300-30 MG tablet; Take 1 tablet by mouth every 8 (eight) hours as needed for moderate pain.  Dispense: 12 tablet; Refill: 0 - diclofenac Sodium (VOLTAREN) 1 % GEL; Apply 2 g topically 4 (four) times daily.  Dispense:  100 g; Refill: 0   Follow Up Instructions: PRN   I discussed the assessment and treatment plan with the patient. The patient was provided an opportunity to ask questions and all were answered. The patient agreed with the plan and demonstrated an understanding of the instructions.   The patient was advised to call back or seek an in-person evaluation if the symptoms worsen or if the condition fails to improve as anticipated.  I  Spent 9 minutes on this telephone encounter  Karle Plumber, MD

## 2020-11-11 ENCOUNTER — Other Ambulatory Visit: Payer: Self-pay | Admitting: Internal Medicine

## 2020-11-11 ENCOUNTER — Other Ambulatory Visit: Payer: Self-pay | Admitting: Physical Medicine & Rehabilitation

## 2020-11-11 DIAGNOSIS — I10 Essential (primary) hypertension: Secondary | ICD-10-CM

## 2020-11-11 DIAGNOSIS — I152 Hypertension secondary to endocrine disorders: Secondary | ICD-10-CM

## 2020-11-11 DIAGNOSIS — M1A09X0 Idiopathic chronic gout, multiple sites, without tophus (tophi): Secondary | ICD-10-CM

## 2020-11-11 NOTE — Telephone Encounter (Signed)
Requested Prescriptions  Pending Prescriptions Disp Refills  . olmesartan (BENICAR) 20 MG tablet [Pharmacy Med Name: OLMESARTAN MEDOXOMIL 20 MG TAB] 90 tablet 0    Sig: TAKE 1 TABLET BY MOUTH EVERY DAY     Cardiovascular:  Angiotensin Receptor Blockers Passed - 11/11/2020 10:57 AM      Passed - Cr in normal range and within 180 days    Creatinine, Ser  Date Value Ref Range Status  06/07/2020 1.21 0.76 - 1.27 mg/dL Final         Passed - K in normal range and within 180 days    Potassium  Date Value Ref Range Status  06/07/2020 4.7 3.5 - 5.2 mmol/L Final         Passed - Patient is not pregnant      Passed - Last BP in normal range    BP Readings from Last 1 Encounters:  10/15/20 125/79         Passed - Valid encounter within last 6 months    Recent Outpatient Visits          2 weeks ago Pain of right upper extremity   [place], Neoma Laming B, MD   3 weeks ago Controlled type 2 diabetes mellitus with other circulatory complication, without long-term current use of insulin ([place])   [place], Neoma Laming B, MD   5 months ago Controlled type 2 diabetes mellitus with other circulatory complication, without long-term current use of insulin ([place])   [place], Deborah B, MD   9 months ago Essential hypertension   [place], Deborah B, MD   1 year ago Type 2 diabetes mellitus with vascular disease [facility])   [place], MD      Future Appointments            In 3 months Wynetta Emery, Dalbert Batman, MD [place]           . indomethacin (INDOCIN) 50 MG capsule [Pharmacy Med Name: INDOMETHACIN 50 MG CAPSULE] 90 capsule 0    Sig: TAKE 1 CAPSULE (50 MG TOTAL) BY MOUTH 2 (TWO) TIMES DAILY WITH A MEAL.     Analgesics:  NSAIDS Failed - 11/11/2020 10:57 AM       Failed - HGB in normal range and within 360 days    Hemoglobin  Date Value Ref Range Status  10/15/2020 10.6 (L) 13.0 - 17.7 g/dL Final   HGB  Date Value Ref Range Status  08/02/2008 14.2 13.0 - 17.1 g/dL Final         Passed - Cr in normal range and within 360 days    Creatinine, Ser  Date Value Ref Range Status  06/07/2020 1.21 0.76 - 1.27 mg/dL Final         Passed - Patient is not pregnant      Passed - Valid encounter within last 12 months    Recent Outpatient Visits          2 weeks ago Pain of right upper extremity   [place], Neoma Laming B, MD   3 weeks ago Controlled type 2 diabetes mellitus with other circulatory complication, without long-term current use of insulin [facility])   [place] Karle Plumber B, MD   5 months ago Controlled type 2 diabetes  mellitus with other circulatory complication, without long-term current use of insulin [facility])   [place], MD   9 months ago Essential hypertension   [place], MD   1 year ago Type 2 diabetes mellitus with vascular disease [facility])   [place], MD      Future Appointments            In 3 months Wynetta Emery, Dalbert Batman, MD [place]           . metFORMIN (GLUCOPHAGE) 500 MG tablet [Pharmacy Med Name: METFORMIN HCL 500 MG TABLET] 90 tablet 0    Sig: TAKE 1 TABLET BY MOUTH EVERY DAY WITH BREAKFAST     Endocrinology:  Diabetes - Biguanides Failed - 11/11/2020 10:57 AM      Failed - AA eGFR in normal range and within 360 days    GFR calc Af Amer  Date Value Ref Range Status  04/21/2019 >60 >60 mL/min Final   GFR calc non Af Amer  Date Value Ref Range Status  04/21/2019 59 (L) >60 mL/min Final   eGFR  Date Value Ref Range Status  06/07/2020 71 >59 mL/min/1.73 Final          Passed - Cr in normal range and within 360 days    Creatinine, Ser  Date Value Ref Range Status  06/07/2020 1.21 0.76 - 1.27 mg/dL Final         Passed - HBA1C is between 0 and 7.9 and within 180 days    HbA1c, POC (prediabetic range)  Date Value Ref Range Status  09/29/2019 6.2 5.7 - 6.4 % Final   HbA1c, POC (controlled diabetic range)  Date Value Ref Range Status  10/15/2020 6.2 0.0 - 7.0 % Final         Passed - Valid encounter within last 6 months    Recent Outpatient Visits          2 weeks ago Pain of right upper extremity   [place], Neoma Laming B, MD   3 weeks ago Controlled type 2 diabetes mellitus with other circulatory complication, without long-term current use of insulin ([place])   [place], Neoma Laming B, MD   5 months ago Controlled type 2 diabetes mellitus with other circulatory complication, without long-term current use of insulin ([place])   [place], Deborah B, MD   9 months ago Essential hypertension   [place], Deborah B, MD   1 year ago Type 2 diabetes mellitus with vascular disease [facility])   [place], MD      Future Appointments            In 3 months Wynetta Emery, Dalbert Batman, MD [place]           . amLODipine (NORVASC) 10 MG tablet [Pharmacy Med Name: AMLODIPINE BESYLATE 10 MG TAB] 90 tablet 0    Sig: TAKE 1 TABLET BY MOUTH EVERY DAY     Cardiovascular:  Calcium Channel Blockers Passed - 11/11/2020 10:57 AM      Passed - Last BP in normal range    BP Readings from Last 1 Encounters:  10/15/20 125/79         Passed -  Valid encounter within last 6 months    Recent Outpatient Visits          2 weeks ago Pain of right upper extremity   [place] Karle Plumber B, MD   3 weeks ago  Controlled type 2 diabetes mellitus with other circulatory complication, without long-term current use of insulin [facility])   [place] Karle Plumber B, MD   5 months ago Controlled type 2 diabetes mellitus with other circulatory complication, without long-term current use of insulin [facility])   [place], MD   9 months ago Essential hypertension   [place], Deborah B, MD   1 year ago Type 2 diabetes mellitus with vascular disease [facility])   [place], MD      Future Appointments            In 3 months Wynetta Emery, Dalbert Batman, MD [place]

## 2020-11-13 ENCOUNTER — Other Ambulatory Visit: Payer: Self-pay

## 2020-11-13 ENCOUNTER — Encounter (HOSPITAL_BASED_OUTPATIENT_CLINIC_OR_DEPARTMENT_OTHER): Payer: Self-pay

## 2020-11-13 DIAGNOSIS — Z6834 Body mass index (BMI) 34.0-34.9, adult: Secondary | ICD-10-CM | POA: Diagnosis not present

## 2020-11-13 DIAGNOSIS — I63511 Cerebral infarction due to unspecified occlusion or stenosis of right middle cerebral artery: Secondary | ICD-10-CM | POA: Diagnosis not present

## 2020-11-13 DIAGNOSIS — I6930 Unspecified sequelae of cerebral infarction: Secondary | ICD-10-CM | POA: Diagnosis not present

## 2020-11-13 DIAGNOSIS — L89890 Pressure ulcer of other site, unstageable: Secondary | ICD-10-CM | POA: Diagnosis not present

## 2020-11-13 DIAGNOSIS — Z823 Family history of stroke: Secondary | ICD-10-CM

## 2020-11-13 DIAGNOSIS — K2211 Ulcer of esophagus with bleeding: Secondary | ICD-10-CM | POA: Diagnosis not present

## 2020-11-13 DIAGNOSIS — I69322 Dysarthria following cerebral infarction: Secondary | ICD-10-CM | POA: Diagnosis not present

## 2020-11-13 DIAGNOSIS — Z86010 Personal history of colonic polyps: Secondary | ICD-10-CM | POA: Diagnosis not present

## 2020-11-13 DIAGNOSIS — R112 Nausea with vomiting, unspecified: Secondary | ICD-10-CM | POA: Diagnosis not present

## 2020-11-13 DIAGNOSIS — Z800 Family history of malignant neoplasm of digestive organs: Secondary | ICD-10-CM

## 2020-11-13 DIAGNOSIS — Z6841 Body Mass Index (BMI) 40.0 and over, adult: Secondary | ICD-10-CM | POA: Diagnosis not present

## 2020-11-13 DIAGNOSIS — R6510 Systemic inflammatory response syndrome (SIRS) of non-infectious origin without acute organ dysfunction: Secondary | ICD-10-CM | POA: Diagnosis not present

## 2020-11-13 DIAGNOSIS — Z83438 Family history of other disorder of lipoprotein metabolism and other lipidemia: Secondary | ICD-10-CM

## 2020-11-13 DIAGNOSIS — K633 Ulcer of intestine: Secondary | ICD-10-CM | POA: Diagnosis not present

## 2020-11-13 DIAGNOSIS — I5032 Chronic diastolic (congestive) heart failure: Secondary | ICD-10-CM | POA: Diagnosis not present

## 2020-11-13 DIAGNOSIS — I10 Essential (primary) hypertension: Secondary | ICD-10-CM | POA: Diagnosis not present

## 2020-11-13 DIAGNOSIS — Z7982 Long term (current) use of aspirin: Secondary | ICD-10-CM

## 2020-11-13 DIAGNOSIS — I130 Hypertensive heart and chronic kidney disease with heart failure and stage 1 through stage 4 chronic kidney disease, or unspecified chronic kidney disease: Secondary | ICD-10-CM | POA: Diagnosis present

## 2020-11-13 DIAGNOSIS — K5730 Diverticulosis of large intestine without perforation or abscess without bleeding: Secondary | ICD-10-CM | POA: Diagnosis present

## 2020-11-13 DIAGNOSIS — I69354 Hemiplegia and hemiparesis following cerebral infarction affecting left non-dominant side: Secondary | ICD-10-CM

## 2020-11-13 DIAGNOSIS — K5900 Constipation, unspecified: Secondary | ICD-10-CM | POA: Diagnosis not present

## 2020-11-13 DIAGNOSIS — M10261 Drug-induced gout, right knee: Secondary | ICD-10-CM | POA: Diagnosis not present

## 2020-11-13 DIAGNOSIS — T39395A Adverse effect of other nonsteroidal anti-inflammatory drugs [NSAID], initial encounter: Secondary | ICD-10-CM | POA: Diagnosis present

## 2020-11-13 DIAGNOSIS — Z8249 Family history of ischemic heart disease and other diseases of the circulatory system: Secondary | ICD-10-CM | POA: Diagnosis not present

## 2020-11-13 DIAGNOSIS — E876 Hypokalemia: Secondary | ICD-10-CM | POA: Diagnosis not present

## 2020-11-13 DIAGNOSIS — E785 Hyperlipidemia, unspecified: Secondary | ICD-10-CM | POA: Diagnosis present

## 2020-11-13 DIAGNOSIS — Z96642 Presence of left artificial hip joint: Secondary | ICD-10-CM | POA: Diagnosis present

## 2020-11-13 DIAGNOSIS — E871 Hypo-osmolality and hyponatremia: Secondary | ICD-10-CM | POA: Diagnosis not present

## 2020-11-13 DIAGNOSIS — K648 Other hemorrhoids: Secondary | ICD-10-CM | POA: Diagnosis not present

## 2020-11-13 DIAGNOSIS — R5381 Other malaise: Secondary | ICD-10-CM | POA: Diagnosis not present

## 2020-11-13 DIAGNOSIS — Z79899 Other long term (current) drug therapy: Secondary | ICD-10-CM

## 2020-11-13 DIAGNOSIS — I69328 Other speech and language deficits following cerebral infarction: Secondary | ICD-10-CM | POA: Diagnosis not present

## 2020-11-13 DIAGNOSIS — I425 Other restrictive cardiomyopathy: Secondary | ICD-10-CM | POA: Diagnosis not present

## 2020-11-13 DIAGNOSIS — Z85048 Personal history of other malignant neoplasm of rectum, rectosigmoid junction, and anus: Secondary | ICD-10-CM

## 2020-11-13 DIAGNOSIS — Z86018 Personal history of other benign neoplasm: Secondary | ICD-10-CM | POA: Diagnosis not present

## 2020-11-13 DIAGNOSIS — D128 Benign neoplasm of rectum: Secondary | ICD-10-CM | POA: Diagnosis not present

## 2020-11-13 DIAGNOSIS — N189 Chronic kidney disease, unspecified: Secondary | ICD-10-CM | POA: Diagnosis not present

## 2020-11-13 DIAGNOSIS — Z993 Dependence on wheelchair: Secondary | ICD-10-CM

## 2020-11-13 DIAGNOSIS — Z833 Family history of diabetes mellitus: Secondary | ICD-10-CM | POA: Diagnosis not present

## 2020-11-13 DIAGNOSIS — G8114 Spastic hemiplegia affecting left nondominant side: Secondary | ICD-10-CM | POA: Diagnosis not present

## 2020-11-13 DIAGNOSIS — E1122 Type 2 diabetes mellitus with diabetic chronic kidney disease: Secondary | ICD-10-CM | POA: Diagnosis not present

## 2020-11-13 DIAGNOSIS — I69398 Other sequelae of cerebral infarction: Secondary | ICD-10-CM | POA: Diagnosis not present

## 2020-11-13 DIAGNOSIS — I4891 Unspecified atrial fibrillation: Secondary | ICD-10-CM | POA: Diagnosis not present

## 2020-11-13 DIAGNOSIS — Z7984 Long term (current) use of oral hypoglycemic drugs: Secondary | ICD-10-CM

## 2020-11-13 DIAGNOSIS — Z85038 Personal history of other malignant neoplasm of large intestine: Secondary | ICD-10-CM | POA: Diagnosis not present

## 2020-11-13 DIAGNOSIS — L89150 Pressure ulcer of sacral region, unstageable: Secondary | ICD-10-CM | POA: Diagnosis not present

## 2020-11-13 DIAGNOSIS — D62 Acute posthemorrhagic anemia: Secondary | ICD-10-CM | POA: Diagnosis not present

## 2020-11-13 DIAGNOSIS — M1A9XX0 Chronic gout, unspecified, without tophus (tophi): Secondary | ICD-10-CM | POA: Diagnosis present

## 2020-11-13 DIAGNOSIS — K2950 Unspecified chronic gastritis without bleeding: Secondary | ICD-10-CM | POA: Diagnosis present

## 2020-11-13 DIAGNOSIS — R52 Pain, unspecified: Secondary | ICD-10-CM | POA: Diagnosis present

## 2020-11-13 DIAGNOSIS — E1159 Type 2 diabetes mellitus with other circulatory complications: Secondary | ICD-10-CM | POA: Diagnosis not present

## 2020-11-13 DIAGNOSIS — I4819 Other persistent atrial fibrillation: Secondary | ICD-10-CM | POA: Diagnosis not present

## 2020-11-13 DIAGNOSIS — I69351 Hemiplegia and hemiparesis following cerebral infarction affecting right dominant side: Secondary | ICD-10-CM | POA: Diagnosis not present

## 2020-11-13 DIAGNOSIS — Z20822 Contact with and (suspected) exposure to covid-19: Secondary | ICD-10-CM | POA: Diagnosis present

## 2020-11-13 DIAGNOSIS — G40909 Epilepsy, unspecified, not intractable, without status epilepticus: Secondary | ICD-10-CM | POA: Diagnosis present

## 2020-11-13 DIAGNOSIS — K253 Acute gastric ulcer without hemorrhage or perforation: Secondary | ICD-10-CM | POA: Diagnosis not present

## 2020-11-13 DIAGNOSIS — F32A Depression, unspecified: Secondary | ICD-10-CM | POA: Diagnosis not present

## 2020-11-13 DIAGNOSIS — I700 Atherosclerosis of aorta: Secondary | ICD-10-CM | POA: Diagnosis not present

## 2020-11-13 DIAGNOSIS — M109 Gout, unspecified: Secondary | ICD-10-CM | POA: Diagnosis present

## 2020-11-13 DIAGNOSIS — S299XXA Unspecified injury of thorax, initial encounter: Secondary | ICD-10-CM | POA: Diagnosis not present

## 2020-11-13 DIAGNOSIS — E669 Obesity, unspecified: Secondary | ICD-10-CM | POA: Diagnosis not present

## 2020-11-13 DIAGNOSIS — E1165 Type 2 diabetes mellitus with hyperglycemia: Secondary | ICD-10-CM | POA: Diagnosis not present

## 2020-11-13 DIAGNOSIS — D329 Benign neoplasm of meninges, unspecified: Secondary | ICD-10-CM | POA: Diagnosis not present

## 2020-11-13 DIAGNOSIS — Z923 Personal history of irradiation: Secondary | ICD-10-CM

## 2020-11-13 DIAGNOSIS — N182 Chronic kidney disease, stage 2 (mild): Secondary | ICD-10-CM | POA: Diagnosis present

## 2020-11-13 DIAGNOSIS — K621 Rectal polyp: Secondary | ICD-10-CM | POA: Diagnosis not present

## 2020-11-13 DIAGNOSIS — G4700 Insomnia, unspecified: Secondary | ICD-10-CM | POA: Diagnosis not present

## 2020-11-13 DIAGNOSIS — Z86011 Personal history of benign neoplasm of the brain: Secondary | ICD-10-CM

## 2020-11-13 DIAGNOSIS — I5189 Other ill-defined heart diseases: Secondary | ICD-10-CM | POA: Diagnosis not present

## 2020-11-13 DIAGNOSIS — I480 Paroxysmal atrial fibrillation: Secondary | ICD-10-CM | POA: Diagnosis not present

## 2020-11-13 DIAGNOSIS — E875 Hyperkalemia: Secondary | ICD-10-CM | POA: Diagnosis not present

## 2020-11-13 DIAGNOSIS — I69320 Aphasia following cerebral infarction: Secondary | ICD-10-CM | POA: Diagnosis not present

## 2020-11-13 DIAGNOSIS — K8020 Calculus of gallbladder without cholecystitis without obstruction: Secondary | ICD-10-CM | POA: Diagnosis not present

## 2020-11-13 DIAGNOSIS — Z9221 Personal history of antineoplastic chemotherapy: Secondary | ICD-10-CM

## 2020-11-13 DIAGNOSIS — K2210 Ulcer of esophagus without bleeding: Secondary | ICD-10-CM | POA: Diagnosis not present

## 2020-11-13 DIAGNOSIS — E1169 Type 2 diabetes mellitus with other specified complication: Secondary | ICD-10-CM | POA: Diagnosis not present

## 2020-11-13 DIAGNOSIS — D509 Iron deficiency anemia, unspecified: Secondary | ICD-10-CM | POA: Diagnosis present

## 2020-11-13 DIAGNOSIS — N289 Disorder of kidney and ureter, unspecified: Secondary | ICD-10-CM | POA: Diagnosis not present

## 2020-11-13 DIAGNOSIS — R2689 Other abnormalities of gait and mobility: Secondary | ICD-10-CM | POA: Diagnosis present

## 2020-11-13 DIAGNOSIS — M10061 Idiopathic gout, right knee: Secondary | ICD-10-CM | POA: Diagnosis not present

## 2020-11-13 DIAGNOSIS — R1032 Left lower quadrant pain: Secondary | ICD-10-CM | POA: Diagnosis not present

## 2020-11-13 DIAGNOSIS — N179 Acute kidney failure, unspecified: Secondary | ICD-10-CM | POA: Diagnosis present

## 2020-11-13 DIAGNOSIS — I110 Hypertensive heart disease with heart failure: Secondary | ICD-10-CM | POA: Diagnosis not present

## 2020-11-13 DIAGNOSIS — I959 Hypotension, unspecified: Secondary | ICD-10-CM | POA: Diagnosis present

## 2020-11-13 DIAGNOSIS — K209 Esophagitis, unspecified without bleeding: Secondary | ICD-10-CM | POA: Diagnosis not present

## 2020-11-13 DIAGNOSIS — K3189 Other diseases of stomach and duodenum: Secondary | ICD-10-CM | POA: Diagnosis not present

## 2020-11-13 DIAGNOSIS — I422 Other hypertrophic cardiomyopathy: Secondary | ICD-10-CM | POA: Diagnosis not present

## 2020-11-13 NOTE — ED Triage Notes (Signed)
Patient BIB [facility] from Home for Left Sided Torso Pain.  Patient fell yesterday PM. Patient states he has a History of Gout resulting in Right Knee Pain that caused him to Fall. [facility] responded at that time and assisted him from Floor to Bed. Patient awoke this AM however with Left-Sided Torso Pain.  History of CVA (2018) resulting in Left Sided Deficits. NAD Noted during Triage. A&Ox4. GCS 15. Wheelchair Bound.

## 2020-11-14 ENCOUNTER — Ambulatory Visit: Payer: BC Managed Care – PPO | Admitting: Gastroenterology

## 2020-11-14 ENCOUNTER — Emergency Department (HOSPITAL_BASED_OUTPATIENT_CLINIC_OR_DEPARTMENT_OTHER): Payer: BC Managed Care – PPO

## 2020-11-14 ENCOUNTER — Observation Stay (HOSPITAL_COMMUNITY): Payer: BC Managed Care – PPO

## 2020-11-14 ENCOUNTER — Inpatient Hospital Stay (HOSPITAL_BASED_OUTPATIENT_CLINIC_OR_DEPARTMENT_OTHER)
Admission: EM | Admit: 2020-11-14 | Discharge: 2020-11-23 | DRG: 308 | Disposition: A | Discharge status: Rehabilitation Facility | Payer: BC Managed Care – PPO | Attending: Family Medicine | Admitting: Family Medicine

## 2020-11-14 DIAGNOSIS — M109 Gout, unspecified: Secondary | ICD-10-CM | POA: Diagnosis not present

## 2020-11-14 DIAGNOSIS — W19XXXA Unspecified fall, initial encounter: Secondary | ICD-10-CM

## 2020-11-14 DIAGNOSIS — Z6834 Body mass index (BMI) 34.0-34.9, adult: Secondary | ICD-10-CM | POA: Diagnosis not present

## 2020-11-14 DIAGNOSIS — G40909 Epilepsy, unspecified, not intractable, without status epilepticus: Secondary | ICD-10-CM | POA: Diagnosis present

## 2020-11-14 DIAGNOSIS — N179 Acute kidney failure, unspecified: Secondary | ICD-10-CM | POA: Diagnosis present

## 2020-11-14 DIAGNOSIS — F32A Depression, unspecified: Secondary | ICD-10-CM | POA: Diagnosis not present

## 2020-11-14 DIAGNOSIS — E875 Hyperkalemia: Secondary | ICD-10-CM | POA: Diagnosis not present

## 2020-11-14 DIAGNOSIS — K5900 Constipation, unspecified: Secondary | ICD-10-CM | POA: Diagnosis not present

## 2020-11-14 DIAGNOSIS — E871 Hypo-osmolality and hyponatremia: Secondary | ICD-10-CM | POA: Diagnosis not present

## 2020-11-14 DIAGNOSIS — D329 Benign neoplasm of meninges, unspecified: Secondary | ICD-10-CM

## 2020-11-14 DIAGNOSIS — K2211 Ulcer of esophagus with bleeding: Secondary | ICD-10-CM | POA: Diagnosis present

## 2020-11-14 DIAGNOSIS — I5189 Other ill-defined heart diseases: Secondary | ICD-10-CM | POA: Diagnosis not present

## 2020-11-14 DIAGNOSIS — I110 Hypertensive heart disease with heart failure: Secondary | ICD-10-CM | POA: Diagnosis present

## 2020-11-14 DIAGNOSIS — R140 Abdominal distension (gaseous): Secondary | ICD-10-CM

## 2020-11-14 DIAGNOSIS — I69320 Aphasia following cerebral infarction: Secondary | ICD-10-CM | POA: Diagnosis not present

## 2020-11-14 DIAGNOSIS — R5381 Other malaise: Secondary | ICD-10-CM | POA: Diagnosis present

## 2020-11-14 DIAGNOSIS — E669 Obesity, unspecified: Secondary | ICD-10-CM

## 2020-11-14 DIAGNOSIS — E1122 Type 2 diabetes mellitus with diabetic chronic kidney disease: Secondary | ICD-10-CM | POA: Diagnosis present

## 2020-11-14 DIAGNOSIS — I5032 Chronic diastolic (congestive) heart failure: Secondary | ICD-10-CM | POA: Diagnosis present

## 2020-11-14 DIAGNOSIS — L89150 Pressure ulcer of sacral region, unstageable: Secondary | ICD-10-CM | POA: Diagnosis present

## 2020-11-14 DIAGNOSIS — I6930 Unspecified sequelae of cerebral infarction: Secondary | ICD-10-CM

## 2020-11-14 DIAGNOSIS — I10 Essential (primary) hypertension: Secondary | ICD-10-CM | POA: Diagnosis not present

## 2020-11-14 DIAGNOSIS — R112 Nausea with vomiting, unspecified: Secondary | ICD-10-CM | POA: Diagnosis not present

## 2020-11-14 DIAGNOSIS — E1159 Type 2 diabetes mellitus with other circulatory complications: Secondary | ICD-10-CM | POA: Diagnosis present

## 2020-11-14 DIAGNOSIS — Z86018 Personal history of other benign neoplasm: Secondary | ICD-10-CM | POA: Diagnosis not present

## 2020-11-14 DIAGNOSIS — Z96642 Presence of left artificial hip joint: Secondary | ICD-10-CM | POA: Diagnosis present

## 2020-11-14 DIAGNOSIS — E66811 Obesity, class 1: Secondary | ICD-10-CM | POA: Diagnosis present

## 2020-11-14 DIAGNOSIS — K253 Acute gastric ulcer without hemorrhage or perforation: Secondary | ICD-10-CM | POA: Diagnosis not present

## 2020-11-14 DIAGNOSIS — Z860100 Personal history of colon polyps, unspecified: Secondary | ICD-10-CM

## 2020-11-14 DIAGNOSIS — Z833 Family history of diabetes mellitus: Secondary | ICD-10-CM | POA: Diagnosis not present

## 2020-11-14 DIAGNOSIS — K633 Ulcer of intestine: Secondary | ICD-10-CM | POA: Diagnosis present

## 2020-11-14 DIAGNOSIS — R52 Pain, unspecified: Secondary | ICD-10-CM | POA: Diagnosis present

## 2020-11-14 DIAGNOSIS — I69328 Other speech and language deficits following cerebral infarction: Secondary | ICD-10-CM | POA: Diagnosis not present

## 2020-11-14 DIAGNOSIS — Z20822 Contact with and (suspected) exposure to covid-19: Secondary | ICD-10-CM | POA: Diagnosis present

## 2020-11-14 DIAGNOSIS — G8114 Spastic hemiplegia affecting left nondominant side: Secondary | ICD-10-CM | POA: Diagnosis not present

## 2020-11-14 DIAGNOSIS — S301XXA Contusion of abdominal wall, initial encounter: Secondary | ICD-10-CM

## 2020-11-14 DIAGNOSIS — I4891 Unspecified atrial fibrillation: Secondary | ICD-10-CM | POA: Diagnosis not present

## 2020-11-14 DIAGNOSIS — K621 Rectal polyp: Secondary | ICD-10-CM | POA: Diagnosis not present

## 2020-11-14 DIAGNOSIS — I69354 Hemiplegia and hemiparesis following cerebral infarction affecting left non-dominant side: Secondary | ICD-10-CM | POA: Diagnosis not present

## 2020-11-14 DIAGNOSIS — I4819 Other persistent atrial fibrillation: Secondary | ICD-10-CM | POA: Diagnosis present

## 2020-11-14 DIAGNOSIS — M10061 Idiopathic gout, right knee: Secondary | ICD-10-CM | POA: Diagnosis not present

## 2020-11-14 DIAGNOSIS — M10261 Drug-induced gout, right knee: Secondary | ICD-10-CM | POA: Diagnosis not present

## 2020-11-14 DIAGNOSIS — I700 Atherosclerosis of aorta: Secondary | ICD-10-CM | POA: Diagnosis not present

## 2020-11-14 DIAGNOSIS — R229 Localized swelling, mass and lump, unspecified: Secondary | ICD-10-CM

## 2020-11-14 DIAGNOSIS — L89890 Pressure ulcer of other site, unstageable: Secondary | ICD-10-CM | POA: Diagnosis not present

## 2020-11-14 DIAGNOSIS — I480 Paroxysmal atrial fibrillation: Secondary | ICD-10-CM | POA: Diagnosis present

## 2020-11-14 DIAGNOSIS — K8020 Calculus of gallbladder without cholecystitis without obstruction: Secondary | ICD-10-CM | POA: Diagnosis not present

## 2020-11-14 DIAGNOSIS — I130 Hypertensive heart and chronic kidney disease with heart failure and stage 1 through stage 4 chronic kidney disease, or unspecified chronic kidney disease: Secondary | ICD-10-CM | POA: Diagnosis present

## 2020-11-14 DIAGNOSIS — D62 Acute posthemorrhagic anemia: Secondary | ICD-10-CM | POA: Diagnosis not present

## 2020-11-14 DIAGNOSIS — N289 Disorder of kidney and ureter, unspecified: Secondary | ICD-10-CM | POA: Diagnosis not present

## 2020-11-14 DIAGNOSIS — Z85038 Personal history of other malignant neoplasm of large intestine: Secondary | ICD-10-CM | POA: Diagnosis not present

## 2020-11-14 DIAGNOSIS — G4700 Insomnia, unspecified: Secondary | ICD-10-CM | POA: Diagnosis not present

## 2020-11-14 DIAGNOSIS — K5730 Diverticulosis of large intestine without perforation or abscess without bleeding: Secondary | ICD-10-CM | POA: Diagnosis present

## 2020-11-14 DIAGNOSIS — Z6841 Body Mass Index (BMI) 40.0 and over, adult: Secondary | ICD-10-CM | POA: Diagnosis not present

## 2020-11-14 DIAGNOSIS — Z8249 Family history of ischemic heart disease and other diseases of the circulatory system: Secondary | ICD-10-CM | POA: Diagnosis not present

## 2020-11-14 DIAGNOSIS — N189 Chronic kidney disease, unspecified: Secondary | ICD-10-CM | POA: Diagnosis not present

## 2020-11-14 DIAGNOSIS — D509 Iron deficiency anemia, unspecified: Secondary | ICD-10-CM | POA: Diagnosis present

## 2020-11-14 DIAGNOSIS — I69322 Dysarthria following cerebral infarction: Secondary | ICD-10-CM | POA: Diagnosis not present

## 2020-11-14 DIAGNOSIS — R6510 Systemic inflammatory response syndrome (SIRS) of non-infectious origin without acute organ dysfunction: Secondary | ICD-10-CM | POA: Diagnosis not present

## 2020-11-14 DIAGNOSIS — T39395A Adverse effect of other nonsteroidal anti-inflammatory drugs [NSAID], initial encounter: Secondary | ICD-10-CM | POA: Diagnosis present

## 2020-11-14 DIAGNOSIS — S20212A Contusion of left front wall of thorax, initial encounter: Secondary | ICD-10-CM

## 2020-11-14 DIAGNOSIS — I69398 Other sequelae of cerebral infarction: Secondary | ICD-10-CM | POA: Diagnosis not present

## 2020-11-14 DIAGNOSIS — N182 Chronic kidney disease, stage 2 (mild): Secondary | ICD-10-CM | POA: Diagnosis present

## 2020-11-14 DIAGNOSIS — E1165 Type 2 diabetes mellitus with hyperglycemia: Secondary | ICD-10-CM | POA: Diagnosis present

## 2020-11-14 DIAGNOSIS — S299XXA Unspecified injury of thorax, initial encounter: Secondary | ICD-10-CM | POA: Diagnosis not present

## 2020-11-14 DIAGNOSIS — M1A9XX0 Chronic gout, unspecified, without tophus (tophi): Secondary | ICD-10-CM | POA: Diagnosis present

## 2020-11-14 DIAGNOSIS — K2950 Unspecified chronic gastritis without bleeding: Secondary | ICD-10-CM | POA: Diagnosis present

## 2020-11-14 DIAGNOSIS — K2210 Ulcer of esophagus without bleeding: Secondary | ICD-10-CM | POA: Diagnosis not present

## 2020-11-14 DIAGNOSIS — E785 Hyperlipidemia, unspecified: Secondary | ICD-10-CM | POA: Diagnosis present

## 2020-11-14 DIAGNOSIS — E876 Hypokalemia: Secondary | ICD-10-CM | POA: Diagnosis not present

## 2020-11-14 DIAGNOSIS — E1169 Type 2 diabetes mellitus with other specified complication: Secondary | ICD-10-CM | POA: Diagnosis not present

## 2020-11-14 LAB — HEPARIN LEVEL (UNFRACTIONATED): Heparin Unfractionated: 0.23 IU/mL — ABNORMAL LOW (ref 0.30–0.70)

## 2020-11-14 LAB — URINALYSIS, ROUTINE W REFLEX MICROSCOPIC
Bilirubin Urine: NEGATIVE
Glucose, UA: NEGATIVE mg/dL
Hgb urine dipstick: NEGATIVE
Ketones, ur: NEGATIVE mg/dL
Leukocytes,Ua: NEGATIVE
Nitrite: NEGATIVE
Protein, ur: NEGATIVE mg/dL
Specific Gravity, Urine: 1.014 (ref 1.005–1.030)
pH: 5 (ref 5.0–8.0)

## 2020-11-14 LAB — CBC WITH DIFFERENTIAL/PLATELET
Abs Immature Granulocytes: 0.03 10*3/uL (ref 0.00–0.07)
Basophils Absolute: 0 10*3/uL (ref 0.0–0.1)
Basophils Relative: 0 %
Eosinophils Absolute: 0 10*3/uL (ref 0.0–0.5)
Eosinophils Relative: 0 %
HCT: 34.9 % — ABNORMAL LOW (ref 39.0–52.0)
Hemoglobin: 11.3 g/dL — ABNORMAL LOW (ref 13.0–17.0)
Immature Granulocytes: 0 %
Lymphocytes Relative: 14 %
Lymphs Abs: 1.3 10*3/uL (ref 0.7–4.0)
MCH: 25.8 pg — ABNORMAL LOW (ref 26.0–34.0)
MCHC: 32.4 g/dL (ref 30.0–36.0)
MCV: 79.7 fL — ABNORMAL LOW (ref 80.0–100.0)
Monocytes Absolute: 0.8 10*3/uL (ref 0.1–1.0)
Monocytes Relative: 9 %
Neutro Abs: 6.8 10*3/uL (ref 1.7–7.7)
Neutrophils Relative %: 77 %
Platelets: 274 10*3/uL (ref 150–400)
RBC: 4.38 MIL/uL (ref 4.22–5.81)
RDW: 14.7 % (ref 11.5–15.5)
WBC: 8.9 10*3/uL (ref 4.0–10.5)
nRBC: 0 % (ref 0.0–0.2)

## 2020-11-14 LAB — COMPREHENSIVE METABOLIC PANEL
ALT: 16 U/L (ref 0–44)
AST: 19 U/L (ref 15–41)
Albumin: 4 g/dL (ref 3.5–5.0)
Alkaline Phosphatase: 65 U/L (ref 38–126)
Anion gap: 13 (ref 5–15)
BUN: 39 mg/dL — ABNORMAL HIGH (ref 6–20)
CO2: 25 mmol/L (ref 22–32)
Calcium: 9.8 mg/dL (ref 8.9–10.3)
Chloride: 104 mmol/L (ref 98–111)
Creatinine, Ser: 2.49 mg/dL — ABNORMAL HIGH (ref 0.61–1.24)
GFR, Estimated: 30 mL/min — ABNORMAL LOW (ref >60)
Glucose, Bld: 172 mg/dL — ABNORMAL HIGH (ref 70–99)
Potassium: 3.8 mmol/L (ref 3.5–5.1)
Sodium: 142 mmol/L (ref 135–145)
Total Bilirubin: 0.9 mg/dL (ref 0.3–1.2)
Total Protein: 8.4 g/dL — ABNORMAL HIGH (ref 6.5–8.1)

## 2020-11-14 LAB — HEMOGLOBIN A1C
Hgb A1c MFr Bld: 6.7 % — ABNORMAL HIGH (ref 4.8–5.6)
Mean Plasma Glucose: 145.59 mg/dL

## 2020-11-14 LAB — GLUCOSE, CAPILLARY
Glucose-Capillary: 128 mg/dL — ABNORMAL HIGH (ref 70–99)
Glucose-Capillary: 180 mg/dL — ABNORMAL HIGH (ref 70–99)
Glucose-Capillary: 136 mg/dL — ABNORMAL HIGH (ref 70–99)
Glucose-Capillary: 142 mg/dL — ABNORMAL HIGH (ref 70–99)

## 2020-11-14 LAB — ECHOCARDIOGRAM COMPLETE
AV Mean grad: 4.5 mmHg
AV Peak grad: 8.6 mmHg
Ao pk vel: 1.47 m/s
Area-P 1/2: 3.27 cm2
S' Lateral: 2.4 cm

## 2020-11-14 LAB — IRON AND TIBC
Iron: 22 ug/dL — ABNORMAL LOW (ref 45–182)
Saturation Ratios: 10 % — ABNORMAL LOW (ref 17.9–39.5)
TIBC: 211 ug/dL — ABNORMAL LOW (ref 250–450)
UIBC: 189 ug/dL

## 2020-11-14 LAB — MAGNESIUM: Magnesium: 2.4 mg/dL (ref 1.7–2.4)

## 2020-11-14 LAB — FERRITIN: Ferritin: 388 ng/mL — ABNORMAL HIGH (ref 24–336)

## 2020-11-14 LAB — TROPONIN I (HIGH SENSITIVITY)
Troponin I (High Sensitivity): 4 ng/L (ref <18)
Troponin I (High Sensitivity): 4 ng/L (ref <18)
Troponin I (High Sensitivity): 4 ng/L (ref <18)

## 2020-11-14 LAB — MRSA NEXT GEN BY PCR, NASAL: MRSA by PCR Next Gen: NOT DETECTED

## 2020-11-14 LAB — CK: Total CK: 707 U/L — ABNORMAL HIGH (ref 49–397)

## 2020-11-14 LAB — HIV ANTIBODY (ROUTINE TESTING W REFLEX): HIV Screen 4th Generation wRfx: NONREACTIVE

## 2020-11-14 LAB — RESP PANEL BY RT-PCR (FLU A&B, COVID) ARPGX2
Influenza A by PCR: NEGATIVE
Influenza B by PCR: NEGATIVE
SARS Coronavirus 2 by RT PCR: NEGATIVE

## 2020-11-14 LAB — TSH: TSH: 1.076 u[IU]/mL (ref 0.350–4.500)

## 2020-11-14 MED ORDER — SODIUM CHLORIDE 0.9 % IV BOLUS
500.0000 mL | Freq: Once | INTRAVENOUS | Status: AC
  Administered 2020-11-14: 500 mL via INTRAVENOUS

## 2020-11-14 MED ORDER — HEPARIN (PORCINE) 25000 UT/250ML-% IV SOLN
1400.0000 [IU]/h | INTRAVENOUS | Status: DC
  Administered 2020-11-14: 1150 [IU]/h via INTRAVENOUS
  Administered 2020-11-15: 1250 [IU]/h via INTRAVENOUS
  Filled 2020-11-14 (×3): qty 250

## 2020-11-14 MED ORDER — SODIUM CHLORIDE 0.9% FLUSH
3.0000 mL | Freq: Two times a day (BID) | INTRAVENOUS | Status: DC
  Administered 2020-11-14 – 2020-11-23 (×16): 3 mL via INTRAVENOUS

## 2020-11-14 MED ORDER — ATORVASTATIN CALCIUM 80 MG PO TABS
80.0000 mg | ORAL_TABLET | Freq: Every day | ORAL | Status: DC
  Administered 2020-11-15 – 2020-11-23 (×9): 80 mg via ORAL
  Filled 2020-11-14 (×10): qty 1

## 2020-11-14 MED ORDER — ACETAMINOPHEN-CODEINE #3 300-30 MG PO TABS
1.0000 | ORAL_TABLET | Freq: Three times a day (TID) | ORAL | Status: DC | PRN
  Administered 2020-11-14 – 2020-11-17 (×2): 1 via ORAL
  Filled 2020-11-14 (×2): qty 1

## 2020-11-14 MED ORDER — DILTIAZEM HCL 60 MG PO TABS
30.0000 mg | ORAL_TABLET | Freq: Two times a day (BID) | ORAL | Status: DC
  Administered 2020-11-14 – 2020-11-15 (×3): 30 mg via ORAL
  Filled 2020-11-14 (×3): qty 1

## 2020-11-14 MED ORDER — ACETAMINOPHEN 650 MG RE SUPP: 650.0000 mg | Freq: Four times a day (QID) | RECTAL | Status: DC | PRN

## 2020-11-14 MED ORDER — PERFLUTREN LIPID MICROSPHERE
1.0000 mL | INTRAVENOUS | Status: AC | PRN
Start: 2020-11-14 — End: 2020-11-14
  Administered 2020-11-14: 2 mL via INTRAVENOUS
  Filled 2020-11-14: qty 10

## 2020-11-14 MED ORDER — HEPARIN BOLUS VIA INFUSION
4000.0000 [IU] | Freq: Once | INTRAVENOUS | Status: AC
  Administered 2020-11-14: 4000 [IU] via INTRAVENOUS
  Filled 2020-11-14: qty 4000

## 2020-11-14 MED ORDER — ACETAMINOPHEN 325 MG PO TABS
650.0000 mg | ORAL_TABLET | Freq: Four times a day (QID) | ORAL | Status: DC | PRN
  Administered 2020-11-15 (×3): 650 mg via ORAL
  Filled 2020-11-14 (×3): qty 2

## 2020-11-14 MED ORDER — INSULIN ASPART 100 UNIT/ML IJ SOLN
0.0000 [IU] | Freq: Three times a day (TID) | INTRAMUSCULAR | Status: DC
  Administered 2020-11-14: 1 [IU] via SUBCUTANEOUS
  Administered 2020-11-14: 2 [IU] via SUBCUTANEOUS
  Administered 2020-11-15: 1 [IU] via SUBCUTANEOUS

## 2020-11-14 MED ORDER — ALBUTEROL SULFATE (2.5 MG/3ML) 0.083% IN NEBU: 2.5000 mg | INHALATION_SOLUTION | Freq: Four times a day (QID) | RESPIRATORY_TRACT | Status: DC | PRN

## 2020-11-14 MED ORDER — OXYBUTYNIN CHLORIDE 5 MG PO TABS
5.0000 mg | ORAL_TABLET | Freq: Two times a day (BID) | ORAL | Status: DC
  Administered 2020-11-14 – 2020-11-23 (×19): 5 mg via ORAL
  Filled 2020-11-14 (×20): qty 1

## 2020-11-14 MED ORDER — HYDROCODONE-ACETAMINOPHEN 5-325 MG PO TABS: 1.0000 | ORAL_TABLET | Freq: Four times a day (QID) | ORAL | Status: DC | PRN

## 2020-11-14 MED ORDER — DILTIAZEM HCL-DEXTROSE 125-5 MG/125ML-% IV SOLN (PREMIX)
5.0000 mg/h | INTRAVENOUS | Status: DC
Start: 2020-11-14 — End: 2020-11-14
  Administered 2020-11-14 (×2): 5 mg/h via INTRAVENOUS
  Filled 2020-11-14: qty 125

## 2020-11-14 MED ORDER — SODIUM CHLORIDE 0.9 % IV SOLN
INTRAVENOUS | Status: DC
  Administered 2020-11-14: 100 mL/h via INTRAVENOUS

## 2020-11-14 MED ORDER — PANTOPRAZOLE SODIUM 40 MG PO TBEC
40.0000 mg | DELAYED_RELEASE_TABLET | Freq: Every day | ORAL | Status: DC
  Administered 2020-11-14: 40 mg via ORAL
  Filled 2020-11-14: qty 1

## 2020-11-14 MED ORDER — CARVEDILOL 25 MG PO TABS
25.0000 mg | ORAL_TABLET | Freq: Two times a day (BID) | ORAL | Status: DC
  Administered 2020-11-14 – 2020-11-23 (×15): 25 mg via ORAL
  Filled 2020-11-14 (×17): qty 1

## 2020-11-14 MED ORDER — ATORVASTATIN CALCIUM 80 MG PO TABS: 80.0000 mg | ORAL_TABLET | Freq: Every day | ORAL | Status: DC

## 2020-11-14 NOTE — ED Provider Notes (Signed)
[facility] EMERGENCY DEPT Provider Note   CSN: [id number] Arrival date & time: 11/13/20  2044     History Chief Complaint  Patient presents with   Torso Pain    [name] is a 55 y.o. male.  Patient is a 55 year old male with past medical history of prior CVA with left hemiparesis, diabetes, hypertension, and rectal cancer.  Patient presenting today for evaluation of fall.  He was recently diagnosed with a flareup of gout in his knee which has made transferring from his wheelchair to the bathroom toilet much more difficult.  He was attempting to transfer yesterday when he fell and landed on the floor.  He has been having pain in his left lateral ribs and left abdomen since.  He describes it as feeling as if "someone is stabbing him".  He denies shortness of breath.  The history is provided by the patient.      Past Medical History:  Diagnosis Date   Colon cancer ([place]) 2009   rectal   Diabetes mellitus without complication ([place])    Gait abnormality 03/31/2018   Hemiparesis and speech and language deficit as late effects of stroke ([place]) 03/31/2018   Hypertension    Seizure ([place])    Stroke ([place]) 2018    Patient Active Problem List   Diagnosis Date Noted   Pain due to onychomycosis of toenails of both feet 09/21/2020   Edema of both legs 09/29/2019   Meningioma ([place]) 04/13/2019   Hemiparesis and speech and language deficit as late effects of stroke ([place]) 03/31/2018   Gait abnormality 03/31/2018   Spastic hemiplegia affecting left nondominant side ([place]) 12/18/2017   Subacromial impingement of left shoulder 06/09/2017   Spastic neurogenic bladder 06/09/2017   Dysarthria as late effect of stroke 04/01/2017   Neurogenic bladder due to old stroke 12/25/2016   Right pontine cerebrovascular accident ([place]) 12/18/2016   Left hemiparesis ([place])    Diastolic dysfunction    Dysphagia, post-stroke    Acute ischemic stroke ([place]) 12/12/2016   Seizures ([place])  07/22/2014   Hypertension 07/22/2014   Type 2 diabetes mellitus with vascular disease ([place]) 07/22/2014   Brain mass    Malignant neoplasm of rectum ([place]) 09/02/2007    Past Surgical History:  Procedure Laterality Date   APPENDECTOMY     JOINT REPLACEMENT Left 2004   Left hip   TOTAL HIP ARTHROPLASTY         Family History  Problem Relation Age of Onset   Hypertension Mother    Heart disease Mother    Hyperlipidemia Mother    Heart failure Mother    Hypertension Sister    Hypertension Brother    Stroke Father    Hypertension Father    Colon cancer Maternal Grandmother    Diabetes Maternal Grandmother    Esophageal cancer Neg Hx     Social History   Tobacco Use   Smoking status: Never   Smokeless tobacco: Never  Vaping Use   Vaping Use: Never used  Substance Use Topics   Alcohol use: Not Currently   Drug use: No    Home Medications Prior to Admission medications   Medication Sig Start Date End Date Taking? Authorizing Provider  acetaminophen-codeine (TYLENOL #3) 300-30 MG tablet Take 1 tablet by mouth every 8 (eight) hours as needed for moderate pain. 10/26/20   Ladell Pier, MD  amLODipine (NORVASC) 10 MG tablet TAKE 1 TABLET BY MOUTH EVERY DAY 11/11/20   Ladell Pier, MD  aspirin 325 MG tablet Take 1 tablet (325 mg total) by mouth daily. 12/19/16   Velvet Bathe, MD  atorvastatin (LIPITOR) 80 MG tablet TAKE 1 TABLET BY MOUTH EVERY DAY 08/12/20   Ladell Pier, MD  carvedilol (COREG) 25 MG tablet Take 1 tablet (25 mg total) by mouth 2 (two) times daily with a meal. 06/07/20   Ladell Pier, MD  diclofenac Sodium (VOLTAREN) 1 % GEL Apply 2 g topically 4 (four) times daily. 10/26/20   Ladell Pier, MD  indomethacin (INDOCIN) 50 MG capsule TAKE 1 CAPSULE (50 MG TOTAL) BY MOUTH 2 (TWO) TIMES DAILY WITH A MEAL. 11/11/20   Ladell Pier, MD  Melatonin 3 MG TABS Take 3 tablets (9 mg total) by mouth at bedtime. Patient not taking: Reported  on 10/15/2020 01/08/17   Cathlyn Parsons, PA-C  metFORMIN (GLUCOPHAGE) 500 MG tablet TAKE 1 TABLET BY MOUTH EVERY DAY WITH BREAKFAST 11/11/20   Ladell Pier, MD  olmesartan (BENICAR) 20 MG tablet TAKE 1 TABLET BY MOUTH EVERY DAY 11/11/20   Ladell Pier, MD  oxybutynin (DITROPAN) 5 MG tablet TAKE 1 TABLET BY MOUTH TWICE A DAY 08/22/20   Ladell Pier, MD    Allergies    Patient has no known allergies.  Review of Systems   Review of Systems  All other systems reviewed and are negative.  Physical Exam Updated Vital Signs BP (!) 88/63 (BP Location: Right Arm)   Pulse (!) 145   Temp 98.3 F (36.8 C) (Oral)   Resp 17   Ht 5\' 4"  (1.626 m)   Wt 91 kg   SpO2 98%   BMI 34.44 kg/m   Physical Exam Vitals and nursing note reviewed.  Constitutional:      General: He is not in acute distress.    Appearance: He is well-developed. He is not diaphoretic.  HENT:     Head: Normocephalic and atraumatic.  Cardiovascular:     Rate and Rhythm: Tachycardia present. Rhythm irregular.     Heart sounds: No murmur heard.   No friction rub.  Pulmonary:     Effort: Pulmonary effort is normal. No respiratory distress.     Breath sounds: Normal breath sounds. No wheezing or rales.     Comments: There is tenderness to palpation of the left lateral chest.  There is no palpable abnormality or crepitus.  Breath sounds are equal bilaterally. Abdominal:     General: Bowel sounds are normal. There is no distension.     Palpations: Abdomen is soft.     Tenderness: There is no abdominal tenderness.  Musculoskeletal:        General: Normal range of motion.     Cervical back: Normal range of motion and neck supple.  Skin:    General: Skin is warm and dry.  Neurological:     Mental Status: He is alert and oriented to person, place, and time.     Coordination: Coordination normal.     Comments: Baseline left-sided hemiparesis is noted, but no acute neurologic deficits noted otherwise.     ED Results / Procedures / Treatments   Labs (all labs ordered are listed, but only abnormal results are displayed) Labs Reviewed  COMPREHENSIVE METABOLIC PANEL  CBC WITH DIFFERENTIAL/PLATELET  TROPONIN I (HIGH SENSITIVITY)    EKG EKG Interpretation  Date/Time:  Wednesday November 14 2020 01:34:39 EDT Ventricular Rate:  146 PR Interval:    QRS Duration: 85 QT Interval:  312 QTC Calculation:  487 R Axis:   42 Text Interpretation: Atrial fibrillation with rapid ventricular response Borderline prolonged QT interval Confirmed by Veryl Speak [phone]) on 11/14/2020 3:50:30 AM  Radiology No results found.  Procedures Procedures   Medications Ordered in ED Medications  sodium chloride 0.9 % bolus 500 mL (has no administration in time range)    ED Course  I have reviewed the triage vital signs and the nursing notes.  Pertinent labs & imaging results that were available during my care of the patient were reviewed by me and considered in my medical decision making (see chart for details).    MDM Rules/Calculators/A&P  Patient is a 55 year old male with history of stroke with residual left-sided hemiparesis.  Patient attempting to transfer from chair to toilet yesterday when he fell and landed on his left side.  He is having pain in his left ribs and left lateral abdomen.  CT scan of these areas are both unremarkable.  Patient arrived here in A. fib with RVR.  Patient given IV fluids due to borderline low blood pressures, then started on Cardizem drip.  Patient has no prior history of atrial fibrillation.  He also has a bump in his creatinine from baseline.  Today's creatinine is 2.5.  Care discussed with Dr. Alcario Drought from the hospitalist service who agrees to admit.  CRITICAL CARE Performed by: Veryl Speak Total critical care time: 35 minutes Critical care time was exclusive of separately billable procedures and treating other patients. Critical care was necessary to  treat or prevent imminent or life-threatening deterioration. Critical care was time spent personally by me on the following activities: development of treatment plan with patient and/or surrogate as well as nursing, discussions with consultants, evaluation of patient's response to treatment, examination of patient, obtaining history from patient or surrogate, ordering and performing treatments and interventions, ordering and review of laboratory studies, ordering and review of radiographic studies, pulse oximetry and re-evaluation of patient's condition.   Final Clinical Impression(s) / ED Diagnoses Final diagnoses:  None    Rx / DC Orders ED Discharge Orders     None        Veryl Speak, MD 11/14/20 [phone]

## 2020-11-14 NOTE — Progress Notes (Signed)
ANTICOAGULATION CONSULT NOTE - Initial Consult  Pharmacy Consult for Heparin Indication: atrial fibrillation  No Known Allergies  Patient Measurements: Height: 5\' 4"  (162.6 cm) Weight: 91 kg (200 lb 9.9 oz) IBW/kg (Calculated) : 59.2 Heparin Dosing Weight: 80 kg  Vital Signs: Temp: 98.1 F (36.7 C) (11/02 1600) Temp Source: Oral (11/02 1600) BP: 98/47 (11/02 1600) Pulse Rate: 88 (11/02 1600)  Labs: Recent Labs    11/14/20 0133 11/14/20 0603 11/14/20 0813 11/14/20 1637  HGB 11.3*  --   --   --   HCT 34.9*  --   --   --   PLT 274  --   --   --   HEPARINUNFRC  --   --   --  0.23*  CREATININE 2.49*  --   --   --   CKTOTAL  --  707*  --   --   TROPONINIHS 4 4 4   --      Estimated Creatinine Clearance: 34.5 mL/min (A) (by C-G formula based on SCr of 2.49 mg/dL (H)).   Medical History: Past Medical History:  Diagnosis Date   Colon cancer ([place]) 2009   rectal   Diabetes mellitus without complication ([place])    Gait abnormality 03/31/2018   Hemiparesis and speech and language deficit as late effects of stroke ([place]) 03/31/2018   Hypertension    Seizure ([place])    Stroke ([place]) 2018   Assessment:  55 yr old male with hx CVA with residual left hemiparesis to begin IV heparin for atrial fibrillation. Not on anticoagulation prior to admission.  Hgb 11.3, platelet count 274. Serume creatinine 2.49, unknown baseline but was 1.21 in May 2022.  Heparin level 0.23 (on heparin 1150 units/hr) No signs/symptoms of bleed per nurse  Goal of Therapy:  Heparin level 0.3-0.7 units/ml Monitor platelets by anticoagulation protocol: Yes   Plan:  Will increase to heparin 1250 units/hr (12.5 ml/hr)  Heparin level ordered for 11/3 @0200   Daily heparin level and CBC while on heparin.  Thank you for allowing pharmacy to be a part of this patient's care.  Donnald Garre, PharmD Clinical Pharmacist  Please check AMION for all [place] numbers After 10:00 PM, call [place]  [phone]

## 2020-11-14 NOTE — Progress Notes (Signed)
   11/14/20 0555 11/14/20 0815  Assess: MEWS Score  Temp 98.3 F (36.8 C) 98.3 F (36.8 C)  BP 104/68 (!) 126/91  Pulse Rate (!) 104 (!) 118  ECG Heart Rate (!) 104 (!) 128  Resp (!) 32 (!) 31  Level of Consciousness Alert Alert  SpO2  --  97 %  Assess: MEWS Score  MEWS Temp 0 0  MEWS Systolic 0 0  MEWS Pulse 1 2  MEWS RR 2 2  MEWS LOC 0 0  MEWS Score 3 4  MEWS Score Color Yellow Red  Assess: if the MEWS score is Yellow or Red  Were vital signs taken at a resting state?  --  Yes  Focused Assessment  --  No change from prior assessment  Early Detection of Sepsis Score *See Row Information*  --  Low  MEWS guidelines implemented *See Row Information*  --  Yes  Treat  MEWS Interventions  --  Escalated (See documentation below)  Take Vital Signs  Increase Vital Sign Frequency   --  Red: Q 1hr X 4 then Q 4hr X 4, if remains red, continue Q 4hrs  Escalate  MEWS: Escalate  --  Red: discuss with charge nurse/RN and provider, consider discussing with RRT  Notify: Charge Nurse/RN  Name of Charge Nurse/RN Notified  --  Marita [place]  Date Charge Nurse/RN Notified  --  11/14/20  Time Charge Nurse/RN Notified  --  [id number]  Document  Patient Outcome  --  Stabilized after interventions  Progress note created (see row info)  --  Yes

## 2020-11-14 NOTE — Progress Notes (Signed)
   11/14/20 1515  Clinical Encounter Type  Visited With Patient and family together  Visit Type Initial  Referral From Nurse  Consult/Referral To Chaplain   Chaplain responded to request for an Advance Directive. The patient's wife was at his beside. This chaplain provided A.D. education. The patient decline since his wife will be his HCPOA. This note was prepared by Jeanine Luz, M.Div..  For questions please contact by phone [phone].

## 2020-11-14 NOTE — Consult Note (Signed)
Referring Physician: Harvest Forest, MD  [name] is an 55 y.o. male.                       Chief Complaint: Atrial fibrillation  HPI: 55 years old black male with PMH of CVA with left hemiparesis, type 2 DM, HTN and rectal cancer and acute kidney injury had fall from a wheel chair due to left leg weakness and right knee gout pain.  His echocardiogram today shows normal LV systolic function. CT chest and abdomen was without acute finding. EKG showed atrial fibrillation with RVR.  His BP was low with IV diltiazem use hence it was discontinued. He has converted to sinus rhythm.  Past Medical History:  Diagnosis Date   Colon cancer ([place]) 2009   rectal   Diabetes mellitus without complication ([place])    Gait abnormality 03/31/2018   Hemiparesis and speech and language deficit as late effects of stroke ([place]) 03/31/2018   Hypertension    Seizure ([place])    Stroke ([place]) 2018      Past Surgical History:  Procedure Laterality Date   APPENDECTOMY     JOINT REPLACEMENT Left 2004   Left hip   TOTAL HIP ARTHROPLASTY      Family History  Problem Relation Age of Onset   Hypertension Mother    Heart disease Mother    Hyperlipidemia Mother    Heart failure Mother    Hypertension Sister    Hypertension Brother    Stroke Father    Hypertension Father    Colon cancer Maternal Grandmother    Diabetes Maternal Grandmother    Esophageal cancer Neg Hx    Social History:  reports that he has never smoked. He has never used smokeless tobacco. He reports that he does not currently use alcohol. He reports that he does not use drugs.  Allergies: No Known Allergies  Medications Prior to Admission  Medication Sig Dispense Refill   acetaminophen (TYLENOL) 500 MG tablet Take 1,000 mg by mouth every 6 (six) hours as needed for mild pain.     amLODipine (NORVASC) 10 MG tablet TAKE 1 TABLET BY MOUTH EVERY DAY (Patient taking differently: Take 10 mg by mouth daily.) 90 tablet 0   atorvastatin (LIPITOR)  80 MG tablet TAKE 1 TABLET BY MOUTH EVERY DAY (Patient taking differently: Take 80 mg by mouth daily.) 90 tablet 1   carvedilol (COREG) 25 MG tablet Take 1 tablet (25 mg total) by mouth 2 (two) times daily with a meal. 180 tablet 1   diclofenac Sodium (VOLTAREN) 1 % GEL Apply 2 g topically 4 (four) times daily. (Patient taking differently: Apply 2 g topically 2 (two) times daily as needed (pain).) 100 g 0   metFORMIN (GLUCOPHAGE) 500 MG tablet TAKE 1 TABLET BY MOUTH EVERY DAY WITH BREAKFAST (Patient taking differently: Take 500 mg by mouth daily with breakfast.) 90 tablet 0   olmesartan (BENICAR) 20 MG tablet TAKE 1 TABLET BY MOUTH EVERY DAY (Patient taking differently: Take 20 mg by mouth daily.) 90 tablet 0   oxybutynin (DITROPAN) 5 MG tablet TAKE 1 TABLET BY MOUTH TWICE A DAY 180 tablet 1   acetaminophen-codeine (TYLENOL #3) 300-30 MG tablet Take 1 tablet by mouth every 8 (eight) hours as needed for moderate pain. (Patient not taking: Reported on 11/14/2020) 12 tablet 0   aspirin 325 MG tablet Take 1 tablet (325 mg total) by mouth daily. (Patient not taking: No sig reported) 30 tablet 0  Results for orders placed or performed during the hospital encounter of 11/14/20 (from the past 48 hour(s))  Comprehensive metabolic panel     Status: Abnormal   Collection Time: 11/14/20  1:33 AM  Result Value Ref Range   Sodium 142 135 - 145 mmol/L   Potassium 3.8 3.5 - 5.1 mmol/L   Chloride 104 98 - 111 mmol/L   CO2 25 22 - 32 mmol/L   Glucose, Bld 172 (H) 70 - 99 mg/dL    Comment: Glucose reference range applies only to samples taken after fasting for at least 8 hours.   BUN 39 (H) 6 - 20 mg/dL   Creatinine, Ser 2.49 (H) 0.61 - 1.24 mg/dL   Calcium 9.8 8.9 - 10.3 mg/dL   Total Protein 8.4 (H) 6.5 - 8.1 g/dL   Albumin 4.0 3.5 - 5.0 g/dL   AST 19 15 - 41 U/L   ALT 16 0 - 44 U/L   Alkaline Phosphatase 65 38 - 126 U/L   Total Bilirubin 0.9 0.3 - 1.2 mg/dL   GFR, Estimated 30 (L) >60 mL/min     Comment: (NOTE) Calculated using the CKD-EPI Creatinine Equation (2021)    Anion gap 13 5 - 15    Comment: Performed at [facility], [place], [place] [address]  CBC with Differential     Status: Abnormal   Collection Time: 11/14/20  1:33 AM  Result Value Ref Range   WBC 8.9 4.0 - 10.5 K/uL   RBC 4.38 4.22 - 5.81 MIL/uL   Hemoglobin 11.3 (L) 13.0 - 17.0 g/dL   HCT 34.9 (L) 39.0 - 52.0 %   MCV 79.7 (L) 80.0 - 100.0 fL   MCH 25.8 (L) 26.0 - 34.0 pg   MCHC 32.4 30.0 - 36.0 g/dL   RDW 14.7 11.5 - 15.5 %   Platelets 274 150 - 400 K/uL   nRBC 0.0 0.0 - 0.2 %   Neutrophils Relative % 77 %   Neutro Abs 6.8 1.7 - 7.7 K/uL   Lymphocytes Relative 14 %   Lymphs Abs 1.3 0.7 - 4.0 K/uL   Monocytes Relative 9 %   Monocytes Absolute 0.8 0.1 - 1.0 K/uL   Eosinophils Relative 0 %   Eosinophils Absolute 0.0 0.0 - 0.5 K/uL   Basophils Relative 0 %   Basophils Absolute 0.0 0.0 - 0.1 K/uL   Immature Granulocytes 0 %   Abs Immature Granulocytes 0.03 0.00 - 0.07 K/uL    Comment: Performed at [facility], [place], [place] [address]  Troponin I (High Sensitivity)     Status: None   Collection Time: 11/14/20  1:33 AM  Result Value Ref Range   Troponin I (High Sensitivity) 4 <18 ng/L    Comment: (NOTE) Elevated high sensitivity troponin I (hsTnI) values and significant  changes across serial measurements may suggest ACS but many other  chronic and acute conditions are known to elevate hsTnI results.  Refer to the "Links" section for chest pain algorithms and additional  guidance. Performed at [facility], [address] [place], [place] [address]   Resp Panel by RT-PCR (Flu A&B, Covid) Nasopharyngeal Swab     Status: None   Collection Time: 11/14/20  4:02 AM   Specimen: Nasopharyngeal Swab; Nasopharyngeal(NP) swabs in vial transport medium  Result Value Ref Range   SARS Coronavirus 2 by RT PCR NEGATIVE  NEGATIVE    Comment: (NOTE) SARS-CoV-2 target nucleic acids are NOT DETECTED.  The SARS-CoV-2 RNA is generally detectable in upper respiratory specimens during the acute phase of infection. The lowest concentration of SARS-CoV-2 viral copies this assay can detect is 138 copies/mL. A negative result does not preclude SARS-Cov-2 infection and should not be used as the sole basis for treatment or other patient management decisions. A negative result may occur with  improper specimen collection/handling, submission of specimen other than nasopharyngeal swab, presence of viral mutation(s) within the areas targeted by this assay, and inadequate number of viral copies(<138 copies/mL). A negative result must be combined with clinical observations, patient history, and epidemiological information. The expected result is Negative.  Fact Sheet for Patients:  EntrepreneurPulse.com.au  Fact Sheet for Healthcare Providers:  IncredibleEmployment.be  This test is no t yet approved or cleared by the [place] FDA and  has been authorized for detection and/or diagnosis of SARS-CoV-2 by FDA under an Emergency Use Authorization (EUA). This EUA will remain  in effect (meaning this test can be used) for the duration of the COVID-19 declaration under Section 564(b)(1) of the Act, 21 U.S.C.section 360bbb-3(b)(1), unless the authorization is terminated  or revoked sooner.       Influenza A by PCR NEGATIVE NEGATIVE   Influenza B by PCR NEGATIVE NEGATIVE    Comment: (NOTE) The Xpert Xpress SARS-CoV-2/FLU/RSV plus assay is intended as an aid in the diagnosis of influenza from Nasopharyngeal swab specimens and should not be used as a sole basis for treatment. Nasal washings and aspirates are unacceptable for Xpert Xpress SARS-CoV-2/FLU/RSV testing.  Fact Sheet for Patients: EntrepreneurPulse.com.au  Fact Sheet for Healthcare  Providers: IncredibleEmployment.be  This test is not yet approved or cleared by the [place] FDA and has been authorized for detection and/or diagnosis of SARS-CoV-2 by FDA under an Emergency Use Authorization (EUA). This EUA will remain in effect (meaning this test can be used) for the duration of the COVID-19 declaration under Section 564(b)(1) of the Act, 21 U.S.C. section 360bbb-3(b)(1), unless the authorization is terminated or revoked.  Performed at [facility], [address] [place], [place] [address]   MRSA Next Gen by PCR, Nasal     Status: None   Collection Time: 11/14/20  5:38 AM   Specimen: Nasal Mucosa; Nasal Swab  Result Value Ref Range   MRSA by PCR Next Gen NOT DETECTED NOT DETECTED    Comment: (NOTE) The GeneXpert MRSA Assay (FDA approved for NASAL specimens only), is one component of a comprehensive MRSA colonization surveillance program. It is not intended to diagnose MRSA infection nor to guide or monitor treatment for MRSA infections. Test performance is not FDA approved in patients less than 75 years old. Performed at [facility], [place] [address] [place], [name] [address]   Magnesium     Status: None   Collection Time: 11/14/20  6:03 AM  Result Value Ref Range   Magnesium 2.4 1.7 - 2.4 mg/dL    Comment: Performed at [facility] [address], [place], [place] [address]  CK     Status: Abnormal   Collection Time: 11/14/20  6:03 AM  Result Value Ref Range   Total CK 707 (H) 49 - 397 U/L    Comment: Performed at [facility], [place] [address], [place], [place] [address]  Hemoglobin A1c     Status: Abnormal   Collection Time: 11/14/20  6:03 AM  Result Value Ref Range   Hgb A1c MFr Bld 6.7 (H) 4.8 - 5.6 %  Comment: (NOTE) Pre diabetes:          5.7%-6.4%  Diabetes:              >6.4%  Glycemic control for   <7.0% adults with diabetes    Mean Plasma Glucose 145.59 mg/dL    Comment:  Performed at [facility] [address] [place], [place] [address]  Troponin I (High Sensitivity)     Status: None   Collection Time: 11/14/20  6:03 AM  Result Value Ref Range   Troponin I (High Sensitivity) 4 <18 ng/L    Comment: (NOTE) Elevated high sensitivity troponin I (hsTnI) values and significant  changes across serial measurements may suggest ACS but many other  chronic and acute conditions are known to elevate hsTnI results.  Refer to the "Links" section for chest pain algorithms and additional  guidance. Performed at [facility], [place] [address], [place], [place] [address]   HIV Antibody (routine testing w rflx)     Status: None   Collection Time: 11/14/20  8:13 AM  Result Value Ref Range   HIV Screen 4th Generation wRfx Non Reactive Non Reactive    Comment: Performed at [facility], [place] [address], [place], [place] [address]  Troponin I (High Sensitivity)     Status: None   Collection Time: 11/14/20  8:13 AM  Result Value Ref Range   Troponin I (High Sensitivity) 4 <18 ng/L    Comment: (NOTE) Elevated high sensitivity troponin I (hsTnI) values and significant  changes across serial measurements may suggest ACS but many other  chronic and acute conditions are known to elevate hsTnI results.  Refer to the "Links" section for chest pain algorithms and additional  guidance. Performed at [facility], [place] [address] [place], [place] [address]   Glucose, capillary     Status: Abnormal   Collection Time: 11/14/20  8:41 AM  Result Value Ref Range   Glucose-Capillary 128 (H) 70 - 99 mg/dL    Comment: Glucose reference range applies only to samples taken after fasting for at least 8 hours.  Glucose, capillary     Status: Abnormal   Collection Time: 11/14/20 11:08 AM  Result Value Ref Range   Glucose-Capillary 180 (H) 70 - 99 mg/dL    Comment: Glucose reference range applies only to samples taken after fasting for at least 8 hours.    ECHOCARDIOGRAM COMPLETE  Result Date: 11/14/2020    ECHOCARDIOGRAM REPORT   Patient Name:   AKBAR SACRA [facility] Date of Exam: 11/14/2020 Medical Rec #:  [id number]         Height:       64.0 in Accession #:    [id number]        Weight:       200.6 lb Date of Birth:  27-Dec-1965         BSA:          1.959 m Patient Age:    65 years          BP:           111/81 mmHg Patient Gender: M                 HR:           85 bpm. Exam Location:  Inpatient Procedure: 2D Echo, Cardiac Doppler, Color Doppler and Intracardiac            Opacification Agent Indications:  I48.1 Persistent atrial fibrillation  History:        Patient has no prior history of Echocardiogram examinations.                 Risk Factors:Hypertension and Diabetes.  Sonographer:    Glo Herring Referring Phys: [id number] [place]  1. Left ventricular ejection fraction, by estimation, is 60 to 65%. The left ventricle has normal function. The left ventricle has no regional wall motion abnormalities. There is mild concentric left ventricular hypertrophy. Left ventricular diastolic parameters are consistent with Grade I diastolic dysfunction (impaired relaxation).  2. Right ventricular systolic function is normal. The right ventricular size is normal.  3. Left atrial size was mildly dilated.  4. Right atrial size was mildly dilated.  5. The mitral valve is normal in structure. Trivial mitral valve regurgitation.  6. The aortic valve is tricuspid. Aortic valve regurgitation is not visualized.  7. The inferior vena cava is normal in size with greater than 50% respiratory variability, suggesting right atrial pressure of 3 mmHg. FINDINGS  Left Ventricle: Left ventricular ejection fraction, by estimation, is 60 to 65%. The left ventricle has normal function. The left ventricle has no regional wall motion abnormalities. The left ventricular internal cavity size was normal in size. There is  mild concentric left ventricular hypertrophy. Left  ventricular diastolic parameters are consistent with Grade I diastolic dysfunction (impaired relaxation). Right Ventricle: The right ventricular size is normal. No increase in right ventricular wall thickness. Right ventricular systolic function is normal. Left Atrium: Left atrial size was mildly dilated. Right Atrium: Right atrial size was mildly dilated. Pericardium: There is no evidence of pericardial effusion. Mitral Valve: The mitral valve is normal in structure. Trivial mitral valve regurgitation. Tricuspid Valve: The tricuspid valve is normal in structure. Tricuspid valve regurgitation is trivial. Aortic Valve: The aortic valve is tricuspid. Aortic valve regurgitation is not visualized. Aortic valve mean gradient measures 4.5 mmHg. Aortic valve peak gradient measures 8.6 mmHg. Pulmonic Valve: The pulmonic valve was normal in structure. Pulmonic valve regurgitation is not visualized. Aorta: The aortic root is normal in size and structure. Venous: The inferior vena cava is normal in size with greater than 50% respiratory variability, suggesting right atrial pressure of 3 mmHg. IAS/Shunts: The atrial septum is grossly normal.  LEFT VENTRICLE PLAX 2D LVIDd:         4.00 cm Diastology LVIDs:         2.40 cm LV e' medial:    5.77 cm/s LV PW:         1.30 cm LV E/e' medial:  13.4 LV IVS:        1.30 cm LV e' lateral:   9.03 cm/s                        LV E/e' lateral: 8.5  RIGHT VENTRICLE             IVC RV Basal diam:  3.40 cm     IVC diam: 1.10 cm RV S prime:     17.50 cm/s LEFT ATRIUM             Index        RIGHT ATRIUM           Index LA diam:        3.00 cm 1.53 cm/m   RA Area:     15.40 cm LA Vol (A2C):   54.5 ml 27.82 ml/m  RA Volume:   39.30 ml  20.06 ml/m LA Vol (A4C):   25.2 ml 12.86 ml/m LA Biplane Vol: 38.4 ml 19.60 ml/m  AORTIC VALVE                    PULMONIC VALVE AV Vmax:           146.50 cm/s  PV Vmax:       1.03 m/s AV Vmean:          100.250 cm/s PV Peak grad:  4.2 mmHg AV VTI:             0.242 m AV Peak Grad:      8.6 mmHg AV Mean Grad:      4.5 mmHg LVOT Vmax:         120.00 cm/s LVOT Vmean:        87.500 cm/s LVOT VTI:          0.213 m LVOT/AV VTI ratio: 0.88  AORTA Ao Root diam: 3.10 cm Ao Asc diam:  3.20 cm MITRAL VALVE MV Area (PHT): 3.27 cm    SHUNTS MV Decel Time: 232 msec    Systemic VTI: 0.21 m MV E velocity: 77.10 cm/s MV A velocity: 79.70 cm/s MV E/A ratio:  0.97 Dixie Dials MD Electronically signed by Dixie Dials MD Signature Date/Time: 11/14/2020/12:26:04 PM    Final    CT CHEST ABDOMEN PELVIS WO CONTRAST  Result Date: 11/14/2020 CLINICAL DATA:  Chest trauma EXAM: CT CHEST, ABDOMEN AND PELVIS WITHOUT CONTRAST TECHNIQUE: Multidetector CT imaging of the chest, abdomen and pelvis was performed following the standard protocol without IV contrast. COMPARISON:  None. FINDINGS: CT CHEST FINDINGS Cardiovascular: Heart size is normal without pericardial effusion. The thoracic aorta is normal in course and caliber without dissection, aneurysm, ulceration or intramural hematoma. There is calcific aortic atherosclerosis. Mediastinum/Nodes: No mediastinal hematoma. No mediastinal, hilar or axillary lymphadenopathy. The visualized thyroid and thoracic esophageal course are unremarkable. Lungs/Pleura: No pulmonary contusion, pneumothorax or pleural effusion. The central airways are clear. Musculoskeletal: No acute fracture of the ribs, sternum or the visible portions of clavicles and scapulae. CT ABDOMEN PELVIS FINDINGS Hepatobiliary: No hepatic hematoma or laceration. No biliary dilatation. Cholelithiasis without acute inflammation. Pancreas: Normal contours without ductal dilatation. No peripancreatic fluid collection. Spleen: No splenic laceration or hematoma. Adrenals/Urinary Tract: --Adrenal glands: No adrenal hemorrhage. --Right kidney/ureter: No hydronephrosis or perinephric hematoma. --Left kidney/ureter: No hydronephrosis or perinephric hematoma. --Urinary bladder: Unremarkable.  Stomach/Bowel: --Stomach/Duodenum: No hiatal hernia or other gastric abnormality. Normal duodenal course and caliber. --Small bowel: No dilatation or inflammation. --Colon: No focal abnormality. --Appendix: Normal. Vascular/Lymphatic: Atherosclerotic calcification is present within the non-aneurysmal abdominal aorta, without hemodynamically significant stenosis. No abdominal or pelvic lymphadenopathy. Reproductive: Normal prostate and seminal vesicles. Musculoskeletal. No pelvic fractures. Other: None. IMPRESSION: 1. No acute abnormality of the chest, abdomen or pelvis. 2. Cholelithiasis without acute inflammation. Aortic Atherosclerosis (ICD10-I70.0). Electronically Signed   By: Ulyses Jarred M.D.   On: 11/14/2020 02:50    Review Of Systems Constitutional: No fever, chills, chronic weight gain. Eyes: No vision change, wears glasses. No discharge or pain. Ears: No hearing loss, No tinnitus. Respiratory: No asthma, COPD, pneumonias. Positive shortness of breath. No hemoptysis. Cardiovascular: No chest pain, positive palpitation, leg edema. Gastrointestinal: No nausea, vomiting, diarrhea, constipation. No GI bleed. No hepatitis. Genitourinary: No dysuria, hematuria, kidney stone. No incontinance. Neurological: No headache, positive stroke, seizures.  Psychiatry: No psych facility admission for anxiety, depression, suicide. No detox. Skin:  No rash. Musculoskeletal: Positive joint pain, fibromyalgia, neck pain, back pain. Lymphadenopathy: No lymphadenopathy. Hematology: No anemia or easy bruising.   Blood pressure 127/80, pulse 84, temperature 98.2 F (36.8 C), temperature source Oral, resp. rate (!) 22, height 5\' 4"  (1.626 m), weight 91 kg, SpO2 94 %. Body mass index is 34.44 kg/m. General appearance: alert, cooperative, appears stated age and no distress Head: Normocephalic, atraumatic. Eyes: Brown eyes, pink conjunctiva, corneas clear.  Neck: No adenopathy, no carotid bruit, no JVD, supple,  symmetrical, trachea midline and thyroid not enlarged. Resp: Clear to auscultation bilaterally. Cardio: Regular rate and rhythm, S1, S2 normal, II/VI systolic murmur, no click, rub or gallop GI: Soft, non-tender; bowel sounds normal; no organomegaly. Extremities: No edema, cyanosis or clubbing. Skin: Warm and dry.  Neurologic: Alert and oriented X 3. Left sided weakness.  Assessment/Plan Paroxysmal atrial fibrillation, CHA2DS2VASc score of 4 S/P Right MCA stroke with left sided weakness HTN Type 2 DM Obesity Acute kidney failure on CKD  Plan: Continue carvedilol. Add small dose of oral diltiazem. IV heparin followed by oral anticoagulation.  Time spent: Review of old records, Lab, x-rays, EKG, other cardiac tests, examination, discussion with patient/Nurse/Doctor/Family over 70 minutes.  Birdie Riddle, MD  11/14/2020, 2:00 PM

## 2020-11-14 NOTE — Plan of Care (Addendum)
55 yo M with h/o CVA, L hemiparesis, rectal CA.  Recently diagnosed with gout flareup in knee.  In to ED with c/o L torso pain.  Has A.Fib RVR.  Has AKI which is getting him admitted.  On cardizem.  For AF.  CT unimpressive.  TRH will assume care on arrival to accepting facility. Until arrival, care as per EDP. However, TRH available 24/7 for questions and assistance.  Nursing staff, please page [place] and Consults [phone]) as soon as the patient arrives the hospital.

## 2020-11-14 NOTE — Plan of Care (Signed)
Pt's Heart rate has improved and he has converted to NSR. Cardizem converted to PO and Coreg given today. He remains on heparin gtt. Mild edema noted to BLE. Limited movement to left side of body d/t previous stroke. He intermittently has abdominal and leg pain. PO intake and UO is adequate no BM for this shift. PRN pain meds available. CHG bath given. No areas of concern noted on skin assessment. Of note pt had IV placed with [place] and RN recommended PICC line if pt will need long hospitalization. He has poor vasculature.  Report will be given to nightshift. Safety rounding completed.   Problem: Education: Goal: Knowledge of General Education information will improve Description: Including pain rating scale, medication(s)/side effects and non-pharmacologic comfort measures Outcome: Progressing   Problem: Health Behavior/Discharge Planning: Goal: Ability to manage health-related needs will improve Outcome: Progressing   Problem: Clinical Measurements: Goal: Ability to maintain clinical measurements within normal limits will improve Outcome: Progressing Goal: Will remain free from infection Outcome: Progressing Goal: Diagnostic test results will improve Outcome: Progressing Goal: Respiratory complications will improve Outcome: Progressing Goal: Cardiovascular complication will be avoided Outcome: Progressing

## 2020-11-14 NOTE — Progress Notes (Signed)
Pt is new admission as of this morning. He was previously on cardizem gtt for control of Afib but IV site RA infiltrated (Ultra sound pending for RUE). Dr. Harvest Forest paged and consulted for PO medication until IV access is obtained. Order was received for carvedilol which pt takes at home.  Pt has been RED MEWS.   Interventions: PO Carvedilol given, IV access x2 obtained with US guidance. Cardizem gtt restarted as well as heparin gtt.   Will continue to monitor closely. Pt's current heart rate is now controlled around 80s bpm. Charge RN made aware.     11/14/20 0900  Vitals  Pulse Rate (!) 138  ECG Heart Rate (!) 147  Resp (!) 27  MEWS COLOR  MEWS Score Color Red  Oxygen Therapy  SpO2 97 %  MEWS Score  MEWS Temp 0  MEWS Systolic 0  MEWS Pulse 3  MEWS RR 2  MEWS LOC 0  MEWS Score 5  Note  Observations (S)  Pt given PO carvedilol home dose

## 2020-11-14 NOTE — H&P (Addendum)
History and Physical    [name] KVQ:[id number] DOB: 10/05/1965 DOA: 11/14/2020  Referring MD/NP/PA: Jennette Kettle, MD PCP: Ladell Pier, MD  Patient coming from: Transfer from [facility] after arriving from home via EMS  Chief Complaint: Left side pain  I have personally briefly reviewed patient's old medical records in [place]   HPI: [name] is a 55 y.o. 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  At baseline he gets around with a wheelchair and is able to transition without assistance.  Last night he was trying to transition from his wheelchair to the bedside commode, but  missed missed the seat causing him to fall.  He did not like hit his head or blackout.  Patient used his life alert button to call for help.  He had recently had flareup of his gout in his knee and elbow for which he had been taking indomethacin 50 mg twice daily for approximately 2-week. He had been referred to Dr. Ardis Hughs of GI due to anemia which was noted during last evaluation with his primary care provider for hemoglobin was 10.6 g/dL with low MCV and MCH.  He notes that he chronically has some lower extremity swelling of his right leg that is unchanged.  His wife had noticed some dark-colored urine recently, but thought it was secondary to an event.  Patient denies having any complaints of chest pain, palpitations, shortness of breath, nausea, vomiting, diarrhea, dark stools, blood in stools, or dysuria.  He had never been told that he had a irregular heart rhythm before.  He has been on full dose aspirin and statin for the history of stroke.  He last had a colonoscopy several years ago back in 2009 when he was discovered to have  rectal cancer.  ED Course: Upon admission into the emergency department patient was seen to be afebrile with heart rates elevated into the 140s in A. fib with RVR, 1632, blood pressure 88/63-127/111, and O2 saturations maintained on room air.  Labs significant for BUN 39, creatinine 2.49, and high-sensitivity troponin 4.  Influenza and COVID-19 screening were both negative.  CT of the chest, abdomen, and pelvis negative for any acute abnormality, but did note cholelithiasis without signs of cholecystitis.  Patient was given 1 L normal saline IV fluids and then placed on a Cardizem drip.  Review of Systems  Constitutional:  Negative for malaise/fatigue and weight loss.  HENT:  Negative for congestion and nosebleeds.   Eyes:  Negative for photophobia and pain.  Respiratory:  Negative for cough and shortness of breath.   Cardiovascular:  Positive for leg swelling. Negative for chest pain and palpitations.  Gastrointestinal:  Negative for abdominal pain, blood in stool and vomiting.  Genitourinary:  Negative for dysuria and frequency.  Musculoskeletal:  Positive for falls and joint pain.  Skin:  Negative for rash.  Neurological:  Positive for speech change (chronic secondary to previous stroke) and focal weakness (Chronic secondary to previous CVA).  Psychiatric/Behavioral:  Negative for substance abuse. The patient does not have insomnia.    Past Medical History:  Diagnosis Date   Colon cancer ([place]) 2009   rectal   Diabetes mellitus without complication ([place])    Gait abnormality 03/31/2018   Hemiparesis and speech and language deficit  as late effects of stroke ([place]) 03/31/2018   Hypertension    Seizure ([place])    Stroke ([place]) 2018    Past Surgical History:  Procedure Laterality Date   APPENDECTOMY     JOINT REPLACEMENT Left 2004   Left hip   TOTAL HIP ARTHROPLASTY       reports that he has never smoked. He has never used smokeless tobacco. He reports that he does not currently use  alcohol. He reports that he does not use drugs.  No Known Allergies  Family History  Problem Relation Age of Onset   Hypertension Mother    Heart disease Mother    Hyperlipidemia Mother    Heart failure Mother    Hypertension Sister    Hypertension Brother    Stroke Father    Hypertension Father    Colon cancer Maternal Grandmother    Diabetes Maternal Grandmother    Esophageal cancer Neg Hx     Prior to Admission medications   Medication Sig Start Date End Date Taking? Authorizing Provider  acetaminophen-codeine (TYLENOL #3) 300-30 MG tablet Take 1 tablet by mouth every 8 (eight) hours as needed for moderate pain. 10/26/20   Ladell Pier, MD  amLODipine (NORVASC) 10 MG tablet TAKE 1 TABLET BY MOUTH EVERY DAY 11/11/20   Ladell Pier, MD  aspirin 325 MG tablet Take 1 tablet (325 mg total) by mouth daily. 12/19/16   Velvet Bathe, MD  atorvastatin (LIPITOR) 80 MG tablet TAKE 1 TABLET BY MOUTH EVERY DAY 08/12/20   Ladell Pier, MD  carvedilol (COREG) 25 MG tablet Take 1 tablet (25 mg total) by mouth 2 (two) times daily with a meal. 06/07/20   Ladell Pier, MD  diclofenac Sodium (VOLTAREN) 1 % GEL Apply 2 g topically 4 (four) times daily. 10/26/20   Ladell Pier, MD  indomethacin (INDOCIN) 50 MG capsule TAKE 1 CAPSULE (50 MG TOTAL) BY MOUTH 2 (TWO) TIMES DAILY WITH A MEAL. 11/11/20   Ladell Pier, MD  Melatonin 3 MG TABS Take 3 tablets (9 mg total) by mouth at bedtime. Patient not taking: Reported on 10/15/2020 01/08/17   Cathlyn Parsons, PA-C  metFORMIN (GLUCOPHAGE) 500 MG tablet TAKE 1 TABLET BY MOUTH EVERY DAY WITH BREAKFAST 11/11/20   Ladell Pier, MD  olmesartan (BENICAR) 20 MG tablet TAKE 1 TABLET BY MOUTH EVERY DAY 11/11/20   Ladell Pier, MD  oxybutynin (DITROPAN) 5 MG tablet TAKE 1 TABLET BY MOUTH TWICE A DAY 08/22/20   Ladell Pier, MD    Physical Exam:  Constitutional: Middle-age male currently in no acute  distress Vitals:   11/14/20 0308 11/14/20 0415 11/14/20 0555 11/14/20 0600  BP: 111/70 100/82 104/68 116/82  Pulse: (!) 116 (!) 119 (!) 104 (!) 118  Resp:  19 (!) 32 (!) 23  Temp:   98.3 F (36.8 C)   TempSrc:   Oral   SpO2: 95% 97% 97% 95%  Weight:      Height:       Eyes: PERRL, lids and conjunctivae normal ENMT: Mucous membranes are moist. Posterior pharynx clear of any exudate or lesions.   Neck: normal, supple, no masses, no thyromegaly Respiratory: clear to auscultation bilaterally, no wheezing, no crackles. Normal respiratory effort. No accessory muscle use.  Cardiovascular: Irregular regular and tachycardic, no murmurs / rubs / gallops.  1+ pitting edema noted of the left lower extremity. 2+ pedal pulses. No carotid bruits.  Tenderness palpation of the left  lower lateral ribs. Abdomen: no tenderness, no masses palpated. No hepatosplenomegaly. Bowel sounds positive.  Musculoskeletal: no clubbing / cyanosis. No joint deformity upper and lower extremities. Good ROM, no contractures. Normal muscle tone.  Skin: No bruising noted on the left side of the chest wall.  Swelling appreciated of the right bicep where previous IV had been placed. Neurologic: CN 2-12 grossly intact.  Left-sided weakness with dysarthria appreciated. Psychiatric: Normal judgment and insight. Alert and oriented x 3. Normal mood.     Labs on Admission: I have personally reviewed following labs and imaging studies  CBC: Recent Labs  Lab 11/14/20 0133  WBC 8.9  NEUTROABS 6.8  HGB 11.3*  HCT 34.9*  MCV 79.7*  PLT 009   Basic Metabolic Panel: Recent Labs  Lab 11/14/20 0133  NA 142  K 3.8  CL 104  CO2 25  GLUCOSE 172*  BUN 39*  CREATININE 2.49*  CALCIUM 9.8   GFR: Estimated Creatinine Clearance: 34.5 mL/min (A) (by C-G formula based on SCr of 2.49 mg/dL (H)). Liver Function Tests: Recent Labs  Lab 11/14/20 0133  AST 19  ALT 16  ALKPHOS 65  BILITOT 0.9  PROT 8.4*  ALBUMIN 4.0   No  results for input(s): LIPASE, AMYLASE in the last 168 hours. No results for input(s): AMMONIA in the last 168 hours. Coagulation Profile: No results for input(s): INR, PROTIME in the last 168 hours. Cardiac Enzymes: No results for input(s): CKTOTAL, CKMB, CKMBINDEX, TROPONINI in the last 168 hours. BNP (last 3 results) No results for input(s): PROBNP in the last 8760 hours. HbA1C: No results for input(s): HGBA1C in the last 72 hours. CBG: No results for input(s): GLUCAP in the last 168 hours. Lipid Profile: No results for input(s): CHOL, HDL, LDLCALC, TRIG, CHOLHDL, LDLDIRECT in the last 72 hours. Thyroid Function Tests: No results for input(s): TSH, T4TOTAL, FREET4, T3FREE, THYROIDAB in the last 72 hours. Anemia Panel: No results for input(s): VITAMINB12, FOLATE, FERRITIN, TIBC, IRON, RETICCTPCT in the last 72 hours. Urine analysis:    Component Value Date/Time   COLORURINE YELLOW 04/21/2019 1344   APPEARANCEUR CLEAR 04/21/2019 1344   LABSPEC 1.015 04/21/2019 1344   PHURINE 5.0 04/21/2019 1344   GLUCOSEU NEGATIVE 04/21/2019 1344   HGBUR NEGATIVE 04/21/2019 1344   BILIRUBINUR NEGATIVE 04/21/2019 1344   KETONESUR NEGATIVE 04/21/2019 1344   PROTEINUR NEGATIVE 04/21/2019 1344   UROBILINOGEN 0.2 07/25/2014 1459   NITRITE NEGATIVE 04/21/2019 1344   LEUKOCYTESUR NEGATIVE 04/21/2019 1344   Sepsis Labs: Recent Results (from the past 240 hour(s))  Resp Panel by RT-PCR (Flu A&B, Covid) Nasopharyngeal Swab     Status: None   Collection Time: 11/14/20  4:02 AM   Specimen: Nasopharyngeal Swab; Nasopharyngeal(NP) swabs in vial transport medium  Result Value Ref Range Status   SARS Coronavirus 2 by RT PCR NEGATIVE NEGATIVE Final    Comment: (NOTE) SARS-CoV-2 target nucleic acids are NOT DETECTED.  The SARS-CoV-2 RNA is generally detectable in upper respiratory specimens during the acute phase of infection. The lowest concentration of SARS-CoV-2 viral copies this assay can detect  is 138 copies/mL. A negative result does not preclude SARS-Cov-2 infection and should not be used as the sole basis for treatment or other patient management decisions. A negative result may occur with  improper specimen collection/handling, submission of specimen other than nasopharyngeal swab, presence of viral mutation(s) within the areas targeted by this assay, and inadequate number of viral copies(<138 copies/mL). A negative result must be combined with clinical observations,  patient history, and epidemiological information. The expected result is Negative.  Fact Sheet for Patients:  EntrepreneurPulse.com.au  Fact Sheet for Healthcare Providers:  IncredibleEmployment.be  This test is no t yet approved or cleared by the [place] FDA and  has been authorized for detection and/or diagnosis of SARS-CoV-2 by FDA under an Emergency Use Authorization (EUA). This EUA will remain  in effect (meaning this test can be used) for the duration of the COVID-19 declaration under Section 564(b)(1) of the Act, 21 U.S.C.section 360bbb-3(b)(1), unless the authorization is terminated  or revoked sooner.       Influenza A by PCR NEGATIVE NEGATIVE Final   Influenza B by PCR NEGATIVE NEGATIVE Final    Comment: (NOTE) The Xpert Xpress SARS-CoV-2/FLU/RSV plus assay is intended as an aid in the diagnosis of influenza from Nasopharyngeal swab specimens and should not be used as a sole basis for treatment. Nasal washings and aspirates are unacceptable for Xpert Xpress SARS-CoV-2/FLU/RSV testing.  Fact Sheet for Patients: EntrepreneurPulse.com.au  Fact Sheet for Healthcare Providers: IncredibleEmployment.be  This test is not yet approved or cleared by the [place] FDA and has been authorized for detection and/or diagnosis of SARS-CoV-2 by FDA under an Emergency Use Authorization (EUA). This EUA will remain in effect  (meaning this test can be used) for the duration of the COVID-19 declaration under Section 564(b)(1) of the Act, 21 U.S.C. section 360bbb-3(b)(1), unless the authorization is terminated or revoked.  Performed at [facility], [address] [place], [place] [address]      Radiological Exams on Admission: CT CHEST ABDOMEN PELVIS WO CONTRAST  Result Date: 11/14/2020 CLINICAL DATA:  Chest trauma EXAM: CT CHEST, ABDOMEN AND PELVIS WITHOUT CONTRAST TECHNIQUE: Multidetector CT imaging of the chest, abdomen and pelvis was performed following the standard protocol without IV contrast. COMPARISON:  None. FINDINGS: CT CHEST FINDINGS Cardiovascular: Heart size is normal without pericardial effusion. The thoracic aorta is normal in course and caliber without dissection, aneurysm, ulceration or intramural hematoma. There is calcific aortic atherosclerosis. Mediastinum/Nodes: No mediastinal hematoma. No mediastinal, hilar or axillary lymphadenopathy. The visualized thyroid and thoracic esophageal course are unremarkable. Lungs/Pleura: No pulmonary contusion, pneumothorax or pleural effusion. The central airways are clear. Musculoskeletal: No acute fracture of the ribs, sternum or the visible portions of clavicles and scapulae. CT ABDOMEN PELVIS FINDINGS Hepatobiliary: No hepatic hematoma or laceration. No biliary dilatation. Cholelithiasis without acute inflammation. Pancreas: Normal contours without ductal dilatation. No peripancreatic fluid collection. Spleen: No splenic laceration or hematoma. Adrenals/Urinary Tract: --Adrenal glands: No adrenal hemorrhage. --Right kidney/ureter: No hydronephrosis or perinephric hematoma. --Left kidney/ureter: No hydronephrosis or perinephric hematoma. --Urinary bladder: Unremarkable. Stomach/Bowel: --Stomach/Duodenum: No hiatal hernia or other gastric abnormality. Normal duodenal course and caliber. --Small bowel: No dilatation or inflammation. --Colon: No  focal abnormality. --Appendix: Normal. Vascular/Lymphatic: Atherosclerotic calcification is present within the non-aneurysmal abdominal aorta, without hemodynamically significant stenosis. No abdominal or pelvic lymphadenopathy. Reproductive: Normal prostate and seminal vesicles. Musculoskeletal. No pelvic fractures. Other: None. IMPRESSION: 1. No acute abnormality of the chest, abdomen or pelvis. 2. Cholelithiasis without acute inflammation. Aortic Atherosclerosis (ICD10-I70.0). Electronically Signed   By: Ulyses Jarred M.D.   On: 11/14/2020 02:50    EKG: Independently reviewed.  A. fib with RVR at 146 bpm  Assessment/Plan A. fib with RVR: Acute.  Patient was found to be in A. fib with RVR with heart rates elevated into the 140s. CHA2DS2-VASc score equal to at least 4 (HTN, CVA, DM).  At home patient had just been  on full dose aspirin.  Given elevated risk patient would benefit from anticoagulation.  However, initial labs noted microcytic hypochromic anemia which would be some concern for the patient having bleeding although he denies any. -Admit to a progressive bed -Continue diltiazem drip -Heparin GTT per pharmacy -Check TSH -Goal potassium at least 4 and magnesium at least 2.  Replace electrolytes as needed -Check echocardiogram -Cardiology consulted, follow-up for any further recommendation  Acute kidney injury: Patient presents with creatinine elevated up to 2.49 with BUN 39.  Baseline creatinine had previously been around 1.2 in May of this year.  CT scan of the abdomen and pelvis did not note any signs of obstruction or inflammation to suggest infection.  He had recently been taking indomethacin for gout flare which is likely contributing to symptoms. -Check urinalysis -Check CK given history of fall -Normal saline IV fluids as 100 mL/h -Hold nephrotoxic agents   Diabetes mellitus type 2: On admission glucose elevated 172.  Last available hemoglobin A1c was 7.2 in 05/2020.  Home  medication regimen includes metformin. -Hypoglycemic protocols -Check hemoglobin A1c -Hold metformin -CBGs before every meal with sensitive SSI  Left flank pain secondary to fall: CT imaging was performed but did not note any acute abnormalities. -Continue Tyleno l#3 as needed for pain  Gout: Prior to arrival.  Patient had reportedly had recent gout flare of his knee and right elbow.  He has been taking indomethacin twice daily for the last 2 weeks. -Discontinue indomethacin due to AKI -Continue with Tylenol #3 as needed  Essential hypertension: Initial blood pressures were noted to be as low as 88/63, but otherwise maintained.  Home blood pressure medications include amlodipine 10 mg daily, Coreg 25 mg daily, olmesartan 20 mg daily. -Held olmesartan due to AKI -Held amlodipine due to initial soft blood pressure -Continue Coreg as tolerated attempts for better rate  Diastolic dysfunction: Chronic.  Patient has pitting edema in the right lower extremity, but no significant JVD appreciated on physical exam.  Patient appears to be euvolemic.  Last echocardiogram noted EF of 60 to 65% with grade 1 diastolic dysfunction back in 12/2016. -Follow-up echocardiogram  Microcytic hypochromic anemia: Hemoglobin 11.3 g/dL with low MCV and MCH.  His PCP had sent him a follow-up with Dr. Ardis Hughs of GI due to this finding earlier last month.  Last visible colonoscopy in 2009 patient was diagnosed with rectal cancer. -Check iron studies  -Continue to monitor blood counts daily -Consider formal consult to GI if hemoglobin does not appear to be stable  History of CVA with residual deficit: After stroke in 2018 patient had residual left-sided weakness with dysarthria. -Hold full dose aspirin -Continue atorvastatin  History of meningioma: Noted to be stable on last CT.  Hyperlipidemia -Continue atorvastatin  Obesity: BMI 34.44 kg/m -Continue to counsel on need dietary and lifestyle modifications  GI  prophylaxis: Protonix DVT prophylaxis: Heparin Code Status: Full Family Communication: Wife updated over the phone Disposition Plan: Hopefully discharge home once medically stable Consults called: Cardiology Admission status: Inpatient require more than 2 midnight stay  Norval Morton MD [facility]   If 7PM-7AM, please contact night-coverage   11/14/2020, 7:17 AM

## 2020-11-14 NOTE — Progress Notes (Signed)
ANTICOAGULATION CONSULT NOTE - Initial Consult  Pharmacy Consult for Heparin Indication: atrial fibrillation  No Known Allergies  Patient Measurements: Height: 5\' 4"  (162.6 cm) Weight: 91 kg (200 lb 9.9 oz) IBW/kg (Calculated) : 59.2 Heparin Dosing Weight: 80 kg  Vital Signs: Temp: 98.3 F (36.8 C) (11/02 0555) Temp Source: Oral (11/02 0555) BP: 126/91 (11/02 0815) Pulse Rate: 118 (11/02 0815)  Labs: Recent Labs    11/14/20 0133 11/14/20 0603  HGB 11.3*  --   HCT 34.9*  --   PLT 274  --   CREATININE 2.49*  --   CKTOTAL  --  707*  TROPONINIHS 4 4    Estimated Creatinine Clearance: 34.5 mL/min (A) (by C-G formula based on SCr of 2.49 mg/dL (H)).   Medical History: Past Medical History:  Diagnosis Date   Colon cancer ([place]) 2009   rectal   Diabetes mellitus without complication ([place])    Gait abnormality 03/31/2018   Hemiparesis and speech and language deficit as late effects of stroke ([place]) 03/31/2018   Hypertension    Seizure ([place])    Stroke ([place]) 2018   Assessment:  55 yr old male with hx CVA with residual left hemiparesis to begin IV heparin for atrial fibrillation. Not on anticoagulation prior to admission.  Hgb 11.3, platelet count 274. Serume creatinine 2.49, unknown baseline but was 1.21 in May 2022.  Goal of Therapy:  Heparin level 0.3-0.7 units/ml Monitor platelets by anticoagulation protocol: Yes   Plan:   Heparin 4000 units IV bolus  Heparin drip to begin at 1150 units/hr  Heparin level ~8 hrs after drip begins.  Daily heparin level and CBC while on heparin.  Arty Baumgartner, RPh 11/14/2020,8:31 AM

## 2020-11-14 NOTE — Progress Notes (Signed)
   11/14/20 0555  Assess: MEWS Score  Temp 98.3 F (36.8 C)  BP 104/68  Pulse Rate (!) 104  ECG Heart Rate (!) 104  Resp (!) 32  Level of Consciousness Alert  SpO2 97 %  O2 Device Room Air  Patient Activity (if Appropriate) In bed  Assess: MEWS Score  MEWS Temp 0  MEWS Systolic 0  MEWS Pulse 1  MEWS RR 2  MEWS LOC 0  MEWS Score 3  MEWS Score Color Yellow  Assess: if the MEWS score is Yellow or Red  Were vital signs taken at a resting state? Yes  Focused Assessment No change from prior assessment  Early Detection of Sepsis Score *See Row Information* Low  MEWS guidelines implemented *See Row Information* Yes  Treat  MEWS Interventions Administered scheduled meds/treatments (increased gtt)  Pain Scale 0-10  Pain Score 0  Take Vital Signs  Increase Vital Sign Frequency  Yellow: Q 2hr X 2 then Q 4hr X 2, if remains yellow, continue Q 4hrs  Escalate  MEWS: Escalate Yellow: discuss with charge nurse/RN and consider discussing with provider and RRT  Notify: Charge Nurse/RN  Name of Charge Nurse/RN Notified Enid Derry  Date Charge Nurse/RN Notified 11/14/20  Time Charge Nurse/RN Notified 0555  Document  Patient Outcome Stabilized after interventions  Progress note created (see row info) Yes

## 2020-11-15 LAB — CK: Total CK: 348 U/L (ref 49–397)

## 2020-11-15 LAB — CBC
HCT: 29 % — ABNORMAL LOW (ref 39.0–52.0)
Hemoglobin: 9.5 g/dL — ABNORMAL LOW (ref 13.0–17.0)
MCH: 26 pg (ref 26.0–34.0)
MCHC: 32.8 g/dL (ref 30.0–36.0)
MCV: 79.5 fL — ABNORMAL LOW (ref 80.0–100.0)
Platelets: 227 10*3/uL (ref 150–400)
RBC: 3.65 MIL/uL — ABNORMAL LOW (ref 4.22–5.81)
RDW: 14.6 % (ref 11.5–15.5)
WBC: 6.5 10*3/uL (ref 4.0–10.5)
nRBC: 0 % (ref 0.0–0.2)

## 2020-11-15 LAB — BASIC METABOLIC PANEL
Anion gap: 9 (ref 5–15)
BUN: 23 mg/dL — ABNORMAL HIGH (ref 6–20)
CO2: 21 mmol/L — ABNORMAL LOW (ref 22–32)
Calcium: 8.4 mg/dL — ABNORMAL LOW (ref 8.9–10.3)
Chloride: 111 mmol/L (ref 98–111)
Creatinine, Ser: 1.4 mg/dL — ABNORMAL HIGH (ref 0.61–1.24)
GFR, Estimated: 60 mL/min — ABNORMAL LOW (ref >60)
Glucose, Bld: 134 mg/dL — ABNORMAL HIGH (ref 70–99)
Potassium: 4.3 mmol/L (ref 3.5–5.1)
Sodium: 141 mmol/L (ref 135–145)

## 2020-11-15 LAB — OCCULT BLOOD X 1 CARD TO LAB, STOOL: Fecal Occult Bld: NEGATIVE

## 2020-11-15 LAB — GLUCOSE, CAPILLARY
Glucose-Capillary: 140 mg/dL — ABNORMAL HIGH (ref 70–99)
Glucose-Capillary: 150 mg/dL — ABNORMAL HIGH (ref 70–99)
Glucose-Capillary: 162 mg/dL — ABNORMAL HIGH (ref 70–99)
Glucose-Capillary: 180 mg/dL — ABNORMAL HIGH (ref 70–99)
Glucose-Capillary: 126 mg/dL — ABNORMAL HIGH (ref 70–99)

## 2020-11-15 LAB — HEPARIN LEVEL (UNFRACTIONATED): Heparin Unfractionated: 0.22 IU/mL — ABNORMAL LOW (ref 0.30–0.70)

## 2020-11-15 MED ORDER — PANTOPRAZOLE SODIUM 40 MG PO TBEC
40.0000 mg | DELAYED_RELEASE_TABLET | Freq: Two times a day (BID) | ORAL | Status: DC
  Administered 2020-11-15 – 2020-11-23 (×17): 40 mg via ORAL
  Filled 2020-11-15 (×17): qty 1

## 2020-11-15 MED ORDER — PEG-KCL-NACL-NASULF-NA ASC-C 100 G PO SOLR
0.5000 | Freq: Once | ORAL | Status: AC
  Administered 2020-11-15: 100 g via ORAL

## 2020-11-15 MED ORDER — DILTIAZEM HCL 60 MG PO TABS: 30.0000 mg | ORAL_TABLET | Freq: Once | ORAL | Status: DC

## 2020-11-15 MED ORDER — PEG-KCL-NACL-NASULF-NA ASC-C 100 G PO SOLR: 1.0000 | Freq: Once | ORAL | Status: DC

## 2020-11-15 MED ORDER — METOCLOPRAMIDE HCL 5 MG/ML IJ SOLN
10.0000 mg | Freq: Four times a day (QID) | INTRAMUSCULAR | Status: AC
  Administered 2020-11-15 (×2): 10 mg via INTRAVENOUS
  Filled 2020-11-15 (×2): qty 2

## 2020-11-15 MED ORDER — DILTIAZEM HCL 60 MG PO TABS: 60.0000 mg | ORAL_TABLET | Freq: Two times a day (BID) | ORAL | Status: DC

## 2020-11-15 MED ORDER — BISACODYL 5 MG PO TBEC
20.0000 mg | DELAYED_RELEASE_TABLET | Freq: Once | ORAL | Status: AC
  Administered 2020-11-15: 20 mg via ORAL
  Filled 2020-11-15: qty 4

## 2020-11-15 MED ORDER — SODIUM CHLORIDE 0.9 % IV SOLN: INTRAVENOUS | Status: AC

## 2020-11-15 MED ORDER — DILTIAZEM HCL 60 MG PO TABS
30.0000 mg | ORAL_TABLET | Freq: Four times a day (QID) | ORAL | Status: DC
  Administered 2020-11-15 – 2020-11-19 (×13): 30 mg via ORAL
  Filled 2020-11-15 (×15): qty 1

## 2020-11-15 MED ORDER — PEG-KCL-NACL-NASULF-NA ASC-C 100 G PO SOLR
0.5000 | Freq: Once | ORAL | Status: AC
  Administered 2020-11-15: 100 g via ORAL
  Filled 2020-11-15: qty 1

## 2020-11-15 MED ORDER — INSULIN ASPART 100 UNIT/ML IJ SOLN
0.0000 [IU] | INTRAMUSCULAR | Status: DC
Start: 2020-11-15 — End: 2020-11-23
  Administered 2020-11-15 (×2): 2 [IU] via SUBCUTANEOUS
  Administered 2020-11-15: 1 [IU] via SUBCUTANEOUS
  Administered 2020-11-16 (×2): 2 [IU] via SUBCUTANEOUS
  Administered 2020-11-16: 1 [IU] via SUBCUTANEOUS
  Administered 2020-11-16 (×2): 2 [IU] via SUBCUTANEOUS
  Administered 2020-11-16 – 2020-11-17 (×4): 1 [IU] via SUBCUTANEOUS
  Administered 2020-11-17: 2 [IU] via SUBCUTANEOUS
  Administered 2020-11-17 – 2020-11-18 (×6): 1 [IU] via SUBCUTANEOUS
  Administered 2020-11-19: 2 [IU] via SUBCUTANEOUS
  Administered 2020-11-19 (×4): 1 [IU] via SUBCUTANEOUS
  Administered 2020-11-20 (×2): 2 [IU] via SUBCUTANEOUS
  Administered 2020-11-20: 1 [IU] via SUBCUTANEOUS
  Administered 2020-11-21: 2 [IU] via SUBCUTANEOUS
  Administered 2020-11-21: 3 [IU] via SUBCUTANEOUS
  Administered 2020-11-21 – 2020-11-23 (×6): 1 [IU] via SUBCUTANEOUS

## 2020-11-15 NOTE — Progress Notes (Signed)
ANTICOAGULATION CONSULT NOTE  Pharmacy Consult for Heparin Indication: atrial fibrillation  No Known Allergies  Patient Measurements: Height: 5\' 4"  (162.6 cm) Weight: 91 kg (200 lb 9.9 oz) IBW/kg (Calculated) : 59.2 Heparin Dosing Weight: 80 kg  Vital Signs: Temp: 99.1 F (37.3 C) (11/02 2245) Temp Source: Oral (11/02 2245) BP: 113/79 (11/02 2245) Pulse Rate: 86 (11/02 2245)  Labs: Recent Labs    11/14/20 0133 11/14/20 0603 11/14/20 0813 11/14/20 1637 11/15/20 0111  HGB 11.3*  --   --   --  9.5*  HCT 34.9*  --   --   --  29.0*  PLT 274  --   --   --  227  HEPARINUNFRC  --   --   --  0.23* 0.22*  CREATININE 2.49*  --   --   --  1.40*  CKTOTAL  --  707*  --   --  348  TROPONINIHS 4 4 4   --   --      Estimated Creatinine Clearance: 61.3 mL/min (A) (by C-G formula based on SCr of 1.4 mg/dL (H)).  Assessment: 55 y.o. male with Afib for heparin  Goal of Therapy:  Heparin level 0.3-0.7 units/ml Monitor platelets by anticoagulation protocol: Yes   Plan:  Increase Heparin 1400 units/hr Check heparin level in 8 hours.  Phillis Knack, PharmD, BCPS

## 2020-11-15 NOTE — Plan of Care (Signed)

## 2020-11-15 NOTE — Consult Note (Signed)
Ref: Ladell Pier, MD   Subjective:  Did not tolerate IV heparin. Apparent upper GI bleed with h/o indomethacin use. Sinus rhythm continues.  Objective:  Vital Signs in the last 24 hours: Temp:  [97.8 F (36.6 C)-99.4 F (37.4 C)] 98.7 F (37.1 C) (11/03 0740) Pulse Rate:  [78-98] 98 (11/03 0740) Cardiac Rhythm: Normal sinus rhythm (11/03 0700) Resp:  [13-25] 24 (11/03 0323) BP: (86-147)/(47-83) 147/83 (11/03 0702) SpO2:  [94 %-99 %] 98 % (11/03 0740) Weight:  [84.2 kg] 84.2 kg (11/03 0616)  Physical Exam: BP Readings from Last 1 Encounters:  11/15/20 (!) 147/83     Wt Readings from Last 1 Encounters:  11/15/20 84.2 kg    Weight change: -6.8 kg Body mass index is 31.86 kg/m. HEENT: [place]/AT, Eyes-Brown, Conjunctiva-Pink, Sclera-Non-icteric Neck: No JVD, No bruit, Trachea midline. Lungs:  Clear, Bilateral. Cardiac:  Regular rhythm, normal S1 and S2, no S3. II/VI systolic murmur. Abdomen:  Soft, non-tender. BS present. Extremities:  No edema present. No cyanosis. No clubbing. CNS: AxOx3, Cranial nerves grossly intact. Left sided weakness. Skin: Warm and dry.   Intake/Output from previous day: 11/02 0701 - 11/03 0700 In: 2812.7 [P.O.:600; I.V.:2212.7] Out: 3100 [Urine:3100]    Lab Results: BMET    Component Value Date/Time   NA 141 11/15/2020 0111   NA 142 11/14/2020 0133   NA 145 (H) 06/07/2020 1019   NA 143 04/21/2019 1049   NA 142 11/11/2018 1400   NA 144 11/23/2017 1706   K 4.3 11/15/2020 0111   K 3.8 11/14/2020 0133   K 4.7 06/07/2020 1019   CL 111 11/15/2020 0111   CL 104 11/14/2020 0133   CL 108 (H) 06/07/2020 1019   CO2 21 (L) 11/15/2020 0111   CO2 25 11/14/2020 0133   CO2 23 06/07/2020 1019   GLUCOSE 134 (H) 11/15/2020 0111   GLUCOSE 172 (H) 11/14/2020 0133   GLUCOSE 136 (H) 06/07/2020 1019   GLUCOSE 144 (H) 04/21/2019 1049   BUN 23 (H) 11/15/2020 0111   BUN 39 (H) 11/14/2020 0133   BUN 17 06/07/2020 1019   BUN 14 04/21/2019 1049   BUN  11 11/11/2018 1400   BUN 13 01/26/2018 1026   CREATININE 1.40 (H) 11/15/2020 0111   CREATININE 2.49 (H) 11/14/2020 0133   CREATININE 1.21 06/07/2020 1019   CALCIUM 8.4 (L) 11/15/2020 0111   CALCIUM 9.8 11/14/2020 0133   CALCIUM 9.5 06/07/2020 1019   GFRNONAA 60 (L) 11/15/2020 0111   GFRNONAA 30 (L) 11/14/2020 0133   GFRNONAA 59 (L) 04/21/2019 1049   GFRNONAA 75 11/11/2018 1400   GFRNONAA 73 01/26/2018 1026   GFRAA >60 04/21/2019 1049   GFRAA 87 11/11/2018 1400   GFRAA 85 01/26/2018 1026   CBC    Component Value Date/Time   WBC 6.5 11/15/2020 0111   RBC 3.65 (L) 11/15/2020 0111   HGB 9.5 (L) 11/15/2020 0111   HGB 10.6 (L) 10/15/2020 1025   HGB 14.2 08/02/2008 1026   HCT 29.0 (L) 11/15/2020 0111   HCT 31.8 (L) 10/15/2020 1025   HCT 41.6 08/02/2008 1026   PLT 227 11/15/2020 0111   PLT 211 10/15/2020 1025   MCV 79.5 (L) 11/15/2020 0111   MCV 78 (L) 10/15/2020 1025   MCV 78.4 (L) 08/02/2008 1026   MCH 26.0 11/15/2020 0111   MCHC 32.8 11/15/2020 0111   RDW 14.6 11/15/2020 0111   RDW 13.6 10/15/2020 1025   RDW 16.3 (H) 08/02/2008 1026   LYMPHSABS 1.3 11/14/2020  0133   LYMPHSABS 1.3 11/11/2018 1400   LYMPHSABS 0.6 (L) 08/02/2008 1026   MONOABS 0.8 11/14/2020 0133   MONOABS 0.2 08/02/2008 1026   EOSABS 0.0 11/14/2020 0133   EOSABS 0.1 11/11/2018 1400   BASOSABS 0.0 11/14/2020 0133   BASOSABS 0.0 11/11/2018 1400   BASOSABS 0.0 08/02/2008 1026   HEPATIC Function Panel Recent Labs    06/07/20 1019 11/14/20 0133  PROT 7.4 8.4*   HEMOGLOBIN A1C No components found for: HGA1C,  MPG CARDIAC ENZYMES Lab Results  Component Value Date   CKTOTAL 348 11/15/2020   BNP No results for input(s): PROBNP in the last 8760 hours. TSH Recent Labs    11/14/20 1246  TSH 1.076   CHOLESTEROL Recent Labs    06/07/20 1019  CHOL 141    Scheduled Meds:  atorvastatin  80 mg Oral Daily   carvedilol  25 mg Oral BID WC   diltiazem  30 mg Oral Q12H   insulin aspart  0-9 Units  Subcutaneous Q4H   oxybutynin  5 mg Oral BID   pantoprazole  40 mg Oral BID   sodium chloride flush  3 mL Intravenous Q12H   Continuous Infusions:  sodium chloride     PRN Meds:.acetaminophen **OR** acetaminophen, acetaminophen-codeine, albuterol  Assessment/Plan:  Paroxysmal atrial fibrillation S/P Right MCA stroke HTN Type 2 DM Obesity Acute kidney failure on CKD, II, improving  Plan: Hold anticoagulation till okay by GI. Then consider low dose Eliquis and increase dose if tolerated or Warfarin with INR near 2.   LOS: 1 day   Time spent including chart review, lab review, examination, discussion with patient/Nurse : 30 min   Dixie Dials  MD  11/15/2020, 10:48 AM

## 2020-11-15 NOTE — Progress Notes (Addendum)
[facility] PROGRESS NOTE    Progress Note  Mark [name]  WUJ:[id number] DOB: [birth year] DOA: 11/14/2020 PCP: Ladell Pier, MD     Brief Narrative:   Mark [name] is an 55 y.o. male past medical history significant for essential hypertension, chronic diastolic heart failure CVA with left residual weakness diabetes mellitus type 2 seizure disorder history of rectal cancer status post resection with radiation and chemotherapy comes in complaining of left-sided pain.  At baseline gets around with a wheelchair last night when he was trying to transition from wheelchair to bed he missed and fell he did not lose consciousness during the ED he was found on A. fib with RVR in 140s blood pressure 86/63-127/111 influenza and COVID were negative CT scan of the chest abdomen and pelvis was negative for any acute abnormalities he was given a liter normal saline and started on Cardizem drip.    Assessment/Plan:   New onset Atrial fibrillation with RVR (HCC) CHADS VASC score at least 4. Usually ranges anywhere from 12-10 on admission 9.5. Check FOBT. Started on admission IV heparin and Cardizem drip. Cardiology has been consulted, 2D echo EF of 60% with grade 1 diastolic dysfunction mildly dilated cavities. Cardiology recommended to transition to oral Coreg and low-dose diltiazem. Discontinue heparin as she is at high risk of bleeding, his wife relates melanotic stools about 5 days prior to admission he has been taking Indocin seen below for details.  Microcytic anemia: Check FOBT's, anemia panel showed ferritin of 388 saturation of 10%. There is a slow gradual decrease in his hemoglobin in the setting of aspirin use and indomethacin.  Discontinue heparin consult GI place him n.p.o. for possible endoscopic procedure. His wife relates melanotic stools about 5 days prior to admission.  Acute kidney injury: With a baseline creatinine around 1.2 on admission 2.9. He has been  taking indomethacin for gout flare and in the setting of ACE inhibitor use. Indomethacin held started on normal saline his creatinine is slowly improving close to baseline.  Diabetes mellitus type 2: With a last A1c of 7.2. Metformin was held, started on sliding scale insulin.  Left flank pain: Secondary to fall CT of chest abdomen pelvis showed [name] acute findings.  History of gout flare: With flare prior to arrival of his right knee and elbow has been taking indomethacin for 2 weeks.  This has been discontinued in the setting of acute kidney injury. Continue Tylenol 3 as needed.  Essential hypertension: Blood pressure low on admission antihypertensive medications were held except for his Coreg he was fluid resuscitated. His blood pressure is now improved.  History of, chronic diastolic dysfunction: Appears euvolemic on physical exam continue Coreg heart ACE inhibitor in the setting of acute kidney injury.  History of CVA: Continue Lipitor. Had not been taking aspirin at home.  History of meningioma: Stable on CT.  Dyslipidemia: Excellent continue statins.  Obesity: Counseling.   DVT prophylaxis: scd Family Communication:wife Status is: Inpatient  Remains inpatient appropriate because: Severity of illness A. fib with RVR with microcytic anemia        Code Status:     Code Status Orders  (From admission, onward)           Start     Ordered   11/14/20 0738  Full code  Continuous        11/14/20 0744           Code Status History     Date Active Date Inactive Code  Status Order ID Comments User Context   12/18/2016 1646 01/08/2017 1441 Full Code [id number]  Elizabeth Sauer Inpatient   12/18/2016 1645 12/18/2016 1646 Full Code [id number]  Cathlyn Parsons, PA-C Inpatient   12/12/2016 2347 12/18/2016 1637 Full Code [id number]  Etta Quill, DO ED   07/22/2014 0303 07/27/2014 1757 Full Code [id number]  Theressa Millard, MD ED         IV  Access:   Peripheral IV   Procedures and diagnostic studies:   ECHOCARDIOGRAM COMPLETE  Result Date: 11/14/2020    ECHOCARDIOGRAM REPORT   Patient Name:   Mark [name] [facility] Date of Exam: 11/14/2020 Medical Rec #:  [id number]         Height:       64.0 in Accession #:    [id number]        Weight:       200.6 lb Date of Birth:  March 17, 1965         BSA:          1.959 m Patient Age:    28 years          BP:           111/81 mmHg Patient Gender: M                 HR:           85 bpm. Exam Location:  Inpatient Procedure: 2D Echo, Cardiac Doppler, Color Doppler and Intracardiac            Opacification Agent Indications:    I48.1 Persistent atrial fibrillation  History:        Patient has [name]                 Risk Factors:Hypertension and Diabetes.  Sonographer:    Glo Herring Referring Phys: [id number] [place]  1. Left ventricular ejection fraction, by estimation, is 60 to 65%. The left ventricle has normal function. The left ventricle has [name] There is mild concentric left ventricular hypertrophy. Left ventricular diastolic parameters are consistent with Grade I diastolic dysfunction (impaired relaxation).  2. Right ventricular systolic function is normal. The right ventricular size is normal.  3. Left atrial size was mildly dilated.  4. Right atrial size was mildly dilated.  5. The mitral valve is normal in structure. Trivial mitral valve regurgitation.  6. The aortic valve is tricuspid. Aortic valve regurgitation is not visualized.  7. The inferior vena cava is normal in size with greater than 50% respiratory variability, suggesting right atrial pressure of 3 mmHg. FINDINGS  Left Ventricle: Left ventricular ejection fraction, by estimation, is 60 to 65%. The left ventricle has normal function. The left ventricle has [name] The left ventricular internal cavity size was normal in  size. There is  mild concentric left ventricular hypertrophy. Left ventricular diastolic parameters are consistent with Grade I diastolic dysfunction (impaired relaxation). Right Ventricle: The right ventricular size is normal. [name] increase in right ventricular wall thickness. Right ventricular systolic function is normal. Left Atrium: Left atrial size was mildly dilated. Right Atrium: Right atrial size was mildly dilated. Pericardium: There is [name] evidence of pericardial effusion. Mitral Valve: The mitral valve is normal in structure. Trivial mitral valve regurgitation. Tricuspid Valve: The tricuspid valve is normal in structure. Tricuspid valve regurgitation is trivial. Aortic Valve: The aortic valve is tricuspid. Aortic valve regurgitation is not visualized.  Aortic valve mean gradient measures 4.5 mmHg. Aortic valve peak gradient measures 8.6 mmHg. Pulmonic Valve: The pulmonic valve was normal in structure. Pulmonic valve regurgitation is not visualized. Aorta: The aortic root is normal in size and structure. Venous: The inferior vena cava is normal in size with greater than 50% respiratory variability, suggesting right atrial pressure of 3 mmHg. IAS/Shunts: The atrial septum is grossly normal.  LEFT VENTRICLE PLAX 2D LVIDd:         4.00 cm Diastology LVIDs:         2.40 cm LV e' medial:    5.77 cm/s LV PW:         1.30 cm LV E/e' medial:  13.4 LV IVS:        1.30 cm LV e' lateral:   9.03 cm/s                        LV E/e' lateral: 8.5  RIGHT VENTRICLE             IVC RV Basal diam:  3.40 cm     IVC diam: 1.10 cm RV S prime:     17.50 cm/s LEFT ATRIUM             Index        RIGHT ATRIUM           Index LA diam:        3.00 cm 1.53 cm/m   RA Area:     15.40 cm LA Vol (A2C):   54.5 ml 27.82 ml/m  RA Volume:   39.30 ml  20.06 ml/m LA Vol (A4C):   25.2 ml 12.86 ml/m LA Biplane Vol: 38.4 ml 19.60 ml/m  AORTIC VALVE                    PULMONIC VALVE AV Vmax:           146.50 cm/s  PV Vmax:       1.03 m/s AV  Vmean:          100.250 cm/s PV Peak grad:  4.2 mmHg AV VTI:            0.242 m AV Peak Grad:      8.6 mmHg AV Mean Grad:      4.5 mmHg LVOT Vmax:         120.00 cm/s LVOT Vmean:        87.500 cm/s LVOT VTI:          0.213 m LVOT/AV VTI ratio: 0.88  AORTA Ao Root diam: 3.10 cm Ao Asc diam:  3.20 cm MITRAL VALVE MV Area (PHT): 3.27 cm    SHUNTS MV Decel Time: 232 msec    Systemic VTI: 0.21 m MV E velocity: 77.10 cm/s MV A velocity: 79.70 cm/s MV E/A ratio:  0.97 Dixie Dials MD Electronically signed by Dixie Dials MD Signature Date/Time: 11/14/2020/12:26:04 PM    Final    CT CHEST ABDOMEN PELVIS WO CONTRAST  Result Date: 11/14/2020 CLINICAL DATA:  Chest trauma EXAM: CT CHEST, ABDOMEN AND PELVIS WITHOUT CONTRAST TECHNIQUE: Multidetector CT imaging of the chest, abdomen and pelvis was performed following the standard protocol without IV contrast. COMPARISON:  None. FINDINGS: CT CHEST FINDINGS Cardiovascular: Heart size is normal without pericardial effusion. The thoracic aorta is normal in course and caliber without dissection, aneurysm, ulceration or intramural hematoma. There is calcific aortic atherosclerosis. Mediastinum/Nodes: [name] mediastinal hematoma. [name] mediastinal, hilar or axillary  lymphadenopathy. The visualized thyroid and thoracic esophageal course are unremarkable. Lungs/Pleura: [name] pulmonary contusion, pneumothorax or pleural effusion. The central airways are clear. Musculoskeletal: [name] acute fracture of the ribs, sternum or the visible portions of clavicles and scapulae. CT ABDOMEN PELVIS FINDINGS Hepatobiliary: [name] hepatic hematoma or laceration. [name] biliary dilatation. Cholelithiasis without acute inflammation. Pancreas: Normal contours without ductal dilatation. [name] peripancreatic fluid collection. Spleen: [name] splenic laceration or hematoma. Adrenals/Urinary Tract: --Adrenal glands: [name] adrenal hemorrhage. --Right kidney/ureter: [name] hydronephrosis or perinephric hematoma. --Left kidney/ureter: [name]  hydronephrosis or perinephric hematoma. --Urinary bladder: Unremarkable. Stomach/Bowel: --Stomach/Duodenum: [name] hiatal hernia or other gastric abnormality. Normal duodenal course and caliber. --Small bowel: [name] dilatation or inflammation. --Colon: [name] focal abnormality. --Appendix: Normal. Vascular/Lymphatic: Atherosclerotic calcification is present within the non-aneurysmal abdominal aorta, without hemodynamically significant stenosis. [name] abdominal or pelvic lymphadenopathy. Reproductive: Normal prostate and seminal vesicles. Musculoskeletal. [name] pelvic fractures. Other: None. IMPRESSION: 1. [name] acute abnormality of the chest, abdomen or pelvis. 2. Cholelithiasis without acute inflammation. Aortic Atherosclerosis (ICD10-I70.0). Electronically Signed   By: Ulyses Jarred M.D.   On: 11/14/2020 02:50     Medical Consultants:   None.   Subjective:    Mark [name] abdominal pain.  Objective:    Vitals:   11/15/20 0323 11/15/20 0616 11/15/20 0702 11/15/20 0740  BP: 128/75  (!) 147/83   Pulse: 87  97 98  Resp: (!) 24     Temp: 99.4 F (37.4 C)   98.7 F (37.1 C)  TempSrc: Oral   Oral  SpO2: 94%   98%  Weight:  84.2 kg    Height:       SpO2: 98 % O2 Flow Rate (L/min): 0 L/min FiO2 (%): 21 %   Intake/Output Summary (Last 24 hours) at 11/15/2020 0809 Last data filed at 11/15/2020 0616 Gross per 24 hour  Intake 2572.7 ml  Output 3100 ml  Net -527.3 ml   Filed Weights   11/13/20 2111 11/15/20 0616  Weight: 91 kg 84.2 kg    Exam: General exam: In [name] acute distress. Respiratory system: Good air movement and clear to auscultation. Cardiovascular system: S1 & S2 heard, RRR. [name] JVD, murmurs, rubs, gallops or clicks.  Gastrointestinal system: Abdomen is nondistended, soft and nontender.  Extremities: [name] pedal edema. Skin: [name] rashes, lesions or ulcers Psychiatry: Judgement and insight appear normal. Mood & affect appropriate.    Data Reviewed:    Labs: Basic Metabolic  Panel: Recent Labs  Lab 11/14/20 0133 11/14/20 0603 11/15/20 0111  NA 142  --  141  K 3.8  --  4.3  CL 104  --  111  CO2 25  --  21*  GLUCOSE 172*  --  134*  BUN 39*  --  23*  CREATININE 2.49*  --  1.40*  CALCIUM 9.8  --  8.4*  MG  --  2.4  --    GFR Estimated Creatinine Clearance: 59 mL/min (A) (by C-G formula based on SCr of 1.4 mg/dL (H)). Liver Function Tests: Recent Labs  Lab 11/14/20 0133  AST 19  ALT 16  ALKPHOS 65  BILITOT 0.9  PROT 8.4*  ALBUMIN 4.0   [name] results for input(s): LIPASE, AMYLASE in the last 168 hours. [name] results for input(s): AMMONIA in the last 168 hours. Coagulation profile [name] results for input(s): INR, PROTIME in the last 168 hours. COVID-19 Labs  Recent Labs    11/14/20 1246  FERRITIN 388*    Lab Results  Component Value Date  SARSCOV2NAA NEGATIVE 11/14/2020   SARSCOV2NAA POSITIVE (A) 06/21/2020   [place] Not Detected 12/04/2018    CBC: Recent Labs  Lab 11/14/20 0133 11/15/20 0111  WBC 8.9 6.5  NEUTROABS 6.8  --   HGB 11.3* 9.5*  HCT 34.9* 29.0*  MCV 79.7* 79.5*  PLT 274 227   Cardiac Enzymes: Recent Labs  Lab 11/14/20 0603 11/15/20 0111  CKTOTAL 707* 348   BNP (last 3 results) [name] results for input(s): PROBNP in the last 8760 hours. CBG: Recent Labs  Lab 11/14/20 0841 11/14/20 1108 11/14/20 1525 11/14/20 2114  GLUCAP 128* 180* 136* 142*   D-Dimer: [name] results for input(s): DDIMER in the last 72 hours. Hgb A1c: Recent Labs    11/14/20 0603  HGBA1C 6.7*   Lipid Profile: [name] results for input(s): CHOL, HDL, LDLCALC, TRIG, CHOLHDL, LDLDIRECT in the last 72 hours. Thyroid function studies: Recent Labs    11/14/20 1246  TSH 1.076   Anemia work up: Recent Labs    11/14/20 1246  FERRITIN 388*  TIBC 211*  IRON 22*   Sepsis Labs: Recent Labs  Lab 11/14/20 0133 11/15/20 0111  WBC 8.9 6.5   Microbiology Recent Results (from the past 240 hour(s))  Resp Panel by RT-PCR (Flu A&B, Covid)  Nasopharyngeal Swab     Status: None   Collection Time: 11/14/20  4:02 AM   Specimen: Nasopharyngeal Swab; Nasopharyngeal(NP) swabs in vial transport medium  Result Value Ref Range Status   SARS Coronavirus 2 by RT PCR NEGATIVE NEGATIVE Final    Comment: (NOTE) SARS-CoV-2 target nucleic acids are NOT DETECTED.  The SARS-CoV-2 RNA is generally detectable in upper respiratory specimens during the acute phase of infection. The lowest concentration of SARS-CoV-2 viral copies this assay can detect is 138 copies/mL. A negative result does not preclude SARS-Cov-2 infection and should not be used as the sole basis for treatment or other patient management decisions. A negative result may occur with  improper specimen collection/handling, submission of specimen other than nasopharyngeal swab, presence of viral mutation(s) within the areas targeted by this assay, and inadequate number of viral copies(<138 copies/mL). A negative result must be combined with clinical observations, patient history, and epidemiological information. The expected result is Negative.  Fact Sheet for Patients:  EntrepreneurPulse.com.au  Fact Sheet for Healthcare Providers:  IncredibleEmployment.be  This test is [name] t yet approved or cleared by the [place] FDA and  has been authorized for detection and/or diagnosis of SARS-CoV-2 by FDA under an Emergency Use Authorization (EUA). This EUA will remain  in effect (meaning this test can be used) for the duration of the COVID-19 declaration under Section 564(b)(1) of the Act, 21 U.S.C.section 360bbb-3(b)(1), unless the authorization is terminated  or revoked sooner.       Influenza A by PCR NEGATIVE NEGATIVE Final   Influenza B by PCR NEGATIVE NEGATIVE Final    Comment: (NOTE) The Xpert Xpress SARS-CoV-2/FLU/RSV plus assay is intended as an aid in the diagnosis of influenza from Nasopharyngeal swab specimens and should not be  used as a sole basis for treatment. Nasal washings and aspirates are unacceptable for Xpert Xpress SARS-CoV-2/FLU/RSV testing.  Fact Sheet for Patients: EntrepreneurPulse.com.au  Fact Sheet for Healthcare Providers: IncredibleEmployment.be  This test is not yet approved or cleared by the [place] FDA and has been authorized for detection and/or diagnosis of SARS-CoV-2 by FDA under an Emergency Use Authorization (EUA). This EUA will remain in effect (meaning this test can be used) for the duration of  the COVID-19 declaration under Section 564(b)(1) of the Act, 21 U.S.C. section 360bbb-3(b)(1), unless the authorization is terminated or revoked.  Performed at [facility], [address] [place], [place] [address]   MRSA Next Gen by PCR, Nasal     Status: None   Collection Time: 11/14/20  5:38 AM   Specimen: Nasal Mucosa; Nasal Swab  Result Value Ref Range Status   MRSA by PCR Next Gen NOT DETECTED NOT DETECTED Final    Comment: (NOTE) The GeneXpert MRSA Assay (FDA approved for NASAL specimens only), is one component of a comprehensive MRSA colonization surveillance program. It is not intended to diagnose MRSA infection nor to guide or monitor treatment for MRSA infections. Test performance is not FDA approved in patients less than 23 years old. Performed at [facility], [place] [address], [place], [place] [address]      Medications:    atorvastatin  80 mg Oral Daily   carvedilol  25 mg Oral BID WC   diltiazem  30 mg Oral Q12H   insulin aspart  0-9 Units Subcutaneous TID WC   oxybutynin  5 mg Oral BID   pantoprazole  40 mg Oral Daily   sodium chloride flush  3 mL Intravenous Q12H   Continuous Infusions:  sodium chloride 100 mL/hr at 11/15/20 0558   heparin 1,400 Units/hr (11/15/20 0500)      LOS: 1 day   Charlynne Cousins  [facility]  11/15/2020, 8:09 AM

## 2020-11-15 NOTE — Consult Note (Signed)
[facility] Consult: 10:34 AM 11/15/2020  LOS: 1 day    Referring Provider: Dr Aileen Fass.    Primary Care Physician:  Ladell Pier, MD Primary Gastroenterologist:  Dr schooler >> Ardis Hughs.     Wife at bedside during encounter  Reason for Consultation:  anemia.     HPI: [name] is a 55 y.o. male.  Pmh Wheelchair-bound after CVA with hemiparesis, expressive aphasia 2020.  NIDDM.  On daily 325 ASA.  Gout 2009 transanal resection of large rectal mass/polyp, removed 4.5 cm T3 adenocarcinoma within TVA polyp, margins free of tumor. Referred to Dr. Ardis Hughs and seen in April 2021 regarding surveillance colonoscopy.  No colonoscopy since prior to surgery in 2009.  Procedure scheduled for May 2021 but had to be canceled because patient had not had preoperative COVID testing, nor was he aware of the colonoscopy arrangement.  No interaction between patient and GI since early April 2021 until this past month.   Presented to the ED 2 days ago in the evening.  Several days of gouty R knee pain which caused him to fall to floor while transferring from wheelchair.  After the fall developed stabbing left rib and abdominal pain.  Hgb was 11.8 in late May 2022, 10.6 a month ago.  11.3 early yesterday morning, 9.5 this morning.  MCV persistently low.  Iron low at 22.  TIBC and iron sats also low.  Ferritin 388. AKI, improved.  No previous CKD. CK total 707 .Marland Kitchen  348.  Troponins not elevated CT chest/abdomen/pelvis w/O contrast: No acute findings, uncomplicated cholelithiasis.  Small and large intestines, stomach normal.  Patient had dark, hard stools.  Did not see blood.  After couple of days stools turned back to normal formed, brown.  Good appetite.  No dysphagia.  Has never seen any blood per rectum.  PCP  discontinued indomethacin in early October after noting the decline in Hgb to 10.6.  Office visit with Dr. Ardis Hughs arranged for 11/2 but family rescheduled this for 11/19 as it would have been too difficult to get him to the appointment given his knee pain was affecting his mobility.  Currently using Tylenol for gout pain.  No family history of colorectal disease or cancer, peptic ulcer disease, anemia. Lives at home with his wife.  No tobacco products.  No alcohol products.    Past Medical History:  Diagnosis Date   Colon cancer ([place]) 2009   rectal   Diabetes mellitus without complication ([place])    Gait abnormality 03/31/2018   Hemiparesis and speech and language deficit as late effects of stroke ([place]) 03/31/2018   Hypertension    Seizure ([place])    Stroke ([place]) 2018    Past Surgical History:  Procedure Laterality Date   APPENDECTOMY     JOINT REPLACEMENT Left 2004   Left hip   TOTAL HIP ARTHROPLASTY      Prior to Admission medications   Medication Sig Start Date End Date Taking? Authorizing Provider  acetaminophen (TYLENOL) 500 MG tablet Take 1,000 mg by mouth every 6 (six)  hours as needed for mild pain.   Yes [provider]  amLODipine (NORVASC) 10 MG tablet TAKE 1 TABLET BY MOUTH EVERY DAY Patient taking differently: Take 10 mg by mouth daily. 11/11/20  Yes Ladell Pier, MD  atorvastatin (LIPITOR) 80 MG tablet TAKE 1 TABLET BY MOUTH EVERY DAY Patient taking differently: Take 80 mg by mouth daily. 08/12/20  Yes Ladell Pier, MD  carvedilol (COREG) 25 MG tablet Take 1 tablet (25 mg total) by mouth 2 (two) times daily with a meal. 06/07/20  Yes Ladell Pier, MD  diclofenac Sodium (VOLTAREN) 1 % GEL Apply 2 g topically 4 (four) times daily. Patient taking differently: Apply 2 g topically 2 (two) times daily as needed (pain). 10/26/20  Yes Ladell Pier, MD  metFORMIN (GLUCOPHAGE) 500 MG tablet TAKE 1 TABLET BY MOUTH EVERY DAY WITH BREAKFAST Patient taking  differently: Take 500 mg by mouth daily with breakfast. 11/11/20  Yes Ladell Pier, MD  olmesartan (BENICAR) 20 MG tablet TAKE 1 TABLET BY MOUTH EVERY DAY Patient taking differently: Take 20 mg by mouth daily. 11/11/20  Yes Ladell Pier, MD  oxybutynin (DITROPAN) 5 MG tablet TAKE 1 TABLET BY MOUTH TWICE A DAY 08/22/20  Yes Ladell Pier, MD  acetaminophen-codeine (TYLENOL #3) 300-30 MG tablet Take 1 tablet by mouth every 8 (eight) hours as needed for moderate pain. Patient not taking: Reported on 11/14/2020 10/26/20   Ladell Pier, MD  aspirin 325 MG tablet Take 1 tablet (325 mg total) by mouth daily. Patient not taking: No sig reported 12/19/16   Velvet Bathe, MD    Scheduled Meds:  atorvastatin  80 mg Oral Daily   carvedilol  25 mg Oral BID WC   diltiazem  30 mg Oral Q12H   insulin aspart  0-9 Units Subcutaneous Q4H   oxybutynin  5 mg Oral BID   pantoprazole  40 mg Oral BID   sodium chloride flush  3 mL Intravenous Q12H   Infusions:  sodium chloride     PRN Meds: acetaminophen **OR** acetaminophen, acetaminophen-codeine, albuterol   Allergies as of 11/13/2020   (No Known Allergies)    Family History  Problem Relation Age of Onset   Hypertension Mother    Heart disease Mother    Hyperlipidemia Mother    Heart failure Mother    Hypertension Sister    Hypertension Brother    Stroke Father    Hypertension Father    Colon cancer Maternal Grandmother    Diabetes Maternal Grandmother    Esophageal cancer Neg Hx     Social History   Socioeconomic History   Marital status: Married    Spouse name: [name]   Number of children: 1   Years of education: 16   Highest education level: Not on file  Occupational History   Occupation: disabled  Tobacco Use   Smoking status: Never   Smokeless tobacco: Never  Vaping Use   Vaping Use: Never used  Substance and Sexual Activity   Alcohol use: Not Currently   Drug use: No   Sexual activity: Not Currently   Other Topics Concern   Not on file  Social History Narrative   Patient drinks caffeine a couple times a week.   Patient is right handed.   Lives with wife [name]) and daughter   Social Determinants of Health   Financial Resource Strain: Not on file  Food Insecurity: Not on file  Transportation Needs: Not on file  Physical  Activity: Not on file  Stress: Not on file  Social Connections: Not on file  Intimate Partner Violence: Not on file    REVIEW OF SYSTEMS: Constitutional: No new weakness.  No fatigue. ENT:  No nose bleeds Pulm: No difficulty breathing or cough CV:  No palpitations, no LE edema.  No chest pain GU:  No hematuria, no frequency GI: Per HPI Heme: No excessive or unusual bleeding or bruising. Transfusions: None. Neuro:  No headaches, no peripheral tingling or numbness Derm:  No itching, no rash or sores.  Endocrine:  No sweats or chills.  No polyuria or dysuria Immunization: Reviewed. Travel:  None beyond local counties in last few months.    PHYSICAL EXAM: Vital signs in last 24 hours: Vitals:   11/15/20 0702 11/15/20 0740  BP: (!) 147/83   Pulse: 97 98  Resp:    Temp:  98.7 F (37.1 C)  SpO2:  98%   Wt Readings from Last 3 Encounters:  11/15/20 84.2 kg  05/02/20 91 kg  04/16/20 91.5 kg    General: Obese, pleasant, comfortable.  Looks somewhat chronically ill but not acutely ill. Head: No facial asymmetry or swelling.  No signs of head trauma. Eyes: No conjunctival pallor or scleral icterus.  EOMI Ears: Not hard of hearing Nose: No congestion or discharge Mouth: Oral mucosa is moist, pink, clear.  Tongue midline.  Good dentition. Neck: No JVD, no masses, no thyromegaly. Lungs: Clear to auscultation and percussion bilaterally.  No labored breathing.  No cough. Heart: RRR.  No MRG.  S1, S2 present. Abdomen: Soft, nondistended, nontender.  Active bowel sounds.  No HSM, bruits, hernias..   Rectal: Deferred. Musc/Skeltl: Slight swelling in  the right knee without erythema or increased temperature to touch Extremities: Slight, nonpitting bilateral lower extremity/pedal edema. Neurologic: Alert.  Oriented x3.  Appropriate.  Maintains arousal without issues.  Expressive aphasia with slow but accurate speech and a bit of difficulty word finding.  Left sided weakness. Skin: Dermatitis suggestive of stasis dermatitis in the feet bilaterally. Tattoos: None observed Nodes: No cervical adenopathy Psych: Calm, pleasant, cooperative.  Intake/Output from previous day: 11/02 0701 - 11/03 0700 In: 2812.7 [P.O.:600; I.V.:2212.7] Out: 3100 [Urine:3100] Intake/Output this shift: Total I/O In: 692.4 [P.O.:240; I.V.:452.4] Out: 650 [Urine:650]  LAB RESULTS: Recent Labs    11/14/20 0133 11/15/20 0111  WBC 8.9 6.5  HGB 11.3* 9.5*  HCT 34.9* 29.0*  PLT 274 227   BMET Lab Results  Component Value Date   NA 141 11/15/2020   NA 142 11/14/2020   NA 145 (H) 06/07/2020   K 4.3 11/15/2020   K 3.8 11/14/2020   K 4.7 06/07/2020   CL 111 11/15/2020   CL 104 11/14/2020   CL 108 (H) 06/07/2020   CO2 21 (L) 11/15/2020   CO2 25 11/14/2020   CO2 23 06/07/2020   GLUCOSE 134 (H) 11/15/2020   GLUCOSE 172 (H) 11/14/2020   GLUCOSE 136 (H) 06/07/2020   BUN 23 (H) 11/15/2020   BUN 39 (H) 11/14/2020   BUN 17 06/07/2020   CREATININE 1.40 (H) 11/15/2020   CREATININE 2.49 (H) 11/14/2020   CREATININE 1.21 06/07/2020   CALCIUM 8.4 (L) 11/15/2020   CALCIUM 9.8 11/14/2020   CALCIUM 9.5 06/07/2020   LFT Recent Labs    11/14/20 0133  PROT 8.4*  ALBUMIN 4.0  AST 19  ALT 16  ALKPHOS 65  BILITOT 0.9   PT/INR No results found for: INR, PROTIME Hepatitis Panel No results for input(s):  HEPBSAG, HCVAB, HEPAIGM, HEPBIGM in the last 72 hours. C-Diff No components found for: CDIFF Lipase  No results found for: LIPASE  Drugs of Abuse     Component Value Date/Time   LABOPIA NONE DETECTED 12/13/2016 0133   COCAINSCRNUR NONE DETECTED  12/13/2016 0133   LABBENZ NONE DETECTED 12/13/2016 0133   AMPHETMU NONE DETECTED 12/13/2016 0133   THCU NONE DETECTED 12/13/2016 0133   LABBARB NONE DETECTED 12/13/2016 0133     RADIOLOGY STUDIES: ECHOCARDIOGRAM COMPLETE  Result Date: 11/14/2020    ECHOCARDIOGRAM REPORT   Patient Name:   DARRIOUS YOUMAN [facility] Date of Exam: 11/14/2020 Medical Rec #:  [id number]         Height:       64.0 in Accession #:    [id number]        Weight:       200.6 lb Date of Birth:  [birth year]         BSA:          1.959 m Patient Age:    36 years          BP:           111/81 mmHg Patient Gender: M                 HR:           85 bpm. Exam Location:  Inpatient Procedure: 2D Echo, Cardiac Doppler, Color Doppler and Intracardiac            Opacification Agent Indications:    I48.1 Persistent atrial fibrillation  History:        Patient has no prior history of Echocardiogram examinations.                 Risk Factors:Hypertension and Diabetes.  Sonographer:    Glo Herring Referring Phys: [id number] Benjamin  1. Left ventricular ejection fraction, by estimation, is 60 to 65%. The left ventricle has normal function. The left ventricle has no regional wall motion abnormalities. There is mild concentric left ventricular hypertrophy. Left ventricular diastolic parameters are consistent with Grade I diastolic dysfunction (impaired relaxation).  2. Right ventricular systolic function is normal. The right ventricular size is normal.  3. Left atrial size was mildly dilated.  4. Right atrial size was mildly dilated.  5. The mitral valve is normal in structure. Trivial mitral valve regurgitation.  6. The aortic valve is tricuspid. Aortic valve regurgitation is not visualized.  7. The inferior vena cava is normal in size with greater than 50% respiratory variability, suggesting right atrial pressure of 3 mmHg. FINDINGS  Left Ventricle: Left ventricular ejection fraction, by estimation, is 60 to 65%. The left ventricle has  normal function. The left ventricle has no regional wall motion abnormalities. The left ventricular internal cavity size was normal in size. There is  mild concentric left ventricular hypertrophy. Left ventricular diastolic parameters are consistent with Grade I diastolic dysfunction (impaired relaxation). Right Ventricle: The right ventricular size is normal. No increase in right ventricular wall thickness. Right ventricular systolic function is normal. Left Atrium: Left atrial size was mildly dilated. Right Atrium: Right atrial size was mildly dilated. Pericardium: There is no evidence of pericardial effusion. Mitral Valve: The mitral valve is normal in structure. Trivial mitral valve regurgitation. Tricuspid Valve: The tricuspid valve is normal in structure. Tricuspid valve regurgitation is trivial. Aortic Valve: The aortic valve is tricuspid. Aortic valve regurgitation is not visualized. Aortic valve mean gradient measures 4.5 mmHg.  Aortic valve peak gradient measures 8.6 mmHg. Pulmonic Valve: The pulmonic valve was normal in structure. Pulmonic valve regurgitation is not visualized. Aorta: The aortic root is normal in size and structure. Venous: The inferior vena cava is normal in size with greater than 50% respiratory variability, suggesting right atrial pressure of 3 mmHg. IAS/Shunts: The atrial septum is grossly normal.  LEFT VENTRICLE PLAX 2D LVIDd:         4.00 cm Diastology LVIDs:         2.40 cm LV e' medial:    5.77 cm/s LV PW:         1.30 cm LV E/e' medial:  13.4 LV IVS:        1.30 cm LV e' lateral:   9.03 cm/s                        LV E/e' lateral: 8.5  RIGHT VENTRICLE             IVC RV Basal diam:  3.40 cm     IVC diam: 1.10 cm RV S prime:     17.50 cm/s LEFT ATRIUM             Index        RIGHT ATRIUM           Index LA diam:        3.00 cm 1.53 cm/m   RA Area:     15.40 cm LA Vol (A2C):   54.5 ml 27.82 ml/m  RA Volume:   39.30 ml  20.06 ml/m LA Vol (A4C):   25.2 ml 12.86 ml/m LA Biplane  Vol: 38.4 ml 19.60 ml/m  AORTIC VALVE                    PULMONIC VALVE AV Vmax:           146.50 cm/s  PV Vmax:       1.03 m/s AV Vmean:          100.250 cm/s PV Peak grad:  4.2 mmHg AV VTI:            0.242 m AV Peak Grad:      8.6 mmHg AV Mean Grad:      4.5 mmHg LVOT Vmax:         120.00 cm/s LVOT Vmean:        87.500 cm/s LVOT VTI:          0.213 m LVOT/AV VTI ratio: 0.88  AORTA Ao Root diam: 3.10 cm Ao Asc diam:  3.20 cm MITRAL VALVE MV Area (PHT): 3.27 cm    SHUNTS MV Decel Time: 232 msec    Systemic VTI: 0.21 m MV E velocity: 77.10 cm/s MV A velocity: 79.70 cm/s MV E/A ratio:  0.97 Dixie Dials MD Electronically signed by Dixie Dials MD Signature Date/Time: 11/14/2020/12:26:04 PM    Final    CT CHEST ABDOMEN PELVIS WO CONTRAST  Result Date: 11/14/2020 CLINICAL DATA:  Chest trauma EXAM: CT CHEST, ABDOMEN AND PELVIS WITHOUT CONTRAST TECHNIQUE: Multidetector CT imaging of the chest, abdomen and pelvis was performed following the standard protocol without IV contrast. COMPARISON:  None. FINDINGS: CT CHEST FINDINGS Cardiovascular: Heart size is normal without pericardial effusion. The thoracic aorta is normal in course and caliber without dissection, aneurysm, ulceration or intramural hematoma. There is calcific aortic atherosclerosis. Mediastinum/Nodes: No mediastinal hematoma. No mediastinal, hilar or axillary lymphadenopathy. The visualized thyroid and thoracic esophageal  course are unremarkable. Lungs/Pleura: No pulmonary contusion, pneumothorax or pleural effusion. The central airways are clear. Musculoskeletal: No acute fracture of the ribs, sternum or the visible portions of clavicles and scapulae. CT ABDOMEN PELVIS FINDINGS Hepatobiliary: No hepatic hematoma or laceration. No biliary dilatation. Cholelithiasis without acute inflammation. Pancreas: Normal contours without ductal dilatation. No peripancreatic fluid collection. Spleen: No splenic laceration or hematoma. Adrenals/Urinary Tract:  --Adrenal glands: No adrenal hemorrhage. --Right kidney/ureter: No hydronephrosis or perinephric hematoma. --Left kidney/ureter: No hydronephrosis or perinephric hematoma. --Urinary bladder: Unremarkable. Stomach/Bowel: --Stomach/Duodenum: No hiatal hernia or other gastric abnormality. Normal duodenal course and caliber. --Small bowel: No dilatation or inflammation. --Colon: No focal abnormality. --Appendix: Normal. Vascular/Lymphatic: Atherosclerotic calcification is present within the non-aneurysmal abdominal aorta, without hemodynamically significant stenosis. No abdominal or pelvic lymphadenopathy. Reproductive: Normal prostate and seminal vesicles. Musculoskeletal. No pelvic fractures. Other: None. IMPRESSION: 1. No acute abnormality of the chest, abdomen or pelvis. 2. Cholelithiasis without acute inflammation. Aortic Atherosclerosis (ICD10-I70.0). Electronically Signed   By: Ulyses Jarred M.D.   On: 11/14/2020 02:50      IMPRESSION:     Iron def anemia.    Adenocarcinoma in situ in rectal polyp, transanal excision 2009.  No subsequent surveillance colonoscopies.     Wheelchair-bound following stroke.  Gout.  Currently experiencing flare with pain in right knee.  This caused him to fall out of the wheelchair while transferring and subsequent left-sided flank/abdominal pain/discomfort.  CT reassuring for no gross trauma, intra-abdominal/retroperitoneal bleeding.  Uncomplicated cholelithiasis per CT.  LFTs are normal.  Initial elevation of CK at 707, now normalized 348.  High-sensitivity troponins normal.    PLAN:     EGD and colonoscopy set for tomorrow afternoon.  Please see orders for prep, clear liquids.  Patient and his wife happy to proceed with studies.  Would have been difficult for him to obtain adequate bowel prep at home.   Azucena Freed  11/15/2020, 10:34 AM Phone [phone]

## 2020-11-16 ENCOUNTER — Ambulatory Visit: Payer: BC Managed Care – PPO | Admitting: Internal Medicine

## 2020-11-16 ENCOUNTER — Inpatient Hospital Stay (HOSPITAL_COMMUNITY): Payer: BC Managed Care – PPO

## 2020-11-16 ENCOUNTER — Other Ambulatory Visit (HOSPITAL_COMMUNITY): Payer: Self-pay

## 2020-11-16 LAB — CBC
HCT: 32.2 % — ABNORMAL LOW (ref 39.0–52.0)
Hemoglobin: 10.8 g/dL — ABNORMAL LOW (ref 13.0–17.0)
MCH: 26.1 pg (ref 26.0–34.0)
MCHC: 33.5 g/dL (ref 30.0–36.0)
MCV: 77.8 fL — ABNORMAL LOW (ref 80.0–100.0)
Platelets: 256 10*3/uL (ref 150–400)
RBC: 4.14 MIL/uL — ABNORMAL LOW (ref 4.22–5.81)
RDW: 14.6 % (ref 11.5–15.5)
WBC: 9.2 10*3/uL (ref 4.0–10.5)
nRBC: 0 % (ref 0.0–0.2)

## 2020-11-16 LAB — GLUCOSE, CAPILLARY
Glucose-Capillary: 159 mg/dL — ABNORMAL HIGH (ref 70–99)
Glucose-Capillary: 151 mg/dL — ABNORMAL HIGH (ref 70–99)
Glucose-Capillary: 182 mg/dL — ABNORMAL HIGH (ref 70–99)
Glucose-Capillary: 161 mg/dL — ABNORMAL HIGH (ref 70–99)
Glucose-Capillary: 148 mg/dL — ABNORMAL HIGH (ref 70–99)
Glucose-Capillary: 190 mg/dL — ABNORMAL HIGH (ref 70–99)
Glucose-Capillary: 141 mg/dL — ABNORMAL HIGH (ref 70–99)

## 2020-11-16 LAB — BASIC METABOLIC PANEL
Anion gap: 10 (ref 5–15)
BUN: 13 mg/dL (ref 6–20)
CO2: 22 mmol/L (ref 22–32)
Calcium: 8.6 mg/dL — ABNORMAL LOW (ref 8.9–10.3)
Chloride: 106 mmol/L (ref 98–111)
Creatinine, Ser: 1.19 mg/dL (ref 0.61–1.24)
GFR, Estimated: >60 mL/min (ref >60)
Glucose, Bld: 159 mg/dL — ABNORMAL HIGH (ref 70–99)
Potassium: 3.7 mmol/L (ref 3.5–5.1)
Sodium: 138 mmol/L (ref 135–145)

## 2020-11-16 LAB — MAGNESIUM: Magnesium: 1.7 mg/dL (ref 1.7–2.4)

## 2020-11-16 MED ORDER — GERHARDT'S BUTT CREAM
TOPICAL_CREAM | CUTANEOUS | Status: DC | PRN
  Filled 2020-11-16: qty 1

## 2020-11-16 MED ORDER — DILTIAZEM LOAD VIA INFUSION
10.0000 mg | Freq: Once | INTRAVENOUS | Status: AC
  Administered 2020-11-16: 10 mg via INTRAVENOUS
  Filled 2020-11-16: qty 10

## 2020-11-16 MED ORDER — COLCHICINE 0.6 MG PO TABS
0.6000 mg | ORAL_TABLET | Freq: Two times a day (BID) | ORAL | Status: DC
  Administered 2020-11-16 – 2020-11-17 (×3): 0.6 mg via ORAL
  Filled 2020-11-16 (×4): qty 1

## 2020-11-16 MED ORDER — AMIODARONE HCL IN DEXTROSE 360-4.14 MG/200ML-% IV SOLN
30.0000 mg/h | INTRAVENOUS | Status: AC
  Administered 2020-11-16 – 2020-11-17 (×2): 30 mg/h via INTRAVENOUS
  Filled 2020-11-16 (×2): qty 200

## 2020-11-16 MED ORDER — DILTIAZEM HCL-DEXTROSE 125-5 MG/125ML-% IV SOLN (PREMIX)
5.0000 mg/h | INTRAVENOUS | Status: DC
  Administered 2020-11-16: 5 mg/h via INTRAVENOUS
  Filled 2020-11-16 (×2): qty 125

## 2020-11-16 MED ORDER — BISACODYL 5 MG PO TBEC: 20.0000 mg | DELAYED_RELEASE_TABLET | Freq: Once | ORAL | Status: DC

## 2020-11-16 MED ORDER — AMIODARONE HCL IN DEXTROSE 360-4.14 MG/200ML-% IV SOLN
60.0000 mg/h | INTRAVENOUS | Status: AC
  Administered 2020-11-16 (×2): 60 mg/h via INTRAVENOUS
  Filled 2020-11-16 (×2): qty 200

## 2020-11-16 MED ORDER — LORAZEPAM 2 MG/ML IJ SOLN
0.5000 mg | Freq: Four times a day (QID) | INTRAMUSCULAR | Status: DC | PRN
  Administered 2020-11-16: 0.5 mg via INTRAVENOUS
  Filled 2020-11-16: qty 1

## 2020-11-16 MED ORDER — AMIODARONE LOAD VIA INFUSION
150.0000 mg | Freq: Once | INTRAVENOUS | Status: AC
  Administered 2020-11-16: 150 mg via INTRAVENOUS
  Filled 2020-11-16: qty 83.34

## 2020-11-16 MED ORDER — MAGNESIUM SULFATE 2 GM/50ML IV SOLN
2.0000 g | Freq: Once | INTRAVENOUS | Status: AC
  Administered 2020-11-16: 2 g via INTRAVENOUS
  Filled 2020-11-16: qty 50

## 2020-11-16 NOTE — Consult Note (Signed)
Ref: Ladell Pier, MD   Subjective:  Awake. Now on amiodarone for return of atrial fibrillation with RVR.  Objective:  Vital Signs in the last 24 hours: Temp:  [98.9 F (37.2 C)-100.8 F (38.2 C)] 100.4 F (38 C) (11/04 0256) Pulse Rate:  [86-168] 86 (11/04 1645) Cardiac Rhythm: Atrial fibrillation (11/04 1229) Resp:  [15-30] 29 (11/04 1645) BP: (122-159)/(76-95) 124/77 (11/04 1645) SpO2:  [90 %-97 %] 90 % (11/04 1645) Weight:  [82.9 kg] 82.9 kg (11/04 0606)  Physical Exam: BP Readings from Last 1 Encounters:  11/16/20 124/77     Wt Readings from Last 1 Encounters:  11/16/20 82.9 kg    Weight change: -1.3 kg Body mass index is 31.37 kg/m. HEENT: [place]/AT, Eyes-Brown, Conjunctiva-Pink, Sclera-Non-icteric Neck: No JVD, No bruit, Trachea midline. Lungs:  Clear, Bilateral. Cardiac:  Regular rhythm, normal S1 and S2, no S3. II/VI systolic murmur. Abdomen:  Soft, non-tender. BS present. Extremities:  No edema present. No cyanosis. No clubbing. CNS: AxOx3, Cranial nerves grossly intact. Left sided weakness.  Skin: Warm and dry.   Intake/Output from previous day: 11/03 0701 - 11/04 0700 In: 2076.7 [P.O.:240; I.V.:1836.7] Out: 2100 [Urine:2100]    Lab Results: BMET    Component Value Date/Time   NA 138 11/16/2020 0111   NA 141 11/15/2020 0111   NA 142 11/14/2020 0133   NA 145 (H) 06/07/2020 1019   NA 142 11/11/2018 1400   NA 144 11/23/2017 1706   K 3.7 11/16/2020 0111   K 4.3 11/15/2020 0111   K 3.8 11/14/2020 0133   CL 106 11/16/2020 0111   CL 111 11/15/2020 0111   CL 104 11/14/2020 0133   CO2 22 11/16/2020 0111   CO2 21 (L) 11/15/2020 0111   CO2 25 11/14/2020 0133   GLUCOSE 159 (H) 11/16/2020 0111   GLUCOSE 134 (H) 11/15/2020 0111   GLUCOSE 172 (H) 11/14/2020 0133   BUN 13 11/16/2020 0111   BUN 23 (H) 11/15/2020 0111   BUN 39 (H) 11/14/2020 0133   BUN 17 06/07/2020 1019   BUN 11 11/11/2018 1400   BUN 13 01/26/2018 1026   CREATININE 1.19 11/16/2020  0111   CREATININE 1.40 (H) 11/15/2020 0111   CREATININE 2.49 (H) 11/14/2020 0133   CALCIUM 8.6 (L) 11/16/2020 0111   CALCIUM 8.4 (L) 11/15/2020 0111   CALCIUM 9.8 11/14/2020 0133   GFRNONAA >60 11/16/2020 0111   GFRNONAA 60 (L) 11/15/2020 0111   GFRNONAA 30 (L) 11/14/2020 0133   GFRAA >60 04/21/2019 1049   GFRAA 87 11/11/2018 1400   GFRAA 85 01/26/2018 1026   CBC    Component Value Date/Time   WBC 9.2 11/16/2020 0111   RBC 4.14 (L) 11/16/2020 0111   HGB 10.8 (L) 11/16/2020 0111   HGB 10.6 (L) 10/15/2020 1025   HGB 14.2 08/02/2008 1026   HCT 32.2 (L) 11/16/2020 0111   HCT 31.8 (L) 10/15/2020 1025   HCT 41.6 08/02/2008 1026   PLT 256 11/16/2020 0111   PLT 211 10/15/2020 1025   MCV 77.8 (L) 11/16/2020 0111   MCV 78 (L) 10/15/2020 1025   MCV 78.4 (L) 08/02/2008 1026   MCH 26.1 11/16/2020 0111   MCHC 33.5 11/16/2020 0111   RDW 14.6 11/16/2020 0111   RDW 13.6 10/15/2020 1025   RDW 16.3 (H) 08/02/2008 1026   LYMPHSABS 1.3 11/14/2020 0133   LYMPHSABS 1.3 11/11/2018 1400   LYMPHSABS 0.6 (L) 08/02/2008 1026   MONOABS 0.8 11/14/2020 0133   MONOABS 0.2 08/02/2008 1026  EOSABS 0.0 11/14/2020 0133   EOSABS 0.1 11/11/2018 1400   BASOSABS 0.0 11/14/2020 0133   BASOSABS 0.0 11/11/2018 1400   BASOSABS 0.0 08/02/2008 1026   HEPATIC Function Panel Recent Labs    06/07/20 1019 11/14/20 0133  PROT 7.4 8.4*   HEMOGLOBIN A1C No components found for: HGA1C,  MPG CARDIAC ENZYMES Lab Results  Component Value Date   CKTOTAL 348 11/15/2020   BNP No results for input(s): PROBNP in the last 8760 hours. TSH Recent Labs    11/14/20 1246  TSH 1.076   CHOLESTEROL Recent Labs    06/07/20 1019  CHOL 141    Scheduled Meds:  atorvastatin  80 mg Oral Daily   [START ON 11/17/2020] bisacodyl  20 mg Oral Once   carvedilol  25 mg Oral BID WC   colchicine  0.6 mg Oral BID   diltiazem  30 mg Oral Q6H   insulin aspart  0-9 Units Subcutaneous Q4H   oxybutynin  5 mg Oral BID    pantoprazole  40 mg Oral BID   sodium chloride flush  3 mL Intravenous Q12H   Continuous Infusions:  amiodarone 30 mg/hr (11/16/20 1630)   diltiazem (CARDIZEM) infusion 15 mg/hr (11/16/20 0717)   PRN Meds:.acetaminophen **OR** acetaminophen, acetaminophen-codeine, albuterol, Gerhardt's butt cream, LORazepam  Assessment/Plan:  Atrial fibrillation with RVR S/P Right MCA stroke Acute GI bleed HTN Type 2 DM Obesity CKD, II  Plan: Continue rate control with amiodarone. Awaiting GI procedure.   LOS: 2 days   Time spent including chart review, lab review, examination, discussion with patient/Nurse : 30 min   Dixie Dials  MD  11/16/2020, 6:39 PM

## 2020-11-16 NOTE — Progress Notes (Signed)
[facility] PROGRESS NOTE    Progress Note  [name]  NIO:[id number] DOB: 04/12/1965 DOA: 11/14/2020 PCP: Mark Pier, MD     Brief Narrative:   [name] is an 55 y.o. male past medical history significant for essential hypertension, chronic diastolic heart failure CVA with left residual weakness diabetes mellitus type 2 seizure disorder history of rectal cancer status post resection with radiation and chemotherapy comes in complaining of left-sided pain.  At baseline gets around with a wheelchair last night when he was trying to transition from wheelchair to bed he missed and fell he did not lose consciousness during the ED he was found on A. fib with RVR in 140s blood pressure 86/63-127/111 influenza and COVID were negative CT scan of the chest abdomen and pelvis was negative for any acute abnormalities he was given a liter normal saline and started on Cardizem drip.    Assessment/Plan:   New onset Atrial fibrillation with RVR (HCC) CHADS VASC score at least 4. Usually ranges anywhere from 12-10 on admission 9.5. Check FOBT.  FOBT negative. Back in RVR had to be started back on the diltiazem drip, continue Coreg.  Despite this he continues to go at 160 we will go ahead and discussed with cardiology and start him on amiodarone. Cardiology has been consulted 2D echo showed an EF of 81% grade 1 diastolic heart failure.  Microcytic anemia: FOBT negative, GI has been consulted placed n.p.o. for endoscopy and colonoscopy. As he is at melanotic stools that happened about 5 days prior to admission.  Acute kidney injury: With a baseline creatinine around 1.2 on admission 2.9. Resolved with IV fluid hydration creatinine at baseline.  Continue to hold ACE inhibitor.  Diabetes mellitus type 2: Last A1c 6.7 Controlled on minimal sliding scale  Left flank pain: Secondary to fall CT of chest abdomen pelvis showed no acute findings.  History of gout  flare: Still with pain in multiple joints we will start him on colchicine.  Currently on Coreg and diltiazem as blood pressure is holding steady.  Essential hypertension: Blood pressure low on admission antihypertensive medications were held except for his Coreg he was fluid resuscitated. His blood pressure is now improved.  History of, chronic diastolic dysfunction: Appears euvolemic on physical exam continue Coreg.  Continue to hold ACE inhibitor in the setting of acute kidney injury.  History of CVA: Continue Lipitor. Had not been taking aspirin at home.  History of meningioma: Stable on CT.  Dyslipidemia:  Excellent continue statins.  Obesity: Counseling.   DVT prophylaxis: scd Family Communication:wife Status is: Inpatient  Remains inpatient appropriate because: Severity of illness A. fib with RVR with microcytic anemia    Code Status:     Code Status Orders  (From admission, onward)           Start     Ordered   11/14/20 0738  Full code  Continuous        11/14/20 0744           Code Status History     Date Active Date Inactive Code Status Order ID Comments User Context   12/18/2016 1646 01/08/2017 1441 Full Code [id number]  [name] Inpatient   12/18/2016 1645 12/18/2016 1646 Full Code [id number]  Mark Parsons, PA-C Inpatient   12/12/2016 2347 12/18/2016 1637 Full Code [id number]  Mark Quill, DO ED   07/22/2014 0303 07/27/2014 1757 Full Code [id number]  Mark Millard, MD ED  IV Access:   Peripheral IV   Procedures and diagnostic studies:   ECHOCARDIOGRAM COMPLETE  Result Date: 11/14/2020    ECHOCARDIOGRAM REPORT   Patient Name:   [name] [facility] Date of Exam: 11/14/2020 Medical Rec #:  [id number]         Height:       64.0 in Accession #:    [id number]        Weight:       200.6 lb Date of Birth:  [birth year]         BSA:          1.959 m Patient Age:    89 years          BP:           111/81 mmHg Patient  Gender: M                 HR:           85 bpm. Exam Location:  Inpatient Procedure: 2D Echo, Cardiac Doppler, Color Doppler and Intracardiac            Opacification Agent Indications:    I48.1 Persistent atrial fibrillation  History:        Patient has no prior history of Echocardiogram examinations.                 Risk Factors:Hypertension and Diabetes.  Sonographer:    [name] Referring Phys: [id number] [name]  1. Left ventricular ejection fraction, by estimation, is 60 to 65%. The left ventricle has normal function. The left ventricle has no regional wall motion abnormalities. There is mild concentric left ventricular hypertrophy. Left ventricular diastolic parameters are consistent with Grade I diastolic dysfunction (impaired relaxation).  2. Right ventricular systolic function is normal. The right ventricular size is normal.  3. Left atrial size was mildly dilated.  4. Right atrial size was mildly dilated.  5. The mitral valve is normal in structure. Trivial mitral valve regurgitation.  6. The aortic valve is tricuspid. Aortic valve regurgitation is not visualized.  7. The inferior vena cava is normal in size with greater than 50% respiratory variability, suggesting right atrial pressure of 3 mmHg. FINDINGS  Left Ventricle: Left ventricular ejection fraction, by estimation, is 60 to 65%. The left ventricle has normal function. The left ventricle has no regional wall motion abnormalities. The left ventricular internal cavity size was normal in size. There is  mild concentric left ventricular hypertrophy. Left ventricular diastolic parameters are consistent with Grade I diastolic dysfunction (impaired relaxation). Right Ventricle: The right ventricular size is normal. No increase in right ventricular wall thickness. Right ventricular systolic function is normal. Left Atrium: Left atrial size was mildly dilated. Right Atrium: Right atrial size was mildly dilated. Pericardium: There is  no evidence of pericardial effusion. Mitral Valve: The mitral valve is normal in structure. Trivial mitral valve regurgitation. Tricuspid Valve: The tricuspid valve is normal in structure. Tricuspid valve regurgitation is trivial. Aortic Valve: The aortic valve is tricuspid. Aortic valve regurgitation is not visualized. Aortic valve mean gradient measures 4.5 mmHg. Aortic valve peak gradient measures 8.6 mmHg. Pulmonic Valve: The pulmonic valve was normal in structure. Pulmonic valve regurgitation is not visualized. Aorta: The aortic root is normal in size and structure. Venous: The inferior vena cava is normal in size with greater than 50% respiratory variability, suggesting right atrial pressure of 3 mmHg. IAS/Shunts: The atrial septum is grossly normal.  LEFT VENTRICLE PLAX 2D LVIDd:  4.00 cm Diastology LVIDs:         2.40 cm LV e' medial:    5.77 cm/s LV PW:         1.30 cm LV E/e' medial:  13.4 LV IVS:        1.30 cm LV e' lateral:   9.03 cm/s                        LV E/e' lateral: 8.5  RIGHT VENTRICLE             IVC RV Basal diam:  3.40 cm     IVC diam: 1.10 cm RV S prime:     17.50 cm/s LEFT ATRIUM             Index        RIGHT ATRIUM           Index LA diam:        3.00 cm 1.53 cm/m   RA Area:     15.40 cm LA Vol (A2C):   54.5 ml 27.82 ml/m  RA Volume:   39.30 ml  20.06 ml/m LA Vol (A4C):   25.2 ml 12.86 ml/m LA Biplane Vol: 38.4 ml 19.60 ml/m  AORTIC VALVE                    PULMONIC VALVE AV Vmax:           146.50 cm/s  PV Vmax:       1.03 m/s AV Vmean:          100.250 cm/s PV Peak grad:  4.2 mmHg AV VTI:            0.242 m AV Peak Grad:      8.6 mmHg AV Mean Grad:      4.5 mmHg LVOT Vmax:         120.00 cm/s LVOT Vmean:        87.500 cm/s LVOT VTI:          0.213 m LVOT/AV VTI ratio: 0.88  AORTA Ao Root diam: 3.10 cm Ao Asc diam:  3.20 cm MITRAL VALVE MV Area (PHT): 3.27 cm    SHUNTS MV Decel Time: 232 msec    Systemic VTI: 0.21 m MV E velocity: 77.10 cm/s MV A velocity: 79.70 cm/s MV  E/A ratio:  0.97 Dixie Dials MD Electronically signed by Dixie Dials MD Signature Date/Time: 11/14/2020/12:26:04 PM    Final      Medical Consultants:   None.   Subjective:    [name] no symptoms sleeping when I went into the room.  Objective:    Vitals:   11/16/20 0546 11/16/20 0558 11/16/20 0606 11/16/20 0700  BP: (!) 159/95   136/88  Pulse: (!) 168   (!) 147  Resp:  (!) 30    Temp:      TempSrc:      SpO2:      Weight:   82.9 kg   Height:       SpO2: 95 % O2 Flow Rate (L/min): 0 L/min FiO2 (%): 21 %   Intake/Output Summary (Last 24 hours) at 11/16/2020 0925 Last data filed at 11/16/2020 0400 Gross per 24 hour  Intake 1384.27 ml  Output 1450 ml  Net -65.73 ml    Filed Weights   11/13/20 2111 11/15/20 0616 11/16/20 0606  Weight: 91 kg 84.2 kg 82.9 kg    Exam: General exam: In  no acute distress. Respiratory system: Good air movement and clear to auscultation. Cardiovascular system: S1 & S2 heard, RRR. No JVD. Gastrointestinal system: Abdomen is nondistended, soft and nontender.  Extremities: No pedal edema. Skin: No rashes, lesions or ulcers Psychiatry: Judgement and insight appear normal. Mood & affect appropriate.   Data Reviewed:    Labs: Basic Metabolic Panel: Recent Labs  Lab 11/14/20 0133 11/14/20 0603 11/15/20 0111 11/16/20 0111  NA 142  --  141 138  K 3.8  --  4.3 3.7  CL 104  --  111 106  CO2 25  --  21* 22  GLUCOSE 172*  --  134* 159*  BUN 39*  --  23* 13  CREATININE 2.49*  --  1.40* 1.19  CALCIUM 9.8  --  8.4* 8.6*  MG  --  2.4  --  1.7    GFR Estimated Creatinine Clearance: 69 mL/min (by C-G formula based on SCr of 1.19 mg/dL). Liver Function Tests: Recent Labs  Lab 11/14/20 0133  AST 19  ALT 16  ALKPHOS 65  BILITOT 0.9  PROT 8.4*  ALBUMIN 4.0    No results for input(s): LIPASE, AMYLASE in the last 168 hours. No results for input(s): AMMONIA in the last 168 hours. Coagulation profile No results for  input(s): INR, PROTIME in the last 168 hours. COVID-19 Labs  Recent Labs    11/14/20 1246  FERRITIN 388*     Lab Results  Component Value Date   SARSCOV2NAA NEGATIVE 11/14/2020   SARSCOV2NAA POSITIVE (A) 06/21/2020   [place] Not Detected 12/04/2018    CBC: Recent Labs  Lab 11/14/20 0133 11/15/20 0111 11/16/20 0111  WBC 8.9 6.5 9.2  NEUTROABS 6.8  --   --   HGB 11.3* 9.5* 10.8*  HCT 34.9* 29.0* 32.2*  MCV 79.7* 79.5* 77.8*  PLT 274 227 256    Cardiac Enzymes: Recent Labs  Lab 11/14/20 0603 11/15/20 0111  CKTOTAL 707* 348    BNP (last 3 results) No results for input(s): PROBNP in the last 8760 hours. CBG: Recent Labs  Lab 11/15/20 1508 11/15/20 2033 11/15/20 2305 11/16/20 0255 11/16/20 0805  GLUCAP 162* 180* 126* 159* 151*    D-Dimer: No results for input(s): DDIMER in the last 72 hours. Hgb A1c: Recent Labs    11/14/20 0603  HGBA1C 6.7*    Lipid Profile: No results for input(s): CHOL, HDL, LDLCALC, TRIG, CHOLHDL, LDLDIRECT in the last 72 hours. Thyroid function studies: Recent Labs    11/14/20 1246  TSH 1.076    Anemia work up: Recent Labs    11/14/20 1246  FERRITIN 388*  TIBC 211*  IRON 22*    Sepsis Labs: Recent Labs  Lab 11/14/20 0133 11/15/20 0111 11/16/20 0111  WBC 8.9 6.5 9.2    Microbiology Recent Results (from the past 240 hour(s))  Resp Panel by RT-PCR (Flu A&B, Covid) Nasopharyngeal Swab     Status: None   Collection Time: 11/14/20  4:02 AM   Specimen: Nasopharyngeal Swab; Nasopharyngeal(NP) swabs in vial transport medium  Result Value Ref Range Status   SARS Coronavirus 2 by RT PCR NEGATIVE NEGATIVE Final    Comment: (NOTE) SARS-CoV-2 target nucleic acids are NOT DETECTED.  The SARS-CoV-2 RNA is generally detectable in upper respiratory specimens during the acute phase of infection. The lowest concentration of SARS-CoV-2 viral copies this assay can detect is 138 copies/mL. A negative result does not  preclude SARS-Cov-2 infection and should not be used as the sole basis for  treatment or other patient management decisions. A negative result may occur with  improper specimen collection/handling, submission of specimen other than nasopharyngeal swab, presence of viral mutation(s) within the areas targeted by this assay, and inadequate number of viral copies(<138 copies/mL). A negative result must be combined with clinical observations, patient history, and epidemiological information. The expected result is Negative.  Fact Sheet for Patients:  EntrepreneurPulse.com.au  Fact Sheet for Healthcare Providers:  IncredibleEmployment.be  This test is no t yet approved or cleared by the [place] FDA and  has been authorized for detection and/or diagnosis of SARS-CoV-2 by FDA under an Emergency Use Authorization (EUA). This EUA will remain  in effect (meaning this test can be used) for the duration of the COVID-19 declaration under Section 564(b)(1) of the Act, 21 U.S.C.section 360bbb-3(b)(1), unless the authorization is terminated  or revoked sooner.       Influenza A by PCR NEGATIVE NEGATIVE Final   Influenza B by PCR NEGATIVE NEGATIVE Final    Comment: (NOTE) The Xpert Xpress SARS-CoV-2/FLU/RSV plus assay is intended as an aid in the diagnosis of influenza from Nasopharyngeal swab specimens and should not be used as a sole basis for treatment. Nasal washings and aspirates are unacceptable for Xpert Xpress SARS-CoV-2/FLU/RSV testing.  Fact Sheet for Patients: EntrepreneurPulse.com.au  Fact Sheet for Healthcare Providers: IncredibleEmployment.be  This test is not yet approved or cleared by the [place] FDA and has been authorized for detection and/or diagnosis of SARS-CoV-2 by FDA under an Emergency Use Authorization (EUA). This EUA will remain in effect (meaning this test can be used) for the  duration of the COVID-19 declaration under Section 564(b)(1) of the Act, 21 U.S.C. section 360bbb-3(b)(1), unless the authorization is terminated or revoked.  Performed at [facility], [address] [place],  [address]   MRSA Next Gen by PCR, Nasal     Status: None   Collection Time: 11/14/20  5:38 AM   Specimen: Nasal Mucosa; Nasal Swab  Result Value Ref Range Status   MRSA by PCR Next Gen NOT DETECTED NOT DETECTED Final    Comment: (NOTE) The GeneXpert MRSA Assay (FDA approved for NASAL specimens only), is one component of a comprehensive MRSA colonization surveillance program. It is not intended to diagnose MRSA infection nor to guide or monitor treatment for MRSA infections. Test performance is not FDA approved in patients less than 86 years old. Performed at [facility], [place] [address], [place], [place] [address]      Medications:    atorvastatin  80 mg Oral Daily   carvedilol  25 mg Oral BID WC   diltiazem  30 mg Oral Q6H   insulin aspart  0-9 Units Subcutaneous Q4H   oxybutynin  5 mg Oral BID   pantoprazole  40 mg Oral BID   sodium chloride flush  3 mL Intravenous Q12H   Continuous Infusions:  diltiazem (CARDIZEM) infusion 15 mg/hr (11/16/20 0717)      LOS: 2 days   Charlynne Cousins  [facility]  11/16/2020, 9:25 AM

## 2020-11-16 NOTE — Progress Notes (Addendum)
Daily Rounding Note  11/16/2020, 1:13 PM  LOS: 2 days   SUBJECTIVE:   Chief complaint:   iron def anemia.     Developed nausea and vomiting with the first liter of bowel prep.  Probably drank about 3/4 of the prep.  Received the 2 doses of IV Reglan to accompany prep.   Did pass a couple of stools, "good sized" per pt.  This morning had PAF with rates 40s, 160s.  Normotensive.  OBJECTIVE:         Vital signs in last 24 hours:    Temp:  [98.9 F (37.2 C)-101 F (38.3 C)] 100.4 F (38 C) (11/04 0256) Pulse Rate:  [98-168] 147 (11/04 0700) Resp:  [15-35] 30 (11/04 0558) BP: (122-159)/(75-95) 136/88 (11/04 0700) SpO2:  [94 %-99 %] 95 % (11/04 0256) Weight:  [82.9 kg] 82.9 kg (11/04 0606) Last BM Date: 11/15/20 Filed Weights   11/13/20 2111 11/15/20 0616 11/16/20 0606  Weight: 91 kg 84.2 kg 82.9 kg   General: Chronically ill-appearing.  Resting comfortably in bed.  Somnolent but arousable, maintains arousal. Heart: Irregularly irregular with rates into the 120s. Chest: Diminished breath sounds on the right anterior. Abdomen: Distended, mildly tense which is different than yesterday.  Not tender.  Some tympanitic bowel sounds. Extremities: Bilateral lower extremity edema.  No change. Neuro/Psych: Oriented x3, expressive aphasia.  Not confused.  Intake/Output from previous day: 11/03 0701 - 11/04 0700 In: 2076.7 [P.O.:240; I.V.:1836.7] Out: 2100 [Urine:2100]  Intake/Output this shift: Total I/O In: 429.3 [I.V.:429.3] Out: -   Lab Results: Recent Labs    11/14/20 0133 11/15/20 0111 11/16/20 0111  WBC 8.9 6.5 9.2  HGB 11.3* 9.5* 10.8*  HCT 34.9* 29.0* 32.2*  PLT 274 227 256   BMET Recent Labs    11/14/20 0133 11/15/20 0111 11/16/20 0111  NA 142 141 138  K 3.8 4.3 3.7  CL 104 111 106  CO2 25 21* 22  GLUCOSE 172* 134* 159*  BUN 39* 23* 13  CREATININE 2.49* 1.40* 1.19  CALCIUM 9.8 8.4* 8.6*    LFT Recent Labs    11/14/20 0133  PROT 8.4*  ALBUMIN 4.0  AST 19  ALT 16  ALKPHOS 65  BILITOT 0.9   PT/INR No results for input(s): LABPROT, INR in the last 72 hours. Hepatitis Panel No results for input(s): HEPBSAG, HCVAB, HEPAIGM, HEPBIGM in the last 72 hours.  Studies/Results: No results found.  Scheduled Meds:  atorvastatin  80 mg Oral Daily   carvedilol  25 mg Oral BID WC   colchicine  0.6 mg Oral BID   diltiazem  30 mg Oral Q6H   insulin aspart  0-9 Units Subcutaneous Q4H   oxybutynin  5 mg Oral BID   pantoprazole  40 mg Oral BID   sodium chloride flush  3 mL Intravenous Q12H   Continuous Infusions:  amiodarone 60 mg/hr (11/16/20 1254)   Followed by   amiodarone     diltiazem (CARDIZEM) infusion 15 mg/hr (11/16/20 0717)   PRN Meds:.acetaminophen **OR** acetaminophen, acetaminophen-codeine, albuterol, Gerhardt's butt cream, LORazepam   ASSESMENT:   Iron deficiency anemia. FOBT negative.  Hgb nadir 9.5 yesterday, 10.8 today.    Transanal excision of adenocarcinoma in situ 2009.  No subsequent surveillance colonoscopy.    Intolerant to first liter of bowel prep.  Abdominal exam with distention today suspicious for ileus.  Wheelchair-bound due to prior stroke.  Gout, acute affecting right knee.   Colchicine  now in place.      A fib, RVR.  New.  Cardizem drip ineffective, rate slowing w new amiodarone gtt. Heparin gtt initiated then stopped as of 0900 yesterday.   No AC PTA.     PLAN   Check KUB to assess BGP  Not clear he is going to be able to complete a full bowel prep so would like to try to perform EGD and colonoscopy tomorrow..  Addendum at 1611 KUB: IMPRESSION: 1. New marked gaseous distention of the stomach.  Azucena Freed  11/16/2020, 1:13 PM Phone [phone]   I have taken a history, reviewed the chart and examined the patient. I performed a substantive portion of this encounter, including complete performance of at least one of the  key components, in conjunction with the APP. I agree with the APP's note, impression and recommendations.   Patient went into an episode of A. fib with RVR earlier today.  This evening his heart rate is back in the 90s.  He was able to drink about 75% of his prep and his stool still appeared brown this morning.  He appears to be more distended today.  Abdomen is tympanic and KUB shows new marked gaseous distention of the stomach.  We will hold off on additional bowel prep this evening and reevaluate tomorrow to see if he would be appropriate for EGD/colonoscopy on Sunday.

## 2020-11-16 NOTE — Progress Notes (Signed)
[facility] night cross cover note:  Regarding this patient who is here for atrial fibrillation with RVR and was previously on diltiazem drip before transitioning to oral diltiazem and beta-blocker, has gone back into atrial fibrillation with RVR versus SVT.  Associated systolic blood pressures tolerating in the 150s to 160s mmHg, O2 sats noted to be 100% on room air.  I have ordered resumption of diltiazem drip with preceding bolus and ordered stat BMP and serum magnesium levels. Repeat ekg ordered as well.      Babs Bertin, DO Hospitalist

## 2020-11-16 NOTE — Progress Notes (Signed)
Converted to NSR on the monitor ~1600, Dr. Aileen Fass made aware.

## 2020-11-17 DIAGNOSIS — R112 Nausea with vomiting, unspecified: Secondary | ICD-10-CM

## 2020-11-17 LAB — CBC
HCT: 30.1 % — ABNORMAL LOW (ref 39.0–52.0)
Hemoglobin: 10.1 g/dL — ABNORMAL LOW (ref 13.0–17.0)
MCH: 25.7 pg — ABNORMAL LOW (ref 26.0–34.0)
MCHC: 33.6 g/dL (ref 30.0–36.0)
MCV: 76.6 fL — ABNORMAL LOW (ref 80.0–100.0)
Platelets: 261 10*3/uL (ref 150–400)
RBC: 3.93 MIL/uL — ABNORMAL LOW (ref 4.22–5.81)
RDW: 14.7 % (ref 11.5–15.5)
WBC: 10.2 10*3/uL (ref 4.0–10.5)
nRBC: 0 % (ref 0.0–0.2)

## 2020-11-17 LAB — GLUCOSE, CAPILLARY
Glucose-Capillary: 136 mg/dL — ABNORMAL HIGH (ref 70–99)
Glucose-Capillary: 189 mg/dL — ABNORMAL HIGH (ref 70–99)
Glucose-Capillary: 136 mg/dL — ABNORMAL HIGH (ref 70–99)
Glucose-Capillary: 147 mg/dL — ABNORMAL HIGH (ref 70–99)
Glucose-Capillary: 136 mg/dL — ABNORMAL HIGH (ref 70–99)

## 2020-11-17 LAB — MAGNESIUM: Magnesium: 2.4 mg/dL (ref 1.7–2.4)

## 2020-11-17 MED ORDER — PEG-KCL-NACL-NASULF-NA ASC-C 100 G PO SOLR
0.5000 | Freq: Once | ORAL | Status: AC
  Administered 2020-11-17: 100 g via ORAL
  Filled 2020-11-17: qty 1

## 2020-11-17 MED ORDER — COLCHICINE 0.6 MG PO TABS
0.6000 mg | ORAL_TABLET | Freq: Every day | ORAL | Status: DC
  Administered 2020-11-18 – 2020-11-23 (×6): 0.6 mg via ORAL
  Filled 2020-11-17 (×6): qty 1

## 2020-11-17 MED ORDER — PEG-KCL-NACL-NASULF-NA ASC-C 100 G PO SOLR: 1.0000 | Freq: Once | ORAL | Status: DC

## 2020-11-17 MED ORDER — PEG-KCL-NACL-NASULF-NA ASC-C 100 G PO SOLR
0.5000 | Freq: Once | ORAL | Status: AC
  Administered 2020-11-18: 100 g via ORAL
  Filled 2020-11-17: qty 1

## 2020-11-17 MED ORDER — AMIODARONE HCL 200 MG PO TABS
200.0000 mg | ORAL_TABLET | Freq: Two times a day (BID) | ORAL | Status: DC
  Administered 2020-11-17 – 2020-11-23 (×12): 200 mg via ORAL
  Filled 2020-11-17 (×12): qty 1

## 2020-11-17 NOTE — Progress Notes (Signed)
     [facility] Progress Note  CC:  IDA  Subjective:  Says that his stomach feels fine.  No nausea.  He is just very tired today but states that he wants to try to prep for the EGD and colonoscopy tomorrow.  Objective:  Vital signs in last 24 hours: Temp:  [98.5 F (36.9 C)-102 F (38.9 C)] 99.4 F (37.4 C) (11/05 0740) Pulse Rate:  [85-92] 92 (11/05 0740) Resp:  [16-29] 29 (11/05 0740) BP: (103-127)/(67-82) 124/79 (11/05 0740) SpO2:  [90 %-94 %] 94 % (11/05 0740) Weight:  [85.2 kg] 85.2 kg (11/05 0321) Last BM Date: 11/16/20 General:  Alert, Well-developed, in NAD, sleepy Heart:  Regular rate and rhythm; no murmurs Pulm:  CTAB.  No W/R/R. Abdomen:  Soft, maybe slightly distended but I have not seen him before (he tells me that it does not seem to be).  BS present.  Non-tender. Extremities:  Without edema.  Bandages on both heels.  Intake/Output from previous day: 11/04 0701 - 11/05 0700 In: 1517 [P.O.:480; I.V.:1037] Out: 550 [Urine:550]  Lab Results: Recent Labs    11/15/20 0111 11/16/20 0111 11/17/20 0052  WBC 6.5 9.2 10.2  HGB 9.5* 10.8* 10.1*  HCT 29.0* 32.2* 30.1*  PLT 227 256 261   BMET Recent Labs    11/15/20 0111 11/16/20 0111  NA 141 138  K 4.3 3.7  CL 111 106  CO2 21* 22  GLUCOSE 134* 159*  BUN 23* 13  CREATININE 1.40* 1.19  CALCIUM 8.4* 8.6*   DG Abd Portable 1V  Result Date: 11/16/2020 CLINICAL DATA:  Nausea and vomiting with abdominal distension. EXAM: PORTABLE ABDOMEN - 1 VIEW COMPARISON:  CT chest abdomen and pelvis 11/14/2020. FINDINGS: The stomach is markedly distended and air-filled, a new finding. Air is seen within nondilated large and small bowel to the level of the sigmoid. There is a phlebolith left in the left hemipelvis. Degenerative changes affect the right hip. Left hip arthroplasty is present. IMPRESSION: 1. New marked gaseous distention of the stomach. Electronically Signed   By: Ronney Asters M.D.   On: 11/16/2020  15:54    Assessment / Plan: Iron deficiency anemia. FOBT negative.  Hgb nadir 9.5 yesterday, 10.8 yesterday, 10.1 grams today.     Transanal excision of adenocarcinoma in situ 2009.  No subsequent surveillance colonoscopy.   Intolerant to first liter of bowel prep.  Abdominal exam with distention 11/4 suspicious for ileus and x-ray showed gastric distention.  Feeling better today.   Wheelchair-bound due to prior stroke.   Gout, acute affecting right knee.   Colchicine now in place.     A fib, RVR.  New.  Cardizem drip ineffective, rate slowing w new amiodarone gtt. Heparin gtt initiated then stopped as of 0900 11/3.  No AC PTA.  Now converted back to NSR.  -He wants to try prepping for EGD and colonoscopy tomorrow.  Prep orders placed.  Clear liquids today then NPO after midnight except prep.    LOS: 3 days   Laban Emperor. Wallis Vancott  11/17/2020, 8:52 AM

## 2020-11-17 NOTE — Progress Notes (Signed)
[facility] PROGRESS NOTE    Progress Note  [name]  MEQ:[id number] DOB: 12-Jul-1965 DOA: 11/14/2020 PCP: Ladell Pier, MD     Brief Narrative:   [name] is an 55 y.o. male past medical history significant for essential hypertension, chronic diastolic heart failure CVA with left residual weakness diabetes mellitus type 2 seizure disorder history of rectal cancer status post resection with radiation and chemotherapy comes in complaining of left-sided pain.  At baseline gets around with a wheelchair last night when he was trying to transition from wheelchair to bed he missed and fell he did not lose consciousness during the ED he was found on A. fib with RVR in 140s blood pressure 86/63-127/111 influenza and COVID were negative CT scan of the chest abdomen and pelvis was negative for any acute abnormalities he was given a liter normal saline and started on Cardizem drip.    Assessment/Plan:   New onset Atrial fibrillation with RVR (HCC) CHADS VASC score at least 4. Despite being on max dose of Coreg and IV diltiazem he was in A. fib to be loaded with IV amiodarone. Now back in sinus rhythm. Cardiology has been consulted 2D echo showed an EF of 29% grade 1 diastolic heart failure. Question the trigger of his A. fib was probably due to acute gout flare, he was in SIRS, temperature is coming down we will have to keep an eye on his white blood cell which today is slightly elevated.  Microcytic anemia: FOBT negative, GI has been consulted for endoscopic procedure. Had to be delayed as the patient went into A. fib with RVR. Now in sinus rhythm.  Patient did not finish his bowel prep. Started his bowel prep further management per GI hopefully can proceed with endoscopic intervention on 11/18/2020.  Acute kidney injury: With a baseline creatinine around 1.2 on admission 2.9. In the setting of NSAID and ACE inhibitor. Resolved with fluid resuscitation and holding  nephrotoxic medication.  Diabetes mellitus type 2: Last A1c 6.7 Continue sliding scale required minimal insulin.  Left flank pain: Secondary to fall CT of chest abdomen pelvis showed no acute findings.  History of gout flare: Still with pain in multiple joints we will start him on colchicine.  Currently on Coreg and diltiazem as blood pressure is holding steady.  Essential hypertension: Blood pressure low on admission antihypertensive medications were held except for his Coreg he was fluid resuscitated. His blood pressure is now improved.  History of, chronic diastolic dysfunction: Appears euvolemic on physical exam continue Coreg.  Continue to hold ACE inhibitor in the setting of acute kidney injury.  History of CVA: Continue Lipitor. Had not been taking aspirin at home.  History of meningioma: Stable on CT.  Dyslipidemia:  Excellent continue statins.  Obesity: Counseling.   DVT prophylaxis: scd Family Communication:wife Status is: Inpatient  Remains inpatient appropriate because: Severity of illness A. fib with RVR with microcytic anemia    Code Status:     Code Status Orders  (From admission, onward)           Start     Ordered   11/14/20 0738  Full code  Continuous        11/14/20 0744           Code Status History     Date Active Date Inactive Code Status Order ID Comments User Context   12/18/2016 1646 01/08/2017 1441 Full Code [id number]  Elizabeth Sauer Inpatient   12/18/2016 1645  12/18/2016 1646 Full Code [id number]  Elizabeth Sauer Inpatient   12/12/2016 2347 12/18/2016 1637 Full Code [id number]  Etta Quill, DO ED   07/22/2014 0303 07/27/2014 1757 Full Code [id number]  Theressa Millard, MD ED         IV Access:   Peripheral IV   Procedures and diagnostic studies:   DG Abd Portable 1V  Result Date: 11/16/2020 CLINICAL DATA:  Nausea and vomiting with abdominal distension. EXAM: PORTABLE ABDOMEN - 1 VIEW  COMPARISON:  CT chest abdomen and pelvis 11/14/2020. FINDINGS: The stomach is markedly distended and air-filled, a new finding. Air is seen within nondilated large and small bowel to the level of the sigmoid. There is a phlebolith left in the left hemipelvis. Degenerative changes affect the right hip. Left hip arthroplasty is present. IMPRESSION: 1. New marked gaseous distention of the stomach. Electronically Signed   By: Ronney Asters M.D.   On: 11/16/2020 15:54     Medical Consultants:   None.   Subjective:    [name] feels much better today was not able to finish his bowel prep  Objective:    Vitals:   11/16/20 2027 11/16/20 2315 11/17/20 0321 11/17/20 0740  BP: 108/77 127/82 125/81 124/79  Pulse: 86 90 92 92  Resp: (!) 25 (!) 28 (!) 22 (!) 29  Temp: 98.5 F (36.9 C) 100 F (37.8 C) (!) 100.6 F (38.1 C) 99.4 F (37.4 C)  TempSrc: Oral Oral Oral Oral  SpO2: 90% 91% 94% 94%  Weight:   85.2 kg   Height:       SpO2: 94 % O2 Flow Rate (L/min): 0 L/min FiO2 (%): 21 %   Intake/Output Summary (Last 24 hours) at 11/17/2020 0841 Last data filed at 11/17/2020 0326 Gross per 24 hour  Intake 1517.04 ml  Output 550 ml  Net 967.04 ml    Filed Weights   11/15/20 0616 11/16/20 0606 11/17/20 0321  Weight: 84.2 kg 82.9 kg 85.2 kg    Exam: General exam: In no acute distress. Respiratory system: Good air movement and clear to auscultation. Cardiovascular system: S1 & S2 heard, RRR. No JVD. Gastrointestinal system: Abdomen is nondistended, soft and nontender.  Extremities: No pedal edema. Skin: No rashes, lesions or ulcers Psychiatry: Judgement and insight appear normal. Mood & affect appropriate.   Data Reviewed:    Labs: Basic Metabolic Panel: Recent Labs  Lab 11/14/20 0133 11/14/20 0603 11/15/20 0111 11/16/20 0111 11/17/20 0052  NA 142  --  141 138  --   K 3.8  --  4.3 3.7  --   CL 104  --  111 106  --   CO2 25  --  21* 22  --   GLUCOSE 172*  --   134* 159*  --   BUN 39*  --  23* 13  --   CREATININE 2.49*  --  1.40* 1.19  --   CALCIUM 9.8  --  8.4* 8.6*  --   MG  --  2.4  --  1.7 2.4    GFR Estimated Creatinine Clearance: 69.9 mL/min (by C-G formula based on SCr of 1.19 mg/dL). Liver Function Tests: Recent Labs  Lab 11/14/20 0133  AST 19  ALT 16  ALKPHOS 65  BILITOT 0.9  PROT 8.4*  ALBUMIN 4.0    No results for input(s): LIPASE, AMYLASE in the last 168 hours. No results for input(s): AMMONIA in the last 168 hours. Coagulation profile No results for  input(s): INR, PROTIME in the last 168 hours. COVID-19 Labs  Recent Labs    11/14/20 1246  FERRITIN 388*     Lab Results  Component Value Date   SARSCOV2NAA NEGATIVE 11/14/2020   SARSCOV2NAA POSITIVE (A) 06/21/2020   Jud Not Detected 12/04/2018    CBC: Recent Labs  Lab 11/14/20 0133 11/15/20 0111 11/16/20 0111 11/17/20 0052  WBC 8.9 6.5 9.2 10.2  NEUTROABS 6.8  --   --   --   HGB 11.3* 9.5* 10.8* 10.1*  HCT 34.9* 29.0* 32.2* 30.1*  MCV 79.7* 79.5* 77.8* 76.6*  PLT 274 227 256 261    Cardiac Enzymes: Recent Labs  Lab 11/14/20 0603 11/15/20 0111  CKTOTAL 707* 348    BNP (last 3 results) No results for input(s): PROBNP in the last 8760 hours. CBG: Recent Labs  Lab 11/16/20 1647 11/16/20 1947 11/16/20 2313 11/17/20 0322 11/17/20 0743  GLUCAP 148* 190* 141* 136* 189*    D-Dimer: No results for input(s): DDIMER in the last 72 hours. Hgb A1c: No results for input(s): HGBA1C in the last 72 hours.  Lipid Profile: No results for input(s): CHOL, HDL, LDLCALC, TRIG, CHOLHDL, LDLDIRECT in the last 72 hours. Thyroid function studies: Recent Labs    11/14/20 1246  TSH 1.076    Anemia work up: Recent Labs    11/14/20 1246  FERRITIN 388*  TIBC 211*  IRON 22*    Sepsis Labs: Recent Labs  Lab 11/14/20 0133 11/15/20 0111 11/16/20 0111 11/17/20 0052  WBC 8.9 6.5 9.2 10.2    Microbiology Recent Results (from the past  240 hour(s))  Resp Panel by RT-PCR (Flu A&B, Covid) Nasopharyngeal Swab     Status: None   Collection Time: 11/14/20  4:02 AM   Specimen: Nasopharyngeal Swab; Nasopharyngeal(NP) swabs in vial transport medium  Result Value Ref Range Status   SARS Coronavirus 2 by RT PCR NEGATIVE NEGATIVE Final    Comment: (NOTE) SARS-CoV-2 target nucleic acids are NOT DETECTED.  The SARS-CoV-2 RNA is generally detectable in upper respiratory specimens during the acute phase of infection. The lowest concentration of SARS-CoV-2 viral copies this assay can detect is 138 copies/mL. A negative result does not preclude SARS-Cov-2 infection and should not be used as the sole basis for treatment or other patient management decisions. A negative result may occur with  improper specimen collection/handling, submission of specimen other than nasopharyngeal swab, presence of viral mutation(s) within the areas targeted by this assay, and inadequate number of viral copies(<138 copies/mL). A negative result must be combined with clinical observations, patient history, and epidemiological information. The expected result is Negative.  Fact Sheet for Patients:  EntrepreneurPulse.com.au  Fact Sheet for Healthcare Providers:  IncredibleEmployment.be  This test is no t yet approved or cleared by the [place] FDA and  has been authorized for detection and/or diagnosis of SARS-CoV-2 by FDA under an Emergency Use Authorization (EUA). This EUA will remain  in effect (meaning this test can be used) for the duration of the COVID-19 declaration under Section 564(b)(1) of the Act, 21 U.S.C.section 360bbb-3(b)(1), unless the authorization is terminated  or revoked sooner.       Influenza A by PCR NEGATIVE NEGATIVE Final   Influenza B by PCR NEGATIVE NEGATIVE Final    Comment: (NOTE) The Xpert Xpress SARS-CoV-2/FLU/RSV plus assay is intended as an aid in the diagnosis of influenza  from Nasopharyngeal swab specimens and should not be used as a sole basis for treatment. Nasal washings and aspirates are  unacceptable for Xpert Xpress SARS-CoV-2/FLU/RSV testing.  Fact Sheet for Patients: EntrepreneurPulse.com.au  Fact Sheet for Healthcare Providers: IncredibleEmployment.be  This test is not yet approved or cleared by the [place] FDA and has been authorized for detection and/or diagnosis of SARS-CoV-2 by FDA under an Emergency Use Authorization (EUA). This EUA will remain in effect (meaning this test can be used) for the duration of the COVID-19 declaration under Section 564(b)(1) of the Act, 21 U.S.C. section 360bbb-3(b)(1), unless the authorization is terminated or revoked.  Performed at [facility], [address] [place], [place] [address]   MRSA Next Gen by PCR, Nasal     Status: None   Collection Time: 11/14/20  5:38 AM   Specimen: Nasal Mucosa; Nasal Swab  Result Value Ref Range Status   MRSA by PCR Next Gen NOT DETECTED NOT DETECTED Final    Comment: (NOTE) The GeneXpert MRSA Assay (FDA approved for NASAL specimens only), is one component of a comprehensive MRSA colonization surveillance program. It is not intended to diagnose MRSA infection nor to guide or monitor treatment for MRSA infections. Test performance is not FDA approved in patients less than 11 years old. Performed at [facility], [place] [address], [place], [place] [address]      Medications:    atorvastatin  80 mg Oral Daily   bisacodyl  20 mg Oral Once   carvedilol  25 mg Oral BID WC   colchicine  0.6 mg Oral BID   diltiazem  30 mg Oral Q6H   insulin aspart  0-9 Units Subcutaneous Q4H   oxybutynin  5 mg Oral BID   pantoprazole  40 mg Oral BID   sodium chloride flush  3 mL Intravenous Q12H   Continuous Infusions:  amiodarone 30 mg/hr (11/17/20 0300)   diltiazem (CARDIZEM) infusion 15 mg/hr (11/16/20 0717)       LOS: 3 days   Charlynne Cousins  [facility]  11/17/2020, 8:41 AM

## 2020-11-17 NOTE — H&P (View-Only) (Signed)
     [facility] Progress Note  CC:  IDA  Subjective:  Says that his stomach feels fine.  No nausea.  He is just very tired today but states that he wants to try to prep for the EGD and colonoscopy tomorrow.  Objective:  Vital signs in last 24 hours: Temp:  [98.5 F (36.9 C)-102 F (38.9 C)] 99.4 F (37.4 C) (11/05 0740) Pulse Rate:  [85-92] 92 (11/05 0740) Resp:  [16-29] 29 (11/05 0740) BP: (103-127)/(67-82) 124/79 (11/05 0740) SpO2:  [90 %-94 %] 94 % (11/05 0740) Weight:  [85.2 kg] 85.2 kg (11/05 0321) Last BM Date: 11/16/20 General:  Alert, Well-developed, in NAD, sleepy Heart:  Regular rate and rhythm; no murmurs Pulm:  CTAB.  No W/R/R. Abdomen:  Soft, maybe slightly distended but I have not seen him before (he tells me that it does not seem to be).  BS present.  Non-tender. Extremities:  Without edema.  Bandages on both heels.  Intake/Output from previous day: 11/04 0701 - 11/05 0700 In: 1517 [P.O.:480; I.V.:1037] Out: 550 [Urine:550]  Lab Results: Recent Labs    11/15/20 0111 11/16/20 0111 11/17/20 0052  WBC 6.5 9.2 10.2  HGB 9.5* 10.8* 10.1*  HCT 29.0* 32.2* 30.1*  PLT 227 256 261   BMET Recent Labs    11/15/20 0111 11/16/20 0111  NA 141 138  K 4.3 3.7  CL 111 106  CO2 21* 22  GLUCOSE 134* 159*  BUN 23* 13  CREATININE 1.40* 1.19  CALCIUM 8.4* 8.6*   DG Abd Portable 1V  Result Date: 11/16/2020 CLINICAL DATA:  Nausea and vomiting with abdominal distension. EXAM: PORTABLE ABDOMEN - 1 VIEW COMPARISON:  CT chest abdomen and pelvis 11/14/2020. FINDINGS: The stomach is markedly distended and air-filled, a new finding. Air is seen within nondilated large and small bowel to the level of the sigmoid. There is a phlebolith left in the left hemipelvis. Degenerative changes affect the right hip. Left hip arthroplasty is present. IMPRESSION: 1. New marked gaseous distention of the stomach. Electronically Signed   By: Ronney Asters M.D.   On: 11/16/2020  15:54    Assessment / Plan: Iron deficiency anemia. FOBT negative.  Hgb nadir 9.5 yesterday, 10.8 yesterday, 10.1 grams today.     Transanal excision of adenocarcinoma in situ 2009.  No subsequent surveillance colonoscopy.   Intolerant to first liter of bowel prep.  Abdominal exam with distention 11/4 suspicious for ileus and x-ray showed gastric distention.  Feeling better today.   Wheelchair-bound due to prior stroke.   Gout, acute affecting right knee.   Colchicine now in place.     A fib, RVR.  New.  Cardizem drip ineffective, rate slowing w new amiodarone gtt. Heparin gtt initiated then stopped as of 0900 11/3.  No AC PTA.  Now converted back to NSR.  -He wants to try prepping for EGD and colonoscopy tomorrow.  Prep orders placed.  Clear liquids today then NPO after midnight except prep.    LOS: 3 days   Laban Emperor. Tyon Cerasoli  11/17/2020, 8:52 AM

## 2020-11-17 NOTE — Progress Notes (Signed)
Subjective:  Patient denies any chest pain or shortness of breath.  Remains on amiodarone drip converted back to sinus rhythm.  Objective:  Vital Signs in the last 24 hours: Temp:  [98.5 F (36.9 C)-102 F (38.9 C)] 99.4 F (37.4 C) (11/05 0740) Pulse Rate:  [85-92] 92 (11/05 0740) Resp:  [16-29] 29 (11/05 0740) BP: (103-127)/(67-82) 124/79 (11/05 0740) SpO2:  [90 %-94 %] 94 % (11/05 0740) Weight:  [85.2 kg] 85.2 kg (11/05 0321)  Intake/Output from previous day: 11/04 0701 - 11/05 0700 In: 2876 [P.O.:480; I.V.:1037] Out: 550 [Urine:550] Intake/Output from this shift: No intake/output data recorded.  Physical Exam: Neck: no adenopathy, no carotid bruit, no JVD, and supple, symmetrical, trachea midline Lungs: Clear to auscultation anterolaterally Heart: regular rate and rhythm and S1, S2 normal Abdomen: soft, non-tender; bowel sounds normal; no masses,  no organomegaly Extremities: extremities normal, atraumatic, no cyanosis or edema Neurologic: Motor: 3/5  left-sided paresis noted  Lab Results: Recent Labs    11/16/20 0111 11/17/20 0052  WBC 9.2 10.2  HGB 10.8* 10.1*  PLT 256 261   Recent Labs    11/15/20 0111 11/16/20 0111  NA 141 138  K 4.3 3.7  CL 111 106  CO2 21* 22  GLUCOSE 134* 159*  BUN 23* 13  CREATININE 1.40* 1.19   No results for input(s): TROPONINI in the last 72 hours.  Invalid input(s): CK, MB Hepatic Function Panel No results for input(s): PROT, ALBUMIN, AST, ALT, ALKPHOS, BILITOT, BILIDIR, IBILI in the last 72 hours. No results for input(s): CHOL in the last 72 hours. No results for input(s): PROTIME in the last 72 hours.  Imaging: DG Abd Portable 1V  Result Date: 11/16/2020 CLINICAL DATA:  Nausea and vomiting with abdominal distension. EXAM: PORTABLE ABDOMEN - 1 VIEW COMPARISON:  CT chest abdomen and pelvis 11/14/2020. FINDINGS: The stomach is markedly distended and air-filled, a new finding. Air is seen within nondilated large and small  bowel to the level of the sigmoid. There is a phlebolith left in the left hemipelvis. Degenerative changes affect the right hip. Left hip arthroplasty is present. IMPRESSION: 1. New marked gaseous distention of the stomach. Electronically Signed   By: Ronney Asters M.D.   On: 11/16/2020 15:54    Cardiac Studies:  Assessment/Plan:  Status post atrial fibrillation with RVR CHA2DS2-VASc score of 4 S/P Right MCA stroke with left paresis in the past Acute GI bleed HTN Type 2 DM Obesity CKD, II Plan Continue present management Start on Eliquis once cleared by GI. switch amiodarone to 200 mg p.o. twice daily once present bag is finished.  LOS: 3 days    Charolette Forward 11/17/2020, 9:22 AM

## 2020-11-18 ENCOUNTER — Encounter (HOSPITAL_COMMUNITY)
Admission: EM | Disposition: A | Discharge status: Rehabilitation Facility | Payer: Self-pay | Source: Home / Self Care | Attending: Internal Medicine

## 2020-11-18 ENCOUNTER — Inpatient Hospital Stay (HOSPITAL_COMMUNITY): Payer: BC Managed Care – PPO | Admitting: Anesthesiology

## 2020-11-18 ENCOUNTER — Encounter (HOSPITAL_COMMUNITY): Payer: Self-pay | Admitting: Internal Medicine

## 2020-11-18 DIAGNOSIS — Z86010 Personal history of colonic polyps: Secondary | ICD-10-CM

## 2020-11-18 DIAGNOSIS — K621 Rectal polyp: Secondary | ICD-10-CM

## 2020-11-18 DIAGNOSIS — K2210 Ulcer of esophagus without bleeding: Secondary | ICD-10-CM

## 2020-11-18 DIAGNOSIS — K633 Ulcer of intestine: Secondary | ICD-10-CM

## 2020-11-18 HISTORY — PX: COLONOSCOPY: SHX5424

## 2020-11-18 HISTORY — PX: POLYPECTOMY: SHX5525

## 2020-11-18 HISTORY — PX: ESOPHAGOGASTRODUODENOSCOPY (EGD) WITH PROPOFOL: SHX5813

## 2020-11-18 HISTORY — PX: BIOPSY: SHX5522

## 2020-11-18 LAB — BASIC METABOLIC PANEL
Anion gap: 8 (ref 5–15)
BUN: 18 mg/dL (ref 6–20)
CO2: 22 mmol/L (ref 22–32)
Calcium: 8.3 mg/dL — ABNORMAL LOW (ref 8.9–10.3)
Chloride: 110 mmol/L (ref 98–111)
Creatinine, Ser: 1.34 mg/dL — ABNORMAL HIGH (ref 0.61–1.24)
GFR, Estimated: >60 mL/min (ref >60)
Glucose, Bld: 138 mg/dL — ABNORMAL HIGH (ref 70–99)
Potassium: 3.5 mmol/L (ref 3.5–5.1)
Sodium: 140 mmol/L (ref 135–145)

## 2020-11-18 LAB — CBC
HCT: 30.9 % — ABNORMAL LOW (ref 39.0–52.0)
Hemoglobin: 10.3 g/dL — ABNORMAL LOW (ref 13.0–17.0)
MCH: 26 pg (ref 26.0–34.0)
MCHC: 33.3 g/dL (ref 30.0–36.0)
MCV: 78 fL — ABNORMAL LOW (ref 80.0–100.0)
Platelets: 237 10*3/uL (ref 150–400)
RBC: 3.96 MIL/uL — ABNORMAL LOW (ref 4.22–5.81)
RDW: 15.1 % (ref 11.5–15.5)
WBC: 7.5 10*3/uL (ref 4.0–10.5)
nRBC: 0 % (ref 0.0–0.2)

## 2020-11-18 LAB — GLUCOSE, CAPILLARY
Glucose-Capillary: 131 mg/dL — ABNORMAL HIGH (ref 70–99)
Glucose-Capillary: 123 mg/dL — ABNORMAL HIGH (ref 70–99)
Glucose-Capillary: 132 mg/dL — ABNORMAL HIGH (ref 70–99)
Glucose-Capillary: 121 mg/dL — ABNORMAL HIGH (ref 70–99)
Glucose-Capillary: 102 mg/dL — ABNORMAL HIGH (ref 70–99)
Glucose-Capillary: 134 mg/dL — ABNORMAL HIGH (ref 70–99)
Glucose-Capillary: 124 mg/dL — ABNORMAL HIGH (ref 70–99)

## 2020-11-18 SURGERY — ESOPHAGOGASTRODUODENOSCOPY (EGD) WITH PROPOFOL
Anesthesia: Monitor Anesthesia Care

## 2020-11-18 MED ORDER — PHENYLEPHRINE HCL-NACL 20-0.9 MG/250ML-% IV SOLN
INTRAVENOUS | Status: DC | PRN
  Administered 2020-11-18: 25 ug/min via INTRAVENOUS

## 2020-11-18 MED ORDER — PROPOFOL 10 MG/ML IV BOLUS
INTRAVENOUS | Status: DC | PRN
  Administered 2020-11-18: 50 mg via INTRAVENOUS

## 2020-11-18 MED ORDER — POTASSIUM CHLORIDE CRYS ER 20 MEQ PO TBCR
40.0000 meq | EXTENDED_RELEASE_TABLET | Freq: Once | ORAL | Status: AC
  Administered 2020-11-18: 40 meq via ORAL
  Filled 2020-11-18 (×2): qty 2

## 2020-11-18 MED ORDER — PROPOFOL 500 MG/50ML IV EMUL
INTRAVENOUS | Status: DC | PRN
  Administered 2020-11-18: 100 ug/kg/min via INTRAVENOUS

## 2020-11-18 MED ORDER — SODIUM CHLORIDE 0.9 % IV BOLUS: 500.0000 mL | Freq: Once | INTRAVENOUS | Status: DC

## 2020-11-18 MED ORDER — LIDOCAINE 2% (20 MG/ML) 5 ML SYRINGE
INTRAMUSCULAR | Status: DC | PRN
  Administered 2020-11-18: 40 mg via INTRAVENOUS

## 2020-11-18 MED ORDER — POTASSIUM CHLORIDE CRYS ER 20 MEQ PO TBCR
40.0000 meq | EXTENDED_RELEASE_TABLET | Freq: Once | ORAL | Status: AC
  Administered 2020-11-18: 40 meq via ORAL
  Filled 2020-11-18: qty 2

## 2020-11-18 MED ORDER — PHENYLEPHRINE 40 MCG/ML (10ML) SYRINGE FOR IV PUSH (FOR BLOOD PRESSURE SUPPORT)
PREFILLED_SYRINGE | INTRAVENOUS | Status: DC | PRN
  Administered 2020-11-18 (×5): 80 ug via INTRAVENOUS

## 2020-11-18 MED ORDER — LACTATED RINGERS IV SOLN
INTRAVENOUS | Status: AC | PRN
  Administered 2020-11-18: 10 mL/h via INTRAVENOUS

## 2020-11-18 SURGICAL SUPPLY — 25 items

## 2020-11-18 NOTE — Anesthesia Preprocedure Evaluation (Addendum)
Anesthesia Evaluation  Patient identified by MRN, date of birth, ID band Patient awake    Reviewed: Allergy & Precautions, NPO status , Patient's Chart, lab work & pertinent test results  Airway Mallampati: III  TM Distance: >3 FB Neck ROM: Full    Dental  (+) Teeth Intact, Dental Advisory Given   Pulmonary neg pulmonary ROS,    Pulmonary exam normal breath sounds clear to auscultation       Cardiovascular hypertension, Pt. on home beta blockers and Pt. on medications  Rhythm:Regular Rate:Normal     Neuro/Psych Seizures -,  CVA, Residual Symptoms    GI/Hepatic negative GI ROS, Neg liver ROS,   Endo/Other  negative endocrine ROSdiabetes  Renal/GU Renal disease     Musculoskeletal negative musculoskeletal ROS (+)   Abdominal (+) + obese,   Peds  Hematology  (+) Blood dyscrasia, anemia ,   Anesthesia Other Findings   Reproductive/Obstetrics                            Anesthesia Physical Anesthesia Plan  ASA: 3  Anesthesia Plan: MAC   Post-op Pain Management:    Induction: Intravenous  PONV Risk Score and Plan: 1 and Ondansetron, Propofol infusion, TIVA and Treatment may vary due to age or medical condition  Airway Management Planned: Natural Airway  Additional Equipment:   Intra-op Plan:   Post-operative Plan:   Informed Consent: I have reviewed the patients History and Physical, chart, labs and discussed the procedure including the risks, benefits and alternatives for the proposed anesthesia with the patient or authorized representative who has indicated his/her understanding and acceptance.     Dental advisory given  Plan Discussed with: CRNA  Anesthesia Plan Comments:         Anesthesia Quick Evaluation

## 2020-11-18 NOTE — Transfer of Care (Signed)
Immediate Anesthesia Transfer of Care Note  Patient: [name]  Procedure(s) Performed: ESOPHAGOGASTRODUODENOSCOPY (EGD) WITH PROPOFOL BIOPSY POLYPECTOMY COLONOSCOPY WITH PROPOFOL  Patient Location: PACU  Anesthesia Type:MAC  Level of Consciousness: awake and patient cooperative  Airway & Oxygen Therapy: Patient Spontanous Breathing and Patient connected to nasal cannula oxygen  Post-op Assessment: Report given to RN and Post -op Vital signs reviewed and stable  Post vital signs: Reviewed and stable  Last Vitals:  Vitals Value Taken Time  BP 107/83 11/18/20 1216  Temp 36.8 C 11/18/20 1200  Pulse 80 11/18/20 1217  Resp 34 11/18/20 1217  SpO2 98 % 11/18/20 1217  Vitals shown include unvalidated device data.  Last Pain:  Vitals:   11/18/20 1215  TempSrc:   PainSc: 0-No pain      Patients Stated Pain Goal: 3 (97/41/63 8453)  Complications: No notable events documented.

## 2020-11-18 NOTE — Progress Notes (Signed)
[facility] PROGRESS NOTE    Progress Note  ARTIST BLOOM  LOV:[id number] DOB: 10/06/1965 DOA: 11/14/2020 PCP: Ladell Pier, MD     Brief Narrative:   [name] is an 55 y.o. male past medical history significant for essential hypertension, chronic diastolic heart failure CVA with left residual weakness diabetes mellitus type 2 seizure disorder history of rectal cancer status post resection with radiation and chemotherapy comes in complaining of left-sided pain.  At baseline gets around with a wheelchair last night when he was trying to transition from wheelchair to bed he missed and fell he did not lose consciousness during the ED he was found on A. fib with RVR in 140s blood pressure 86/63-127/111 influenza and COVID were negative CT scan of the chest abdomen and pelvis was negative for any acute abnormalities he was given a liter normal saline and started on Cardizem drip.    Assessment/Plan:   New onset Atrial fibrillation with RVR (HCC) CHADS VASC score at least 4. Now on oral Coreg, diltiazem and amiodarone. Now back in sinus rhythm. Cardiology has been consulted 2D echo showed an EF of 88% grade 1 diastolic heart failure. Question the trigger of his A. fib was probably due to acute gout flare, he was in SIRS. Once cleared by GI after endoscopic procedures will see we get started on a DOAC.  Microcytic anemia: FOBT negative, GI has been consulted for endoscopic procedure. Had to be delayed as the patient went into A. fib with RVR. Now in sinus rhythm, EGD and colonoscopy today 11/18/2020.  Acute kidney injury: With a baseline creatinine around 1.2 on admission 2.9. In the setting of NSAID and ACE inhibitor. Resolved with fluid resuscitation and holding nephrotoxic medication.  Diabetes mellitus type 2: Last A1c 6.7 Continue sliding scale required minimal insulin.  Left flank pain: Secondary to fall CT of chest abdomen pelvis showed no acute  findings.  History of gout flare: Still with pain in multiple joints we will start him on colchicine.  Currently on Coreg and diltiazem as blood pressure is holding steady.  Essential hypertension: Blood pressure low on admission antihypertensive medications were held except for his Coreg he was fluid resuscitated. His blood pressure is now improved.  History of, chronic diastolic dysfunction: Appears euvolemic on physical exam continue Coreg.  Continue to hold ACE inhibitor in the setting of acute kidney injury.  History of CVA: Continue Lipitor. Had not been taking aspirin at home.  History of meningioma: Stable on CT.  Dyslipidemia:  Excellent continue statins.  Obesity: Counseling.   DVT prophylaxis: scd Family Communication:wife Status is: Inpatient  Remains inpatient appropriate because: Severity of illness A. fib with RVR with microcytic anemia    Code Status:     Code Status Orders  (From admission, onward)           Start     Ordered   11/14/20 0738  Full code  Continuous        11/14/20 0744           Code Status History     Date Active Date Inactive Code Status Order ID Comments User Context   12/18/2016 1646 01/08/2017 1441 Full Code [id number]  Elizabeth Sauer Inpatient   12/18/2016 1645 12/18/2016 1646 Full Code [id number]  Cathlyn Parsons, PA-C Inpatient   12/12/2016 2347 12/18/2016 1637 Full Code [id number]  Etta Quill, DO ED   07/22/2014 0303 07/27/2014 1757 Full Code [id number]  Theressa Millard,  MD ED         IV Access:   Peripheral IV   Procedures and diagnostic studies:   DG Abd Portable 1V  Result Date: 11/16/2020 CLINICAL DATA:  Nausea and vomiting with abdominal distension. EXAM: PORTABLE ABDOMEN - 1 VIEW COMPARISON:  CT chest abdomen and pelvis 11/14/2020. FINDINGS: The stomach is markedly distended and air-filled, a new finding. Air is seen within nondilated large and small bowel to the level of the  sigmoid. There is a phlebolith left in the left hemipelvis. Degenerative changes affect the right hip. Left hip arthroplasty is present. IMPRESSION: 1. New marked gaseous distention of the stomach. Electronically Signed   By: Ronney Asters M.D.   On: 11/16/2020 15:54     Medical Consultants:   None.   Subjective:    [name] was able to tolerate the prep overnight.  Objective:    Vitals:   11/17/20 2318 11/18/20 0338 11/18/20 0547 11/18/20 0741  BP: 126/82 126/88  108/61  Pulse: 80 90  82  Resp: (!) 33 (!) 32  (!) 26  Temp: 98.1 F (36.7 C) 99.1 F (37.3 C)  98.5 F (36.9 C)  TempSrc: Oral Oral  Oral  SpO2: 98% 97%  97%  Weight:   85 kg   Height:       SpO2: 97 % O2 Flow Rate (L/min): 0 L/min FiO2 (%): 21 %   Intake/Output Summary (Last 24 hours) at 11/18/2020 0843 Last data filed at 11/18/2020 0630 Gross per 24 hour  Intake 306.16 ml  Output 1200 ml  Net -893.84 ml    Filed Weights   11/16/20 0606 11/17/20 0321 11/18/20 0547  Weight: 82.9 kg 85.2 kg 85 kg    Exam: General exam: In no acute distress. Respiratory system: Good air movement and clear to auscultation. Cardiovascular system: S1 & S2 heard, RRR. No JVD. Gastrointestinal system: Abdomen is nondistended, soft and nontender.  Extremities: No pedal edema. Skin: No rashes, lesions or ulcers   Data Reviewed:    Labs: Basic Metabolic Panel: Recent Labs  Lab 11/14/20 0133 11/14/20 0603 11/15/20 0111 11/16/20 0111 11/17/20 0052 11/18/20 0044  NA 142  --  141 138  --  140  K 3.8  --  4.3 3.7  --  3.5  CL 104  --  111 106  --  110  CO2 25  --  21* 22  --  22  GLUCOSE 172*  --  134* 159*  --  138*  BUN 39*  --  23* 13  --  18  CREATININE 2.49*  --  1.40* 1.19  --  1.34*  CALCIUM 9.8  --  8.4* 8.6*  --  8.3*  MG  --  2.4  --  1.7 2.4  --     GFR Estimated Creatinine Clearance: 62 mL/min (A) (by C-G formula based on SCr of 1.34 mg/dL (H)). Liver Function Tests: Recent Labs  Lab  11/14/20 0133  AST 19  ALT 16  ALKPHOS 65  BILITOT 0.9  PROT 8.4*  ALBUMIN 4.0    No results for input(s): LIPASE, AMYLASE in the last 168 hours. No results for input(s): AMMONIA in the last 168 hours. Coagulation profile No results for input(s): INR, PROTIME in the last 168 hours. COVID-19 Labs  No results for input(s): DDIMER, FERRITIN, LDH, CRP in the last 72 hours.   Lab Results  Component Value Date   SARSCOV2NAA NEGATIVE 11/14/2020   SARSCOV2NAA POSITIVE (A) 06/21/2020  [place] Not Detected 12/04/2018    CBC: Recent Labs  Lab 11/14/20 0133 11/15/20 0111 11/16/20 0111 11/17/20 0052 11/18/20 0044  WBC 8.9 6.5 9.2 10.2 7.5  NEUTROABS 6.8  --   --   --   --   HGB 11.3* 9.5* 10.8* 10.1* 10.3*  HCT 34.9* 29.0* 32.2* 30.1* 30.9*  MCV 79.7* 79.5* 77.8* 76.6* 78.0*  PLT 274 227 256 261 237    Cardiac Enzymes: Recent Labs  Lab 11/14/20 0603 11/15/20 0111  CKTOTAL 707* 348    BNP (last 3 results) No results for input(s): PROBNP in the last 8760 hours. CBG: Recent Labs  Lab 11/17/20 1134 11/17/20 1534 11/17/20 2051 11/17/20 2323 11/18/20 0355  GLUCAP 136* 147* 136* 131* 123*    D-Dimer: No results for input(s): DDIMER in the last 72 hours. Hgb A1c: No results for input(s): HGBA1C in the last 72 hours.  Lipid Profile: No results for input(s): CHOL, HDL, LDLCALC, TRIG, CHOLHDL, LDLDIRECT in the last 72 hours. Thyroid function studies: No results for input(s): TSH, T4TOTAL, T3FREE, THYROIDAB in the last 72 hours.  Invalid input(s): FREET3  Anemia work up: No results for input(s): VITAMINB12, FOLATE, FERRITIN, TIBC, IRON, RETICCTPCT in the last 72 hours.  Sepsis Labs: Recent Labs  Lab 11/15/20 0111 11/16/20 0111 11/17/20 0052 11/18/20 0044  WBC 6.5 9.2 10.2 7.5    Microbiology Recent Results (from the past 240 hour(s))  Resp Panel by RT-PCR (Flu A&B, Covid) Nasopharyngeal Swab     Status: None   Collection Time: 11/14/20  4:02 AM    Specimen: Nasopharyngeal Swab; Nasopharyngeal(NP) swabs in vial transport medium  Result Value Ref Range Status   SARS Coronavirus 2 by RT PCR NEGATIVE NEGATIVE Final    Comment: (NOTE) SARS-CoV-2 target nucleic acids are NOT DETECTED.  The SARS-CoV-2 RNA is generally detectable in upper respiratory specimens during the acute phase of infection. The lowest concentration of SARS-CoV-2 viral copies this assay can detect is 138 copies/mL. A negative result does not preclude SARS-Cov-2 infection and should not be used as the sole basis for treatment or other patient management decisions. A negative result may occur with  improper specimen collection/handling, submission of specimen other than nasopharyngeal swab, presence of viral mutation(s) within the areas targeted by this assay, and inadequate number of viral copies(<138 copies/mL). A negative result must be combined with clinical observations, patient history, and epidemiological information. The expected result is Negative.  Fact Sheet for Patients:  EntrepreneurPulse.com.au  Fact Sheet for Healthcare Providers:  IncredibleEmployment.be  This test is no t yet approved or cleared by the [place] FDA and  has been authorized for detection and/or diagnosis of SARS-CoV-2 by FDA under an Emergency Use Authorization (EUA). This EUA will remain  in effect (meaning this test can be used) for the duration of the COVID-19 declaration under Section 564(b)(1) of the Act, 21 U.S.C.section 360bbb-3(b)(1), unless the authorization is terminated  or revoked sooner.       Influenza A by PCR NEGATIVE NEGATIVE Final   Influenza B by PCR NEGATIVE NEGATIVE Final    Comment: (NOTE) The Xpert Xpress SARS-CoV-2/FLU/RSV plus assay is intended as an aid in the diagnosis of influenza from Nasopharyngeal swab specimens and should not be used as a sole basis for treatment. Nasal washings and aspirates are  unacceptable for Xpert Xpress SARS-CoV-2/FLU/RSV testing.  Fact Sheet for Patients: EntrepreneurPulse.com.au  Fact Sheet for Healthcare Providers: IncredibleEmployment.be  This test is not yet approved or cleared by the [place]  FDA and has been authorized for detection and/or diagnosis of SARS-CoV-2 by FDA under an Emergency Use Authorization (EUA). This EUA will remain in effect (meaning this test can be used) for the duration of the COVID-19 declaration under Section 564(b)(1) of the Act, 21 U.S.C. section 360bbb-3(b)(1), unless the authorization is terminated or revoked.  Performed at [facility], [address], [place], [place] [address]   MRSA Next Gen by PCR, Nasal     Status: None   Collection Time: 11/14/20  5:38 AM   Specimen: Nasal Mucosa; Nasal Swab  Result Value Ref Range Status   MRSA by PCR Next Gen NOT DETECTED NOT DETECTED Final    Comment: (NOTE) The GeneXpert MRSA Assay (FDA approved for NASAL specimens only), is one component of a comprehensive MRSA colonization surveillance program. It is not intended to diagnose MRSA infection nor to guide or monitor treatment for MRSA infections. Test performance is not FDA approved in patients less than 64 years old. Performed at [facility], [place] [address] [place], [place] [address]      Medications:    amiodarone  200 mg Oral BID   atorvastatin  80 mg Oral Daily   bisacodyl  20 mg Oral Once   carvedilol  25 mg Oral BID WC   colchicine  0.6 mg Oral Daily   diltiazem  30 mg Oral Q6H   insulin aspart  0-9 Units Subcutaneous Q4H   oxybutynin  5 mg Oral BID   pantoprazole  40 mg Oral BID   sodium chloride flush  3 mL Intravenous Q12H   Continuous Infusions:      LOS: 4 days   Charlynne Cousins  [facility]  11/18/2020, 8:43 AM

## 2020-11-18 NOTE — Op Note (Signed)
[facility] Patient Name: [name] Procedure Date : 11/18/2020 MRN: [id number] Attending MD: Georgian Co ,  Date of Birth: Mar 09, 1965 CSN: [id number] Age: 55 Admit Type: Inpatient Procedure:                Colonoscopy Indications:              Iron deficiency anemia Providers:                Adline Mango" Carmin Richmond, RN,                            Benetta Spar, Technician, Lodema Hong                            Technician, Technician Referring MD:             Hospitalist team Medicines:                Monitored Anesthesia Care Complications:            No immediate complications. Estimated Blood Loss:     Estimated blood loss was minimal. Procedure:                Pre-Anesthesia Assessment:                           - Prior to the procedure, a History and Physical                            was performed, and patient medications and                            allergies were reviewed. The patient's tolerance of                            previous anesthesia was also reviewed. The risks                            and benefits of the procedure and the sedation                            options and risks were discussed with the patient.                            All questions were answered, and informed consent                            was obtained. Prior Anticoagulants: The patient has                            taken no previous anticoagulant or antiplatelet                            agents. ASA Grade Assessment: III - A patient with                            severe  systemic disease. After reviewing the risks                            and benefits, the patient was deemed in                            satisfactory condition to undergo the procedure.                           After obtaining informed consent, the colonoscope                            was passed under direct vision. Throughout the                             procedure, the patient's blood pressure, pulse, and                            oxygen saturations were monitored continuously. The                            [id number]-HQ190L ([id number]) Olympus colonoscope was                            introduced through the anus and advanced to the the                            cecum, identified by appendiceal orifice and                            ileocecal valve. Retroflexion was not performed due                            to tight rectum. Scope In: 11:30:00 AM Scope Out: 11:53:33 AM Scope Withdrawal Time: 0 hours 18 minutes 20 seconds  Total Procedure Duration: 0 hours 23 minutes 33 seconds  Findings:      Multiple small and large-mouthed diverticula were found in the entire       colon.      Multiple ulcers were found at the hepatic flexure, in the ascending       colon and in the cecum. No bleeding was present. No stigmata of recent       bleeding were seen. Biopsies were taken with a cold forceps for       histology.      A 4 mm polyp was found in the rectum. The polyp was sessile. The polyp       was removed with a cold snare. Resection and retrieval were complete.      Non-bleeding internal hemorrhoids were found. Retroflexion was not able       to be performed due to lack of adequate space. Impression:               - Diverticulosis in the entire examined colon.                           - Multiple ulcers at  the hepatic flexure, in the                            ascending colon and in the cecum. Biopsied.                           - One 4 mm polyp in the rectum, removed with a cold                            snare. Resected and retrieved.                           - Non-bleeding internal hemorrhoids. Recommendation:           - Return patient to hospital ward for ongoing care.                           - It is likely that the patient's anemia is either                            due to esophageal ulcers or colonic ulcers. Suspect                             that the colonic ulcers are due to ischemia, which                            should improve with time. Esophageal ulcers will                            likely heal with PPI therapy. For additional                            recommendations, please see EGD report.                           - Await pathology results.                           - The findings and recommendations were discussed                            with the patient. Procedure Code(s):        --- Professional ---                           [phone], Colonoscopy, flexible; with removal of                            tumor(s), polyp(s), or other lesion(s) by snare                            technique                           86761, 71, Colonoscopy, flexible; with biopsy,  single or multiple Diagnosis Code(s):        --- Professional ---                           K64.8, Other hemorrhoids                           K63.3, Ulcer of intestine                           K62.1, Rectal polyp                           D50.9, Iron deficiency anemia, unspecified                           K57.30, Diverticulosis of large intestine without                            perforation or abscess without bleeding CPT copyright 2019 American Medical Association. All rights reserved. The codes documented in this report are preliminary and upon coder review may  be revised to meet current compliance requirements. Sonny Masters "Christia Reading,  11/18/2020 12:39:19 PM Number of Addenda: 0

## 2020-11-18 NOTE — Interval H&P Note (Signed)
History and Physical Interval Note:  11/18/2020 11:02 AM  [name]  has presented today for surgery, with the diagnosis of Progressive anemia with iron deficiency.  2009 transanal excision cancerous rectal polyp and no subsequent colonoscopy.  Dark stools a week ago, resolved..  The various methods of treatment have been discussed with the patient and family. After consideration of risks, benefits and other options for treatment, the patient has consented to  Procedure(s): ESOPHAGOGASTRODUODENOSCOPY (EGD) WITH PROPOFOL (N/A) COLONOSCOPY WITH PROPOFOL (N/A) as a surgical intervention.  The patient's history has been reviewed, patient examined, no change in status, stable for surgery.  I have reviewed the patient's chart and labs.  Questions were answered to the patient's satisfaction.     Sharyn Creamer

## 2020-11-18 NOTE — Anesthesia Postprocedure Evaluation (Signed)
Anesthesia Post Note  Patient: [name]  Procedure(s) Performed: ESOPHAGOGASTRODUODENOSCOPY (EGD) WITH PROPOFOL BIOPSY POLYPECTOMY COLONOSCOPY WITH PROPOFOL     Patient location during evaluation: PACU Anesthesia Type: MAC Level of consciousness: awake and alert Pain management: pain level controlled Vital Signs Assessment: post-procedure vital signs reviewed and stable Respiratory status: spontaneous breathing Cardiovascular status: stable Anesthetic complications: no   No notable events documented.  Last Vitals:  Vitals:   11/18/20 1241 11/18/20 1546  BP: 119/75 140/86  Pulse: 82 94  Resp:  (!) 28  Temp:  37.7 C  SpO2: 99% 92%    Last Pain:  Vitals:   11/18/20 1546  TempSrc: Oral  PainSc:                  Nolon Nations

## 2020-11-18 NOTE — Progress Notes (Signed)
Pacu RN Report to floor given  Gave report to Production assistant, radio. [id number]. Discussed surgery, meds given in OR and [facility], VS, IV fluids given, EBL, urine output, pain and other pertinent information. Also discussed if pt had any family or friends here or belongings with them. No bleeding noted from rectum. VSS. No pain.   Pt has his glasses with him and his Tele box/wires will go to his room with him from [facility].   Pt exits my care.

## 2020-11-18 NOTE — Op Note (Signed)
[facility] Patient Name: [name] Procedure Date : 11/18/2020 MRN: [id number] Attending MD: Georgian Co ,  Date of Birth: [birth year] CSN: [id number] Age: 55 Admit Type: Inpatient Procedure:                Upper GI endoscopy Indications:              Iron deficiency anemia Providers:                Adline Mango" Nell Range, Technician,                            Lodema Hong Technician, Technician, Jeanella Cara, RN Referring MD:             Hospitalist team Medicines:                Monitored Anesthesia Care Complications:            No immediate complications. Estimated Blood Loss:     Estimated blood loss was minimal. Procedure:                Pre-Anesthesia Assessment:                           - Prior to the procedure, a History and Physical                            was performed, and patient medications and                            allergies were reviewed. The patient's tolerance of                            previous anesthesia was also reviewed. The risks                            and benefits of the procedure and the sedation                            options and risks were discussed with the patient.                            All questions were answered, and informed consent                            was obtained. Prior Anticoagulants: The patient has                            taken no previous anticoagulant or antiplatelet                            agents. ASA Grade Assessment: III - A patient with  severe systemic disease. After reviewing the risks                            and benefits, the patient was deemed in                            satisfactory condition to undergo the procedure.                           After obtaining informed consent, the endoscope was                            passed under direct vision. Throughout the                             procedure, the patient's blood pressure, pulse, and                            oxygen saturations were monitored continuously. The                            [id number]-[id number] ([id number]) Olympus endoscope was introduced                            through the mouth, and advanced to the second part                            of duodenum. The upper GI endoscopy was                            accomplished without difficulty. The patient                            tolerated the procedure well. Scope In: Scope Out: Findings:      Many linear esophageal ulcers with no bleeding and no stigmata of recent       bleeding were found in the mid esophagus. Biopsies were taken with a       cold forceps for histology.      A single submucosal papule (nodule) with no bleeding and no stigmata of       recent bleeding was found in the gastric body.      Localized erythematous mucosa was found in the gastric antrum. Biopsies       were taken with a cold forceps for Helicobacter pylori testing.      The examined duodenum was normal. Impression:               - Esophageal ulcers with no bleeding and no                            stigmata of recent bleeding. Biopsied.                           - A single submucosal papule (nodule) found in the  stomach.                           - Erythematous mucosa in the antrum. Biopsied.                           - Normal examined duodenum. Recommendation:           - Await pathology results.                           - Use a proton pump inhibitor PO BID for 8 weeks.                           - Repeat upper endoscopy in 8 weeks to check                            healing.                           - Will arrange for follow up with Dr. Ardis Hughs to                            discuss whether further evaluation of the                            submucosal nodule is needed                           - Perform a colonoscopy today. Procedure Code(s):        ---  Professional ---                           [id number], Esophagogastroduodenoscopy, flexible,                            transoral; diagnostic, including collection of                            specimen(s) by brushing or washing, when performed                            (separate procedure) Diagnosis Code(s):        --- Professional ---                           K22.10, Ulcer of esophagus without bleeding                           K31.89, Other diseases of stomach and duodenum                           D50.9, Iron deficiency anemia, unspecified CPT copyright 2019 American Medical Association. All rights reserved. The codes documented in this report are preliminary and upon coder review may  be revised to meet current compliance requirements. Sonny Masters "Christia Reading,  11/18/2020 12:18:41 PM Number of Addenda: 0

## 2020-11-18 NOTE — Progress Notes (Signed)
Subjective:  Denies any chest pain or shortness of breath seen in endoscopy department scheduled for EGD today denies any abdominal pain or bright red blood per rectum or dark stools.  Off IV amiodarone on p.o. now remains in sinus rhythm  Objective:  Vital Signs in the last 24 hours: Temp:  [98.1 F (36.7 C)-99.1 F (37.3 C)] 98.8 F (37.1 C) (11/06 1036) Pulse Rate:  [79-90] 85 (11/06 1036) Resp:  [19-33] 28 (11/06 1036) BP: (108-138)/(61-88) 138/74 (11/06 1036) SpO2:  [94 %-98 %] 98 % (11/06 1036) Weight:  [85 kg] 85 kg (11/06 1036)  Intake/Output from previous day: 11/05 0701 - 11/06 0700 In: 306.2 [I.V.:306.2] Out: 1200 [Urine:1200] Intake/Output from this shift: No intake/output data recorded.  Physical Exam: Exam unchanged  Lab Results: Recent Labs    11/17/20 0052 11/18/20 0044  WBC 10.2 7.5  HGB 10.1* 10.3*  PLT 261 237   Recent Labs    11/16/20 0111 11/18/20 0044  NA 138 140  K 3.7 3.5  CL 106 110  CO2 22 22  GLUCOSE 159* 138*  BUN 13 18  CREATININE 1.19 1.34*   No results for input(s): TROPONINI in the last 72 hours.  Invalid input(s): CK, MB Hepatic Function Panel No results for input(s): PROT, ALBUMIN, AST, ALT, ALKPHOS, BILITOT, BILIDIR, IBILI in the last 72 hours. No results for input(s): CHOL in the last 72 hours. No results for input(s): PROTIME in the last 72 hours.  Imaging: DG Abd Portable 1V  Result Date: 11/16/2020 CLINICAL DATA:  Nausea and vomiting with abdominal distension. EXAM: PORTABLE ABDOMEN - 1 VIEW COMPARISON:  CT chest abdomen and pelvis 11/14/2020. FINDINGS: The stomach is markedly distended and air-filled, a new finding. Air is seen within nondilated large and small bowel to the level of the sigmoid. There is a phlebolith left in the left hemipelvis. Degenerative changes affect the right hip. Left hip arthroplasty is present. IMPRESSION: 1. New marked gaseous distention of the stomach. Electronically Signed   By: Ronney Asters M.D.   On: 11/16/2020 15:54    Cardiac Studies:  Assessment/Plan:  Status post atrial fibrillation with RVR CHA2DS2-VASc score of 4 S/P Right MCA stroke with left paresis in the past Acute GI bleed HTN Type 2 DM Obesity CKD, II Hypokalemia Plan Replace K keep K around 4 Start DOAC if okay with GI  LOS: 4 days    Charolette Forward 11/18/2020, 11:11 AM

## 2020-11-19 LAB — CBC
HCT: 31.8 % — ABNORMAL LOW (ref 39.0–52.0)
Hemoglobin: 10.6 g/dL — ABNORMAL LOW (ref 13.0–17.0)
MCH: 25.9 pg — ABNORMAL LOW (ref 26.0–34.0)
MCHC: 33.3 g/dL (ref 30.0–36.0)
MCV: 77.6 fL — ABNORMAL LOW (ref 80.0–100.0)
Platelets: 304 10*3/uL (ref 150–400)
RBC: 4.1 MIL/uL — ABNORMAL LOW (ref 4.22–5.81)
RDW: 15.3 % (ref 11.5–15.5)
WBC: 7 10*3/uL (ref 4.0–10.5)
nRBC: 0 % (ref 0.0–0.2)

## 2020-11-19 LAB — BASIC METABOLIC PANEL
Anion gap: 10 (ref 5–15)
BUN: 17 mg/dL (ref 6–20)
CO2: 23 mmol/L (ref 22–32)
Calcium: 8.9 mg/dL (ref 8.9–10.3)
Chloride: 109 mmol/L (ref 98–111)
Creatinine, Ser: 1.37 mg/dL — ABNORMAL HIGH (ref 0.61–1.24)
GFR, Estimated: >60 mL/min (ref >60)
Glucose, Bld: 132 mg/dL — ABNORMAL HIGH (ref 70–99)
Potassium: 3.8 mmol/L (ref 3.5–5.1)
Sodium: 142 mmol/L (ref 135–145)

## 2020-11-19 LAB — GLUCOSE, CAPILLARY
Glucose-Capillary: 110 mg/dL — ABNORMAL HIGH (ref 70–99)
Glucose-Capillary: 131 mg/dL — ABNORMAL HIGH (ref 70–99)
Glucose-Capillary: 139 mg/dL — ABNORMAL HIGH (ref 70–99)
Glucose-Capillary: 140 mg/dL — ABNORMAL HIGH (ref 70–99)
Glucose-Capillary: 141 mg/dL — ABNORMAL HIGH (ref 70–99)
Glucose-Capillary: 163 mg/dL — ABNORMAL HIGH (ref 70–99)

## 2020-11-19 MED ORDER — DILTIAZEM HCL 60 MG PO TABS
60.0000 mg | ORAL_TABLET | Freq: Four times a day (QID) | ORAL | Status: DC
  Administered 2020-11-19 – 2020-11-23 (×16): 60 mg via ORAL
  Filled 2020-11-19 (×16): qty 1

## 2020-11-19 NOTE — Consult Note (Signed)
Ref: Ladell Pier, MD   Subjective:  Awake. VS stable. Negative GI work up so far. He remains in sinus rhythm. He is on amiodarone, diltiazem and carvedilol.  Objective:  Vital Signs in the last 24 hours: Temp:  [98.4 F (36.9 C)-99.4 F (37.4 C)] 99.4 F (37.4 C) (11/07 1607) Pulse Rate:  [94-100] 94 (11/07 1754) Cardiac Rhythm: Sinus tachycardia (11/07 0700) Resp:  [18-40] 30 (11/07 1607) BP: (123-166)/(67-99) 143/90 (11/07 1754) SpO2:  [91 %-100 %] 93 % (11/07 1607) Weight:  [84.4 kg] 84.4 kg (11/07 0358)  Physical Exam: BP Readings from Last 1 Encounters:  11/19/20 (!) 143/90     Wt Readings from Last 1 Encounters:  11/19/20 84.4 kg    Weight change: 0 kg Body mass index is 31.94 kg/m. HEENT: [place]/AT, Eyes-Brown, Conjunctiva-Pink, Sclera-Non-icteric Neck: No JVD, No bruit, Trachea midline. Lungs:  Clear, Bilateral. Cardiac:  Regular rhythm, normal S1 and S2, no S3. II/VI systolic murmur. Abdomen:  Soft, non-tender. BS present. Extremities:  No edema present. No cyanosis. No clubbing. CNS: AxOx3, Cranial nerves grossly intact, moves all 4 extremities.  Skin: Warm and dry.   Intake/Output from previous day: 11/06 0701 - 11/07 0700 In: 780 [P.O.:480; I.V.:300] Out: 1400 [Urine:1400]    Lab Results: BMET    Component Value Date/Time   NA 142 11/19/2020 0225   NA 140 11/18/2020 0044   NA 138 11/16/2020 0111   NA 145 (H) 06/07/2020 1019   NA 142 11/11/2018 1400   NA 144 11/23/2017 1706   K 3.8 11/19/2020 0225   K 3.5 11/18/2020 0044   K 3.7 11/16/2020 0111   CL 109 11/19/2020 0225   CL 110 11/18/2020 0044   CL 106 11/16/2020 0111   CO2 23 11/19/2020 0225   CO2 22 11/18/2020 0044   CO2 22 11/16/2020 0111   GLUCOSE 132 (H) 11/19/2020 0225   GLUCOSE 138 (H) 11/18/2020 0044   GLUCOSE 159 (H) 11/16/2020 0111   BUN 17 11/19/2020 0225   BUN 18 11/18/2020 0044   BUN 13 11/16/2020 0111   BUN 17 06/07/2020 1019   BUN 11 11/11/2018 1400   BUN 13  01/26/2018 1026   CREATININE 1.37 (H) 11/19/2020 0225   CREATININE 1.34 (H) 11/18/2020 0044   CREATININE 1.19 11/16/2020 0111   CALCIUM 8.9 11/19/2020 0225   CALCIUM 8.3 (L) 11/18/2020 0044   CALCIUM 8.6 (L) 11/16/2020 0111   GFRNONAA >60 11/19/2020 0225   GFRNONAA >60 11/18/2020 0044   GFRNONAA >60 11/16/2020 0111   GFRAA >60 04/21/2019 1049   GFRAA 87 11/11/2018 1400   GFRAA 85 01/26/2018 1026   CBC    Component Value Date/Time   WBC 7.0 11/19/2020 0225   RBC 4.10 (L) 11/19/2020 0225   HGB 10.6 (L) 11/19/2020 0225   HGB 10.6 (L) 10/15/2020 1025   HGB 14.2 08/02/2008 1026   HCT 31.8 (L) 11/19/2020 0225   HCT 31.8 (L) 10/15/2020 1025   HCT 41.6 08/02/2008 1026   PLT 304 11/19/2020 0225   PLT 211 10/15/2020 1025   MCV 77.6 (L) 11/19/2020 0225   MCV 78 (L) 10/15/2020 1025   MCV 78.4 (L) 08/02/2008 1026   MCH 25.9 (L) 11/19/2020 0225   MCHC 33.3 11/19/2020 0225   RDW 15.3 11/19/2020 0225   RDW 13.6 10/15/2020 1025   RDW 16.3 (H) 08/02/2008 1026   LYMPHSABS 1.3 11/14/2020 0133   LYMPHSABS 1.3 11/11/2018 1400   LYMPHSABS 0.6 (L) 08/02/2008 1026   MONOABS 0.8  11/14/2020 0133   MONOABS 0.2 08/02/2008 1026   EOSABS 0.0 11/14/2020 0133   EOSABS 0.1 11/11/2018 1400   BASOSABS 0.0 11/14/2020 0133   BASOSABS 0.0 11/11/2018 1400   BASOSABS 0.0 08/02/2008 1026   HEPATIC Function Panel Recent Labs    06/07/20 1019 11/14/20 0133  PROT 7.4 8.4*   HEMOGLOBIN A1C No components found for: HGA1C,  MPG CARDIAC ENZYMES Lab Results  Component Value Date   CKTOTAL 348 11/15/2020   BNP No results for input(s): PROBNP in the last 8760 hours. TSH Recent Labs    11/14/20 1246  TSH 1.076   CHOLESTEROL Recent Labs    06/07/20 1019  CHOL 141    Scheduled Meds:  amiodarone  200 mg Oral BID   atorvastatin  80 mg Oral Daily   bisacodyl  20 mg Oral Once   carvedilol  25 mg Oral BID WC   colchicine  0.6 mg Oral Daily   diltiazem  60 mg Oral Q6H   insulin aspart  0-9 Units  Subcutaneous Q4H   oxybutynin  5 mg Oral BID   pantoprazole  40 mg Oral BID   sodium chloride flush  3 mL Intravenous Q12H   Continuous Infusions:  PRN Meds:.acetaminophen **OR** acetaminophen, acetaminophen-codeine, albuterol, Gerhardt's butt cream, LORazepam  Assessment/Plan:  Paroxysmal atrial fibrillation S/P right MCA stroke with left sided weakness Esophageal ulcer HTN Type 2 DM Obesity CKD, II  Plan: Continue medical treatment. Postpone DOAC for 8 weeks as repeat EGD is planned in 8 weeks.   LOS: 5 days   Time spent including chart review, lab review, examination, discussion with patient/Nurse : 30 min   Dixie Dials  MD  11/19/2020, 6:16 PM

## 2020-11-19 NOTE — Progress Notes (Signed)
[facility] PROGRESS NOTE    Progress Note  [name]  GYI:[id number] DOB: 02/10/1965 DOA: 11/14/2020 PCP: Ladell Pier, MD     Brief Narrative:   [name] is an 55 y.o. male past medical history significant for essential hypertension, chronic diastolic heart failure CVA with left residual weakness diabetes mellitus type 2 seizure disorder history of rectal cancer status post resection with radiation and chemotherapy comes in complaining of left-sided pain.  At baseline gets around with a wheelchair last night when he was trying to transition from wheelchair to bed he missed and fell he did not lose consciousness during the ED he was found on A. fib with RVR in 140s blood pressure 86/63-127/111 influenza and COVID were negative CT scan of the chest abdomen and pelvis was negative for any acute abnormalities he was given a liter normal saline and started on Cardizem drip.    Assessment/Plan:   New onset Atrial fibrillation with RVR (HCC) CHADS VASC score at least 4. Now on oral Coreg, diltiazem and amiodarone. Now back in sinus rhythm. Cardiology has been consulted 2D echo showed an EF of 35% grade 1 diastolic heart failure. Question the trigger of his A. fib was probably due to acute gout flare, he was in SIRS. GI to dictate when to start [place].    Microcytic anemia: FOBT negative, GI has been consulted for endoscopic procedure. Had to be delayed as the patient went into A. fib with RVR. Endoscopically showed esophageal ulcer, GI recommended to continue PPI and repeat endoscopy in 8 weeks biopsies were taken. Colonoscopy showed a cecal polyp.  Acute kidney injury: With a baseline creatinine around 1.2 on admission 2.9. In the setting of NSAID and ACE inhibitor. Resolved with fluid resuscitation and holding nephrotoxic medication.  Diabetes mellitus type 2: Last A1c 6.7 Continue sliding scale required minimal insulin.  Left flank pain: Secondary to  fall CT of chest abdomen pelvis showed no acute findings.  History of gout flare: Still with pain in multiple joints we will start him on colchicine.  Currently on Coreg and diltiazem as blood pressure is holding steady.  Essential hypertension: Blood pressure low on admission antihypertensive medications were held except for his Coreg he was fluid resuscitated. His blood pressure is now improved.  History of, chronic diastolic dysfunction: Appears euvolemic on physical exam continue Coreg.  Continue to hold ACE inhibitor in the setting of acute kidney injury.  History of CVA: Continue Lipitor. Will need to remind him that he needs to take it as an outpatient.  History of meningioma: Stable on CT.  Dyslipidemia:  Excellent continue statins.  Obesity: Counseling.   DVT prophylaxis: scd Family Communication:wife Status is: Inpatient  Remains inpatient appropriate because: Severity of illness A. fib with RVR with microcytic anemia    Code Status:     Code Status Orders  (From admission, onward)           Start     Ordered   11/14/20 0738  Full code  Continuous        11/14/20 0744           Code Status History     Date Active Date Inactive Code Status Order ID Comments User Context   12/18/2016 1646 01/08/2017 1441 Full Code [id number]  [name] Inpatient   12/18/2016 1645 12/18/2016 1646 Full Code [id number]  Mark Parsons, PA-C Inpatient   12/12/2016 2347 12/18/2016 1637 Full Code [id number]  Mark Quill,  DO ED   07/22/2014 0303 07/27/2014 1757 Full Code [id number]  Mark Millard, MD ED         IV Access:   Peripheral IV   Procedures and diagnostic studies:   No results found.   Medical Consultants:   None.   Subjective:    [name] tolerating his diet no complaints.  Objective:    Vitals:   11/19/20 0042 11/19/20 0358 11/19/20 0600 11/19/20 0725  BP: (!) 150/91 (!) 142/95 (!) 153/86 (!) 166/99   Pulse:  97 97 100  Resp:  20 18 (!) 40  Temp:  98.6 F (37 C)  98.9 F (37.2 C)  TempSrc:  Oral  Oral  SpO2:  95% 91% 96%  Weight:  84.4 kg    Height:       SpO2: 96 % O2 Flow Rate (L/min): 0 L/min FiO2 (%): 21 %   Intake/Output Summary (Last 24 hours) at 11/19/2020 0752 Last data filed at 11/19/2020 0600 Gross per 24 hour  Intake 780 ml  Output 1400 ml  Net -620 ml    Filed Weights   11/18/20 0547 11/18/20 1036 11/19/20 0358  Weight: 85 kg 85 kg 84.4 kg    Exam: General exam: In no acute distress. Respiratory system: Good air movement and clear to auscultation. Cardiovascular system: S1 & S2 heard, RRR. No JVD. Gastrointestinal system: Abdomen is nondistended, soft and nontender.  Extremities: No pedal edema. Skin: No rashes, lesions or ulcers  Data Reviewed:    Labs: Basic Metabolic Panel: Recent Labs  Lab 11/14/20 0133 11/14/20 0603 11/15/20 0111 11/16/20 0111 11/17/20 0052 11/18/20 0044 11/19/20 0225  NA 142  --  141 138  --  140 142  K 3.8  --  4.3 3.7  --  3.5 3.8  CL 104  --  111 106  --  110 109  CO2 25  --  21* 22  --  22 23  GLUCOSE 172*  --  134* 159*  --  138* 132*  BUN 39*  --  23* 13  --  18 17  CREATININE 2.49*  --  1.40* 1.19  --  1.34* 1.37*  CALCIUM 9.8  --  8.4* 8.6*  --  8.3* 8.9  MG  --  2.4  --  1.7 2.4  --   --     GFR Estimated Creatinine Clearance: 60.4 mL/min (A) (by C-G formula based on SCr of 1.37 mg/dL (H)). Liver Function Tests: Recent Labs  Lab 11/14/20 0133  AST 19  ALT 16  ALKPHOS 65  BILITOT 0.9  PROT 8.4*  ALBUMIN 4.0    No results for input(s): LIPASE, AMYLASE in the last 168 hours. No results for input(s): AMMONIA in the last 168 hours. Coagulation profile No results for input(s): INR, PROTIME in the last 168 hours. COVID-19 Labs  No results for input(s): DDIMER, FERRITIN, LDH, CRP in the last 72 hours.   Lab Results  Component Value Date   SARSCOV2NAA NEGATIVE 11/14/2020   SARSCOV2NAA POSITIVE  (A) 06/21/2020   [place] Not Detected 12/04/2018    CBC: Recent Labs  Lab 11/14/20 0133 11/15/20 0111 11/16/20 0111 11/17/20 0052 11/18/20 0044 11/19/20 0225  WBC 8.9 6.5 9.2 10.2 7.5 7.0  NEUTROABS 6.8  --   --   --   --   --   HGB 11.3* 9.5* 10.8* 10.1* 10.3* 10.6*  HCT 34.9* 29.0* 32.2* 30.1* 30.9* 31.8*  MCV 79.7* 79.5* 77.8* 76.6* 78.0* 77.6*  PLT 274 227 256 261 237 304    Cardiac Enzymes: Recent Labs  Lab 11/14/20 0603 11/15/20 0111  CKTOTAL 707* 348    BNP (last 3 results) No results for input(s): PROBNP in the last 8760 hours. CBG: Recent Labs  Lab 11/18/20 1544 11/18/20 2016 11/19/20 0036 11/19/20 0357 11/19/20 0728  GLUCAP 134* 124* 110* 140* 131*    D-Dimer: No results for input(s): DDIMER in the last 72 hours. Hgb A1c: No results for input(s): HGBA1C in the last 72 hours.  Lipid Profile: No results for input(s): CHOL, HDL, LDLCALC, TRIG, CHOLHDL, LDLDIRECT in the last 72 hours. Thyroid function studies: No results for input(s): TSH, T4TOTAL, T3FREE, THYROIDAB in the last 72 hours.  Invalid input(s): FREET3  Anemia work up: No results for input(s): VITAMINB12, FOLATE, FERRITIN, TIBC, IRON, RETICCTPCT in the last 72 hours.  Sepsis Labs: Recent Labs  Lab 11/16/20 0111 11/17/20 0052 11/18/20 0044 11/19/20 0225  WBC 9.2 10.2 7.5 7.0    Microbiology Recent Results (from the past 240 hour(s))  Resp Panel by RT-PCR (Flu A&B, Covid) Nasopharyngeal Swab     Status: None   Collection Time: 11/14/20  4:02 AM   Specimen: Nasopharyngeal Swab; Nasopharyngeal(NP) swabs in vial transport medium  Result Value Ref Range Status   SARS Coronavirus 2 by RT PCR NEGATIVE NEGATIVE Final    Comment: (NOTE) SARS-CoV-2 target nucleic acids are NOT DETECTED.  The SARS-CoV-2 RNA is generally detectable in upper respiratory specimens during the acute phase of infection. The lowest concentration of SARS-CoV-2 viral copies this assay can detect is 138  copies/mL. A negative result does not preclude SARS-Cov-2 infection and should not be used as the sole basis for treatment or other patient management decisions. A negative result may occur with  improper specimen collection/handling, submission of specimen other than nasopharyngeal swab, presence of viral mutation(s) within the areas targeted by this assay, and inadequate number of viral copies(<138 copies/mL). A negative result must be combined with clinical observations, patient history, and epidemiological information. The expected result is Negative.  Fact Sheet for Patients:  EntrepreneurPulse.com.au  Fact Sheet for Healthcare Providers:  IncredibleEmployment.be  This test is no t yet approved or cleared by the [place] FDA and  has been authorized for detection and/or diagnosis of SARS-CoV-2 by FDA under an Emergency Use Authorization (EUA). This EUA will remain  in effect (meaning this test can be used) for the duration of the COVID-19 declaration under Section 564(b)(1) of the Act, 21 U.S.C.section 360bbb-3(b)(1), unless the authorization is terminated  or revoked sooner.       Influenza A by PCR NEGATIVE NEGATIVE Final   Influenza B by PCR NEGATIVE NEGATIVE Final    Comment: (NOTE) The Xpert Xpress SARS-CoV-2/FLU/RSV plus assay is intended as an aid in the diagnosis of influenza from Nasopharyngeal swab specimens and should not be used as a sole basis for treatment. Nasal washings and aspirates are unacceptable for Xpert Xpress SARS-CoV-2/FLU/RSV testing.  Fact Sheet for Patients: EntrepreneurPulse.com.au  Fact Sheet for Healthcare Providers: IncredibleEmployment.be  This test is not yet approved or cleared by the [place] FDA and has been authorized for detection and/or diagnosis of SARS-CoV-2 by FDA under an Emergency Use Authorization (EUA). This EUA will remain in effect (meaning  this test can be used) for the duration of the COVID-19 declaration under Section 564(b)(1) of the Act, 21 U.S.C. section 360bbb-3(b)(1), unless the authorization is terminated or revoked.  Performed at [facility], [address]  [place], [place] [address]   MRSA Next Gen by PCR, Nasal     Status: None   Collection Time: 11/14/20  5:38 AM   Specimen: Nasal Mucosa; Nasal Swab  Result Value Ref Range Status   MRSA by PCR Next Gen NOT DETECTED NOT DETECTED Final    Comment: (NOTE) The GeneXpert MRSA Assay (FDA approved for NASAL specimens only), is one component of a comprehensive MRSA colonization surveillance program. It is not intended to diagnose MRSA infection nor to guide or monitor treatment for MRSA infections. Test performance is not FDA approved in patients less than 47 years old. Performed at [facility], [place] [address] [place], [place] [address]      Medications:    amiodarone  200 mg Oral BID   atorvastatin  80 mg Oral Daily   bisacodyl  20 mg Oral Once   carvedilol  25 mg Oral BID WC   colchicine  0.6 mg Oral Daily   diltiazem  30 mg Oral Q6H   insulin aspart  0-9 Units Subcutaneous Q4H   oxybutynin  5 mg Oral BID   pantoprazole  40 mg Oral BID   sodium chloride flush  3 mL Intravenous Q12H   Continuous Infusions:      LOS: 5 days   Charlynne Cousins  [facility]  11/19/2020, 7:52 AM

## 2020-11-19 NOTE — TOC Benefit Eligibility Note (Addendum)
Transition of Care [facility]) Benefit Eligibility Note    Patient Details  Name: [name] MRN: [id number] Date of Birth: [birth year]   Medication/Dose: Eliquis 2.5 mg and 5mg . bid  Covered?: No        Spoke with Person/Company/Phone Number:: Ulis Rias W/[employer] PH# [phone]     Prior Approval: Yes [phone])          Shelda Altes Phone Number: 11/19/2020, 4:30 PM

## 2020-11-19 NOTE — Plan of Care (Signed)

## 2020-11-19 NOTE — TOC Benefit Eligibility Note (Signed)
Transition of Care [facility]) Benefit Eligibility Note    Patient Details  Name: [name] MRN: [id number] Date of Birth: September 03, 1965   Medication/Dose: Eliquis 2.5mg  and or 5mg . bid  Covered?: Yes  Tier: 3 Drug  Prescription Coverage Preferred Pharmacy: [facility] with Person/Company/Phone Number:: Colvin Caroli [employer] PH# [phone]  Co-Pay: $99.00  Prior Approval: No  Deductible:  (?)       Shelda Altes Phone Number: 11/19/2020, 4:39 PM

## 2020-11-20 ENCOUNTER — Other Ambulatory Visit (HOSPITAL_COMMUNITY): Payer: Self-pay

## 2020-11-20 LAB — BASIC METABOLIC PANEL
Anion gap: 11 (ref 5–15)
BUN: 21 mg/dL — ABNORMAL HIGH (ref 6–20)
CO2: 21 mmol/L — ABNORMAL LOW (ref 22–32)
Calcium: 8.8 mg/dL — ABNORMAL LOW (ref 8.9–10.3)
Chloride: 107 mmol/L (ref 98–111)
Creatinine, Ser: 1.27 mg/dL — ABNORMAL HIGH (ref 0.61–1.24)
GFR, Estimated: >60 mL/min (ref >60)
Glucose, Bld: 140 mg/dL — ABNORMAL HIGH (ref 70–99)
Potassium: 4.2 mmol/L (ref 3.5–5.1)
Sodium: 139 mmol/L (ref 135–145)

## 2020-11-20 LAB — SURGICAL PATHOLOGY

## 2020-11-20 LAB — GLUCOSE, CAPILLARY
Glucose-Capillary: 107 mg/dL — ABNORMAL HIGH (ref 70–99)
Glucose-Capillary: 121 mg/dL — ABNORMAL HIGH (ref 70–99)
Glucose-Capillary: 133 mg/dL — ABNORMAL HIGH (ref 70–99)
Glucose-Capillary: 142 mg/dL — ABNORMAL HIGH (ref 70–99)
Glucose-Capillary: 155 mg/dL — ABNORMAL HIGH (ref 70–99)
Glucose-Capillary: 165 mg/dL — ABNORMAL HIGH (ref 70–99)
Glucose-Capillary: 194 mg/dL — ABNORMAL HIGH (ref 70–99)

## 2020-11-20 MED ORDER — PANTOPRAZOLE SODIUM 40 MG PO TBEC
40.0000 mg | DELAYED_RELEASE_TABLET | Freq: Two times a day (BID) | ORAL | 1 refills | Status: DC
  Filled 2020-11-20: qty 60, 30d supply, fill #0

## 2020-11-20 MED ORDER — DILTIAZEM HCL ER COATED BEADS 240 MG PO CP24
240.0000 mg | ORAL_CAPSULE | Freq: Every day | ORAL | 11 refills | Status: DC
  Filled 2020-11-20: qty 30, 30d supply, fill #0

## 2020-11-20 MED ORDER — AMIODARONE HCL 200 MG PO TABS
200.0000 mg | ORAL_TABLET | Freq: Two times a day (BID) | ORAL | 0 refills | Status: DC
  Filled 2020-11-20: qty 60, 30d supply, fill #0

## 2020-11-20 NOTE — Evaluation (Addendum)
Occupational Therapy Evaluation Patient Details Name: [name] MRN: [id number] DOB: [birth year] Today's Date: 11/20/2020   History of Present Illness 55 y.o. male admitted 11/14/20 with fall from w/c with c/o L-side pain, no LOC; pt found to have afib with RVR, anemia. Imaging negative for acute injury. S/p colonoscopy and EGD 11/6 which showed polyp and esophageal ulcer. PMH includes CVA (uses w/c at baseline), HTN, DM2, diastolic dysfunction, gout, obesity.   Clinical Impression   PTA, pt was living with his wife who assisted with bathing and dressing; pt performing stand pivots to w/c with assist as needed. Pt currently requiring Mod A for UB ADLs, Max A for LB ADLs, and Mod A +2 for sit<>stand with stedy. Pt very motivated to return to PLOF and participate in therapy.  Pt presenting with decreased strength, balance, and activity tolerance compared to baseline function. Pt would benefit from further acute OT to facilitate safe dc. Recommend dc to CIR for intensive OT to optimize safety, independence with ADLs, and return to PLOF.   Of note, requested patient ask wife to bring his resting hand splint from home. Will need to follow up and implement schedule for wear.      Recommendations for follow up therapy are one component of a multi-disciplinary discharge planning process, led by the attending physician.  Recommendations may be updated based on patient status, additional functional criteria and insurance authorization.   Follow Up Recommendations  Acute inpatient rehab (3hours/day)    Assistance Recommended at Discharge Frequent or constant Supervision/Assistance  Functional Status Assessment  Patient has had a recent decline in their functional status and demonstrates the ability to make significant improvements in function in a reasonable and predictable amount of time.  Equipment Recommendations  Other (comment) (defer to next venue)    Recommendations for Other Services PT  consult;Rehab consult     Precautions / Restrictions Precautions Precautions: Fall;Other (comment) Precaution Comments: H/o CVA with residual L hemiparesis      Mobility Bed Mobility Overal bed mobility: Needs Assistance Bed Mobility: Rolling Rolling: Min assist;Mod assist;+2 for safety/equipment;+2 for physical assistance         General bed mobility comments: Min A for rolling to L; cues for pulling with RUE and pushing with RLE. Mod A for rolling R    Transfers Overall transfer level: Needs assistance Equipment used: Ambulation equipment used Transfers: Sit to/from Stand;Bed to chair/wheelchair/BSC Sit to Stand: Max assist;+2 physical assistance           General transfer comment: Use of maxi move to transfer pt to recliner. Once in recliner, pt very motivated to attempt sit<>stand. Use of stedy to practice sit<>Stand with cues for forward weight shift and standing posture. Mod A +2 for power up into standing from reclienr seat and Mod A for sit<>stand from stedy seat. Transfer via Lift Equipment: [place] Overall balance assessment: Needs assistance Sitting-balance support: No upper extremity supported;Feet supported Sitting balance-Leahy Scale: Fair       Standing balance-Leahy Scale: Poor Standing balance comment: Use of stedy and Mod A +2 for sity<>Stand                           ADL either performed or assessed with clinical judgement   ADL Overall ADL's : Needs assistance/impaired Eating/Feeding: Minimal assistance;Sitting Eating/Feeding Details (indicate cue type and reason): Min A for bilateral tasks Grooming: Minimal assistance;Sitting   Upper Body Bathing: Moderate assistance;Sitting  Lower Body Bathing: Maximal assistance;Sit to/from stand   Upper Body Dressing : Moderate assistance;Sitting   Lower Body Dressing: Maximal assistance;Sit to/from stand   Toilet Transfer: Total assistance Toilet Transfer Details (indicate  cue type and reason): Transfer to recliner with Maxi-move. Then performing sit<>stand with stedy and Mod A +2         Functional mobility during ADLs: Moderate assistance;+2 for physical assistance (sit<>stand) General ADL Comments: Pt performing transfer to reclienr wiht maxi move and then sit<>stand with stedy. Pt very motivated. demosntrating decrased balance, strength, and activity tolerance compard to baseline function     Vision Baseline Vision/History: 1 Wears glasses       Perception     Praxis      Pertinent Vitals/Pain Pain Assessment: Faces Faces Pain Scale: Hurts a little bit Pain Location: R knee Pain Descriptors / Indicators: Discomfort Pain Intervention(s): Monitored during session;Limited activity within patient's tolerance;Repositioned     Hand Dominance Right   Extremity/Trunk Assessment Upper Extremity Assessment Upper Extremity Assessment: LUE deficits/detail LUE Deficits / Details: Hemi with tendency for flexion of hand, wrist, and elbow. subluxation of one finger width; no reported pain. ABle to perform AROM to lift arm off lap and place on pillow Pt reporting he wears a resting hand splint in the mornings. Noting his digits tight in flexion. Able to PROM into extension with increased time and effort. LUE: Subluxation noted LUE Coordination: decreased fine motor;decreased gross motor   Lower Extremity Assessment Lower Extremity Assessment: Defer to PT evaluation   Cervical / Trunk Assessment Cervical / Trunk Assessment: Other exceptions Cervical / Trunk Exceptions: Tendency for L lateral lean   Communication Communication Communication: Expressive difficulties   Cognition Arousal/Alertness: Awake/alert Behavior During Therapy: WFL for tasks assessed/performed;Flat affect Overall Cognitive Status: Within Functional Limits for tasks assessed                                 General Comments: WFL for simple tasks, not formally assessed;  appears to have good insight into need for post-acute rehab before return home     General Comments  VSS on RA    Exercises     Shoulder Instructions      Home Living Family/patient expects to be discharged to:: Private residence Living Arrangements: Spouse/significant other;Children Available Help at Discharge: Family;Available 24 hours/day Type of Home: House Home Access: Stairs to enter [employer] of Steps: 1 Entrance Stairs-Rails: None Home Layout: One level     Bathroom Shower/Tub: Teacher, early years/pre: Standard     Home Equipment: Cane - quad;BSC/3in1;Wheelchair - manual   Additional Comments: Lives with wife and 90 y.o. daughter      Prior Functioning/Environment Prior Level of Function : Needs assist       Physical Assist : Mobility (physical);ADLs (physical) Mobility (physical): Bed mobility;Transfers;Stairs   Mobility Comments: Assist from wife PRN for bed mobility; typically performs stand pivots from bed<>wheelchair<>BSC with PRN assist from wife; assist to bump up 1 step into home in w/c. Wife drives. Enjoys reading and watching tv ADLs Comments: Sits EOB for bathing with assist from wife; typically able to don shoes/socks with adaptive aids; uses BSC for toileting        OT Problem List: Decreased strength;Decreased range of motion;Decreased activity tolerance;Impaired balance (sitting and/or standing);Decreased knowledge of use of DME or AE;Decreased knowledge of precautions;Impaired UE functional use      OT Treatment/Interventions:  Self-care/ADL training;Therapeutic exercise;Energy conservation;DME and/or AE instruction;Therapeutic activities;Patient/family education    OT Goals(Current goals can be found in the care plan section) Acute Rehab OT Goals Patient Stated Goal: Go to rehab and get stronger OT Goal Formulation: With patient Time For Goal Achievement: 12/04/20 Potential to Achieve Goals: Good ADL Goals Pt Will  Perform Grooming: with set-up;with supervision;with adaptive equipment;sitting Pt Will Perform Upper Body Dressing: with min guard assist;sitting Pt Will Perform Lower Body Dressing: with min assist;with caregiver independent in assisting;sit to/from stand Pt Will Transfer to Toilet: with min assist;stand pivot transfer;bedside commode Pt Will Perform Toileting - Clothing Manipulation and hygiene: with min assist;sit to/from stand;sitting/lateral leans;with caregiver independent in assisting  OT Frequency: Min 2X/week   Barriers to D/C:            Co-evaluation              AM-PAC OT "6 Clicks" Daily Activity     Outcome Measure Help from another person eating meals?: A Little Help from another person taking care of personal grooming?: A Little Help from another person toileting, which includes using toliet, bedpan, or urinal?: A Lot Help from another person bathing (including washing, rinsing, drying)?: A Lot Help from another person to put on and taking off regular upper body clothing?: A Lot Help from another person to put on and taking off regular lower body clothing?: A Lot 6 Click Score: 14   End of Session Equipment Utilized During Treatment: Gait belt Nurse Communication: Mobility status;Need for lift equipment  Activity Tolerance: Patient tolerated treatment well Patient left: in chair;with call bell/phone within reach;Other (comment) (set up to eat dinner)  OT Visit Diagnosis: Unsteadiness on feet (R26.81);Other abnormalities of gait and mobility (R26.89);Muscle weakness (generalized) (M62.81)                Time: 1640-1701 OT Time Calculation (min): 21 min Charges:  OT General Charges $OT Visit: 1 Visit OT Evaluation $OT Eval Moderate Complexity: 1 Mod  Jamaiyah Pyle MSOT, OTR/L Acute Rehab Pager: [phone] Office: Morning Sun 11/20/2020, 5:25 PM

## 2020-11-20 NOTE — Progress Notes (Signed)
Inpatient Rehab Admissions Coordinator:  ? ?Per therapy recommendations,  patient was screened for CIR candidacy by Marquavion Venhuizen, MS, CCC-SLP. At this time, Pt. Appears to be a a potential candidate for CIR. I will place   order for rehab consult per protocol for full assessment. Please contact me any with questions. ? ?Atha Muradyan, MS, CCC-SLP ?Rehab Admissions Coordinator  ?[phone] (celll) ?[phone] (office) ? ?

## 2020-11-20 NOTE — Consult Note (Signed)
Ref: Ladell Pier, MD   Subjective:  Awake. Normal sinus rhythm. He has decreased ability to ambulate.  Objective:  Vital Signs in the last 24 hours: Temp:  [97.9 F (36.6 C)-99.4 F (37.4 C)] 98.3 F (36.8 C) (11/08 1051) Pulse Rate:  [79-97] 79 (11/08 1051) Cardiac Rhythm: Normal sinus rhythm (11/08 0700) Resp:  [18-28] 23 (11/08 1051) BP: (119-157)/(67-102) 120/84 (11/08 1051) SpO2:  [93 %-98 %] 96 % (11/08 1051) Weight:  [83 kg] 83 kg (11/08 0552)  Physical Exam: BP Readings from Last 1 Encounters:  11/20/20 120/84     Wt Readings from Last 1 Encounters:  11/20/20 83 kg    Weight change: -2 kg Body mass index is 31.41 kg/m. HEENT: [place]/AT, Eyes-Brown, PERL, EOMI, Conjunctiva-Pink, Sclera-Non-icteric Neck: No JVD, No bruit, Trachea midline. Lungs:  Clear, Bilateral. Cardiac:  Regular rhythm, normal S1 and S2, no S3. II/VI systolic murmur. Abdomen:  Soft, non-tender. BS present. Extremities:  No edema present. No cyanosis. No clubbing. CNS: AxOx3, Cranial nerves grossly intact, Left sided weakness.  Skin: Warm and dry.   Intake/Output from previous day: 11/07 0701 - 11/08 0700 In: 703 [P.O.:700; I.V.:3] Out: 900 [Urine:900]    Lab Results: BMET    Component Value Date/Time   NA 139 11/20/2020 0103   NA 142 11/19/2020 0225   NA 140 11/18/2020 0044   NA 145 (H) 06/07/2020 1019   NA 142 11/11/2018 1400   NA 144 11/23/2017 1706   K 4.2 11/20/2020 0103   K 3.8 11/19/2020 0225   K 3.5 11/18/2020 0044   CL 107 11/20/2020 0103   CL 109 11/19/2020 0225   CL 110 11/18/2020 0044   CO2 21 (L) 11/20/2020 0103   CO2 23 11/19/2020 0225   CO2 22 11/18/2020 0044   GLUCOSE 140 (H) 11/20/2020 0103   GLUCOSE 132 (H) 11/19/2020 0225   GLUCOSE 138 (H) 11/18/2020 0044   BUN 21 (H) 11/20/2020 0103   BUN 17 11/19/2020 0225   BUN 18 11/18/2020 0044   BUN 17 06/07/2020 1019   BUN 11 11/11/2018 1400   BUN 13 01/26/2018 1026   CREATININE 1.27 (H) 11/20/2020 0103    CREATININE 1.37 (H) 11/19/2020 0225   CREATININE 1.34 (H) 11/18/2020 0044   CALCIUM 8.8 (L) 11/20/2020 0103   CALCIUM 8.9 11/19/2020 0225   CALCIUM 8.3 (L) 11/18/2020 0044   GFRNONAA >60 11/20/2020 0103   GFRNONAA >60 11/19/2020 0225   GFRNONAA >60 11/18/2020 0044   GFRAA >60 04/21/2019 1049   GFRAA 87 11/11/2018 1400   GFRAA 85 01/26/2018 1026   CBC    Component Value Date/Time   WBC 7.0 11/19/2020 0225   RBC 4.10 (L) 11/19/2020 0225   HGB 10.6 (L) 11/19/2020 0225   HGB 10.6 (L) 10/15/2020 1025   HGB 14.2 08/02/2008 1026   HCT 31.8 (L) 11/19/2020 0225   HCT 31.8 (L) 10/15/2020 1025   HCT 41.6 08/02/2008 1026   PLT 304 11/19/2020 0225   PLT 211 10/15/2020 1025   MCV 77.6 (L) 11/19/2020 0225   MCV 78 (L) 10/15/2020 1025   MCV 78.4 (L) 08/02/2008 1026   MCH 25.9 (L) 11/19/2020 0225   MCHC 33.3 11/19/2020 0225   RDW 15.3 11/19/2020 0225   RDW 13.6 10/15/2020 1025   RDW 16.3 (H) 08/02/2008 1026   LYMPHSABS 1.3 11/14/2020 0133   LYMPHSABS 1.3 11/11/2018 1400   LYMPHSABS 0.6 (L) 08/02/2008 1026   MONOABS 0.8 11/14/2020 0133   MONOABS 0.2 08/02/2008  1026   EOSABS 0.0 11/14/2020 0133   EOSABS 0.1 11/11/2018 1400   BASOSABS 0.0 11/14/2020 0133   BASOSABS 0.0 11/11/2018 1400   BASOSABS 0.0 08/02/2008 1026   HEPATIC Function Panel Recent Labs    06/07/20 1019 11/14/20 0133  PROT 7.4 8.4*   HEMOGLOBIN A1C No components found for: HGA1C,  MPG CARDIAC ENZYMES Lab Results  Component Value Date   CKTOTAL 348 11/15/2020   BNP No results for input(s): PROBNP in the last 8760 hours. TSH Recent Labs    11/14/20 1246  TSH 1.076   CHOLESTEROL Recent Labs    06/07/20 1019  CHOL 141    Scheduled Meds:  amiodarone  200 mg Oral BID   atorvastatin  80 mg Oral Daily   bisacodyl  20 mg Oral Once   carvedilol  25 mg Oral BID WC   colchicine  0.6 mg Oral Daily   diltiazem  60 mg Oral Q6H   insulin aspart  0-9 Units Subcutaneous Q4H   oxybutynin  5 mg Oral BID    pantoprazole  40 mg Oral BID   sodium chloride flush  3 mL Intravenous Q12H   Continuous Infusions:  PRN Meds:.acetaminophen **OR** acetaminophen, acetaminophen-codeine, albuterol, Gerhardt's butt cream, LORazepam  Assessment/Plan:  Paroxysmal atrial fibrillation S/P right MCA stroke with left sided weakness Esophageal ulcer HTN Type 2 DM Obesity Gout CKD, II  Plan: Continue medical treatment. Avoid anticoagulation if has tendency to fall   LOS: 6 days   Time spent including chart review, lab review, examination, discussion with patient/Nurse/Mom : 30 min   Dixie Dials  MD  11/20/2020, 11:05 AM

## 2020-11-20 NOTE — Evaluation (Signed)
Physical Therapy Evaluation Patient Details Name: [name] MRN: [id number] DOB: May 07, 1965 Today's Date: 11/20/2020  History of Present Illness  Pt is a 55 y.o. male admitted 11/14/20 with fall from w/c with c/o L-side pain, no LOC; pt found to have afib with RVR, anemia. Imaging negative for acute injury. S/p colonoscopy and EGD 11/6 which showed polyp and esophageal ulcer. PMH includes CVA (uses w/c at baseline), HTN, DM2, diastolic dysfunction, gout, obesity.   Clinical Impression  Pt presents with an overall decrease in functional mobility secondary to above. PTA, pt typically requires PRN assist from wife for scooting and stand pivot transfers to/from wheelchair since prior stroke (2018) with residual L-side deficits; pt lives with wife and daughter, requires PRN assist for bathing. Today, pt requiring significantly increased assist compared to baseline PLOF; pt limited by decreased activity tolerance, generalized weakness and impaired balance. Pt notes he has not been OOB since admission. Increased time discussing d/c recommendations; feel pt would benefit from intensive CIR-level therapies to maximize functional mobility and independence prior to return home. If pt to return home, will need lift equipment, as well as physical assist +2 for mobility and ADLs.    Recommendations for follow up therapy are one component of a multi-disciplinary discharge planning process, led by the attending physician.  Recommendations may be updated based on patient status, additional functional criteria and insurance authorization.  Follow Up Recommendations Acute inpatient rehab (3hours/day)    Assistance Recommended at Discharge Frequent or constant Supervision/Assistance  Functional Status Assessment Patient has had a recent decline in their functional status and demonstrates the ability to make significant improvements in function in a reasonable and predictable amount of time.  Equipment  Recommendations   (TBD - if going home, will need lift equipment)    Recommendations for Other Services OT consult;Rehab consult     Precautions / Restrictions Precautions Precautions: Fall;Other (comment) Precaution Comments: H/o CVA with residual L hemiparesis Restrictions Weight Bearing Restrictions: No      Mobility  Bed Mobility Overal bed mobility: Needs Assistance Bed Mobility: Supine to Sit     Supine to sit: Mod assist;HOB elevated     General bed mobility comments: Increased time and effort, heavy use of bed rail, modA for trunk elevation and scooting hips to EOB    Transfers Overall transfer level: Needs assistance Equipment used: 1 person hand held assist Transfers: Sit to/from Stand Sit to Stand: Max assist;+2 physical assistance           General transfer comment: Able to minimally offload buttocks from EOB with maxA+1 for RUE HHA to elevate trunk, pt with difficulty assisting with RUE/RLE; additional trial with +2 assist, pt still unable to achieve fully upright standing; pt declines additional trials secondary to fatigue    Ambulation/Gait                  Stairs            Wheelchair Mobility    Modified Rankin (Stroke Patients Only)       Balance Overall balance assessment: Needs assistance   Sitting balance-Leahy Scale: Fair       Standing balance-Leahy Scale: Zero Standing balance comment: unable to achieve fully upright standing                             Pertinent Vitals/Pain Pain Assessment: Faces Faces Pain Scale: Hurts a little bit Pain Location: R knee Pain Descriptors /  Indicators: Discomfort Pain Intervention(s): Monitored during session;Repositioned    Home Living Family/patient expects to be discharged to:: Private residence Living Arrangements: Spouse/significant other;Children Available Help at Discharge: Family;Available 24 hours/day Type of Home: House Home Access: Stairs to  enter Entrance Stairs-Rails: None Entrance Stairs-Number of Steps: 1   Home Layout: One level Home Equipment: Cane - quad;BSC/3in1;Wheelchair - manual Additional Comments: Lives with wife and 56 y.o. daughter    Prior Function Prior Level of Function : Needs assist       Physical Assist : Mobility (physical);ADLs (physical) Mobility (physical): Bed mobility;Transfers;Stairs   Mobility Comments: Assist from wife PRN for bed mobility; typically performs stand pivots from bed<>wheelchair<>BSC with PRN assist from wife; assist to bump up 1 step into home in w/c. Wife drives. Enjoys reading and watching tv ADLs Comments: Sits EOB for bathing with assist from wife; typically able to don shoes/socks with adaptive aids; uses BSC for toileting     Hand Dominance   Dominant Hand: Right    Extremity/Trunk Assessment   Upper Extremity Assessment Upper Extremity Assessment: (P) Generalized weakness;LUE deficits/detail LUE Deficits / Details: (P) H/o L hemiparesis; <3/5 shoulder and elbow strength; DIP contractures LUE Coordination: (P) decreased fine motor;decreased gross motor    Lower Extremity Assessment Lower Extremity Assessment: (P) Generalized weakness;LLE deficits/detail LLE Deficits / Details: (P) H/o L hemiparesis; <3/5 hip and knee strength LLE Coordination: (P) decreased fine motor;decreased gross motor       Communication   Communication: Expressive difficulties  Cognition Arousal/Alertness: Awake/alert Behavior During Therapy: WFL for tasks assessed/performed;Flat affect Overall Cognitive Status: Within Functional Limits for tasks assessed                                 General Comments: WFL for simple tasks, not formally assessed; appears to have good insight into need for post-acute rehab before return home        General Comments General comments (skin integrity, edema, etc.): Increased time discussing d/c plan as pt with d/c orders for today. Of  note pt has not been OOB since admission 6 days prior. Pt aware he is unable to return with current functional mobility deficits, willing to pursue CIR/SNF    Exercises     Assessment/Plan    PT Assessment Patient needs continued PT services  PT Problem List Decreased strength;Decreased range of motion;Decreased activity tolerance;Decreased balance;Decreased mobility;Cardiopulmonary status limiting activity       PT Treatment Interventions DME instruction;Functional mobility training;Therapeutic activities;Therapeutic exercise;Balance training;Patient/family education;Wheelchair mobility training    PT Goals (Current goals can be found in the Care Plan section)  Acute Rehab PT Goals Patient Stated Goal: Agreeable to post-acute rehab to regain strength before home PT Goal Formulation: With patient Time For Goal Achievement: 12/04/20 Potential to Achieve Goals: Good    Frequency Min 3X/week   Barriers to discharge        Co-evaluation               AM-PAC PT "6 Clicks" Mobility  Outcome Measure Help needed turning from your back to your side while in a flat bed without using bedrails?: A Lot Help needed moving from lying on your back to sitting on the side of a flat bed without using bedrails?: A Lot Help needed moving to and from a bed to a chair (including a wheelchair)?: Total Help needed standing up from a chair using your arms (e.g., wheelchair or bedside  chair)?: Total Help needed to walk in hospital room?: Total Help needed climbing 3-5 steps with a railing? : Total 6 Click Score: 8    End of Session Equipment Utilized During Treatment: Gait belt Activity Tolerance: Patient tolerated treatment well Patient left: in bed;with call bell/phone within reach;with bed alarm set Nurse Communication: Mobility status;Need for lift equipment PT Visit Diagnosis: Other abnormalities of gait and mobility (R26.89);Muscle weakness (generalized) (M62.81);Other symptoms and signs  involving the nervous system (R84.128)    Time: 2081-3887 PT Time Calculation (min) (ACUTE ONLY): 34 min   Charges:   PT Evaluation $PT Eval Moderate Complexity: Carter, PT, DPT Acute Rehabilitation Services  Pager [phone] Office [place] 11/20/2020, 10:02 AM

## 2020-11-20 NOTE — Progress Notes (Signed)
Physical Therapy Treatment Patient Details Name: [name] MRN: [id number] DOB: 05/09/1965 Today's Date: 11/20/2020   History of Present Illness Pt is a 55 y.o. male admitted 11/14/20 with fall from w/c with c/o L-side pain, no LOC; pt found to have afib with RVR, anemia. Imaging negative for acute injury. S/p colonoscopy and EGD 11/6 which showed polyp and esophageal ulcer. PMH includes CVA (uses w/c at baseline), HTN, DM2, diastolic dysfunction, gout, obesity.   PT Comments    Pt seen for additional session to progress OOB mobility. Use of maximove for lift to recliner, then pt tolerating bouts of standing trials with modA+2 and stedy frame. Pt eager and motivated to participate, and happy to progress to OOB to eat dinner. Pt remains limited by generalized weakness, decreased activity tolerance, and impaired balance strategies/postural reactions. Continue to recommend intensive CIR-level therapies to maximize functional mobility and independence prior to return home.    Recommendations for follow up therapy are one component of a multi-disciplinary discharge planning process, led by the attending physician.  Recommendations may be updated based on patient status, additional functional criteria and insurance authorization.  Follow Up Recommendations  Acute inpatient rehab (3hours/day)     Assistance Recommended at Discharge Frequent or constant Supervision/Assistance  Equipment Recommendations   (TBD; if home, will need lift equipment)    Recommendations for Other Services       Precautions / Restrictions Precautions Precautions: Fall;Other (comment) Precaution Comments: H/o CVA with residual L hemiparesis Restrictions Weight Bearing Restrictions: No     Mobility  Bed Mobility Overal bed mobility: Needs Assistance Bed Mobility: Rolling Rolling: Min assist;Mod assist;+2 for safety/equipment;+2 for physical assistance         General bed mobility comments: Min A for  rolling to L; cues for pulling with RUE and pushing with RLE. Mod A for rolling R-side    Transfers Overall transfer level: Needs assistance Equipment used: Ambulation equipment used Transfers: Sit to/from Stand;Bed to chair/wheelchair/BSC Sit to Stand: Mod assist;+2 physical assistance           General transfer comment: Use of maxi move to transfer pt to recliner. Once in recliner, pt very motivated to attempt sit<>stand. Use of stedy to practice sit<>Stand with cues for forward weight shift and standing posture. Mod A +2 for power up into standing from reclienr seat and Mod A for sit<>stand from stedy seat; mod-totalA for LUE management on stedy bar Transfer via Lift Equipment: Maximove  Ambulation/Gait                   Stairs             Wheelchair Mobility    Modified Rankin (Stroke Patients Only)       Balance Overall balance assessment: Needs assistance Sitting-balance support: No upper extremity supported;Feet supported Sitting balance-Leahy Scale: Fair       Standing balance-Leahy Scale: Poor Standing balance comment: Reliant on use of stedy, RUE support and external assist                            Cognition Arousal/Alertness: Awake/alert Behavior During Therapy: WFL for tasks assessed/performed;Flat affect Overall Cognitive Status: Within Functional Limits for tasks assessed                                 General Comments: WFL for simple tasks, not formally assessed; appears  to have good insight into need for post-acute rehab before return home        Exercises Other Exercises Other Exercises: seated in recliner - RLE LAQ, LLE LAQ through partial range    General Comments General comments (skin integrity, edema, etc.): SpO2 96% on RA      Pertinent Vitals/Pain Pain Assessment: No/denies pain Faces Pain Scale: Hurts a little bit Pain Location: R knee Pain Descriptors / Indicators: Discomfort Pain  Intervention(s): Monitored during session    Home Living Family/patient expects to be discharged to:: Private residence Living Arrangements: Spouse/significant other;Children Available Help at Discharge: Family;Available 24 hours/day Type of Home: House Home Access: Stairs to enter Entrance Stairs-Rails: None Entrance Stairs-Number of Steps: 1   Home Layout: One level Home Equipment: Cane - quad;BSC/3in1;Wheelchair - manual Additional Comments: Lives with wife and 30 y.o. daughter    Prior Function            PT Goals (current goals can now be found in the care plan section) Progress towards PT goals: Progressing toward goals    Frequency    Min 3X/week      PT Plan Current plan remains appropriate    Co-evaluation              AM-PAC PT "6 Clicks" Mobility   Outcome Measure  Help needed turning from your back to your side while in a flat bed without using bedrails?: A Lot Help needed moving from lying on your back to sitting on the side of a flat bed without using bedrails?: A Lot Help needed moving to and from a bed to a chair (including a wheelchair)?: Total Help needed standing up from a chair using your arms (e.g., wheelchair or bedside chair)?: Total Help needed to walk in hospital room?: Total Help needed climbing 3-5 steps with a railing? : Total 6 Click Score: 8    End of Session Equipment Utilized During Treatment: Gait belt Activity Tolerance: Patient tolerated treatment well Patient left: in chair;with call bell/phone within reach Nurse Communication: Mobility status;Need for lift equipment PT Visit Diagnosis: Other abnormalities of gait and mobility (R26.89);Muscle weakness (generalized) (M62.81);Other symptoms and signs involving the nervous system (R29.898)     Time: 3154-0086 PT Time Calculation (min) (ACUTE ONLY): 25 min  Charges:  $Therapeutic Activity: 8-22 mins                     Mabeline Caras, PT, DPT Acute Rehabilitation  Services  Pager [phone] Office [place] 11/20/2020, 5:33 PM

## 2020-11-20 NOTE — TOC Progression Note (Signed)
Transition of Care [facility]) - Progression Note    Patient Details  Name: [name] MRN: [id number] Date of Birth: 1965/06/27  [facility] CM/SW Contact  Zenon Mayo, RN Phone Number: 11/20/2020, 4:19 PM  Clinical Narrative:    Patient is for CIR , Occupational therapy has been ordered and they need to see patient before can get auth for CIR.  This NCM spoke with Mickel Baas , Cir coordinator she states she may have the decision as early as tomorrow evening if Occupational therapy sees him in the am, but she will not have a bed on Wed, but may have a bed on Friday.  NCM spoke with patient as well , if CIR is not approved he would like to go home with [facility] with [facility] for [place].  This NCM has spoken with Tommi Rumps with [facility] regarding this information. He will take referral for [facility] if his CIR is not approved.  TOC to continue to follow for dc needs.    Expected Discharge Plan: IP Rehab Facility Barriers to Discharge: Continued Medical Work up  Expected Discharge Plan and Services Expected Discharge Plan: [place] In-house Referral: NA Discharge Planning Services: CM Consult Post Acute Care Choice: IP Rehab Living arrangements for the past 2 months: Single Family Home Expected Discharge Date: 11/20/20                 DME Agency: NA                   Social Determinants of Health (SDOH) Interventions    Readmission Risk Interventions No flowsheet data found.

## 2020-11-20 NOTE — Discharge Summary (Addendum)
Physician Discharge Summary  ISSACC MERLO YTK:[id number] DOB: 1965/03/01 DOA: 11/14/2020  PCP: Ladell Pier, MD  Admit date: 11/14/2020 Discharge date: 11/23/2020  Admitted From: Home Disposition:  CIR  Recommendations for Outpatient Follow-up:  Follow up with cardiology in 4 weeks. Follow-up with GI in 8 weeks to repeat EGD. Follow-up with neurology in 2 weeks to see if he is a candidate to resume aspirin. Please obtain BMP/CBC in one week   Home Health:No Equipment/Devices:none  Discharge Condition:Stable CODE STATUS:Full Diet recommendation: Heart Healthy    Brief/Interim Summary: 55 y.o. male past medical history significant for essential hypertension, chronic diastolic heart failure CVA with left residual weakness diabetes mellitus type 2 seizure disorder history of rectal cancer status post resection with radiation and chemotherapy comes in complaining of left-sided pain.  At baseline gets around with a wheelchair last night when he was trying to transition from wheelchair to bed he missed and fell he did not lose consciousness during the ED he was found on A. fib with RVR in 140s blood pressure 86/63-127/111 influenza and COVID were negative CT scan of the chest abdomen and pelvis was negative for any acute abnormalities he was given a liter normal saline and started on Cardizem drip.  Discharge Diagnoses:  Principal Problem:   Atrial fibrillation with RVR ([place]) Active Problems:   Hypertension   Type 2 diabetes mellitus with vascular disease (HCC)   Diastolic dysfunction   AKI (acute kidney injury) ([place])   History of CVA with residual deficit   Gout   Microcytic hypochromic anemia   History of meningioma   Obesity (BMI 30.0-34.9)   Nausea and vomiting   History of colonic polyps  New onset atrial fibrillation with RVR: With a chads Vascor of at least 4, he was started on IV diltiazem continue on Coreg despite this his heart rate was hard to control he was  started on amiodarone cardiology was consulted they agreed he converted back to sinus rhythm, 2D echo showed an EF of 60%. Question if his A. fib is probably due to his acute gout flare. GI was consulted due to concern for bleeding see below for further details. He will start a DOAC as an outpatient after repeated EGD per cardiology's recommendations.  Acute blood loss anemia/microcytic anemia: FOBT negative GI was consulted perform endoscopy that showed an esophageal ulcer. He was continued on Protonix twice a day GI recommended repeat EGD in 6 weeks. Colonoscopy showed a polyp which was dissected. Follow-up with GI in 8 weeks to repeat an EGD.  Acute kidney injury: With a baseline creatinine of 1.2 on admission 2.0 in the setting of ACE inhibitor and NSAIDs these were held he was fluid resuscitated creatinine returned to baseline.  Diabetes mellitus type 2: With an A1c of 6.7 no changes made he will continue his metformin as an outpatient.  Left flank pain: Sign secondary to fall CT scan of the chest abdomen pelvis showed no acute findings.  History of gout flare: He was treated with colchicine and he was told to avoid NSAIDs.  Essential hypertension: He was continued on Coreg ACE inhibitor and diltiazem.  History of diastolic dysfunction: Appears to be euvolemic on physical exam no changes made to his medication.  History of CVA: Continue Lipitor. He will follow-up with neurology as an outpatient and probably resume aspirin low-dose in 2 to 4 weeks.  History of hemangioma: Stable on CT scan.  Discharge Instructions  Discharge Instructions     Diet - low  sodium heart healthy   Complete by: As directed    Increase activity slowly   Complete by: As directed       Allergies as of 11/20/2020   No Known Allergies      Medication List     STOP taking these medications    acetaminophen 500 MG tablet Commonly known as: TYLENOL   amLODipine 10 MG tablet Commonly  known as: NORVASC   aspirin 325 MG tablet   diclofenac Sodium 1 % Gel Commonly known as: Voltaren       TAKE these medications    acetaminophen-codeine 300-30 MG tablet Commonly known as: TYLENOL #3 Take 1 tablet by mouth every 8 (eight) hours as needed for moderate pain.   amiodarone 200 MG tablet Commonly known as: PACERONE Take 1 tablet (200 mg total) by mouth 2 (two) times daily.   atorvastatin 80 MG tablet Commonly known as: LIPITOR TAKE 1 TABLET BY MOUTH EVERY DAY   carvedilol 25 MG tablet Commonly known as: COREG Take 1 tablet (25 mg total) by mouth 2 (two) times daily with a meal.   diltiazem 240 MG 24 hr capsule Commonly known as: Cartia XT Take 1 capsule (240 mg total) by mouth daily.   metFORMIN 500 MG tablet Commonly known as: GLUCOPHAGE TAKE 1 TABLET BY MOUTH EVERY DAY WITH BREAKFAST What changed: See the new instructions.   olmesartan 20 MG tablet Commonly known as: BENICAR TAKE 1 TABLET BY MOUTH EVERY DAY   oxybutynin 5 MG tablet Commonly known as: DITROPAN TAKE 1 TABLET BY MOUTH TWICE A DAY   pantoprazole 40 MG tablet Commonly known as: PROTONIX Take 1 tablet (40 mg total) by mouth 2 (two) times daily.        No Known Allergies  Consultations: Cardiology Neurology   Procedures/Studies: DG Abd Portable 1V  Result Date: 11/16/2020 CLINICAL DATA:  Nausea and vomiting with abdominal distension. EXAM: PORTABLE ABDOMEN - 1 VIEW COMPARISON:  CT chest abdomen and pelvis 11/14/2020. FINDINGS: The stomach is markedly distended and air-filled, a new finding. Air is seen within nondilated large and small bowel to the level of the sigmoid. There is a phlebolith left in the left hemipelvis. Degenerative changes affect the right hip. Left hip arthroplasty is present. IMPRESSION: 1. New marked gaseous distention of the stomach. Electronically Signed   By: Ronney Asters M.D.   On: 11/16/2020 15:54   ECHOCARDIOGRAM COMPLETE  Result Date: 11/14/2020     ECHOCARDIOGRAM REPORT   Patient Name:   EMAAD NANNA [facility] Date of Exam: 11/14/2020 Medical Rec #:  [id number]         Height:       64.0 in Accession #:    [id number]        Weight:       200.6 lb Date of Birth:  1965-11-26         BSA:          1.959 m Patient Age:    55 years          BP:           111/81 mmHg Patient Gender: M                 HR:           85 bpm. Exam Location:  Inpatient Procedure: 2D Echo, Cardiac Doppler, Color Doppler and Intracardiac            Opacification Agent Indications:    I48.1 Persistent atrial  fibrillation  History:        Patient has no prior history of Echocardiogram examinations.                 Risk Factors:Hypertension and Diabetes.  Sonographer:    Glo Herring Referring Phys: [id number] [place]  1. Left ventricular ejection fraction, by estimation, is 60 to 65%. The left ventricle has normal function. The left ventricle has no regional wall motion abnormalities. There is mild concentric left ventricular hypertrophy. Left ventricular diastolic parameters are consistent with Grade I diastolic dysfunction (impaired relaxation).  2. Right ventricular systolic function is normal. The right ventricular size is normal.  3. Left atrial size was mildly dilated.  4. Right atrial size was mildly dilated.  5. The mitral valve is normal in structure. Trivial mitral valve regurgitation.  6. The aortic valve is tricuspid. Aortic valve regurgitation is not visualized.  7. The inferior vena cava is normal in size with greater than 50% respiratory variability, suggesting right atrial pressure of 3 mmHg. FINDINGS  Left Ventricle: Left ventricular ejection fraction, by estimation, is 60 to 65%. The left ventricle has normal function. The left ventricle has no regional wall motion abnormalities. The left ventricular internal cavity size was normal in size. There is  mild concentric left ventricular hypertrophy. Left ventricular diastolic parameters are consistent with Grade I  diastolic dysfunction (impaired relaxation). Right Ventricle: The right ventricular size is normal. No increase in right ventricular wall thickness. Right ventricular systolic function is normal. Left Atrium: Left atrial size was mildly dilated. Right Atrium: Right atrial size was mildly dilated. Pericardium: There is no evidence of pericardial effusion. Mitral Valve: The mitral valve is normal in structure. Trivial mitral valve regurgitation. Tricuspid Valve: The tricuspid valve is normal in structure. Tricuspid valve regurgitation is trivial. Aortic Valve: The aortic valve is tricuspid. Aortic valve regurgitation is not visualized. Aortic valve mean gradient measures 4.5 mmHg. Aortic valve peak gradient measures 8.6 mmHg. Pulmonic Valve: The pulmonic valve was normal in structure. Pulmonic valve regurgitation is not visualized. Aorta: The aortic root is normal in size and structure. Venous: The inferior vena cava is normal in size with greater than 50% respiratory variability, suggesting right atrial pressure of 3 mmHg. IAS/Shunts: The atrial septum is grossly normal.  LEFT VENTRICLE PLAX 2D LVIDd:         4.00 cm Diastology LVIDs:         2.40 cm LV e' medial:    5.77 cm/s LV PW:         1.30 cm LV E/e' medial:  13.4 LV IVS:        1.30 cm LV e' lateral:   9.03 cm/s                        LV E/e' lateral: 8.5  RIGHT VENTRICLE             IVC RV Basal diam:  3.40 cm     IVC diam: 1.10 cm RV S prime:     17.50 cm/s LEFT ATRIUM             Index        RIGHT ATRIUM           Index LA diam:        3.00 cm 1.53 cm/m   RA Area:     15.40 cm LA Vol (A2C):   54.5 ml 27.82 ml/m  RA Volume:  39.30 ml  20.06 ml/m LA Vol (A4C):   25.2 ml 12.86 ml/m LA Biplane Vol: 38.4 ml 19.60 ml/m  AORTIC VALVE                    PULMONIC VALVE AV Vmax:           146.50 cm/s  PV Vmax:       1.03 m/s AV Vmean:          100.250 cm/s PV Peak grad:  4.2 mmHg AV VTI:            0.242 m AV Peak Grad:      8.6 mmHg AV Mean Grad:      4.5  mmHg LVOT Vmax:         120.00 cm/s LVOT Vmean:        87.500 cm/s LVOT VTI:          0.213 m LVOT/AV VTI ratio: 0.88  AORTA Ao Root diam: 3.10 cm Ao Asc diam:  3.20 cm MITRAL VALVE MV Area (PHT): 3.27 cm    SHUNTS MV Decel Time: 232 msec    Systemic VTI: 0.21 m MV E velocity: 77.10 cm/s MV A velocity: 79.70 cm/s MV E/A ratio:  0.97 Dixie Dials MD Electronically signed by Dixie Dials MD Signature Date/Time: 11/14/2020/12:26:04 PM    Final    CT CHEST ABDOMEN PELVIS WO CONTRAST  Result Date: 11/14/2020 CLINICAL DATA:  Chest trauma EXAM: CT CHEST, ABDOMEN AND PELVIS WITHOUT CONTRAST TECHNIQUE: Multidetector CT imaging of the chest, abdomen and pelvis was performed following the standard protocol without IV contrast. COMPARISON:  None. FINDINGS: CT CHEST FINDINGS Cardiovascular: Heart size is normal without pericardial effusion. The thoracic aorta is normal in course and caliber without dissection, aneurysm, ulceration or intramural hematoma. There is calcific aortic atherosclerosis. Mediastinum/Nodes: No mediastinal hematoma. No mediastinal, hilar or axillary lymphadenopathy. The visualized thyroid and thoracic esophageal course are unremarkable. Lungs/Pleura: No pulmonary contusion, pneumothorax or pleural effusion. The central airways are clear. Musculoskeletal: No acute fracture of the ribs, sternum or the visible portions of clavicles and scapulae. CT ABDOMEN PELVIS FINDINGS Hepatobiliary: No hepatic hematoma or laceration. No biliary dilatation. Cholelithiasis without acute inflammation. Pancreas: Normal contours without ductal dilatation. No peripancreatic fluid collection. Spleen: No splenic laceration or hematoma. Adrenals/Urinary Tract: --Adrenal glands: No adrenal hemorrhage. --Right kidney/ureter: No hydronephrosis or perinephric hematoma. --Left kidney/ureter: No hydronephrosis or perinephric hematoma. --Urinary bladder: Unremarkable. Stomach/Bowel: --Stomach/Duodenum: No hiatal hernia or other  gastric abnormality. Normal duodenal course and caliber. --Small bowel: No dilatation or inflammation. --Colon: No focal abnormality. --Appendix: Normal. Vascular/Lymphatic: Atherosclerotic calcification is present within the non-aneurysmal abdominal aorta, without hemodynamically significant stenosis. No abdominal or pelvic lymphadenopathy. Reproductive: Normal prostate and seminal vesicles. Musculoskeletal. No pelvic fractures. Other: None. IMPRESSION: 1. No acute abnormality of the chest, abdomen or pelvis. 2. Cholelithiasis without acute inflammation. Aortic Atherosclerosis (ICD10-I70.0). Electronically Signed   By: Ulyses Jarred M.D.   On: 11/14/2020 02:50   (Echo, Carotid, EGD, Colonoscopy, ERCP)    Subjective: No complaints  Discharge Exam: Vitals:   11/20/20 0412 11/20/20 0552  BP: 137/89 (!) 147/98  Pulse: 86   Resp: 20   Temp:    SpO2: 98%    Vitals:   11/19/20 2325 11/20/20 0059 11/20/20 0412 11/20/20 0552  BP: (!) 137/95 (!) 148/102 137/89 (!) 147/98  Pulse: 88  86   Resp: 19  20   Temp: 98.6 F (37 C)     TempSrc: Oral  Oral   SpO2: 93%  98%   Weight:    83 kg  Height:        General: Pt is alert, awake, not in acute distress Cardiovascular: RRR, S1/S2 +, no rubs, no gallops Respiratory: CTA bilaterally, no wheezing, no rhonchi Abdominal: Soft, NT, ND, bowel sounds + Extremities: no edema, no cyanosis    The results of significant diagnostics from this hospitalization (including imaging, microbiology, ancillary and laboratory) are listed below for reference.     Microbiology: Recent Results (from the past 240 hour(s))  Resp Panel by RT-PCR (Flu A&B, Covid) Nasopharyngeal Swab     Status: None   Collection Time: 11/14/20  4:02 AM   Specimen: Nasopharyngeal Swab; Nasopharyngeal(NP) swabs in vial transport medium  Result Value Ref Range Status   SARS Coronavirus 2 by RT PCR NEGATIVE NEGATIVE Final    Comment: (NOTE) SARS-CoV-2 target nucleic acids are NOT  DETECTED.  The SARS-CoV-2 RNA is generally detectable in upper respiratory specimens during the acute phase of infection. The lowest concentration of SARS-CoV-2 viral copies this assay can detect is 138 copies/mL. A negative result does not preclude SARS-Cov-2 infection and should not be used as the sole basis for treatment or other patient management decisions. A negative result may occur with  improper specimen collection/handling, submission of specimen other than nasopharyngeal swab, presence of viral mutation(s) within the areas targeted by this assay, and inadequate number of viral copies(<138 copies/mL). A negative result must be combined with clinical observations, patient history, and epidemiological information. The expected result is Negative.  Fact Sheet for Patients:  EntrepreneurPulse.com.au  Fact Sheet for Healthcare Providers:  IncredibleEmployment.be  This test is no t yet approved or cleared by the [place] FDA and  has been authorized for detection and/or diagnosis of SARS-CoV-2 by FDA under an Emergency Use Authorization (EUA). This EUA will remain  in effect (meaning this test can be used) for the duration of the COVID-19 declaration under Section 564(b)(1) of the Act, 21 U.S.C.section 360bbb-3(b)(1), unless the authorization is terminated  or revoked sooner.       Influenza A by PCR NEGATIVE NEGATIVE Final   Influenza B by PCR NEGATIVE NEGATIVE Final    Comment: (NOTE) The Xpert Xpress SARS-CoV-2/FLU/RSV plus assay is intended as an aid in the diagnosis of influenza from Nasopharyngeal swab specimens and should not be used as a sole basis for treatment. Nasal washings and aspirates are unacceptable for Xpert Xpress SARS-CoV-2/FLU/RSV testing.  Fact Sheet for Patients: EntrepreneurPulse.com.au  Fact Sheet for Healthcare Providers: IncredibleEmployment.be  This test is not yet  approved or cleared by the [place] FDA and has been authorized for detection and/or diagnosis of SARS-CoV-2 by FDA under an Emergency Use Authorization (EUA). This EUA will remain in effect (meaning this test can be used) for the duration of the COVID-19 declaration under Section 564(b)(1) of the Act, 21 U.S.C. section 360bbb-3(b)(1), unless the authorization is terminated or revoked.  Performed at [facility], [address], [place], [place] [address]   MRSA Next Gen by PCR, Nasal     Status: None   Collection Time: 11/14/20  5:38 AM   Specimen: Nasal Mucosa; Nasal Swab  Result Value Ref Range Status   MRSA by PCR Next Gen NOT DETECTED NOT DETECTED Final    Comment: (NOTE) The GeneXpert MRSA Assay (FDA approved for NASAL specimens only), is one component of a comprehensive MRSA colonization surveillance program. It is not intended to diagnose MRSA infection nor  to guide or monitor treatment for MRSA infections. Test performance is not FDA approved in patients less than 6 years old. Performed at [facility], [place] [address] [place], [place] [address]      Labs: BNP (last 3 results) No results for input(s): BNP in the last 8760 hours. Basic Metabolic Panel: Recent Labs  Lab 11/14/20 0603 11/15/20 0111 11/16/20 0111 11/17/20 0052 11/18/20 0044 11/19/20 0225 11/20/20 0103  NA  --  141 138  --  140 142 139  K  --  4.3 3.7  --  3.5 3.8 4.2  CL  --  111 106  --  110 109 107  CO2  --  21* 22  --  22 23 21*  GLUCOSE  --  134* 159*  --  138* 132* 140*  BUN  --  23* 13  --  18 17 21*  CREATININE  --  1.40* 1.19  --  1.34* 1.37* 1.27*  CALCIUM  --  8.4* 8.6*  --  8.3* 8.9 8.8*  MG 2.4  --  1.7 2.4  --   --   --    Liver Function Tests: Recent Labs  Lab 11/14/20 0133  AST 19  ALT 16  ALKPHOS 65  BILITOT 0.9  PROT 8.4*  ALBUMIN 4.0   No results for input(s): LIPASE, AMYLASE in the last 168 hours. No results for input(s): AMMONIA in  the last 168 hours. CBC: Recent Labs  Lab 11/14/20 0133 11/15/20 0111 11/16/20 0111 11/17/20 0052 11/18/20 0044 11/19/20 0225  WBC 8.9 6.5 9.2 10.2 7.5 7.0  NEUTROABS 6.8  --   --   --   --   --   HGB 11.3* 9.5* 10.8* 10.1* 10.3* 10.6*  HCT 34.9* 29.0* 32.2* 30.1* 30.9* 31.8*  MCV 79.7* 79.5* 77.8* 76.6* 78.0* 77.6*  PLT 274 227 256 261 237 304   Cardiac Enzymes: Recent Labs  Lab 11/14/20 0603 11/15/20 0111  CKTOTAL 707* 348   BNP: Invalid input(s): POCBNP CBG: Recent Labs  Lab 11/19/20 1138 11/19/20 1610 11/19/20 2007 11/20/20 0014 11/20/20 0455  GLUCAP 163* 139* 141* 155* 121*   D-Dimer No results for input(s): DDIMER in the last 72 hours. Hgb A1c No results for input(s): HGBA1C in the last 72 hours. Lipid Profile No results for input(s): CHOL, HDL, LDLCALC, TRIG, CHOLHDL, LDLDIRECT in the last 72 hours. Thyroid function studies No results for input(s): TSH, T4TOTAL, T3FREE, THYROIDAB in the last 72 hours.  Invalid input(s): FREET3 Anemia work up No results for input(s): VITAMINB12, FOLATE, FERRITIN, TIBC, IRON, RETICCTPCT in the last 72 hours. Urinalysis    Component Value Date/Time   COLORURINE YELLOW 11/14/2020 1359   APPEARANCEUR CLEAR 11/14/2020 1359   LABSPEC 1.014 11/14/2020 1359   PHURINE 5.0 11/14/2020 1359   GLUCOSEU NEGATIVE 11/14/2020 1359   HGBUR NEGATIVE 11/14/2020 1359   BILIRUBINUR NEGATIVE 11/14/2020 1359   KETONESUR NEGATIVE 11/14/2020 1359   PROTEINUR NEGATIVE 11/14/2020 1359   UROBILINOGEN 0.2 07/25/2014 1459   NITRITE NEGATIVE 11/14/2020 1359   LEUKOCYTESUR NEGATIVE 11/14/2020 1359   Sepsis Labs Invalid input(s): PROCALCITONIN,  WBC,  LACTICIDVEN Microbiology Recent Results (from the past 240 hour(s))  Resp Panel by RT-PCR (Flu A&B, Covid) Nasopharyngeal Swab     Status: None   Collection Time: 11/14/20  4:02 AM   Specimen: Nasopharyngeal Swab; Nasopharyngeal(NP) swabs in vial transport medium  Result Value Ref Range Status    SARS Coronavirus 2 by RT PCR NEGATIVE NEGATIVE Final  Comment: (NOTE) SARS-CoV-2 target nucleic acids are NOT DETECTED.  The SARS-CoV-2 RNA is generally detectable in upper respiratory specimens during the acute phase of infection. The lowest concentration of SARS-CoV-2 viral copies this assay can detect is 138 copies/mL. A negative result does not preclude SARS-Cov-2 infection and should not be used as the sole basis for treatment or other patient management decisions. A negative result may occur with  improper specimen collection/handling, submission of specimen other than nasopharyngeal swab, presence of viral mutation(s) within the areas targeted by this assay, and inadequate number of viral copies(<138 copies/mL). A negative result must be combined with clinical observations, patient history, and epidemiological information. The expected result is Negative.  Fact Sheet for Patients:  EntrepreneurPulse.com.au  Fact Sheet for Healthcare Providers:  IncredibleEmployment.be  This test is no t yet approved or cleared by the [place] FDA and  has been authorized for detection and/or diagnosis of SARS-CoV-2 by FDA under an Emergency Use Authorization (EUA). This EUA will remain  in effect (meaning this test can be used) for the duration of the COVID-19 declaration under Section 564(b)(1) of the Act, 21 U.S.C.section 360bbb-3(b)(1), unless the authorization is terminated  or revoked sooner.       Influenza A by PCR NEGATIVE NEGATIVE Final   Influenza B by PCR NEGATIVE NEGATIVE Final    Comment: (NOTE) The Xpert Xpress SARS-CoV-2/FLU/RSV plus assay is intended as an aid in the diagnosis of influenza from Nasopharyngeal swab specimens and should not be used as a sole basis for treatment. Nasal washings and aspirates are unacceptable for Xpert Xpress SARS-CoV-2/FLU/RSV testing.  Fact Sheet for  Patients: EntrepreneurPulse.com.au  Fact Sheet for Healthcare Providers: IncredibleEmployment.be  This test is not yet approved or cleared by the [place] FDA and has been authorized for detection and/or diagnosis of SARS-CoV-2 by FDA under an Emergency Use Authorization (EUA). This EUA will remain in effect (meaning this test can be used) for the duration of the COVID-19 declaration under Section 564(b)(1) of the Act, 21 U.S.C. section 360bbb-3(b)(1), unless the authorization is terminated or revoked.  Performed at [facility], [address], [place], [place] [address]   MRSA Next Gen by PCR, Nasal     Status: None   Collection Time: 11/14/20  5:38 AM   Specimen: Nasal Mucosa; Nasal Swab  Result Value Ref Range Status   MRSA by PCR Next Gen NOT DETECTED NOT DETECTED Final    Comment: (NOTE) The GeneXpert MRSA Assay (FDA approved for NASAL specimens only), is one component of a comprehensive MRSA colonization surveillance program. It is not intended to diagnose MRSA infection nor to guide or monitor treatment for MRSA infections. Test performance is not FDA approved in patients less than 58 years old. Performed at [facility], [place] [address] [place], [place] [address]      SIGNED:   Charlynne Cousins, MD  [facility] 11/20/2020, 7:17 AM Pager   If 7PM-7AM, please contact night-coverage [email or web address] Password [email or web address]

## 2020-11-21 ENCOUNTER — Telehealth: Payer: Self-pay | Admitting: Internal Medicine

## 2020-11-21 DIAGNOSIS — Z86018 Personal history of other benign neoplasm: Secondary | ICD-10-CM

## 2020-11-21 LAB — BASIC METABOLIC PANEL
Anion gap: 14 (ref 5–15)
BUN: 21 mg/dL — ABNORMAL HIGH (ref 6–20)
CO2: 17 mmol/L — ABNORMAL LOW (ref 22–32)
Calcium: 8.5 mg/dL — ABNORMAL LOW (ref 8.9–10.3)
Chloride: 104 mmol/L (ref 98–111)
Creatinine, Ser: 1.2 mg/dL (ref 0.61–1.24)
GFR, Estimated: >60 mL/min (ref >60)
Glucose, Bld: 112 mg/dL — ABNORMAL HIGH (ref 70–99)
Potassium: 4 mmol/L (ref 3.5–5.1)
Sodium: 135 mmol/L (ref 135–145)

## 2020-11-21 LAB — GLUCOSE, CAPILLARY
Glucose-Capillary: 166 mg/dL — ABNORMAL HIGH (ref 70–99)
Glucose-Capillary: 143 mg/dL — ABNORMAL HIGH (ref 70–99)
Glucose-Capillary: 213 mg/dL — ABNORMAL HIGH (ref 70–99)
Glucose-Capillary: 141 mg/dL — ABNORMAL HIGH (ref 70–99)
Glucose-Capillary: 144 mg/dL — ABNORMAL HIGH (ref 70–99)

## 2020-11-21 NOTE — TOC Progression Note (Signed)
Transition of Care [facility]) - Progression Note    Patient Details  Name: EROS MONTOUR MRN: [id number] Date of Birth: 02-13-1965  [facility] of [facility]) CM/SW Contact  Carles Collet, RN Phone Number: 11/21/2020, 11:21 AM  Clinical Narrative:    Damaris Schooner w patient and mother at bedside. Provided with Eliquis 30 day and copay reduction cards. Dispo planning CIR in progress.     Expected Discharge Plan: IP Rehab Facility Barriers to Discharge: Continued Medical Work up  Expected Discharge Plan and Services Expected Discharge Plan: [place] In-house Referral: NA Discharge Planning Services: CM Consult Post Acute Care Choice: IP Rehab Living arrangements for the past 2 months: Single Family Home Expected Discharge Date: 11/20/20                 DME Agency: NA                   Social Determinants of Health (SDOH) Interventions    Readmission Risk Interventions No flowsheet data found.

## 2020-11-21 NOTE — PMR Pre-admission (Signed)
PMR Admission Coordinator Pre-Admission Assessment  Patient: [name] is an 55 y.o., male MRN: [id number] DOB: Sep 28, 1965 Height: _0  (162.6 cm) Weight: 80.3 kg  Insurance Information HMO:     PPO: yes     PCP:      IPA:      80/20:      OTHER:  PRIMARY: BC[employer] Commercial       Policy#: [id number]      Subscriber: Pt.  Cheryl with [employer] called with approval 11/23/20 for admission 11/23/20-12/03/20 with updates due 11/21.  CM Name: Malachy Mood      Phone#: [phone]     Fax#: [phone]    Pre-Cert#: [id number]       Employer:  Benefits:  Phone #: [phone]     Name: Henrine Screws Date: 01/14/2020 - 01/12/2021 Deductible: $4,000 ($4,000 met) OOP Max: $6,900 ($5,232.48 met) CIR: 80% coverage, 20% co-insurance SNF: 80% coverage, 20% co-insurance; with a limit of 100 visits/cal year (100 remaining) Outpatient:  80% coverage, 20% co-insurance; combined 60 visit limit combined/cal yr (12 remaining) Home Health: 80% coverage, 20% co-insurance; with a limit of 100 visits/cal year (100 remaining) DME: 80% coverage, 20% co-insurance Providers: in network   SECONDARY:       Policy#:      Phone#:    Development worker, community:      Phone#:   The Engineer, petroleum" for patients in Inpatient Rehabilitation Facilities with attached "Privacy Act [place] Records" was provided and verbally reviewed with: N/A  Emergency Contact Information Contact Information     Name Relation Home Work Mobile   [place] Spouse [phone]  [phone]       Current Medical History  Patient Admitting Diagnosis: Cardiac Debility History of Present Illness: 55 year old male with history of hypertension, chronic diastolic CHF, CVA with left residual weakness, diabetes mellitus type 2, seizure disorder, history of rectal cancer s/p resection with radiation and chemotherapy came in complaining of left-sided pain.  At baseline he gets around with a wheelchair, before  he came to ED was trying to transition from wheelchair to bed and he missed and fell.  Did not lose consciousness.  He was brought to ED.  He was found to be in A. fib with RVR with heart rate in 140s.  CT scan of the chest/abdomen/pelvis was negative for acute abnormality.  He was started on Cardizem drip. Pt. Was min A with at wheelchair level but has experienced a decline in function since admission. CIR was consulted to assist in return to PLOF.     Patient's medical record from [facility] has been reviewed by the rehabilitation admission coordinator and physician.  Past Medical History  Past Medical History:  Diagnosis Date   Colon cancer ([place]) 2009   rectal   Diabetes mellitus without complication ([place])    Gait abnormality 03/31/2018   Hemiparesis and speech and language deficit as late effects of stroke ([place]) 03/31/2018   Hypertension    Seizure ([place])    Stroke ([place]) 2018    Has the patient had major surgery during 100 days prior to admission? No  Family History   family history includes Colon cancer in his maternal grandmother; Diabetes in his maternal grandmother; Heart disease in his mother; Heart failure in his mother; Hyperlipidemia in his mother; Hypertension in his brother, father, mother, and sister; Stroke in his father.  Current Medications  Current Facility-Administered Medications:    acetaminophen (TYLENOL) tablet 650 mg, 650 mg, Oral, Q6H  PRN, 650 mg at 11/15/20 2339 **OR** acetaminophen (TYLENOL) suppository 650 mg, 650 mg, Rectal, Q6H PRN, Tamala Julian, Rondell A, MD   acetaminophen-codeine (TYLENOL #3) 300-30 MG per tablet 1 tablet, 1 tablet, Oral, Q8H PRN, Fuller Plan A, MD, 1 tablet at 11/17/20 2053   albuterol (PROVENTIL) (2.5 MG/3ML) 0.083% nebulizer solution 2.5 mg, 2.5 mg, Nebulization, Q6H PRN, Tamala Julian, Rondell A, MD   amiodarone (PACERONE) tablet 200 mg, 200 mg, Oral, BID, Charolette Forward, MD, 200 mg at 11/21/20 1114   atorvastatin (LIPITOR)  tablet 80 mg, 80 mg, Oral, Daily, Smith, Rondell A, MD, 80 mg at 11/21/20 1114   bisacodyl (DULCOLAX) EC tablet 20 mg, 20 mg, Oral, Once, Gribbin, Sarah J, PA-C   carvedilol (COREG) tablet 25 mg, 25 mg, Oral, BID WC, Smith, Rondell A, MD, 25 mg at 11/20/20 1808   colchicine tablet 0.6 mg, 0.6 mg, Oral, Daily, Harwani, Mohan, MD, 0.6 mg at 11/21/20 1114   diltiazem (CARDIZEM) tablet 60 mg, 60 mg, Oral, Q6H, Charlynne Cousins, MD, 60 mg at 11/21/20 1114   Gerhardt's butt cream, , Topical, PRN, Charlynne Cousins, MD   insulin aspart (novoLOG) injection 0-9 Units, 0-9 Units, Subcutaneous, Q4H, Charlynne Cousins, MD, 3 Units at 11/21/20 1213   LORazepam (ATIVAN) injection 0.5 mg, 0.5 mg, Intravenous, Q6H PRN, Howerter, Justin B, DO, 0.5 mg at 11/16/20 0228   oxybutynin (DITROPAN) tablet 5 mg, 5 mg, Oral, BID, Smith, Rondell A, MD, 5 mg at 11/21/20 1114   pantoprazole (PROTONIX) EC tablet 40 mg, 40 mg, Oral, BID, Charlynne Cousins, MD, 40 mg at 11/21/20 1114   sodium chloride flush (NS) 0.9 % injection 3 mL, 3 mL, Intravenous, Q12H, Smith, Rondell A, MD, 3 mL at 11/21/20 1115  Patients Current Diet:  Diet Order             Diet - low sodium heart healthy           Diet Heart Room service appropriate? Yes; Fluid consistency: Thin  Diet effective now                   Precautions / Restrictions Precautions Precautions: Fall, Other (comment) Precaution Comments: H/o CVA with residual L hemiparesis; bowel incontinence Restrictions Weight Bearing Restrictions: No   Has the patient had 2 or more falls or a fall with injury in the past year? No  Prior Activity Level Limited Community (1-2x/wk): Went out for appointments  Prior Functional Level Self Care: Did the patient need help bathing, dressing, using the toilet or eating? Needed some help  Indoor Mobility: Did the patient need assistance with walking from room to room (with or without device)? Needed some help  Stairs:  Did the patient need assistance with internal or external stairs (with or without device)? Needed some help  Functional Cognition: Did the patient need help planning regular tasks such as shopping or remembering to take medications? Dependent  Patient Information Are you of Hispanic, Latino/a,or Spanish origin?: A. No, not of Hispanic, Latino/a, or Spanish origin What is your race?: B. Black or African American Do you need or want an interpreter to communicate with a doctor or health care staff?: 0. No  Patient's Response To:  Health Literacy and Transportation Is the patient able to respond to health literacy and transportation needs?: Yes Health Literacy - How often do you need to have someone help you when you read instructions, pamphlets, or other written material from your doctor or pharmacy?: Never In the  past 12 months, has lack of transportation kept you from medical appointments or from getting medications?: No In the past 12 months, has lack of transportation kept you from meetings, work, or from getting things needed for daily living?: No  Development worker, international aid / [place] Devices/Equipment: Wellsite geologist, Built-in shower seat, Bedside commode/3-in-1, Wheelchair Home Equipment: Cane - quad, BSC/3in1, Wheelchair - manual  Prior Device Use: Indicate devices/aids used by the patient prior to current illness, exacerbation or injury? Manual wheelchair  Current Functional Level Cognition  Overall Cognitive Status: Within Functional Limits for tasks assessed Orientation Level: Oriented X4 General Comments: WFL for simple tasks, not formally assessed, potentially some slowed processing (may be more related to delayed speech); appears to have good insight into need for post-acute rehab before return home    Extremity Assessment (includes Sensation/Coordination)  Upper Extremity Assessment: LUE deficits/detail LUE Deficits / Details: Hemi with tendency for flexion of  hand, wrist, and elbow. subluxation of one finger width; no reported pain. ABle to perform AROM to lift arm off lap and place on pillow Pt reporting he wears a resting hand splint in the mornings. Noting his digits tight in flexion. Able to PROM into extension with increased time and effort. LUE: Subluxation noted LUE Coordination: decreased fine motor, decreased gross motor  Lower Extremity Assessment: Defer to PT evaluation LLE Deficits / Details: (P) H/o L hemiparesis; <3/5 hip and knee strength LLE Coordination: (P) decreased fine motor, decreased gross motor    ADLs  Overall ADL's : Needs assistance/impaired Eating/Feeding: Minimal assistance, Sitting Eating/Feeding Details (indicate cue type and reason): Min A for bilateral tasks Grooming: Minimal assistance, Sitting Upper Body Bathing: Moderate assistance, Sitting Lower Body Bathing: Maximal assistance, Sit to/from stand Upper Body Dressing : Moderate assistance, Sitting Lower Body Dressing: Maximal assistance, Sit to/from stand Toilet Transfer: Total assistance Toilet Transfer Details (indicate cue type and reason): Transfer to recliner with Maxi-move. Then performing sit<>stand with stedy and Mod A +2 Functional mobility during ADLs: Moderate assistance, +2 for physical assistance (sit<>stand) General ADL Comments: Pt performing transfer to reclienr wiht maxi move and then sit<>stand with stedy. Pt very motivated. demosntrating decrased balance, strength, and activity tolerance compard to baseline function    Mobility  Overal bed mobility: Needs Assistance Bed Mobility: Supine to Sit Rolling: Min assist, Mod assist, +2 for safety/equipment, +2 for physical assistance Supine to sit: Mod assist, HOB elevated General bed mobility comments: ModA for trunk elevation and scooting hips sitting on R-side bed, cued for use of bed rail with RUE, improved ability to scoot hips to EOB    Transfers  Overall transfer level: Needs  assistance Equipment used: Ambulation equipment used Transfers: Sit to/from Stand, Bed to chair/wheelchair/BSC Sit to Stand: Max assist, Mod assist, +2 safety/equipment Bed to/from chair/wheelchair/BSC transfer type:: Via Public house manager via Lift Equipment: Stedy General transfer comment: Pt requiring maxA for trunk elevation standing from low bed height into stedy standing frame, heavy reliance on RUE support to pull into standing; multiple sit<>stands from elevated Stedy seat with consistent modA for trunk elevation; modA for eccentric lowering into low recliner height; frequent verbal/tactile cues to achieve and maintain full trunk extension    Ambulation / Gait / Stairs / Office manager / Balance Dynamic Sitting Balance Sitting balance - Comments: Improved ability to achieve and maintain midline posture and static sitting balance at edge of bed and edge of recliner Balance Overall balance assessment: Needs assistance Sitting-balance support:  No upper extremity supported, Feet supported Sitting balance-Leahy Scale: Fair Sitting balance - Comments: Improved ability to achieve and maintain midline posture and static sitting balance at edge of bed and edge of recliner Standing balance support: Single extremity supported, During functional activity, Reliant on assistive device for balance Standing balance-Leahy Scale: Poor Standing balance comment: Reliant on use of stedy, RUE support and external assist    Special needs/care consideration Skin n/a, Diabetic management Novolog 1-9 units sQ every 4 hrs, and Special service needs manual wheelchair user at baseline    Previous Home Environment (from acute therapy documentation) Living Arrangements: Spouse/significant other, Children Available Help at Discharge: Family, Available 24 hours/day Type of Home: House Home Layout: One level Home Access: Stairs to enter Entrance Stairs-Rails: None Entrance Stairs-Number  of Steps: 1 Bathroom Shower/Tub: Chiropodist: Standard Home Care Services: Yes Type of Home Care Services: Home RN, Home PT Additional Comments: Lives with wife and 65 y.o. daughter  Discharge Living Setting Plans for Discharge Living Setting: Patient's home Type of Home at Discharge: House Discharge Home Layout: One level Discharge Home Access: Stairs to enter Entrance Stairs-Rails: None Entrance Stairs-Number of Steps: 1 Discharge Bathroom Shower/Tub: Tub/shower unit Discharge Bathroom Toilet: Standard Discharge Bathroom Accessibility: Yes How Accessible: Accessible via wheelchair, Accessible via walker Does the patient have any problems obtaining your medications?: No  Social/Family/Support Systems Patient Roles: Spouse Contact Information: [phone] Anticipated Caregiver: [name] Anticipated Caregiver's Contact Information: [phone] Ability/Limitations of Caregiver: Can provide min A Caregiver Availability: 24/7 Discharge Plan Discussed with Primary Caregiver: Yes Is Caregiver In Agreement with Plan?: Yes  Goals Patient/Family Goal for Rehab: PT/OT Min A wheelchair level Expected length of stay: 12-14 days Pt/Family Agrees to Admission and willing to participate: Yes Program Orientation Provided & Reviewed with Pt/Caregiver Including Roles  & Responsibilities: Yes  Decrease burden of Care through IP rehab admission: Specialzed equipment needs, Decrease number of caregivers, Bowel and bladder program, and Patient/family education  Possible need for SNF placement upon discharge: not anticipated   Patient Condition: I have reviewed medical records from [facility], spoken with CM, and patient and spouse. I met with patient at the bedside for inpatient rehabilitation assessment.  Patient will benefit from ongoing PT and OT, can actively participate in 3 hours of therapy a day 5 days of the week, and can make measurable gains during  the admission.  Patient will also benefit from the coordinated team approach during an Inpatient Acute Rehabilitation admission.  The patient will receive intensive therapy as well as Rehabilitation physician, nursing, social worker, and care management interventions.  Due to safety, skin/wound care, disease management, medication administration, pain management, and patient education the patient requires 24 hour a day rehabilitation nursing.  The patient is currently max A with mobility and basic ADLs.  Discharge setting and therapy post discharge at home with home health is anticipated.  Patient has agreed to participate in the Acute Inpatient Rehabilitation Program and will admit today.  Preadmission Screen Completed By:  Genella Mech, 11/21/2020 3:59 PM ______________________________________________________________________   Discussed status with Dr. Naaman Plummer on 11/23/20 at 1000 and received approval for admission today.  Admission Coordinator:  Genella Mech, CCC-SLP, time 1000/Date 11/23/20   Assessment/Plan: Diagnosis: debility Does the need for close, 24 hr/day Medical supervision in concert with the patient's rehab needs make it unreasonable for this patient to be served in a less intensive setting? Yes Co-Morbidities requiring supervision/potential complications: hx of CVA with residual L HP,  CHF, dm2, seizure disorder Due to bladder management, bowel management, safety, skin/wound care, disease management, medication administration, pain management, and patient education, does the patient require 24 hr/day rehab nursing? Yes Does the patient require coordinated care of a physician, rehab nurse, PT, OT, and SLP to address physical and functional deficits in the context of the above medical diagnosis(es)? Yes Addressing deficits in the following areas: balance, endurance, locomotion, strength, transferring, bowel/bladder control, bathing, dressing, feeding, grooming, toileting, and  psychosocial support Can the patient actively participate in an intensive therapy program of at least 3 hrs of therapy 5 days a week? Yes The potential for patient to make measurable gains while on inpatient rehab is excellent Anticipated functional outcomes upon discharge from inpatient rehab: min assist PT, min assist OT, n/a SLP at w/c level Estimated rehab length of stay to reach the above functional goals is: 12-14 days Anticipated discharge destination: Home 10. Overall Rehab/Functional Prognosis: excellent   MD Signature: Meredith Staggers, MD, [place] [facility] 11/23/2020

## 2020-11-21 NOTE — Progress Notes (Signed)
Inpatient Rehab Admissions Coordinator:   I met with pt. At bedside to discuss CIR admission. Pt. Is interested, states family can provide 24/7 support after d/c. I opened a case with pt.'s insurance and await authorization. I will reach out to Pt.'s family to confirm support and pursue for potential admission pending insurance auth, bed availability, and medical readiness.  Clemens Catholic, [place], [place] Admissions Coordinator  [phone] ([place]) [phone] (office)

## 2020-11-21 NOTE — Telephone Encounter (Signed)
Wife Kim Lauver dropped off FMLA forms to be filled out for her job. Per Mrs. Wierzbicki her job requested to have the form back by 11/29. She is aware of the time frame for paperwork to be filled out. She can be reached at [phone]

## 2020-11-21 NOTE — Progress Notes (Signed)
Physical Therapy Treatment Patient Details Name: [name] MRN: [id number] DOB: Oct 15, 1965 Today's Date: 11/21/2020   History of Present Illness Pt is a 55 y.o. male admitted 11/14/20 with fall from w/c with c/o L-side pain, no LOC; pt found to have afib with RVR, anemia. Imaging negative for acute injury. S/p colonoscopy and EGD 11/6 which showed polyp and esophageal ulcer. PMH includes CVA (uses w/c at baseline), HTN, DM2, diastolic dysfunction, gout, obesity.   PT Comments    Pt progressing with mobility. Today's session focused on strengthening and transfer training with Stedy standing frame. Pt remains limited by generalized weakness, decreased activity tolerance, and poor balance strategies/postural reactions. Pt's wife present and supportive, able to confirm pt's mod indep PLOF and in agreement with recommendation for post-acute rehab. Pt remains an excellent candidate for intensive CIR-level therapies to maximize functional mobility and independence prior to return home.    Recommendations for follow up therapy are one component of a multi-disciplinary discharge planning process, led by the attending physician.  Recommendations may be updated based on patient status, additional functional criteria and insurance authorization.  Follow Up Recommendations  Acute inpatient rehab (3hours/day)     Assistance Recommended at Discharge Frequent or constant Supervision/Assistance  Equipment Recommendations   (TBD; if home, will need lift equipment)    Recommendations for Other Services       Precautions / Restrictions Precautions Precautions: Fall;Other (comment) Precaution Comments: H/o CVA with residual L hemiparesis; bowel incontinence Restrictions Weight Bearing Restrictions: No     Mobility  Bed Mobility Overal bed mobility: Needs Assistance Bed Mobility: Supine to Sit     Supine to sit: Mod assist;HOB elevated     General bed mobility comments: ModA for trunk  elevation and scooting hips sitting on R-side bed, cued for use of bed rail with RUE, improved ability to scoot hips to EOB    Transfers Overall transfer level: Needs assistance Equipment used: Ambulation equipment used Transfers: Sit to/from Stand;Bed to chair/wheelchair/BSC Sit to Stand: Max assist;Mod assist;+2 safety/equipment           General transfer comment: Pt requiring maxA for trunk elevation standing from low bed height into stedy standing frame, heavy reliance on RUE support to pull into standing; multiple sit<>stands from elevated Stedy seat with consistent modA for trunk elevation; modA for eccentric lowering into low recliner height; frequent verbal/tactile cues to achieve and maintain full trunk extension Transfer via Lift Equipment: Stedy  Ambulation/Gait                   Stairs             Wheelchair Mobility    Modified Rankin (Stroke Patients Only)       Balance Overall balance assessment: Needs assistance Sitting-balance support: No upper extremity supported;Feet supported Sitting balance-Leahy Scale: Fair Sitting balance - Comments: Improved ability to achieve and maintain midline posture and static sitting balance at edge of bed and edge of recliner   Standing balance support: Single extremity supported;During functional activity;Reliant on assistive device for balance Standing balance-Leahy Scale: Poor Standing balance comment: Reliant on use of stedy, RUE support and external assist                            Cognition Arousal/Alertness: Awake/alert Behavior During Therapy: WFL for tasks assessed/performed;Flat affect Overall Cognitive Status: Within Functional Limits for tasks assessed  General Comments: WFL for simple tasks, not formally assessed, potentially some slowed processing (may be more related to delayed speech); appears to have good insight into need for post-acute  rehab before return home        Exercises General Exercises - Lower Extremity Long Arc Quad: AROM;Both;Seated Other Exercises Other Exercises: Seated bilateral hamstring stretch (stretch/assist from PT), noted hamstring tightness - wife endorses pt primarily sitting majority of day    General Comments General comments (skin integrity, edema, etc.): VSS on RA. Pt's wife present and supportive; increased time discussing d/c plan, wife requesting SNF if CIR does not approve, pt in agreement by end of session. Wife agrees to bring in resting hand splint next session      Pertinent Vitals/Pain Pain Assessment: No/denies pain Pain Intervention(s): Monitored during session    Home Living                          Prior Function            PT Goals (current goals can now be found in the care plan section) Progress towards PT goals: Progressing toward goals    Frequency    Min 3X/week      PT Plan Current plan remains appropriate    Co-evaluation              AM-PAC PT "6 Clicks" Mobility   Outcome Measure  Help needed turning from your back to your side while in a flat bed without using bedrails?: A Lot Help needed moving from lying on your back to sitting on the side of a flat bed without using bedrails?: A Lot Help needed moving to and from a bed to a chair (including a wheelchair)?: Total Help needed standing up from a chair using your arms (e.g., wheelchair or bedside chair)?: Total Help needed to walk in hospital room?: Total Help needed climbing 3-5 steps with a railing? : Total 6 Click Score: 8    End of Session Equipment Utilized During Treatment: Gait belt Activity Tolerance: Patient tolerated treatment well Patient left: in chair;with call bell/phone within reach;with family/visitor present Nurse Communication: Mobility status;Need for lift equipment PT Visit Diagnosis: Other abnormalities of gait and mobility (R26.89);Muscle weakness  (generalized) (M62.81);Other symptoms and signs involving the nervous system (R29.898)     Time: 2800-3491 PT Time Calculation (min) (ACUTE ONLY): 31 min  Charges:  $Therapeutic Activity: 23-37 mins                     Mabeline Caras, PT, DPT Acute Rehabilitation Services  Pager [phone] Office [place] 11/21/2020, 12:18 PM

## 2020-11-21 NOTE — Progress Notes (Signed)
[facility]  PROGRESS NOTE  [name] PJA:[id number] DOB: [birth year] DOA: 11/14/2020 PCP: Ladell Pier, MD   Brief HPI:   55 year old male with history of hypertension, chronic diastolic CHF, CVA with left residual weakness, diabetes mellitus type 2, seizure disorder, history of rectal cancer s/p resection with radiation and chemotherapy came in complaining of left-sided pain.  At baseline he gets around with a wheelchair, before he came to ED was trying to transition from wheelchair to bed and he missed and fell.  Did not lose consciousness.  He was brought to ED.  He was found to be in A. fib with RVR with heart rate in 140s.  CT scan of the chest/abdomen/pelvis was negative for acute abnormality.  He was started on Cardizem drip.    Subjective   Patient seen and examined, awaiting to go to CIR.   Assessment/Plan:    New onset A. fib with RVR -CHA2DS2-VASc score 4 -Patient on Coreg, Cardizem, amiodarone -Patient in sinus rhythm -Cardiology was consulted, 2D echo showed EF 60% -GI to decide when to start DOAC; likely after EGD in 8 weeks  Microcytic anemia -GI was consulted for endoscopic procedure -Had to be delayed as patient went into A. fib with RVR -Endoscopically showed esophageal ulcer, GI recommend to continue with PPI and repeat EGD in 8 weeks -Biopsies were taken -Colonoscopy showed cecal polyp   Acute kidney injury -Baseline creatinine 1.2, presented with creatinine of 2.9 -In setting of NSAID and ACE inhibitor use -Resolved with fluid resuscitation -Back to baseline  Diabetes mellitus type 2 -Hemoglobin A1c 6.7 -Continue sliding scale insulin with NovoLog  Left flank pain -Secondary to fall -CT chest/abdomen/pelvis showed no acute findings  Hypertension -Blood pressure stable  History chronic diastolic dysfunction -Euvolemic -ACE inhibitor on hold in setting of acute kidney injury  History of meningioma -Stable on CT  head  History of CVA -Continue Lipitor -Follow-up neurology as outpatient and probably resume low-dose aspirin in 2 to 4 weeks     Scheduled medications:    amiodarone  200 mg Oral BID   atorvastatin  80 mg Oral Daily   bisacodyl  20 mg Oral Once   carvedilol  25 mg Oral BID WC   colchicine  0.6 mg Oral Daily   diltiazem  60 mg Oral Q6H   insulin aspart  0-9 Units Subcutaneous Q4H   oxybutynin  5 mg Oral BID   pantoprazole  40 mg Oral BID   sodium chloride flush  3 mL Intravenous Q12H     Data Reviewed:   CBG:  Recent Labs  Lab 11/20/20 2004 11/20/20 2334 11/21/20 0316 11/21/20 0750 11/21/20 1133  GLUCAP 194* 107* 166* 143* 213*    SpO2: 96 % O2 Flow Rate (L/min): 0 L/min FiO2 (%): 21 %    Vitals:   11/21/20 0512 11/21/20 0604 11/21/20 0744 11/21/20 1128  BP:  103/78 123/79 90/66  Pulse: 77  84 70  Resp: 18  (!) 22 19  Temp:   98.2 F (36.8 C) 98.4 F (36.9 C)  TempSrc:   Oral Oral  SpO2: 95%  92% 96%  Weight:      Height:         Intake/Output Summary (Last 24 hours) at 11/21/2020 1450 Last data filed at 11/21/2020 1146 Gross per 24 hour  Intake 480 ml  Output 901 ml  Net -421 ml    11/07 1901 - 11/09 0700 In: 960 [P.O.:960] Out: 901 [Urine:900]  Filed Weights  11/19/20 0358 11/20/20 0552 11/21/20 0316  Weight: 84.4 kg 83 kg 80.3 kg    Data Reviewed: Basic Metabolic Panel: Recent Labs  Lab 11/16/20 0111 11/17/20 0052 11/18/20 0044 11/19/20 0225 11/20/20 0103 11/21/20 0107  NA 138  --  140 142 139 135  K 3.7  --  3.5 3.8 4.2 4.0  CL 106  --  110 109 107 104  CO2 22  --  22 23 21* 17*  GLUCOSE 159*  --  138* 132* 140* 112*  BUN 13  --  18 17 21* 21*  CREATININE 1.19  --  1.34* 1.37* 1.27* 1.20  CALCIUM 8.6*  --  8.3* 8.9 8.8* 8.5*  MG 1.7 2.4  --   --   --   --    Liver Function Tests: No results for input(s): AST, ALT, ALKPHOS, BILITOT, PROT, ALBUMIN in the last 168 hours. No results for input(s): LIPASE, AMYLASE in the  last 168 hours. No results for input(s): AMMONIA in the last 168 hours. CBC: Recent Labs  Lab 11/15/20 0111 11/16/20 0111 11/17/20 0052 11/18/20 0044 11/19/20 0225  WBC 6.5 9.2 10.2 7.5 7.0  HGB 9.5* 10.8* 10.1* 10.3* 10.6*  HCT 29.0* 32.2* 30.1* 30.9* 31.8*  MCV 79.5* 77.8* 76.6* 78.0* 77.6*  PLT 227 256 261 237 304   Cardiac Enzymes: Recent Labs  Lab 11/15/20 0111  CKTOTAL 348   BNP (last 3 results) No results for input(s): BNP in the last 8760 hours.  ProBNP (last 3 results) No results for input(s): PROBNP in the last 8760 hours.  CBG: Recent Labs  Lab 11/20/20 2004 11/20/20 2334 11/21/20 0316 11/21/20 0750 11/21/20 1133  GLUCAP 194* 107* 166* 143* 213*       Radiology Reports  No results found.     Antibiotics: Anti-infectives (From admission, onward)    None         DVT prophylaxis: SCDs  Code Status: Full code  Family Communication: No family at bedside   Consultants: Cardiology  Procedures:     Objective    Physical Examination:   General-appears in no acute distress Heart-S1-S2, regular, no murmur auscultated Lungs-clear to auscultation bilaterally, no wheezing or crackles auscultated Abdomen-soft, nontender, no organomegaly Extremities-no edema in the lower extremities Neuro-alert, oriented x3, no focal deficit noted   Status is: Inpatient  Dispo: The patient is from: Home              Anticipated d/c is to: CIR              Anticipated d/c date is: 11/22/2020              Patient currently not stable for discharge  Barrier to discharge-awaiting bed at [facility]  No results for input(s): DDIMER, FERRITIN, LDH, CRP in the last 72 hours.  Lab Results  Component Value Date   SARSCOV2NAA NEGATIVE 11/14/2020   SARSCOV2NAA POSITIVE (A) 06/21/2020   [place] Not Detected 12/04/2018            Recent Results (from the past 240 hour(s))  Resp Panel by RT-PCR (Flu A&B, Covid) Nasopharyngeal  Swab     Status: None   Collection Time: 11/14/20  4:02 AM   Specimen: Nasopharyngeal Swab; Nasopharyngeal(NP) swabs in vial transport medium  Result Value Ref Range Status   SARS Coronavirus 2 by RT PCR NEGATIVE NEGATIVE Final    Comment: (NOTE) SARS-CoV-2 target nucleic acids are NOT DETECTED.  The SARS-CoV-2 RNA is generally detectable  in upper respiratory specimens during the acute phase of infection. The lowest concentration of SARS-CoV-2 viral copies this assay can detect is 138 copies/mL. A negative result does not preclude SARS-Cov-2 infection and should not be used as the sole basis for treatment or other patient management decisions. A negative result may occur with  improper specimen collection/handling, submission of specimen other than nasopharyngeal swab, presence of viral mutation(s) within the areas targeted by this assay, and inadequate number of viral copies(<138 copies/mL). A negative result must be combined with clinical observations, patient history, and epidemiological information. The expected result is Negative.  Fact Sheet for Patients:  EntrepreneurPulse.com.au  Fact Sheet for Healthcare Providers:  IncredibleEmployment.be  This test is no t yet approved or cleared by the [place] FDA and  has been authorized for detection and/or diagnosis of SARS-CoV-2 by FDA under an Emergency Use Authorization (EUA). This EUA will remain  in effect (meaning this test can be used) for the duration of the COVID-19 declaration under Section 564(b)(1) of the Act, 21 U.S.C.section 360bbb-3(b)(1), unless the authorization is terminated  or revoked sooner.       Influenza A by PCR NEGATIVE NEGATIVE Final   Influenza B by PCR NEGATIVE NEGATIVE Final    Comment: (NOTE) The Xpert Xpress SARS-CoV-2/FLU/RSV plus assay is intended as an aid in the diagnosis of influenza from Nasopharyngeal swab specimens and should not be used as a sole  basis for treatment. Nasal washings and aspirates are unacceptable for Xpert Xpress SARS-CoV-2/FLU/RSV testing.  Fact Sheet for Patients: EntrepreneurPulse.com.au  Fact Sheet for Healthcare Providers: IncredibleEmployment.be  This test is not yet approved or cleared by the [place] FDA and has been authorized for detection and/or diagnosis of SARS-CoV-2 by FDA under an Emergency Use Authorization (EUA). This EUA will remain in effect (meaning this test can be used) for the duration of the COVID-19 declaration under Section 564(b)(1) of the Act, 21 U.S.C. section 360bbb-3(b)(1), unless the authorization is terminated or revoked.  Performed at [facility], [address], [place], [place] [address]   MRSA Next Gen by PCR, Nasal     Status: None   Collection Time: 11/14/20  5:38 AM   Specimen: Nasal Mucosa; Nasal Swab  Result Value Ref Range Status   MRSA by PCR Next Gen NOT DETECTED NOT DETECTED Final    Comment: (NOTE) The GeneXpert MRSA Assay (FDA approved for NASAL specimens only), is one component of a comprehensive MRSA colonization surveillance program. It is not intended to diagnose MRSA infection nor to guide or monitor treatment for MRSA infections. Test performance is not FDA approved in patients less than 50 years old. Performed at [facility], [place] [address], [place], [place] [address]     [facility] If 7PM-7AM, please contact night-coverage at [email or web address], Office  [phone]   11/21/2020, 2:50 PM  LOS: 7 days

## 2020-11-21 NOTE — Consult Note (Signed)
Ref: Ladell Pier, MD   Subjective:  Awake.  Inpatient rehab suggested by PT. Monitor shows sinus rhythm.  Objective:  Vital Signs in the last 24 hours: Temp:  [98.2 F (36.8 C)-99.5 F (37.5 C)] 98.2 F (36.8 C) (11/09 0744) Pulse Rate:  [75-90] 84 (11/09 0744) Cardiac Rhythm: Normal sinus rhythm (11/09 0703) Resp:  [18-34] 22 (11/09 0744) BP: (103-154)/(72-91) 123/79 (11/09 0744) SpO2:  [92 %-96 %] 92 % (11/09 0744) Weight:  [80.3 kg] 80.3 kg (11/09 0316)  Physical Exam: BP Readings from Last 1 Encounters:  11/21/20 123/79     Wt Readings from Last 1 Encounters:  11/21/20 80.3 kg    Weight change: -2.7 kg Body mass index is 30.39 kg/m. HEENT: [place]/AT, Eyes-Brown, wears glasses, Conjunctiva-Pink, Sclera-Non-icteric Neck: No JVD, No bruit, Trachea midline. Lungs:  Clear, Bilateral. Cardiac:  Regular rhythm, normal S1 and S2, no S3. II/VI systolic murmur. Abdomen:  Soft, non-tender. BS present. Extremities:  No edema present. No cyanosis. No clubbing. CNS: AxOx3, Cranial nerves grossly intact, left sided weakness.  Skin: Warm and dry.   Intake/Output from previous day: 11/08 0701 - 11/09 0700 In: 720 [P.O.:720] Out: 901 [Urine:900; Stool:1]    Lab Results: BMET    Component Value Date/Time   NA 135 11/21/2020 0107   NA 139 11/20/2020 0103   NA 142 11/19/2020 0225   NA 145 (H) 06/07/2020 1019   NA 142 11/11/2018 1400   NA 144 11/23/2017 1706   K 4.0 11/21/2020 0107   K 4.2 11/20/2020 0103   K 3.8 11/19/2020 0225   CL 104 11/21/2020 0107   CL 107 11/20/2020 0103   CL 109 11/19/2020 0225   CO2 17 (L) 11/21/2020 0107   CO2 21 (L) 11/20/2020 0103   CO2 23 11/19/2020 0225   GLUCOSE 112 (H) 11/21/2020 0107   GLUCOSE 140 (H) 11/20/2020 0103   GLUCOSE 132 (H) 11/19/2020 0225   BUN 21 (H) 11/21/2020 0107   BUN 21 (H) 11/20/2020 0103   BUN 17 11/19/2020 0225   BUN 17 06/07/2020 1019   BUN 11 11/11/2018 1400   BUN 13 01/26/2018 1026   CREATININE 1.20  11/21/2020 0107   CREATININE 1.27 (H) 11/20/2020 0103   CREATININE 1.37 (H) 11/19/2020 0225   CALCIUM 8.5 (L) 11/21/2020 0107   CALCIUM 8.8 (L) 11/20/2020 0103   CALCIUM 8.9 11/19/2020 0225   GFRNONAA >60 11/21/2020 0107   GFRNONAA >60 11/20/2020 0103   GFRNONAA >60 11/19/2020 0225   GFRAA >60 04/21/2019 1049   GFRAA 87 11/11/2018 1400   GFRAA 85 01/26/2018 1026   CBC    Component Value Date/Time   WBC 7.0 11/19/2020 0225   RBC 4.10 (L) 11/19/2020 0225   HGB 10.6 (L) 11/19/2020 0225   HGB 10.6 (L) 10/15/2020 1025   HGB 14.2 08/02/2008 1026   HCT 31.8 (L) 11/19/2020 0225   HCT 31.8 (L) 10/15/2020 1025   HCT 41.6 08/02/2008 1026   PLT 304 11/19/2020 0225   PLT 211 10/15/2020 1025   MCV 77.6 (L) 11/19/2020 0225   MCV 78 (L) 10/15/2020 1025   MCV 78.4 (L) 08/02/2008 1026   MCH 25.9 (L) 11/19/2020 0225   MCHC 33.3 11/19/2020 0225   RDW 15.3 11/19/2020 0225   RDW 13.6 10/15/2020 1025   RDW 16.3 (H) 08/02/2008 1026   LYMPHSABS 1.3 11/14/2020 0133   LYMPHSABS 1.3 11/11/2018 1400   LYMPHSABS 0.6 (L) 08/02/2008 1026   MONOABS 0.8 11/14/2020 0133   MONOABS  0.2 08/02/2008 1026   EOSABS 0.0 11/14/2020 0133   EOSABS 0.1 11/11/2018 1400   BASOSABS 0.0 11/14/2020 0133   BASOSABS 0.0 11/11/2018 1400   BASOSABS 0.0 08/02/2008 1026   HEPATIC Function Panel Recent Labs    06/07/20 1019 11/14/20 0133  PROT 7.4 8.4*   HEMOGLOBIN A1C No components found for: HGA1C,  MPG CARDIAC ENZYMES Lab Results  Component Value Date   CKTOTAL 348 11/15/2020   BNP No results for input(s): PROBNP in the last 8760 hours. TSH Recent Labs    11/14/20 1246  TSH 1.076   CHOLESTEROL Recent Labs    06/07/20 1019  CHOL 141    Scheduled Meds:  amiodarone  200 mg Oral BID   atorvastatin  80 mg Oral Daily   bisacodyl  20 mg Oral Once   carvedilol  25 mg Oral BID WC   colchicine  0.6 mg Oral Daily   diltiazem  60 mg Oral Q6H   insulin aspart  0-9 Units Subcutaneous Q4H   oxybutynin  5  mg Oral BID   pantoprazole  40 mg Oral BID   sodium chloride flush  3 mL Intravenous Q12H   Continuous Infusions:  PRN Meds:.acetaminophen **OR** acetaminophen, acetaminophen-codeine, albuterol, Gerhardt's butt cream, LORazepam  Assessment/Plan:  Paroxysmal atrial fibrillation S/P right MCA with left sided weakness Esophageal ulcer Type 2 DM HTN Obesity Gout CKD, II  Plan: Continue medical treatment. Awaiting IP rehab. Postpone anticoagulation till esophageal ulcer is healing and has stable ambulation.   LOS: 7 days   Time spent including chart review, lab review, examination, discussion with patient/Nurse : 30 min   Dixie Dials  MD  11/21/2020, 9:02 AM [phone]

## 2020-11-21 NOTE — Plan of Care (Signed)
  Problem: Education: Goal: Knowledge of General Education information will improve Description: Including pain rating scale, medication(s)/side effects and non-pharmacologic comfort measures Outcome: Progressing   Problem: Clinical Measurements: Goal: Ability to maintain clinical measurements within normal limits will improve Outcome: Progressing Goal: Diagnostic test results will improve Outcome: Progressing Goal: Respiratory complications will improve Outcome: Progressing Goal: Cardiovascular complication will be avoided Outcome: Progressing   Problem: Activity: Goal: Risk for activity intolerance will decrease Outcome: Progressing   Problem: Nutrition: Goal: Adequate nutrition will be maintained Outcome: Progressing   

## 2020-11-22 LAB — BASIC METABOLIC PANEL
Anion gap: 8 (ref 5–15)
BUN: 20 mg/dL (ref 6–20)
CO2: 22 mmol/L (ref 22–32)
Calcium: 8.7 mg/dL — ABNORMAL LOW (ref 8.9–10.3)
Chloride: 107 mmol/L (ref 98–111)
Creatinine, Ser: 1.32 mg/dL — ABNORMAL HIGH (ref 0.61–1.24)
GFR, Estimated: >60 mL/min (ref >60)
Glucose, Bld: 133 mg/dL — ABNORMAL HIGH (ref 70–99)
Potassium: 3.9 mmol/L (ref 3.5–5.1)
Sodium: 137 mmol/L (ref 135–145)

## 2020-11-22 LAB — GLUCOSE, CAPILLARY
Glucose-Capillary: 128 mg/dL — ABNORMAL HIGH (ref 70–99)
Glucose-Capillary: 135 mg/dL — ABNORMAL HIGH (ref 70–99)
Glucose-Capillary: 136 mg/dL — ABNORMAL HIGH (ref 70–99)
Glucose-Capillary: 137 mg/dL — ABNORMAL HIGH (ref 70–99)
Glucose-Capillary: 150 mg/dL — ABNORMAL HIGH (ref 70–99)
Glucose-Capillary: 156 mg/dL — ABNORMAL HIGH (ref 70–99)

## 2020-11-22 NOTE — Consult Note (Signed)
Ref: Mark Pier, MD   Subjective:  Awake. Sinus rhythm on monitor. Mild respiratory distress.  Objective:  Vital Signs in the last 24 hours: Temp:  [98 F (36.7 C)-98.6 F (37 C)] 98 F (36.7 C) (11/10 0723) Pulse Rate:  [70-85] 85 (11/10 0723) Cardiac Rhythm: Normal sinus rhythm (11/10 0702) Resp:  [18-27] 21 (11/10 0723) BP: (90-133)/(66-86) 122/84 (11/10 0723) SpO2:  [93 %-96 %] 95 % (11/10 0723)  Physical Exam: BP Readings from Last 1 Encounters:  11/22/20 122/84     Wt Readings from Last 1 Encounters:  11/21/20 80.3 kg    Weight change:  Body mass index is 30.39 kg/m. HEENT: [place]/AT, Eyes-Brown, Conjunctiva-Pink, Sclera-Non-icteric Neck: No JVD, No bruit, Trachea midline. Lungs:  Clear, Bilateral. Cardiac:  Regular rhythm, normal S1 and S2, no S3. II/VI systolic murmur. Abdomen:  Soft, non-tender. BS present. Extremities:  No edema present. No cyanosis. No clubbing. CNS: AxOx3, Cranial nerves grossly intact, Left sided weakness.   Intake/Output from previous day: 11/09 0701 - 11/10 0700 In: 480 [P.O.:480] Out: 1250 [Urine:1250]    Lab Results: BMET    Component Value Date/Time   NA 137 11/22/2020 0258   NA 135 11/21/2020 0107   NA 139 11/20/2020 0103   NA 145 (H) 06/07/2020 1019   NA 142 11/11/2018 1400   NA 144 11/23/2017 1706   K 3.9 11/22/2020 0258   K 4.0 11/21/2020 0107   K 4.2 11/20/2020 0103   CL 107 11/22/2020 0258   CL 104 11/21/2020 0107   CL 107 11/20/2020 0103   CO2 22 11/22/2020 0258   CO2 17 (L) 11/21/2020 0107   CO2 21 (L) 11/20/2020 0103   GLUCOSE 133 (H) 11/22/2020 0258   GLUCOSE 112 (H) 11/21/2020 0107   GLUCOSE 140 (H) 11/20/2020 0103   BUN 20 11/22/2020 0258   BUN 21 (H) 11/21/2020 0107   BUN 21 (H) 11/20/2020 0103   BUN 17 06/07/2020 1019   BUN 11 11/11/2018 1400   BUN 13 01/26/2018 1026   CREATININE 1.32 (H) 11/22/2020 0258   CREATININE 1.20 11/21/2020 0107   CREATININE 1.27 (H) 11/20/2020 0103   CALCIUM 8.7  (L) 11/22/2020 0258   CALCIUM 8.5 (L) 11/21/2020 0107   CALCIUM 8.8 (L) 11/20/2020 0103   GFRNONAA >60 11/22/2020 0258   GFRNONAA >60 11/21/2020 0107   GFRNONAA >60 11/20/2020 0103   GFRAA >60 04/21/2019 1049   GFRAA 87 11/11/2018 1400   GFRAA 85 01/26/2018 1026   CBC    Component Value Date/Time   WBC 7.0 11/19/2020 0225   RBC 4.10 (L) 11/19/2020 0225   HGB 10.6 (L) 11/19/2020 0225   HGB 10.6 (L) 10/15/2020 1025   HGB 14.2 08/02/2008 1026   HCT 31.8 (L) 11/19/2020 0225   HCT 31.8 (L) 10/15/2020 1025   HCT 41.6 08/02/2008 1026   PLT 304 11/19/2020 0225   PLT 211 10/15/2020 1025   MCV 77.6 (L) 11/19/2020 0225   MCV 78 (L) 10/15/2020 1025   MCV 78.4 (L) 08/02/2008 1026   MCH 25.9 (L) 11/19/2020 0225   MCHC 33.3 11/19/2020 0225   RDW 15.3 11/19/2020 0225   RDW 13.6 10/15/2020 1025   RDW 16.3 (H) 08/02/2008 1026   LYMPHSABS 1.3 11/14/2020 0133   LYMPHSABS 1.3 11/11/2018 1400   LYMPHSABS 0.6 (L) 08/02/2008 1026   MONOABS 0.8 11/14/2020 0133   MONOABS 0.2 08/02/2008 1026   EOSABS 0.0 11/14/2020 0133   EOSABS 0.1 11/11/2018 1400   BASOSABS 0.0  11/14/2020 0133   BASOSABS 0.0 11/11/2018 1400   BASOSABS 0.0 08/02/2008 1026   HEPATIC Function Panel Recent Labs    06/07/20 1019 11/14/20 0133  PROT 7.4 8.4*   HEMOGLOBIN A1C No components found for: HGA1C,  MPG CARDIAC ENZYMES Lab Results  Component Value Date   CKTOTAL 348 11/15/2020   BNP No results for input(s): PROBNP in the last 8760 hours. TSH Recent Labs    11/14/20 1246  TSH 1.076   CHOLESTEROL Recent Labs    06/07/20 1019  CHOL 141    Scheduled Meds:  amiodarone  200 mg Oral BID   atorvastatin  80 mg Oral Daily   bisacodyl  20 mg Oral Once   carvedilol  25 mg Oral BID WC   colchicine  0.6 mg Oral Daily   diltiazem  60 mg Oral Q6H   insulin aspart  0-9 Units Subcutaneous Q4H   oxybutynin  5 mg Oral BID   pantoprazole  40 mg Oral BID   sodium chloride flush  3 mL Intravenous Q12H    Continuous Infusions:  PRN Meds:.acetaminophen **OR** acetaminophen, acetaminophen-codeine, albuterol, Gerhardt's butt cream, LORazepam  Assessment/Plan:  Paroxysmal atrial fibrillation S/P right MCA stroke with left sided weakness Esophageal ulcer Type 2 DM HTN Obesity Gout CKD, II  Plan: Increase activity. Follow up in 1 month and post repeat EGD. Awaiting rehab or SNF.   LOS: 8 days   Time spent including chart review, lab review, examination, discussion with patient/Nurse/Doctor : 30 min   Dixie Dials  MD  11/22/2020, 10:10 AM

## 2020-11-22 NOTE — Progress Notes (Signed)
[facility]  PROGRESS NOTE  [name]:[id number] DOB: [birth year] DOA: 11/14/2020 PCP: Ladell Pier, MD   Brief HPI:   55 year old male with history of hypertension, chronic diastolic CHF, CVA with left residual weakness, diabetes mellitus type 2, seizure disorder, history of rectal cancer s/p resection with radiation and chemotherapy came in complaining of left-sided pain.  At baseline he gets around with a wheelchair, before he came to ED was trying to transition from wheelchair to bed and he missed and fell.  Did not lose consciousness.  He was brought to ED.  He was found to be in A. fib with RVR with heart rate in 140s.  CT scan of the chest/abdomen/pelvis was negative for acute abnormality.  He was started on Cardizem drip.    Subjective   Patient seen and examined, no new complaints.  Awaiting to go to CIR.   Assessment/Plan:    New onset A. fib with RVR -CHA2DS2-VASc score 4 -Patient on Coreg, Cardizem, amiodarone -Patient in sinus rhythm -Cardiology was consulted, 2D echo showed EF 60% -GI to decide when to start DOAC; likely after EGD in 8 weeks  Microcytic anemia -GI was consulted for endoscopic procedure -Had to be delayed as patient went into A. fib with RVR -Endoscopically showed esophageal ulcer, GI recommend to continue with PPI and repeat EGD in 8 weeks -Biopsies were taken -Colonoscopy showed cecal polyp   Acute kidney injury -Baseline creatinine 1.2, presented with creatinine of 2.9 -In setting of NSAID and ACE inhibitor use -Resolved with fluid resuscitation -Back to baseline  Diabetes mellitus type 2 -Hemoglobin A1c 6.7 -Continue sliding scale insulin with NovoLog  Left flank pain -Secondary to fall -CT chest/abdomen/pelvis showed no acute findings  Hypertension -Blood pressure stable  History chronic diastolic dysfunction -Euvolemic -ACE inhibitor on hold in setting of acute kidney injury  History of meningioma -Stable  on CT head  History of CVA -Continue Lipitor -Follow-up neurology as outpatient and probably resume low-dose aspirin in 2 to 4 weeks     Scheduled medications:    amiodarone  200 mg Oral BID   atorvastatin  80 mg Oral Daily   bisacodyl  20 mg Oral Once   carvedilol  25 mg Oral BID WC   colchicine  0.6 mg Oral Daily   diltiazem  60 mg Oral Q6H   insulin aspart  0-9 Units Subcutaneous Q4H   oxybutynin  5 mg Oral BID   pantoprazole  40 mg Oral BID   sodium chloride flush  3 mL Intravenous Q12H     Data Reviewed:   CBG:  Recent Labs  Lab 11/21/20 1625 11/21/20 1945 11/22/20 0012 11/22/20 0415 11/22/20 1143  GLUCAP 141* 144* 135* 136* 137*    SpO2: 97 % O2 Flow Rate (L/min): 0 L/min FiO2 (%): 21 %    Vitals:   11/22/20 0014 11/22/20 0358 11/22/20 0723 11/22/20 1126  BP: 115/77 133/86 122/84 (!) 135/91  Pulse: 80 84 85 80  Resp: 20 20 (!) 21 20  Temp: 98.1 F (36.7 C) 98.1 F (36.7 C) 98 F (36.7 C) 98 F (36.7 C)  TempSrc: Oral Oral Oral Oral  SpO2: 93% 96% 95% 97%  Weight:      Height:         Intake/Output Summary (Last 24 hours) at 11/22/2020 1343 Last data filed at 11/22/2020 1300 Gross per 24 hour  Intake 600 ml  Output 1250 ml  Net -650 ml    11/08 1901 -  11/10 0700 In: 720 [P.O.:720] Out: 2151 [Urine:2150]  Filed Weights   11/19/20 0358 11/20/20 0552 11/21/20 0316  Weight: 84.4 kg 83 kg 80.3 kg    Data Reviewed: Basic Metabolic Panel: Recent Labs  Lab 11/16/20 0111 11/17/20 0052 11/18/20 0044 11/19/20 0225 11/20/20 0103 11/21/20 0107 11/22/20 0258  NA 138  --  140 142 139 135 137  K 3.7  --  3.5 3.8 4.2 4.0 3.9  CL 106  --  110 109 107 104 107  CO2 22  --  22 23 21* 17* 22  GLUCOSE 159*  --  138* 132* 140* 112* 133*  BUN 13  --  18 17 21* 21* 20  CREATININE 1.19  --  1.34* 1.37* 1.27* 1.20 1.32*  CALCIUM 8.6*  --  8.3* 8.9 8.8* 8.5* 8.7*  MG 1.7 2.4  --   --   --   --   --    Liver Function Tests: No results for  input(s): AST, ALT, ALKPHOS, BILITOT, PROT, ALBUMIN in the last 168 hours. No results for input(s): LIPASE, AMYLASE in the last 168 hours. No results for input(s): AMMONIA in the last 168 hours. CBC: Recent Labs  Lab 11/16/20 0111 11/17/20 0052 11/18/20 0044 11/19/20 0225  WBC 9.2 10.2 7.5 7.0  HGB 10.8* 10.1* 10.3* 10.6*  HCT 32.2* 30.1* 30.9* 31.8*  MCV 77.8* 76.6* 78.0* 77.6*  PLT 256 261 237 304   Cardiac Enzymes: No results for input(s): CKTOTAL, CKMB, CKMBINDEX, TROPONINI in the last 168 hours.  BNP (last 3 results) No results for input(s): BNP in the last 8760 hours.  ProBNP (last 3 results) No results for input(s): PROBNP in the last 8760 hours.  CBG: Recent Labs  Lab 11/21/20 1625 11/21/20 1945 11/22/20 0012 11/22/20 0415 11/22/20 1143  GLUCAP 141* 144* 135* 136* 137*       Radiology Reports  No results found.     Antibiotics: Anti-infectives (From admission, onward)    None         DVT prophylaxis: SCDs  Code Status: Full code  Family Communication: No family at bedside   Consultants: Cardiology  Procedures:     Objective    Physical Examination:  General-appears in no acute distress Heart-S1-S2, regular, no murmur auscultated Lungs-clear to auscultation bilaterally, no wheezing or crackles auscultated Abdomen-soft, nontender, no organomegaly Extremities-no edema in the lower extremities Neuro-alert, oriented x3, no focal deficit noted   Status is: Inpatient  Dispo: The patient is from: Home              Anticipated d/c is to: CIR              Anticipated d/c date is: 11/25/2020              Patient currently not stable for discharge  Barrier to discharge-awaiting bed at [facility]  No results for input(s): DDIMER, FERRITIN, LDH, CRP in the last 72 hours.  Lab Results  Component Value Date   SARSCOV2NAA NEGATIVE 11/14/2020   SARSCOV2NAA POSITIVE (A) 06/21/2020   [place] Not Detected 12/04/2018             Recent Results (from the past 240 hour(s))  Resp Panel by RT-PCR (Flu A&B, Covid) Nasopharyngeal Swab     Status: None   Collection Time: 11/14/20  4:02 AM   Specimen: Nasopharyngeal Swab; Nasopharyngeal(NP) swabs in vial transport medium  Result Value Ref Range Status   SARS Coronavirus 2 by RT PCR NEGATIVE  NEGATIVE Final    Comment: (NOTE) SARS-CoV-2 target nucleic acids are NOT DETECTED.  The SARS-CoV-2 RNA is generally detectable in upper respiratory specimens during the acute phase of infection. The lowest concentration of SARS-CoV-2 viral copies this assay can detect is 138 copies/mL. A negative result does not preclude SARS-Cov-2 infection and should not be used as the sole basis for treatment or other patient management decisions. A negative result may occur with  improper specimen collection/handling, submission of specimen other than nasopharyngeal swab, presence of viral mutation(s) within the areas targeted by this assay, and inadequate number of viral copies(<138 copies/mL). A negative result must be combined with clinical observations, patient history, and epidemiological information. The expected result is Negative.  Fact Sheet for Patients:  EntrepreneurPulse.com.au  Fact Sheet for Healthcare Providers:  IncredibleEmployment.be  This test is no t yet approved or cleared by the [place] FDA and  has been authorized for detection and/or diagnosis of SARS-CoV-2 by FDA under an Emergency Use Authorization (EUA). This EUA will remain  in effect (meaning this test can be used) for the duration of the COVID-19 declaration under Section 564(b)(1) of the Act, 21 U.S.C.section 360bbb-3(b)(1), unless the authorization is terminated  or revoked sooner.       Influenza A by PCR NEGATIVE NEGATIVE Final   Influenza B by PCR NEGATIVE NEGATIVE Final    Comment: (NOTE) The Xpert Xpress SARS-CoV-2/FLU/RSV plus assay is  intended as an aid in the diagnosis of influenza from Nasopharyngeal swab specimens and should not be used as a sole basis for treatment. Nasal washings and aspirates are unacceptable for Xpert Xpress SARS-CoV-2/FLU/RSV testing.  Fact Sheet for Patients: EntrepreneurPulse.com.au  Fact Sheet for Healthcare Providers: IncredibleEmployment.be  This test is not yet approved or cleared by the [place] FDA and has been authorized for detection and/or diagnosis of SARS-CoV-2 by FDA under an Emergency Use Authorization (EUA). This EUA will remain in effect (meaning this test can be used) for the duration of the COVID-19 declaration under Section 564(b)(1) of the Act, 21 U.S.C. section 360bbb-3(b)(1), unless the authorization is terminated or revoked.  Performed at [facility], [address], [place], [place] [address]   MRSA Next Gen by PCR, Nasal     Status: None   Collection Time: 11/14/20  5:38 AM   Specimen: Nasal Mucosa; Nasal Swab  Result Value Ref Range Status   MRSA by PCR Next Gen NOT DETECTED NOT DETECTED Final    Comment: (NOTE) The GeneXpert MRSA Assay (FDA approved for NASAL specimens only), is one component of a comprehensive MRSA colonization surveillance program. It is not intended to diagnose MRSA infection nor to guide or monitor treatment for MRSA infections. Test performance is not FDA approved in patients less than 72 years old. Performed at [facility], [place] [address] [place], [place] [address]     [facility] If 7PM-7AM, please contact night-coverage at [email or web address], Office  [phone]   11/22/2020, 1:43 PM  LOS: 8 days

## 2020-11-22 NOTE — Progress Notes (Signed)
IP rehab admissions - Awaiting call back from [facility] carrier regarding potential acute inpatient rehab admission.  Will have my partner follow up tomorrow.  Call for questions.  [phone]

## 2020-11-22 NOTE — Telephone Encounter (Signed)
FMLA has been located and will be placed in provider folder for next week

## 2020-11-22 NOTE — Progress Notes (Signed)
Occupational Therapy Treatment Patient Details Name: [name] MRN: [id number] DOB: [birth year] Today's Date: 11/22/2020   History of present illness 55 y.o. male admitted 11/14/20 with fall from w/c with c/o L-side pain, no LOC; pt found to have afib with RVR, anemia. Imaging negative for acute injury. S/p colonoscopy and EGD 11/6 which showed polyp and esophageal ulcer. PMH includes CVA (uses w/c at baseline), HTN, DM2, diastolic dysfunction, gout, obesity.   OT comments  Pt continues to present with high motivation to participate in therapy. Pt performing sit<>stand with Mod A and sara stedy to transition to [facility] for toileting. Total A for peri care. Pt transferring to recliner with use of stedy. Pt performing oral care and washing his face with Mod A for bilateral coordination. Discussed splint wear schedule with pt and wife; wife reporting they would wear splint as home "as they remembered" but feel he should wear it more consistently. Set up schedule for four hour on and off during day and then to wear at night; MD verbal order provided. Continue to recommend dc to CIR and will continue to follow acutely as admitted.    Recommendations for follow up therapy are one component of a multi-disciplinary discharge planning process, led by the attending physician.  Recommendations may be updated based on patient status, additional functional criteria and insurance authorization.    Follow Up Recommendations  Acute inpatient rehab (3hours/day)    Assistance Recommended at Discharge Frequent or constant Supervision/Assistance  Equipment Recommendations  Other (comment) (defer to next venue)    Recommendations for Other Services PT consult;Rehab consult    Precautions / Restrictions Precautions Precautions: Fall;Other (comment) Precaution Comments: H/o CVA with residual L hemiparesis; bowel incontinence       Mobility Bed Mobility Overal bed mobility: Needs Assistance Bed Mobility:  Supine to Sit     Supine to sit: Mod assist;Min assist;HOB elevated     General bed mobility comments: Pt able to bring BLEs towards EOB with Min A. elevating trunk and managing R hip with Mod A    Transfers Overall transfer level: Needs assistance Equipment used: Ambulation equipment used Transfers: Bed to chair/wheelchair/BSC   Stand pivot transfers: Mod assist         General transfer comment: Mod A for sit<>stand with stedy to then transfer to [facility] and then recliner. Cues for weight shift forward. Transfer via Lift Equipment: Stedy   Balance Overall balance assessment: Needs assistance Sitting-balance support: No upper extremity supported;Feet supported Sitting balance-Leahy Scale: Fair Sitting balance - Comments: Improved ability to achieve and maintain midline posture and static sitting balance at edge of bed and edge of recliner   Standing balance support: Single extremity supported;During functional activity;Reliant on assistive device for balance Standing balance-Leahy Scale: Poor Standing balance comment: Reliant on use of stedy, RUE support and external assist                           ADL either performed or assessed with clinical judgement   ADL Overall ADL's : Needs assistance/impaired     Grooming: Oral care;Moderate assistance;Sitting Grooming Details (indicate cue type and reason): Mod A for bilateral tasks                 Toilet Transfer: Moderate assistance;Total assistance;BSC/3in1 (stedy sit<>stand) Toilet Transfer Details (indicate cue type and reason): Transfer to [facility] with stedy and Mod A Toileting- Clothing Manipulation and Hygiene: Total assistance;+2 for physical assistance;Sit to/from stand Toileting -  Clothing Manipulation Details (indicate cue type and reason): Assistance for standing and then Total A for peri care     Functional mobility during ADLs: Moderate assistance;+2 for physical assistance (sit<>stand) General ADL  Comments: Pt performing sit<>stand with stedy to then transfer to [facility]. Requiring assistance for peri care. Then performing sit<>stand and then transfer to recliner. Discussed splint wear schedule with Pt and his wife.    Extremity/Trunk Assessment Upper Extremity Assessment Upper Extremity Assessment: LUE deficits/detail LUE Deficits / Details: Hemi with tendency for flexion of hand, wrist, and elbow. subluxation of one finger width; no reported pain. Able to perform AROM to lift arm off lap and place on pillow Pt reporting he wears a resting hand splint in the mornings. Noting his digits tight in flexion. Able to PROM into extension with increased time and effort. LUE Coordination: decreased fine motor;decreased gross motor   Lower Extremity Assessment Lower Extremity Assessment: Defer to PT evaluation        Vision       Perception     Praxis      Cognition Arousal/Alertness: Awake/alert Behavior During Therapy: WFL for tasks assessed/performed;Flat affect Overall Cognitive Status: Within Functional Limits for tasks assessed                                 General Comments: Pt following simple commands with increased time. Requiring increased time with slowed speech and pentential slow processing. Upon arrival, pt (and wife) reporting thye had called for bathroom assistance and no one had come, so pt frustrated and requiring increased encouragement. Once encouraged, pt very motivated and hard working          Exercises     Shoulder Instructions       General Comments Pt's wife present throughout. VSS on RA    Pertinent Vitals/ Pain       Pain Assessment: Faces Faces Pain Scale: Hurts a little bit Pain Location: R knee Pain Descriptors / Indicators: Discomfort Pain Intervention(s): Monitored during session;Limited activity within patient's tolerance;Repositioned  Home Living                                          Prior  Functioning/Environment              Frequency  Min 2X/week        Progress Toward Goals  OT Goals(current goals can now be found in the care plan section)  Progress towards OT goals: Progressing toward goals  Acute Rehab OT Goals Patient Stated Goal: Go to rehab OT Goal Formulation: With patient Time For Goal Achievement: 12/04/20 Potential to Achieve Goals: Good ADL Goals Pt Will Perform Grooming: with set-up;with supervision;with adaptive equipment;sitting Pt Will Perform Upper Body Dressing: with min guard assist;sitting Pt Will Perform Lower Body Dressing: with min assist;with caregiver independent in assisting;sit to/from stand Pt Will Transfer to Toilet: with min assist;stand pivot transfer;bedside commode Pt Will Perform Toileting - Clothing Manipulation and hygiene: with min assist;sit to/from stand;sitting/lateral leans;with caregiver independent in assisting  Plan Discharge plan remains appropriate    Co-evaluation                 AM-PAC OT "6 Clicks" Daily Activity     Outcome Measure   Help from another person eating meals?: A Little Help from another person  taking care of personal grooming?: A Little Help from another person toileting, which includes using toliet, bedpan, or urinal?: A Lot Help from another person bathing (including washing, rinsing, drying)?: A Lot Help from another person to put on and taking off regular upper body clothing?: A Lot Help from another person to put on and taking off regular lower body clothing?: A Lot 6 Click Score: 14    End of Session Equipment Utilized During Treatment: Gait belt  OT Visit Diagnosis: Unsteadiness on feet (R26.81);Other abnormalities of gait and mobility (R26.89);Muscle weakness (generalized) (M62.81)   Activity Tolerance Patient tolerated treatment well   Patient Left in chair;with call bell/phone within reach   Nurse Communication Mobility status;Need for lift equipment        Time:  6226-3335 OT Time Calculation (min): 43 min  Charges: OT General Charges $OT Visit: 1 Visit OT Treatments $Self Care/Home Management : 38-52 mins  [place], OTR/L Acute Rehab Pager: [phone] Office: [place] 11/22/2020, 5:42 PM

## 2020-11-23 ENCOUNTER — Inpatient Hospital Stay (HOSPITAL_COMMUNITY)
Admission: RE | Admit: 2020-11-23 | Discharge: 2020-12-14 | DRG: 945 | Disposition: A | Discharge status: Home Health | Payer: BC Managed Care – PPO | Source: Intra-hospital | Attending: Physical Medicine and Rehabilitation | Admitting: Physical Medicine and Rehabilitation

## 2020-11-23 ENCOUNTER — Encounter (HOSPITAL_COMMUNITY): Payer: Self-pay | Admitting: Physical Medicine & Rehabilitation

## 2020-11-23 ENCOUNTER — Other Ambulatory Visit: Payer: Self-pay

## 2020-11-23 ENCOUNTER — Other Ambulatory Visit (HOSPITAL_COMMUNITY): Payer: Self-pay

## 2020-11-23 DIAGNOSIS — K2950 Unspecified chronic gastritis without bleeding: Secondary | ICD-10-CM | POA: Diagnosis present

## 2020-11-23 DIAGNOSIS — I69354 Hemiplegia and hemiparesis following cerebral infarction affecting left non-dominant side: Secondary | ICD-10-CM | POA: Diagnosis not present

## 2020-11-23 DIAGNOSIS — L89150 Pressure ulcer of sacral region, unstageable: Secondary | ICD-10-CM | POA: Diagnosis present

## 2020-11-23 DIAGNOSIS — I5032 Chronic diastolic (congestive) heart failure: Secondary | ICD-10-CM | POA: Diagnosis not present

## 2020-11-23 DIAGNOSIS — G8114 Spastic hemiplegia affecting left nondominant side: Secondary | ICD-10-CM

## 2020-11-23 DIAGNOSIS — R5381 Other malaise: Secondary | ICD-10-CM | POA: Diagnosis not present

## 2020-11-23 DIAGNOSIS — K5900 Constipation, unspecified: Secondary | ICD-10-CM | POA: Diagnosis not present

## 2020-11-23 DIAGNOSIS — Z833 Family history of diabetes mellitus: Secondary | ICD-10-CM

## 2020-11-23 DIAGNOSIS — I110 Hypertensive heart disease with heart failure: Secondary | ICD-10-CM | POA: Diagnosis present

## 2020-11-23 DIAGNOSIS — I69328 Other speech and language deficits following cerebral infarction: Secondary | ICD-10-CM

## 2020-11-23 DIAGNOSIS — Z7982 Long term (current) use of aspirin: Secondary | ICD-10-CM

## 2020-11-23 DIAGNOSIS — M1A9XX0 Chronic gout, unspecified, without tophus (tophi): Secondary | ICD-10-CM | POA: Diagnosis present

## 2020-11-23 DIAGNOSIS — I480 Paroxysmal atrial fibrillation: Secondary | ICD-10-CM | POA: Diagnosis present

## 2020-11-23 DIAGNOSIS — M10061 Idiopathic gout, right knee: Secondary | ICD-10-CM

## 2020-11-23 DIAGNOSIS — Z713 Dietary counseling and surveillance: Secondary | ICD-10-CM

## 2020-11-23 DIAGNOSIS — T39395A Adverse effect of other nonsteroidal anti-inflammatory drugs [NSAID], initial encounter: Secondary | ICD-10-CM | POA: Diagnosis present

## 2020-11-23 DIAGNOSIS — I4891 Unspecified atrial fibrillation: Secondary | ICD-10-CM

## 2020-11-23 DIAGNOSIS — Z85038 Personal history of other malignant neoplasm of large intestine: Secondary | ICD-10-CM | POA: Diagnosis not present

## 2020-11-23 DIAGNOSIS — F32A Depression, unspecified: Secondary | ICD-10-CM | POA: Diagnosis not present

## 2020-11-23 DIAGNOSIS — Z6841 Body Mass Index (BMI) 40.0 and over, adult: Secondary | ICD-10-CM

## 2020-11-23 DIAGNOSIS — Z8249 Family history of ischemic heart disease and other diseases of the circulatory system: Secondary | ICD-10-CM | POA: Diagnosis not present

## 2020-11-23 DIAGNOSIS — G4700 Insomnia, unspecified: Secondary | ICD-10-CM | POA: Diagnosis not present

## 2020-11-23 DIAGNOSIS — M47816 Spondylosis without myelopathy or radiculopathy, lumbar region: Secondary | ICD-10-CM | POA: Diagnosis not present

## 2020-11-23 DIAGNOSIS — E875 Hyperkalemia: Secondary | ICD-10-CM | POA: Diagnosis not present

## 2020-11-23 DIAGNOSIS — K633 Ulcer of intestine: Secondary | ICD-10-CM | POA: Diagnosis not present

## 2020-11-23 DIAGNOSIS — R52 Pain, unspecified: Secondary | ICD-10-CM

## 2020-11-23 DIAGNOSIS — Z96642 Presence of left artificial hip joint: Secondary | ICD-10-CM | POA: Diagnosis present

## 2020-11-23 DIAGNOSIS — Z800 Family history of malignant neoplasm of digestive organs: Secondary | ICD-10-CM

## 2020-11-23 DIAGNOSIS — K253 Acute gastric ulcer without hemorrhage or perforation: Secondary | ICD-10-CM | POA: Diagnosis not present

## 2020-11-23 DIAGNOSIS — L8990 Pressure ulcer of unspecified site, unspecified stage: Secondary | ICD-10-CM | POA: Insufficient documentation

## 2020-11-23 DIAGNOSIS — L89890 Pressure ulcer of other site, unstageable: Secondary | ICD-10-CM | POA: Diagnosis not present

## 2020-11-23 DIAGNOSIS — L89159 Pressure ulcer of sacral region, unspecified stage: Secondary | ICD-10-CM

## 2020-11-23 DIAGNOSIS — I10 Essential (primary) hypertension: Secondary | ICD-10-CM | POA: Diagnosis present

## 2020-11-23 DIAGNOSIS — G40909 Epilepsy, unspecified, not intractable, without status epilepticus: Secondary | ICD-10-CM | POA: Diagnosis present

## 2020-11-23 DIAGNOSIS — R29818 Other symptoms and signs involving the nervous system: Secondary | ICD-10-CM | POA: Diagnosis not present

## 2020-11-23 DIAGNOSIS — E1165 Type 2 diabetes mellitus with hyperglycemia: Secondary | ICD-10-CM

## 2020-11-23 DIAGNOSIS — G9389 Other specified disorders of brain: Secondary | ICD-10-CM | POA: Diagnosis not present

## 2020-11-23 DIAGNOSIS — I69359 Hemiplegia and hemiparesis following cerebral infarction affecting unspecified side: Secondary | ICD-10-CM

## 2020-11-23 DIAGNOSIS — N179 Acute kidney failure, unspecified: Secondary | ICD-10-CM | POA: Diagnosis present

## 2020-11-23 DIAGNOSIS — Z79899 Other long term (current) drug therapy: Secondary | ICD-10-CM

## 2020-11-23 DIAGNOSIS — M10261 Drug-induced gout, right knee: Secondary | ICD-10-CM | POA: Diagnosis not present

## 2020-11-23 DIAGNOSIS — E871 Hypo-osmolality and hyponatremia: Secondary | ICD-10-CM | POA: Diagnosis not present

## 2020-11-23 DIAGNOSIS — E1169 Type 2 diabetes mellitus with other specified complication: Secondary | ICD-10-CM | POA: Diagnosis not present

## 2020-11-23 DIAGNOSIS — L89152 Pressure ulcer of sacral region, stage 2: Secondary | ICD-10-CM

## 2020-11-23 DIAGNOSIS — Z993 Dependence on wheelchair: Secondary | ICD-10-CM

## 2020-11-23 DIAGNOSIS — L89300 Pressure ulcer of unspecified buttock, unstageable: Secondary | ICD-10-CM | POA: Diagnosis not present

## 2020-11-23 DIAGNOSIS — Z7984 Long term (current) use of oral hypoglycemic drugs: Secondary | ICD-10-CM

## 2020-11-23 LAB — GLUCOSE, CAPILLARY
Glucose-Capillary: 134 mg/dL — ABNORMAL HIGH (ref 70–99)
Glucose-Capillary: 136 mg/dL — ABNORMAL HIGH (ref 70–99)
Glucose-Capillary: 120 mg/dL — ABNORMAL HIGH (ref 70–99)
Glucose-Capillary: 120 mg/dL — ABNORMAL HIGH (ref 70–99)

## 2020-11-23 LAB — BASIC METABOLIC PANEL
Anion gap: 9 (ref 5–15)
BUN: 21 mg/dL — ABNORMAL HIGH (ref 6–20)
CO2: 21 mmol/L — ABNORMAL LOW (ref 22–32)
Calcium: 8.4 mg/dL — ABNORMAL LOW (ref 8.9–10.3)
Chloride: 106 mmol/L (ref 98–111)
Creatinine, Ser: 1.31 mg/dL — ABNORMAL HIGH (ref 0.61–1.24)
GFR, Estimated: >60 mL/min (ref >60)
Glucose, Bld: 129 mg/dL — ABNORMAL HIGH (ref 70–99)
Potassium: 4 mmol/L (ref 3.5–5.1)
Sodium: 136 mmol/L (ref 135–145)

## 2020-11-23 MED ORDER — PROCHLORPERAZINE MALEATE 5 MG PO TABS: 5.0000 mg | ORAL_TABLET | Freq: Four times a day (QID) | ORAL | Status: DC | PRN

## 2020-11-23 MED ORDER — DILTIAZEM HCL 60 MG PO TABS
60.0000 mg | ORAL_TABLET | Freq: Four times a day (QID) | ORAL | Status: DC
  Administered 2020-11-23 – 2020-11-28 (×17): 60 mg via ORAL
  Filled 2020-11-23 (×19): qty 1

## 2020-11-23 MED ORDER — PROCHLORPERAZINE EDISYLATE 10 MG/2ML IJ SOLN: 5.0000 mg | Freq: Four times a day (QID) | INTRAMUSCULAR | Status: DC | PRN

## 2020-11-23 MED ORDER — FLEET ENEMA 7-19 GM/118ML RE ENEM: 1.0000 | ENEMA | Freq: Once | RECTAL | Status: DC | PRN

## 2020-11-23 MED ORDER — OXYBUTYNIN CHLORIDE 5 MG PO TABS
5.0000 mg | ORAL_TABLET | Freq: Two times a day (BID) | ORAL | Status: DC
  Administered 2020-11-23 – 2020-12-14 (×42): 5 mg via ORAL
  Filled 2020-11-23 (×42): qty 1

## 2020-11-23 MED ORDER — ACETAMINOPHEN-CODEINE #3 300-30 MG PO TABS
1.0000 | ORAL_TABLET | Freq: Three times a day (TID) | ORAL | Status: DC | PRN
  Administered 2020-11-28: 1 via ORAL
  Filled 2020-11-23: qty 1

## 2020-11-23 MED ORDER — ACETAMINOPHEN 325 MG PO TABS
325.0000 mg | ORAL_TABLET | ORAL | Status: DC | PRN
  Administered 2020-11-30: 325 mg via ORAL
  Administered 2020-12-04: 650 mg via ORAL
  Administered 2020-12-09: 18:00:00 325 mg via ORAL
  Administered 2020-12-09 – 2020-12-13 (×2): 650 mg via ORAL
  Filled 2020-11-23 (×4): qty 2
  Filled 2020-11-23: qty 1

## 2020-11-23 MED ORDER — COLCHICINE 0.6 MG PO TABS
0.6000 mg | ORAL_TABLET | Freq: Every day | ORAL | Status: DC
  Administered 2020-11-24 – 2020-11-26 (×3): 0.6 mg via ORAL
  Filled 2020-11-23 (×3): qty 1

## 2020-11-23 MED ORDER — INSULIN ASPART 100 UNIT/ML IJ SOLN
0.0000 [IU] | Freq: Three times a day (TID) | INTRAMUSCULAR | Status: DC
Start: 2020-11-24 — End: 2020-12-14
  Administered 2020-11-24 – 2020-12-02 (×22): 1 [IU] via SUBCUTANEOUS
  Administered 2020-12-02: 2 [IU] via SUBCUTANEOUS
  Administered 2020-12-03: 1 [IU] via SUBCUTANEOUS
  Administered 2020-12-03 (×2): 2 [IU] via SUBCUTANEOUS
  Administered 2020-12-04 – 2020-12-06 (×7): 1 [IU] via SUBCUTANEOUS
  Administered 2020-12-07: 2 [IU] via SUBCUTANEOUS
  Administered 2020-12-07 – 2020-12-10 (×9): 1 [IU] via SUBCUTANEOUS
  Administered 2020-12-11: 2 [IU] via SUBCUTANEOUS
  Administered 2020-12-12 – 2020-12-14 (×5): 1 [IU] via SUBCUTANEOUS

## 2020-11-23 MED ORDER — ALBUTEROL SULFATE (2.5 MG/3ML) 0.083% IN NEBU: 2.5000 mg | INHALATION_SOLUTION | Freq: Four times a day (QID) | RESPIRATORY_TRACT | Status: DC | PRN

## 2020-11-23 MED ORDER — AMIODARONE HCL 200 MG PO TABS
200.0000 mg | ORAL_TABLET | Freq: Two times a day (BID) | ORAL | Status: DC
  Administered 2020-11-23 – 2020-12-14 (×42): 200 mg via ORAL
  Filled 2020-11-23 (×42): qty 1

## 2020-11-23 MED ORDER — ATORVASTATIN CALCIUM 80 MG PO TABS
80.0000 mg | ORAL_TABLET | Freq: Every day | ORAL | Status: DC
  Administered 2020-11-24 – 2020-12-14 (×21): 80 mg via ORAL
  Filled 2020-11-23 (×22): qty 1

## 2020-11-23 MED ORDER — PANTOPRAZOLE SODIUM 40 MG PO TBEC
40.0000 mg | DELAYED_RELEASE_TABLET | Freq: Two times a day (BID) | ORAL | Status: DC
  Administered 2020-11-23 – 2020-12-14 (×42): 40 mg via ORAL
  Filled 2020-11-23 (×42): qty 1

## 2020-11-23 MED ORDER — PROCHLORPERAZINE 25 MG RE SUPP: 12.5000 mg | Freq: Four times a day (QID) | RECTAL | Status: DC | PRN

## 2020-11-23 MED ORDER — GUAIFENESIN-DM 100-10 MG/5ML PO SYRP: 5.0000 mL | ORAL_SOLUTION | Freq: Four times a day (QID) | ORAL | Status: DC | PRN

## 2020-11-23 MED ORDER — CARVEDILOL 25 MG PO TABS
25.0000 mg | ORAL_TABLET | Freq: Two times a day (BID) | ORAL | Status: DC
  Administered 2020-11-24 – 2020-12-13 (×38): 25 mg via ORAL
  Filled 2020-11-23 (×38): qty 1

## 2020-11-23 MED ORDER — DIPHENHYDRAMINE HCL 12.5 MG/5ML PO ELIX: 12.5000 mg | ORAL_SOLUTION | Freq: Four times a day (QID) | ORAL | Status: DC | PRN

## 2020-11-23 MED ORDER — INSULIN ASPART 100 UNIT/ML IJ SOLN
0.0000 [IU] | Freq: Every day | INTRAMUSCULAR | Status: DC
Start: 2020-11-23 — End: 2020-12-14

## 2020-11-23 MED ORDER — TRAZODONE HCL 50 MG PO TABS
25.0000 mg | ORAL_TABLET | Freq: Every evening | ORAL | Status: DC | PRN
  Administered 2020-12-04: 50 mg via ORAL
  Filled 2020-11-23: qty 1

## 2020-11-23 MED ORDER — POLYETHYLENE GLYCOL 3350 17 G PO PACK
17.0000 g | PACK | Freq: Every day | ORAL | Status: DC | PRN
  Administered 2020-12-06: 17 g via ORAL
  Filled 2020-11-23: qty 1

## 2020-11-23 MED ORDER — ALUM & MAG HYDROXIDE-SIMETH 200-200-20 MG/5ML PO SUSP: 30.0000 mL | ORAL | Status: DC | PRN

## 2020-11-23 MED ORDER — BISACODYL 10 MG RE SUPP: 10.0000 mg | Freq: Every day | RECTAL | Status: DC | PRN

## 2020-11-23 MED ORDER — BENEPROTEIN PO POWD
1.0000 | Freq: Three times a day (TID) | ORAL | Status: DC
  Administered 2020-11-24 – 2020-12-13 (×39): 6 g via ORAL
  Filled 2020-11-23 (×2): qty 227

## 2020-11-23 MED ORDER — GERHARDT'S BUTT CREAM
TOPICAL_CREAM | CUTANEOUS | Status: DC | PRN
  Filled 2020-11-23: qty 1

## 2020-11-23 NOTE — Plan of Care (Signed)

## 2020-11-23 NOTE — Progress Notes (Signed)
Patient ID: [name], male   DOB: Aug 12, 1965, 55 y.o.   MRN: [id number] Patient admitted to 5C02, A&Ox4, no complaints of pain. Stage 2 on bottom covered with foam

## 2020-11-23 NOTE — Progress Notes (Signed)
Inpatient Rehab Admissions Coordinator:  ° °I have a bed for this pt. On CIR today. RN may call report to [phone]. ° °Enya Bureau, MS, CCC-SLP °Rehab Admissions Coordinator  °[phone] (celll) °[phone] (office) ° °

## 2020-11-23 NOTE — Progress Notes (Signed)
Inpatient Rehabilitation Admission Medication Review by a Pharmacist  A complete drug regimen review was completed for this patient to identify any potential clinically significant medication issues.  High Risk Drug Classes Is patient taking? Indication by Medication  Antipsychotic No   Anticoagulant No   Antibiotic No   Opioid No   Antiplatelet No   Hypoglycemics/insulin Yes Insulin- T2DM  Vasoactive Medication Yes Amiodarone/coreg/diltiazem- PAF, HTN  Chemotherapy No   Other No      Type of Medication Issue Identified Description of Issue Recommendation(s)  Drug Interaction(s) (clinically significant)     Duplicate Therapy     Allergy     No Medication Administration End Date     Incorrect Dose     Additional Drug Therapy Needed     Significant med changes from prior encounter (inform family/care partners about these prior to discharge).    Other  Home medications stopped during admission dated 11/14/2020- amlodipine, diclofenac gel, metformin, olmesartan Restart home medications when and if clinically appropriate documenting rationale for discontinuing/changing medications prior to discharge from rehabilitative services     Clinically significant medication issues were identified that warrant physician communication and completion of prescribed/recommended actions by midnight of the next day:  No  Pharmacist comments: none  Time spent performing this drug regimen review (minutes):  30   [name] BS, PharmD, BCPS Clinical Pharmacist  11/23/2020 9:17 PM

## 2020-11-23 NOTE — H&P (Signed)
Physical Medicine and Rehabilitation Admission H&P        Chief Complaint  Patient presents with    functional deficits secondary to cardiac debility       HPI: [name] is a 55 year old male with history of HTN, CVA with left hemiplegia, T2DM, seizure disorder, colon cancer, recent gout flare who was admitted on 11/14/20 /with complaints of left-sided and fall while trying to transfer out of his wheelchair.  He was found to have A. fib with RVR and was started on Cardizem drip for rate control and converted to NSR.  CT chest/abdomen/pelvis was negative for acute abnormality and cholelithiasis without inflammation.  2D echo showed EF 60 to 65% with mild concentric LVH and mild biatrial dilatation. He was started IVF for AKI with SCr- 2.9. Dr. Doylene Canard consulted for input and Coreg added for rate control with recommendations on initiating anticoagulation.     He was started on IV heparin but  developed acute blood loss anemia with drop in hemoglobin from 11.3-9.5.  EGD/colonoscopy ordered for work-up but patient developed nausea vomiting with recurrent PAF and heart rates from 40-160. KUB done for work-up and revealed abdominal distention and showed marked gaseous distention of stomach with air in nondilated large and small bowel to the level of sigmoid. Patient had recurrent A. fib with RVR requiring IV amiodarone and was transitioned to po by 11/05.    UGI/Colonoscopy by Dr. Lorenso Courier on 11/06 revealed many linear esophageal ulcers without bleeding or stigmata of recent bleeding, erythematous mucosa in gastric antrum, multiple ulcers at hepatic flexure of ascending colon and cecum, 4 mm polyps, nonbleeding internal hemorrhoids..  She felt that anemia was likely due to esophageal or colonic ulcers which were felt to be ischemic in nature and.  PPI recommended for treatment of esophageal ulcers. Biopsies taken and revealed chronic gastritis, negative for H. pylori, mild chronic inflammation of  esophagus, colonic mucosa with healed ulcer and tubular adenoma on rectal biopsy. To postpone DOAC for 8 weeks pending repeat EGD. Cholchicine was added on 11/06 due ongoing pain from polyarticular gout. He has had issues with tachypnea and hypotension in the past 24 hours.  Therapy ongoing and patient noted to debilitated with decline in mobility and ability to carry out ADLs.  At baseline, patient was  independent for squat pivot transfers,used WC for mobility and was able to complete ADLs with AE/assist. CIR recommended due to functional decline.      . Review of Systems  Constitutional:  Negative for chills and fever.  HENT:  Negative for hearing loss and tinnitus.   Eyes:  Negative for blurred vision and double vision.  Respiratory:  Negative for shortness of breath.   Cardiovascular:  Negative for chest pain and palpitations.  Gastrointestinal:  Negative for constipation, heartburn and nausea.  Musculoskeletal:  Negative for myalgias.  Skin:  Negative for rash.  Neurological:  Positive for focal weakness. Negative for dizziness, speech change and headaches.  Psychiatric/Behavioral:  Negative for memory loss.           Past Medical History:  Diagnosis Date   Colon cancer ([place]) 2009    rectal   Diabetes mellitus without complication ([place])     Gait abnormality 03/31/2018   Hemiparesis and speech and language deficit as late effects of stroke ([place]) 03/31/2018   Hypertension     Seizure ([place])     Stroke ([place]) 2018           Past Surgical History:  Procedure Laterality Date   APPENDECTOMY       BIOPSY   11/18/2020    Procedure: BIOPSY;  Surgeon: Sharyn Creamer, MD;  Location[facility] ENDOSCOPY;  Service: Gastroenterology;;   COLONOSCOPY N/A 11/18/2020    Procedure: COLONOSCOPY;  Surgeon: Sharyn Creamer, MD;  Location: [facility];  Service: Gastroenterology;  Laterality: N/A;   ESOPHAGOGASTRODUODENOSCOPY (EGD) WITH PROPOFOL N/A 11/18/2020    Procedure: ESOPHAGOGASTRODUODENOSCOPY (EGD)  WITH PROPOFOL;  Surgeon: Sharyn Creamer, MD;  Location: [facility];  Service: Gastroenterology;  Laterality: N/A;   JOINT REPLACEMENT Left 2004    Left hip   POLYPECTOMY   11/18/2020    Procedure: POLYPECTOMY;  Surgeon: Sharyn Creamer, MD;  Location: [facility] ENDOSCOPY;  Service: Gastroenterology;;   TOTAL HIP ARTHROPLASTY               Family History  Problem Relation Age of Onset   Hypertension Mother     Heart disease Mother     Hyperlipidemia Mother     Heart failure Mother     Hypertension Sister     Hypertension Brother     Stroke Father     Hypertension Father     Colon cancer Maternal Grandmother     Diabetes Maternal Grandmother     Esophageal cancer Neg Hx        Social History:  Married. Lives with wife and 6 year old.  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 He reports that he has never smoked. He has never used smokeless tobacco. He reports that he does not currently use alcohol. He reports that he does not use drugs.     Allergies: No Known Allergies           Medications Prior to Admission  Medication Sig Dispense Refill   acetaminophen (TYLENOL) 500 MG tablet Take 1,000 mg by mouth every 6 (six) hours as needed for mild pain.       amLODipine (NORVASC) 10 MG tablet TAKE 1 TABLET BY MOUTH EVERY DAY (Patient taking differently: Take 10 mg by mouth daily.) 90 tablet 0   atorvastatin (LIPITOR) 80 MG tablet TAKE 1 TABLET BY MOUTH EVERY DAY (Patient taking differently: Take 80 mg by mouth daily.) 90 tablet 1   carvedilol (COREG) 25 MG tablet Take 1 tablet (25 mg total) by mouth 2 (two) times daily with a meal. 180 tablet 1   diclofenac Sodium (VOLTAREN) 1 % GEL Apply 2 g topically 4 (four) times daily. (Patient taking differently: Apply 2 g topically 2 (two) times daily as needed (pain).) 100 g 0   metFORMIN (GLUCOPHAGE) 500 MG tablet TAKE 1 TABLET BY MOUTH EVERY DAY WITH BREAKFAST (Patient taking differently: Take 500 mg by mouth daily with breakfast.) 90  tablet 0   olmesartan (BENICAR) 20 MG tablet TAKE 1 TABLET BY MOUTH EVERY DAY (Patient taking differently: Take 20 mg by mouth daily.) 90 tablet 0   oxybutynin (DITROPAN) 5 MG tablet TAKE 1 TABLET BY MOUTH TWICE A DAY 180 tablet 1   acetaminophen-codeine (TYLENOL #3) 300-30 MG tablet Take 1 tablet by mouth every 8 (eight) hours as needed for moderate pain. (Patient not taking: Reported on 11/14/2020) 12 tablet 0   aspirin 325 MG tablet Take 1 tablet (325 mg total) by mouth daily. (Patient not taking: No sig reported) 30 tablet 0      Drug Regimen Review  Drug regimen was reviewed and remains appropriate with no significant issues identified   Home: Home Living  Family/patient expects to be discharged to:: Private residence Living Arrangements: Spouse/significant other, Children Available Help at Discharge: Family, Available 24 hours/day Type of Home: House Home Access: Stairs to enter [employer] of Steps: 1 Entrance Stairs-Rails: None Home Layout: One level Bathroom Shower/Tub: Chiropodist: Standard Home Equipment: Radio producer - quad, BSC/3in1, Wheelchair - manual Additional Comments: Lives with wife and 70 y.o. daughter   Functional History: Prior Function Prior Level of Function : Needs assist Physical Assist : Mobility (physical), ADLs (physical) Mobility (physical): Bed mobility, Transfers, Stairs Mobility Comments: Assist from wife PRN for bed mobility; typically performs stand pivots from bed<>wheelchair<>BSC with PRN assist from wife; assist to bump up 1 step into home in w/c. Wife drives. Enjoys reading and watching tv ADLs Comments: Sits EOB for bathing with assist from wife; typically able to don shoes/socks with adaptive aids; uses BSC for toileting   Functional Status:  Mobility: Bed Mobility Overal bed mobility: Needs Assistance Bed Mobility: Supine to Sit Rolling: Min assist, Mod assist, +2 for safety/equipment, +2 for physical  assistance Supine to sit: Mod assist, Min assist, HOB elevated General bed mobility comments: Pt able to bring BLEs towards EOB with Min A. elevating trunk and managing R hip with Mod A Transfers Overall transfer level: Needs assistance Equipment used: Ambulation equipment used Transfers: Bed to chair/wheelchair/BSC Sit to Stand: Max assist, Mod assist, +2 safety/equipment Bed to/from chair/wheelchair/BSC transfer type:: Stand pivot Stand pivot transfers: Mod assist Transfer via Lift Equipment: Stedy General transfer comment: Mod A for sit<>stand with stedy to then transfer to [facility] and then recliner. Cues for weight shift forward.   ADL: ADL Overall ADL's : Needs assistance/impaired Eating/Feeding: Minimal assistance, Sitting Eating/Feeding Details (indicate cue type and reason): Min A for bilateral tasks Grooming: Oral care, Moderate assistance, Sitting Grooming Details (indicate cue type and reason): Mod A for bilateral tasks Upper Body Bathing: Moderate assistance, Sitting Lower Body Bathing: Maximal assistance, Sit to/from stand Upper Body Dressing : Moderate assistance, Sitting Lower Body Dressing: Maximal assistance, Sit to/from stand Toilet Transfer: Moderate assistance, Total assistance, BSC/3in1 (stedy sit<>stand) Toilet Transfer Details (indicate cue type and reason): Transfer to [facility] with stedy and Mod A Toileting- Clothing Manipulation and Hygiene: Total assistance, +2 for physical assistance, Sit to/from stand Toileting - Clothing Manipulation Details (indicate cue type and reason): Assistance for standing and then Total A for peri care Functional mobility during ADLs: Moderate assistance, +2 for physical assistance (sit<>stand) General ADL Comments: Pt performing sit<>stand with stedy to then transfer to [facility]. Requiring assistance for peri care. Then performing sit<>stand and then transfer to recliner. Discussed splint wear schedule with Pt and his wife.    Cognition: Cognition Overall Cognitive Status: Within Functional Limits for tasks assessed Orientation Level: Oriented X4 Cognition Arousal/Alertness: Awake/alert Behavior During Therapy: WFL for tasks assessed/performed, Flat affect Overall Cognitive Status: Within Functional Limits for tasks assessed General Comments: Pt following simple commands with increased time. Requiring increased time with slowed speech and pentential slow processing. Upon arrival, pt (and wife) reporting thye had called for bathroom assistance and no one had come, so pt frustrated and requiring increased encouragement. Once encouraged, pt very motivated and hard working     Blood pressure 129/83, pulse 79, temperature 98.2 F (36.8 C), temperature source Oral, resp. rate (!) 23, height 5\' 4"  (1.626 m), weight 78.9 kg, SpO2 93 %. Physical Exam Vitals and nursing note reviewed.  Constitutional:      Appearance: Normal appearance.  HENT:  Head: Normocephalic and atraumatic.     Right Ear: External ear normal.     Left Ear: External ear normal.     Nose: Nose normal.     Mouth/Throat:     Mouth: Mucous membranes are moist.  Eyes:     Extraocular Movements: Extraocular movements intact.     Pupils: Pupils are equal, round, and reactive to light.  Cardiovascular:     Rate and Rhythm: Normal rate and regular rhythm.     Heart sounds: No murmur heard.   No gallop.  Pulmonary:     Effort: Pulmonary effort is normal. No respiratory distress.     Breath sounds: No wheezing, rhonchi or rales.  Abdominal:     General: Bowel sounds are normal. There is no distension.     Palpations: Abdomen is soft.     Tenderness: There is no abdominal tenderness.  Musculoskeletal:        General: Swelling (right knee with associated warmth and pain with palpation and PROM) present.     Cervical back: Normal range of motion.  Skin:    General: Skin is warm.  Neurological:     Mental Status: He is alert and oriented to  person, place, and time.     Comments: Dysarthric and measured speech. Left central 7 and tongue deviation. Reasonable insight and awareness. Functional memory. Normal language. LUE 1+ to 2/5 prox to distal. MAS 2 wrist/finger flexors and biceps. LLE 3-/5 prox to 3/5 distally without resting tone. Sensed pain and light touch in all 4's  Psychiatric:        Mood and Affect: Mood normal.        Behavior: Behavior normal.      Lab Results Last 48 Hours        Results for orders placed or performed during the hospital encounter of 11/14/20 (from the past 48 hour(s))  Glucose, capillary     Status: Abnormal    Collection Time: 11/21/20 11:33 AM  Result Value Ref Range    Glucose-Capillary 213 (H) 70 - 99 mg/dL      Comment: Glucose reference range applies only to samples taken after fasting for at least 8 hours.  Glucose, capillary     Status: Abnormal    Collection Time: 11/21/20  4:25 PM  Result Value Ref Range    Glucose-Capillary 141 (H) 70 - 99 mg/dL      Comment: Glucose reference range applies only to samples taken after fasting for at least 8 hours.  Glucose, capillary     Status: Abnormal    Collection Time: 11/21/20  7:45 PM  Result Value Ref Range    Glucose-Capillary 144 (H) 70 - 99 mg/dL      Comment: Glucose reference range applies only to samples taken after fasting for at least 8 hours.  Glucose, capillary     Status: Abnormal    Collection Time: 11/22/20 12:12 AM  Result Value Ref Range    Glucose-Capillary 135 (H) 70 - 99 mg/dL      Comment: Glucose reference range applies only to samples taken after fasting for at least 8 hours.  Basic metabolic panel     Status: Abnormal    Collection Time: 11/22/20  2:58 AM  Result Value Ref Range    Sodium 137 135 - 145 mmol/L    Potassium 3.9 3.5 - 5.1 mmol/L    Chloride 107 98 - 111 mmol/L    CO2 22 22 - 32 mmol/L  Glucose, Bld 133 (H) 70 - 99 mg/dL      Comment: Glucose reference range applies only to samples taken after  fasting for at least 8 hours.    BUN 20 6 - 20 mg/dL    Creatinine, Ser 1.32 (H) 0.61 - 1.24 mg/dL    Calcium 8.7 (L) 8.9 - 10.3 mg/dL    GFR, Estimated >60 >60 mL/min      Comment: (NOTE) Calculated using the CKD-EPI Creatinine Equation (2021)      Anion gap 8 5 - 15      Comment: Performed at [facility] [address] [place], [place] [address]  Glucose, capillary     Status: Abnormal    Collection Time: 11/22/20  4:15 AM  Result Value Ref Range    Glucose-Capillary 136 (H) 70 - 99 mg/dL      Comment: Glucose reference range applies only to samples taken after fasting for at least 8 hours.  Glucose, capillary     Status: Abnormal    Collection Time: 11/22/20 11:43 AM  Result Value Ref Range    Glucose-Capillary 137 (H) 70 - 99 mg/dL      Comment: Glucose reference range applies only to samples taken after fasting for at least 8 hours.  Glucose, capillary     Status: Abnormal    Collection Time: 11/22/20  4:21 PM  Result Value Ref Range    Glucose-Capillary 156 (H) 70 - 99 mg/dL      Comment: Glucose reference range applies only to samples taken after fasting for at least 8 hours.  Glucose, capillary     Status: Abnormal    Collection Time: 11/22/20  7:46 PM  Result Value Ref Range    Glucose-Capillary 150 (H) 70 - 99 mg/dL      Comment: Glucose reference range applies only to samples taken after fasting for at least 8 hours.    Comment 1 Notify RN    Glucose, capillary     Status: Abnormal    Collection Time: 11/22/20 11:26 PM  Result Value Ref Range    Glucose-Capillary 128 (H) 70 - 99 mg/dL      Comment: Glucose reference range applies only to samples taken after fasting for at least 8 hours.    Comment 1 Notify RN    Basic metabolic panel     Status: Abnormal    Collection Time: 11/23/20  1:10 AM  Result Value Ref Range    Sodium 136 135 - 145 mmol/L    Potassium 4.0 3.5 - 5.1 mmol/L    Chloride 106 98 - 111 mmol/L    CO2 21 (L) 22 - 32 mmol/L    Glucose,  Bld 129 (H) 70 - 99 mg/dL      Comment: Glucose reference range applies only to samples taken after fasting for at least 8 hours.    BUN 21 (H) 6 - 20 mg/dL    Creatinine, Ser 1.31 (H) 0.61 - 1.24 mg/dL    Calcium 8.4 (L) 8.9 - 10.3 mg/dL    GFR, Estimated >60 >60 mL/min      Comment: (NOTE) Calculated using the CKD-EPI Creatinine Equation (2021)      Anion gap 9 5 - 15      Comment: Performed at [facility] [address], [place], [place] [address]  Glucose, capillary     Status: Abnormal    Collection Time: 11/23/20  3:28 AM  Result Value Ref Range  Glucose-Capillary 134 (H) 70 - 99 mg/dL      Comment: Glucose reference range applies only to samples taken after fasting for at least 8 hours.    Comment 1 Notify RN        Imaging Results (Last 48 hours)  No results found.           Medical Problem List and Plan: 1.  Functional deficits secondary to debility related to new onset a fib and multiple medical             -patient may shower             -ELOS/Goals: 12-14 days, min assist at w/c level 2.  Antithrombotics: -DVT/anticoagulation:  Mechanical: Sequential compression devices, below knee Bilateral lower extremities             -antiplatelet therapy: N/a 3. Pain Management/spasticity: tylenol # 3 prn for pain.   -pt followed by [facility], has had botox in past. Looks as if these are needed again to wrist and finger flexors 4. Mood: LCSW to follow for evaluation and support.              -antipsychotic agents: N/A 5. Neuropsych: This patient is capable of making decisions on his own behalf. 6. Skin/Wound Care: Routine pressure relief Mesures.              --add beneprotein.  7. Fluids/Electrolytes/Nutrition: strict I/O. Check lytes in am.              --Monitor renal status with serial checks.  8. A fib new onset: Monitor HR TID and for symptoms with increase in activity.             --continue Coreg, Cardizem and amiodarone             --Plans for repeat EGD in 8  weeks for clearance on addition of DOAC 9. Gout flare: Continue Colchicine daily for now. Pt says it's improving. 10. Acute on chronic AKI?: Due to NSAIDS for gout flare. Has resolved with SCr -1.3? Baseline .             --Recheck CMET in am and avoid nephrotoxic meds 11. T2DM: Hgb A1c-6.7. Monitor BS ac/hs and use SSI for elevated BS.             --Add CM restrictions. Continue to hold metformin.  12. Chronic diastolic CHF: Monitor I/O. Check daily weights.  --Will monitor for signs of overload. On Lipitor.              --Ace on hold due to recent AKI.  13. Multiple gastric/colonic ulcers: On PPI BID.             --monitor for signs of bleeding.              --Anemia stable. -check hgb on admit     Bary Leriche, PA-C 11/23/2020   I have personally performed a face to face diagnostic evaluation of this patient and formulated the key components of the plan.  Additionally, I have personally reviewed laboratory data, imaging studies, as well as relevant notes and concur with the physician assistant's documentation above.  The patient's status has not changed from the original H&P.  Any changes in documentation from the acute care chart have been noted above.  Meredith Staggers, MD, [facility]

## 2020-11-23 NOTE — Progress Notes (Signed)
PMR Admission Coordinator Pre-Admission Assessment   Patient: [name] is an 55 y.o., male MRN: [id number] DOB: [birth year] Height: _0  (162.6 cm) Weight: 80.3 kg   Insurance Information HMO:     PPO: yes     PCP:      IPA:      80/20:      OTHER:  PRIMARY: [employer] Commercial       Policy#: [id number]      Subscriber: Pt.  Cheryl with [employer] called with approval 11/23/20 for admission 11/23/20-12/03/20 with updates due 11/21.  CM Name: Malachy Mood      Phone#: [phone]     Fax#: [phone]    Pre-Cert#: [id number]       Employer:  Benefits:  Phone #: [phone]     Name: Henrine Screws Date: 01/14/2020 - 01/12/2021 Deductible: $4,000 ($4,000 met) OOP Max: $6,900 ($5,232.48 met) CIR: 80% coverage, 20% co-insurance SNF: 80% coverage, 20% co-insurance; with a limit of 100 visits/cal year (100 remaining) Outpatient:  80% coverage, 20% co-insurance; combined 60 visit limit combined/cal yr (12 remaining) Home Health: 80% coverage, 20% co-insurance; with a limit of 100 visits/cal year (100 remaining) DME: 80% coverage, 20% co-insurance Providers: in network   SECONDARY: [employer]     Policy#: [id number]     Phone#:      Financial Counselor:      Phone#:    The "Data Collection Information Summary" for patients in Inpatient Rehabilitation Facilities with attached "Privacy Act [place] Records" was provided and verbally reviewed with: N/A   Emergency Contact Information Contact Information       Name Relation Home Work Mobile    [place] Spouse [phone]   [phone]           Current Medical History  Patient Admitting Diagnosis: Cardiac Debility History of Present Illness: 55 year old male with history of hypertension, chronic diastolic CHF, CVA with left residual weakness, diabetes mellitus type 2, seizure disorder, history of rectal cancer s/p resection with radiation and chemotherapy came in complaining of left-sided pain.  At  baseline he gets around with a wheelchair, before he came to ED was trying to transition from wheelchair to bed and he missed and fell.  Did not lose consciousness.  He was brought to ED.  He was found to be in A. fib with RVR with heart rate in 140s.  CT scan of the chest/abdomen/pelvis was negative for acute abnormality.  He was started on Cardizem drip. Pt. Was min A with at wheelchair level but has experienced a decline in function since admission. CIR was consulted to assist in return to PLOF.    Patient's medical record from [facility] has been reviewed by the rehabilitation admission coordinator and physician.   Past Medical History      Past Medical History:  Diagnosis Date   Colon cancer ([place]) 2009    rectal   Diabetes mellitus without complication ([place])     Gait abnormality 03/31/2018   Hemiparesis and speech and language deficit as late effects of stroke ([place]) 03/31/2018   Hypertension     Seizure ([place])     Stroke ([place]) 2018      Has the patient had major surgery during 100 days prior to admission? No   Family History   family history includes Colon cancer in his maternal grandmother; Diabetes in his maternal grandmother; Heart disease in his mother; Heart failure in his mother; Hyperlipidemia in his mother; Hypertension in his brother,  father, mother, and sister; Stroke in his father.   Current Medications   Current Facility-Administered Medications:    acetaminophen (TYLENOL) tablet 650 mg, 650 mg, Oral, Q6H PRN, 650 mg at 11/15/20 2339 **OR** acetaminophen (TYLENOL) suppository 650 mg, 650 mg, Rectal, Q6H PRN, Tamala Julian, Rondell A, MD   acetaminophen-codeine (TYLENOL #3) 300-30 MG per tablet 1 tablet, 1 tablet, Oral, Q8H PRN, Fuller Plan A, MD, 1 tablet at 11/17/20 2053   albuterol (PROVENTIL) (2.5 MG/3ML) 0.083% nebulizer solution 2.5 mg, 2.5 mg, Nebulization, Q6H PRN, Tamala Julian, Rondell A, MD   amiodarone (PACERONE) tablet 200 mg, 200 mg, Oral, BID, Harwani,  Mohan, MD, 200 mg at 11/21/20 1114   atorvastatin (LIPITOR) tablet 80 mg, 80 mg, Oral, Daily, Smith, Rondell A, MD, 80 mg at 11/21/20 1114   bisacodyl (DULCOLAX) EC tablet 20 mg, 20 mg, Oral, Once, Gribbin, Sarah J, PA-C   carvedilol (COREG) tablet 25 mg, 25 mg, Oral, BID WC, Smith, Rondell A, MD, 25 mg at 11/20/20 1808   colchicine tablet 0.6 mg, 0.6 mg, Oral, Daily, Harwani, Mohan, MD, 0.6 mg at 11/21/20 1114   diltiazem (CARDIZEM) tablet 60 mg, 60 mg, Oral, Q6H, Charlynne Cousins, MD, 60 mg at 11/21/20 1114   Gerhardt's butt cream, , Topical, PRN, Charlynne Cousins, MD   insulin aspart (novoLOG) injection 0-9 Units, 0-9 Units, Subcutaneous, Q4H, Charlynne Cousins, MD, 3 Units at 11/21/20 1213   LORazepam (ATIVAN) injection 0.5 mg, 0.5 mg, Intravenous, Q6H PRN, Howerter, Justin B, DO, 0.5 mg at 11/16/20 0228   oxybutynin (DITROPAN) tablet 5 mg, 5 mg, Oral, BID, Smith, Rondell A, MD, 5 mg at 11/21/20 1114   pantoprazole (PROTONIX) EC tablet 40 mg, 40 mg, Oral, BID, Charlynne Cousins, MD, 40 mg at 11/21/20 1114   sodium chloride flush (NS) 0.9 % injection 3 mL, 3 mL, Intravenous, Q12H, Smith, Rondell A, MD, 3 mL at 11/21/20 1115   Patients Current Diet:  Diet Order                  Diet - low sodium heart healthy             Diet Heart Room service appropriate? Yes; Fluid consistency: Thin  Diet effective now                         Precautions / Restrictions Precautions Precautions: Fall, Other (comment) Precaution Comments: H/o CVA with residual L hemiparesis; bowel incontinence Restrictions Weight Bearing Restrictions: No    Has the patient had 2 or more falls or a fall with injury in the past year? No   Prior Activity Level Limited Community (1-2x/wk): Went out for appointments   Prior Functional Level Self Care: Did the patient need help bathing, dressing, using the toilet or eating? Needed some help   Indoor Mobility: Did the patient need assistance with  walking from room to room (with or without device)? Needed some help   Stairs: Did the patient need assistance with internal or external stairs (with or without device)? Needed some help   Functional Cognition: Did the patient need help planning regular tasks such as shopping or remembering to take medications? Dependent   Patient Information Are you of Hispanic, Latino/a,or Spanish origin?: A. No, not of Hispanic, Latino/a, or Spanish origin What is your race?: B. Black or African American Do you need or want an interpreter to communicate with a doctor or health care staff?: 0. No  Patient's Response To:  Health Literacy and Transportation Is the patient able to respond to health literacy and transportation needs?: Yes Health Literacy - How often do you need to have someone help you when you read instructions, pamphlets, or other written material from your doctor or pharmacy?: Never In the past 12 months, has lack of transportation kept you from medical appointments or from getting medications?: No In the past 12 months, has lack of transportation kept you from meetings, work, or from getting things needed for daily living?: No   Development worker, international aid / [place] Devices/Equipment: Wellsite geologist, Built-in shower seat, Bedside commode/3-in-1, Wheelchair Home Equipment: Cane - quad, BSC/3in1, Wheelchair - manual   Prior Device Use: Indicate devices/aids used by the patient prior to current illness, exacerbation or injury? Manual wheelchair   Current Functional Level Cognition   Overall Cognitive Status: Within Functional Limits for tasks assessed Orientation Level: Oriented X4 General Comments: WFL for simple tasks, not formally assessed, potentially some slowed processing (may be more related to delayed speech); appears to have good insight into need for post-acute rehab before return home    Extremity Assessment (includes Sensation/Coordination)   Upper Extremity  Assessment: LUE deficits/detail LUE Deficits / Details: Hemi with tendency for flexion of hand, wrist, and elbow. subluxation of one finger width; no reported pain. ABle to perform AROM to lift arm off lap and place on pillow Pt reporting he wears a resting hand splint in the mornings. Noting his digits tight in flexion. Able to PROM into extension with increased time and effort. LUE: Subluxation noted LUE Coordination: decreased fine motor, decreased gross motor  Lower Extremity Assessment: Defer to PT evaluation LLE Deficits / Details: (P) H/o L hemiparesis; <3/5 hip and knee strength LLE Coordination: (P) decreased fine motor, decreased gross motor     ADLs   Overall ADL's : Needs assistance/impaired Eating/Feeding: Minimal assistance, Sitting Eating/Feeding Details (indicate cue type and reason): Min A for bilateral tasks Grooming: Minimal assistance, Sitting Upper Body Bathing: Moderate assistance, Sitting Lower Body Bathing: Maximal assistance, Sit to/from stand Upper Body Dressing : Moderate assistance, Sitting Lower Body Dressing: Maximal assistance, Sit to/from stand Toilet Transfer: Total assistance Toilet Transfer Details (indicate cue type and reason): Transfer to recliner with Maxi-move. Then performing sit<>stand with stedy and Mod A +2 Functional mobility during ADLs: Moderate assistance, +2 for physical assistance (sit<>stand) General ADL Comments: Pt performing transfer to reclienr wiht maxi move and then sit<>stand with stedy. Pt very motivated. demosntrating decrased balance, strength, and activity tolerance compard to baseline function     Mobility   Overal bed mobility: Needs Assistance Bed Mobility: Supine to Sit Rolling: Min assist, Mod assist, +2 for safety/equipment, +2 for physical assistance Supine to sit: Mod assist, HOB elevated General bed mobility comments: ModA for trunk elevation and scooting hips sitting on R-side bed, cued for use of bed rail with RUE,  improved ability to scoot hips to EOB     Transfers   Overall transfer level: Needs assistance Equipment used: Ambulation equipment used Transfers: Sit to/from Stand, Bed to chair/wheelchair/BSC Sit to Stand: Max assist, Mod assist, +2 safety/equipment Bed to/from chair/wheelchair/BSC transfer type:: Via Public house manager via Lift Equipment: Stedy General transfer comment: Pt requiring maxA for trunk elevation standing from low bed height into stedy standing frame, heavy reliance on RUE support to pull into standing; multiple sit<>stands from elevated Stedy seat with consistent modA for trunk elevation; modA for eccentric lowering into low recliner height; frequent verbal/tactile cues  to achieve and maintain full trunk extension     Ambulation / Gait / Stairs / Wheelchair Mobility         Posture / Balance Dynamic Sitting Balance Sitting balance - Comments: Improved ability to achieve and maintain midline posture and static sitting balance at edge of bed and edge of recliner Balance Overall balance assessment: Needs assistance Sitting-balance support: No upper extremity supported, Feet supported Sitting balance-Leahy Scale: Fair Sitting balance - Comments: Improved ability to achieve and maintain midline posture and static sitting balance at edge of bed and edge of recliner Standing balance support: Single extremity supported, During functional activity, Reliant on assistive device for balance Standing balance-Leahy Scale: Poor Standing balance comment: Reliant on use of stedy, RUE support and external assist     Special needs/care consideration Skin n/a, Diabetic management Novolog 1-9 units sQ every 4 hrs, and Special service needs manual wheelchair user at baseline     Previous Home Environment (from acute therapy documentation) Living Arrangements: Spouse/significant other, Children Available Help at Discharge: Family, Available 24 hours/day Type of Home: House Home Layout:  One level Home Access: Stairs to enter Entrance Stairs-Rails: None Entrance Stairs-Number of Steps: 1 Bathroom Shower/Tub: Chiropodist: Standard Home Care Services: Yes Type of Home Care Services: Home RN, Home PT Additional Comments: Lives with wife and 66 y.o. daughter   Discharge Living Setting Plans for Discharge Living Setting: Patient's home Type of Home at Discharge: House Discharge Home Layout: One level Discharge Home Access: Stairs to enter Entrance Stairs-Rails: None Entrance Stairs-Number of Steps: 1 Discharge Bathroom Shower/Tub: Tub/shower unit Discharge Bathroom Toilet: Standard Discharge Bathroom Accessibility: Yes How Accessible: Accessible via wheelchair, Accessible via walker Does the patient have any problems obtaining your medications?: No   Social/Family/Support Systems Patient Roles: Spouse Contact Information: [phone] Anticipated Caregiver: [name] Anticipated Caregiver's Contact Information: [phone] Ability/Limitations of Caregiver: Can provide min A Caregiver Availability: 24/7 Discharge Plan Discussed with Primary Caregiver: Yes Is Caregiver In Agreement with Plan?: Yes   Goals Patient/Family Goal for Rehab: PT/OT Min A wheelchair level Expected length of stay: 12-14 days Pt/Family Agrees to Admission and willing to participate: Yes Program Orientation Provided & Reviewed with Pt/Caregiver Including Roles  & Responsibilities: Yes   Decrease burden of Care through IP rehab admission: Specialzed equipment needs, Decrease number of caregivers, Bowel and bladder program, and Patient/family education   Possible need for SNF placement upon discharge: not anticipated    Patient Condition: I have reviewed medical records from [facility], spoken with CM, and patient and spouse. I met with patient at the bedside for inpatient rehabilitation assessment.  Patient will benefit from ongoing PT and OT, can  actively participate in 3 hours of therapy a day 5 days of the week, and can make measurable gains during the admission.  Patient will also benefit from the coordinated team approach during an Inpatient Acute Rehabilitation admission.  The patient will receive intensive therapy as well as Rehabilitation physician, nursing, social worker, and care management interventions.  Due to safety, skin/wound care, disease management, medication administration, pain management, and patient education the patient requires 24 hour a day rehabilitation nursing.  The patient is currently max A with mobility and basic ADLs.  Discharge setting and therapy post discharge at home with home health is anticipated.  Patient has agreed to participate in the Acute Inpatient Rehabilitation Program and will admit today.   Preadmission Screen Completed By:  Genella Mech, 11/21/2020 3:59 PM ______________________________________________________________________  Discussed status with Dr. Naaman Plummer on 11/23/20 at 1000 and received approval for admission today.   Admission Coordinator:  Genella Mech, CCC-SLP, time 1000/Date 11/23/20    Assessment/Plan: Diagnosis: debility Does the need for close, 24 hr/day Medical supervision in concert with the patient's rehab needs make it unreasonable for this patient to be served in a less intensive setting? Yes Co-Morbidities requiring supervision/potential complications: hx of CVA with residual L HP, CHF, dm2, seizure disorder Due to bladder management, bowel management, safety, skin/wound care, disease management, medication administration, pain management, and patient education, does the patient require 24 hr/day rehab nursing? Yes Does the patient require coordinated care of a physician, rehab nurse, PT, OT, and SLP to address physical and functional deficits in the context of the above medical diagnosis(es)? Yes Addressing deficits in the following areas: balance, endurance, locomotion,  strength, transferring, bowel/bladder control, bathing, dressing, feeding, grooming, toileting, and psychosocial support Can the patient actively participate in an intensive therapy program of at least 3 hrs of therapy 5 days a week? Yes The potential for patient to make measurable gains while on inpatient rehab is excellent Anticipated functional outcomes upon discharge from inpatient rehab: min assist PT, min assist OT, n/a SLP at w/c level Estimated rehab length of stay to reach the above functional goals is: 12-14 days Anticipated discharge destination: Home 10. Overall Rehab/Functional Prognosis: excellent     MD Signature: Meredith Staggers, MD, [facility] 11/23/2020

## 2020-11-23 NOTE — Progress Notes (Signed)
PT Cancellation Note  Patient Details Name: [name] MRN: [id number] DOB: [birth year]   Cancelled Treatment:    Reason Eval/Treat Not Completed: Patient discharging to CIR, will defer treatment.  Mabeline Caras, PT, DPT Acute Rehabilitation Services  Pager [phone] Office [place] 11/23/2020, 5:49 PM

## 2020-11-23 NOTE — H&P (Signed)
Physical Medicine and Rehabilitation Admission H&P    Chief Complaint  Patient presents with    functional deficits secondary to cardiac debility     HPI: [name] is a 55 year old male with history of HTN, CVA with left hemiplegia, T2DM, seizure disorder, colon cancer, recent gout flare who was admitted on 11/14/20 /with complaints of left-sided and fall while trying to transfer out of his wheelchair.  He was found to have A. fib with RVR and was started on Cardizem drip for rate control and converted to NSR.  CT chest/abdomen/pelvis was negative for acute abnormality and cholelithiasis without inflammation.  2D echo showed EF 60 to 65% with mild concentric LVH and mild biatrial dilatation. He was started IVF for AKI with SCr- 2.9. Dr. Doylene Canard consulted for input and Coreg added for rate control with recommendations on initiating anticoagulation.    He was started on IV heparin but  developed acute blood loss anemia with drop in hemoglobin from 11.3-9.5.  EGD/colonoscopy ordered for work-up but patient developed nausea vomiting with recurrent PAF and heart rates from 40-160. KUB done for work-up and revealed abdominal distention and showed marked gaseous distention of stomach with air in nondilated large and small bowel to the level of sigmoid. Patient had recurrent A. fib with RVR requiring IV amiodarone and was transitioned to po by 11/05.   UGI/Colonoscopy by Dr. Lorenso Courier on 11/06 revealed many linear esophageal ulcers without bleeding or stigmata of recent bleeding, erythematous mucosa in gastric antrum, multiple ulcers at hepatic flexure of ascending colon and cecum, 4 mm polyps, nonbleeding internal hemorrhoids..  She felt that anemia was likely due to esophageal or colonic ulcers which were felt to be ischemic in nature and.  PPI recommended for treatment of esophageal ulcers. Biopsies taken and revealed chronic gastritis, negative for H. pylori, mild chronic inflammation of  esophagus, colonic mucosa with healed ulcer and tubular adenoma on rectal biopsy. To postpone DOAC for 8 weeks pending repeat EGD. Cholchicine was added on 11/06 due ongoing pain from polyarticular gout. He has had issues with tachypnea and hypotension in the past 24 hours.  Therapy ongoing and patient noted to debilitated with decline in mobility and ability to carry out ADLs.  At baseline, patient was  independent for squat pivot transfers,used WC for mobility and was able to complete ADLs with AE/assist. CIR recommended due to functional decline.    . Review of Systems  Constitutional:  Negative for chills and fever.  HENT:  Negative for hearing loss and tinnitus.   Eyes:  Negative for blurred vision and double vision.  Respiratory:  Negative for shortness of breath.   Cardiovascular:  Negative for chest pain and palpitations.  Gastrointestinal:  Negative for constipation, heartburn and nausea.  Musculoskeletal:  Negative for myalgias.  Skin:  Negative for rash.  Neurological:  Positive for focal weakness. Negative for dizziness, speech change and headaches.  Psychiatric/Behavioral:  Negative for memory loss.     Past Medical History:  Diagnosis Date   Colon cancer ([place]) 2009   rectal   Diabetes mellitus without complication ([place])    Gait abnormality 03/31/2018   Hemiparesis and speech and language deficit as late effects of stroke ([place]) 03/31/2018   Hypertension    Seizure ([place])    Stroke ([place]) 2018    Past Surgical History:  Procedure Laterality Date   APPENDECTOMY     BIOPSY  11/18/2020   Procedure: BIOPSY;  Surgeon: Sharyn Creamer, MD;  Location: [facility]  ENDOSCOPY;  Service: Gastroenterology;;   COLONOSCOPY N/A 11/18/2020   Procedure: COLONOSCOPY;  Surgeon: Sharyn Creamer, MD;  Location: [facility];  Service: Gastroenterology;  Laterality: N/A;   ESOPHAGOGASTRODUODENOSCOPY (EGD) WITH PROPOFOL N/A 11/18/2020   Procedure: ESOPHAGOGASTRODUODENOSCOPY (EGD) WITH PROPOFOL;  Surgeon:  Sharyn Creamer, MD;  Location: [facility];  Service: Gastroenterology;  Laterality: N/A;   JOINT REPLACEMENT Left 2004   Left hip   POLYPECTOMY  11/18/2020   Procedure: POLYPECTOMY;  Surgeon: Sharyn Creamer, MD;  Location: [facility] ENDOSCOPY;  Service: Gastroenterology;;   TOTAL HIP ARTHROPLASTY      Family History  Problem Relation Age of Onset   Hypertension Mother    Heart disease Mother    Hyperlipidemia Mother    Heart failure Mother    Hypertension Sister    Hypertension Brother    Stroke Father    Hypertension Father    Colon cancer Maternal Grandmother    Diabetes Maternal Grandmother    Esophageal cancer Neg Hx     Social History:  Married. Lives with wife and 90 year old.  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 He reports that he has never smoked. He has never used smokeless tobacco. He reports that he does not currently use alcohol. He reports that he does not use drugs.   Allergies: No Known Allergies   Medications Prior to Admission  Medication Sig Dispense Refill   acetaminophen (TYLENOL) 500 MG tablet Take 1,000 mg by mouth every 6 (six) hours as needed for mild pain.     amLODipine (NORVASC) 10 MG tablet TAKE 1 TABLET BY MOUTH EVERY DAY (Patient taking differently: Take 10 mg by mouth daily.) 90 tablet 0   atorvastatin (LIPITOR) 80 MG tablet TAKE 1 TABLET BY MOUTH EVERY DAY (Patient taking differently: Take 80 mg by mouth daily.) 90 tablet 1   carvedilol (COREG) 25 MG tablet Take 1 tablet (25 mg total) by mouth 2 (two) times daily with a meal. 180 tablet 1   diclofenac Sodium (VOLTAREN) 1 % GEL Apply 2 g topically 4 (four) times daily. (Patient taking differently: Apply 2 g topically 2 (two) times daily as needed (pain).) 100 g 0   metFORMIN (GLUCOPHAGE) 500 MG tablet TAKE 1 TABLET BY MOUTH EVERY DAY WITH BREAKFAST (Patient taking differently: Take 500 mg by mouth daily with breakfast.) 90 tablet 0   olmesartan (BENICAR) 20 MG tablet TAKE 1 TABLET BY  MOUTH EVERY DAY (Patient taking differently: Take 20 mg by mouth daily.) 90 tablet 0   oxybutynin (DITROPAN) 5 MG tablet TAKE 1 TABLET BY MOUTH TWICE A DAY 180 tablet 1   acetaminophen-codeine (TYLENOL #3) 300-30 MG tablet Take 1 tablet by mouth every 8 (eight) hours as needed for moderate pain. (Patient not taking: Reported on 11/14/2020) 12 tablet 0   aspirin 325 MG tablet Take 1 tablet (325 mg total) by mouth daily. (Patient not taking: No sig reported) 30 tablet 0    Drug Regimen Review  Drug regimen was reviewed and remains appropriate with no significant issues identified  Home: Home Living Family/patient expects to be discharged to:: Private residence Living Arrangements: Spouse/significant other, Children Available Help at Discharge: Family, Available 24 hours/day Type of Home: House Home Access: Stairs to enter [employer] of Steps: 1 Entrance Stairs-Rails: None Home Layout: One level Bathroom Shower/Tub: Chiropodist: Standard Home Equipment: Radio producer - quad, BSC/3in1, Wheelchair - manual Additional Comments: Lives with wife and 40 y.o. daughter   Functional History: Prior  Function Prior Level of Function : Needs assist Physical Assist : Mobility (physical), ADLs (physical) Mobility (physical): Bed mobility, Transfers, Stairs Mobility Comments: Assist from wife PRN for bed mobility; typically performs stand pivots from bed<>wheelchair<>BSC with PRN assist from wife; assist to bump up 1 step into home in w/c. Wife drives. Enjoys reading and watching tv ADLs Comments: Sits EOB for bathing with assist from wife; typically able to don shoes/socks with adaptive aids; uses BSC for toileting  Functional Status:  Mobility: Bed Mobility Overal bed mobility: Needs Assistance Bed Mobility: Supine to Sit Rolling: Min assist, Mod assist, +2 for safety/equipment, +2 for physical assistance Supine to sit: Mod assist, Min assist, HOB elevated General bed  mobility comments: Pt able to bring BLEs towards EOB with Min A. elevating trunk and managing R hip with Mod A Transfers Overall transfer level: Needs assistance Equipment used: Ambulation equipment used Transfers: Bed to chair/wheelchair/BSC Sit to Stand: Max assist, Mod assist, +2 safety/equipment Bed to/from chair/wheelchair/BSC transfer type:: Stand pivot Stand pivot transfers: Mod assist Transfer via Lift Equipment: Stedy General transfer comment: Mod A for sit<>stand with stedy to then transfer to [facility] and then recliner. Cues for weight shift forward.      ADL: ADL Overall ADL's : Needs assistance/impaired Eating/Feeding: Minimal assistance, Sitting Eating/Feeding Details (indicate cue type and reason): Min A for bilateral tasks Grooming: Oral care, Moderate assistance, Sitting Grooming Details (indicate cue type and reason): Mod A for bilateral tasks Upper Body Bathing: Moderate assistance, Sitting Lower Body Bathing: Maximal assistance, Sit to/from stand Upper Body Dressing : Moderate assistance, Sitting Lower Body Dressing: Maximal assistance, Sit to/from stand Toilet Transfer: Moderate assistance, Total assistance, BSC/3in1 (stedy sit<>stand) Toilet Transfer Details (indicate cue type and reason): Transfer to [facility] with stedy and Mod A Toileting- Clothing Manipulation and Hygiene: Total assistance, +2 for physical assistance, Sit to/from stand Toileting - Clothing Manipulation Details (indicate cue type and reason): Assistance for standing and then Total A for peri care Functional mobility during ADLs: Moderate assistance, +2 for physical assistance (sit<>stand) General ADL Comments: Pt performing sit<>stand with stedy to then transfer to [facility]. Requiring assistance for peri care. Then performing sit<>stand and then transfer to recliner. Discussed splint wear schedule with Pt and his wife.  Cognition: Cognition Overall Cognitive Status: Within Functional Limits for tasks  assessed Orientation Level: Oriented X4 Cognition Arousal/Alertness: Awake/alert Behavior During Therapy: WFL for tasks assessed/performed, Flat affect Overall Cognitive Status: Within Functional Limits for tasks assessed General Comments: Pt following simple commands with increased time. Requiring increased time with slowed speech and pentential slow processing. Upon arrival, pt (and wife) reporting thye had called for bathroom assistance and no one had come, so pt frustrated and requiring increased encouragement. Once encouraged, pt very motivated and hard working   Blood pressure 129/83, pulse 79, temperature 98.2 F (36.8 C), temperature source Oral, resp. rate (!) 23, height 5\' 4"  (1.626 m), weight 78.9 kg, SpO2 93 %. Physical Exam Vitals and nursing note reviewed.  Constitutional:      Appearance: Normal appearance.  HENT:     Head: Normocephalic and atraumatic.     Right Ear: External ear normal.     Left Ear: External ear normal.     Nose: Nose normal.     Mouth/Throat:     Mouth: Mucous membranes are moist.  Eyes:     Extraocular Movements: Extraocular movements intact.     Pupils: Pupils are equal, round, and reactive to light.  Cardiovascular:  Rate and Rhythm: Normal rate and regular rhythm.     Heart sounds: No murmur heard.   No gallop.  Pulmonary:     Effort: Pulmonary effort is normal. No respiratory distress.     Breath sounds: No wheezing, rhonchi or rales.  Abdominal:     General: Bowel sounds are normal. There is no distension.     Palpations: Abdomen is soft.     Tenderness: There is no abdominal tenderness.  Musculoskeletal:        General: Swelling (right knee with associated warmth and pain with palpation and PROM) present.     Cervical back: Normal range of motion.  Skin:    General: Skin is warm.  Neurological:     Mental Status: He is alert and oriented to person, place, and time.     Comments: Dysarthric and measured speech. Left central 7 and  tongue deviation. Reasonable insight and awareness. Functional memory. Normal language. LUE 1+ to 2/5 prox to distal. MAS 2 wrist/finger flexors and biceps. LLE 3-/5 prox to 3/5 distally without resting tone. Sensed pain and light touch in all 4's  Psychiatric:        Mood and Affect: Mood normal.        Behavior: Behavior normal.    Results for orders placed or performed during the hospital encounter of 11/14/20 (from the past 48 hour(s))  Glucose, capillary     Status: Abnormal   Collection Time: 11/21/20 11:33 AM  Result Value Ref Range   Glucose-Capillary 213 (H) 70 - 99 mg/dL    Comment: Glucose reference range applies only to samples taken after fasting for at least 8 hours.  Glucose, capillary     Status: Abnormal   Collection Time: 11/21/20  4:25 PM  Result Value Ref Range   Glucose-Capillary 141 (H) 70 - 99 mg/dL    Comment: Glucose reference range applies only to samples taken after fasting for at least 8 hours.  Glucose, capillary     Status: Abnormal   Collection Time: 11/21/20  7:45 PM  Result Value Ref Range   Glucose-Capillary 144 (H) 70 - 99 mg/dL    Comment: Glucose reference range applies only to samples taken after fasting for at least 8 hours.  Glucose, capillary     Status: Abnormal   Collection Time: 11/22/20 12:12 AM  Result Value Ref Range   Glucose-Capillary 135 (H) 70 - 99 mg/dL    Comment: Glucose reference range applies only to samples taken after fasting for at least 8 hours.  Basic metabolic panel     Status: Abnormal   Collection Time: 11/22/20  2:58 AM  Result Value Ref Range   Sodium 137 135 - 145 mmol/L   Potassium 3.9 3.5 - 5.1 mmol/L   Chloride 107 98 - 111 mmol/L   CO2 22 22 - 32 mmol/L   Glucose, Bld 133 (H) 70 - 99 mg/dL    Comment: Glucose reference range applies only to samples taken after fasting for at least 8 hours.   BUN 20 6 - 20 mg/dL   Creatinine, Ser 1.32 (H) 0.61 - 1.24 mg/dL   Calcium 8.7 (L) 8.9 - 10.3 mg/dL   GFR, Estimated  >60 >60 mL/min    Comment: (NOTE) Calculated using the CKD-EPI Creatinine Equation (2021)    Anion gap 8 5 - 15    Comment: Performed at [facility] [address] [place], [place] [address]  Glucose, capillary     Status:  Abnormal   Collection Time: 11/22/20  4:15 AM  Result Value Ref Range   Glucose-Capillary 136 (H) 70 - 99 mg/dL    Comment: Glucose reference range applies only to samples taken after fasting for at least 8 hours.  Glucose, capillary     Status: Abnormal   Collection Time: 11/22/20 11:43 AM  Result Value Ref Range   Glucose-Capillary 137 (H) 70 - 99 mg/dL    Comment: Glucose reference range applies only to samples taken after fasting for at least 8 hours.  Glucose, capillary     Status: Abnormal   Collection Time: 11/22/20  4:21 PM  Result Value Ref Range   Glucose-Capillary 156 (H) 70 - 99 mg/dL    Comment: Glucose reference range applies only to samples taken after fasting for at least 8 hours.  Glucose, capillary     Status: Abnormal   Collection Time: 11/22/20  7:46 PM  Result Value Ref Range   Glucose-Capillary 150 (H) 70 - 99 mg/dL    Comment: Glucose reference range applies only to samples taken after fasting for at least 8 hours.   Comment 1 Notify RN   Glucose, capillary     Status: Abnormal   Collection Time: 11/22/20 11:26 PM  Result Value Ref Range   Glucose-Capillary 128 (H) 70 - 99 mg/dL    Comment: Glucose reference range applies only to samples taken after fasting for at least 8 hours.   Comment 1 Notify RN   Basic metabolic panel     Status: Abnormal   Collection Time: 11/23/20  1:10 AM  Result Value Ref Range   Sodium 136 135 - 145 mmol/L   Potassium 4.0 3.5 - 5.1 mmol/L   Chloride 106 98 - 111 mmol/L   CO2 21 (L) 22 - 32 mmol/L   Glucose, Bld 129 (H) 70 - 99 mg/dL    Comment: Glucose reference range applies only to samples taken after fasting for at least 8 hours.   BUN 21 (H) 6 - 20 mg/dL   Creatinine, Ser 1.31 (H) 0.61 - 1.24  mg/dL   Calcium 8.4 (L) 8.9 - 10.3 mg/dL   GFR, Estimated >60 >60 mL/min    Comment: (NOTE) Calculated using the CKD-EPI Creatinine Equation (2021)    Anion gap 9 5 - 15    Comment: Performed at [facility] [address], [place], [place] [address]  Glucose, capillary     Status: Abnormal   Collection Time: 11/23/20  3:28 AM  Result Value Ref Range   Glucose-Capillary 134 (H) 70 - 99 mg/dL    Comment: Glucose reference range applies only to samples taken after fasting for at least 8 hours.   Comment 1 Notify RN    No results found.     Medical Problem List and Plan: 1.  Functional deficits secondary to debility related to new onset a fib and multiple medical  -patient may shower  -ELOS/Goals: 12-14 days, min assist at w/c level 2.  Antithrombotics: -DVT/anticoagulation:  Mechanical: Sequential compression devices, below knee Bilateral lower extremities  -antiplatelet therapy: N/a 3. Pain Management: tylenol # 3 prn for pain.  4. Mood: LCSW to follow for evaluation and support.   -antipsychotic agents: N/A 5. Neuropsych: This patient is capable of making decisions on his own behalf. 6. Skin/Wound Care: Routine pressure relief Mesures.   --add beneprotein.  7. Fluids/Electrolytes/Nutrition: strict I/O. Check lytes in am.   --Monitor renal status with serial checks.  8. A fib  new onset: Monitor HR TID and for symptoms with increase in activity.  --continue Coreg, Cardizem and amiodarone  --Plans for repeat EGD in 8 weeks for clearance on addition of DOAC 9. Gout flare: Continue Colchicine daily for now. Pt says it's improving. 10. Acute on chronic AKI?: Due to NSAIDS for gout flare. Has resolved with SCr -1.3? Baseline .  --Recheck CMET in am and avoid nephrotoxic meds 11. T2DM: Hgb A1c-6.7. Monitor BS ac/hs and use SSI for elevated BS.  --Add CM restrictions. Continue to hold metformin.  12. Chronic diastolic CHF: Monitor I/O. Check daily weights.  --Will monitor  for signs of overload. On Lipitor.   --Ace on hold due to recent AKI.  13. Multiple gastric/colonic ulcers: On PPI BID.  --monitor for signs of bleeding.   --Anemia stable. -check hgb on admit   Bary Leriche, PA-C 11/23/2020

## 2020-11-23 NOTE — Progress Notes (Signed)
[facility]  PROGRESS NOTE  [name] ZJQ:[id number] DOB: 1965/03/06 DOA: 11/14/2020 PCP: Ladell Pier, MD   Brief HPI:   55 year old male with history of hypertension, chronic diastolic CHF, CVA with left residual weakness, diabetes mellitus type 2, seizure disorder, history of rectal cancer s/p resection with radiation and chemotherapy came in complaining of left-sided pain.  At baseline he gets around with a wheelchair, before he came to ED was trying to transition from wheelchair to bed and he missed and fell.  Did not lose consciousness.  He was brought to ED.  He was found to be in A. fib with RVR with heart rate in 140s.  CT scan of the chest/abdomen/pelvis was negative for acute abnormality.  He was started on Cardizem drip.    Subjective   No new complaints, waiting to go to CIR.   Assessment/Plan:    New onset A. fib with RVR -CHA2DS2-VASc score 4 -Patient on Coreg, Cardizem, amiodarone -Patient in sinus rhythm -Cardiology was consulted, 2D echo showed EF 60% -GI to decide when to start DOAC; likely after EGD in 8 weeks  Microcytic anemia -GI was consulted for endoscopic procedure -Had to be delayed as patient went into A. fib with RVR -Endoscopically showed esophageal ulcer, GI recommend to continue with PPI and repeat EGD in 8 weeks -Biopsies were taken -Colonoscopy showed cecal polyp   Acute kidney injury -Baseline creatinine 1.2, presented with creatinine of 2.9 -In setting of NSAID and ACE inhibitor use -Resolved with fluid resuscitation -Back to baseline  Diabetes mellitus type 2 -Hemoglobin A1c 6.7 -Continue sliding scale insulin with NovoLog -CBG well controlled  Left flank pain -Secondary to fall -CT chest/abdomen/pelvis showed no acute findings  Hypertension -Blood pressure stable -Continue Coreg, diltiazem.  History chronic diastolic dysfunction -Euvolemic -ACE inhibitor on hold in setting of acute kidney injury  History  of meningioma -Stable on CT head  History of CVA -Continue Lipitor -Follow-up neurology as outpatient and probably resume low-dose aspirin in 2 to 4 weeks     Scheduled medications:    amiodarone  200 mg Oral BID   atorvastatin  80 mg Oral Daily   bisacodyl  20 mg Oral Once   carvedilol  25 mg Oral BID WC   colchicine  0.6 mg Oral Daily   diltiazem  60 mg Oral Q6H   insulin aspart  0-9 Units Subcutaneous Q4H   oxybutynin  5 mg Oral BID   pantoprazole  40 mg Oral BID   sodium chloride flush  3 mL Intravenous Q12H     Data Reviewed:   CBG:  Recent Labs  Lab 11/22/20 1143 11/22/20 1621 11/22/20 1946 11/22/20 2326 11/23/20 0328  GLUCAP 137* 156* 150* 128* 134*    SpO2: 95 % O2 Flow Rate (L/min): 0 L/min FiO2 (%): 21 %    Vitals:   11/23/20 0008 11/23/20 0331 11/23/20 0545 11/23/20 0650  BP: 98/66 100/68  123/83  Pulse:  80  78  Resp:  16    Temp:  98.1 F (36.7 C)    TempSrc:  Oral    SpO2:  95%    Weight:   78.9 kg   Height:         Intake/Output Summary (Last 24 hours) at 11/23/2020 0800 Last data filed at 11/23/2020 0421 Gross per 24 hour  Intake 363 ml  Output 350 ml  Net 13 ml    11/09 1901 - 11/11 0700 In: 240 [P.O.:600; I.V.:3] Out: 1050 [Urine:1050]  Filed Weights   11/20/20 0552 11/21/20 0316 11/23/20 0545  Weight: 83 kg 80.3 kg 78.9 kg    Data Reviewed: Basic Metabolic Panel: Recent Labs  Lab 11/17/20 0052 11/18/20 0044 11/19/20 0225 11/20/20 0103 11/21/20 0107 11/22/20 0258 11/23/20 0110  NA  --    < > 142 139 135 137 136  K  --    < > 3.8 4.2 4.0 3.9 4.0  CL  --    < > 109 107 104 107 106  CO2  --    < > 23 21* 17* 22 21*  GLUCOSE  --    < > 132* 140* 112* 133* 129*  BUN  --    < > 17 21* 21* 20 21*  CREATININE  --    < > 1.37* 1.27* 1.20 1.32* 1.31*  CALCIUM  --    < > 8.9 8.8* 8.5* 8.7* 8.4*  MG 2.4  --   --   --   --   --   --    < > = values in this interval not displayed.   Liver Function Tests: No results  for input(s): AST, ALT, ALKPHOS, BILITOT, PROT, ALBUMIN in the last 168 hours. No results for input(s): LIPASE, AMYLASE in the last 168 hours. No results for input(s): AMMONIA in the last 168 hours. CBC: Recent Labs  Lab 11/17/20 0052 11/18/20 0044 11/19/20 0225  WBC 10.2 7.5 7.0  HGB 10.1* 10.3* 10.6*  HCT 30.1* 30.9* 31.8*  MCV 76.6* 78.0* 77.6*  PLT 261 237 304   Cardiac Enzymes: No results for input(s): CKTOTAL, CKMB, CKMBINDEX, TROPONINI in the last 168 hours.  BNP (last 3 results) No results for input(s): BNP in the last 8760 hours.  ProBNP (last 3 results) No results for input(s): PROBNP in the last 8760 hours.  CBG: Recent Labs  Lab 11/22/20 1143 11/22/20 1621 11/22/20 1946 11/22/20 2326 11/23/20 0328  GLUCAP 137* 156* 150* 128* 134*       Radiology Reports  No results found.     Antibiotics: Anti-infectives (From admission, onward)    None         DVT prophylaxis: SCDs  Code Status: Full code  Family Communication: No family at bedside   Consultants: Cardiology  Procedures:     Objective    Physical Examination:  General-appears in no acute distress Heart-S1-S2, regular, no murmur auscultated Lungs-clear to auscultation bilaterally, no wheezing or crackles auscultated Abdomen-soft, nontender, no organomegaly Extremities-no edema in the lower extremities Neuro-alert, oriented x3, no focal deficit noted   Status is: Inpatient  Dispo: The patient is from: Home              Anticipated d/c is to: CIR              Anticipated d/c date is: 11/25/2020              Patient currently not stable for discharge  Barrier to discharge-awaiting bed at [facility]  No results for input(s): DDIMER, FERRITIN, LDH, CRP in the last 72 hours.  Lab Results  Component Value Date   SARSCOV2NAA NEGATIVE 11/14/2020   SARSCOV2NAA POSITIVE (A) 06/21/2020   [place] Not Detected 12/04/2018            Recent Results  (from the past 240 hour(s))  Resp Panel by RT-PCR (Flu A&B, Covid) Nasopharyngeal Swab     Status: None   Collection Time: 11/14/20  4:02 AM   Specimen: Nasopharyngeal Swab;  Nasopharyngeal(NP) swabs in vial transport medium  Result Value Ref Range Status   SARS Coronavirus 2 by RT PCR NEGATIVE NEGATIVE Final    Comment: (NOTE) SARS-CoV-2 target nucleic acids are NOT DETECTED.  The SARS-CoV-2 RNA is generally detectable in upper respiratory specimens during the acute phase of infection. The lowest concentration of SARS-CoV-2 viral copies this assay can detect is 138 copies/mL. A negative result does not preclude SARS-Cov-2 infection and should not be used as the sole basis for treatment or other patient management decisions. A negative result may occur with  improper specimen collection/handling, submission of specimen other than nasopharyngeal swab, presence of viral mutation(s) within the areas targeted by this assay, and inadequate number of viral copies(<138 copies/mL). A negative result must be combined with clinical observations, patient history, and epidemiological information. The expected result is Negative.  Fact Sheet for Patients:  EntrepreneurPulse.com.au  Fact Sheet for Healthcare Providers:  IncredibleEmployment.be  This test is no t yet approved or cleared by the [place] FDA and  has been authorized for detection and/or diagnosis of SARS-CoV-2 by FDA under an Emergency Use Authorization (EUA). This EUA will remain  in effect (meaning this test can be used) for the duration of the COVID-19 declaration under Section 564(b)(1) of the Act, 21 U.S.C.section 360bbb-3(b)(1), unless the authorization is terminated  or revoked sooner.       Influenza A by PCR NEGATIVE NEGATIVE Final   Influenza B by PCR NEGATIVE NEGATIVE Final    Comment: (NOTE) The Xpert Xpress SARS-CoV-2/FLU/RSV plus assay is intended as an aid in the  diagnosis of influenza from Nasopharyngeal swab specimens and should not be used as a sole basis for treatment. Nasal washings and aspirates are unacceptable for Xpert Xpress SARS-CoV-2/FLU/RSV testing.  Fact Sheet for Patients: EntrepreneurPulse.com.au  Fact Sheet for Healthcare Providers: IncredibleEmployment.be  This test is not yet approved or cleared by the [place] FDA and has been authorized for detection and/or diagnosis of SARS-CoV-2 by FDA under an Emergency Use Authorization (EUA). This EUA will remain in effect (meaning this test can be used) for the duration of the COVID-19 declaration under Section 564(b)(1) of the Act, 21 U.S.C. section 360bbb-3(b)(1), unless the authorization is terminated or revoked.  Performed at [facility], [address], [place], [place] [address]   MRSA Next Gen by PCR, Nasal     Status: None   Collection Time: 11/14/20  5:38 AM   Specimen: Nasal Mucosa; Nasal Swab  Result Value Ref Range Status   MRSA by PCR Next Gen NOT DETECTED NOT DETECTED Final    Comment: (NOTE) The GeneXpert MRSA Assay (FDA approved for NASAL specimens only), is one component of a comprehensive MRSA colonization surveillance program. It is not intended to diagnose MRSA infection nor to guide or monitor treatment for MRSA infections. Test performance is not FDA approved in patients less than 37 years old. Performed at [facility], [place] [address], [place], [place] [address]     [facility] If 7PM-7AM, please contact night-coverage at [email or web address], Office  [phone]   11/23/2020, 8:00 AM  LOS: 9 days

## 2020-11-23 NOTE — Consult Note (Signed)
Ref: Mark Pier, MD   Subjective:  Awake.  Monitor: Sinus rhythm.  Objective:  Vital Signs in the last 24 hours: Temp:  [98 F (36.7 C)-98.2 F (36.8 C)] 98.2 F (36.8 C) (11/11 0757) Pulse Rate:  [74-80] 79 (11/11 0757) Cardiac Rhythm: Normal sinus rhythm (11/10 1900) Resp:  [16-23] 23 (11/11 0757) BP: (92-129)/(66-83) 129/83 (11/11 0757) SpO2:  [93 %-98 %] 93 % (11/11 0757) Weight:  [78.9 kg] 78.9 kg (11/11 0545)  Physical Exam: BP Readings from Last 1 Encounters:  11/23/20 129/83     Wt Readings from Last 1 Encounters:  11/23/20 78.9 kg    Weight change:  Body mass index is 29.86 kg/m. HEENT: [place]/AT, Eyes-Brown, Conjunctiva-Pink, Sclera-Non-icteric Neck: No JVD, No bruit, Trachea midline. Lungs:  Clear, Bilateral. Cardiac:  Regular rhythm, normal S1 and S2, no S3. II/VI systolic murmur. Abdomen:  Soft, non-tender. BS present. Extremities:  No edema present. No cyanosis. No clubbing. CNS: AxOx3, Cranial nerves grossly intact, Left sided weakness. Skin: Warm and dry.   Intake/Output from previous day: 11/10 0701 - 11/11 0700 In: 603 [P.O.:600; I.V.:3] Out: 350 [Urine:350]    Lab Results: BMET    Component Value Date/Time   NA 136 11/23/2020 0110   NA 137 11/22/2020 0258   NA 135 11/21/2020 0107   NA 145 (H) 06/07/2020 1019   NA 142 11/11/2018 1400   NA 144 11/23/2017 1706   K 4.0 11/23/2020 0110   K 3.9 11/22/2020 0258   K 4.0 11/21/2020 0107   CL 106 11/23/2020 0110   CL 107 11/22/2020 0258   CL 104 11/21/2020 0107   CO2 21 (L) 11/23/2020 0110   CO2 22 11/22/2020 0258   CO2 17 (L) 11/21/2020 0107   GLUCOSE 129 (H) 11/23/2020 0110   GLUCOSE 133 (H) 11/22/2020 0258   GLUCOSE 112 (H) 11/21/2020 0107   BUN 21 (H) 11/23/2020 0110   BUN 20 11/22/2020 0258   BUN 21 (H) 11/21/2020 0107   BUN 17 06/07/2020 1019   BUN 11 11/11/2018 1400   BUN 13 01/26/2018 1026   CREATININE 1.31 (H) 11/23/2020 0110   CREATININE 1.32 (H) 11/22/2020 0258    CREATININE 1.20 11/21/2020 0107   CALCIUM 8.4 (L) 11/23/2020 0110   CALCIUM 8.7 (L) 11/22/2020 0258   CALCIUM 8.5 (L) 11/21/2020 0107   GFRNONAA >60 11/23/2020 0110   GFRNONAA >60 11/22/2020 0258   GFRNONAA >60 11/21/2020 0107   GFRAA >60 04/21/2019 1049   GFRAA 87 11/11/2018 1400   GFRAA 85 01/26/2018 1026   CBC    Component Value Date/Time   WBC 7.0 11/19/2020 0225   RBC 4.10 (L) 11/19/2020 0225   HGB 10.6 (L) 11/19/2020 0225   HGB 10.6 (L) 10/15/2020 1025   HGB 14.2 08/02/2008 1026   HCT 31.8 (L) 11/19/2020 0225   HCT 31.8 (L) 10/15/2020 1025   HCT 41.6 08/02/2008 1026   PLT 304 11/19/2020 0225   PLT 211 10/15/2020 1025   MCV 77.6 (L) 11/19/2020 0225   MCV 78 (L) 10/15/2020 1025   MCV 78.4 (L) 08/02/2008 1026   MCH 25.9 (L) 11/19/2020 0225   MCHC 33.3 11/19/2020 0225   RDW 15.3 11/19/2020 0225   RDW 13.6 10/15/2020 1025   RDW 16.3 (H) 08/02/2008 1026   LYMPHSABS 1.3 11/14/2020 0133   LYMPHSABS 1.3 11/11/2018 1400   LYMPHSABS 0.6 (L) 08/02/2008 1026   MONOABS 0.8 11/14/2020 0133   MONOABS 0.2 08/02/2008 1026   EOSABS 0.0 11/14/2020 0133  EOSABS 0.1 11/11/2018 1400   BASOSABS 0.0 11/14/2020 0133   BASOSABS 0.0 11/11/2018 1400   BASOSABS 0.0 08/02/2008 1026   HEPATIC Function Panel Recent Labs    06/07/20 1019 11/14/20 0133  PROT 7.4 8.4*   HEMOGLOBIN A1C No components found for: HGA1C,  MPG CARDIAC ENZYMES Lab Results  Component Value Date   CKTOTAL 348 11/15/2020   BNP No results for input(s): PROBNP in the last 8760 hours. TSH Recent Labs    11/14/20 1246  TSH 1.076   CHOLESTEROL Recent Labs    06/07/20 1019  CHOL 141    Scheduled Meds:  amiodarone  200 mg Oral BID   atorvastatin  80 mg Oral Daily   bisacodyl  20 mg Oral Once   carvedilol  25 mg Oral BID WC   colchicine  0.6 mg Oral Daily   diltiazem  60 mg Oral Q6H   insulin aspart  0-9 Units Subcutaneous Q4H   oxybutynin  5 mg Oral BID   pantoprazole  40 mg Oral BID   sodium  chloride flush  3 mL Intravenous Q12H   Continuous Infusions:  PRN Meds:.acetaminophen **OR** acetaminophen, acetaminophen-codeine, albuterol, Gerhardt's butt cream, LORazepam  Assessment/Plan:  Paroxysmal atrial fibrillation S/P right MCA stroke with left sided weakness Esophageal ulcer Type 2 DM HTN Obesity Gout CKD, II  Plan: Continue medical treatment. Will sign off. Awaiting SNF or rehab.   LOS: 9 days   Time spent including chart review, lab review, examination, discussion with patient/Nurse/Doctor : 30 min   Dixie Dials  MD  11/23/2020, 11:55 AM

## 2020-11-24 LAB — COMPREHENSIVE METABOLIC PANEL
ALT: 42 U/L (ref 0–44)
AST: 32 U/L (ref 15–41)
Albumin: 2.7 g/dL — ABNORMAL LOW (ref 3.5–5.0)
Alkaline Phosphatase: 105 U/L (ref 38–126)
Anion gap: 11 (ref 5–15)
BUN: 21 mg/dL — ABNORMAL HIGH (ref 6–20)
CO2: 20 mmol/L — ABNORMAL LOW (ref 22–32)
Calcium: 8.9 mg/dL (ref 8.9–10.3)
Chloride: 104 mmol/L (ref 98–111)
Creatinine, Ser: 1.4 mg/dL — ABNORMAL HIGH (ref 0.61–1.24)
GFR, Estimated: 60 mL/min — ABNORMAL LOW (ref >60)
Glucose, Bld: 141 mg/dL — ABNORMAL HIGH (ref 70–99)
Potassium: 4.3 mmol/L (ref 3.5–5.1)
Sodium: 135 mmol/L (ref 135–145)
Total Bilirubin: 0.6 mg/dL (ref 0.3–1.2)
Total Protein: 7.1 g/dL (ref 6.5–8.1)

## 2020-11-24 LAB — GLUCOSE, CAPILLARY
Glucose-Capillary: 130 mg/dL — ABNORMAL HIGH (ref 70–99)
Glucose-Capillary: 139 mg/dL — ABNORMAL HIGH (ref 70–99)
Glucose-Capillary: 131 mg/dL — ABNORMAL HIGH (ref 70–99)
Glucose-Capillary: 109 mg/dL — ABNORMAL HIGH (ref 70–99)

## 2020-11-24 LAB — CBC WITH DIFFERENTIAL/PLATELET
Abs Immature Granulocytes: 0.12 10*3/uL — ABNORMAL HIGH (ref 0.00–0.07)
Basophils Absolute: 0 10*3/uL (ref 0.0–0.1)
Basophils Relative: 0 %
Eosinophils Absolute: 0.2 10*3/uL (ref 0.0–0.5)
Eosinophils Relative: 2 %
HCT: 33.7 % — ABNORMAL LOW (ref 39.0–52.0)
Hemoglobin: 11.1 g/dL — ABNORMAL LOW (ref 13.0–17.0)
Immature Granulocytes: 1 %
Lymphocytes Relative: 18 %
Lymphs Abs: 1.5 10*3/uL (ref 0.7–4.0)
MCH: 25.8 pg — ABNORMAL LOW (ref 26.0–34.0)
MCHC: 32.9 g/dL (ref 30.0–36.0)
MCV: 78.4 fL — ABNORMAL LOW (ref 80.0–100.0)
Monocytes Absolute: 0.8 10*3/uL (ref 0.1–1.0)
Monocytes Relative: 9 %
Neutro Abs: 5.9 10*3/uL (ref 1.7–7.7)
Neutrophils Relative %: 70 %
Platelets: 507 10*3/uL — ABNORMAL HIGH (ref 150–400)
RBC: 4.3 MIL/uL (ref 4.22–5.81)
RDW: 15.6 % — ABNORMAL HIGH (ref 11.5–15.5)
WBC: 8.5 10*3/uL (ref 4.0–10.5)
nRBC: 0 % (ref 0.0–0.2)

## 2020-11-24 MED ORDER — PREDNISONE 20 MG PO TABS
30.0000 mg | ORAL_TABLET | Freq: Every day | ORAL | Status: DC
  Administered 2020-11-25: 30 mg via ORAL
  Filled 2020-11-24: qty 1

## 2020-11-24 NOTE — Evaluation (Signed)
Occupational Therapy Assessment and Plan  Patient Details  Name: [name] MRN: [id number] Date of Birth: 09-15-1965  OT Diagnosis: abnormal posture, disturbance of vision, hemiplegia affecting non-dominant side, muscular wasting and disuse atrophy, muscle weakness (generalized), and pain in joint Rehab Potential: Rehab Potential (ACUTE ONLY): Fair ELOS: 2.5 weeks   Today's Date: 11/24/2020 OT Individual Time: 1102-1200 OT Individual Time Calculation (min): 58 min     Hospital Problem: Principal Problem:   Debility   Past Medical History:  Past Medical History:  Diagnosis Date   Colon cancer ([place]) 2009   rectal   Diabetes mellitus without complication ([place])    Gait abnormality 03/31/2018   Hemiparesis and speech and language deficit as late effects of stroke ([place]) 03/31/2018   Hypertension    Seizure ([place])    Stroke ([place]) 2018   Past Surgical History:  Past Surgical History:  Procedure Laterality Date   APPENDECTOMY     BIOPSY  11/18/2020   Procedure: BIOPSY;  Surgeon: Sharyn Creamer, MD;  Location: [place];  Service: Gastroenterology;;   COLONOSCOPY N/A 11/18/2020   Procedure: COLONOSCOPY;  Surgeon: Sharyn Creamer, MD;  Location: [place];  Service: Gastroenterology;  Laterality: N/A;   ESOPHAGOGASTRODUODENOSCOPY (EGD) WITH PROPOFOL N/A 11/18/2020   Procedure: ESOPHAGOGASTRODUODENOSCOPY (EGD) WITH PROPOFOL;  Surgeon: Sharyn Creamer, MD;  Location: [place];  Service: Gastroenterology;  Laterality: N/A;   JOINT REPLACEMENT Left 2004   Left hip   POLYPECTOMY  11/18/2020   Procedure: POLYPECTOMY;  Surgeon: Sharyn Creamer, MD;  Location: [facility] ENDOSCOPY;  Service: Gastroenterology;;   TOTAL HIP ARTHROPLASTY      Assessment & Plan Clinical Impression:  [name] is a 55 year old male with history of HTN, CVA with left hemiplegia, T2DM, seizure disorder, colon cancer, recent gout flare who was admitted on 11/14/20 /with complaints of left-sided and fall  while trying to transfer out of his wheelchair.  He was found to have A. fib with RVR and was started on Cardizem drip for rate control and converted to NSR.  CT chest/abdomen/pelvis was negative for acute abnormality and cholelithiasis without inflammation.  2D echo showed EF 60 to 65% with mild concentric LVH and mild biatrial dilatation. He was started IVF for AKI with SCr- 2.9. Dr. Doylene Canard consulted for input and Coreg added for rate control with recommendations on initiating anticoagulation.     He was started on IV heparin but  developed acute blood loss anemia with drop in hemoglobin from 11.3-9.5.  EGD/colonoscopy ordered for work-up but patient developed nausea vomiting with recurrent PAF and heart rates from 40-160. KUB done for work-up and revealed abdominal distention and showed marked gaseous distention of stomach with air in nondilated large and small bowel to the level of sigmoid. Patient had recurrent A. fib with RVR requiring IV amiodarone and was transitioned to po by 11/05.    UGI/Colonoscopy by Dr. Lorenso Courier on 11/06 revealed many linear esophageal ulcers without bleeding or stigmata of recent bleeding, erythematous mucosa in gastric antrum, multiple ulcers at hepatic flexure of ascending colon and cecum, 4 mm polyps, nonbleeding internal hemorrhoids..  She felt that anemia was likely due to esophageal or colonic ulcers which were felt to be ischemic in nature and.  PPI recommended for treatment of esophageal ulcers. Biopsies taken and revealed chronic gastritis, negative for H. pylori, mild chronic inflammation of esophagus, colonic mucosa with healed ulcer and tubular adenoma on rectal biopsy. To postpone DOAC for 8 weeks pending repeat EGD. Cholchicine  was added on 11/06 due ongoing pain from polyarticular gout. He has had issues with tachypnea and hypotension in the past 24 hours.  Therapy ongoing and patient noted to debilitated with decline in mobility and ability to carry out ADLs.  At  baseline, patient was  independent for squat pivot transfers,used WC for mobility and was able to complete ADLs with AE/assist. CIR recommended due to functional decline. Patient transferred to CIR on 11/23/2020 .    Patient currently requires min to max with basic self-care skills secondary to muscle weakness, muscle joint tightness, and muscle paralysis and decreased attention to left and decreased motor planning.  Prior to hospitalization, patient could complete ADLs at wc level with Min A and functional transfers with Mod I.  Patient will benefit from skilled intervention to decrease level of assist with basic self-care skills and increase independence with basic self-care skills prior to discharge home with care partner.  Anticipate patient will require 24 hour supervision and intermittent supervision and follow up home health.      OT Evaluation Precautions/Restrictions  Precautions Precautions: Fall;Other (comment) Precaution Comments: H/o CVA with residual L hemiparesis; dysarthric bowel incontinence Required Braces or Orthoses:  (L resting hand spint at baseline (variable schedule)) Restrictions Weight Bearing Restrictions: No General Chart Reviewed: Yes Additional Pertinent History: HTN, CVA with left hemiplegia, T2DM, seizure disorder, colon cancer and GOUT Family/Caregiver Present: No Vital Signs  Pain Pain Assessment Pain Scale: 0-10 Pain Score: 0-No pain Home Living/Prior Functioning Home Living Family/patient expects to be discharged to:: Private residence Living Arrangements: Spouse/significant other, Children Available Help at Discharge: Family, Available 24 hours/day, Other (Comment) (Wife works during the day but will be home at time of d/c) Type of Home: House Home Access: Stairs to enter (Wife bumps patient backward into home in wc) Technical brewer of Steps: 1 Entrance Stairs-Rails: None Home Layout: One level Bathroom Shower/Tub: Scientist, forensic: Standard (Uses BSC at bedside) Additional Comments: Lives with wife and 71 y.o. daughter  Lives With: Spouse, Daughter IADL History Leisure and Hobbies: Enjoys cooking Prior Function Level of Independence: Independent with transfers, Needs assistance with ADLs, Needs assistance with homemaking Bath: Moderate Laundry: Total Light Housekeeping: Total Shopping: Total  Able to Take Stairs?: No Driving: No Vision Baseline Vision/History: 1 Wears glasses Patient Visual Report: No change from baseline Perception  Perception: Within Functional Limits Praxis Praxis: Intact Cognition Overall Cognitive Status: Within Functional Limits for tasks assessed Arousal/Alertness: Awake/alert Orientation Level: Person;Place;Situation Person: Oriented Place: Oriented Situation: Oriented Year: 2022 Month: November Day of Week: Correct Immediate Memory Recall: Sock;Blue;Bed Memory Recall Sock: With Cue Memory Recall Blue: With Cue Memory Recall Bed: With Cue Sensation Sensation Light Touch: Appears Intact Coordination Gross Motor Movements are Fluid and Coordinated: No Fine Motor Movements are Fluid and Coordinated: No Coordination and Movement Description: WFL on R; deficits at baseline on L 2/2 Hx of CVA Finger Nose Finger Test: Intact on R; impaired at baseline on L Heel Shin Test: decreased performance, but able to attempt performance. Motor  Motor Motor: Hemiplegia Motor - Skilled Clinical Observations: L hemi at baseline 2/2 Hx of CVA ~2018  Trunk/Postural Assessment  Cervical Assessment Cervical Assessment: Within Functional Limits Lumbar Assessment Lumbar Assessment: Within Functional Limits Postural Control Postural Control: Within Functional Limits  Balance Balance Balance Assessed: Yes Dynamic Sitting Balance Sitting balance - Comments: Able to doff footwear seated EOB. Min guard for safety, no LOB with reaching forward/down outside of  BOS. Extremity/Trunk Assessment RUE Assessment RUE  Assessment: Within Functional Limits LUE Assessment LUE Assessment: Exceptions to [facility] LUE Body System: Neuro Brunstrum levels for arm and hand: Arm;Hand Brunstrum level for arm: Stage III Synergy is performed voluntarily Brunstrum level for hand: Stage II Synergy is developing  Care Tool Care Tool Self Care Eating   Eating Assist Level: Set up assist    Oral Care  Oral care, brush teeth, clean dentures activity did not occur: Refused      Bathing   Body parts bathed by patient: Left arm;Chest;Abdomen;Front perineal area;Right upper leg;Left upper leg;Face Body parts bathed by helper: Right arm;Buttocks;Right lower leg;Left lower leg   Assist Level: Moderate Assistance - Patient 50 - 74%    Upper Body Dressing(including orthotics)   What is the patient wearing?: Pull over shirt   Assist Level: Minimal Assistance - Patient > 75%    Lower Body Dressing (excluding footwear)   What is the patient wearing?: Pants;Underwear/pull up Assist for lower body dressing: Maximal Assistance - Patient 25 - 49%    Putting on/Taking off footwear   What is the patient wearing?: Socks;Shoes Assist for footwear: Maximal Assistance - Patient 25 - 49%       Care Tool Toileting Toileting activity Toileting Activity did not occur (Clothing management and hygiene only): N/A (no void or bm)       Care Tool Bed Mobility Roll left and right activity   Roll left and right assist level: Minimal Assistance - Patient > 75%    Sit to lying activity   Sit to lying assist level: Moderate Assistance - Patient 50 - 74%    Lying to sitting on side of bed activity   Lying to sitting on side of bed assist level: the ability to move from lying on the back to sitting on the side of the bed with no back support.: Moderate Assistance - Patient 50 - 74%     Care Tool Transfers Sit to stand transfer   Sit to stand assist level: Total Assistance - Patient <  25% (Stedy.)    Chair/bed transfer   Chair/bed transfer assist level: Total Assistance - Patient < 25% Charlaine Dalton)     Toilet transfer   Assist Level: Moderate Assistance - Patient 50 - 74%     Care Tool Cognition  Expression of Ideas and Wants Expression of Ideas and Wants: 4. Without difficulty (complex and basic) - expresses complex messages without difficulty and with speech that is clear and easy to understand  Understanding Verbal and Non-Verbal Content Understanding Verbal and Non-Verbal Content: 4. Understands (complex and basic) - clear comprehension without cues or repetitions   Memory/Recall Ability Memory/Recall Ability : Current season;Location of own room;Staff names and faces;That he or she is in a hospital/hospital unit   Refer to Care Plan for Junior 1 OT Short Term Goal 1 (Week 1): Patient will don UB clothing with set-up assist. OT Short Term Goal 2 (Week 1): Patient will complete 2/3 parts of toileting task with Min A and lateral leans. OT Short Term Goal 3 (Week 1): Patient will complete squat-pivot transfers with Min A.  Recommendations for other services: Other: TBD    Skilled Therapeutic Intervention Patient met seated in wc in agreement with OT treatment session. 0/10 pain reported at rest and with activity. Patient does report recent gout in R knee but reports no pain at time of OT treatment session. Patient completed UB bathing/dressing with Min A and LB bathing/dressing with Mod  to Max A. Max A squat-pivot transfer to EOB. Return to supine with assist to advance LLE from EOB to bed surface and for controlled descent of trunk. Patient would benefit from CIR level therapies to maximize safety/independence with ADLs/ADL transfers and wc mobility in prep for safe return to PLOF. Session concluded with patient lying supine in bed with call bell within reach, bed alarm activated and all needs met.   ADL ADL Eating: Set up Where  Assessed-Eating: Chair Grooming: Not assessed Upper Body Bathing: Minimal assistance Where Assessed-Upper Body Bathing: Chair Lower Body Bathing: Moderate assistance Where Assessed-Lower Body Bathing: Other (Comment) (Stedy) Upper Body Dressing: Minimal assistance Where Assessed-Upper Body Dressing: Chair Lower Body Dressing: Maximal assistance Where Assessed-Lower Body Dressing: Chair Toileting: Not assessed Toilet Transfer Method: Not assessed Tub/Shower Transfer: Not assessed Mobility  Bed Mobility Bed Mobility: Sitting - Scoot to Edge of Bed;Sit to Sidelying Right Rolling Left: Moderate Assistance - Patient 50-74% (use of side rail.) Left Sidelying to Sit: Moderate Assistance - Patient 50-74% (2/2 pain, pt states was able to perform pre-morbid.) Sitting - Scoot to Edge of Bed: Moderate Assistance - Patient 50-74% Sit to Sidelying Right: Minimal Assistance - Patient > 75%   Discharge Criteria: Patient will be discharged from OT if patient refuses treatment 3 consecutive times without medical reason, if treatment goals not met, if there is a change in medical status, if patient makes no progress towards goals or if patient is discharged from hospital.  The above assessment, treatment plan, treatment alternatives and goals were discussed and mutually agreed upon: by patient  Sugey Trevathan R Howerton-Davis 11/24/2020, 11:26 AM

## 2020-11-24 NOTE — Progress Notes (Signed)
PROGRESS NOTE   Subjective/Complaints:  Pt c/o R knee gout- more painful- real bothersome.  Has DM-   ROS:  Pt denies SOB, abd pain, CP, N/V/C/D, and vision changes    Objective:   No results found. Recent Labs    11/24/20 0519  WBC 8.5  HGB 11.1*  HCT 33.7*  PLT 507*   Recent Labs    11/23/20 0110 11/24/20 0519  NA 136 135  K 4.0 4.3  CL 106 104  CO2 21* 20*  GLUCOSE 129* 141*  BUN 21* 21*  CREATININE 1.31* 1.40*  CALCIUM 8.4* 8.9    Intake/Output Summary (Last 24 hours) at 11/24/2020 1211 Last data filed at 11/24/2020 0830 Gross per 24 hour  Intake 220 ml  Output --  Net 220 ml     Pressure Injury 11/23/20 Coccyx Bilateral Stage 2 -  Partial thickness loss of dermis presenting as a shallow open injury with a red, pink wound bed without slough. (Active)  11/23/20 1837  Location: Coccyx  Location Orientation: Bilateral  Staging: Stage 2 -  Partial thickness loss of dermis presenting as a shallow open injury with a red, pink wound bed without slough.  Wound Description (Comments):   Present on Admission: Yes    Physical Exam: Vital Signs Blood pressure (!) 129/93, pulse 97, temperature 98.9 F (37.2 C), temperature source Oral, resp. rate 16, height 5' 2.99" (1.6 m), weight 81.5 kg, SpO2 94 %.     General: awake, alert, appropriate, halting speech; dysarthric; NAD HENT: dysconjugate gaze; oropharynx moist CV: regular rate; no JVD Pulmonary: CTA B/L; no W/R/R- good air movement GI: soft, NT, ND, (+)BS Psychiatric: appropriate Neurological: alert- dysarthric speech Musculoskeletal:        General: Swelling (right knee with associated warmth and pain with palpation and PROM) present. Warm, slightly erythematous- very painful; somewhat swollen    Cervical back: Normal range of motion.  Skin:    General: Skin is warm.  Neurological:     Mental Status: He is alert and oriented to person,  place, and time.     Comments: Dysarthric and measured speech. Left central 7 and tongue deviation. Reasonable insight and awareness. Functional memory. Normal language. LUE 1+ to 2/5 prox to distal. MAS 2 wrist/finger flexors and biceps. LLE 3-/5 prox to 3/5 distally without resting tone. Sensed pain and light touch in all 4's    Assessment/Plan: 1. Functional deficits which require 3+ hours per day of interdisciplinary therapy in a comprehensive inpatient rehab setting. Physiatrist is providing close team supervision and 24 hour management of active medical problems listed below. Physiatrist and rehab team continue to assess barriers to discharge/monitor patient progress toward functional and medical goals  Care Tool:  Bathing              Bathing assist       Upper Body Dressing/Undressing Upper body dressing        Upper body assist      Lower Body Dressing/Undressing Lower body dressing            Lower body assist       Toileting Toileting    Toileting assist  Transfers Chair/bed transfer  Transfers assist           Locomotion Ambulation   Ambulation assist              Walk 10 feet activity   Assist           Walk 50 feet activity   Assist           Walk 150 feet activity   Assist           Walk 10 feet on uneven surface  activity   Assist           Wheelchair     Assist               Wheelchair 50 feet with 2 turns activity    Assist            Wheelchair 150 feet activity     Assist          Blood pressure (!) 129/93, pulse 97, temperature 98.9 F (37.2 C), temperature source Oral, resp. rate 16, height 5' 2.99" (1.6 m), weight 81.5 kg, SpO2 94 %.  Medical Problem List and Plan: 1.  Functional deficits secondary to debility related to new onset a fib and multiple medical             -patient may shower             -ELOS/Goals: 12-14 days, min assist at w/c  level  First day of evaluation PT, OT and SLP- con't therapies 2.  Antithrombotics: -DVT/anticoagulation:  Mechanical: Sequential compression devices, below knee Bilateral lower extremities             -antiplatelet therapy: N/a 3. Pain Management/spasticity: tylenol # 3 prn for pain.              -pt followed by [facility], has had botox in past. Looks as if these are needed again to wrist and finger flexors 4. Mood: LCSW to follow for evaluation and support.              -antipsychotic agents: N/A 5. Neuropsych: This patient is capable of making decisions on his own behalf. 6. Skin/Wound Care: Routine pressure relief Mesures.              --add beneprotein.  7. Fluids/Electrolytes/Nutrition: strict I/O. Check lytes in am.              --Monitor renal status with serial checks.  8. A fib new onset: Monitor HR TID and for symptoms with increase in activity.             --continue Coreg, Cardizem and amiodarone             --Plans for repeat EGD in 8 weeks for clearance on addition of DOAC 9. Gout flare: Continue Colchicine daily for now. Pt says it's improving.  11/12- pt says much worse today- will try Prednisone 30 mg daily x 4 days- no NSAIDs.  10. Acute on chronic AKI?: Due to NSAIDS for gout flare. Has resolved with SCr -1.3? Baseline .             --Recheck CMET in am and avoid nephrotoxic meds 11. T2DM: Hgb A1c-6.7. Monitor BS ac/hs and use SSI for elevated BS.  11/12- might go up due to Prednisone- will monitor- only 4 days             --Add CM restrictions. Continue to hold metformin.  12. Chronic diastolic  CHF: Monitor I/O. Check daily weights.  --Will monitor for signs of overload. On Lipitor.              --Ace on hold due to recent AKI.  13. Multiple gastric/colonic ulcers: On PPI BID.             --monitor for signs of bleeding.              --Anemia stable. -check hgb on admit   11/12- HB 11.1- will con't to monitor    LOS: 1 days A FACE TO FACE EVALUATION WAS  PERFORMED  Jaquise Faux 11/24/2020, 12:11 PM

## 2020-11-24 NOTE — Evaluation (Signed)
Physical Therapy Assessment and Plan  Patient Details  Name: [name] MRN: [id number] Date of Birth: 08-Dec-1965  PT Diagnosis: Abnormal posture, Hemiparesis non-dominant, and Muscle weakness Rehab Potential: Good ELOS: 2-3 weeks   Today's Date: 11/24/2020 PT Individual Time: 1400-1500 and 800-900 PT Individual Time Calculation (min): 60 min  and 60 min   Hospital Problem: Principal Problem:   Debility   Past Medical History:  Past Medical History:  Diagnosis Date   Colon cancer ([place]) 2009   rectal   Diabetes mellitus without complication ([place])    Gait abnormality 03/31/2018   Hemiparesis and speech and language deficit as late effects of stroke ([place]) 03/31/2018   Hypertension    Seizure ([place])    Stroke ([place]) 2018   Past Surgical History:  Past Surgical History:  Procedure Laterality Date   APPENDECTOMY     BIOPSY  11/18/2020   Procedure: BIOPSY;  Surgeon: Sharyn Creamer, MD;  Location: [facility] ENDOSCOPY;  Service: Gastroenterology;;   COLONOSCOPY N/A 11/18/2020   Procedure: COLONOSCOPY;  Surgeon: Sharyn Creamer, MD;  Location: [facility];  Service: Gastroenterology;  Laterality: N/A;   ESOPHAGOGASTRODUODENOSCOPY (EGD) WITH PROPOFOL N/A 11/18/2020   Procedure: ESOPHAGOGASTRODUODENOSCOPY (EGD) WITH PROPOFOL;  Surgeon: Sharyn Creamer, MD;  Location: [facility];  Service: Gastroenterology;  Laterality: N/A;   JOINT REPLACEMENT Left 2004   Left hip   POLYPECTOMY  11/18/2020   Procedure: POLYPECTOMY;  Surgeon: Sharyn Creamer, MD;  Location: [facility] ENDOSCOPY;  Service: Gastroenterology;;   TOTAL HIP ARTHROPLASTY      Assessment & Plan Clinical Impression: [name] is a 55 year old male with history of HTN, CVA with left hemiplegia, T2DM, seizure disorder, colon cancer, recent gout flare who was admitted on 11/14/20 /with complaints of left-sided and fall while trying to transfer out of his wheelchair.  He was found to have A. fib with RVR and was started on Cardizem  drip for rate control and converted to NSR.  CT chest/abdomen/pelvis was negative for acute abnormality and cholelithiasis without inflammation.  2D echo showed EF 60 to 65% with mild concentric LVH and mild biatrial dilatation. He was started IVF for AKI with SCr- 2.9. Dr. Doylene Canard consulted for input and Coreg added for rate control with recommendations on initiating anticoagulation.     He was started on IV heparin but  developed acute blood loss anemia with drop in hemoglobin from 11.3-9.5.  EGD/colonoscopy ordered for work-up but patient developed nausea vomiting with recurrent PAF and heart rates from 40-160. KUB done for work-up and revealed abdominal distention and showed marked gaseous distention of stomach with air in nondilated large and small bowel to the level of sigmoid. Patient had recurrent A. fib with RVR requiring IV amiodarone and was transitioned to po by 11/05.    UGI/Colonoscopy by Dr. Lorenso Courier on 11/06 revealed many linear esophageal ulcers without bleeding or stigmata of recent bleeding, erythematous mucosa in gastric antrum, multiple ulcers at hepatic flexure of ascending colon and cecum, 4 mm polyps, nonbleeding internal hemorrhoids..  She felt that anemia was likely due to esophageal or colonic ulcers which were felt to be ischemic in nature and.  PPI recommended for treatment of esophageal ulcers. Biopsies taken and revealed chronic gastritis, negative for H. pylori, mild chronic inflammation of esophagus, colonic mucosa with healed ulcer and tubular adenoma on rectal biopsy. To postpone DOAC for 8 weeks pending repeat EGD. Cholchicine was added on 11/06 due ongoing pain from polyarticular gout. He has had issues with  tachypnea and hypotension in the past 24 hours.  Therapy ongoing and patient noted to debilitated with decline in mobility and ability to carry out ADLs.  At baseline, patient was  independent for squat pivot transfers,used WC for mobility and was able to complete ADLs with  AE/assist. CIR recommended due to functional decline.   Patient currently requires max with mobility secondary to muscle weakness and decreased coordination and decreased motor planning.  Prior to hospitalization, patient was supervision with mobility and lived with Spouse, Daughter in a House home.  Home access is 1Stairs to enter.  Patient will benefit from skilled PT intervention to maximize safe functional mobility, minimize fall risk, and decrease caregiver burden for planned discharge home with intermittent assist.  Anticipate patient will benefit from follow up [facility] at discharge.  PT - End of Session Endurance Deficit: Yes PT Assessment Rehab Potential (ACUTE/IP ONLY): Good PT Barriers to Discharge: Other (comments) PT Barriers to Discharge Comments: decreased strength and endurance. PT Patient demonstrates impairments in the following area(s): Balance;Safety;Endurance;Motor PT Transfers Functional Problem(s): Bed Mobility;Bed to Chair;Car PT Locomotion Functional Problem(s): Wheelchair Mobility PT Plan PT Intensity: Minimum of 1-2 x/day ,45 to 90 minutes PT Duration Estimated Length of Stay: 2-3 weeks PT Treatment/Interventions: Community reintegration;Neuromuscular re-education;Therapeutic Activities;UE/LE Coordination activities;Wheelchair propulsion/positioning;Functional mobility training;Therapeutic Exercise PT Transfers Anticipated Outcome(s): supervision PT Locomotion Anticipated Outcome(s): w/c mobility w/ supervision. PT Recommendation Follow Up Recommendations: Home health PT Patient destination: Home Equipment Recommended: To be determined   PT Evaluation Precautions/Restrictions Precautions Precautions: Fall Precaution Comments: H/o CVA with residual L hemiparesis; dysarthric bowel incontinence Required Braces or Orthoses:  (L resting hand spint at baseline (variable schedule)) Restrictions Weight Bearing Restrictions: No General Chart Reviewed:  Yes Family/Caregiver Present: No Vital Signs Pain Pain Assessment Pain Scale: 0-10 Pain Score: 4  Pain Type: Acute pain Pain Location: Rib cage Pain Orientation: Left Pain Descriptors / Indicators: Aching Pain Onset: With Activity Pain Interference Pain Interference Pain Effect on Sleep: 1. Rarely or not at all Pain Interference with Therapy Activities: 1. Rarely or not at all Pain Interference with Day-to-Day Activities: 1. Rarely or not at all Home Living/Prior [place] Available Help at Discharge: Family;Available 24 hours/day;Other (Comment) Type of Home: House Home Access: Stairs to enter Entrance Stairs-Number of Steps: 1 Entrance Stairs-Rails: None Home Layout: One level Bathroom Shower/Tub: Chiropodist: Standard (Uses BSC at bedside) Additional Comments: Lives with wife and 63 y.o. daughter  Lives With: Spouse;Daughter Prior Function Level of Independence: Independent with transfers;Needs assistance with ADLs;Needs assistance with homemaking Bath: Moderate Laundry: Total Light Housekeeping: Total Shopping: Total  Able to Take Stairs?: No Driving: No Vision/Perception  Vision - History Ability to See in Adequate Light: 0 Adequate Perception Perception: Within Functional Limits Praxis Praxis: Intact  Cognition Overall Cognitive Status: Within Functional Limits for tasks assessed Arousal/Alertness: Awake/alert Orientation Level: Oriented X4 Year: 2022 Month: November Day of Week: Correct Immediate Memory Recall: Sock;Blue;Bed Memory Recall Sock: With Cue Memory Recall Blue: With Cue Memory Recall Bed: With Cue Sensation Sensation Light Touch: Appears Intact Coordination Gross Motor Movements are Fluid and Coordinated: No Fine Motor Movements are Fluid and Coordinated: No Coordination and Movement Description: WFL on R; deficits at baseline on L 2/2 Hx of CVA Finger Nose Finger Test: Intact on R; impaired at baseline  on L Heel Shin Test: decreased performance, but able to attempt performance. Motor  Motor Motor: Hemiplegia Motor - Skilled Clinical Observations: L hemi at baseline 2/2 Hx of CVA ~2018  Trunk/Postural Assessment  Cervical Assessment Cervical Assessment: Within Functional Limits Lumbar Assessment Lumbar Assessment: Within Functional Limits Postural Control Postural Control: Within Functional Limits  Balance Balance Balance Assessed: Yes Dynamic Sitting Balance Sitting balance - Comments: Able to doff footwear seated EOB. Min guard for safety, no LOB with reaching forward/down outside of BOS. Extremity Assessment  RUE Assessment RUE Assessment: Within Functional Limits LUE Assessment LUE Assessment: Exceptions to [facility] LUE Body System: Neuro Brunstrum levels for arm and hand: Arm;Hand Brunstrum level for arm: Stage III Synergy is performed voluntarily Brunstrum level for hand: Stage II Synergy is developing RLE Assessment RLE Assessment: Within Functional Limits Passive Range of Motion (PROM) Comments: noted knee flexion contracture. General Strength Comments: grossly 4-/5 LLE Assessment General Strength Comments: grossly 3+/5  Care Tool Care Tool Bed Mobility Roll left and right activity   Roll left and right assist level: Minimal Assistance - Patient > 75%    Sit to lying activity   Sit to lying assist level: Moderate Assistance - Patient 50 - 74%    Lying to sitting on side of bed activity   Lying to sitting on side of bed assist level: the ability to move from lying on the back to sitting on the side of the bed with no back support.: Moderate Assistance - Patient 50 - 74%     Care Tool Transfers Sit to stand transfer   Sit to stand assist level: Total Assistance - Patient < 25% (Stedy.)    Chair/bed transfer   Chair/bed transfer assist level: Maximal Assistance - Patient 25 - 49%     Toilet transfer   Assist Level: Moderate Assistance - Patient 50 - 74%     Car transfer Car transfer activity did not occur: Safety/medical concerns        Care Tool Locomotion Ambulation Ambulation activity did not occur: Safety/medical concerns        Walk 10 feet activity Walk 10 feet activity did not occur: Safety/medical concerns       Walk 50 feet with 2 turns activity Walk 50 feet with 2 turns activity did not occur: Safety/medical concerns      Walk 150 feet activity Walk 150 feet activity did not occur: Safety/medical concerns      Walk 10 feet on uneven surfaces activity Walk 10 feet on uneven surfaces activity did not occur: Safety/medical concerns      Stairs Stair activity did not occur: N/A        Walk up/down 1 step activity Walk up/down 1 step or curb (drop down) activity did not occur: N/A      Walk up/down 4 steps activity Walk up/down 4 steps activity did not occur: N/A      Walk up/down 12 steps activity Walk up/down 12 steps activity did not occur: N/A      Pick up small objects from floor Pick up small object from the floor (from standing position) activity did not occur: Safety/medical concerns      Wheelchair Is the patient using a wheelchair?: Yes Type of Wheelchair: Manual   Wheelchair assist level: Supervision/Verbal cueing Max wheelchair distance: 70  Wheel 50 feet with 2 turns activity   Assist Level: Supervision/Verbal cueing  Wheel 150 feet activity   Assist Level: Maximal Assistance - Patient 25 - 49%    Refer to Care Plan for Long Term Goals  SHORT TERM GOAL WEEK 1 PT Short Term Goal 1 (Week 1): Pt will transfer sidelying to sitting w/ min A  from flat bed. PT Short Term Goal 2 (Week 1): Pt will transfer sit to stand w/ mod A. PT Short Term Goal 3 (Week 1): Pt will transfer bed <> w/c w/o lifting equipment. PT Short Term Goal 4 (Week 1): Pt will negotiate w/c w/ CGA 75'  Recommendations for other services: None   Skilled Therapeutic Intervention Evaluation completed (see details above and below)  with education on PT POC and goals and individual treatment initiated with focus on  transfers, standing balance, strengthening, endurance.  Pt agrees to participate w/ evaluation and treatment.  Pt requires assist to thread pants over feet and bring to knees, pt able to pull up to hips but unable to bridge.  Pt states at home he then stands at side of bed and pulls up pants over hips.  Pt requires mod A for rolling to left side using side rail and assist w/ bringing hips over.  Pt requires mod A for left sidelying to sit 2/2 pain in left side/ribs from fall.  Pt sat EOB and required min A for pulling overhead shirt over.  Pt performed sit to stand at [place], although using RUE almost exclusively.  Pt unable to staighten completely and PT finished pulling pants over hips.  Pt then transferred to w/c and wheeled self approx. 40' using LEs and R UE w/ min A for turn.  Pt remained sitting in w/c and able to wheel self in room w/ chair alarm on and all needs in reach.  Pt states that w/c too high.  Will lower in next session.  RN and NT notified of transfers w/ Stedy at this time and ability to roll around room.  Second session:  Pt presents supine in bed and agreeable to therapy.  Pt states w/c cushion uncomfortable.  New cushion brought and w/c lowered for height.  Pt performed LE there ex including HS, abd/add, AAROM to L LE.  Pt performed sidelying to sit transfer w/ mod A but improved performance w/ cueing for sequencing and simultaneous movement.  Pt performed squat pivot transfer bed > w/c w/ max A but improved WB through LEs.  Pt wheeled self 30' into hallway w/ improved ability to reach floor w/ feet.  Pt performed sit to stand x 2 at railing in hall w/ max A and improved posture.  Pt returned to room and remained sitting in w/c w/ chair alarm on and all needs in reach, nursing notified.     Mobility Bed Mobility Bed Mobility: Sitting - Scoot to Edge of Bed;Sit to Sidelying Right Rolling  Left: Moderate Assistance - Patient 50-74% (use of side rail.) Left Sidelying to Sit: Moderate Assistance - Patient 50-74% (2/2 pain, pt states was able to perform pre-morbid.) Sitting - Scoot to Edge of Bed: Moderate Assistance - Patient 50-74% Sit to Sidelying Right: Minimal Assistance - Patient > 75% Transfers Transfers: Transfer via Stage manager: Animal nutritionist: No Gait Gait: No Architect: Yes Wheelchair Assistance: Minimal assistance - Patient >75% Wheelchair Propulsion: Right upper extremity;Both lower extermities Wheelchair Parts Management: Needs assistance Distance: 40   Discharge Criteria: Patient will be discharged from PT if patient refuses treatment 3 consecutive times without medical reason, if treatment goals not met, if there is a change in medical status, if patient makes no progress towards goals or if patient is discharged from hospital.  The above assessment, treatment plan, treatment alternatives and goals were discussed and mutually  agreed upon: by patient  [name] 11/24/2020, 3:08 PM

## 2020-11-25 DIAGNOSIS — M10261 Drug-induced gout, right knee: Secondary | ICD-10-CM

## 2020-11-25 DIAGNOSIS — E669 Obesity, unspecified: Secondary | ICD-10-CM

## 2020-11-25 DIAGNOSIS — N289 Disorder of kidney and ureter, unspecified: Secondary | ICD-10-CM

## 2020-11-25 DIAGNOSIS — N189 Chronic kidney disease, unspecified: Secondary | ICD-10-CM

## 2020-11-25 DIAGNOSIS — E1169 Type 2 diabetes mellitus with other specified complication: Secondary | ICD-10-CM

## 2020-11-25 LAB — GLUCOSE, CAPILLARY
Glucose-Capillary: 134 mg/dL — ABNORMAL HIGH (ref 70–99)
Glucose-Capillary: 144 mg/dL — ABNORMAL HIGH (ref 70–99)
Glucose-Capillary: 141 mg/dL — ABNORMAL HIGH (ref 70–99)
Glucose-Capillary: 112 mg/dL — ABNORMAL HIGH (ref 70–99)

## 2020-11-25 MED ORDER — PREDNISONE 20 MG PO TABS
20.0000 mg | ORAL_TABLET | Freq: Every day | ORAL | Status: AC
  Administered 2020-11-26 – 2020-11-27 (×2): 20 mg via ORAL
  Filled 2020-11-25 (×2): qty 1

## 2020-11-25 NOTE — Progress Notes (Signed)
PROGRESS NOTE   Subjective/Complaints:  Pt says right knee feeling better today (was hurting yesterday). Happy how therapy went yesterday. No complaints this morning  ROS: Patient denies fever, rash, sore throat, blurred vision, nausea, vomiting, diarrhea, cough, shortness of breath or chest pain, joint or back pain, headache, or mood change.     Objective:   No results found. Recent Labs    11/24/20 0519  WBC 8.5  HGB 11.1*  HCT 33.7*  PLT 507*   Recent Labs    11/23/20 0110 11/24/20 0519  NA 136 135  K 4.0 4.3  CL 106 104  CO2 21* 20*  GLUCOSE 129* 141*  BUN 21* 21*  CREATININE 1.31* 1.40*  CALCIUM 8.4* 8.9    Intake/Output Summary (Last 24 hours) at 11/25/2020 0934 Last data filed at 11/24/2020 1853 Gross per 24 hour  Intake 480 ml  Output 400 ml  Net 80 ml     Pressure Injury 11/23/20 Coccyx Bilateral Stage 2 -  Partial thickness loss of dermis presenting as a shallow open injury with a red, pink wound bed without slough. (Active)  11/23/20 1837  Location: Coccyx  Location Orientation: Bilateral  Staging: Stage 2 -  Partial thickness loss of dermis presenting as a shallow open injury with a red, pink wound bed without slough.  Wound Description (Comments):   Present on Admission: Yes    Physical Exam: Vital Signs Blood pressure 97/64, pulse 80, temperature 98.1 F (36.7 C), temperature source Oral, resp. rate 18, height 5' 2.99" (1.6 m), weight 80.8 kg, SpO2 97 %.   Constitutional: No distress . Vital signs reviewed. HEENT: NCAT, EOMI, oral membranes moist Neck: supple Cardiovascular: RRR without murmur. No JVD    Respiratory/Chest: CTA Bilaterally without wheezes or rales. Normal effort    GI/Abdomen: BS +, non-tender, non-distended Ext: no clubbing, cyanosis, or edema Psych: pleasant and cooperative  Musculoskeletal:        General: Swelling (right knee with associated warmth and pain  with palpation and PROM-with some improvement)      Cervical back: Normal range of motion.  Skin:    General: Skin is warm.  Neurological:     Mental Status: He is alert and oriented to person, place, and time.    Comments: Dysarthric and measured speech. Left central 7 and tongue deviation. Reasonable insight and awareness. Functional memory. Normal language. LUE 1+ to 2/5 prox to distal. MAS 2 wrist/finger flexors and biceps. LLE 3-/5 prox to 3/5 distally without  tone. Sensed pain and light touch in all 4's    Assessment/Plan: 1. Functional deficits which require 3+ hours per day of interdisciplinary therapy in a comprehensive inpatient rehab setting. Physiatrist is providing close team supervision and 24 hour management of active medical problems listed below. Physiatrist and rehab team continue to assess barriers to discharge/monitor patient progress toward functional and medical goals  Care Tool:  Bathing    Body parts bathed by patient: Left arm, Chest, Abdomen, Front perineal area, Right upper leg, Left upper leg, Face   Body parts bathed by helper: Right arm, Buttocks, Right lower leg, Left lower leg     Bathing assist Assist Level: Moderate  Assistance - Patient 50 - 74%     Upper Body Dressing/Undressing Upper body dressing   What is the patient wearing?: Pull over shirt    Upper body assist Assist Level: Minimal Assistance - Patient > 75%    Lower Body Dressing/Undressing Lower body dressing      What is the patient wearing?: Pants, Underwear/pull up     Lower body assist Assist for lower body dressing: Maximal Assistance - Patient 25 - 49%     Toileting Toileting Toileting Activity did not occur (Clothing management and hygiene only): N/A (no void or bm)  Toileting assist Assist for toileting: Maximal Assistance - Patient 25 - 49%     Transfers Chair/bed transfer  Transfers assist     Chair/bed transfer assist level: Maximal Assistance - Patient 25 -  49%     Locomotion Ambulation   Ambulation assist   Ambulation activity did not occur: Safety/medical concerns          Walk 10 feet activity   Assist  Walk 10 feet activity did not occur: Safety/medical concerns        Walk 50 feet activity   Assist Walk 50 feet with 2 turns activity did not occur: Safety/medical concerns         Walk 150 feet activity   Assist Walk 150 feet activity did not occur: Safety/medical concerns         Walk 10 feet on uneven surface  activity   Assist Walk 10 feet on uneven surfaces activity did not occur: Safety/medical concerns         Wheelchair     Assist Is the patient using a wheelchair?: Yes Type of Wheelchair: Manual    Wheelchair assist level: Supervision/Verbal cueing Max wheelchair distance: 70    Wheelchair 50 feet with 2 turns activity    Assist        Assist Level: Supervision/Verbal cueing   Wheelchair 150 feet activity     Assist      Assist Level: Maximal Assistance - Patient 25 - 49%   Blood pressure 97/64, pulse 80, temperature 98.1 F (36.7 C), temperature source Oral, resp. rate 18, height 5' 2.99" (1.6 m), weight 80.8 kg, SpO2 97 %.  Medical Problem List and Plan: 1.  Functional deficits secondary to debility related to new onset a fib and multiple medical             -patient may shower             -ELOS/Goals: 12-14 days, min assist at w/c level  -Continue CIR therapies including PT, OT  2.  Antithrombotics: -DVT/anticoagulation:  Mechanical: Sequential compression devices, below knee Bilateral lower extremities             -antiplatelet therapy: N/a 3. Pain Management/spasticity: tylenol # 3 prn for pain.              -pt followed by [facility], has had botox in past. Looks as if these are needed again to wrist and finger flexors 4. Mood: LCSW to follow for evaluation and support.              -antipsychotic agents: N/A 5. Neuropsych: This patient is capable of making  decisions on his own behalf. 6. Skin/Wound Care: Routine pressure relief Mesures.              --added beneprotein.  7. Fluids/Electrolytes/Nutrition: strict I/O. Check lytes in am.              --  Monitor renal status with serial checks.  8. A fib new onset: Monitor HR TID and for symptoms with increase in activity.             --continue Coreg, Cardizem and amiodarone             --Plans for repeat EGD in 8 weeks for clearance on addition of DOAC 9. Gout flare: Continue Colchicine daily for now. Pt says it's improving.  11/12-flared-- Prednisone 30 mg daily x 4 days-    11/13 pt reports improvement today--wean down to 20mg  daily x 2 more days then stop 10. Acute on chronic AKI?: Due to NSAIDS for gout flare. Has resolved with SCr -1.3? Baseline .             -- 1.4 11/12--near baseline---continue to follow 11. T2DM: Hgb A1c-6.7. Monitor BS ac/hs and use SSI for elevated BS.  CBG (last 3)  Recent Labs    11/24/20 1654 11/24/20 2105 11/25/20 0538  GLUCAP 131* 109* 134*                 --Added CM restrictions. Continue to hold metformin 11/13 -cbg's not bad despite prednisone--monitor today.  12. Chronic diastolic CHF: Monitor I/O. Check daily weights.  --Will monitor for signs of overload. On Lipitor.              --Ace on hold due to recent AKI.  13. Multiple gastric/colonic ulcers: On PPI BID.             --monitor for signs of bleeding.              --Anemia stable. -11/12- HB 11.1- will con't to monitor    LOS: 2 days A FACE TO FACE EVALUATION WAS PERFORMED  Meredith Staggers 11/25/2020, 9:34 AM

## 2020-11-26 DIAGNOSIS — L8990 Pressure ulcer of unspecified site, unspecified stage: Secondary | ICD-10-CM | POA: Insufficient documentation

## 2020-11-26 HISTORY — DX: Pressure ulcer of unspecified site, unspecified stage: L89.90

## 2020-11-26 LAB — BASIC METABOLIC PANEL
Anion gap: 9 (ref 5–15)
BUN: 24 mg/dL — ABNORMAL HIGH (ref 6–20)
CO2: 21 mmol/L — ABNORMAL LOW (ref 22–32)
Calcium: 8.9 mg/dL (ref 8.9–10.3)
Chloride: 106 mmol/L (ref 98–111)
Creatinine, Ser: 1.48 mg/dL — ABNORMAL HIGH (ref 0.61–1.24)
GFR, Estimated: 56 mL/min — ABNORMAL LOW (ref >60)
Glucose, Bld: 130 mg/dL — ABNORMAL HIGH (ref 70–99)
Potassium: 4.3 mmol/L (ref 3.5–5.1)
Sodium: 136 mmol/L (ref 135–145)

## 2020-11-26 LAB — GLUCOSE, CAPILLARY
Glucose-Capillary: 123 mg/dL — ABNORMAL HIGH (ref 70–99)
Glucose-Capillary: 138 mg/dL — ABNORMAL HIGH (ref 70–99)
Glucose-Capillary: 140 mg/dL — ABNORMAL HIGH (ref 70–99)
Glucose-Capillary: 177 mg/dL — ABNORMAL HIGH (ref 70–99)
Glucose-Capillary: 159 mg/dL — ABNORMAL HIGH (ref 70–99)

## 2020-11-26 LAB — CBC
HCT: 30.9 % — ABNORMAL LOW (ref 39.0–52.0)
Hemoglobin: 10.1 g/dL — ABNORMAL LOW (ref 13.0–17.0)
MCH: 25.3 pg — ABNORMAL LOW (ref 26.0–34.0)
MCHC: 32.7 g/dL (ref 30.0–36.0)
MCV: 77.4 fL — ABNORMAL LOW (ref 80.0–100.0)
Platelets: 498 10*3/uL — ABNORMAL HIGH (ref 150–400)
RBC: 3.99 MIL/uL — ABNORMAL LOW (ref 4.22–5.81)
RDW: 15.5 % (ref 11.5–15.5)
WBC: 10.1 10*3/uL (ref 4.0–10.5)
nRBC: 0 % (ref 0.0–0.2)

## 2020-11-26 MED ORDER — COLLAGENASE 250 UNIT/GM EX OINT
TOPICAL_OINTMENT | Freq: Two times a day (BID) | CUTANEOUS | Status: DC
  Administered 2020-11-29 – 2020-12-10 (×3): 1 via TOPICAL
  Filled 2020-11-26 (×3): qty 30

## 2020-11-26 NOTE — Progress Notes (Signed)
Physical Therapy Session Note  Patient Details  Name: [name] MRN: [id number] Date of Birth: 01-26-1965  Today's Date: 11/26/2020 PT Individual Time: 1100-1200; 1600-1700 PT Individual Time Calculation (min): 60 min and 60 min  Short Term Goals: Week 1:  PT Short Term Goal 1 (Week 1): Pt will transfer sidelying to sitting w/ min A from flat bed. PT Short Term Goal 2 (Week 1): Pt will transfer sit to stand w/ mod A. PT Short Term Goal 3 (Week 1): Pt will transfer bed <> w/c w/o lifting equipment. PT Short Term Goal 4 (Week 1): Pt will negotiate w/c w/ CGA 75'  Skilled Therapeutic Interventions/Progress Updates:    Session 1: Pt received seated in w/c in room, agreeable to PT session. No complaints of pain this date. Manual w/c propulsion x 50 ft with use of RUE and BLE at Supervision level before onset of fatigue. Blocked practice of squat pivot transfers w/c to/from mat table initially with min A increasing to mod A with onset of fatigue. Reviewed LE placement during transfer, head/hips relationship, and blocked L knee due to buckling. Pt exhibits some improvement in technique with practice. Pt exhibits increased assist needed when transferring to his R side vs his L side due to L hemi and R lateral lean during transfer. Pt exhibits slumping of L side of trunk in sitting on mat table. Placed wedge under L side and utilized mirror for visual feedback, pt exhibits improved posture and positioning with use of wedge. Seated L/R lateral leans for lengthening of trunk musculature x 10 reps each direction. Placed towel under L side of cushion for improved posture in seated position in chair. Also obtained L la trap for improved LUE positioning in chair. Pt left seated in w/c in room with needs in reach, quick release belt and chair alarm in place at end of session.  Session 2: Pt received seated in w/c in room, agreeable to PT session. No complaints of pain this PM. Pt's wife [name] present  in room and able to provide further background information on patient. Per spouse pt was w/c-bound at home prior to admission since CVA in 2018. Per pt report he does spend some time standing for toileting and pericare but per his wife this was very infrequent due to pt motivation levels as well as gout limiting tolerance for functional mobility. Sit to stand x 5 reps in // bars with mod A and L knee blocked, assist needed for LUE placement on // bar. Pt exhibits tight B hamstrings, flexed trunk in standing, and weight shift onto LLE in standing. Pt able to moderately correct posture with verbal and tactile cueing. In standing pt able to flex R hip with L knee blocked, unable to lift LLE. Reviewed B HS stretching with use of stool, will provide handout for pt. Pt exhibits increased tightness in R as compared to L. Manual w/c propulsion x 100 ft with use of BLE and RUE at Supervision level. Pt left seated in w/c in room with quick release belt and chair alarm in place, wife present at end of session.  Therapy Documentation Precautions:  Precautions Precautions: Fall Precaution Comments: H/o CVA with residual L hemiparesis; dysarthric bowel incontinence Required Braces or Orthoses:  (L resting hand spint at baseline (variable schedule)) Restrictions Weight Bearing Restrictions: No    Therapy/Group: Individual Therapy   Excell Seltzer, PT, DPT, CSRS  11/26/2020, 5:27 PM

## 2020-11-26 NOTE — Progress Notes (Signed)
Patient ID: [name], male   DOB: [birth year], 55 y.o.   MRN: [id number]  Updated documentation of stage 2 to the coccyx. This is actually an unstageable wound to the left and right side of the coccyx. Order placed for Santyl, moist/dry dressings, and low air-loss mattress replacement. Notified Dr. Dagoberto Ligas of this changes. Will continue to monitor progress of wound.  Dorthula Nettles, RN3, BSN, CBIS, [place], [facility], Inpatient Rehabilitation Office [phone] Cell [phone]

## 2020-11-26 NOTE — Progress Notes (Signed)
Occupational Therapy Session Note  Patient Details  Name: [name] MRN: [id number] Date of Birth: June 23, 1965  Today's Date: 11/26/2020 OT Individual Time: 0900-1009 OT Individual Time Calculation (min): 69 min    Short Term Goals: Week 1:  OT Short Term Goal 1 (Week 1): Patient will don UB clothing with set-up assist. OT Short Term Goal 2 (Week 1): Patient will complete 2/3 parts of toileting task with Min A and lateral leans. OT Short Term Goal 3 (Week 1): Patient will complete squat-pivot transfers with Min A.  Skilled Therapeutic Interventions/Progress Updates:    Pt resting in bed upon arrival and agreeable to getting OOB for bathing/dressing. Supine>sit EOB with supervision using bed rails. Pt able to preform reciprocal scoot to EOB. Pt with difficulty maintaining sitting balance at EOB 2/2 bias in bed. Transfer to w/c with Stedy. Sit>stand in [place] with min A. Pt engaged in bathing/dressing seated in w/c with sit<>stand using Stedy. Bathing with mod A, UB dressing with min A, and LB dressing with max A. Pt brushed his teeth with setup assistance. Pt requires more then a reasonable amount of time to complete tasks. Pt remained seated in w/c with belt alarm activated and all needs within reach.   Therapy Documentation Precautions:  Precautions Precautions: Fall Precaution Comments: H/o CVA with residual L hemiparesis; dysarthric bowel incontinence Required Braces or Orthoses:  (L resting hand spint at baseline (variable schedule)) Restrictions Weight Bearing Restrictions: No   Pain:  Pt denies pain this morning   Therapy/Group: Individual Therapy  [name] 11/26/2020, 10:10 AM

## 2020-11-26 NOTE — Progress Notes (Signed)
PROGRESS NOTE   Subjective/Complaints:  No issues overnite , had loose stool with incont this am   ROS: Patient denies CP, SOB, N/V/D   Objective:   No results found. Recent Labs    11/24/20 0519  WBC 8.5  HGB 11.1*  HCT 33.7*  PLT 507*    Recent Labs    11/24/20 0519  NA 135  K 4.3  CL 104  CO2 20*  GLUCOSE 141*  BUN 21*  CREATININE 1.40*  CALCIUM 8.9     Intake/Output Summary (Last 24 hours) at 11/26/2020 0810 Last data filed at 11/26/2020 0700 Gross per 24 hour  Intake 1080 ml  Output 350 ml  Net 730 ml      Pressure Injury 11/23/20 Coccyx Bilateral Stage 2 -  Partial thickness loss of dermis presenting as a shallow open injury with a red, pink wound bed without slough. (Active)  11/23/20 1837  Location: Coccyx  Location Orientation: Bilateral  Staging: Stage 2 -  Partial thickness loss of dermis presenting as a shallow open injury with a red, pink wound bed without slough.  Wound Description (Comments):   Present on Admission: Yes    Physical Exam: Vital Signs Blood pressure 108/74, pulse 85, temperature 98.3 F (36.8 C), resp. rate 18, height 5' 2.99" (1.6 m), weight 79.9 kg, SpO2 100 %.    General: No acute distress Mood and affect are appropriate Heart: Regular rate and rhythm no rubs murmurs or extra sounds Lungs: Clear to auscultation, breathing unlabored, no rales or wheezes Abdomen: Positive bowel sounds, soft nontender to palpation, nondistended Extremities: No clubbing, cyanosis, or edema Skin: No evidence of breakdown, no evidence of rash   Musculoskeletal:        General: no pain with knee ROM     Cervical back: Normal range of motion.  Skin:    General: Skin is warm.  Neurological:     Mental Status: He is alert and oriented to person, place, and time.    Comments: Dysarthric and measured speech. Left central 7 and tongue deviation. Reasonable insight and awareness.  Functional memory. Normal language. LUE 1+ to 2/5 prox to distal. MAS 3wrist/finger flexors and biceps. LLE 3-/5 prox to 3/5 distally without  tone. Sensed pain and light touch in all 4's    Assessment/Plan: 1. Functional deficits which require 3+ hours per day of interdisciplinary therapy in a comprehensive inpatient rehab setting. Physiatrist is providing close team supervision and 24 hour management of active medical problems listed below. Physiatrist and rehab team continue to assess barriers to discharge/monitor patient progress toward functional and medical goals  Care Tool:  Bathing    Body parts bathed by patient: Left arm, Chest, Abdomen, Front perineal area, Right upper leg, Left upper leg, Face   Body parts bathed by helper: Right arm, Buttocks, Right lower leg, Left lower leg     Bathing assist Assist Level: Moderate Assistance - Patient 50 - 74%     Upper Body Dressing/Undressing Upper body dressing   What is the patient wearing?: Pull over shirt    Upper body assist Assist Level: Minimal Assistance - Patient > 75%    Lower Body Dressing/Undressing  Lower body dressing      What is the patient wearing?: Pants, Underwear/pull up     Lower body assist Assist for lower body dressing: Maximal Assistance - Patient 25 - 49%     Toileting Toileting Toileting Activity did not occur (Clothing management and hygiene only): N/A (no void or bm)  Toileting assist Assist for toileting: Maximal Assistance - Patient 25 - 49%     Transfers Chair/bed transfer  Transfers assist     Chair/bed transfer assist level: Maximal Assistance - Patient 25 - 49%     Locomotion Ambulation   Ambulation assist   Ambulation activity did not occur: Safety/medical concerns          Walk 10 feet activity   Assist  Walk 10 feet activity did not occur: Safety/medical concerns        Walk 50 feet activity   Assist Walk 50 feet with 2 turns activity did not occur:  Safety/medical concerns         Walk 150 feet activity   Assist Walk 150 feet activity did not occur: Safety/medical concerns         Walk 10 feet on uneven surface  activity   Assist Walk 10 feet on uneven surfaces activity did not occur: Safety/medical concerns         Wheelchair     Assist Is the patient using a wheelchair?: Yes Type of Wheelchair: Manual    Wheelchair assist level: Supervision/Verbal cueing Max wheelchair distance: 70    Wheelchair 50 feet with 2 turns activity    Assist        Assist Level: Supervision/Verbal cueing   Wheelchair 150 feet activity     Assist      Assist Level: Maximal Assistance - Patient 25 - 49%   Blood pressure 108/74, pulse 85, temperature 98.3 F (36.8 C), resp. rate 18, height 5' 2.99" (1.6 m), weight 79.9 kg, SpO2 100 %.  Medical Problem List and Plan: 1.  Functional deficits secondary to debility related to new onset a fib and multiple medical             -patient may shower             -ELOS/Goals: 12-14 days, min assist at w/c level  -Continue CIR therapies including PT, OT  2.  Antithrombotics: -DVT/anticoagulation:  Mechanical: Sequential compression devices, below knee Bilateral lower extremities             -antiplatelet therapy: N/a 3. Pain Management/spasticity: tylenol # 3 prn for pain.              -pt followed by [facility], has had botox in past. Looks as if these are needed again to wrist and finger flexors 4. Mood: LCSW to follow for evaluation and support.              -antipsychotic agents: N/A 5. Neuropsych: This patient is capable of making decisions on his own behalf. 6. Skin/Wound Care: Routine pressure relief Mesures.              --added beneprotein.  7. Fluids/Electrolytes/Nutrition: strict I/O. Check lytes in am.              --Monitor renal status with serial checks.  8. A fib new onset: Monitor HR TID and for symptoms with increase in activity.             --continue  Coreg, Cardizem and amiodarone             --  Plans for repeat EGD in 8 weeks for clearance on addition of DOAC 9. Gout flare: Continue Colchicine daily for now. Pt says it's improving.  11/12-flared-- Prednisone 30 mg daily x 4 days-    11/13 pt reports improvement today--wean down to 20mg  daily x 2 more days then stop 10. Acute on chronic AKI?: Due to NSAIDS for gout flare. Has resolved with SCr -1.3? Baseline .             -- 1.4 11/12--near baseline---continue to follow 11. T2DM: Hgb A1c-6.7. Monitor BS ac/hs and use SSI for elevated BS.  CBG (last 3)  Recent Labs    11/25/20 1639 11/25/20 2105 11/26/20 0615  GLUCAP 141* 112* 123*                  --Added CM restrictions. Continue to hold metformin 11/13 -cbg's not bad despite prednisone--monitor today.  12. Chronic diastolic CHF: Monitor I/O. Check daily weights.  --Will monitor for signs of overload. On Lipitor.              --Ace on hold due to recent AKI.  13. Multiple gastric/colonic ulcers: On PPI BID.             --monitor for signs of bleeding.              --Anemia stable. -11/12- HB 11.1- will con't to monitor  14.  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  Loose stools likely colchicine, gout flare responding to prednisone , will d/c colchicine  LOS: 3 days A FACE TO FACE EVALUATION WAS PERFORMED  Charlett Blake 11/26/2020, 8:10 AM

## 2020-11-26 NOTE — Care Management (Signed)
[place] Individual Statement of Services  Patient Name:  [name]  Date:  11/26/2020  Welcome to the [facility].  Our goal is to provide you with an individualized program based on your diagnosis and situation, designed to meet your specific needs.  With this comprehensive rehabilitation program, you will be expected to participate in at least 3 hours of rehabilitation therapies Monday-Friday, with modified therapy programming on the weekends.  Your rehabilitation program will include the following services:  Physical Therapy (PT), Occupational Therapy (OT), 24 hour per day rehabilitation nursing, Therapeutic Recreaction (TR), Psychology, Neuropsychology, Care Coordinator, Rehabilitation Medicine, [place], and Other  Weekly team conferences will be held on Tuesdays to discuss your progress.  Your Inpatient Rehabilitation Care Coordinator will talk with you frequently to get your input and to update you on team discussions.  Team conferences with you and your family in attendance may also be held.  Expected length of stay: 2-3 weeks  Overall anticipated outcome: Contact Guard to Supervision  Depending on your progress and recovery, your program may change. Your Inpatient Rehabilitation Care Coordinator will coordinate services and will keep you informed of any changes. Your Inpatient Rehabilitation Care Coordinator's name and contact numbers are listed  below.  The following services may also be recommended but are not provided by the [facility] will be made to provide these services after discharge if needed.  Arrangements include referral to agencies that provide these services.  Your insurance has been verified to be:  [place]  Your primary doctor is:   Karle Plumber  Pertinent information will be shared with your doctor and your insurance company.  Inpatient Rehabilitation Care Coordinator:  Cathleen Corti [phone] or ([phone]  Information discussed with and copy given to patient by: Rana Snare, 11/26/2020, 2:37 PM

## 2020-11-26 NOTE — Progress Notes (Signed)
Chaplain received consult indicating pt request for Advance Directives. Chaplain introduced spiritual care and offered support during inpatient hospitalization.  Chaplain provided education regarding Advance Directives explaining they are documents completed in advance of medical need to notify family, friends, and specifically health care providers of pt's directions for care. Chaplain explained that the two documents typically completed are the [place], which allows individuals to name the health care agent they want making medical decisions for them on their behalf, and a Living Will, which allows an individual to name their preferences for life sustaining medical intervention should they be diagnosed with an incurable or irreversible terminal condition, are unconscious and unexpected to regain consciousness, or advanced progressive dementia or substantial loss of cognitive ability which is not believed to be reversible.  Chaplain provided education on [place] process for appointing a health care agent if an individual has not appointed one through [facility], children over the age of 21 or living parents, and then siblings and relatives beyond, as well as examples of reasons many individuals feel an urgency to complete these documents such as no contact with one or both parents, a partner who is not a legal spouse, disagreement about medical issues, or a desire to have these decisions made by a friend rather than a relative.    Chaplain explained that pt will need to complete the documents and have them notarized and if an individual would like them completed while here in the hospital, a notary is typically available between 1-3:30 Monday-Thursday. Pt should reserve the final portion of the documents for completion with the notary and witnesses present, which is also written on the top of these forms. Pt should notify her providers to request a consult if pt needs additional information,  education, or is ready to notarize.  Chaplain also advised that in addition to completion of documents, it is important to have these conversations with loved ones and inform them of decisions for care later in life.  Pt did not have any further questions and stated that he would like to take more time to review the documents before proceeding. Pt was instructed to notify his nurse if he needed additional information or was ready to sign and notarize.  Please page as further needs arise.  Donald Prose. Elyn Peers, M.Div. [facility] Chaplain Pager [phone] Office [phone]    11/26/20 1242  Clinical Encounter Type  Visited With Patient  Visit Type Follow-up  Advance Directives (For Healthcare)  Does Patient Have a Medical Advance Directive? No  Would patient like information on creating a medical advance directive? Yes (Inpatient - patient defers creating a medical advance directive at this time - Information given)

## 2020-11-26 NOTE — IPOC Note (Signed)
Overall Plan of Care [facility]) Patient Details Name: NEMIAH KISSNER MRN: [id number] DOB: 23-Dec-1965  Admitting Diagnosis: Debility  Hospital Problems: Principal Problem:   Debility Active Problems:   Pressure injury of skin     Functional Problem List: Nursing Bladder, Bowel, Edema, Endurance, Medication Management, Safety  PT Balance, Safety, Endurance, Motor  OT Balance, Endurance, Motor, Safety  SLP    TR         Basic ADL's: OT Grooming, Bathing, Dressing, Toileting     Advanced  ADL's: OT       Transfers: PT Bed Mobility, Bed to Chair, Car  OT Toilet     Locomotion: PT Wheelchair Mobility     Additional Impairments: OT    SLP        TR      Anticipated Outcomes Item Anticipated Outcome  Self Feeding N/A  Swallowing      Basic self-care  set-up to Min A  Toileting  supervision to [employer] Transfers supervision to [employer] A  Bowel/Bladder  min assist  Transfers  supervision  Locomotion  w/c mobility w/ supervision.  Communication     Cognition     Pain  n/a  Safety/Judgment  min assist   Therapy Plan: PT Intensity: Minimum of 1-2 x/day ,45 to 90 minutes PT Duration Estimated Length of Stay: 2-3 weeks OT Intensity: Minimum of 1-2 x/day, 45 to 90 minutes OT Frequency: 5 out of 7 days OT Duration/Estimated Length of Stay: 2.5 weeks     Due to the current state of emergency, patients may not be receiving their 3-hours of Medicare-mandated therapy.   Team Interventions: Nursing Interventions Patient/Family Education, Bladder Management, Bowel Management, Disease Management/Prevention, Medication Management, Discharge Planning  PT interventions Community reintegration, Neuromuscular re-education, Therapeutic Activities, UE/LE Coordination activities, Wheelchair propulsion/positioning, Functional mobility training, Therapeutic Exercise  OT Interventions Balance/vestibular training, Community reintegration, Discharge planning, Disease  mangement/prevention, DME/adaptive equipment instruction, Neuromuscular re-education, Pain management, Patient/family education, Self Care/advanced ADL retraining, Therapeutic Activities, Therapeutic Exercise, UE/LE Strength taining/ROM, UE/LE Coordination activities, Wheelchair propulsion/positioning  SLP Interventions    TR Interventions    SW/CM Interventions     Barriers to Discharge MD  Medical stability  Nursing Decreased caregiver support, Incontinence, Weight, Medication compliance Lives in 1 level home with 1 step to enter and no rails. Lives with spouse who can provide min assist at discharge.  PT Other (comments) decreased strength and endurance.  OT      SLP      SW       Team Discharge Planning: Destination: PT-Home ,OT- Home , SLP-  Projected Follow-up: PT-Home health PT, OT-  Home health OT, 24 hour supervision/assistance, SLP-  Projected Equipment Needs: PT-To be determined, OT- To be determined, SLP-  Equipment Details: PT- , OT-  Patient/family involved in discharge planning: PT- Patient,  OT-Patient, SLP-   MD ELOS: 10-14d Medical Rehab Prognosis:  Good Assessment: 55 year old male with history of HTN, CVA with left hemiplegia, T2DM, seizure disorder, colon cancer, recent gout flare who was admitted on 11/14/20 /with complaints of left-sided and fall while trying to transfer out of his wheelchair.  He was found to have A. fib with RVR and was started on Cardizem drip for rate control and converted to NSR.  CT chest/abdomen/pelvis was negative for acute abnormality and cholelithiasis without inflammation.  2D echo showed EF 60 to 65% with mild concentric LVH and mild biatrial dilatation. He was started IVF for AKI with SCr- 2.9. Dr.  [facility] consulted for input and Coreg added for rate control with recommendations on initiating anticoagulation.     He was started on IV heparin but  developed acute blood loss anemia with drop in hemoglobin from 11.3-9.5.  EGD/colonoscopy  ordered for work-up but patient developed nausea vomiting with recurrent PAF and heart rates from 40-160. KUB done for work-up and revealed abdominal distention and showed marked gaseous distention of stomach with air in nondilated large and small bowel to the level of sigmoid. Patient had recurrent A. fib with RVR requiring IV amiodarone and was transitioned to po by 11/05.    UGI/Colonoscopy by Dr. Lorenso Courier on 11/06 revealed many linear esophageal ulcers without bleeding or stigmata of recent bleeding, erythematous mucosa in gastric antrum, multiple ulcers at hepatic flexure of ascending colon and cecum, 4 mm polyps, nonbleeding internal hemorrhoids..  She felt that anemia was likely due to esophageal or colonic ulcers which were felt to be ischemic in nature and.  PPI recommended for treatment of esophageal ulcers. Biopsies taken and revealed chronic gastritis, negative for H. pylori, mild chronic inflammation of esophagus, colonic mucosa with healed ulcer and tubular adenoma on rectal biopsy. To postpone DOAC for 8 weeks pending repeat EGD. Cholchicine was added on 11/06 due ongoing pain from polyarticular gout. He has had issues with tachypnea and hypotension in the past 24 hours.  Therapy ongoing and patient noted to debilitated with decline in mobility and ability to carry out ADLs.  At baseline, patient was  independent for squat pivot transfers,used WC for mobility and was able to complete ADLs with AE/assist. CIR recommended due to functional decline.       See Team Conference Notes for weekly updates to the plan of care

## 2020-11-26 NOTE — Progress Notes (Signed)
Inpatient Rehabilitation  Patient information reviewed and entered into eRehab system by Eliah Marquard M. Demara Lover, M.A., CCC/SLP, PPS Coordinator.  Information including medical coding, functional ability and quality indicators will be reviewed and updated through discharge.    

## 2020-11-27 LAB — GLUCOSE, CAPILLARY
Glucose-Capillary: 136 mg/dL — ABNORMAL HIGH (ref 70–99)
Glucose-Capillary: 150 mg/dL — ABNORMAL HIGH (ref 70–99)
Glucose-Capillary: 132 mg/dL — ABNORMAL HIGH (ref 70–99)
Glucose-Capillary: 126 mg/dL — ABNORMAL HIGH (ref 70–99)

## 2020-11-27 NOTE — Progress Notes (Addendum)
Patient ID: [name], male   DOB: Jun 30, 1965, 55 y.o.   MRN: [id number]  SW met with pt in room to provide updates from team conference, and pt called his wife [name] while SW in room. SW informed on d/ date 11/29. SW discussed wound care needs at d/c and recommendation for roho cushion. SW discussed challenges with the item being in stock and will order now to prevent delay; and challenges with obtaining HH due to insurance and wound care needs. Preferred HHA is [facility]. SW informed will explore agency, and will find an agency is this agency is unable to accept.   Loralee Pacas, MSW, [place] Office: [phone] Cell: [phone] Fax: [phone]

## 2020-11-27 NOTE — Consult Note (Signed)
[place] Nurse Consult Note: Patient receiving care in [place]. Reason for Consult: unstageable coccyx wound-hydrotherapy?-lots of eschar- call Dr. Dagoberto Ligas [phone] I called Dr. Dagoberto Ligas at the number provided, however, she was in a meeting and could not talk. Stated she will call me back. My office number was provided. Val Riles, RN, MSN, CWOCN, CNS-BC, pager [phone]

## 2020-11-27 NOTE — Progress Notes (Signed)
Educated patient on increasing fluid intake. Patient understands the importance of increasing his fluids or an IV may have to be ordered for hydration. Fluids within reach, call light within reach. Safety maintained.

## 2020-11-27 NOTE — Progress Notes (Signed)
Occupational Therapy Session Note  Patient Details  Name: [name] MRN: [id number] Date of Birth: [birth year]  Today's Date: 11/27/2020 OT Individual Time: 1300-1402 OT Individual Time Calculation (min): 62 min    Short Term Goals: Week 1:  OT Short Term Goal 1 (Week 1): Patient will don UB clothing with set-up assist. OT Short Term Goal 2 (Week 1): Patient will complete 2/3 parts of toileting task with Min A and lateral leans. OT Short Term Goal 3 (Week 1): Patient will complete squat-pivot transfers with Min A.  Skilled Therapeutic Interventions/Progress Updates:  Patient met seated on BSC in agreement with OT treatment session. 0/10 pain reported at rest and with activity. Min guard for sit to stand in [place] for hygiene/clothing management. Patient reported need for BM but no stool present in commode. Patient able to self-propel wc from hospital room to elevators with hemi technique and 1 rest break. In rehab gym patient completed squat-pivot to mat table with Min A. Part of session with focus on lateral scoots along edge of mat table in prep for squat-pivot transfers. Education on clearing bottom as not to shear on mat table given poor skin integrity on buttocks. Use of peg board activity on opposite ends of mat table to incorporate [facility] and sitting balance. Patient then completed 4 more additional squat-pivot transfers at [facility] A level with Min cues for technique. Good attention to hemiparetic LUE. At parallel bars patient completed 3 sit to stand transfers at [facility] A level. Slight bend noted in bilateral knees and at trunk despite tactile and verbal cueing for upright posture. Total A for wc transport back to hospital room and Min A for squat-pivot from wc to EOB. Assist to advance LE from EOB to bed surface. Patient able to complete supine scoots with use of RUE on bed rail. Session concluded with patient lying supine in bed with LUE supported on pillow, call bell within reach, wife present  at bedside all needs met.   Therapy Documentation Precautions:  Precautions Precautions: Fall Precaution Comments: H/o CVA with residual L hemiparesis; dysarthric bowel incontinence Required Braces or Orthoses:  (L resting hand spint at baseline (variable schedule)) Restrictions Weight Bearing Restrictions: No General:    Therapy/Group: Individual Therapy  Harsh Trulock R [place] 11/27/2020, 10:22 AM

## 2020-11-27 NOTE — Consult Note (Signed)
[place] Nurse Consult Note: I went to [facility] for the purpose of seeing the patient's wound. However, he is in the chair eating his lunch. I had a detailed conversation with his primary nurse, Kennyth Lose.  She explained the wound is butterfly shape, encompasses the sacrum, coccyx, intergluteal cleft, and buttocks on each side of the cleft. It is dark around the edges, and yellow and tan slough with some pink tissue.  She reports there is no bogginess around the wound when she presses.  There is currently an order for santyl and moistened gauze with foam dressing to the site bid.  PT hydrotherapy would hasten the removal of the non-viable tissue.  I spoke with Dr. Dagoberto Ligas via telephone and she is going to request PT hydrotherapy.  Val Riles, RN, MSN, CWOCN, CNS-BC, pager [phone]

## 2020-11-27 NOTE — Patient Care Conference (Signed)
Inpatient RehabilitationTeam Conference and Plan of Care Update Date: 11/27/2020   Time: 11:12 AM    Patient Name: [name]      Medical Record Number: [id number]  Date of Birth: [birth year] Sex: Male         Room/Bed: 5C02C/5C02C-01 Payor Info: Payor: [place] / Plan: BCBS COMM PPO / Product Type: *No Product type* /    Admit Date/Time:  11/23/2020  6:08 PM  Primary Diagnosis:  [place] [facility] Problems: Principal Problem:   Debility Active Problems:   Pressure injury of skin    Expected Discharge Date: Expected Discharge Date: 12/11/20  Team Members Present: Physician leading conference: Dr. Courtney Heys Social Worker Present: Loralee Pacas, [place] Nurse Present: Dorthula Nettles, RN PT Present: Excell Seltzer, PT OT Present: Roanna Epley, [place], OT PPS Coordinator present : Ileana Ladd, PT     Current Status/Progress Goal Weekly Team Focus  Bowel/Bladder   Continent at times and incontinent at others. LBM 11/14  Regain full continence  Toilet q2hr.   Swallow/Nutrition/ Hydration             ADL's   LB dressing-max A; UB dressing-mod A: bathing-mod A: functional transfers-mod A/max A; toileting-max A  min A overall  BADL retraining, funcitonal transfers, safety awareness, education   Mobility   mod A bed mobility, mod A squat pivot transfers vs stedy, Supervision w/c mobility x 100 ft  Supervision overall at w/c level  transfers, w/c mobility   Communication             Safety/Cognition/ Behavioral Observations            Pain   no c/o pain  remain pain free  Assess qshift and prn   Skin   Unstageable pressure injury to buttocks.  Promote healing and prevention of infection.  Continue wound care orders. Assess Qshift and prn     Discharge Planning:  Pt to d/c to home with his wife who will provide 24/7 care. SW will confirm no barriers to discharge.   Team Discussion: Gout pain resolved. Will do Botox post discharge.  Cr 1.48, nursing to push fluids. Unstageable to coccyx area. Ordered WOC consult. Will order PT Hydrotherapy. Currently using Santyl, moist/dry dressing changes BID. Incontinent B/B, implement timed toileting schedule. Will keep IV in case of fluid need. Rates pain 4/10 with activity. Patient placed on Low air loss mattress, therapy adding Roho cushion. Patient on target to meet rehab goals: yes, min assist at W/C level. Currently mod assist squat. Does need help with bathing, max assist due to weakness.  *See Care Plan and progress notes for long and short-term goals.   Revisions to Treatment Plan:  Consulting WOC and hydrotherapy.  Teaching Needs: Family education, medication/pain management, skin/wound care, safety awareness, transfer training, etc.  Current Barriers to Discharge: Decreased caregiver support, Home enviroment access/layout, Incontinence, Wound care, Lack of/limited family support, Weight, and Medication compliance  Possible Resolutions to Barriers: Family education Hydro-therapy for unstageable wound Order DME  Follow-up HH/Outpatient therapy     Medical Summary Current Status: has unstageable coccyx ulcer- 2 that are smushed together; spasticity from previous CVA; gout resolved; incontinent- pain with activity- on air mattress; santyl and moist to dry.  Barriers to Discharge: Decreased family/caregiver support;Home enviroment access/layout;Weight;Medical stability;Wound care;Weight bearing restrictions;Other (comments);Incontinence;Neurogenic Bowel & Bladder;Nutrition means  Barriers to Discharge Comments: previous CVA- 24/7 with wife; at w/c level prior- Possible Resolutions to [employer] Focus: needs to drink or get  IVFs- labs THursday; [place] by Dr Raliegh Ip after d/c; stay off backside unless in therapy/eating; max A due to weakness; got ROHO cushion now; and will need to go home; d/c 11/29- WOC consult- likely will need Hydrotherapy   Continued Need for Acute  Rehabilitation Level of Care: The patient requires daily medical management by a physician with specialized training in physical medicine and rehabilitation for the following reasons: Direction of a multidisciplinary physical rehabilitation program to maximize functional independence : Yes Medical management of patient stability for increased activity during participation in an intensive rehabilitation regime.: Yes Analysis of laboratory values and/or radiology reports with any subsequent need for medication adjustment and/or medical intervention. : Yes   I attest that I was present, lead the team conference, and concur with the assessment and plan of the team.   Cristi Loron 11/27/2020, 3:14 PM

## 2020-11-27 NOTE — Progress Notes (Signed)
Occupational Therapy Session Note  Patient Details  Name: [name] MRN: [id number] Date of Birth: 15-May-1965  Today's Date: 11/27/2020 OT Individual Time: 0800-0900 OT Individual Time Calculation (min): 60 min    Short Term Goals: Week 1:  OT Short Term Goal 1 (Week 1): Patient will don UB clothing with set-up assist. OT Short Term Goal 2 (Week 1): Patient will complete 2/3 parts of toileting task with Min A and lateral leans. OT Short Term Goal 3 (Week 1): Patient will complete squat-pivot transfers with Min A.  Skilled Therapeutic Interventions/Progress Updates:    Pt resting in bed upon arrival and requesting bed pan. Pt declined to get on [facility]. Pt voided in bed pan and was also incontinent of bladder. RN assisted with changing dressing to coccyx. Supine>sit EOB with supervision. Transfer to w/c with Stedy. Bathing/dressing per Care [employer]. Sit<>stand in [place] with CGA. Pt requires max A for LB dressing tasks. Pt remained in w/c with all needs within reach and belt alarm activated.  Therapy Documentation Precautions:  Precautions Precautions: Fall Precaution Comments: H/o CVA with residual L hemiparesis; dysarthric bowel incontinence Required Braces or Orthoses:  (L resting hand spint at baseline (variable schedule)) Restrictions Weight Bearing Restrictions: No Pain: Pain Assessment Pain Scale: 0-10 Pain Score: 0-No pain Multiple Pain Sites: No   Therapy/Group: Individual Therapy  [name] 11/27/2020, 2:48 PM

## 2020-11-27 NOTE — Progress Notes (Signed)
PROGRESS NOTE   Subjective/Complaints:  Pt reported gout pain is gone completely.  Has "sores on his bottom that haven't healed".    ROS:   Pt denies SOB, abd pain, CP, N/V/C/D, and vision changes   Objective:   No results found. Recent Labs    11/26/20 0826  WBC 10.1  HGB 10.1*  HCT 30.9*  PLT 498*   Recent Labs    11/26/20 0826  NA 136  K 4.3  CL 106  CO2 21*  GLUCOSE 130*  BUN 24*  CREATININE 1.48*  CALCIUM 8.9    Intake/Output Summary (Last 24 hours) at 11/27/2020 1033 Last data filed at 11/27/2020 0045 Gross per 24 hour  Intake 240 ml  Output 350 ml  Net -110 ml     Pressure Injury 11/26/20 Coccyx Right;Lower;Medial Unstageable - Full thickness tissue loss in which the base of the injury is covered by slough (yellow, tan, gray, green or brown) and/or eschar (tan, brown or black) in the wound bed. Yellow area of slou (Active)  11/26/20 1336  Location: Coccyx  Location Orientation: Right;Lower;Medial  Staging: Unstageable - Full thickness tissue loss in which the base of the injury is covered by slough (yellow, tan, gray, green or brown) and/or eschar (tan, brown or black) in the wound bed.  Wound Description (Comments): Yellow area of slough  Present on Admission: Yes     Pressure Injury 11/26/20 Coccyx Left;Lower;Medial Unstageable - Full thickness tissue loss in which the base of the injury is covered by slough (yellow, tan, gray, green or brown) and/or eschar (tan, brown or black) in the wound bed. Thick yellow and bro (Active)  11/26/20 1340  Location: Coccyx  Location Orientation: Left;Lower;Medial  Staging: Unstageable - Full thickness tissue loss in which the base of the injury is covered by slough (yellow, tan, gray, green or brown) and/or eschar (tan, brown or black) in the wound bed.  Wound Description (Comments): Thick yellow and brown tissue. Unable to view healthy tissue.  Present on  Admission: Yes    Physical Exam: Vital Signs Blood pressure 97/61, pulse 69, temperature 97.7 F (36.5 C), temperature source Oral, resp. rate 18, height 5' 2.99" (1.6 m), weight 79.9 kg, SpO2 96 %.     General: awake, alert, appropriate, sitting up in bed; NAD HENT: dysconjugate gaze; oropharynx moist CV: regular rate; no JVD Pulmonary: CTA B/L; no W/R/R- good air movement GI: soft, NT, ND, (+)BS Psychiatric: appropriate- dysarthric Neurological: Ox3   Musculoskeletal:        General: no pain with knee ROM Knees swelling has resolved; no heat    Cervical back: Normal range of motion.  Skin:    General: Skin is warm. Wounds on backside- C/D/I covered Neurological:     Mental Status: He is alert and oriented to person, place, and time.    Comments: Dysarthric and measured speech. Left central 7 and tongue deviation. Reasonable insight and awareness. Functional memory. Normal language. LUE 1+ to 2/5 prox to distal. MAS 3wrist/finger flexors and biceps. LLE 3-/5 prox to 3/5 distally without  tone. Sensed pain and light touch in all 4's    Assessment/Plan: 1. Functional deficits which require  3+ hours per day of interdisciplinary therapy in a comprehensive inpatient rehab setting. Physiatrist is providing close team supervision and 24 hour management of active medical problems listed below. Physiatrist and rehab team continue to assess barriers to discharge/monitor patient progress toward functional and medical goals  Care Tool:  Bathing    Body parts bathed by patient: Left arm, Chest, Abdomen, Front perineal area, Right upper leg, Left upper leg, Face   Body parts bathed by helper: Right arm, Buttocks, Right lower leg, Left lower leg     Bathing assist Assist Level: Moderate Assistance - Patient 50 - 74%     Upper Body Dressing/Undressing Upper body dressing   What is the patient wearing?: Pull over shirt    Upper body assist Assist Level: Minimal Assistance - Patient  > 75%    Lower Body Dressing/Undressing Lower body dressing      What is the patient wearing?: Incontinence brief, Pants     Lower body assist Assist for lower body dressing: Maximal Assistance - Patient 25 - 49%     Toileting Toileting Toileting Activity did not occur (Clothing management and hygiene only): N/A (no void or bm)  Toileting assist Assist for toileting: Maximal Assistance - Patient 25 - 49%     Transfers Chair/bed transfer  Transfers assist     Chair/bed transfer assist level: Maximal Assistance - Patient 25 - 49%     Locomotion Ambulation   Ambulation assist   Ambulation activity did not occur: Safety/medical concerns          Walk 10 feet activity   Assist  Walk 10 feet activity did not occur: Safety/medical concerns        Walk 50 feet activity   Assist Walk 50 feet with 2 turns activity did not occur: Safety/medical concerns         Walk 150 feet activity   Assist Walk 150 feet activity did not occur: Safety/medical concerns         Walk 10 feet on uneven surface  activity   Assist Walk 10 feet on uneven surfaces activity did not occur: Safety/medical concerns         Wheelchair     Assist Is the patient using a wheelchair?: Yes Type of Wheelchair: Manual    Wheelchair assist level: Supervision/Verbal cueing Max wheelchair distance: 100'    Wheelchair 50 feet with 2 turns activity    Assist        Assist Level: Supervision/Verbal cueing   Wheelchair 150 feet activity     Assist      Assist Level: Maximal Assistance - Patient 25 - 49%   Blood pressure 97/61, pulse 69, temperature 97.7 F (36.5 C), temperature source Oral, resp. rate 18, height 5' 2.99" (1.6 m), weight 79.9 kg, SpO2 96 %.  Medical Problem List and Plan: 1.  Functional deficits secondary to debility related to new onset a fib and multiple medical             -patient may shower             -ELOS/Goals: 12-14 days, min  assist at w/c level  -con't CIR PT and OT- team conference today to determine length of stay 2.  Antithrombotics: -DVT/anticoagulation:  Mechanical: Sequential compression devices, below knee Bilateral lower extremities             -antiplatelet therapy: N/a 3. Pain Management/spasticity: tylenol # 3 prn for pain.              -  pt followed by [facility], has had botox in past. Looks as if these are needed again to wrist and finger flexors  11/15- will have pt see Dr Letta Pate for Botox after d/c.  4. Mood: LCSW to follow for evaluation and support.              -antipsychotic agents: N/A 5. Neuropsych: This patient is capable of making decisions on his own behalf. 6. Skin/Wound Care: Routine pressure relief Mesures.              --added beneprotein.  7. Fluids/Electrolytes/Nutrition: strict I/O. Check lytes in am.              --Monitor renal status with serial checks.  8. A fib new onset: Monitor HR TID and for symptoms with increase in activity.             --continue Coreg, Cardizem and amiodarone             --Plans for repeat EGD in 8 weeks for clearance on addition of DOAC 9. Gout flare: Continue Colchicine daily for now. Pt says it's improving.  11/12-flared-- Prednisone 30 mg daily x 4 days-    11/13 pt reports improvement today--wean down to 20mg  daily x 2 more days then stop 10. Acute on chronic AKI?: Due to NSAIDS for gout flare. Has resolved with SCr -1.3? Baseline .             -- 1.4 11/12--near baseline---continue to follow  11/15- Cr up slightly to 1.48- but baseline is 1.3 - will have pt push fluids and recheck Thursday.  11. T2DM: Hgb A1c-6.7. Monitor BS ac/hs and use SSI for elevated BS.  CBG (last 3)  Recent Labs    11/26/20 2122 11/26/20 2132 11/27/20 0628  GLUCAP 177* 159* 136*                 --Added CM restrictions. Continue to hold metformin 11/13 -cbg's not bad despite prednisone--monitor today.  11/15- BG's look OK- off prednisone 12. Chronic diastolic CHF:  Monitor I/O. Check daily weights.  --Will monitor for signs of overload. On Lipitor.              --Ace on hold due to recent AKI.  13. Multiple gastric/colonic ulcers: On PPI BID.             --monitor for signs of bleeding.              --Anemia stable. -11/12- HB 11.1- will con't to monitor  14.  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  Loose stools likely colchicine, gout flare responding to prednisone , will d/c colchicine   11/15- doing better-con't to monitor 16. Pressure ulcers on backside  11/15- has unstageable full thickness on coccyx - will con't local wound care- if doesn't improve, will call WOC- seen by our WTA.    LOS: 4 days A FACE TO FACE EVALUATION WAS PERFORMED  Shantera Monts 11/27/2020, 10:33 AM

## 2020-11-27 NOTE — Progress Notes (Signed)
Physical Therapy Session Note  Patient Details  Name: [name] MRN: [id number] Date of Birth: [birth year]  Today's Date: 11/27/2020 PT Individual Time: 0930-1030; 1130-1200 PT Individual Time Calculation (min): 60 min and 30 min  Short Term Goals: Week 1:  PT Short Term Goal 1 (Week 1): Pt will transfer sidelying to sitting w/ min A from flat bed. PT Short Term Goal 2 (Week 1): Pt will transfer sit to stand w/ mod A. PT Short Term Goal 3 (Week 1): Pt will transfer bed <> w/c w/o lifting equipment. PT Short Term Goal 4 (Week 1): Pt will negotiate w/c w/ CGA 75'  Skilled Therapeutic Interventions/Progress Updates:    Session 1: Pt received seated in w/c in room, agreeable to PT session. Pt reports some pain in his buttocks at site of skin breakdown. Provided pt with Roho cushion for improved skin integrity and comfort in seated position. Session focus on blocked practice of squat pivot transfers to the R and the L. Today pt requires min A with squat pivot to the R and mod A to the L with increased assist needed with onset of fatigue. Pt continues to exhibit difficulty with maintain head/hips relationship when transferring to the R due to L hemi-body weakness. Blocked L knee during transfers due to knees buckling. Pt left seated in w/c in room with needs in reach, quick release belt and chair alarm in place at end of session.  Session 2: Pt received seated in w/c in room, agreeable to PT session. Pt reports ongoing soreness in his bottom but is improved with Roho cushion. Session focus on seated balance and reaching outside BOS and across midline with RUE for targets progressing from cones to clothespins, CGA to min A for trunk control. Manual w/c propulsion x 50 ft with use of RUE and BLE at Supervision level before onset of fatigue. Pt left seated in w/c in room with needs in reach, quick release belt and chair alarm in place at end of session.  Therapy Documentation Precautions:   Precautions Precautions: Fall Precaution Comments: H/o CVA with residual L hemiparesis; dysarthric bowel incontinence Required Braces or Orthoses:  (L resting hand spint at baseline (variable schedule)) Restrictions Weight Bearing Restrictions: No    Therapy/Group: Individual Therapy  Excell Seltzer, PT, DPT, CSRS  11/27/2020, 12:20 PM

## 2020-11-28 ENCOUNTER — Telehealth: Payer: Self-pay

## 2020-11-28 ENCOUNTER — Other Ambulatory Visit: Payer: Self-pay

## 2020-11-28 LAB — GLUCOSE, CAPILLARY
Glucose-Capillary: 129 mg/dL — ABNORMAL HIGH (ref 70–99)
Glucose-Capillary: 135 mg/dL — ABNORMAL HIGH (ref 70–99)
Glucose-Capillary: 140 mg/dL — ABNORMAL HIGH (ref 70–99)
Glucose-Capillary: 128 mg/dL — ABNORMAL HIGH (ref 70–99)

## 2020-11-28 MED ORDER — OXYCODONE-ACETAMINOPHEN 5-325 MG PO TABS: 1.0000 | ORAL_TABLET | Freq: Every day | ORAL | Status: DC

## 2020-11-28 MED ORDER — OXYCODONE HCL 5 MG PO TABS
5.0000 mg | ORAL_TABLET | Freq: Once | ORAL | Status: AC
  Administered 2020-11-28: 5 mg via ORAL
  Filled 2020-11-28: qty 1

## 2020-11-28 MED ORDER — OXYCODONE HCL 5 MG PO TABS
15.0000 mg | ORAL_TABLET | Freq: Every day | ORAL | Status: DC
  Administered 2020-11-29 – 2020-12-14 (×14): 15 mg via ORAL
  Filled 2020-11-28 (×14): qty 3

## 2020-11-28 MED ORDER — DILTIAZEM HCL 60 MG PO TABS
30.0000 mg | ORAL_TABLET | Freq: Four times a day (QID) | ORAL | Status: DC
  Administered 2020-11-28 – 2020-12-14 (×51): 30 mg via ORAL
  Filled 2020-11-28 (×37): qty 1
  Filled 2020-11-28: qty 0.5
  Filled 2020-11-28 (×11): qty 1
  Filled 2020-11-28: qty 0.5
  Filled 2020-11-28 (×5): qty 1
  Filled 2020-11-28: qty 0.5
  Filled 2020-11-28: qty 1
  Filled 2020-11-28: qty 0.5

## 2020-11-28 MED ORDER — OXYCODONE-ACETAMINOPHEN 5-325 MG PO TABS
1.0000 | ORAL_TABLET | Freq: Once | ORAL | Status: AC
  Administered 2020-11-28: 1 via ORAL
  Filled 2020-11-28: qty 1

## 2020-11-28 MED ORDER — OXYCODONE HCL 5 MG PO TABS
5.0000 mg | ORAL_TABLET | ORAL | Status: DC | PRN
  Administered 2020-11-28 – 2020-11-29 (×2): 10 mg via ORAL
  Administered 2020-11-29 – 2020-11-30 (×3): 5 mg via ORAL
  Administered 2020-12-03 – 2020-12-07 (×7): 10 mg via ORAL
  Administered 2020-12-09: 01:00:00 5 mg via ORAL
  Administered 2020-12-09: 06:00:00 10 mg via ORAL
  Administered 2020-12-11: 5 mg via ORAL
  Administered 2020-12-12 – 2020-12-13 (×2): 10 mg via ORAL
  Filled 2020-11-28: qty 2
  Filled 2020-11-28 (×2): qty 1
  Filled 2020-11-28 (×2): qty 2
  Filled 2020-11-28: qty 1
  Filled 2020-11-28 (×4): qty 2
  Filled 2020-11-28: qty 1
  Filled 2020-11-28 (×4): qty 2
  Filled 2020-11-28: qty 1
  Filled 2020-11-28 (×4): qty 2

## 2020-11-28 MED ORDER — OXYCODONE HCL 5 MG PO TABS: 5.0000 mg | ORAL_TABLET | Freq: Every day | ORAL | Status: DC

## 2020-11-28 MED ORDER — ACETAMINOPHEN-CODEINE #3 300-30 MG PO TABS
1.0000 | ORAL_TABLET | Freq: Three times a day (TID) | ORAL | Status: DC | PRN
  Administered 2020-11-30 – 2020-12-09 (×4): 1 via ORAL
  Filled 2020-11-28 (×4): qty 1

## 2020-11-28 NOTE — Progress Notes (Signed)
Physical Therapy Session Note  Patient Details  Name: [name] MRN: [id number] Date of Birth: 1965/05/23  Today's Date: 11/28/2020 PT Individual Time: 7121-9758 PT Individual Time Calculation (min): 70 min   Short Term Goals: Week 1:  PT Short Term Goal 1 (Week 1): Pt will transfer sidelying to sitting w/ min A from flat bed. PT Short Term Goal 2 (Week 1): Pt will transfer sit to stand w/ mod A. PT Short Term Goal 3 (Week 1): Pt will transfer bed <> w/c w/o lifting equipment. PT Short Term Goal 4 (Week 1): Pt will negotiate w/c w/ CGA 75'  Skilled Therapeutic Interventions/Progress Updates:    Pt received seated in w/c in room, agreeable to PT session. Pt reports ongoing pain in buttocks. Reviewed pressure relief techinques via modified w/c pushup that pt can perform while seated up in chair throughout the day, encouraged pt to perform every 20 min. Assessed Roho cushion and determined to be inflated properly. Squat pivot transfer to mat table with mod A to the L. Supine to/from sit with mod A initially, improving to min A with cues for technique, increasing again to mod A with onset of fatigue from L side of mat table to simulate home environment. Pt requires assist for LLE management and trunk control. Pt able to perform rolling L/R with min A with some assist needed for LLE management. Seated balance with therapy wedge under L hip for improved pelvic alignment in sitting while reaching outside BOS and across midline with RUE for targets, CGA to min A for trunk control. Scooting L/R on mat table with CGA with cues for sequencing and head/hips relationship. Seated lateral leans x 10 reps to the L onto therapy wedge with min A increasing to mod A needed to return to midline, x 10 reps to the R onto mat table with CGA needed to return to midline for core strengthening and WBing through UE. Squat pivot transfer back to w/c with mod A at end of session. Pt left seated in w/c in room with needs  in reach, quick  release belt and chair alarm in place.  Therapy Documentation Precautions:  Precautions Precautions: Fall Precaution Comments: H/o CVA with residual L hemiparesis; dysarthric bowel incontinence Required Braces or Orthoses:  (L resting hand spint at baseline (variable schedule)) Restrictions Weight Bearing Restrictions: No      Therapy/Group: Individual Therapy   Excell Seltzer, PT, DPT, CSRS  11/28/2020, 1:55 PM

## 2020-11-28 NOTE — Progress Notes (Addendum)
Inpatient Rehabilitation Care Coordinator Assessment and Plan Patient Details  Name: [name] MRN: [id number] Date of Birth: 04/24/1965  Today's Date: 11/28/2020  Hospital Problems: Principal Problem:   Debility Active Problems:   Pressure injury of skin  Past Medical History:  Past Medical History:  Diagnosis Date   Colon cancer ([place]) 2009   rectal   Diabetes mellitus without complication ([place])    Gait abnormality 03/31/2018   Hemiparesis and speech and language deficit as late effects of stroke ([place]) 03/31/2018   Hypertension    Seizure ([place])    Stroke ([place]) 2018   Past Surgical History:  Past Surgical History:  Procedure Laterality Date   APPENDECTOMY     BIOPSY  11/18/2020   Procedure: BIOPSY;  Surgeon: Sharyn Creamer, MD;  Location: [facility] ENDOSCOPY;  Service: Gastroenterology;;   COLONOSCOPY N/A 11/18/2020   Procedure: COLONOSCOPY;  Surgeon: Sharyn Creamer, MD;  Location: [place];  Service: Gastroenterology;  Laterality: N/A;   ESOPHAGOGASTRODUODENOSCOPY (EGD) WITH PROPOFOL N/A 11/18/2020   Procedure: ESOPHAGOGASTRODUODENOSCOPY (EGD) WITH PROPOFOL;  Surgeon: Sharyn Creamer, MD;  Location: [place];  Service: Gastroenterology;  Laterality: N/A;   JOINT REPLACEMENT Left 2004   Left hip   POLYPECTOMY  11/18/2020   Procedure: POLYPECTOMY;  Surgeon: Sharyn Creamer, MD;  Location: [facility] ENDOSCOPY;  Service: Gastroenterology;;   TOTAL HIP ARTHROPLASTY     Social History:  reports that he has never smoked. He has never used smokeless tobacco. He reports that he does not currently use alcohol. He reports that he does not use drugs.  Family / Support Systems Marital Status: Married How Long?: 29 years Patient Roles: Spouse, Parent Spouse/Significant Other: [name] (wife) Children: 1 adult dtr (48 yrs old) Other Supports: NOne reported Anticipated Caregiver: Wife Ability/Limitations of Caregiver: None reported; wife currently on FMLA Caregiver Availability:  24/7 Family Dynamics: Pt lives with his wife, and adult dtr.  Social History Preferred language: English Religion: Christian Cultural Background: Pt worked as a Art therapist for [employer] Education: college [place] - How often do you need to have someone help you when you read instructions, pamphlets, or other written material from your doctor or pharmacy?: Sometimes Writes: Yes Employment Status: Disabled Date Retired/Disabled/Unemployed: 2018 Public relations account executive Issues: Denies Guardian/Conservator: N/A   Abuse/Neglect Abuse/Neglect Assessment Can Be Completed: Yes Physical Abuse: Denies Verbal Abuse: Denies Sexual Abuse: Denies Exploitation of patient/patient's resources: Denies Self-Neglect: Denies  Patient response to: Social Isolation - How often do you feel lonely or isolated from those around you?: Never  Emotional Status Pt's affect, behavior and adjustment status: Pt in good spirits at time fo visit Recent Psychosocial Issues: Denies Psychiatric History: Denies Substance Abuse History: Denies  Patient / Family Perceptions, Expectations & Goals Pt/Family understanding of illness & functional limitations: Pt and family have a general understanding of pt care needs Premorbid pt/family roles/activities: Some assistance with ADLs/IADLs Anticipated changes in roles/activities/participation: Assistance with ADLs/IADLs Pt/family expectations/goals: Pt goal "being mobile, transfer, get back into being independent, and moving around the house."  [employer]: None Premorbid Home Care/DME Agencies: None Transportation available at discharge: Wife Is the patient able to respond to transportation needs?: Yes In the past 12 months, has lack of transportation kept you from medical appointments or from getting medications?: No In the past 12 months, has lack of transportation kept you from meetings, work, or from  getting things needed for daily living?: No Resource referrals recommended: Neuropsychology  Discharge Planning  Living Arrangements: Spouse/significant other, Children Support Systems: Spouse/significant other, Children Type of Residence: Private residence Insurance Resources: Multimedia programmer (specify) Nurse, mental health (primary) and [employer]) Financial Resources: SSD Financial Screen Referred: No Living Expenses: Medical laboratory scientific officer Management: Spouse Does the patient have any problems obtaining your medications?: No Home Management: Pt reports wife managed all homecare needs Patient/Family Preliminary Plans: No changes Care Coordinator Barriers to Discharge: Decreased caregiver support, Lack of/limited family support Care Coordinator Anticipated Follow Up Needs: HH/OP  Clinical Impression SW met with pt in room to introduce self, explain role, and discuss discharge process. Pt is not a English as a second language teacher. Wife HCPOA. DME: w/c, RW, 3in1 BSC, hemiwalker, quad cane, and TTB. Pt aware SW to follow-up with his wife.   49- SW spoke with pt wife to introduce self, explain role, and discuss discharge process. Reported that she is here in the hospital and reported concerns about the wound, and being happy that the bed was being changed out. SW informed will follow-up tomorrow after team conference.   Ceira Hoeschen A Arhaan Chesnut 11/28/2020, 3:54 PM

## 2020-11-28 NOTE — Progress Notes (Signed)
Patient ID: [name], male   DOB: [birth year], 54 y.o.   MRN: [id number]  SW sent HHPT/OT/SN (wound care) order to Amy[facility] and waiting on follow-up. *Referral declined as stated out of network with insurance.  SW ordered roho cushion with [facility] via parachute.   SW sent [place] referral to [facility] and Angela[facility] and waiting on follow-up.   Loralee Pacas, MSW, [place] Office: [phone] Cell: [phone] Fax: [phone]

## 2020-11-28 NOTE — Progress Notes (Signed)
Physical Therapy Wound Treatment Patient Details  Name: [name] MRN: [id number] Date of Birth: 1965/05/16  Today's Date: 11/28/2020 PT Individual Time: 8921-1941 PT Individual Time Calculation (min): 70 min   Subjective  Patient and Family Stated Goals: pt didn't state Date of Onset:  (unknow) Prior Treatments:  (unknown)  Pain Score:  4/10 after premedication  Wound Assessment  Pressure Injury 11/23/20 Coccyx Right;Lower;Medial Unstageable - Full thickness tissue loss in which the base of the injury is covered by slough (yellow, tan, gray, green or brown) and/or eschar (tan, brown or black) in the wound bed. Yellow area of slou (Active)  Wound Image   11/28/20 1556  Dressing Type Barrier Film (skin prep);Foam - Lift dressing to assess site every shift;Gauze (Comment);Moist to dry;Normal saline moist dressing;Santyl 11/28/20 1556  Dressing Changed 11/28/20 1556  Dressing Change Frequency Twice a day 11/28/20 1556  State of Healing Eschar 11/28/20 1556  Site / Wound Assessment Clean;Yellow;Brown 11/28/20 1556  % Wound base Red or Granulating 5% 11/28/20 1556  % Wound base Yellow/Fibrinous Exudate 95% 11/28/20 1556  Peri-wound Assessment Maceration 11/26/20 1337  Wound Length (cm) 5 cm 11/28/20 1556  Wound Width (cm) 3.5 cm 11/28/20 1556  Wound Depth (cm) 3 cm 11/28/20 1556  Wound Surface Area (cm^2) 17.5 cm^2 11/28/20 1556  Wound Volume (cm^3) 52.5 cm^3 11/28/20 1556  Margins Unattached edges (unapproximated) 11/28/20 1556  Drainage Amount Minimal 11/28/20 1556  Drainage Description Serous;Sanguineous 11/28/20 1556  Treatment Cleansed;Debridement (Selective);Hydrotherapy (Pulse lavage);Packing (Saline gauze) 11/28/20 1556      Hydrotherapy Pulsed lavage therapy - wound location: medial coccyx to R buttock Pulsed Lavage with Suction (psi): 12 psi Pulsed Lavage with Suction - Normal Saline Used: 1000 mL Pulsed Lavage Tip: Tip with splash shield Selective  Debridement Selective Debridement - Location: coccyx to r buttock Selective Debridement - Tools Used: Forceps;Scalpel Selective Debridement - Tissue Removed: yellow eschar   Wound Assessment and Plan  Wound Therapy - Assess/Plan/Recommendations Wound Therapy - Clinical Statement: pt will benefit for Pulsed Lavage to soften eschar for selective debridement and decrease the bioburden in the wound. Wound Therapy - Functional Problem List: functional immobility Factors Delaying/Impairing Wound Healing: Altered sensation;Infection - systemic/local;Immobility Hydrotherapy Plan: Debridement;Dressing change;Patient/family education;Pulsatile lavage with suction Wound Therapy - Frequency: 6X / week Wound Therapy - Current Recommendations: PT Wound Therapy - Follow Up Recommendations: dressing changes by RN  Wound Therapy Goals- Improve the function of patient's integumentary system by progressing the wound(s) through the phases of wound healing (inflammation - proliferation - remodeling) by: Decrease Necrotic Tissue to: 20 % Decrease Necrotic Tissue - Progress: Goal set today Increase Granulation Tissue to: 80% Increase Granulation Tissue - Progress: Goal set today Goals/treatment plan/discharge plan were made with and agreed upon by patient/family: Yes Time For Goal Achievement: 7 days Wound Therapy - Potential for Goals: Good  Goals will be updated until maximal potential achieved or discharge criteria met.  Discharge criteria: when goals achieved, discharge from hospital, MD decision/surgical intervention, no progress towards goals, refusal/missing three consecutive treatments without notification or medical reason.  GP     Tessie Fass Karinda Cabriales 11/28/2020, 4:05 PM 11/28/2020  Ginger Carne., PT Acute Rehabilitation Services [phone]  (pager) [phone]  (office)

## 2020-11-28 NOTE — Telephone Encounter (Signed)
Call placed to patient's wife, [name] , regarding FMLA paperwork that she dropped off for Dr Wynetta Emery.  She explained that the start of her leave was 11/14/2020 and her HR department is aware of that date.  She has been away from her job since then.  She has not yet determined an end date for her leave.  Her husband has a tentative discharge date from rehab on 12/11/2020 but that may change.  She is also not sure of exactly what his needs will be at the time of discharge.  Home Health has not been arranged yet and she is not sure how much home health care will even meet his needs.

## 2020-11-28 NOTE — Progress Notes (Signed)
Occupational Therapy Session Note  Patient Details  Name: [name] MRN: [id number] Date of Birth: July 02, 1965  Today's Date: 11/28/2020 OT Individual Time: 0700-0810 OT Individual Time Calculation (min): 70 min    Short Term Goals: Week 1:  OT Short Term Goal 1 (Week 1): Patient will don UB clothing with set-up assist. OT Short Term Goal 2 (Week 1): Patient will complete 2/3 parts of toileting task with Min A and lateral leans. OT Short Term Goal 3 (Week 1): Patient will complete squat-pivot transfers with Min A.  Skilled Therapeutic Interventions/Progress Updates:    Pt resting in bed upon arrival and agreeable to getting OOB for bathing/dressing. Supine>sit EOB with supervision using bed rails. Pt able to perform reciprocal scoot to EOB in preparation for sit>stand in [place]. Sit>stand in [place] with CGA. Pt engaged in bathing/dressing tasks with sit<>stand from w/c. Pt requires assistance bathing lower legs and buttocks. UB dressing with supervision. Pt threads BLE into pants and pulls pants up over knees. Pt requires assistance pulling pants over hips when standing in [place]. Pt requires assistance fastening shoes. Pt requires more then a reasonable amount of time to complete tasks. Pt reamined in w/c with all needs within reach. Half lap tray in place and belt alarm activated.   Therapy Documentation Precautions:  Precautions Precautions: Fall Precaution Comments: H/o CVA with residual L hemiparesis; dysarthric bowel incontinence Required Braces or Orthoses:  (L resting hand spint at baseline (variable schedule)) Restrictions Weight Bearing Restrictions: No Pain:  Pt reports his "bottom hurts" but unrated; RN and MD aware; repositioned   Therapy/Group: Individual Therapy  [name] 11/28/2020, 8:11 AM

## 2020-11-28 NOTE — Progress Notes (Signed)
PROGRESS NOTE   Subjective/Complaints:  Pt reports slept well- butt still hurting- up in w/c with OT- hasn't gotten hydrotherapy quite yet, but ordered.    ROS:   Pt denies SOB, abd pain, CP, N/V/C/D, and vision changes   Objective:   No results found. Recent Labs    11/26/20 0826  WBC 10.1  HGB 10.1*  HCT 30.9*  PLT 498*   Recent Labs    11/26/20 0826  NA 136  K 4.3  CL 106  CO2 21*  GLUCOSE 130*  BUN 24*  CREATININE 1.48*  CALCIUM 8.9    Intake/Output Summary (Last 24 hours) at 11/28/2020 0831 Last data filed at 11/27/2020 1919 Gross per 24 hour  Intake 600 ml  Output --  Net 600 ml     Pressure Injury 11/23/20 Coccyx Right;Lower;Medial Unstageable - Full thickness tissue loss in which the base of the injury is covered by slough (yellow, tan, gray, green or brown) and/or eschar (tan, brown or black) in the wound bed. Yellow area of slou (Active)  11/23/20 1836  Location: Coccyx  Location Orientation: Right;Lower;Medial  Staging: Unstageable - Full thickness tissue loss in which the base of the injury is covered by slough (yellow, tan, gray, green or brown) and/or eschar (tan, brown or black) in the wound bed.  Wound Description (Comments): Yellow area of slough  Present on Admission: Yes     Pressure Injury 11/23/20 Coccyx Left;Lower;Medial Unstageable - Full thickness tissue loss in which the base of the injury is covered by slough (yellow, tan, gray, green or brown) and/or eschar (tan, brown or black) in the wound bed. Thick yellow and bro (Active)  11/23/20 1836  Location: Coccyx  Location Orientation: Left;Lower;Medial  Staging: Unstageable - Full thickness tissue loss in which the base of the injury is covered by slough (yellow, tan, gray, green or brown) and/or eschar (tan, brown or black) in the wound bed.  Wound Description (Comments): Thick yellow and brown tissue. Unable to view healthy  tissue.  Present on Admission: Yes    Physical Exam: Vital Signs Blood pressure 104/70, pulse 75, temperature 98.8 F (37.1 C), temperature source Oral, resp. rate 18, height 5' 2.99" (1.6 m), weight 79.9 kg, SpO2 94 %.      General: awake, alert, appropriate, sitting up in bedside w/c; OTA in room; NAD HENT: conjugate gaze; oropharynx moist CV: regular rate; no JVD Pulmonary: CTA B/L; no W/R/R- good air movement GI: soft, NT, ND, (+)BS Psychiatric: appropriate- halting speech Neurological: alert; spasticity on L side as per below; dysarthric- stable Musculoskeletal:        General: no pain with knee ROM Knees swelling has resolved; no heat    Cervical back: Normal range of motion.  Skin:    General: Skin is warm. Wounds on backside- C/D/I covered Neurological:     Mental Status: He is alert and oriented to person, place, and time.    Comments: Dysarthric and measured speech. Left central 7 and tongue deviation. Reasonable insight and awareness. Functional memory. Normal language. LUE 1+ to 2/5 prox to distal. MAS 3wrist/finger flexors and biceps. LLE 3-/5 prox to 3/5 distally without  tone. Sensed pain  and light touch in all 4's    Assessment/Plan: 1. Functional deficits which require 3+ hours per day of interdisciplinary therapy in a comprehensive inpatient rehab setting. Physiatrist is providing close team supervision and 24 hour management of active medical problems listed below. Physiatrist and rehab team continue to assess barriers to discharge/monitor patient progress toward functional and medical goals  Care Tool:  Bathing    Body parts bathed by patient: Left arm, Chest, Abdomen, Front perineal area, Left upper leg, Right upper leg, Face   Body parts bathed by helper: Buttocks, Right lower leg, Left lower leg     Bathing assist Assist Level: Moderate Assistance - Patient 50 - 74%     Upper Body Dressing/Undressing Upper body dressing   What is the patient  wearing?: Pull over shirt    Upper body assist Assist Level: Supervision/Verbal cueing    Lower Body Dressing/Undressing Lower body dressing      What is the patient wearing?: Incontinence brief, Pants     Lower body assist Assist for lower body dressing: Moderate Assistance - Patient 50 - 74%     Toileting Toileting Toileting Activity did not occur (Clothing management and hygiene only): N/A (no void or bm)  Toileting assist Assist for toileting: Moderate Assistance - Patient 50 - 74%     Transfers Chair/bed transfer  Transfers assist     Chair/bed transfer assist level: Minimal Assistance - Patient > 75%     Locomotion Ambulation   Ambulation assist   Ambulation activity did not occur: Safety/medical concerns          Walk 10 feet activity   Assist  Walk 10 feet activity did not occur: Safety/medical concerns        Walk 50 feet activity   Assist Walk 50 feet with 2 turns activity did not occur: Safety/medical concerns         Walk 150 feet activity   Assist Walk 150 feet activity did not occur: Safety/medical concerns         Walk 10 feet on uneven surface  activity   Assist Walk 10 feet on uneven surfaces activity did not occur: Safety/medical concerns         Wheelchair     Assist Is the patient using a wheelchair?: Yes Type of Wheelchair: Manual    Wheelchair assist level: Supervision/Verbal cueing Max wheelchair distance: 46'    Wheelchair 50 feet with 2 turns activity    Assist        Assist Level: Supervision/Verbal cueing   Wheelchair 150 feet activity     Assist      Assist Level: Maximal Assistance - Patient 25 - 49%   Blood pressure 104/70, pulse 75, temperature 98.8 F (37.1 C), temperature source Oral, resp. rate 18, height 5' 2.99" (1.6 m), weight 79.9 kg, SpO2 94 %.  Medical Problem List and Plan: 1.  Functional deficits secondary to debility related to new onset a fib and multiple  medical             -patient may shower             -ELOS/Goals: 12-14 days, min assist at w/c level  D/c date set for 11/29  Con't PT and OT/CIR- wound on backside biggest limiter 2.  Antithrombotics: -DVT/anticoagulation:  Mechanical: Sequential compression devices, below knee Bilateral lower extremities             -antiplatelet therapy: N/a 3. Pain Management/spasticity: tylenol # 3 prn for  pain.              -pt followed by [facility], has had botox in past. Looks as if these are needed again to wrist and finger flexors  11/15- will have pt see Dr Letta Pate for Botox after d/c.  4. Mood: LCSW to follow for evaluation and support.              -antipsychotic agents: N/A 5. Neuropsych: This patient is capable of making decisions on his own behalf. 6. Skin/Wound Care: Routine pressure relief Mes[place].              --added beneprotein.  7. Fluids/Electrolytes/Nutrition: strict I/O. Check lytes in am.              --Monitor renal status with serial checks.  8. A fib new onset: Monitor HR TID and for symptoms with increase in activity.             --continue Coreg, Cardizem and amiodarone             --Plans for repeat EGD in 8 weeks for clearance on addition of DOAC  11/16- stable- regular rate- con't regimen 9. Gout flare: Continue Colchicine daily for now. Pt says it's improving.  11/12-flared-- Prednisone 30 mg daily x 4 days-    11/13 pt reports improvement today--wean down to 20mg  daily x 2 more days then stop  11/16- Sx's resolved as of 11/15- off Prednisone 10. Acute on chronic AKI?: Due to NSAIDS for gout flare. Has resolved with SCr -1.3? Baseline .             -- 1.4 11/12--near baseline---continue to follow  11/15- Cr up slightly to 1.48- but baseline is 1.3 - will have pt push fluids and recheck Thursday.   11/16- encouraged pt to push fluids so kidney function is better/won't have to do IVFs- will see labs in AM 11. T2DM: Hgb A1c-6.7. Monitor BS ac/hs and use SSI for elevated  BS.  CBG (last 3)  Recent Labs    11/27/20 1652 11/27/20 2031 11/28/20 0624  GLUCAP 132* 126* 129*                 --Added CM restrictions. Continue to hold metformin 11/16- BG's 120s-130s- con't regimen- off metformin 12. Chronic diastolic CHF: Monitor I/O. Check daily weights.  --Will monitor for signs of overload. On Lipitor.              --Ace on hold due to recent AKI.   11/16- Weight down 2 kg in last 1 week- con't to monitor 13. Multiple gastric/colonic ulcers: On PPI BID.             --monitor for signs of bleeding.              --Anemia stable. -11/12- HB 11.1- will con't to monitor  14.  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  Loose stools likely colchicine, gout flare responding to prednisone , will d/c colchicine   11/15- doing better-con't to monitor 16. Pressure ulcers on backside  11/15- has unstageable full thickness on coccyx - will con't local wound care- if doesn't improve, will call [facility]- seen by our WTA.   11/16- hydrotherapy ordered- will see when started- see if helps break up eschar- pain issues- con't prn meds  LOS: 5 days A FACE TO FACE EVALUATION WAS PERFORMED  Fatumata Kashani 11/28/2020, 8:31 AM

## 2020-11-28 NOTE — Progress Notes (Signed)
Patient had hydrotherapy this AM. Patient requested Tylenol 3 prior to therapy. Patient still C/O buttock pain 8/10  P. Love NP notified of patients increased pain. One time now order  Oxy 5 mg and Percocet 5-325. Patient repositioned call light and personal items within reach. Safety maintained.

## 2020-11-28 NOTE — Progress Notes (Signed)
Physical Therapy Session Note  Patient Details  Name: [name] MRN: [id number] Date of Birth: November 23, 1965  Today's Date: 11/28/2020 PT Individual Time:  -      Short Term Goals: Week 1:  PT Short Term Goal 1 (Week 1): Pt will transfer sidelying to sitting w/ min A from flat bed. PT Short Term Goal 2 (Week 1): Pt will transfer sit to stand w/ mod A. PT Short Term Goal 3 (Week 1): Pt will transfer bed <> w/c w/o lifting equipment. PT Short Term Goal 4 (Week 1): Pt will negotiate w/c w/ CGA 75'  Skilled Therapeutic Interventions/Progress Updates: Pt refusing PT intervention due to "not wanting to mess up butt". Per pt received hydrotherapy this am (?), explained that hydro and PT different and important to participate in PT rehab in order to go home. Pt and PTA went in circles for approx 15 min as PTA explained wound is covered and able to get back into bed once completed. Pt continues to refuse any intervention. Pt missed 60 min skilled PT at this time.      Therapy Documentation Precautions:  Precautions Precautions: Fall Precaution Comments: H/o CVA with residual L hemiparesis; dysarthric bowel incontinence Required Braces or Orthoses:  (L resting hand spint at baseline (variable schedule)) Restrictions Weight Bearing Restrictions: No General:   Vital Signs:   Pain: Pain Assessment Pain Scale: 0-10 Pain Score: 0-No pain     Therapy/Group: Individual Therapy  Dreonna Hussein Jayvin Hurrell, PTA  11/28/2020, 9:46 AM

## 2020-11-29 LAB — GLUCOSE, CAPILLARY
Glucose-Capillary: 138 mg/dL — ABNORMAL HIGH (ref 70–99)
Glucose-Capillary: 108 mg/dL — ABNORMAL HIGH (ref 70–99)
Glucose-Capillary: 118 mg/dL — ABNORMAL HIGH (ref 70–99)
Glucose-Capillary: 119 mg/dL — ABNORMAL HIGH (ref 70–99)

## 2020-11-29 MED ORDER — SODIUM CHLORIDE 0.9 % IV SOLN: INTRAVENOUS | Status: AC

## 2020-11-29 MED ORDER — HYDROCODONE-ACETAMINOPHEN 5-325 MG PO TABS: 1.0000 | ORAL_TABLET | Freq: Four times a day (QID) | ORAL | Status: DC | PRN

## 2020-11-29 NOTE — Progress Notes (Signed)
Occupational Therapy Session Note  Patient Details  Name: [name] MRN: [id number] Date of Birth: May 22, 1965  Today's Date: 11/29/2020 OT Individual Time: 3361-2244 OT Individual Time Calculation (min): 71 min    Short Term Goals: Week 2:  OT Short Term Goal 1 (Week 2): Patient will complete 2/3 parts of toileting task with Min A and lateral leans. OT Short Term Goal 2 (Week 2): Patient will complete squat-pivot transfers with Min A. OT Short Term Goal 3 (Week 2): Pt will compete LB dressing with mod A sit<>stand/lateral leans  Skilled Therapeutic Interventions/Progress Updates:    Pt resting in bed upon arrival. Pt eating breakfast. Pt requested to use BSC and sat EOB with supervision using bed rails and HOB elevated. Squat pivot tranfser to [facility] with max A for sequencing. Pt voided and required max A for toileting tasks. Pt transferred to w/c with St[place] (CGA for sit<>stand in [place].) Bathing with mod A. UB dressing with supervision. LB dressing with max A. Pt able to thread BLE into pants but requires assistance for pulling pants over hips and donning socks/shoes. Pt remained in w/c with belt alarm activated and all needs within reach.   Therapy Documentation Precautions:  Precautions Precautions: Fall Precaution Comments: H/o CVA with residual L hemiparesis; dysarthric bowel incontinence Required Braces or Orthoses:  (L resting hand spint at baseline (variable schedule)) Restrictions Weight Bearing Restrictions: No Pain:  Pt denies pain this morning     Therapy/Group: Individual Therapy  [name] 11/29/2020, 8:18 AM

## 2020-11-29 NOTE — Progress Notes (Signed)
Occupational Therapy Weekly Progress Note  Patient Details  Name: [name] MRN: [id number] Date of Birth: 07/23/1965  Beginning of progress report period: November 24, 2020 End of progress report period: November 29, 2020  Patient has met 1 of 3 short term goals.  Pt is making slow but steady progress with BADLs and functional transfers. Squat pivot transfers with max/mod A at this time with extra time to initiate and sequence. Sit<>stand in [place] with CGA but mod/max A with RW. UB dressing with setup/supervision. Bathing with mod A seated in w/c. LB dressing with max A. Pt's wife has not been present for OT therapy sessions. Family education scheduled for 11/21.  Patient continues to demonstrate the following deficits: muscle weakness, decreased cardiorespiratoy endurance, impaired timing and sequencing and unbalanced muscle activation, and decreased sitting balance, decreased standing balance, and decreased balance strategies and therefore will continue to benefit from skilled OT intervention to enhance overall performance with BADL and Reduce care partner burden.  Patient progressing toward long term goals..  Continue plan of care.  OT Short Term Goals Week 1:  OT Short Term Goal 1 (Week 1): Patient will don UB clothing with set-up assist. OT Short Term Goal 1 - Progress (Week 1): Met OT Short Term Goal 2 (Week 1): Patient will complete 2/3 parts of toileting task with Min A and lateral leans. OT Short Term Goal 2 - Progress (Week 1): Progressing toward goal OT Short Term Goal 3 (Week 1): Patient will complete squat-pivot transfers with Min A. OT Short Term Goal 3 - Progress (Week 1): Progressing toward goal Week 2:  OT Short Term Goal 1 (Week 2): Patient will complete 2/3 parts of toileting task with Min A and lateral leans. OT Short Term Goal 2 (Week 2): Patient will complete squat-pivot transfers with Min A. OT Short Term Goal 3 (Week 2): Pt will compete LB dressing with mod A  sit<>stand/lateral leans        [name] 11/29/2020, 6:45 AM

## 2020-11-29 NOTE — Progress Notes (Signed)
Occupational Therapy Session Note  Patient Details  Name: [name] MRN: [id number] Date of Birth: December 29, 1965  Today's Date: 11/29/2020 OT Individual Time: 0109-3235 OT Individual Time Calculation (min): 30 min ( make up time)   Short Term Goals: Week 2:  OT Short Term Goal 1 (Week 2): Patient will complete 2/3 parts of toileting task with Min A and lateral leans. OT Short Term Goal 2 (Week 2): Patient will complete squat-pivot transfers with Min A. OT Short Term Goal 3 (Week 2): Pt will compete LB dressing with mod A sit<>stand/lateral leans  Skilled Therapeutic Interventions/Progress Updates:  Pt greeted seated in w/c agreeable to OT intervention. Session focus on  BUE strength and endurance to increase overall activity tolerance for ADL participation. Pt completed seated AAROM therex as indicated below with un weighted dowel rod, LUE secured to dowel with coband 2x10 bicep curls 2x10 chest presses 2x10 forward rows 2x10 backwards rows pt left seated in w/c with alarm belt activated and all needs within reach.                   Therapy Documentation Precautions:  Precautions Precautions: Fall Precaution Comments: H/o CVA with residual L hemiparesis; dysarthric bowel incontinence Required Braces or Orthoses:  (L resting hand spint at baseline (variable schedule)) Restrictions Weight Bearing Restrictions: No    Pain: No pain reported during session   Therapy/Group: Individual Therapy  Corinne Ports [facility] 11/29/2020, 9:58 AM

## 2020-11-29 NOTE — Progress Notes (Signed)
Occupational Therapy Session Note  Patient Details  Name: [name] MRN: [id number] Date of Birth: 1965-09-25  Today's Date: 11/29/2020 OT Group Time: 1410-1455 OT Group Time Calculation (min): 45 min   Short Term Goals: Week 2:  OT Short Term Goal 1 (Week 2): Patient will complete 2/3 parts of toileting task with Min A and lateral leans. OT Short Term Goal 2 (Week 2): Patient will complete squat-pivot transfers with Min A. OT Short Term Goal 3 (Week 2): Pt will compete LB dressing with mod A sit<>stand/lateral leans  Skilled Therapeutic Interventions/Progress Updates:  Pt participated in group session with a focus on stress mgmt, education on healthy coping strategies, and social interaction. Focus of session on providing coping strategies to manage new current level of function as a result of new diagnosis.  Session focus on breaking down stressors into "daily hassles," "major life stressors" and "life circumstances" in an effort to allow pts to chunk their stressors into groups. Pt actively sharing stressors and contributing to group conversation. Provided active listening, emotional support and therapeutic use of self. Offered education on factors that protect [place] against stress such as "daily uplifts," "healthy coping strategies" and "protective factors." Encouraged all group members to make an effort to actively recall one event from their day that was a daily uplift in an effort to protect their mindset from stressors as well as sharing this information with their caregivers to facilitate improved caregiver communication and decrease overall burden of care.  Issued pt handouts on healthy coping strategies to implement into routine. Pt transported back to room by RT.  Therapy Documentation Precautions:  Precautions Precautions: Fall Precaution Comments: H/o CVA with residual L hemiparesis; dysarthric bowel incontinence Required Braces or Orthoses:  (L resting hand spint at  baseline (variable schedule)) Restrictions Weight Bearing Restrictions: No  Pain: Pt reports no pain during session    Therapy/Group: Group Therapy  Precious Haws 11/29/2020, 4:19 PM

## 2020-11-29 NOTE — Progress Notes (Signed)
PROGRESS NOTE   Subjective/Complaints:  Pt reports more pain with hydrotherapy  Otherwise, no issues this AM.  ROS:   Pt denies SOB, abd pain, CP, N/V/C/D, and vision changes   Objective:   No results found. Recent Labs    11/26/20 0826  WBC 10.1  HGB 10.1*  HCT 30.9*  PLT 498*   Recent Labs    11/26/20 0826  NA 136  K 4.3  CL 106  CO2 21*  GLUCOSE 130*  BUN 24*  CREATININE 1.48*  CALCIUM 8.9    Intake/Output Summary (Last 24 hours) at 11/29/2020 0819 Last data filed at 11/29/2020 0559 Gross per 24 hour  Intake 300 ml  Output 675 ml  Net -375 ml     Pressure Injury 11/23/20 Coccyx Right;Lower;Medial Unstageable - Full thickness tissue loss in which the base of the injury is covered by slough (yellow, tan, gray, green or brown) and/or eschar (tan, brown or black) in the wound bed. Yellow area of slou (Active)  11/23/20 1836  Location: Coccyx  Location Orientation: Right;Lower;Medial  Staging: Unstageable - Full thickness tissue loss in which the base of the injury is covered by slough (yellow, tan, gray, green or brown) and/or eschar (tan, brown or black) in the wound bed.  Wound Description (Comments): Yellow area of slough  Present on Admission: Yes     Pressure Injury 11/23/20 Coccyx Left;Lower;Medial Unstageable - Full thickness tissue loss in which the base of the injury is covered by slough (yellow, tan, gray, green or brown) and/or eschar (tan, brown or black) in the wound bed. Thick yellow and bro (Active)  11/23/20 1836  Location: Coccyx  Location Orientation: Left;Lower;Medial  Staging: Unstageable - Full thickness tissue loss in which the base of the injury is covered by slough (yellow, tan, gray, green or brown) and/or eschar (tan, brown or black) in the wound bed.  Wound Description (Comments): Thick yellow and brown tissue. Unable to view healthy tissue.  Present on Admission: Yes     Physical Exam: Vital Signs Blood pressure 118/66, pulse 80, temperature 98.1 F (36.7 C), resp. rate 18, height 5' 2.99" (1.6 m), weight 79.8 kg, SpO2 99 %.       General: awake, alert, appropriate, sitting up in bedside chair- OTA in room; getting bathed/set up;  NAD HENT: conjugate gaze; oropharynx moist CV: regular rate; no JVD Pulmonary: CTA B/L; no W/R/R- good air movement GI: soft, NT, ND, (+)BS Psychiatric: appropriate Neurological: alert- spasticity on L side stable  Musculoskeletal:        General: no pain with knee ROM Knees swelling has resolved; no heat    Cervical back: Normal range of motion.  Skin:    General: Skin is warm. Wounds on backside- C/D/I covered Neurological:     Mental Status: He is alert and oriented to person, place, and time.    Comments: Dysarthric and measured speech. Left central 7 and tongue deviation. Reasonable insight and awareness. Functional memory. Normal language. LUE 1+ to 2/5 prox to distal. MAS 3wrist/finger flexors and biceps. LLE 3-/5 prox to 3/5 distally without  tone. Sensed pain and light touch in all 4's    Assessment/Plan: 1.  Functional deficits which require 3+ hours per day of interdisciplinary therapy in a comprehensive inpatient rehab setting. Physiatrist is providing close team supervision and 24 hour management of active medical problems listed below. Physiatrist and rehab team continue to assess barriers to discharge/monitor patient progress toward functional and medical goals  Care Tool:  Bathing    Body parts bathed by patient: Left arm, Chest, Abdomen, Front perineal area, Left upper leg, Right upper leg, Face   Body parts bathed by helper: Buttocks, Right lower leg, Left lower leg     Bathing assist Assist Level: Moderate Assistance - Patient 50 - 74%     Upper Body Dressing/Undressing Upper body dressing   What is the patient wearing?: Pull over shirt    Upper body assist Assist Level:  Supervision/Verbal cueing    Lower Body Dressing/Undressing Lower body dressing      What is the patient wearing?: Incontinence brief, Pants     Lower body assist Assist for lower body dressing: Moderate Assistance - Patient 50 - 74%     Toileting Toileting Toileting Activity did not occur (Clothing management and hygiene only): N/A (no void or bm)  Toileting assist Assist for toileting: Moderate Assistance - Patient 50 - 74%     Transfers Chair/bed transfer  Transfers assist     Chair/bed transfer assist level: Moderate Assistance - Patient 50 - 74%     Locomotion Ambulation   Ambulation assist   Ambulation activity did not occur: Safety/medical concerns          Walk 10 feet activity   Assist  Walk 10 feet activity did not occur: Safety/medical concerns        Walk 50 feet activity   Assist Walk 50 feet with 2 turns activity did not occur: Safety/medical concerns         Walk 150 feet activity   Assist Walk 150 feet activity did not occur: Safety/medical concerns         Walk 10 feet on uneven surface  activity   Assist Walk 10 feet on uneven surfaces activity did not occur: Safety/medical concerns         Wheelchair     Assist Is the patient using a wheelchair?: Yes Type of Wheelchair: Manual    Wheelchair assist level: Supervision/Verbal cueing Max wheelchair distance: 64'    Wheelchair 50 feet with 2 turns activity    Assist        Assist Level: Supervision/Verbal cueing   Wheelchair 150 feet activity     Assist      Assist Level: Maximal Assistance - Patient 25 - 49%   Blood pressure 118/66, pulse 80, temperature 98.1 F (36.7 C), resp. rate 18, height 5' 2.99" (1.6 m), weight 79.8 kg, SpO2 99 %.  Medical Problem List and Plan: 1.  Functional deficits secondary to debility related to new onset a fib and multiple medical             -patient may shower             -ELOS/Goals: 12-14 days, min  assist at w/c level  D/c date set for 11/29  Con't PT and OT/CIR- wound is biggest limiter 2.  Antithrombotics: -DVT/anticoagulation:  Mechanical: Sequential compression devices, below knee Bilateral lower extremities             -antiplatelet therapy: N/a 3. Pain Management/spasticity: tylenol # 3 prn for pain.              -  pt followed by [facility], has had botox in past. Looks as if these are needed again to wrist and finger flexors  11/15- will have pt see Dr Letta Pate for Botox after d/c.   11/17- will add Norco as needed for severe pain after hydrotherapy.  4. Mood: LCSW to follow for evaluation and support.              -antipsychotic agents: N/A 5. Neuropsych: This patient is capable of making decisions on his own behalf. 6. Skin/Wound Care: Routine pressure relief Mesures.              --added beneprotein.  7. Fluids/Electrolytes/Nutrition: strict I/O. Check lytes in am.              --Monitor renal status with serial checks.  8. A fib new onset: Monitor HR TID and for symptoms with increase in activity.             --continue Coreg, Cardizem and amiodarone             --Plans for repeat EGD in 8 weeks for clearance on addition of DOAC  11/16- stable- regular rate- con't regimen 9. Gout flare: Continue Colchicine daily for now. Pt says it's improving.  11/12-flared-- Prednisone 30 mg daily x 4 days-    11/13 pt reports improvement today--wean down to 20mg  daily x 2 more days then stop  11/16- Sx's resolved as of 11/15- off Prednisone 10. Acute on chronic AKI?: Due to NSAIDS for gout flare. Has resolved with SCr -1.3? Baseline .             -- 1.4 11/12--near baseline---continue to follow  11/15- Cr up slightly to 1.48- but baseline is 1.3 - will have pt push fluids and recheck Thursday.   11/16- encouraged pt to push fluids so kidney function is better/won't have to do IVFs- will see labs in AM  11/17- will give IVFs 75cc/hour x 24 hours due to renal issues 11. T2DM: Hgb A1c-6.7.  Monitor BS ac/hs and use SSI for elevated BS.  CBG (last 3)  Recent Labs    11/28/20 1640 11/28/20 2118 11/29/20 0554  GLUCAP 140* 128* 138*                 --Added CM restrictions. Continue to hold metformin 11/16- BG's 120s-130s- con't regimen- off metformin 12. Chronic diastolic CHF: Monitor I/O. Check daily weights.  --Will monitor for signs of overload. On Lipitor.              --Ace on hold due to recent AKI.   11/16- Weight down 2 kg in last 1 week- con't to monitor 13. Multiple gastric/colonic ulcers: On PPI BID.             --monitor for signs of bleeding.              --Anemia stable. -11/12- HB 11.1- will con't to monitor  14.  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  Loose stools likely colchicine, gout flare responding to prednisone , will d/c colchicine   11/15- doing better-con't to monitor 16. Pressure ulcers on backside  11/15- has unstageable full thickness on coccyx - will con't local wound care- if doesn't improve, will call WOC- seen by our WTA.   11/16- hydrotherapy ordered- will see when started- see if helps break up eschar- pain issues- con't prn meds  11/17- got Hydrotherapy yesterday- had increased buttock pain-  might need to increase pain meds with hydrotherapy-   LOS: 6 days A FACE TO FACE EVALUATION WAS PERFORMED  Vung Kush 11/29/2020, 8:19 AM

## 2020-11-29 NOTE — Progress Notes (Signed)
Physical Therapy Session Note  Patient Details  Name: [name] MRN: [id number] Date of Birth: [birth year]  Today's Date: 11/29/2020 PT Individual Time: 1005-1100 PT Individual Time Calculation (min): 55 min   Short Term Goals: Week 1:  PT Short Term Goal 1 (Week 1): Pt will transfer sidelying to sitting w/ min A from flat bed. PT Short Term Goal 2 (Week 1): Pt will transfer sit to stand w/ mod A. PT Short Term Goal 3 (Week 1): Pt will transfer bed <> w/c w/o lifting equipment. PT Short Term Goal 4 (Week 1): Pt will negotiate w/c w/ CGA 75'  Skilled Therapeutic Interventions/Progress Updates: Pt presented in w/c agreeable to therapy. Pt states pain manageable at the moment 6/10 at buttocks and premedicated. Will receive hydrotherapy after this PT session. Pt then transported to 4th floor ortho gym total A for time management. Pt performed squat pivot transfer to high/low mat with minA but required x 3 lateral scoots performed at supervision to square up to mat. Pt then participated in unsupported sitting activities for core activation and general sitting tolerance. Pt participated in 2 bouts of ball taps x 30 each with min to mod reaches of RUE. Pt also participated in x 2 bouts of horseshoes with same moderate reaches. Pt then performed squat pivot transfer to L back to w/c requiring modA. Pt then propelled w/c 80f with overall supervision however required x 1 assist when pushing over threshold due to generalized weakness. Pt noted to propel via hemi-technique however required significantly increased time to perform activity. Once at elevators pt transported remaining distance back to room. In room pt performed squat pivot transfer to bed with modA. Pt was able to perform sit to supine with CGA and increased time with use of bed features. Pt then performed rolling L/R with supervision to allow PTA to readjust chux. Pt left in bed sidelying per pt request. Pt left with call bell within reach  and needs met.      Therapy Documentation Precautions:  Precautions Precautions: Fall Precaution Comments: H/o CVA with residual L hemiparesis; dysarthric bowel incontinence Required Braces or Orthoses:  (L resting hand spint at baseline (variable schedule)) Restrictions Weight Bearing Restrictions: No General:   Vital Signs: Therapy Vitals Temp: 98.7 F (37.1 C) Temp Source: Oral Pulse Rate: 78 Resp: 16 BP: 113/66 Patient Position (if appropriate): Lying Oxygen Therapy SpO2: 95 % O2 Device: Room Air Pain:   Mobility:   Locomotion :    Trunk/Postural Assessment :    Balance:   Exercises:   Other Treatments:      Therapy/Group: Individual Therapy  Nazier Neyhart 11/29/2020, 3:35 PM

## 2020-11-29 NOTE — Progress Notes (Signed)
Patient ID: [name], male   DOB: January 11, 1966, 55 y.o.   MRN: [id number]  Pt HHPT/OT/SN referral was declined by Cory[facility].  *referral accepted by Angela[facility].   Loralee Pacas, MSW, [place] Office: [phone] Cell: [phone] Fax: [phone]

## 2020-11-29 NOTE — Progress Notes (Signed)
Physical Therapy Wound Treatment Patient Details  Name: [name] MRN: [id number] Date of Birth: 1965/03/03 Today's Date: 11/29/2020 PT Individual Time: 2725-3664 PT Individual Time Calculation (min): 20 min   Subjective  Patient and Family Stated Goals: pt didn't state Date of Onset:  (unknow) Prior Treatments:  (unknown)  Pain Score:  6-8/10  premedicated, but will talk about needing more.  Wound Assessment  Pressure Injury 11/23/20 Coccyx Right;Lower;Medial Unstageable - Full thickness tissue loss in which the base of the injury is covered by slough (yellow, tan, gray, green or brown) and/or eschar (tan, brown or black) in the wound bed. Yellow area of slou (Active)  Wound Image   11/28/20 1556  Dressing Type Barrier Film (skin prep);Foam - Lift dressing to assess site every shift;Gauze (Comment);Moist to dry;Santyl;Normal saline moist dressing 11/29/20 1155  Dressing Changed;Clean;Dry;Intact 11/29/20 1155  Dressing Change Frequency Daily 11/29/20 1155  State of Healing Eschar 11/29/20 1155  Site / Wound Assessment Clean;Pink;Yellow 11/29/20 1155  % Wound base Red or Granulating 5% 11/29/20 1155  % Wound base Yellow/Fibrinous Exudate 95% 11/29/20 1155  % Wound base Black/Eschar 0% 11/29/20 1155  % Wound base Other/Granulation Tissue (Comment) 0% 11/29/20 1155  Peri-wound Assessment Intact 11/29/20 1155  Wound Length (cm) 5 cm 11/28/20 1556  Wound Width (cm) 3.5 cm 11/28/20 1556  Wound Depth (cm) 3 cm 11/28/20 1556  Wound Surface Area (cm^2) 17.5 cm^2 11/28/20 1556  Wound Volume (cm^3) 52.5 cm^3 11/28/20 1556  Margins Unattached edges (unapproximated) 11/29/20 1155  Drainage Amount Minimal 11/29/20 1155  Drainage Description Serous 11/29/20 1155  Treatment Cleansed;Debridement (Selective);Hydrotherapy (Pulse lavage);Packing (Saline gauze) 11/29/20 1155      Hydrotherapy Pulsed lavage therapy - wound location: medial coccyx to R buttock Pulsed Lavage with Suction  (psi): 12 psi Pulsed Lavage with Suction - Normal Saline Used: 1000 mL Pulsed Lavage Tip: Tip with splash shield Selective Debridement Selective Debridement - Location: coccyx to r buttock Selective Debridement - Tools Used: Forceps;Scalpel Selective Debridement - Tissue Removed: yellow eschar   Wound Assessment and Plan  Wound Therapy - Assess/Plan/Recommendations Wound Therapy - Clinical Statement: pt will benefit for Pulsed Lavage to soften eschar for selective debridement and decrease the bioburden in the wound. Wound Therapy - Functional Problem List: functional immobility Factors Delaying/Impairing Wound Healing: Altered sensation;Infection - systemic/local;Immobility Hydrotherapy Plan: Debridement;Dressing change;Patient/family education;Pulsatile lavage with suction Wound Therapy - Frequency: 6X / week Wound Therapy - Current Recommendations: PT Wound Therapy - Follow Up Recommendations: dressing changes by RN  Wound Therapy Goals- Improve the function of patient's integumentary system by progressing the wound(s) through the phases of wound healing (inflammation - proliferation - remodeling) by: Decrease Necrotic Tissue to: 20 % Decrease Necrotic Tissue - Progress: Progressing toward goal Increase Granulation Tissue to: 80% Increase Granulation Tissue - Progress: Progressing toward goal Goals/treatment plan/discharge plan were made with and agreed upon by patient/family: Yes Time For Goal Achievement: 7 days Wound Therapy - Potential for Goals: Good  Goals will be updated until maximal potential achieved or discharge criteria met.  Discharge criteria: when goals achieved, discharge from hospital, MD decision/surgical intervention, no progress towards goals, refusal/missing three consecutive treatments without notification or medical reason.  GP     Tessie Fass Saretta Dahlem 11/29/2020, 12:02 PM 11/29/2020  Ginger Carne., PT Acute Rehabilitation Services [phone]   (pager) [phone]  (office)

## 2020-11-29 NOTE — Evaluation (Signed)
Recreational Therapy Assessment and Plan  Patient Details  Name: [name] MRN: [id number] Date of Birth: 06-10-1965 Today's Date: 11/29/2020  Rehab Potential:  Good ELOS:   11/29  Assessment   Hospital Problem: Principal Problem:   Debility     Past Medical History:      Past Medical History:  Diagnosis Date   Colon cancer ([place]) 2009    rectal   Diabetes mellitus without complication ([place])     Gait abnormality 03/31/2018   Hemiparesis and speech and language deficit as late effects of stroke ([place]) 03/31/2018   Hypertension     Seizure ([place])     Stroke ([place]) 2018    Past Surgical History:       Past Surgical History:  Procedure Laterality Date   APPENDECTOMY       BIOPSY   11/18/2020    Procedure: BIOPSY;  Surgeon: Sharyn Creamer, MD;  Location: [facility];  Service: Gastroenterology;;   COLONOSCOPY N/A 11/18/2020    Procedure: COLONOSCOPY;  Surgeon: Sharyn Creamer, MD;  Location: [facility];  Service: Gastroenterology;  Laterality: N/A;   ESOPHAGOGASTRODUODENOSCOPY (EGD) WITH PROPOFOL N/A 11/18/2020    Procedure: ESOPHAGOGASTRODUODENOSCOPY (EGD) WITH PROPOFOL;  Surgeon: Sharyn Creamer, MD;  Location: [facility];  Service: Gastroenterology;  Laterality: N/A;   JOINT REPLACEMENT Left 2004    Left hip   POLYPECTOMY   11/18/2020    Procedure: POLYPECTOMY;  Surgeon: Sharyn Creamer, MD;  Location: [facility] ENDOSCOPY;  Service: Gastroenterology;;   TOTAL HIP ARTHROPLASTY          Assessment & Plan Clinical Impression:  [name] is a 55 year old male with history of HTN, CVA with left hemiplegia, T2DM, seizure disorder, colon cancer, recent gout flare who was admitted on 11/14/20 /with complaints of left-sided and fall while trying to transfer out of his wheelchair.  He was found to have A. fib with RVR and was started on Cardizem drip for rate control and converted to NSR.  CT chest/abdomen/pelvis was negative for acute abnormality and cholelithiasis without  inflammation.  2D echo showed EF 60 to 65% with mild concentric LVH and mild biatrial dilatation. He was started IVF for AKI with SCr- 2.9. Dr. Doylene Canard consulted for input and Coreg added for rate control with recommendations on initiating anticoagulation.     He was started on IV heparin but  developed acute blood loss anemia with drop in hemoglobin from 11.3-9.5.  EGD/colonoscopy ordered for work-up but patient developed nausea vomiting with recurrent PAF and heart rates from 40-160. KUB done for work-up and revealed abdominal distention and showed marked gaseous distention of stomach with air in nondilated large and small bowel to the level of sigmoid. Patient had recurrent A. fib with RVR requiring IV amiodarone and was transitioned to po by 11/05.    UGI/Colonoscopy by Dr. Lorenso Courier on 11/06 revealed many linear esophageal ulcers without bleeding or stigmata of recent bleeding, erythematous mucosa in gastric antrum, multiple ulcers at hepatic flexure of ascending colon and cecum, 4 mm polyps, nonbleeding internal hemorrhoids..  She felt that anemia was likely due to esophageal or colonic ulcers which were felt to be ischemic in nature and.  PPI recommended for treatment of esophageal ulcers. Biopsies taken and revealed chronic gastritis, negative for H. pylori, mild chronic inflammation of esophagus, colonic mucosa with healed ulcer and tubular adenoma on rectal biopsy. To postpone DOAC for 8 weeks pending repeat EGD. Cholchicine was added on 11/06 due ongoing pain from polyarticular  gout. He has had issues with tachypnea and hypotension in the past 24 hours.  Therapy ongoing and patient noted to debilitated with decline in mobility and ability to carry out ADLs.  At baseline, patient was  independent for squat pivot transfers,used WC for mobility and was able to complete ADLs with AE/assist. CIR recommended due to functional decline. Patient transferred to CIR on 11/23/2020 .     Pt presents with decreased  activity tolerance, decreased functional mobility, decreased balance, left inattention, left hemiparesis, dysarthria, feelings of stress/anxiety Limiting pt's independence with leisure/community pursuits.  Met with pt today to discuss leisure education, activity analysis/modifications and stress management.  Pt appreciative of information and plans to implement.  Pt seen for a second session for a stress management group. Pt participated in stress managment/coping group today.  Pt education/discussion focused on stress exploration including factors that contribute to stress, factors that protect against stress and potential coping strategies.  Coping strategies included deep breathing, progressive muscle relaxation, imagery & challenging irrational thoughts.  Handouts provided.  Pt actively participated in group discussion and appreciative of this information.    Plan  No further TR  Recommendations for other services: None   Discharge Criteria: Patient will be discharged from TR if patient refuses treatment 3 consecutive times without medical reason.  If treatment goals not met, if there is a change in medical status, if patient makes no progress towards goals or if patient is discharged from hospital.  The above assessment, treatment plan, treatment alternatives and goals were discussed and mutually agreed upon: by patient  [place] 11/29/2020, 4:18 PM

## 2020-11-29 NOTE — Discharge Instructions (Addendum)
   Inpatient Rehab Discharge Instructions  [name] Discharge date and time:  12/13/20  Activities/Precautions/ Functional Status: Activity: no lifting, driving, or strenuous exercise till cleared by MD Diet: cardiac diet Wound Care:     Functional status:  ___ No restrictions     ___ Walk up steps independently _X__ 24/7 supervision/assistance   ___ Walk up steps with assistance ___ Intermittent supervision/assistance  ___ Bathe/dress independently ___ Walk with walker     _X__ Bathe/dress with assistance ___ Walk Independently    ___ Shower independently ___ Walk with assistance    ___ Shower with assistance _X__ No alcohol     ___ Return to work/school ________   Special Instructions: Monitor weights daily. Need to increase protein intake--at least 2-3 times a day between meals.   [facility] REFERRALS UPON DISCHARGE:    Home Health:   PT     OT     RN                    Agency: [facility]  Phone: [phone] *Please expect follow-up within 2-3 days to schedule your home visit. If you have not received follow-up, be sure to make contact with the branch directly.*   Medical Equipment/Items Ordered: hospital bed with air mattress                                                 Agency/Supplier: [facility] [phone]  Medical Equipment/Items Ordered: specialty wheelchair and roho cushion (provided loaner w/c)                                                 Agency/Supplier: [facility] [phone]     My questions have been answered and I understand these instructions. I will adhere to these goals and the provided educational materials after my discharge from the hospital.  Patient/Caregiver Signature _______________________________ Date __________  Clinician Signature _______________________________________ Date __________  Please bring this form and your medication list with you to all your follow-up doctor's appointments.

## 2020-11-30 LAB — BASIC METABOLIC PANEL
Anion gap: 8 (ref 5–15)
BUN: 22 mg/dL — ABNORMAL HIGH (ref 6–20)
CO2: 19 mmol/L — ABNORMAL LOW (ref 22–32)
Calcium: 8.5 mg/dL — ABNORMAL LOW (ref 8.9–10.3)
Chloride: 109 mmol/L (ref 98–111)
Creatinine, Ser: 1.29 mg/dL — ABNORMAL HIGH (ref 0.61–1.24)
GFR, Estimated: >60 mL/min (ref >60)
Glucose, Bld: 132 mg/dL — ABNORMAL HIGH (ref 70–99)
Potassium: 4.9 mmol/L (ref 3.5–5.1)
Sodium: 136 mmol/L (ref 135–145)

## 2020-11-30 LAB — GLUCOSE, CAPILLARY
Glucose-Capillary: 133 mg/dL — ABNORMAL HIGH (ref 70–99)
Glucose-Capillary: 95 mg/dL (ref 70–99)
Glucose-Capillary: 139 mg/dL — ABNORMAL HIGH (ref 70–99)
Glucose-Capillary: 127 mg/dL — ABNORMAL HIGH (ref 70–99)

## 2020-11-30 NOTE — Progress Notes (Signed)
PROGRESS NOTE   Subjective/Complaints:  Pt doing good- no issues- on IVFs- will stop them, since Cr back down to 1.29- Bun down to 22 from 24  ROS:    Pt denies SOB, abd pain, CP, N/V/C/D, and vision changes   Objective:   No results found. No results for input(s): WBC, HGB, HCT, PLT in the last 72 hours.  Recent Labs    11/30/20 0504  NA 136  K 4.9  CL 109  CO2 19*  GLUCOSE 132*  BUN 22*  CREATININE 1.29*  CALCIUM 8.5*    Intake/Output Summary (Last 24 hours) at 11/30/2020 0958 Last data filed at 11/30/2020 0900 Gross per 24 hour  Intake 1734.89 ml  Output 525 ml  Net 1209.89 ml     Pressure Injury 11/23/20 Coccyx Right;Lower;Medial Unstageable - Full thickness tissue loss in which the base of the injury is covered by slough (yellow, tan, gray, green or brown) and/or eschar (tan, brown or black) in the wound bed. Yellow area of slou (Active)  11/23/20 1836  Location: Coccyx  Location Orientation: Right;Lower;Medial  Staging: Unstageable - Full thickness tissue loss in which the base of the injury is covered by slough (yellow, tan, gray, green or brown) and/or eschar (tan, brown or black) in the wound bed.  Wound Description (Comments): Yellow area of slough  Present on Admission: Yes     Pressure Injury 11/23/20 Coccyx Left;Lower;Medial Unstageable - Full thickness tissue loss in which the base of the injury is covered by slough (yellow, tan, gray, green or brown) and/or eschar (tan, brown or black) in the wound bed. Thick yellow and bro (Active)  11/23/20 1836  Location: Coccyx  Location Orientation: Left;Lower;Medial  Staging: Unstageable - Full thickness tissue loss in which the base of the injury is covered by slough (yellow, tan, gray, green or brown) and/or eschar (tan, brown or black) in the wound bed.  Wound Description (Comments): Thick yellow and brown tissue. Unable to view healthy tissue.   Present on Admission: Yes    Physical Exam: Vital Signs Blood pressure 132/79, pulse 79, temperature 99.5 F (37.5 C), temperature source Oral, resp. rate 17, height 5' 2.99" (1.6 m), weight 79.6 kg, SpO2 98 %.        General: awake, alert, appropriate, slumped in bed; feels comfortable;  NAD HENT: conjugate gaze; oropharynx moist CV: regular rate; no JVD Pulmonary: CTA B/L; no W/R/R- good air movement GI: soft, NT, ND, (+)BS- hypoactive Psychiatric: appropriate- dysarthric Neurological: alert Neurological: alert- spasticity on L side stable  Musculoskeletal:        General: no pain with knee ROM Knees swelling has resolved; no heat    Cervical back: Normal range of motion.  Skin:    General: Skin is warm. Wounds on backside- C/D/I covered Neurological:     Mental Status: He is alert and oriented to person, place, and time.    Comments: Dysarthric and measured speech. Left central 7 and tongue deviation. Reasonable insight and awareness. Functional memory. Normal language. LUE 1+ to 2/5 prox to distal. MAS 3wrist/finger flexors and biceps. LLE 3-/5 prox to 3/5 distally without  tone. Sensed pain and light touch in all  4's    Assessment/Plan: 1. Functional deficits which require 3+ hours per day of interdisciplinary therapy in a comprehensive inpatient rehab setting. Physiatrist is providing close team supervision and 24 hour management of active medical problems listed below. Physiatrist and rehab team continue to assess barriers to discharge/monitor patient progress toward functional and medical goals  Care Tool:  Bathing    Body parts bathed by patient: Left arm, Chest, Abdomen, Front perineal area, Left upper leg, Right upper leg, Face   Body parts bathed by helper: Buttocks, Right lower leg, Left lower leg     Bathing assist Assist Level: Moderate Assistance - Patient 50 - 74%     Upper Body Dressing/Undressing Upper body dressing   What is the patient  wearing?: Pull over shirt    Upper body assist Assist Level: Supervision/Verbal cueing    Lower Body Dressing/Undressing Lower body dressing      What is the patient wearing?: Incontinence brief, Pants     Lower body assist Assist for lower body dressing: Moderate Assistance - Patient 50 - 74%     Toileting Toileting Toileting Activity did not occur (Clothing management and hygiene only): N/A (no void or bm)  Toileting assist Assist for toileting: Moderate Assistance - Patient 50 - 74%     Transfers Chair/bed transfer  Transfers assist     Chair/bed transfer assist level: Moderate Assistance - Patient 50 - 74%     Locomotion Ambulation   Ambulation assist   Ambulation activity did not occur: Safety/medical concerns          Walk 10 feet activity   Assist  Walk 10 feet activity did not occur: Safety/medical concerns        Walk 50 feet activity   Assist Walk 50 feet with 2 turns activity did not occur: Safety/medical concerns         Walk 150 feet activity   Assist Walk 150 feet activity did not occur: Safety/medical concerns         Walk 10 feet on uneven surface  activity   Assist Walk 10 feet on uneven surfaces activity did not occur: Safety/medical concerns         Wheelchair     Assist Is the patient using a wheelchair?: Yes Type of Wheelchair: Manual    Wheelchair assist level: Supervision/Verbal cueing Max wheelchair distance: 53'    Wheelchair 50 feet with 2 turns activity    Assist        Assist Level: Supervision/Verbal cueing   Wheelchair 150 feet activity     Assist      Assist Level: Maximal Assistance - Patient 25 - 49%   Blood pressure 132/79, pulse 79, temperature 99.5 F (37.5 C), temperature source Oral, resp. rate 17, height 5' 2.99" (1.6 m), weight 79.6 kg, SpO2 98 %.  Medical Problem List and Plan: 1.  Functional deficits secondary to debility related to new onset a fib and  multiple medical             -patient may shower             -ELOS/Goals: 12-14 days, min assist at w/c level  D/c date set for 11/29  Con't PT and OT- spasticity and wound biggest limiter 2.  Antithrombotics: -DVT/anticoagulation:  Mechanical: Sequential compression devices, below knee Bilateral lower extremities             -antiplatelet therapy: N/a 3. Pain Management/spasticity: tylenol # 3 prn for pain.              -  pt followed by [facility], has had botox in past. Looks as if these are needed again to wrist and finger flexors  11/15- will have pt see Dr Letta Pate for Botox after d/c.   11/18- has oxycodone for wound care pain 4. Mood: LCSW to follow for evaluation and support.              -antipsychotic agents: N/A 5. Neuropsych: This patient is capable of making decisions on his own behalf. 6. Skin/Wound Care: Routine pressure relief Mesures.              --added beneprotein.  7. Fluids/Electrolytes/Nutrition: strict I/O. Check lytes in am.              --Monitor renal status with serial checks.  8. A fib new onset: Monitor HR TID and for symptoms with increase in activity.             --continue Coreg, Cardizem and amiodarone             --Plans for repeat EGD in 8 weeks for clearance on addition of DOAC  11/16- stable- regular rate- con't regimen 9. Gout flare: Continue Colchicine daily for now. Pt says it's improving.  11/16- Sx's resolved as of 11/15- off Prednisone 10. Acute on chronic AKI?: Due to NSAIDS for gout flare. Has resolved with SCr -1.3? Baseline .             -- 1.4 11/12--near baseline---continue to follow  11/15- Cr up slightly to 1.48- but baseline is 1.3 - will have pt push fluids and recheck Thursday.   11/16- encouraged pt to push fluids so kidney function is better/won't have to do IVFs- will see labs in AM  11/17- will give IVFs 75cc/hour x 24 hours due to renal issues  11/18- Cr down to 1.29 from 1.48- will stop IVFs and recheck Monday.  11. T2DM: Hgb  A1c-6.7. Monitor BS ac/hs and use SSI for elevated BS.  CBG (last 3)  Recent Labs    11/29/20 1634 11/29/20 2055 11/30/20 0507  GLUCAP 118* 119* 133*                 --Added CM restrictions. Continue to hold metformin 11/18- BG's doing great- con't regimen 12. Chronic diastolic CHF: Monitor I/O. Check daily weights.  --Will monitor for signs of overload. On Lipitor.              --Ace on hold due to recent AKI.   11/16- Weight down 2 kg in last 1 week- con't to monitor 13. Multiple gastric/colonic ulcers: On PPI BID.             --monitor for signs of bleeding.              --Anemia stable. -11/12- HB 11.1- will con't to monitor  14.  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  Loose stools likely colchicine, gout flare responding to prednisone , will d/c colchicine   11/15- doing better-con't to monitor 16. Pressure ulcers on backside  11/15- has unstageable full thickness on coccyx - will con't local wound care- if doesn't improve, will call WOC- seen by our WTA.   11/16- hydrotherapy ordered- will see when started- see if helps break up eschar- pain issues- con't prn meds  11/17- got Hydrotherapy yesterday- had increased buttock pain- might need to increase pain meds with hydrotherapy-   11/18- added oxy for pain from wound  LOS: 7 days A FACE TO FACE EVALUATION WAS PERFORMED  Theodore Rahrig 11/30/2020, 9:58 AM

## 2020-11-30 NOTE — Progress Notes (Signed)
Pt c/o increased pain during hydrotherapy today. Other in room and upset that pt in pain. Informed that pain medication was provided this am after shift change and just prior to hydrotherapy. Informed that the therapy does have some pain involved but we do premedicate prior to procedure.

## 2020-11-30 NOTE — Progress Notes (Signed)
Patient ID: [name], male   DOB: May 11, 1965, 55 y.o.   MRN: [id number]  SW followed up with pt wife [name] to provide updates on HHA being [facility], and roho cushion ordered. Reports that she has heard from [employer] about item, but unsure on delivery. SW will look into this and follow-up. SW also discussed staying until 2pm to ensure nursing has time to meet with her as well. She will be here for family edu 9am-2pm.   Loralee Pacas, MSW, [place] Office: [phone] Cell: [phone] Fax: [phone]

## 2020-11-30 NOTE — Progress Notes (Signed)
Occupational Therapy Session Note  Patient Details  Name: [name] MRN: [id number] Date of Birth: [birth year]  Today's Date: 11/30/2020 OT Individual Time: 0930-1000 OT Individual Time Calculation (min): 30 min  and Today's Date: 11/30/2020 OT Missed Time: 91 Minutes Missed Time Reason: Pain   Short Term Goals: Week 2:  OT Short Term Goal 1 (Week 2): Patient will complete 2/3 parts of toileting task with Min A and lateral leans. OT Short Term Goal 2 (Week 2): Patient will complete squat-pivot transfers with Min A. OT Short Term Goal 3 (Week 2): Pt will compete LB dressing with mod A sit<>stand/lateral leans  Skilled Therapeutic Interventions/Progress Updates:    Pt resting in w/c upon arrival, leaning to Lt to unweight buttocks. Pt engaged in sit<>stand X 5 with mod A/max A to increase independence with squat pivot transfers. Pt c/o increased pain in buttocks each time when returning to w/c. Pt unable to continue 2/2 pain and returned to bed. Pt immediately assumed sidelying position and reported relief. Pt remained in bed with all bed rails upright and all needs within reach. Pt missed 45 mins skilled OT serivces.   Therapy Documentation Precautions:  Precautions Precautions: Fall Precaution Comments: H/o CVA with residual L hemiparesis; dysarthric bowel incontinence Required Braces or Orthoses:  (L resting hand spint at baseline (variable schedule)) Restrictions Weight Bearing Restrictions: No General: General OT Amount of Missed Time: 45 Minutes   Pain: Pt c/o 7/10 buttocks/coccyx pain; repositioned   Therapy/Group: Individual Therapy  [name] 11/30/2020, 10:25 AM

## 2020-11-30 NOTE — Progress Notes (Signed)
Physical Therapy Wound Treatment Patient Details  Name: [name] MRN: [id number] Date of Birth: 1965-07-09  Today's Date: 11/30/2020 PT Individual Time: 5638-7564 PT Individual Time Calculation (min): 26 min   Subjective  Patient and Family Stated Goals: pt didn't state Date of Onset:  (unknow) Prior Treatments:  (unknown)  Pain Score: 6/10   premedication   Wound Assessment  Pressure Injury 11/23/20 Coccyx Right;Lower;Medial Unstageable - Full thickness tissue loss in which the base of the injury is covered by slough (yellow, tan, gray, green or brown) and/or eschar (tan, brown or black) in the wound bed. Yellow area of slou (Active)  Dressing Type Barrier Film (skin prep);Foam - Lift dressing to assess site every shift;Gauze (Comment);Moist to dry;Normal saline moist dressing 11/30/20 1134  Dressing Changed 11/30/20 1134  Dressing Change Frequency Daily 11/30/20 1134  State of Healing Eschar 11/30/20 1134  Site / Wound Assessment Clean;Pink;Yellow 11/30/20 1134  % Wound base Red or Granulating 10% 11/30/20 1134  % Wound base Yellow/Fibrinous Exudate 90% 11/30/20 1134  % Wound base Black/Eschar 0% 11/30/20 1134  % Wound base Other/Granulation Tissue (Comment) 0% 11/30/20 1134  Peri-wound Assessment Intact 11/30/20 1134  Wound Length (cm) 5 cm 11/28/20 1556  Wound Width (cm) 3.5 cm 11/28/20 1556  Wound Depth (cm) 3 cm 11/28/20 1556  Wound Surface Area (cm^2) 17.5 cm^2 11/28/20 1556  Wound Volume (cm^3) 52.5 cm^3 11/28/20 1556  Margins Unattached edges (unapproximated) 11/30/20 1134  Drainage Amount Minimal 11/30/20 1134  Drainage Description Serous;Sanguineous 11/30/20 1134  Treatment Cleansed;Debridement (Selective);Hydrotherapy (Pulse lavage);Packing (Saline gauze) 11/30/20 1134      Hydrotherapy Pulsed lavage therapy - wound location: medial coccyx to R buttock Pulsed Lavage with Suction (psi): 12 psi Pulsed Lavage with Suction - Normal Saline Used: 1000  mL Pulsed Lavage Tip: Tip with splash shield Selective Debridement Selective Debridement - Location: coccyx to r buttock Selective Debridement - Tools Used: Forceps;Scalpel Selective Debridement - Tissue Removed: yellow eschar   Wound Assessment and Plan  Wound Therapy - Assess/Plan/Recommendations Wound Therapy - Clinical Statement: pt will benefit for Pulsed Lavage to soften eschar for selective debridement and decrease the bioburden in the wound. Wound Therapy - Functional Problem List: functional immobility Factors Delaying/Impairing Wound Healing: Altered sensation;Infection - systemic/local;Immobility Hydrotherapy Plan: Debridement;Dressing change;Patient/family education;Pulsatile lavage with suction Wound Therapy - Frequency: 6X / week Wound Therapy - Current Recommendations: PT Wound Therapy - Follow Up Recommendations: dressing changes by RN  Wound Therapy Goals- Improve the function of patient's integumentary system by progressing the wound(s) through the phases of wound healing (inflammation - proliferation - remodeling) by: Decrease Necrotic Tissue to: 20 % Decrease Necrotic Tissue - Progress: Progressing toward goal Increase Granulation Tissue to: 80% Increase Granulation Tissue - Progress: Progressing toward goal Goals/treatment plan/discharge plan were made with and agreed upon by patient/family: Yes Time For Goal Achievement: 7 days Wound Therapy - Potential for Goals: Good  Goals will be updated until maximal potential achieved or discharge criteria met.  Discharge criteria: when goals achieved, discharge from hospital, MD decision/surgical intervention, no progress towards goals, refusal/missing three consecutive treatments without notification or medical reason.  GP     [name] 11/30/2020, 11:38 AM 11/30/2020  Mark Carne., PT Acute Rehabilitation Services [phone]  (pager) [phone]  (office)

## 2020-11-30 NOTE — Progress Notes (Signed)
Physical Therapy Session Note  Patient Details  Name: [name] MRN: [id number] Date of Birth: April 24, 1965  Today's Date: 11/30/2020 PT Individual Time: 0805-0900 and 1505-1540  PT Individual Time Calculation (min): 55 min and 35 min  Short Term Goals: Week 1:  PT Short Term Goal 1 (Week 1): Pt will transfer sidelying to sitting w/ min A from flat bed. PT Short Term Goal 2 (Week 1): Pt will transfer sit to stand w/ mod A. PT Short Term Goal 3 (Week 1): Pt will transfer bed <> w/c w/o lifting equipment. PT Short Term Goal 4 (Week 1): Pt will negotiate w/c w/ CGA 75'  Skilled Therapeutic Interventions/Progress Updates: Tx1: Pt presented in bed agreeable to therapy. Pt states pain 4/10 at buttock. Pt performed supine to sit at EOB with minA and use of bed features. Pt noted to have front shirt soiled with urine. PTA asked nsg to disconnect IV which was performed during session. PTA threaded pants total A for time management and pt performed Sit to stand from EOB with use of Stedy and CGA. PTA pulled pants over hips total A and transferred to w/c. PTA set up pt tray for oral hygiene and pt performed mod I. While PTA obtained scrub shirt pt doffed soiled shirt mod I and PTA assisted donning new one for time management. Pt then transported to 4th floor main gym and pt participated in Sit to stand in parallel bars with minA and PTA blocking L knee. Pt was able to perform x 3 consistently with minA but required mod multimodal cues for improving erect posture (raising chest and shoulders). Pt then performed x 2 additional Sit to stand with x 2 steps performed each. On first bout pt noted to increase forward flexion with each step but improved significantly on second bout. Once completed pt transported back to room and agreeable to remain in w/c as next therapy session in 30 min. Pt left in w/c with half lap tray in place, belt alarm on, call bell within reach and needs met.   Tx2: Pt presented in bed  with wife [name] present. Pt c/o pain 4/10 but premedicated. PTA had extensive discussion with wife regarding bedside home set up. Pt normally gets out of L side of bed and transitions bed/BSC/w/c more particularly since fall. Pt had been using HW previously and performed pivot with HW and is now only performing squat pivots. Pt agreeable to attempt to perform stand pivot with HW due to good power up and standing in am session. Pt performed supine to sit with CGA and use of a bed rail. PTA set up [name] for stand pivot but pt unable to reach for [name] due to increased anxiety. Pt then performed squat pivot to R with modA as pt noted to have poor foot placement. Pt then participated in block practice Sit to stand w/c to [facility]. Pt was able to perform x 5 with CGA fading to minA with fatigue. Pt intermittently having difficulty transitioning from w/c to [name]. Per pt and wife more of anxiety vs motor planning. Pt performed squat pivot transfer to bed with minA and performed sit to supine with minA for BLE management. Pt then rolled to sidelying for pain management. Pt left in bed at end of session with call bell within reach and needs met.      Therapy Documentation Precautions:  Precautions Precautions: Fall Precaution Comments: H/o CVA with residual L hemiparesis; dysarthric bowel incontinence Required Braces or Orthoses:  (L resting  hand spint at baseline (variable schedule)) Restrictions Weight Bearing Restrictions: No General:   Vital Signs: Therapy Vitals Temp: 98 F (36.7 C) Pulse Rate: 77 Resp: 16 BP: 105/61 Patient Position (if appropriate): Lying Oxygen Therapy SpO2: 96 % O2 Device: Room Air Pain: Pain Assessment Pain Scale: 0-10 Pain Score: 2  Pain Type: Acute pain Pain Location: Sacrum Pain Orientation: Right;Left;Mid;Lower Pain Descriptors / Indicators: Aching;Burning Pain Frequency: Constant Pain Onset: On-going Pain Intervention(s): Medication (See eMAR)   Therapy/Group:  Individual Therapy  Janiyla Long Precious Gilchrest, PTA  11/30/2020, 4:00 PM

## 2020-12-01 DIAGNOSIS — L89890 Pressure ulcer of other site, unstageable: Secondary | ICD-10-CM

## 2020-12-01 DIAGNOSIS — I4891 Unspecified atrial fibrillation: Secondary | ICD-10-CM

## 2020-12-01 DIAGNOSIS — K253 Acute gastric ulcer without hemorrhage or perforation: Secondary | ICD-10-CM

## 2020-12-01 DIAGNOSIS — E1165 Type 2 diabetes mellitus with hyperglycemia: Secondary | ICD-10-CM

## 2020-12-01 LAB — GLUCOSE, CAPILLARY
Glucose-Capillary: 130 mg/dL — ABNORMAL HIGH (ref 70–99)
Glucose-Capillary: 136 mg/dL — ABNORMAL HIGH (ref 70–99)
Glucose-Capillary: 132 mg/dL — ABNORMAL HIGH (ref 70–99)
Glucose-Capillary: 137 mg/dL — ABNORMAL HIGH (ref 70–99)

## 2020-12-01 NOTE — Progress Notes (Signed)
Physical Therapy Wound Treatment Patient Details  Name: Chaun D Pallas MRN: [id number] Date of Birth: 01/17/1965  Today's Date: 12/01/2020 PT Individual Time: 1047-1105 PT Individual Time Calculation (min): 18 min   Subjective  Subjective: agreeable to hydrotherapy Patient and Family Stated Goals: pt didn't state Date of Onset:  (unknow) Prior Treatments:  (unknown)  Pain Score:  6/10 (premedicated)  Wound Assessment  Pressure Injury 11/23/20 Coccyx Right;Lower;Medial Unstageable - Full thickness tissue loss in which the base of the injury is covered by slough (yellow, tan, gray, green or brown) and/or eschar (tan, brown or black) in the wound bed. Yellow area of slou (Active)  Dressing Type Barrier Film (skin prep);Gauze (Comment);Moist to dry;Foam - Lift dressing to assess site every shift 12/01/20 1356  Dressing Changed 12/01/20 1356  Dressing Change Frequency Twice a day 12/01/20 1356  State of Healing Early/partial granulation 12/01/20 1356  Site / Wound Assessment Pink;Yellow 12/01/20 1356  % Wound base Red or Granulating 50% 12/01/20 1356  % Wound base Yellow/Fibrinous Exudate 50% 12/01/20 1356  % Wound base Black/Eschar 0% 12/01/20 1356  % Wound base Other/Granulation Tissue (Comment) 0% 12/01/20 1356  Peri-wound Assessment Erythema (blanchable) 12/01/20 1356  Wound Length (cm) 5 cm 11/28/20 1556  Wound Width (cm) 3.5 cm 11/28/20 1556  Wound Depth (cm) 3 cm 11/28/20 1556  Wound Surface Area (cm^2) 17.5 cm^2 11/28/20 1556  Wound Volume (cm^3) 52.5 cm^3 11/28/20 1556  Margins Unattached edges (unapproximated) 12/01/20 1356  Drainage Amount Minimal 12/01/20 1356  Drainage Description Serous 12/01/20 1356  Treatment Debridement (Selective);Hydrotherapy (Pulse lavage);Packing (Saline gauze) 12/01/20 1356      Hydrotherapy Pulsed lavage therapy - wound location: medial coccyx to R buttock Pulsed Lavage with Suction (psi): 8 psi Pulsed Lavage with Suction - Normal Saline  Used: 1000 mL Pulsed Lavage Tip: Tip with splash shield Selective Debridement Selective Debridement - Location: coccyx to r buttock Selective Debridement - Tools Used: Forceps;Scalpel Selective Debridement - Tissue Removed: yellow eschar   Wound Assessment and Plan  Wound Therapy - Assess/Plan/Recommendations Wound Therapy - Clinical Statement: Wound bed is progressing nicely with hydrothearpy; increased granulation tissue noted this session. Pt with discomfort with sharp debridement despite premedication. pt will benefit from continued Pulsed Lavage to soften eschar for selective debridement and decrease the bioburden in the wound. Wound Therapy - Functional Problem List: functional immobility Factors Delaying/Impairing Wound Healing: Altered sensation;Infection - systemic/local;Immobility Hydrotherapy Plan: Debridement;Dressing change;Patient/family education;Pulsatile lavage with suction Wound Therapy - Frequency: 6X / week Wound Therapy - Current Recommendations: PT Wound Therapy - Follow Up Recommendations: dressing changes by RN  Wound Therapy Goals- Improve the function of patient's integumentary system by progressing the wound(s) through the phases of wound healing (inflammation - proliferation - remodeling) by: Decrease Necrotic Tissue to: 20 % Decrease Necrotic Tissue - Progress: Progressing toward goal Increase Granulation Tissue to: 80% Increase Granulation Tissue - Progress: Progressing toward goal Goals/treatment plan/discharge plan were made with and agreed upon by patient/family: Yes Time For Goal Achievement: 7 days Wound Therapy - Potential for Goals: Good  Goals will be updated until maximal potential achieved or discharge criteria met.  Discharge criteria: when goals achieved, discharge from hospital, MD decision/surgical intervention, no progress towards goals, refusal/missing three consecutive treatments without notification or medical reason.  GP    Caroline  Brown, PT, DPT Acute Rehabilitation Services Pager [phone] Office [phone]   Carloine T Brown 12/01/2020, 1:59 PM  

## 2020-12-01 NOTE — Progress Notes (Signed)
Physical Therapy Session Note  Patient Details  Name: [name] MRN: [id number] Date of Birth: [birth year]  Today's Date: 12/01/2020 PT Individual Time: 0814-4818 PT Individual Time Calculation (min): 40 min  Missed time: 20' Short Term Goals: Week 1:  PT Short Term Goal 1 (Week 1): Pt will transfer sidelying to sitting w/ min A from flat bed. PT Short Term Goal 2 (Week 1): Pt will transfer sit to stand w/ mod A. PT Short Term Goal 3 (Week 1): Pt will transfer bed <> w/c w/o lifting equipment. PT Short Term Goal 4 (Week 1): Pt will negotiate w/c w/ CGA 75'  Skilled Therapeutic Interventions/Progress Updates: Pt presents sidelying in bed and reluctantly agreeable to therapy.  Pt states " they are just cleaning me up", but pt is clean and has new brief on.  Pt requires assist to thread pants over feet and then pulls up to buttocks.  Pt then rolls side to side for PT to complete dressing over hips.  Pt required only min to CGA for final 10% of sidelying to sitting transfer on left side.  Pt scoots self to EOB.  Pt  requires min to mod A for squat pivot to w/c  and scoot back into w/c.  Pt wheeled self x 40' before stating that brief is uncomfortable.  Pt returned to room and stood w/ min A at bed rail for PT to rotate brief to more comfortable position.  Pt wishes to return to bed at this time.  Pt performed squat pivot w/c > bed to left w/ mod A.  Pt then performed upper body reverse log roll to left sidelying and mod A for LEs from PT.  Pt pulled to [facility] w/ RUE.  All needs in place.  Pt remained left sidelying w/ pillow between knees and behind back for comfort.     Therapy Documentation Precautions:  Precautions Precautions: Fall Precaution Comments: H/o CVA with residual L hemiparesis; dysarthric bowel incontinence Required Braces or Orthoses:  (L resting hand spint at baseline (variable schedule)) Restrictions Weight Bearing Restrictions: No General:   Vital Signs:   Pain: no  c/o pain.          Therapy/Group: Individual Therapy  [name] 12/01/2020, 2:47 PM

## 2020-12-01 NOTE — Progress Notes (Signed)
PROGRESS NOTE   Subjective/Complaints: Patient seen laying in bed this morning.  He he states he slept well overnight.  He denies complaints.  ROS: Denies CP, SOB, N/V/D  Objective:   No results found. No results for input(s): WBC, HGB, HCT, PLT in the last 72 hours.  Recent Labs    11/30/20 0504  NA 136  K 4.9  CL 109  CO2 19*  GLUCOSE 132*  BUN 22*  CREATININE 1.29*  CALCIUM 8.5*     Intake/Output Summary (Last 24 hours) at 12/01/2020 0909 Last data filed at 12/01/2020 0900 Gross per 24 hour  Intake 594 ml  Output 325 ml  Net 269 ml      Pressure Injury 11/23/20 Coccyx Right;Lower;Medial Unstageable - Full thickness tissue loss in which the base of the injury is covered by slough (yellow, tan, gray, green or brown) and/or eschar (tan, brown or black) in the wound bed. Yellow area of slou (Active)  11/23/20 1836  Location: Coccyx  Location Orientation: Right;Lower;Medial  Staging: Unstageable - Full thickness tissue loss in which the base of the injury is covered by slough (yellow, tan, gray, green or brown) and/or eschar (tan, brown or black) in the wound bed.  Wound Description (Comments): Yellow area of slough  Present on Admission: Yes     Pressure Injury 11/23/20 Coccyx Left;Lower;Medial Unstageable - Full thickness tissue loss in which the base of the injury is covered by slough (yellow, tan, gray, green or brown) and/or eschar (tan, brown or black) in the wound bed. Thick yellow and bro (Active)  11/23/20 1836  Location: Coccyx  Location Orientation: Left;Lower;Medial  Staging: Unstageable - Full thickness tissue loss in which the base of the injury is covered by slough (yellow, tan, gray, green or brown) and/or eschar (tan, brown or black) in the wound bed.  Wound Description (Comments): Thick yellow and brown tissue. Unable to view healthy tissue.  Present on Admission: Yes    Physical Exam: Vital  Signs Blood pressure 107/61, pulse 70, temperature 98.7 F (37.1 C), resp. rate 16, height 5' 2.99" (1.6 m), weight 109 kg, SpO2 98 %. Constitutional: No distress . Vital signs reviewed. HENT: Normocephalic.  Atraumatic. Eyes: EOMI. No discharge. Cardiovascular: No JVD.  RRR. Respiratory: Normal effort.  No stridor.  Bilateral clear to auscultation. GI: Non-distended.  BS +. Skin: Warm and dry.  Wounds with dressing CDI Psych: Normal mood.  Normal behavior. Musc: No edema in extremities.  No tenderness in extremities. Neuro: Alert Dysarthria Motor: LUE 1+ to 2/5 prox to distal. MAS 3wrist/finger flexors and biceps.  LLE 3-/5 prox to 3/5 distally without  tone, stable   Assessment/Plan: 1. Functional deficits which require 3+ hours per day of interdisciplinary therapy in a comprehensive inpatient rehab setting. Physiatrist is providing close team supervision and 24 hour management of active medical problems listed below. Physiatrist and rehab team continue to assess barriers to discharge/monitor patient progress toward functional and medical goals  Care Tool:  Bathing    Body parts bathed by patient: Left arm, Chest, Abdomen, Front perineal area, Left upper leg, Right upper leg, Face   Body parts bathed by helper: Buttocks, Right lower leg,  Left lower leg     Bathing assist Assist Level: Moderate Assistance - Patient 50 - 74%     Upper Body Dressing/Undressing Upper body dressing   What is the patient wearing?: Pull over shirt    Upper body assist Assist Level: Supervision/Verbal cueing    Lower Body Dressing/Undressing Lower body dressing      What is the patient wearing?: Incontinence brief, Pants     Lower body assist Assist for lower body dressing: Moderate Assistance - Patient 50 - 74%     Toileting Toileting Toileting Activity did not occur (Clothing management and hygiene only): N/A (no void or bm)  Toileting assist Assist for toileting: Moderate Assistance  - Patient 50 - 74%     Transfers Chair/bed transfer  Transfers assist     Chair/bed transfer assist level: Moderate Assistance - Patient 50 - 74%     Locomotion Ambulation   Ambulation assist   Ambulation activity did not occur: Safety/medical concerns          Walk 10 feet activity   Assist  Walk 10 feet activity did not occur: Safety/medical concerns        Walk 50 feet activity   Assist Walk 50 feet with 2 turns activity did not occur: Safety/medical concerns         Walk 150 feet activity   Assist Walk 150 feet activity did not occur: Safety/medical concerns         Walk 10 feet on uneven surface  activity   Assist Walk 10 feet on uneven surfaces activity did not occur: Safety/medical concerns         Wheelchair     Assist Is the patient using a wheelchair?: Yes Type of Wheelchair: Manual    Wheelchair assist level: Supervision/Verbal cueing Max wheelchair distance: 45'    Wheelchair 50 feet with 2 turns activity    Assist        Assist Level: Supervision/Verbal cueing   Wheelchair 150 feet activity     Assist      Assist Level: Maximal Assistance - Patient 25 - 49%   Blood pressure 107/61, pulse 70, temperature 98.7 F (37.1 C), resp. rate 16, height 5' 2.99" (1.6 m), weight 109 kg, SpO2 98 %.  Medical Problem List and Plan: 1.  Functional deficits secondary to debility related to new onset a fib and multiple medical             Continue CIR 2.  Antithrombotics: -DVT/anticoagulation:  Mechanical: Sequential compression devices, below knee Bilateral lower extremities             -antiplatelet therapy: N/a 3. Pain Management/spasticity: tylenol # 3 prn for pain.              -pt followed by [facility], has had botox in past. Looks as if these are needed again to wrist and finger flexors  11/15- will have pt see Dr Letta Pate for Botox after d/c.   11/18- has oxycodone for wound care pain   Controlled on 11/19 4.  Mood: LCSW to follow for evaluation and support.              -antipsychotic agents: N/A 5. Neuropsych: This patient is capable of making decisions on his own behalf. 6. Skin/Wound Care: Routine pressure relief Mesures.              --added beneprotein.  7. Fluids/Electrolytes/Nutrition: strict I/Os             --  Monitor renal status with serial checks.  8. A fib new onset: Monitor HR TID and for symptoms with increase in activity.             --continue Coreg, Cardizem and amiodarone             --Plans for repeat EGD in 8 weeks for clearance on addition of DOAC  Rate controlled 11/19 9. Gout flare: Continue Colchicine daily for now. Pt says it's improving.  11/16- Sx's resolved as of 11/15- off Prednisone 10. Acute on chronic AKI?: Due to NSAIDS for gout flare. Has resolved with SCr -1.3? Baseline .             -- 1.4 11/12--near baseline---continue to follow  11/17- IVFs 75cc/hour x 24 hours due to renal issues  Creatinine 1.29 on 1/18, labs ordered for Monday 11. T2DM with hyperglycemia: Hgb A1c-6.7. Monitor BS ac/hs and use SSI for elevated BS.  CBG (last 3)  Recent Labs    11/30/20 1612 11/30/20 2106 12/01/20 0610  GLUCAP 139* 127* 130*     --Added CM restrictions. Continue to hold metformin Mildly elevated on 1/19 12. Chronic diastolic CHF: Monitor I/O. Check daily weights.  --Will monitor for signs of overload. On Lipitor.              --Ace on hold due to recent AKI.  13. Multiple gastric/colonic ulcers: On PPI BID.             --monitor for signs of bleeding.              Hemoglobin 10.1 on 11/14, labs ordered for Monday  Will con't to monitor  14.  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  Loose stools likely colchicine, gout flare responding to prednisone , will d/ced colchicine   11/15- doing better-con't to monitor 16. Pressure ulcers on backside  11/15- has unstageable full thickness on coccyx - will con't  local wound care- if doesn't improve, will call [facility]OC- seen by our WTA.   11/16- hydrotherapy ordered  11/18- added oxy for pain from wound  LOS: 8 days A FACE TO FACE EVALUATION WAS PERFORMED  Brylen Wagar Lorie Phenix 12/01/2020, 9:09 AM

## 2020-12-02 LAB — GLUCOSE, CAPILLARY
Glucose-Capillary: 156 mg/dL — ABNORMAL HIGH (ref 70–99)
Glucose-Capillary: 130 mg/dL — ABNORMAL HIGH (ref 70–99)
Glucose-Capillary: 116 mg/dL — ABNORMAL HIGH (ref 70–99)
Glucose-Capillary: 125 mg/dL — ABNORMAL HIGH (ref 70–99)

## 2020-12-02 NOTE — Plan of Care (Signed)
  Problem: RH Balance Goal: LTG Patient will maintain dynamic sitting balance (PT) Description: LTG:  Patient will maintain dynamic sitting balance with assistance during mobility activities (PT) Flowsheets (Taken 12/02/2020 1242) LTG: Pt will maintain dynamic sitting balance during mobility activities with:: (downgrade due to slow progress) Supervision/Verbal cueing Note: downgrade due to slow progress   Problem: RH Bed Mobility Goal: LTG Patient will perform bed mobility with assist (PT) Description: LTG: Patient will perform bed mobility with assistance, with/without cues (PT). Flowsheets (Taken 12/02/2020 1242) LTG: Pt will perform bed mobility with assistance level of: (downgrade due to slow progress) Minimal Assistance - Patient > 75% Note: downgrade due to slow progress   Problem: RH Bed to Chair Transfers Goal: LTG Patient will perform bed/chair transfers w/assist (PT) Description: LTG: Patient will perform bed to chair transfers with assistance (PT). Flowsheets (Taken 12/02/2020 1242) LTG: Pt will perform Bed to Chair Transfers with assistance level: (downgrade due to slow progress) Minimal Assistance - Patient > 75% Note: downgrade due to slow progress   Problem: RH Car Transfers Goal: LTG Patient will perform car transfers with assist (PT) Description: LTG: Patient will perform car transfers with assistance (PT). Flowsheets (Taken 12/02/2020 1242) LTG: Pt will perform car transfers with assist:: (downgrade due to slow progress) Minimal Assistance - Patient > 75% Note: downgrade due to slow progress   Problem: RH Furniture Transfers Goal: LTG Patient will perform furniture transfers w/assist (OT/PT) Description: LTG: Patient will perform furniture transfers  with assistance (OT/PT). Flowsheets (Taken 12/02/2020 1242) LTG: Pt will perform furniture transfers with assist:: (downgrade due to slow progress) Minimal Assistance - Patient > 75% Note: downgrade due to slow  progress

## 2020-12-02 NOTE — Progress Notes (Signed)
Occupational Therapy Session Note  Patient Details  Name: [name] MRN: [id number] Date of Birth: May 29, 1965  Today's Date: 12/03/2020 OT Individual Time: 0900-1000 and 1435-1530 OT Individual Time Calculation (min): 60 min and 55 min    Skilled Therapeutic Interventions/Progress Updates:    Pt greeted in bed with c/o buttocks pain. Rest/repositioning provided throughout session for pain mgt, also notified PT to assist pt back to bed for hydrotherapy/pressure relief. Per wife [name], pt was Mod I doing his own squat pivots and bathing/dressing PTA, pt compensating with the Rt side for Lt sided deficits. Max A for supine<sit from flat bed without bedrail per setup at home and Mod A for squat pivot<w/c going towards his unaffected Rt side. Pt stated that he used to don pants w/c level performing leans/small squat-stands. Pt required Max A for threading LEs into pants, +2 for pulling pants up with 1 assist for facilitating stand and 2nd helper assisting with clothing. Mod A for oral care while integrating the Lt side. Discussed with [name] importance of joint protection, positioning, and using the Lt hand as a gross stabilizer. He exhibits proximal activation, discussed ROM/strengthening opportunities in the context of self care. Education provided on hemi dressing techniques with pt needing Mod-Max A to don overhead shirt. Vcs also for orientation. [name] reports that she will be able to assist pt as needed at home, is going to take FMLA. We discussed importance of daily stretching to Lt digits/wrist/hand, pt with a lot of hypertonicity in MCP joints. Provided folded wash cloths to address and also placed pts half lap tray. He was left via direct PT handoff at close of session.   2nd Session 1:1 tx (55 min) Pt greeted in bed with c/o buttocks pain. Per pt, already premedicated. He requested for bedlevel tx due to fatigue. When blankets were pulled back, pt found to be very incontinent of  urine. Total A of 2 for hygiene/brief change. His wound dressings remained dry, RN in to assess. Afterwards pt was assisted in partial sidelying position on Rt side and worked on [place] with scapula supported in gravity minimized and maximized planes. Pt required mod facilitation for shoulder ROM with gravity maximized, also assist to fully extend his Lt elbow due to hypertonicity. Guided him through B UE exercises using unweighted bar with Lt hand ACE wrapped to it. Mod facilitation for shoulder flexion/extension and forward/backward rowing. Pt agreeable to remain in sidelying on Rt side to preserve Lt shoulder integrity. He remained in bed at close of session, all needs within reach.   Therapy Documentation Precautions:  Precautions Precautions: Fall Precaution Comments: H/o CVA with residual L hemiparesis; dysarthric bowel incontinence Required Braces or Orthoses:  (L resting hand spint at baseline (variable schedule)) Restrictions Weight Bearing Restrictions: No  ADL: ADL Eating: Set up Where Assessed-Eating: Chair Grooming: Not assessed Upper Body Bathing: Minimal assistance Where Assessed-Upper Body Bathing: Chair Lower Body Bathing: Moderate assistance Where Assessed-Lower Body Bathing: Other (Comment) (Stedy) Upper Body Dressing: Minimal assistance Where Assessed-Upper Body Dressing: Chair Lower Body Dressing: Maximal assistance Where Assessed-Lower Body Dressing: Chair Toileting: Not assessed Toilet Transfer Method: Not assessed Tub/Shower Transfer: Not assessed  Therapy/Group: Individual Therapy  Emet Rafanan A Shanin Szymanowski 12/03/2020, 4:04 PM

## 2020-12-02 NOTE — Progress Notes (Signed)
Occupational Therapy Session Note  Patient Details  Name: [name] MRN: [id number] Date of Birth: [birth year]  Today's Date: 12/02/2020 OT Individual Time: 1115-1200 OT Individual Time Calculation (min): 45 min    Short Term Goals: Week 2:  OT Short Term Goal 1 (Week 2): Patient will complete 2/3 parts of toileting task with Min A and lateral leans. OT Short Term Goal 2 (Week 2): Patient will complete squat-pivot transfers with Min A. OT Short Term Goal 3 (Week 2): Pt will compete LB dressing with mod A sit<>stand/lateral leans   Skilled Therapeutic Interventions/Progress Updates:    Pt sitting in w/c hunched forward over half lap tray.  Pt nodding yes when asked if his bottom hurt and rated 6/10.  Nurse made aware.  Instructed pt in postural exercises to promote increased strength.  Pt needing external focus with targets placed and completed 2 x 10 of cervical retraction and scapular retractions.  OT located TIS w/c however unable to find matching leg rests therefore primary PT notified to facilitate proper w/c needs.  Squat pivot to EOB with max assist.  Sit to supine with max assist as well.  Pt positioned in sidelying to left to reduce pressure over buttocks.  Call bell in reach, needs in reach.    Therapy Documentation Precautions:  Precautions Precautions: Fall Precaution Comments: H/o CVA with residual L hemiparesis; dysarthric bowel incontinence Required Braces or Orthoses:  (L resting hand spint at baseline (variable schedule)) Restrictions Weight Bearing Restrictions: No    Therapy/Group: Individual Therapy  Ezekiel Slocumb 12/02/2020, 3:00 PM

## 2020-12-02 NOTE — Progress Notes (Addendum)
Physical Therapy Weekly Progress Note  Patient Details  Name: [name] MRN: [id number] Date of Birth: Jul 09, 1965  Beginning of progress report period: November 24, 2020 End of progress report period: December 02, 2020  Today's Date: 12/02/2020 PT Individual Time: 0800-0900 PT Individual Time Calculation (min): 60 min   Patient has met 3 of 4 short term goals.  Pt is making slow but steady progress towards therapy goals. He is currently at mod to max A for bed mobility, ranges from min A to max A for squat pivot transfers, can stand in // bars and with HW with min to mod A, and can perform w/c mobility up to 100 ft at Supervision level. Pt remains limited by L hemi-body weakness, decreased initiation of tasks, and overall passive approach to therapy. Pt also remains limited by buttock wound and pain which can limit his tolerance for participation at time spent out of bed.  Patient continues to demonstrate the following deficits muscle weakness and muscle joint tightness, decreased cardiorespiratoy endurance, impaired timing and sequencing, decreased initiation, decreased awareness, decreased problem solving, and decreased safety awareness, and decreased sitting balance, decreased standing balance, decreased postural control, hemiplegia, and decreased balance strategies and therefore will continue to benefit from skilled PT intervention to increase functional independence with mobility.  Patient not progressing toward long term goals.  See goal revision..  Plan of care revisions: downgraded goals to min A overall due slow progress.  PT Short Term Goals Week 1:  PT Short Term Goal 1 (Week 1): Pt will transfer sidelying to sitting w/ min A from flat bed. PT Short Term Goal 1 - Progress (Week 1): Progressing toward goal PT Short Term Goal 2 (Week 1): Pt will transfer sit to stand w/ mod A. PT Short Term Goal 2 - Progress (Week 1): Met PT Short Term Goal 3 (Week 1): Pt will transfer bed <>  w/c w/o lifting equipment. PT Short Term Goal 3 - Progress (Week 1): Met PT Short Term Goal 4 (Week 1): Pt will negotiate w/c w/ CGA 75' PT Short Term Goal 4 - Progress (Week 1): Met Week 2:  PT Short Term Goal 1 (Week 2): Pt will perform least restrictive transfer with min A consistently PT Short Term Goal 2 (Week 2): Pt will perform sit to stand with LRAD and min A consistently PT Short Term Goal 3 (Week 2): Pt will perform bed mobility with min A consistently  Skilled Therapeutic Interventions/Progress Updates:    Pt received in L sidelying in bed up against bedrail. Pt also has not eaten breakfast yet. Encouraged pt to get up to w/c to eat breakfast, he initially declines due to pain at site of wound in his buttocks. Education with patient about importance of time spent out of bed for recovery as well as position changes he can perform from seated position due to buttock soreness. Pt agreeable. Sidelying to supine with mod A needed for some LLE and LUE management. Pt is max A to don pants at bed level, able to assist some with RUE to pull up over hips. Supine to sitting EOB with max A needed for some LE management and trunk elevation. Once in seated position pt exhibits R lateral lean, unable to correct and maintain upright sitting position on air mattress. Squat pivot transfer to the L to w/c with max A. Pt exhibits decreased ability to assist with transfer this AM with increased time and cueing needed to initiate transfer. Once seated in w/c  pt remains with anterior and R lateral weight shift, reports his positioning is due to buttock pain. Pt is setup A for eating breakfast while seated in chair. Pt is then mod A to doff scrub top and don new shirt with cues for sequencing. Sit to stand x 5 reps to [facility] with min A increasing to mod A with onset of fatigue. Pt exhibits flexed posture of trunk, head, and knees in standing. Attempt to have pt take a step with LLE in standing, unable to perform adequate  weigth shift due to flexed posture. By the time pt reaches 5th repetition of standing he is unable to transfer RUE from w/c armrest to [facility] once in standing position due to fatigue and/or anxiety. Reviewed pressure relief techniques while seated in w/c. Pt left seated in w/c in room with needs in reach, quick release belt in place.  Therapy Documentation Precautions:  Precautions Precautions: Fall Precaution Comments: H/o CVA with residual L hemiparesis; dysarthric bowel incontinence Required Braces or Orthoses:  (L resting hand spint at baseline (variable schedule)) Restrictions Weight Bearing Restrictions: No    Therapy/Group: Individual Therapy   Excell Seltzer, PT, DPT, CSRS 12/02/2020, 12:17 PM

## 2020-12-02 NOTE — Progress Notes (Signed)
Physical Therapy Session Note  Patient Details  Name: [name] MRN: [id number] Date of Birth: [birth year]  Today's Date: 12/02/2020 PT Individual Time: 0272-5366 PT Individual Time Calculation (min): 24 min   Short Term Goals: Week 1:  PT Short Term Goal 1 (Week 1): Pt will transfer sidelying to sitting w/ min A from flat bed. PT Short Term Goal 2 (Week 1): Pt will transfer sit to stand w/ mod A. PT Short Term Goal 3 (Week 1): Pt will transfer bed <> w/c w/o lifting equipment. PT Short Term Goal 4 (Week 1): Pt will negotiate w/c w/ CGA 75'   Skilled Therapeutic Interventions/Progress Updates: Pt presented in bed in sidelying agreeable to therapy with encouragement. Pt states increased pain in buttock pain 6-7/10. Pt advised OT brought in TIS but unable to locate leg rests. PTA checked multiple storage closets in search for appropriate leg rests but unsuccessful, will notify supervisor in regards to issue. Upon PTA return to room pt attempted to perform supine to sit to L with bed flat. PTA was able to block BLE to allow pt to scoot towards EOB. Pt able to initiate push through RUE with PTA providing modA however as pt comes up to sitting pt crying out in pain. Pt returned to sidelying and ultimately repositioned with pillow between legs and supported under buttocks. Pt missed 50 min skilled PT due to pain.    Therapy Documentation Precautions:  Precautions Precautions: Fall Precaution Comments: H/o CVA with residual L hemiparesis; dysarthric bowel incontinence Required Braces or Orthoses:  (L resting hand spint at baseline (variable schedule)) Restrictions Weight Bearing Restrictions: No General: PT Amount of Missed Time (min): 51 Minutes PT Missed Treatment Reason: Pain Vital Signs: Therapy Vitals Temp: 98 F (36.7 C) Pulse Rate: 76 Resp: 18 BP: (!) 96/57 Patient Position (if appropriate): Lying Oxygen Therapy SpO2: 95 % O2 Device: Room Air   Therapy/Group:  Individual Therapy  Tiombe Tomeo Keesha Pellum, PTA  12/02/2020, 3:43 PM

## 2020-12-03 LAB — GLUCOSE, CAPILLARY
Glucose-Capillary: 134 mg/dL — ABNORMAL HIGH (ref 70–99)
Glucose-Capillary: 157 mg/dL — ABNORMAL HIGH (ref 70–99)
Glucose-Capillary: 163 mg/dL — ABNORMAL HIGH (ref 70–99)
Glucose-Capillary: 116 mg/dL — ABNORMAL HIGH (ref 70–99)

## 2020-12-03 LAB — BASIC METABOLIC PANEL
Anion gap: 8 (ref 5–15)
BUN: 18 mg/dL (ref 6–20)
CO2: 22 mmol/L (ref 22–32)
Calcium: 8.6 mg/dL — ABNORMAL LOW (ref 8.9–10.3)
Chloride: 105 mmol/L (ref 98–111)
Creatinine, Ser: 1.57 mg/dL — ABNORMAL HIGH (ref 0.61–1.24)
GFR, Estimated: 52 mL/min — ABNORMAL LOW (ref >60)
Glucose, Bld: 134 mg/dL — ABNORMAL HIGH (ref 70–99)
Potassium: 4.4 mmol/L (ref 3.5–5.1)
Sodium: 135 mmol/L (ref 135–145)

## 2020-12-03 LAB — CBC
HCT: 30.2 % — ABNORMAL LOW (ref 39.0–52.0)
Hemoglobin: 9.9 g/dL — ABNORMAL LOW (ref 13.0–17.0)
MCH: 25.7 pg — ABNORMAL LOW (ref 26.0–34.0)
MCHC: 32.8 g/dL (ref 30.0–36.0)
MCV: 78.4 fL — ABNORMAL LOW (ref 80.0–100.0)
Platelets: 295 10*3/uL (ref 150–400)
RBC: 3.85 MIL/uL — ABNORMAL LOW (ref 4.22–5.81)
RDW: 16.3 % — ABNORMAL HIGH (ref 11.5–15.5)
WBC: 8.6 10*3/uL (ref 4.0–10.5)
nRBC: 0 % (ref 0.0–0.2)

## 2020-12-03 NOTE — Progress Notes (Signed)
PROGRESS NOTE   Subjective/Complaints:  Pt reports LBM yesterday- denies pain.   Says is drinking well, but not 8 cups/day- advised pt to drink 8 cups of water/day,   ROS:  Pt denies SOB, abd pain, CP, N/V/C/D, and vision changes   Objective:   No results found. Recent Labs    12/03/20 0457  WBC 8.6  HGB 9.9*  HCT 30.2*  PLT 295    Recent Labs    12/03/20 0457  NA 135  K 4.4  CL 105  CO2 22  GLUCOSE 134*  BUN 18  CREATININE 1.57*  CALCIUM 8.6*    Intake/Output Summary (Last 24 hours) at 12/03/2020 1808 Last data filed at 12/03/2020 1300 Gross per 24 hour  Intake 554 ml  Output 225 ml  Net 329 ml     Pressure Injury 11/23/20 Coccyx Right;Lower;Medial Unstageable - Full thickness tissue loss in which the base of the injury is covered by slough (yellow, tan, gray, green or brown) and/or eschar (tan, brown or black) in the wound bed. Yellow area of slou (Active)  11/23/20 1836  Location: Coccyx  Location Orientation: Right;Lower;Medial  Staging: Unstageable - Full thickness tissue loss in which the base of the injury is covered by slough (yellow, tan, gray, green or brown) and/or eschar (tan, brown or black) in the wound bed.  Wound Description (Comments): Yellow area of slough  Present on Admission: Yes     Pressure Injury 11/23/20 Coccyx Left;Lower;Medial Unstageable - Full thickness tissue loss in which the base of the injury is covered by slough (yellow, tan, gray, green or brown) and/or eschar (tan, brown or black) in the wound bed. Thick yellow and bro (Active)  11/23/20 1836  Location: Coccyx  Location Orientation: Left;Lower;Medial  Staging: Unstageable - Full thickness tissue loss in which the base of the injury is covered by slough (yellow, tan, gray, green or brown) and/or eschar (tan, brown or black) in the wound bed.  Wound Description (Comments): Thick yellow and brown tissue. Unable to view  healthy tissue.  Present on Admission: Yes    Physical Exam: Vital Signs Blood pressure 109/79, pulse 80, temperature 97.9 F (36.6 C), resp. rate 20, height 5' 2.99" (1.6 m), weight 111 kg, SpO2 92 %.    General: awake, alert, appropriate, laying on L side in bed- NAD HENT: conjugate gaze; oropharynx moist CV: regular rate; no JVD Pulmonary: CTA B/L; no W/R/R- good air movement GI: soft, NT, ND, (+)BS Psychiatric: appropriate Neurological: dysarthric; L hemiparesis with spasticity no change Skin: Warm and dry.  Wounds with dressing CDI Musc: No edema in extremities.  No tenderness in extremities. Dysarthria Motor: LUE 1+ to 2/5 prox to distal. MAS 3wrist/finger flexors and biceps.  LLE 3-/5 prox to 3/5 distally without  tone, stable   Assessment/Plan: 1. Functional deficits which require 3+ hours per day of interdisciplinary therapy in a comprehensive inpatient rehab setting. Physiatrist is providing close team supervision and 24 hour management of active medical problems listed below. Physiatrist and rehab team continue to assess barriers to discharge/monitor patient progress toward functional and medical goals  Care Tool:  Bathing    Body parts bathed by patient: Left  arm, Chest, Abdomen, Front perineal area, Left upper leg, Right upper leg, Face   Body parts bathed by helper: Buttocks, Right lower leg, Left lower leg     Bathing assist Assist Level: Moderate Assistance - Patient 50 - 74%     Upper Body Dressing/Undressing Upper body dressing   What is the patient wearing?: Pull over shirt    Upper body assist Assist Level: Supervision/Verbal cueing    Lower Body Dressing/Undressing Lower body dressing      What is the patient wearing?: Incontinence brief, Pants     Lower body assist Assist for lower body dressing: Moderate Assistance - Patient 50 - 74%     Toileting Toileting Toileting Activity did not occur (Clothing management and hygiene only): N/A  (no void or bm)  Toileting assist Assist for toileting: Moderate Assistance - Patient 50 - 74%     Transfers Chair/bed transfer  Transfers assist     Chair/bed transfer assist level: Moderate Assistance - Patient 50 - 74%     Locomotion Ambulation   Ambulation assist   Ambulation activity did not occur: Safety/medical concerns          Walk 10 feet activity   Assist  Walk 10 feet activity did not occur: Safety/medical concerns        Walk 50 feet activity   Assist Walk 50 feet with 2 turns activity did not occur: Safety/medical concerns         Walk 150 feet activity   Assist Walk 150 feet activity did not occur: Safety/medical concerns         Walk 10 feet on uneven surface  activity   Assist Walk 10 feet on uneven surfaces activity did not occur: Safety/medical concerns         Wheelchair     Assist Is the patient using a wheelchair?: Yes Type of Wheelchair: Manual    Wheelchair assist level: Supervision/Verbal cueing Max wheelchair distance: 40    Wheelchair 50 feet with 2 turns activity    Assist        Assist Level: Supervision/Verbal cueing   Wheelchair 150 feet activity     Assist      Assist Level: Maximal Assistance - Patient 25 - 49%   Blood pressure 109/79, pulse 80, temperature 97.9 F (36.6 C), resp. rate 20, height 5' 2.99" (1.6 m), weight 111 kg, SpO2 92 %.  Medical Problem List and Plan: 1.  Functional deficits secondary to debility related to new onset a fib and multiple medical             con't CIR/PT and OT 2.  Antithrombotics: -DVT/anticoagulation:  Mechanical: Sequential compression devices, below knee Bilateral lower extremities             -antiplatelet therapy: N/a 3. Pain Management/spasticity: tylenol # 3 prn for pain.              -pt followed by [facility], has had botox in past. Looks as if these are needed again to wrist and finger flexors  11/15- will have pt see Dr Letta Pate for Botox  after d/c.   11/18- has oxycodone for wound care pain   Controlled on 11/19 4. Mood: LCSW to follow for evaluation and support.              -antipsychotic agents: N/A 5. Neuropsych: This patient is capable of making decisions on his own behalf. 6. Skin/Wound Care: Routine pressure relief Mesures.              --  added beneprotein.  7. Fluids/Electrolytes/Nutrition: strict I/Os             --Monitor renal status with serial checks.  8. A fib new onset: Monitor HR TID and for symptoms with increase in activity.             --continue Coreg, Cardizem and amiodarone             --Plans for repeat EGD in 8 weeks for clearance on addition of DOAC  Rate controlled 11/19 9. Gout flare: Continue Colchicine daily for now. Pt says it's improving.  11/16- Sx's resolved as of 11/15- off Prednisone 10. Acute on chronic AKI?: Due to NSAIDS for gout flare. Has resolved with SCr -1.3? Baseline .             -- 1.4 11/12--near baseline---continue to follow  11/17- IVFs 75cc/hour x 24 hours due to renal issues  Creatinine 1.29 on 1/18, labs ordered for Monday  11/21- Cr 1.57 again today- it appears to be higher than baseline- will recheck Wednesday 11. T2DM with hyperglycemia: Hgb A1c-6.7. Monitor BS ac/hs and use SSI for elevated BS.  CBG (last 3)  Recent Labs    12/03/20 0600 12/03/20 1147 12/03/20 1620  GLUCAP 134* 157* 163*    --Added CM restrictions. Continue to hold metformin 11/21- don't want to give PO DM meds due to renal issues- will d/w pharmacy in AM 12. Chronic diastolic CHF: Monitor I/O. Check daily weights.  --Will monitor for signs of overload. On Lipitor.              --Ace on hold due to recent AKI.  13. Multiple gastric/colonic ulcers: On PPI BID.             --monitor for signs of bleeding.              Hemoglobin 10.1 on 11/14, labs ordered for Monday  Will con't to monitor  14.  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   Loose stools likely colchicine, gout flare responding to prednisone , will d/ced colchicine   11/15- doing better-con't to monitor 16. Pressure ulcers on backside  11/15- has unstageable full thickness on coccyx - will con't local wound care- if doesn't improve, will call WOC- seen by our WTA.   11/16- hydrotherapy ordered  11/18- added oxy for pain from wound  11/21- increased granulation and less slough- changed to 3x/week.   LOS: 10 days A FACE TO FACE EVALUATION WAS PERFORMED  Ciella Obi 12/03/2020, 6:08 PM

## 2020-12-03 NOTE — Progress Notes (Signed)
Physical Therapy Wound Treatment Patient Details  Name: [name] MRN: [id number] Date of Birth: November 06, 1965  Today's Date: 12/03/2020 PT Individual Time: 9211-9417 PT Individual Time Calculation (min): 35 min   Subjective  Subjective: pt reporting it "looks like pizza," referring to picture of wound Patient and Family Stated Goals: pt didn't state Date of Onset:  (unknow) Prior Treatments:  (unknown)  Pain Score:  6/10  Wound Assessment  Pressure Injury 11/23/20 Coccyx Right;Lower;Medial Unstageable - Full thickness tissue loss in which the base of the injury is covered by slough (yellow, tan, gray, green or brown) and/or eschar (tan, brown or black) in the wound bed. Yellow area of slou (Active)  Wound Image   12/03/20 1705  Dressing Type Barrier Film (skin prep);Foam - Lift dressing to assess site every shift;Gauze (Comment);Moist to dry;Santyl 12/03/20 1705  Dressing Changed;Clean;Dry;Intact 12/03/20 1705  Dressing Change Frequency Twice a day 12/03/20 1705  State of Healing Early/partial granulation 12/03/20 1705  Site / Wound Assessment Yellow;Pink 12/03/20 1705  % Wound base Red or Granulating 50% 12/03/20 1705  % Wound base Yellow/Fibrinous Exudate 50% 12/03/20 1705  % Wound base Black/Eschar 0% 12/03/20 1705  % Wound base Other/Granulation Tissue (Comment) 0% 12/03/20 1705  Peri-wound Assessment Erythema (blanchable) 12/03/20 1705  Wound Length (cm) 5 cm 11/28/20 1556  Wound Width (cm) 3.5 cm 11/28/20 1556  Wound Depth (cm) 3 cm 11/28/20 1556  Wound Surface Area (cm^2) 17.5 cm^2 11/28/20 1556  Wound Volume (cm^3) 52.5 cm^3 11/28/20 1556  Margins Unattached edges (unapproximated) 12/03/20 1705  Drainage Amount Minimal 12/03/20 1705  Drainage Description Sanguineous 12/03/20 1705  Treatment Debridement (Selective);Hydrotherapy (Pulse lavage);Packing (Saline gauze) 12/03/20 1705     Pressure Injury 11/23/20 Coccyx Left;Lower;Medial Unstageable - Full thickness  tissue loss in which the base of the injury is covered by slough (yellow, tan, gray, green or brown) and/or eschar (tan, brown or black) in the wound bed. Thick yellow and bro (Active)  Dressing Type Foam - Lift dressing to assess site every shift 12/03/20 0900  Dressing Intact 12/03/20 0900  Dressing Change Frequency Twice a day 12/03/20 0653  State of Healing Non-healing 12/01/20 0922  Site / Wound Assessment Yellow;Pink 12/02/20 0930  % Wound base Red or Granulating 0% 11/26/20 1337  % Wound base Yellow/Fibrinous Exudate 0% 11/26/20 1337  % Wound base Black/Eschar 100% 11/26/20 1337  Peri-wound Assessment Erythema (blanchable) 12/02/20 0930  Wound Length (cm) 5 cm 11/28/20 1000  Wound Width (cm) 3.5 cm 11/28/20 1000  Wound Depth (cm) 0.5 cm 11/28/20 1000  Wound Surface Area (cm^2) 17.5 cm^2 11/28/20 1000  Wound Volume (cm^3) 8.75 cm^3 11/28/20 1000  Margins Unattached edges (unapproximated) 12/03/20 0653  Drainage Amount Minimal 12/02/20 0930  Drainage Description Serous 12/02/20 0930  Treatment Cleansed;Packing (Saline gauze) 12/02/20 0930   Hydrotherapy Pulsed lavage therapy - wound location: medial coccyx to R buttock Pulsed Lavage with Suction (psi): 8 psi Pulsed Lavage with Suction - Normal Saline Used: 1000 mL Pulsed Lavage Tip: Tip with splash shield Selective Debridement Selective Debridement - Location: coccyx to r buttock Selective Debridement - Tools Used: Forceps;Scalpel Selective Debridement - Tissue Removed: yellow eschar   Wound Assessment and Plan  Wound Therapy - Assess/Plan/Recommendations Wound Therapy - Clinical Statement: Wound bed is progressing nicely with hydrothearpy; increased granulation tissue noted this session. Minimal yellow slough debrided. Recommend decrease to 3x/wk frequency; discussed with WOC and MD.  pt will benefit from continued Pulsed Lavage to soften eschar for selective debridement and decrease  the bioburden in the wound. Wound Therapy -  Functional Problem List: functional immobility Factors Delaying/Impairing Wound Healing: Altered sensation;Infection - systemic/local;Immobility Hydrotherapy Plan: Debridement;Dressing change;Patient/family education;Pulsatile lavage with suction Wound Therapy - Frequency: 6X / week Wound Therapy - Current Recommendations: PT Wound Therapy - Follow Up Recommendations: dressing changes by RN  Wound Therapy Goals- Improve the function of patient's integumentary system by progressing the wound(s) through the phases of wound healing (inflammation - proliferation - remodeling) by: Decrease Necrotic Tissue to: 20 % Decrease Necrotic Tissue - Progress: Progressing toward goal Increase Granulation Tissue to: 80% Increase Granulation Tissue - Progress: Progressing toward goal Goals/treatment plan/discharge plan were made with and agreed upon by patient/family: Yes Time For Goal Achievement: 7 days Wound Therapy - Potential for Goals: Good  Goals will be updated until maximal potential achieved or discharge criteria met.  Discharge criteria: when goals achieved, discharge from hospital, MD decision/surgical intervention, no progress towards goals, refusal/missing three consecutive treatments without notification or medical reason.  GP  Wyona Almas, PT, DPT Acute Rehabilitation Services Pager [phone] Office [phone]      Deno Etienne 12/03/2020, 5:09 PM

## 2020-12-03 NOTE — Progress Notes (Signed)
Patient ID: [name], male   DOB: [birth year], 54 y.o.   MRN: [id number]  SW scheduled family education session for 11/28 9am-11am.   Loralee Pacas, MSW, [place] Office: [phone] Cell: [phone] Fax: [phone]

## 2020-12-03 NOTE — Telephone Encounter (Signed)
See encounter

## 2020-12-03 NOTE — Progress Notes (Signed)
Physical Therapy Session Note  Patient Details  Name: [name] MRN: [id number] Date of Birth: 01-27-1965  Today's Date: 12/03/2020 PT Individual Time: 1000-1055 PT Individual Time Calculation (min): 55 min   Short Term Goals: Week 2:  PT Short Term Goal 1 (Week 2): Pt will perform least restrictive transfer with min A consistently PT Short Term Goal 2 (Week 2): Pt will perform sit to stand with LRAD and min A consistently PT Short Term Goal 3 (Week 2): Pt will perform bed mobility with min A consistently  Skilled Therapeutic Interventions/Progress Updates:    Pt received seated in w/c handed off from OT family education session. Pt reports ongoing pain in his buttocks at side of wound, receives pain medication at beginning of session. Pt sits in anterior and R lateral lean position to offload buttocks. Still unable to obtain leg rests for TIS chair to attempt different positioning when OOB. Pt's wife [name] present for family education session, session focus on discussion of progress so far with transfers, demonstration of transfers, and goals for patient. Pt not appropriate for hands-on family training at this time, discussed with wife and CSW and wife to return next week prior to d/c for hands-on family training. Squat pivot transfer w/c to/from mat table with min A needed to the R, mod A to the L. Reviewed sequencing of transfer, head/hips relationship, and weight shift. Sit to stand x 5 reps to [facility] with min A, use of mirror and multimodal cueing for upright posture. Pt continues to stand with flexed knees, flexed trunk due to muscle tightness as well as anxiety. Reviewed deep breathing techniques for anxiety. Pt to return to bed at end of session for hydrotherapy. Squat pivot transfer back to bed with mod A. Sit to supine min A for LLE management. Assisted pt with doffing pants and brief at bed level in preparation for hydrotherapy. Pt left supine in bed in care of rehab tech for  hydrotherapy.  Therapy Documentation Precautions:  Precautions Precautions: Fall Precaution Comments: H/o CVA with residual L hemiparesis; dysarthric bowel incontinence Required Braces or Orthoses:  (L resting hand spint at baseline (variable schedule)) Restrictions Weight Bearing Restrictions: No    Therapy/Group: Individual Therapy  Excell Seltzer, PT, DPT, CSRS  12/03/2020, 12:01 PM

## 2020-12-03 NOTE — Telephone Encounter (Signed)
Contacted pt and made aware that FMLA is ready for pick up

## 2020-12-03 NOTE — Telephone Encounter (Signed)
Pt wife is calling to check on the status of the FMLA paperwork. She says that paperwork was dropped off 11//9/22 Cb-  [phone]

## 2020-12-04 LAB — GLUCOSE, CAPILLARY
Glucose-Capillary: 135 mg/dL — ABNORMAL HIGH (ref 70–99)
Glucose-Capillary: 128 mg/dL — ABNORMAL HIGH (ref 70–99)
Glucose-Capillary: 117 mg/dL — ABNORMAL HIGH (ref 70–99)
Glucose-Capillary: 117 mg/dL — ABNORMAL HIGH (ref 70–99)

## 2020-12-04 NOTE — Patient Care Conference (Signed)
Inpatient RehabilitationTeam Conference and Plan of Care Update Date: 12/04/2020   Time: 11:05 AM    Patient Name: [name]      Medical Record Number: [id number]  Date of Birth: 1965-01-28 Sex: Male         Room/Bed: 5C02C/5C02C-01 Payor Info: Payor: [place] / Plan: BCBS COMM PPO / Product Type: *No Product type* /    Admit Date/Time:  11/23/2020  6:08 PM  Primary Diagnosis:  [facility] Problems: Principal Problem:   Debility Active Problems:   Pressure injury of skin   Acute gastric ulcer   Controlled type 2 diabetes mellitus with hyperglycemia, without long-term current use of insulin ([place])   New onset atrial fibrillation [facility])    Expected Discharge Date: Expected Discharge Date: 12/11/20  Team Members Present: Physician leading conference: Dr. Courtney Heys Social Worker Present: Loralee Pacas, [place] Nurse Present: Dorthula Nettles, RN PT Present: Excell Seltzer, PT OT Present: Lillia Corporal, OT PPS Coordinator present : Gunnar Fusi, SLP     Current Status/Progress Goal Weekly Team Focus  Bowel/Bladder   incontinent bladder, continent bowel  Regain full continence  toilet q 2hr   Swallow/Nutrition/ Hydration             ADL's   LB dress - mod, LB bathe - mod, UB ADL Supervision, squat pivot Min/Mod, Mod toileting  min A overall  BADL retraining, ADL transfers, safety awareness, education, NMR   Mobility   min A bed mobility, mod A squat pivot transfers, Supervision w/c mobility x 100 ft, min A sit to stand to [facility]  downgraded to min A for transfers,w/c level  transfers, sit to stand to [facility] with progression to transfers with HW, pain management   Communication             Safety/Cognition/ Behavioral Observations            Pain   4/10 pain  remain pain free  assess q 4 hr and prn   Skin   unstageable pressure injury to sacrum  Promote healing and prevention of infection.  continue hydrotherapy, assess q shift and prn      Discharge Planning:    Discharging home with spouse. Spouse is on FMLA.  Team Discussion: Patient on low air loss mattress, Roho cushion ordered. Set up with tilt in space. Receiving hydro-therapy 3x/week. Has pain medication available prior to wound treatment. Will work on spasticity at discharge as outpatient. Refused therapy today due to pain. Had early family education with spouse to see where he is at. Schedule another family education prior to discharge. Patient on target to meet rehab goals: no, not meeting goals fast enough. Downgraded goals to min assist.  *See Care Plan and progress notes for long and short-term goals.   Revisions to Treatment Plan:  Not at this time.  Teaching Needs: Family education, medication/pain management, skin/wound care, bladder management, transfer training, etc.  Current Barriers to Discharge: Decreased caregiver support, Home enviroment access/layout, Incontinence, Wound care, Weight, and Medication compliance  Possible Resolutions to Barriers: Family education Follow up PT/OT Recommended DME Wound care education/supplies     Medical Summary Current Status: bottom pain-refused to get OOB this AM due to pain- very passive- set up with tilt in space' new ROHO due to unstageable pressure ulcer- on air mattress;  Barriers to Discharge: Decreased family/caregiver support;Home enviroment access/layout;Medical stability;Weight bearing restrictions;Behavior;Wound care;Medication compliance;Weight;Incontinence  Barriers to Discharge Comments: getting hydrotherapy 3x/week- for backside wound. Possible Resolutions  to Barriers/Weekly Focus: goals min A- not mneeting goals fast enough- really slow; came with pressure ulcer; already downgraded goals-partner says will take pt home- d/c 12/29- next Tuesday- need to encourage usagge of pain meds- need timed toileting for inconitnence.   Continued Need for Acute Rehabilitation Level of Care: The patient  requires daily medical management by a physician with specialized training in physical medicine and rehabilitation for the following reasons: Direction of a multidisciplinary physical rehabilitation program to maximize functional independence : Yes Medical management of patient stability for increased activity during participation in an intensive rehabilitation regime.: Yes Analysis of laboratory values and/or radiology reports with any subsequent need for medication adjustment and/or medical intervention. : Yes   I attest that I was present, lead the team conference, and concur with the assessment and plan of the team.   Cristi Loron 12/04/2020, 3:26 PM

## 2020-12-04 NOTE — Progress Notes (Addendum)
Patient ID: [name], male   DOB: 11-12-1965, 55 y.o.   MRN: [id number]  SW met with pt in room to provide updates from team conference, and d/c date remains 11/29. Pt aware SW to follow-up with his wife [name].  SW spoke with his wife [name] to discuss above.  SW confirms family edu on Monday (11/28) 9am-11am. SW informed will confirm roho cushion will be delivered as expected next week. SW reviewed HHA and services (PT/OT/SN).   Loralee Pacas, MSW, [place] Office: [phone] Cell: [phone] Fax: [phone]

## 2020-12-04 NOTE — Progress Notes (Signed)
Physical Therapy Session Note  Patient Details  Name: [name] MRN: [id number] Date of Birth: [birth year]  Today's Date: 12/04/2020 PT Individual Time: 1400-1430 PT Individual Time Calculation (min): 30 min  and Today's Date: 12/04/2020 PT Missed Time: 60 Minutes Missed Time Reason: Pain  Short Term Goals: Week 2:  PT Short Term Goal 1 (Week 2): Pt will perform least restrictive transfer with min A consistently PT Short Term Goal 2 (Week 2): Pt will perform sit to stand with LRAD and min A consistently PT Short Term Goal 3 (Week 2): Pt will perform bed mobility with min A consistently  Skilled Therapeutic Interventions/Progress Updates:     1st Session: Pt refuses PT at this time due to pain buttocks from wound. RN aware. PT will follow up as appropriate. Pt misses 60 minutes of skilled PT.  2nd Session: Pt received in L sidelying and agreeable to therapy. Reports pain in buttocks improved from this morning. Pt performs supine to sit with bed rails and minA. Several minutes taken sitting at EOB due to head feeling "woozy" and PT threading pans over both legs. Sit to stand with R hemiwalker and maxA. Pt unable to pull pants up so seated rest break taken before 2nd stand with PT providing assist to pull pants up from thighs. Pt then performs stand pivot transfer to [facility] with modA and improved body mechanics, with cues for sequencing, anterior weight transition, and initiation of transfer. WC transport to gym for time management. Pt reports pain in buttock while sitting. No number provided. Pt performs squat pivot to mat with minA/modA, and sit to R sidelying with modA. Pt reports increased pain in this position so PT provides modA for return to sitting. ModA for stand pivot from mat>WC>bed with same multimodal cueing. Pt left in L sidelying and RN aware of pain, which pt thinks is a result of sacral pad pulling loose. All needs within reach  Therapy Documentation Precautions:   Precautions Precautions: Fall Precaution Comments: H/o CVA with residual L hemiparesis; dysarthric bowel incontinence Required Braces or Orthoses:  (L resting hand spint at baseline (variable schedule)) Restrictions Weight Bearing Restrictions: No General: PT Amount of Missed Time (min): 60 Minutes PT Missed Treatment Reason: Pain  Therapy/Group: Individual Therapy  Breck Coons 12/04/2020, 2:47 PM

## 2020-12-04 NOTE — Plan of Care (Signed)
  Problem: RH BLADDER ELIMINATION Goal: RH STG MANAGE BLADDER WITH ASSISTANCE Description STG Manage Bladder With Min  Assistance  Outcome: Not Progressing; incontinence   

## 2020-12-04 NOTE — Progress Notes (Signed)
PROGRESS NOTE   Subjective/Complaints:  Pt reports bottom is hurting due to ulcer- all night- pain meds help, however hasn't taken this AM-  Denies any other issues.   ROS:   Pt denies SOB, abd pain, CP, N/V/C/D, and vision changes   Objective:   No results found. Recent Labs    12/03/20 0457  WBC 8.6  HGB 9.9*  HCT 30.2*  PLT 295    Recent Labs    12/03/20 0457  NA 135  K 4.4  CL 105  CO2 22  GLUCOSE 134*  BUN 18  CREATININE 1.57*  CALCIUM 8.6*    Intake/Output Summary (Last 24 hours) at 12/04/2020 0923 Last data filed at 12/04/2020 0831 Gross per 24 hour  Intake 800 ml  Output 225 ml  Net 575 ml     Pressure Injury 11/23/20 Coccyx Right;Lower;Medial Unstageable - Full thickness tissue loss in which the base of the injury is covered by slough (yellow, tan, gray, green or brown) and/or eschar (tan, brown or black) in the wound bed. Yellow area of slou (Active)  11/23/20 1836  Location: Coccyx  Location Orientation: Right;Lower;Medial  Staging: Unstageable - Full thickness tissue loss in which the base of the injury is covered by slough (yellow, tan, gray, green or brown) and/or eschar (tan, brown or black) in the wound bed.  Wound Description (Comments): Yellow area of slough  Present on Admission: Yes     Pressure Injury 11/23/20 Coccyx Left;Lower;Medial Unstageable - Full thickness tissue loss in which the base of the injury is covered by slough (yellow, tan, gray, green or brown) and/or eschar (tan, brown or black) in the wound bed. Thick yellow and bro (Active)  11/23/20 1836  Location: Coccyx  Location Orientation: Left;Lower;Medial  Staging: Unstageable - Full thickness tissue loss in which the base of the injury is covered by slough (yellow, tan, gray, green or brown) and/or eschar (tan, brown or black) in the wound bed.  Wound Description (Comments): Thick yellow and brown tissue. Unable to  view healthy tissue.  Present on Admission: Yes    Physical Exam: Vital Signs Blood pressure 104/62, pulse 78, temperature 98.1 F (36.7 C), temperature source Oral, resp. rate 16, height 5' 2.99" (1.6 m), weight 111 kg, SpO2 96 %.     General: awake, alert, appropriate, laying on L side in low air loss bed; NAD HENT: conjugate gaze; oropharynx moist CV: regular rate; no JVD Pulmonary: CTA B/L; no W/R/R- good air movement GI: soft, NT, ND, (+)BS Psychiatric: appropriate Neurological: alert  dysarthric; L hemiparesis with spasticity no change-chronic Skin: Warm and dry.  Wounds with dressing intact- sacral ulcer/coccyx- full thickness tissue loss- covered in slough- getting less thick - more granulation seen.  Musc: No edema in extremities.  No tenderness in extremities. Dysarthria Motor: LUE 1+ to 2/5 prox to distal. MAS 3wrist/finger flexors and biceps.  LLE 3-/5 prox to 3/5 distally without  tone, stable   Assessment/Plan: 1. Functional deficits which require 3+ hours per day of interdisciplinary therapy in a comprehensive inpatient rehab setting. Physiatrist is providing close team supervision and 24 hour management of active medical problems listed below. Physiatrist and rehab team continue  to assess barriers to discharge/monitor patient progress toward functional and medical goals  Care Tool:  Bathing    Body parts bathed by patient: Left arm, Chest, Abdomen, Front perineal area, Left upper leg, Right upper leg, Face   Body parts bathed by helper: Buttocks, Right lower leg, Left lower leg     Bathing assist Assist Level: Moderate Assistance - Patient 50 - 74%     Upper Body Dressing/Undressing Upper body dressing   What is the patient wearing?: Pull over shirt    Upper body assist Assist Level: Supervision/Verbal cueing    Lower Body Dressing/Undressing Lower body dressing      What is the patient wearing?: Incontinence brief, Pants     Lower body assist  Assist for lower body dressing: Moderate Assistance - Patient 50 - 74%     Toileting Toileting Toileting Activity did not occur (Clothing management and hygiene only): N/A (no void or bm)  Toileting assist Assist for toileting: Moderate Assistance - Patient 50 - 74%     Transfers Chair/bed transfer  Transfers assist     Chair/bed transfer assist level: Moderate Assistance - Patient 50 - 74%     Locomotion Ambulation   Ambulation assist   Ambulation activity did not occur: Safety/medical concerns          Walk 10 feet activity   Assist  Walk 10 feet activity did not occur: Safety/medical concerns        Walk 50 feet activity   Assist Walk 50 feet with 2 turns activity did not occur: Safety/medical concerns         Walk 150 feet activity   Assist Walk 150 feet activity did not occur: Safety/medical concerns         Walk 10 feet on uneven surface  activity   Assist Walk 10 feet on uneven surfaces activity did not occur: Safety/medical concerns         Wheelchair     Assist Is the patient using a wheelchair?: Yes Type of Wheelchair: Manual    Wheelchair assist level: Supervision/Verbal cueing Max wheelchair distance: 40    Wheelchair 50 feet with 2 turns activity    Assist        Assist Level: Supervision/Verbal cueing   Wheelchair 150 feet activity     Assist      Assist Level: Maximal Assistance - Patient 25 - 49%   Blood pressure 104/62, pulse 78, temperature 98.1 F (36.7 C), temperature source Oral, resp. rate 16, height 5' 2.99" (1.6 m), weight 111 kg, SpO2 96 %.  Medical Problem List and Plan: 1.  Functional deficits secondary to debility related to new onset a fib and multiple medical             Continue CIR- PT, OT - team conference to f/u on progress today 2.  Antithrombotics: -DVT/anticoagulation:  Mechanical: Sequential compression devices, below knee Bilateral lower extremities              -antiplatelet therapy: N/a 3. Pain Management/spasticity: tylenol # 3 prn for pain.              -pt followed by [facility], has had botox in past. Looks as if these are needed again to wrist and finger flexors  11/15- will have pt see Dr Letta Pate for Botox after d/c.   11/18- has oxycodone for wound care pain  11/22- more pain since hydrotherapy yesterday- hasn't taken pain meds yet this AM- con't regimen 4. Mood: LCSW  to follow for evaluation and support.              -antipsychotic agents: N/A 5. Neuropsych: This patient is capable of making decisions on his own behalf. 6. Skin/Wound Care: Routine pressure relief Mesures.              --added beneprotein.  7. Fluids/Electrolytes/Nutrition: strict I/Os             --Monitor renal status with serial checks.  8. A fib new onset: Monitor HR TID and for symptoms with increase in activity.             --continue Coreg, Cardizem and amiodarone             --Plans for repeat EGD in 8 weeks for clearance on addition of DOAC  Rate controlled 11/19 9. Gout flare: Continue Colchicine daily for now. Pt says it's improving.  11/16- Sx's resolved as of 11/15- off Prednisone 10. Acute on chronic AKI?: Due to NSAIDS for gout flare. Has resolved with SCr -1.3? Baseline .             -- 1.4 11/12--near baseline---continue to follow  11/17- IVFs 75cc/hour x 24 hours due to renal issues  Creatinine 1.29 on 1/18, labs ordered for Monday  11/21- Cr 1.57 again today- it appears to be higher than baseline- will recheck Wednesday  11/22- will recheck tomorrow- and treat as required 11. T2DM with hyperglycemia: Hgb A1c-6.7. Monitor BS ac/hs and use SSI for elevated BS.  CBG (last 3)  Recent Labs    12/03/20 1620 12/03/20 2105 12/04/20 0653  GLUCAP 163* 116* 135*    --Added CM restrictions. Continue to hold metformin 11/21- don't want to give PO DM meds due to renal issues- will d/w pharmacy in AM 12. Chronic diastolic CHF: Monitor I/O. Check daily weights.   --Will monitor for signs of overload. On Lipitor.              --Ace on hold due to recent AKI.  13. Multiple gastric/colonic ulcers: On PPI BID.             --monitor for signs of bleeding.              Hemoglobin 10.1 on 11/14, labs ordered for Monday  Will con't to monitor  14.  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  Loose stools likely colchicine, gout flare responding to prednisone , will d/ced colchicine   11/15- doing better-con't to monitor 16. Pressure ulcers on backside  11/15- has unstageable full thickness on coccyx - will con't local wound care- if doesn't improve, will call WOC- seen by our WTA.   11/16- hydrotherapy ordered  11/18- added oxy for pain from wound  11/21- increased granulation and less slough- changed to 3x/week.   LOS: 11 days A FACE TO FACE EVALUATION WAS PERFORMED  [name] 12/04/2020, 9:23 AM

## 2020-12-04 NOTE — Progress Notes (Signed)
Physical Therapy Session Note  Patient Details  Name: [name] MRN: [id number] Date of Birth: 1965-09-25  Today's Date: 12/04/2020 PT Individual Time: 1000-1038 PT Individual Time Calculation (min): 38 min   Short Term Goals: Week 2:  PT Short Term Goal 1 (Week 2): Pt will perform least restrictive transfer with min A consistently PT Short Term Goal 2 (Week 2): Pt will perform sit to stand with LRAD and min A consistently PT Short Term Goal 3 (Week 2): Pt will perform bed mobility with min A consistently  Skilled Therapeutic Interventions/Progress Updates:    Pt received sidelying in bed, reports ongoing pain in his buttocks. Not rated and declines intervention. Obtained TIS chair with leg rests, switched out back rest for one with less contour, and adjusted head rest to appropriate height for patient. Pt declines to trial chair this session and requests to remain in bed for his hydrotherapy which is scheduled after this session. Pt missed 22 min of scheduled therapy session due to pain and refusal. Will attempt to make up missed time as able.  Therapy Documentation Precautions:  Precautions Precautions: Fall Precaution Comments: H/o CVA with residual L hemiparesis; dysarthric bowel incontinence Required Braces or Orthoses:  (L resting hand spint at baseline (variable schedule)) Restrictions Weight Bearing Restrictions: No General: PT Amount of Missed Time (min): 22 Minutes PT Missed Treatment Reason: Pain      Therapy/Group: Individual Therapy   Excell Seltzer, PT, DPT, CSRS  12/04/2020, 12:14 PM

## 2020-12-04 NOTE — Progress Notes (Addendum)
Occupational Therapy Session Note  Patient Details  Name: [name] MRN: [id number] Date of Birth: 02-02-1965  Today's Date: 12/04/2020 OT Individual Time: 9983-3825 OT Individual Time Calculation (min): 54 min    Short Term Goals: Week 2:  OT Short Term Goal 1 (Week 2): Patient will complete 2/3 parts of toileting task with Min A and lateral leans. OT Short Term Goal 2 (Week 2): Patient will complete squat-pivot transfers with Min A. OT Short Term Goal 3 (Week 2): Pt will compete LB dressing with mod A sit<>stand/lateral leans  Skilled Therapeutic Interventions/Progress Updates:  Pt greeted  supine in bed reporting feeling "loopy" from the medicine. Pt declined OOB activity to new TIS despite encouragement and also declined ADL participation.   Pt agreeable to OT intervention from bed level.  Pt preferring to stay in sidelying position d/t pain from sacral wound. Pt completed self ROM/ AROM via elbow flexion/extension. Pt also able to complete shoulder ABD/ADD with stabilization provided at L elbow in sidelying. Performed PROM to digits and wrist and manual massage to thenar eminence to promote extension of digits, also provided hand hygiene on L hand. Worked on [employer] with unweighted dowel rod via shoulder external/internal rotation, and chest presses, 2sets10 reps. Pt very interested in boom whackers ( I.e. 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 Pt reports he would need notes "E", "G" "C" and "F charp" to make a tune.pt left in sidelying with all needs within reach.  Of note pt reports wearing L resting hand splint at home; wife to bring in splint today, posted sign in room to remind nursing to don splint at night; collaborated with OTR on updating POC  Therapy Documentation Precautions:  Precautions Precautions: Fall Precaution Comments: H/o CVA with residual L hemiparesis; dysarthric bowel incontinence Required Braces or  Orthoses:  (L resting hand spint at baseline (variable schedule)) Restrictions Weight Bearing Restrictions: No  Pain: pt reports unrated pain from sacral wound, assisted pt with repositioning as needed for pain mgmt.     Therapy/Group: Individual Therapy  Precious Haws 12/04/2020, 3:37 PM

## 2020-12-05 LAB — GLUCOSE, CAPILLARY
Glucose-Capillary: 131 mg/dL — ABNORMAL HIGH (ref 70–99)
Glucose-Capillary: 126 mg/dL — ABNORMAL HIGH (ref 70–99)
Glucose-Capillary: 138 mg/dL — ABNORMAL HIGH (ref 70–99)
Glucose-Capillary: 110 mg/dL — ABNORMAL HIGH (ref 70–99)

## 2020-12-05 MED ORDER — OXYCODONE HCL 5 MG PO TABS
5.0000 mg | ORAL_TABLET | Freq: Every day | ORAL | Status: DC | PRN
  Administered 2020-12-07 – 2020-12-11 (×2): 5 mg via ORAL
  Filled 2020-12-05 (×2): qty 1

## 2020-12-05 NOTE — Progress Notes (Signed)
Occupational Therapy Session Note  Patient Details  Name: [name] MRN: [id number] Date of Birth: 10-10-1965  Today's Date: 12/05/2020 OT Individual Time:  -       Short Term Goals: Week 1:  OT Short Term Goal 1 (Week 1): Patient will don UB clothing with set-up assist. OT Short Term Goal 1 - Progress (Week 1): Met OT Short Term Goal 2 (Week 1): Patient will complete 2/3 parts of toileting task with Min A and lateral leans. OT Short Term Goal 2 - Progress (Week 1): Progressing toward goal OT Short Term Goal 3 (Week 1): Patient will complete squat-pivot transfers with Min A. OT Short Term Goal 3 - Progress (Week 1): Progressing toward goal Week 2:  OT Short Term Goal 1 (Week 2): Patient will complete 2/3 parts of toileting task with Min A and lateral leans. OT Short Term Goal 2 (Week 2): Patient will complete squat-pivot transfers with Min A. OT Short Term Goal 3 (Week 2): Pt will compete LB dressing with mod A sit<>stand/lateral leans Week 3:     Skilled Therapeutic Interventions/Progress Updates:    Patient in w/c upon arrival, the pt was able to transfer from sit to stand 3x using hemi walker with Mod maintaining good anatomical position for a count of 10 each time. . The pt transfer from the recliner to the w/c with [place] and was able to maneuver the w/c with MinA for adjusting his feet.  The pt was able to wash he face and brush his teeth while seated in the w/c with s/u for accessing the sink area.  The pt completed relaxation breathing to improve his activity tolerance for functional gains with BADL performance. The pt completed UB theraex using a 3lb dumb bell 2 sets of 15 for bicep curls, shld flexion, and horizontal abduction.  The pt transfer to bed with Nadine for coming from w/c to bed for placement and bring his leg onto the bed. The pt was able to transition from supine to side to alleviate some of the pain associated with his bottom.   The pt was resting in bed with his  bedside table and alarm in place, as well as addiitonal need being met.     Therapy Documentation Precautions:  Precautions Precautions: Fall Precaution Comments: H/o CVA with residual L hemiparesis; dysarthric bowel incontinence Required Braces or Orthoses:  (L resting hand spint at baseline (variable schedule)) Restrictions Weight Bearing Restrictions: No General:   Vital Signs:   Pain: Pain Assessment Pain Scale: 0-10 Pain Score: 2  Vision   Perception    Praxis   Exercises:   Other Treatments:     Therapy/Group: Individual Therapy  Yvonne Kendall 12/05/2020, 12:13 PM

## 2020-12-05 NOTE — Progress Notes (Signed)
Physical Therapy Wound Treatment Patient Details  Name: AZEKIEL CREMER MRN: [id number] Date of Birth: 01/29/1965  Today's Date: 12/05/2020 PT Individual Time: 0177-9390 PT Individual Time Calculation (min): 22 min   Subjective  Subjective: pt reporting more intense pain. Patient and Family Stated Goals: pt didn't state Date of Onset:  (unknow) Prior Treatments:  (unknown)  Pain Score: pain   6-8/10 during debridement in hydrotherapy  Wound Assessment  Pressure Injury 11/23/20 Coccyx Right;Lower;Medial Unstageable - Full thickness tissue loss in which the base of the injury is covered by slough (yellow, tan, gray, green or brown) and/or eschar (tan, brown or black) in the wound bed. Yellow area of slou (Active)  Wound Image   12/03/20 1705  Dressing Type Barrier Film (skin prep);Foam - Lift dressing to assess site every shift;Gauze (Comment);Moist to dry;Normal saline moist dressing;Santyl 12/05/20 1128  Dressing Changed 12/05/20 1128  Dressing Change Frequency Twice a day 12/05/20 1128  State of Healing Early/partial granulation 12/05/20 1128  Site / Wound Assessment Pink;Yellow 12/05/20 1128  % Wound base Red or Granulating 55% 12/05/20 1128  % Wound base Yellow/Fibrinous Exudate 45% 12/05/20 1128  % Wound base Black/Eschar 0% 12/05/20 1128  % Wound base Other/Granulation Tissue (Comment) 0% 12/05/20 1128  Peri-wound Assessment Erythema (blanchable);Induration 12/05/20 1128  Wound Length (cm) 5 cm 11/28/20 1556  Wound Width (cm) 3.5 cm 11/28/20 1556  Wound Depth (cm) 3 cm 11/28/20 1556  Wound Surface Area (cm^2) 17.5 cm^2 11/28/20 1556  Wound Volume (cm^3) 52.5 cm^3 11/28/20 1556  Margins Unattached edges (unapproximated) 12/04/20 0854  Drainage Amount Minimal 12/05/20 1128  Drainage Description Serous;Sanguineous 12/05/20 1128  Treatment Cleansed;Debridement (Selective);Hydrotherapy (Pulse lavage);Packing (Saline gauze) 12/05/20 1128      Hydrotherapy Pulsed lavage  therapy - wound location: medial coccyx to R buttock Pulsed Lavage with Suction (psi): 8 psi Pulsed Lavage with Suction - Normal Saline Used: 1000 mL Pulsed Lavage Tip: Tip with splash shield Selective Debridement Selective Debridement - Location: coccyx to r buttock Selective Debridement - Tools Used: Forceps;Scalpel Selective Debridement - Tissue Removed: yellow eschar   Wound Assessment and Plan  Wound Therapy - Assess/Plan/Recommendations Wound Therapy - Clinical Statement: Wound bed is progressing nicely with hydrothearpy; increased granulation tissue noted this session. Minimal yellow slough debrided. Recommend decrease to 3x/wk frequency; discussed with WOC and MD.  pt will benefit from continued Pulsed Lavage to soften eschar for selective debridement and decrease the bioburden in the wound. Wound Therapy - Functional Problem List: functional immobility Factors Delaying/Impairing Wound Healing: Altered sensation;Infection - systemic/local;Immobility Hydrotherapy Plan: Debridement;Dressing change;Patient/family education;Pulsatile lavage with suction Wound Therapy - Frequency: 6X / week Wound Therapy - Current Recommendations: PT Wound Therapy - Follow Up Recommendations: dressing changes by RN  Wound Therapy Goals- Improve the function of patient's integumentary system by progressing the wound(s) through the phases of wound healing (inflammation - proliferation - remodeling) by: Decrease Necrotic Tissue to: 20 % Decrease Necrotic Tissue - Progress: Progressing toward goal Increase Granulation Tissue to: 80% Increase Granulation Tissue - Progress: Progressing toward goal Goals/treatment plan/discharge plan were made with and agreed upon by patient/family: Yes Time For Goal Achievement: 7 days Wound Therapy - Potential for Goals: Good  Goals will be updated until maximal potential achieved or discharge criteria met.  Discharge criteria: when goals achieved, discharge from hospital,  MD decision/surgical intervention, no progress towards goals, refusal/missing three consecutive treatments without notification or medical reason.  GP     Tessie Fass Mottinger 12/05/2020, 11:32 AM 12/05/2020  [name]  M., PT Acute Rehabilitation Services [phone]  (pager) [phone]  (office)

## 2020-12-05 NOTE — Progress Notes (Signed)
Physical Therapy Session Note  Patient Details  Name: [name] MRN: [id number] Date of Birth: 21-Nov-1965  Today's Date: 12/05/2020 PT Individual Time: 3419-6222; 9798-9211 PT Individual Time Calculation (min): 55 min 15 min PT Missed Time: 30 min Missed Time Reason: pain; fatigue; unwilling to participate  Short Term Goals: Week 2:  PT Short Term Goal 1 (Week 2): Pt will perform least restrictive transfer with min A consistently PT Short Term Goal 2 (Week 2): Pt will perform sit to stand with LRAD and min A consistently PT Short Term Goal 3 (Week 2): Pt will perform bed mobility with min A consistently  Skilled Therapeutic Interventions/Progress Updates:    Session 1: Pt received in L sidelying in bed, agreeable to PT session. Pt reports ongoing pain in his buttocks, not rated, premedicated prior to start of therapy session. Sidelying to sitting EOB with mod A needed for some LE management and trunk elevation. Pt is mod A to change his shirt while seated EOB, total A for donning pants. Sit to stand with min A to [facility], dependent to pull pants up over hips in standing. Squat pivot transfer to TIS with mod A to the R. Dependent transport to/from therapy gym via [facility] chair. Squat pivot transfer w/c to/from mat table with mod A. Sit to stand x 5 reps to [facility] with min A, use of mirror for visual feedback. Pt exhibits improved ability to stand more upright this date. Stand pivot transfer to the L mat table to w/c with HW and max A due to onset of anxiety during transfer and fear of falling. Pt requires max A to safely complete transfer to chair. Pt left semi-reclined in TIS chair in room with needs in reach at end of session.  Session 2: Pt received in L sidelying in bed, declines participation in therapy session this PM due to his buttocks being sore from wound care and due to fatigue. Encouraged pt to participate in therapy session as he only has a few days of therapy left prior to d/c home, pt  again declines. Pt agreeable to reposition onto his R side in bed. L sidelying to supine to R sidelying with min A. Pt left in R sidelying in bed with needs in reach. Pt missed 30 min of scheduled therapy session due to refusal.  Therapy Documentation Precautions:  Precautions Precautions: Fall Precaution Comments: H/o CVA with residual L hemiparesis; dysarthric bowel incontinence Required Braces or Orthoses:  (L resting hand spint at baseline (variable schedule)) Restrictions Weight Bearing Restrictions: No      Therapy/Group: Individual Therapy   Excell Seltzer, PT, DPT, CSRS  12/05/2020, 11:57 AM

## 2020-12-05 NOTE — Progress Notes (Signed)
PROGRESS NOTE   Subjective/Complaints:  Pt has been refusing pain meds per nursing- even though having pain-  Pt reports buttocks hurting and per therapy has refused some therapy as a result- will sit on L side, regardless if eating or not per nursing-  Went over with pt and nursing at bedside that he needs to take the pain meds, to help control pain.   Will also order Oxy daily prn for hydrotherapy so gets improved pain control.  ROS:   Pt denies SOB, abd pain, CP, N/V/C/D, and vision changes   Objective:   No results found. Recent Labs    12/03/20 0457  WBC 8.6  HGB 9.9*  HCT 30.2*  PLT 295    Recent Labs    12/03/20 0457  NA 135  K 4.4  CL 105  CO2 22  GLUCOSE 134*  BUN 18  CREATININE 1.57*  CALCIUM 8.6*    Intake/Output Summary (Last 24 hours) at 12/05/2020 0830 Last data filed at 12/05/2020 0545 Gross per 24 hour  Intake 960 ml  Output 600 ml  Net 360 ml     Pressure Injury 11/23/20 Coccyx Right;Lower;Medial Unstageable - Full thickness tissue loss in which the base of the injury is covered by slough (yellow, tan, gray, green or brown) and/or eschar (tan, brown or black) in the wound bed. Yellow area of slou (Active)  11/23/20 1836  Location: Coccyx  Location Orientation: Right;Lower;Medial  Staging: Unstageable - Full thickness tissue loss in which the base of the injury is covered by slough (yellow, tan, gray, green or brown) and/or eschar (tan, brown or black) in the wound bed.  Wound Description (Comments): Yellow area of slough  Present on Admission: Yes     Pressure Injury 11/23/20 Coccyx Left;Lower;Medial Unstageable - Full thickness tissue loss in which the base of the injury is covered by slough (yellow, tan, gray, green or brown) and/or eschar (tan, brown or black) in the wound bed. Thick yellow and bro (Active)  11/23/20 1836  Location: Coccyx  Location Orientation: Left;Lower;Medial   Staging: Unstageable - Full thickness tissue loss in which the base of the injury is covered by slough (yellow, tan, gray, green or brown) and/or eschar (tan, brown or black) in the wound bed.  Wound Description (Comments): Thick yellow and brown tissue. Unable to view healthy tissue.  Present on Admission: Yes    Physical Exam: Vital Signs Blood pressure 110/62, pulse 73, temperature 98.4 F (36.9 C), temperature source Oral, resp. rate 16, height 5' 2.99" (1.6 m), weight 107 kg, SpO2 94 %.      General: awake, alert, appropriate, laying on L side in bed; dysarthric- chronic- nursing with me at bedside; NAD HENT: conjugate gaze; oropharynx moist CV: regular rate; no JVD Pulmonary: CTA B/L; no W/R/R- good air movement GI: soft, NT, ND, (+)BS Psychiatric: appropriate- depressed affect Neurological: alert  dysarthric; L hemiparesis with spasticity no change Skin: Warm and dry.  Wounds with dressing intact- sacral ulcer/coccyx- full thickness tissue loss- covered in slough- getting less thick - more granulation seen.  Musc: No edema in extremities.  No tenderness in extremities. Dysarthria Motor: LUE 1+ to 2/5 prox to distal.  MAS 3wrist/finger flexors and biceps.  LLE 3-/5 prox to 3/5 distally without  tone, stable   Assessment/Plan: 1. Functional deficits which require 3+ hours per day of interdisciplinary therapy in a comprehensive inpatient rehab setting. Physiatrist is providing close team supervision and 24 hour management of active medical problems listed below. Physiatrist and rehab team continue to assess barriers to discharge/monitor patient progress toward functional and medical goals  Care Tool:  Bathing    Body parts bathed by patient: Left arm, Chest, Abdomen, Front perineal area, Left upper leg, Right upper leg, Face   Body parts bathed by helper: Buttocks, Right lower leg, Left lower leg     Bathing assist Assist Level: Moderate Assistance - Patient 50 - 74%      Upper Body Dressing/Undressing Upper body dressing   What is the patient wearing?: Pull over shirt    Upper body assist Assist Level: Supervision/Verbal cueing    Lower Body Dressing/Undressing Lower body dressing      What is the patient wearing?: Incontinence brief, Pants     Lower body assist Assist for lower body dressing: Moderate Assistance - Patient 50 - 74%     Toileting Toileting Toileting Activity did not occur (Clothing management and hygiene only): N/A (no void or bm)  Toileting assist Assist for toileting: Moderate Assistance - Patient 50 - 74%     Transfers Chair/bed transfer  Transfers assist     Chair/bed transfer assist level: Moderate Assistance - Patient 50 - 74%     Locomotion Ambulation   Ambulation assist   Ambulation activity did not occur: Safety/medical concerns          Walk 10 feet activity   Assist  Walk 10 feet activity did not occur: Safety/medical concerns        Walk 50 feet activity   Assist Walk 50 feet with 2 turns activity did not occur: Safety/medical concerns         Walk 150 feet activity   Assist Walk 150 feet activity did not occur: Safety/medical concerns         Walk 10 feet on uneven surface  activity   Assist Walk 10 feet on uneven surfaces activity did not occur: Safety/medical concerns         Wheelchair     Assist Is the patient using a wheelchair?: Yes Type of Wheelchair: Manual    Wheelchair assist level: Supervision/Verbal cueing Max wheelchair distance: 40    Wheelchair 50 feet with 2 turns activity    Assist        Assist Level: Supervision/Verbal cueing   Wheelchair 150 feet activity     Assist      Assist Level: Maximal Assistance - Patient 25 - 49%   Blood pressure 110/62, pulse 73, temperature 98.4 F (36.9 C), temperature source Oral, resp. rate 16, height 5' 2.99" (1.6 m), weight 107 kg, SpO2 94 %.  Medical Problem List and Plan: 1.   Functional deficits secondary to debility related to new onset a fib and multiple medical            con't PT and OT-advised pt have to work with therapy- CIR- d/c 11/ 11/29- moved d/c date 2.  Antithrombotics: -DVT/anticoagulation:  Mechanical: Sequential compression devices, below knee Bilateral lower extremities             -antiplatelet therapy: N/a 3. Pain Management/spasticity: tylenol # 3 prn for pain.              -  pt followed by [facility], has had botox in past. Looks as if these are needed again to wrist and finger flexors  11/15- will have pt see Dr Letta Pate for Botox after d/c.   11/18- has oxycodone for wound care pain  11/22- more pain since hydrotherapy yesterday- hasn't taken pain meds yet this AM- con't regimen  11/23- not taking pain meds- per nursing- advised pt to ask for them- to get pain controlled- also gave him an additional oxy daily for hydrotherapy.  4. Mood: LCSW to follow for evaluation and support.              -antipsychotic agents: N/A 5. Neuropsych: This patient is capable of making decisions on his own behalf. 6. Skin/Wound Care: Routine pressure relief Mesures.              --added beneprotein.  7. Fluids/Electrolytes/Nutrition: strict I/Os             --Monitor renal status with serial checks.  8. A fib new onset: Monitor HR TID and for symptoms with increase in activity.             --continue Coreg, Cardizem and amiodarone             --Plans for repeat EGD in 8 weeks for clearance on addition of DOAC  Rate controlled 11/19 9. Gout flare: Continue Colchicine daily for now. Pt says it's improving.  11/16- Sx's resolved as of 11/15- off Prednisone 10. Acute on chronic AKI?: Due to NSAIDS for gout flare. Has resolved with SCr -1.3? Baseline .             -- 1.4 11/12--near baseline---continue to follow  11/17- IVFs 75cc/hour x 24 hours due to renal issues  Creatinine 1.29 on 1/18, labs ordered for Monday  11/21- Cr 1.57 again today- it appears to be higher  than baseline- will recheck Wednesday  11/23- Will recheck BMP in AM 11. T2DM with hyperglycemia: Hgb A1c-6.7. Monitor BS ac/hs and use SSI for elevated BS.  CBG (last 3)  Recent Labs    12/04/20 1649 12/04/20 2053 12/05/20 0618  GLUCAP 117* 117* 131*    --Added CM restrictions. Continue to hold metformin 11/23- BG's look better- con't regimen off Metformin 12. Chronic diastolic CHF: Monitor I/O. Check daily weights.  --Will monitor for signs of overload. On Lipitor.              --Ace on hold due to recent AKI.  13. Multiple gastric/colonic ulcers: On PPI BID.             --monitor for signs of bleeding.              Hemoglobin 10.1 on 11/14, labs ordered for Monday  Will con't to monitor  14.  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  Loose stools likely colchicine, gout flare responding to prednisone , will d/ced colchicine   11/15- doing better-con't to monitor 16. Pressure ulcers on backside  11/15- has unstageable full thickness on coccyx - will con't local wound care- if doesn't improve, will call WOC- seen by our WTA.   11/16- hydrotherapy ordered  11/18- added oxy for pain from wound  11/21- increased granulation and less slough- changed to 3x/week.   11/23- added Oxy prn for hydrotherapy specifically.   LOS: 12 days A FACE TO FACE EVALUATION WAS PERFORMED  [name] 12/05/2020, 8:30 AM

## 2020-12-06 LAB — GLUCOSE, CAPILLARY
Glucose-Capillary: 115 mg/dL — ABNORMAL HIGH (ref 70–99)
Glucose-Capillary: 143 mg/dL — ABNORMAL HIGH (ref 70–99)
Glucose-Capillary: 123 mg/dL — ABNORMAL HIGH (ref 70–99)
Glucose-Capillary: 148 mg/dL — ABNORMAL HIGH (ref 70–99)

## 2020-12-06 LAB — BASIC METABOLIC PANEL
Anion gap: 9 (ref 5–15)
BUN: 24 mg/dL — ABNORMAL HIGH (ref 6–20)
CO2: 23 mmol/L (ref 22–32)
Calcium: 8.9 mg/dL (ref 8.9–10.3)
Chloride: 102 mmol/L (ref 98–111)
Creatinine, Ser: 1.64 mg/dL — ABNORMAL HIGH (ref 0.61–1.24)
GFR, Estimated: 49 mL/min — ABNORMAL LOW (ref >60)
Glucose, Bld: 131 mg/dL — ABNORMAL HIGH (ref 70–99)
Potassium: 5.1 mmol/L (ref 3.5–5.1)
Sodium: 134 mmol/L — ABNORMAL LOW (ref 135–145)

## 2020-12-06 MED ORDER — SODIUM CHLORIDE 0.9 % IV SOLN: INTRAVENOUS | Status: AC

## 2020-12-06 NOTE — Progress Notes (Signed)
PROGRESS NOTE   Subjective/Complaints:  Pt says pain doing better when takes pain meds- LBM yesterday.   K+ 5.1 and Cr up to 1.64; BUN 24- pt says drinking, but need to give IVFs again  ROS:   Pt denies SOB, abd pain, CP, N/V/C/D, and vision changes    Objective:   No results found. No results for input(s): WBC, HGB, HCT, PLT in the last 72 hours.   Recent Labs    12/06/20 0506  NA 134*  K 5.1  CL 102  CO2 23  GLUCOSE 131*  BUN 24*  CREATININE 1.64*  CALCIUM 8.9    Intake/Output Summary (Last 24 hours) at 12/06/2020 0807 Last data filed at 12/06/2020 0359 Gross per 24 hour  Intake 240 ml  Output 450 ml  Net -210 ml     Pressure Injury 11/23/20 Coccyx Right;Lower;Medial Unstageable - Full thickness tissue loss in which the base of the injury is covered by slough (yellow, tan, gray, green or brown) and/or eschar (tan, brown or black) in the wound bed. Yellow area of slou (Active)  11/23/20 1836  Location: Coccyx  Location Orientation: Right;Lower;Medial  Staging: Unstageable - Full thickness tissue loss in which the base of the injury is covered by slough (yellow, tan, gray, green or brown) and/or eschar (tan, brown or black) in the wound bed.  Wound Description (Comments): Yellow area of slough  Present on Admission: Yes     Pressure Injury 11/23/20 Coccyx Left;Lower;Medial Unstageable - Full thickness tissue loss in which the base of the injury is covered by slough (yellow, tan, gray, green or brown) and/or eschar (tan, brown or black) in the wound bed. Thick yellow and bro (Active)  11/23/20 1836  Location: Coccyx  Location Orientation: Left;Lower;Medial  Staging: Unstageable - Full thickness tissue loss in which the base of the injury is covered by slough (yellow, tan, gray, green or brown) and/or eschar (tan, brown or black) in the wound bed.  Wound Description (Comments): Thick yellow and brown  tissue. Unable to view healthy tissue.  Present on Admission: Yes    Physical Exam: Vital Signs Blood pressure 108/66, pulse 79, temperature 98.2 F (36.8 C), temperature source Oral, resp. rate 16, height 5' 2.99" (1.6 m), weight 119 kg, SpO2 92 %.      General: awake, alert, appropriate, laying on L side;  NAD HENT: conjugate gaze; oropharynx moist CV: regular rate; no JVD Pulmonary: CTA B/L; no W/R/R- good air movement GI: soft, NT, ND, (+)BS Psychiatric: appropriate Neurological: alert;  dysarthric; L hemiparesis with spasticity no change Skin:  (+) tenting Warm and dry.  Wounds with dressing intact- sacral ulcer/coccyx- full thickness tissue loss- covered in slough- getting less thick - more granulation seen.  Musc: No edema in extremities.  No tenderness in extremities. Dysarthria Motor: LUE 1+ to 2/5 prox to distal. MAS 3wrist/finger flexors and biceps.  LLE 3-/5 prox to 3/5 distally without  tone, stable   Assessment/Plan: 1. Functional deficits which require 3+ hours per day of interdisciplinary therapy in a comprehensive inpatient rehab setting. Physiatrist is providing close team supervision and 24 hour management of active medical problems listed below. Physiatrist and rehab team  continue to assess barriers to discharge/monitor patient progress toward functional and medical goals  Care Tool:  Bathing    Body parts bathed by patient: Left arm, Chest, Abdomen, Front perineal area, Left upper leg, Right upper leg, Face   Body parts bathed by helper: Buttocks, Right lower leg, Left lower leg     Bathing assist Assist Level: Moderate Assistance - Patient 50 - 74%     Upper Body Dressing/Undressing Upper body dressing   What is the patient wearing?: Pull over shirt    Upper body assist Assist Level: Supervision/Verbal cueing    Lower Body Dressing/Undressing Lower body dressing      What is the patient wearing?: Incontinence brief, Pants     Lower body  assist Assist for lower body dressing: Moderate Assistance - Patient 50 - 74%     Toileting Toileting Toileting Activity did not occur (Clothing management and hygiene only): N/A (no void or bm)  Toileting assist Assist for toileting: Moderate Assistance - Patient 50 - 74%     Transfers Chair/bed transfer  Transfers assist     Chair/bed transfer assist level: Moderate Assistance - Patient 50 - 74%     Locomotion Ambulation   Ambulation assist   Ambulation activity did not occur: Safety/medical concerns          Walk 10 feet activity   Assist  Walk 10 feet activity did not occur: Safety/medical concerns        Walk 50 feet activity   Assist Walk 50 feet with 2 turns activity did not occur: Safety/medical concerns         Walk 150 feet activity   Assist Walk 150 feet activity did not occur: Safety/medical concerns         Walk 10 feet on uneven surface  activity   Assist Walk 10 feet on uneven surfaces activity did not occur: Safety/medical concerns         Wheelchair     Assist Is the patient using a wheelchair?: Yes Type of Wheelchair: Manual    Wheelchair assist level: Supervision/Verbal cueing Max wheelchair distance: 40    Wheelchair 50 feet with 2 turns activity    Assist        Assist Level: Supervision/Verbal cueing   Wheelchair 150 feet activity     Assist      Assist Level: Maximal Assistance - Patient 25 - 49%   Blood pressure 108/66, pulse 79, temperature 98.2 F (36.8 C), temperature source Oral, resp. rate 16, height 5' 2.99" (1.6 m), weight 119 kg, SpO2 92 %.  Medical Problem List and Plan: 1.  Functional deficits secondary to debility related to new onset a fib and multiple medical            con't PT and OT/ CIR- d/c 11/ 11/29- moved d/c date 2.  Antithrombotics: -DVT/anticoagulation:  Mechanical: Sequential compression devices, below knee Bilateral lower extremities             -antiplatelet  therapy: N/a 3. Pain Management/spasticity: tylenol # 3 prn for pain.              -pt followed by [facility], has had botox in past. Looks as if these are needed again to wrist and finger flexors  11/15- will have pt see Dr Letta Pate for Botox after d/c.   11/23- not taking pain meds- per nursing- advised pt to ask for them- to get pain controlled- also gave him an additional oxy daily for hydrotherapy.  11/24- pain doing better- con't regimen 4. Mood: LCSW to follow for evaluation and support.              -antipsychotic agents: N/A 5. Neuropsych: This patient is capable of making decisions on his own behalf. 6. Skin/Wound Care: Routine pressure relief Mesures.              --added beneprotein.  7. Fluids/Electrolytes/Nutrition: strict I/Os             --Monitor renal status with serial checks.  8. A fib new onset: Monitor HR TID and for symptoms with increase in activity.             --continue Coreg, Cardizem and amiodarone             --Plans for repeat EGD in 8 weeks for clearance on addition of DOAC  Rate controlled 11/19 9. Gout flare: Continue Colchicine daily for now. Pt says it's improving.  11/16- Sx's resolved as of 11/15- off Prednisone 10. Acute on chronic AKI?: Due to NSAIDS for gout flare. Has resolved with SCr -1.3? Baseline .             -- 1.4 11/12--near baseline---continue to follow  11/17- IVFs 75cc/hour x 24 hours due to renal issues  Creatinine 1.29 on 1/18, labs ordered for Monday  11/21- Cr 1.57 again today- it appears to be higher than baseline- will recheck Wednesday  11/23- Will recheck BMP in AM  11/24- Cr 1.64- will give IVFs again and recheck in AM-  11. T2DM with hyperglycemia: Hgb A1c-6.7. Monitor BS ac/hs and use SSI for elevated BS.  CBG (last 3)  Recent Labs    12/05/20 1629 12/05/20 2020 12/06/20 0636  GLUCAP 138* 110* 115*    --Added CM restrictions. Continue to hold metformin 11/23- BG's look better- con't regimen off Metformin 12. Chronic  diastolic CHF: Monitor I/O. Check daily weights.  --Will monitor for signs of overload. On Lipitor.              --Ace on hold due to recent AKI.  13. Multiple gastric/colonic ulcers: On PPI BID.             --monitor for signs of bleeding.              Hemoglobin 10.1 on 11/14, labs ordered for Monday  Will con't to monitor  14.  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  Loose stools likely colchicine, gout flare responding to prednisone , will d/ced colchicine   11/15- doing better-con't to monitor 16. Pressure ulcers on backside  11/15- has unstageable full thickness on coccyx - will con't local wound care- if doesn't improve, will call WOC- seen by our WTA.   11/16- hydrotherapy ordered  11/18- added oxy for pain from wound  11/21- increased granulation and less slough- changed to 3x/week.   11/23- added Oxy prn for hydrotherapy specifically.  17. Hyperkalemia  11/24- K+ 5.1- will see if hemolysis- recheck in AM  LOS: 13 days A FACE TO FACE EVALUATION WAS PERFORMED  Allysen Lazo 12/06/2020, 8:07 AM

## 2020-12-06 NOTE — Progress Notes (Signed)
Occupational Therapy Session Note  Patient Details  Name: [name] MRN: [id number] Date of Birth: 08-18-1965  Today's Date: 12/07/2020 OT Individual Time: 1011-1027 OT Individual Time Calculation (min): 16 min  44 minutes missed  Skilled Therapeutic Interventions/Progress Updates:    Pt greeted in the TIS, seeming down mood-wise. Suggested several activities we could do together in therapy however pt declined, wanting to return to bed to rest before hydrotherapy. Reporting that pain was worse today, didn't sleep, and  was just not feeling well. Waited for +2 assist before transferring pt back to bed using Stedy with Min A of 2 for safety. +2 for returning to sidelying position for pressure relief. Advised NT to help pt in Rt sidelying position later in the day to protect his Lt shoulder. Pt left in care of MD and RN for further assessment. Tx time missed due to pt refusal/not feeling well.   Therapy Documentation Precautions:  Precautions Precautions: Fall Precaution Comments: H/o CVA with residual L hemiparesis; dysarthric bowel incontinence Required Braces or Orthoses:  (L resting hand spint at baseline (variable schedule)) Restrictions Weight Bearing Restrictions: No Pain: wound pain Pain Assessment Pain Scale: 0-10 Pain Score: 6  Pain Type: Acute pain Pain Location: Sacrum Pain Descriptors / Indicators: Aching Pain Frequency: Constant Pain Onset: On-going Patients Stated Pain Goal: 4 Pain Intervention(s): Medication (See eMAR) ADL: ADL Eating: Set up Where Assessed-Eating: Chair Grooming: Not assessed Upper Body Bathing: Minimal assistance Where Assessed-Upper Body Bathing: Chair Lower Body Bathing: Moderate assistance Where Assessed-Lower Body Bathing: Other (Comment) (Stedy) Upper Body Dressing: Minimal assistance Where Assessed-Upper Body Dressing: Chair Lower Body Dressing: Maximal assistance Where Assessed-Lower Body Dressing: Chair Toileting: Not  assessed Toilet Transfer Method: Not assessed Tub/Shower Transfer: Not assessed  Therapy/Group: Individual Therapy  Mickie Kozikowski A Shantee Hayne 12/07/2020, 12:26 PM

## 2020-12-07 LAB — GLUCOSE, CAPILLARY
Glucose-Capillary: 128 mg/dL — ABNORMAL HIGH (ref 70–99)
Glucose-Capillary: 134 mg/dL — ABNORMAL HIGH (ref 70–99)
Glucose-Capillary: 166 mg/dL — ABNORMAL HIGH (ref 70–99)

## 2020-12-07 LAB — BASIC METABOLIC PANEL
Anion gap: 7 (ref 5–15)
BUN: 21 mg/dL — ABNORMAL HIGH (ref 6–20)
CO2: 23 mmol/L (ref 22–32)
Calcium: 8.6 mg/dL — ABNORMAL LOW (ref 8.9–10.3)
Chloride: 105 mmol/L (ref 98–111)
Creatinine, Ser: 1.5 mg/dL — ABNORMAL HIGH (ref 0.61–1.24)
GFR, Estimated: 55 mL/min — ABNORMAL LOW (ref >60)
Glucose, Bld: 133 mg/dL — ABNORMAL HIGH (ref 70–99)
Potassium: 5 mmol/L (ref 3.5–5.1)
Sodium: 135 mmol/L (ref 135–145)

## 2020-12-07 MED ORDER — TRAZODONE HCL 50 MG PO TABS
75.0000 mg | ORAL_TABLET | Freq: Every day | ORAL | Status: DC
  Administered 2020-12-07 – 2020-12-13 (×7): 75 mg via ORAL
  Filled 2020-12-07 (×7): qty 2

## 2020-12-07 NOTE — Progress Notes (Signed)
Physical Therapy Session Note  Patient Details  Name: [name] MRN: [id number] Date of Birth: [birth year]  Today's Date: 12/07/2020 PT Individual Time: 0822-0940 PT Individual Time Calculation (min): 78 min   Short Term Goals: Week 2:  PT Short Term Goal 1 (Week 2): Pt will perform least restrictive transfer with min A consistently PT Short Term Goal 2 (Week 2): Pt will perform sit to stand with LRAD and min A consistently PT Short Term Goal 3 (Week 2): Pt will perform bed mobility with min A consistently  Skilled Therapeutic Interventions/Progress Updates:  Pt received in L sidelying in bed, reports he is not feeling well today. Pt attributes not feeling well to ongoing pain in buttocks, reports being premedicated prior to start of therapy session. Pt also reports discomfort from site of his buttocks dressing, dressing assessed and remains intact and in place, pt likely experiencing sensation from packing of wound. Pt found to have smear of bowel, dependent for pericare and dressing change. Pt given option of donning pants at bed level or standing from EOB, pt request to don pants EOB. Supine to sit with max A required this date for LE management and trunk elevation. Pt requires increased assist this date with onset of R lateral and anterior lean in sitting position. Assisted pt with threading LE through pant legs and donning shoes while seated EOB. Sit to stand with mod A to [place], pt unable to achieve upright stance enough to pull pants up over hips. Obtained stedy due to increased difficulty with maintaining balance and with transfers this date. Sit to stand with min A to stedy, stedy transfer to [place] chair. Dependent transport via TIS to/from therapy gym. Offered to play pt's music of choice during therapy session due to depressed mood, pt declines. Squat pivot transfers w/c to/from mat table with mod A this date. Pt unable to follow cues for head/hips relationship during transfer with  significant R lateral lean in sitting and with transfer. Scooting R/L on mat table with min A with use of mirror for visual feedback for body positioning. Seated reaching task with RUE reaching outside BOS and across midline for targets with min A for sitting balance. Pt unable to maintain sitting balance without min to mod A sitting EOM. Squat pivot transfer back to TIS chair with max A x 2 due to pain/fatigue/anxiety? Pt taken back upstairs, nursing notified of change in function this date. Secure chat sent to medical team as well to update on change in function this date. BP 105/66 while semi-reclined in chair. Pt reports no further symptoms just pain and fatigue. Pt left semi-reclined in TIS chair in room with needs in reach, quick release belt and chair alarm in place for next therapy session.  Therapy Documentation Precautions:  Precautions Precautions: Fall Precaution Comments: H/o CVA with residual L hemiparesis; dysarthric bowel incontinence Required Braces or Orthoses:  (L resting hand spint at baseline (variable schedule)) Restrictions Weight Bearing Restrictions: No     Therapy/Group: Individual Therapy   Excell Seltzer, PT, DPT, CSRS  12/07/2020, 1:12 PM

## 2020-12-07 NOTE — Progress Notes (Signed)
Physical Therapy Wound Treatment Patient Details  Name: [name] MRN: [id number] Date of Birth: [birth year]  Today's Date: 12/07/2020 PT Individual Time: 8250-5397 PT Individual Time Calculation (min): 31 min   Subjective  Patient and Family Stated Goals: I'll be happy if you can help me get home. Date of Onset:  (unknow) Prior Treatments:  (unknown)  Pain Score: Pain Score: 6   Wound Assessment  Pressure Injury 11/23/20 Coccyx Lower;Medial;Left Unstageable - Full thickness tissue loss in which the base of the injury is covered by slough (yellow, tan, gray, green or brown) and/or eschar (tan, brown or black) in the wound bed. Yellow area of sloug (Active)  Wound Image   12/03/20 1705  Dressing Type Barrier Film (skin prep);Foam - Lift dressing to assess site every shift;Gauze (Comment);Moist to dry;Normal saline moist dressing;Santyl 12/07/20 1233  Dressing Changed;Intact 12/07/20 1233  Dressing Change Frequency Twice a day 12/07/20 1233  State of Healing Early/partial granulation 12/05/20 2216  Site / Wound Assessment Yellow;Red 12/07/20 1233  % Wound base Red or Granulating 60% 12/07/20 1233  % Wound base Yellow/Fibrinous Exudate 40% 12/07/20 1233  % Wound base Black/Eschar 0% 12/07/20 1233  % Wound base Other/Granulation Tissue (Comment) 0% 12/07/20 1233  Peri-wound Assessment Erythema (blanchable) 12/05/20 1800  Wound Length (cm) 5 cm 12/05/20 1800  Wound Width (cm) 3 cm 12/05/20 1800  Wound Depth (cm) 2 cm 12/05/20 1800  Wound Surface Area (cm^2) 15 cm^2 12/05/20 1800  Wound Volume (cm^3) 30 cm^3 12/05/20 1800  Margins Unattached edges (unapproximated) 12/07/20 1233  Drainage Amount Minimal 12/07/20 1233  Drainage Description Serous 12/07/20 1233  Treatment Cleansed;Debridement (Selective);Hydrotherapy (Pulse lavage);Packing (Saline gauze) 12/07/20 1233      Hydrotherapy Pulsed lavage therapy - wound location: medial coccyx to R buttock Pulsed Lavage with Suction  (psi): 8 psi Pulsed Lavage with Suction - Normal Saline Used: 1000 mL Pulsed Lavage Tip: Tip with splash shield Selective Debridement Selective Debridement - Location: coccyx to r buttock Selective Debridement - Tools Used: Forceps;Scalpel Selective Debridement - Tissue Removed: yellow eschar   Wound Assessment and Plan  Wound Therapy - Assess/Plan/Recommendations Wound Therapy - Clinical Statement: Wound bed is progressing nicely with hydrothearpy; increased granulation tissue noted this session. Minimal yellow slough debrided. Recommend decrease to 3x/wk frequency; discussed with WOC and MD.  pt will benefit from continued Pulsed Lavage to soften eschar for selective debridement and decrease the bioburden in the wound. Wound Therapy - Functional Problem List: functional immobility Factors Delaying/Impairing Wound Healing: Altered sensation;Infection - systemic/local;Immobility Hydrotherapy Plan: Debridement;Dressing change;Patient/family education;Pulsatile lavage with suction Wound Therapy - Frequency: 6X / week Wound Therapy - Current Recommendations: PT Wound Therapy - Follow Up Recommendations: dressing changes by RN  Wound Therapy Goals- Improve the function of patient's integumentary system by progressing the wound(s) through the phases of wound healing (inflammation - proliferation - remodeling) by: Decrease Necrotic Tissue to: 20 % Decrease Necrotic Tissue - Progress: Progressing toward goal Increase Granulation Tissue to: 80% Increase Granulation Tissue - Progress: Progressing toward goal Goals/treatment plan/discharge plan were made with and agreed upon by patient/family: Yes Time For Goal Achievement: 7 days Wound Therapy - Potential for Goals: Good  Goals will be updated until maximal potential achieved or discharge criteria met.  Discharge criteria: when goals achieved, discharge from hospital, MD decision/surgical intervention, no progress towards goals, refusal/missing  three consecutive treatments without notification or medical reason.  GP     Tessie Fass Elbie Statzer 12/07/2020, 12:38 PM 12/07/2020  Ginger Carne., PT Acute  Rehabilitation Services [phone]  (pager) [phone]  (office)

## 2020-12-07 NOTE — Progress Notes (Signed)
PROGRESS NOTE   Subjective/Complaints:  Pt reports initially irritated and angry that still having pain in butt from ulcer- explained it will take weeks for pain to get better-   When seen 2nd time, was told pt had anterior lean and needed a lot more help- per pt, doesn't feel weaker or like has had a stroke- said not sleeping well due to pain, so just real tired and leaning due ot pain in butt- Pain meds help, but has hydrotherapy today- already got Oxy 15 mg-scheduled and worried that it will still hurt- because it usually does.   Having constipation- drank miralax this AM- LBM this AM.  More sad/upset about butt.    ROS:    Pt denies SOB, abd pain, CP, N/V/C/D, and vision changes    Objective:   No results found. No results for input(s): WBC, HGB, HCT, PLT in the last 72 hours.   Recent Labs    12/06/20 0506 12/07/20 0554  NA 134* 135  K 5.1 5.0  CL 102 105  CO2 23 23  GLUCOSE 131* 133*  BUN 24* 21*  CREATININE 1.64* 1.50*  CALCIUM 8.9 8.6*    Intake/Output Summary (Last 24 hours) at 12/07/2020 1424 Last data filed at 12/07/2020 0830 Gross per 24 hour  Intake 1784.75 ml  Output 225 ml  Net 1559.75 ml     Pressure Injury 11/23/20 Coccyx Lower;Medial;Left Unstageable - Full thickness tissue loss in which the base of the injury is covered by slough (yellow, tan, gray, green or brown) and/or eschar (tan, brown or black) in the wound bed. Yellow area of sloug (Active)  11/23/20 1836  Location: Coccyx  Location Orientation: Lower;Medial;Left  Staging: Unstageable - Full thickness tissue loss in which the base of the injury is covered by slough (yellow, tan, gray, green or brown) and/or eschar (tan, brown or black) in the wound bed.  Wound Description (Comments): Yellow area of slough  Present on Admission: Yes     Pressure Injury 11/23/20 Coccyx Left;Lower;Medial Unstageable - Full thickness tissue loss in  which the base of the injury is covered by slough (yellow, tan, gray, green or brown) and/or eschar (tan, brown or black) in the wound bed. Thick yellow and bro (Active)  11/23/20 1836  Location: Coccyx  Location Orientation: Left;Lower;Medial  Staging: Unstageable - Full thickness tissue loss in which the base of the injury is covered by slough (yellow, tan, gray, green or brown) and/or eschar (tan, brown or black) in the wound bed.  Wound Description (Comments): Thick yellow and brown tissue. Unable to view healthy tissue.  Present on Admission: Yes    Physical Exam: Vital Signs Blood pressure 106/74, pulse 79, temperature 98.3 F (36.8 C), temperature source Oral, resp. rate 16, height 5' 2.99" (1.6 m), weight 119 kg, SpO2 97 %.     BP 106/74 when seen  General: awake, alert, appropriate, laying in L side in bed both times seen; with RN 2nd time; NAD HENT: conjugate gaze- EOMI B/L- no nystagmus seen; smile equal and tongue midline; oropharynx moist CV: regular rate; no JVD Pulmonary: CTA B/L; no W/R/R- good air movement GI: soft, NT, ND, (+)BS Psychiatric: appropriate Neurological:  Ox3  alert;  dysarthric; L hemiparesis with spasticity- has ~ 2-3/5 strength in LUE and 5-/5 in RUE- cannot test legs- refuses to lay flat  Skin: Warm and dry.  Wounds with dressing intact- sacral ulcer/coccyx- full thickness tissue loss- covered in slough- getting less thick - more granulation seen. Not assessed today Musc: No edema in extremities.  No tenderness in extremities. Dysarthria Motor: LUE 1+ to 2/5 prox to distal. MAS 3wrist/finger flexors and biceps.  LLE 3-/5 prox to 3/5 distally without  tone, stable   Assessment/Plan: 1. Functional deficits which require 3+ hours per day of interdisciplinary therapy in a comprehensive inpatient rehab setting. Physiatrist is providing close team supervision and 24 hour management of active medical problems listed below. Physiatrist and rehab team  continue to assess barriers to discharge/monitor patient progress toward functional and medical goals  Care Tool:  Bathing    Body parts bathed by patient: Left arm, Chest, Abdomen, Front perineal area, Left upper leg, Right upper leg, Face   Body parts bathed by helper: Buttocks, Right lower leg, Left lower leg     Bathing assist Assist Level: Moderate Assistance - Patient 50 - 74%     Upper Body Dressing/Undressing Upper body dressing   What is the patient wearing?: Pull over shirt    Upper body assist Assist Level: Supervision/Verbal cueing    Lower Body Dressing/Undressing Lower body dressing      What is the patient wearing?: Incontinence brief, Pants     Lower body assist Assist for lower body dressing: Moderate Assistance - Patient 50 - 74%     Toileting Toileting Toileting Activity did not occur (Clothing management and hygiene only): N/A (no void or bm)  Toileting assist Assist for toileting: Moderate Assistance - Patient 50 - 74%     Transfers Chair/bed transfer  Transfers assist     Chair/bed transfer assist level: 2 Helpers     Locomotion Ambulation   Ambulation assist   Ambulation activity did not occur: Safety/medical concerns          Walk 10 feet activity   Assist  Walk 10 feet activity did not occur: Safety/medical concerns        Walk 50 feet activity   Assist Walk 50 feet with 2 turns activity did not occur: Safety/medical concerns         Walk 150 feet activity   Assist Walk 150 feet activity did not occur: Safety/medical concerns         Walk 10 feet on uneven surface  activity   Assist Walk 10 feet on uneven surfaces activity did not occur: Safety/medical concerns         Wheelchair     Assist Is the patient using a wheelchair?: Yes Type of Wheelchair: Manual    Wheelchair assist level: Supervision/Verbal cueing Max wheelchair distance: 40    Wheelchair 50 feet with 2 turns  activity    Assist        Assist Level: Supervision/Verbal cueing   Wheelchair 150 feet activity     Assist      Assist Level: Maximal Assistance - Patient 25 - 49%   Blood pressure 106/74, pulse 79, temperature 98.3 F (36.8 C), temperature source Oral, resp. rate 16, height 5' 2.99" (1.6 m), weight 119 kg, SpO2 97 %.  Medical Problem List and Plan: 1.  Functional deficits secondary to debility related to new onset a fib and multiple medical  con't PT and OT/ CIR- d/c-11/29- moved d/c date  Con't PT and OT/CIR-  2.  Antithrombotics: -DVT/anticoagulation:  Mechanical: Sequential compression devices, below knee Bilateral lower extremities             -antiplatelet therapy: N/a 3. Pain Management/spasticity: tylenol # 3 prn for pain.              -pt followed by [facility], has had botox in past. Looks as if these are needed again to wrist and finger flexors  11/15- will have pt see Dr Letta Pate for Botox after d/c.   11/23- not taking pain meds- per nursing- advised pt to ask for them- to get pain controlled- also gave him an additional oxy daily for hydrotherapy.   11/24- pain doing better- con't regimen  11/25- made sure nursing gave extra 5 mg Oxy for hydrotherapy- explained to pt wound will take weeks to improve.  4. Mood: LCSW to follow for evaluation and support.              -antipsychotic agents: N/A 5. Neuropsych: This patient is capable of making decisions on his own behalf. 6. Skin/Wound Care: Routine pressure relief Mesures.              --added beneprotein.  7. Fluids/Electrolytes/Nutrition: strict I/Os             --Monitor renal status with serial checks.  8. A fib new onset: Monitor HR TID and for symptoms with increase in activity.             --continue Coreg, Cardizem and amiodarone             --Plans for repeat EGD in 8 weeks for clearance on addition of DOAC  Rate controlled 11/19 9. Gout flare: Continue Colchicine daily for now. Pt says it's  improving.  11/16- Sx's resolved as of 11/15- off Prednisone 10. Acute on chronic AKI?: Due to NSAIDS for gout flare. Has resolved with SCr -1.3? Baseline .             -- 1.4 11/12--near baseline---continue to follow  11/17- IVFs 75cc/hour x 24 hours due to renal issues  Creatinine 1.29 on 1/18, labs ordered for Monday  11/21- Cr 1.57 again today- it appears to be higher than baseline- will recheck Wednesday  11/23- Will recheck BMP in AM  11/24- Cr 1.64- will give IVFs again and recheck in AM-   11/25- Baseline Cr 1.3-1.4, however still at 1.50 after IVFs0 BUN 21- not drinking enough- again asked pt to drink more.  11. T2DM with hyperglycemia: Hgb A1c-6.7. Monitor BS ac/hs and use SSI for elevated BS.  CBG (last 3)  Recent Labs    12/06/20 2149 12/07/20 0630 12/07/20 1130  GLUCAP 148* 128* 134*    --Added CM restrictions. Continue to hold metformin 11/25- CBGs adequate off metformin 12. Chronic diastolic CHF: Monitor I/O. Check daily weights.  --Will monitor for signs of overload. On Lipitor.              --Ace on hold due to recent AKI.  13. Multiple gastric/colonic ulcers: On PPI BID.             --monitor for signs of bleeding.              Hemoglobin 10.1 on 11/14, labs ordered for Monday  Will con't to monitor  14.  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  Loose  stools likely colchicine, gout flare responding to prednisone , will d/ced colchicine   11/15- doing better-con't to monitor 16. Pressure ulcers on backside  11/15- has unstageable full thickness on coccyx - will con't local wound care- if doesn't improve, will call [facility]- seen by our WTA.   11/16- hydrotherapy ordered  11/18- added oxy for pain from wound  11/21- increased granulation and less slough- changed to 3x/week.   11/23- added Oxy prn for hydrotherapy specifically.   11/25- con't wound care 17. Hyperkalemia  11/24- K+ 5.1- will see if hemolysis-  recheck in AM  11/25- K+ 5.0- will recheck Monday 18. Insomnia  11/25- will add Trazodone 75 mg QHS for sleep.   I spent a total of 46 minutes on total care- >50% on coordination of care- speaking with nurse, PT and reassessing pt due to concern for decline in function- appears related to lack of sleep/pain, not new stroke.    LOS: 14 days A FACE TO FACE EVALUATION WAS PERFORMED  Cleaster Shiffer 12/07/2020, 2:24 PM

## 2020-12-07 NOTE — Progress Notes (Signed)
Physical Therapy Note  Patient Details  Name: [name] MRN: [id number] Date of Birth: April 02, 1965 Today's Date: 12/07/2020   Attempted to see patient for scheduled PM therapy session. Pt in L sidelying in bed with wife present, declines participation in session at this time due to fatigue and pain at site of buttocks wound. Per nursing pt premedicated prior to start of therapy session. Encouraged pt to even sit up to EOB due to upcoming d/c and importance of participation leading up to d/c next week. Pt declines. Pt also has not yet eaten lunch, reports he is not hungry and declines to eat at this time. Pt left in L sidelying in bed with needs in reach, wife present. Pt missed 45 min of scheduled therapy session due to refusal.    Excell Seltzer, PT, DPT, CSRS  12/07/2020, 2:17 PM

## 2020-12-07 NOTE — Progress Notes (Signed)
PT reported that pt had anterior lean and required increased assistance during transfers. Pt states leaning was due to sacral pain and he feels "just tired". Pain meds administered for comfort. Dr. Dagoberto Ligas in room later to assess pt. Pt in no acute distress at this time. Vital signs at baseline.   Gerald Stabs, RN

## 2020-12-07 NOTE — Progress Notes (Signed)
Patient ID: [name], male   DOB: 01-Dec-1965, 55 y.o.   MRN: [id number]   SW followed up with pt wife [name] [phone]) to inform on roho cushion delivery attempt/ SW informed next attempt will be made on Monday. Informed her [place] indicating to check voicemail on either her phone or husband's to get contact information for deliver department to  discuss delivery.  Loralee Pacas, MSW, [place] Office: [phone] Cell: [phone] Fax: [phone]

## 2020-12-08 LAB — GLUCOSE, CAPILLARY
Glucose-Capillary: 123 mg/dL — ABNORMAL HIGH (ref 70–99)
Glucose-Capillary: 130 mg/dL — ABNORMAL HIGH (ref 70–99)
Glucose-Capillary: 135 mg/dL — ABNORMAL HIGH (ref 70–99)
Glucose-Capillary: 137 mg/dL — ABNORMAL HIGH (ref 70–99)

## 2020-12-08 NOTE — Progress Notes (Signed)
PROGRESS NOTE   Subjective/Complaints: No complaints this morning  Labs improved yesterdat  ROS:    Pt denies SOB, abd pain, CP, N/V/C/D, and vision changes    Objective:   No results found. No results for input(s): WBC, HGB, HCT, PLT in the last 72 hours.   Recent Labs    12/06/20 0506 12/07/20 0554  NA 134* 135  K 5.1 5.0  CL 102 105  CO2 23 23  GLUCOSE 131* 133*  BUN 24* 21*  CREATININE 1.64* 1.50*  CALCIUM 8.9 8.6*    Intake/Output Summary (Last 24 hours) at 12/08/2020 1355 Last data filed at 12/08/2020 1259 Gross per 24 hour  Intake 960 ml  Output 355 ml  Net 605 ml     Pressure Injury 11/23/20 Coccyx Lower;Medial;Left Unstageable - Full thickness tissue loss in which the base of the injury is covered by slough (yellow, tan, gray, green or brown) and/or eschar (tan, brown or black) in the wound bed. Yellow area of sloug (Active)  11/23/20 1836  Location: Coccyx  Location Orientation: Lower;Medial;Left  Staging: Unstageable - Full thickness tissue loss in which the base of the injury is covered by slough (yellow, tan, gray, green or brown) and/or eschar (tan, brown or black) in the wound bed.  Wound Description (Comments): Yellow area of slough  Present on Admission: Yes     Pressure Injury 11/23/20 Coccyx Left;Lower;Medial Unstageable - Full thickness tissue loss in which the base of the injury is covered by slough (yellow, tan, gray, green or brown) and/or eschar (tan, brown or black) in the wound bed. Thick yellow and bro (Active)  11/23/20 1836  Location: Coccyx  Location Orientation: Left;Lower;Medial  Staging: Unstageable - Full thickness tissue loss in which the base of the injury is covered by slough (yellow, tan, gray, green or brown) and/or eschar (tan, brown or black) in the wound bed.  Wound Description (Comments): Thick yellow and brown tissue. Unable to view healthy tissue.  Present on  Admission: Yes    Physical Exam: Vital Signs Blood pressure 104/61, pulse 74, temperature 98.3 F (36.8 C), temperature source Oral, resp. rate 18, height 5' 2.99" (1.6 m), weight 120 kg, SpO2 93 %.  Gen: no distress, normal appearing HEENT: oral mucosa pink and moist, NCAT Cardio: Reg rate Chest: normal effort, normal rate of breathing Abd: soft, non-distended Ext: no edema Psych: pleasant, normal affect Skin: intact Neurological: Ox3  alert;  dysarthric; L hemiparesis with spasticity- has ~ 2-3/5 strength in LUE and 5-/5 in RUE- cannot test legs- refuses to lay flat  Skin: Warm and dry.  Wounds with dressing intact- sacral ulcer/coccyx- full thickness tissue loss- covered in slough- getting less thick - more granulation seen. Not assessed today Musc: No edema in extremities.  No tenderness in extremities. Dysarthria Motor: LUE 1+ to 2/5 prox to distal. MAS 3wrist/finger flexors and biceps.  LLE 3-/5 prox to 3/5 distally without  tone, stable   Assessment/Plan: 1. Functional deficits which require 3+ hours per day of interdisciplinary therapy in a comprehensive inpatient rehab setting. Physiatrist is providing close team supervision and 24 hour management of active medical problems listed below. Physiatrist and rehab team continue  to assess barriers to discharge/monitor patient progress toward functional and medical goals  Care Tool:  Bathing    Body parts bathed by patient: Left arm, Chest, Abdomen, Front perineal area, Left upper leg, Right upper leg, Face   Body parts bathed by helper: Buttocks, Right lower leg, Left lower leg     Bathing assist Assist Level: Moderate Assistance - Patient 50 - 74%     Upper Body Dressing/Undressing Upper body dressing   What is the patient wearing?: Pull over shirt    Upper body assist Assist Level: Supervision/Verbal cueing    Lower Body Dressing/Undressing Lower body dressing      What is the patient wearing?: Incontinence  brief, Pants     Lower body assist Assist for lower body dressing: Moderate Assistance - Patient 50 - 74%     Toileting Toileting Toileting Activity did not occur (Clothing management and hygiene only): N/A (no void or bm)  Toileting assist Assist for toileting: Moderate Assistance - Patient 50 - 74%     Transfers Chair/bed transfer  Transfers assist     Chair/bed transfer assist level: 2 Helpers     Locomotion Ambulation   Ambulation assist   Ambulation activity did not occur: Safety/medical concerns          Walk 10 feet activity   Assist  Walk 10 feet activity did not occur: Safety/medical concerns        Walk 50 feet activity   Assist Walk 50 feet with 2 turns activity did not occur: Safety/medical concerns         Walk 150 feet activity   Assist Walk 150 feet activity did not occur: Safety/medical concerns         Walk 10 feet on uneven surface  activity   Assist Walk 10 feet on uneven surfaces activity did not occur: Safety/medical concerns         Wheelchair     Assist Is the patient using a wheelchair?: Yes Type of Wheelchair: Manual    Wheelchair assist level: Supervision/Verbal cueing Max wheelchair distance: 40    Wheelchair 50 feet with 2 turns activity    Assist        Assist Level: Supervision/Verbal cueing   Wheelchair 150 feet activity     Assist      Assist Level: Maximal Assistance - Patient 25 - 49%   Blood pressure 104/61, pulse 74, temperature 98.3 F (36.8 C), temperature source Oral, resp. rate 18, height 5' 2.99" (1.6 m), weight 120 kg, SpO2 93 %.  Medical Problem List and Plan: 1.  Functional deficits secondary to debility related to new onset a fib and multiple medical            Continue PT and OT/ CIR- d/c-11/29- moved d/c date 2.  Antithrombotics: -DVT/anticoagulation:  Mechanical: Sequential compression devices, below knee Bilateral lower extremities             -antiplatelet  therapy: N/a 3. Pain/spasticity: continue tylenol # 3 prn for pain.              -pt followed by [facility], has had botox in past. Looks as if these are needed again to wrist and finger flexors  11/15- will have pt see Dr Letta Pate for Botox after d/c.   11/23- not taking pain meds- per nursing- advised pt to ask for them- to get pain controlled- also gave him an additional oxy daily for hydrotherapy.   11/24- pain doing better- con't regimen  11/25- made sure nursing gave extra 5 mg Oxy for hydrotherapy- explained to pt wound will take weeks to improve.  4. Mood: LCSW to follow for evaluation and support.              -antipsychotic agents: N/A 5. Neuropsych: This patient is capable of making decisions on his own behalf. 6. Skin/Wound Care: Routine pressure relief Mesures.              --added beneprotein.  7. Fluids/Electrolytes/Nutrition: strict I/Os             --Monitor renal status with serial checks.  8. A fib new onset: Monitor HR TID and for symptoms with increase in activity.             --continue Coreg, Cardizem and amiodarone             --Plans for repeat EGD in 8 weeks for clearance on addition of DOAC  Rate controlled 11/19 9. Gout flare: Continue Colchicine daily for now. Pt says it's improving.  11/16- Sx's resolved as of 11/15- off Prednisone 10. Acute on chronic AKI?: Due to NSAIDS for gout flare. Has resolved with SCr -1.3? Baseline .             -- 1.4 11/12--near baseline---continue to follow  11/17- IVFs 75cc/hour x 24 hours due to renal issues  Creatinine 1.29 on 1/18, labs ordered for Monday  11/21- Cr 1.57 again today- it appears to be higher than baseline- will recheck Wednesday  11/23- Will recheck BMP in AM  11/24- Cr 1.64- will give IVFs again and recheck in AM-   11/25- Baseline Cr 1.3-1.4, however still at 1.50 after IVFs0 BUN 21- not drinking enough- again asked pt to drink more.  11. T2DM with hyperglycemia: Hgb A1c-6.7. Monitor BS ac/hs and use SSI for  elevated BS.  CBG (last 3)  Recent Labs    12/07/20 1622 12/08/20 0602 12/08/20 1121  GLUCAP 166* 123* 130*    --Added CM restrictions. Continue to hold metformin 11/25- CBGs adequate off metformin 12. Chronic diastolic CHF: Monitor I/O. Check daily weights.  --Will monitor for signs of overload. On Lipitor.              --Ace on hold due to recent AKI.  13. Multiple gastric/colonic ulcers: On PPI BID.             --monitor for signs of bleeding.              Hemoglobin 10.1 on 11/14, labs ordered for Monday  Will con't to monitor  14.  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  Loose stools likely colchicine, gout flare responding to prednisone , will d/ced colchicine   11/15- doing better-con't to monitor 16. Pressure ulcers on backside  11/15- has unstageable full thickness on coccyx - will con't local wound care- if doesn't improve, will call [facility]- seen by our WTA.   11/16- hydrotherapy ordered  11/18- added oxy for pain from wound  11/21- increased granulation and less slough- changed to 3x/week.   11/23- added Oxy prn for hydrotherapy specifically.   11/25- con't wound care 17. Hyperkalemia  11/24- K+ 5.1- will see if hemolysis- recheck in AM  11/25- K+ 5.0- will recheck Monday 18. Insomnia  Continue Trazodone 75 mg QHS for sleep.      LOS: 15 days A FACE TO FACE EVALUATION WAS PERFORMED  Clide Deutscher Kyi Romanello 12/08/2020,  1:55 PM

## 2020-12-09 LAB — GLUCOSE, CAPILLARY
Glucose-Capillary: 132 mg/dL — ABNORMAL HIGH (ref 70–99)
Glucose-Capillary: 113 mg/dL — ABNORMAL HIGH (ref 70–99)
Glucose-Capillary: 141 mg/dL — ABNORMAL HIGH (ref 70–99)
Glucose-Capillary: 125 mg/dL — ABNORMAL HIGH (ref 70–99)

## 2020-12-09 NOTE — Progress Notes (Signed)
PROGRESS NOTE   Subjective/Complaints: No complaints this morning Patient's chart reviewed- No issues reported overnight Vitals signs stable  Respectful and polite  ROS:    Pt denies SOB, abd pain, CP, N/V/C/D, and vision changes    Objective:   No results found. No results for input(s): WBC, HGB, HCT, PLT in the last 72 hours.   Recent Labs    12/07/20 0554  NA 135  K 5.0  CL 105  CO2 23  GLUCOSE 133*  BUN 21*  CREATININE 1.50*  CALCIUM 8.6*    Intake/Output Summary (Last 24 hours) at 12/09/2020 1318 Last data filed at 12/09/2020 1224 Gross per 24 hour  Intake 120 ml  Output 750 ml  Net -630 ml     Pressure Injury 11/23/20 Coccyx Lower;Medial;Left Unstageable - Full thickness tissue loss in which the base of the injury is covered by slough (yellow, tan, gray, green or brown) and/or eschar (tan, brown or black) in the wound bed. Yellow area of sloug (Active)  11/23/20 1836  Location: Coccyx  Location Orientation: Lower;Medial;Left  Staging: Unstageable - Full thickness tissue loss in which the base of the injury is covered by slough (yellow, tan, gray, green or brown) and/or eschar (tan, brown or black) in the wound bed.  Wound Description (Comments): Yellow area of slough  Present on Admission: Yes     Pressure Injury 11/23/20 Coccyx Left;Lower;Medial Unstageable - Full thickness tissue loss in which the base of the injury is covered by slough (yellow, tan, gray, green or brown) and/or eschar (tan, brown or black) in the wound bed. Thick yellow and bro (Active)  11/23/20 1836  Location: Coccyx  Location Orientation: Left;Lower;Medial  Staging: Unstageable - Full thickness tissue loss in which the base of the injury is covered by slough (yellow, tan, gray, green or brown) and/or eschar (tan, brown or black) in the wound bed.  Wound Description (Comments): Thick yellow and brown tissue. Unable to view  healthy tissue.  Present on Admission: Yes    Physical Exam: Vital Signs Blood pressure 105/61, pulse 78, temperature 99.2 F (37.3 C), resp. rate 18, height 5' 2.99" (1.6 m), weight 120 kg, SpO2 98 %.  Gen: no distress, normal appearing HEENT: oral mucosa pink and moist, NCAT Cardio: Reg rate Chest: normal effort, normal rate of breathing Abd: soft, non-distended Ext: no edema Psych: pleasant, normal affect, respectful, polite Skin: intact Neurological: Ox3  alert;  dysarthric; L hemiparesis with spasticity- has ~ 2-3/5 strength in LUE and 5-/5 in RUE- cannot test legs- refuses to lay flat  Skin: Warm and dry.  Wounds with dressing intact- sacral ulcer/coccyx- full thickness tissue loss- covered in slough- getting less thick - more granulation seen. Not assessed today Musc: No edema in extremities.  No tenderness in extremities. Dysarthria Motor: LUE 1+ to 2/5 prox to distal. MAS 3wrist/finger flexors and biceps.  LLE 3-/5 prox to 3/5 distally without  tone, stable   Assessment/Plan: 1. Functional deficits which require 3+ hours per day of interdisciplinary therapy in a comprehensive inpatient rehab setting. Physiatrist is providing close team supervision and 24 hour management of active medical problems listed below. Physiatrist and rehab team continue to  assess barriers to discharge/monitor patient progress toward functional and medical goals  Care Tool:  Bathing    Body parts bathed by patient: Left arm, Chest, Abdomen, Front perineal area, Left upper leg, Right upper leg, Face   Body parts bathed by helper: Buttocks, Right lower leg, Left lower leg     Bathing assist Assist Level: Moderate Assistance - Patient 50 - 74%     Upper Body Dressing/Undressing Upper body dressing   What is the patient wearing?: Pull over shirt    Upper body assist Assist Level: Supervision/Verbal cueing    Lower Body Dressing/Undressing Lower body dressing      What is the patient  wearing?: Incontinence brief, Pants     Lower body assist Assist for lower body dressing: Moderate Assistance - Patient 50 - 74%     Toileting Toileting Toileting Activity did not occur (Clothing management and hygiene only): N/A (no void or bm)  Toileting assist Assist for toileting: Moderate Assistance - Patient 50 - 74%     Transfers Chair/bed transfer  Transfers assist     Chair/bed transfer assist level: 2 Helpers     Locomotion Ambulation   Ambulation assist   Ambulation activity did not occur: Safety/medical concerns          Walk 10 feet activity   Assist  Walk 10 feet activity did not occur: Safety/medical concerns        Walk 50 feet activity   Assist Walk 50 feet with 2 turns activity did not occur: Safety/medical concerns         Walk 150 feet activity   Assist Walk 150 feet activity did not occur: Safety/medical concerns         Walk 10 feet on uneven surface  activity   Assist Walk 10 feet on uneven surfaces activity did not occur: Safety/medical concerns         Wheelchair     Assist Is the patient using a wheelchair?: Yes Type of Wheelchair: Manual    Wheelchair assist level: Supervision/Verbal cueing Max wheelchair distance: 40    Wheelchair 50 feet with 2 turns activity    Assist        Assist Level: Supervision/Verbal cueing   Wheelchair 150 feet activity     Assist      Assist Level: Maximal Assistance - Patient 25 - 49%   Blood pressure 105/61, pulse 78, temperature 99.2 F (37.3 C), resp. rate 18, height 5' 2.99" (1.6 m), weight 120 kg, SpO2 98 %.  Medical Problem List and Plan: 1.  Functional deficits secondary to debility related to new onset a fib and multiple medical            Continue PT and OT/ CIR- d/c-11/29- moved d/c date 2.  Antithrombotics: -DVT/anticoagulation:  Mechanical: Sequential compression devices, below knee Bilateral lower extremities             -antiplatelet  therapy: N/a 3. Pain/spasticity: continue tylenol # 3 prn for pain.              -pt followed by [facility], has had botox in past. Looks as if these are needed again to wrist and finger flexors  11/15- will have pt see Dr Letta Pate for Botox after d/c.   11/23- not taking pain meds- per nursing- advised pt to ask for them- to get pain controlled- also gave him an additional oxy daily for hydrotherapy.   11/24- pain doing better- con't regimen  11/25- made sure nursing  gave extra 5 mg Oxy for hydrotherapy- explained to pt wound will take weeks to improve.  4. Mood: LCSW to follow for evaluation and support.              -antipsychotic agents: N/A 5. Neuropsych: This patient is capable of making decisions on his own behalf. 6. Skin/Wound Care: Routine pressure relief Mesures.              --added beneprotein.  7. Fluids/Electrolytes/Nutrition: strict I/Os             --Monitor renal status with serial checks.  8. A fib new onset: Monitor HR TID and for symptoms with increase in activity.             --continue Coreg, Cardizem and amiodarone             --Plans for repeat EGD in 8 weeks for clearance on addition of DOAC  Rate controlled 11/19 9. Gout flare: Continue Colchicine daily for now. Pt says it's improving.  11/16- Sx's resolved as of 11/15- off Prednisone 10. Acute on chronic AKI?: Due to NSAIDS for gout flare. Has resolved with SCr -1.3? Baseline .             -- 1.4 11/12--near baseline---continue to follow  11/17- IVFs 75cc/hour x 24 hours due to renal issues  Creatinine 1.29 on 1/18, labs ordered for Monday  11/21- Cr 1.57 again today- it appears to be higher than baseline- will recheck Wednesday  11/23- Will recheck BMP in AM  11/24- Cr 1.64- will give IVFs again and recheck in AM-   11/25- Baseline Cr 1.3-1.4, however still at 1.50 after IVFs0 BUN 21- not drinking enough- again asked pt to drink more.  11. T2DM with hyperglycemia: Hgb A1c-6.7. Monitor BS ac/hs and use SSI for  elevated BS.  CBG (last 3)  Recent Labs    12/08/20 2011 12/09/20 0624 12/09/20 1225  GLUCAP 137* 132* 113*    --Added CM restrictions. Continue to hold metformin 11/25- CBGs adequate off metformin 12. Chronic diastolic CHF: Monitor I/O. Check daily weights.  --Will monitor for signs of overload. On Lipitor.              --Ace on hold due to recent AKI.  13. Multiple gastric/colonic ulcers: On PPI BID.             --monitor for signs of bleeding.              Hemoglobin 10.1 on 11/14, labs ordered for Monday  Will con't to monitor  14.  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  Loose stools likely colchicine, gout flare responding to prednisone , will d/ced colchicine   11/15- doing better-con't to monitor 16. Pressure ulcers on backside  11/15- has unstageable full thickness on coccyx - will con't local wound care- if doesn't improve, will call [facility]OC- seen by our WTA.   11/16- hydrotherapy ordered  11/18- added oxy for pain from wound  11/21- increased granulation and less slough- changed to 3x/week.   11/23- added Oxy prn for hydrotherapy specifically.   11/25- con't wound care 17. Hyperkalemia  11/24- K+ 5.1- will see if hemolysis- recheck in AM  11/25- K+ 5.0- will recheck Monday 18. Insomnia  Continue Trazodone 75 mg QHS for sleep.  11. Morbid obesity BMI 46.88: provide dietary education     LOS: 16 days A FACE TO FACE EVALUATION WAS PERFORMED  Martha Clan P Rubbie Goostree 12/09/2020, 1:18 PM

## 2020-12-10 ENCOUNTER — Inpatient Hospital Stay (HOSPITAL_COMMUNITY): Payer: BC Managed Care – PPO

## 2020-12-10 LAB — CBC
HCT: 29.5 % — ABNORMAL LOW (ref 39.0–52.0)
Hemoglobin: 9.4 g/dL — ABNORMAL LOW (ref 13.0–17.0)
MCH: 25.1 pg — ABNORMAL LOW (ref 26.0–34.0)
MCHC: 31.9 g/dL (ref 30.0–36.0)
MCV: 78.7 fL — ABNORMAL LOW (ref 80.0–100.0)
Platelets: 198 10*3/uL (ref 150–400)
RBC: 3.75 MIL/uL — ABNORMAL LOW (ref 4.22–5.81)
RDW: 16 % — ABNORMAL HIGH (ref 11.5–15.5)
WBC: 4.9 10*3/uL (ref 4.0–10.5)
nRBC: 0 % (ref 0.0–0.2)

## 2020-12-10 LAB — GLUCOSE, CAPILLARY
Glucose-Capillary: 121 mg/dL — ABNORMAL HIGH (ref 70–99)
Glucose-Capillary: 139 mg/dL — ABNORMAL HIGH (ref 70–99)
Glucose-Capillary: 94 mg/dL (ref 70–99)
Glucose-Capillary: 95 mg/dL (ref 70–99)

## 2020-12-10 LAB — BASIC METABOLIC PANEL
Anion gap: 7 (ref 5–15)
BUN: 17 mg/dL (ref 6–20)
CO2: 27 mmol/L (ref 22–32)
Calcium: 8.7 mg/dL — ABNORMAL LOW (ref 8.9–10.3)
Chloride: 100 mmol/L (ref 98–111)
Creatinine, Ser: 1.4 mg/dL — ABNORMAL HIGH (ref 0.61–1.24)
GFR, Estimated: 60 mL/min — ABNORMAL LOW (ref >60)
Glucose, Bld: 125 mg/dL — ABNORMAL HIGH (ref 70–99)
Potassium: 4.9 mmol/L (ref 3.5–5.1)
Sodium: 134 mmol/L — ABNORMAL LOW (ref 135–145)

## 2020-12-10 NOTE — Progress Notes (Signed)
Occupational Therapy Weekly Progress Note  Patient Details  Name: [name] MRN: [id number] Date of Birth: [birth year]  Beginning of progress report period: November 29, 2020 End of progress report period: December 10, 2020  Today's Date: 12/10/2020 OT Individual Time: 5009-3818 OT Individual Time Calculation (min): 60 min    Patient has met 0 of 3 short term goals.  Pt is having much more difficulty with his functional mobility and ADLs due to pain from his pressure wound on bottom. Per his wife in family ed today, she said he was very independent at home before the fall.  Today he was requiring significant assist and not able to rise to stand sufficiently to release 1 hand to manage clothing.  He also only has a standard wc at home.  He is getting a roho cushion, but his wife said the standard chair is the only chair available to him to sit in.  With his pressure wound pt may benefit from a tilt in space.   Today was family ed scheduled for pt to go home tomorrow. But based on his performance today and the last few days, pt will not meet any of his goals.  Spoke with PT and she relayed concerns to the team.  Team agreed to extend him to 12/14/20.  Patient continues to demonstrate the following deficits: muscle weakness and muscle joint tightness, abnormal tone, unbalanced muscle activation, and decreased coordination, and decreased sitting balance, decreased standing balance, decreased postural control, hemiplegia, and decreased balance strategies and therefore will continue to benefit from skilled OT intervention to enhance overall performance with BADL and Reduce care partner burden.  Patient not progressing toward long term goals.  See goal revision..  Plan of care revisions: .Marland Kitchen Problem: RH Balance Goal: LTG Patient will maintain dynamic standing with ADLs (OT) Description: LTG:  Patient will maintain dynamic standing balance with assist during activities of daily living (OT)   Flowsheets (Taken 12/10/2020 1302) LTG: Pt will maintain dynamic standing balance during ADLs with: (LTG downgraded from independent as pt will not be safe to stand dynamically without support.) Minimal Assistance - Patient > 75% Note: LTG downgraded from independent as pt will not be safe to stand dynamically without support.   Problem: Sit to Stand Goal: LTG:  Patient will perform sit to stand in prep for activites of daily living with assistance level (OT) Description: LTG:  Patient will perform sit to stand in prep for activites of daily living with assistance level (OT) Flowsheets (Taken 12/10/2020 1302) LTG: PT will perform sit to stand in prep for activites of daily living with assistance level: (LTG downgraded due to pt's slow progress.) Contact Guard/Touching assist Note: LTG downgraded due to pt's slow progress.     OT Short Term Goals Week 1:  OT Short Term Goal 1 (Week 1): Patient will don UB clothing with set-up assist. OT Short Term Goal 1 - Progress (Week 1): Met OT Short Term Goal 2 (Week 1): Patient will complete 2/3 parts of toileting task with Min A and lateral leans. OT Short Term Goal 2 - Progress (Week 1): Progressing toward goal OT Short Term Goal 3 (Week 1): Patient will complete squat-pivot transfers with Min A. OT Short Term Goal 3 - Progress (Week 1): Progressing toward goal Week 2:  OT Short Term Goal 1 (Week 2): Patient will complete 2/3 parts of toileting task with Min A and lateral leans. OT Short Term Goal 1 - Progress (Week 2): Not met OT Short  Term Goal 2 (Week 2): Patient will complete squat-pivot transfers with Min A. OT Short Term Goal 2 - Progress (Week 2): Not met OT Short Term Goal 3 (Week 2): Pt will compete LB dressing with mod A sit<>stand/lateral leans OT Short Term Goal 3 - Progress (Week 2): Not met Week 3:  OT Short Term Goal 1 (Week 3): STGs = LTGs  Skilled Therapeutic Interventions/Progress Updates:    Pt received in room with NT helping  pt transfer from [facility] to [facility] with use of Stedy lift. From EOB, pt greeted and I explained this session was for family ed and his grad day as his planned dc is tomorrow. His wife arrived shortly after.   It was very obvious to me during the session that this pt is not prepared for d/c tomorrow as he is requiring much more physical A due to pain/weakness. His transfers, sit to stands were all at least mod A and difficult to have pt tolerate standing. His wife will have difficulty at home, she stated he was much more independent.  Please see notes above and ADL documentation below.   Pt resting in wc with spouse in room with pt at end of the session.   Therapy Documentation Precautions:  Precautions Precautions: Fall Precaution Comments: H/o CVA with residual L hemiparesis; dysarthric bowel incontinence Required Braces or Orthoses:  (L resting hand spint at baseline (variable schedule)) Restrictions Weight Bearing Restrictions: No      Pain:  8/10 in buttocks - premedicated   ADL: ADL Eating: Set up Where Assessed-Eating: Chair Grooming: Setup Upper Body Bathing: Minimal assistance Where Assessed-Upper Body Bathing: Chair Lower Body Bathing: Moderate assistance Where Assessed-Lower Body Bathing: Other (Comment) (Stedy) Upper Body Dressing: Moderate assistance Where Assessed-Upper Body Dressing: Chair Lower Body Dressing: Maximal assistance Where Assessed-Lower Body Dressing: Chair Toileting: Maximal assistance Toilet Transfer: Moderate assistance Toilet Transfer Method: Squat pivot Tub/Shower Transfer: Not assessed   Therapy/Group: Individual Therapy  [place] 12/10/2020, 1:05 PM

## 2020-12-10 NOTE — Progress Notes (Signed)
PROGRESS NOTE   Subjective/Complaints:  Pt reports he's great this AM- "pain doing better" at rest- LBM this AM.  Ate >50% of tray.   Per PT, he's doing NO better- has anterior lean which is unsafe and is max-total A with transfers- due to pain.   Will d/w pt about starting long acting pain meds, since pain is biggest limiting factor.    ROS:    Pt denies SOB, abd pain, CP, N/V/C/D, and vision changes (+) pain in buttocks    Objective:   No results found. Recent Labs    12/10/20 0648  WBC 4.9  HGB 9.4*  HCT 29.5*  PLT 198     Recent Labs    12/10/20 0648  NA 134*  K 4.9  CL 100  CO2 27  GLUCOSE 125*  BUN 17  CREATININE 1.40*  CALCIUM 8.7*    Intake/Output Summary (Last 24 hours) at 12/10/2020 1018 Last data filed at 12/09/2020 1318 Gross per 24 hour  Intake 240 ml  Output --  Net 240 ml     Pressure Injury 11/23/20 Coccyx Lower;Medial;Left Unstageable - Full thickness tissue loss in which the base of the injury is covered by slough (yellow, tan, gray, green or brown) and/or eschar (tan, brown or black) in the wound bed. Yellow area of sloug (Active)  11/23/20 1836  Location: Coccyx  Location Orientation: Lower;Medial;Left  Staging: Unstageable - Full thickness tissue loss in which the base of the injury is covered by slough (yellow, tan, gray, green or brown) and/or eschar (tan, brown or black) in the wound bed.  Wound Description (Comments): Yellow area of slough  Present on Admission: Yes     Pressure Injury 11/23/20 Coccyx Left;Lower;Medial Unstageable - Full thickness tissue loss in which the base of the injury is covered by slough (yellow, tan, gray, green or brown) and/or eschar (tan, brown or black) in the wound bed. Thick yellow and bro (Active)  11/23/20 1836  Location: Coccyx  Location Orientation: Left;Lower;Medial  Staging: Unstageable - Full thickness tissue loss in which the base  of the injury is covered by slough (yellow, tan, gray, green or brown) and/or eschar (tan, brown or black) in the wound bed.  Wound Description (Comments): Thick yellow and brown tissue. Unable to view healthy tissue.  Present on Admission: Yes    Physical Exam: Vital Signs Blood pressure 92/66, pulse 70, temperature 99.2 F (37.3 C), resp. rate 17, height 5' 2.99" (1.6 m), weight 118 kg, SpO2 97 %.    General: awake, alert, appropriate, laying on L side in bed; tray >50% tray eaten; NAD HENT: conjugate gaze; oropharynx moist CV: regular rate; no JVD Pulmonary: CTA B/L; no W/R/R- good air movement GI: soft, NT, ND, (+)BS Psychiatric: appropriate; interactive; brighter affect Neurological: Ox3   alert;  dysarthric; L hemiparesis with spasticity- has ~ 2-3/5 strength in LUE and 5-/5 in RUE- cannot test legs- refuses to lay flat  Skin: Warm and dry.  Wounds with dressing intact- sacral ulcer/coccyx- full thickness tissue loss- covered in slough- getting less thick - more granulation seen. Not assessed today Musc: No edema in extremities.  No tenderness in extremities. Dysarthria Motor: LUE  1+ to 2/5 prox to distal. MAS 3wrist/finger flexors and biceps.  LLE 3-/5 prox to 3/5 distally without  tone, stable   Assessment/Plan: 1. Functional deficits which require 3+ hours per day of interdisciplinary therapy in a comprehensive inpatient rehab setting. Physiatrist is providing close team supervision and 24 hour management of active medical problems listed below. Physiatrist and rehab team continue to assess barriers to discharge/monitor patient progress toward functional and medical goals  Care Tool:  Bathing    Body parts bathed by patient: Left arm, Chest, Abdomen, Front perineal area, Left upper leg, Right upper leg, Face   Body parts bathed by helper: Buttocks, Right lower leg, Left lower leg     Bathing assist Assist Level: Moderate Assistance - Patient 50 - 74%     Upper  Body Dressing/Undressing Upper body dressing   What is the patient wearing?: Pull over shirt    Upper body assist Assist Level: Supervision/Verbal cueing    Lower Body Dressing/Undressing Lower body dressing      What is the patient wearing?: Incontinence brief, Pants     Lower body assist Assist for lower body dressing: Moderate Assistance - Patient 50 - 74%     Toileting Toileting Toileting Activity did not occur (Clothing management and hygiene only): N/A (no void or bm)  Toileting assist Assist for toileting: Moderate Assistance - Patient 50 - 74%     Transfers Chair/bed transfer  Transfers assist     Chair/bed transfer assist level: 2 Helpers     Locomotion Ambulation   Ambulation assist   Ambulation activity did not occur: Safety/medical concerns          Walk 10 feet activity   Assist  Walk 10 feet activity did not occur: Safety/medical concerns        Walk 50 feet activity   Assist Walk 50 feet with 2 turns activity did not occur: Safety/medical concerns         Walk 150 feet activity   Assist Walk 150 feet activity did not occur: Safety/medical concerns         Walk 10 feet on uneven surface  activity   Assist Walk 10 feet on uneven surfaces activity did not occur: Safety/medical concerns         Wheelchair     Assist Is the patient using a wheelchair?: Yes Type of Wheelchair: Manual    Wheelchair assist level: Supervision/Verbal cueing Max wheelchair distance: 40    Wheelchair 50 feet with 2 turns activity    Assist        Assist Level: Supervision/Verbal cueing   Wheelchair 150 feet activity     Assist      Assist Level: Maximal Assistance - Patient 25 - 49%   Blood pressure 92/66, pulse 70, temperature 99.2 F (37.3 C), resp. rate 17, height 5' 2.99" (1.6 m), weight 118 kg, SpO2 97 %.  Medical Problem List and Plan: 1.  Functional deficits secondary to debility related to new onset a  fib and multiple medical           Con't CIR- wife here for family training- pt max-total A due to wound on backside- will see if can move his d/c date out somewhat and increase pain meds.  2.  Antithrombotics: -DVT/anticoagulation:  Mechanical: Sequential compression devices, below knee Bilateral lower extremities             -antiplatelet therapy: N/a 3. Pain/spasticity: continue tylenol # 3 prn for pain.              -  pt followed by [facility], has had botox in past. Looks as if these are needed again to wrist and finger flexors  11/15- will have pt see Dr Letta Pate for Botox after d/c.   11/23- not taking pain meds- per nursing- advised pt to ask for them- to get pain controlled- also gave him an additional oxy daily for hydrotherapy.   11/24- pain doing better- con't regimen  11/25- made sure nursing gave extra 5 mg Oxy for hydrotherapy- explained to pt wound will take weeks to improve. 11/28- will d/w pt starting MS Contin 15 mg BID and see if that helps his pain more- doesn't ask for meds as much as could.   4. Mood: LCSW to follow for evaluation and support.              -antipsychotic agents: N/A 5. Neuropsych: This patient is capable of making decisions on his own behalf. 6. Skin/Wound Care: Routine pressure relief Mesures.              --added beneprotein.  7. Fluids/Electrolytes/Nutrition: strict I/Os             --Monitor renal status with serial checks.  8. A fib new onset: Monitor HR TID and for symptoms with increase in activity.             --continue Coreg, Cardizem and amiodarone             --Plans for repeat EGD in 8 weeks for clearance on addition of DOAC  Rate controlled 11/19 9. Gout flare: Continue Colchicine daily for now. Pt says it's improving.  11/16- Sx's resolved as of 11/15- off Prednisone 10. Acute on chronic AKI?: Due to NSAIDS for gout flare. Has resolved with SCr -1.3? Baseline .             -- 1.4 11/12--near baseline---continue to follow  11/17- IVFs  75cc/hour x 24 hours due to renal issues  Creatinine 1.29 on 1/18, labs ordered for Monday  11/21- Cr 1.57 again today- it appears to be higher than baseline- will recheck Wednesday  11/23- Will recheck BMP in AM  11/24- Cr 1.64- will give IVFs again and recheck in AM-   11/25- Baseline Cr 1.3-1.4, however still at 1.50 after IVFs0 BUN 21- not drinking enough- again asked pt to drink more.   11/28- Cr down to 1.40 and BUN 17- will con't off IVFs.  11. T2DM with hyperglycemia: Hgb A1c-6.7. Monitor BS ac/hs and use SSI for elevated BS.  CBG (last 3)  Recent Labs    12/09/20 1616 12/09/20 2054 12/10/20 0617  GLUCAP 141* 125* 121*    --Added CM restrictions. Continue to hold metformin 11/28- CBGs look OK- con't regimen 12. Chronic diastolic CHF: Monitor I/O. Check daily weights.  --Will monitor for signs of overload. On Lipitor.              --Ace on hold due to recent AKI.  13. Multiple gastric/colonic ulcers: On PPI BID.             --monitor for signs of bleeding.              Hemoglobin 10.1 on 11/14, labs ordered for Monday  Will con't to monitor  14.  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  Loose stools likely colchicine, gout flare responding to prednisone , will d/ced colchicine   11/15- doing better-con't to monitor 16. Pressure ulcers on backside  11/15- has unstageable full thickness on coccyx - will con't local wound care- if doesn't improve, will call [facility]OC- seen by our WTA.   11/16- hydrotherapy ordered  11/18- added oxy for pain from wound  11/21- increased granulation and less slough- changed to 3x/week.   11/23- added Oxy prn for hydrotherapy specifically.   11/25- con't wound care  11/28- very painful for pt- causing biggest limitation in therapy. Still getting hydrotherapy.  17. Hyperkalemia  11/24- K+ 5.1- will see if hemolysis- recheck in AM  11/25- K+ 5.0- will recheck Monday  11/28- K+ 4.9 - con't  monitoring 18. Insomnia  Continue Trazodone 75 mg QHS for sleep.  70. Morbid obesity BMI 46.88: provide dietary education  11/28- weight down- BMI 46.09- con't counseling.      LOS: 17 days A FACE TO FACE EVALUATION WAS PERFORMED  Demyan Fugate 12/10/2020, 10:18 AM

## 2020-12-10 NOTE — Progress Notes (Signed)
Physical Therapy Wound Treatment Patient Details  Name: SHAQUEL CHAVOUS MRN: [id number] Date of Birth: 1965/07/07  Today's Date: 12/10/2020 PT Individual Time: 1108-1130 PT Individual Time Calculation (min): 22 min   Subjective  Patient and Family Stated Goals: I'll be happy if you can help me get home. Date of Onset:  (unknow) Prior Treatments:  (unknown)  Pain Score:  4-6/10 with premedication  Wound Assessment  Pressure Injury 11/23/20 Coccyx Lower;Medial;Left Unstageable - Full thickness tissue loss in which the base of the injury is covered by slough (yellow, tan, gray, green or brown) and/or eschar (tan, brown or black) in the wound bed. Yellow area of sloug (Active)  Wound Image   12/10/20 1145  Dressing Type Barrier Film (skin prep);Foam - Lift dressing to assess site every shift;Gauze (Comment);Moist to moist;Normal saline moist dressing;Santyl 12/10/20 1145  Dressing Changed;Clean;Intact 12/10/20 1145  Dressing Change Frequency Daily 12/10/20 1145  State of Healing Early/partial granulation 12/10/20 1145  Site / Wound Assessment Pink;Red;Yellow 12/10/20 1145  % Wound base Red or Granulating 70% 12/10/20 1145  % Wound base Yellow/Fibrinous Exudate 30% 12/10/20 1145  % Wound base Black/Eschar 0% 12/10/20 1145  % Wound base Other/Granulation Tissue (Comment) 0% 12/10/20 1145  Peri-wound Assessment Intact 12/10/20 1145  Wound Length (cm) 5 cm 12/05/20 1800  Wound Width (cm) 3 cm 12/05/20 1800  Wound Depth (cm) 2 cm 12/05/20 1800  Wound Surface Area (cm^2) 15 cm^2 12/05/20 1800  Wound Volume (cm^3) 30 cm^3 12/05/20 1800  Margins Unattached edges (unapproximated) 12/10/20 1145  Drainage Amount Minimal 12/10/20 1145  Drainage Description Serous 12/10/20 1145  Treatment Cleansed;Debridement (Selective);Packing (Saline gauze) 12/10/20 1145      Hydrotherapy Pulsed lavage therapy - wound location: medial coccyx to R buttock Pulsed Lavage with Suction (psi): 8 psi Pulsed  Lavage with Suction - Normal Saline Used: 1000 mL Pulsed Lavage Tip: Tip with splash shield Selective Debridement Selective Debridement - Location: coccyx to r buttock Selective Debridement - Tools Used: Forceps;Scalpel Selective Debridement - Tissue Removed: yellow eschar   Wound Assessment and Plan  Wound Therapy - Assess/Plan/Recommendations Wound Therapy - Clinical Statement: pt's wound bed is progressing nicely.  Medial aspect of the would is a mix of slough and likely viable fascial tissue..  pt will benefit from continued Pulsed Lavage to soften eschar for selective debridement and decrease the bioburden in the wound. Wound Therapy - Functional Problem List: functional immobility Factors Delaying/Impairing Wound Healing: Altered sensation;Infection - systemic/local;Immobility Hydrotherapy Plan: Debridement;Dressing change;Patient/family education;Pulsatile lavage with suction Wound Therapy - Frequency: 6X / week Wound Therapy - Current Recommendations: PT Wound Therapy - Follow Up Recommendations: dressing changes by RN  Wound Therapy Goals- Improve the function of patient's integumentary system by progressing the wound(s) through the phases of wound healing (inflammation - proliferation - remodeling) by: Decrease Necrotic Tissue to: 20 % Decrease Necrotic Tissue - Progress: Progressing toward goal Increase Granulation Tissue to: 80% Increase Granulation Tissue - Progress: Progressing toward goal Goals/treatment plan/discharge plan were made with and agreed upon by patient/family: Yes Time For Goal Achievement: 7 days Wound Therapy - Potential for Goals: Good  Goals will be updated until maximal potential achieved or discharge criteria met.  Discharge criteria: when goals achieved, discharge from hospital, MD decision/surgical intervention, no progress towards goals, refusal/missing three consecutive treatments without notification or medical reason.  GP     Tessie Fass  Elisabetta Mishra 12/10/2020, 11:54 AM 12/10/2020  Ginger Carne., PT Acute Rehabilitation Services [phone]  (pager) [phone]  (office)

## 2020-12-10 NOTE — Plan of Care (Signed)
  Problem: RH Balance Goal: LTG Patient will maintain dynamic standing with ADLs (OT) Description: LTG:  Patient will maintain dynamic standing balance with assist during activities of daily living (OT)  Flowsheets (Taken 12/10/2020 1302) LTG: Pt will maintain dynamic standing balance during ADLs with: (LTG downgraded from independent as pt will not be safe to stand dynamically without support.) Minimal Assistance - Patient > 75% Note: LTG downgraded from independent as pt will not be safe to stand dynamically without support.   Problem: Sit to Stand Goal: LTG:  Patient will perform sit to stand in prep for activites of daily living with assistance level (OT) Description: LTG:  Patient will perform sit to stand in prep for activites of daily living with assistance level (OT) Flowsheets (Taken 12/10/2020 1302) LTG: PT will perform sit to stand in prep for activites of daily living with assistance level: (LTG downgraded due to pt's slow progress.) Contact Guard/Touching assist Note: LTG downgraded due to pt's slow progress.

## 2020-12-10 NOTE — Progress Notes (Addendum)
Physical Therapy Session Note  Patient Details  Name: [name] MRN: [id number] Date of Birth: 08-27-1965  Today's Date: 12/10/2020 PT Individual Time: 3094-0768 PT Individual Time Calculation (min): 60 min   Short Term Goals: Week 2:  PT Short Term Goal 1 (Week 2): Pt will perform least restrictive transfer with min A consistently PT Short Term Goal 2 (Week 2): Pt will perform sit to stand with LRAD and min A consistently PT Short Term Goal 3 (Week 2): Pt will perform bed mobility with min A consistently  Skilled Therapeutic Interventions/Progress Updates:    Pt received seated in w/c in room with wife [name] present for family education session. Per OT report pt continues to struggle with mobility as noted on Friday 11/25 and has experienced an overall decline in function from previous week. Pt initially declines participation in therapy session due to lack of "confidence", provided emotional support and encouraged pt to participate in safer therapy environment vs not attempting transfers, etc. until d/c home and not having additional support that he current has with CIR. Pt agreeable to participate in session. Pt with no complaints of pain this session even in buttocks and still present with anterior and R lateral lean that was noted on 11/25. However, on 11/25 pt stated that this positioning was due to pain and today he states it is not due to pain he just cannot maintain upright sitting. Squat pivot transfer w/c to/from mat table with mod A increasing to max A after several repetitions. Sit to stand with min A to [facility], however pt unable to stand upright with flexed knees (baseline) and severely flexed trunk (not baseline). Pt's wife able to observe during session and attempts to assist with squat pivot transfer mat table to w/c, however transfer becomes unsafe due to significant anterior lean and this therapist takes over to complete transfer safely with max A back to w/c. Pt taken back  upstairs to his room, stedy transfer back to bed due to decreased safety with transfers this date. Pt also reported some dizziness while seated in w/c, seated BP 94/75. Secure chat sent to medical team regarding ongoing functional deficits and pt not safe to d/c home tomorrow. Updated pt's wife on extension of LOS and pending testing to determine cause of change in function. Updated pt's nurse on change in status. Pt left in L sidelying in bed with needs in reach, wife present.  Therapy Documentation Precautions:  Precautions Precautions: Fall Precaution Comments: H/o CVA with residual L hemiparesis; dysarthric bowel incontinence Required Braces or Orthoses:  (L resting hand spint at baseline (variable schedule)) Restrictions Weight Bearing Restrictions: No    Therapy/Group: Individual Therapy   Excell Seltzer, PT, DPT, CSRS  12/10/2020, 12:34 PM

## 2020-12-10 NOTE — Progress Notes (Signed)
Physical Therapy Session Note  Patient Details  Name: [name] MRN: [id number] Date of Birth: [birth year]  Today's Date: 12/10/2020 PT Individual Time: 5638-7564 PT Individual Time Calculation (min): 60 min   Short Term Goals: Week 1:  PT Short Term Goal 1 (Week 1): Pt will transfer sidelying to sitting w/ min A from flat bed. PT Short Term Goal 1 - Progress (Week 1): Progressing toward goal PT Short Term Goal 2 (Week 1): Pt will transfer sit to stand w/ mod A. PT Short Term Goal 2 - Progress (Week 1): Met PT Short Term Goal 3 (Week 1): Pt will transfer bed <> w/c w/o lifting equipment. PT Short Term Goal 3 - Progress (Week 1): Met PT Short Term Goal 4 (Week 1): Pt will negotiate w/c w/ CGA 75' PT Short Term Goal 4 - Progress (Week 1): Met Week 2:  PT Short Term Goal 1 (Week 2): Pt will perform least restrictive transfer with min A consistently PT Short Term Goal 2 (Week 2): Pt will perform sit to stand with LRAD and min A consistently PT Short Term Goal 3 (Week 2): Pt will perform bed mobility with min A consistently Week 3:     Skilled Therapeutic Interventions/Progress Updates:      Therapy Documentation Precautions:  Precautions Precautions: Fall Precaution Comments: H/o CVA with residual L hemiparesis; dysarthric bowel incontinence Required Braces or Orthoses:  (L resting hand spint at baseline (variable schedule)) Restrictions Weight Bearing Restrictions: No   Therapy/Group: Individual Therapy Callie Fielding, [place] 12/10/2020, 1:01 PM

## 2020-12-10 NOTE — Progress Notes (Addendum)
Patient ID: [name], male   DOB: 01/02/1966, 55 y.o.   MRN: [id number]  Per PT, concerns reported about pt not making any progress in therapy, and concerns with patient going home since he is not able to complete transfers safely with wife. Medical team agreeable to d/c on 12/2. SW to discuss with patient and pt wife.   SW met with pt and pt wife in room to discuss above. Pt admits he is having some pain, and admits to depression as well. Pt reports he is bored and isnt doing anything in room. States he lays here and falls asleep around 7pm and wakes up at 1am. Once he wakes up he just watches the clock. Pt states he is often thinking about just going home. SW and pt wife discussed ways he can occupy his mind such as using audio books when he wakes up in the  middle of the night, and trying to stay up longer by using audio books or watching movies. SW discussed limiting movies as best as possible so he can be prepared for therapy in the morning. Pt is aware SW will continue to perform daily wellness check-ins.  Pt is agreeable to staying longer, trying a long acting pain medication, and mild depression medication. SW updated medical team on above.   SW updated Angela[facility] on changes to pt discharge date.   Loralee Pacas, MSW, [place] Office: [phone] Cell: [phone] Fax: [phone]

## 2020-12-10 NOTE — Plan of Care (Signed)
  Problem: RH Balance Goal: LTG Patient will maintain dynamic sitting balance (PT) Description: LTG:  Patient will maintain dynamic sitting balance with assistance during mobility activities (PT) Flowsheets (Taken 12/10/2020 1727) LTG: Pt will maintain dynamic sitting balance during mobility activities with:: (downgrade due to slow progress and decline in overall function) Minimal Assistance - Patient > 75%   Problem: Sit to Stand Goal: LTG:  Patient will perform sit to stand with assistance level (PT) Description: LTG:  Patient will perform sit to stand with assistance level (PT) Flowsheets (Taken 12/10/2020 1727) LTG: PT will perform sit to stand in preparation for functional mobility with assistance level: (downgrade due to slow progress and decline in overall function) Minimal Assistance - Patient > 75% Note: downgrade due to slow progress and decline in overall function   Problem: RH Bed Mobility Goal: LTG Patient will perform bed mobility with assist (PT) Description: LTG: Patient will perform bed mobility with assistance, with/without cues (PT). Flowsheets (Taken 12/10/2020 1727) LTG: Pt will perform bed mobility with assistance level of: (downgrade due to slow progress and decline in overall function) Moderate Assistance - Patient 50 - 74% Note: downgrade due to slow progress and decline in overall function   Problem: RH Bed to Chair Transfers Goal: LTG Patient will perform bed/chair transfers w/assist (PT) Description: LTG: Patient will perform bed to chair transfers with assistance (PT). Flowsheets (Taken 12/10/2020 1727) LTG: Pt will perform Bed to Chair Transfers with assistance level: (downgrade due to slow progress and decline in overall function) Moderate Assistance - Patient 50 - 74% Note: downgrade due to slow progress and decline in overall function   Problem: RH Car Transfers Goal: LTG Patient will perform car transfers with assist (PT) Description: LTG: Patient will  perform car transfers with assistance (PT). Flowsheets (Taken 12/10/2020 1727) LTG: Pt will perform car transfers with assist:: (downgrade due to slow progress and decline in overall function) Moderate Assistance - Patient 50 - 74% Note: downgrade due to slow progress and decline in overall function   Problem: RH Furniture Transfers Goal: LTG Patient will perform furniture transfers w/assist (OT/PT) Description: LTG: Patient will perform furniture transfers  with assistance (OT/PT). Flowsheets (Taken 12/10/2020 1727) LTG: Pt will perform furniture transfers with assist:: (downgrade due to slow progress and decline in overall function) Moderate Assistance - Patient 50 - 74% Note: downgrade due to slow progress and decline in overall function

## 2020-12-10 NOTE — Progress Notes (Shared)
Physical Therapy Discharge Summary  Patient Details  Name: [name] MRN: [id number] Date of Birth: [birth year]  {CHL IP REHAB PT TIME CALCULATION:304800500}   Patient has met {NUMBERS 0-12:18577} of 8 long term goals due to {due VA:4458483}.  Patient to discharge at [facility] level {LOA:3049010}.   Patient's care partner is independent to provide the necessary physical assistance at discharge. Pt's wife has been present for several family education sessions and reports she is able to care for patient at his current assist level.  Reasons goals not met: ***  Recommendation:  Patient will benefit from ongoing skilled PT services in home health setting to continue to advance safe functional mobility, address ongoing impairments in endurance, strength, balance, safety, independence with functional mobility, and minimize fall risk.  Equipment: Roho cushion, loaner TIS chair from [employer]  Reasons for discharge: {Reason for discharge:3049018}  Patient/family agrees with progress made and goals achieved: {Pt/Family agree with progress/goals:3049020}  PT Discharge Precautions/Restrictions   Vital Signs Therapy Vitals Temp: 99.2 F (37.3 C) Pulse Rate: 70 BP: 92/66 Oxygen Therapy SpO2: 97 % Pain   Pain Interference   Vision/Perception     Cognition   Sensation   Motor     Mobility   Locomotion     Trunk/Postural Assessment     Balance   Extremity Assessment              Excell Seltzer, PT, DPT, CSRS 12/10/2020, 7:34 AM

## 2020-12-10 NOTE — Progress Notes (Signed)
Physical Therapy Weekly Progress Note  Patient Details  Name: [name] MRN: [id number] Date of Birth: July 14, 1965  Beginning of progress report period: December 02, 2020 End of progress report period: December 10, 2020  Patient has met 0 of 3 short term goals.  Pt was making slow but steady progress towards therapy goals but within the last few days has exhibited a decline in overall function. He currently requires up to max A for bed mobility, max A for squat pivot transfers vs being dependent for transfers with use of stedy, is able to stand to [facility] with min to mod A but unable to maintain upright stance and unable to perform functional transfers with HW, and has had onset of anterior and R lateral lean. Pt initially attributed this lean to pain from his buttock wound but even when not experiencing pain he exhibits the lean. Pt's inability to maintain upright sitting posture has impeded his ability to perform safe transfers and progress with therapy. Pt has had several family education sessions with his wife [name] but she has been unable to assist him with transfers due to him being unsafe and exhibiting a decline in function. [name] also exhibits an overall depressed mood and has been refusing participation in therapy sessions due to pain, fatigue, and decreased confidence in his abilities. Pt's team is aware of barriers and is working to address them, LOS has been extended to allow time for improvements prior to d/c home.  Patient continues to demonstrate the following deficits muscle weakness and muscle joint tightness, decreased cardiorespiratoy endurance, abnormal tone and unbalanced muscle activation, decreased initiation, decreased problem solving, and decreased safety awareness, and decreased sitting balance, decreased standing balance, decreased postural control, hemiplegia, and decreased balance strategies and therefore will continue to benefit from skilled PT intervention to  increase functional independence with mobility.  Patient not progressing toward long term goals.  See goal revision..  Plan of care revisions: downgraded goals to min to mod A overall due to decline in overall functional level.  PT Short Term Goals Week 2:  PT Short Term Goal 1 (Week 2): Pt will perform least restrictive transfer with min A consistently PT Short Term Goal 1 - Progress (Week 2): Not met PT Short Term Goal 2 (Week 2): Pt will perform sit to stand with LRAD and min A consistently PT Short Term Goal 2 - Progress (Week 2): Progressing toward goal PT Short Term Goal 3 (Week 2): Pt will perform bed mobility with min A consistently PT Short Term Goal 3 - Progress (Week 2): Progressing toward goal Week 3:  PT Short Term Goal 1 (Week 3): =LTG due to ELOS   Therapy Documentation Precautions:  Precautions Precautions: Fall Precaution Comments: H/o CVA with residual L hemiparesis; dysarthric bowel incontinence Required Braces or Orthoses:  (L resting hand spint at baseline (variable schedule)) Restrictions Weight Bearing Restrictions: No  Therapy/Group: Individual Therapy   Excell Seltzer, PT, DPT, CSRS 12/10/2020, 5:10 PM

## 2020-12-11 LAB — GLUCOSE, CAPILLARY
Glucose-Capillary: 118 mg/dL — ABNORMAL HIGH (ref 70–99)
Glucose-Capillary: 159 mg/dL — ABNORMAL HIGH (ref 70–99)
Glucose-Capillary: 96 mg/dL (ref 70–99)
Glucose-Capillary: 115 mg/dL — ABNORMAL HIGH (ref 70–99)

## 2020-12-11 MED ORDER — MORPHINE SULFATE ER 15 MG PO TBCR
15.0000 mg | EXTENDED_RELEASE_TABLET | Freq: Two times a day (BID) | ORAL | Status: DC
  Administered 2020-12-11 – 2020-12-14 (×6): 15 mg via ORAL
  Filled 2020-12-11 (×6): qty 1

## 2020-12-11 MED ORDER — CITALOPRAM HYDROBROMIDE 20 MG PO TABS
20.0000 mg | ORAL_TABLET | Freq: Every day | ORAL | Status: DC
  Administered 2020-12-12 – 2020-12-14 (×3): 20 mg via ORAL
  Filled 2020-12-11 (×4): qty 1

## 2020-12-11 NOTE — Progress Notes (Signed)
Patient ID: [name], male   DOB: 26-Mar-1965, 55 y.o.   MRN: [id number]  SW met with pt and pt wife in room to provide updates from team conference, and d/c remains 12/2. Pt and pt wife feel they are able to manage pt care needs when he goes home. SW asked pt wife being here tomorrow for w/c eval with [facility]. She is able to be present. SW will also follow-up with [employer] about delivering roho cushion about item being delivered here to hospital since she did not get a phone call about coordinating delivery on Monday. SW will follow-up with updates.   Loralee Pacas, MSW, [place] Office: [phone] Cell: [phone] Fax: [phone]

## 2020-12-11 NOTE — Progress Notes (Signed)
Occupational Therapy Session Note  Patient Details  Name: [name] MRN: [id number] Date of Birth: June 17, 1965  Today's Date: 12/11/2020 OT Individual Time: 1330-1425 OT Individual Time Calculation (min): 55 min    Short Term Goals: Week 3:  OT Short Term Goal 1 (Week 3): STGs = LTGs  Skilled Therapeutic Interventions/Progress Updates:    Pt resting in TIS w/c upon arrival with wife present. Pt with slight lateral lean to Lt but able to correct. Pt agreeable to going to gym but once in gym pt requested to use bathroom. Pt declined use of BSC and requested to return to bed to use urinal. Pt unable to initiate squat pivot transfer to bed and required use of Stedy for transfer to bed. Sit<>stand in [place] with min A. Sit>supine with max A. Pt required assistance doffing pants and positioning urinal. Pt unsuccessful with urinating. Pt able to reposition in bed wihtout assistance. Pt remained in sidelying position in bed. Wife present and all needs withn reach.   Therapy Documentation Precautions:  Precautions Precautions: Fall Precaution Comments: H/o CVA with residual L hemiparesis; dysarthric bowel incontinence Required Braces or Orthoses:  (L resting hand spint at baseline (variable schedule)) Restrictions Weight Bearing Restrictions: No Pain:  Pt reports 6/10 pain in buttocks; repositioned   Therapy/Group: Individual Therapy  [name] 12/11/2020, 2:51 PM

## 2020-12-11 NOTE — Progress Notes (Signed)
Occupational Therapy Session Note  Patient Details  Name: [name] MRN: [id number] Date of Birth: 01-23-1965  Today's Date: 12/11/2020 OT Individual Time: 5956-3875 OT Individual Time Calculation (min): 70 min    Short Term Goals: Week 3:  OT Short Term Goal 1 (Week 3): STGs = LTGs  Skilled Therapeutic Interventions/Progress Updates:    Pt resting in bed upon arrival. Mod A for supine>sit EOB with use of bed rails. Squat pivot transfer to w/c with max A. Pt unable to reposition in w/c to scoot back into chair. Stedy required for sit<>stand to reposition in w/c. Pt engaged in UB bathing and LB dressing tasks with sit<>stand from w/c. OTA exited room to obtain supplies for approx 5 mins. Upon return pt noted with significant lean to Lt with slight forward trunk flexion. Pt attempted to correct posture but unable to and required max A. Pt able to thread pants over BLE using reacher. Pt did not initiate sit<>stand from w/c to pull pants over hips but was able to use Stedy with mod A. Pt dependent for pulling pants over hips. Pt with significant forward posture when standing in [place]. Pt transferred to TIS w/c with Stedy. Pt commented that TIS was more comfortable. Pt remained in w/c with all needs within reach.  Therapy Documentation Precautions:  Precautions Precautions: Fall Precaution Comments: H/o CVA with residual L hemiparesis; dysarthric bowel incontinence Required Braces or Orthoses:  (L resting hand spint at baseline (variable schedule)) Restrictions Weight Bearing Restrictions: No   Pain:  Pt c/o 5/10 pain in buttocks; repositioned, pain meds admin at beginning of session.    Therapy/Group: Individual Therapy  [name] 12/11/2020, 9:48 AM

## 2020-12-11 NOTE — Plan of Care (Signed)
  Problem: RH Balance Goal: LTG Patient will maintain dynamic standing with ADLs (OT) Description: LTG:  Patient will maintain dynamic standing balance with assist during activities of daily living (OT)  Flowsheets (Taken 12/11/2020 1430) LTG: Pt will maintain dynamic standing balance during ADLs with: (downgraded due to progress) Moderate Assistance - Patient 50 - 74% Note: Downgraded JLS    Problem: Sit to Stand Goal: LTG:  Patient will perform sit to stand in prep for activites of daily living with assistance level (OT) Description: LTG:  Patient will perform sit to stand in prep for activites of daily living with assistance level (OT) Flowsheets (Taken 12/11/2020 1430) LTG: PT will perform sit to stand in prep for activites of daily living with assistance level: (downgraded due to progress) Moderate Assistance - Patient 50 - 74% Note: downgraded due to progress   Problem: RH Grooming Goal: LTG Patient will perform grooming w/assist,cues/equip (OT) Description: LTG: Patient will perform grooming with assist, with/without cues using equipment (OT) Flowsheets (Taken 12/11/2020 1430) LTG: Pt will perform grooming with assistance level of: Independent with assistive device    Problem: RH Dressing Goal: LTG Patient will perform lower body dressing w/assist (OT) Description: LTG: Patient will perform lower body dressing with assist, with/without cues in positioning using equipment (OT) Flowsheets (Taken 12/11/2020 1430) LTG: Pt will perform lower body dressing with assistance level of: (downgraded due to progress) Moderate Assistance - Patient 50 - 74% Note: downgraded due to progress   Problem: RH Toileting Goal: LTG Patient will perform toileting task (3/3 steps) with assistance level (OT) Description: LTG: Patient will perform toileting task (3/3 steps) with assistance level (OT)  Outcome: Not Applicable Flowsheets (Taken 12/11/2020 1430) LTG: Pt will perform toileting task (3/3  steps) with assistance level: (d/c goal) -- Note: Pt will required A to complete this task- can only assist with urinal   Problem: RH Toilet Transfers Goal: LTG Patient will perform toilet transfers w/assist (OT) Description: LTG: Patient will perform toilet transfers with assist, with/without cues using equipment (OT) Flowsheets (Taken 12/11/2020 1430) LTG: Pt will perform toilet transfers with assistance level of: (downgraded due to progress) Supervision/Verbal cueing Note: downgraded due to progress

## 2020-12-11 NOTE — Progress Notes (Signed)
PROGRESS NOTE   Subjective/Complaints:  Pt reported he's depressed- finally admitted it when asked directly.  Willing to try meds for it- will start Celexa 20 mg daily.  Also, he very clear that the lean and increased help he's requiring is due to a sharp increase in pain with therapy- also he's leaning to get off butt as much as possible.  PT thinks some is weakness- which could be, but sounds like pain is also limiting.  Willing to start Long acting pain meds for pain.   ROS:   Pt denies SOB, abd pain, CP, N/V/C/D, and vision changes - pain in buttocks still  Objective:   DG Sacrum/Coccyx  Result Date: 12/10/2020 CLINICAL DATA:  Sacral decubitus ulcer. EXAM: SACRUM AND COCCYX - 2+ VIEW COMPARISON:  08/07/2019.  Abdomen pelvis CT dated 11/14/2020. FINDINGS: Left hip prosthesis in satisfactory position and alignment. No bone destruction or periosteal reaction seen. No soft tissue gas. Mild lower lumbar spine degenerative changes. IMPRESSION: No radiographic evidence of osteomyelitis. Electronically Signed   By: Claudie Revering M.D.   On: 12/10/2020 12:51   MR BRAIN WO CONTRAST  Result Date: 12/11/2020 CLINICAL DATA:  Acute neurologic deficit EXAM: MRI HEAD WITHOUT CONTRAST TECHNIQUE: Multiplanar, multiecho pulse sequences of the brain and surrounding structures were obtained without intravenous contrast. COMPARISON:  12/12/2016 FINDINGS: Brain: No acute infarct, mass effect or extra-axial collection. Unchanged right para falcine calcified mass with underlying gliosis. 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 Moderate chronic white matter disease and mild volume loss. Scattered peripheral chronic microhemorrhages. The midline structures are normal. Vascular: Major flow voids are preserved. Skull and upper cervical spine: Normal calvarium and skull base. Visualized upper cervical spine and soft tissues are normal.  Sinuses/Orbits:No paranasal sinus fluid levels or advanced mucosal thickening. No mastoid or middle ear effusion. Normal orbits. IMPRESSION: 1. No acute intracranial abnormality. 2. 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 3. Moderate chronic white matter disease and mild volume loss. 4. Unchanged parafalcine calcified mass underlying gliosis. Electronically Signed   By: Ulyses Jarred M.D.   On: 12/11/2020 00:09   Recent Labs    12/10/20 0648  WBC 4.9  HGB 9.4*  HCT 29.5*  PLT 198     Recent Labs    12/10/20 0648  NA 134*  K 4.9  CL 100  CO2 27  GLUCOSE 125*  BUN 17  CREATININE 1.40*  CALCIUM 8.7*    Intake/Output Summary (Last 24 hours) at 12/11/2020 0902 Last data filed at 12/11/2020 6606 Gross per 24 hour  Intake 354 ml  Output 850 ml  Net -496 ml     Pressure Injury 11/23/20 Coccyx Lower;Medial;Left Unstageable - Full thickness tissue loss in which the base of the injury is covered by slough (yellow, tan, gray, green or brown) and/or eschar (tan, brown or black) in the wound bed. Yellow area of sloug (Active)  11/23/20 1836  Location: Coccyx  Location Orientation: Lower;Medial;Left  Staging: Unstageable - Full thickness tissue loss in which the base of the injury is covered by slough (yellow, tan, gray, green or brown) and/or eschar (tan, brown or black) in the wound bed.  Wound Description (Comments): Yellow  area of slough  Present on Admission: Yes     Pressure Injury 11/23/20 Coccyx Left;Lower;Medial Unstageable - Full thickness tissue loss in which the base of the injury is covered by slough (yellow, tan, gray, green or brown) and/or eschar (tan, brown or black) in the wound bed. Thick yellow and bro (Active)  11/23/20 1836  Location: Coccyx  Location Orientation: Left;Lower;Medial  Staging: Unstageable - Full thickness tissue loss in which the base of the injury is covered by slough (yellow, tan, gray, green or brown) and/or eschar (tan, brown or  black) in the wound bed.  Wound Description (Comments): Thick yellow and brown tissue. Unable to view healthy tissue.  Present on Admission: Yes    Physical Exam: Vital Signs Blood pressure 119/67, pulse 73, temperature 98.3 F (36.8 C), temperature source Oral, resp. rate 17, height 5' 2.99" (1.6 m), weight 118 kg, SpO2 95 %.      General: awake, alert, appropriate, laying left side- woke from sleep; NAD HENT: conjugate gaze; oropharynx moist CV: regular rate; no JVD Pulmonary: CTA B/L; no W/R/R- good air movement GI: soft, NT, ND, (+)BS Psychiatric: appropriate but much more flat, depressed appearing;  Neurological: Ox3  alert;  dysarthric; L hemiparesis with spasticity- has ~ 2-3/5 strength in LUE and 5-/5 in RUE-no change in strength today- still cannot test legs- won't lay on backside.   Skin: Warm and dry.  Wounds with dressing intact- sacral ulcer/coccyx- full thickness tissue loss-- less slough- a little pink seen in wound bed- cannot see bone Musc: No edema in extremities.  No tenderness in extremities. Dysarthria Motor: LUE 1+ to 2/5 prox to distal. MAS 3wrist/finger flexors and biceps.  LLE 3-/5 prox to 3/5 distally without  tone, stable   Assessment/Plan: 1. Functional deficits which require 3+ hours per day of interdisciplinary therapy in a comprehensive inpatient rehab setting. Physiatrist is providing close team supervision and 24 hour management of active medical problems listed below. Physiatrist and rehab team continue to assess barriers to discharge/monitor patient progress toward functional and medical goals  Care Tool:  Bathing    Body parts bathed by patient: Left arm, Chest, Abdomen, Front perineal area, Left upper leg, Right upper leg, Face   Body parts bathed by helper: Buttocks, Right lower leg, Left lower leg     Bathing assist Assist Level: Moderate Assistance - Patient 50 - 74%     Upper Body Dressing/Undressing Upper body dressing   What  is the patient wearing?: Pull over shirt    Upper body assist Assist Level: Moderate Assistance - Patient 50 - 74%    Lower Body Dressing/Undressing Lower body dressing      What is the patient wearing?: Incontinence brief, Pants     Lower body assist Assist for lower body dressing: Maximal Assistance - Patient 25 - 49%     Toileting Toileting Toileting Activity did not occur (Clothing management and hygiene only): N/A (no void or bm)  Toileting assist Assist for toileting: Moderate Assistance - Patient 50 - 74%     Transfers Chair/bed transfer  Transfers assist     Chair/bed transfer assist level: Moderate Assistance - Patient 50 - 74%     Locomotion Ambulation   Ambulation assist   Ambulation activity did not occur: Safety/medical concerns          Walk 10 feet activity   Assist  Walk 10 feet activity did not occur: Safety/medical concerns        Walk 50 feet activity  Assist Walk 50 feet with 2 turns activity did not occur: Safety/medical concerns         Walk 150 feet activity   Assist Walk 150 feet activity did not occur: Safety/medical concerns         Walk 10 feet on uneven surface  activity   Assist Walk 10 feet on uneven surfaces activity did not occur: Safety/medical concerns         Wheelchair     Assist Is the patient using a wheelchair?: Yes Type of Wheelchair: Manual    Wheelchair assist level: Supervision/Verbal cueing Max wheelchair distance: 40    Wheelchair 50 feet with 2 turns activity    Assist        Assist Level: Supervision/Verbal cueing   Wheelchair 150 feet activity     Assist      Assist Level: Maximal Assistance - Patient 25 - 49%   Blood pressure 119/67, pulse 73, temperature 98.3 F (36.8 C), temperature source Oral, resp. rate 17, height 5' 2.99" (1.6 m), weight 118 kg, SpO2 95 %.  Medical Problem List and Plan: 1.  Functional deficits secondary to debility related to  new onset a fib and multiple medical           con't CIR_ will delay d/c to 12/2- Friday- team conference today to f/u on how he's doing.  2.  Antithrombotics: -DVT/anticoagulation:  Mechanical: Sequential compression devices, below knee Bilateral lower extremities  11/29- no Lovenox due to gastric ulcers/GIB             -antiplatelet therapy: N/a 3. Pain/spasticity: continue tylenol # 3 prn for pain.              -pt followed by [facility], has had botox in past. Looks as if these are needed again to wrist and finger flexors  11/15- will have pt see Dr Letta Pate for Botox after d/c.   11/23- not taking pain meds- per nursing- advised pt to ask for them- to get pain controlled- also gave him an additional oxy daily for hydrotherapy.   11/29- added MS Contin 15 mg BID for better pain control. Will monitor 4. Mood: LCSW to follow for evaluation and support.              -antipsychotic agents: N/A 5. Neuropsych: This patient is capable of making decisions on his own behalf. 6. Skin/Wound Care: Routine pressure relief Mesures.              --added beneprotein.  7. Fluids/Electrolytes/Nutrition: strict I/Os             --Monitor renal status with serial checks.  8. A fib new onset: Monitor HR TID and for symptoms with increase in activity.             --continue Coreg, Cardizem and amiodarone             --Plans for repeat EGD in 8 weeks for clearance on addition of DOAC  Rate controlled 11/19 9. Gout flare: Continue Colchicine daily for now. Pt says it's improving.  11/16- Sx's resolved as of 11/15- off Prednisone 10. Acute on chronic AKI?: Due to NSAIDS for gout flare. Has resolved with SCr -1.3? Baseline .             -- 1.4 11/12--near baseline---continue to follow  11/17- IVFs 75cc/hour x 24 hours due to renal issues  Creatinine 1.29 on 1/18, labs ordered for Monday  11/21- Cr 1.57 again today- it appears  to be higher than baseline- will recheck Wednesday  11/23- Will recheck BMP in AM  11/24-  Cr 1.64- will give IVFs again and recheck in AM-   11/25- Baseline Cr 1.3-1.4, however still at 1.50 after IVFs0 BUN 21- not drinking enough- again asked pt to drink more.   11/28- Cr down to 1.40 and BUN 17- will con't off IVFs.   11/29- will recheck Thursday 11. T2DM with hyperglycemia: Hgb A1c-6.7. Monitor BS ac/hs and use SSI for elevated BS.  CBG (last 3)  Recent Labs    12/10/20 1633 12/10/20 2010 12/11/20 0546  GLUCAP 94 95 118*    --Added CM restrictions. Continue to hold metformin 11/28- CBGs look OK- con't regimen 12. Chronic diastolic CHF: Monitor I/O. Check daily weights.  --Will monitor for signs of overload. On Lipitor.              --Ace on hold due to recent AKI.  13. Multiple gastric/colonic ulcers: On PPI BID.             --monitor for signs of bleeding.              Hemoglobin 10.1 on 11/14, labs ordered for Monday  Will con't to monitor  14.  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  Loose stools likely colchicine, gout flare responding to prednisone , will d/ced colchicine   11/15- doing better-con't to monitor 16. Pressure ulcers on backside  11/15- has unstageable full thickness on coccyx - will con't local wound care- if doesn't improve, will call [facility]- seen by our WTA.   11/29-looking like less slough in pictures- no bone seen- Sacral xray looks OK- no osteomyelitis on xray 17. Hyperkalemia  11/24- K+ 5.1- will see if hemolysis- recheck in AM  11/25- K+ 5.0- will recheck Monday  11/28- K+ 4.9 - con't monitoring 18. Insomnia  Continue Trazodone 75 mg QHS for sleep.  80. Morbid obesity BMI 46.88: provide dietary education  11/28- weight down- BMI 46.09- con't counseling.  20. Depression 11/29- will start Celexa 20 mg daily 21. Decline in function/anterior lean  11/29- pt insists it's pain- will d/w team more at team conference     LOS: 18 days A FACE TO [place] 12/11/2020, 9:02 AM

## 2020-12-11 NOTE — Progress Notes (Signed)
Physical Therapy Session Note  Patient Details  Name: [name] MRN: [id number] Date of Birth: [birth year]  Today's Date: 12/11/2020 PT Individual Time: 1000-1047 PT Individual Time Calculation (min): 47 min   Short Term Goals: Week 3:  PT Short Term Goal 1 (Week 3): =LTG due to ELOS  Skilled Therapeutic Interventions/Progress Updates:    Pt received seated in TIS chair in room, agreeable to PT session. Pt reports ongoing pain in his buttocks at site of wound, not rated and reports being premedicated prior to start of therapy session, declines further intervention. Pt transitioned from semi-reclined position in TIS chair to upright position, able to maintain sitting balance with no anterior or lateral lean noted this date. However, as session progresses and with onset of fatigue pt does have onset of anterior and L lateral lean. Pt also reports lean is due to pain in his buttocks. Encouraged pt to transfer to standard w/c this date to demonstrate w/c mobility, pt declines. Encouraged pt to perform squat pivot transfers bed to/from chair to prepare for d/c home later this week, pt declines. Pt agreeable to stand to stedy this date. Sit to stand with mod A from [place] chair to stedy. Pt reports feeling dizzy in standing, returned to sitting. Sitting BP 119/82. Returned to standing and BP reading taken from perched position on stedy seat, BP 141/92. Pt returned to sitting, BP 122/86. Pt reports dizziness symptoms resolve in seated position, declines further standing activity this session. Attempt to have pt perform seated lateral leans from w/c onto laterally placed surface, pt requires max cueing for sequencing of task. Pt appears fatigued and declines further intervention in therapy session. Pt agreeable to remain seated in chair. Pt left semi-reclined in TIS chair in room with needs in reach. Pt missed 13 min of scheduled therapy session due to fatigue.  Therapy Documentation Precautions:   Precautions Precautions: Fall Precaution Comments: H/o CVA with residual L hemiparesis; dysarthric bowel incontinence Required Braces or Orthoses:  (L resting hand spint at baseline (variable schedule)) Restrictions Weight Bearing Restrictions: No General: PT Amount of Missed Time (min): 13 Minutes PT Missed Treatment Reason: Patient fatigue    Therapy/Group: Individual Therapy   Excell Seltzer, PT, DPT, CSRS  12/11/2020, 5:31 PM

## 2020-12-11 NOTE — Patient Care Conference (Signed)
Inpatient RehabilitationTeam Conference and Plan of Care Update Date: 12/11/2020   Time: 11:11 AM    Patient Name: [name]      Medical Record Number: [id number]  Date of Birth: Mar 07, 1965 Sex: Male         Room/Bed: 5C02C/5C02C-01 Payor Info: Payor: [place] / Plan: BCBS COMM PPO / Product Type: *No Product type* /    Admit Date/Time:  11/23/2020  6:08 PM  Primary Diagnosis:  [facility] Problems: Principal Problem:   Debility Active Problems:   Pressure injury of skin   Acute gastric ulcer   Controlled type 2 diabetes mellitus with hyperglycemia, without long-term current use of insulin ([place])   New onset atrial fibrillation [facility])    Expected Discharge Date: Expected Discharge Date: 12/14/20  Team Members Present: Physician leading conference: Dr. Courtney Heys Social Worker Present: Loralee Pacas, [place] Nurse Present: Dorthula Nettles, RN PT Present: Excell Seltzer, PT OT Present: Roanna Epley, COTA;Jennifer Tamala Julian, OT PPS Coordinator present : Gunnar Fusi, SLP     Current Status/Progress Goal Weekly Team Focus  Bowel/Bladder   cont x2  remain cont  toliet q2   Swallow/Nutrition/ Hydration             ADL's   LB dress-mod A: LB bathe-min A: UB ADL-supervison; squat pivot transfers-max A. toileting-mod A  min A overall  tranfsers, d/c planning, safety awareness   Mobility   max A bed mobility, max A squat pivot transfers vs stedy, overall decline in function over past few days with onset of anterior and R lateral lean  downgraded to mod A for transfers, w/c level  transfers, participation, d/c planning   Communication             Safety/Cognition/ Behavioral Observations            Pain   rates pain 2/10  remain less than 2  assess prn and qshift   Skin   wound to scarum  Promote healing and prevention of infection.  bid dressing changes     Discharge Planning:  Pt to d/c to home with his wife who will provide 24/7 care. Pt  has [facility] for HHPT/OT/aide/RN (wound care). Fam edu completed.   Team Discussion: Started Celexa for depression/mood. Starting long acting MS Contin for pain control. X-ray of sacrum was negative for changes. Brain MRI is stable. Refusing Ted  hose. Still receiving hydro-therapy for sacral wound. Has HH, Roho cushion, and spouse has FMLA. Balance better today. Refused transfers with PT. Would like to get tilt-n-space [facility] for patient but will depend on age of current WC. Patient has had a change in function so it might be possible. Patient on target to meet rehab goals: no, PT/OT downgraded goals to Mod assist.  *See Care Plan and progress notes for long and short-term goals.   Revisions to Treatment Plan:  Adjusting medications and finalizing discharge plans.  Teaching Needs: Family education, medication/pain management, skin/wound care, safety awareness, transfer training, etc.   Current Barriers to Discharge: Decreased caregiver support, Home enviroment access/layout, Wound care, Lack of/limited family support, Weight, Weight bearing restrictions, and Medication compliance  Possible Resolutions to Barriers: Family education DME ordered Encourage patient to participate in therapy Off-loading of bottom     Medical Summary Current Status: Xray doen of sacrum- no osteomyelitis; brain MRI is stable; sacral pain is biggest limiter; now taking prns- hydrotherapy M/W/F-  Barriers to Discharge: Decreased family/caregiver support;Behavior;Home enviroment access/layout;Medical stability;Medication compliance;Weight bearing  restrictions;Wound care;Other (comments)  Barriers to Discharge Comments: H/H set up- getting ROHO cushion for wound; family training- wife wasn't safe in transfer- today could sit up better; refused transfers today; severe/worsening depression Possible Resolutions to [employer] Focus: tilt and space w/c? Will see if possible to do a new w/c- due to lack of pressure  relief. will see if can get w/c eval- and see if possible to get a new one- wife taking FMLA to take care of him- needs to sit up to progress- downgraded goals to min A- and likely to mod A; biggest barrier his inability to participate in therapy- and pain- had to use Steady- stroke was 12/2016.  d/c changed to 12/2   Continued Need for Acute Rehabilitation Level of Care: The patient requires daily medical management by a physician with specialized training in physical medicine and rehabilitation for the following reasons: Direction of a multidisciplinary physical rehabilitation program to maximize functional independence : Yes Medical management of patient stability for increased activity during participation in an intensive rehabilitation regime.: Yes Analysis of laboratory values and/or radiology reports with any subsequent need for medication adjustment and/or medical intervention. : Yes   I attest that I was present, lead the team conference, and concur with the assessment and plan of the team.   Cristi Loron 12/11/2020, 4:13 PM

## 2020-12-12 LAB — GLUCOSE, CAPILLARY
Glucose-Capillary: 109 mg/dL — ABNORMAL HIGH (ref 70–99)
Glucose-Capillary: 124 mg/dL — ABNORMAL HIGH (ref 70–99)
Glucose-Capillary: 95 mg/dL (ref 70–99)
Glucose-Capillary: 117 mg/dL — ABNORMAL HIGH (ref 70–99)

## 2020-12-12 NOTE — Progress Notes (Signed)
Patient ID: [name], male   DOB: August 13, 1965, 55 y.o.   MRN: [id number]  SW received message from pt wife who asked if pt can get a hospital bed. SW waiting on updates from medical team if this can be supported.   *SW returned phone call to pt wife [name] [phone]) to inform medical team supported hospital bed. [employer] informed will confirm if they have the recommended mattress and will follow-up. SW spoke with Sarah/[facility] [phone])- no air mattress. SW ordered hospital bed with low air loss mattress with [employer]. SW updated pt wife [name] on above. She requested information on how to apply for Medicaid. SW emailed her link and MCD application.   Loralee Pacas, MSW, [place] Office: [phone] Cell: [phone] Fax: [phone]

## 2020-12-12 NOTE — Progress Notes (Signed)
Occupational Therapy Note  Patient Details  Name: CHINEDU AGUSTIN MRN: [id number] Date of Birth: 01-Sep-1965  Today's Date: 12/12/2020 OT Missed Time: 80 Minutes Missed Time Reason: Pain;Other (comment) (hydrotherapy immediately before scheduled session)  Pt resting in bed upon arrival with wife present. Pt stated that hydrotherapy had just finished and he was still in considerable pain. Pt missed 45 mins skilled OT services 2/2 pain.   Leotis Shames [facility] 12/12/2020, 11:42 AM

## 2020-12-12 NOTE — Progress Notes (Addendum)
Physical Therapy Session Note  Patient Details  Name: [name] MRN: [id number] Date of Birth: Oct 23, 1965  Today's Date: 12/12/2020 PT Individual Time: 0905-1010 PT Individual Time Calculation (min): 65 min   Short Term Goals: Week 3:  PT Short Term Goal 1 (Week 3): =LTG due to ELOS  Skilled Therapeutic Interventions/Progress Updates:     Patient in TIS w/c upon PT arrival. Patient alert and agreeable to PT session. Patient denied pain during session.  Corene Cornea, ATP from [facility] present for w/c evaluation for patient prior to d/c.   Patient presents with a unstageable full thickness pressure injury at the L medial coccyx, chronic L hemiplegia with R trunk lean and poor trunk control, loss of functional use of L hand, and decreased strength of L lower extremity when compared to R, and decline in functional mobility with generalized deconditioning from multiple medical complications.   The patient is unable to perform independent pressure relief in a manual w/c due to decreased strength and trunk control limiting ability and safety with pressure relief strategies. Patient will benefit from a tilt-is-space feature for positioning and pressure relief, as well as a Roho style cushion for improved pressure distribution due to coccygeal wound.    Patient requires a contour back support to improve postural control and positioning in sitting to reduce risk of falls or pressure injury.   Patient able to propel a Light weight manual w/c up to 100 feet demonstrating household level navigation with supervision using R upper extremity and B lower extremities with reduced seat height, 16", due to tibia length in sitting. Patient demonstrates decreased speed and increased effort with fatigue in lightweight manual wheelchair, demonstrating concern for successful propulsion in a heavier w/c, such as a TIS with one arm drive.   Patient's wife drives a sedan, and will provide patient transport to  physician appointments and community access. Requires w/c to be transported in her trunk and will need folding capability and reduced weight for lifting the chair to reduce caregiver burden.  Discussed benefits of the [employer] Liberty FT folding tilt-in-space manual wheelchair for pressure relief and positioning, manual propulsion with light weight chair, and ease of transport for reduced caregiver burden. Recommending contour back, L break extender, and back wheel attachments for modification to transport chair for reduced width in tight home spaces and for lighter weight dependent transport for reduced caregiver burden. Provided patient and his wife information and handout on this wheelchair and both in agreement that this chair will meet their needs. ATP obtained patient measurements following discussion.   Discussed benefits of hospital bed use at home for reduced burden of care with transfers and bed mobility and improved positioning and pressure relief with gel overlay and use of electric bed features to promote wound healing and reduce risk for skin breakdown. Patient's wife states that their personal bed is very high off the floor and that they would be able to set up a hospital bed in the living room.   Patient performed a squat pivot transfer manual w/c>bed with use of bed rail to the R with min A and PT blocking L knee for safety.  Also, performed dependent transfer TIS w/c>manual w/c with min A for standing using Denna Haggard. Lift device used to allow for transfer of Roho cushion from one w/c to the next during transfer.   Patient performed sit to supine with mod A for lower extremity management with cues for use of bottom elbow for trunk control and  transitioning through side-lying. Positioned patient in L side-lying with pillows under his L arm and between his knees for pressure relief.   Patient in bed, positioned as above, with his wife in the room at end of session with breaks locked,  bed alarm set, and all needs within reach.   Therapy Documentation Precautions:  Precautions Precautions: Fall Precaution Comments: H/o CVA with residual L hemiparesis; dysarthric bowel incontinence Required Braces or Orthoses:  (L resting hand spint at baseline (variable schedule)) Restrictions Weight Bearing Restrictions: No    Therapy/Group: Individual Therapy  Erroll Wilbourne L Brie Eppard PT, DPT  12/12/2020, 4:20 PM

## 2020-12-12 NOTE — Progress Notes (Signed)
PROGRESS NOTE   Subjective/Complaints: No new complaints this morning Family is at bedside on the phone Missed PT due to pain today Did tolerate 70 minutes of OT  ROS:   Pt denies SOB, abd pain, CP, N/V/C/D, and vision changes - pain in buttocks still  Objective:   MR BRAIN WO CONTRAST  Result Date: 12/11/2020 CLINICAL DATA:  Acute neurologic deficit EXAM: MRI HEAD WITHOUT CONTRAST TECHNIQUE: Multiplanar, multiecho pulse sequences of the brain and surrounding structures were obtained without intravenous contrast. COMPARISON:  12/12/2016 FINDINGS: Brain: No acute infarct, mass effect or extra-axial collection. Unchanged right para falcine calcified mass with underlying gliosis. 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 Moderate chronic white matter disease and mild volume loss. Scattered peripheral chronic microhemorrhages. The midline structures are normal. Vascular: Major flow voids are preserved. Skull and upper cervical spine: Normal calvarium and skull base. Visualized upper cervical spine and soft tissues are normal. Sinuses/Orbits:No paranasal sinus fluid levels or advanced mucosal thickening. No mastoid or middle ear effusion. Normal orbits. IMPRESSION: 1. No acute intracranial abnormality. 2. 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 3. Moderate chronic white matter disease and mild volume loss. 4. Unchanged parafalcine calcified mass underlying gliosis. Electronically Signed   By: Ulyses Jarred M.D.   On: 12/11/2020 00:09   Recent Labs    12/10/20 0648  WBC 4.9  HGB 9.4*  HCT 29.5*  PLT 198     Recent Labs    12/10/20 0648  NA 134*  K 4.9  CL 100  CO2 27  GLUCOSE 125*  BUN 17  CREATININE 1.40*  CALCIUM 8.7*    Intake/Output Summary (Last 24 hours) at 12/12/2020 1304 Last data filed at 12/11/2020 1825 Gross per 24 hour  Intake 353 ml  Output --  Net 353 ml     Pressure  Injury 11/23/20 Coccyx Lower;Medial;Left Unstageable - Full thickness tissue loss in which the base of the injury is covered by slough (yellow, tan, gray, green or brown) and/or eschar (tan, brown or black) in the wound bed. Yellow area of sloug (Active)  11/23/20 1836  Location: Coccyx  Location Orientation: Lower;Medial;Left  Staging: Unstageable - Full thickness tissue loss in which the base of the injury is covered by slough (yellow, tan, gray, green or brown) and/or eschar (tan, brown or black) in the wound bed.  Wound Description (Comments): Yellow area of slough  Present on Admission: Yes     Pressure Injury 11/23/20 Coccyx Left;Lower;Medial Unstageable - Full thickness tissue loss in which the base of the injury is covered by slough (yellow, tan, gray, green or brown) and/or eschar (tan, brown or black) in the wound bed. Thick yellow and bro (Active)  11/23/20 1836  Location: Coccyx  Location Orientation: Left;Lower;Medial  Staging: Unstageable - Full thickness tissue loss in which the base of the injury is covered by slough (yellow, tan, gray, green or brown) and/or eschar (tan, brown or black) in the wound bed.  Wound Description (Comments): Thick yellow and brown tissue. Unable to view healthy tissue.  Present on Admission: Yes    Physical Exam: Vital Signs Blood pressure 108/68, pulse 87, temperature 98.3 F (36.8 C), temperature source  Oral, resp. rate 16, height 5' 2.99" (1.6 m), weight 118 kg, SpO2 97 %. Gen: no distress, normal appearing HEENT: oral mucosa pink and moist, NCAT Cardio: Reg rate Chest: normal effort, normal rate of breathing Abd: soft, non-distended Ext: no edema  Psychiatric: appropriate but much more flat, depressed appearing;  Neurological: Ox3  alert;  dysarthric; L hemiparesis with spasticity- has ~ 2-3/5 strength in LUE and 5-/5 in RUE-no change in strength today- still cannot test legs- won't lay on backside.   Skin: Warm and dry.  Wounds with  dressing intact- sacral ulcer/coccyx- full thickness tissue loss-- less slough- a little pink seen in wound bed- cannot see bone Musc: No edema in extremities.  No tenderness in extremities. Dysarthria Motor: LUE 1+ to 2/5 prox to distal. MAS 3wrist/finger flexors and biceps.  LLE 3-/5 prox to 3/5 distally without  tone, stable   Assessment/Plan: 1. Functional deficits which require 3+ hours per day of interdisciplinary therapy in a comprehensive inpatient rehab setting. Physiatrist is providing close team supervision and 24 hour management of active medical problems listed below. Physiatrist and rehab team continue to assess barriers to discharge/monitor patient progress toward functional and medical goals  Care Tool:  Bathing    Body parts bathed by patient: Left arm, Chest, Abdomen, Front perineal area, Left upper leg, Right upper leg, Face   Body parts bathed by helper: Buttocks, Right lower leg, Left lower leg     Bathing assist Assist Level: Moderate Assistance - Patient 50 - 74%     Upper Body Dressing/Undressing Upper body dressing   What is the patient wearing?: Pull over shirt    Upper body assist Assist Level: Moderate Assistance - Patient 50 - 74%    Lower Body Dressing/Undressing Lower body dressing      What is the patient wearing?: Incontinence brief, Pants     Lower body assist Assist for lower body dressing: Maximal Assistance - Patient 25 - 49%     Toileting Toileting Toileting Activity did not occur (Clothing management and hygiene only): N/A (no void or bm)  Toileting assist Assist for toileting: Moderate Assistance - Patient 50 - 74%     Transfers Chair/bed transfer  Transfers assist     Chair/bed transfer assist level: Moderate Assistance - Patient 50 - 74%     Locomotion Ambulation   Ambulation assist   Ambulation activity did not occur: Safety/medical concerns          Walk 10 feet activity   Assist  Walk 10 feet activity  did not occur: Safety/medical concerns        Walk 50 feet activity   Assist Walk 50 feet with 2 turns activity did not occur: Safety/medical concerns         Walk 150 feet activity   Assist Walk 150 feet activity did not occur: Safety/medical concerns         Walk 10 feet on uneven surface  activity   Assist Walk 10 feet on uneven surfaces activity did not occur: Safety/medical concerns         Wheelchair     Assist Is the patient using a wheelchair?: Yes Type of Wheelchair: Manual    Wheelchair assist level: Supervision/Verbal cueing Max wheelchair distance: 40    Wheelchair 50 feet with 2 turns activity    Assist        Assist Level: Supervision/Verbal cueing   Wheelchair 150 feet activity     Assist  Assist Level: Maximal Assistance - Patient 25 - 49%   Blood pressure 108/68, pulse 87, temperature 98.3 F (36.8 C), temperature source Oral, resp. rate 16, height 5' 2.99" (1.6 m), weight 118 kg, SpO2 97 %.  Medical Problem List and Plan: 1.  Functional deficits secondary to debility related to new onset a fib and multiple medical          Continue CIR, will delay d/c to 12/2- Friday- team conference today to f/u on how he's doing.  2.  Antithrombotics: -DVT/anticoagulation:  Mechanical: Sequential compression devices, below knee Bilateral lower extremities  11/29- no Lovenox due to gastric ulcers/GIB             -antiplatelet therapy: N/a 3. Pain/spasticity: continue tylenol # 3 prn for pain.              -pt followed by [facility], has had botox in past. Looks as if these are needed again to wrist and finger flexors  11/15- will have pt see Dr Letta Pate for Botox after d/c.   11/23- not taking pain meds- per nursing- advised pt to ask for them- to get pain controlled- also gave him an additional oxy daily for hydrotherapy.   11/29- added MS Contin 15 mg BID for better pain control. Will monitor  11/30: able to tolerate 70 minutes OT  but refused PT due to pain, continue above regimen.  4. Mood: LCSW to follow for evaluation and support.              -antipsychotic agents: N/A 5. Neuropsych: This patient is capable of making decisions on his own behalf. 6. Skin/Wound Care: Routine pressure relief Mesures.              --added beneprotein.  7. Fluids/Electrolytes/Nutrition: strict I/Os             --Monitor renal status with serial checks.  8. A fib new onset: Monitor HR TID and for symptoms with increase in activity.             --continue Coreg, Cardizem and amiodarone             --Plans for repeat EGD in 8 weeks for clearance on addition of DOAC  Rate controlled 11/19 9. Gout flare: Continue Colchicine daily for now. Pt says it's improving.  11/16- Sx's resolved as of 11/15- off Prednisone 10. Acute on chronic AKI?: Due to NSAIDS for gout flare. Has resolved with SCr -1.3? Baseline .             -- 1.4 11/12--near baseline---continue to follow  11/17- IVFs 75cc/hour x 24 hours due to renal issues  Creatinine 1.29 on 1/18, labs ordered for Monday  11/21- Cr 1.57 again today- it appears to be higher than baseline- will recheck Wednesday  11/23- Will recheck BMP in AM  11/24- Cr 1.64- will give IVFs again and recheck in AM-   11/25- Baseline Cr 1.3-1.4, however still at 1.50 after IVFs0 BUN 21- not drinking enough- again asked pt to drink more.   11/28- Cr down to 1.40 and BUN 17- will con't off IVFs.   11/29- will recheck Thursday 11. T2DM with hyperglycemia: Hgb A1c-6.7. Monitor BS ac/hs and use SSI for elevated BS.  CBG (last 3)  Recent Labs    12/11/20 2141 12/12/20 0628 12/12/20 1136  GLUCAP 115* 109* 124*    --Added CM restrictions. Continue to hold metformin 11/28- CBGs look OK- con't regimen 12. Chronic diastolic CHF: Monitor I/O. Check  daily weights.  --Will monitor for signs of overload. On Lipitor.              --Ace on hold due to recent AKI.  13. Multiple gastric/colonic ulcers: On PPI BID.              --monitor for signs of bleeding.              Hemoglobin 10.1 on 11/14, labs ordered for Monday  Will con't to monitor  14.  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  Loose stools likely colchicine, gout flare responding to prednisone , will d/ced colchicine   11/15- doing better-con't to monitor 16. Pressure ulcers on backside  11/15- has unstageable full thickness on coccyx - will con't local wound care- if doesn't improve, will call WOC- seen by our WTA.   11/29-looking like less slough in pictures- no bone seen- Sacral xray looks OK- no osteomyelitis on xray 17. Hyperkalemia  11/24- K+ 5.1- will see if hemolysis- recheck in AM  11/25- K+ 5.0- will recheck Monday  11/28- K+ 4.9 - con't monitoring 18. Insomnia  Continue Trazodone 75 mg QHS for sleep.  89. Morbid obesity BMI 46.88: provide dietary education  11/28- weight down- BMI 46.09- con't counseling.  20. Depression 11/29- will start Celexa 20 mg daily 21. Decline in function/anterior lean  11/29- pt insists it's pain- will d/w team more at team conference     LOS: 19 days A FACE TO [place] 12/12/2020, 1:04 PM

## 2020-12-12 NOTE — Progress Notes (Signed)
Occupational Therapy Session Note  Patient Details  Name: [name] MRN: [id number] Date of Birth: 1965-09-14  Today's Date: 12/12/2020 OT Individual Time: 0700-0810 OT Individual Time Calculation (min): 70 min    Short Term Goals: Week 3:  OT Short Term Goal 1 (Week 3): STGs = LTGs  Skilled Therapeutic Interventions/Progress Updates:    Pt asleep in bed upon arrival but easily aroused. Pt had not eaten breakfast and stated his bed "had problems" during the night. Pt agreeable to getting OOB to get dressed and eat breakfast. Pt initiated sitting EOB but stated the needed to "sit up quickly" to limit pain in buttocks. Max A for supine>sit EOB. Attempted sit<>stand from EOB but pt unable to initiate power up. Pt transferred to w/c with [place]. UB dressing with supervision. LB dressing with max A sit<>stand in [place]. Max multimodal cues for standing in [place]. Pt with significant trunk flexion when standing. Pt's brief changed with pt in standing. Pt able to thread BLE into pants but dependent for pulling over hips when standing in [place]. Pt remained seated in TIS eating breakfast. All needs within reach.   Therapy Documentation Precautions:  Precautions Precautions: Fall Precaution Comments: H/o CVA with residual L hemiparesis; dysarthric bowel incontinence Required Braces or Orthoses:  (L resting hand spint at baseline (variable schedule)) Restrictions Weight Bearing Restrictions: No  Pain:  Pt reports 5/10 pain in buttocks with transitional movements; repositioned   Therapy/Group: Individual Therapy  [name] 12/12/2020, 8:18 AM

## 2020-12-12 NOTE — Progress Notes (Signed)
Physical Therapy Wound Treatment Patient Details  Name: [name] MRN: [id number] Date of Birth: Jul 09, 1965  Today's Date: 12/12/2020 PT Individual Time: 1050-1118 PT Individual Time Calculation (min): 28 min   Subjective  Patient and Family Stated Goals: I'll be happy if you can help me get home. Date of Onset:  (unknow) Prior Treatments:  (unknown)  Pain Score: Pain Score: 6   Wound Assessment  Pressure Injury 11/23/20 Coccyx Lower;Medial;Left Unstageable - Full thickness tissue loss in which the base of the injury is covered by slough (yellow, tan, gray, green or brown) and/or eschar (tan, brown or black) in the wound bed. Yellow area of sloug (Active)  Wound Image   12/10/20 1145  Dressing Type Barrier Film (skin prep);Foam - Lift dressing to assess site every shift;Gauze (Comment);Moist to dry;Normal saline moist dressing;Santyl 12/12/20 1233  Dressing Changed 12/12/20 1233  Dressing Change Frequency Twice a day 12/12/20 1233  State of Healing Early/partial granulation 12/12/20 1233  Site / Wound Assessment Clean;Red;Yellow 12/12/20 1233  % Wound base Red or Granulating 70% 12/12/20 1233  % Wound base Yellow/Fibrinous Exudate 25% 12/12/20 1233  % Wound base Black/Eschar 0% 12/12/20 1233  % Wound base Other/Granulation Tissue (Comment) 5% 12/12/20 1233  Peri-wound Assessment Intact 12/12/20 1233  Wound Length (cm) 5 cm 12/05/20 1800  Wound Width (cm) 3 cm 12/05/20 1800  Wound Depth (cm) 2 cm 12/05/20 1800  Wound Surface Area (cm^2) 15 cm^2 12/05/20 1800  Wound Volume (cm^3) 30 cm^3 12/05/20 1800  Margins Unattached edges (unapproximated) 12/12/20 1233  Drainage Amount Minimal 12/10/20 1145  Drainage Description Serous;Sanguineous 12/12/20 1233  Treatment Cleansed;Debridement (Selective);Hydrotherapy (Pulse lavage);Packing (Saline gauze) 12/12/20 1233      Hydrotherapy Pulsed lavage therapy - wound location: medial coccyx to R buttock Pulsed Lavage with Suction  (psi): 8 psi Pulsed Lavage with Suction - Normal Saline Used: 1000 mL Pulsed Lavage Tip: Tip with splash shield Selective Debridement Selective Debridement - Location: coccyx to r buttock Selective Debridement - Tools Used: Forceps;Scalpel Selective Debridement - Tissue Removed: yellow eschar   Wound Assessment and Plan  Wound Therapy - Assess/Plan/Recommendations Wound Therapy - Clinical Statement: pt's wound bed is progressing nicely.  Medial aspect of the would is a mix of slough and likely viable fascial tissue..  Wife instructed in dressing change and verbalized understanding  pt will benefit from continued Pulsed Lavage to soften eschar for selective debridement and decrease the bioburden in the wound. Wound Therapy - Functional Problem List: functional immobility Factors Delaying/Impairing Wound Healing: Altered sensation;Infection - systemic/local;Immobility Hydrotherapy Plan: Debridement;Dressing change;Patient/family education;Pulsatile lavage with suction Wound Therapy - Frequency: 6X / week Wound Therapy - Current Recommendations: PT Wound Therapy - Follow Up Recommendations: dressing changes by RN  Wound Therapy Goals- Improve the function of patient's integumentary system by progressing the wound(s) through the phases of wound healing (inflammation - proliferation - remodeling) by: Decrease Necrotic Tissue to: 20 % Decrease Necrotic Tissue - Progress: Progressing toward goal Increase Granulation Tissue to: 80% Increase Granulation Tissue - Progress: Progressing toward goal Goals/treatment plan/discharge plan were made with and agreed upon by patient/family: Yes Time For Goal Achievement: 7 days Wound Therapy - Potential for Goals: Good  Goals will be updated until maximal potential achieved or discharge criteria met.  Discharge criteria: when goals achieved, discharge from hospital, MD decision/surgical intervention, no progress towards goals, refusal/missing three  consecutive treatments without notification or medical reason.  GP     [name] 12/12/2020, 12:36 PM 12/12/2020  [name]  M., PT Acute Rehabilitation Services [phone]  (pager) [phone]  (office)

## 2020-12-13 DIAGNOSIS — R5381 Other malaise: Secondary | ICD-10-CM | POA: Diagnosis not present

## 2020-12-13 LAB — GLUCOSE, CAPILLARY
Glucose-Capillary: 129 mg/dL — ABNORMAL HIGH (ref 70–99)
Glucose-Capillary: 161 mg/dL — ABNORMAL HIGH (ref 70–99)
Glucose-Capillary: 142 mg/dL — ABNORMAL HIGH (ref 70–99)

## 2020-12-13 LAB — COMPREHENSIVE METABOLIC PANEL
ALT: 13 U/L (ref 0–44)
AST: 14 U/L — ABNORMAL LOW (ref 15–41)
Albumin: 2.8 g/dL — ABNORMAL LOW (ref 3.5–5.0)
Alkaline Phosphatase: 64 U/L (ref 38–126)
Anion gap: 9 (ref 5–15)
BUN: 19 mg/dL (ref 6–20)
CO2: 24 mmol/L (ref 22–32)
Calcium: 9 mg/dL (ref 8.9–10.3)
Chloride: 99 mmol/L (ref 98–111)
Creatinine, Ser: 1.37 mg/dL — ABNORMAL HIGH (ref 0.61–1.24)
GFR, Estimated: >60 mL/min (ref >60)
Glucose, Bld: 136 mg/dL — ABNORMAL HIGH (ref 70–99)
Potassium: 4.7 mmol/L (ref 3.5–5.1)
Sodium: 132 mmol/L — ABNORMAL LOW (ref 135–145)
Total Bilirubin: 0.6 mg/dL (ref 0.3–1.2)
Total Protein: 6.8 g/dL (ref 6.5–8.1)

## 2020-12-13 MED ORDER — CITALOPRAM HYDROBROMIDE 20 MG PO TABS
20.0000 mg | ORAL_TABLET | Freq: Every day | ORAL | 0 refills | Status: DC
  Filled 2020-12-13: qty 30, 30d supply, fill #0

## 2020-12-13 MED ORDER — AMIODARONE HCL 200 MG PO TABS
200.0000 mg | ORAL_TABLET | Freq: Two times a day (BID) | ORAL | 0 refills | Status: DC
  Filled 2020-12-13: qty 60, 30d supply, fill #0

## 2020-12-13 MED ORDER — MORPHINE SULFATE ER 15 MG PO TBCR
15.0000 mg | EXTENDED_RELEASE_TABLET | Freq: Two times a day (BID) | ORAL | 0 refills | Status: DC
  Filled 2020-12-13: qty 10, 5d supply, fill #0

## 2020-12-13 MED ORDER — COLLAGENASE 250 UNIT/GM EX OINT
TOPICAL_OINTMENT | Freq: Two times a day (BID) | CUTANEOUS | 6 refills | Status: DC
  Filled 2020-12-13: qty 30, 15d supply, fill #0

## 2020-12-13 MED ORDER — MORPHINE SULFATE ER 15 MG PO TBCR: 15.0000 mg | EXTENDED_RELEASE_TABLET | Freq: Two times a day (BID) | ORAL | 0 refills | Status: DC

## 2020-12-13 MED ORDER — OXYCODONE HCL 5 MG PO TABS
5.0000 mg | ORAL_TABLET | Freq: Four times a day (QID) | ORAL | 0 refills | Status: DC | PRN
  Filled 2020-12-13: qty 40, 5d supply, fill #0

## 2020-12-13 MED ORDER — PANTOPRAZOLE SODIUM 40 MG PO TBEC
40.0000 mg | DELAYED_RELEASE_TABLET | Freq: Two times a day (BID) | ORAL | 1 refills | Status: DC
  Filled 2020-12-13: qty 60, 30d supply, fill #0

## 2020-12-13 MED ORDER — OXYCODONE HCL 5 MG PO TABS: 5.0000 mg | ORAL_TABLET | Freq: Four times a day (QID) | ORAL | 0 refills | Status: DC | PRN

## 2020-12-13 MED ORDER — TRAZODONE HCL 150 MG PO TABS
75.0000 mg | ORAL_TABLET | Freq: Every day | ORAL | 0 refills | Status: DC
  Filled 2020-12-13: qty 30, 60d supply, fill #0

## 2020-12-13 MED ORDER — OXYBUTYNIN CHLORIDE 5 MG PO TABS
5.0000 mg | ORAL_TABLET | Freq: Two times a day (BID) | ORAL | 1 refills | Status: DC
  Filled 2020-12-13: qty 60, 30d supply, fill #0

## 2020-12-13 MED ORDER — DILTIAZEM HCL 30 MG PO TABS
30.0000 mg | ORAL_TABLET | Freq: Four times a day (QID) | ORAL | 0 refills | Status: DC
  Filled 2020-12-13: qty 120, 30d supply, fill #0

## 2020-12-13 NOTE — Progress Notes (Signed)
Patient ID: [name], male   DOB: 01/18/1965, 55 y.o.   MRN: [id number]  SW received updates from [employer] that low airloss mattress requires prior auth from insurance and [place] dept states it takes 3 days. SW requested alternating pressure pad until approval from ins for air mattress. SW also informed roho cushion now out of stock.   SW spoke with Sarah/[facility] [phone]) to request roho cushion. Reports will give patient a loaner cushion and will add the cushion to the w/c order.  SW met with pt and pt wife [name] in room to provide above updates. She is amenable to getting roho through [employer]. Will follow-up if there will be a cost for roho.  SW received updates from [employer] and she states unsure on how much or if this will be a cost for pt since waiting for insurance to process all information. Informed will share with her wife as well. SW spoke with pt wife [name] to inform on above.    Loralee Pacas, MSW, [place] Office: [phone] Cell: [phone] Fax: [phone]

## 2020-12-13 NOTE — Progress Notes (Signed)
Occupational Therapy Discharge Summary  Patient Details  Name: [name] MRN: [id number] Date of Birth: [birth year]  Patient has met 6 of 7 long term goals due to improved activity tolerance, improved balance, postural control, ability to compensate for deficits, and improved coordination.  Pt progress with BADLs and functional tranfsers was slow and inconsistent during this admission. LTG were downgraded from min A/supervision to mod A overall 2/2 pt's lack of progress and inconsistent performance. Squat pivot tranfsers with mod A to pt's Rt. Sit<>stand with mod A. LB dressing with max A. Standing balance with mod a for clothing management. Pt will have hospital bed at home. Pt's wife has been present and particiapted in therapy education session. Patient to discharge at overall  min to mod A   level.  Patient's care partner is independent to provide the necessary physical assistance at discharge.    Reasons goals not met: Pt still requires max A for LB dressing due to difficulty with donning socks and shoes. Pt's family will be able to provide this assistance.   Therapist error with Toileting goal - it was mod A and he did meet this goal.   Recommendation:  Patient will benefit from ongoing skilled OT services in home health setting to continue to advance functional skills in the area of BADL and Reduce care partner burden.  Equipment: Hospital bed  Reasons for discharge: treatment goals met and discharge from hospital  Patient/family agrees with progress made and goals achieved: Yes  OT Discharge Vision Baseline Vision/History: 1 Wears glasses Patient Visual Report: No change from baseline Vision Assessment?: No apparent visual deficits Perception  Perception: Within Functional Limits Praxis Praxis: Intact Cognition Overall Cognitive Status: Within Functional Limits for tasks assessed Arousal/Alertness: Awake/alert Orientation Level: Oriented to person;Oriented to  place;Oriented to situation;Disoriented to time Year: 2022 Month: November Day of Week: Incorrect Immediate Memory Recall: Sock;Blue;Bed Memory Recall Sock: With Cue Memory Recall Blue: With Cue Memory Recall Bed: With Cue Awareness: Appears intact Problem Solving: Appears intact Safety/Judgment: Appears intact Sensation Sensation Light Touch: Appears Intact Coordination Gross Motor Movements are Fluid and Coordinated: No Fine Motor Movements are Fluid and Coordinated: No Coordination and Movement Description: WFL on R; deficits at baseline on L 2/2 Hx of CVA Finger Nose Finger Test: Intact on R; impaired at baseline on L Motor  Motor Motor: Hemiplegia Motor - Skilled Clinical Observations: L hemi at baseline 2/2 Hx of CVA ~2018 Trunk/Postural Assessment  Cervical Assessment Cervical Assessment:  (forward head) Thoracic Assessment Thoracic Assessment:  (rounded shoulders) Lumbar Assessment Lumbar Assessment:  (posterior pelvic tilt) Postural Control Postural Control: Deficits on evaluation  Balance Dynamic Sitting Balance Sitting balance - Comments: supervision during funciional tasks Extremity/Trunk Assessment RUE Assessment RUE Assessment: Within Functional Limits LUE Assessment LUE Assessment: Exceptions to [facility] LUE Body System: Neuro Brunstrum levels for arm and hand: Arm;Hand Brunstrum level for arm: Stage III Synergy is performed voluntarily Brunstrum level for hand: Stage II Synergy is developing   [name] 12/13/2020, 8:09 AM

## 2020-12-13 NOTE — Progress Notes (Addendum)
PROGRESS NOTE   Subjective/Complaints:  Pt missed some therapy yesterday due to not getting pain meds before VAC change.  However pt reports pain doing much better with therapy since yesterday afternoon- Per OT< tolerating transfers much better.  Having BM right now on BSC.   Thinks h'es ready for d/c tomorrow.   ROS:   Pt denies SOB, abd pain, CP, N/V/C/D, and vision changes   Objective:   No results found. No results for input(s): WBC, HGB, HCT, PLT in the last 72 hours.    Recent Labs    12/13/20 0538  NA 132*  K 4.7  CL 99  CO2 24  GLUCOSE 136*  BUN 19  CREATININE 1.37*  CALCIUM 9.0    Intake/Output Summary (Last 24 hours) at 12/13/2020 0836 Last data filed at 12/12/2020 1347 Gross per 24 hour  Intake 20 ml  Output 375 ml  Net -355 ml     Pressure Injury 11/23/20 Coccyx Lower;Medial;Left Unstageable - Full thickness tissue loss in which the base of the injury is covered by slough (yellow, tan, gray, green or brown) and/or eschar (tan, brown or black) in the wound bed. Yellow area of sloug (Active)  11/23/20 1836  Location: Coccyx  Location Orientation: Lower;Medial;Left  Staging: Unstageable - Full thickness tissue loss in which the base of the injury is covered by slough (yellow, tan, gray, green or brown) and/or eschar (tan, brown or black) in the wound bed.  Wound Description (Comments): Yellow area of slough  Present on Admission: Yes     Pressure Injury 11/23/20 Coccyx Left;Lower;Medial Unstageable - Full thickness tissue loss in which the base of the injury is covered by slough (yellow, tan, gray, green or brown) and/or eschar (tan, brown or black) in the wound bed. Thick yellow and bro (Active)  11/23/20 1836  Location: Coccyx  Location Orientation: Left;Lower;Medial  Staging: Unstageable - Full thickness tissue loss in which the base of the injury is covered by slough (yellow, tan, gray, green  or brown) and/or eschar (tan, brown or black) in the wound bed.  Wound Description (Comments): Thick yellow and brown tissue. Unable to view healthy tissue.  Present on Admission: Yes    Physical Exam: Vital Signs Blood pressure 111/82, pulse 67, temperature 98.2 F (36.8 C), temperature source Oral, resp. rate 17, height 5' 2.99" (1.6 m), weight 118 kg, SpO2 95 %.    General: awake, alert, appropriate, sitting on BSC- with OTA in room; NAD HENT: conjugate gaze; oropharynx moist CV: regular rate; no JVD Pulmonary: CTA B/L; no W/R/R- good air movement GI: soft, NT, ND, (+)BS Psychiatric: appropriate- brighter affect Neurological: alert-   dysarthric- no change; L hemiparesis with spasticity- has ~ 2-3/5 strength in LUE and 5-/5 in RUE-no change in strength today- still cannot test legs- won't lay on backside.   Skin: Warm and dry.  Wounds with dressing intact- sacral ulcer/coccyx- full thickness tissue loss-- less slough- a little pink seen in wound bed- cannot see bone Musc: No edema in extremities.  No tenderness in extremities. Dysarthria Motor: LUE 1+ to 2/5 prox to distal. MAS 3wrist/finger flexors and biceps.  LLE 3-/5 prox to 3/5 distally without  tone, stable   Assessment/Plan: 1. Functional deficits which require 3+ hours per day of interdisciplinary therapy in a comprehensive inpatient rehab setting. Physiatrist is providing close team supervision and 24 hour management of active medical problems listed below. Physiatrist and rehab team continue to assess barriers to discharge/monitor patient progress toward functional and medical goals  Care Tool:  Bathing    Body parts bathed by patient: Right arm, Left arm, Chest, Abdomen, Front perineal area, Right upper leg, Left upper leg, Face, Right lower leg   Body parts bathed by helper: Buttocks, Left lower leg     Bathing assist Assist Level: Minimal Assistance - Patient > 75%     Upper Body Dressing/Undressing Upper  body dressing   What is the patient wearing?: Pull over shirt    Upper body assist Assist Level: Supervision/Verbal cueing    Lower Body Dressing/Undressing Lower body dressing      What is the patient wearing?: Incontinence brief, Pants     Lower body assist Assist for lower body dressing: Moderate Assistance - Patient 50 - 74%     Toileting Toileting Toileting Activity did not occur (Clothing management and hygiene only): N/A (no void or bm)  Toileting assist Assist for toileting: Total Assistance - Patient < 25%     Transfers Chair/bed transfer  Transfers assist     Chair/bed transfer assist level: Minimal Assistance - Patient > 75%     Locomotion Ambulation   Ambulation assist   Ambulation activity did not occur: Safety/medical concerns          Walk 10 feet activity   Assist  Walk 10 feet activity did not occur: Safety/medical concerns        Walk 50 feet activity   Assist Walk 50 feet with 2 turns activity did not occur: Safety/medical concerns         Walk 150 feet activity   Assist Walk 150 feet activity did not occur: Safety/medical concerns         Walk 10 feet on uneven surface  activity   Assist Walk 10 feet on uneven surfaces activity did not occur: Safety/medical concerns         Wheelchair     Assist Is the patient using a wheelchair?: Yes Type of Wheelchair: Manual    Wheelchair assist level: Supervision/Verbal cueing Max wheelchair distance: 100 ft    Wheelchair 50 feet with 2 turns activity    Assist        Assist Level: Supervision/Verbal cueing   Wheelchair 150 feet activity     Assist      Assist Level: Maximal Assistance - Patient 25 - 49%   Blood pressure 111/82, pulse 67, temperature 98.2 F (36.8 C), temperature source Oral, resp. rate 17, height 5' 2.99" (1.6 m), weight 118 kg, SpO2 95 %.  Medical Problem List and Plan: 1.  Functional deficits secondary to debility related  to new onset a fib and multiple medical          Continue CIR, will delay d/c to 12/2- Friday- team conference today to f/u on how he's doing.   Con't PT and OT- CIR_ d/c tomorrow 2.  Antithrombotics: -DVT/anticoagulation:  Mechanical: Sequential compression devices, below knee Bilateral lower extremities  11/29- no Lovenox due to gastric ulcers/GIB             -antiplatelet therapy: N/a 3. Pain/spasticity: continue tylenol # 3 prn for pain.              -  pt followed by [facility], has had botox in past. Looks as if these are needed again to wrist and finger flexors  11/15- will have pt see Dr Letta Pate for Botox after d/c.   11/23- not taking pain meds- per nursing- advised pt to ask for them- to get pain controlled- also gave him an additional oxy daily for hydrotherapy.   11/29- added MS Contin 15 mg BID for better pain control. Will monitor  11/30: able to tolerate 70 minutes OT but refused PT due to pain, continue above regimen.   12/1- pain doing better when gets meds for VAC change- reason for problems yesterday- con't regimen 4. Mood: LCSW to follow for evaluation and support.              -antipsychotic agents: N/A 5. Neuropsych: This patient is capable of making decisions on his own behalf. 6. Skin/Wound Care: Routine pressure relief Mesures.              --added beneprotein.  7. Fluids/Electrolytes/Nutrition: strict I/Os             --Monitor renal status with serial checks.  8. A fib new onset: Monitor HR TID and for symptoms with increase in activity.             --continue Coreg, Cardizem and amiodarone             --Plans for repeat EGD in 8 weeks for clearance on addition of DOAC  Rate controlled 11/19 9. Gout flare: Continue Colchicine daily for now. Pt says it's improving.  11/16- Sx's resolved as of 11/15- off Prednisone 10. Acute on chronic AKI?: Due to NSAIDS for gout flare. Has resolved with SCr -1.3? Baseline .             -- 1.4 11/12--near baseline---continue to  follow  11/17- IVFs 75cc/hour x 24 hours due to renal issues  Creatinine 1.29 on 1/18, labs ordered for Monday  11/21- Cr 1.57 again today- it appears to be higher than baseline- will recheck Wednesday  11/23- Will recheck BMP in AM  11/24- Cr 1.64- will give IVFs again and recheck in AM-   11/25- Baseline Cr 1.3-1.4, however still at 1.50 after IVFs0 BUN 21- not drinking enough- again asked pt to drink more.   11/28- Cr down to 1.40 and BUN 17- will con't off IVFs.   11/29- will recheck Thursday  12/1- Cr 1.37- doing well- con't to monitor 11. T2DM with hyperglycemia: Hgb A1c-6.7. Monitor BS ac/hs and use SSI for elevated BS.  CBG (last 3)  Recent Labs    12/12/20 1616 12/12/20 2056 12/13/20 0552  GLUCAP 95 117* 129*    --Added CM restrictions. Continue to hold metformin 12/1- CBG's looking great- con't regimen 12. Chronic diastolic CHF: Monitor I/O. Check daily weights.  --Will monitor for signs of overload. On Lipitor.              --Ace on hold due to recent AKI.  13. Multiple gastric/colonic ulcers: On PPI BID.             --monitor for signs of bleeding.              Hemoglobin 10.1 on 11/14, labs ordered for Monday  Will con't to monitor  14.  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  Loose stools likely colchicine, gout flare responding to prednisone , will d/ced colchicine   11/15- doing better-con't  to monitor 16. Pressure ulcers on backside  11/15- has unstageable full thickness on coccyx - will con't local wound care- if doesn't improve, will call WOC- seen by our WTA.   11/29-looking like less slough in pictures- no bone seen- Sacral xray looks OK- no osteomyelitis on xray  12/1- wound looking much cleaner with granulation- con't regimen 17. Hyperkalemia  11/24- K+ 5.1- will see if hemolysis- recheck in AM  11/25- K+ 5.0- will recheck Monday  11/28- K+ 4.9 - con't monitoring  12/1- K+ 4.7- con't to monitor 18.  Insomnia  Continue Trazodone 75 mg QHS for sleep.  15. Morbid obesity BMI 46.88: provide dietary education  11/28- weight down- BMI 46.09- con't counseling.  20. Depression 11/29- will start Celexa 20 mg daily 21. Decline in function/anterior lean  11/29- pt insists it's pain- will d/w team more at team conference  12/1- doing better- ready for d/c tomorrow    Pt needs a tilt and space manual w/c- has an unstageable- FULL thickness pressure ulcer on coccyx and is completely unable to do pressure relief in his current manual w/c which is 54 years old.  Also his current w/c needs new tires, rims, arm rests and new W/C cushion, as well as bending in the frame- he truly needs a new w/c in spite of it being 4 years since last w/c.   Also needs low air loss mattress- due to the large unstageable pressure ulcer on his coccyx. Will also need a ROHO w/c cushion due to the below ulcer.     LOS: 20 days A FACE TO FACE EVALUATION WAS PERFORMED  [name] 12/13/2020, 8:36 AM

## 2020-12-13 NOTE — Discharge Summary (Signed)
Physician Discharge Summary  Patient ID: [name] MRN: [id number] DOB/AGE: Apr 09, 1965 55 y.o.  Admit date: 11/23/2020 Discharge date: 12/14/2020  Discharge Diagnoses:  Principal Problem:   Debility Active Problems:   Hypertension   Hemiparesis and speech and language deficit as late effects of stroke (HCC)   Pressure injury of skin   Acute gastric ulcer   Controlled type 2 diabetes mellitus with hyperglycemia, without long-term current use of insulin ([place])   New onset atrial fibrillation ([place])   Sacral decubitus ulcer   Hyponatremia   Discharged Condition: good  Significant Diagnostic Studies: DG Sacrum/Coccyx  Result Date: 12/10/2020 CLINICAL DATA:  Sacral decubitus ulcer. EXAM: SACRUM AND COCCYX - 2+ VIEW COMPARISON:  08/07/2019.  Abdomen pelvis CT dated 11/14/2020. FINDINGS: Left hip prosthesis in satisfactory position and alignment. No bone destruction or periosteal reaction seen. No soft tissue gas. Mild lower lumbar spine degenerative changes. IMPRESSION: No radiographic evidence of osteomyelitis. Electronically Signed   By: Claudie Revering M.D.   On: 12/10/2020 12:51   MR BRAIN WO CONTRAST  Result Date: 12/11/2020 CLINICAL DATA:  Acute neurologic deficit EXAM: MRI HEAD WITHOUT CONTRAST TECHNIQUE: Multiplanar, multiecho pulse sequences of the brain and surrounding structures were obtained without intravenous contrast. COMPARISON:  12/12/2016 FINDINGS: Brain: No acute infarct, mass effect or extra-axial collection. Unchanged right para falcine calcified mass with underlying gliosis. 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 Moderate chronic white matter disease and mild volume loss. Scattered peripheral chronic microhemorrhages. The midline structures are normal. Vascular: Major flow voids are preserved. Skull and upper cervical spine: Normal calvarium and skull base. Visualized upper cervical spine and soft tissues are normal. Sinuses/Orbits:No paranasal  sinus fluid levels or advanced mucosal thickening. No mastoid or middle ear effusion. Normal orbits. IMPRESSION: 1. No acute intracranial abnormality. 2. 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 3. Moderate chronic white matter disease and mild volume loss. 4. Unchanged parafalcine calcified mass underlying gliosis. Electronically Signed   By: Ulyses Jarred M.D.   On: 12/11/2020 00:09    Labs:  Basic Metabolic Panel: BMP Latest Ref Rng & Units 12/13/2020 12/10/2020 12/07/2020  Glucose 70 - 99 mg/dL 136(H) 125(H) 133(H)  BUN 6 - 20 mg/dL 19 17 21(H)  Creatinine 0.61 - 1.24 mg/dL 1.37(H) 1.40(H) 1.50(H)  BUN/Creat Ratio 9 - 20 - - -  Sodium 135 - 145 mmol/L 132(L) 134(L) 135  Potassium 3.5 - 5.1 mmol/L 4.7 4.9 5.0  Chloride 98 - 111 mmol/L 99 100 105  CO2 22 - 32 mmol/L 24 27 23   Calcium 8.9 - 10.3 mg/dL 9.0 8.7(L) 8.6(L)     CBC: CBC Latest Ref Rng & Units 12/10/2020 12/03/2020 11/26/2020  WBC 4.0 - 10.5 K/uL 4.9 8.6 10.1  Hemoglobin 13.0 - 17.0 g/dL 9.4(L) 9.9(L) 10.1(L)  Hematocrit 39.0 - 52.0 % 29.5(L) 30.2(L) 30.9(L)  Platelets 150 - 400 K/uL 198 295 498(H)     CBG: Recent Labs  Lab 12/13/20 0552 12/13/20 1109 12/13/20 1611 12/14/20 0538 12/14/20 1226  GLUCAP 129* 161* 142* 131* 134*    Brief HPI:   [name] is a 55 y.o. male with history of HTN, CVA with left spatic  hemiplegia, T2DM, seizure do who was admitted on 11/14/20 after a fall while trying to transfer out of his chair.  He was found to be in A. fib with RVR and was started on Cardizem drip and converted to NSR.  2D echo done showing EF of 60 to 65% with mild concentric LVH  and mild biatrial dilatation.  He was started on IV fluids for AKI and Coreg was added to help with rate control.  He was started on IV heparin but developed acute blood loss anemia but EGD/colonoscopy was ordered but held due to nausea vomiting as well as recurrent PAF.    Once stabilized, he underwent upper GI with  colonoscopy by Dr. Lorenso Courier on 11/06. This revealed many linear esophageal ulcers without bleeding or stigmata of recent bleeding, erythematous mucosa in gastric antrum and multiple ulcers at hepatic flexure of ascending colon and cecum, 4 mm polyp and non bleeding hems. GI felt that anemia was related to ulcers and felt to be ischemic in nature as biopsies revealed chronic gastritis with mild inflammation. He was started on PPI BID and recommendations are for repeat EGD in 8 weeks to decide on initiation of DOAC. He did develop polyarticular pain due to gout and was started on colchicine. He was limited by hypotension with tachypnea as well as debility leading to decline in mobility and ability to carry out ADLs. CIR was recommended due to functional decline.     Hospital Course: [name] was admitted to rehab 11/23/2020 for inpatient therapies to consist of PT, ST and OT at least three hours five days a week. Past admission physiatrist, therapy team and rehab RN have worked together to provide customized collaborative inpatient rehab.  SCDs were used for DVT prophylaxis and serial check of CBC shows H&H to be relatively stable. His blood pressures were monitored on TID basis and Coreg was decreased to 12.5 mg twice daily to issues with hypotension.  Serial check of electrolytes showed acute on chronic renal failure has resolved with hydration with IVF and increase in fluid intake. Mild hyponatremia noted on recheck of labs and recommend repeat BMET in 1-2 weeks to monitor for stability. He continued to report right knee pain due to gout flare and was treated with 4 day course of prednisone. Colchicine was discontinue due to diarrhea side effects. Celexa was added to help manage depressed mood.   Sacral pain was noted due to wound decub and air mattress overlay was ordered for pressure-relief measures. He was found to have unstable full thickness tissue loss on coccyx covered with yellow and black  eschar in wound bed. WOC was consulted for input and patient was started on hydrotherapy 3 X wk to help with mechical debridement in addition to santyl with wet do dry dressing. ` He has had improvement in wound bed with decreased necrotic tissue to 20% and increase in granulation tissue by 80%.  Currently wound measures 5 cm x 2.5 cm x 1.5 cm deep and he is to continue Santyl with wet-to-dry dressing changes twice daily after discharge.  Sacral pain has been managed with addition of MS Contin, pressure relief measures including side-lying and boosting every 20 minutes when in chair oxycodone as needed for breakthrough pain.  He has been educated on importance of increasing protein intake to help promote wound healing.  He did not meet his goals due to decline in function and anterior lean due to sacral pain. X-ray of sacrum was negative for osteomyelitis. MRI brain was ordered for work up and showed bifrontal encephalomalacia and no acute abnormality. CTA brain/neck was negative for LVO and showed moderate intracranial atherosclerotic diease  large or proximal artery branch occlusion. His blood sugars have been controlled on SSI during his stay. Insomnia has been managed with addition of Trazodone. He did not meet goals  set due to decline in mobility and pain.  He will continue to receive follow up  [place], Leon and HHSN by [facility] after discharge.    Rehab course: During patient's stay in rehab weekly team conferences were held to monitor patient's progress, set goals and discuss barriers to discharge. At admission, patient required miin to max assist with ADL tasks and max assist with mobility.  He  has had improvement in activity tolerance, balance, postural control as well as ability to compensate for deficits.  His goals were down graded due to slow and inconsistent progress as well as ongoing issues with sacral pain. He requires max assist with able \\lb dressing. ADL tasks. He requires min to  mod assist with bed mobility and with squat pivot transfers. She requires Family education was  completed with wife.   Disposition: Home  Diet: Heart Healthy/ Carb modified.   Special Instructions: Santyl with damp to dry dressing to sacrum bid Protein supplements bid-tid between meals. . Recommend repeat CBC/BMET in 7-10 days to monitor for stability.   Discharge Instructions     AMB referral to wound care center   Complete by: As directed    Follow up/management of sacral decub   Ambulatory referral to Physical Medicine Rehab   Complete by: As directed    Hospital follow up      Allergies as of 12/13/2020   No Known Allergies      Medication List     STOP taking these medications    acetaminophen-codeine 300-30 MG tablet Commonly known as: TYLENOL #3   diltiazem 240 MG 24 hr capsule Commonly known as: Cartia XT   metFORMIN 500 MG tablet Commonly known as: GLUCOPHAGE   olmesartan 20 MG tablet Commonly known as: BENICAR       TAKE these medications    amiodarone 200 MG tablet Commonly known as: PACERONE Take 1 tablet (200 mg total) by mouth 2 (two) times daily.   atorvastatin 80 MG tablet Commonly known as: LIPITOR TAKE 1 TABLET BY MOUTH EVERY DAY   carvedilol 25 MG tablet Commonly known as: COREG Take 1 tablet (25 mg total) by mouth 2 (two) times daily with a meal.   citalopram 20 MG tablet Commonly known as: CELEXA Take 1 tablet (20 mg total) by mouth daily. Start taking on: December 14, 2020   collagenase ointment Commonly known as: SANTYL Apply topically 2 (two) times daily. To yellow slough and cover with damp to dry dressing.  Change dressing twice a day   diltiazem 30 MG tablet Commonly known as: CARDIZEM Take 1 tablet (30 mg total) by mouth every 6 (six) hours.   morphine 15 MG 12 hr tablet--10pills Commonly known as: MS CONTIN Take 1 tablet (15 mg total) by mouth every 12 (twelve) hours.   oxybutynin 5 MG tablet Commonly known as:  DITROPAN TAKE 1 TABLET BY MOUTH TWICE A DAY   oxyCODONE 5 MG immediate release tablet--Rx#40 pills Commonly known as: Oxy IR/ROXICODONE Take 1-2 tablets (5-10 mg total) by mouth every 6 (six) hours as needed for severe pain.   pantoprazole 40 MG tablet Commonly known as: PROTONIX Take 1 tablet (40 mg total) by mouth 2 (two) times daily.   traZODone 150 MG tablet Commonly known as: DESYREL Take 0.5 tablets (75 mg total) by mouth at bedtime.        Follow-up Information     Lovorn, Jinny Blossom, MD Follow up.   Specialty: Physical Medicine and Rehabilitation Why: office will  call you with follow up appointment Contact information: [address] [place] [address] [phone]         Sharyn Creamer, MD. Call on 12/31/2020.   Specialty: Gastroenterology Why: appointment at 8:30 am for follow up EGD/input on resuming blood thinner Contact information: [place] [address] [phone]         Dixie Dials, MD. Call today.   Specialty: Cardiology Why: for follow up appointment Contact information: [place] [address] [phone]         Ladell Pier, MD. Call today.   Specialty: Internal Medicine Why: for follow up appointment Contact information: [place] [address] [phone]         [place]             . Call on 12/17/2020.   Why: to confirm date and time of appointment Contact information: [place]. [place] [address] [phone]                Signed: Bary Leriche 12/17/2020, 3:41 PM

## 2020-12-13 NOTE — Progress Notes (Signed)
Occupational Therapy Session Note  Patient Details  Name: [name] MRN: [id number] Date of Birth: [birth year]  Today's Date: 12/13/2020 OT Individual Time: 0700-0810 OT Individual Time Calculation (min): 70 min    Short Term Goals: Week 3:  OT Short Term Goal 1 (Week 3): STGs = LTGs  Skilled Therapeutic Interventions/Progress Updates:    Pt resting in bed upon arrival. OT intervention with focus on bed mobility, sitting balance, functional transfers, BADL retraining, activity tolerance, and safety awareness. Supine>sit EOB with supervision using bed functions (pt will have hospital bed at home.) Pt requrested use of BSC and required max A for squat pivot transfer to [facility]. Pt dependent for toileting tasks. Transfer to TIS w/c with max A. Bathing seated in w/c with min A. UB dressing with supervision. LB dressing with max A. Pt requires more then a reasonable amount of time to complete all tasks. Pt states he is excited about going home tomorrow. Pt remained in TIS w/c with all needs within reach.   Therapy Documentation Precautions:  Precautions Precautions: Fall Precaution Comments: H/o CVA with residual L hemiparesis; dysarthric bowel incontinence Required Braces or Orthoses:  (L resting hand spint at baseline (variable schedule)) Restrictions Weight Bearing Restrictions: No    Pain:  Pt denies pain this morning   Therapy/Group: Individual Therapy  [name] 12/13/2020, 8:10 AM

## 2020-12-13 NOTE — Progress Notes (Signed)
Inpatient Rehabilitation Discharge Medication Review by a Pharmacist  A complete drug regimen review was completed for this patient to identify any potential clinically significant medication issues.  High Risk Drug Classes Is patient taking? Indication by Medication  Antipsychotic Yes Celexa - depression  Anticoagulant No   Antibiotic No   Opioid Yes  Oxycodone - pain  Antiplatelet No   Hypoglycemics/insulin No   Vasoactive Medication Yes Amiodarone/coreg/diltiazem- PAF, HTN  Chemotherapy No   Other Yes  Protonix - ulcers, santyl - wounds, atorvastatin - CVA     Type of Medication Issue Identified Description of Issue Recommendation(s)  Drug Interaction(s) (clinically significant)     Duplicate Therapy     Allergy     No Medication Administration End Date     Incorrect Dose     Additional Drug Therapy Needed     Significant med changes from prior encounter (inform family/care partners about these prior to discharge).    Other  Home medications stopped during admission dated 11/14/2020- amlodipine, diclofenac gel, metformin, olmesartan Restart home medications when and if clinically appropriate documenting rationale for discontinuing/changing medications prior to discharge from rehabilitative services     Clinically significant medication issues were identified that warrant physician communication and completion of prescribed/recommended actions by midnight of the next day:  No  Pharmacist comments: none  Time spent performing this drug regimen review (minutes):  [place], PharmD, [place], AAHIVP, CPP Infectious Disease Pharmacist 12/13/2020 10:14 AM

## 2020-12-13 NOTE — Progress Notes (Signed)
Physical Therapy Discharge Summary  Patient Details  Name: [name] MRN: [id number] Date of Birth: 10-May-1965  Today's Date: 12/13/2020   Patient has met 6 of 8 long term goals due to improved activity tolerance, increased strength, improved attention, and improved awareness.  Patient to discharge at a wheelchair level [place].   Patient's care partner is independent to provide the necessary physical assistance at discharge.  Reasons goals not met: Pt limited by pain due to sacral wound which was exacerbated during stay. Pt is limited in endurance, and currently at overall min/modA for bed mobility, and squat pivot transfers. However will receive [employer] w/c upon d/c which will allow TIS features but also allow pt to perform w/c mobility at household level. Pt's wife has been present and is able to provide necessary assist at home.   Recommendation:  Patient will benefit from ongoing skilled PT services in home health setting to continue to advance safe functional mobility, address ongoing impairments in endurance, strength, balance, safety, independence with functional mobility, and minimize fall risk.  Equipment: Glass blower/designer from [place]  Reasons for discharge: lack of progress toward goals and treatment goals met  Patient/family agrees with progress made and goals achieved: Yes  PT Discharge Precautions/Restrictions Precautions Precautions: Fall Precaution Comments: H/o CVA with residual L hemiparesis; dysarthric bowel incontinence Restrictions Weight Bearing Restrictions: No Pain Pain Assessment Pain Scale: 0-10 Pain Score: 5  Pain Location: Buttocks Pain Orientation: Posterior;Mid Pain Descriptors / Indicators: Aching Multiple Pain Sites: No Pain Interference Pain Interference Pain Effect on Sleep: 4. Almost constantly Pain Interference with Therapy Activities: 3. Frequently Pain Interference with Day-to-Day Activities: 4. Almost  constantly Vision/Perception  Perception Perception: Within Functional Limits Praxis Praxis: Intact  Cognition Overall Cognitive Status: Within Functional Limits for tasks assessed Arousal/Alertness: Awake/alert Orientation Level: Oriented X4 Year: 2022 Month: December Day of Week: Correct Sensation Sensation Light Touch: Appears Intact Coordination Gross Motor Movements are Fluid and Coordinated: No Fine Motor Movements are Fluid and Coordinated: No Motor  Motor Motor: Hemiplegia Motor - Skilled Clinical Observations: L hemi at baseline 2/2 Hx of CVA ~2018 Motor - Discharge Observations: no significnat change  Mobility Bed Mobility Bed Mobility: Sitting - Scoot to Edge of Bed;Sit to Sidelying Right Rolling Left: Independent with assistive device Left Sidelying to Sit: Independent with assistive device Sitting - Scoot to Edge of Bed: Contact Guard/Touching assist Sit to Sidelying Right: Moderate Assistance - Patient 50-74% Transfers Transfers: Pharmacist, hospital Pivot Transfers: Minimal Assistance - Patient > 75% Transfer (Assistive device): None Transfer via Lift Equipment: Probation officer Ambulation: No Gait Gait: No Pick up small object from the floor (from standing position) activity did not occur: Safety/medical Producer, television/film/video Mobility: Yes Wheelchair Assistance: Supervision/Verbal cueing Wheelchair Propulsion: Right upper extremity;Right lower extremity Wheelchair Parts Management: Needs assistance  Trunk/Postural Assessment  Cervical Assessment Cervical Assessment: Exceptions to [facility] Thoracic Assessment Thoracic Assessment: Exceptions to [facility] Lumbar Assessment Lumbar Assessment: Exceptions to [facility] Postural Control Postural Control: Deficits on evaluation  Balance Balance Balance Assessed: Yes Dynamic Sitting Balance Sitting balance - Comments: supervision during funciional tasks Extremity Assessment  RUE  Assessment RUE Assessment: Within Functional Limits LUE Assessment LUE Assessment: Exceptions to [facility] LUE Body System: Neuro Brunstrum levels for arm and hand: Arm;Hand Brunstrum level for arm: Stage III Synergy is performed voluntarily Brunstrum level for hand: Stage II Synergy is developing RLE Assessment RLE Assessment: Within Functional Limits Passive Range of Motion (PROM)  Comments: noted knee flexion contracture. General Strength Comments: grossly 4-/5 LLE Assessment LLE Assessment: Exceptions to [facility] General Strength Comments: grossly 3+/5    Rosita DeChalus Excell Seltzer, PT, DPT, CSRS 12/13/2020, 4:04 PM

## 2020-12-13 NOTE — Progress Notes (Addendum)
Physical Therapy Session Note  Patient Details  Name: [name] MRN: [id number] Date of Birth: November 22, 1965  Today's Date: 12/13/2020 PT Individual Time: 1105-1200 and 1435-1535  PT Individual Time Calculation (min): 55 min and 60 min  Short Term Goals: Week 3:  PT Short Term Goal 1 (Week 3): =LTG due to ELOS  Skilled Therapeutic Interventions/Progress Updates: Tx1: Pt presented in TIS agreeable to therapy. Pt states some pain in buttocks but not rated at this time. PTA discussed the w/c eval and pt agreeable to try LIberty Fit chair that we have in house. PTA was able to obtain chair and pt performed Stedy transfer with minA from current TIS to [place]. PTA demonstrated chair functionality and PTA placed pt in max tilt. Pt states feels comfortable and likes that his feet are able to touch the ground. Pt was able to propel 32f with supervision and navigate safely around obstacles. Pt surprised himself with distance he was able to propel in w/c. Pt transported back to room and pt was able to perform squat pivot transfer to bed with modA. Pt required modA for sit to supine on flat bed without bed rails. Pt immediately rolled to sidelying and PTA placed pillow between his knees. Pt left in bed at end of session with call bell within reach and current needs met.   Tx2: Pt presented in bed with wife present agreeable to therapy. Pt denies pain at rest, increases to 5/10 with mobility and sitting in [place]. Pt agreeable to try car transfer in car simulator as pt and wife aware that functional level has changed. Pt performed supine to sit with modA and PTA donned shoes while sitting EOB. Pt performed squat pivot transfer with [facility] to [facility]. PTA demonstrated to wife Liberty chair features including leg rests, tilt feature, and brakes. Pt then transported to ortho gym total A for energy conservation. Pt participated in car transfer with pt initially attempting stand pivot however unable to  appropriately weight shift therefore performed squat pivot with minA into vehicle and modA to return to w/c. Pt provided support encouraging pt that strength hasn't deteriorated much since eval with pt stating pain has been most limiting factor. Pt transported back to room and pt performed squat pivot transfer to bed with modA. Pt remained EOB while PTA removed shoes and then performed sit to supine with minA then immediately rolling to sidelying. Pt left in sidelying with pillow between legs, call bell within reach and wife present.      Therapy Documentation Precautions:  Precautions Precautions: Fall Precaution Comments: H/o CVA with residual L hemiparesis; dysarthric bowel incontinence Required Braces or Orthoses:  (L resting hand spint at baseline (variable schedule)) Restrictions Weight Bearing Restrictions: No   Therapy/Group: Individual Therapy  Rahm Minix Chanti Golubski, PTA  12/13/2020, 12:43 PM

## 2020-12-14 ENCOUNTER — Other Ambulatory Visit (HOSPITAL_COMMUNITY): Payer: Self-pay

## 2020-12-14 LAB — GLUCOSE, CAPILLARY
Glucose-Capillary: 131 mg/dL — ABNORMAL HIGH (ref 70–99)
Glucose-Capillary: 134 mg/dL — ABNORMAL HIGH (ref 70–99)

## 2020-12-14 MED ORDER — CARVEDILOL 12.5 MG PO TABS
12.5000 mg | ORAL_TABLET | Freq: Two times a day (BID) | ORAL | 0 refills | Status: DC
  Filled 2020-12-14: qty 60, 30d supply, fill #0

## 2020-12-14 MED ORDER — CARVEDILOL 12.5 MG PO TABS
12.5000 mg | ORAL_TABLET | Freq: Two times a day (BID) | ORAL | Status: DC
  Administered 2020-12-14: 12.5 mg via ORAL
  Filled 2020-12-14: qty 1

## 2020-12-14 NOTE — Progress Notes (Signed)
Patient ID: [name], male   DOB: 1965-10-05, 55 y.o.   MRN: [id number]  SW received updates from Christina/[facility] that hospital bed was not delivered as wife was not aware on when delivery would occur. Reports hospital bed can be delivered today between 9am-12pm.  *loaner w/c and roho cushion to be delivered to the home by [facility].  Wife called SW informing roho cushion not received. SW spoke with Sarah/[facility] and states just spoke with wife and roho cushion will come back to the home today.   11:55a-Wife reports hospital bed has not been delivered. SW waiting on follow-up from [employer] on when delivery will occur at [place] states she emailed logistics about concern.   *SW followed up with liaison  Zack with [facility] to inform on challenges. SW last spoke with wife who indicated she was informed on hospital bed will be delivered today.  Loralee Pacas, MSW, [place] Office: [phone] Cell: [phone] Fax: [phone]

## 2020-12-14 NOTE — Progress Notes (Signed)
Inpatient Rehabilitation Care Coordinator Discharge Note   Patient Details  Name: [name] MRN: [id number] Date of Birth: 26-Jul-1965   Discharge location: D/c to home with support  Length of Stay: 20 days  Discharge activity level: Mod A  Home/community participation: Limited  Patient response OF:BPZWCH Literacy - How often do you need to have someone help you when you read instructions, pamphlets, or other written material from your doctor or pharmacy?: Never  Patient response EN:IDPOEU Isolation - How often do you feel lonely or isolated from those around you?: Never  Services provided included: MD, RD, PT, OT, RN, CM, TR, Pharmacy, Neuropsych, SW  Financial Services:  Charity fundraiser Utilized: Other (Comment) Personnel officer Medicare (secondary)) BCBS  Choices offered to/list presented to: Yes  Follow-up services arranged:  Home Health, DME Home Health Agency: [facility] for HHPT/OT/SN (wound care)    DME : [facility] for hospital bed; [facility] for specialty w/c and roho cushion (provided loaner w/c until his chair is ready)    Patient response to transportation need: Is the patient able to respond to transportation needs?: Yes In the past 12 months, has lack of transportation kept you from medical appointments or from getting medications?: No In the past 12 months, has lack of transportation kept you from meetings, work, or from getting things needed for daily living?: No   Comments (or additional information):  Patient/Family verbalized understanding of follow-up arrangements:  Yes  Individual responsible for coordination of the follow-up plan: contact pt wife [name] [phone]  Confirmed correct DME delivered: Rana Snare 12/14/2020    Rana Snare

## 2020-12-14 NOTE — Progress Notes (Signed)
PROGRESS NOTE   Subjective/Complaints:  Pt reports had a yellow MEWS last afternoon-  Had a low BP of 68/48- but came up to 110/70s within a couple hours with drinking.   HR 60s-   ROS:   Pt denies SOB, abd pain, CP, N/V/C/D, and vision changes   Objective:   No results found. No results for input(s): WBC, HGB, HCT, PLT in the last 72 hours.    Recent Labs    12/13/20 0538  NA 132*  K 4.7  CL 99  CO2 24  GLUCOSE 136*  BUN 19  CREATININE 1.37*  CALCIUM 9.0    Intake/Output Summary (Last 24 hours) at 12/14/2020 0849 Last data filed at 12/14/2020 0700 Gross per 24 hour  Intake 120 ml  Output --  Net 120 ml     Pressure Injury 11/23/20 Coccyx Lower;Medial;Left Unstageable - Full thickness tissue loss in which the base of the injury is covered by slough (yellow, tan, gray, green or brown) and/or eschar (tan, brown or black) in the wound bed. Yellow area of sloug (Active)  11/23/20 1836  Location: Coccyx  Location Orientation: Lower;Medial;Left  Staging: Unstageable - Full thickness tissue loss in which the base of the injury is covered by slough (yellow, tan, gray, green or brown) and/or eschar (tan, brown or black) in the wound bed.  Wound Description (Comments): Yellow area of slough  Present on Admission: Yes     Pressure Injury 11/23/20 Coccyx Left;Lower;Medial Unstageable - Full thickness tissue loss in which the base of the injury is covered by slough (yellow, tan, gray, green or brown) and/or eschar (tan, brown or black) in the wound bed. Thick yellow and bro (Active)  11/23/20 1836  Location: Coccyx  Location Orientation: Left;Lower;Medial  Staging: Unstageable - Full thickness tissue loss in which the base of the injury is covered by slough (yellow, tan, gray, green or brown) and/or eschar (tan, brown or black) in the wound bed.  Wound Description (Comments): Thick yellow and brown tissue. Unable to  view healthy tissue.  Present on Admission: Yes    Physical Exam: Vital Signs Blood pressure 110/72, pulse 65, temperature 98.1 F (36.7 C), resp. rate 14, height 5' 2.99" (1.6 m), weight 113 kg, SpO2 97 %.    General: awake, alert, appropriate, laying on L side; NAD HENT: conjugate gaze; oropharynx moist CV: regular rate- borderline low; ; no JVD Pulmonary: CTA B/L; no W/R/R- good air movement GI: soft, NT, ND, (+)BS Psychiatric: appropriate- Neurological: Ox3; dysarthric Skin: Warm and dry.  Wounds with dressing intact- sacral ulcer/coccyx- full thickness tissue loss-- less slough- a little pink seen in wound bed- cannot see bone Musc: No edema in extremities.  No tenderness in extremities. Dysarthria Motor: LUE 1+ to 2/5 prox to distal. MAS 3wrist/finger flexors and biceps.  LLE 3-/5 prox to 3/5 distally without  tone, stable   Assessment/Plan: 1. Functional deficits which require 3+ hours per day of interdisciplinary therapy in a comprehensive inpatient rehab setting. Physiatrist is providing close team supervision and 24 hour management of active medical problems listed below. Physiatrist and rehab team continue to assess barriers to discharge/monitor patient progress toward functional and medical goals  Care Tool:  Bathing    Body parts bathed by patient: Right arm, Left arm, Chest, Abdomen, Front perineal area, Right upper leg, Left upper leg, Face, Right lower leg   Body parts bathed by helper: Buttocks, Left lower leg     Bathing assist Assist Level: Minimal Assistance - Patient > 75%     Upper Body Dressing/Undressing Upper body dressing   What is the patient wearing?: Pull over shirt    Upper body assist Assist Level: Supervision/Verbal cueing    Lower Body Dressing/Undressing Lower body dressing      What is the patient wearing?: Incontinence brief, Pants     Lower body assist Assist for lower body dressing: Moderate Assistance - Patient 50 - 74%      Toileting Toileting Toileting Activity did not occur (Clothing management and hygiene only): N/A (no void or bm)  Toileting assist Assist for toileting: Total Assistance - Patient < 25%     Transfers Chair/bed transfer  Transfers assist     Chair/bed transfer assist level: Minimal Assistance - Patient > 75%     Locomotion Ambulation   Ambulation assist   Ambulation activity did not occur: Safety/medical concerns          Walk 10 feet activity   Assist  Walk 10 feet activity did not occur: Safety/medical concerns        Walk 50 feet activity   Assist Walk 50 feet with 2 turns activity did not occur: Safety/medical concerns         Walk 150 feet activity   Assist Walk 150 feet activity did not occur: Safety/medical concerns         Walk 10 feet on uneven surface  activity   Assist Walk 10 feet on uneven surfaces activity did not occur: Safety/medical concerns         Wheelchair     Assist Is the patient using a wheelchair?: Yes Type of Wheelchair: Manual    Wheelchair assist level: Supervision/Verbal cueing Max wheelchair distance: 34ft    Wheelchair 50 feet with 2 turns activity    Assist        Assist Level: Supervision/Verbal cueing   Wheelchair 150 feet activity     Assist      Assist Level: Dependent - Patient 0%   Blood pressure 110/72, pulse 65, temperature 98.1 F (36.7 C), resp. rate 14, height 5' 2.99" (1.6 m), weight 113 kg, SpO2 97 %.  Medical Problem List and Plan: 1.  Functional deficits secondary to debility related to new onset a fib and multiple medical          Continue CIR, will delay d/c to 12/2- Friday- team conference today to f/u on how he's doing.   D/c today- pt doesn't want to hold d/c for low BP- changed Coreg to 12.5 mg BID- but can also decrease Diltiazem if need be.  2.  Antithrombotics: -DVT/anticoagulation:  Mechanical: Sequential compression devices, below knee Bilateral lower  extremities  11/29- no Lovenox due to gastric ulcers/GIB             -antiplatelet therapy: N/a 3. Pain/spasticity: continue tylenol # 3 prn for pain.              -pt followed by [facility], has had botox in past. Looks as if these are needed again to wrist and finger flexors  11/15- will have pt see Dr Letta Pate for Botox after d/c.   11/23- not taking pain meds- per nursing- advised  pt to ask for them- to get pain controlled- also gave him an additional oxy daily for hydrotherapy.   11/29- added MS Contin 15 mg BID for better pain control. Will monitor  11/30: able to tolerate 70 minutes OT but refused PT due to pain, continue above regimen.   12/1- pain doing better when gets meds for VAC change- reason for problems yesterday- con't regimen  12/2- pt will go home on 5 days of pain meds- will refill after 5 days.  4. Mood: LCSW to follow for evaluation and support.              -antipsychotic agents: N/A 5. Neuropsych: This patient is capable of making decisions on his own behalf. 6. Skin/Wound Care: Routine pressure relief Mesures.              --added beneprotein.  7. Fluids/Electrolytes/Nutrition: strict I/Os             --Monitor renal status with serial checks.  8. A fib new onset: Monitor HR TID and for symptoms with increase in activity.             --continue Coreg, Cardizem and amiodarone             --Plans for repeat EGD in 8 weeks for clearance on addition of DOAC  Rate controlled 11/19 9. Gout flare: Continue Colchicine daily for now. Pt says it's improving.  11/16- Sx's resolved as of 11/15- off Prednisone 10. Acute on chronic AKI?: Due to NSAIDS for gout flare. Has resolved with SCr -1.3? Baseline .             -- 1.4 11/12--near baseline---continue to follow  11/17- IVFs 75cc/hour x 24 hours due to renal issues  Creatinine 1.29 on 1/18, labs ordered for Monday  11/21- Cr 1.57 again today- it appears to be higher than baseline- will recheck Wednesday  11/23- Will recheck BMP  in AM  11/24- Cr 1.64- will give IVFs again and recheck in AM-   11/25- Baseline Cr 1.3-1.4, however still at 1.50 after IVFs0 BUN 21- not drinking enough- again asked pt to drink more.   11/28- Cr down to 1.40 and BUN 17- will con't off IVFs.   11/29- will recheck Thursday  12/1- Cr 1.37- doing well- con't to monitor 11. T2DM with hyperglycemia: Hgb A1c-6.7. Monitor BS ac/hs and use SSI for elevated BS.  CBG (last 3)  Recent Labs    12/13/20 1109 12/13/20 1611 12/14/20 0538  GLUCAP 161* 142* 131*    --Added CM restrictions. Continue to hold metformin 12/2- shouldn't restart Metformin with Cr issues.  12. Chronic diastolic CHF: Monitor I/O. Check daily weights.  --Will monitor for signs of overload. On Lipitor.              --Ace on hold due to recent AKI.  13. Multiple gastric/colonic ulcers: On PPI BID.             --monitor for signs of bleeding.              Hemoglobin 10.1 on 11/14, labs ordered for Monday  Will con't to monitor  14.  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  Loose stools likely colchicine, gout flare responding to prednisone , will d/ced colchicine   11/15- doing better-con't to monitor 16. Pressure ulcers on backside  11/15- has unstageable full thickness on coccyx - will con't local wound care- if doesn't  improve, will call WOC- seen by our WTA.   11/29-looking like less slough in pictures- no bone seen- Sacral xray looks OK- no osteomyelitis on xray  12/1- wound looking much cleaner with granulation- con't regimen 17. Hyperkalemia  11/24- K+ 5.1- will see if hemolysis- recheck in AM  11/25- K+ 5.0- will recheck Monday  11/28- K+ 4.9 - con't monitoring  12/1- K+ 4.7- con't to monitor 18. Insomnia  Continue Trazodone 75 mg QHS for sleep.  75. Morbid obesity BMI 46.88: provide dietary education  11/28- weight down- BMI 46.09- con't counseling.  20. Depression 11/29- will start Celexa 20 mg  daily 21. Decline in function/anterior lean  11/29- pt insists it's pain- will d/w team more at team conference  12/1- doing better- ready for d/c tomorrow 22. Low BP in setting of HTN  12/2- will decrease Coreg to 12.5 mg BID since pulse running 60s- also on exam- but if need to decrease Diltiazem, we can for d/c. - Needs f/u with PCP asap.    Pt needs a tilt and space manual w/c- has an unstageable- FULL thickness pressure ulcer on coccyx and is completely unable to do pressure relief in his current manual w/c which is 54 years old.  Also his current w/c needs new tires, rims, arm rests and new W/C cushion, as well as bending in the frame- he truly needs a new w/c in spite of it being 4 years since last w/c.   Also needs low air loss mattress- due to the large unstageable pressure ulcer on his coccyx. Will also need a ROHO w/c cushion due to the below ulcer.     LOS: 21 days A FACE TO FACE EVALUATION WAS PERFORMED  Alanmichael Barmore 12/14/2020, 8:49 AM

## 2020-12-14 NOTE — Progress Notes (Signed)
Physical Therapy Wound Treatment Patient Details  Name: [name] MRN: [id number] Date of Birth: 10/20/1965  Today's Date: 12/14/2020 PT Individual Time: 8264-1583 PT Individual Time Calculation (min): 24 min   Subjective  Subjective: Pt reports he is going home today Patient and Family Stated Goals: go home Date of Onset:  (unknown) Prior Treatments:  (unknown)  Pain Score:    Wound Assessment  Pressure Injury 11/23/20 Coccyx Lower;Medial;Left Unstageable - Full thickness tissue loss in which the base of the injury is covered by slough (yellow, tan, gray, green or brown) and/or eschar (tan, brown or black) in the wound bed. Yellow area of sloug (Active)  Wound Image   12/10/20 1145  Dressing Type Barrier Film (skin prep);Gauze (Comment);Moist to dry 12/13/20 0100  Dressing Clean;Intact;Changed 12/13/20 0100  Dressing Change Frequency Twice a day 12/12/20 1400  State of Healing Early/partial granulation 12/13/20 0100  Site / Wound Assessment Clean;Pink;Red;Yellow 12/14/20 1306  % Wound base Red or Granulating 75% 12/14/20 1306  % Wound base Yellow/Fibrinous Exudate 20% 12/14/20 1306  % Wound base Black/Eschar 0% 12/14/20 1306  % Wound base Other/Granulation Tissue (Comment) 5% 12/14/20 1306  Peri-wound Assessment Intact 12/12/20 1400  Wound Length (cm) 5 cm 12/12/20 1400  Wound Width (cm) 2.5 cm 12/12/20 1400  Wound Depth (cm) 1.5 cm 12/12/20 1400  Wound Surface Area (cm^2) 12.5 cm^2 12/12/20 1400  Wound Volume (cm^3) 18.75 cm^3 12/12/20 1400  Margins Unattached edges (unapproximated) 12/14/20 1306  Drainage Amount None 12/14/20 1306  Drainage Description Serous 12/14/20 1306  Treatment Debridement (Selective);Hydrotherapy (Pulse lavage);Packing (Saline gauze) 12/14/20 1306      Hydrotherapy Pulsed lavage therapy - wound location: medial coccyx to R buttock Pulsed Lavage with Suction (psi): 8 psi Pulsed Lavage with Suction - Normal Saline Used: 1000 mL Pulsed Lavage  Tip: Tip with splash shield Selective Debridement Selective Debridement - Location: coccyx to r buttock Selective Debridement - Tools Used: Forceps;Scalpel Selective Debridement - Tissue Removed: yellow slough   Wound Assessment and Plan  Wound Therapy - Assess/Plan/Recommendations Wound Therapy - Clinical Statement: continue with progress of removing nonviable tissue with pulse lavage and selective debridement. Pt to return home today with follow up with [facility] and wound center Wound Therapy - Functional Problem List: functional immobility Factors Delaying/Impairing Wound Healing: Altered sensation;Infection - systemic/local;Immobility Hydrotherapy Plan: Debridement;Dressing change;Patient/family education;Pulsatile lavage with suction Wound Therapy - Frequency: 3X / week Wound Therapy - Current Recommendations: PT Wound Therapy - Follow Up Recommendations: dressing changes by RN  Wound Therapy Goals- Improve the function of patient's integumentary system by progressing the wound(s) through the phases of wound healing (inflammation - proliferation - remodeling) by: Decrease Necrotic Tissue to: 20 % Decrease Necrotic Tissue - Progress: Met Increase Granulation Tissue to: 80% Increase Granulation Tissue - Progress: Partly met  Goals will be updated until maximal potential achieved or discharge criteria met.  Discharge criteria: when goals achieved, discharge from hospital, MD decision/surgical intervention, no progress towards goals, refusal/missing three consecutive treatments without notification or medical reason.  GP     Shary Decamp [place] 12/14/2020, 1:12 PM [place] Pager [phone] Office [phone]

## 2020-12-17 ENCOUNTER — Telehealth: Payer: Self-pay

## 2020-12-17 ENCOUNTER — Telehealth: Payer: Self-pay | Admitting: Physical Medicine and Rehabilitation

## 2020-12-17 DIAGNOSIS — L89159 Pressure ulcer of sacral region, unspecified stage: Secondary | ICD-10-CM

## 2020-12-17 DIAGNOSIS — I69328 Other speech and language deficits following cerebral infarction: Secondary | ICD-10-CM | POA: Diagnosis not present

## 2020-12-17 DIAGNOSIS — Z96642 Presence of left artificial hip joint: Secondary | ICD-10-CM | POA: Diagnosis not present

## 2020-12-17 DIAGNOSIS — Z9049 Acquired absence of other specified parts of digestive tract: Secondary | ICD-10-CM | POA: Diagnosis not present

## 2020-12-17 DIAGNOSIS — Z6834 Body mass index (BMI) 34.0-34.9, adult: Secondary | ICD-10-CM | POA: Diagnosis not present

## 2020-12-17 DIAGNOSIS — M109 Gout, unspecified: Secondary | ICD-10-CM | POA: Diagnosis not present

## 2020-12-17 DIAGNOSIS — E871 Hypo-osmolality and hyponatremia: Secondary | ICD-10-CM

## 2020-12-17 DIAGNOSIS — G40909 Epilepsy, unspecified, not intractable, without status epilepticus: Secondary | ICD-10-CM | POA: Diagnosis not present

## 2020-12-17 DIAGNOSIS — E785 Hyperlipidemia, unspecified: Secondary | ICD-10-CM | POA: Diagnosis not present

## 2020-12-17 DIAGNOSIS — L89894 Pressure ulcer of other site, stage 4: Secondary | ICD-10-CM | POA: Diagnosis not present

## 2020-12-17 DIAGNOSIS — E119 Type 2 diabetes mellitus without complications: Secondary | ICD-10-CM | POA: Diagnosis not present

## 2020-12-17 DIAGNOSIS — Z85048 Personal history of other malignant neoplasm of rectum, rectosigmoid junction, and anus: Secondary | ICD-10-CM | POA: Diagnosis not present

## 2020-12-17 DIAGNOSIS — I69322 Dysarthria following cerebral infarction: Secondary | ICD-10-CM | POA: Diagnosis not present

## 2020-12-17 DIAGNOSIS — D649 Anemia, unspecified: Secondary | ICD-10-CM | POA: Diagnosis not present

## 2020-12-17 DIAGNOSIS — I69354 Hemiplegia and hemiparesis following cerebral infarction affecting left non-dominant side: Secondary | ICD-10-CM | POA: Diagnosis not present

## 2020-12-17 DIAGNOSIS — I4891 Unspecified atrial fibrillation: Secondary | ICD-10-CM | POA: Diagnosis not present

## 2020-12-17 DIAGNOSIS — E669 Obesity, unspecified: Secondary | ICD-10-CM | POA: Diagnosis not present

## 2020-12-17 DIAGNOSIS — I10 Essential (primary) hypertension: Secondary | ICD-10-CM | POA: Diagnosis not present

## 2020-12-17 NOTE — Telephone Encounter (Signed)
Transition Care Management Follow-up Telephone Call  Call completed with patient's wife, [name]. Date of discharge and from where: 12/14/2020, [facility] How have you been since you were released from the hospital? She said he is doing okay. He doesn't want to sit up for any extended period of time due to pain from his sacral ulcer  Any questions or concerns? Yes - she is concerned about his wound and is wondering how long it will take to heal. Her husband has an appointment at the wound care center; but not until 01/30/2020.  [name] said that Dr Dagoberto Ligas is managing patient's pain medications.   Items Reviewed: Did the pt receive and understand the discharge instructions provided? Yes  Medications obtained and verified? Yes  - she said she has all medications and will review the meds with the home health nurse tomorrow.  Other? No  Any new allergies since your discharge? No  Dietary orders reviewed? Yes. [name] said he is eating well. Do you have support at home? Yes , [name] is the primary caregiver. She is on FMLA from her job.  Home Care and Equipment/Supplies: Were home health services ordered? yes If so, what is the name of the agency? [facility]  Has the agency set up a time to come to the patient's home? Yes. The PT came out today and the RN is coming tomorrow.  She is anxious to review the wound care procedure with the nurse to make sure she is doing it correctly. She said she was shown how to do the wound care when he was in the hospital and she has enough supplies for now.  Were any new equipment or medical supplies ordered?  Yes: hospital bed and wheelchair with cushion.  What is the name of the medical supply agency? Hospital bed - [place]; Wheelchair and cushion- [facility]  Were you able to get the supplies/equipment? yes Do you have any questions related to the use of the equipment or supplies? Yes: She explained that there is piece  missing  from one of the bed side rails.  She plans to contact [facility] about the issue tomorrow.     Functional Questionnaire: (I = Independent and D = Dependent) ADLs: dependent. She said that he needs a lot of assistance with transfers.    Follow up appointments reviewed:  PCP Hospital f/u appt confirmed? Yes  Scheduled to see Dr Wynetta Emery 12/25/2020 - My Chart visit  [facility] f/u appt confirmed? Yes  Needs to schedule appointment with Dr Doylene Canard as well as PMR.  He has appointment with GI 12/31/2020. She needs to reschedule his podiatry appointment that was scheduled for today. Are transportation arrangements needed? No  If their condition worsens, is the pt aware to call PCP or go to the Emergency Dept.? Yes Was the patient provided with contact information for the PCP's office or ED? Yes Was to pt encouraged to call back with questions or concerns? Yes

## 2020-12-17 NOTE — Telephone Encounter (Signed)
Reesa Chew, PA sent a message to me that patient is needing a refill on MS Contin and Oxycodone.  He doesn't have an appointment until March to see Dr. Dagoberto Ligas, do we need to set patient up an appointment to see Zella Ball?  Please advise.

## 2020-12-18 ENCOUNTER — Telehealth: Payer: Self-pay | Admitting: Physical Medicine and Rehabilitation

## 2020-12-18 DIAGNOSIS — I69328 Other speech and language deficits following cerebral infarction: Secondary | ICD-10-CM | POA: Diagnosis not present

## 2020-12-18 DIAGNOSIS — E669 Obesity, unspecified: Secondary | ICD-10-CM | POA: Diagnosis not present

## 2020-12-18 DIAGNOSIS — I10 Essential (primary) hypertension: Secondary | ICD-10-CM | POA: Diagnosis not present

## 2020-12-18 DIAGNOSIS — I69354 Hemiplegia and hemiparesis following cerebral infarction affecting left non-dominant side: Secondary | ICD-10-CM | POA: Diagnosis not present

## 2020-12-18 DIAGNOSIS — Z9049 Acquired absence of other specified parts of digestive tract: Secondary | ICD-10-CM | POA: Diagnosis not present

## 2020-12-18 DIAGNOSIS — I4891 Unspecified atrial fibrillation: Secondary | ICD-10-CM | POA: Diagnosis not present

## 2020-12-18 DIAGNOSIS — Z6834 Body mass index (BMI) 34.0-34.9, adult: Secondary | ICD-10-CM | POA: Diagnosis not present

## 2020-12-18 DIAGNOSIS — G40909 Epilepsy, unspecified, not intractable, without status epilepticus: Secondary | ICD-10-CM | POA: Diagnosis not present

## 2020-12-18 DIAGNOSIS — E785 Hyperlipidemia, unspecified: Secondary | ICD-10-CM | POA: Diagnosis not present

## 2020-12-18 DIAGNOSIS — E119 Type 2 diabetes mellitus without complications: Secondary | ICD-10-CM | POA: Diagnosis not present

## 2020-12-18 DIAGNOSIS — I69322 Dysarthria following cerebral infarction: Secondary | ICD-10-CM | POA: Diagnosis not present

## 2020-12-18 DIAGNOSIS — L89894 Pressure ulcer of other site, stage 4: Secondary | ICD-10-CM | POA: Diagnosis not present

## 2020-12-18 DIAGNOSIS — M109 Gout, unspecified: Secondary | ICD-10-CM | POA: Diagnosis not present

## 2020-12-18 DIAGNOSIS — Z96642 Presence of left artificial hip joint: Secondary | ICD-10-CM | POA: Diagnosis not present

## 2020-12-18 DIAGNOSIS — Z85048 Personal history of other malignant neoplasm of rectum, rectosigmoid junction, and anus: Secondary | ICD-10-CM | POA: Diagnosis not present

## 2020-12-18 DIAGNOSIS — D649 Anemia, unspecified: Secondary | ICD-10-CM | POA: Diagnosis not present

## 2020-12-18 MED ORDER — MORPHINE SULFATE ER 15 MG PO TBCR: 15.0000 mg | EXTENDED_RELEASE_TABLET | Freq: Two times a day (BID) | ORAL | 0 refills | Status: DC

## 2020-12-18 MED ORDER — OXYCODONE HCL 5 MG PO TABS
5.0000 mg | ORAL_TABLET | Freq: Four times a day (QID) | ORAL | 0 refills | Status: DC | PRN
Start: 2020-12-18 — End: 2021-11-18

## 2020-12-18 NOTE — Telephone Encounter (Signed)
Per Dr. Dagoberto Ligas patient will need a follow up with Zella Ball next month to get medication refills.  She has filled them today, so please if he calls back get him in the beginning of next month with [place].  Patient doesn't have an appt with Dr. Dagoberto Ligas until March.  Thank you.

## 2020-12-19 ENCOUNTER — Encounter: Payer: Self-pay | Admitting: *Deleted

## 2020-12-19 ENCOUNTER — Telehealth: Payer: Self-pay | Admitting: *Deleted

## 2020-12-19 ENCOUNTER — Telehealth: Payer: Self-pay | Admitting: Internal Medicine

## 2020-12-19 DIAGNOSIS — Z6834 Body mass index (BMI) 34.0-34.9, adult: Secondary | ICD-10-CM | POA: Diagnosis not present

## 2020-12-19 DIAGNOSIS — M109 Gout, unspecified: Secondary | ICD-10-CM | POA: Diagnosis not present

## 2020-12-19 DIAGNOSIS — L89894 Pressure ulcer of other site, stage 4: Secondary | ICD-10-CM | POA: Diagnosis not present

## 2020-12-19 DIAGNOSIS — I10 Essential (primary) hypertension: Secondary | ICD-10-CM | POA: Diagnosis not present

## 2020-12-19 DIAGNOSIS — Z9049 Acquired absence of other specified parts of digestive tract: Secondary | ICD-10-CM | POA: Diagnosis not present

## 2020-12-19 DIAGNOSIS — I69322 Dysarthria following cerebral infarction: Secondary | ICD-10-CM | POA: Diagnosis not present

## 2020-12-19 DIAGNOSIS — I69354 Hemiplegia and hemiparesis following cerebral infarction affecting left non-dominant side: Secondary | ICD-10-CM | POA: Diagnosis not present

## 2020-12-19 DIAGNOSIS — L89300 Pressure ulcer of unspecified buttock, unstageable: Secondary | ICD-10-CM | POA: Diagnosis not present

## 2020-12-19 DIAGNOSIS — E785 Hyperlipidemia, unspecified: Secondary | ICD-10-CM | POA: Diagnosis not present

## 2020-12-19 DIAGNOSIS — Z85048 Personal history of other malignant neoplasm of rectum, rectosigmoid junction, and anus: Secondary | ICD-10-CM | POA: Diagnosis not present

## 2020-12-19 DIAGNOSIS — I4891 Unspecified atrial fibrillation: Secondary | ICD-10-CM | POA: Diagnosis not present

## 2020-12-19 DIAGNOSIS — G40909 Epilepsy, unspecified, not intractable, without status epilepticus: Secondary | ICD-10-CM | POA: Diagnosis not present

## 2020-12-19 DIAGNOSIS — D649 Anemia, unspecified: Secondary | ICD-10-CM | POA: Diagnosis not present

## 2020-12-19 DIAGNOSIS — Z96642 Presence of left artificial hip joint: Secondary | ICD-10-CM | POA: Diagnosis not present

## 2020-12-19 DIAGNOSIS — I69328 Other speech and language deficits following cerebral infarction: Secondary | ICD-10-CM | POA: Diagnosis not present

## 2020-12-19 DIAGNOSIS — E669 Obesity, unspecified: Secondary | ICD-10-CM | POA: Diagnosis not present

## 2020-12-19 DIAGNOSIS — E119 Type 2 diabetes mellitus without complications: Secondary | ICD-10-CM | POA: Diagnosis not present

## 2020-12-19 NOTE — Telephone Encounter (Signed)
Approved. Effective from 12/19/2020 through 03/13/2021. Pharmacy and Mr Adkins notified.  There is a stipulation attached to the approval of the Morphine Sulfate Er. It was actually denied, but the pt will be able to fill the rx for 12 weeks until reason for denial is resolved. Because he had no pain management plan on file, he does not meet criteria. I explained that this was prescribed in rehab unit and he has not had post hospital follow up, but once that happens he will sign a CSA and the plan will be in place.  We are to fax the CSA and clinical note with pain management plan to [facility] when that occurs and this will resolve the official denial. Fax to [phone] (see prior auth denial letter scanned in media page 2)

## 2020-12-19 NOTE — Telephone Encounter (Deleted)
Approved. Effective from 12/19/2020 through 03/13/2021. Pharmacy and Mr Parham notified.  There is a stipulation attached to the approval of the Morphine Sulfate Er. It was actually denied, but the pt will be able to fill the rx for 12 weeks until reason for denial is resolved. Because he had no pain management plan on file, he does not meet criteria. I explained that this was prescribed in rehab unit and he has not had post hospital follow up, but once that happens he will sign a CSA and the plan will be in place.  We are to fax the CSA and clinical note with pain management plan to [facility] when that occurs and this will resolve the official denial. Fax to [phone]

## 2020-12-19 NOTE — Telephone Encounter (Signed)
Home Health Verbal Orders - Caller/Agency: Jenny// [facility] Callback Number: [phone] [id number]  [id number] OT/PT/Skilled Nursing/Social Work/Speech Therapy: Speech Therapy  Frequency: 2x for 1 week, 1x 3 weeks

## 2020-12-19 NOTE — Telephone Encounter (Signed)
Prior auth submitted to [employer] for Morphine Sulfate ER via CoverMyMeds.

## 2020-12-19 NOTE — Telephone Encounter (Signed)
Called back unable to reach, left VM to call back

## 2020-12-20 ENCOUNTER — Telehealth: Payer: Self-pay | Admitting: Internal Medicine

## 2020-12-20 DIAGNOSIS — L89894 Pressure ulcer of other site, stage 4: Secondary | ICD-10-CM | POA: Diagnosis not present

## 2020-12-20 DIAGNOSIS — Z9049 Acquired absence of other specified parts of digestive tract: Secondary | ICD-10-CM | POA: Diagnosis not present

## 2020-12-20 DIAGNOSIS — E669 Obesity, unspecified: Secondary | ICD-10-CM | POA: Diagnosis not present

## 2020-12-20 DIAGNOSIS — I4891 Unspecified atrial fibrillation: Secondary | ICD-10-CM | POA: Diagnosis not present

## 2020-12-20 DIAGNOSIS — I69322 Dysarthria following cerebral infarction: Secondary | ICD-10-CM | POA: Diagnosis not present

## 2020-12-20 DIAGNOSIS — D649 Anemia, unspecified: Secondary | ICD-10-CM | POA: Diagnosis not present

## 2020-12-20 DIAGNOSIS — I10 Essential (primary) hypertension: Secondary | ICD-10-CM | POA: Diagnosis not present

## 2020-12-20 DIAGNOSIS — E119 Type 2 diabetes mellitus without complications: Secondary | ICD-10-CM | POA: Diagnosis not present

## 2020-12-20 DIAGNOSIS — G40909 Epilepsy, unspecified, not intractable, without status epilepticus: Secondary | ICD-10-CM | POA: Diagnosis not present

## 2020-12-20 DIAGNOSIS — I69354 Hemiplegia and hemiparesis following cerebral infarction affecting left non-dominant side: Secondary | ICD-10-CM | POA: Diagnosis not present

## 2020-12-20 DIAGNOSIS — I69328 Other speech and language deficits following cerebral infarction: Secondary | ICD-10-CM | POA: Diagnosis not present

## 2020-12-20 DIAGNOSIS — Z6834 Body mass index (BMI) 34.0-34.9, adult: Secondary | ICD-10-CM | POA: Diagnosis not present

## 2020-12-20 DIAGNOSIS — E785 Hyperlipidemia, unspecified: Secondary | ICD-10-CM | POA: Diagnosis not present

## 2020-12-20 DIAGNOSIS — Z96642 Presence of left artificial hip joint: Secondary | ICD-10-CM | POA: Diagnosis not present

## 2020-12-20 DIAGNOSIS — Z85048 Personal history of other malignant neoplasm of rectum, rectosigmoid junction, and anus: Secondary | ICD-10-CM | POA: Diagnosis not present

## 2020-12-20 DIAGNOSIS — M109 Gout, unspecified: Secondary | ICD-10-CM | POA: Diagnosis not present

## 2020-12-20 NOTE — Telephone Encounter (Signed)
Home Health Verbal Orders - Caller/Agency: [facility] Number: [phone]  Requesting OT/PT/Skilled Nursing/Social Work/Speech Therapy: Occupational Health  Frequency:  1w5

## 2020-12-20 NOTE — Telephone Encounter (Signed)
Returned [place] call and gave verbal orders

## 2020-12-21 ENCOUNTER — Telehealth: Payer: Self-pay | Admitting: Internal Medicine

## 2020-12-21 DIAGNOSIS — L89159 Pressure ulcer of sacral region, unspecified stage: Secondary | ICD-10-CM

## 2020-12-21 NOTE — Telephone Encounter (Signed)
Copied from [place] [id number]. Topic: General - Other >> Dec 21, 2020 12:53 PM Leward Quan A wrote: Reason for CRM: Levada Dy RN case manger with  [facility] would like Dr Wynetta Emery to refer patient to [facility]  for a Stage 4 pressure injury to the sacrum. Addition question please call Ph# [phone]

## 2020-12-21 NOTE — Telephone Encounter (Signed)
Returned [place] call and lvm giving verbal orders and if she has any questions or concerns to give [place] a call

## 2020-12-24 ENCOUNTER — Ambulatory Visit: Payer: BC Managed Care – PPO | Admitting: Podiatry

## 2020-12-24 DIAGNOSIS — Z96642 Presence of left artificial hip joint: Secondary | ICD-10-CM | POA: Diagnosis not present

## 2020-12-24 DIAGNOSIS — Z85048 Personal history of other malignant neoplasm of rectum, rectosigmoid junction, and anus: Secondary | ICD-10-CM | POA: Diagnosis not present

## 2020-12-24 DIAGNOSIS — D649 Anemia, unspecified: Secondary | ICD-10-CM | POA: Diagnosis not present

## 2020-12-24 DIAGNOSIS — Z6834 Body mass index (BMI) 34.0-34.9, adult: Secondary | ICD-10-CM | POA: Diagnosis not present

## 2020-12-24 DIAGNOSIS — I10 Essential (primary) hypertension: Secondary | ICD-10-CM | POA: Diagnosis not present

## 2020-12-24 DIAGNOSIS — I4891 Unspecified atrial fibrillation: Secondary | ICD-10-CM | POA: Diagnosis not present

## 2020-12-24 DIAGNOSIS — I69322 Dysarthria following cerebral infarction: Secondary | ICD-10-CM | POA: Diagnosis not present

## 2020-12-24 DIAGNOSIS — L89894 Pressure ulcer of other site, stage 4: Secondary | ICD-10-CM | POA: Diagnosis not present

## 2020-12-24 DIAGNOSIS — G40909 Epilepsy, unspecified, not intractable, without status epilepticus: Secondary | ICD-10-CM | POA: Diagnosis not present

## 2020-12-24 DIAGNOSIS — M109 Gout, unspecified: Secondary | ICD-10-CM | POA: Diagnosis not present

## 2020-12-24 DIAGNOSIS — E669 Obesity, unspecified: Secondary | ICD-10-CM | POA: Diagnosis not present

## 2020-12-24 DIAGNOSIS — Z9049 Acquired absence of other specified parts of digestive tract: Secondary | ICD-10-CM | POA: Diagnosis not present

## 2020-12-24 DIAGNOSIS — I69328 Other speech and language deficits following cerebral infarction: Secondary | ICD-10-CM | POA: Diagnosis not present

## 2020-12-24 DIAGNOSIS — E785 Hyperlipidemia, unspecified: Secondary | ICD-10-CM | POA: Diagnosis not present

## 2020-12-24 DIAGNOSIS — I69354 Hemiplegia and hemiparesis following cerebral infarction affecting left non-dominant side: Secondary | ICD-10-CM | POA: Diagnosis not present

## 2020-12-24 DIAGNOSIS — E119 Type 2 diabetes mellitus without complications: Secondary | ICD-10-CM | POA: Diagnosis not present

## 2020-12-25 ENCOUNTER — Telehealth: Payer: Self-pay | Admitting: Gastroenterology

## 2020-12-25 ENCOUNTER — Telehealth: Payer: Self-pay

## 2020-12-25 ENCOUNTER — Other Ambulatory Visit: Payer: Self-pay

## 2020-12-25 ENCOUNTER — Ambulatory Visit: Payer: BC Managed Care – PPO | Attending: Internal Medicine | Admitting: Internal Medicine

## 2020-12-25 DIAGNOSIS — I69328 Other speech and language deficits following cerebral infarction: Secondary | ICD-10-CM | POA: Diagnosis not present

## 2020-12-25 DIAGNOSIS — L89154 Pressure ulcer of sacral region, stage 4: Secondary | ICD-10-CM

## 2020-12-25 DIAGNOSIS — N182 Chronic kidney disease, stage 2 (mild): Secondary | ICD-10-CM

## 2020-12-25 DIAGNOSIS — I69322 Dysarthria following cerebral infarction: Secondary | ICD-10-CM | POA: Diagnosis not present

## 2020-12-25 DIAGNOSIS — F320 Major depressive disorder, single episode, mild: Secondary | ICD-10-CM

## 2020-12-25 DIAGNOSIS — E119 Type 2 diabetes mellitus without complications: Secondary | ICD-10-CM | POA: Diagnosis not present

## 2020-12-25 DIAGNOSIS — L89894 Pressure ulcer of other site, stage 4: Secondary | ICD-10-CM | POA: Diagnosis not present

## 2020-12-25 DIAGNOSIS — R2689 Other abnormalities of gait and mobility: Secondary | ICD-10-CM

## 2020-12-25 DIAGNOSIS — K2210 Ulcer of esophagus without bleeding: Secondary | ICD-10-CM

## 2020-12-25 DIAGNOSIS — M109 Gout, unspecified: Secondary | ICD-10-CM | POA: Diagnosis not present

## 2020-12-25 DIAGNOSIS — I69354 Hemiplegia and hemiparesis following cerebral infarction affecting left non-dominant side: Secondary | ICD-10-CM | POA: Diagnosis not present

## 2020-12-25 DIAGNOSIS — Z6834 Body mass index (BMI) 34.0-34.9, adult: Secondary | ICD-10-CM | POA: Diagnosis not present

## 2020-12-25 DIAGNOSIS — Z9049 Acquired absence of other specified parts of digestive tract: Secondary | ICD-10-CM | POA: Diagnosis not present

## 2020-12-25 DIAGNOSIS — D126 Benign neoplasm of colon, unspecified: Secondary | ICD-10-CM

## 2020-12-25 DIAGNOSIS — Z96642 Presence of left artificial hip joint: Secondary | ICD-10-CM | POA: Diagnosis not present

## 2020-12-25 DIAGNOSIS — Z09 Encounter for follow-up examination after completed treatment for conditions other than malignant neoplasm: Secondary | ICD-10-CM | POA: Diagnosis not present

## 2020-12-25 DIAGNOSIS — E669 Obesity, unspecified: Secondary | ICD-10-CM | POA: Diagnosis not present

## 2020-12-25 DIAGNOSIS — Z85048 Personal history of other malignant neoplasm of rectum, rectosigmoid junction, and anus: Secondary | ICD-10-CM | POA: Diagnosis not present

## 2020-12-25 DIAGNOSIS — I4891 Unspecified atrial fibrillation: Secondary | ICD-10-CM | POA: Diagnosis not present

## 2020-12-25 DIAGNOSIS — D509 Iron deficiency anemia, unspecified: Secondary | ICD-10-CM

## 2020-12-25 DIAGNOSIS — I480 Paroxysmal atrial fibrillation: Secondary | ICD-10-CM | POA: Diagnosis not present

## 2020-12-25 DIAGNOSIS — D649 Anemia, unspecified: Secondary | ICD-10-CM | POA: Diagnosis not present

## 2020-12-25 DIAGNOSIS — E785 Hyperlipidemia, unspecified: Secondary | ICD-10-CM | POA: Diagnosis not present

## 2020-12-25 DIAGNOSIS — I10 Essential (primary) hypertension: Secondary | ICD-10-CM | POA: Diagnosis not present

## 2020-12-25 DIAGNOSIS — Z9181 History of falling: Secondary | ICD-10-CM

## 2020-12-25 DIAGNOSIS — G40909 Epilepsy, unspecified, not intractable, without status epilepticus: Secondary | ICD-10-CM | POA: Diagnosis not present

## 2020-12-25 MED ORDER — CARVEDILOL 12.5 MG PO TABS: 12.5000 mg | ORAL_TABLET | Freq: Two times a day (BID) | ORAL | 3 refills | Status: DC

## 2020-12-25 MED ORDER — CITALOPRAM HYDROBROMIDE 20 MG PO TABS: 20.0000 mg | ORAL_TABLET | Freq: Every day | ORAL | 3 refills | Status: DC

## 2020-12-25 MED ORDER — DILTIAZEM HCL 30 MG PO TABS
30.0000 mg | ORAL_TABLET | Freq: Four times a day (QID) | ORAL | 3 refills | Status: DC
Start: 2020-12-25 — End: 2021-01-10

## 2020-12-25 NOTE — Telephone Encounter (Signed)
Patients wife called states he just had a procedure and is still healing would like to know what the upcoming appointment is for and decide if she would need to bring him.

## 2020-12-25 NOTE — Progress Notes (Addendum)
Patient ID: [name], male   DOB: 12/18/1965, 55 y.o.   MRN: [id number] Virtual Visit via Telephone Note This was supposed to be a video visit.  However patient's wife had difficulty getting the video on her phone turned on so we had to convert to a telephone visit. I connected with [name] on 12/25/2020 at 11:46 AM by telephone and verified that I am speaking with the correct person using two identifiers  Location: Patient: home Provider: office  Participants: Myself Patient and wife.  Wife gives most of the history.   I discussed the limitations, risks, security and privacy concerns of performing an evaluation and management service by telephone and the availability of in person appointments. I also discussed with the patient that there may be a patient responsible charge related to this service. The patient expressed understanding and agreed to proceed.   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   This is a TOC visit.   Pt hosp 11/2-11/2020 then transfer to in-pt rehab from 11/11-12/02/2020.  Phone call with case worker was done 12/21/2020  Patient was hospitalized after he fell at home while trying to transfer out of his chair.  He was found to be in atrial fibrillation with RVR and was started on Cardizem drip and converted to NSR.  Started on IV heparin but this had to be discontinued due to worsening anemia.  2D echo revealed EF of 60 to 65% with mild concentric LVH and mild biatrial dilatation.  Also found to have acute on chronic renal insufficiency -EGD and c-scope done by Dr. Lorenso Courier revealed linear esophageal ulcers without bleeding or stigmata of recent bleeding, erythematous mucosa in the gastric antrum and multiple ulcers at hepatic flexure of the ascending colon and cecum, 4 mm polyp removed, nonbleeding internal hemorrhoids.  Pathology from  EGD showed chronic gastritis with negative H. pylori.  Biopsy from colon showed colonic mucosa with healed surface ulcer, polyp was a tubular adenoma without high-grade dysplasia or malignancy.   -Patient started on Protonix.  Plan for repeat EGD in 8 weeks and to decide DOAC at that time. -Patient with microcytic anemia in hospital and chronic renal insufficiency with creatinine at the time of discharge of 1.4 and GFR around 60. -Developed sacral ulcer and was seen by wound care. -Developed acute gout episode and was initially placed on colchicine.  Later placed on prednisone during stay in inpatient rehab. -Transferred to inpatient rehab on 11/23/2020.  Blood pressure was running a bit low.  Blood pressure medication adjusted. -Sacral pain from decubitus ulcer limited his ability to participate fully in PT/OT.  Patient was placed on morphine with oxycodone for breakthrough pain.  Wound care was consulted and he was placed on air mattress overlay.  Today: Atrial fibrillation/HTN: Wife reports that patient is taking the carvedilol, amiodarone and diltiazem.  Patient denies any palpitations.  They have been checking his blood pressure daily.  Reading this morning was 118/77. -They have not scheduled follow-up appointment with Dr. Doylene Canard as yet.  Patient has upcoming appointment with gastroenterology Dr. Ardis Hughs later this month.  According to the hospital note, the plan is for repeat EGD after he has been on Protonix for 8 weeks to see if ulcers have healed then decision can be made about putting him on an anticoagulant for the atrial fibrillation.  Decubitus ulcer: Patient has an appointment  with wound care 12/27/2020.  Wife has been doing dressing changes twice a day using Santyl ointment.  RN from [facility] came out last week to the house to do an assessment.  She has ordered more dressing supplies for him.  In regards to his pain, patient was discharged from the hospital on MS Contin 15 mg every 12  hours and oxycodone 5 to 10 mg every 6 hours.  Patient ran out of the MS Contin 3 days ago.  He has taken oxycodone only once since then. -Patient has semiautomatic hospital bed with air mattress.  The bed is at the lowest level to the floor that he can go.  However patient still has difficulty being able to get out of the bed without major assistance because his feet do not touch the floor sitting on the side of the bed.  Wife called [facility] to see what suggestions they may have for another bed.  However she was told that she would need to speak with his PCP.   -  PT, OT and ST have all been out.  Plan is for PT twice a week and OT and speech therapy once a week.  Wife reports that patient is still very weak in his legs.  Patient has follow-up appointment scheduled with PMR Dr. Alice Rieger but is not until March of next year.  Depression: He was started on Celexa in the hospital for depression.  Patient reports that the medication is helpful.  He was also started on trazodone for insomnia.   Outpatient Encounter Medications as of 12/25/2020  Medication Sig   amiodarone (PACERONE) 200 MG tablet Take 1 tablet (200 mg total) by mouth 2 (two) times daily.   atorvastatin (LIPITOR) 80 MG tablet TAKE 1 TABLET BY MOUTH EVERY DAY (Patient taking differently: Take 80 mg by mouth daily.)   carvedilol (COREG) 12.5 MG tablet Take 1 tablet (12.5 mg total) by mouth 2 (two) times daily with a meal.   citalopram (CELEXA) 20 MG tablet Take 1 tablet (20 mg total) by mouth daily.   collagenase (SANTYL) ointment Apply topically 2 (two) times daily. To yellow slough and cover with damp to dry dressing.  Change dressing twice a day   diltiazem (CARDIZEM) 30 MG tablet Take 1 tablet (30 mg total) by mouth every 6 (six) hours.   morphine (MS CONTIN) 15 MG 12 hr tablet Take 1 tablet (15 mg total) by mouth every 12 (twelve) hours.   oxybutynin (DITROPAN) 5 MG tablet TAKE 1 TABLET BY MOUTH TWICE A DAY   oxyCODONE (OXY  IR/ROXICODONE) 5 MG immediate release tablet Take 1-2 tablets (5-10 mg total) by mouth every 6 (six) hours as needed for severe pain.   pantoprazole (PROTONIX) 40 MG tablet Take 1 tablet (40 mg total) by mouth 2 (two) times daily.   traZODone (DESYREL) 150 MG tablet Take 1/2 tablet (75 mg total) by mouth at bedtime.   No facility-administered encounter medications on file as of 12/25/2020.      Observations/Objective: No direct observation done as this was a telephone encounter.  Lab Results  Component Value Date   WBC 4.9 12/10/2020   HGB 9.4 (L) 12/10/2020   HCT 29.5 (L) 12/10/2020   MCV 78.7 (L) 12/10/2020   PLT 198 12/10/2020     Chemistry      Component Value Date/Time   NA 132 (L) 12/13/2020 0538   NA 145 (H) 06/07/2020 1019   K 4.7 12/13/2020 0538   CL 99 12/13/2020 0538  CO2 24 12/13/2020 0538   BUN 19 12/13/2020 0538   BUN 17 06/07/2020 1019   CREATININE 1.37 (H) 12/13/2020 0538      Component Value Date/Time   CALCIUM 9.0 12/13/2020 0538   ALKPHOS 64 12/13/2020 0538   AST 14 (L) 12/13/2020 0538   ALT 13 12/13/2020 0538   BILITOT 0.6 12/13/2020 0538   BILITOT 0.4 06/07/2020 1019     Lab Results  Component Value Date   IRON 22 (L) 11/14/2020   TIBC 211 (L) 11/14/2020   FERRITIN 388 (H) 11/14/2020     Assessment and Plan: 1. Hospital discharge follow-up   2. PAF (paroxysmal atrial fibrillation) ([place]) -Advised wife to call Dr. Merrilee Jansky office and schedule a posthospital follow-up especially with him being on amiodarone.  Plan is to hold off on starting anticoagulation until he has repeat EGD showing healed esophageal ulcers. - carvedilol (COREG) 12.5 MG tablet; Take 1 tablet (12.5 mg total) by mouth 2 (two) times daily with a meal.  Dispense: 60 tablet; Refill: 3 - diltiazem (CARDIZEM) 30 MG tablet; Take 1 tablet (30 mg total) by mouth every 6 (six) hours.  Dispense: 120 tablet; Refill: 3  3. Sacral decubitus ulcer, stage IV ([place]) Keep upcoming  appointment with wound clinic on the 15th of this month.  Wife to continue twice a day dressing changes as instructed upon hospital discharge. -I recommend calling Dr. Arsenio Katz office and requesting his appointment to be moved up. -For now he seems to be getting along okay without the MS Contin.  I advised using the oxycodone as needed instead.  4. Esophageal ulcer without bleeding 5. Adenomatous polyp of colon, unspecified part of colon Keep upcoming appointment with Dr. Ardis Hughs later this month.  6. Microcytic anemia - CBC With Differential; Future  7. CKD (chronic kidney disease) stage 2, GFR 60-89 ml/min We will monitor. - Comprehensive metabolic panel; Future  8. Mild major depression (HCC) - citalopram (CELEXA) 20 MG tablet; Take 1 tablet (20 mg total) by mouth daily.  Dispense: 30 tablet; Refill: 3  9.  History of recent falls 10.  Decreased mobility -In regards to the wife's concern about patient having difficulty getting out of the bed from the air mattress, I asked about whether having a stepdown stool would help.  She states that the patient's legs are too weak for him to step up or step down on a stool.  I told her that the other option would be to put the mattress on the floor and air mattress on top of it.  However I will have our caseworker touch base with [facility] to see if there are other options for beds that would be closer to the ground for him. -Agree with home physical therapy, Occupational Therapy and speech therapy. Follow Up Instructions: Patient scheduled to see me in early February.   I discussed the assessment and treatment plan with the patient. The patient was provided an opportunity to ask questions and all were answered. The patient agreed with the plan and demonstrated an understanding of the instructions.   The patient was advised to call back or seek an in-person evaluation if the symptoms worsen or if the condition fails to improve as  anticipated.  I  Spent 30 minutes on this telephone encounter  Karle Plumber, MD

## 2020-12-25 NOTE — Telephone Encounter (Signed)
The pt has an appt with Amy Esterwood on 12/19 for rectal cancer and anemia.  It looks like the pt called and made the appt himself.  He is wanting to know if he needs to keep the appt. He says it is difficult for him to travel due to recent surgery.  I have asked him to keep the appt if he is able since it has been a while since he was seen in the office.  He will call back on Friday if he feels he can not make the appt.

## 2020-12-25 NOTE — Telephone Encounter (Signed)
At request of Dr Wynetta Emery, call placed to patient's wife, [name], regarding patient's difficulty getting in/out of bed.  She explained that when the bed was first delivered, the air mattress was not available, so a gel overlay was delivered with the bed. With assistance, the patient was able to get on/off the bed with the gel overlay.    The air mattress has since been delivered because that was recommended as best option for patient's wound healing. Even with the bed in the lowest position, when the air mattress is on, it is too high for patient to get on/off. [name] said that she had to have a friend help her get him back in bed on Friday, 12/21/2020 and she has  not able to get him out of bed since then because she is not able to get him back in the bed by herself.  He doesn't have enough strength in his legs to use the step stool.   She said that she called [facility] and explained the situation of the bed being too high with the air mattress and they told her to talk to her husband's doctor about the issue. Explained to her that Dr Wynetta Emery may be able to order something else that would help but she would need to know what to order.     She said that the home health PT is coming tomorrow. Requested that she call this CM when the PT is there to discuss recommendations - different bed? hoyer lift?  A different mattress that will still promote wound healing? [name] said she would call this clinic when the PT arrives tomorrow around 1000.  The patient has an appointment with the wound specialist on 12/27/2020 Instructed her to explain to the wound care provider the difficulties she is having with moving patient in/out of bed and see if there is another mattress option.

## 2020-12-26 ENCOUNTER — Telehealth: Payer: Self-pay | Admitting: Internal Medicine

## 2020-12-26 DIAGNOSIS — E669 Obesity, unspecified: Secondary | ICD-10-CM | POA: Diagnosis not present

## 2020-12-26 DIAGNOSIS — I69328 Other speech and language deficits following cerebral infarction: Secondary | ICD-10-CM | POA: Diagnosis not present

## 2020-12-26 DIAGNOSIS — L89894 Pressure ulcer of other site, stage 4: Secondary | ICD-10-CM | POA: Diagnosis not present

## 2020-12-26 DIAGNOSIS — I10 Essential (primary) hypertension: Secondary | ICD-10-CM | POA: Diagnosis not present

## 2020-12-26 DIAGNOSIS — Z96642 Presence of left artificial hip joint: Secondary | ICD-10-CM | POA: Diagnosis not present

## 2020-12-26 DIAGNOSIS — I69354 Hemiplegia and hemiparesis following cerebral infarction affecting left non-dominant side: Secondary | ICD-10-CM | POA: Diagnosis not present

## 2020-12-26 DIAGNOSIS — M109 Gout, unspecified: Secondary | ICD-10-CM | POA: Diagnosis not present

## 2020-12-26 DIAGNOSIS — D649 Anemia, unspecified: Secondary | ICD-10-CM | POA: Diagnosis not present

## 2020-12-26 DIAGNOSIS — Z85048 Personal history of other malignant neoplasm of rectum, rectosigmoid junction, and anus: Secondary | ICD-10-CM | POA: Diagnosis not present

## 2020-12-26 DIAGNOSIS — Z6834 Body mass index (BMI) 34.0-34.9, adult: Secondary | ICD-10-CM | POA: Diagnosis not present

## 2020-12-26 DIAGNOSIS — E785 Hyperlipidemia, unspecified: Secondary | ICD-10-CM | POA: Diagnosis not present

## 2020-12-26 DIAGNOSIS — I4891 Unspecified atrial fibrillation: Secondary | ICD-10-CM | POA: Diagnosis not present

## 2020-12-26 DIAGNOSIS — I69322 Dysarthria following cerebral infarction: Secondary | ICD-10-CM | POA: Diagnosis not present

## 2020-12-26 DIAGNOSIS — E119 Type 2 diabetes mellitus without complications: Secondary | ICD-10-CM | POA: Diagnosis not present

## 2020-12-26 DIAGNOSIS — G40909 Epilepsy, unspecified, not intractable, without status epilepticus: Secondary | ICD-10-CM | POA: Diagnosis not present

## 2020-12-26 DIAGNOSIS — Z9049 Acquired absence of other specified parts of digestive tract: Secondary | ICD-10-CM | POA: Diagnosis not present

## 2020-12-26 NOTE — Telephone Encounter (Signed)
Pt's wife called back while PT was in the home, says she was instructed to call and request to speak to Opal Sidles while PT was in the home. Please advise

## 2020-12-26 NOTE — Telephone Encounter (Signed)
Copied from [place] [id number]. Topic: General - Other >> Dec 26, 2020 10:21 AM Rayann Heman wrote: Reason for CRM: Pt wife called and stated that she would like a call back from the case manager today. Please advise.

## 2020-12-26 NOTE — Telephone Encounter (Signed)
Call returned to patient's wife, [name]. The PT was still in the home with patient.  [name] explained that they reached out to [employer] for options for bed /thinner mattress that would ease transfers.  [name] said that [employer] representative will need to inquire about options and call her back..  The PT said that she would not recommend a hoyer at this point due to concerns about pressure on wound and she also believes it would set patient back with the progress he is making with mobility.  She explained that she had to pick him up and put him back to bed and she would not recommend that patient's wife use this method to transfer him.  At this point, the height of the air mattress is the barrier to easily transferring the patient.  [name] will discuss these concerns about transfers with the wound specialist and seek possible recommendations for an alternative mattress that would still promote wound healing.

## 2020-12-27 ENCOUNTER — Encounter (HOSPITAL_BASED_OUTPATIENT_CLINIC_OR_DEPARTMENT_OTHER): Payer: BC Managed Care – PPO | Admitting: Internal Medicine

## 2020-12-27 DIAGNOSIS — G40909 Epilepsy, unspecified, not intractable, without status epilepticus: Secondary | ICD-10-CM | POA: Diagnosis not present

## 2020-12-27 DIAGNOSIS — E119 Type 2 diabetes mellitus without complications: Secondary | ICD-10-CM | POA: Diagnosis not present

## 2020-12-27 DIAGNOSIS — E785 Hyperlipidemia, unspecified: Secondary | ICD-10-CM | POA: Diagnosis not present

## 2020-12-27 DIAGNOSIS — E669 Obesity, unspecified: Secondary | ICD-10-CM | POA: Diagnosis not present

## 2020-12-27 DIAGNOSIS — Z85048 Personal history of other malignant neoplasm of rectum, rectosigmoid junction, and anus: Secondary | ICD-10-CM | POA: Diagnosis not present

## 2020-12-27 DIAGNOSIS — Z6834 Body mass index (BMI) 34.0-34.9, adult: Secondary | ICD-10-CM | POA: Diagnosis not present

## 2020-12-27 DIAGNOSIS — I10 Essential (primary) hypertension: Secondary | ICD-10-CM | POA: Diagnosis not present

## 2020-12-27 DIAGNOSIS — M109 Gout, unspecified: Secondary | ICD-10-CM | POA: Diagnosis not present

## 2020-12-27 DIAGNOSIS — L89894 Pressure ulcer of other site, stage 4: Secondary | ICD-10-CM | POA: Diagnosis not present

## 2020-12-27 DIAGNOSIS — Z96642 Presence of left artificial hip joint: Secondary | ICD-10-CM | POA: Diagnosis not present

## 2020-12-27 DIAGNOSIS — D649 Anemia, unspecified: Secondary | ICD-10-CM | POA: Diagnosis not present

## 2020-12-27 DIAGNOSIS — I69322 Dysarthria following cerebral infarction: Secondary | ICD-10-CM | POA: Diagnosis not present

## 2020-12-27 DIAGNOSIS — I69328 Other speech and language deficits following cerebral infarction: Secondary | ICD-10-CM | POA: Diagnosis not present

## 2020-12-27 DIAGNOSIS — I4891 Unspecified atrial fibrillation: Secondary | ICD-10-CM | POA: Diagnosis not present

## 2020-12-27 DIAGNOSIS — I69354 Hemiplegia and hemiparesis following cerebral infarction affecting left non-dominant side: Secondary | ICD-10-CM | POA: Diagnosis not present

## 2020-12-27 DIAGNOSIS — Z9049 Acquired absence of other specified parts of digestive tract: Secondary | ICD-10-CM | POA: Diagnosis not present

## 2020-12-27 NOTE — Telephone Encounter (Signed)
This has been addressed in another note.

## 2020-12-28 DIAGNOSIS — Z9049 Acquired absence of other specified parts of digestive tract: Secondary | ICD-10-CM | POA: Diagnosis not present

## 2020-12-28 DIAGNOSIS — I4891 Unspecified atrial fibrillation: Secondary | ICD-10-CM | POA: Diagnosis not present

## 2020-12-28 DIAGNOSIS — Z6834 Body mass index (BMI) 34.0-34.9, adult: Secondary | ICD-10-CM | POA: Diagnosis not present

## 2020-12-28 DIAGNOSIS — Z85048 Personal history of other malignant neoplasm of rectum, rectosigmoid junction, and anus: Secondary | ICD-10-CM | POA: Diagnosis not present

## 2020-12-28 DIAGNOSIS — Z96642 Presence of left artificial hip joint: Secondary | ICD-10-CM | POA: Diagnosis not present

## 2020-12-28 DIAGNOSIS — I69322 Dysarthria following cerebral infarction: Secondary | ICD-10-CM | POA: Diagnosis not present

## 2020-12-28 DIAGNOSIS — L89894 Pressure ulcer of other site, stage 4: Secondary | ICD-10-CM | POA: Diagnosis not present

## 2020-12-28 DIAGNOSIS — E785 Hyperlipidemia, unspecified: Secondary | ICD-10-CM | POA: Diagnosis not present

## 2020-12-28 DIAGNOSIS — I69354 Hemiplegia and hemiparesis following cerebral infarction affecting left non-dominant side: Secondary | ICD-10-CM | POA: Diagnosis not present

## 2020-12-28 DIAGNOSIS — E669 Obesity, unspecified: Secondary | ICD-10-CM | POA: Diagnosis not present

## 2020-12-28 DIAGNOSIS — G40909 Epilepsy, unspecified, not intractable, without status epilepticus: Secondary | ICD-10-CM | POA: Diagnosis not present

## 2020-12-28 DIAGNOSIS — I69328 Other speech and language deficits following cerebral infarction: Secondary | ICD-10-CM | POA: Diagnosis not present

## 2020-12-28 DIAGNOSIS — E119 Type 2 diabetes mellitus without complications: Secondary | ICD-10-CM | POA: Diagnosis not present

## 2020-12-28 DIAGNOSIS — I10 Essential (primary) hypertension: Secondary | ICD-10-CM | POA: Diagnosis not present

## 2020-12-28 DIAGNOSIS — D649 Anemia, unspecified: Secondary | ICD-10-CM | POA: Diagnosis not present

## 2020-12-28 DIAGNOSIS — M109 Gout, unspecified: Secondary | ICD-10-CM | POA: Diagnosis not present

## 2020-12-31 ENCOUNTER — Ambulatory Visit: Payer: BC Managed Care – PPO | Admitting: Physician Assistant

## 2020-12-31 DIAGNOSIS — I69328 Other speech and language deficits following cerebral infarction: Secondary | ICD-10-CM | POA: Diagnosis not present

## 2020-12-31 DIAGNOSIS — L89894 Pressure ulcer of other site, stage 4: Secondary | ICD-10-CM | POA: Diagnosis not present

## 2020-12-31 DIAGNOSIS — I69322 Dysarthria following cerebral infarction: Secondary | ICD-10-CM | POA: Diagnosis not present

## 2020-12-31 DIAGNOSIS — M109 Gout, unspecified: Secondary | ICD-10-CM | POA: Diagnosis not present

## 2020-12-31 DIAGNOSIS — I69354 Hemiplegia and hemiparesis following cerebral infarction affecting left non-dominant side: Secondary | ICD-10-CM | POA: Diagnosis not present

## 2020-12-31 DIAGNOSIS — Z6834 Body mass index (BMI) 34.0-34.9, adult: Secondary | ICD-10-CM | POA: Diagnosis not present

## 2020-12-31 DIAGNOSIS — E669 Obesity, unspecified: Secondary | ICD-10-CM | POA: Diagnosis not present

## 2020-12-31 DIAGNOSIS — I4891 Unspecified atrial fibrillation: Secondary | ICD-10-CM | POA: Diagnosis not present

## 2020-12-31 DIAGNOSIS — I10 Essential (primary) hypertension: Secondary | ICD-10-CM | POA: Diagnosis not present

## 2020-12-31 DIAGNOSIS — Z9049 Acquired absence of other specified parts of digestive tract: Secondary | ICD-10-CM | POA: Diagnosis not present

## 2020-12-31 DIAGNOSIS — G40909 Epilepsy, unspecified, not intractable, without status epilepticus: Secondary | ICD-10-CM | POA: Diagnosis not present

## 2020-12-31 DIAGNOSIS — D649 Anemia, unspecified: Secondary | ICD-10-CM | POA: Diagnosis not present

## 2020-12-31 DIAGNOSIS — E785 Hyperlipidemia, unspecified: Secondary | ICD-10-CM | POA: Diagnosis not present

## 2020-12-31 DIAGNOSIS — E119 Type 2 diabetes mellitus without complications: Secondary | ICD-10-CM | POA: Diagnosis not present

## 2020-12-31 DIAGNOSIS — Z96642 Presence of left artificial hip joint: Secondary | ICD-10-CM | POA: Diagnosis not present

## 2020-12-31 DIAGNOSIS — Z85048 Personal history of other malignant neoplasm of rectum, rectosigmoid junction, and anus: Secondary | ICD-10-CM | POA: Diagnosis not present

## 2021-01-01 DIAGNOSIS — I69322 Dysarthria following cerebral infarction: Secondary | ICD-10-CM | POA: Diagnosis not present

## 2021-01-01 DIAGNOSIS — I10 Essential (primary) hypertension: Secondary | ICD-10-CM | POA: Diagnosis not present

## 2021-01-01 DIAGNOSIS — M109 Gout, unspecified: Secondary | ICD-10-CM | POA: Diagnosis not present

## 2021-01-01 DIAGNOSIS — Z96642 Presence of left artificial hip joint: Secondary | ICD-10-CM | POA: Diagnosis not present

## 2021-01-01 DIAGNOSIS — E669 Obesity, unspecified: Secondary | ICD-10-CM | POA: Diagnosis not present

## 2021-01-01 DIAGNOSIS — L89894 Pressure ulcer of other site, stage 4: Secondary | ICD-10-CM | POA: Diagnosis not present

## 2021-01-01 DIAGNOSIS — G40909 Epilepsy, unspecified, not intractable, without status epilepticus: Secondary | ICD-10-CM | POA: Diagnosis not present

## 2021-01-01 DIAGNOSIS — Z9049 Acquired absence of other specified parts of digestive tract: Secondary | ICD-10-CM | POA: Diagnosis not present

## 2021-01-01 DIAGNOSIS — E785 Hyperlipidemia, unspecified: Secondary | ICD-10-CM | POA: Diagnosis not present

## 2021-01-01 DIAGNOSIS — Z85048 Personal history of other malignant neoplasm of rectum, rectosigmoid junction, and anus: Secondary | ICD-10-CM | POA: Diagnosis not present

## 2021-01-01 DIAGNOSIS — D649 Anemia, unspecified: Secondary | ICD-10-CM | POA: Diagnosis not present

## 2021-01-01 DIAGNOSIS — Z6834 Body mass index (BMI) 34.0-34.9, adult: Secondary | ICD-10-CM | POA: Diagnosis not present

## 2021-01-01 DIAGNOSIS — I69354 Hemiplegia and hemiparesis following cerebral infarction affecting left non-dominant side: Secondary | ICD-10-CM | POA: Diagnosis not present

## 2021-01-01 DIAGNOSIS — I4891 Unspecified atrial fibrillation: Secondary | ICD-10-CM | POA: Diagnosis not present

## 2021-01-01 DIAGNOSIS — E119 Type 2 diabetes mellitus without complications: Secondary | ICD-10-CM | POA: Diagnosis not present

## 2021-01-01 DIAGNOSIS — I69328 Other speech and language deficits following cerebral infarction: Secondary | ICD-10-CM | POA: Diagnosis not present

## 2021-01-02 ENCOUNTER — Encounter: Payer: Self-pay | Admitting: Physical Medicine and Rehabilitation

## 2021-01-02 ENCOUNTER — Encounter
Payer: BC Managed Care – PPO | Attending: Physical Medicine and Rehabilitation | Admitting: Physical Medicine and Rehabilitation

## 2021-01-02 ENCOUNTER — Other Ambulatory Visit: Payer: Self-pay

## 2021-01-02 VITALS — BP 110/75 | HR 76 | Temp 99.8°F | Ht 62.0 in | Wt 249.1 lb

## 2021-01-02 DIAGNOSIS — I69328 Other speech and language deficits following cerebral infarction: Secondary | ICD-10-CM | POA: Insufficient documentation

## 2021-01-02 DIAGNOSIS — G894 Chronic pain syndrome: Secondary | ICD-10-CM | POA: Insufficient documentation

## 2021-01-02 DIAGNOSIS — Z79891 Long term (current) use of opiate analgesic: Secondary | ICD-10-CM | POA: Insufficient documentation

## 2021-01-02 DIAGNOSIS — R252 Cramp and spasm: Secondary | ICD-10-CM | POA: Diagnosis not present

## 2021-01-02 DIAGNOSIS — Z5181 Encounter for therapeutic drug level monitoring: Secondary | ICD-10-CM | POA: Insufficient documentation

## 2021-01-02 DIAGNOSIS — I10 Essential (primary) hypertension: Secondary | ICD-10-CM | POA: Diagnosis not present

## 2021-01-02 DIAGNOSIS — I69359 Hemiplegia and hemiparesis following cerebral infarction affecting unspecified side: Secondary | ICD-10-CM | POA: Insufficient documentation

## 2021-01-02 DIAGNOSIS — L89150 Pressure ulcer of sacral region, unstageable: Secondary | ICD-10-CM | POA: Diagnosis not present

## 2021-01-02 DIAGNOSIS — I422 Other hypertrophic cardiomyopathy: Secondary | ICD-10-CM | POA: Diagnosis not present

## 2021-01-02 DIAGNOSIS — I69398 Other sequelae of cerebral infarction: Secondary | ICD-10-CM | POA: Insufficient documentation

## 2021-01-02 DIAGNOSIS — I63511 Cerebral infarction due to unspecified occlusion or stenosis of right middle cerebral artery: Secondary | ICD-10-CM | POA: Diagnosis not present

## 2021-01-02 DIAGNOSIS — I480 Paroxysmal atrial fibrillation: Secondary | ICD-10-CM | POA: Diagnosis not present

## 2021-01-02 NOTE — Progress Notes (Signed)
Subjective:    Patient ID: [name], male    DOB: 11/22/1965, 55 y.o.   MRN: [id number]  HPI Pt is a 55 yr old male with previous CVA with L hemiplegia- with recent new onset Afib, and worsening spasticity; sacral unstagable ulcer-; gout flare- resolved; Acute on chornic AKI/CKD 3; DM with A1c of 6.7  Here for f/u on pain from ulcer and spasticity from CVA.   Pain is 1/10- wound is healing some-   Still taking the long acting and short acting pain medicine.  Takes long acting Last took short acting/Oxycodone was Sunday night.   Hasn't seen wound care yet- had to change appt to January- 01/18/21- for wound care center.   Has pictures from the last week, not today.   Has spasticity still of LUE, however not painful.      Pain Inventory Average Pain 4 Pain Right Now 1 My pain is constant and dull  In the last 24 hours, has pain interfered with the following? General activity 2 Relation with others 1 Enjoyment of life 5 What TIME of day is your pain at its worst? daytime Sleep (in general) Fair  Pain is worse with: sitting Pain improves with: medication Relief from Meds: 5  walk with assistance ability to climb steps?  no do you drive?  no use a wheelchair needs help with transfers  disabled: date disabled 12/12/2016 I need assistance with the following:  dressing, bathing, toileting, meal prep, household duties, and shopping  weakness tremor trouble walking dizziness  Any changes since last visit?  yes previouspt of Dr Letta Pate, recent discharge from [facility] 12/14/20  Any changes since last visit?  no    Family History  Problem Relation Age of Onset   Hypertension Mother    Heart disease Mother    Hyperlipidemia Mother    Heart failure Mother    Hypertension Sister    Hypertension Brother    Stroke Father    Hypertension Father    Colon cancer Maternal Grandmother    Diabetes Maternal Grandmother    Esophageal cancer Neg Hx    Social  History   Socioeconomic History   Marital status: Married    Spouse name: [name]   Number of children: 1   Years of education: 16   Highest education level: Not on file  Occupational History   Occupation: disabled  Tobacco Use   Smoking status: Never   Smokeless tobacco: Never  Vaping Use   Vaping Use: Never used  Substance and Sexual Activity   Alcohol use: Not Currently   Drug use: No   Sexual activity: Not Currently  Other Topics Concern   Not on file  Social History Narrative   Patient drinks caffeine a couple times a week.   Patient is right handed.   Lives with wife [name]) and daughter   Social Determinants of Health   Financial Resource Strain: Not on file  Food Insecurity: Not on file  Transportation Needs: Not on file  Physical Activity: Not on file  Stress: Not on file  Social Connections: Not on file   Past Surgical History:  Procedure Laterality Date   APPENDECTOMY     BIOPSY  11/18/2020   Procedure: BIOPSY;  Surgeon: Sharyn Creamer, MD;  Location: [facility];  Service: Gastroenterology;;   COLONOSCOPY N/A 11/18/2020   Procedure: COLONOSCOPY;  Surgeon: Sharyn Creamer, MD;  Location: [place];  Service: Gastroenterology;  Laterality: N/A;   ESOPHAGOGASTRODUODENOSCOPY (EGD) WITH  PROPOFOL N/A 11/18/2020   Procedure: ESOPHAGOGASTRODUODENOSCOPY (EGD) WITH PROPOFOL;  Surgeon: Sharyn Creamer, MD;  Location: [facility];  Service: Gastroenterology;  Laterality: N/A;   JOINT REPLACEMENT Left 2004   Left hip   POLYPECTOMY  11/18/2020   Procedure: POLYPECTOMY;  Surgeon: Sharyn Creamer, MD;  Location: [facility] ENDOSCOPY;  Service: Gastroenterology;;   TOTAL HIP ARTHROPLASTY     Past Medical History:  Diagnosis Date   Colon cancer ([place]) 2009   rectal   Diabetes mellitus without complication ([place])    Gait abnormality 03/31/2018   Hemiparesis and speech and language deficit as late effects of stroke ([place]) 03/31/2018   Hypertension    Seizure ([place])    Stroke  ([place]) 2018   BP 110/75    Pulse 76    Temp 99.8 F (37.7 C)    Ht 5\' 2"  (1.575 m)    Wt 249 lb 1.9 oz (113 kg) Comment: last recorded   BMI 45.56 kg/m   Opioid Risk Score:   Fall Risk Score:  `1  Depression screen PHQ 2/9  Depression screen [facility] 2/9 01/02/2021 10/15/2020 07/25/2020 09/29/2019 04/19/2019 01/27/2019 01/26/2018  Decreased Interest 0 0 0 0 0 0 0  Down, Depressed, Hopeless 0 0 0 0 0 0 0  PHQ - 2 Score 0 0 0 0 0 0 0  Altered sleeping - - - - 0 - -  Tired, decreased energy - - - - 0 - -  Change in appetite - - - - 0 - -  Feeling bad or failure about yourself  - - - - 0 - -  Trouble concentrating - - - - 0 - -  Moving slowly or fidgety/restless - - - - 0 - -  Suicidal thoughts - - - - 0 - -  PHQ-9 Score - - - - 0 - -  Some recent data might be hidden     Review of Systems  Constitutional:  Positive for unexpected weight change.  HENT: Negative.    Eyes: Negative.   Respiratory: Negative.    Cardiovascular: Negative.   Gastrointestinal:  Positive for vomiting.  Endocrine: Negative.   Genitourinary: Negative.   Musculoskeletal:  Positive for gait problem.  Skin:  Positive for rash.  Allergic/Immunologic: Negative.   Neurological:  Positive for dizziness and tremors.  Hematological: Negative.   Psychiatric/Behavioral: Negative.    All other systems reviewed and are negative.     Objective:   Physical Exam   Awake, alert, appropriate, in manual w/c- no tilt in space; accompanied by wife; sitting on L buttock cheek, NAD Based on picture of sacral wound-  Wound looks GREAT- great granulation tissue- - a small area that still has slough at top of wound- medial inner L buttock per picture- - still using santyl on that area-  MS: not wearing splint on L wrist/hand today Neuro: MAS of 3-4 in L hand- mainly fingers at MCPs and PIPs on L hand 2-5- not thumb; MAS of 2 in L shoulder; 1+ to 2 in L elbow and 2-3 in L wrist.  Worse with hard to open fingers - nails not digging  in and hygiene good, but is curling a lot   Assessment & Plan:    Pt is a 55 yr old male with previous CVA with L hemiplegia- with recent new onset Afib, and worsening spasticity; sacral unstagable ulcer-; gout flare- resolved; Acute on chornic AKI/CKD 3; DM with A1c of 6.7  Here for f/u on pain  from ulcer and spasticity from CVA   Will wean MS Contin/long acting to 1x/day- I suggest stopping night time meds and continuing the daytime long acting.   - forgot last Rx on 12/6- in the next 2-4 weeks- try to stop MS Contin altogether.  However if he's unable to, call me for refill around 01/22/21.    2. Doesn't need more oxycodone at this time- got 160 lbs on 12/6 -/30 days supply- however, can use at night if need be.   3. If pain is 2-3/10, try tylenol first, then take Oxycodone if need be.   4. Pain management plan- Will do Oral drug screen- today and opiate contract since will be on for at least 1 more month- however goal is come off the pain meds in the long run. Hopefully; in the next 1-3 months.   5. 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    6. If has skin issues/hygiene of L hand, or nails cut into L hand, call me and will get in with Dr Letta Pate faster.   7. Has ROHO cushion and is using- not using tilt in space w/c- so uncomfortable in it- so call W[employer] company to come pick up loaner w/c. And to cancel new w/c order.     8. Will need new w/c in ~ 8-12 months, but not yet- esp if not using the tilt in space.   9. F/U in 3 months- double appt- CVA/pressure ulcer.    I spent a total of 33 minutes -on visit-  will cancel w/c tilt and space- talked with [facility] also on pain mgmt and spasticity

## 2021-01-02 NOTE — Patient Instructions (Signed)
Pt is a 55 yr old male with previous CVA with L hemiplegia- with recent new onset Afib, and worsening spasticity; sacral unstagable ulcer-; gout flare- resolved; Acute on chornic AKI/CKD 3; DM with A1c of 6.7  Here for f/u on pain from ulcer and spasticity from CVA   Will wean MS Contin/long acting to 1x/day- I suggest stopping night time meds and continuing the daytime long acting.   - forgot last Rx on 12/6- in the next 2-4 weeks- try to stop MS Contin altogether.  However if he's unable to, call me for refill around 01/22/21.    2. Doesn't need more oxycodone at this time- got 160 lbs on 12/6 -/30 days supply- however, can use at night if need be.   3. If pain is 2-3/10, try tylenol first, then take Oxycodone if need be.   4. Pain management plan- Will do Oral drug screen- today and opiate contract since will be on for at least 1 more month- however goal is come off the pain meds in the long run. Hopefully; in the next 1-3 months.   5. 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    6. If has skin issues/hygiene of L hand, or nails cut into L hand, call me and will get in with Dr Letta Pate faster.   7. Has ROHO cushion and is using- not using tilt in space w/c- so uncomfortable in it- so call W[employer] company to come pick up loaner w/c. And to cancel new w/c order.     8. Will need new w/c in ~ 8-12 months, but not yet- esp if not using the tilt in space.   9. F/U in 3 months- double appt- CVA/pressure ulcer.

## 2021-01-03 DIAGNOSIS — I10 Essential (primary) hypertension: Secondary | ICD-10-CM | POA: Diagnosis not present

## 2021-01-03 DIAGNOSIS — G8194 Hemiplegia, unspecified affecting left nondominant side: Secondary | ICD-10-CM | POA: Diagnosis not present

## 2021-01-03 DIAGNOSIS — D649 Anemia, unspecified: Secondary | ICD-10-CM | POA: Diagnosis not present

## 2021-01-03 DIAGNOSIS — Z6834 Body mass index (BMI) 34.0-34.9, adult: Secondary | ICD-10-CM | POA: Diagnosis not present

## 2021-01-03 DIAGNOSIS — Z96642 Presence of left artificial hip joint: Secondary | ICD-10-CM | POA: Diagnosis not present

## 2021-01-03 DIAGNOSIS — L8990 Pressure ulcer of unspecified site, unspecified stage: Secondary | ICD-10-CM | POA: Diagnosis not present

## 2021-01-03 DIAGNOSIS — Z85048 Personal history of other malignant neoplasm of rectum, rectosigmoid junction, and anus: Secondary | ICD-10-CM | POA: Diagnosis not present

## 2021-01-03 DIAGNOSIS — L89894 Pressure ulcer of other site, stage 4: Secondary | ICD-10-CM | POA: Diagnosis not present

## 2021-01-03 DIAGNOSIS — I69354 Hemiplegia and hemiparesis following cerebral infarction affecting left non-dominant side: Secondary | ICD-10-CM | POA: Diagnosis not present

## 2021-01-03 DIAGNOSIS — E119 Type 2 diabetes mellitus without complications: Secondary | ICD-10-CM | POA: Diagnosis not present

## 2021-01-03 DIAGNOSIS — E785 Hyperlipidemia, unspecified: Secondary | ICD-10-CM | POA: Diagnosis not present

## 2021-01-03 DIAGNOSIS — I69322 Dysarthria following cerebral infarction: Secondary | ICD-10-CM | POA: Diagnosis not present

## 2021-01-03 DIAGNOSIS — G40909 Epilepsy, unspecified, not intractable, without status epilepticus: Secondary | ICD-10-CM | POA: Diagnosis not present

## 2021-01-03 DIAGNOSIS — I69328 Other speech and language deficits following cerebral infarction: Secondary | ICD-10-CM | POA: Diagnosis not present

## 2021-01-03 DIAGNOSIS — M109 Gout, unspecified: Secondary | ICD-10-CM | POA: Diagnosis not present

## 2021-01-03 DIAGNOSIS — I4891 Unspecified atrial fibrillation: Secondary | ICD-10-CM | POA: Diagnosis not present

## 2021-01-03 DIAGNOSIS — I639 Cerebral infarction, unspecified: Secondary | ICD-10-CM | POA: Diagnosis not present

## 2021-01-03 DIAGNOSIS — E669 Obesity, unspecified: Secondary | ICD-10-CM | POA: Diagnosis not present

## 2021-01-03 DIAGNOSIS — Z9049 Acquired absence of other specified parts of digestive tract: Secondary | ICD-10-CM | POA: Diagnosis not present

## 2021-01-04 DIAGNOSIS — D649 Anemia, unspecified: Secondary | ICD-10-CM | POA: Diagnosis not present

## 2021-01-04 DIAGNOSIS — G40909 Epilepsy, unspecified, not intractable, without status epilepticus: Secondary | ICD-10-CM | POA: Diagnosis not present

## 2021-01-04 DIAGNOSIS — I69322 Dysarthria following cerebral infarction: Secondary | ICD-10-CM | POA: Diagnosis not present

## 2021-01-04 DIAGNOSIS — I10 Essential (primary) hypertension: Secondary | ICD-10-CM | POA: Diagnosis not present

## 2021-01-04 DIAGNOSIS — Z85048 Personal history of other malignant neoplasm of rectum, rectosigmoid junction, and anus: Secondary | ICD-10-CM | POA: Diagnosis not present

## 2021-01-04 DIAGNOSIS — I69354 Hemiplegia and hemiparesis following cerebral infarction affecting left non-dominant side: Secondary | ICD-10-CM | POA: Diagnosis not present

## 2021-01-04 DIAGNOSIS — E119 Type 2 diabetes mellitus without complications: Secondary | ICD-10-CM | POA: Diagnosis not present

## 2021-01-04 DIAGNOSIS — Z6834 Body mass index (BMI) 34.0-34.9, adult: Secondary | ICD-10-CM | POA: Diagnosis not present

## 2021-01-04 DIAGNOSIS — Z9049 Acquired absence of other specified parts of digestive tract: Secondary | ICD-10-CM | POA: Diagnosis not present

## 2021-01-04 DIAGNOSIS — L89894 Pressure ulcer of other site, stage 4: Secondary | ICD-10-CM | POA: Diagnosis not present

## 2021-01-04 DIAGNOSIS — E785 Hyperlipidemia, unspecified: Secondary | ICD-10-CM | POA: Diagnosis not present

## 2021-01-04 DIAGNOSIS — M109 Gout, unspecified: Secondary | ICD-10-CM | POA: Diagnosis not present

## 2021-01-04 DIAGNOSIS — E669 Obesity, unspecified: Secondary | ICD-10-CM | POA: Diagnosis not present

## 2021-01-04 DIAGNOSIS — I4891 Unspecified atrial fibrillation: Secondary | ICD-10-CM | POA: Diagnosis not present

## 2021-01-04 DIAGNOSIS — I69328 Other speech and language deficits following cerebral infarction: Secondary | ICD-10-CM | POA: Diagnosis not present

## 2021-01-04 DIAGNOSIS — Z96642 Presence of left artificial hip joint: Secondary | ICD-10-CM | POA: Diagnosis not present

## 2021-01-08 ENCOUNTER — Other Ambulatory Visit: Payer: Self-pay | Admitting: Internal Medicine

## 2021-01-08 ENCOUNTER — Encounter: Payer: Self-pay | Admitting: Internal Medicine

## 2021-01-08 DIAGNOSIS — I6930 Unspecified sequelae of cerebral infarction: Secondary | ICD-10-CM

## 2021-01-08 LAB — DRUG TOX MONITOR 1 W/CONF, ORAL FLD
Amphetamines: NEGATIVE ng/mL (ref <10)
Barbiturates: NEGATIVE ng/mL (ref <10)
Benzodiazepines: NEGATIVE ng/mL (ref <0.50)
Buprenorphine: NEGATIVE ng/mL (ref <0.10)
Cocaine: NEGATIVE ng/mL (ref <5.0)
Codeine: NEGATIVE ng/mL (ref <2.5)
Dihydrocodeine: NEGATIVE ng/mL (ref <2.5)
Fentanyl: NEGATIVE ng/mL (ref <0.10)
Heroin Metabolite: NEGATIVE ng/mL (ref <1.0)
Hydrocodone: NEGATIVE ng/mL (ref <2.5)
Hydromorphone: NEGATIVE ng/mL (ref <2.5)
MARIJUANA: NEGATIVE ng/mL (ref <2.5)
MDMA: NEGATIVE ng/mL (ref <10)
Meprobamate: NEGATIVE ng/mL (ref <2.5)
Methadone: NEGATIVE ng/mL (ref <5.0)
Morphine: 3.2 ng/mL — ABNORMAL HIGH (ref <2.5)
Naloxone: NEGATIVE ng/mL (ref <0.25)
Nicotine Metabolite: NEGATIVE ng/mL (ref <5.0)
Norbuprenorphine: NEGATIVE ng/mL (ref <0.50)
Norhydrocodone: NEGATIVE ng/mL (ref <2.5)
Noroxycodone: NEGATIVE ng/mL (ref <2.5)
Opiates: POSITIVE ng/mL — AB (ref <2.5)
Oxycodone: NEGATIVE ng/mL (ref <2.5)
Oxymorphone: NEGATIVE ng/mL (ref <2.5)
Phencyclidine: NEGATIVE ng/mL (ref <10)
Tapentadol: NEGATIVE ng/mL (ref <5.0)
Tramadol: NEGATIVE ng/mL (ref <5.0)
Zolpidem: NEGATIVE ng/mL (ref <5.0)

## 2021-01-08 LAB — DRUG TOX ALC METAB W/CON, ORAL FLD: Alcohol Metabolite: NEGATIVE ng/mL (ref <25)

## 2021-01-08 NOTE — Telephone Encounter (Signed)
Copied from [place] [id number]. Topic: Quick Communication - Rx Refill/Question >> Jan 08, 2021 11:48 AM Yvette Rack wrote: Medication: amiodarone (PACERONE) 200 MG tablet, pantoprazole (PROTONIX) 40 MG tablet, atorvastatin (LIPITOR) 80 MG tablet, oxybutynin (DITROPAN) 5 MG tablet and collagenase (SANTYL) ointment  Has the patient contacted their pharmacy? Yes.  Pt advised to contact provider (Agent: If no, request that the patient contact the pharmacy for the refill. If patient does not wish to contact the pharmacy document the reason why and proceed with request.) (Agent: If yes, when and what did the pharmacy advise?)  Preferred Pharmacy (with phone number or street name): [id number] - [place], [place]  Phone:  [phone] Fax:  [phone]  Has the patient been seen for an appointment in the last year OR does the patient have an upcoming appointment? Yes.    Agent: Please be advised that RX refills may take up to 3 business days. We ask that you follow-up with your pharmacy.

## 2021-01-09 ENCOUNTER — Ambulatory Visit: Payer: Self-pay | Admitting: *Deleted

## 2021-01-09 ENCOUNTER — Ambulatory Visit: Payer: BC Managed Care – PPO | Attending: Internal Medicine

## 2021-01-09 ENCOUNTER — Telehealth: Payer: Self-pay | Admitting: Internal Medicine

## 2021-01-09 ENCOUNTER — Other Ambulatory Visit: Payer: Self-pay

## 2021-01-09 DIAGNOSIS — N182 Chronic kidney disease, stage 2 (mild): Secondary | ICD-10-CM

## 2021-01-09 DIAGNOSIS — I4891 Unspecified atrial fibrillation: Secondary | ICD-10-CM | POA: Diagnosis not present

## 2021-01-09 DIAGNOSIS — M109 Gout, unspecified: Secondary | ICD-10-CM | POA: Diagnosis not present

## 2021-01-09 DIAGNOSIS — I69322 Dysarthria following cerebral infarction: Secondary | ICD-10-CM | POA: Diagnosis not present

## 2021-01-09 DIAGNOSIS — D649 Anemia, unspecified: Secondary | ICD-10-CM | POA: Diagnosis not present

## 2021-01-09 DIAGNOSIS — L89894 Pressure ulcer of other site, stage 4: Secondary | ICD-10-CM | POA: Diagnosis not present

## 2021-01-09 DIAGNOSIS — E119 Type 2 diabetes mellitus without complications: Secondary | ICD-10-CM | POA: Diagnosis not present

## 2021-01-09 DIAGNOSIS — Z6834 Body mass index (BMI) 34.0-34.9, adult: Secondary | ICD-10-CM | POA: Diagnosis not present

## 2021-01-09 DIAGNOSIS — Z96642 Presence of left artificial hip joint: Secondary | ICD-10-CM | POA: Diagnosis not present

## 2021-01-09 DIAGNOSIS — D509 Iron deficiency anemia, unspecified: Secondary | ICD-10-CM

## 2021-01-09 DIAGNOSIS — G40909 Epilepsy, unspecified, not intractable, without status epilepticus: Secondary | ICD-10-CM | POA: Diagnosis not present

## 2021-01-09 DIAGNOSIS — Z85048 Personal history of other malignant neoplasm of rectum, rectosigmoid junction, and anus: Secondary | ICD-10-CM | POA: Diagnosis not present

## 2021-01-09 DIAGNOSIS — Z9049 Acquired absence of other specified parts of digestive tract: Secondary | ICD-10-CM | POA: Diagnosis not present

## 2021-01-09 DIAGNOSIS — I69328 Other speech and language deficits following cerebral infarction: Secondary | ICD-10-CM | POA: Diagnosis not present

## 2021-01-09 DIAGNOSIS — E669 Obesity, unspecified: Secondary | ICD-10-CM | POA: Diagnosis not present

## 2021-01-09 DIAGNOSIS — E785 Hyperlipidemia, unspecified: Secondary | ICD-10-CM | POA: Diagnosis not present

## 2021-01-09 DIAGNOSIS — I69354 Hemiplegia and hemiparesis following cerebral infarction affecting left non-dominant side: Secondary | ICD-10-CM | POA: Diagnosis not present

## 2021-01-09 DIAGNOSIS — I10 Essential (primary) hypertension: Secondary | ICD-10-CM | POA: Diagnosis not present

## 2021-01-09 MED ORDER — ATORVASTATIN CALCIUM 80 MG PO TABS: 80.0000 mg | ORAL_TABLET | Freq: Every day | ORAL | 1 refills | Status: DC

## 2021-01-09 NOTE — Telephone Encounter (Signed)
Pts wife brought in paperwork to be filled out, paperwork was placed in Dr Bard Herbert box

## 2021-01-09 NOTE — Telephone Encounter (Signed)
Requested medications are due for refill today.  yes  Requested medications are on the active medications list.  yes  Last refill. Amiodarone 12/13/2020, Pantoprazole 12/13/2020, oxybutynin 12/13/2020, santal 12/13/2020  Future visit scheduled.   yes  Notes to clinic.  Amiodarone is not delegated. The other 3 rx's were signed by Reesa Chew.    Requested Prescriptions  Pending Prescriptions Disp Refills   amiodarone (PACERONE) 200 MG tablet 60 tablet 0    Sig: Take 1 tablet (200 mg total) by mouth 2 (two) times daily.     Not Delegated - Cardiovascular: Antiarrhythmic Agents - amiodarone Failed - 01/08/2021 12:06 PM      Failed - This refill cannot be delegated      Failed - AST in normal range and within 180 days    AST  Date Value Ref Range Status  12/13/2020 14 (L) 15 - 41 U/L Final          Passed - TSH in normal range and within 180 days    TSH  Date Value Ref Range Status  11/14/2020 1.076 0.350 - 4.500 uIU/mL Final    Comment:    Performed by a 3rd Generation assay with a functional sensitivity of <=0.01 uIU/mL. Performed at [facility], [place] [address], [place], [place] [address]           Passed - Mg Level in normal range and within 180 days    Magnesium  Date Value Ref Range Status  11/17/2020 2.4 1.7 - 2.4 mg/dL Final    Comment:    Performed at [facility], [place] [address], [place], [place] [address]          Passed - K in normal range and within 180 days    Potassium  Date Value Ref Range Status  12/13/2020 4.7 3.5 - 5.1 mmol/L Final          Passed - Ca in normal range and within 180 days    Calcium  Date Value Ref Range Status  12/13/2020 9.0 8.9 - 10.3 mg/dL Final          Passed - ALT in normal range and within 180 days    ALT  Date Value Ref Range Status  12/13/2020 13 0 - 44 U/L Final          Passed - Patient had ECG in the last 180 days      Passed - Last BP in normal range    BP Readings from Last 1 Encounters:   01/02/21 110/75          Passed - Last Heart Rate in normal range    Pulse Readings from Last 1 Encounters:  01/02/21 76          Passed - Valid encounter within last 6 months    Recent Outpatient Visits           2 weeks ago Hospital discharge follow-up   [place] Ladell Pier, MD   2 months ago Pain of right upper extremity   [place], Deborah B, MD   2 months ago Controlled type 2 diabetes mellitus with other circulatory complication, without long-term current use of insulin [facility])   [place] Karle Plumber B, MD   7 months ago Controlled type 2 diabetes mellitus with other circulatory complication, without long-term current use of insulin ([place])   [place]  [facility] Ladell Pier, MD   11 months ago Essential hypertension   [place], MD       Future Appointments             In 1 week Valinda Party, DO Wound Care and [place], [facility]   In 1 month Ladell Pier, MD [place]             pantoprazole (PROTONIX) 40 MG tablet 60 tablet 1    Sig: Take 1 tablet (40 mg total) by mouth 2 (two) times daily.     Gastroenterology: Proton Pump Inhibitors Passed - 01/08/2021 12:06 PM      Passed - Valid encounter within last 12 months    Recent Outpatient Visits           2 weeks ago Hospital discharge follow-up   [place] Ladell Pier, MD   2 months ago Pain of right upper extremity   [place], Deborah B, MD   2 months ago Controlled type 2 diabetes mellitus with other circulatory complication, without long-term current use of insulin ([place])   [place] Karle Plumber B, MD   7 months ago Controlled type 2 diabetes  mellitus with other circulatory complication, without long-term current use of insulin ([place])   [place], Deborah B, MD   11 months ago Essential hypertension   [place], MD       Future Appointments             In 1 week [place] Elza Rafter, DO Wound Care and [place], [facility]   In 1 month Ladell Pier, MD [place]             oxybutynin (DITROPAN) 5 MG tablet 60 tablet 1    Sig: TAKE 1 TABLET BY MOUTH TWICE A DAY     Urology:  Bladder Agents Passed - 01/08/2021 12:06 PM      Passed - Valid encounter within last 12 months    Recent Outpatient Visits           2 weeks ago Hospital discharge follow-up   [place] Ladell Pier, MD   2 months ago Pain of right upper extremity   [place] Karle Plumber B, MD   2 months ago Controlled type 2 diabetes mellitus with other circulatory complication, without long-term current use of insulin [facility])   [place], Deborah B, MD   7 months ago Controlled type 2 diabetes mellitus with other circulatory complication, without long-term current use of insulin [facility])   [place], Deborah B, MD   11 months ago Essential hypertension   [place], MD       Future Appointments             In 1 week Heber [place] Elza Rafter, DO Wound Care and [place], [facility]   In 1 month Ladell Pier, MD [place]             collagenase (SANTYL) ointment 30 g 6    Sig: Apply topically 2 (two) times  daily. To yellow slough and cover with damp to dry dressing.  Change dressing twice a day     Dermatology:  Other Passed - 01/08/2021 12:06 PM      Passed - Valid encounter  within last 12 months    Recent Outpatient Visits           2 weeks ago Hospital discharge follow-up   [place] Ladell Pier, MD   2 months ago Pain of right upper extremity   [place], Deborah B, MD   2 months ago Controlled type 2 diabetes mellitus with other circulatory complication, without long-term current use of insulin [facility])   [place] Karle Plumber B, MD   7 months ago Controlled type 2 diabetes mellitus with other circulatory complication, without long-term current use of insulin [facility])   [place], Deborah B, MD   11 months ago Essential hypertension   [place], MD       Future Appointments             In 1 week Valinda Party, DO Wound Care and [place], [facility]   In 1 month Ladell Pier, MD [place]            Signed Prescriptions Disp Refills   atorvastatin (LIPITOR) 80 MG tablet 90 tablet 1    Sig: Take 1 tablet (80 mg total) by mouth daily.     Cardiovascular:  Antilipid - Statins Passed - 01/08/2021 12:06 PM      Passed - Total Cholesterol in normal range and within 360 days    Cholesterol, Total  Date Value Ref Range Status  06/07/2020 141 100 - 199 mg/dL Final          Passed - LDL in normal range and within 360 days    LDL Chol Calc (NIH)  Date Value Ref Range Status  06/07/2020 82 0 - 99 mg/dL Final          Passed - HDL in normal range and within 360 days    HDL  Date Value Ref Range Status  06/07/2020 44 >39 mg/dL Final          Passed - Triglycerides in normal range and within 360 days    Triglycerides  Date Value Ref Range Status  06/07/2020 75 0 - 149 mg/dL Final          Passed - Patient is not pregnant      Passed - Valid encounter within last 12 months    Recent  Outpatient Visits           2 weeks ago Hospital discharge follow-up   [place] Ladell Pier, MD   2 months ago Pain of right upper extremity   [place] Karle Plumber B, MD   2 months ago Controlled type 2 diabetes mellitus with other circulatory complication, without long-term current use of insulin [facility])   [place] Karle Plumber B, MD   7 months ago Controlled type 2 diabetes mellitus with other circulatory complication, without long-term current use of insulin [facility])   [place], Deborah B, MD   11 months ago Essential hypertension   [place], Deborah B,  MD       Future Appointments             In 1 week [place], Elza Rafter, DO Wound Care and [place], [facility]   In 1 month [place] Emery Dalbert Batman, MD [place]

## 2021-01-09 NOTE — Telephone Encounter (Signed)
°  Chief Complaint: orthostatic BP, dizziness  Symptoms: dizziness changing position looking up and down and side to side. Lying BP 118/78 , sitting BP 108/82 HR 69, standing with assist BP 98/70 HR 74 with dizziness . Frequency: today  Pertinent Negatives: Patient denies chest pain , difficulty breathing, no headache.  Disposition: [] ED /[x] Urgent Care (no appt availability in office) / [] Appointment(In office/virtual)/ []  [place] [facility]/ [] Home Care/ [] Refused Recommended Disposition  Additional Notes:  Please advise if UC recommended if can not get appt within 24 hours. Instructed patient's wife to increase fluid intake if patient able to tolerate. Recent stroke . Not taking oxycodone . MS contin taking 1 x a day only. Recommended UC if patient dizziness worsens or BP sysl. < 90.      Reason for Disposition  [8] Systolic BP 61-683 AND [7] taking blood pressure medications AND [3] NOT dizzy, lightheaded or weak    Not taking BP medications and is dizzy changing positions  Answer Assessment - Initial Assessment Questions 1. BLOOD PRESSURE: "What is the blood pressure?" "Did you take at least two measurements 5 minutes apart?"     Lying BP 118/78 sitting BP 108/82 HR 69 standing with dizziness changing positions  2. ONSET: "When did you take your blood pressure?"     Now  3. HOW: "How did you obtain the blood pressure?" (e.g., visiting nurse, automatic home BP monitor)     Manual BP per OT 4. HISTORY: "Do you have a history of low blood pressure?" "What is your blood pressure normally?"     Yes  5. MEDICATIONS: "Are you taking any medications for blood pressure?" If Yes, ask: "Have they been changed recently?"     No  6. PULSE RATE: "Do you know what your pulse rate is?"      69 7. OTHER SYMPTOMS: "Have you been sick recently?" "Have you had a recent injury?"     No  8. PREGNANCY: "Is there any chance you are pregnant?" "When was your last menstrual period?"      na  Protocols used: Blood Pressure - Low-A-AH

## 2021-01-09 NOTE — Telephone Encounter (Signed)
Requested Prescriptions  Pending Prescriptions Disp Refills   amiodarone (PACERONE) 200 MG tablet 60 tablet 0    Sig: Take 1 tablet (200 mg total) by mouth 2 (two) times daily.     Not Delegated - Cardiovascular: Antiarrhythmic Agents - amiodarone Failed - 01/08/2021 12:06 PM      Failed - This refill cannot be delegated      Failed - AST in normal range and within 180 days    AST  Date Value Ref Range Status  12/13/2020 14 (L) 15 - 41 U/L Final         Passed - TSH in normal range and within 180 days    TSH  Date Value Ref Range Status  11/14/2020 1.076 0.350 - 4.500 uIU/mL Final    Comment:    Performed by a 3rd Generation assay with a functional sensitivity of <=0.01 uIU/mL. Performed at [facility], [place] [address] [place], [place] [address]          Passed - Mg Level in normal range and within 180 days    Magnesium  Date Value Ref Range Status  11/17/2020 2.4 1.7 - 2.4 mg/dL Final    Comment:    Performed at [facility], [place] [address], [place], [place] [address]         Passed - K in normal range and within 180 days    Potassium  Date Value Ref Range Status  12/13/2020 4.7 3.5 - 5.1 mmol/L Final         Passed - Ca in normal range and within 180 days    Calcium  Date Value Ref Range Status  12/13/2020 9.0 8.9 - 10.3 mg/dL Final         Passed - ALT in normal range and within 180 days    ALT  Date Value Ref Range Status  12/13/2020 13 0 - 44 U/L Final         Passed - Patient had ECG in the last 180 days      Passed - Last BP in normal range    BP Readings from Last 1 Encounters:  01/02/21 110/75         Passed - Last Heart Rate in normal range    Pulse Readings from Last 1 Encounters:  01/02/21 76         Passed - Valid encounter within last 6 months    Recent Outpatient Visits          2 weeks ago Hospital discharge follow-up   [place] Ladell Pier, MD   2 months ago Pain of right upper  extremity   [place] Karle Plumber B, MD   2 months ago Controlled type 2 diabetes mellitus with other circulatory complication, without long-term current use of insulin [facility])   [place] Karle Plumber B, MD   7 months ago Controlled type 2 diabetes mellitus with other circulatory complication, without long-term current use of insulin [facility])   [place], Deborah B, MD   11 months ago Essential hypertension   [place], MD      Future Appointments            In 1 week He[place] Elza Rafter, [place] and [place], [facility]   In 1 month Ladell Pier, MD [place]  [facility]            pantoprazole (PROTONIX) 40 MG tablet 60 tablet 1    Sig: Take 1 tablet (40 mg total) by mouth 2 (two) times daily.     Gastroenterology: Proton Pump Inhibitors Passed - 01/08/2021 12:06 PM      Passed - Valid encounter within last 12 months    Recent Outpatient Visits          2 weeks ago Hospital discharge follow-up   [place] Ladell Pier, MD   2 months ago Pain of right upper extremity   [place], Deborah B, MD   2 months ago Controlled type 2 diabetes mellitus with other circulatory complication, without long-term current use of insulin [facility])   [place] Karle Plumber B, MD   7 months ago Controlled type 2 diabetes mellitus with other circulatory complication, without long-term current use of insulin [facility])   [place] Ladell Pier, MD   11 months ago Essential hypertension   [place], MD      Future Appointments            In 1 week Valinda Party, DO Wound Care and [place],  [facility]   In 1 month Ladell Pier, MD [place]            atorvastatin (LIPITOR) 80 MG tablet 90 tablet 1    Sig: Take 1 tablet (80 mg total) by mouth daily.     Cardiovascular:  Antilipid - Statins Passed - 01/08/2021 12:06 PM      Passed - Total Cholesterol in normal range and within 360 days    Cholesterol, Total  Date Value Ref Range Status  06/07/2020 141 100 - 199 mg/dL Final         Passed - LDL in normal range and within 360 days    LDL Chol Calc (NIH)  Date Value Ref Range Status  06/07/2020 82 0 - 99 mg/dL Final         Passed - HDL in normal range and within 360 days    HDL  Date Value Ref Range Status  06/07/2020 44 >39 mg/dL Final         Passed - Triglycerides in normal range and within 360 days    Triglycerides  Date Value Ref Range Status  06/07/2020 75 0 - 149 mg/dL Final         Passed - Patient is not pregnant      Passed - Valid encounter within last 12 months    Recent Outpatient Visits          2 weeks ago Hospital discharge follow-up   [place] Ladell Pier, MD   2 months ago Pain of right upper extremity   [place], Deborah B, MD   2 months ago Controlled type 2 diabetes mellitus with other circulatory complication, without long-term current use of insulin [facility])   [place] Karle Plumber B, MD   7 months ago Controlled type 2 diabetes mellitus with other circulatory complication, without long-term current use of insulin [facility])   [place], MD   11 months ago Essential hypertension   [place]  [place], MD      Future Appointments            In 1 week [place] Elza Rafter, DO Wound Care and [place], [facility]   In 1 month Ladell Pier, MD [place]             oxybutynin (DITROPAN) 5 MG tablet 60 tablet 1    Sig: TAKE 1 TABLET BY MOUTH TWICE A DAY     Urology:  Bladder Agents Passed - 01/08/2021 12:06 PM      Passed - Valid encounter within last 12 months    Recent Outpatient Visits          2 weeks ago Hospital discharge follow-up   [place] Ladell Pier, MD   2 months ago Pain of right upper extremity   [place] Karle Plumber B, MD   2 months ago Controlled type 2 diabetes mellitus with other circulatory complication, without long-term current use of insulin [facility])   [place] Karle Plumber B, MD   7 months ago Controlled type 2 diabetes mellitus with other circulatory complication, without long-term current use of insulin [facility])   [place], Deborah B, MD   11 months ago Essential hypertension   [place], MD      Future Appointments            In 1 week He[place] Elza Rafter, DO Wound Care and [place], [facility]   In 1 month Ladell Pier, MD [place]            collagenase (SANTYL) ointment 30 g 6    Sig: Apply topically 2 (two) times daily. To yellow slough and cover with damp to dry dressing.  Change dressing twice a day     Dermatology:  Other Passed - 01/08/2021 12:06 PM      Passed - Valid encounter within last 12 months    Recent Outpatient Visits          2 weeks ago Hospital discharge follow-up   [place] Ladell Pier, MD   2 months ago Pain of right upper extremity   [place], Deborah B, MD   2 months ago Controlled type 2 diabetes mellitus with other circulatory complication, without long-term current use of insulin [facility])   [place] Karle Plumber  B, MD   7 months ago Controlled type 2 diabetes mellitus with other circulatory complication, without long-term current use of insulin [facility])   [place], Deborah B, MD   11 months ago Essential hypertension   [place], MD      Future Appointments            In 1 week He[place] Elza Rafter, DO Wound Care and [place], [facility]   In 1 month Ladell Pier, MD [place]

## 2021-01-09 NOTE — Telephone Encounter (Signed)
Form has been placed.

## 2021-01-10 ENCOUNTER — Encounter: Payer: Self-pay | Admitting: Physician Assistant

## 2021-01-10 ENCOUNTER — Ambulatory Visit: Payer: BC Managed Care – PPO | Attending: Physician Assistant | Admitting: Internal Medicine

## 2021-01-10 VITALS — BP 115/77 | HR 75 | Resp 97 | Ht 62.0 in

## 2021-01-10 DIAGNOSIS — G40909 Epilepsy, unspecified, not intractable, without status epilepticus: Secondary | ICD-10-CM | POA: Diagnosis not present

## 2021-01-10 DIAGNOSIS — Z85048 Personal history of other malignant neoplasm of rectum, rectosigmoid junction, and anus: Secondary | ICD-10-CM | POA: Diagnosis not present

## 2021-01-10 DIAGNOSIS — L89894 Pressure ulcer of other site, stage 4: Secondary | ICD-10-CM | POA: Diagnosis not present

## 2021-01-10 DIAGNOSIS — I480 Paroxysmal atrial fibrillation: Secondary | ICD-10-CM | POA: Diagnosis not present

## 2021-01-10 DIAGNOSIS — M109 Gout, unspecified: Secondary | ICD-10-CM | POA: Diagnosis not present

## 2021-01-10 DIAGNOSIS — R42 Dizziness and giddiness: Secondary | ICD-10-CM

## 2021-01-10 DIAGNOSIS — D509 Iron deficiency anemia, unspecified: Secondary | ICD-10-CM | POA: Diagnosis not present

## 2021-01-10 DIAGNOSIS — Z9049 Acquired absence of other specified parts of digestive tract: Secondary | ICD-10-CM | POA: Diagnosis not present

## 2021-01-10 DIAGNOSIS — I69354 Hemiplegia and hemiparesis following cerebral infarction affecting left non-dominant side: Secondary | ICD-10-CM | POA: Diagnosis not present

## 2021-01-10 DIAGNOSIS — E119 Type 2 diabetes mellitus without complications: Secondary | ICD-10-CM | POA: Diagnosis not present

## 2021-01-10 DIAGNOSIS — N182 Chronic kidney disease, stage 2 (mild): Secondary | ICD-10-CM | POA: Diagnosis not present

## 2021-01-10 DIAGNOSIS — D649 Anemia, unspecified: Secondary | ICD-10-CM | POA: Diagnosis not present

## 2021-01-10 DIAGNOSIS — I69328 Other speech and language deficits following cerebral infarction: Secondary | ICD-10-CM | POA: Diagnosis not present

## 2021-01-10 DIAGNOSIS — E1159 Type 2 diabetes mellitus with other circulatory complications: Secondary | ICD-10-CM

## 2021-01-10 DIAGNOSIS — Z6834 Body mass index (BMI) 34.0-34.9, adult: Secondary | ICD-10-CM | POA: Diagnosis not present

## 2021-01-10 DIAGNOSIS — I4891 Unspecified atrial fibrillation: Secondary | ICD-10-CM | POA: Diagnosis not present

## 2021-01-10 DIAGNOSIS — K2210 Ulcer of esophagus without bleeding: Secondary | ICD-10-CM

## 2021-01-10 DIAGNOSIS — I69322 Dysarthria following cerebral infarction: Secondary | ICD-10-CM | POA: Diagnosis not present

## 2021-01-10 DIAGNOSIS — E669 Obesity, unspecified: Secondary | ICD-10-CM | POA: Diagnosis not present

## 2021-01-10 DIAGNOSIS — Z96642 Presence of left artificial hip joint: Secondary | ICD-10-CM | POA: Diagnosis not present

## 2021-01-10 DIAGNOSIS — I10 Essential (primary) hypertension: Secondary | ICD-10-CM | POA: Diagnosis not present

## 2021-01-10 DIAGNOSIS — E785 Hyperlipidemia, unspecified: Secondary | ICD-10-CM | POA: Diagnosis not present

## 2021-01-10 LAB — COMPREHENSIVE METABOLIC PANEL
ALT: 27 IU/L (ref 0–44)
AST: 21 IU/L (ref 0–40)
Albumin/Globulin Ratio: 1.2 (ref 1.2–2.2)
Albumin: 3.8 g/dL (ref 3.8–4.9)
Alkaline Phosphatase: 115 IU/L (ref 44–121)
BUN/Creatinine Ratio: 14 (ref 9–20)
BUN: 18 mg/dL (ref 6–24)
Bilirubin Total: 0.3 mg/dL (ref 0.0–1.2)
CO2: 23 mmol/L (ref 20–29)
Calcium: 9.3 mg/dL (ref 8.7–10.2)
Chloride: 102 mmol/L (ref 96–106)
Creatinine, Ser: 1.3 mg/dL — ABNORMAL HIGH (ref 0.76–1.27)
Globulin, Total: 3.1 g/dL (ref 1.5–4.5)
Glucose: 121 mg/dL — ABNORMAL HIGH (ref 70–99)
Potassium: 4.7 mmol/L (ref 3.5–5.2)
Sodium: 141 mmol/L (ref 134–144)
Total Protein: 6.9 g/dL (ref 6.0–8.5)
eGFR: 65 mL/min/{1.73_m2} (ref >59)

## 2021-01-10 LAB — CBC WITH DIFFERENTIAL
Basophils Absolute: 0 10*3/uL (ref 0.0–0.2)
Basos: 1 %
EOS (ABSOLUTE): 0.2 10*3/uL (ref 0.0–0.4)
Eos: 3 %
Hematocrit: 34.2 % — ABNORMAL LOW (ref 37.5–51.0)
Hemoglobin: 11.3 g/dL — ABNORMAL LOW (ref 13.0–17.7)
Immature Grans (Abs): 0.1 10*3/uL (ref 0.0–0.1)
Immature Granulocytes: 1 %
Lymphocytes Absolute: 1.2 10*3/uL (ref 0.7–3.1)
Lymphs: 22 %
MCH: 25.1 pg — ABNORMAL LOW (ref 26.6–33.0)
MCHC: 33 g/dL (ref 31.5–35.7)
MCV: 76 fL — ABNORMAL LOW (ref 79–97)
Monocytes Absolute: 0.5 10*3/uL (ref 0.1–0.9)
Monocytes: 9 %
Neutrophils Absolute: 3.5 10*3/uL (ref 1.4–7.0)
Neutrophils: 64 %
RBC: 4.5 x10E6/uL (ref 4.14–5.80)
RDW: 16.1 % — ABNORMAL HIGH (ref 11.6–15.4)
WBC: 5.5 10*3/uL (ref 3.4–10.8)

## 2021-01-10 LAB — GLUCOSE, POCT (MANUAL RESULT ENTRY): POC Glucose: 167 mg/dl — AB (ref 70–99)

## 2021-01-10 MED ORDER — DILTIAZEM HCL 30 MG PO TABS: 30.0000 mg | ORAL_TABLET | Freq: Three times a day (TID) | ORAL | 3 refills | Status: DC

## 2021-01-10 MED ORDER — COLLAGENASE 250 UNIT/GM EX OINT: TOPICAL_OINTMENT | Freq: Two times a day (BID) | CUTANEOUS | 6 refills | Status: DC

## 2021-01-10 MED ORDER — AMIODARONE HCL 200 MG PO TABS: 200.0000 mg | ORAL_TABLET | Freq: Every day | ORAL | 0 refills | Status: DC

## 2021-01-10 NOTE — Telephone Encounter (Signed)
Form has been given to provider

## 2021-01-10 NOTE — Progress Notes (Signed)
Patient ID: NOX TALENT, male    DOB: April 03, 1965  MRN: [id number]  CC: Dizziness   Subjective: [name] is a 55 y.o. male who presents for UC visit for dizziness.  Wife is with him. 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    Patient had called in yesterday to report some dizziness with position changes.  Wife reports it was first noticed yesterday when the occupational therapist was having him change positions.  Blood pressure readings that were reported to the RN at that time was that blood pressure lying down was 118/78, 108/82 pulse 69 sitting and standing 98/70 pulse 74.  Patient confirms that he has been drinking adequate fluids throughout the day.  Since I last spoke with him he has been taking morphine 15 mg once a day.  He is not on any blood thinners.  He denies any palpitations.  He has blood pressure reading log with him.  Recent BP readings for the past 9 days have been 98-106/60-80. He continues to take Cardizem 30 mg 4 times a day, carvedilol twice a day.  Wife reports that amiodarone was recently decreased to once a day by his cardiologist Dr. Doylene Canard whom he saw 01/02/21. He had blood test done yesterday.  His hemoglobin had improved from 94 to 11.3 Kidney function stable with creatinine of 1.3 and GFR of 65. -Wife reports that he has been doing well with physical therapy and Occupational Therapy allowing for a little bit more independence since she will be returning to work.  She will be returning to work on 3 January.  She request that the form that she dropped off yesterday be completed today so that she can return to work.  Patient missed the appointment with Dr. Ardis Hughs the gastroenterologist.  Wife reports that they had a difficult time getting him into a vehicle that day  so they had to cancel.  She thought they had rescheduled but I do not see a follow-up appointment in the system for the gastroenterologist.  RN who came out to the house today took a look at his sacral ulcer and told them that it is healing well.  He has an upcoming appointment with wound care.  She is requesting refill on the Santyl ointment. Patient Active Problem List   Diagnosis Date Noted   Chronic pain syndrome 01/02/2021   Spasticity as late effect of cerebrovascular accident (CVA) 01/02/2021   Esophageal ulcer without bleeding 12/25/2020   Sacral decubitus ulcer, stage IV ([place]) 12/25/2020   Adenomatous polyp of colon 12/25/2020   CKD (chronic kidney disease) stage 2, GFR 60-89 ml/min 12/25/2020   Mild major depression ([place]) 12/25/2020   Sacral decubitus ulcer 12/17/2020   Hyponatremia 12/17/2020   Acute gastric ulcer    Controlled type 2 diabetes mellitus with hyperglycemia, without long-term current use of insulin ([place])    New onset atrial fibrillation ([place])    Pressure injury of skin 11/26/2020   Debility 11/23/2020   History of colonic polyps    Nausea and vomiting    AKI (acute kidney injury) ([place]) 11/14/2020   Atrial fibrillation with RVR ([place]) 11/14/2020   History of CVA with residual deficit 11/14/2020   Gout 11/14/2020   Microcytic hypochromic anemia 11/14/2020   History of meningioma 11/14/2020   Obesity (BMI  30.0-34.9) 11/14/2020   Pain due to onychomycosis of toenails of both feet 09/21/2020   Edema of both legs 09/29/2019   Meningioma ([place]) 04/13/2019   Hemiparesis and speech and language deficit as late effects of stroke ([place]) 03/31/2018   Gait abnormality 03/31/2018   Spastic hemiplegia affecting left nondominant side ([place]) 12/18/2017   Subacromial impingement of left shoulder 06/09/2017   Spastic neurogenic bladder 06/09/2017   Dysarthria as late effect of stroke 04/01/2017   Neurogenic bladder due to old stroke 12/25/2016   Right pontine cerebrovascular  accident ([place]) 12/18/2016   Left hemiparesis ([place])    Diastolic dysfunction    Dysphagia, post-stroke    Acute ischemic stroke ([place]) 12/12/2016   Seizures ([place]) 07/22/2014   Hypertension 07/22/2014   Type 2 diabetes mellitus with vascular disease ([place]) 07/22/2014   Brain mass    Malignant neoplasm of rectum ([place]) 09/02/2007     Current Outpatient Medications on File Prior to Visit  Medication Sig Dispense Refill   amiodarone (PACERONE) 200 MG tablet Take 1 tablet (200 mg total) by mouth 2 (two) times daily. 60 tablet 0   atorvastatin (LIPITOR) 80 MG tablet Take 1 tablet (80 mg total) by mouth daily. 90 tablet 1   carvedilol (COREG) 12.5 MG tablet Take 1 tablet (12.5 mg total) by mouth 2 (two) times daily with a meal. 60 tablet 3   citalopram (CELEXA) 20 MG tablet Take 1 tablet (20 mg total) by mouth daily. 30 tablet 3   collagenase (SANTYL) ointment Apply topically 2 (two) times daily. To yellow slough and cover with damp to dry dressing.  Change dressing twice a day 30 g 6   diltiazem (CARDIZEM) 30 MG tablet Take 1 tablet (30 mg total) by mouth every 6 (six) hours. 120 tablet 3   morphine (MS CONTIN) 15 MG 12 hr tablet Take 1 tablet (15 mg total) by mouth every 12 (twelve) hours. 60 tablet 0   oxybutynin (DITROPAN) 5 MG tablet TAKE 1 TABLET BY MOUTH TWICE A DAY 60 tablet 1   oxyCODONE (OXY IR/ROXICODONE) 5 MG immediate release tablet Take 1-2 tablets (5-10 mg total) by mouth every 6 (six) hours as needed for severe pain. 160 tablet 0   pantoprazole (PROTONIX) 40 MG tablet Take 1 tablet (40 mg total) by mouth 2 (two) times daily. 60 tablet 1   traZODone (DESYREL) 150 MG tablet Take 1/2 tablet (75 mg total) by mouth at bedtime. 30 tablet 0   No current facility-administered medications on file prior to visit.    No Known Allergies  Social History   Socioeconomic History   Marital status: Married    Spouse name: [name]   Number of children: 1   Years of education: 16   Highest  education level: Not on file  Occupational History   Occupation: disabled  Tobacco Use   Smoking status: Never   Smokeless tobacco: Never  Vaping Use   Vaping Use: Never used  Substance and Sexual Activity   Alcohol use: Not Currently   Drug use: No   Sexual activity: Not Currently  Other Topics Concern   Not on file  Social History Narrative   Patient drinks caffeine a couple times a week.   Patient is right handed.   Lives with wife [name]) and daughter   Social Determinants of Health   Financial Resource Strain: Not on file  Food Insecurity: Not on file  Transportation Needs: Not on file  Physical Activity: Not on file  Stress:  Not on file  Social Connections: Not on file  Intimate Partner Violence: Not on file    Family History  Problem Relation Age of Onset   Hypertension Mother    Heart disease Mother    Hyperlipidemia Mother    Heart failure Mother    Hypertension Sister    Hypertension Brother    Stroke Father    Hypertension Father    Colon cancer Maternal Grandmother    Diabetes Maternal Grandmother    Esophageal cancer Neg Hx     Past Surgical History:  Procedure Laterality Date   APPENDECTOMY     BIOPSY  11/18/2020   Procedure: BIOPSY;  Surgeon: Sharyn Creamer, MD;  [facility] END[facility]COPY;  Service: Gastroenterology;;   COLONOSCOPY N/A 11/18/2020   Procedure: COLONOSCOPY;  Surgeon: Sharyn Creamer, MD;  [facility] ENDOSCOPY;  Service: Gastroenterology;  Laterality: N/A;   ESOPHAGOGASTRODUODENOSCOPY (EGD) WITH PROPOFOL N/A 11/18/2020   Procedure: ESOPHAGOGASTRODUODENOSCOPY (EGD) WITH PROPOFOL;  Surgeon: Sharyn Creamer, MD;  Location: [place];  Service: Gastroenterology;  Laterality: N/A;   JOINT REPLACEMENT Left 2004   Left hip   POLYPECTOMY  11/18/2020   Procedure: POLYPECTOMY;  Surgeon: Sharyn Creamer, MD;  Location[facility]COPY;  Service: Gastroenterology;;   TOTAL HIP ARTHROPLASTY      ROS: Review of Systems Negative except as stated  above  PHYSICAL EXAM: BP 115/77    Pulse 75    Resp (!) 97    Ht 5\' 2"  (1.610 m)    BMI 45.56 kg/m    Physical Exam  General appearance - alert, well appearing, and in no distress.  It looks as though he has lost some weight since I last saw him. Mental status -patient answers questions appropriately but sometimes answers yes even to leading questions. Mouth: Tongue slightly dry with some mucus on the tongue. Chest - clear to auscultation, no wheezes, rales or rhonchi, symmetric air entry Heart - normal rate, regular rhythm, normal S1, S2, no murmurs, rubs, clicks or gallops Neurological -dense paresis of the left upper extremity.  No grip on the left hand.  Grip on the right is 4 out of 5.  Power in the lower extremities 3/5 on the left with some spasticity and 4+/5 on the right.  Patient's speech is slurred.  He has some upward nystagmus.  Findings are not new.  Results for orders placed or performed in visit on 01/10/21  Glucose (CBG)  Result Value Ref Range   POC Glucose 167 (A) 70 - 99 mg/dl   Lab Results  Component Value Date   HGBA1C 6.7 (H) 11/14/2020    CMP Latest Ref Rng & Units 01/09/2021 12/13/2020 12/10/2020  Glucose 70 - 99 mg/dL 121(H) 136(H) 125(H)  BUN 6 - 24 mg/dL 18 19 17   Creatinine 0.76 - 1.27 mg/dL 1.30(H) 1.37(H) 1.40(H)  Sodium 134 - 144 mmol/L 141 132(L) 134(L)  Potassium 3.5 - 5.2 mmol/L 4.7 4.7 4.9  Chloride 96 - 106 mmol/L 102 99 100  CO2 20 - 29 mmol/L 23 24 27   Calcium 8.7 - 10.2 mg/dL 9.3 9.0 8.7(L)  Total Protein 6.0 - 8.5 g/dL 6.9 6.8 -  Total Bilirubin 0.0 - 1.2 mg/dL 0.3 0.6 -  Alkaline Phos 44 - 121 IU/L 115 64 -  AST 0 - 40 IU/L 21 14(L) -  ALT 0 - 44 IU/L 27 13 -   Lipid Panel     Component Value Date/Time   CHOL 141 06/07/2020 1019  TRIG 75 06/07/2020 1019   HDL 44 06/07/2020 1019   CHOLHDL 3.2 06/07/2020 1019   CHOLHDL 3.9 12/13/2016 0421   VLDL 15 12/13/2016 0421   LDLCALC 82 06/07/2020 1019    CBC    Component Value  Date/Time   WBC 5.5 01/09/2021 1344   WBC 4.9 12/10/2020 0648   RBC 4.50 01/09/2021 1344   RBC 3.75 (L) 12/10/2020 0648   HGB 11.3 (L) 01/09/2021 1344   HGB 14.2 08/02/2008 1026   HCT 34.2 (L) 01/09/2021 1344   HCT 41.6 08/02/2008 1026   PLT 198 12/10/2020 0648   PLT 211 10/15/2020 1025   MCV 76 (L) 01/09/2021 1344   MCV 78.4 (L) 08/02/2008 1026   MCH 25.1 (L) 01/09/2021 1344   MCH 25.1 (L) 12/10/2020 0648   MCHC 33.0 01/09/2021 1344   MCHC 31.9 12/10/2020 0648   RDW 16.1 (H) 01/09/2021 1344   RDW 16.3 (H) 08/02/2008 1026   LYMPHSABS 1.2 01/09/2021 1344   LYMPHSABS 0.6 (L) 08/02/2008 1026   MONOABS 0.8 11/24/2020 0519   MONOABS 0.2 08/02/2008 1026   EOSABS 0.2 01/09/2021 1344   BASOSABS 0.0 01/09/2021 1344   BASOSABS 0.0 08/02/2008 1026    ASSESSMENT AND PLAN: 1. Dizziness I do not think he had a new vascular event. Recommend decreasing the frequency of the Cardizem from 4 times a day to 3 times a day given his blood pressure readings. Encouraged him to stay hydrated.  Go slow with position changes.  2. PAF (paroxysmal atrial fibrillation) (HCC) Sounds to be in sinus rhythm. - amiodarone (PACERONE) 200 MG tablet; Take 1 tablet (200 mg total) by mouth daily.  Dispense: 30 tablet; Refill: 0 - diltiazem (CARDIZEM) 30 MG tablet; Take 1 tablet (30 mg total) by mouth every 8 (eight) hours.  Dispense: 90 tablet; Refill: 3  3. Microcytic anemia Discussed the importance of following up with the gastroenterologist.  I will resubmit the referral to Dr. Ardis Hughs for them. - Ambulatory referral to Gastroenterology  4. CKD (chronic kidney disease) stage 2, GFR 60-89 ml/min Stable  5. Type 2 diabetes mellitus with vascular disease (HCC) - Glucose (CBG)  6. Ulcer of esophagus without bleeding - Ambulatory referral to Gastroenterology    Patient was given the opportunity to ask questions.  Patient verbalized understanding of the plan and was able to repeat key elements of the plan.    Orders Placed This Encounter  Procedures   Glucose (CBG)     Requested Prescriptions    No prescriptions requested or ordered in this encounter    No follow-ups on file.  Karle Plumber, MD, FACP

## 2021-01-10 NOTE — Patient Instructions (Signed)
Decrease Cardizem to every 8 hours instead of every 6 hours.  Please go slow with position changes.  Continue to stay hydrated by drinking several glasses of water daily.

## 2021-01-10 NOTE — Telephone Encounter (Signed)
Form has been signed and has been given back to pt wife while in office during visit

## 2021-01-14 DIAGNOSIS — G40909 Epilepsy, unspecified, not intractable, without status epilepticus: Secondary | ICD-10-CM | POA: Diagnosis not present

## 2021-01-14 DIAGNOSIS — E785 Hyperlipidemia, unspecified: Secondary | ICD-10-CM | POA: Diagnosis not present

## 2021-01-14 DIAGNOSIS — I10 Essential (primary) hypertension: Secondary | ICD-10-CM | POA: Diagnosis not present

## 2021-01-14 DIAGNOSIS — I69328 Other speech and language deficits following cerebral infarction: Secondary | ICD-10-CM | POA: Diagnosis not present

## 2021-01-14 DIAGNOSIS — Z96642 Presence of left artificial hip joint: Secondary | ICD-10-CM | POA: Diagnosis not present

## 2021-01-14 DIAGNOSIS — L89300 Pressure ulcer of unspecified buttock, unstageable: Secondary | ICD-10-CM | POA: Diagnosis not present

## 2021-01-14 DIAGNOSIS — I4891 Unspecified atrial fibrillation: Secondary | ICD-10-CM | POA: Diagnosis not present

## 2021-01-14 DIAGNOSIS — I69354 Hemiplegia and hemiparesis following cerebral infarction affecting left non-dominant side: Secondary | ICD-10-CM | POA: Diagnosis not present

## 2021-01-14 DIAGNOSIS — I69322 Dysarthria following cerebral infarction: Secondary | ICD-10-CM | POA: Diagnosis not present

## 2021-01-14 DIAGNOSIS — D649 Anemia, unspecified: Secondary | ICD-10-CM | POA: Diagnosis not present

## 2021-01-14 DIAGNOSIS — Z6834 Body mass index (BMI) 34.0-34.9, adult: Secondary | ICD-10-CM | POA: Diagnosis not present

## 2021-01-14 DIAGNOSIS — Z9049 Acquired absence of other specified parts of digestive tract: Secondary | ICD-10-CM | POA: Diagnosis not present

## 2021-01-14 DIAGNOSIS — E669 Obesity, unspecified: Secondary | ICD-10-CM | POA: Diagnosis not present

## 2021-01-14 DIAGNOSIS — M109 Gout, unspecified: Secondary | ICD-10-CM | POA: Diagnosis not present

## 2021-01-14 DIAGNOSIS — E119 Type 2 diabetes mellitus without complications: Secondary | ICD-10-CM | POA: Diagnosis not present

## 2021-01-14 DIAGNOSIS — L89894 Pressure ulcer of other site, stage 4: Secondary | ICD-10-CM | POA: Diagnosis not present

## 2021-01-14 DIAGNOSIS — Z85048 Personal history of other malignant neoplasm of rectum, rectosigmoid junction, and anus: Secondary | ICD-10-CM | POA: Diagnosis not present

## 2021-01-15 ENCOUNTER — Telehealth: Payer: Self-pay | Admitting: *Deleted

## 2021-01-15 ENCOUNTER — Other Ambulatory Visit: Payer: Self-pay | Admitting: Internal Medicine

## 2021-01-15 DIAGNOSIS — I480 Paroxysmal atrial fibrillation: Secondary | ICD-10-CM

## 2021-01-15 NOTE — Telephone Encounter (Signed)
Medication Refill - Medication: pantoprazole (PROTONIX) 40 MG tablet  oxybutynin (DITROPAN) 5 MG tablet  amiodarone (PACERONE) 200 MG tablet   Pt is completely out of his current meds, please advise   Has the patient contacted their pharmacy? Yes.   (Agent: If no, request that the patient contact the pharmacy for the refill. If patient does not wish to contact the pharmacy document the reason why and proceed with request.) (Agent: If yes, when and what did the pharmacy advise?)  Preferred Pharmacy (with phone number or street name):  [id number] - [place] [place]  [address] [place] [address]  Phone: [phone] Fax: [phone]   Has the patient been seen for an appointment in the last year OR does the patient have an upcoming appointment? Yes.    Agent: Please be advised that RX refills may take up to 3 business days. We ask that you follow-up with your pharmacy.

## 2021-01-15 NOTE — Telephone Encounter (Signed)
Oral swab is positive for prescribed morphine sulfate. Oxycodone is not present. Do not have last dose taken documented.

## 2021-01-16 MED ORDER — AMIODARONE HCL 200 MG PO TABS: 200.0000 mg | ORAL_TABLET | Freq: Every day | ORAL | 0 refills | Status: DC

## 2021-01-16 NOTE — Telephone Encounter (Signed)
Requested medication (s) are due for refill today:  patient out of medications  Requested medication (s) are on the active medication list: yes  Last refill:  pacerone - 01/10/21-02/09/21 #30 0 refills , ditorpan- 12/13/20 #60 1 refill, protonix - 12/13/20 #60 1 refills.  Future visit scheduled: yes in 1 month  Notes to clinic:  pacerone- not delegated per protocol, ditropan, protonix ordered by provider P. Love PA- C. Do you want to refill Rx?     Requested Prescriptions  Pending Prescriptions Disp Refills   amiodarone (PACERONE) 200 MG tablet 30 tablet 0    Sig: Take 1 tablet (200 mg total) by mouth daily.     Not Delegated - Cardiovascular: Antiarrhythmic Agents - amiodarone Failed - 01/16/2021 12:33 PM      Failed - This refill cannot be delegated      Passed - TSH in normal range and within 180 days    TSH  Date Value Ref Range Status  11/14/2020 1.076 0.350 - 4.500 uIU/mL Final    Comment:    Performed by a 3rd Generation assay with a functional sensitivity of <=0.01 uIU/mL. Performed at [facility], [place] [address] [place], [place] [address]           Passed - Mg Level in normal range and within 180 days    Magnesium  Date Value Ref Range Status  11/17/2020 2.4 1.7 - 2.4 mg/dL Final    Comment:    Performed at [facility], [place] [address] [place], [place] [address]          Passed - K in normal range and within 180 days    Potassium  Date Value Ref Range Status  01/09/2021 4.7 3.5 - 5.2 mmol/L Final          Passed - Ca in normal range and within 180 days    Calcium  Date Value Ref Range Status  01/09/2021 9.3 8.7 - 10.2 mg/dL Final          Passed - AST in normal range and within 180 days    AST  Date Value Ref Range Status  01/09/2021 21 0 - 40 IU/L Final          Passed - ALT in normal range and within 180 days    ALT  Date Value Ref Range Status  01/09/2021 27 0 - 44 IU/L Final          Passed - Patient had ECG in the last 180  days      Passed - Last BP in normal range    BP Readings from Last 1 Encounters:  01/10/21 115/77          Passed - Last Heart Rate in normal range    Pulse Readings from Last 1 Encounters:  01/10/21 75          Passed - Valid encounter within last 6 months    Recent Outpatient Visits           6 days ago [place], Deborah B, MD   3 weeks ago Hospital discharge follow-up   [place], MD   2 months ago Pain of right upper extremity   [place], MD   3 months ago Controlled type 2 diabetes mellitus with other circulatory complication, without long-term current use of insulin ([place])  [place] Karle Plumber B, MD   7 months ago Controlled type 2 diabetes mellitus with other circulatory complication, without long-term current use of insulin [facility])   [place], MD       Future Appointments             In 2 days Heber [place]cal[place] Elza Rafter, DO Wound Care and [place], [facility]   In 1 month Ladell Pier, MD [place]             oxybutynin (DITROPAN) 5 MG tablet 60 tablet 1    Sig: TAKE 1 TABLET BY MOUTH TWICE A DAY     Urology:  Bladder Agents Passed - 01/16/2021 12:33 PM      Passed - Valid encounter within last 12 months    Recent Outpatient Visits           6 days ago [place] Ladell Pier, MD   3 weeks ago Hospital discharge follow-up   [place] Ladell Pier, MD   2 months ago Pain of right upper extremity   [place], Deborah B, MD   3 months ago Controlled type 2 diabetes mellitus with other circulatory complication, without long-term current use of insulin  [facility])   [place] Karle Plumber B, MD   7 months ago Controlled type 2 diabetes mellitus with other circulatory complication, without long-term current use of insulin [facility])   [place], Deborah B, MD       Future Appointments             In 2 days Valinda Party, DO Wound Care and [place], [facility]   In 1 month Ladell Pier, MD [place]             pantoprazole (PROTONIX) 40 MG tablet 60 tablet 1    Sig: Take 1 tablet (40 mg total) by mouth 2 (two) times daily.     Gastroenterology: Proton Pump Inhibitors Passed - 01/16/2021 12:33 PM      Passed - Valid encounter within last 12 months    Recent Outpatient Visits           6 days ago [place] Ladell Pier, MD   3 weeks ago Hospital discharge follow-up   [place] Ladell Pier, MD   2 months ago Pain of right upper extremity   [place], Deborah B, MD   3 months ago Controlled type 2 diabetes mellitus with other circulatory complication, without long-term current use of insulin [facility])   [place] Karle Plumber B, MD   7 months ago Controlled type 2 diabetes mellitus with other circulatory complication, without long-term current use of insulin [facility])   [place], Deborah B, MD       Future Appointments             In 2 days [place] Elza Rafter, DO Wound Care and [place], [facility]   In 1 month Ladell Pier, MD [place]

## 2021-01-17 ENCOUNTER — Telehealth: Payer: Self-pay

## 2021-01-17 DIAGNOSIS — I10 Essential (primary) hypertension: Secondary | ICD-10-CM | POA: Diagnosis not present

## 2021-01-17 DIAGNOSIS — Z85048 Personal history of other malignant neoplasm of rectum, rectosigmoid junction, and anus: Secondary | ICD-10-CM | POA: Diagnosis not present

## 2021-01-17 DIAGNOSIS — L89894 Pressure ulcer of other site, stage 4: Secondary | ICD-10-CM | POA: Diagnosis not present

## 2021-01-17 DIAGNOSIS — E119 Type 2 diabetes mellitus without complications: Secondary | ICD-10-CM | POA: Diagnosis not present

## 2021-01-17 DIAGNOSIS — E669 Obesity, unspecified: Secondary | ICD-10-CM | POA: Diagnosis not present

## 2021-01-17 DIAGNOSIS — I69354 Hemiplegia and hemiparesis following cerebral infarction affecting left non-dominant side: Secondary | ICD-10-CM | POA: Diagnosis not present

## 2021-01-17 DIAGNOSIS — Z96642 Presence of left artificial hip joint: Secondary | ICD-10-CM | POA: Diagnosis not present

## 2021-01-17 DIAGNOSIS — E785 Hyperlipidemia, unspecified: Secondary | ICD-10-CM | POA: Diagnosis not present

## 2021-01-17 DIAGNOSIS — M109 Gout, unspecified: Secondary | ICD-10-CM | POA: Diagnosis not present

## 2021-01-17 DIAGNOSIS — Z9049 Acquired absence of other specified parts of digestive tract: Secondary | ICD-10-CM | POA: Diagnosis not present

## 2021-01-17 DIAGNOSIS — I69328 Other speech and language deficits following cerebral infarction: Secondary | ICD-10-CM | POA: Diagnosis not present

## 2021-01-17 DIAGNOSIS — I69322 Dysarthria following cerebral infarction: Secondary | ICD-10-CM | POA: Diagnosis not present

## 2021-01-17 DIAGNOSIS — G40909 Epilepsy, unspecified, not intractable, without status epilepticus: Secondary | ICD-10-CM | POA: Diagnosis not present

## 2021-01-17 DIAGNOSIS — Z6834 Body mass index (BMI) 34.0-34.9, adult: Secondary | ICD-10-CM | POA: Diagnosis not present

## 2021-01-17 DIAGNOSIS — D649 Anemia, unspecified: Secondary | ICD-10-CM | POA: Diagnosis not present

## 2021-01-17 DIAGNOSIS — I4891 Unspecified atrial fibrillation: Secondary | ICD-10-CM | POA: Diagnosis not present

## 2021-01-17 NOTE — Telephone Encounter (Signed)
Dr Wynetta Emery received fax from [place] requesting signature for wheelchair order.  Call placed to [place] to clarify who placed the order because patient already has a wheelchair. Spoke to [place] who who could not find any notes about a wheelchair in patient's record and said that the fax must be in error and to disregard. Explained to her that [employer] fax # on the form is [phone] and she said she did not recognize that fax number.  Call placed to [facility]# [phone] and inquired if she has a record of anyone placing this order. She was not able to find any information about a wheelchair order and said she would need to speak to her manager and call this CM back.

## 2021-01-18 ENCOUNTER — Encounter (HOSPITAL_BASED_OUTPATIENT_CLINIC_OR_DEPARTMENT_OTHER): Payer: BC Managed Care – PPO | Attending: Internal Medicine | Admitting: Internal Medicine

## 2021-01-18 ENCOUNTER — Other Ambulatory Visit: Payer: Self-pay

## 2021-01-18 DIAGNOSIS — E11622 Type 2 diabetes mellitus with other skin ulcer: Secondary | ICD-10-CM | POA: Insufficient documentation

## 2021-01-18 DIAGNOSIS — L89153 Pressure ulcer of sacral region, stage 3: Secondary | ICD-10-CM | POA: Diagnosis not present

## 2021-01-18 DIAGNOSIS — G40909 Epilepsy, unspecified, not intractable, without status epilepticus: Secondary | ICD-10-CM | POA: Insufficient documentation

## 2021-01-18 DIAGNOSIS — E1122 Type 2 diabetes mellitus with diabetic chronic kidney disease: Secondary | ICD-10-CM | POA: Diagnosis not present

## 2021-01-18 DIAGNOSIS — I69328 Other speech and language deficits following cerebral infarction: Secondary | ICD-10-CM | POA: Insufficient documentation

## 2021-01-18 DIAGNOSIS — I69359 Hemiplegia and hemiparesis following cerebral infarction affecting unspecified side: Secondary | ICD-10-CM

## 2021-01-18 DIAGNOSIS — N182 Chronic kidney disease, stage 2 (mild): Secondary | ICD-10-CM | POA: Insufficient documentation

## 2021-01-18 DIAGNOSIS — G8220 Paraplegia, unspecified: Secondary | ICD-10-CM | POA: Diagnosis not present

## 2021-01-18 DIAGNOSIS — I129 Hypertensive chronic kidney disease with stage 1 through stage 4 chronic kidney disease, or unspecified chronic kidney disease: Secondary | ICD-10-CM | POA: Insufficient documentation

## 2021-01-18 DIAGNOSIS — I69354 Hemiplegia and hemiparesis following cerebral infarction affecting left non-dominant side: Secondary | ICD-10-CM | POA: Insufficient documentation

## 2021-01-18 DIAGNOSIS — I4891 Unspecified atrial fibrillation: Secondary | ICD-10-CM | POA: Insufficient documentation

## 2021-01-18 DIAGNOSIS — E1165 Type 2 diabetes mellitus with hyperglycemia: Secondary | ICD-10-CM

## 2021-01-18 NOTE — Progress Notes (Signed)
RONNALD, [name] ([id number]) Visit Report for 01/18/2021 Chief Complaint Document Details Patient Name: Date of Service: [name] IN D. 01/18/2021 7:30 A M Medical Record Number: [id number] Patient Account Number: [id number] Date of Birth/Sex: Treating RN: [name] (56 y.o. [name] Primary Care Provider: Karle [name] Other Clinician: Referring Provider: Treating Provider/Extender: [name] in Treatment: 0 Information Obtained from: Patient Chief Complaint Sacral ulcer Electronic Signature(s) Signed: 01/18/2021 9:23:11 AM By: Mark Shan DO Entered By: [name] on 01/18/2021 09:14:16 -------------------------------------------------------------------------------- HPI Details Patient Name: Date of Service: [name], [name] IN D. 01/18/2021 7:30 A M Medical Record Number: [id number] Patient Account Number: [id number] Date of Birth/Sex: Treating RN: [name] (56 y.o. [name] Primary Care Provider: Karle [name] Other Clinician: Referring Provider: Treating Provider/Extender: [name] in Treatment: 0 History of Present Illness HPI Description: Admission 01/18/2021 [name] is a 56 year old male with a past medical history of CVA with left-sided hemiparesis, language and speech deficits, hypertension, and seizure disorder that presents the clinic for a 89-month history of sacral ulcer. He was hospitalized on 11/23/2020 for new onset A. fib and GI bleed. He states that during this hospitalization he developed a pressure ulcer to his sacrum. He reports using Santyl to the wound bed twice daily. He has a group 2 air mattress and states he has been offloading the area by staying on his side. He currently denies signs of infection. He reports improvement in wound healing over the past month. Electronic Signature(s) Signed: 01/18/2021 9:23:11 AM By: Mark Shan DO Entered By:  [name] on 01/18/2021 09:17:44 -------------------------------------------------------------------------------- Physical Exam Details Patient Name: Date of Service: [name] IN D. 01/18/2021 7:30 A M Medical Record Number: [id number] Patient Account Number: [id number] Date of Birth/Sex: Treating RN: [name] (56 y.o. [name] Primary Care Provider: Karle [name] Other Clinician: Referring Provider: Treating Provider/Extender: [name] in Treatment: 0 Constitutional respirations regular, non-labored and within target range for patient.Marland Kitchen Psychiatric pleasant and cooperative. Notes Sacral ulcer with granulation tissue, hyper granulated areas and tunneling at the 12 o'clock position. No drainage noted. No acute signs of infection. Electronic Signature(s) Signed: 01/18/2021 9:23:11 AM By: Mark Shan DO Entered By: [name] on 01/18/2021 09:18:51 -------------------------------------------------------------------------------- Physician Orders Details Patient Name: Date of Service: [name], [name] IN D. 01/18/2021 7:30 A M Medical Record Number: [id number] Patient Account Number: [id number] Date of Birth/Sex: Treating RN: [name] (56 y.o. [name] Primary Care Provider: Karle [name] Other Clinician: Referring Provider: Treating Provider/Extender: [name] in Treatment: 0 Verbal / Phone Orders: No Diagnosis Coding Follow-up Appointments ppointment in 2 weeks. - [name] ****[name]*** 60 minutes Return A Pick up Dakin's solution from your pharmacy. Off-Loading Wound #1 Sacrum Low air-loss mattress (Group 2) - continue to use. Turn and reposition every 2 hours Other: - continue to use specialty mattress. minimize in wheelchair to help aid in healing pressure wound. no pressure to sacral area to prevent worsening. Home Health Wound #1 Sacrum New wound care orders this week;  continue Home Health for wound care. May utilize formulary equivalent dressing for wound treatment orders unless otherwise specified. - [facility] home health weekly and all other days family to change. Wound Treatment Wound #1 - Sacrum Cleanser: Wound Cleanser (Home Health) Other:twice a day/30 Days Discharge Instructions: Cleanse the wound with wound cleanser prior to applying a clean dressing using gauze sponges, not tissue or cotton balls. Peri-Wound Care: Skin  Prep [facility]) Other:twice a day/30 Days Discharge Instructions: Use skin prep as directed Prim Dressing: Dakin's solution wet to dry [facility]) Other:twice a day/30 Days ary Discharge Instructions: Dakin's moisten gauze lightly pack into wound bed and undermining. Secondary Dressing: Woven Gauze Sponge, Non-Sterile 4x4 in [facility]) Other:twice a day/30 Days Discharge Instructions: Apply over primary dressing as directed. Secondary Dressing: ABD Pad, 5x9 (Home Health) Other:twice a day/30 Days Discharge Instructions: Apply over primary dressing as directed. Secured With: 9M Medipore H Soft Cloth Surgical T ape, 4 x 10 (in/yd) (Home Health) Other:twice a day/30 Days Discharge Instructions: Secure with tape as directed. Patient Medications llergies: No Known Allergies A Notifications Medication Indication Start End 01/18/2021 Dakin's Solution DOSE 1 - miscellaneous 0.125 % solution - moisten for wet to dry dressings 2x daily Electronic Signature(s) Signed: 01/18/2021 9:19:57 AM By: Mark Shan DO Entered By: [name] on 01/18/2021 09:19:57 -------------------------------------------------------------------------------- Problem List Details Patient Name: Date of Service: [name], [name] IN D. 01/18/2021 7:30 A M Medical Record Number: [id number] Patient Account Number: [id number] Date of Birth/Sex: Treating RN: [name] (56 y.o. [name] Primary Care Provider: Karle [name] Other  Clinician: Referring Provider: Treating Provider/Extender: [name] in Treatment: 0 Active Problems ICD-10 Encounter Code Description Active Date MDM Diagnosis L89.153 Pressure ulcer of sacral region, stage 3 01/18/2021 No Yes I69.359 Hemiplegia and hemiparesis following cerebral infarction affecting unspecified 01/18/2021 No Yes side E11.65 Type 2 diabetes mellitus with hyperglycemia 01/18/2021 No Yes Inactive Problems Resolved Problems Electronic Signature(s) Signed: 01/18/2021 9:23:11 AM By: Mark Shan DO Entered By: [name] on 01/18/2021 09:13:07 -------------------------------------------------------------------------------- Progress Note Details Patient Name: Date of Service: [name] IN D. 01/18/2021 7:30 A M Medical Record Number: [id number] Patient Account Number: [id number] Date of Birth/Sex: Treating RN: [name] (56 y.o. [name] Primary Care Provider: Karle [name] Other Clinician: Referring Provider: Treating Provider/Extender: [name] in Treatment: 0 Subjective Chief Complaint Information obtained from Patient Sacral ulcer History of Present Illness (HPI) Admission 01/18/2021 [name] is a 56 year old male with a past medical history of CVA with left-sided hemiparesis, language and speech deficits, hypertension, and seizure disorder that presents the clinic for a 51-month history of sacral ulcer. He was hospitalized on 11/23/2020 for new onset A. fib and GI bleed. He states that during this hospitalization he developed a pressure ulcer to his sacrum. He reports using Santyl to the wound bed twice daily. He has a group 2 air mattress and states he has been offloading the area by staying on his side. He currently denies signs of infection. He reports improvement in wound healing over the past month. Patient History Information obtained from Patient, Chart. Allergies No  Known Allergies Family History Unknown History. Social History Never smoker, Marital Status - Single, Alcohol Use - Never, Drug Use - Prior History, Caffeine Use - Rarely. Medical History Eyes Denies history of Cataracts, Glaucoma, Optic Neuritis Ear/Nose/Mouth/Throat Denies history of Chronic sinus problems/congestion, Middle ear problems Respiratory Denies history of Aspiration, Asthma, Chronic Obstructive Pulmonary Disease (COPD), Pneumothorax, Sleep Apnea, Tuberculosis Cardiovascular Patient has history of Hypertension Gastrointestinal Denies history of Cirrhosis , Colitis, Crohnoos, Hepatitis A, Hepatitis B, Hepatitis C Endocrine Patient has history of Type II Diabetes Denies history of Type I Diabetes Musculoskeletal Denies history of Gout, Rheumatoid Arthritis, Osteoarthritis, Osteomyelitis Neurologic Patient has history of Paraplegia, Seizure Disorder Denies history of Dementia, Neuropathy, Quadriplegia Medical A Surgical History Notes nd Genitourinary CKD stage 2 Review of  Systems (ROS) Constitutional Symptoms (General Health) Denies complaints or symptoms of Fatigue, Fever, Chills, Marked Weight Change. Eyes Denies complaints or symptoms of Dry Eyes, Vision Changes, Glasses / Contacts. Ear/Nose/Mouth/Throat Denies complaints or symptoms of Chronic sinus problems or rhinitis. Respiratory Denies complaints or symptoms of Chronic or frequent coughs, Shortness of Breath. Cardiovascular Denies complaints or symptoms of Chest pain. Gastrointestinal Denies complaints or symptoms of Frequent diarrhea, Nausea, Vomiting. Endocrine Denies complaints or symptoms of Heat/cold intolerance. Genitourinary Denies complaints or symptoms of Frequent urination. Integumentary (Skin) Complains or has symptoms of Wounds. Musculoskeletal Denies complaints or symptoms of Muscle Pain, Muscle Weakness. Neurologic Denies complaints or symptoms of  Numbness/parasthesias. Psychiatric Denies complaints or symptoms of Claustrophobia, Suicidal. Objective Constitutional respirations regular, non-labored and within target range for patient.. Vitals Time Taken: 7:49 AM, Height: 64 in, Source: Stated, Temperature: 98.8 F, Pulse: 74 bpm, Respiratory Rate: 17 breaths/min, Blood Pressure: 95/70 mmHg. Psychiatric pleasant and cooperative. General Notes: Sacral ulcer with granulation tissue, hyper granulated areas and tunneling at the 12 o'clock position. No drainage noted. No acute signs of infection. Integumentary (Hair, Skin) Wound #1 status is Open. Original cause of wound was Pressure Injury. The date acquired was: 11/13/2020. The wound is located on the Sacrum. The wound measures 3.5cm length x 2.5cm width x 1.6cm depth; 6.872cm^2 area and 10.996cm^3 volume. There is Fat Layer (Subcutaneous Tissue) exposed. There is no tunneling noted, however, there is undermining starting at 12:00 and ending at 2:00 with a maximum distance of 2.5cm. There is a medium amount of serosanguineous drainage noted. The wound margin is distinct with the outline attached to the wound base. There is large (67-100%) red, pink granulation within the wound bed. There is a small (1-33%) amount of necrotic tissue within the wound bed including Adherent Slough. Assessment Active Problems ICD-10 Pressure ulcer of sacral region, stage 3 Hemiplegia and hemiparesis following cerebral infarction affecting unspecified side Type 2 diabetes mellitus with hyperglycemia Patient presents with a stage III pressure ulcer to his sacrum that developed during his hospitalization in November. He spent 30 days hospitalized and this also included his time during rehab. He states that the wound is improving. At this time I recommended using Dakin's wet-to-dry dressings twice daily. I also recommended aggressive offloading. He has a group 2 air mattress. No signs of surrounding infection on  exam. If surface is improved would like to switch to [facility] and next clinic visit. Follow-up in 2 weeks. 46 minutes was spent on the encounter including face-to-face, EMR review and coordination of care. Plan Follow-up Appointments: Return Appointment in 2 weeks. - Dr. Heber [place] ****[name]*** 60 minutes Pick up Dakin's solution from your pharmacy. Off-Loading: Wound #1 Sacrum: Low air-loss mattress (Group 2) - continue to use. Turn and reposition every 2 hours Other: - continue to use specialty mattress. minimize in wheelchair to help aid in healing pressure wound. no pressure to sacral area to prevent worsening. Home Health: Wound #1 Sacrum: New wound care orders this week; continue Home Health for wound care. May utilize formulary equivalent dressing for wound treatment orders unless otherwise specified. - [facility] home health weekly and all other days family to change. The following medication(s) was prescribed: Dakin's Solution miscellaneous 0.125 % solution 1 moisten for wet to dry dressings 2x daily starting 01/18/2021 WOUND #1: - Sacrum Wound Laterality: Cleanser: Wound Cleanser ([place]) Other:twice a day/30 Days Discharge Instructions: Cleanse the wound with wound cleanser prior to applying a clean dressing using gauze sponges, not tissue or cotton  balls. Peri-Wound Care: Skin Prep [facility]) Other:twice a day/30 Days Discharge Instructions: Use skin prep as directed Prim Dressing: Dakin's solution wet to dry [facility]) Other:twice a day/30 Days ary Discharge Instructions: Dakin's moisten gauze lightly pack into wound bed and undermining. Secondary Dressing: Woven Gauze Sponge, Non-Sterile 4x4 in [facility]) Other:twice a day/30 Days Discharge Instructions: Apply over primary dressing as directed. Secondary Dressing: ABD Pad, 5x9 (Home Health) Other:twice a day/30 Days Discharge Instructions: Apply over primary dressing as directed. Secured With: 57M Medipore H Soft  Cloth Surgical T ape, 4 x 10 (in/yd) (Home Health) Other:twice a day/30 Days Discharge Instructions: Secure with tape as directed. 1. Dakin's wet-to-dry dressings 2. Aggressive offloading 3. Follow-up in 2 weeks Electronic Signature(s) Signed: 01/18/2021 9:23:11 AM By: Mark Shan DO Entered By: [name] on 01/18/2021 09:22:10 -------------------------------------------------------------------------------- HxROS Details Patient Name: Date of Service: [name], [name] IN D. 01/18/2021 7:30 A M Medical Record Number: [id number] Patient Account Number: [id number] Date of Birth/Sex: Treating RN: [name] (56 y.o. Erie Noe Primary Care Provider: Karle [name] Other Clinician: Referring Provider: Treating Provider/Extender: [name] in Treatment: 0 Information Obtained From Patient Chart Constitutional Symptoms (General Health) Complaints and Symptoms: Negative for: Fatigue; Fever; Chills; Marked Weight Change Eyes Complaints and Symptoms: Negative for: Dry Eyes; Vision Changes; Glasses / Contacts Medical History: Negative for: Cataracts; Glaucoma; Optic Neuritis Ear/Nose/Mouth/Throat Complaints and Symptoms: Negative for: Chronic sinus problems or rhinitis Medical History: Negative for: Chronic sinus problems/congestion; Middle ear problems Respiratory Complaints and Symptoms: Negative for: Chronic or frequent coughs; Shortness of Breath Medical History: Negative for: Aspiration; Asthma; Chronic Obstructive Pulmonary Disease (COPD); Pneumothorax; Sleep Apnea; Tuberculosis Cardiovascular Complaints and Symptoms: Negative for: Chest pain Medical History: Positive for: Hypertension Gastrointestinal Complaints and Symptoms: Negative for: Frequent diarrhea; Nausea; Vomiting Medical History: Negative for: Cirrhosis ; Colitis; Crohns; Hepatitis A; Hepatitis B; Hepatitis C Endocrine Complaints and Symptoms: Negative for:  Heat/cold intolerance Medical History: Positive for: Type II Diabetes Negative for: Type I Diabetes Genitourinary Complaints and Symptoms: Negative for: Frequent urination Medical History: Past Medical History Notes: CKD stage 2 Integumentary (Skin) Complaints and Symptoms: Positive for: Wounds Musculoskeletal Complaints and Symptoms: Negative for: Muscle Pain; Muscle Weakness Medical History: Negative for: Gout; Rheumatoid Arthritis; Osteoarthritis; Osteomyelitis Neurologic Complaints and Symptoms: Negative for: Numbness/parasthesias Medical History: Positive for: Paraplegia; Seizure Disorder Negative for: Dementia; Neuropathy; Quadriplegia Psychiatric Complaints and Symptoms: Negative for: Claustrophobia; Suicidal Hematologic/Lymphatic Immunological Oncologic Immunizations Pneumococcal Vaccine: Received Pneumococcal Vaccination: Yes Received Pneumococcal Vaccination On or After 60th Birthday: Yes Implantable Devices None Family and Social History Unknown History: Yes; Never smoker; Marital Status - Single; Alcohol Use: Never; Drug Use: Prior History; Caffeine Use: Rarely; Financial Concerns: No; Food, Clothing or Shelter Needs: No; Support System Lacking: No; Transportation Concerns: No Electronic Signature(s) Signed: 01/18/2021 9:23:11 AM By: Mark Shan DO Signed: 01/18/2021 11:40:24 AM By: Rhae Hammock RN Entered By: Rhae Hammock on 01/18/2021 07:53:35 -------------------------------------------------------------------------------- SuperBill Details Patient Name: Date of Service: [name], [place] D. 01/18/2021 Medical Record Number: [id number] Patient Account Number: [id number] Date of Birth/Sex: Treating RN: [name] (56 y.o. [name] Primary Care Provider: Karle [name] Other Clinician: Referring Provider: Treating Provider/Extender: [name] in Treatment: 0 Diagnosis Coding ICD-10 Codes Code  Description [phone] Pressure ulcer of sacral region, stage 3 I69.359 Hemiplegia and hemiparesis following cerebral infarction affecting unspecified side E11.65 Type 2 diabetes mellitus with hyperglycemia Facility Procedures CPT4 Code: [id number] Description: 99214 - WOUND CARE VISIT-LEV 4 EST PT  Modifier: Quantity: 1 Physician Procedures : CPT4 Code Description Modifier [id number] - WC PHYS LEVEL 4 - NEW PT ICD-10 Diagnosis Description L89.153 Pressure ulcer of sacral region, stage 3 I69.359 Hemiplegia and hemiparesis following cerebral infarction affecting unspecified side E11.65  Type 2 diabetes mellitus with hyperglycemia Quantity: 1 Electronic Signature(s) Signed: 01/18/2021 9:23:11 AM By: Mark Shan DO Entered By: [name] on 01/18/2021 09:22:41

## 2021-01-18 NOTE — Progress Notes (Signed)
UDELL, MAZZOCCO ([id number]) Visit Report for 01/18/2021 Allergy List Details Patient Name: Date of Service: [name] IN D. 01/18/2021 7:30 A M Medical Record Number: [id number] Patient Account Number: [id number] Date of Birth/Sex: Treating RN: 1965/10/14 (56 y.o. Erie Noe Primary Care Latajah Thuman: Karle Plumber Other Clinician: Referring Shaylin Blatt: Treating Neomia Herbel/Extender: Verdell Face Weeks in Treatment: 0 Allergies Active Allergies No Known Allergies Allergy Notes Electronic Signature(s) Signed: 01/18/2021 11:40:24 AM By: Rhae Hammock RN Entered By: Rhae Hammock on 01/18/2021 07:51:30 -------------------------------------------------------------------------------- Arrival Information Details Patient Name: Date of Service: [name], [name] IN D. 01/18/2021 7:30 A M Medical Record Number: [id number] Patient Account Number: [id number] Date of Birth/Sex: Treating RN: 05-02-1965 (56 y.o. [name] Primary Care Braxxton Stoudt: Karle Plumber Other Clinician: Referring Amori Colomb: Treating Ingra Rother/Extender: Missy Sabins in Treatment: 0 Visit Information Patient Arrived: Wheel Chair Arrival Time: 07:49 Accompanied By: wife Transfer Assistance: Civil Service fast streamer Patient Identification Verified: Yes Secondary Verification Process Completed: Yes Patient Requires Transmission-Based Precautions: No Patient Has Alerts: No Electronic Signature(s) Signed: 01/18/2021 11:40:57 AM By: Deon Pilling RN, BSN Entered By: Deon Pilling on 01/18/2021 08:31:16 -------------------------------------------------------------------------------- Clinic Level of Care Assessment Details Patient Name: Date of Service: [name] IN D. 01/18/2021 7:30 A M Medical Record Number: [id number] Patient Account Number: [id number] Date of Birth/Sex: Treating RN: 1965/10/03 (56 y.o. [name] Primary Care Rangel Echeverri: Karle Plumber Other  Clinician: Referring Niralya Ohanian: Treating Kennidi Yoshida/Extender: Missy Sabins in Treatment: 0 Clinic Level of Care Assessment Items TOOL 2 Quantity Score X- 1 0 Use when only an EandM is performed on the INITIAL visit ASSESSMENTS - Nursing Assessment / Reassessment X- 1 20 General Physical Exam (combine w/ comprehensive assessment (listed just below) when performed on new pt. evals) X- 1 25 Comprehensive Assessment (HX, ROS, Risk Assessments, Wounds Hx, etc.) ASSESSMENTS - Wound and Skin A ssessment / Reassessment X - Simple Wound Assessment / Reassessment - one wound 1 5 []  - 0 Complex Wound Assessment / Reassessment - multiple wounds X- 1 10 Dermatologic / Skin Assessment (not related to wound area) ASSESSMENTS - Ostomy and/or Continence Assessment and Care []  - 0 Incontinence Assessment and Management []  - 0 Ostomy Care Assessment and Management (repouching, etc.) PROCESS - Coordination of Care X - Simple Patient / Family Education for ongoing care 1 15 []  - 0 Complex (extensive) Patient / Family Education for ongoing care X- 1 10 Staff obtains Programmer, systems, Records, T Results / Process Orders est X- 1 10 Staff telephones HHA, Nursing Homes / Clarify orders / etc []  - 0 Routine Transfer to another Facility (non-emergent condition) []  - 0 Routine Hospital Admission (non-emergent condition) []  - 0 New Admissions / Biomedical engineer / Ordering NPWT Apligraf, etc. , []  - 0 Emergency Hospital Admission (emergent condition) X- 1 10 Simple Discharge Coordination []  - 0 Complex (extensive) Discharge Coordination PROCESS - Special Needs []  - 0 Pediatric / Minor Patient Management []  - 0 Isolation Patient Management []  - 0 Hearing / Language / Visual special needs []  - 0 Assessment of Community assistance (transportation, D/C planning, etc.) []  - 0 Additional assistance / Altered mentation []  - 0 Support Surface(s) Assessment (bed, cushion,  seat, etc.) INTERVENTIONS - Wound Cleansing / Measurement X- 1 5 Wound Imaging (photographs - any number of wounds) []  - 0 Wound Tracing (instead of photographs) X- 1 5 Simple Wound Measurement - one wound []  - 0 Complex Wound Measurement - multiple wounds X- 1 5 Simple Wound  Cleansing - one wound []  - 0 Complex Wound Cleansing - multiple wounds INTERVENTIONS - Wound Dressings X - Small Wound Dressing one or multiple wounds 1 10 []  - 0 Medium Wound Dressing one or multiple wounds []  - 0 Large Wound Dressing one or multiple wounds []  - 0 Application of Medications - injection INTERVENTIONS - Miscellaneous []  - 0 External ear exam []  - 0 Specimen Collection (cultures, biopsies, blood, body fluids, etc.) []  - 0 Specimen(s) / Culture(s) sent or taken to Lab for analysis []  - 0 Patient Transfer (multiple staff / Harrel Lemon Lift / Similar devices) []  - 0 Simple Staple / Suture removal (25 or less) []  - 0 Complex Staple / Suture removal (26 or more) []  - 0 Hypo / Hyperglycemic Management (close monitor of Blood Glucose) []  - 0 Ankle / Brachial Index (ABI) - do not check if billed separately Has the patient been seen at the hospital within the last three years: Yes Total Score: 130 Level Of Care: New/Established - Level 4 Electronic Signature(s) Signed: 01/18/2021 11:40:57 AM By: Deon Pilling RN, BSN Entered By: Deon Pilling on 01/18/2021 08:40:08 -------------------------------------------------------------------------------- Encounter Discharge Information Details Patient Name: Date of Service: [name], [name] IN D. 01/18/2021 7:30 A M Medical Record Number: [id number] Patient Account Number: [id number] Date of Birth/Sex: Treating RN: Jun 21, 1965 (56 y.o. [name] Primary Care Huntley Demedeiros: Karle Plumber Other Clinician: Referring Colten Desroches: Treating Severin Bou/Extender: Missy Sabins in Treatment: 0 Encounter Discharge Information Items Discharge  Condition: Stable Ambulatory Status: Wheelchair Discharge Destination: Home Transportation: Private Auto Accompanied By: wife Schedule Follow-up Appointment: Yes Clinical Summary of Care: Electronic Signature(s) Signed: 01/18/2021 11:40:57 AM By: Deon Pilling RN, BSN Entered By: Deon Pilling on 01/18/2021 09:02:33 -------------------------------------------------------------------------------- Lower Extremity Assessment Details Patient Name: Date of Service: [name] IN D. 01/18/2021 7:30 A M Medical Record Number: [id number] Patient Account Number: [id number] Date of Birth/Sex: Treating RN: November 27, 1965 (56 y.o. Erie Noe Primary Care Filemon Breton: Karle Plumber Other Clinician: Referring Meade Hogeland: Treating Aubrielle Stroud/Extender: Missy Sabins in Treatment: 0 Electronic Signature(s) Signed: 01/18/2021 11:40:24 AM By: Rhae Hammock RN Signed: 01/18/2021 11:40:24 AM By: Rhae Hammock RN Entered By: Rhae Hammock on 01/18/2021 07:59:30 -------------------------------------------------------------------------------- Multi Wound Chart Details Patient Name: Date of Service: [name], [name] IN D. 01/18/2021 7:30 A M Medical Record Number: [id number] Patient Account Number: [id number] Date of Birth/Sex: Treating RN: Jul 26, 1965 (56 y.o. Ernestene Mention Primary Care Hanya Guerin: Karle Plumber Other Clinician: Referring Dakwan Pridgen: Treating Savior Himebaugh/Extender: Missy Sabins in Treatment: 0 Vital Signs Height(in): 19 Pulse(bpm): 43 Weight(lbs): Blood Pressure(mmHg): 34/70 Body Mass Index(BMI): Temperature(F): 98.8 Respiratory Rate(breaths/min): 17 Photos: [N/A:N/A] Sacrum N/A N/A Wound Location: Pressure Injury N/A N/A Wounding Event: Pressure Ulcer N/A N/A Primary Etiology: Hypertension, Type II Diabetes, N/A N/A Comorbid History: Paraplegia, Seizure Disorder 11/13/2020 N/A N/A Date Acquired: 0 N/A N/A Weeks  of Treatment: Open N/A N/A Wound Status: 3.5x2.5x1.6 N/A N/A Measurements L x W x D (cm) 6.872 N/A N/A A (cm) : rea 10.996 N/A N/A Volume (cm) : 0.00% N/A N/A % Reduction in A rea: 0.00% N/A N/A % Reduction in Volume: 12 Starting Position 1 (o'clock): 2 Ending Position 1 (o'clock): 2.5 Maximum Distance 1 (cm): Yes N/A N/A Undermining: Category/Stage III N/A N/A Classification: Medium N/A N/A Exudate A mount: Serosanguineous N/A N/A Exudate Type: red, brown N/A N/A Exudate Color: Distinct, outline attached N/A N/A Wound Margin: Large (67-100%) N/A N/A Granulation A mount: Red, Pink N/A N/A Granulation Quality: Small (  1-33%) N/A N/A Necrotic A mount: Fat Layer (Subcutaneous Tissue): Yes N/A N/A Exposed Structures: Fascia: No Tendon: No Muscle: No Joint: No Bone: No Small (1-33%) N/A N/A Epithelialization: Treatment Notes Wound #1 (Sacrum) Cleanser Wound Cleanser Discharge Instruction: Cleanse the wound with wound cleanser prior to applying a clean dressing using gauze sponges, not tissue or cotton balls. Peri-Wound Care Skin Prep Discharge Instruction: Use skin prep as directed Topical Primary Dressing Dakin's solution wet to dry Discharge Instruction: Dakin's moisten gauze lightly pack into wound bed and undermining. Secondary Dressing Woven Gauze Sponge, Non-Sterile 4x4 in Discharge Instruction: Apply over primary dressing as directed. ABD Pad, 5x9 Discharge Instruction: Apply over primary dressing as directed. Secured With 88M Medipore H Soft Cloth Surgical T ape, 4 x 10 (in/yd) Discharge Instruction: Secure with tape as directed. Compression Wrap Compression Stockings Add-Ons Electronic Signature(s) Signed: 01/18/2021 9:23:11 AM By: Kalman Shan DO Signed: 01/18/2021 11:36:31 AM By: Baruch Gouty RN, BSN Entered By: Kalman Shan on 01/18/2021  09:13:31 -------------------------------------------------------------------------------- Multi-Disciplinary Care Plan Details Patient Name: Date of Service: [name], [name] IN D. 01/18/2021 7:30 A M Medical Record Number: [id number] Patient Account Number: [id number] Date of Birth/Sex: Treating RN: May 31, 1965 (56 y.o. [name] Primary Care Lilit Cinelli: Karle Plumber Other Clinician: Referring Kingsly Kloepfer: Treating Jaylean Buenaventura/Extender: Missy Sabins in Treatment: 0 Active Inactive Abuse / Safety / Falls / Self Care Management Nursing Diagnoses: Potential for falls Potential for injury related to falls Goals: Patient will remain injury free related to falls Date Initiated: 01/18/2021 Target Resolution Date: 03/22/2021 Goal Status: Active Patient/caregiver will verbalize/demonstrate measure taken to improve self care Date Initiated: 01/18/2021 Target Resolution Date: 02/15/2021 Goal Status: Active Interventions: Assess: immobility, friction, shearing, incontinence upon admission and as needed Assess self care needs on admission and as needed Notes: Orientation to the Wound Care Program Nursing Diagnoses: Knowledge deficit related to the wound healing center program Goals: Patient/caregiver will verbalize understanding of the [facility] Date Initiated: 01/18/2021 Target Resolution Date: 02/01/2021 Goal Status: Active Interventions: Provide education on orientation to the wound center Notes: Pain, Acute or Chronic Nursing Diagnoses: Pain, acute or chronic: actual or potential Potential alteration in comfort, pain Goals: Patient will verbalize adequate pain control and receive pain control interventions during procedures as needed Date Initiated: 01/18/2021 Target Resolution Date: 02/15/2021 Goal Status: Active Patient/caregiver will verbalize comfort level met Date Initiated: 01/18/2021 Target Resolution Date: 02/15/2021 Goal Status:  Active Interventions: Provide education on pain management Reposition patient for comfort Treatment Activities: Administer pain control measures as ordered : 01/18/2021 Notes: Pressure Nursing Diagnoses: Knowledge deficit related to causes and risk factors for pressure ulcer development Potential for impaired tissue integrity related to pressure, friction, moisture, and shear Goals: Patient will remain free from development of additional pressure ulcers Date Initiated: 01/18/2021 Target Resolution Date: 03/22/2021 Goal Status: Active Patient/caregiver will verbalize risk factors for pressure ulcer development Date Initiated: 01/18/2021 Target Resolution Date: 03/22/2021 Goal Status: Active Interventions: Assess: immobility, friction, shearing, incontinence upon admission and as needed Assess offloading mechanisms upon admission and as needed Assess potential for pressure ulcer upon admission and as needed Provide education on pressure ulcers Treatment Activities: Patient referred for home evaluation of offloading devices/mattresses : 01/18/2021 Patient referred for pressure reduction/relief devices : 01/18/2021 Pressure reduction/relief device ordered : 01/18/2021 Notes: Wound/Skin Impairment Nursing Diagnoses: Knowledge deficit related to ulceration/compromised skin integrity Goals: Patient/caregiver will verbalize understanding of skin care regimen Date Initiated: 01/18/2021 Target Resolution Date: 02/15/2021 Goal Status: Active Interventions: Assess patient/caregiver  ability to perform ulcer/skin care regimen upon admission and as needed Assess ulceration(s) every visit Provide education on ulcer and skin care Treatment Activities: Skin care regimen initiated : 01/18/2021 Topical wound management initiated : 01/18/2021 Notes: Electronic Signature(s) Signed: 01/18/2021 11:40:57 AM By: Deon Pilling RN, BSN Entered By: Deon Pilling on 01/18/2021  08:13:04 -------------------------------------------------------------------------------- Pain Assessment Details Patient Name: Date of Service: [name] IN D. 01/18/2021 7:30 A M Medical Record Number: [id number] Patient Account Number: [id number] Date of Birth/Sex: Treating RN: 02-10-1965 (56 y.o. Burnadette Pop, Lauren Primary Care Krisi Azua: Karle Plumber Other Clinician: Referring Anaika Santillano: Treating Jalisa Sacco/Extender: Missy Sabins in Treatment: 0 Active Problems Location of Pain Severity and Description of Pain Patient Has Paino No Site Locations Pain Management and Medication Current Pain Management: Electronic Signature(s) Signed: 01/18/2021 11:40:24 AM By: Rhae Hammock RN Entered By: Rhae Hammock on 01/18/2021 08:04:17 -------------------------------------------------------------------------------- Patient/Caregiver Education Details Patient Name: Date of Service: [name] IN D. 1/6/2023andnbsp7:30 [place] Record Number: [id number] Patient Account Number: [id number] Date of Birth/Gender: Treating RN: 1965/11/16 (56 y.o. [name] Primary Care Physician: Karle Plumber Other Clinician: Referring Physician: Treating Physician/Extender: Missy Sabins in Treatment: 0 Education Assessment Education Provided To: Patient and Caregiver wife Education Topics Provided Pressure: Handouts: Pressure Ulcers: Care and Offloading, Pressure Ulcers: Care and Offloading 2, Preventing Pressure Ulcers Methods: Explain/Verbal, Printed Responses: Reinforcements needed [place]: o Handouts: Welcome T [facility] o Methods: Explain/Verbal, Printed Responses: Reinforcements needed Electronic Signature(s) Signed: 01/18/2021 11:40:57 AM By: Deon Pilling RN, BSN Entered By: Deon Pilling on 01/18/2021  08:13:36 -------------------------------------------------------------------------------- Wound Assessment Details Patient Name: Date of Service: [name] IN D. 01/18/2021 7:30 A M Medical Record Number: [id number] Patient Account Number: [id number] Date of Birth/Sex: Treating RN: Mar 10, 1965 (56 y.o. Burnadette Pop, Lauren Primary Care Nelsy Madonna: Karle Plumber Other Clinician: Referring Chevie Birkhead: Treating Bryan Goin/Extender: Missy Sabins in Treatment: 0 Wound Status Wound Number: 1 Primary Etiology: Pressure Ulcer Wound Location: Sacrum Wound Status: Open Wounding Event: Pressure Injury Comorbid Hypertension, Type II Diabetes, Paraplegia, Seizure History: Disorder Date Acquired: 11/13/2020 Weeks Of Treatment: 0 Clustered Wound: No Photos Wound Measurements Length: (cm) 3.5 Width: (cm) 2.5 Depth: (cm) 1.6 Area: (cm) 6.872 Volume: (cm) 10.996 % Reduction in Area: 0% % Reduction in Volume: 0% Epithelialization: Small (1-33%) Tunneling: No Undermining: Yes Starting Position (o'clock): 12 Ending Position (o'clock): 2 Maximum Distance: (cm) 2.5 Wound Description Classification: Category/Stage III Wound Margin: Distinct, outline attached Exudate Amount: Medium Exudate Type: Serosanguineous Exudate Color: red, brown Foul Odor After Cleansing: No Slough/Fibrino Yes Wound Bed Granulation Amount: Large (67-100%) Exposed Structure Granulation Quality: Red, Pink Fascia Exposed: No Necrotic Amount: Small (1-33%) Fat Layer (Subcutaneous Tissue) Exposed: Yes Necrotic Quality: Adherent Slough Tendon Exposed: No Muscle Exposed: No Joint Exposed: No Bone Exposed: No Treatment Notes Wound #1 (Sacrum) Cleanser Wound Cleanser Discharge Instruction: Cleanse the wound with wound cleanser prior to applying a clean dressing using gauze sponges, not tissue or cotton balls. Peri-Wound Care Skin Prep Discharge Instruction: Use skin prep as  directed Topical Primary Dressing Dakin's solution wet to dry Discharge Instruction: Dakin's moisten gauze lightly pack into wound bed and undermining. Secondary Dressing Woven Gauze Sponge, Non-Sterile 4x4 in Discharge Instruction: Apply over primary dressing as directed. ABD Pad, 5x9 Discharge Instruction: Apply over primary dressing as directed. Secured With 73M Medipore H Soft Cloth Surgical T ape, 4 x 10 (in/yd) Discharge Instruction: Secure with tape as directed.  Compression Wrap Compression Stockings Add-Ons Electronic Signature(s) Signed: 01/18/2021 11:40:24 AM By: Rhae Hammock RN Entered By: Rhae Hammock on 01/18/2021 08:02:20 -------------------------------------------------------------------------------- Vitals Details Patient Name: Date of Service: [name], [name] IN D. 01/18/2021 7:30 A M Medical Record Number: [id number] Patient Account Number: [id number] Date of Birth/Sex: Treating RN: 09-26-1965 (56 y.o. Erie Noe Primary Care Muhanad Torosyan: Karle Plumber Other Clinician: Referring Andrian Urbach: Treating Jostin Rue/Extender: Missy Sabins in Treatment: 0 Vital Signs Time Taken: 07:49 Temperature (F): 98.8 Height (in): 64 Pulse (bpm): 74 Source: Stated Respiratory Rate (breaths/min): 17 Blood Pressure (mmHg): 95/70 Reference Range: 80 - 120 mg / dl Electronic Signature(s) Signed: 01/18/2021 11:40:24 AM By: Rhae Hammock RN Entered By: Rhae Hammock on 01/18/2021 07:49:55

## 2021-01-18 NOTE — Progress Notes (Signed)
ARDIE, MCLENNAN ([id number]) Visit Report for 01/18/2021 Abuse/Suicide Risk Screen Details Patient Name: Date of Service: [name] IN D. 01/18/2021 7:30 A M Medical Record Number: [id number] Patient Account Number: [id number] Date of Birth/Sex: Treating RN: [birth year] (56 y.o. [name], Lauren Primary Care Regan Llorente: Karle Plumber Other Clinician: Referring Thaxton Pelley: Treating Tamra Koos/Extender: Missy Sabins in Treatment: 0 Abuse/Suicide Risk Screen Items Answer ABUSE RISK SCREEN: Has anyone close to you tried to hurt or harm you recentlyo No Do you feel uncomfortable with anyone in your familyo No Has anyone forced you do things that you didnt want to doo No Electronic Signature(s) Signed: 01/18/2021 11:40:24 AM By: Rhae Hammock RN Entered By: Rhae Hammock on 01/18/2021 07:57:22 -------------------------------------------------------------------------------- Activities of Daily Living Details Patient Name: Date of Service: [name] IN D. 01/18/2021 7:30 A M Medical Record Number: [id number] Patient Account Number: [id number] Date of Birth/Sex: Treating RN: 09/26/1965 (56 y.o. [name], Lauren Primary Care Keion Neels: Karle Plumber Other Clinician: Referring Loveda Colaizzi: Treating Annslee Tercero/Extender: Missy Sabins in Treatment: 0 Activities of Daily Living Items Answer Activities of Daily Living (Please select one for each item) Drive Automobile Not Able T Medications ake Need Assistance Use T elephone Need Assistance Care for Appearance Need Assistance Use T oilet Need Assistance Bath / Shower Need Assistance Dress Self Need Assistance Feed Self Need Assistance Walk Need Assistance Get In / Out Bed Need Assistance Housework Need Assistance Prepare Meals Need Assistance Handle Money Need Assistance Shop for Self Need Assistance Electronic Signature(s) Signed: 01/18/2021 11:40:24 AM By: Rhae Hammock RN Entered By: Rhae Hammock on 01/18/2021 07:58:06 -------------------------------------------------------------------------------- Education Screening Details Patient Name: Date of Service: [name] IN D. 01/18/2021 7:30 A M Medical Record Number: [id number] Patient Account Number: [id number] Date of Birth/Sex: Treating RN: 1965-09-17 (56 y.o. [name], Lauren Primary Care Taiwana Willison: Karle Plumber Other Clinician: Referring Brick Ketcher: Treating Vanessia Bokhari/Extender: Missy Sabins in Treatment: 0 Primary Learner Assessed: Patient Learning Preferences/Education Level/Primary Language Learning Preference: Explanation, Demonstration, Communication Board, Printed Material Highest Education Level: High School Preferred Language: English Cognitive Barrier Language Barrier: No Translator Needed: No Memory Deficit: No Emotional Barrier: No Cultural/Religious Beliefs Affecting Medical Care: No Physical Barrier Impaired Vision: No Impaired Hearing: No Decreased Hand dexterity: No Knowledge/Comprehension Knowledge Level: High Comprehension Level: High Ability to understand written instructions: High Ability to understand verbal instructions: High Motivation Anxiety Level: Calm Cooperation: Cooperative Education Importance: Denies Need Interest in Health Problems: Asks Questions Perception: Coherent Willingness to Engage in Self-Management High Activities: Readiness to Engage in Self-Management High Activities: Electronic Signature(s) Signed: 01/18/2021 11:40:24 AM By: Rhae Hammock RN Entered By: Rhae Hammock on 01/18/2021 07:58:33 -------------------------------------------------------------------------------- Fall Risk Assessment Details Patient Name: Date of Service: [name], Mark D. 01/18/2021 7:30 A M Medical Record Number: [id number] Patient Account Number: [id number] Date of Birth/Sex: Treating RN: 08/15/1965 (56 y.o.  [name], Lauren Primary Care Gauri Galvao: Karle Plumber Other Clinician: Referring Taleia Sadowski: Treating Ahmiya Abee/Extender: Missy Sabins in Treatment: 0 Fall Risk Assessment Items Have you had 2 or more falls in the last 12 monthso 0 Yes Have you had any fall that resulted in injury in the last 12 monthso 0 Yes FALLS RISK SCREEN History of falling - immediate or within 3 months 25 Yes Secondary diagnosis (Do you have 2 or more medical diagnoseso) 0 No Ambulatory aid None/bed rest/wheelchair/nurse 0 No Crutches/cane/walker 0 No Furniture 0 No Intravenous therapy Access/Saline/Heparin Lock 0 No Gait/Transferring Normal/ bed rest/  wheelchair 0 No Weak (short steps with or without shuffle, stooped but able to lift head while walking, may seek 0 No support from furniture) Impaired (short steps with shuffle, may have difficulty arising from chair, head down, impaired 0 No balance) Mental Status Oriented to own ability 0 No Electronic Signature(s) Signed: 01/18/2021 11:40:24 AM By: Rhae Hammock RN Entered By: Rhae Hammock on 01/18/2021 07:59:06 -------------------------------------------------------------------------------- Foot Assessment Details Patient Name: Date of Service: [name] IN D. 01/18/2021 7:30 A M Medical Record Number: [id number] Patient Account Number: [id number] Date of Birth/Sex: Treating RN: Mar 16, 1965 (56 y.o. [name], Lauren Primary Care Kynisha Memon: Karle Plumber Other Clinician: Referring Madhuri Vacca: Treating Anelis Hrivnak/Extender: Missy Sabins in Treatment: 0 Foot Assessment Items Site Locations + = Sensation present, - = Sensation absent, C = Callus, U = Ulcer R = Redness, W = Warmth, M = Maceration, PU = Pre-ulcerative lesion F = Fissure, S = Swelling, D = Dryness Assessment Right: Left: Other Deformity: No No Prior Foot Ulcer: No No Prior Amputation: No No Charcot Joint: No  No Ambulatory Status: Gait: Notes N/A pt. paraplegic Electronic Signature(s) Signed: 01/18/2021 11:40:24 AM By: Rhae Hammock RN Entered By: Rhae Hammock on 01/18/2021 07:59:23 -------------------------------------------------------------------------------- Nutrition Risk Screening Details Patient Name: Date of Service: [name] IN D. 01/18/2021 7:30 A M Medical Record Number: [id number] Patient Account Number: [id number] Date of Birth/Sex: Treating RN: [birth year] (56 y.o. Erie Noe Primary Care Verdean Murin: Karle Plumber Other Clinician: Referring Aiva Miskell: Treating Keeara Frees/Extender: Missy Sabins in Treatment: 0 Height (in): 64 Weight (lbs): Body Mass Index (BMI): Nutrition Risk Screening Items Score Screening NUTRITION RISK SCREEN: I have an illness or condition that made me change the kind and/or amount of food I eat 0 No I eat fewer than two meals per day 0 No I eat few fruits and vegetables, or milk products 0 No I have three or more drinks of beer, liquor or wine almost every day 0 No I have tooth or mouth problems that make it hard for me to eat 0 No I don't always have enough money to buy the food I need 0 No I eat alone most of the time 0 No I take three or more different prescribed or over-the-counter drugs a day 0 No Without wanting to, I have lost or gained 10 pounds in the last six months 0 No I am not always physically able to shop, cook and/or feed myself 0 No Nutrition Protocols Good Risk Protocol 0 No interventions needed Moderate Risk Protocol High Risk Proctocol Risk Level: Good Risk Score: 0 Electronic Signature(s) Signed: 01/18/2021 11:40:24 AM By: Rhae Hammock RN Entered By: Rhae Hammock on 01/18/2021 07:59:13

## 2021-01-22 DIAGNOSIS — I69328 Other speech and language deficits following cerebral infarction: Secondary | ICD-10-CM | POA: Diagnosis not present

## 2021-01-22 DIAGNOSIS — Z85048 Personal history of other malignant neoplasm of rectum, rectosigmoid junction, and anus: Secondary | ICD-10-CM | POA: Diagnosis not present

## 2021-01-22 DIAGNOSIS — I10 Essential (primary) hypertension: Secondary | ICD-10-CM | POA: Diagnosis not present

## 2021-01-22 DIAGNOSIS — E669 Obesity, unspecified: Secondary | ICD-10-CM | POA: Diagnosis not present

## 2021-01-22 DIAGNOSIS — G40909 Epilepsy, unspecified, not intractable, without status epilepticus: Secondary | ICD-10-CM | POA: Diagnosis not present

## 2021-01-22 DIAGNOSIS — L89894 Pressure ulcer of other site, stage 4: Secondary | ICD-10-CM | POA: Diagnosis not present

## 2021-01-22 DIAGNOSIS — E785 Hyperlipidemia, unspecified: Secondary | ICD-10-CM | POA: Diagnosis not present

## 2021-01-22 DIAGNOSIS — Z9049 Acquired absence of other specified parts of digestive tract: Secondary | ICD-10-CM | POA: Diagnosis not present

## 2021-01-22 DIAGNOSIS — M109 Gout, unspecified: Secondary | ICD-10-CM | POA: Diagnosis not present

## 2021-01-22 DIAGNOSIS — E119 Type 2 diabetes mellitus without complications: Secondary | ICD-10-CM | POA: Diagnosis not present

## 2021-01-22 DIAGNOSIS — I4891 Unspecified atrial fibrillation: Secondary | ICD-10-CM | POA: Diagnosis not present

## 2021-01-22 DIAGNOSIS — Z6834 Body mass index (BMI) 34.0-34.9, adult: Secondary | ICD-10-CM | POA: Diagnosis not present

## 2021-01-22 DIAGNOSIS — D649 Anemia, unspecified: Secondary | ICD-10-CM | POA: Diagnosis not present

## 2021-01-22 DIAGNOSIS — Z96642 Presence of left artificial hip joint: Secondary | ICD-10-CM | POA: Diagnosis not present

## 2021-01-22 DIAGNOSIS — I69322 Dysarthria following cerebral infarction: Secondary | ICD-10-CM | POA: Diagnosis not present

## 2021-01-22 DIAGNOSIS — I69354 Hemiplegia and hemiparesis following cerebral infarction affecting left non-dominant side: Secondary | ICD-10-CM | POA: Diagnosis not present

## 2021-01-23 DIAGNOSIS — E119 Type 2 diabetes mellitus without complications: Secondary | ICD-10-CM | POA: Diagnosis not present

## 2021-01-23 DIAGNOSIS — I69322 Dysarthria following cerebral infarction: Secondary | ICD-10-CM | POA: Diagnosis not present

## 2021-01-23 DIAGNOSIS — D649 Anemia, unspecified: Secondary | ICD-10-CM | POA: Diagnosis not present

## 2021-01-23 DIAGNOSIS — M109 Gout, unspecified: Secondary | ICD-10-CM | POA: Diagnosis not present

## 2021-01-23 DIAGNOSIS — Z85048 Personal history of other malignant neoplasm of rectum, rectosigmoid junction, and anus: Secondary | ICD-10-CM | POA: Diagnosis not present

## 2021-01-23 DIAGNOSIS — I69354 Hemiplegia and hemiparesis following cerebral infarction affecting left non-dominant side: Secondary | ICD-10-CM | POA: Diagnosis not present

## 2021-01-23 DIAGNOSIS — Z6834 Body mass index (BMI) 34.0-34.9, adult: Secondary | ICD-10-CM | POA: Diagnosis not present

## 2021-01-23 DIAGNOSIS — Z96642 Presence of left artificial hip joint: Secondary | ICD-10-CM | POA: Diagnosis not present

## 2021-01-23 DIAGNOSIS — G40909 Epilepsy, unspecified, not intractable, without status epilepticus: Secondary | ICD-10-CM | POA: Diagnosis not present

## 2021-01-23 DIAGNOSIS — Z9049 Acquired absence of other specified parts of digestive tract: Secondary | ICD-10-CM | POA: Diagnosis not present

## 2021-01-23 DIAGNOSIS — E669 Obesity, unspecified: Secondary | ICD-10-CM | POA: Diagnosis not present

## 2021-01-23 DIAGNOSIS — I4891 Unspecified atrial fibrillation: Secondary | ICD-10-CM | POA: Diagnosis not present

## 2021-01-23 DIAGNOSIS — L89894 Pressure ulcer of other site, stage 4: Secondary | ICD-10-CM | POA: Diagnosis not present

## 2021-01-23 DIAGNOSIS — E785 Hyperlipidemia, unspecified: Secondary | ICD-10-CM | POA: Diagnosis not present

## 2021-01-23 DIAGNOSIS — I69328 Other speech and language deficits following cerebral infarction: Secondary | ICD-10-CM | POA: Diagnosis not present

## 2021-01-23 DIAGNOSIS — I10 Essential (primary) hypertension: Secondary | ICD-10-CM | POA: Diagnosis not present

## 2021-01-24 DIAGNOSIS — M109 Gout, unspecified: Secondary | ICD-10-CM | POA: Diagnosis not present

## 2021-01-24 DIAGNOSIS — Z85048 Personal history of other malignant neoplasm of rectum, rectosigmoid junction, and anus: Secondary | ICD-10-CM | POA: Diagnosis not present

## 2021-01-24 DIAGNOSIS — I69354 Hemiplegia and hemiparesis following cerebral infarction affecting left non-dominant side: Secondary | ICD-10-CM | POA: Diagnosis not present

## 2021-01-24 DIAGNOSIS — E785 Hyperlipidemia, unspecified: Secondary | ICD-10-CM | POA: Diagnosis not present

## 2021-01-24 DIAGNOSIS — E119 Type 2 diabetes mellitus without complications: Secondary | ICD-10-CM | POA: Diagnosis not present

## 2021-01-24 DIAGNOSIS — Z9049 Acquired absence of other specified parts of digestive tract: Secondary | ICD-10-CM | POA: Diagnosis not present

## 2021-01-24 DIAGNOSIS — I69322 Dysarthria following cerebral infarction: Secondary | ICD-10-CM | POA: Diagnosis not present

## 2021-01-24 DIAGNOSIS — G40909 Epilepsy, unspecified, not intractable, without status epilepticus: Secondary | ICD-10-CM | POA: Diagnosis not present

## 2021-01-24 DIAGNOSIS — I10 Essential (primary) hypertension: Secondary | ICD-10-CM | POA: Diagnosis not present

## 2021-01-24 DIAGNOSIS — I69328 Other speech and language deficits following cerebral infarction: Secondary | ICD-10-CM | POA: Diagnosis not present

## 2021-01-24 DIAGNOSIS — E669 Obesity, unspecified: Secondary | ICD-10-CM | POA: Diagnosis not present

## 2021-01-24 DIAGNOSIS — Z6834 Body mass index (BMI) 34.0-34.9, adult: Secondary | ICD-10-CM | POA: Diagnosis not present

## 2021-01-24 DIAGNOSIS — L89894 Pressure ulcer of other site, stage 4: Secondary | ICD-10-CM | POA: Diagnosis not present

## 2021-01-24 DIAGNOSIS — D649 Anemia, unspecified: Secondary | ICD-10-CM | POA: Diagnosis not present

## 2021-01-24 DIAGNOSIS — I4891 Unspecified atrial fibrillation: Secondary | ICD-10-CM | POA: Diagnosis not present

## 2021-01-24 DIAGNOSIS — Z96642 Presence of left artificial hip joint: Secondary | ICD-10-CM | POA: Diagnosis not present

## 2021-01-24 NOTE — Telephone Encounter (Signed)
Call returned to [name]/ [facility].  She explained that she is a medical records specialist. [facility] placed the order for the wheelchair based on the recommendation from the PT that saw the patient at home.  [facility] does not have a record of the order until it is signed by the provider even though the fax comes from [place].  This is based on the system that [facility] uses to place orders for DME with [facility].

## 2021-01-24 NOTE — Telephone Encounter (Signed)
Caryl Pina calling from [facility] has questions for clinic regarding wheelchair order that was faxed. Please advise  [phone] extension [phone]

## 2021-01-25 ENCOUNTER — Telehealth: Payer: Self-pay | Admitting: Internal Medicine

## 2021-01-25 NOTE — Telephone Encounter (Signed)
Copied from [place] [phone]. Topic: Quick Communication - Home Health Verbal Orders >> Jan 23, 2021  2:04 PM Pawlus, Brayton Layman A wrote: Caller/Agency: [facility]  Callback Number: [phone] Requesting: OT Frequency: 1x3

## 2021-01-25 NOTE — Telephone Encounter (Signed)
Returned Steph from [place] call and lvm giving verbal orders

## 2021-01-28 ENCOUNTER — Other Ambulatory Visit: Payer: Self-pay

## 2021-01-28 DIAGNOSIS — Z993 Dependence on wheelchair: Secondary | ICD-10-CM | POA: Diagnosis not present

## 2021-01-28 DIAGNOSIS — I69398 Other sequelae of cerebral infarction: Secondary | ICD-10-CM | POA: Diagnosis not present

## 2021-01-28 DIAGNOSIS — Z9181 History of falling: Secondary | ICD-10-CM | POA: Diagnosis not present

## 2021-01-28 DIAGNOSIS — I69354 Hemiplegia and hemiparesis following cerebral infarction affecting left non-dominant side: Secondary | ICD-10-CM | POA: Diagnosis not present

## 2021-01-29 ENCOUNTER — Encounter (HOSPITAL_BASED_OUTPATIENT_CLINIC_OR_DEPARTMENT_OTHER): Payer: BC Managed Care – PPO | Admitting: Internal Medicine

## 2021-01-29 DIAGNOSIS — Z85048 Personal history of other malignant neoplasm of rectum, rectosigmoid junction, and anus: Secondary | ICD-10-CM | POA: Diagnosis not present

## 2021-01-29 DIAGNOSIS — L89894 Pressure ulcer of other site, stage 4: Secondary | ICD-10-CM | POA: Diagnosis not present

## 2021-01-29 DIAGNOSIS — G40909 Epilepsy, unspecified, not intractable, without status epilepticus: Secondary | ICD-10-CM | POA: Diagnosis not present

## 2021-01-29 DIAGNOSIS — I69322 Dysarthria following cerebral infarction: Secondary | ICD-10-CM | POA: Diagnosis not present

## 2021-01-29 DIAGNOSIS — D649 Anemia, unspecified: Secondary | ICD-10-CM | POA: Diagnosis not present

## 2021-01-29 DIAGNOSIS — Z96642 Presence of left artificial hip joint: Secondary | ICD-10-CM | POA: Diagnosis not present

## 2021-01-29 DIAGNOSIS — I69354 Hemiplegia and hemiparesis following cerebral infarction affecting left non-dominant side: Secondary | ICD-10-CM | POA: Diagnosis not present

## 2021-01-29 DIAGNOSIS — E119 Type 2 diabetes mellitus without complications: Secondary | ICD-10-CM | POA: Diagnosis not present

## 2021-01-29 DIAGNOSIS — M109 Gout, unspecified: Secondary | ICD-10-CM | POA: Diagnosis not present

## 2021-01-29 DIAGNOSIS — Z9049 Acquired absence of other specified parts of digestive tract: Secondary | ICD-10-CM | POA: Diagnosis not present

## 2021-01-29 DIAGNOSIS — I10 Essential (primary) hypertension: Secondary | ICD-10-CM | POA: Diagnosis not present

## 2021-01-29 DIAGNOSIS — Z6834 Body mass index (BMI) 34.0-34.9, adult: Secondary | ICD-10-CM | POA: Diagnosis not present

## 2021-01-29 DIAGNOSIS — E669 Obesity, unspecified: Secondary | ICD-10-CM | POA: Diagnosis not present

## 2021-01-29 DIAGNOSIS — I4891 Unspecified atrial fibrillation: Secondary | ICD-10-CM | POA: Diagnosis not present

## 2021-01-29 DIAGNOSIS — E785 Hyperlipidemia, unspecified: Secondary | ICD-10-CM | POA: Diagnosis not present

## 2021-01-29 DIAGNOSIS — I69328 Other speech and language deficits following cerebral infarction: Secondary | ICD-10-CM | POA: Diagnosis not present

## 2021-01-30 ENCOUNTER — Other Ambulatory Visit (HOSPITAL_COMMUNITY): Payer: Self-pay

## 2021-01-30 DIAGNOSIS — Z6834 Body mass index (BMI) 34.0-34.9, adult: Secondary | ICD-10-CM | POA: Diagnosis not present

## 2021-01-30 DIAGNOSIS — M109 Gout, unspecified: Secondary | ICD-10-CM | POA: Diagnosis not present

## 2021-01-30 DIAGNOSIS — Z85048 Personal history of other malignant neoplasm of rectum, rectosigmoid junction, and anus: Secondary | ICD-10-CM | POA: Diagnosis not present

## 2021-01-30 DIAGNOSIS — L89894 Pressure ulcer of other site, stage 4: Secondary | ICD-10-CM | POA: Diagnosis not present

## 2021-01-30 DIAGNOSIS — E669 Obesity, unspecified: Secondary | ICD-10-CM | POA: Diagnosis not present

## 2021-01-30 DIAGNOSIS — I10 Essential (primary) hypertension: Secondary | ICD-10-CM | POA: Diagnosis not present

## 2021-01-30 DIAGNOSIS — Z9049 Acquired absence of other specified parts of digestive tract: Secondary | ICD-10-CM | POA: Diagnosis not present

## 2021-01-30 DIAGNOSIS — G40909 Epilepsy, unspecified, not intractable, without status epilepticus: Secondary | ICD-10-CM | POA: Diagnosis not present

## 2021-01-30 DIAGNOSIS — I69328 Other speech and language deficits following cerebral infarction: Secondary | ICD-10-CM | POA: Diagnosis not present

## 2021-01-30 DIAGNOSIS — E119 Type 2 diabetes mellitus without complications: Secondary | ICD-10-CM | POA: Diagnosis not present

## 2021-01-30 DIAGNOSIS — Z96642 Presence of left artificial hip joint: Secondary | ICD-10-CM | POA: Diagnosis not present

## 2021-01-30 DIAGNOSIS — I69322 Dysarthria following cerebral infarction: Secondary | ICD-10-CM | POA: Diagnosis not present

## 2021-01-30 DIAGNOSIS — I4891 Unspecified atrial fibrillation: Secondary | ICD-10-CM | POA: Diagnosis not present

## 2021-01-30 DIAGNOSIS — I69354 Hemiplegia and hemiparesis following cerebral infarction affecting left non-dominant side: Secondary | ICD-10-CM | POA: Diagnosis not present

## 2021-01-30 DIAGNOSIS — D649 Anemia, unspecified: Secondary | ICD-10-CM | POA: Diagnosis not present

## 2021-01-30 DIAGNOSIS — E785 Hyperlipidemia, unspecified: Secondary | ICD-10-CM | POA: Diagnosis not present

## 2021-01-30 NOTE — Telephone Encounter (Signed)
This information about the wheelchair order was shared with Dr Wynetta Emery and per Ebbie Latus, CMA, the order has been signed and faxed back to [facility]

## 2021-01-31 ENCOUNTER — Encounter (HOSPITAL_BASED_OUTPATIENT_CLINIC_OR_DEPARTMENT_OTHER): Payer: BC Managed Care – PPO | Admitting: Internal Medicine

## 2021-01-31 ENCOUNTER — Other Ambulatory Visit: Payer: Self-pay

## 2021-01-31 DIAGNOSIS — I69328 Other speech and language deficits following cerebral infarction: Secondary | ICD-10-CM | POA: Diagnosis not present

## 2021-01-31 DIAGNOSIS — E785 Hyperlipidemia, unspecified: Secondary | ICD-10-CM | POA: Diagnosis not present

## 2021-01-31 DIAGNOSIS — I69359 Hemiplegia and hemiparesis following cerebral infarction affecting unspecified side: Secondary | ICD-10-CM

## 2021-01-31 DIAGNOSIS — L89894 Pressure ulcer of other site, stage 4: Secondary | ICD-10-CM | POA: Diagnosis not present

## 2021-01-31 DIAGNOSIS — I69354 Hemiplegia and hemiparesis following cerebral infarction affecting left non-dominant side: Secondary | ICD-10-CM | POA: Diagnosis not present

## 2021-01-31 DIAGNOSIS — G8220 Paraplegia, unspecified: Secondary | ICD-10-CM | POA: Diagnosis not present

## 2021-01-31 DIAGNOSIS — E119 Type 2 diabetes mellitus without complications: Secondary | ICD-10-CM | POA: Diagnosis not present

## 2021-01-31 DIAGNOSIS — L89153 Pressure ulcer of sacral region, stage 3: Secondary | ICD-10-CM | POA: Diagnosis not present

## 2021-01-31 DIAGNOSIS — G40909 Epilepsy, unspecified, not intractable, without status epilepticus: Secondary | ICD-10-CM | POA: Diagnosis not present

## 2021-01-31 DIAGNOSIS — Z96642 Presence of left artificial hip joint: Secondary | ICD-10-CM | POA: Diagnosis not present

## 2021-01-31 DIAGNOSIS — Z85048 Personal history of other malignant neoplasm of rectum, rectosigmoid junction, and anus: Secondary | ICD-10-CM | POA: Diagnosis not present

## 2021-01-31 DIAGNOSIS — E669 Obesity, unspecified: Secondary | ICD-10-CM | POA: Diagnosis not present

## 2021-01-31 DIAGNOSIS — Z9049 Acquired absence of other specified parts of digestive tract: Secondary | ICD-10-CM | POA: Diagnosis not present

## 2021-01-31 DIAGNOSIS — Z6834 Body mass index (BMI) 34.0-34.9, adult: Secondary | ICD-10-CM | POA: Diagnosis not present

## 2021-01-31 DIAGNOSIS — I69322 Dysarthria following cerebral infarction: Secondary | ICD-10-CM | POA: Diagnosis not present

## 2021-01-31 DIAGNOSIS — M109 Gout, unspecified: Secondary | ICD-10-CM | POA: Diagnosis not present

## 2021-01-31 DIAGNOSIS — N182 Chronic kidney disease, stage 2 (mild): Secondary | ICD-10-CM | POA: Diagnosis not present

## 2021-01-31 DIAGNOSIS — E1165 Type 2 diabetes mellitus with hyperglycemia: Secondary | ICD-10-CM

## 2021-01-31 DIAGNOSIS — I10 Essential (primary) hypertension: Secondary | ICD-10-CM | POA: Diagnosis not present

## 2021-01-31 DIAGNOSIS — I4891 Unspecified atrial fibrillation: Secondary | ICD-10-CM | POA: Diagnosis not present

## 2021-01-31 DIAGNOSIS — E11622 Type 2 diabetes mellitus with other skin ulcer: Secondary | ICD-10-CM | POA: Diagnosis not present

## 2021-01-31 DIAGNOSIS — E1122 Type 2 diabetes mellitus with diabetic chronic kidney disease: Secondary | ICD-10-CM | POA: Diagnosis not present

## 2021-01-31 DIAGNOSIS — I129 Hypertensive chronic kidney disease with stage 1 through stage 4 chronic kidney disease, or unspecified chronic kidney disease: Secondary | ICD-10-CM | POA: Diagnosis not present

## 2021-01-31 DIAGNOSIS — D649 Anemia, unspecified: Secondary | ICD-10-CM | POA: Diagnosis not present

## 2021-02-02 ENCOUNTER — Other Ambulatory Visit: Payer: Self-pay | Admitting: Internal Medicine

## 2021-02-02 DIAGNOSIS — F320 Major depressive disorder, single episode, mild: Secondary | ICD-10-CM

## 2021-02-02 NOTE — Telephone Encounter (Signed)
Requested medication (s) are due for refill today: yes  Requested medication (s) are on the active medication list: yes  Last refill:  12/25/20 #30  Future visit scheduled: yes  Notes to clinic:  pt wanting 90 day refills   Requested Prescriptions  Pending Prescriptions Disp Refills   citalopram (CELEXA) 20 MG tablet [Pharmacy Med Name: CITALOPRAM HBR 20 MG TABLET] 90 tablet 2    Sig: TAKE 1 [place]     Psychiatry:  Antidepressants - SSRI Passed - 02/02/2021  1:31 PM      Passed - Completed PHQ-2 or PHQ-9 in the last 360 days      Passed - Valid encounter within last 6 months    Recent Outpatient Visits           3 weeks ago [place] Ladell Pier, MD   1 month ago Hospital discharge follow-up   [place] Ladell Pier, MD   3 months ago Pain of right upper extremity   [place], Deborah B, MD   3 months ago Controlled type 2 diabetes mellitus with other circulatory complication, without long-term current use of insulin [facility])   [place] Karle Plumber B, MD   8 months ago Controlled type 2 diabetes mellitus with other circulatory complication, without long-term current use of insulin [facility])   [place], Deborah B, MD       Future Appointments             In 2 weeks Ladell Pier, MD [place]

## 2021-02-04 ENCOUNTER — Ambulatory Visit: Payer: Self-pay

## 2021-02-04 DIAGNOSIS — E785 Hyperlipidemia, unspecified: Secondary | ICD-10-CM | POA: Diagnosis not present

## 2021-02-04 DIAGNOSIS — E669 Obesity, unspecified: Secondary | ICD-10-CM | POA: Diagnosis not present

## 2021-02-04 DIAGNOSIS — Z85048 Personal history of other malignant neoplasm of rectum, rectosigmoid junction, and anus: Secondary | ICD-10-CM | POA: Diagnosis not present

## 2021-02-04 DIAGNOSIS — Z96642 Presence of left artificial hip joint: Secondary | ICD-10-CM | POA: Diagnosis not present

## 2021-02-04 DIAGNOSIS — E119 Type 2 diabetes mellitus without complications: Secondary | ICD-10-CM | POA: Diagnosis not present

## 2021-02-04 DIAGNOSIS — I69322 Dysarthria following cerebral infarction: Secondary | ICD-10-CM | POA: Diagnosis not present

## 2021-02-04 DIAGNOSIS — L89894 Pressure ulcer of other site, stage 4: Secondary | ICD-10-CM | POA: Diagnosis not present

## 2021-02-04 DIAGNOSIS — I69354 Hemiplegia and hemiparesis following cerebral infarction affecting left non-dominant side: Secondary | ICD-10-CM | POA: Diagnosis not present

## 2021-02-04 DIAGNOSIS — I69328 Other speech and language deficits following cerebral infarction: Secondary | ICD-10-CM | POA: Diagnosis not present

## 2021-02-04 DIAGNOSIS — H814 Vertigo of central origin: Secondary | ICD-10-CM

## 2021-02-04 DIAGNOSIS — Z9049 Acquired absence of other specified parts of digestive tract: Secondary | ICD-10-CM | POA: Diagnosis not present

## 2021-02-04 DIAGNOSIS — Z6834 Body mass index (BMI) 34.0-34.9, adult: Secondary | ICD-10-CM | POA: Diagnosis not present

## 2021-02-04 DIAGNOSIS — I10 Essential (primary) hypertension: Secondary | ICD-10-CM | POA: Diagnosis not present

## 2021-02-04 DIAGNOSIS — D649 Anemia, unspecified: Secondary | ICD-10-CM | POA: Diagnosis not present

## 2021-02-04 DIAGNOSIS — M109 Gout, unspecified: Secondary | ICD-10-CM | POA: Diagnosis not present

## 2021-02-04 DIAGNOSIS — G40909 Epilepsy, unspecified, not intractable, without status epilepticus: Secondary | ICD-10-CM | POA: Diagnosis not present

## 2021-02-04 DIAGNOSIS — I4891 Unspecified atrial fibrillation: Secondary | ICD-10-CM | POA: Diagnosis not present

## 2021-02-04 NOTE — Telephone Encounter (Signed)
[name], PT from [facility] doing vestibular rehab for positional vertigo. She says his nistagmus is concerning to her when she saw him last week. She says the dizziness he has is preventing him from getting OOB and transferring. She asked if the brain tumors he has is mentioned in the history on file and wanted to know the last CT or Brain scan he's had. I advised Chronic brain mass is mentioned in the last OV note 01/10/21 history, last MRI brain 12/11/20 and nothing acute was found. I advised a triage nurse already spoke to him about the dizziness and based on the assessment, an OV is scheduled for tomorrow. She says she is concerned and wants to know if tomorrow the provider could assess his nystigmus and see if there are worsening neurologic deficits that may need a neurology referral. She says the patient seems to think the dizziness is medication related to him taking Amiodarone or Carvedilol. She also says that she's on the way to his home to do therapy. I advised if she's noticing anything that warrants emergent evaluation to call 911, she verbalized understanding.

## 2021-02-04 NOTE — Telephone Encounter (Signed)
°  Chief Complaint: dizziness and vertigo Symptoms: feels room or and pt are spinning Frequency: since November 2022 Pertinent Negatives: Patient denies headache, vomiting, earache, numbness Disposition: [] ED /[] Urgent Care (no appt availability in office) / [x] Appointment(In office/virtual)/ []  [facility]/ [] Home Care/ [] Refused Recommended Disposition /[] [facility]/ []  Follow-up with PCP Additional Notes: VV made for tomorrow. Please review note to make sure disposition is appropriate. Pt was seen for same sx 01/10/21- pt h/o CVA Sent teams message to Selina Cooley to review triage.     Reason for Disposition  [1] MILD dizziness (e.g., vertigo; walking normally) AND [2] has been evaluated by physician for this  Answer Assessment - Initial Assessment Questions 1. DESCRIPTION: "Describe your dizziness."     Yes lays down goes away after alyng 2. VERTIGO: "Do you feel like either you or the room is spinning or tilting?"      yes 3. LIGHTHEADED: "Do you feel lightheaded?" (e.g., somewhat faint, woozy, weak upon standing)     no 4. SEVERITY: "How bad is it?"  "Can you walk?"   - MILD: Feels slightly dizzy and unsteady, but is walking normally.   - MODERATE: Feels unsteady when walking, but not falling; interferes with normal activities (e.g., school, work).   - SEVERE: Unable to walk without falling, or requires assistance to walk without falling.     mild 5. ONSET:  "When did the dizziness begin?"    12/22 6. AGGRAVATING FACTORS: "Does anything make it worse?" (e.g., standing, change in head position)     standing 7. CAUSE: "What do you think is causing the dizziness?"     Dizziness: SE of medication cannot remember what it is called 8. RECURRENT SYMPTOM: "Have you had dizziness before?" If Yes, ask: "When was the last time?" "What happened that time?"     Yes-2 years-went away on its own 9. OTHER SYMPTOMS: "Do you have any other symptoms?" (e.g., headache,  weakness, numbness, vomiting, earache)     no 10. PREGNANCY: "Is there any chance you are pregnant?" "When was your last menstrual period?"       *No Answer*  Protocols used: Dizziness - Vertigo-A-AH

## 2021-02-04 NOTE — Progress Notes (Signed)
[name], [name] ([id number]) Visit Report for 01/31/2021 Arrival Information Details Patient Name: Date of Service: [name] IN D. 01/31/2021 8:15 A M Medical Record Number: [id number] Patient Account Number: [id number] Date of Birth/Sex: Treating RN: Jan 10, 1966 (56 y.o. [name] Primary Care Tessy Pawelski: Karle Plumber Other Clinician: Referring Romar Woodrick: Treating Arda Daggs/Extender: Sammuel Bailiff in Treatment: 1 Visit Information History Since Last Visit Added or deleted any medications: No Patient Arrived: Wheel Chair Any new allergies or adverse reactions: No Arrival Time: 08:09 Had a fall or experienced change in No Accompanied By: Wife activities of daily living that may affect Transfer Assistance: Manual risk of falls: Patient Identification Verified: Yes Signs or symptoms of abuse/neglect since last visito No Secondary Verification Process Completed: Yes Hospitalized since last visit: No Patient Requires Transmission-Based Precautions: No Implantable device outside of the clinic excluding No Patient Has Alerts: No cellular tissue based products placed in the center since last visit: Has Dressing in Place as Prescribed: Yes Pain Present Now: No Electronic Signature(s) Signed: 01/31/2021 5:38:49 PM By: Lorrin Jackson Entered By: Lorrin Jackson on 01/31/2021 08:13:33 -------------------------------------------------------------------------------- Encounter Discharge Information Details Patient Name: Date of Service: [name], [place] D. 01/31/2021 8:15 A M Medical Record Number: [id number] Patient Account Number: [id number] Date of Birth/Sex: Treating RN: 1965-09-19 (56 y.o. [name] Primary Care Siddhartha Hoback: Karle Plumber Other Clinician: Referring Shaunn Tackitt: Treating Henryetta Corriveau/Extender: Sammuel Bailiff in Treatment: 1 Encounter Discharge Information Items Discharge Condition: Stable Ambulatory  Status: Wheelchair Discharge Destination: Home Transportation: Private Auto Accompanied By: spouse Schedule Follow-up Appointment: Yes Clinical Summary of Care: Patient Declined Electronic Signature(s) Signed: 02/04/2021 5:11:23 PM By: Levan Hurst RN, BSN Entered By: Levan Hurst on 01/31/2021 12:02:55 -------------------------------------------------------------------------------- Lower Extremity Assessment Details Patient Name: Date of Service: [name] IN D. 01/31/2021 8:15 A M Medical Record Number: [id number] Patient Account Number: [id number] Date of Birth/Sex: Treating RN: 1965-09-24 (56 y.o. [name] Primary Care Margerie Fraiser: Karle Plumber Other Clinician: Referring Anne-Marie Genson: Treating Ezana Hubbert/Extender: Sammuel Bailiff in Treatment: 1 Electronic Signature(s) Signed: 01/31/2021 5:38:49 PM By: Lorrin Jackson Entered By: Lorrin Jackson on 01/31/2021 08:10:49 -------------------------------------------------------------------------------- Multi Wound Chart Details Patient Name: Date of Service: [name], [name] IN D. 01/31/2021 8:15 A M Medical Record Number: [id number] Patient Account Number: [id number] Date of Birth/Sex: Treating RN: [birth year] (56 y.o. [name] Primary Care Jashay Roddy: Karle Plumber Other Clinician: Referring Adaly Puder: Treating Anastashia Westerfeld/Extender: Sammuel Bailiff in Treatment: 1 Vital Signs Height(in): 64 Pulse(bpm): 78 Weight(lbs): Blood Pressure(mmHg): 134/89 Body Mass Index(BMI): Temperature(F): 99.3 Respiratory Rate(breaths/min): 18 Photos: [1:No Photos Sacrum] [N/A:N/A N/A] Wound Location: [1:Pressure Injury] [N/A:N/A] Wounding Event: [1:Pressure Ulcer] [N/A:N/A] Primary Etiology: [1:Hypertension, Type II Diabetes,] [N/A:N/A] Comorbid History: [1:Paraplegia, Seizure Disorder 11/13/2020] [N/A:N/A] Date Acquired: [1:1] [N/A:N/A] Weeks of Treatment: [1:Open]  [N/A:N/A] Wound Status: [1:3.2x1.1x1.5] [N/A:N/A] Measurements L x W x D (cm) [1:2.765] [N/A:N/A] A (cm) : rea [1:4.147] [N/A:N/A] Volume (cm) : [1:59.80%] [N/A:N/A] % Reduction in A rea: [1:62.30%] [N/A:N/A] % Reduction in Volume: [1:Category/Stage III] [N/A:N/A] Classification: [1:Medium] [N/A:N/A] Exudate A mount: [1:Serosanguineous] [N/A:N/A] Exudate Type: [1:red, brown] [N/A:N/A] Exudate Color: [1:Distinct, outline attached] [N/A:N/A] Wound Margin: [1:Large (67-100%)] [N/A:N/A] Granulation A mount: [1:Red, Pink, Hyper-granulation] [N/A:N/A] Granulation Quality: [1:Small (1-33%)] [N/A:N/A] Necrotic A mount: [1:Fat Layer (Subcutaneous Tissue): Yes N/A] Exposed Structures: [1:Fascia: No Tendon: No Muscle: No Joint: No Bone: No Small (1-33%)] [N/A:N/A] Epithelialization: [1:Chemical Cauterization] [N/A:N/A] Treatment Notes Electronic Signature(s) Signed: 01/31/2021 9:41:11 AM By: Kalman Shan DO Signed: 02/04/2021  5:11:23 PM By: Levan Hurst RN, BSN Entered By: Kalman Shan on 01/31/2021 09:36:15 -------------------------------------------------------------------------------- Multi-Disciplinary Care Plan Details Patient Name: Date of Service: [name], [place] D. 01/31/2021 8:15 A M Medical Record Number: [id number] Patient Account Number: [id number] Date of Birth/Sex: Treating RN: 09/20/1965 (56 y.o. [name] Primary Care Mylia Pondexter: Karle Plumber Other Clinician: Referring Benjamin Merrihew: Treating Galvin Aversa/Extender: Sammuel Bailiff in Treatment: 1 Active Inactive Abuse / Safety / Falls / Self Care Management Nursing Diagnoses: Potential for falls Potential for injury related to falls Goals: Patient will remain injury free related to falls Date Initiated: 01/18/2021 Target Resolution Date: 03/22/2021 Goal Status: Active Patient/caregiver will verbalize/demonstrate measure taken to improve self care Date Initiated: 01/18/2021 Target  Resolution Date: 02/15/2021 Goal Status: Active Interventions: Assess: immobility, friction, shearing, incontinence upon admission and as needed Assess self care needs on admission and as needed Notes: Orientation to the Wound Care Program Nursing Diagnoses: Knowledge deficit related to the wound healing center program Goals: Patient/caregiver will verbalize understanding of the [place] Date Initiated: 01/18/2021 Target Resolution Date: 02/01/2021 Goal Status: Active Interventions: Provide education on orientation to the wound center Notes: Pain, Acute or Chronic Nursing Diagnoses: Pain, acute or chronic: actual or potential Potential alteration in comfort, pain Goals: Patient will verbalize adequate pain control and receive pain control interventions during procedures as needed Date Initiated: 01/18/2021 Target Resolution Date: 02/15/2021 Goal Status: Active Patient/caregiver will verbalize comfort level met Date Initiated: 01/18/2021 Target Resolution Date: 02/15/2021 Goal Status: Active Interventions: Provide education on pain management Reposition patient for comfort Treatment Activities: Administer pain control measures as ordered : 01/18/2021 Notes: Pressure Nursing Diagnoses: Knowledge deficit related to causes and risk factors for pressure ulcer development Potential for impaired tissue integrity related to pressure, friction, moisture, and shear Goals: Patient will remain free from development of additional pressure ulcers Date Initiated: 01/18/2021 Target Resolution Date: 03/22/2021 Goal Status: Active Patient/caregiver will verbalize risk factors for pressure ulcer development Date Initiated: 01/18/2021 Target Resolution Date: 03/22/2021 Goal Status: Active Interventions: Assess: immobility, friction, shearing, incontinence upon admission and as needed Assess offloading mechanisms upon admission and as needed Assess potential for pressure ulcer upon  admission and as needed Provide education on pressure ulcers Treatment Activities: Patient referred for home evaluation of offloading devices/mattresses : 01/18/2021 Patient referred for pressure reduction/relief devices : 01/18/2021 Pressure reduction/relief device ordered : 01/18/2021 Notes: Wound/Skin Impairment Nursing Diagnoses: Knowledge deficit related to ulceration/compromised skin integrity Goals: Patient/caregiver will verbalize understanding of skin care regimen Date Initiated: 01/18/2021 Target Resolution Date: 02/15/2021 Goal Status: Active Interventions: Assess patient/caregiver ability to perform ulcer/skin care regimen upon admission and as needed Assess ulceration(s) every visit Provide education on ulcer and skin care Treatment Activities: Skin care regimen initiated : 01/18/2021 Topical wound management initiated : 01/18/2021 Notes: Electronic Signature(s) Signed: 01/31/2021 5:38:49 PM By: Lorrin Jackson Entered By: Lorrin Jackson on 01/31/2021 08:11:20 -------------------------------------------------------------------------------- Pain Assessment Details Patient Name: Date of Service: [name] IN D. 01/31/2021 8:15 A M Medical Record Number: [id number] Patient Account Number: [id number] Date of Birth/Sex: Treating RN: 1965-04-30 (56 y.o. [name] Primary Care Camila Maita: Karle Plumber Other Clinician: Referring Berman Grainger: Treating Melika Reder/Extender: Sammuel Bailiff in Treatment: 1 Active Problems Location of Pain Severity and Description of Pain Patient Has Paino No Site Locations Pain Management and Medication Current Pain Management: Electronic Signature(s) Signed: 01/31/2021 5:38:49 PM By: Lorrin Jackson Entered By: Lorrin Jackson on 01/31/2021 08:10:43 -------------------------------------------------------------------------------- Patient/Caregiver Education Details Patient Name: Date of  Service: [name] [place]  D. 1/19/2023andnbsp8:15 A M Medical Record Number: [id number] Patient Account Number: [id number] Date of Birth/Gender: Treating RN: 10-01-1965 (56 y.o. [name] Primary Care Physician: Karle Plumber Other Clinician: Referring Physician: Treating Physician/Extender: Sammuel Bailiff in Treatment: 1 Education Assessment Education Provided To: Patient Education Topics Provided Pressure: Methods: Explain/Verbal, Printed Responses: State content correctly Wound/Skin Impairment: Methods: Demonstration, Explain/Verbal, Printed Responses: State content correctly Electronic Signature(s) Signed: 01/31/2021 5:38:49 PM By: Lorrin Jackson Entered By: Lorrin Jackson on 01/31/2021 08:11:47 -------------------------------------------------------------------------------- Wound Assessment Details Patient Name: Date of Service: [name] IN D. 01/31/2021 8:15 A M Medical Record Number: [id number] Patient Account Number: [id number] Date of Birth/Sex: Treating RN: [birth year] (56 y.o. [name] Primary Care Aritzel Krusemark: Karle Plumber Other Clinician: Referring Bohden Dung: Treating Antino Mayabb/Extender: Sammuel Bailiff in Treatment: 1 Wound Status Wound Number: 1 Primary Etiology: Pressure Ulcer Wound Location: Sacrum Wound Status: Open Wounding Event: Pressure Injury Comorbid Hypertension, Type II Diabetes, Paraplegia, Seizure History: Disorder Date Acquired: 11/13/2020 Weeks Of Treatment: 1 Clustered Wound: No Wound Measurements Length: (cm) 3.2 Width: (cm) 1.1 Depth: (cm) 1.5 Area: (cm) 2.765 Volume: (cm) 4.147 % Reduction in Area: 59.8% % Reduction in Volume: 62.3% Epithelialization: Small (1-33%) Wound Description Classification: Category/Stage III Wound Margin: Distinct, outline attached Exudate Amount: Medium Exudate Type: Serosanguineous Exudate Color: red, brown Foul Odor After Cleansing:  No Slough/Fibrino Yes Wound Bed Granulation Amount: Large (67-100%) Exposed Structure Granulation Quality: Red, Pink, Hyper-granulation Fascia Exposed: No Necrotic Amount: Small (1-33%) Fat Layer (Subcutaneous Tissue) Exposed: Yes Necrotic Quality: Adherent Slough Tendon Exposed: No Muscle Exposed: No Joint Exposed: No Bone Exposed: No Treatment Notes Wound #1 (Sacrum) Cleanser Wound Cleanser Discharge Instruction: Cleanse the wound with wound cleanser prior to applying a clean dressing using gauze sponges, not tissue or cotton balls. Peri-Wound Care Skin Prep Discharge Instruction: Use skin prep as directed Topical Primary Dressing Hydrofera Blue Ready Foam, 4x5 in Discharge Instruction: Apply to wound bed as instructed Secondary Dressing Woven Gauze Sponge, Non-Sterile 4x4 in Discharge Instruction: Apply over primary dressing as directed. ABD Pad, 5x9 Discharge Instruction: Apply over primary dressing as directed. Secured With 66M Medipore H Soft Cloth Surgical T ape, 4 x 10 (in/yd) Discharge Instruction: Secure with tape as directed. Compression Wrap Compression Stockings Add-Ons Electronic Signature(s) Signed: 01/31/2021 5:38:49 PM By: Lorrin Jackson Entered By: Lorrin Jackson on 01/31/2021 08:19:41 -------------------------------------------------------------------------------- Vitals Details Patient Name: Date of Service: [name], [name] [place] D. 01/31/2021 8:15 A M Medical Record Number: [id number] Patient Account Number: [id number] Date of Birth/Sex: Treating RN: Aug 01, 1965 (56 y.o. [name] Primary Care Lorra Freeman: Karle Plumber Other Clinician: Referring Ahjanae Cassel: Treating Gabriellia Rempel/Extender: Sammuel Bailiff in Treatment: 1 Vital Signs Time Taken: 08:10 Temperature (F): 99.3 Height (in): 64 Pulse (bpm): 78 Respiratory Rate (breaths/min): 18 Blood Pressure (mmHg): 134/89 Reference Range: 80 - 120 mg / dl Electronic  Signature(s) Signed: 01/31/2021 5:38:49 PM By: Lorrin Jackson Entered By: Lorrin Jackson on 01/31/2021 08:10:37

## 2021-02-04 NOTE — Progress Notes (Signed)
Mark, [name] ([id number]) Visit Report for 01/31/2021 Chief Complaint Document Details Patient Name: Date of Service: [name] IN D. 01/31/2021 8:15 A M Medical Record Number: [id number] Patient Account Number: [id number] Date of Birth/Sex: Treating RN: 30-Apr-1965 (56 y.o. [name] Primary Care Provider: Karle Plumber Other Clinician: Referring Provider: Treating Provider/Extender: Sammuel Bailiff in Treatment: 1 Information Obtained from: Patient Chief Complaint Sacral ulcer Electronic Signature(s) Signed: 01/31/2021 9:41:11 AM By: Kalman Shan DO Entered By: Kalman Shan on 01/31/2021 09:36:29 -------------------------------------------------------------------------------- HPI Details Patient Name: Date of Service: [name], [name] 01/31/2021 8:15 A M Medical Record Number: [id number] Patient Account Number: [id number] Date of Birth/Sex: Treating RN: 1965/05/28 (56 y.o. [name] Primary Care Provider: Karle Plumber Other Clinician: Referring Provider: Treating Provider/Extender: Sammuel Bailiff in Treatment: 1 History of Present Illness HPI Description: Admission 01/18/2021 [name] is a 56 year old male with a past medical history of CVA with left-sided hemiparesis, language and speech deficits, hypertension, and seizure disorder that presents the clinic for a 78-month history of sacral ulcer. He was hospitalized on 11/23/2020 for new onset A. fib and GI bleed. He states that during this hospitalization he developed a pressure ulcer to his sacrum. He reports using Santyl to the wound bed twice daily. He has a group 2 air mattress and states he has been offloading the area by staying on his side. He currently denies signs of infection. He reports improvement in wound healing over the past month. 1/19; patient presents for follow-up. His pharmacy has ordered Dakin's but is not  available yet to the patient. He has continued with Santyl daily. He has no issues or complaints today. He denies signs of infection. Electronic Signature(s) Signed: 01/31/2021 9:41:11 AM By: Kalman Shan DO Entered By: Kalman Shan on 01/31/2021 09:37:39 -------------------------------------------------------------------------------- Chemical Cauterization Details Patient Name: Date of Service: [name], [name] IN D. 01/31/2021 8:15 A M Medical Record Number: [id number] Patient Account Number: [id number] Date of Birth/Sex: Treating RN: [birth year] (56 y.o. [name] Primary Care Provider: Karle Plumber Other Clinician: Referring Provider: Treating Provider/Extender: Sammuel Bailiff in Treatment: 1 Procedure Performed for: Wound #1 Sacrum Performed By: Physician Kalman Shan, DO Post Procedure Diagnosis Same as Pre-procedure Notes silver nitrate Electronic Signature(s) Signed: 01/31/2021 9:41:11 AM By: Kalman Shan DO Signed: 02/04/2021 5:11:23 PM By: Levan Hurst RN, BSN Entered By: Levan Hurst on 01/31/2021 08:47:08 -------------------------------------------------------------------------------- Physical Exam Details Patient Name: Date of Service: [name] IN D. 01/31/2021 8:15 A M Medical Record Number: [id number] Patient Account Number: [id number] Date of Birth/Sex: Treating RN: [birth year] (56 y.o. [name] Primary Care Provider: Karle Plumber Other Clinician: Referring Provider: Treating Provider/Extender: Sammuel Bailiff in Treatment: 1 Constitutional respirations regular, non-labored and within target range for patient.Marland Kitchen Psychiatric pleasant and cooperative. Notes Sacral ulcer with hyper granulated tissue throughout. Tunneling at the 12 o'clock position. No signs of infection. Electronic Signature(s) Signed: 01/31/2021 9:41:11 AM By: Kalman Shan DO Entered By: Kalman Shan on 01/31/2021 09:39:02 -------------------------------------------------------------------------------- Physician Orders Details Patient Name: Date of Service: [name], [name] IN D. 01/31/2021 8:15 A M Medical Record Number: [id number] Patient Account Number: [id number] Date of Birth/Sex: Treating RN: 05/14/1965 (56 y.o. [name] Primary Care Provider: Karle Plumber Other Clinician: Referring Provider: Treating Provider/Extender: Sammuel Bailiff in Treatment: 1 Verbal / Phone Orders: No Diagnosis Coding ICD-10 Coding Code Description L89.153 Pressure ulcer of sacral region, stage 3 I69.359  Hemiplegia and hemiparesis following cerebral infarction affecting unspecified side E11.65 Type 2 diabetes mellitus with hyperglycemia Follow-up Appointments ppointment in 1 week. - Dr. Heber [place] Return A Off-Loading Wound #1 Sacrum Low air-loss mattress (Group 2) - continue to use. Turn and reposition every 2 hours Other: - continue to use specialty mattress. minimize in wheelchair to help aid in healing pressure wound. no pressure to sacral area to prevent worsening. Home Health Wound #1 Sacrum New wound care orders this week; continue Home Health for wound care. May utilize formulary equivalent dressing for wound treatment orders unless otherwise specified. - Change primary dressing to Hydrofera Blue Other Home Health Orders/Instructions: [facility] Wound Treatment Wound #1 - Sacrum Cleanser: Wound Cleanser ([place]) 1 x Per Day/30 Days Discharge Instructions: Cleanse the wound with wound cleanser prior to applying a clean dressing using gauze sponges, not tissue or cotton balls. Peri-Wound Care: Skin Prep (Home Health) 1 x Per Day/30 Days Discharge Instructions: Use skin prep as directed Prim Dressing: Hydrofera Blue Ready Foam, 4x5 in (Home Health) 1 x Per Day/30 Days ary Discharge Instructions: Apply to wound bed as instructed Secondary  Dressing: Woven Gauze Sponge, Non-Sterile 4x4 in (Home Health) 1 x Per Day/30 Days Discharge Instructions: Apply over primary dressing as directed. Secondary Dressing: ABD Pad, 5x9 (Home Health) 1 x Per Day/30 Days Discharge Instructions: Apply over primary dressing as directed. Secured With: 61M Medipore H Soft Cloth Surgical T ape, 4 x 10 (in/yd) (Home Health) 1 x Per Day/30 Days Discharge Instructions: Secure with tape as directed. Electronic Signature(s) Signed: 01/31/2021 9:41:11 AM By: Kalman Shan DO Entered By: Kalman Shan on 01/31/2021 09:39:37 -------------------------------------------------------------------------------- Problem List Details Patient Name: Date of Service: [name], [name] IN D. 01/31/2021 8:15 A M Medical Record Number: [id number] Patient Account Number: [id number] Date of Birth/Sex: Treating RN: [birth year] (56 y.o. [name] Primary Care Provider: Karle Plumber Other Clinician: Referring Provider: Treating Provider/Extender: Sammuel Bailiff in Treatment: 1 Active Problems ICD-10 Encounter Code Description Active Date MDM Diagnosis L89.153 Pressure ulcer of sacral region, stage 3 01/18/2021 No Yes I69.359 Hemiplegia and hemiparesis following cerebral infarction affecting unspecified 01/18/2021 No Yes side E11.65 Type 2 diabetes mellitus with hyperglycemia 01/18/2021 No Yes Inactive Problems Resolved Problems Electronic Signature(s) Signed: 01/31/2021 9:41:11 AM By: Kalman Shan DO Entered By: Kalman Shan on 01/31/2021 09:36:09 -------------------------------------------------------------------------------- Progress Note Details Patient Name: Date of Service: [name], [name] IN D. 01/31/2021 8:15 A M Medical Record Number: [id number] Patient Account Number: [id number] Date of Birth/Sex: Treating RN: 1965/11/30 (56 y.o. [name] Primary Care Provider: Karle Plumber Other Clinician: Referring  Provider: Treating Provider/Extender: Sammuel Bailiff in Treatment: 1 Subjective Chief Complaint Information obtained from Patient Sacral ulcer History of Present Illness (HPI) Admission 01/18/2021 Mr. Luverne Zerkle is a 56 year old male with a past medical history of CVA with left-sided hemiparesis, language and speech deficits, hypertension, and seizure disorder that presents the clinic for a 36-month history of sacral ulcer. He was hospitalized on 11/23/2020 for new onset A. fib and GI bleed. He states that during this hospitalization he developed a pressure ulcer to his sacrum. He reports using Santyl to the wound bed twice daily. He has a group 2 air mattress and states he has been offloading the area by staying on his side. He currently denies signs of infection. He reports improvement in wound healing over the past month. 1/19; patient presents for follow-up. His pharmacy has ordered Dakin's but is not available yet  to the patient. He has continued with Santyl daily. He has no issues or complaints today. He denies signs of infection. Patient History Information obtained from Patient, Chart. Family History Unknown History. Social History Never smoker, Marital Status - Single, Alcohol Use - Never, Drug Use - Prior History, Caffeine Use - Rarely. Medical History Eyes Denies history of Cataracts, Glaucoma, Optic Neuritis Ear/Nose/Mouth/Throat Denies history of Chronic sinus problems/congestion, Middle ear problems Respiratory Denies history of Aspiration, Asthma, Chronic Obstructive Pulmonary Disease (COPD), Pneumothorax, Sleep Apnea, Tuberculosis Cardiovascular Patient has history of Hypertension Gastrointestinal Denies history of Cirrhosis , Colitis, Crohnoos, Hepatitis A, Hepatitis B, Hepatitis C Endocrine Patient has history of Type II Diabetes Denies history of Type I Diabetes Musculoskeletal Denies history of Gout, Rheumatoid Arthritis,  Osteoarthritis, Osteomyelitis Neurologic Patient has history of Paraplegia, Seizure Disorder Denies history of Dementia, Neuropathy, Quadriplegia Medical A Surgical History Notes nd Genitourinary CKD stage 2 Objective Constitutional respirations regular, non-labored and within target range for patient.. Vitals Time Taken: 8:10 AM, Height: 64 in, Temperature: 99.3 F, Pulse: 78 bpm, Respiratory Rate: 18 breaths/min, Blood Pressure: 134/89 mmHg. Psychiatric pleasant and cooperative. General Notes: Sacral ulcer with hyper granulated tissue throughout. Tunneling at the 12 o'clock position. No signs of infection. Integumentary (Hair, Skin) Wound #1 status is Open. Original cause of wound was Pressure Injury. The date acquired was: 11/13/2020. The wound has been in treatment 1 weeks. The wound is located on the Sacrum. The wound measures 3.2cm length x 1.1cm width x 1.5cm depth; 2.765cm^2 area and 4.147cm^3 volume. There is Fat Layer (Subcutaneous Tissue) exposed. There is a medium amount of serosanguineous drainage noted. The wound margin is distinct with the outline attached to the wound base. There is large (67-100%) red, pink, hyper - granulation within the wound bed. There is a small (1-33%) amount of necrotic tissue within the wound bed including Adherent Slough. Assessment Active Problems ICD-10 Pressure ulcer of sacral region, stage 3 Hemiplegia and hemiparesis following cerebral infarction affecting unspecified side Type 2 diabetes mellitus with hyperglycemia Patient's wound has shown improvement in size since last clinic visit. I used silver nitrate to the hyper granulated areas. At this time I recommended stopping Santyl and using Hydrofera Blue. She can hold off on Dakin's for right now. No signs of infection on exam. I recommended continuing to aggressively offload the area. Follow-up in 1 week Procedures Wound #1 Pre-procedure diagnosis of Wound #1 is a Pressure Ulcer located  on the Sacrum . An Chemical Cauterization procedure was performed by Kalman Shan, DO. Post procedure Diagnosis Wound #1: Same as Pre-Procedure Notes: silver nitrate Plan Follow-up Appointments: Return Appointment in 1 week. - Dr. Heber [place] Off-Loading: Wound #1 Sacrum: Low air-loss mattress (Group 2) - continue to use. Turn and reposition every 2 hours Other: - continue to use specialty mattress. minimize in wheelchair to help aid in healing pressure wound. no pressure to sacral area to prevent worsening. Home Health: Wound #1 Sacrum: New wound care orders this week; continue Home Health for wound care. May utilize formulary equivalent dressing for wound treatment orders unless otherwise specified. - Change primary dressing to Hydrofera Blue Other Home Health Orders/Instructions: - [facility] WOUND #1: - Sacrum Wound Laterality: Cleanser: Wound Cleanser ([place]) 1 x Per Day/30 Days Discharge Instructions: Cleanse the wound with wound cleanser prior to applying a clean dressing using gauze sponges, not tissue or cotton balls. Peri-Wound Care: Skin Prep (Home Health) 1 x Per Day/30 Days Discharge Instructions: Use skin prep as directed Prim Dressing: Hydrofera  Blue Ready Foam, 4x5 in (Home Health) 1 x Per Day/30 Days ary Discharge Instructions: Apply to wound bed as instructed Secondary Dressing: Woven Gauze Sponge, Non-Sterile 4x4 in (Home Health) 1 x Per Day/30 Days Discharge Instructions: Apply over primary dressing as directed. Secondary Dressing: ABD Pad, 5x9 (Home Health) 1 x Per Day/30 Days Discharge Instructions: Apply over primary dressing as directed. Secured With: 19M Medipore H Soft Cloth Surgical T ape, 4 x 10 (in/yd) (Home Health) 1 x Per Day/30 Days Discharge Instructions: Secure with tape as directed. 1. Silver nitrate 2. Stop Santyl and start Hydrofera Blue daily 3. Follow-up in 1 week Electronic Signature(s) Signed: 01/31/2021 9:41:11 AM By: Kalman Shan  DO Entered By: Kalman Shan on 01/31/2021 09:40:38 -------------------------------------------------------------------------------- HxROS Details Patient Name: Date of Service: [name], [name] IN D. 01/31/2021 8:15 A M Medical Record Number: [id number] Patient Account Number: [id number] Date of Birth/Sex: Treating RN: Jun 06, 1965 (56 y.o. [name] Primary Care Provider: Karle Plumber Other Clinician: Referring Provider: Treating Provider/Extender: Sammuel Bailiff in Treatment: 1 Information Obtained From Patient Chart Eyes Medical History: Negative for: Cataracts; Glaucoma; Optic Neuritis Ear/Nose/Mouth/Throat Medical History: Negative for: Chronic sinus problems/congestion; Middle ear problems Respiratory Medical History: Negative for: Aspiration; Asthma; Chronic Obstructive Pulmonary Disease (COPD); Pneumothorax; Sleep Apnea; Tuberculosis Cardiovascular Medical History: Positive for: Hypertension Gastrointestinal Medical History: Negative for: Cirrhosis ; Colitis; Crohns; Hepatitis A; Hepatitis B; Hepatitis C Endocrine Medical History: Positive for: Type II Diabetes Negative for: Type I Diabetes Genitourinary Medical History: Past Medical History Notes: CKD stage 2 Musculoskeletal Medical History: Negative for: Gout; Rheumatoid Arthritis; Osteoarthritis; Osteomyelitis Neurologic Medical History: Positive for: Paraplegia; Seizure Disorder Negative for: Dementia; Neuropathy; Quadriplegia Immunizations Pneumococcal Vaccine: Received Pneumococcal Vaccination: Yes Received Pneumococcal Vaccination On or After 60th Birthday: Yes Implantable Devices None Family and Social History Unknown History: Yes; Never smoker; Marital Status - Single; Alcohol Use: Never; Drug Use: Prior History; Caffeine Use: Rarely; Financial Concerns: No; Food, Clothing or Shelter Needs: No; Support System Lacking: No; Transportation Concerns:  No Electronic Signature(s) Signed: 01/31/2021 9:41:11 AM By: Kalman Shan DO Signed: 02/04/2021 5:11:23 PM By: Levan Hurst RN, BSN Entered By: Kalman Shan on 01/31/2021 09:38:01 -------------------------------------------------------------------------------- [place] Details Patient Name: Date of Service: [name], [place] D. 01/31/2021 Medical Record Number: [id number] Patient Account Number: [id number] Date of Birth/Sex: Treating RN: 1965/10/17 (55 y.o. [name] Primary Care Provider: Karle Plumber Other Clinician: Referring Provider: Treating Provider/Extender: Sammuel Bailiff in Treatment: 1 Diagnosis Coding ICD-10 Codes Code Description [phone] Pressure ulcer of sacral region, stage 3 I69.359 Hemiplegia and hemiparesis following cerebral infarction affecting unspecified side E11.65 Type 2 diabetes mellitus with hyperglycemia Facility Procedures CPT4 Code: [id number] Description: 14481 - CHEM CAUT GRANULATION TISS ICD-10 Diagnosis Description L89.153 Pressure ulcer of sacral region, stage 3 Modifier: Quantity: 1 Physician Procedures : CPT4 Code Description Modifier [id number] - WC PHYS LEVEL 3 - EST PT ICD-10 Diagnosis Description L89.153 Pressure ulcer of sacral region, stage 3 I69.359 Hemiplegia and hemiparesis following cerebral infarction affecting unspecified side E11.65  Type 2 diabetes mellitus with hyperglycemia Quantity: 1 Electronic Signature(s) Signed: 01/31/2021 12:16:40 PM By: Kalman Shan DO Signed: 02/04/2021 5:11:23 PM By: Levan Hurst RN, BSN Previous Signature: 01/31/2021 9:41:11 AM Version By: Kalman Shan DO Entered By: Levan Hurst on 01/31/2021 12:02:26

## 2021-02-05 ENCOUNTER — Encounter: Payer: Self-pay | Admitting: Nurse Practitioner

## 2021-02-05 ENCOUNTER — Ambulatory Visit: Payer: BC Managed Care – PPO | Attending: Nurse Practitioner | Admitting: Nurse Practitioner

## 2021-02-05 ENCOUNTER — Other Ambulatory Visit: Payer: Self-pay

## 2021-02-05 DIAGNOSIS — R42 Dizziness and giddiness: Secondary | ICD-10-CM

## 2021-02-05 MED ORDER — MECLIZINE HCL 12.5 MG PO TABS: 12.5000 mg | ORAL_TABLET | Freq: Three times a day (TID) | ORAL | 0 refills | Status: DC | PRN

## 2021-02-05 NOTE — Progress Notes (Signed)
Virtual Visit via Telephone Note Due to national recommendations of social distancing due to [place] 19, telehealth visit is felt to be most appropriate for this patient at this time.  I discussed the limitations, risks, security and privacy concerns of performing an evaluation and management service by telephone and the availability of in person appointments. I also discussed with the patient that there may be a patient responsible charge related to this service. The patient expressed understanding and agreed to proceed.    I connected with [name] on 02/05/21  at   8:30 AM EST  EDT by telephone and verified that I am speaking with the correct person using two identifiers.  Location of Patient: Private Residence   Location of Provider: [facility] [employer] Office    Persons participating in Telemedicine visit: Geryl Rankins FNP-BC [name]    History of Present Illness: Telemedicine visit for: Dizziness Pt with hx of HTN, DM, a.fib, rectal CA, esophageal ulcers on EGD done 11/2020, chronic Ca+ brain mass, sz, brain stem CVA 11/2016 with residual LT spastic hemiparesis and dysarthria, sacral ulcer, CKD 2.    Vertigo Patient presents for evaluation of dizziness. The symptoms started about a month ago and have gradually worsened. The attacks occur daily and last 10-15  minutes or so . Positions that worsen symptoms: any motion. Previous workup/treatments: blood work: UNREMARKABLE . Associated ear symptoms: none. Associated CNS symptoms: none. Recent infections: none. Head trauma: denied. Drug ingestion: none. Noise exposure: no occupational exposure. His cardizem was decreased to 3 times a day at his last visit with his PCP on 01-10-2021 due to his complaints of dizziness at that time. He is also taking carvedilol twice a day and amiodarone once a day which was decreased by his cardiologist.  Initially his dizziness did not affect his PT and OT however now he  states it is also affecting his therapy visits. Associated symptoms: None. Does endorse 1 episode of vomiting with dizziness that occurred 1.5 weeks ago. However he feels this was related to the spaghetti he was eating which was a little spicy. Denies falls or injury recently.    Past Medical History:  Diagnosis Date   Colon cancer ([place]) 2009   rectal   Diabetes mellitus without complication ([place])    Gait abnormality 03/31/2018   Hemiparesis and speech and language deficit as late effects of stroke ([place]) 03/31/2018   Hypertension    Seizure ([place])    Stroke [facility]) 2018    Past Surgical History:  Procedure Laterality Date   APPENDECTOMY     BIOPSY  11/18/2020   Procedure: BIOPSY;  Surgeon: Sharyn Creamer, MD;  Location: [facility] ENDOSCOPY;  Service: Gastroenterology;;   COLONOSCOPY N/A 11/18/2020   Procedure: COLONOSCOPY;  Surgeon: Sharyn Creamer, MD;  Location: [facility] ENDOSCOPY;  Service: Gastroenterology;  Laterality: N/A;   ESOPHAGOGASTRODUODENOSCOPY (EGD) WITH PROPOFOL N/A 11/18/2020   Procedure: ESOPHAGOGASTRODUODENOSCOPY (EGD) WITH PROPOFOL;  Surgeon: Sharyn Creamer, MD;  Location: [facility];  Service: Gastroenterology;  Laterality: N/A;   JOINT REPLACEMENT Left 2004   Left hip   POLYPECTOMY  11/18/2020   Procedure: POLYPECTOMY;  Surgeon: Sharyn Creamer, MD;  Location[facility] ENDOSCOPY;  Service: Gastroenterology;;   TOTAL HIP ARTHROPLASTY      Family History  Problem Relation Age of Onset   Hypertension Mother    Heart disease Mother    Hyperlipidemia Mother    Heart failure Mother    Hypertension Sister    Hypertension  Brother    Stroke Father    Hypertension Father    Colon cancer Maternal Grandmother    Diabetes Maternal Grandmother    Esophageal cancer Neg Hx     Social History   Socioeconomic History   Marital status: Married    Spouse name: [name]   Number of children: 1   Years of education: 16   Highest education level: Not on file  Occupational History    Occupation: disabled  Tobacco Use   Smoking status: Never   Smokeless tobacco: Never  Vaping Use   Vaping Use: Never used  Substance and Sexual Activity   Alcohol use: Not Currently   Drug use: No   Sexual activity: Not Currently  Other Topics Concern   Not on file  Social History Narrative   Patient drinks caffeine a couple times a week.   Patient is right handed.   Lives with wife [name]) and daughter   Social Determinants of Health   Financial Resource Strain: Not on file  Food Insecurity: Not on file  Transportation Needs: Not on file  Physical Activity: Not on file  Stress: Not on file  Social Connections: Not on file     Observations/Objective: Awake, alert and oriented x 3   Review of Systems  Constitutional:  Negative for fever, malaise/fatigue and weight loss.  HENT: Negative.  Negative for nosebleeds.   Eyes: Negative.  Negative for blurred vision, double vision and photophobia.  Respiratory: Negative.  Negative for cough and shortness of breath.   Cardiovascular: Negative.  Negative for chest pain, palpitations and leg swelling.  Gastrointestinal: Negative.  Negative for abdominal pain, blood in stool, constipation, diarrhea, heartburn, melena, nausea and vomiting.  Musculoskeletal: Negative.  Negative for myalgias.  Neurological:  Positive for dizziness. Negative for focal weakness, seizures and headaches.  Psychiatric/Behavioral: Negative.  Negative for suicidal ideas.    Assessment and Plan: Diagnoses and all orders for this visit:  Vertigo -     meclizine (ANTIVERT) 12.5 MG tablet; Take 1-2 tablets (12.5-25 mg total) by mouth 3 (three) times daily as needed for dizziness. May need to return to Neurology. Not sure if this is dizziness related to post CVA.     Follow Up Instructions Return for already has f/u appt with Dr. Wynetta Emery on 2-9, will need to follow up for dizziness/meclizine then.     I discussed the assessment and treatment plan with the  patient. The patient was provided an opportunity to ask questions and all were answered. The patient agreed with the plan and demonstrated an understanding of the instructions.   The patient was advised to call back or seek an in-person evaluation if the symptoms worsen or if the condition fails to improve as anticipated.  I provided 13 minutes of non-face-to-face time during this encounter including median intraservice time, reviewing previous notes, labs, imaging, medications and explaining diagnosis and management.  Gildardo Pounds, FNP-BC

## 2021-02-06 MED ORDER — FERROUS SULFATE 325 (65 FE) MG PO TABS: ORAL_TABLET | ORAL | 1 refills | Status: DC

## 2021-02-06 NOTE — Telephone Encounter (Signed)
Spoke to [place] PT with [facility].  Informed of message per Dr. Wynetta Emery. She states she has noticed and patient reports resting eye movement has progressed.  Informed of medication sig regarding Cardizem.   Will inform patient.

## 2021-02-06 NOTE — Addendum Note (Signed)
Addended by: Karle Plumber B on: 02/06/2021 12:29 PM   Modules accepted: Orders

## 2021-02-06 NOTE — Telephone Encounter (Signed)
Phone call placed to patient today for follow-up on dizziness and concern mentioned by the physical therapist.  Patient had telephone visit yesterday with the NP.  He was prescribed meclizine.  Patient states he took his first dose last night and he feels it was very helpful.  Physical therapist has not been out today to work with him as yet.  I told patient that since therapist is concerned and dizziness had persisted since I last saw him, I will order CT of head to make sure no new events.  Also recommend that he have the PT check his blood pressure the next time he/she is there to make sure he is not running low.  He tells me that they do check it and it was running good.  Patient also has a mild anemia.  Repeat CBC on last visit with me had improved.  Last iron studies suggest anemia of chronic disease plus or minus iron deficiency.  Ferritin level was elevated but that could be chronic inflammation.  I recommend taking an iron supplement 3 days a week.  Prescription will be sent to his pharmacy.

## 2021-02-07 ENCOUNTER — Other Ambulatory Visit (INDEPENDENT_AMBULATORY_CARE_PROVIDER_SITE_OTHER): Payer: BC Managed Care – PPO

## 2021-02-07 ENCOUNTER — Encounter: Payer: Self-pay | Admitting: Physician Assistant

## 2021-02-07 ENCOUNTER — Encounter (HOSPITAL_BASED_OUTPATIENT_CLINIC_OR_DEPARTMENT_OTHER): Payer: BC Managed Care – PPO | Admitting: Internal Medicine

## 2021-02-07 ENCOUNTER — Ambulatory Visit (INDEPENDENT_AMBULATORY_CARE_PROVIDER_SITE_OTHER): Payer: BC Managed Care – PPO | Admitting: Physician Assistant

## 2021-02-07 ENCOUNTER — Other Ambulatory Visit: Payer: Self-pay

## 2021-02-07 ENCOUNTER — Other Ambulatory Visit: Payer: Self-pay | Admitting: Internal Medicine

## 2021-02-07 VITALS — BP 100/74 | HR 84

## 2021-02-07 DIAGNOSIS — Z85048 Personal history of other malignant neoplasm of rectum, rectosigmoid junction, and anus: Secondary | ICD-10-CM

## 2021-02-07 DIAGNOSIS — L89153 Pressure ulcer of sacral region, stage 3: Secondary | ICD-10-CM

## 2021-02-07 DIAGNOSIS — Z8719 Personal history of other diseases of the digestive system: Secondary | ICD-10-CM

## 2021-02-07 DIAGNOSIS — E1165 Type 2 diabetes mellitus with hyperglycemia: Secondary | ICD-10-CM | POA: Diagnosis not present

## 2021-02-07 DIAGNOSIS — D509 Iron deficiency anemia, unspecified: Secondary | ICD-10-CM

## 2021-02-07 DIAGNOSIS — I69354 Hemiplegia and hemiparesis following cerebral infarction affecting left non-dominant side: Secondary | ICD-10-CM | POA: Diagnosis not present

## 2021-02-07 DIAGNOSIS — I480 Paroxysmal atrial fibrillation: Secondary | ICD-10-CM

## 2021-02-07 DIAGNOSIS — I69328 Other speech and language deficits following cerebral infarction: Secondary | ICD-10-CM | POA: Diagnosis not present

## 2021-02-07 DIAGNOSIS — I69359 Hemiplegia and hemiparesis following cerebral infarction affecting unspecified side: Secondary | ICD-10-CM

## 2021-02-07 DIAGNOSIS — Z86010 Personal history of colonic polyps: Secondary | ICD-10-CM | POA: Diagnosis not present

## 2021-02-07 DIAGNOSIS — G8220 Paraplegia, unspecified: Secondary | ICD-10-CM | POA: Diagnosis not present

## 2021-02-07 DIAGNOSIS — G40909 Epilepsy, unspecified, not intractable, without status epilepticus: Secondary | ICD-10-CM | POA: Diagnosis not present

## 2021-02-07 DIAGNOSIS — N182 Chronic kidney disease, stage 2 (mild): Secondary | ICD-10-CM | POA: Diagnosis not present

## 2021-02-07 DIAGNOSIS — E1122 Type 2 diabetes mellitus with diabetic chronic kidney disease: Secondary | ICD-10-CM | POA: Diagnosis not present

## 2021-02-07 DIAGNOSIS — E11622 Type 2 diabetes mellitus with other skin ulcer: Secondary | ICD-10-CM | POA: Diagnosis not present

## 2021-02-07 DIAGNOSIS — I129 Hypertensive chronic kidney disease with stage 1 through stage 4 chronic kidney disease, or unspecified chronic kidney disease: Secondary | ICD-10-CM | POA: Diagnosis not present

## 2021-02-07 DIAGNOSIS — I4891 Unspecified atrial fibrillation: Secondary | ICD-10-CM | POA: Diagnosis not present

## 2021-02-07 LAB — CBC WITH DIFFERENTIAL/PLATELET
Basophils Absolute: 0 10*3/uL (ref 0.0–0.1)
Basophils Relative: 0.5 % (ref 0.0–3.0)
Eosinophils Absolute: 0.1 10*3/uL (ref 0.0–0.7)
Eosinophils Relative: 1.8 % (ref 0.0–5.0)
HCT: 36 % — ABNORMAL LOW (ref 39.0–52.0)
Hemoglobin: 11.7 g/dL — ABNORMAL LOW (ref 13.0–17.0)
Lymphocytes Relative: 20.7 % (ref 12.0–46.0)
Lymphs Abs: 1 10*3/uL (ref 0.7–4.0)
MCHC: 32.4 g/dL (ref 30.0–36.0)
MCV: 77.5 fl — ABNORMAL LOW (ref 78.0–100.0)
Monocytes Absolute: 0.4 10*3/uL (ref 0.1–1.0)
Monocytes Relative: 7.3 % (ref 3.0–12.0)
Neutro Abs: 3.5 10*3/uL (ref 1.4–7.7)
Neutrophils Relative %: 69.7 % (ref 43.0–77.0)
Platelets: 292 10*3/uL (ref 150.0–400.0)
RBC: 4.64 Mil/uL (ref 4.22–5.81)
RDW: 18.6 % — ABNORMAL HIGH (ref 11.5–15.5)
WBC: 5.1 10*3/uL (ref 4.0–10.5)

## 2021-02-07 MED ORDER — PANTOPRAZOLE SODIUM 40 MG PO TBEC: 40.0000 mg | DELAYED_RELEASE_TABLET | Freq: Two times a day (BID) | ORAL | 11 refills | Status: DC

## 2021-02-07 NOTE — Progress Notes (Signed)
I agree with the above note, plan 

## 2021-02-07 NOTE — Progress Notes (Signed)
Mark, [name] ([id number]) Visit Report for 02/07/2021 Arrival Information Details Patient Name: Date of Service: [name] IN D. 02/07/2021 3:30 PM Medical Record Number: [id number] Patient Account Number: [id number] Date of Birth/Sex: Treating RN: 01-Mar-1965 (56 y.o. [name] Primary Care [name]: [name] Other Clinician: Referring [name]: Treating [name]/Extender: [name] in Treatment: 2 Visit Information History Since Last Visit Added or deleted any medications: No Patient Arrived: Wheel Chair Any new allergies or adverse reactions: No Arrival Time: 15:47 Had a fall or experienced change in No Accompanied By: wife activities of daily living that may affect Transfer Assistance: Manual risk of falls: Patient Identification Verified: Yes Signs or symptoms of abuse/neglect since last visito No Secondary Verification Process Completed: Yes Hospitalized since last visit: No Patient Requires Transmission-Based Precautions: No Implantable device outside of the clinic excluding No Patient Has Alerts: No cellular tissue based products placed in the center since last visit: Has Dressing in Place as Prescribed: Yes Pain Present Now: No Electronic Signature(s) Signed: 02/07/2021 5:00:57 PM By: [name] Entered By: [name] on 02/07/2021 15:47:57 -------------------------------------------------------------------------------- Encounter Discharge Information Details Patient Name: Date of Service: [name], [name] D. 02/07/2021 3:30 PM Medical Record Number: [id number] Patient Account Number: [id number] Date of Birth/Sex: Treating RN: 07-20-1965 (56 y.o. [name] Primary Care Tyleigh Mahn: [name] Other Clinician: Referring [name]: Treating [name]/Extender: [name] in Treatment: 2 Encounter Discharge Information Items Discharge Condition: Stable Ambulatory  Status: Wheelchair Discharge Destination: Home Transportation: Private Auto Accompanied By: wife Schedule Follow-up Appointment: Yes Clinical Summary of Care: Provided on 02/07/2021 Form Type Recipient Paper Patient Patient Electronic Signature(s) Signed: 02/07/2021 4:49:25 PM By: [name] Entered By: [name] on 02/07/2021 16:49:25 -------------------------------------------------------------------------------- Lower Extremity Assessment Details Patient Name: Date of Service: [name] IN D. 02/07/2021 3:30 PM Medical Record Number: [id number] Patient Account Number: [id number] Date of Birth/Sex: Treating RN: [birth year] (56 y.o. [name] Primary Care Pocahontas Cohenour: [name] Other Clinician: Referring [name]: Treating [name]/Extender: [name] in Treatment: 2 Electronic Signature(s) Signed: 02/07/2021 5:00:57 PM By: Fara Chute By: [name] on 02/07/2021 15:49:32 -------------------------------------------------------------------------------- Multi Wound Chart Details Patient Name: Date of Service: [name], [name] IN D. 02/07/2021 3:30 PM Medical Record Number: [id number] Patient Account Number: [id number] Date of Birth/Sex: Treating RN: [birth year] (56 y.o. [name] Primary Care [name]: [name] Other Clinician: Referring Yeng Frankie: Treating [name]/Extender: [name] in Treatment: 2 Vital Signs Height(in): 64 Pulse(bpm): 51 Weight(lbs): Blood Pressure(mmHg): 140/94 Body Mass Index(BMI): Temperature(F): 99.7 Respiratory Rate(breaths/min): 18 Photos: [N/A:N/A] Sacrum N/A N/A Wound Location: Pressure Injury N/A N/A Wounding Event: Pressure Ulcer N/A N/A Primary Etiology: Hypertension, Type II Diabetes, N/A N/A Comorbid History: Paraplegia, Seizure Disorder 11/13/2020 N/A N/A Date Acquired: 2 N/A N/A Weeks of Treatment: Open N/A  N/A Wound Status: No N/A N/A Wound Recurrence: 1.5x1x2.4 N/A N/A Measurements L x W x D (cm) 1.178 N/A N/A A (cm) : rea 2.827 N/A N/A Volume (cm) : 82.90% N/A N/A % Reduction in A rea: 74.30% N/A N/A % Reduction in Volume: Category/Stage III N/A N/A Classification: Medium N/A N/A Exudate A mount: Serosanguineous N/A N/A Exudate Type: red, brown N/A N/A Exudate Color: Distinct, outline attached N/A N/A Wound Margin: Large (67-100%) N/A N/A Granulation A mount: Red, Pink, Hyper-granulation, Friable N/A N/A Granulation Quality: None Present (0%) N/A N/A Necrotic A mount: Fat Layer (Subcutaneous Tissue): Yes N/A N/A Exposed Structures: Fascia: No Tendon: No  Muscle: No Joint: No Bone: No Small (1-33%) N/A N/A Epithelialization: Chemical Cauterization N/A N/A Procedures Performed: Treatment Notes Wound #1 (Sacrum) Cleanser Wound Cleanser Discharge Instruction: Cleanse the wound with wound cleanser prior to applying a clean dressing using gauze sponges, not tissue or cotton balls. Peri-Wound Care Skin Prep Discharge Instruction: Use skin prep as directed Topical Primary Dressing Hydrofera Blue Ready Foam, 4x5 in Discharge Instruction: Apply to wound bed as instructed Secondary Dressing Woven Gauze Sponge, Non-Sterile 4x4 in Discharge Instruction: Apply over primary dressing as directed. ABD Pad, 5x9 Discharge Instruction: Apply over primary dressing as directed. Secured With 4M Medipore H Soft Cloth Surgical T ape, 4 x 10 (in/yd) Discharge Instruction: Secure with tape as directed. Compression Wrap Compression Stockings Add-Ons Electronic Signature(s) Signed: 02/07/2021 4:54:45 PM By: Kalman Shan DO Signed: 02/07/2021 6:13:38 PM By: Mark Hurst RN, BSN Entered By: Kalman Shan on 02/07/2021 16:51:23 -------------------------------------------------------------------------------- [place] Details Patient Name: Date of  Service: [name] IN D. 02/07/2021 3:30 PM Medical Record Number: [id number] Patient Account Number: [id number] Date of Birth/Sex: Treating RN: 10-22-1965 (56 y.o. [name] Primary Care [name]: [name] Other Clinician: Referring Jawaan Adachi: Treating Yuriy Cui/Extender: [name] in Treatment: 2 Active Inactive Abuse / Safety / Falls / Self Care Management Nursing Diagnoses: Potential for falls Potential for injury related to falls Goals: Patient will remain injury free related to falls Date Initiated: 01/18/2021 Target Resolution Date: 03/07/2021 Goal Status: Active Patient/caregiver will verbalize/demonstrate measure taken to improve self care Date Initiated: 01/18/2021 Target Resolution Date: 03/07/2021 Goal Status: Active Interventions: Assess: immobility, friction, shearing, incontinence upon admission and as needed Assess self care needs on admission and as needed Notes: Orientation to the Wound Care Program Nursing Diagnoses: Knowledge deficit related to the wound healing center program Goals: Patient/caregiver will verbalize understanding of the [place] [facility] Date Initiated: 01/18/2021 Target Resolution Date: 03/07/2021 Goal Status: Active Interventions: Provide education on orientation to the wound center Notes: Pressure Nursing Diagnoses: Knowledge deficit related to causes and risk factors for pressure ulcer development Potential for impaired tissue integrity related to pressure, friction, moisture, and shear Goals: Patient will remain free from development of additional pressure ulcers Date Initiated: 01/18/2021 Target Resolution Date: 03/22/2021 Goal Status: Active Patient/caregiver will verbalize risk factors for pressure ulcer development Date Initiated: 01/18/2021 Target Resolution Date: 03/22/2021 Goal Status: Active Interventions: Assess: immobility, friction, shearing, incontinence upon  admission and as needed Assess offloading mechanisms upon admission and as needed Assess potential for pressure ulcer upon admission and as needed Provide education on pressure ulcers Treatment Activities: Patient referred for home evaluation of offloading devices/mattresses : 01/18/2021 Patient referred for pressure reduction/relief devices : 01/18/2021 Pressure reduction/relief device ordered : 01/18/2021 Notes: Wound/Skin Impairment Nursing Diagnoses: Knowledge deficit related to ulceration/compromised skin integrity Goals: Patient/caregiver will verbalize understanding of skin care regimen Date Initiated: 01/18/2021 Target Resolution Date: 03/07/2021 Goal Status: Active Interventions: Assess patient/caregiver ability to perform ulcer/skin care regimen upon admission and as needed Assess ulceration(s) every visit Provide education on ulcer and skin care Treatment Activities: Skin care regimen initiated : 01/18/2021 Topical wound management initiated : 01/18/2021 Notes: Electronic Signature(s) Signed: 02/07/2021 5:00:57 PM By: [name] Entered By: [name] on 02/07/2021 15:53:36 -------------------------------------------------------------------------------- Pain Assessment Details Patient Name: Date of Service: [name] IN D. 02/07/2021 3:30 PM Medical Record Number: [id number] Patient Account Number: [id number] Date of Birth/Sex: Treating RN: Jan 22, 1965 (56 y.o. [name] Primary Care Grayson White: [name] Other Clinician: Referring Shamecca Whitebread:  Treating Deanza Upperman/Extender: [name] in Treatment: 2 Active Problems Location of Pain Severity and Description of Pain Patient Has Paino No Site Locations Pain Management and Medication Current Pain Management: Electronic Signature(s) Signed: 02/07/2021 5:00:57 PM By: [name] Entered By: [name] on 02/07/2021  15:49:26 -------------------------------------------------------------------------------- Patient/Caregiver Education Details Patient Name: Date of Service: [name] IN D. 1/26/2023andnbsp3:30 PM Medical Record Number: [id number] Patient Account Number: [id number] Date of Birth/Gender: Treating RN: April 10, 1965 (56 y.o. [name] Primary Care Physician: [name] Other Clinician: Referring Physician: Treating Physician/Extender: [name] in Treatment: 2 Education Assessment Education Provided To: Patient and Caregiver Education Topics Provided Pressure: Methods: Explain/Verbal, Printed Responses: State content correctly Wound/Skin Impairment: Methods: Demonstration, Explain/Verbal, Printed Responses: State content correctly Electronic Signature(s) Signed: 02/07/2021 5:00:57 PM By: [name] Entered By: [name] on 02/07/2021 15:54:02 -------------------------------------------------------------------------------- Wound Assessment Details Patient Name: Date of Service: [name] IN D. 02/07/2021 3:30 PM Medical Record Number: [id number] Patient Account Number: [id number] Date of Birth/Sex: Treating RN: [birth year] (56 y.o. [name] Primary Care Zenaya Ulatowski: [name] Other Clinician: Referring Khamari Sheehan: Treating Ileana Chalupa/Extender: [name] in Treatment: 2 Wound Status Wound Number: 1 Primary Etiology: Pressure Ulcer Wound Location: Sacrum Wound Status: Open Wounding Event: Pressure Injury Comorbid Hypertension, Type II Diabetes, Paraplegia, Seizure History: Disorder Date Acquired: 11/13/2020 Weeks Of Treatment: 2 Clustered Wound: No Photos Wound Measurements Length: (cm) 1.5 Width: (cm) 1 Depth: (cm) 2.4 Area: (cm) 1.178 Volume: (cm) 2.827 % Reduction in Area: 82.9% % Reduction in Volume: 74.3% Epithelialization: Small (1-33%) Tunneling:  No Undermining: No Wound Description Classification: Category/Stage III Wound Margin: Distinct, outline attached Exudate Amount: Medium Exudate Type: Serosanguineous Exudate Color: red, brown Foul Odor After Cleansing: No Slough/Fibrino No Wound Bed Granulation Amount: Large (67-100%) Exposed Structure Granulation Quality: Red, Pink, Hyper-granulation, Friable Fascia Exposed: No Necrotic Amount: None Present (0%) Fat Layer (Subcutaneous Tissue) Exposed: Yes Tendon Exposed: No Muscle Exposed: No Joint Exposed: No Bone Exposed: No Treatment Notes Wound #1 (Sacrum) Cleanser Wound Cleanser Discharge Instruction: Cleanse the wound with wound cleanser prior to applying a clean dressing using gauze sponges, not tissue or cotton balls. Peri-Wound Care Skin Prep Discharge Instruction: Use skin prep as directed Topical Primary Dressing Hydrofera Blue Ready Foam, 4x5 in Discharge Instruction: Apply to wound bed as instructed Secondary Dressing Woven Gauze Sponge, Non-Sterile 4x4 in Discharge Instruction: Apply over primary dressing as directed. ABD Pad, 5x9 Discharge Instruction: Apply over primary dressing as directed. Secured With 1M Medipore H Soft Cloth Surgical T ape, 4 x 10 (in/yd) Discharge Instruction: Secure with tape as directed. Compression Wrap Compression Stockings Add-Ons Electronic Signature(s) Signed: 02/07/2021 5:00:57 PM By: [name] Entered By: [name] on 02/07/2021 15:55:45 -------------------------------------------------------------------------------- Vitals Details Patient Name: Date of Service: [name], [place] D. 02/07/2021 3:30 PM Medical Record Number: [id number] Patient Account Number: [id number] Date of Birth/Sex: Treating RN: 29-Apr-1965 (56 y.o. [name] Primary Care Marcille Barman: [name] Other Clinician: Referring Samella Lucchetti: Treating Jasmia Angst/Extender: [name] in Treatment: 2 Vital  Signs Time Taken: 15:48 Temperature (F): 99.7 Height (in): 64 Pulse (bpm): 87 Respiratory Rate (breaths/min): 18 Blood Pressure (mmHg): 140/94 Reference Range: 80 - 120 mg / dl Electronic Signature(s) Signed: 02/07/2021 5:00:57 PM By: [name] Entered By: [name] on 02/07/2021 15:49:21

## 2021-02-07 NOTE — Progress Notes (Signed)
Mark, [name] ([id number]) Visit Report for 1/26/Mark Chief Complaint Document Details Patient Name: Date of Service: [name] IN D. 1/26/Mark 3:30 PM Medical Record Number: [id number] Patient Account Number: [id number] Date of Birth/Sex: Treating RN: Dec 22, 1965 (56 y.o. [name] Primary Care Provider: Karle Plumber Other Clinician: Referring Provider: Treating Provider/Extender: Sammuel Bailiff in Treatment: 2 Information Obtained from: Patient Chief Complaint Sacral ulcer Electronic Signature(s) Signed: 1/26/Mark 4:54:45 PM By: Kalman Shan DO Entered By: Kalman Shan on 01/26/Mark 16:51:29 -------------------------------------------------------------------------------- HPI Details Patient Name: Date of Service: [name], Mark D. 1/26/Mark 3:30 PM Medical Record Number: [id number] Patient Account Number: [id number] Date of Birth/Sex: Treating RN: [name] (56 y.o. [name] Primary Care Provider: Karle Plumber Other Clinician: Referring Provider: Treating Provider/Extender: Sammuel Bailiff in Treatment: 2 History of Present Illness HPI Description: Admission 1/6/Mark [name] is a 56 year old male with a past medical history of CVA with left-sided hemiparesis, language and speech deficits, hypertension, and seizure disorder that presents the clinic for a 4-month history of sacral ulcer. He was hospitalized on 11/23/2020 for new onset A. fib and GI bleed. He states that during this hospitalization he developed a pressure ulcer to his sacrum. He reports using Santyl to the wound bed twice daily. He has a group 2 air mattress and states he has been offloading the area by staying on his side. He currently denies signs of infection. He reports improvement in wound healing over the past month. 1/19; patient presents for follow-up. His pharmacy has ordered Dakin's but is not  available yet to the patient. He has continued with Santyl daily. He has no issues or complaints today. He denies signs of infection. 1/26; patient presents for follow-up. He has been using Hydrofera Blue to the wound beds. He has no issues or complaints today. He denies signs of infection. Electronic Signature(s) Signed: 1/26/Mark 4:54:45 PM By: Kalman Shan DO Entered By: Kalman Shan on 01/26/Mark 16:51:45 -------------------------------------------------------------------------------- Chemical Cauterization Details Patient Name: Date of Service: [name] IN D. 1/26/Mark 3:30 PM Medical Record Number: [id number] Patient Account Number: [id number] Date of Birth/Sex: Treating RN: 08/04/1965 (56 y.o. [name] Primary Care Provider: Karle Plumber Other Clinician: Referring Provider: Treating Provider/Extender: Sammuel Bailiff in Treatment: 2 Procedure Performed for: Wound #1 Sacrum Performed By: Physician Kalman Shan, DO Post Procedure Diagnosis Same as Pre-procedure Electronic Signature(s) Signed: 1/26/Mark 4:54:45 PM By: Kalman Shan DO Signed: 1/26/Mark 5:00:57 PM By: Lorrin Jackson Entered By: Lorrin Jackson on 01/26/Mark 16:03:37 -------------------------------------------------------------------------------- Physical Exam Details Patient Name: Date of Service: [name] IN D. 1/26/Mark 3:30 PM Medical Record Number: [id number] Patient Account Number: [id number] Date of Birth/Sex: Treating RN: [birth year] (56 y.o. [name] Primary Care Provider: Karle Plumber Other Clinician: Referring Provider: Treating Provider/Extender: Sammuel Bailiff in Treatment: 2 Constitutional respirations regular, non-labored and within target range for patient.Marland Kitchen Psychiatric pleasant and cooperative. Notes Sacral ulcer with hyper granulated tissue throughout. Tunneling at the 12 o'clock position.  No signs of infection. Electronic Signature(s) Signed: 1/26/Mark 4:54:45 PM By: Kalman Shan DO Entered By: Kalman Shan on 01/26/Mark 16:52:12 -------------------------------------------------------------------------------- Physician Orders Details Patient Name: Date of Service: [name] IN D. 1/26/Mark 3:30 PM Medical Record Number: [id number] Patient Account Number: [id number] Date of Birth/Sex: Treating RN: Sep 23, 1965 (56 y.o. [name] Primary Care Provider: Karle Plumber Other Clinician: Referring Provider: Treating Provider/Extender: Sammuel Bailiff in Treatment: 2 Verbal / Phone  Orders: No Diagnosis Coding Follow-up Appointments ppointment in 1 week. - Dr. Heber Sikes Return A Off-Loading Wound #1 Sacrum Low air-loss mattress (Group 2) - continue to use. Turn and reposition every 2 hours Other: - continue to use specialty mattress. minimize in wheelchair to help aid in healing pressure wound. no pressure to sacral area to prevent worsening. Home Health Wound #1 Sacrum No change in wound care orders this week; continue Home Health for wound care. May utilize formulary equivalent dressing for wound treatment orders unless otherwise specified. Other Home Health Orders/Instructions: - [facility] Wound Treatment Wound #1 - Sacrum Cleanser: Wound Cleanser ([place]) 1 x Per Day/30 Days Discharge Instructions: Cleanse the wound with wound cleanser prior to applying a clean dressing using gauze sponges, not tissue or cotton balls. Peri-Wound Care: Skin Prep (Home Health) 1 x Per Day/30 Days Discharge Instructions: Use skin prep as directed Prim Dressing: Hydrofera Blue Ready Foam, 4x5 in (Home Health) 1 x Per Day/30 Days ary Discharge Instructions: Apply to wound bed as instructed Secondary Dressing: Woven Gauze Sponge, Non-Sterile 4x4 in (Home Health) 1 x Per Day/30 Days Discharge Instructions: Apply over primary dressing as  directed. Secondary Dressing: ABD Pad, 5x9 (Home Health) 1 x Per Day/30 Days Discharge Instructions: Apply over primary dressing as directed. Secured With: 77M Medipore H Soft Cloth Surgical T ape, 4 x 10 (in/yd) (Home Health) 1 x Per Day/30 Days Discharge Instructions: Secure with tape as directed. Radiology X-ray, other - Xray of sacrum, chronic wound. R/O Osteomyelitis Electronic Signature(s) Signed: 1/26/Mark 4:54:45 PM By: Kalman Shan DO Entered By: Kalman Shan on 01/26/Mark 16:52:27 Prescription 1/26/Mark -------------------------------------------------------------------------------- [name] D. Kalman Shan DO Patient Name: Provider: [birth year] [id number] Date of Birth: NPI#: Jerilynn Mages [id number] Sex: DEA #: [phone] Phone #: License #: [place] Patient Address: [address] [place], [place] [address] [place], [place] [address] [phone] Allergies No Known Allergies Provider's Orders X-ray, other - Xray of sacrum, chronic wound. R/O Osteomyelitis Hand Signature: Date(s): Electronic Signature(s) Signed: 1/26/Mark 4:54:45 PM By: Kalman Shan DO Entered By: Kalman Shan on 01/26/Mark 16:52:28 -------------------------------------------------------------------------------- Problem List Details Patient Name: Date of Service: [name], [name] IN D. 1/26/Mark 3:30 PM Medical Record Number: [id number] Patient Account Number: [id number] Date of Birth/Sex: Treating RN: 1965-04-23 (56 y.o. [name] Primary Care Provider: Karle Plumber Other Clinician: Referring Provider: Treating Provider/Extender: Sammuel Bailiff in Treatment: 2 Active Problems ICD-10 Encounter Code Description Active Date MDM Diagnosis L89.153 Pressure ulcer of sacral region, stage 3 1/6/Mark No Yes I69.359 Hemiplegia and hemiparesis following cerebral  infarction affecting unspecified 1/6/Mark No Yes side E11.65 Type 2 diabetes mellitus with hyperglycemia 1/6/Mark No Yes Inactive Problems Resolved Problems Electronic Signature(s) Signed: 1/26/Mark 4:54:45 PM By: Kalman Shan DO Entered By: Kalman Shan on 01/26/Mark 16:51:18 -------------------------------------------------------------------------------- Progress Note Details Patient Name: Date of Service: [name] IN D. 1/26/Mark 3:30 PM Medical Record Number: [id number] Patient Account Number: [id number] Date of Birth/Sex: Treating RN: 09-22-1965 (56 y.o. [name] Primary Care Provider: Karle Plumber Other Clinician: Referring Provider: Treating Provider/Extender: Sammuel Bailiff in Treatment: 2 Subjective Chief Complaint Information obtained from Patient Sacral ulcer History of Present Illness (HPI) Admission 1/6/Mark Mr. Jaydeen Odor is a 56 year old male with a past medical history of CVA with left-sided hemiparesis, language and speech deficits, hypertension, and seizure disorder that presents the clinic for a 46-month history of sacral ulcer. He was hospitalized  on 11/23/2020 for new onset A. fib and GI bleed. He states that during this hospitalization he developed a pressure ulcer to his sacrum. He reports using Santyl to the wound bed twice daily. He has a group 2 air mattress and states he has been offloading the area by staying on his side. He currently denies signs of infection. He reports improvement in wound healing over the past month. 1/19; patient presents for follow-up. His pharmacy has ordered Dakin's but is not available yet to the patient. He has continued with Santyl daily. He has no issues or complaints today. He denies signs of infection. 1/26; patient presents for follow-up. He has been using Hydrofera Blue to the wound beds. He has no issues or complaints today. He denies signs of infection. Patient  History Information obtained from Patient, Chart. Family History Unknown History. Social History Never smoker, Marital Status - Single, Alcohol Use - Never, Drug Use - Prior History, Caffeine Use - Rarely. Medical History Eyes Denies history of Cataracts, Glaucoma, Optic Neuritis Ear/Nose/Mouth/Throat Denies history of Chronic sinus problems/congestion, Middle ear problems Respiratory Denies history of Aspiration, Asthma, Chronic Obstructive Pulmonary Disease (COPD), Pneumothorax, Sleep Apnea, Tuberculosis Cardiovascular Patient has history of Hypertension Gastrointestinal Denies history of Cirrhosis , Colitis, Crohnoos, Hepatitis A, Hepatitis B, Hepatitis C Endocrine Patient has history of Type II Diabetes Denies history of Type I Diabetes Musculoskeletal Denies history of Gout, Rheumatoid Arthritis, Osteoarthritis, Osteomyelitis Neurologic Patient has history of Paraplegia, Seizure Disorder Denies history of Dementia, Neuropathy, Quadriplegia Medical A Surgical History Notes nd Genitourinary CKD stage 2 Objective Constitutional respirations regular, non-labored and within target range for patient.. Vitals Time Taken: 3:48 PM, Height: 64 in, Temperature: 99.7 F, Pulse: 87 bpm, Respiratory Rate: 18 breaths/min, Blood Pressure: 140/94 mmHg. Psychiatric pleasant and cooperative. General Notes: Sacral ulcer with hyper granulated tissue throughout. Tunneling at the 12 o'clock position. No signs of infection. Integumentary (Hair, Skin) Wound #1 status is Open. Original cause of wound was Pressure Injury. The date acquired was: 11/13/2020. The wound has been in treatment 2 weeks. The wound is located on the Sacrum. The wound measures 1.5cm length x 1cm width x 2.4cm depth; 1.178cm^2 area and 2.827cm^3 volume. There is Fat Layer (Subcutaneous Tissue) exposed. There is no tunneling or undermining noted. There is a medium amount of serosanguineous drainage noted. The wound margin is  distinct with the outline attached to the wound base. There is large (67-100%) red, pink, friable, hyper - granulation within the wound bed. There is no necrotic tissue within the wound bed. Assessment Active Problems ICD-10 Pressure ulcer of sacral region, stage 3 Hemiplegia and hemiparesis following cerebral infarction affecting unspecified side Type 2 diabetes mellitus with hyperglycemia Patient's wound is stable. I used silver nitrate to the hyper granulated areas. There is still tunneling and I am concerned I can probe to bone. I will obtain an x- ray. No signs of surrounding infection. I recommended aggressive offloading Continuing with Hydrofera Blue. Follow-up in 1 week. Procedures Wound #1 Pre-procedure diagnosis of Wound #1 is a Pressure Ulcer located on the Sacrum . An Chemical Cauterization procedure was performed by Kalman Shan, DO. Post procedure Diagnosis Wound #1: Same as Pre-Procedure Plan Follow-up Appointments: Return Appointment in 1 week. - Dr. Heber [place] Off-Loading: Wound #1 Sacrum: Low air-loss mattress (Group 2) - continue to use. Turn and reposition every 2 hours Other: - continue to use specialty mattress. minimize in wheelchair to help aid in healing pressure wound. no pressure to sacral area to prevent worsening.  Home Health: Wound #1 Sacrum: No change in wound care orders this week; continue Home Health for wound care. May utilize formulary equivalent dressing for wound treatment orders unless otherwise specified. Other Home Health Orders/Instructions: [facility] Radiology ordered were: X-ray, other - Xray of sacrum, chronic wound. R/O Osteomyelitis WOUND #1: - Sacrum Wound Laterality: Cleanser: Wound Cleanser (Home Health) 1 x Per Day/30 Days Discharge Instructions: Cleanse the wound with wound cleanser prior to applying a clean dressing using gauze sponges, not tissue or cotton balls. Peri-Wound Care: Skin Prep (Home Health) 1 x Per Day/30  Days Discharge Instructions: Use skin prep as directed Prim Dressing: Hydrofera Blue Ready Foam, 4x5 in (Home Health) 1 x Per Day/30 Days ary Discharge Instructions: Apply to wound bed as instructed Secondary Dressing: Woven Gauze Sponge, Non-Sterile 4x4 in (Home Health) 1 x Per Day/30 Days Discharge Instructions: Apply over primary dressing as directed. Secondary Dressing: ABD Pad, 5x9 (Home Health) 1 x Per Day/30 Days Discharge Instructions: Apply over primary dressing as directed. Secured With: 12M Medipore H Soft Cloth Surgical T ape, 4 x 10 (in/yd) (Home Health) 1 x Per Day/30 Days Discharge Instructions: Secure with tape as directed. 1. Hydrofera Blue 2. Aggressive offloading 3. Follow-up in 1 week 4. X-ray of the sacrum Electronic Signature(s) Signed: 1/26/Mark 4:54:45 PM By: Kalman Shan DO Entered By: Kalman Shan on 01/26/Mark 16:53:53 -------------------------------------------------------------------------------- HxROS Details Patient Name: Date of Service: [name], [place] D. 1/26/Mark 3:30 PM Medical Record Number: [id number] Patient Account Number: [id number] Date of Birth/Sex: Treating RN: [birth year] (56 y.o. [name] Primary Care Provider: Karle Plumber Other Clinician: Referring Provider: Treating Provider/Extender: Sammuel Bailiff in Treatment: 2 Information Obtained From Patient Chart Eyes Medical History: Negative for: Cataracts; Glaucoma; Optic Neuritis Ear/Nose/Mouth/Throat Medical History: Negative for: Chronic sinus problems/congestion; Middle ear problems Respiratory Medical History: Negative for: Aspiration; Asthma; Chronic Obstructive Pulmonary Disease (COPD); Pneumothorax; Sleep Apnea; Tuberculosis Cardiovascular Medical History: Positive for: Hypertension Gastrointestinal Medical History: Negative for: Cirrhosis ; Colitis; Crohns; Hepatitis A; Hepatitis B; Hepatitis C Endocrine Medical  History: Positive for: Type II Diabetes Negative for: Type I Diabetes Genitourinary Medical History: Past Medical History Notes: CKD stage 2 Musculoskeletal Medical History: Negative for: Gout; Rheumatoid Arthritis; Osteoarthritis; Osteomyelitis Neurologic Medical History: Positive for: Paraplegia; Seizure Disorder Negative for: Dementia; Neuropathy; Quadriplegia Immunizations Pneumococcal Vaccine: Received Pneumococcal Vaccination: Yes Received Pneumococcal Vaccination On or After 60th Birthday: Yes Implantable Devices None Family and Social History Unknown History: Yes; Never smoker; Marital Status - Single; Alcohol Use: Never; Drug Use: Prior History; Caffeine Use: Rarely; Financial Concerns: No; Food, Clothing or Shelter Needs: No; Support System Lacking: No; Transportation Concerns: No Electronic Signature(s) Signed: 1/26/Mark 4:54:45 PM By: Kalman Shan DO Signed: 1/26/Mark 6:13:38 PM By: Levan Hurst RN, BSN Entered By: Kalman Shan on 01/26/Mark 16:51:52 -------------------------------------------------------------------------------- [place] Details Patient Name: Date of Service: [name], [place] D. 1/26/Mark Medical Record Number: [id number] Patient Account Number: [id number] Date of Birth/Sex: Treating RN: 16-Aug-1965 (56 y.o. [name] Primary Care Provider: Karle Plumber Other Clinician: Referring Provider: Treating Provider/Extender: Sammuel Bailiff in Treatment: 2 Diagnosis Coding ICD-10 Codes Code Description [phone] Pressure ulcer of sacral region, stage 3 I69.359 Hemiplegia and hemiparesis following cerebral infarction affecting unspecified side E11.65 Type 2 diabetes mellitus with hyperglycemia Facility Procedures CPT4 Code: [id number] Description: 29798 - CHEM CAUT GRANULATION TISS ICD-10 Diagnosis Description L89.153 Pressure ulcer of sacral region, stage 3 Modifier: Quantity: 1 Physician  Procedures : CPT4 Code Description Modifier  [id number] - WC PHYS LEVEL 3 - EST PT ICD-10 Diagnosis Description L89.153 Pressure ulcer of sacral region, stage 3 I69.359 Hemiplegia and hemiparesis following cerebral infarction affecting unspecified side E11.65  Type 2 diabetes mellitus with hyperglycemia Quantity: 1 : [id number] 17250 - WC PHYS CHEM CAUT GRAN TISSUE ICD-10 Diagnosis Description L89.153 Pressure ulcer of sacral region, stage 3 Quantity: 1 Electronic Signature(s) Signed: 1/26/Mark 4:54:45 PM By: Kalman Shan DO Previous Signature: 1/26/Mark 4:48:19 PM Version By: Lorrin Jackson Entered By: Kalman Shan on 01/26/Mark 16:54:06

## 2021-02-07 NOTE — Telephone Encounter (Signed)
Requested medications are due for refill today.  yes  Requested medications are on the active medications list.  yes  Last refill. 01/16/2021  Future visit scheduled.   yes  Notes to clinic.  Medication not delegated.    Requested Prescriptions  Pending Prescriptions Disp Refills   amiodarone (PACERONE) 200 MG tablet [Pharmacy Med Name: AMIODARONE HCL 200 MG TABLET] 30 tablet 0    Sig: TAKE 1 TABLET BY MOUTH EVERY DAY     Not Delegated - Cardiovascular: Antiarrhythmic Agents - amiodarone Failed - 02/07/2021  1:31 PM      Failed - This refill cannot be delegated      Passed - TSH in normal range and within 180 days    TSH  Date Value Ref Range Status  11/14/2020 1.076 0.350 - 4.500 uIU/mL Final    Comment:    Performed by a 3rd Generation assay with a functional sensitivity of <=0.01 uIU/mL. Performed at [facility], [place] [address] [place], [place] [address]           Passed - Mg Level in normal range and within 180 days    Magnesium  Date Value Ref Range Status  11/17/2020 2.4 1.7 - 2.4 mg/dL Final    Comment:    Performed at [facility], [place] [address], [place], [place] [address]          Passed - K in normal range and within 180 days    Potassium  Date Value Ref Range Status  01/09/2021 4.7 3.5 - 5.2 mmol/L Final          Passed - Ca in normal range and within 180 days    Calcium  Date Value Ref Range Status  01/09/2021 9.3 8.7 - 10.2 mg/dL Final          Passed - AST in normal range and within 180 days    AST  Date Value Ref Range Status  01/09/2021 21 0 - 40 IU/L Final          Passed - ALT in normal range and within 180 days    ALT  Date Value Ref Range Status  01/09/2021 27 0 - 44 IU/L Final          Passed - Patient had ECG in the last 180 days      Passed - Last BP in normal range    BP Readings from Last 1 Encounters:  02/07/21 100/74          Passed - Last Heart Rate in normal range    Pulse Readings from Last 1  Encounters:  02/07/21 84          Passed - Valid encounter within last 6 months    Recent Outpatient Visits           2 days ago Robertson, Vernia Buff, NP   4 weeks ago [place], Deborah B, MD   1 month ago Hospital discharge follow-up   [place] Ladell Pier, MD   3 months ago Pain of right upper extremity   [place], MD   3 months ago Controlled type 2 diabetes mellitus with other circulatory complication, without long-term current use of insulin [facility])   [facility] Ladell Pier, MD  Future Appointments             In 2 weeks Ladell Pier, MD [place]

## 2021-02-07 NOTE — Patient Instructions (Addendum)
If you are age 55 or younger, your body mass index should be between 19-25. Your There is no height or weight on file to calculate BMI. If this is out of the aformentioned range listed, please consider follow up with your Primary Care Provider.  ________________________________________________________  The [facility] providers would like to encourage you to use [facility] to communicate with providers for non-urgent requests or questions.  Due to long hold times on the telephone, sending your provider a message by [facility] may be a faster and more efficient way to get a response.  Please allow 48 business hours for a response.  Please remember that this is for non-urgent requests.  _______________________________________________________  Your provider has requested that you go to the basement level for lab work before leaving today. Press "B" on the elevator. The lab is located at the first door on the left as you exit the elevator.  We have sent refills of Pantoprazole to your pharmacy.  Continue Iron every other day.  Call the office in the fall to schedule a 1 year follow up with Dr. Ardis Hughs or sooner if needed.  Thank you for entrusting me with your care and choosing [facility].  Nicoletta Ba, PA_C

## 2021-02-07 NOTE — Progress Notes (Signed)
Subjective:    Patient ID: [name], male    DOB: 1965/07/17, 56 y.o.    MRN: [id number]  HPI [name] is a 56 year old African-American male, known to Dr. Ardis Hughs who comes in today for post hospital follow-up after he was hospitalized in November 2022.  He had originally presented to the emergency room with left-sided pain. He does have multiple comorbidities including hypertension, chronic diastolic heart failure, history of CVA with residual left-sided weakness, adult onset diabetes mellitus, seizure disorder and prior history of rectal cancer for which he underwent resection, radiation and chemotherapy.  He had fallen at home trying to transition from his wheelchair.  In the ER he was found to be in atrial fibrillation with RVR.  CTA of the chest abdomen pelvis were negative for any acute abnormalities.  He was noted to have anemia, microcytic, heme-negative but was seen by our service and ultimately underwent EGD and colonoscopy for iron deficiency anemia.  These were done by Dr. Lorenso Courier with EGD showing multiple linear ulcers in the mid esophagus and a single papule in the gastric body was biopsied.  Biopsies from the stomach showed chronic gastritis no H. pylori, esophageal biopsy showed squamous mucosa with mild inflammation no intestinal metaplasia or dysplasia, at colonoscopy he had multiple diverticuli throughout the colon and multiple shallow ulcers were noted in the cecum hepatic flexure and ascending colon, there was also a 4 mm polyp in the rectum and nonbleeding internal hemorrhoids. Biopsies from the colon showed no evidence of active colitis, there was colonic mucosa with healed surface ulcer and the rectal polyp was a tubular adenoma.  It was felt that the mild colitis may have been ischemic in etiology. Hemoglobin was in the 9.5 range during hospitalization, ferritin 388/serum iron 22/TIBC 211/iron sat 10. Repeat labs on 01/09/2021 hemoglobin 11.3/hematocrit 34.2 and MCV of  76 He says he has been feeling well over the past month, has no current GI complaints.  He is maintained on twice daily Protonix and has no complaints of heartburn or indigestion or dysphagia.  He has just been started on an oral iron supplement which will be given Monday Wednesday and Friday.  He has no complaints of abdominal pain, bowel movements have been normal, no melena or hematochezia.  Initial diagnosis of rectal cancer was made in 2009 Review of Systems Pertinent positive and negative review of systems were noted in the above HPI section.  All other review of systems was otherwise negative.   Outpatient Encounter Medications as of 02/07/2021  Medication Sig   amiodarone (PACERONE) 200 MG tablet Take 1 tablet (200 mg total) by mouth daily.   atorvastatin (LIPITOR) 80 MG tablet Take 1 tablet (80 mg total) by mouth daily.   carvedilol (COREG) 12.5 MG tablet Take 1 tablet (12.5 mg total) by mouth 2 (two) times daily with a meal.   citalopram (CELEXA) 20 MG tablet TAKE 1 TABLET BY MOUTH EVERY DAY   collagenase (SANTYL) ointment Apply topically 2 (two) times daily. To yellow slough and cover with damp to dry dressing.  Change dressing twice a day   diltiazem (CARDIZEM) 30 MG tablet Take 1 tablet (30 mg total) by mouth every 8 (eight) hours.   ferrous sulfate 325 (65 FE) MG tablet 1 tab PO Q Mon/Wed/Fri   meclizine (ANTIVERT) 12.5 MG tablet Take 1-2 tablets (12.5-25 mg total) by mouth 3 (three) times daily as needed for dizziness.   morphine (MS CONTIN) 15 MG 12 hr tablet Take 1 tablet (15  mg total) by mouth every 12 (twelve) hours.   oxybutynin (DITROPAN) 5 MG tablet TAKE 1 TABLET BY MOUTH TWICE A DAY   oxyCODONE (OXY IR/ROXICODONE) 5 MG immediate release tablet Take 1-2 tablets (5-10 mg total) by mouth every 6 (six) hours as needed for severe pain.   traZODone (DESYREL) 150 MG tablet Take 1/2 tablet (75 mg total) by mouth at bedtime.   [DISCONTINUED] pantoprazole (PROTONIX) 40 MG tablet Take  1 tablet (40 mg total) by mouth 2 (two) times daily.   pantoprazole (PROTONIX) 40 MG tablet Take 1 tablet (40 mg total) by mouth 2 (two) times daily.   No facility-administered encounter medications on file as of 02/07/2021.   No Known Allergies Patient Active Problem List   Diagnosis Date Noted   Chronic pain syndrome 01/02/2021   Spasticity as late effect of cerebrovascular accident (CVA) 01/02/2021   Esophageal ulcer without bleeding 12/25/2020   Sacral decubitus ulcer, stage IV ([place]) 12/25/2020   Adenomatous polyp of colon 12/25/2020   CKD (chronic kidney disease) stage 2, GFR 60-89 ml/min 12/25/2020   Mild major depression ([place]) 12/25/2020   Sacral decubitus ulcer 12/17/2020   Hyponatremia 12/17/2020   Acute gastric ulcer    Controlled type 2 diabetes mellitus with hyperglycemia, without long-term current use of insulin ([place])    New onset atrial fibrillation ([place])    Pressure injury of skin 11/26/2020   Debility 11/23/2020   History of colonic polyps    Nausea and vomiting    AKI (acute kidney injury) ([place]) 11/14/2020   Atrial fibrillation with RVR ([place]) 11/14/2020   History of CVA with residual deficit 11/14/2020   Gout 11/14/2020   Microcytic hypochromic anemia 11/14/2020   History of meningioma 11/14/2020   Obesity (BMI 30.0-34.9) 11/14/2020   Pain due to onychomycosis of toenails of both feet 09/21/2020   Edema of both legs 09/29/2019   Meningioma ([place]) 04/13/2019   Hemiparesis and speech and language deficit as late effects of stroke ([place]) 03/31/2018   Gait abnormality 03/31/2018   Spastic hemiplegia affecting left nondominant side ([place]) 12/18/2017   Subacromial impingement of left shoulder 06/09/2017   Spastic neurogenic bladder 06/09/2017   Dysarthria as late effect of stroke 04/01/2017   Neurogenic bladder due to old stroke 12/25/2016   Right pontine cerebrovascular accident ([place]) 12/18/2016   Left hemiparesis ([place])    Diastolic dysfunction    Dysphagia,  post-stroke    Acute ischemic stroke ([place]) 12/12/2016   Seizures ([place]) 07/22/2014   Hypertension 07/22/2014   Type 2 diabetes mellitus with vascular disease ([place]) 07/22/2014   Brain mass    Malignant neoplasm of rectum ([place]) 09/02/2007   Social History   Socioeconomic History   Marital status: Married    Spouse name: [name]   Number of children: 1   Years of education: 16   Highest education level: Not on file  Occupational History   Occupation: disabled  Tobacco Use   Smoking status: Never   Smokeless tobacco: Never  Vaping Use   Vaping Use: Never used  Substance and Sexual Activity   Alcohol use: Not Currently   Drug use: No   Sexual activity: Not Currently  Other Topics Concern   Not on file  Social History Narrative   Patient drinks caffeine a couple times a week.   Patient is right handed.   Lives with wife [name]) and daughter   Social Determinants of Health   Financial Resource Strain: Not on file  Food Insecurity: Not on  file  Transportation Needs: Not on file  Physical Activity: Not on file  Stress: Not on file  Social Connections: Not on file  Intimate Partner Violence: Not on file    Mr. Besser's family history includes Colon cancer in his maternal grandmother; Diabetes in his maternal grandmother; Heart disease in his mother; Heart failure in his mother; Hyperlipidemia in his mother; Hypertension in his brother, father, mother, and sister; Stroke in his father.      Objective:    Vitals:   02/07/21 0902  BP: 100/74  Pulse: 84    Physical Exam Well-developed African-American male, in a wheelchair, accompanied by family member, in no acute distress.   Weight, 249   HEENT; nontraumatic normocephalic, EOMI, PE R LA, sclera anicteric. Oropharynx; not examined today Neck; supple, no JVD Cardiovascular; regular rate and rhythm with S1-S2, no murmur rub or gallop Pulmonary; Clear bilaterally Abdomen; soft, nontender, nondistended, no palpable  mass or hepatosplenomegaly, bowel sounds are active Rectal; not done today Skin; benign exam, no jaundice rash or appreciable lesions Extremities; no clubbing cyanosis or edema skin warm and dry Neuro/Psych; alert and oriented x4, grossly nonfocal mood and affect appropriate        Assessment & Plan:   #61  56 year old African-American male here for post hospital follow-up from November 2022 at which time he underwent EGD and colonoscopy while hospitalized for further evaluation of iron deficiency anemia. He had acute esophagitis on EGD as outlined above and a colonoscopy with pandiverticulosis and multiple shallow ulcers from the cecum to the ascending colon and hepatic flexure felt likely to be ischemic in etiology.  Biopsies were benign showing healed surface ulcer and no evidence of active colitis.  Also had 1 tubular adenoma removed from the rectum/4 mm.  Patient has no current GI complaints, has maintained Protonix 40 mg twice daily. Unaware of any melena or hematochezia and follow-up hemoglobin 01/09/2021 up to 11.3 from a low of 9.4 while he was in the hospital.  #2 remote history of rectal cancer/diagnosed 2009 status postresection chemotherapy and radiation #3 history of CVA with residual left-sided weakness 4.  History of atrial fibrillation 5.  Chronic kidney disease stage II 6.  Hypertension 7.  Adult onset diabetes mellitus  Plan; continue Protonix 40 mg p.o. twice daily, refill sent x1 year Patient is just starting oral iron per PCP to be given q. OD. Follow-up CBC today, he will need follow-up iron studies per PCP in 4 months Follow-up colonoscopy in 5 to 7 years/per Dr. Ardis Hughs We will plan to see him back in 1 year, or sooner if needed.  Amila Callies Genia Harold PA-C 02/07/2021   Cc: Ladell Pier, MD

## 2021-02-08 ENCOUNTER — Other Ambulatory Visit (HOSPITAL_COMMUNITY): Payer: Self-pay

## 2021-02-08 ENCOUNTER — Telehealth: Payer: Self-pay | Admitting: Internal Medicine

## 2021-02-08 ENCOUNTER — Ambulatory Visit (HOSPITAL_COMMUNITY)
Admission: RE | Admit: 2021-02-08 | Discharge: 2021-02-08 | Disposition: A | Discharge status: Home / Self Care | Payer: BC Managed Care – PPO | Source: Ambulatory Visit | Attending: Internal Medicine | Admitting: Internal Medicine

## 2021-02-08 DIAGNOSIS — S31000A Unspecified open wound of lower back and pelvis without penetration into retroperitoneum, initial encounter: Secondary | ICD-10-CM | POA: Insufficient documentation

## 2021-02-08 DIAGNOSIS — D649 Anemia, unspecified: Secondary | ICD-10-CM | POA: Diagnosis not present

## 2021-02-08 DIAGNOSIS — G9389 Other specified disorders of brain: Secondary | ICD-10-CM | POA: Diagnosis not present

## 2021-02-08 DIAGNOSIS — X58XXXA Exposure to other specified factors, initial encounter: Secondary | ICD-10-CM | POA: Insufficient documentation

## 2021-02-08 DIAGNOSIS — R42 Dizziness and giddiness: Secondary | ICD-10-CM | POA: Diagnosis not present

## 2021-02-08 DIAGNOSIS — M109 Gout, unspecified: Secondary | ICD-10-CM | POA: Diagnosis not present

## 2021-02-08 DIAGNOSIS — H814 Vertigo of central origin: Secondary | ICD-10-CM | POA: Insufficient documentation

## 2021-02-08 DIAGNOSIS — I10 Essential (primary) hypertension: Secondary | ICD-10-CM | POA: Diagnosis not present

## 2021-02-08 DIAGNOSIS — E119 Type 2 diabetes mellitus without complications: Secondary | ICD-10-CM | POA: Diagnosis not present

## 2021-02-08 DIAGNOSIS — I69328 Other speech and language deficits following cerebral infarction: Secondary | ICD-10-CM | POA: Diagnosis not present

## 2021-02-08 DIAGNOSIS — Z9049 Acquired absence of other specified parts of digestive tract: Secondary | ICD-10-CM | POA: Diagnosis not present

## 2021-02-08 DIAGNOSIS — Z6834 Body mass index (BMI) 34.0-34.9, adult: Secondary | ICD-10-CM | POA: Diagnosis not present

## 2021-02-08 DIAGNOSIS — I69354 Hemiplegia and hemiparesis following cerebral infarction affecting left non-dominant side: Secondary | ICD-10-CM | POA: Diagnosis not present

## 2021-02-08 DIAGNOSIS — E785 Hyperlipidemia, unspecified: Secondary | ICD-10-CM | POA: Diagnosis not present

## 2021-02-08 DIAGNOSIS — E669 Obesity, unspecified: Secondary | ICD-10-CM | POA: Diagnosis not present

## 2021-02-08 DIAGNOSIS — G40909 Epilepsy, unspecified, not intractable, without status epilepticus: Secondary | ICD-10-CM | POA: Diagnosis not present

## 2021-02-08 DIAGNOSIS — L89894 Pressure ulcer of other site, stage 4: Secondary | ICD-10-CM | POA: Diagnosis not present

## 2021-02-08 DIAGNOSIS — Z96642 Presence of left artificial hip joint: Secondary | ICD-10-CM | POA: Diagnosis not present

## 2021-02-08 DIAGNOSIS — Z85048 Personal history of other malignant neoplasm of rectum, rectosigmoid junction, and anus: Secondary | ICD-10-CM | POA: Diagnosis not present

## 2021-02-08 DIAGNOSIS — I69322 Dysarthria following cerebral infarction: Secondary | ICD-10-CM | POA: Diagnosis not present

## 2021-02-08 DIAGNOSIS — I4891 Unspecified atrial fibrillation: Secondary | ICD-10-CM | POA: Diagnosis not present

## 2021-02-08 NOTE — Telephone Encounter (Signed)
PC placed to pt this afternoon.  I left VMM on his home phone letting him know that the CT of the head showed no acute changes.  I also called his wife Michelle's cell phone number and left same message on her voicemail.

## 2021-02-08 NOTE — Telephone Encounter (Signed)
Contacted pt to go over appt for CT. CT was schedule for 02/09/20 at 215pm at [facility]. Pt will need to arrive at 145pm. Contacted pt wife to make aware of appt. Per pt wife she is not able to take him today. Provided phone number for pt wife to call and reschedule.

## 2021-02-11 ENCOUNTER — Telehealth: Payer: Self-pay | Admitting: Internal Medicine

## 2021-02-11 DIAGNOSIS — D649 Anemia, unspecified: Secondary | ICD-10-CM | POA: Diagnosis not present

## 2021-02-11 DIAGNOSIS — M109 Gout, unspecified: Secondary | ICD-10-CM | POA: Diagnosis not present

## 2021-02-11 DIAGNOSIS — I69354 Hemiplegia and hemiparesis following cerebral infarction affecting left non-dominant side: Secondary | ICD-10-CM | POA: Diagnosis not present

## 2021-02-11 DIAGNOSIS — E119 Type 2 diabetes mellitus without complications: Secondary | ICD-10-CM | POA: Diagnosis not present

## 2021-02-11 DIAGNOSIS — I69322 Dysarthria following cerebral infarction: Secondary | ICD-10-CM | POA: Diagnosis not present

## 2021-02-11 DIAGNOSIS — I4891 Unspecified atrial fibrillation: Secondary | ICD-10-CM | POA: Diagnosis not present

## 2021-02-11 DIAGNOSIS — I10 Essential (primary) hypertension: Secondary | ICD-10-CM | POA: Diagnosis not present

## 2021-02-11 DIAGNOSIS — E785 Hyperlipidemia, unspecified: Secondary | ICD-10-CM | POA: Diagnosis not present

## 2021-02-11 DIAGNOSIS — I69328 Other speech and language deficits following cerebral infarction: Secondary | ICD-10-CM | POA: Diagnosis not present

## 2021-02-11 DIAGNOSIS — Z85048 Personal history of other malignant neoplasm of rectum, rectosigmoid junction, and anus: Secondary | ICD-10-CM | POA: Diagnosis not present

## 2021-02-11 DIAGNOSIS — Z9049 Acquired absence of other specified parts of digestive tract: Secondary | ICD-10-CM | POA: Diagnosis not present

## 2021-02-11 DIAGNOSIS — E669 Obesity, unspecified: Secondary | ICD-10-CM | POA: Diagnosis not present

## 2021-02-11 DIAGNOSIS — L89894 Pressure ulcer of other site, stage 4: Secondary | ICD-10-CM | POA: Diagnosis not present

## 2021-02-11 DIAGNOSIS — G40909 Epilepsy, unspecified, not intractable, without status epilepticus: Secondary | ICD-10-CM | POA: Diagnosis not present

## 2021-02-11 DIAGNOSIS — Z6834 Body mass index (BMI) 34.0-34.9, adult: Secondary | ICD-10-CM | POA: Diagnosis not present

## 2021-02-11 DIAGNOSIS — Z96642 Presence of left artificial hip joint: Secondary | ICD-10-CM | POA: Diagnosis not present

## 2021-02-11 NOTE — Telephone Encounter (Signed)
Home Health Verbal Orders - Caller/Agency: [facility] Number: [phone]  Requesting OT/PT/Skilled Nursing/Social Work/Speech Therapy: PT  Frequency: 1w6

## 2021-02-12 ENCOUNTER — Telehealth: Payer: Self-pay | Admitting: Physical Medicine and Rehabilitation

## 2021-02-12 DIAGNOSIS — Z6834 Body mass index (BMI) 34.0-34.9, adult: Secondary | ICD-10-CM | POA: Diagnosis not present

## 2021-02-12 DIAGNOSIS — E785 Hyperlipidemia, unspecified: Secondary | ICD-10-CM | POA: Diagnosis not present

## 2021-02-12 DIAGNOSIS — I69328 Other speech and language deficits following cerebral infarction: Secondary | ICD-10-CM | POA: Diagnosis not present

## 2021-02-12 DIAGNOSIS — M109 Gout, unspecified: Secondary | ICD-10-CM | POA: Diagnosis not present

## 2021-02-12 DIAGNOSIS — E119 Type 2 diabetes mellitus without complications: Secondary | ICD-10-CM | POA: Diagnosis not present

## 2021-02-12 DIAGNOSIS — I69322 Dysarthria following cerebral infarction: Secondary | ICD-10-CM | POA: Diagnosis not present

## 2021-02-12 DIAGNOSIS — L89894 Pressure ulcer of other site, stage 4: Secondary | ICD-10-CM | POA: Diagnosis not present

## 2021-02-12 DIAGNOSIS — I69354 Hemiplegia and hemiparesis following cerebral infarction affecting left non-dominant side: Secondary | ICD-10-CM | POA: Diagnosis not present

## 2021-02-12 DIAGNOSIS — Z96642 Presence of left artificial hip joint: Secondary | ICD-10-CM | POA: Diagnosis not present

## 2021-02-12 DIAGNOSIS — G40909 Epilepsy, unspecified, not intractable, without status epilepticus: Secondary | ICD-10-CM | POA: Diagnosis not present

## 2021-02-12 DIAGNOSIS — Z9049 Acquired absence of other specified parts of digestive tract: Secondary | ICD-10-CM | POA: Diagnosis not present

## 2021-02-12 DIAGNOSIS — E669 Obesity, unspecified: Secondary | ICD-10-CM | POA: Diagnosis not present

## 2021-02-12 DIAGNOSIS — Z85048 Personal history of other malignant neoplasm of rectum, rectosigmoid junction, and anus: Secondary | ICD-10-CM | POA: Diagnosis not present

## 2021-02-12 DIAGNOSIS — D649 Anemia, unspecified: Secondary | ICD-10-CM | POA: Diagnosis not present

## 2021-02-12 DIAGNOSIS — I4891 Unspecified atrial fibrillation: Secondary | ICD-10-CM | POA: Diagnosis not present

## 2021-02-12 DIAGNOSIS — I10 Essential (primary) hypertension: Secondary | ICD-10-CM | POA: Diagnosis not present

## 2021-02-12 NOTE — Telephone Encounter (Signed)
Received a message from the unit secretary that [facility] called for prior authorization on an air mattress for the patient. To call [employer] at [phone] and do peer to peer on case [id number]. Called [employer] for information/details and they reported that the order did not come/was not related to inpatient stay or come from [place]. Chart reviewed and note that patient  is followed for wound. Left msg at Wound center [facility] and send secure chat for Dr. Wynetta Emery to follow up on this.

## 2021-02-13 ENCOUNTER — Telehealth: Payer: Self-pay

## 2021-02-13 NOTE — Telephone Encounter (Signed)
At request of Dr Wynetta Emery, I called [employer] regarding the order for the air mattress. I spoke to [facility] who explained that the order is under review with [employer].  The order  was placed 12/13/2020 from [facility] by Loralee Pacas for [place], [place].

## 2021-02-14 ENCOUNTER — Other Ambulatory Visit: Payer: Self-pay

## 2021-02-14 ENCOUNTER — Encounter (HOSPITAL_BASED_OUTPATIENT_CLINIC_OR_DEPARTMENT_OTHER): Payer: BC Managed Care – PPO | Attending: Internal Medicine | Admitting: Internal Medicine

## 2021-02-14 DIAGNOSIS — I69359 Hemiplegia and hemiparesis following cerebral infarction affecting unspecified side: Secondary | ICD-10-CM | POA: Diagnosis not present

## 2021-02-14 DIAGNOSIS — L89153 Pressure ulcer of sacral region, stage 3: Secondary | ICD-10-CM | POA: Insufficient documentation

## 2021-02-14 DIAGNOSIS — N182 Chronic kidney disease, stage 2 (mild): Secondary | ICD-10-CM | POA: Diagnosis not present

## 2021-02-14 DIAGNOSIS — L89154 Pressure ulcer of sacral region, stage 4: Secondary | ICD-10-CM | POA: Diagnosis not present

## 2021-02-14 DIAGNOSIS — E1165 Type 2 diabetes mellitus with hyperglycemia: Secondary | ICD-10-CM | POA: Diagnosis not present

## 2021-02-14 DIAGNOSIS — M4628 Osteomyelitis of vertebra, sacral and sacrococcygeal region: Secondary | ICD-10-CM | POA: Diagnosis not present

## 2021-02-14 DIAGNOSIS — G40909 Epilepsy, unspecified, not intractable, without status epilepticus: Secondary | ICD-10-CM | POA: Insufficient documentation

## 2021-02-14 DIAGNOSIS — I129 Hypertensive chronic kidney disease with stage 1 through stage 4 chronic kidney disease, or unspecified chronic kidney disease: Secondary | ICD-10-CM | POA: Diagnosis not present

## 2021-02-14 DIAGNOSIS — I4891 Unspecified atrial fibrillation: Secondary | ICD-10-CM | POA: Insufficient documentation

## 2021-02-14 DIAGNOSIS — L89300 Pressure ulcer of unspecified buttock, unstageable: Secondary | ICD-10-CM | POA: Diagnosis not present

## 2021-02-14 NOTE — Telephone Encounter (Signed)
Call placed to patient and informed him that if he receives a denial letter regarding his air mattress to please let us know and send a copy so that we can try to do an appeal. The patient asked that I also call his wife, she was at work.  Call placed to his wife, [name], and left voicemail message requesting a call back to this CM

## 2021-02-15 DIAGNOSIS — I4891 Unspecified atrial fibrillation: Secondary | ICD-10-CM | POA: Diagnosis not present

## 2021-02-15 DIAGNOSIS — Z9089 Acquired absence of other organs: Secondary | ICD-10-CM | POA: Diagnosis not present

## 2021-02-15 DIAGNOSIS — I69354 Hemiplegia and hemiparesis following cerebral infarction affecting left non-dominant side: Secondary | ICD-10-CM | POA: Diagnosis not present

## 2021-02-15 DIAGNOSIS — Z9049 Acquired absence of other specified parts of digestive tract: Secondary | ICD-10-CM | POA: Diagnosis not present

## 2021-02-15 DIAGNOSIS — M109 Gout, unspecified: Secondary | ICD-10-CM | POA: Diagnosis not present

## 2021-02-15 DIAGNOSIS — G40909 Epilepsy, unspecified, not intractable, without status epilepticus: Secondary | ICD-10-CM | POA: Diagnosis not present

## 2021-02-15 DIAGNOSIS — E785 Hyperlipidemia, unspecified: Secondary | ICD-10-CM | POA: Diagnosis not present

## 2021-02-15 DIAGNOSIS — L89894 Pressure ulcer of other site, stage 4: Secondary | ICD-10-CM | POA: Diagnosis not present

## 2021-02-15 DIAGNOSIS — D509 Iron deficiency anemia, unspecified: Secondary | ICD-10-CM | POA: Diagnosis not present

## 2021-02-15 DIAGNOSIS — I69328 Other speech and language deficits following cerebral infarction: Secondary | ICD-10-CM | POA: Diagnosis not present

## 2021-02-15 DIAGNOSIS — I69322 Dysarthria following cerebral infarction: Secondary | ICD-10-CM | POA: Diagnosis not present

## 2021-02-15 DIAGNOSIS — I10 Essential (primary) hypertension: Secondary | ICD-10-CM | POA: Diagnosis not present

## 2021-02-15 DIAGNOSIS — E669 Obesity, unspecified: Secondary | ICD-10-CM | POA: Diagnosis not present

## 2021-02-15 DIAGNOSIS — E119 Type 2 diabetes mellitus without complications: Secondary | ICD-10-CM | POA: Diagnosis not present

## 2021-02-15 DIAGNOSIS — I700 Atherosclerosis of aorta: Secondary | ICD-10-CM | POA: Diagnosis not present

## 2021-02-15 DIAGNOSIS — Z79891 Long term (current) use of opiate analgesic: Secondary | ICD-10-CM | POA: Diagnosis not present

## 2021-02-15 NOTE — Progress Notes (Signed)
Mark, [name] ([id number]) Visit Report for 02/14/2021 Arrival Information Details Patient Name: Date of Service: [name] IN D. 02/14/2021 9:30 A M Medical Record Number: [id number] Patient Account Number: [id number] Date of Birth/Sex: Treating RN: 26-Oct-1965 (56 y.o. [name] Primary Care [name]: [name] Other Clinician: Referring [name]: Treating [name]/Extender: [name] in Treatment: 3 Visit Information History Since Last Visit Added or deleted any medications: No Patient Arrived: Wheel Chair Any new allergies or adverse reactions: No Arrival Time: 09:35 Had a fall or experienced change in No Accompanied By: wife activities of daily living that may affect Transfer Assistance: Manual risk of falls: Patient Identification Verified: Yes Signs or symptoms of abuse/neglect since last visito No Secondary Verification Process Completed: Yes Hospitalized since last visit: No Patient Requires Transmission-Based Precautions: No Implantable device outside of the clinic excluding No Patient Has Alerts: No cellular tissue based products placed in the center since last visit: Has Dressing in Place as Prescribed: Yes Pain Present Now: No Electronic Signature(s) Signed: 02/14/2021 4:09:06 PM By: Mark Pilling RN, BSN Entered By: [name] on 02/14/2021 09:35:37 -------------------------------------------------------------------------------- Encounter Discharge Information Details Patient Name: Date of Service: [name], Mark D. 02/14/2021 9:30 A M Medical Record Number: [id number] Patient Account Number: [id number] Date of Birth/Sex: Treating RN: 1965-03-06 (56 y.o. [name] Primary Care [name]: [name] Other Clinician: Referring Imya Mance: Treating [name]/Extender: [name] in Treatment: 3 Encounter Discharge Information Items Discharge Condition: Stable Ambulatory  Status: Wheelchair Discharge Destination: Home Transportation: Other Accompanied By: family member Schedule Follow-up Appointment: Yes Clinical Summary of Care: Electronic Signature(s) Signed: 02/14/2021 4:09:06 PM By: Mark Pilling RN, BSN Entered By: [name] on 02/14/2021 10:01:35 -------------------------------------------------------------------------------- Lower Extremity Assessment Details Patient Name: Date of Service: [name] IN D. 02/14/2021 9:30 A M Medical Record Number: [id number] Patient Account Number: [id number] Date of Birth/Sex: Treating RN: 02-19-1965 (56 y.o. [name] Primary Care [name]: [name] Other Clinician: Referring [name]: Treating [name]/Extender: [name] in Treatment: 3 Electronic Signature(s) Signed: 02/14/2021 4:09:06 PM By: Mark Pilling RN, BSN Entered By: [name] on 02/14/2021 09:36:34 -------------------------------------------------------------------------------- Multi Wound Chart Details Patient Name: Date of Service: [name], Mark Copas IN D. 02/14/2021 9:30 A M Medical Record Number: [id number] Patient Account Number: [id number] Date of Birth/Sex: Treating RN: [birth year] (56 y.o. [name] Primary Care [name]: [name] Other Clinician: Referring [name]: Treating [name]/Extender: [name] in Treatment: 3 Vital Signs Height(in): 64 Pulse(bpm): 89 Weight(lbs): Blood Pressure(mmHg): 116/78 Body Mass Index(BMI): Temperature(F): 99 Respiratory Rate(breaths/min): 16 Photos: [N/A:N/A] Sacrum N/A N/A Wound Location: Pressure Injury N/A N/A Wounding Event: Pressure Ulcer N/A N/A Primary Etiology: Hypertension, Type II Diabetes, N/A N/A Comorbid History: Paraplegia, Seizure Disorder 11/13/2020 N/A N/A Date Acquired: 3 N/A N/A Weeks of Treatment: Open N/A N/A Wound Status: No N/A N/A Wound  Recurrence: 2.2x2.5x2.5 N/A N/A Measurements L x W x D (cm) 4.32 N/A N/A A (cm) : rea 10.799 N/A N/A Volume (cm) : 37.10% N/A N/A % Reduction in A rea: 1.80% N/A N/A % Reduction in Volume: 12 Starting Position 1 (o'clock): 2 Ending Position 1 (o'clock): 2 Maximum Distance 1 (cm): Yes N/A N/A Undermining: Category/Stage IV N/A N/A Classification: Medium N/A N/A Exudate A mount: Serosanguineous N/A N/A Exudate Type: red, brown N/A N/A Exudate Color: Distinct, outline attached N/A N/A Wound Margin: Large (67-100%) N/A N/A Granulation Amount: Red, Pink, Hyper-granulation, Friable N/A N/A Granulation Quality: None Present (  0%) N/A N/A Necrotic Amount: Fat Layer (Subcutaneous Tissue): Yes N/A N/A Exposed Structures: Bone: Yes Fascia: No Tendon: No Muscle: No Joint: No Small (1-33%) N/A N/A Epithelialization: Chemical Cauterization N/A N/A Procedures Performed: Treatment Notes Wound #1 (Sacrum) Cleanser Wound Cleanser Discharge Instruction: Cleanse the wound with wound cleanser prior to applying a clean dressing using gauze sponges, not tissue or cotton balls. Peri-Wound Care Skin Prep Discharge Instruction: Use skin prep as directed Topical Primary Dressing Dakin's Solution moisten gauze Discharge Instruction: Dakin's moisten gauze directly to undermining and wound bed. Secondary Dressing Woven Gauze Sponge, Non-Sterile 4x4 in Discharge Instruction: Apply over primary dressing as directed. ABD Pad, 5x9 Discharge Instruction: Apply over primary dressing as directed. Secured With 59M Medipore H Soft Cloth Surgical T [name], 4 x 10 (in/yd) Discharge Instruction: Secure with tape as directed. Compression Wrap Compression Stockings Add-Ons Electronic Signature(s) Signed: 02/14/2021 10:09:26 AM By: Mark Shan DO Signed: 02/15/2021 1:32:11 PM By: [name] Entered By: [name] on 02/14/2021  10:03:10 -------------------------------------------------------------------------------- Multi-Disciplinary Care Plan Details Patient Name: Date of Service: [name], Mark Copas IN D. 02/14/2021 9:30 A M Medical Record Number: [id number] Patient Account Number: [id number] Date of Birth/Sex: Treating RN: Aug 16, 1965 (56 y.o. [name], [name] Primary Care [name]: [name] Other Clinician: Referring [name]: Treating Redell Nazir/Extender: [name] in Treatment: 3 Active Inactive Abuse / Safety / Falls / Self Care Management Nursing Diagnoses: Potential for falls Potential for injury related to falls Goals: Patient will remain injury free related to falls Date Initiated: 01/18/2021 Target Resolution Date: 03/07/2021 Goal Status: Active Patient/caregiver will verbalize/demonstrate measure taken to improve self care Date Initiated: 01/18/2021 Target Resolution Date: 03/07/2021 Goal Status: Active Interventions: Assess: immobility, friction, shearing, incontinence upon admission and as needed Assess self care needs on admission and as needed Notes: Pressure Nursing Diagnoses: Knowledge deficit related to causes and risk factors for pressure ulcer development Potential for impaired tissue integrity related to pressure, friction, moisture, and shear Goals: Patient will remain free from development of additional pressure ulcers Date Initiated: 01/18/2021 Target Resolution Date: 03/22/2021 Goal Status: Active Patient/caregiver will verbalize risk factors for pressure ulcer development Date Initiated: 01/18/2021 Target Resolution Date: 03/22/2021 Goal Status: Active Interventions: Assess: immobility, friction, shearing, incontinence upon admission and as needed Assess offloading mechanisms upon admission and as needed Assess potential for pressure ulcer upon admission and as needed Provide education on pressure ulcers Treatment Activities: Patient referred  for home evaluation of offloading devices/mattresses : 01/18/2021 Patient referred for pressure reduction/relief devices : 01/18/2021 Pressure reduction/relief device ordered : 01/18/2021 Notes: Wound/Skin Impairment Nursing Diagnoses: Knowledge deficit related to ulceration/compromised skin integrity Goals: Patient/caregiver will verbalize understanding of skin care regimen Date Initiated: 01/18/2021 Target Resolution Date: 03/07/2021 Goal Status: Active Interventions: Assess patient/caregiver ability to perform ulcer/skin care regimen upon admission and as needed Assess ulceration(s) every visit Provide education on ulcer and skin care Treatment Activities: Skin care regimen initiated : 01/18/2021 Topical wound management initiated : 01/18/2021 Notes: Electronic Signature(s) Signed: 02/14/2021 4:09:06 PM By: Mark Pilling RN, BSN Entered By: [name] on 02/14/2021 09:45:59 -------------------------------------------------------------------------------- Pain Assessment Details Patient Name: Date of Service: [name] IN D. 02/14/2021 9:30 A M Medical Record Number: [id number] Patient Account Number: [id number] Date of Birth/Sex: Treating RN: 07/11/1965 (56 y.o. [name] Primary Care Dewaun Kinzler: [name] Other Clinician: Referring Chanise Habeck: Treating Rufina Kimery/Extender: [name] in Treatment: 3 Active Problems Location of Pain Severity and Description of Pain Patient Has Paino No Site Locations Rate  the pain. Current Pain Level: 0 Pain Management and Medication Current Pain Management: Medication: No Cold Application: No Rest: No Massage: No Activity: No T.E.N.S.: No Heat Application: No Leg drop or elevation: No Is the Current Pain Management Adequate: Adequate How does your wound impact your activities of daily livingo Sleep: No Bathing: No Appetite: No Relationship With Others: No Bladder Continence: No Emotions:  No Bowel Continence: No Work: No Toileting: No Drive: No Dressing: No Hobbies: No Engineer, maintenance) Signed: 02/14/2021 4:09:06 PM By: Mark Pilling RN, BSN Entered By: [name] on 02/14/2021 09:36:27 -------------------------------------------------------------------------------- Patient/Caregiver Education Details Patient Name: Date of Service: [name] IN D. 2/2/2023andnbsp9:30 A M Medical Record Number: [id number] Patient Account Number: [id number] Date of Birth/Gender: Treating RN: 07-10-1965 (56 y.o. [name] Primary Care Physician: [name] Other Clinician: Referring Physician: Treating Physician/Extender: [name] in Treatment: 3 Education Assessment Education Provided To: Patient and Caregiver Education Topics Provided Wound/Skin Impairment: Handouts: Skin Care Do's and Dont's Methods: Explain/Verbal Responses: Reinforcements needed Electronic Signature(s) Signed: 02/14/2021 4:09:06 PM By: Mark Pilling RN, BSN Entered By: [name] on 02/14/2021 09:46:24 -------------------------------------------------------------------------------- Wound Assessment Details Patient Name: Date of Service: [name] IN D. 02/14/2021 9:30 A M Medical Record Number: [id number] Patient Account Number: [id number] Date of Birth/Sex: Treating RN: Jan 14, 1965 (56 y.o. [name], [name] Primary Care Athelene Hursey: [name] Other Clinician: Referring Lakesha Levinson: Treating Jailynn Lavalais/Extender: [name] in Treatment: 3 Wound Status Wound Number: 1 Primary Etiology: Pressure Ulcer Wound Location: Sacrum Wound Status: Open Wounding Event: Pressure Injury Comorbid Hypertension, Type II Diabetes, Paraplegia, Seizure History: Disorder Date Acquired: 11/13/2020 Weeks Of Treatment: 3 Clustered Wound: No Photos Wound Measurements Length: (cm) 2.2 Width: (cm) 2.5 Depth: (cm) 2.5 Area: (cm)  4.32 Volume: (cm) 10.799 % Reduction in Area: 37.1% % Reduction in Volume: 1.8% Epithelialization: Small (1-33%) Tunneling: No Undermining: Yes Starting Position (o'clock): 12 Ending Position (o'clock): 2 Maximum Distance: (cm) 2 Wound Description Classification: Category/Stage IV Wound Margin: Distinct, outline attached Exudate Amount: Medium Exudate Type: Serosanguineous Exudate Color: red, brown Wound Bed Granulation Amount: Large (67-100%) Granulation Quality: Red, Pink, Hyper-granulation, [name] Necrotic Amount: None Present (0%) Foul Odor After Cleansing: No Slough/Fibrino No Exposed Structure e Fascia Exposed: No Fat Layer (Subcutaneous Tissue) Exposed: Yes Tendon Exposed: No Muscle Exposed: No Joint Exposed: No Bone Exposed: Yes Treatment Notes Wound #1 (Sacrum) Cleanser Wound Cleanser Discharge Instruction: Cleanse the wound with wound cleanser prior to applying a clean dressing using gauze sponges, not tissue or cotton balls. Peri-Wound Care Skin Prep Discharge Instruction: Use skin prep as directed Topical Primary Dressing Dakin's Solution moisten gauze Discharge Instruction: Dakin's moisten gauze directly to undermining and wound bed. Secondary Dressing Woven Gauze Sponge, Non-Sterile 4x4 in Discharge Instruction: Apply over primary dressing as directed. ABD Pad, 5x9 Discharge Instruction: Apply over primary dressing as directed. Secured With [name], 4 x 10 (in/yd) Discharge Instruction: Secure with tape as directed. Compression Wrap Compression Stockings Add-Ons Electronic Signature(s) Signed: 02/14/2021 4:09:06 PM By: Mark Pilling RN, BSN Entered By: [name] on 02/14/2021 09:52:00 -------------------------------------------------------------------------------- Vitals Details Patient Name: Date of Service: [name], Mark Copas IN D. 02/14/2021 9:30 A M Medical Record Number: [id number] Patient Account Number:  [id number] Date of Birth/Sex: Treating RN: 1965/04/06 (56 y.o. [name] Primary Care Laquisha Northcraft: [name] Other Clinician: Referring Imojean Yoshino: Treating Lytle Malburg/Extender: [name] in Treatment: 3 Vital Signs Time Taken: 09:35 Temperature (F): 99 Height (  in): 64 Pulse (bpm): 83 Respiratory Rate (breaths/min): 16 Blood Pressure (mmHg): 116/78 Reference Range: 80 - 120 mg / dl Electronic Signature(s) Signed: 02/14/2021 4:09:06 PM By: Mark Pilling RN, BSN Entered By: [name] on 02/14/2021 09:36:18

## 2021-02-15 NOTE — Progress Notes (Signed)
[name], [name] ([id number]) Visit Report for 02/14/2021 Chief Complaint Document Details Patient Name: Date of Service: [name] IN D. 02/14/2021 9:30 A M Medical Record Number: [id number] Patient Account Number: [id number] Date of Birth/Sex: Treating RN: 10/05/1965 (56 y.o. [name] Primary Care Provider: Karle Plumber Other Clinician: Referring Provider: Treating Provider/Extender: Sammuel Bailiff in Treatment: 3 Information Obtained from: Patient Chief Complaint Sacral ulcer Electronic Signature(s) Signed: 02/14/2021 10:09:26 AM By: Kalman Shan DO Entered By: Kalman Shan on 02/14/2021 10:03:20 -------------------------------------------------------------------------------- HPI Details Patient Name: Date of Service: [name], [name] IN D. 02/14/2021 9:30 A M Medical Record Number: [id number] Patient Account Number: [id number] Date of Birth/Sex: Treating RN: 07-Nov-1965 (56 y.o. [name] Primary Care Provider: Karle Plumber Other Clinician: Referring Provider: Treating Provider/Extender: Sammuel Bailiff in Treatment: 3 History of Present Illness HPI Description: Admission 01/18/2021 [name] is a 56 year old male with a past medical history of CVA with left-sided hemiparesis, language and speech deficits, hypertension, and seizure disorder that presents the clinic for a 35-month history of sacral ulcer. He was hospitalized on 11/23/2020 for new onset A. fib and GI bleed. He states that during this hospitalization he developed a pressure ulcer to his sacrum. He reports using Santyl to the wound bed twice daily. He has a group 2 air mattress and states he has been offloading the area by staying on his side. He currently denies signs of infection. He reports improvement in wound healing over the past month. 1/19; patient presents for follow-up. His pharmacy has ordered Dakin's but is not  available yet to the patient. He has continued with Santyl daily. He has no issues or complaints today. He denies signs of infection. 1/26; patient presents for follow-up. He has been using Hydrofera Blue to the wound beds. He has no issues or complaints today. He denies signs of infection. 2/2; patient presents for follow-up. He has been using Hydrofera Blue to the wound beds. He has no issues or complaints today. He had an x-ray that showed new mild cortical erosion at the junction of the first and second coccygeal segments concerning for acute osteomyelitis. He currently denies systemic signs of infection. Electronic Signature(s) Signed: 02/14/2021 10:09:26 AM By: Kalman Shan DO Entered By: Kalman Shan on 02/14/2021 10:05:04 -------------------------------------------------------------------------------- Chemical Cauterization Details Patient Name: Date of Service: [name], [name] IN D. 02/14/2021 9:30 A M Medical Record Number: [id number] Patient Account Number: [id number] Date of Birth/Sex: Treating RN: 11/14/1965 (56 y.o. [name] Primary Care Provider: Karle Plumber Other Clinician: Referring Provider: Treating Provider/Extender: Sammuel Bailiff in Treatment: 3 Procedure Performed for: Wound #1 Sacrum Performed By: Physician Kalman Shan, DO Post Procedure Diagnosis Same as Pre-procedure Notes Silver nitrate used. Electronic Signature(s) Signed: 02/14/2021 10:09:26 AM By: Kalman Shan DO Signed: 02/14/2021 4:09:06 PM By: Deon Pilling RN, BSN Entered By: Deon Pilling on 02/14/2021 09:54:46 -------------------------------------------------------------------------------- Physical Exam Details Patient Name: Date of Service: [name] IN D. 02/14/2021 9:30 A M Medical Record Number: [id number] Patient Account Number: [id number] Date of Birth/Sex: Treating RN: 11/19/1965 (56 y.o. [name] Primary Care Provider: Karle Plumber Other Clinician: Referring Provider: Treating Provider/Extender: Sammuel Bailiff in Treatment: 3 Constitutional respirations regular, non-labored and within target range for patient.Marland Kitchen Psychiatric pleasant and cooperative. Notes Sacral ulcer with hyper granulated tissue throughout. Tunneling at the 12 o'clock position that probes close to bone. No signs of infection. Electronic Signature(s) Signed: 02/14/2021 10:09:26 AM By:  Kalman Shan DO Entered By: Kalman Shan on 02/14/2021 10:05:37 -------------------------------------------------------------------------------- Physician Orders Details Patient Name: Date of Service: [name] IN D. 02/14/2021 9:30 A M Medical Record Number: [id number] Patient Account Number: [id number] Date of Birth/Sex: Treating RN: 1965/11/02 (56 y.o. [name] Primary Care Provider: Karle Plumber Other Clinician: Referring Provider: Treating Provider/Extender: Sammuel Bailiff in Treatment: 3 Verbal / Phone Orders: No Diagnosis Coding ICD-10 Coding Code Description L89.153 Pressure ulcer of sacral region, stage 3 I69.359 Hemiplegia and hemiparesis following cerebral infarction affecting unspecified side E11.65 Type 2 diabetes mellitus with hyperglycemia Follow-up Appointments ppointment in 1 week. - Dr. Heber [place] and Tammi Klippel, RN Return A Other: - Infectious Disease office will call you to schedule an appt time. Off-Loading Wound #1 Sacrum Low air-loss mattress (Group 2) - continue to use. Turn and reposition every 2 hours Other: - continue to use specialty mattress. minimize in wheelchair to help aid in healing pressure wound. no pressure to sacral area to prevent worsening. Home Health Wound #1 Sacrum New wound care orders this week; continue Home Health for wound care. May utilize formulary equivalent dressing for wound treatment orders unless otherwise specified. - home  health to change once a week and all other days family to change. Other Home Health Orders/Instructions: - [facility] Wound Treatment Wound #1 - Sacrum Cleanser: Wound Cleanser ([place]) 2 x Per Day/30 Days Discharge Instructions: Cleanse the wound with wound cleanser prior to applying a clean dressing using gauze sponges, not tissue or cotton balls. Peri-Wound Care: Skin Prep (Home Health) 2 x Per Day/30 Days Discharge Instructions: Use skin prep as directed Prim Dressing: Dakin's Solution moisten gauze ([place]) 2 x Per Day/30 Days ary Discharge Instructions: Dakin's moisten gauze directly to undermining and wound bed. Secondary Dressing: Woven Gauze Sponge, Non-Sterile 4x4 in (Home Health) 2 x Per Day/30 Days Discharge Instructions: Apply over primary dressing as directed. Secondary Dressing: ABD Pad, 5x9 (Home Health) 2 x Per Day/30 Days Discharge Instructions: Apply over primary dressing as directed. Secured With: 75M Medipore H Soft Cloth Surgical T ape, 4 x 10 (in/yd) (Home Health) 2 x Per Day/30 Days Discharge Instructions: Secure with tape as directed. Consults Infectious Disease - Referral infectious disease related to positive osteomyelitis to coccyx via x-ray results. Electronic Signature(s) Signed: 02/14/2021 10:09:26 AM By: Kalman Shan DO Entered By: Kalman Shan on 02/14/2021 10:06:04 Prescription 02/14/2021 -------------------------------------------------------------------------------- [name] D. Kalman Shan DO Patient Name: Provider: 07/25/1965 [id number] Date of Birth: NPI#: Jerilynn Mages [id number] Sex: DEA #: [phone] Phone #: License #: [place] Patient Address: [address] [place], [place] [address] [place], [place] [address] [phone] Allergies No Known Allergies Provider's Orders Infectious Disease - Referral infectious disease related to positive  osteomyelitis to coccyx via x-ray results. Hand Signature: Date(s): Electronic Signature(s) Signed: 02/14/2021 10:09:26 AM By: Kalman Shan DO Entered By: Kalman Shan on 02/14/2021 10:06:05 -------------------------------------------------------------------------------- Problem List Details Patient Name: Date of Service: [name], [name] IN D. 02/14/2021 9:30 A M Medical Record Number: [id number] Patient Account Number: [id number] Date of Birth/Sex: Treating RN: 10-28-1965 (56 y.o. [name] Primary Care Provider: Karle Plumber Other Clinician: Referring Provider: Treating Provider/Extender: Sammuel Bailiff in Treatment: 3 Active Problems ICD-10 Encounter Code Description Active Date MDM Diagnosis L89.154 Pressure ulcer of sacral region, stage 4 02/14/2021 No Yes M46.28 Osteomyelitis of vertebra, sacral and sacrococcygeal region 02/14/2021 No Yes I69.359 Hemiplegia and hemiparesis  following cerebral infarction affecting unspecified 01/18/2021 No Yes side E11.65 Type 2 diabetes mellitus with hyperglycemia 01/18/2021 No Yes Inactive Problems Resolved Problems Electronic Signature(s) Signed: 02/14/2021 10:09:26 AM By: Kalman Shan DO Entered By: Kalman Shan on 02/14/2021 10:02:19 -------------------------------------------------------------------------------- Progress Note Details Patient Name: Date of Service: [name] IN D. 02/14/2021 9:30 A M Medical Record Number: [id number] Patient Account Number: [id number] Date of Birth/Sex: Treating RN: 06-11-1965 (56 y.o. [name] Primary Care Provider: Karle Plumber Other Clinician: Referring Provider: Treating Provider/Extender: Sammuel Bailiff in Treatment: 3 Subjective Chief Complaint Information obtained from Patient Sacral ulcer History of Present Illness (HPI) Admission 01/18/2021 Mr. Kweku Stankey is a 56 year old male with a past medical  history of CVA with left-sided hemiparesis, language and speech deficits, hypertension, and seizure disorder that presents the clinic for a 73-month history of sacral ulcer. He was hospitalized on 11/23/2020 for new onset A. fib and GI bleed. He states that during this hospitalization he developed a pressure ulcer to his sacrum. He reports using Santyl to the wound bed twice daily. He has a group 2 air mattress and states he has been offloading the area by staying on his side. He currently denies signs of infection. He reports improvement in wound healing over the past month. 1/19; patient presents for follow-up. His pharmacy has ordered Dakin's but is not available yet to the patient. He has continued with Santyl daily. He has no issues or complaints today. He denies signs of infection. 1/26; patient presents for follow-up. He has been using Hydrofera Blue to the wound beds. He has no issues or complaints today. He denies signs of infection. 2/2; patient presents for follow-up. He has been using Hydrofera Blue to the wound beds. He has no issues or complaints today. He had an x-ray that showed new mild cortical erosion at the junction of the first and second coccygeal segments concerning for acute osteomyelitis. He currently denies systemic signs of infection. Patient History Information obtained from Patient, Chart. Family History Unknown History. Social History Never smoker, Marital Status - Single, Alcohol Use - Never, Drug Use - Prior History, Caffeine Use - Rarely. Medical History Eyes Denies history of Cataracts, Glaucoma, Optic Neuritis Ear/Nose/Mouth/Throat Denies history of Chronic sinus problems/congestion, Middle ear problems Respiratory Denies history of Aspiration, Asthma, Chronic Obstructive Pulmonary Disease (COPD), Pneumothorax, Sleep Apnea, Tuberculosis Cardiovascular Patient has history of Hypertension Gastrointestinal Denies history of Cirrhosis , Colitis, Crohnoos,  Hepatitis A, Hepatitis B, Hepatitis C Endocrine Patient has history of Type II Diabetes Denies history of Type I Diabetes Musculoskeletal Denies history of Gout, Rheumatoid Arthritis, Osteoarthritis, Osteomyelitis Neurologic Patient has history of Paraplegia, Seizure Disorder Denies history of Dementia, Neuropathy, Quadriplegia Medical A Surgical History Notes nd Genitourinary CKD stage 2 Objective Constitutional respirations regular, non-labored and within target range for patient.. Vitals Time Taken: 9:35 AM, Height: 64 in, Temperature: 99 F, Pulse: 83 bpm, Respiratory Rate: 16 breaths/min, Blood Pressure: 116/78 mmHg. Psychiatric pleasant and cooperative. General Notes: Sacral ulcer with hyper granulated tissue throughout. Tunneling at the 12 o'clock position that probes close to bone. No signs of infection. Integumentary (Hair, Skin) Wound #1 status is Open. Original cause of wound was Pressure Injury. The date acquired was: 11/13/2020. The wound has been in treatment 3 weeks. The wound is located on the Sacrum. The wound measures 2.2cm length x 2.5cm width x 2.5cm depth; 4.32cm^2 area and 10.799cm^3 volume. There is bone and Fat Layer (Subcutaneous Tissue) exposed. There is no tunneling noted, however,  there is undermining starting at 12:00 and ending at 2:00 with a maximum distance of 2cm. There is a medium amount of serosanguineous drainage noted. The wound margin is distinct with the outline attached to the wound base. There is large (67-100%) red, pink, friable, hyper - granulation within the wound bed. There is no necrotic tissue within the wound bed. Assessment Active Problems ICD-10 Pressure ulcer of sacral region, stage 4 Osteomyelitis of vertebra, sacral and sacrococcygeal region Hemiplegia and hemiparesis following cerebral infarction affecting unspecified side Type 2 diabetes mellitus with hyperglycemia Patient's wound is stable. I used silver nitrate to the hyper  granulated areas. This is close to bone. Patient had an x-ray that was concerning for acute osteomyelitis. Unfortunately I cannot obtain a bone biopsy due to location. I recommended referral to infectious disease to discuss potential antibiotic course. At this time I recommended switching to Dakin's wet-to-dry dressing and aggressive offloading. Follow-up in 1 week. Procedures Wound #1 Pre-procedure diagnosis of Wound #1 is a Pressure Ulcer located on the Sacrum . An Chemical Cauterization procedure was performed by Kalman Shan, DO. Post procedure Diagnosis Wound #1: Same as Pre-Procedure Notes: Silver nitrate used. Plan Follow-up Appointments: Return Appointment in 1 week. - Dr. Heber [place] and Tammi Klippel, RN Other: - Infectious Disease office will call you to schedule an appt time. Off-Loading: Wound #1 Sacrum: Low air-loss mattress (Group 2) - continue to use. Turn and reposition every 2 hours Other: - continue to use specialty mattress. minimize in wheelchair to help aid in healing pressure wound. no pressure to sacral area to prevent worsening. Home Health: Wound #1 Sacrum: New wound care orders this week; continue Home Health for wound care. May utilize formulary equivalent dressing for wound treatment orders unless otherwise specified. - home health to change once a week and all other days family to change. Other Home Health Orders/Instructions: [facility] Consults ordered were: Infectious Disease - Referral infectious disease related to positive osteomyelitis to coccyx via x-ray results. WOUND #1: - Sacrum Wound Laterality: Cleanser: Wound Cleanser (Home Health) 2 x Per Day/30 Days Discharge Instructions: Cleanse the wound with wound cleanser prior to applying a clean dressing using gauze sponges, not tissue or cotton balls. Peri-Wound Care: Skin Prep (Home Health) 2 x Per Day/30 Days Discharge Instructions: Use skin prep as directed Prim Dressing: Dakin's Solution moisten gauze  ([place]) 2 x Per Day/30 Days ary Discharge Instructions: Dakin's moisten gauze directly to undermining and wound bed. Secondary Dressing: Woven Gauze Sponge, Non-Sterile 4x4 in (Home Health) 2 x Per Day/30 Days Discharge Instructions: Apply over primary dressing as directed. Secondary Dressing: ABD Pad, 5x9 (Home Health) 2 x Per Day/30 Days Discharge Instructions: Apply over primary dressing as directed. Secured With: 15M Medipore H Soft Cloth Surgical T ape, 4 x 10 (in/yd) (Home Health) 2 x Per Day/30 Days Discharge Instructions: Secure with tape as directed. 1. Dakin's wet-to-dry 2. Infectious disease referral 3. Follow-up in 1 week 4. Aggressive offloading Electronic Signature(s) Signed: 02/14/2021 10:09:26 AM By: Kalman Shan DO Entered By: Kalman Shan on 02/14/2021 10:08:49 -------------------------------------------------------------------------------- HxROS Details Patient Name: Date of Service: [name], [name] IN D. 02/14/2021 9:30 A M Medical Record Number: [id number] Patient Account Number: [id number] Date of Birth/Sex: Treating RN: June 22, 1965 (56 y.o. [name] Primary Care Provider: Karle Plumber Other Clinician: Referring Provider: Treating Provider/Extender: Sammuel Bailiff in Treatment: 3 Information Obtained From Patient Chart Eyes Medical History: Negative for: Cataracts; Glaucoma; Optic Neuritis Ear/Nose/Mouth/Throat Medical History: Negative for: Chronic  sinus problems/congestion; Middle ear problems Respiratory Medical History: Negative for: Aspiration; Asthma; Chronic Obstructive Pulmonary Disease (COPD); Pneumothorax; Sleep Apnea; Tuberculosis Cardiovascular Medical History: Positive for: Hypertension Gastrointestinal Medical History: Negative for: Cirrhosis ; Colitis; Crohns; Hepatitis A; Hepatitis B; Hepatitis C Endocrine Medical History: Positive for: Type II Diabetes Negative for: Type I  Diabetes Genitourinary Medical History: Past Medical History Notes: CKD stage 2 Musculoskeletal Medical History: Negative for: Gout; Rheumatoid Arthritis; Osteoarthritis; Osteomyelitis Neurologic Medical History: Positive for: Paraplegia; Seizure Disorder Negative for: Dementia; Neuropathy; Quadriplegia Immunizations Pneumococcal Vaccine: Received Pneumococcal Vaccination: Yes Received Pneumococcal Vaccination On or After 60th Birthday: Yes Implantable Devices None Family and Social History Unknown History: Yes; Never smoker; Marital Status - Single; Alcohol Use: Never; Drug Use: Prior History; Caffeine Use: Rarely; Financial Concerns: No; Food, Clothing or Shelter Needs: No; Support System Lacking: No; Transportation Concerns: No Electronic Signature(s) Signed: 02/14/2021 10:09:26 AM By: Kalman Shan DO Signed: 02/15/2021 1:32:11 PM By: Lorrin Jackson Entered By: Kalman Shan on 02/14/2021 10:05:12 -------------------------------------------------------------------------------- SuperBill Details Patient Name: Date of Service: [name], [place] D. 02/14/2021 Medical Record Number: [id number] Patient Account Number: [id number] Date of Birth/Sex: Treating RN: 01/26/1965 (56 y.o. [name] Primary Care Provider: Karle Plumber Other Clinician: Referring Provider: Treating Provider/Extender: Sammuel Bailiff in Treatment: 3 Diagnosis Coding ICD-10 Codes Code Description [id number] Pressure ulcer of sacral region, stage 4 M46.28 Osteomyelitis of vertebra, sacral and sacrococcygeal region I69.359 Hemiplegia and hemiparesis following cerebral infarction affecting unspecified side E11.65 Type 2 diabetes mellitus with hyperglycemia Facility Procedures CPT4 Code: [id number] Description: 74081 - CHEM CAUT GRANULATION TISS ICD-10 Diagnosis Description L89.154 Pressure ulcer of sacral region, stage 4 Modifier: Quantity: 1 Physician Procedures :  CPT4 Code Description Modifier [id number] - WC PHYS CHEM CAUT GRAN TISSUE ICD-10 Diagnosis Description L89.154 Pressure ulcer of sacral region, stage 4 Quantity: 1 Electronic Signature(s) Signed: 02/14/2021 10:20:42 AM By: Kalman Shan DO Previous Signature: 02/14/2021 10:09:26 AM Version By: Kalman Shan DO Entered By: Kalman Shan on 02/14/2021 10:20:06

## 2021-02-18 ENCOUNTER — Other Ambulatory Visit (HOSPITAL_COMMUNITY): Payer: Self-pay

## 2021-02-18 DIAGNOSIS — I69354 Hemiplegia and hemiparesis following cerebral infarction affecting left non-dominant side: Secondary | ICD-10-CM | POA: Diagnosis not present

## 2021-02-18 DIAGNOSIS — L89894 Pressure ulcer of other site, stage 4: Secondary | ICD-10-CM | POA: Diagnosis not present

## 2021-02-18 DIAGNOSIS — I69322 Dysarthria following cerebral infarction: Secondary | ICD-10-CM | POA: Diagnosis not present

## 2021-02-18 DIAGNOSIS — Z9049 Acquired absence of other specified parts of digestive tract: Secondary | ICD-10-CM | POA: Diagnosis not present

## 2021-02-18 DIAGNOSIS — G40909 Epilepsy, unspecified, not intractable, without status epilepticus: Secondary | ICD-10-CM | POA: Diagnosis not present

## 2021-02-18 DIAGNOSIS — E119 Type 2 diabetes mellitus without complications: Secondary | ICD-10-CM | POA: Diagnosis not present

## 2021-02-18 DIAGNOSIS — M109 Gout, unspecified: Secondary | ICD-10-CM | POA: Diagnosis not present

## 2021-02-18 DIAGNOSIS — I700 Atherosclerosis of aorta: Secondary | ICD-10-CM | POA: Diagnosis not present

## 2021-02-18 DIAGNOSIS — I10 Essential (primary) hypertension: Secondary | ICD-10-CM | POA: Diagnosis not present

## 2021-02-18 DIAGNOSIS — I4891 Unspecified atrial fibrillation: Secondary | ICD-10-CM | POA: Diagnosis not present

## 2021-02-18 DIAGNOSIS — I69328 Other speech and language deficits following cerebral infarction: Secondary | ICD-10-CM | POA: Diagnosis not present

## 2021-02-18 DIAGNOSIS — Z9089 Acquired absence of other organs: Secondary | ICD-10-CM | POA: Diagnosis not present

## 2021-02-18 DIAGNOSIS — E669 Obesity, unspecified: Secondary | ICD-10-CM | POA: Diagnosis not present

## 2021-02-18 DIAGNOSIS — D509 Iron deficiency anemia, unspecified: Secondary | ICD-10-CM | POA: Diagnosis not present

## 2021-02-18 DIAGNOSIS — Z79891 Long term (current) use of opiate analgesic: Secondary | ICD-10-CM | POA: Diagnosis not present

## 2021-02-18 DIAGNOSIS — E785 Hyperlipidemia, unspecified: Secondary | ICD-10-CM | POA: Diagnosis not present

## 2021-02-18 NOTE — Telephone Encounter (Signed)
Returned Stephanie call and provided verbal orders  

## 2021-02-19 ENCOUNTER — Ambulatory Visit (INDEPENDENT_AMBULATORY_CARE_PROVIDER_SITE_OTHER): Payer: BC Managed Care – PPO

## 2021-02-19 ENCOUNTER — Other Ambulatory Visit: Payer: Self-pay

## 2021-02-19 ENCOUNTER — Ambulatory Visit (INDEPENDENT_AMBULATORY_CARE_PROVIDER_SITE_OTHER): Payer: BC Managed Care – PPO | Admitting: Infectious Disease

## 2021-02-19 ENCOUNTER — Encounter: Payer: Self-pay | Admitting: Infectious Disease

## 2021-02-19 ENCOUNTER — Telehealth: Payer: Self-pay

## 2021-02-19 VITALS — BP 94/66 | HR 75 | Temp 99.3°F

## 2021-02-19 DIAGNOSIS — R569 Unspecified convulsions: Secondary | ICD-10-CM

## 2021-02-19 DIAGNOSIS — Z9089 Acquired absence of other organs: Secondary | ICD-10-CM | POA: Diagnosis not present

## 2021-02-19 DIAGNOSIS — D509 Iron deficiency anemia, unspecified: Secondary | ICD-10-CM | POA: Diagnosis not present

## 2021-02-19 DIAGNOSIS — I10 Essential (primary) hypertension: Secondary | ICD-10-CM | POA: Diagnosis not present

## 2021-02-19 DIAGNOSIS — E1165 Type 2 diabetes mellitus with hyperglycemia: Secondary | ICD-10-CM | POA: Diagnosis not present

## 2021-02-19 DIAGNOSIS — G8114 Spastic hemiplegia affecting left nondominant side: Secondary | ICD-10-CM

## 2021-02-19 DIAGNOSIS — I4891 Unspecified atrial fibrillation: Secondary | ICD-10-CM | POA: Diagnosis not present

## 2021-02-19 DIAGNOSIS — Z79891 Long term (current) use of opiate analgesic: Secondary | ICD-10-CM | POA: Diagnosis not present

## 2021-02-19 DIAGNOSIS — M4628 Osteomyelitis of vertebra, sacral and sacrococcygeal region: Secondary | ICD-10-CM | POA: Diagnosis not present

## 2021-02-19 DIAGNOSIS — E1159 Type 2 diabetes mellitus with other circulatory complications: Secondary | ICD-10-CM

## 2021-02-19 DIAGNOSIS — I69354 Hemiplegia and hemiparesis following cerebral infarction affecting left non-dominant side: Secondary | ICD-10-CM | POA: Diagnosis not present

## 2021-02-19 DIAGNOSIS — I69322 Dysarthria following cerebral infarction: Secondary | ICD-10-CM | POA: Diagnosis not present

## 2021-02-19 DIAGNOSIS — E119 Type 2 diabetes mellitus without complications: Secondary | ICD-10-CM | POA: Diagnosis not present

## 2021-02-19 DIAGNOSIS — G40909 Epilepsy, unspecified, not intractable, without status epilepticus: Secondary | ICD-10-CM | POA: Diagnosis not present

## 2021-02-19 DIAGNOSIS — E669 Obesity, unspecified: Secondary | ICD-10-CM | POA: Diagnosis not present

## 2021-02-19 DIAGNOSIS — M869 Osteomyelitis, unspecified: Secondary | ICD-10-CM

## 2021-02-19 DIAGNOSIS — I700 Atherosclerosis of aorta: Secondary | ICD-10-CM | POA: Diagnosis not present

## 2021-02-19 DIAGNOSIS — L89894 Pressure ulcer of other site, stage 4: Secondary | ICD-10-CM | POA: Diagnosis not present

## 2021-02-19 DIAGNOSIS — I69328 Other speech and language deficits following cerebral infarction: Secondary | ICD-10-CM

## 2021-02-19 DIAGNOSIS — I69359 Hemiplegia and hemiparesis following cerebral infarction affecting unspecified side: Secondary | ICD-10-CM

## 2021-02-19 DIAGNOSIS — L89154 Pressure ulcer of sacral region, stage 4: Secondary | ICD-10-CM | POA: Diagnosis not present

## 2021-02-19 DIAGNOSIS — Z23 Encounter for immunization: Secondary | ICD-10-CM

## 2021-02-19 DIAGNOSIS — E785 Hyperlipidemia, unspecified: Secondary | ICD-10-CM | POA: Diagnosis not present

## 2021-02-19 DIAGNOSIS — Z9049 Acquired absence of other specified parts of digestive tract: Secondary | ICD-10-CM | POA: Diagnosis not present

## 2021-02-19 DIAGNOSIS — M109 Gout, unspecified: Secondary | ICD-10-CM | POA: Diagnosis not present

## 2021-02-19 HISTORY — DX: Osteomyelitis of vertebra, sacral and sacrococcygeal region: M46.28

## 2021-02-19 HISTORY — DX: Osteomyelitis, unspecified: M86.9

## 2021-02-19 NOTE — Telephone Encounter (Signed)
Per [facility] line is scheduled for 02/21/21 @ 9am and short stay scheduled for the same day @ 10am. Faxed orders to short stay confirmation received. Pt is aware of appt day and time. Updated [facility] with antibiotic orders. Carolynn Sayers with [facility] is aware of picc appt.

## 2021-02-19 NOTE — Progress Notes (Addendum)
Subjective:  Reason for infectious disease consult: Osteomyelitis of the coccyx Requesting physician: Kalman Shan, DO   Patient ID: [name], male    DOB: 12/14/1965, 56 y.o.   MRN: [id number]  HPI  [name] is a 56 year old black man with multiple medical problems including stroke with left-sided hemiparesis language and speech deficits hypertension seizure disorder who has been followed closely by Dr. Kalman Shan in the wound clinic.  He has had more than 14-monthhistory of a sacral ulcer.  He was hospitalized in November for new onset atrial fibrillation and also with a GI bleed.  Apparently during that hospitalization ~pressure ulcer in his sacral area.  He recently had x-ray which showed some cortical erosion at the junction of the first and second coccygeal segments concerning for acute osteomyelitis in sacrococcygeal area.  Dr. HHeber Carolinadid not find this was an area that she could biopsy for culture.  Patient was referred to [place] consideration of empiric antibiotics to treat his osteomyelitis in his coccyx.  He does have an air mattress at home and he and his wife who accompanies him to clinic are trying to the best they can to offload the area.  They say there is no purulent drainage and he is without fevers chills nausea malaise or other systemic symptoms to suggest systemic infection.  He does not recall having a history of MRSA infection though we will certainly want to cover for MRSA and MR SE.      Past Medical History:  Diagnosis Date   Diabetes mellitus without complication ([place]    Gait abnormality 03/31/2018   Hemiparesis and speech and language deficit as late effects of stroke ([place] 03/31/2018   Hypertension    Rectal cancer ([place] 2009   rectal   Sacral osteomyelitis ([place] 02/19/2021   Seizure ([place]    Stroke ([place] 2018    Past Surgical History:  Procedure Laterality Date   APPENDECTOMY     BIOPSY  11/18/2020   Procedure: BIOPSY;  Surgeon:  DSharyn Creamer MD;  [facility];  Service: Gastroenterology;;   COLONOSCOPY N/A 11/18/2020   Procedure: COLONOSCOPY;  Surgeon: DSharyn Creamer MD;  [facility];  Service: Gastroenterology;  Laterality: N/A;   ESOPHAGOGASTRODUODENOSCOPY (EGD) WITH PROPOFOL N/A 11/18/2020   Procedure: ESOPHAGOGASTRODUODENOSCOPY (EGD) WITH PROPOFOL;  Surgeon: DSharyn Creamer MD;  [place]  Service: Gastroenterology;  Laterality: N/A;   JOINT REPLACEMENT Left 2004   Left hip   POLYPECTOMY  11/18/2020   Procedure: POLYPECTOMY;  Surgeon: DSharyn Creamer MD;  [facility];  Service: Gastroenterology;;   TOTAL HIP ARTHROPLASTY      Family History  Problem Relation Age of Onset   Hypertension Mother    Heart disease Mother    Hyperlipidemia Mother    Heart failure Mother    Hypertension Sister    Hypertension Brother    Stroke Father    Hypertension Father    Colon cancer Maternal Grandmother    Diabetes Maternal Grandmother    Esophageal cancer Neg Hx       Social History   Socioeconomic History   Marital status: Married    Spouse name: MSharyn [name]  Number of children: 1   Years of education: 16   Highest education level: Not on file  Occupational History   Occupation: disabled  Tobacco Use   Smoking status: Never   Smokeless tobacco: Never  Vaping Use   Vaping Use: Never used  Substance and  Sexual Activity   Alcohol use: Not Currently   Drug use: No   Sexual activity: Not Currently  Other Topics Concern   Not on file  Social History Narrative   Patient drinks caffeine a couple times a week.   Patient is right handed.   Lives with wife [name]) and daughter   Social Determinants of Health   Financial Resource Strain: Not on file  Food Insecurity: Not on file  Transportation Needs: Not on file  Physical Activity: Not on file  Stress: Not on file  Social Connections: Not on file    No Known Allergies   Current Outpatient  Medications:    amiodarone (PACERONE) 200 MG tablet, Take 1 tablet (200 mg total) by mouth daily., Disp: 30 tablet, Rfl: 0   atorvastatin (LIPITOR) 80 MG tablet, Take 1 tablet (80 mg total) by mouth daily., Disp: 90 tablet, Rfl: 1   carvedilol (COREG) 12.5 MG tablet, Take 1 tablet (12.5 mg total) by mouth 2 (two) times daily with a meal., Disp: 60 tablet, Rfl: 3   citalopram (CELEXA) 20 MG tablet, TAKE 1 TABLET BY MOUTH EVERY DAY, Disp: 90 tablet, Rfl: 1   collagenase (SANTYL) ointment, Apply topically 2 (two) times daily. To yellow slough and cover with damp to dry dressing.  Change dressing twice a day, Disp: 30 g, Rfl: 6   diltiazem (CARDIZEM) 30 MG tablet, Take 1 tablet (30 mg total) by mouth every 8 (eight) hours., Disp: 90 tablet, Rfl: 3   ferrous sulfate 325 (65 FE) MG tablet, 1 tab PO Q Mon/Wed/Fri, Disp: 100 tablet, Rfl: 1   meclizine (ANTIVERT) 12.5 MG tablet, Take 1-2 tablets (12.5-25 mg total) by mouth 3 (three) times daily as needed for dizziness., Disp: 60 tablet, Rfl: 0   morphine (MS CONTIN) 15 MG 12 hr tablet, Take 1 tablet (15 mg total) by mouth every 12 (twelve) hours., Disp: 60 tablet, Rfl: 0   oxybutynin (DITROPAN) 5 MG tablet, TAKE 1 TABLET BY MOUTH TWICE A DAY, Disp: 60 tablet, Rfl: 1   oxyCODONE (OXY IR/ROXICODONE) 5 MG immediate release tablet, Take 1-2 tablets (5-10 mg total) by mouth every 6 (six) hours as needed for severe pain., Disp: 160 tablet, Rfl: 0   pantoprazole (PROTONIX) 40 MG tablet, Take 1 tablet (40 mg total) by mouth 2 (two) times daily., Disp: 60 tablet, Rfl: 11   traZODone (DESYREL) 150 MG tablet, Take 1/2 tablet (75 mg total) by mouth at bedtime., Disp: 30 tablet, Rfl: 0    Review of Systems  Constitutional:  Negative for activity change, appetite change, chills, diaphoresis, fatigue, fever and unexpected weight change.  HENT:  Negative for congestion, rhinorrhea, sinus pressure, sneezing, sore throat and trouble swallowing.   Eyes:  Negative for  photophobia and visual disturbance.  Respiratory:  Negative for cough, chest tightness, shortness of breath, wheezing and stridor.   Cardiovascular:  Negative for chest pain, palpitations and leg swelling.  Gastrointestinal:  Negative for abdominal distention, abdominal pain, anal bleeding, blood in stool, constipation, diarrhea, nausea and vomiting.  Genitourinary:  Negative for difficulty urinating, dysuria, flank pain and hematuria.  Musculoskeletal:  Negative for arthralgias, back pain, gait problem, joint swelling and myalgias.  Skin:  Positive for wound. Negative for color change, pallor and rash.  Neurological:  Positive for weakness. Negative for dizziness, tremors and light-headedness.  Hematological:  Negative for adenopathy. Does not bruise/bleed easily.  Psychiatric/Behavioral:  Negative for agitation, behavioral problems, confusion, decreased concentration, dysphoric mood and sleep disturbance.  Objective:   Physical Exam Constitutional:      Appearance: He is well-developed.  HENT:     Head: Normocephalic and atraumatic.  Eyes:     Conjunctiva/sclera: Conjunctivae normal.  Cardiovascular:     Rate and Rhythm: Normal rate and regular rhythm.  Pulmonary:     Effort: Pulmonary effort is normal. No respiratory distress.     Breath sounds: No wheezing.  Abdominal:     General: There is no distension.     Palpations: Abdomen is soft.  Musculoskeletal:        General: No tenderness. Normal range of motion.     Cervical back: Normal range of motion and neck supple.  Skin:    General: Skin is warm and dry.     Coloration: Skin is not pale.     Findings: No erythema or rash.  Neurological:     Mental Status: He is alert and oriented to person, place, and time.     Comments: Left-sided hemiparesis  Psychiatric:        Mood and Affect: Mood normal.        Behavior: Behavior normal.        Thought Content: Thought content normal.        Judgment: Judgment normal.    Wound February 19, 2021:         Assessment & Plan:   Osteomyelitis of the coccyx underlying sacral decubitus ulcer:  We have no culture data.  I will check sed rate CRP CMP and CBC with differential  I think if his insurance will cover it and is not a high co-pay better regimen of 1 g of ertapenem daily along with daptomycin dosed by pharmacy for 6 weeks would be a reasonable regimen that would cover likely and even some unlikely pathogens such as ESBL  Diagnosis: Osteomyelitis of coccyx Culture Result: No culture data  No Known Allergies  OPAT Orders Discharge antibiotics to be given via PICC line Discharge antibiotics: Ertapenem 1 g daily  Daptomycin dosed per pharmacy will start with 165m daily  Duration: 6 weeks End Date:  42 days from date of home antibiotic set up  [place] Protocol:  Home health RN for IV administration and teaching; PICC line care and labs.    Labs weekly while on IV antibiotics: _x_ CBC with differential _x_ BMP _ _x_ CRP _x_ ESR  _x_ CK  _x_ Please pull PIC at completion of IV antibiotics __ Please leave PIC in place until doctor has seen patient or been notified  Fax weekly labs to ([phone] Clinic Follow Up Appt:   KSAKETH DAUBERThas an appointment on 03/25/2021 at 945 with Dr. VTommy Medal [facility]  [address] [place] [place]  [address], which is located to the left of the elevators.  Phone: [phone] Fax: [phone] [email or web address]  Vaccine counseling: Recommend he get updated COVID-19 booster if he has not had it yet  I spent 82 minutes with the patient including than 50% of the time in face to face counseling of the patient and his wife regarding the nature of osteomyelitis potential culprit pathogens that could be involved with the management which consists of IV antibiotics  versus oral antibiotics along with offloading of the area and optimization of nutrition is diabetes mellitus hypertension, reviewing the risks and benefits to IV therapy PICC line therapy and of  different antibiotics being considered personally reviewing plain films of his pelvis showing the coccygeal osteomyelitis on February 08, 2021 along with review of medical records in preparation for the visit and during the visit and in coordination of his care.

## 2021-02-19 NOTE — Progress Notes (Signed)
° °  Covid-19 Vaccination Clinic  Name:  [name]    MRN: [id number] DOB: January 24, 1965  02/19/2021  [name] was observed post Covid-19 immunization for 15 minutes without incident. He was provided with Vaccine Information Sheet and instruction to access the V-Safe system.   [name] was instructed to call 911 with any severe reactions post vaccine: Difficulty breathing  Swelling of face and throat  A fast heartbeat  A bad rash all over body  Dizziness and weakness   Immunizations Administered     Name Date Dose VIS Date Route   Pfizer Covid-19 Vaccine Bivalent Booster 02/19/2021 12:29 PM 0.3 mL 09/12/2020 Intramuscular   Manufacturer: [place]   Lot: [id number]   [place]: [phone]      Adelfa Koh, CMA

## 2021-02-20 ENCOUNTER — Other Ambulatory Visit (HOSPITAL_COMMUNITY): Payer: Self-pay | Admitting: *Deleted

## 2021-02-20 ENCOUNTER — Telehealth: Payer: Self-pay | Admitting: Internal Medicine

## 2021-02-20 DIAGNOSIS — I69328 Other speech and language deficits following cerebral infarction: Secondary | ICD-10-CM | POA: Diagnosis not present

## 2021-02-20 DIAGNOSIS — D509 Iron deficiency anemia, unspecified: Secondary | ICD-10-CM | POA: Diagnosis not present

## 2021-02-20 DIAGNOSIS — L89894 Pressure ulcer of other site, stage 4: Secondary | ICD-10-CM | POA: Diagnosis not present

## 2021-02-20 DIAGNOSIS — Z9089 Acquired absence of other organs: Secondary | ICD-10-CM | POA: Diagnosis not present

## 2021-02-20 DIAGNOSIS — Z79891 Long term (current) use of opiate analgesic: Secondary | ICD-10-CM | POA: Diagnosis not present

## 2021-02-20 DIAGNOSIS — Z9049 Acquired absence of other specified parts of digestive tract: Secondary | ICD-10-CM | POA: Diagnosis not present

## 2021-02-20 DIAGNOSIS — I69322 Dysarthria following cerebral infarction: Secondary | ICD-10-CM | POA: Diagnosis not present

## 2021-02-20 DIAGNOSIS — E119 Type 2 diabetes mellitus without complications: Secondary | ICD-10-CM | POA: Diagnosis not present

## 2021-02-20 DIAGNOSIS — I10 Essential (primary) hypertension: Secondary | ICD-10-CM | POA: Diagnosis not present

## 2021-02-20 DIAGNOSIS — E669 Obesity, unspecified: Secondary | ICD-10-CM | POA: Diagnosis not present

## 2021-02-20 DIAGNOSIS — M109 Gout, unspecified: Secondary | ICD-10-CM | POA: Diagnosis not present

## 2021-02-20 DIAGNOSIS — E785 Hyperlipidemia, unspecified: Secondary | ICD-10-CM | POA: Diagnosis not present

## 2021-02-20 DIAGNOSIS — I700 Atherosclerosis of aorta: Secondary | ICD-10-CM | POA: Diagnosis not present

## 2021-02-20 DIAGNOSIS — I4891 Unspecified atrial fibrillation: Secondary | ICD-10-CM | POA: Diagnosis not present

## 2021-02-20 DIAGNOSIS — I69354 Hemiplegia and hemiparesis following cerebral infarction affecting left non-dominant side: Secondary | ICD-10-CM | POA: Diagnosis not present

## 2021-02-20 DIAGNOSIS — G40909 Epilepsy, unspecified, not intractable, without status epilepticus: Secondary | ICD-10-CM | POA: Diagnosis not present

## 2021-02-20 LAB — CBC WITH DIFFERENTIAL/PLATELET
Absolute Monocytes: 320 cells/uL (ref 200–950)
Basophils Absolute: 20 cells/uL (ref 0–200)
Basophils Relative: 0.5 %
Eosinophils Absolute: 92 cells/uL (ref 15–500)
Eosinophils Relative: 2.3 %
HCT: 34.6 % — ABNORMAL LOW (ref 38.5–50.0)
Hemoglobin: 10.9 g/dL — ABNORMAL LOW (ref 13.2–17.1)
Lymphs Abs: 1044 cells/uL (ref 850–3900)
MCH: 24.8 pg — ABNORMAL LOW (ref 27.0–33.0)
MCHC: 31.5 g/dL — ABNORMAL LOW (ref 32.0–36.0)
MCV: 78.6 fL — ABNORMAL LOW (ref 80.0–100.0)
MPV: 11.1 fL (ref 7.5–12.5)
Monocytes Relative: 8 %
Neutro Abs: 2524 cells/uL (ref 1500–7800)
Neutrophils Relative %: 63.1 %
Platelets: 287 10*3/uL (ref 140–400)
RBC: 4.4 10*6/uL (ref 4.20–5.80)
RDW: 17 % — ABNORMAL HIGH (ref 11.0–15.0)
Total Lymphocyte: 26.1 %
WBC: 4 10*3/uL (ref 3.8–10.8)

## 2021-02-20 LAB — COMPLETE METABOLIC PANEL WITH GFR
AG Ratio: 1.2 (calc) (ref 1.0–2.5)
ALT: 25 U/L (ref 9–46)
AST: 19 U/L (ref 10–35)
Albumin: 3.8 g/dL (ref 3.6–5.1)
Alkaline phosphatase (APISO): 89 U/L (ref 35–144)
BUN/Creatinine Ratio: 20 (calc) (ref 6–22)
BUN: 29 mg/dL — ABNORMAL HIGH (ref 7–25)
CO2: 26 mmol/L (ref 20–32)
Calcium: 9.2 mg/dL (ref 8.6–10.3)
Chloride: 106 mmol/L (ref 98–110)
Creat: 1.44 mg/dL — ABNORMAL HIGH (ref 0.70–1.30)
Globulin: 3.1 g/dL (calc) (ref 1.9–3.7)
Glucose, Bld: 112 mg/dL — ABNORMAL HIGH (ref 65–99)
Potassium: 5.2 mmol/L (ref 3.5–5.3)
Sodium: 140 mmol/L (ref 135–146)
Total Bilirubin: 0.4 mg/dL (ref 0.2–1.2)
Total Protein: 6.9 g/dL (ref 6.1–8.1)
eGFR: 58 mL/min/{1.73_m2} — ABNORMAL LOW (ref >=60)

## 2021-02-20 LAB — C-REACTIVE PROTEIN: CRP: 5.1 mg/L (ref <8.0)

## 2021-02-20 LAB — SEDIMENTATION RATE: Sed Rate: 41 mm/h — ABNORMAL HIGH (ref 0–20)

## 2021-02-20 NOTE — Telephone Encounter (Signed)
Patients wife called in says was told patient has an infection in the buttocks and he is about to have surgery, so she has questions about the infection. Please call back

## 2021-02-20 NOTE — Telephone Encounter (Signed)
Returned pt wife and lvm making her aware that a referral to the wound clinic was placed on 02/15/21 and if she has any questions she can follow up with Infectious Disease

## 2021-02-20 NOTE — Telephone Encounter (Signed)
Received call today from Short stay regarding order clarification on Invanz dose. Short stay is not sure if order says 1000 mg or 1800 mg. Relayed correct dose.  Leatrice Jewels, RMA

## 2021-02-20 NOTE — Telephone Encounter (Signed)
Thank you :)

## 2021-02-21 ENCOUNTER — Ambulatory Visit: Payer: BC Managed Care – PPO | Admitting: Internal Medicine

## 2021-02-21 ENCOUNTER — Encounter (HOSPITAL_BASED_OUTPATIENT_CLINIC_OR_DEPARTMENT_OTHER): Payer: BC Managed Care – PPO | Admitting: Internal Medicine

## 2021-02-21 ENCOUNTER — Inpatient Hospital Stay (HOSPITAL_COMMUNITY): Admission: RE | Admit: 2021-02-21 | Payer: BC Managed Care – PPO | Source: Ambulatory Visit

## 2021-02-21 ENCOUNTER — Ambulatory Visit (HOSPITAL_COMMUNITY): Admission: RE | Admit: 2021-02-21 | Payer: BC Managed Care – PPO | Source: Ambulatory Visit

## 2021-02-22 ENCOUNTER — Telehealth: Payer: Self-pay

## 2021-02-22 NOTE — Telephone Encounter (Signed)
[facility] received noticed from [facility] that patient wife has planned to hold off on antibiotic plan from last weeks visit with Dr. Tommy Medal. Wife canceled PICC line placement appointment as well until she knows for sure that patient indeed needs antibiotic and has information regarding the xrays.   [facility] staff has made Dr. Tommy Medal aware and advises patient to refer back to wound care team if they do not wish to proceed with IV antibiotics for sacral osteomyelitis.   Patient was planned to to start Ertapenem and Daptomycin for 6 weeks.   [facility] has also spoke with patient and made him aware, but patient prefers staff speak to his wife regarding plan of care. Left wife a voicemail to return office call.  Eugenia Mcalpine

## 2021-02-24 ENCOUNTER — Other Ambulatory Visit: Payer: Self-pay | Admitting: Physical Medicine and Rehabilitation

## 2021-02-24 ENCOUNTER — Other Ambulatory Visit: Payer: Self-pay | Admitting: Nurse Practitioner

## 2021-02-24 ENCOUNTER — Other Ambulatory Visit: Payer: Self-pay | Admitting: Internal Medicine

## 2021-02-24 DIAGNOSIS — I480 Paroxysmal atrial fibrillation: Secondary | ICD-10-CM

## 2021-02-24 DIAGNOSIS — R42 Dizziness and giddiness: Secondary | ICD-10-CM

## 2021-02-25 DIAGNOSIS — D509 Iron deficiency anemia, unspecified: Secondary | ICD-10-CM | POA: Diagnosis not present

## 2021-02-25 DIAGNOSIS — I69322 Dysarthria following cerebral infarction: Secondary | ICD-10-CM | POA: Diagnosis not present

## 2021-02-25 DIAGNOSIS — I700 Atherosclerosis of aorta: Secondary | ICD-10-CM | POA: Diagnosis not present

## 2021-02-25 DIAGNOSIS — L89894 Pressure ulcer of other site, stage 4: Secondary | ICD-10-CM | POA: Diagnosis not present

## 2021-02-25 DIAGNOSIS — Z79891 Long term (current) use of opiate analgesic: Secondary | ICD-10-CM | POA: Diagnosis not present

## 2021-02-25 DIAGNOSIS — I10 Essential (primary) hypertension: Secondary | ICD-10-CM | POA: Diagnosis not present

## 2021-02-25 DIAGNOSIS — M109 Gout, unspecified: Secondary | ICD-10-CM | POA: Diagnosis not present

## 2021-02-25 DIAGNOSIS — Z9049 Acquired absence of other specified parts of digestive tract: Secondary | ICD-10-CM | POA: Diagnosis not present

## 2021-02-25 DIAGNOSIS — I4891 Unspecified atrial fibrillation: Secondary | ICD-10-CM | POA: Diagnosis not present

## 2021-02-25 DIAGNOSIS — Z9089 Acquired absence of other organs: Secondary | ICD-10-CM | POA: Diagnosis not present

## 2021-02-25 DIAGNOSIS — E669 Obesity, unspecified: Secondary | ICD-10-CM | POA: Diagnosis not present

## 2021-02-25 DIAGNOSIS — E785 Hyperlipidemia, unspecified: Secondary | ICD-10-CM | POA: Diagnosis not present

## 2021-02-25 DIAGNOSIS — I69354 Hemiplegia and hemiparesis following cerebral infarction affecting left non-dominant side: Secondary | ICD-10-CM | POA: Diagnosis not present

## 2021-02-25 DIAGNOSIS — E119 Type 2 diabetes mellitus without complications: Secondary | ICD-10-CM | POA: Diagnosis not present

## 2021-02-25 DIAGNOSIS — G40909 Epilepsy, unspecified, not intractable, without status epilepticus: Secondary | ICD-10-CM | POA: Diagnosis not present

## 2021-02-25 DIAGNOSIS — I69328 Other speech and language deficits following cerebral infarction: Secondary | ICD-10-CM | POA: Diagnosis not present

## 2021-02-25 NOTE — Telephone Encounter (Signed)
Requested medications are due for refill today.  yes  Requested medications are on the active medications list.  yes  Last refill. 02/05/2021 #60 0 refills  Future visit scheduled.   yes  Notes to clinic.  Medication not delegated.    Requested Prescriptions  Pending Prescriptions Disp Refills   meclizine (ANTIVERT) 12.5 MG tablet [Pharmacy Med Name: MECLIZINE 12.5 MG TABLET] 60 tablet 0    Sig: Take 1-2 tablets (12.5-25 mg total) by mouth 3 (three) times daily as needed for dizziness.     Not Delegated - Gastroenterology: Antiemetics Failed - 02/24/2021  7:19 PM      Failed - This refill cannot be delegated      Passed - Valid encounter within last 6 months    Recent Outpatient Visits           2 weeks ago [place], Vernia Buff, NP   1 month ago [place], Deborah B, MD   2 months ago Hospital discharge follow-up   [place] Ladell Pier, MD   4 months ago Pain of right upper extremity   [place], Deborah B, MD   4 months ago Controlled type 2 diabetes mellitus with other circulatory complication, without long-term current use of insulin [facility])   [place], Deborah B, MD       Future Appointments             In 4 weeks Tommy Medal, Lavell Islam, MD [facility] for Infectious Disease, [facility]   In 2 months Ladell Pier, MD [place]

## 2021-02-25 NOTE — Telephone Encounter (Signed)
Requested medications are due for refill today.  Amiodarone - yes, Cardizem  - no  Requested medications are on the active medications list.  Yes both  Last refill. Amiodarone 01/16/2021 #30 /0 refills, Cardizem  01/10/2021 #90/3 refills  Future visit scheduled.   yes  Notes to clinic.  Amiodarone  - not delegated.  Cardizem pt should have enough to last until 05/11/2021    Requested Prescriptions  Pending Prescriptions Disp Refills   amiodarone (PACERONE) 200 MG tablet [Pharmacy Med Name: AMIODARONE HCL 200 MG TABLET] 30 tablet 0    Sig: TAKE 1 TABLET BY MOUTH EVERY DAY     Not Delegated - Cardiovascular: Antiarrhythmic Agents - amiodarone Failed - 02/24/2021  7:19 PM      Failed - This refill cannot be delegated      Failed - Manual Review: Eye exam recommended every 12 months      Failed - Patient had chest x-ray within the last 6 months      Passed - TSH in normal range and within 360 days    TSH  Date Value Ref Range Status  11/14/2020 1.076 0.350 - 4.500 uIU/mL Final    Comment:    Performed by a 3rd Generation assay with a functional sensitivity of <=0.01 uIU/mL. Performed at [facility], [place] [address], [place], [place] [address]           Passed - Mg Level in normal range and within 360 days    Magnesium  Date Value Ref Range Status  11/17/2020 2.4 1.7 - 2.4 mg/dL Final    Comment:    Performed at [facility], [place] [address], [place], [place] [address]          Passed - K in normal range and within 180 days    Potassium  Date Value Ref Range Status  02/19/2021 5.2 3.5 - 5.3 mmol/L Final          Passed - AST in normal range and within 180 days    AST  Date Value Ref Range Status  02/19/2021 19 10 - 35 U/L Final          Passed - ALT in normal range and within 180 days    ALT  Date Value Ref Range Status  02/19/2021 25 9 - 46 U/L Final          Passed - Patient had ECG in the last 180 days      Passed - Patient is not pregnant       Passed - Last BP in normal range    BP Readings from Last 1 Encounters:  02/19/21 94/66          Passed - Last Heart Rate in normal range    Pulse Readings from Last 1 Encounters:  02/19/21 75          Passed - Valid encounter within last 6 months    Recent Outpatient Visits           2 weeks ago [place], Vernia Buff, NP   1 month ago [place] Ladell Pier, MD   2 months ago Hospital discharge follow-up   [place] Ladell Pier, MD   4 months ago Pain of right upper extremity   [place] Ladell Pier, MD   4 months  ago Controlled type 2 diabetes mellitus with other circulatory complication, without long-term current use of insulin ([place])   [place], MD       Future Appointments             In 4 weeks Tommy Medal, Lavell Islam, MD [facility] for Infectious Disease, [facility]   In 2 months Ladell Pier, MD [place]             diltiazem (CARDIZEM) 30 MG tablet [Pharmacy Med Name: DILTIAZEM 30 MG TABLET] 360 tablet 1    Sig: TAKE 1 TABLET (30 MG TOTAL) BY MOUTH EVERY 6 HOURS     Cardiovascular: Calcium Channel Blockers 3 Failed - 02/24/2021  7:19 PM      Failed - Cr in normal range and within 360 days    Creat  Date Value Ref Range Status  02/19/2021 1.44 (H) 0.70 - 1.30 mg/dL Final          Passed - ALT in normal range and within 360 days    ALT  Date Value Ref Range Status  02/19/2021 25 9 - 46 U/L Final          Passed - AST in normal range and within 360 days    AST  Date Value Ref Range Status  02/19/2021 19 10 - 35 U/L Final          Passed - Last BP in normal range    BP Readings from Last 1 Encounters:  02/19/21 94/66          Passed - Last Heart Rate in normal range     Pulse Readings from Last 1 Encounters:  02/19/21 75          Passed - Valid encounter within last 6 months    Recent Outpatient Visits           2 weeks ago [place], Vernia Buff, NP   1 month ago [place], Deborah B, MD   2 months ago Hospital discharge follow-up   [place], Deborah B, MD   4 months ago Pain of right upper extremity   [place], Deborah B, MD   4 months ago Controlled type 2 diabetes mellitus with other circulatory complication, without long-term current use of insulin [facility])   [place], Deborah B, MD       Future Appointments             In 4 weeks Tommy Medal, Lavell Islam, MD [facility] for Infectious Disease, [facility]   In 2 months Ladell Pier, MD [place]

## 2021-02-27 NOTE — Telephone Encounter (Signed)
Follow up call placed to patient's wife and was able to connect with [name]. She verbalized that having PICC placed is a very invasive procedure and she would like to discuss xray results with wound care team tomorrow during appointment and will follow up with ID if they prefer IV or oral antibiotics at that time.  Staff advised family of risks delaying therapy as recommended by ID team Family verbalized understanding and will discuss xray results with wound care team during visit scheduled for 2/15. Routing to wound care and ID provider to also make aware.  Eugenia Mcalpine

## 2021-02-28 ENCOUNTER — Encounter (HOSPITAL_BASED_OUTPATIENT_CLINIC_OR_DEPARTMENT_OTHER): Payer: BC Managed Care – PPO | Admitting: Internal Medicine

## 2021-02-28 ENCOUNTER — Other Ambulatory Visit: Payer: Self-pay | Admitting: Infectious Disease

## 2021-02-28 ENCOUNTER — Other Ambulatory Visit: Payer: Self-pay

## 2021-02-28 DIAGNOSIS — Z993 Dependence on wheelchair: Secondary | ICD-10-CM | POA: Diagnosis not present

## 2021-02-28 DIAGNOSIS — I69398 Other sequelae of cerebral infarction: Secondary | ICD-10-CM | POA: Diagnosis not present

## 2021-02-28 DIAGNOSIS — I69354 Hemiplegia and hemiparesis following cerebral infarction affecting left non-dominant side: Secondary | ICD-10-CM | POA: Diagnosis not present

## 2021-02-28 DIAGNOSIS — Z9181 History of falling: Secondary | ICD-10-CM | POA: Diagnosis not present

## 2021-02-28 DIAGNOSIS — G40909 Epilepsy, unspecified, not intractable, without status epilepticus: Secondary | ICD-10-CM | POA: Diagnosis not present

## 2021-02-28 DIAGNOSIS — I129 Hypertensive chronic kidney disease with stage 1 through stage 4 chronic kidney disease, or unspecified chronic kidney disease: Secondary | ICD-10-CM | POA: Diagnosis not present

## 2021-02-28 DIAGNOSIS — L89154 Pressure ulcer of sacral region, stage 4: Secondary | ICD-10-CM | POA: Diagnosis not present

## 2021-02-28 DIAGNOSIS — I69359 Hemiplegia and hemiparesis following cerebral infarction affecting unspecified side: Secondary | ICD-10-CM | POA: Diagnosis not present

## 2021-02-28 DIAGNOSIS — M4628 Osteomyelitis of vertebra, sacral and sacrococcygeal region: Secondary | ICD-10-CM | POA: Diagnosis not present

## 2021-02-28 DIAGNOSIS — N182 Chronic kidney disease, stage 2 (mild): Secondary | ICD-10-CM | POA: Diagnosis not present

## 2021-02-28 DIAGNOSIS — I4891 Unspecified atrial fibrillation: Secondary | ICD-10-CM | POA: Diagnosis not present

## 2021-02-28 DIAGNOSIS — L89153 Pressure ulcer of sacral region, stage 3: Secondary | ICD-10-CM | POA: Diagnosis not present

## 2021-02-28 DIAGNOSIS — E1165 Type 2 diabetes mellitus with hyperglycemia: Secondary | ICD-10-CM | POA: Diagnosis not present

## 2021-02-28 MED ORDER — AMOXICILLIN-POT CLAVULANATE 875-125 MG PO TABS: 1.0000 | ORAL_TABLET | Freq: Two times a day (BID) | ORAL | 0 refills | Status: AC

## 2021-02-28 MED ORDER — DOXYCYCLINE HYCLATE 100 MG PO TABS: 100.0000 mg | ORAL_TABLET | Freq: Two times a day (BID) | ORAL | 0 refills | Status: DC

## 2021-02-28 NOTE — Telephone Encounter (Signed)
Follow up call placed to [name] (wife) after wound care visit. Wife and patient has decided on oral antibiotic therapy at this time. Routing message to Dr. Tommy Medal to make aware to place orders to [facility] on [facility] pharmacy.  Eugenia Mcalpine

## 2021-02-28 NOTE — Progress Notes (Signed)
DEADRIAN, TOYA ([id number]) Visit Report for 02/28/2021 Arrival Information Details Patient Name: Date of Service: [name] [place] [name]. 02/28/2021 8:15 A M Medical Record Number: [id number] Patient Account Number: [id number] Date of Birth/Sex: Treating RN: 1965-02-19 (56 y.o. [name] Primary Care Cinderella Christoffersen: Karle Plumber Other Clinician: Referring Roma Bierlein: Treating Briceida Rasberry/Extender: Sammuel Bailiff in Treatment: 5 Visit Information History Since Last Visit Added or deleted any medications: No Patient Arrived: Wheel Chair Any new allergies or adverse reactions: No Arrival Time: 08:28 Had a fall or experienced change in No Accompanied By: family member activities of daily living that may affect Transfer Assistance: Manual risk of falls: Patient Identification Verified: Yes Signs or symptoms of abuse/neglect since last visito No Secondary Verification Process Completed: Yes Hospitalized since last visit: No Patient Requires Transmission-Based Precautions: No Implantable device outside of the clinic excluding No Patient Has Alerts: No cellular tissue based products placed in the center since last visit: Has Dressing in Place as Prescribed: Yes Pain Present Now: No Electronic Signature(s) Signed: 02/28/2021 4:02:17 PM By: Deon Pilling RN, BSN Entered By: Deon Pilling on 02/28/2021 08:32:21 -------------------------------------------------------------------------------- Encounter Discharge Information Details Patient Name: Date of Service: [name], [place] [name]. 02/28/2021 8:15 A M Medical Record Number: [id number] Patient Account Number: [id number] Date of Birth/Sex: Treating RN: [birth year] (56 y.o. [name] Primary Care Blandon Offerdahl: Karle Plumber Other Clinician: Referring Orlanda Frankum: Treating Tywon Niday/Extender: Sammuel Bailiff in Treatment: 5 Encounter Discharge Information Items Discharge Condition:  Stable Ambulatory Status: Wheelchair Discharge Destination: Home Transportation: Private Auto Accompanied By: family Schedule Follow-up Appointment: Yes Clinical Summary of Care: Provided on 02/28/2021 Form Type Recipient Paper Patient Patient Electronic Signature(s) Signed: 02/28/2021 5:10:40 PM By: Lorrin Jackson Entered By: Lorrin Jackson on 02/28/2021 09:04:14 -------------------------------------------------------------------------------- Lower Extremity Assessment Details Patient Name: Date of Service: [name] [place] [name]. 02/28/2021 8:15 A M Medical Record Number: [id number] Patient Account Number: [id number] Date of Birth/Sex: Treating RN: Apr 25, 1965 (56 y.o. [name] Primary Care Kolbi Tofte: Karle Plumber Other Clinician: Referring Erskine Steinfeldt: Treating Maverick Patman/Extender: Sammuel Bailiff in Treatment: 5 Electronic Signature(s) Signed: 02/28/2021 4:02:17 PM By: Deon Pilling RN, BSN Entered By: Deon Pilling on 02/28/2021 08:33:41 -------------------------------------------------------------------------------- Multi Wound Chart Details Patient Name: Date of Service: [name], Mark Copas [place] [name]. 02/28/2021 8:15 A M Medical Record Number: [id number] Patient Account Number: [id number] Date of Birth/Sex: Treating RN: 1965/02/26 (56 y.o. M) Primary Care Kazuko Clemence: Karle Plumber Other Clinician: Referring Zylon Creamer: Treating Zaharah Amir/Extender: Sammuel Bailiff in Treatment: 5 Vital Signs Height(in): 64 Pulse(bpm): 58 Weight(lbs): Blood Pressure(mmHg): 154/94 Body Mass Index(BMI): Temperature(F): 98.4 Respiratory Rate(breaths/min): 20 Photos: [N/A:N/A] Sacrum N/A N/A Wound Location: Pressure Injury N/A N/A Wounding Event: Pressure Ulcer N/A N/A Primary Etiology: Hypertension, Type II Diabetes, N/A N/A Comorbid History: Paraplegia, Seizure Disorder 11/13/2020 N/A N/A Date Acquired: 5 N/A N/A Weeks of  Treatment: Open N/A N/A Wound Status: No N/A N/A Wound Recurrence: 2x2x1 N/A N/A Measurements L x W x [name] (cm) 3.142 N/A N/A A (cm) : rea 3.142 N/A N/A Volume (cm) : 54.30% N/A N/A % Reduction in A rea: 71.40% N/A N/A % Reduction in Volume: 12 Starting Position 1 (o'clock): 3 Ending Position 1 (o'clock): 1.2 Maximum Distance 1 (cm): Yes N/A N/A Undermining: Category/Stage IV N/A N/A Classification: Medium N/A N/A Exudate A mount: Serosanguineous N/A N/A Exudate Type: red, brown N/A N/A Exudate Color: Distinct, outline attached N/A N/A Wound Margin: Large (67-100%) N/A N/A Granulation Amount: Red, Pink, Hyper-granulation N/A  N/A Granulation Quality: None Present (0%) N/A N/A Necrotic Amount: Fat Layer (Subcutaneous Tissue): Yes N/A N/A Exposed Structures: Bone: Yes Fascia: No Tendon: No Muscle: No Joint: No Medium (34-66%) N/A N/A Epithelialization: Chemical Cauterization N/A N/A Procedures Performed: Treatment Notes Wound #1 (Sacrum) Cleanser Wound Cleanser Discharge Instruction: Cleanse the wound with wound cleanser prior to applying a clean dressing using gauze sponges, not tissue or cotton balls. Peri-Wound Care Skin Prep Discharge Instruction: Use skin prep as directed Topical Primary Dressing Dakin's Solution moisten gauze Discharge Instruction: Dakin's moisten gauze directly to undermining and wound bed. Secondary Dressing Woven Gauze Sponges 2x2 in Discharge Instruction: Apply over primary dressing as directed. Zetuvit Plus Silicone Border Dressing 4x4 (in/in) Discharge Instruction: Apply silicone border over primary dressing as directed. Secured With Compression Wrap Compression Stockings Add-Ons Electronic Signature(s) Signed: 02/28/2021 9:21:42 AM By: Kalman Shan DO Entered By: Kalman Shan on 02/28/2021 09:14:54 -------------------------------------------------------------------------------- Multi-Disciplinary Care Plan  Details Patient Name: Date of Service: [name], Mark Copas [place] [name]. 02/28/2021 8:15 A M Medical Record Number: [id number] Patient Account Number: [id number] Date of Birth/Sex: Treating RN: 03-28-1965 (56 y.o. [name] Primary Care Damonica Chopra: Karle Plumber Other Clinician: Referring Jaicion Laurie: Treating Jun Osment/Extender: Sammuel Bailiff in Treatment: 5 Active Inactive Abuse / Safety / Falls / Self Care Management Nursing Diagnoses: Potential for falls Potential for injury related to falls Goals: Patient will remain injury free related to falls Date Initiated: 01/18/2021 Target Resolution Date: 03/07/2021 Goal Status: Active Patient/caregiver will verbalize/demonstrate measure taken to improve self care Date Initiated: 01/18/2021 Target Resolution Date: 03/07/2021 Goal Status: Active Interventions: Assess: immobility, friction, shearing, incontinence upon admission and as needed Assess self care needs on admission and as needed Notes: Pressure Nursing Diagnoses: Knowledge deficit related to causes and risk factors for pressure ulcer development Potential for impaired tissue integrity related to pressure, friction, moisture, and shear Goals: Patient will remain free from development of additional pressure ulcers Date Initiated: 01/18/2021 Target Resolution Date: 03/22/2021 Goal Status: Active Patient/caregiver will verbalize risk factors for pressure ulcer development Date Initiated: 01/18/2021 Target Resolution Date: 03/22/2021 Goal Status: Active Interventions: Assess: immobility, friction, shearing, incontinence upon admission and as needed Assess offloading mechanisms upon admission and as needed Assess potential for pressure ulcer upon admission and as needed Provide education on pressure ulcers Treatment Activities: Patient referred for home evaluation of offloading devices/mattresses : 01/18/2021 Patient referred for pressure reduction/relief devices  : 01/18/2021 Pressure reduction/relief device ordered : 01/18/2021 Notes: Wound/Skin Impairment Nursing Diagnoses: Knowledge deficit related to ulceration/compromised skin integrity Goals: Patient/caregiver will verbalize understanding of skin care regimen Date Initiated: 01/18/2021 Target Resolution Date: 03/07/2021 Goal Status: Active Interventions: Assess patient/caregiver ability to perform ulcer/skin care regimen upon admission and as needed Assess ulceration(s) every visit Provide education on ulcer and skin care Treatment Activities: Skin care regimen initiated : 01/18/2021 Topical wound management initiated : 01/18/2021 Notes: Electronic Signature(s) Signed: 02/28/2021 8:33:35 AM By: Lorrin Jackson Entered By: Lorrin Jackson on 02/28/2021 08:33:35 -------------------------------------------------------------------------------- Pain Assessment Details Patient Name: Date of Service: [name] [place] [name]. 02/28/2021 8:15 A M Medical Record Number: [id number] Patient Account Number: [id number] Date of Birth/Sex: Treating RN: [birth year] (56 y.o. [name] Primary Care Konnie Noffsinger: Karle Plumber Other Clinician: Referring Antionetta Ator: Treating Percival Glasheen/Extender: Sammuel Bailiff in Treatment: 5 Active Problems Location of Pain Severity and Description of Pain Patient Has Paino No Site Locations Rate the pain. Current Pain Level: 0 Pain Management and Medication Current Pain Management: Medication: No Cold Application: No  Rest: No Massage: No Activity: No T.E.N.S.: No Heat Application: No Leg drop or elevation: No Is the Current Pain Management Adequate: Adequate How does your wound impact your activities of daily livingo Sleep: No Bathing: No Appetite: No Relationship With Others: No Bladder Continence: No Emotions: No Bowel Continence: No Work: No Toileting: No Drive: No Dressing: No Hobbies: No Engineer, maintenance) Signed:  02/28/2021 4:02:17 PM By: Deon Pilling RN, BSN Entered By: Deon Pilling on 02/28/2021 08:33:36 -------------------------------------------------------------------------------- Patient/Caregiver Education Details Patient Name: Date of Service: [name] [place] [name]. 2/16/2023andnbsp8:15 [place] Record Number: [id number] Patient Account Number: [id number] Date of Birth/Gender: Treating RN: 08-05-1965 (56 y.o. [name] Primary Care Physician: Karle Plumber Other Clinician: Referring Physician: Treating Physician/Extender: Sammuel Bailiff in Treatment: 5 Education Assessment Education Provided To: Patient Education Topics Provided Pressure: Methods: Explain/Verbal, Printed Responses: State content correctly Wound/Skin Impairment: Methods: Explain/Verbal, Printed Responses: State content correctly Electronic Signature(s) Signed: 02/28/2021 5:10:40 PM By: Lorrin Jackson Entered By: Lorrin Jackson on 02/28/2021 08:33:57 -------------------------------------------------------------------------------- Wound Assessment Details Patient Name: Date of Service: [name] [place] [name]. 02/28/2021 8:15 A M Medical Record Number: [id number] Patient Account Number: [id number] Date of Birth/Sex: Treating RN: Aug 10, 1965 (56 y.o. Lorette Ang, Meta.Reding Primary Care Eldor Conaway: Karle Plumber Other Clinician: Referring Lyrika Souders: Treating Tamura Lasky/Extender: Sammuel Bailiff in Treatment: 5 Wound Status Wound Number: 1 Primary Etiology: Pressure Ulcer Wound Location: Sacrum Wound Status: Open Wounding Event: Pressure Injury Comorbid Hypertension, Type II Diabetes, Paraplegia, Seizure History: Disorder Date Acquired: 11/13/2020 Weeks Of Treatment: 5 Clustered Wound: No Photos Wound Measurements Length: (cm) 2 Width: (cm) 2 Depth: (cm) 1 Area: (cm) 3.142 Volume: (cm) 3.142 % Reduction in Area: 54.3% % Reduction in Volume:  71.4% Epithelialization: Medium (34-66%) Tunneling: No Undermining: Yes Starting Position (o'clock): 12 Ending Position (o'clock): 3 Maximum Distance: (cm) 1.2 Wound Description Classification: Category/Stage IV Wound Margin: Distinct, outline attached Exudate Amount: Medium Exudate Type: Serosanguineous Exudate Color: red, brown Foul Odor After Cleansing: No Slough/Fibrino No Wound Bed Granulation Amount: Large (67-100%) Exposed Structure Granulation Quality: Red, Pink, Hyper-granulation Fascia Exposed: No Necrotic Amount: None Present (0%) Fat Layer (Subcutaneous Tissue) Exposed: Yes Tendon Exposed: No Muscle Exposed: No Joint Exposed: No Bone Exposed: Yes Treatment Notes Wound #1 (Sacrum) Cleanser Wound Cleanser Discharge Instruction: Cleanse the wound with wound cleanser prior to applying a clean dressing using gauze sponges, not tissue or cotton balls. Peri-Wound Care Skin Prep Discharge Instruction: Use skin prep as directed Topical Primary Dressing Dakin's Solution moisten gauze Discharge Instruction: Dakin's moisten gauze directly to undermining and wound bed. Secondary Dressing Woven Gauze Sponges 2x2 in Discharge Instruction: Apply over primary dressing as directed. Zetuvit Plus Silicone Border Dressing 4x4 (in/in) Discharge Instruction: Apply silicone border over primary dressing as directed. Secured With Compression Wrap Compression Stockings Environmental education officer) Signed: 02/28/2021 4:02:17 PM By: Deon Pilling RN, BSN Entered By: Deon Pilling on 02/28/2021 08:43:13 -------------------------------------------------------------------------------- Vitals Details Patient Name: Date of Service: [name], Mark Copas [place] [name]. 02/28/2021 8:15 A M Medical Record Number: [id number] Patient Account Number: [id number] Date of Birth/Sex: Treating RN: September 03, 1965 (56 y.o. [name] Primary Care Vail Vuncannon: Karle Plumber Other Clinician: Referring  Veola Cafaro: Treating Sharvi Mooneyhan/Extender: Sammuel Bailiff in Treatment: 5 Vital Signs Time Taken: 08:28 Temperature (F): 98.4 Height (in): 64 Pulse (bpm): 78 Respiratory Rate (breaths/min): 20 Blood Pressure (mmHg): 154/94 Reference Range: 80 - 120 mg / dl Electronic Signature(s) Signed: 02/28/2021 4:02:17 PM By: Deon Pilling RN, BSN Entered By:  Deon Pilling on 02/28/2021 08:33:28

## 2021-02-28 NOTE — Telephone Encounter (Signed)
Spoke with patient, notified him that doxycycline and Augmentin have been sent to his pharmacy for him to start. Patient verbalized understanding and has no further questions.   Beryle Flock, RN

## 2021-02-28 NOTE — Progress Notes (Signed)
HARDY, HARCUM ([id number]) Visit Report for 02/28/2021 Chief Complaint Document Details Patient Name: Date of Service: [name] IN D. 02/28/2021 8:15 A M Medical Record Number: [id number] Patient Account Number: [id number] Date of Birth/Sex: Treating RN: [birth year] (56 y.o. M) Primary Care Provider: Karle Plumber Other Clinician: Referring Provider: Treating Provider/Extender: Sammuel Bailiff in Treatment: 5 Information Obtained from: Patient Chief Complaint Sacral ulcer Electronic Signature(s) Signed: 02/28/2021 9:21:42 AM By: Kalman Shan DO Entered By: Kalman Shan on 02/28/2021 09:15:18 -------------------------------------------------------------------------------- HPI Details Patient Name: Date of Service: [name], [name] 02/28/2021 8:15 A M Medical Record Number: [id number] Patient Account Number: [id number] Date of Birth/Sex: Treating RN: 1965/10/18 (56 y.o. M) Primary Care Provider: Karle Plumber Other Clinician: Referring Provider: Treating Provider/Extender: Sammuel Bailiff in Treatment: 5 History of Present Illness HPI Description: Admission 01/18/2021 [name] is a 56 year old male with a past medical history of CVA with left-sided hemiparesis, language and speech deficits, hypertension, and seizure disorder that presents the clinic for a 63-month history of sacral ulcer. He was hospitalized on 11/23/2020 for new onset A. fib and GI bleed. He states that during this hospitalization he developed a pressure ulcer to his sacrum. He reports using Santyl to the wound bed twice daily. He has a group 2 air mattress and states he has been offloading the area by staying on his side. He currently denies signs of infection. He reports improvement in wound healing over the past month. 1/19; patient presents for follow-up. His pharmacy has ordered Dakin's but is not available yet to the patient. He  has continued with Santyl daily. He has no issues or complaints today. He denies signs of infection. 1/26; patient presents for follow-up. He has been using Hydrofera Blue to the wound beds. He has no issues or complaints today. He denies signs of infection. 2/2; patient presents for follow-up. He has been using Hydrofera Blue to the wound beds. He has no issues or complaints today. He had an x-ray that showed new mild cortical erosion at the junction of the first and second coccygeal segments concerning for acute osteomyelitis. He currently denies systemic signs of infection. 2/16; patient presents for follow-up. He continues to use Dakin's wet-to-dry dressings without issues. He saw Dr. Tommy Medal on 02/19/2021. At that time he had recommended IV antibiotics for 6 weeks. Patient states that after the office visit he reconsidered doing a PICC line. He would like to try oral antibiotics instead. He currently denies signs of infection. He is trying to aggressively offload the area. Electronic Signature(s) Signed: 02/28/2021 9:21:42 AM By: Kalman Shan DO Entered By: Kalman Shan on 02/28/2021 09:17:18 -------------------------------------------------------------------------------- Chemical Cauterization Details Patient Name: Date of Service: [name] IN D. 02/28/2021 8:15 A M Medical Record Number: [id number] Patient Account Number: [id number] Date of Birth/Sex: Treating RN: 1965/07/18 (56 y.o. [name] Primary Care Provider: Karle Plumber Other Clinician: Referring Provider: Treating Provider/Extender: Sammuel Bailiff in Treatment: 5 Procedure Performed for: Wound #1 Sacrum Performed By: Physician Kalman Shan, DO Post Procedure Diagnosis Same as Pre-procedure Electronic Signature(s) Signed: 02/28/2021 9:21:42 AM By: Kalman Shan DO Signed: 02/28/2021 5:10:40 PM By: Lorrin Jackson Entered By: Lorrin Jackson on 02/28/2021  08:58:01 -------------------------------------------------------------------------------- Physical Exam Details Patient Name: Date of Service: [name] IN D. 02/28/2021 8:15 A M Medical Record Number: [id number] Patient Account Number: [id number] Date of Birth/Sex: Treating RN: [birth year] (56 y.o. M) Primary Care Provider: Karle Plumber Other Clinician: Referring  Provider: Treating Provider/Extender: Sammuel Bailiff in Treatment: 5 Constitutional respirations regular, non-labored and within target range for patient.Marland Kitchen Psychiatric pleasant and cooperative. Notes Sacral ulcer with hyper granulated tissue throughout. Tunneling at the 12 o'clock position that probes close to bone. No signs of infection. Electronic Signature(s) Signed: 02/28/2021 9:21:42 AM By: Kalman Shan DO Entered By: Kalman Shan on 02/28/2021 09:17:47 -------------------------------------------------------------------------------- Physician Orders Details Patient Name: Date of Service: [name], [name] 02/28/2021 8:15 A M Medical Record Number: [id number] Patient Account Number: [id number] Date of Birth/Sex: Treating RN: 03/04/1965 (56 y.o. [name] Primary Care Provider: Karle Plumber Other Clinician: Referring Provider: Treating Provider/Extender: Sammuel Bailiff in Treatment: 5 Verbal / Phone Orders: No Diagnosis Coding ICD-10 Coding Code Description L89.154 Pressure ulcer of sacral region, stage 4 M46.28 Osteomyelitis of vertebra, sacral and sacrococcygeal region I69.359 Hemiplegia and hemiparesis following cerebral infarction affecting unspecified side E11.65 Type 2 diabetes mellitus with hyperglycemia Follow-up Appointments ppointment in 2 weeks. - Dr. Heber  (Room 7, [facility] [place]) Return A Other: - Call infectious disease to let them know you prefer oral antibiotics instead of IV. Off-Loading Wound #1 Sacrum Low air-loss  mattress (Group 2) - continue to use. Turn and reposition every 2 hours Other: - continue to use specialty mattress. minimize in wheelchair to help aid in healing pressure wound. no pressure to sacral area to prevent worsening. Home Health Wound #1 Sacrum No change in wound care orders this week; continue Home Health for wound care. May utilize formulary equivalent dressing for wound treatment orders unless otherwise specified. Other Home Health Orders/Instructions: - [facility] Wound Treatment Wound #1 - Sacrum Cleanser: Wound Cleanser ([place]) 2 x Per Day/30 Days Discharge Instructions: Cleanse the wound with wound cleanser prior to applying a clean dressing using gauze sponges, not tissue or cotton balls. Peri-Wound Care: Skin Prep (Home Health) 2 x Per Day/30 Days Discharge Instructions: Use skin prep as directed Prim Dressing: Dakin's Solution moisten gauze ([place]) 2 x Per Day/30 Days ary Discharge Instructions: Dakin's moisten gauze directly to undermining and wound bed. Secondary Dressing: Woven Gauze Sponges 2x2 in (Home Health) 2 x Per Day/30 Days Discharge Instructions: Apply over primary dressing as directed. Secondary Dressing: Zetuvit Plus Silicone Border Dressing 4x4 (in/in) (Home Health) 2 x Per Day/30 Days Discharge Instructions: Apply silicone border over primary dressing as directed. Electronic Signature(s) Signed: 02/28/2021 9:21:42 AM By: Kalman Shan DO Entered By: Kalman Shan on 02/28/2021 09:18:03 -------------------------------------------------------------------------------- Problem List Details Patient Name: Date of Service: [name], [name] 02/28/2021 8:15 A M Medical Record Number: [id number] Patient Account Number: [id number] Date of Birth/Sex: Treating RN: 1965-05-05 (56 y.o. [name] Primary Care Provider: Karle Plumber Other Clinician: Referring Provider: Treating Provider/Extender: Sammuel Bailiff in Treatment: 5 Active Problems ICD-10 Encounter Code Description Active Date MDM Diagnosis L89.154 Pressure ulcer of sacral region, stage 4 02/14/2021 No Yes M46.28 Osteomyelitis of vertebra, sacral and sacrococcygeal region 02/14/2021 No Yes I69.359 Hemiplegia and hemiparesis following cerebral infarction affecting unspecified 01/18/2021 No Yes side E11.65 Type 2 diabetes mellitus with hyperglycemia 01/18/2021 No Yes Inactive Problems Resolved Problems Electronic Signature(s) Signed: 02/28/2021 9:21:42 AM By: Kalman Shan DO Previous Signature: 02/28/2021 8:32:27 AM Version By: Lorrin Jackson Entered By: Kalman Shan on 02/28/2021 09:14:31 -------------------------------------------------------------------------------- Progress Note Details Patient Name: Date of Service: [name], [name] 02/28/2021 8:15 A M Medical Record Number: [id number] Patient Account Number: [id number] Date of Birth/Sex: Treating RN: 12/31/1965 (56 y.o.  M) Primary Care Provider: Karle Plumber Other Clinician: Referring Provider: Treating Provider/Extender: Sammuel Bailiff in Treatment: 5 Subjective Chief Complaint Information obtained from Patient Sacral ulcer History of Present Illness (HPI) Admission 01/18/2021 Mr. Tanmay Halteman is a 56 year old male with a past medical history of CVA with left-sided hemiparesis, language and speech deficits, hypertension, and seizure disorder that presents the clinic for a 5-month history of sacral ulcer. He was hospitalized on 11/23/2020 for new onset A. fib and GI bleed. He states that during this hospitalization he developed a pressure ulcer to his sacrum. He reports using Santyl to the wound bed twice daily. He has a group 2 air mattress and states he has been offloading the area by staying on his side. He currently denies signs of infection. He reports improvement in wound healing over the past month. 1/19; patient  presents for follow-up. His pharmacy has ordered Dakin's but is not available yet to the patient. He has continued with Santyl daily. He has no issues or complaints today. He denies signs of infection. 1/26; patient presents for follow-up. He has been using Hydrofera Blue to the wound beds. He has no issues or complaints today. He denies signs of infection. 2/2; patient presents for follow-up. He has been using Hydrofera Blue to the wound beds. He has no issues or complaints today. He had an x-ray that showed new mild cortical erosion at the junction of the first and second coccygeal segments concerning for acute osteomyelitis. He currently denies systemic signs of infection. 2/16; patient presents for follow-up. He continues to use Dakin's wet-to-dry dressings without issues. He saw Dr. Tommy Medal on 02/19/2021. At that time he had recommended IV antibiotics for 6 weeks. Patient states that after the office visit he reconsidered doing a PICC line. He would like to try oral antibiotics instead. He currently denies signs of infection. He is trying to aggressively offload the area. Patient History Information obtained from Patient, Chart. Family History Unknown History. Social History Never smoker, Marital Status - Single, Alcohol Use - Never, Drug Use - Prior History, Caffeine Use - Rarely. Medical History Eyes Denies history of Cataracts, Glaucoma, Optic Neuritis Ear/Nose/Mouth/Throat Denies history of Chronic sinus problems/congestion, Middle ear problems Respiratory Denies history of Aspiration, Asthma, Chronic Obstructive Pulmonary Disease (COPD), Pneumothorax, Sleep Apnea, Tuberculosis Cardiovascular Patient has history of Hypertension Gastrointestinal Denies history of Cirrhosis , Colitis, Crohnoos, Hepatitis A, Hepatitis B, Hepatitis C Endocrine Patient has history of Type II Diabetes Denies history of Type I Diabetes Musculoskeletal Denies history of Gout, Rheumatoid Arthritis,  Osteoarthritis, Osteomyelitis Neurologic Patient has history of Paraplegia, Seizure Disorder Denies history of Dementia, Neuropathy, Quadriplegia Medical A Surgical History Notes nd Genitourinary CKD stage 2 Objective Constitutional respirations regular, non-labored and within target range for patient.. Vitals Time Taken: 8:28 AM, Height: 64 in, Temperature: 98.4 F, Pulse: 78 bpm, Respiratory Rate: 20 breaths/min, Blood Pressure: 154/94 mmHg. Psychiatric pleasant and cooperative. General Notes: Sacral ulcer with hyper granulated tissue throughout. Tunneling at the 12 o'clock position that probes close to bone. No signs of infection. Integumentary (Hair, Skin) Wound #1 status is Open. Original cause of wound was Pressure Injury. The date acquired was: 11/13/2020. The wound has been in treatment 5 weeks. The wound is located on the Sacrum. The wound measures 2cm length x 2cm width x 1cm depth; 3.142cm^2 area and 3.142cm^3 volume. There is bone and Fat Layer (Subcutaneous Tissue) exposed. There is no tunneling noted, however, there is undermining starting at 12:00 and ending at  3:00 with a maximum distance of 1.2cm. There is a medium amount of serosanguineous drainage noted. The wound margin is distinct with the outline attached to the wound base. There is large (67-100%) red, pink, hyper - granulation within the wound bed. There is no necrotic tissue within the wound bed. Assessment Active Problems ICD-10 Pressure ulcer of sacral region, stage 4 Osteomyelitis of vertebra, sacral and sacrococcygeal region Hemiplegia and hemiparesis following cerebral infarction affecting unspecified side Type 2 diabetes mellitus with hyperglycemia Patient's wound is stable. It is not as deep as the previous clinic visit. However there is extensive hyper granulated tissue and this may be blocking the true depth. I used silver nitrate to this area. I recommended continuing with Dakin's wet-to-dry dressing  and aggressive offloading. I recommended she call Dr. Arlyss Queen office to see if they can switch over to oral antibiotics for 6 weeks. All questions and concerns were addressed during the clinic visit. Procedures Wound #1 Pre-procedure diagnosis of Wound #1 is a Pressure Ulcer located on the Sacrum . An Chemical Cauterization procedure was performed by Kalman Shan, DO. Post procedure Diagnosis Wound #1: Same as Pre-Procedure Plan Follow-up Appointments: Return Appointment in 2 weeks. - Dr. Heber [place] (Room 7, [place] [place]) Other: - Call infectious disease to let them know you prefer oral antibiotics instead of IV. Off-Loading: Wound #1 Sacrum: Low air-loss mattress (Group 2) - continue to use. Turn and reposition every 2 hours Other: - continue to use specialty mattress. minimize in wheelchair to help aid in healing pressure wound. no pressure to sacral area to prevent worsening. Home Health: Wound #1 Sacrum: No change in wound care orders this week; continue Home Health for wound care. May utilize formulary equivalent dressing for wound treatment orders unless otherwise specified. Other Home Health Orders/Instructions: - [facility] WOUND #1: - Sacrum Wound Laterality: Cleanser: Wound Cleanser (Home Health) 2 x Per Day/30 Days Discharge Instructions: Cleanse the wound with wound cleanser prior to applying a clean dressing using gauze sponges, not tissue or cotton balls. Peri-Wound Care: Skin Prep (Home Health) 2 x Per Day/30 Days Discharge Instructions: Use skin prep as directed Prim Dressing: Dakin's Solution moisten gauze ([place]) 2 x Per Day/30 Days ary Discharge Instructions: Dakin's moisten gauze directly to undermining and wound bed. Secondary Dressing: Woven Gauze Sponges 2x2 in (Home Health) 2 x Per Day/30 Days Discharge Instructions: Apply over primary dressing as directed. Secondary Dressing: Zetuvit Plus Silicone Border Dressing 4x4 (in/in) (Home Health) 2 x Per Day/30  Days Discharge Instructions: Apply silicone border over primary dressing as directed. 1. Dakin's wet-to-dry 2. Aggressive offloading 3. Follow-up in 2 weeks Electronic Signature(s) Signed: 02/28/2021 9:21:42 AM By: Kalman Shan DO Entered By: Kalman Shan on 02/28/2021 09:20:24 -------------------------------------------------------------------------------- HxROS Details Patient Name: Date of Service: [name], [name] 02/28/2021 8:15 A M Medical Record Number: [id number] Patient Account Number: [id number] Date of Birth/Sex: Treating RN: [birth year] (56 y.o. M) Primary Care Provider: Karle Plumber Other Clinician: Referring Provider: Treating Provider/Extender: Sammuel Bailiff in Treatment: 5 Information Obtained From Patient Chart Eyes Medical History: Negative for: Cataracts; Glaucoma; Optic Neuritis Ear/Nose/Mouth/Throat Medical History: Negative for: Chronic sinus problems/congestion; Middle ear problems Respiratory Medical History: Negative for: Aspiration; Asthma; Chronic Obstructive Pulmonary Disease (COPD); Pneumothorax; Sleep Apnea; Tuberculosis Cardiovascular Medical History: Positive for: Hypertension Gastrointestinal Medical History: Negative for: Cirrhosis ; Colitis; Crohns; Hepatitis A; Hepatitis B; Hepatitis C Endocrine Medical History: Positive for: Type II Diabetes Negative for: Type I Diabetes Genitourinary Medical History: Past Medical History  Notes: CKD stage 2 Musculoskeletal Medical History: Negative for: Gout; Rheumatoid Arthritis; Osteoarthritis; Osteomyelitis Neurologic Medical History: Positive for: Paraplegia; Seizure Disorder Negative for: Dementia; Neuropathy; Quadriplegia Immunizations Pneumococcal Vaccine: Received Pneumococcal Vaccination: Yes Received Pneumococcal Vaccination On or After 60th Birthday: Yes Implantable Devices None Family and Social History Unknown History: Yes; Never  smoker; Marital Status - Single; Alcohol Use: Never; Drug Use: Prior History; Caffeine Use: Rarely; Financial Concerns: No; Food, Clothing or Shelter Needs: No; Support System Lacking: No; Transportation Concerns: No Electronic Signature(s) Signed: 02/28/2021 9:21:42 AM By: Kalman Shan DO Entered By: Kalman Shan on 02/28/2021 09:17:27 -------------------------------------------------------------------------------- SuperBill Details Patient Name: Date of Service: [name], [place] D. 02/28/2021 Medical Record Number: [id number] Patient Account Number: [id number] Date of Birth/Sex: Treating RN: [birth year] (56 y.o. [name] Primary Care Provider: Karle Plumber Other Clinician: Referring Provider: Treating Provider/Extender: Sammuel Bailiff in Treatment: 5 Diagnosis Coding ICD-10 Codes Code Description [phone] Pressure ulcer of sacral region, stage 4 M46.28 Osteomyelitis of vertebra, sacral and sacrococcygeal region I69.359 Hemiplegia and hemiparesis following cerebral infarction affecting unspecified side E11.65 Type 2 diabetes mellitus with hyperglycemia Facility Procedures CPT4 Code: [id number] IC L Description: 4 - CHEM CAUT GRANULATION TISS 1 D-10 Diagnosis Description 89.154 Pressure ulcer of sacral region, stage 4 Modifier: Quantity: Physician Procedures : CPT4 Code Description Modifier [id number] - WC PHYS CHEM CAUT GRAN TISSUE ICD-10 Diagnosis Description L89.154 Pressure ulcer of sacral region, stage 4 Quantity: 1 Electronic Signature(s) Signed: 02/28/2021 9:21:42 AM By: Kalman Shan DO Entered By: Kalman Shan on 02/28/2021 09:21:17

## 2021-03-01 ENCOUNTER — Telehealth: Payer: Self-pay

## 2021-03-01 DIAGNOSIS — Z9089 Acquired absence of other organs: Secondary | ICD-10-CM | POA: Diagnosis not present

## 2021-03-01 DIAGNOSIS — I4891 Unspecified atrial fibrillation: Secondary | ICD-10-CM | POA: Diagnosis not present

## 2021-03-01 DIAGNOSIS — E119 Type 2 diabetes mellitus without complications: Secondary | ICD-10-CM | POA: Diagnosis not present

## 2021-03-01 DIAGNOSIS — Z9049 Acquired absence of other specified parts of digestive tract: Secondary | ICD-10-CM | POA: Diagnosis not present

## 2021-03-01 DIAGNOSIS — I700 Atherosclerosis of aorta: Secondary | ICD-10-CM | POA: Diagnosis not present

## 2021-03-01 DIAGNOSIS — I69322 Dysarthria following cerebral infarction: Secondary | ICD-10-CM | POA: Diagnosis not present

## 2021-03-01 DIAGNOSIS — Z79891 Long term (current) use of opiate analgesic: Secondary | ICD-10-CM | POA: Diagnosis not present

## 2021-03-01 DIAGNOSIS — D509 Iron deficiency anemia, unspecified: Secondary | ICD-10-CM | POA: Diagnosis not present

## 2021-03-01 DIAGNOSIS — E669 Obesity, unspecified: Secondary | ICD-10-CM | POA: Diagnosis not present

## 2021-03-01 DIAGNOSIS — I10 Essential (primary) hypertension: Secondary | ICD-10-CM | POA: Diagnosis not present

## 2021-03-01 DIAGNOSIS — G40909 Epilepsy, unspecified, not intractable, without status epilepticus: Secondary | ICD-10-CM | POA: Diagnosis not present

## 2021-03-01 DIAGNOSIS — L89894 Pressure ulcer of other site, stage 4: Secondary | ICD-10-CM | POA: Diagnosis not present

## 2021-03-01 DIAGNOSIS — I69354 Hemiplegia and hemiparesis following cerebral infarction affecting left non-dominant side: Secondary | ICD-10-CM | POA: Diagnosis not present

## 2021-03-01 DIAGNOSIS — I69328 Other speech and language deficits following cerebral infarction: Secondary | ICD-10-CM | POA: Diagnosis not present

## 2021-03-01 DIAGNOSIS — M109 Gout, unspecified: Secondary | ICD-10-CM | POA: Diagnosis not present

## 2021-03-01 DIAGNOSIS — E785 Hyperlipidemia, unspecified: Secondary | ICD-10-CM | POA: Diagnosis not present

## 2021-03-01 NOTE — Telephone Encounter (Signed)
Called patient to notify him of oral abx that was sent to pharmacy and to remind him of his f/u appt with Dr. Tommy Medal. Patient stated that he picked his medication up from the pharmacy and he will f/u for his next appt.

## 2021-03-04 DIAGNOSIS — I700 Atherosclerosis of aorta: Secondary | ICD-10-CM | POA: Diagnosis not present

## 2021-03-04 DIAGNOSIS — I69328 Other speech and language deficits following cerebral infarction: Secondary | ICD-10-CM | POA: Diagnosis not present

## 2021-03-04 DIAGNOSIS — I69322 Dysarthria following cerebral infarction: Secondary | ICD-10-CM | POA: Diagnosis not present

## 2021-03-04 DIAGNOSIS — E669 Obesity, unspecified: Secondary | ICD-10-CM | POA: Diagnosis not present

## 2021-03-04 DIAGNOSIS — Z79891 Long term (current) use of opiate analgesic: Secondary | ICD-10-CM | POA: Diagnosis not present

## 2021-03-04 DIAGNOSIS — I4891 Unspecified atrial fibrillation: Secondary | ICD-10-CM | POA: Diagnosis not present

## 2021-03-04 DIAGNOSIS — I69354 Hemiplegia and hemiparesis following cerebral infarction affecting left non-dominant side: Secondary | ICD-10-CM | POA: Diagnosis not present

## 2021-03-04 DIAGNOSIS — E119 Type 2 diabetes mellitus without complications: Secondary | ICD-10-CM | POA: Diagnosis not present

## 2021-03-04 DIAGNOSIS — E785 Hyperlipidemia, unspecified: Secondary | ICD-10-CM | POA: Diagnosis not present

## 2021-03-04 DIAGNOSIS — I10 Essential (primary) hypertension: Secondary | ICD-10-CM | POA: Diagnosis not present

## 2021-03-04 DIAGNOSIS — D509 Iron deficiency anemia, unspecified: Secondary | ICD-10-CM | POA: Diagnosis not present

## 2021-03-04 DIAGNOSIS — Z9089 Acquired absence of other organs: Secondary | ICD-10-CM | POA: Diagnosis not present

## 2021-03-04 DIAGNOSIS — Z9049 Acquired absence of other specified parts of digestive tract: Secondary | ICD-10-CM | POA: Diagnosis not present

## 2021-03-04 DIAGNOSIS — L89894 Pressure ulcer of other site, stage 4: Secondary | ICD-10-CM | POA: Diagnosis not present

## 2021-03-04 DIAGNOSIS — G40909 Epilepsy, unspecified, not intractable, without status epilepticus: Secondary | ICD-10-CM | POA: Diagnosis not present

## 2021-03-04 DIAGNOSIS — M109 Gout, unspecified: Secondary | ICD-10-CM | POA: Diagnosis not present

## 2021-03-05 ENCOUNTER — Telehealth: Payer: Self-pay

## 2021-03-05 DIAGNOSIS — I4891 Unspecified atrial fibrillation: Secondary | ICD-10-CM | POA: Diagnosis not present

## 2021-03-05 DIAGNOSIS — E669 Obesity, unspecified: Secondary | ICD-10-CM | POA: Diagnosis not present

## 2021-03-05 DIAGNOSIS — I69354 Hemiplegia and hemiparesis following cerebral infarction affecting left non-dominant side: Secondary | ICD-10-CM | POA: Diagnosis not present

## 2021-03-05 DIAGNOSIS — M109 Gout, unspecified: Secondary | ICD-10-CM | POA: Diagnosis not present

## 2021-03-05 DIAGNOSIS — I69328 Other speech and language deficits following cerebral infarction: Secondary | ICD-10-CM | POA: Diagnosis not present

## 2021-03-05 DIAGNOSIS — I700 Atherosclerosis of aorta: Secondary | ICD-10-CM | POA: Diagnosis not present

## 2021-03-05 DIAGNOSIS — E785 Hyperlipidemia, unspecified: Secondary | ICD-10-CM | POA: Diagnosis not present

## 2021-03-05 DIAGNOSIS — L89894 Pressure ulcer of other site, stage 4: Secondary | ICD-10-CM | POA: Diagnosis not present

## 2021-03-05 DIAGNOSIS — Z79891 Long term (current) use of opiate analgesic: Secondary | ICD-10-CM | POA: Diagnosis not present

## 2021-03-05 DIAGNOSIS — I10 Essential (primary) hypertension: Secondary | ICD-10-CM | POA: Diagnosis not present

## 2021-03-05 DIAGNOSIS — Z9089 Acquired absence of other organs: Secondary | ICD-10-CM | POA: Diagnosis not present

## 2021-03-05 DIAGNOSIS — G40909 Epilepsy, unspecified, not intractable, without status epilepticus: Secondary | ICD-10-CM | POA: Diagnosis not present

## 2021-03-05 DIAGNOSIS — Z9049 Acquired absence of other specified parts of digestive tract: Secondary | ICD-10-CM | POA: Diagnosis not present

## 2021-03-05 DIAGNOSIS — E119 Type 2 diabetes mellitus without complications: Secondary | ICD-10-CM | POA: Diagnosis not present

## 2021-03-05 DIAGNOSIS — D509 Iron deficiency anemia, unspecified: Secondary | ICD-10-CM | POA: Diagnosis not present

## 2021-03-05 DIAGNOSIS — I69322 Dysarthria following cerebral infarction: Secondary | ICD-10-CM | POA: Diagnosis not present

## 2021-03-05 NOTE — Telephone Encounter (Signed)
Copied from [place] [id number]. Topic: General - Other >> Mar 05, 2021 10:06 AM Yvette Rack wrote: Reason for CRM: Sherri with [facility] stated patient had a fall during the transfer from his chair to the bed. Judeen Hammans stated EMS came out and was able to get patient in to bed. Cb# [phone]

## 2021-03-05 NOTE — Telephone Encounter (Signed)
Will forward to provider  

## 2021-03-06 NOTE — Telephone Encounter (Signed)
Phone call placed to patient today after reviewing the message sent to me about his fall that occurred 2 days ago.  Patient tells me that he was transferring from his wheelchair to his bed and his foot got caught and he fell.  He tells me his usual routine that he and his wife had worked out for him transferring into his bed is that he would go from his chair to the bedside commode then onto the bed.  He states he deleted the middle step that he usually does to be successful in this transfer and he fell as a result of it.  He denies any injury.  He states that he will go back to his usual routine of transfer. I inquired whether he got a denial letter as yet for his air mattress as I had spoken with his insurance 2 to 3 weeks ago and it was denied.  I had planned to write a letter to challenge it.  Patient states he has not received a denial letter as yet.  I request that he sends it to me as soon as he gets it.

## 2021-03-07 DIAGNOSIS — D509 Iron deficiency anemia, unspecified: Secondary | ICD-10-CM | POA: Diagnosis not present

## 2021-03-07 DIAGNOSIS — G40909 Epilepsy, unspecified, not intractable, without status epilepticus: Secondary | ICD-10-CM | POA: Diagnosis not present

## 2021-03-07 DIAGNOSIS — M109 Gout, unspecified: Secondary | ICD-10-CM | POA: Diagnosis not present

## 2021-03-07 DIAGNOSIS — L89894 Pressure ulcer of other site, stage 4: Secondary | ICD-10-CM | POA: Diagnosis not present

## 2021-03-07 DIAGNOSIS — I700 Atherosclerosis of aorta: Secondary | ICD-10-CM | POA: Diagnosis not present

## 2021-03-07 DIAGNOSIS — Z9089 Acquired absence of other organs: Secondary | ICD-10-CM | POA: Diagnosis not present

## 2021-03-07 DIAGNOSIS — I4891 Unspecified atrial fibrillation: Secondary | ICD-10-CM | POA: Diagnosis not present

## 2021-03-07 DIAGNOSIS — I69322 Dysarthria following cerebral infarction: Secondary | ICD-10-CM | POA: Diagnosis not present

## 2021-03-07 DIAGNOSIS — Z9049 Acquired absence of other specified parts of digestive tract: Secondary | ICD-10-CM | POA: Diagnosis not present

## 2021-03-07 DIAGNOSIS — E669 Obesity, unspecified: Secondary | ICD-10-CM | POA: Diagnosis not present

## 2021-03-07 DIAGNOSIS — Z79891 Long term (current) use of opiate analgesic: Secondary | ICD-10-CM | POA: Diagnosis not present

## 2021-03-07 DIAGNOSIS — I69328 Other speech and language deficits following cerebral infarction: Secondary | ICD-10-CM | POA: Diagnosis not present

## 2021-03-07 DIAGNOSIS — I10 Essential (primary) hypertension: Secondary | ICD-10-CM | POA: Diagnosis not present

## 2021-03-07 DIAGNOSIS — I69354 Hemiplegia and hemiparesis following cerebral infarction affecting left non-dominant side: Secondary | ICD-10-CM | POA: Diagnosis not present

## 2021-03-07 DIAGNOSIS — E785 Hyperlipidemia, unspecified: Secondary | ICD-10-CM | POA: Diagnosis not present

## 2021-03-07 DIAGNOSIS — E119 Type 2 diabetes mellitus without complications: Secondary | ICD-10-CM | POA: Diagnosis not present

## 2021-03-11 DIAGNOSIS — I4891 Unspecified atrial fibrillation: Secondary | ICD-10-CM | POA: Diagnosis not present

## 2021-03-11 DIAGNOSIS — E785 Hyperlipidemia, unspecified: Secondary | ICD-10-CM | POA: Diagnosis not present

## 2021-03-11 DIAGNOSIS — Z79891 Long term (current) use of opiate analgesic: Secondary | ICD-10-CM | POA: Diagnosis not present

## 2021-03-11 DIAGNOSIS — G40909 Epilepsy, unspecified, not intractable, without status epilepticus: Secondary | ICD-10-CM | POA: Diagnosis not present

## 2021-03-11 DIAGNOSIS — D509 Iron deficiency anemia, unspecified: Secondary | ICD-10-CM | POA: Diagnosis not present

## 2021-03-11 DIAGNOSIS — I10 Essential (primary) hypertension: Secondary | ICD-10-CM | POA: Diagnosis not present

## 2021-03-11 DIAGNOSIS — E669 Obesity, unspecified: Secondary | ICD-10-CM | POA: Diagnosis not present

## 2021-03-11 DIAGNOSIS — Z9049 Acquired absence of other specified parts of digestive tract: Secondary | ICD-10-CM | POA: Diagnosis not present

## 2021-03-11 DIAGNOSIS — I69322 Dysarthria following cerebral infarction: Secondary | ICD-10-CM | POA: Diagnosis not present

## 2021-03-11 DIAGNOSIS — E119 Type 2 diabetes mellitus without complications: Secondary | ICD-10-CM | POA: Diagnosis not present

## 2021-03-11 DIAGNOSIS — I69328 Other speech and language deficits following cerebral infarction: Secondary | ICD-10-CM | POA: Diagnosis not present

## 2021-03-11 DIAGNOSIS — I69354 Hemiplegia and hemiparesis following cerebral infarction affecting left non-dominant side: Secondary | ICD-10-CM | POA: Diagnosis not present

## 2021-03-11 DIAGNOSIS — Z9089 Acquired absence of other organs: Secondary | ICD-10-CM | POA: Diagnosis not present

## 2021-03-11 DIAGNOSIS — M109 Gout, unspecified: Secondary | ICD-10-CM | POA: Diagnosis not present

## 2021-03-11 DIAGNOSIS — I700 Atherosclerosis of aorta: Secondary | ICD-10-CM | POA: Diagnosis not present

## 2021-03-11 DIAGNOSIS — L89894 Pressure ulcer of other site, stage 4: Secondary | ICD-10-CM | POA: Diagnosis not present

## 2021-03-12 DIAGNOSIS — I4891 Unspecified atrial fibrillation: Secondary | ICD-10-CM | POA: Diagnosis not present

## 2021-03-12 DIAGNOSIS — Z9089 Acquired absence of other organs: Secondary | ICD-10-CM | POA: Diagnosis not present

## 2021-03-12 DIAGNOSIS — Z79891 Long term (current) use of opiate analgesic: Secondary | ICD-10-CM | POA: Diagnosis not present

## 2021-03-12 DIAGNOSIS — D509 Iron deficiency anemia, unspecified: Secondary | ICD-10-CM | POA: Diagnosis not present

## 2021-03-12 DIAGNOSIS — I700 Atherosclerosis of aorta: Secondary | ICD-10-CM | POA: Diagnosis not present

## 2021-03-12 DIAGNOSIS — I69328 Other speech and language deficits following cerebral infarction: Secondary | ICD-10-CM | POA: Diagnosis not present

## 2021-03-12 DIAGNOSIS — G40909 Epilepsy, unspecified, not intractable, without status epilepticus: Secondary | ICD-10-CM | POA: Diagnosis not present

## 2021-03-12 DIAGNOSIS — M109 Gout, unspecified: Secondary | ICD-10-CM | POA: Diagnosis not present

## 2021-03-12 DIAGNOSIS — L89894 Pressure ulcer of other site, stage 4: Secondary | ICD-10-CM | POA: Diagnosis not present

## 2021-03-12 DIAGNOSIS — Z9049 Acquired absence of other specified parts of digestive tract: Secondary | ICD-10-CM | POA: Diagnosis not present

## 2021-03-12 DIAGNOSIS — E785 Hyperlipidemia, unspecified: Secondary | ICD-10-CM | POA: Diagnosis not present

## 2021-03-12 DIAGNOSIS — E119 Type 2 diabetes mellitus without complications: Secondary | ICD-10-CM | POA: Diagnosis not present

## 2021-03-12 DIAGNOSIS — I10 Essential (primary) hypertension: Secondary | ICD-10-CM | POA: Diagnosis not present

## 2021-03-12 DIAGNOSIS — I69322 Dysarthria following cerebral infarction: Secondary | ICD-10-CM | POA: Diagnosis not present

## 2021-03-12 DIAGNOSIS — I69354 Hemiplegia and hemiparesis following cerebral infarction affecting left non-dominant side: Secondary | ICD-10-CM | POA: Diagnosis not present

## 2021-03-12 DIAGNOSIS — E669 Obesity, unspecified: Secondary | ICD-10-CM | POA: Diagnosis not present

## 2021-03-14 ENCOUNTER — Encounter (HOSPITAL_BASED_OUTPATIENT_CLINIC_OR_DEPARTMENT_OTHER): Payer: BC Managed Care – PPO | Attending: Internal Medicine | Admitting: Internal Medicine

## 2021-03-14 ENCOUNTER — Other Ambulatory Visit: Payer: Self-pay

## 2021-03-14 DIAGNOSIS — I69354 Hemiplegia and hemiparesis following cerebral infarction affecting left non-dominant side: Secondary | ICD-10-CM | POA: Diagnosis not present

## 2021-03-14 DIAGNOSIS — L89154 Pressure ulcer of sacral region, stage 4: Secondary | ICD-10-CM | POA: Diagnosis not present

## 2021-03-14 DIAGNOSIS — E1165 Type 2 diabetes mellitus with hyperglycemia: Secondary | ICD-10-CM | POA: Diagnosis not present

## 2021-03-14 DIAGNOSIS — M4628 Osteomyelitis of vertebra, sacral and sacrococcygeal region: Secondary | ICD-10-CM | POA: Insufficient documentation

## 2021-03-14 DIAGNOSIS — L89300 Pressure ulcer of unspecified buttock, unstageable: Secondary | ICD-10-CM | POA: Diagnosis not present

## 2021-03-14 NOTE — Progress Notes (Signed)
DRYDEN, TAPLEY ([id number]) Visit Report for 03/14/2021 Arrival Information Details Patient Name: Date of Service: [name] [place] D. 03/14/2021 8:45 A M Medical Record Number: [id number] Patient Account Number: [id number] Date of Birth/Sex: Treating RN: Feb 28, 1965 (56 y.o. [name] Primary Care Michaelangelo Mittelman: Karle Plumber Other Clinician: Referring Jaskirat Zertuche: Treating Atarah Cadogan/Extender: Sammuel Bailiff in Treatment: 7 Visit Information History Since Last Visit Added or deleted any medications: No Patient Arrived: Wheel Chair Any new allergies or adverse reactions: No Arrival Time: 09:04 Had a fall or experienced change in No Accompanied By: wife activities of daily living that may affect Transfer Assistance: Manual risk of falls: Patient Identification Verified: Yes Signs or symptoms of abuse/neglect since last visito No Secondary Verification Process Completed: Yes Hospitalized since last visit: No Patient Requires Transmission-Based Precautions: No Implantable device outside of the clinic excluding No Patient Has Alerts: No cellular tissue based products placed in the center since last visit: Has Dressing in Place as Prescribed: Yes Pain Present Now: No Electronic Signature(s) Signed: 03/14/2021 5:00:00 PM By: Lorrin Jackson Entered By: Lorrin Jackson on 03/14/2021 09:11:42 -------------------------------------------------------------------------------- Encounter Discharge Information Details Patient Name: Date of Service: [name], [place] D. 03/14/2021 8:45 A M Medical Record Number: [id number] Patient Account Number: [id number] Date of Birth/Sex: Treating RN: August 24, 1965 (56 y.o. [name] Primary Care Elliot Simoneaux: Karle Plumber Other Clinician: Referring Brynlee Pennywell: Treating Eriel Dunckel/Extender: Sammuel Bailiff in Treatment: 7 Encounter Discharge Information Items Discharge Condition: Stable Ambulatory  Status: Walker Discharge Destination: Home Transportation: Private Auto Accompanied By: wife Schedule Follow-up Appointment: Yes Clinical Summary of Care: Provided on 03/14/2021 Form Type Recipient Paper Patient Patient Electronic Signature(s) Signed: 03/14/2021 5:00:00 PM By: Lorrin Jackson Entered By: Lorrin Jackson on 03/14/2021 09:36:32 -------------------------------------------------------------------------------- Lower Extremity Assessment Details Patient Name: Date of Service: [name] IN D. 03/14/2021 8:45 A M Medical Record Number: [id number] Patient Account Number: [id number] Date of Birth/Sex: Treating RN: 13-Jan-1966 (55 y.o. [name] Primary Care Yesena Reaves: Karle Plumber Other Clinician: Referring Aarsh Fristoe: Treating Orpah Hausner/Extender: Sammuel Bailiff in Treatment: 7 Electronic Signature(s) Signed: 03/14/2021 5:00:00 PM By: Lorrin Jackson Entered By: Lorrin Jackson on 03/14/2021 09:12:20 -------------------------------------------------------------------------------- Multi Wound Chart Details Patient Name: Date of Service: [name] IN D. 03/14/2021 8:45 A M Medical Record Number: [id number] Patient Account Number: [id number] Date of Birth/Sex: Treating RN: 1966/01/07 (56 y.o. M) Primary Care Mckensi Redinger: Karle Plumber Other Clinician: Referring Steph Cheadle: Treating Arles Rumbold/Extender: Sammuel Bailiff in Treatment: 7 Vital Signs Height(in): 64 Pulse(bpm): 80 Weight(lbs): Blood Pressure(mmHg): 130/76 Body Mass Index(BMI): Temperature(F): 99.1 Respiratory Rate(breaths/min): 18 Photos: [N/A:N/A] Sacrum N/A N/A Wound Location: Pressure Injury N/A N/A Wounding Event: Pressure Ulcer N/A N/A Primary Etiology: Hypertension, Type II Diabetes, N/A N/A Comorbid History: Paraplegia, Seizure Disorder 11/13/2020 N/A N/A Date Acquired: 7 N/A N/A Weeks of Treatment: Open N/A N/A Wound Status: No  N/A N/A Wound Recurrence: 1.2x1x0.4 N/A N/A Measurements L x W x D (cm) 0.942 N/A N/A A (cm) : rea 0.377 N/A N/A Volume (cm) : 86.30% N/A N/A % Reduction in A rea: 96.60% N/A N/A % Reduction in Volume: 12 Starting Position 1 (o'clock): 3 Ending Position 1 (o'clock): 0.5 Maximum Distance 1 (cm): Yes N/A N/A Undermining: Category/Stage IV N/A N/A Classification: Medium N/A N/A Exudate A mount: Serosanguineous N/A N/A Exudate Type: red, brown N/A N/A Exudate Color: Distinct, outline attached N/A N/A Wound Margin: Large (67-100%) N/A N/A Granulation Amount: Red, Pink, Hyper-granulation N/A N/A Granulation Quality: None Present (  0%) N/A N/A Necrotic Amount: Fat Layer (Subcutaneous Tissue): Yes N/A N/A Exposed Structures: Fascia: No Tendon: No Muscle: No Joint: No Bone: No Medium (34-66%) N/A N/A Epithelialization: Chemical Cauterization N/A N/A Procedures Performed: Treatment Notes Wound #1 (Sacrum) Cleanser Wound Cleanser Discharge Instruction: Cleanse the wound with wound cleanser prior to applying a clean dressing using gauze sponges, not tissue or cotton balls. Peri-Wound Care Skin Prep Discharge Instruction: Use skin prep as directed Topical Primary Dressing Dakin's Solution moisten gauze Discharge Instruction: Dakin's moisten gauze directly to undermining and wound bed. Secondary Dressing Woven Gauze Sponges 2x2 in Discharge Instruction: Apply over primary dressing as directed. Zetuvit Plus Silicone Border Dressing 4x4 (in/in) Discharge Instruction: Apply silicone border over primary dressing as directed. Secured With Compression Wrap Compression Stockings Add-Ons Electronic Signature(s) Signed: 03/14/2021 10:40:17 AM By: Kalman Shan DO Entered By: Kalman Shan on 03/14/2021 10:35:28 -------------------------------------------------------------------------------- Multi-Disciplinary Care Plan Details Patient Name: Date of  Service: [name], [name] [place] D. 03/14/2021 8:45 A M Medical Record Number: [id number] Patient Account Number: [id number] Date of Birth/Sex: Treating RN: November 15, 1965 (56 y.o. [name] Primary Care Romin Divita: Karle Plumber Other Clinician: Referring Amron Guerrette: Treating Mackensi Mahadeo/Extender: Sammuel Bailiff in Treatment: 7 Active Inactive Abuse / Safety / Falls / Self Care Management Nursing Diagnoses: Potential for falls Potential for injury related to falls Goals: Patient will remain injury free related to falls Date Initiated: 01/18/2021 Target Resolution Date: 04/04/2021 Goal Status: Active Patient/caregiver will verbalize/demonstrate measure taken to improve self care Date Initiated: 01/18/2021 Target Resolution Date: 04/04/2021 Goal Status: Active Interventions: Assess: immobility, friction, shearing, incontinence upon admission and as needed Assess self care needs on admission and as needed Notes: Pressure Nursing Diagnoses: Knowledge deficit related to causes and risk factors for pressure ulcer development Potential for impaired tissue integrity related to pressure, friction, moisture, and shear Goals: Patient will remain free from development of additional pressure ulcers Date Initiated: 01/18/2021 Target Resolution Date: 04/04/2021 Goal Status: Active Patient/caregiver will verbalize risk factors for pressure ulcer development Date Initiated: 01/18/2021 Target Resolution Date: 04/04/2021 Goal Status: Active Interventions: Assess: immobility, friction, shearing, incontinence upon admission and as needed Assess offloading mechanisms upon admission and as needed Assess potential for pressure ulcer upon admission and as needed Provide education on pressure ulcers Treatment Activities: Patient referred for home evaluation of offloading devices/mattresses : 01/18/2021 Patient referred for pressure reduction/relief devices : 01/18/2021 Pressure  reduction/relief device ordered : 01/18/2021 Notes: Wound/Skin Impairment Nursing Diagnoses: Knowledge deficit related to ulceration/compromised skin integrity Goals: Patient/caregiver will verbalize understanding of skin care regimen Date Initiated: 01/18/2021 Target Resolution Date: 04/04/2021 Goal Status: Active Interventions: Assess patient/caregiver ability to perform ulcer/skin care regimen upon admission and as needed Assess ulceration(s) every visit Provide education on ulcer and skin care Treatment Activities: Skin care regimen initiated : 01/18/2021 Topical wound management initiated : 01/18/2021 Notes: Electronic Signature(s) Signed: 03/14/2021 5:00:00 PM By: Lorrin Jackson Entered By: Lorrin Jackson on 03/14/2021 09:04:17 -------------------------------------------------------------------------------- Pain Assessment Details Patient Name: Date of Service: [name] IN D. 03/14/2021 8:45 A M Medical Record Number: [id number] Patient Account Number: [id number] Date of Birth/Sex: Treating RN: 1965/04/29 (56 y.o. [name] Primary Care Terie Lear: Karle Plumber Other Clinician: Referring Rage Beever: Treating Caston Coopersmith/Extender: Sammuel Bailiff in Treatment: 7 Active Problems Location of Pain Severity and Description of Pain Patient Has Paino No Site Locations Pain Management and Medication Current Pain Management: Electronic Signature(s) Signed: 03/14/2021 5:00:00 PM By: Lorrin Jackson Entered By: Lorrin Jackson on 03/14/2021 09:12:15 --------------------------------------------------------------------------------  Patient/Caregiver Education Details Patient Name: Date of Service: [name] [place] D. 3/2/2023andnbsp8:45 [place] Record Number: [id number] Patient Account Number: [id number] Date of Birth/Gender: Treating RN: March 29, 1965 (56 y.o. [name] Primary Care Physician: Karle Plumber Other Clinician: Referring  Physician: Treating Physician/Extender: Sammuel Bailiff in Treatment: 7 Education Assessment Education Provided To: Patient and Caregiver Education Topics Provided Pressure: Methods: Explain/Verbal, Printed Responses: State content correctly Wound/Skin Impairment: Methods: Explain/Verbal, Printed Responses: State content correctly Electronic Signature(s) Signed: 03/14/2021 5:00:00 PM By: Lorrin Jackson Signed: 03/14/2021 5:00:00 PM By: Lorrin Jackson Entered By: Lorrin Jackson on 03/14/2021 09:04:41 -------------------------------------------------------------------------------- Wound Assessment Details Patient Name: Date of Service: [name] [place] D. 03/14/2021 8:45 A M Medical Record Number: [id number] Patient Account Number: [id number] Date of Birth/Sex: Treating RN: 30-Nov-1965 (56 y.o. [name] Primary Care Mackinley Kiehn: Karle Plumber Other Clinician: Referring Aleasha Fregeau: Treating Endi Lagman/Extender: Sammuel Bailiff in Treatment: 7 Wound Status Wound Number: 1 Primary Etiology: Pressure Ulcer Wound Location: Sacrum Wound Status: Open Wounding Event: Pressure Injury Comorbid Hypertension, Type II Diabetes, Paraplegia, Seizure History: Disorder Date Acquired: 11/13/2020 Weeks Of Treatment: 7 Clustered Wound: No Photos Wound Measurements Length: (cm) 1.2 Width: (cm) 1 Depth: (cm) 0.4 Area: (cm) 0.942 Volume: (cm) 0.377 % Reduction in Area: 86.3% % Reduction in Volume: 96.6% Epithelialization: Medium (34-66%) Tunneling: No Undermining: Yes Starting Position (o'clock): 12 Ending Position (o'clock): 3 Maximum Distance: (cm) 0.5 Wound Description Classification: Category/Stage IV Wound Margin: Distinct, outline attached Exudate Amount: Medium Exudate Type: Serosanguineous Exudate Color: red, brown Foul Odor After Cleansing: No Slough/Fibrino No Wound Bed Granulation Amount: Large (67-100%) Exposed  Structure Granulation Quality: Red, Pink, Hyper-granulation Fascia Exposed: No Necrotic Amount: None Present (0%) Fat Layer (Subcutaneous Tissue) Exposed: Yes Tendon Exposed: No Muscle Exposed: No Joint Exposed: No Bone Exposed: No Treatment Notes Wound #1 (Sacrum) Cleanser Wound Cleanser Discharge Instruction: Cleanse the wound with wound cleanser prior to applying a clean dressing using gauze sponges, not tissue or cotton balls. Peri-Wound Care Skin Prep Discharge Instruction: Use skin prep as directed Topical Primary Dressing Dakin's Solution moisten gauze Discharge Instruction: Dakin's moisten gauze directly to undermining and wound bed. Secondary Dressing Woven Gauze Sponges 2x2 in Discharge Instruction: Apply over primary dressing as directed. Zetuvit Plus Silicone Border Dressing 4x4 (in/in) Discharge Instruction: Apply silicone border over primary dressing as directed. Secured With Compression Wrap Compression Stockings Environmental education officer) Signed: 03/14/2021 5:00:00 PM By: Lorrin Jackson Previous Signature: 03/14/2021 9:23:12 AM Version By: Lorrin Jackson Entered By: Lorrin Jackson on 03/14/2021 09:35:38 -------------------------------------------------------------------------------- Vitals Details Patient Name: Date of Service: [name], [name] [place] D. 03/14/2021 8:45 A M Medical Record Number: [id number] Patient Account Number: [id number] Date of Birth/Sex: Treating RN: 1965/03/19 (56 y.o. [name] Primary Care Lumina Gitto: Karle Plumber Other Clinician: Referring Jadira Nierman: Treating Shadman Tozzi/Extender: Sammuel Bailiff in Treatment: 7 Vital Signs Time Taken: 09:11 Temperature (F): 99.1 Height (in): 64 Pulse (bpm): 80 Respiratory Rate (breaths/min): 18 Blood Pressure (mmHg): 130/76 Reference Range: 80 - 120 mg / dl Electronic Signature(s) Signed: 03/14/2021 5:00:00 PM By: Lorrin Jackson Entered By: Lorrin Jackson on  03/14/2021 09:12:07

## 2021-03-14 NOTE — Progress Notes (Signed)
Mark, [name] ([id number]) Visit Report for 03/14/2021 Chief Complaint Document Details Patient Name: Date of Service: [name] IN D. 03/14/2021 8:45 A M Medical Record Number: [id number] Patient Account Number: [id number] Date of Birth/Sex: Treating RN: [birth year] (56 y.o. M) Primary Care Provider: Karle Plumber Other Clinician: Referring Provider: Treating Provider/Extender: Sammuel Bailiff in Treatment: 7 Information Obtained from: Patient Chief Complaint Sacral ulcer Electronic Signature(s) Signed: 03/14/2021 10:40:17 AM By: Kalman Shan DO Entered By: Kalman Shan on 03/14/2021 10:35:36 -------------------------------------------------------------------------------- HPI Details Patient Name: Date of Service: [name], Mark D. 03/14/2021 8:45 A M Medical Record Number: [id number] Patient Account Number: [id number] Date of Birth/Sex: Treating RN: 05-Nov-1965 (56 y.o. M) Primary Care Provider: Karle Plumber Other Clinician: Referring Provider: Treating Provider/Extender: Sammuel Bailiff in Treatment: 7 History of Present Illness HPI Description: Admission 01/18/2021 [name] is a 56 year old male with a past medical history of CVA with left-sided hemiparesis, language and speech deficits, hypertension, and seizure disorder that presents the clinic for a 73-month history of sacral ulcer. He was hospitalized on 11/23/2020 for new onset A. fib and GI bleed. He states that during this hospitalization he developed a pressure ulcer to his sacrum. He reports using Santyl to the wound bed twice daily. He has a group 2 air mattress and states he has been offloading the area by staying on his side. He currently denies signs of infection. He reports improvement in wound healing over the past month. 1/19; patient presents for follow-up. His pharmacy has ordered Dakin's but is not available yet to the patient. He has  continued with Santyl daily. He has no issues or complaints today. He denies signs of infection. 1/26; patient presents for follow-up. He has been using Hydrofera Blue to the wound beds. He has no issues or complaints today. He denies signs of infection. 2/2; patient presents for follow-up. He has been using Hydrofera Blue to the wound beds. He has no issues or complaints today. He had an x-ray that showed new mild cortical erosion at the junction of the first and second coccygeal segments concerning for acute osteomyelitis. He currently denies systemic signs of infection. 2/16; patient presents for follow-up. He continues to use Dakin's wet-to-dry dressings without issues. He saw Dr. Tommy Medal on 02/19/2021. At that time he had recommended IV antibiotics for 6 weeks. Patient states that after the office visit he reconsidered doing a PICC line. He would like to try oral antibiotics instead. He currently denies signs of infection. He is trying to aggressively offload the area. 3/2; patient presents for follow-up. He continues to take Augmentin and doxycycline prescribed by infectious disease, Dr. Drucilla Schmidt for his sacral osteomyelitis. He currently denies signs of infection. He has been using Dakin's wet-to-dry dressings. Electronic Signature(s) Signed: 03/14/2021 10:40:17 AM By: Kalman Shan DO Entered By: Kalman Shan on 03/14/2021 10:36:39 -------------------------------------------------------------------------------- Chemical Cauterization Details Patient Name: Date of Service: [name] IN D. 03/14/2021 8:45 A M Medical Record Number: [id number] Patient Account Number: [id number] Date of Birth/Sex: Treating RN: [birth year] (56 y.o. [name] Primary Care Provider: Karle Plumber Other Clinician: Referring Provider: Treating Provider/Extender: Sammuel Bailiff in Treatment: 7 Procedure Performed for: Wound #1 Sacrum Performed By: Physician Kalman Shan, DO Post Procedure Diagnosis Same as Pre-procedure Electronic Signature(s) Signed: 03/14/2021 10:40:17 AM By: Kalman Shan DO Signed: 03/14/2021 5:00:00 PM By: Lorrin Jackson Entered By: Lorrin Jackson on 03/14/2021 09:30:46 -------------------------------------------------------------------------------- Physical Exam Details Patient Name: Date of  Service: [name] IN D. 03/14/2021 8:45 A M Medical Record Number: [id number] Patient Account Number: [id number] Date of Birth/Sex: Treating RN: [birth year] (56 y.o. M) Primary Care Provider: Karle Plumber Other Clinician: Referring Provider: Treating Provider/Extender: Sammuel Bailiff in Treatment: 7 Constitutional respirations regular, non-labored and within target range for patient.Marland Kitchen Psychiatric pleasant and cooperative. Notes Sacral ulcer with hyper granulated tissue throughout. Tunneling at the 12 o'clock position. No signs of infection. Electronic Signature(s) Signed: 03/14/2021 10:40:17 AM By: Kalman Shan DO Entered By: Kalman Shan on 03/14/2021 10:37:26 -------------------------------------------------------------------------------- Physician Orders Details Patient Name: Date of Service: [name], [name] IN D. 03/14/2021 8:45 A M Medical Record Number: [id number] Patient Account Number: [id number] Date of Birth/Sex: Treating RN: Nov 10, 1965 (56 y.o. [name] Primary Care Provider: Karle Plumber Other Clinician: Referring Provider: Treating Provider/Extender: Sammuel Bailiff in Treatment: 7 Verbal / Phone Orders: No Diagnosis Coding ICD-10 Coding Code Description L89.154 Pressure ulcer of sacral region, stage 4 M46.28 Osteomyelitis of vertebra, sacral and sacrococcygeal region I69.359 Hemiplegia and hemiparesis following cerebral infarction affecting unspecified side E11.65 Type 2 diabetes mellitus with hyperglycemia Follow-up  Appointments ppointment in 2 weeks. - Dr. Heber [place] (Room 7, [facility]) Return A Off-Loading Wound #1 Sacrum Low air-loss mattress (Group 2) - continue to use. Roho cushion for wheelchair Turn and reposition every 2 hours Other: - continue to use specialty mattress. minimize in wheelchair to help aid in healing pressure wound. no pressure to sacral area to prevent worsening. Home Health Wound #1 Sacrum No change in wound care orders this week; continue Home Health for wound care. May utilize formulary equivalent dressing for wound treatment orders unless otherwise specified. Other Home Health Orders/Instructions: - [facility] Wound Treatment Wound #1 - Sacrum Cleanser: Wound Cleanser ([place]) 1 x Per Day/30 Days Discharge Instructions: Cleanse the wound with wound cleanser prior to applying a clean dressing using gauze sponges, not tissue or cotton balls. Peri-Wound Care: Skin Prep (Home Health) 1 x Per Day/30 Days Discharge Instructions: Use skin prep as directed Prim Dressing: Dakin's Solution moisten gauze ([place]) 1 x Per Day/30 Days ary Discharge Instructions: Dakin's moisten gauze directly to undermining and wound bed. Secondary Dressing: Woven Gauze Sponges 2x2 in (Home Health) 1 x Per Day/30 Days Discharge Instructions: Apply over primary dressing as directed. Secondary Dressing: Zetuvit Plus Silicone Border Dressing 4x4 (in/in) (Home Health) 1 x Per Day/30 Days Discharge Instructions: Apply silicone border over primary dressing as directed. Electronic Signature(s) Signed: 03/14/2021 10:40:17 AM By: Kalman Shan DO Entered By: Kalman Shan on 03/14/2021 10:37:40 -------------------------------------------------------------------------------- Problem List Details Patient Name: Date of Service: [name], [name] IN D. 03/14/2021 8:45 A M Medical Record Number: [id number] Patient Account Number: [id number] Date of Birth/Sex: Treating RN: 22-Nov-1965 (56 y.o. [name] Primary Care Provider: Karle Plumber Other Clinician: Referring Provider: Treating Provider/Extender: Sammuel Bailiff in Treatment: 7 Active Problems ICD-10 Encounter Code Description Active Date MDM Diagnosis L89.154 Pressure ulcer of sacral region, stage 4 02/14/2021 No Yes M46.28 Osteomyelitis of vertebra, sacral and sacrococcygeal region 02/14/2021 No Yes I69.359 Hemiplegia and hemiparesis following cerebral infarction affecting unspecified 01/18/2021 No Yes side E11.65 Type 2 diabetes mellitus with hyperglycemia 01/18/2021 No Yes Inactive Problems Resolved Problems Electronic Signature(s) Signed: 03/14/2021 10:40:17 AM By: Kalman Shan DO Entered By: Kalman Shan on 03/14/2021 10:35:24 -------------------------------------------------------------------------------- Progress Note Details Patient Name: Date of Service: [name] IN D. 03/14/2021 8:45 A M Medical Record Number: [id number] Patient Account Number:  [id number] Date of Birth/Sex: Treating RN: 1965/12/28 (56 y.o. M) Primary Care Provider: Karle Plumber Other Clinician: Referring Provider: Treating Provider/Extender: Sammuel Bailiff in Treatment: 7 Subjective Chief Complaint Information obtained from Patient Sacral ulcer History of Present Illness (HPI) Admission 01/18/2021 [name] is a 56 year old male with a past medical history of CVA with left-sided hemiparesis, language and speech deficits, hypertension, and seizure disorder that presents the clinic for a 30-month history of sacral ulcer. He was hospitalized on 11/23/2020 for new onset A. fib and GI bleed. He states that during this hospitalization he developed a pressure ulcer to his sacrum. He reports using Santyl to the wound bed twice daily. He has a group 2 air mattress and states he has been offloading the area by staying on his side. He currently denies signs of infection.  He reports improvement in wound healing over the past month. 1/19; patient presents for follow-up. His pharmacy has ordered Dakin's but is not available yet to the patient. He has continued with Santyl daily. He has no issues or complaints today. He denies signs of infection. 1/26; patient presents for follow-up. He has been using Hydrofera Blue to the wound beds. He has no issues or complaints today. He denies signs of infection. 2/2; patient presents for follow-up. He has been using Hydrofera Blue to the wound beds. He has no issues or complaints today. He had an x-ray that showed new mild cortical erosion at the junction of the first and second coccygeal segments concerning for acute osteomyelitis. He currently denies systemic signs of infection. 2/16; patient presents for follow-up. He continues to use Dakin's wet-to-dry dressings without issues. He saw Dr. Tommy Medal on 02/19/2021. At that time he had recommended IV antibiotics for 6 weeks. Patient states that after the office visit he reconsidered doing a PICC line. He would like to try oral antibiotics instead. He currently denies signs of infection. He is trying to aggressively offload the area. 3/2; patient presents for follow-up. He continues to take Augmentin and doxycycline prescribed by infectious disease, Dr. Drucilla Schmidt for his sacral osteomyelitis. He currently denies signs of infection. He has been using Dakin's wet-to-dry dressings. Patient History Information obtained from Patient, Chart. Family History Unknown History. Social History Never smoker, Marital Status - Single, Alcohol Use - Never, Drug Use - Prior History, Caffeine Use - Rarely. Medical History Eyes Denies history of Cataracts, Glaucoma, Optic Neuritis Ear/Nose/Mouth/Throat Denies history of Chronic sinus problems/congestion, Middle ear problems Respiratory Denies history of Aspiration, Asthma, Chronic Obstructive Pulmonary Disease (COPD), Pneumothorax, Sleep Apnea,  Tuberculosis Cardiovascular Patient has history of Hypertension Gastrointestinal Denies history of Cirrhosis , Colitis, Crohnoos, Hepatitis A, Hepatitis B, Hepatitis C Endocrine Patient has history of Type II Diabetes Denies history of Type I Diabetes Musculoskeletal Denies history of Gout, Rheumatoid Arthritis, Osteoarthritis, Osteomyelitis Neurologic Patient has history of Paraplegia, Seizure Disorder Denies history of Dementia, Neuropathy, Quadriplegia Medical A Surgical History Notes nd Genitourinary CKD stage 2 Objective Constitutional respirations regular, non-labored and within target range for patient.. Vitals Time Taken: 9:11 AM, Height: 64 in, Temperature: 99.1 F, Pulse: 80 bpm, Respiratory Rate: 18 breaths/min, Blood Pressure: 130/76 mmHg. Psychiatric pleasant and cooperative. General Notes: Sacral ulcer with hyper granulated tissue throughout. Tunneling at the 12 o'clock position. No signs of infection. Integumentary (Hair, Skin) Wound #1 status is Open. Original cause of wound was Pressure Injury. The date acquired was: 11/13/2020. The wound has been in treatment 7 weeks. The wound is located on the Sacrum. The  wound measures 1.2cm length x 1cm width x 0.4cm depth; 0.942cm^2 area and 0.377cm^3 volume. There is Fat Layer (Subcutaneous Tissue) exposed. There is no tunneling noted, however, there is undermining starting at 12:00 and ending at 3:00 with a maximum distance of 0.5cm. There is a medium amount of serosanguineous drainage noted. The wound margin is distinct with the outline attached to the wound base. There is large (67-100%) red, pink, hyper - granulation within the wound bed. There is no necrotic tissue within the wound bed. Assessment Active Problems ICD-10 Pressure ulcer of sacral region, stage 4 Osteomyelitis of vertebra, sacral and sacrococcygeal region Hemiplegia and hemiparesis following cerebral infarction affecting unspecified side Type 2 diabetes  mellitus with hyperglycemia Patient's wound has shown improvement in size and appearance since last clinic visit. He has hyper granulated tissue that obstructing of the opening. I used silver nitrate to this area. I recommended continuing Dakin's wet-to-dry and aggressive offloading. He continues to take Augmentin and doxycycline without issues for his osteomyelitis. He has been on this for 2 weeks. Follow-up in 2 weeks. Procedures Wound #1 Pre-procedure diagnosis of Wound #1 is a Pressure Ulcer located on the Sacrum . An Chemical Cauterization procedure was performed by Kalman Shan, DO. Post procedure Diagnosis Wound #1: Same as Pre-Procedure Plan Follow-up Appointments: Return Appointment in 2 weeks. - Dr. Heber [place] (Room 7, [facility]) Off-Loading: Wound #1 Sacrum: Low air-loss mattress (Group 2) - continue to use. Roho cushion for wheelchair Turn and reposition every 2 hours Other: - continue to use specialty mattress. minimize in wheelchair to help aid in healing pressure wound. no pressure to sacral area to prevent worsening. Home Health: Wound #1 Sacrum: No change in wound care orders this week; continue Home Health for wound care. May utilize formulary equivalent dressing for wound treatment orders unless otherwise specified. Other Home Health Orders/Instructions: - [facility] WOUND #1: - Sacrum Wound Laterality: Cleanser: Wound Cleanser (Home Health) 1 x Per Day/30 Days Discharge Instructions: Cleanse the wound with wound cleanser prior to applying a clean dressing using gauze sponges, not tissue or cotton balls. Peri-Wound Care: Skin Prep (Home Health) 1 x Per Day/30 Days Discharge Instructions: Use skin prep as directed Prim Dressing: Dakin's Solution moisten gauze ([place]) 1 x Per Day/30 Days ary Discharge Instructions: Dakin's moisten gauze directly to undermining and wound bed. Secondary Dressing: Woven Gauze Sponges 2x2 in (Home Health) 1 x Per Day/30 Days Discharge  Instructions: Apply over primary dressing as directed. Secondary Dressing: Zetuvit Plus Silicone Border Dressing 4x4 (in/in) (Home Health) 1 x Per Day/30 Days Discharge Instructions: Apply silicone border over primary dressing as directed. 1. Silver nitrate 2. Dakin's wet-to-dry 3. Aggressive offloading 4. Continue antibiotics per ID 5. Follow-up in 1 week Electronic Signature(s) Signed: 03/14/2021 10:40:17 AM By: Kalman Shan DO Entered By: Kalman Shan on 03/14/2021 10:39:30 -------------------------------------------------------------------------------- HxROS Details Patient Name: Date of Service: [name], [name] IN D. 03/14/2021 8:45 A M Medical Record Number: [id number] Patient Account Number: [id number] Date of Birth/Sex: Treating RN: Oct 06, 1965 (56 y.o. M) Primary Care Provider: Karle Plumber Other Clinician: Referring Provider: Treating Provider/Extender: Sammuel Bailiff in Treatment: 7 Information Obtained From Patient Chart Eyes Medical History: Negative for: Cataracts; Glaucoma; Optic Neuritis Ear/Nose/Mouth/Throat Medical History: Negative for: Chronic sinus problems/congestion; Middle ear problems Respiratory Medical History: Negative for: Aspiration; Asthma; Chronic Obstructive Pulmonary Disease (COPD); Pneumothorax; Sleep Apnea; Tuberculosis Cardiovascular Medical History: Positive for: Hypertension Gastrointestinal Medical History: Negative for: Cirrhosis ; Colitis; Crohns; Hepatitis A; Hepatitis B; Hepatitis C  Endocrine Medical History: Positive for: Type II Diabetes Negative for: Type I Diabetes Genitourinary Medical History: Past Medical History Notes: CKD stage 2 Musculoskeletal Medical History: Negative for: Gout; Rheumatoid Arthritis; Osteoarthritis; Osteomyelitis Neurologic Medical History: Positive for: Paraplegia; Seizure Disorder Negative for: Dementia; Neuropathy; Quadriplegia Immunizations Pneumococcal  Vaccine: Received Pneumococcal Vaccination: Yes Received Pneumococcal Vaccination On or After 60th Birthday: Yes Implantable Devices None Family and Social History Unknown History: Yes; Never smoker; Marital Status - Single; Alcohol Use: Never; Drug Use: Prior History; Caffeine Use: Rarely; Financial Concerns: No; Food, Clothing or Shelter Needs: No; Support System Lacking: No; Transportation Concerns: No Electronic Signature(s) Signed: 03/14/2021 10:40:17 AM By: Kalman Shan DO Entered By: Kalman Shan on 03/14/2021 10:36:50 -------------------------------------------------------------------------------- SuperBill Details Patient Name: Date of Service: [name], [place] D. 03/14/2021 Medical Record Number: [id number] Patient Account Number: [id number] Date of Birth/Sex: Treating RN: 1965/02/13 (56 y.o. [name] Primary Care Provider: Karle Plumber Other Clinician: Referring Provider: Treating Provider/Extender: Sammuel Bailiff in Treatment: 7 Diagnosis Coding ICD-10 Codes Code Description [id number] Pressure ulcer of sacral region, stage 4 M46.28 Osteomyelitis of vertebra, sacral and sacrococcygeal region I69.359 Hemiplegia and hemiparesis following cerebral infarction affecting unspecified side E11.65 Type 2 diabetes mellitus with hyperglycemia Facility Procedures CPT4 Code: [id number] 1 Description: 4235 - CHEM CAUT GRANULATION TISS ICD-10 Diagnosis Description L89.154 Pressure ulcer of sacral region, stage 4 Modifier: Quantity: 1 Physician Procedures : CPT4 Code Description Modifier [id number] - WC PHYS CHEM CAUT GRAN TISSUE ICD-10 Diagnosis Description L89.154 Pressure ulcer of sacral region, stage 4 Quantity: 1 Electronic Signature(s) Signed: 03/14/2021 10:40:17 AM By: Kalman Shan DO Entered By: Kalman Shan on 03/14/2021 10:39:57

## 2021-03-15 ENCOUNTER — Inpatient Hospital Stay: Payer: BC Managed Care – PPO | Admitting: Physical Medicine and Rehabilitation

## 2021-03-19 DIAGNOSIS — I700 Atherosclerosis of aorta: Secondary | ICD-10-CM | POA: Diagnosis not present

## 2021-03-19 DIAGNOSIS — M109 Gout, unspecified: Secondary | ICD-10-CM | POA: Diagnosis not present

## 2021-03-19 DIAGNOSIS — I4891 Unspecified atrial fibrillation: Secondary | ICD-10-CM | POA: Diagnosis not present

## 2021-03-19 DIAGNOSIS — I69328 Other speech and language deficits following cerebral infarction: Secondary | ICD-10-CM | POA: Diagnosis not present

## 2021-03-19 DIAGNOSIS — Z79891 Long term (current) use of opiate analgesic: Secondary | ICD-10-CM | POA: Diagnosis not present

## 2021-03-19 DIAGNOSIS — L89894 Pressure ulcer of other site, stage 4: Secondary | ICD-10-CM | POA: Diagnosis not present

## 2021-03-19 DIAGNOSIS — E785 Hyperlipidemia, unspecified: Secondary | ICD-10-CM | POA: Diagnosis not present

## 2021-03-19 DIAGNOSIS — I69322 Dysarthria following cerebral infarction: Secondary | ICD-10-CM | POA: Diagnosis not present

## 2021-03-19 DIAGNOSIS — D509 Iron deficiency anemia, unspecified: Secondary | ICD-10-CM | POA: Diagnosis not present

## 2021-03-19 DIAGNOSIS — Z9089 Acquired absence of other organs: Secondary | ICD-10-CM | POA: Diagnosis not present

## 2021-03-19 DIAGNOSIS — Z9049 Acquired absence of other specified parts of digestive tract: Secondary | ICD-10-CM | POA: Diagnosis not present

## 2021-03-19 DIAGNOSIS — E669 Obesity, unspecified: Secondary | ICD-10-CM | POA: Diagnosis not present

## 2021-03-19 DIAGNOSIS — I10 Essential (primary) hypertension: Secondary | ICD-10-CM | POA: Diagnosis not present

## 2021-03-19 DIAGNOSIS — E119 Type 2 diabetes mellitus without complications: Secondary | ICD-10-CM | POA: Diagnosis not present

## 2021-03-19 DIAGNOSIS — I69354 Hemiplegia and hemiparesis following cerebral infarction affecting left non-dominant side: Secondary | ICD-10-CM | POA: Diagnosis not present

## 2021-03-19 DIAGNOSIS — G40909 Epilepsy, unspecified, not intractable, without status epilepticus: Secondary | ICD-10-CM | POA: Diagnosis not present

## 2021-03-20 DIAGNOSIS — M109 Gout, unspecified: Secondary | ICD-10-CM | POA: Diagnosis not present

## 2021-03-20 DIAGNOSIS — D509 Iron deficiency anemia, unspecified: Secondary | ICD-10-CM | POA: Diagnosis not present

## 2021-03-20 DIAGNOSIS — E119 Type 2 diabetes mellitus without complications: Secondary | ICD-10-CM | POA: Diagnosis not present

## 2021-03-20 DIAGNOSIS — Z79891 Long term (current) use of opiate analgesic: Secondary | ICD-10-CM | POA: Diagnosis not present

## 2021-03-20 DIAGNOSIS — Z9089 Acquired absence of other organs: Secondary | ICD-10-CM | POA: Diagnosis not present

## 2021-03-20 DIAGNOSIS — E785 Hyperlipidemia, unspecified: Secondary | ICD-10-CM | POA: Diagnosis not present

## 2021-03-20 DIAGNOSIS — G40909 Epilepsy, unspecified, not intractable, without status epilepticus: Secondary | ICD-10-CM | POA: Diagnosis not present

## 2021-03-20 DIAGNOSIS — E669 Obesity, unspecified: Secondary | ICD-10-CM | POA: Diagnosis not present

## 2021-03-20 DIAGNOSIS — I10 Essential (primary) hypertension: Secondary | ICD-10-CM | POA: Diagnosis not present

## 2021-03-20 DIAGNOSIS — I700 Atherosclerosis of aorta: Secondary | ICD-10-CM | POA: Diagnosis not present

## 2021-03-20 DIAGNOSIS — I69354 Hemiplegia and hemiparesis following cerebral infarction affecting left non-dominant side: Secondary | ICD-10-CM | POA: Diagnosis not present

## 2021-03-20 DIAGNOSIS — I69322 Dysarthria following cerebral infarction: Secondary | ICD-10-CM | POA: Diagnosis not present

## 2021-03-20 DIAGNOSIS — I4891 Unspecified atrial fibrillation: Secondary | ICD-10-CM | POA: Diagnosis not present

## 2021-03-20 DIAGNOSIS — I69328 Other speech and language deficits following cerebral infarction: Secondary | ICD-10-CM | POA: Diagnosis not present

## 2021-03-20 DIAGNOSIS — L89894 Pressure ulcer of other site, stage 4: Secondary | ICD-10-CM | POA: Diagnosis not present

## 2021-03-20 DIAGNOSIS — Z9049 Acquired absence of other specified parts of digestive tract: Secondary | ICD-10-CM | POA: Diagnosis not present

## 2021-03-22 ENCOUNTER — Other Ambulatory Visit: Payer: Self-pay | Admitting: Physical Medicine and Rehabilitation

## 2021-03-25 ENCOUNTER — Ambulatory Visit: Payer: BC Managed Care – PPO | Admitting: Infectious Disease

## 2021-03-26 DIAGNOSIS — I10 Essential (primary) hypertension: Secondary | ICD-10-CM | POA: Diagnosis not present

## 2021-03-26 DIAGNOSIS — I69322 Dysarthria following cerebral infarction: Secondary | ICD-10-CM | POA: Diagnosis not present

## 2021-03-26 DIAGNOSIS — Z9049 Acquired absence of other specified parts of digestive tract: Secondary | ICD-10-CM | POA: Diagnosis not present

## 2021-03-26 DIAGNOSIS — E785 Hyperlipidemia, unspecified: Secondary | ICD-10-CM | POA: Diagnosis not present

## 2021-03-26 DIAGNOSIS — I69354 Hemiplegia and hemiparesis following cerebral infarction affecting left non-dominant side: Secondary | ICD-10-CM | POA: Diagnosis not present

## 2021-03-26 DIAGNOSIS — E119 Type 2 diabetes mellitus without complications: Secondary | ICD-10-CM | POA: Diagnosis not present

## 2021-03-26 DIAGNOSIS — E669 Obesity, unspecified: Secondary | ICD-10-CM | POA: Diagnosis not present

## 2021-03-26 DIAGNOSIS — G40909 Epilepsy, unspecified, not intractable, without status epilepticus: Secondary | ICD-10-CM | POA: Diagnosis not present

## 2021-03-26 DIAGNOSIS — Z79891 Long term (current) use of opiate analgesic: Secondary | ICD-10-CM | POA: Diagnosis not present

## 2021-03-26 DIAGNOSIS — I4891 Unspecified atrial fibrillation: Secondary | ICD-10-CM | POA: Diagnosis not present

## 2021-03-26 DIAGNOSIS — M109 Gout, unspecified: Secondary | ICD-10-CM | POA: Diagnosis not present

## 2021-03-26 DIAGNOSIS — I700 Atherosclerosis of aorta: Secondary | ICD-10-CM | POA: Diagnosis not present

## 2021-03-26 DIAGNOSIS — Z9089 Acquired absence of other organs: Secondary | ICD-10-CM | POA: Diagnosis not present

## 2021-03-26 DIAGNOSIS — I69328 Other speech and language deficits following cerebral infarction: Secondary | ICD-10-CM | POA: Diagnosis not present

## 2021-03-26 DIAGNOSIS — L89894 Pressure ulcer of other site, stage 4: Secondary | ICD-10-CM | POA: Diagnosis not present

## 2021-03-26 DIAGNOSIS — D509 Iron deficiency anemia, unspecified: Secondary | ICD-10-CM | POA: Diagnosis not present

## 2021-03-28 ENCOUNTER — Encounter (HOSPITAL_BASED_OUTPATIENT_CLINIC_OR_DEPARTMENT_OTHER): Payer: BC Managed Care – PPO | Admitting: Internal Medicine

## 2021-03-28 ENCOUNTER — Other Ambulatory Visit: Payer: Self-pay

## 2021-03-28 DIAGNOSIS — I69354 Hemiplegia and hemiparesis following cerebral infarction affecting left non-dominant side: Secondary | ICD-10-CM | POA: Diagnosis not present

## 2021-03-28 DIAGNOSIS — M4628 Osteomyelitis of vertebra, sacral and sacrococcygeal region: Secondary | ICD-10-CM | POA: Diagnosis not present

## 2021-03-28 DIAGNOSIS — Z9181 History of falling: Secondary | ICD-10-CM | POA: Diagnosis not present

## 2021-03-28 DIAGNOSIS — L89154 Pressure ulcer of sacral region, stage 4: Secondary | ICD-10-CM | POA: Diagnosis not present

## 2021-03-28 DIAGNOSIS — I69398 Other sequelae of cerebral infarction: Secondary | ICD-10-CM | POA: Diagnosis not present

## 2021-03-28 DIAGNOSIS — Z993 Dependence on wheelchair: Secondary | ICD-10-CM | POA: Diagnosis not present

## 2021-03-28 DIAGNOSIS — E1165 Type 2 diabetes mellitus with hyperglycemia: Secondary | ICD-10-CM | POA: Diagnosis not present

## 2021-03-28 NOTE — Progress Notes (Signed)
JAQUANE, BOUGHNER ([id number]) ?Visit Report for 03/28/2021 ?Chief Complaint Document Details ?Patient Name: Date of Service: ?[name], [name]. 03/28/2021 8:45 A M ?Medical Record Number: [id number] ?Patient Account Number: [id number] ?Date of Birth/Sex: Treating RN: ?30-May-1965 (56 y.o. M) ?Primary Care Provider: Karle Plumber Other Clinician: ?Referring Provider: ?Treating Provider/Extender: Kalman Shan ?Karle Plumber ?Weeks in Treatment: 9 ?Information Obtained from: Patient ?Chief Complaint ?Sacral ulcer ?Electronic Signature(s) ?Signed: 03/28/2021 11:21:32 AM By: Kalman Shan DO ?Entered By: Kalman Shan on 03/28/2021 11:16:54 ?-------------------------------------------------------------------------------- ?HPI Details ?Patient Name: Date of Service: ?[name], [name]. 03/28/2021 8:45 A M ?Medical Record Number: [id number] ?Patient Account Number: [id number] ?Date of Birth/Sex: Treating RN: ?16-Mar-1965 (56 y.o. M) ?Primary Care Provider: Karle Plumber Other Clinician: ?Referring Provider: ?Treating Provider/Extender: Kalman Shan ?Karle Plumber ?Weeks in Treatment: 9 ?History of Present Illness ?HPI Description: Admission 01/18/2021 ?[name] is a 56 year old male with a past medical history of CVA with left-sided hemiparesis, language and speech deficits, hypertension, and ?seizure disorder that presents the clinic for a 82-monthhistory of sacral ulcer. He was hospitalized on 11/23/2020 for new onset A. fib and GI bleed. He states ?that during this hospitalization he developed a pressure ulcer to his sacrum. He reports using Santyl to the wound bed twice daily. He has a group 2 air mattress ?and states he has been offloading the area by staying on his side. He currently denies signs of infection. He reports improvement in wound healing over the ?past month. ?1/19; patient presents for follow-up. His pharmacy has ordered Dakin's but is not available yet to the patient. He  has continued with Santyl daily. He has no ?issues or complaints today. He denies signs of infection. ?1/26; patient presents for follow-up. He has been using Hydrofera Blue to the wound beds. He has no issues or complaints today. He denies signs of infection. ?2/2; patient presents for follow-up. He has been using Hydrofera Blue to the wound beds. He has no issues or complaints today. He had an x-ray that showed ?new mild cortical erosion at the junction of the first and second coccygeal segments concerning for acute osteomyelitis. He currently denies systemic signs of ?infection. ?2/16; patient presents for follow-up. He continues to use Dakin's wet-to-dry dressings without issues. He saw Dr. VTommy Medalon 02/19/2021. At that time he had ?recommended IV antibiotics for 6 weeks. Patient states that after the office visit he reconsidered doing a PICC line. He would like to try oral antibiotics ?instead. He currently denies signs of infection. He is trying to aggressively offload the area. ?3/2; patient presents for follow-up. He continues to take Augmentin and doxycycline prescribed by infectious disease, Dr. VDrucilla Schmidtfor his sacral osteomyelitis. ?He currently denies signs of infection. He has been using Dakin's wet-to-dry dressings. ?3/16; patient presents for follow-up. He has no issues or complaints today. He is still on Augmentin and doxycycline for his sacral osteomyelitis. He has been ?using Dakin's wet-to-dry dressings. ?Electronic Signature(s) ?Signed: 03/28/2021 11:21:32 AM By: HKalman ShanDO ?Signed: 03/28/2021 11:21:32 AM By: HKalman ShanDO ?Entered By: HKalman Shanon 03/28/2021 11:17:50 ?-------------------------------------------------------------------------------- ?Chemical Cauterization Details ?Patient Name: Date of Service: ?[name], [name]. 03/28/2021 8:45 A M ?Medical Record Number: [id number]?Patient Account Number: [id number]?Date of Birth/Sex: Treating RN: ?211-01-1965(56y.o. MMarcheta Grammes?Primary Care Provider: JKarle PlumberOther Clinician: ?Referring Provider: ?Treating Provider/Extender: HKalman Shan?JKarle Plumber?Weeks in Treatment: 9 ?Procedure Performed for: Wound #1 Sacrum ?Performed By: Physician HKalman Shan DO ?  Post Procedure Diagnosis ?Same as Pre-procedure ?Electronic Signature(s) ?Signed: 03/28/2021 11:21:32 AM By: Kalman Shan DO ?Signed: 03/28/2021 6:23:55 PM By: Lorrin Jackson ?Entered By: Lorrin Jackson on 03/28/2021 24:40:10 ?-------------------------------------------------------------------------------- ?Physical Exam Details ?Patient Name: Date of Service: ?[name], [name]. 03/28/2021 8:45 A M ?Medical Record Number: [id number] ?Patient Account Number: [id number] ?Date of Birth/Sex: Treating RN: ?03-Dec-1965 (56 y.o. M) ?Primary Care Provider: Karle Plumber Other Clinician: ?Referring Provider: ?Treating Provider/Extender: Kalman Shan ?Karle Plumber ?Weeks in Treatment: 9 ?Constitutional ?respirations regular, non-labored and within target range for patient.Marland Kitchen ?Psychiatric ?pleasant and cooperative. ?Notes ?Sacral ulcer with hyper granulated tissue throughout. No tunneling appreciated. No signs of surrounding infection. ?Electronic Signature(s) ?Signed: 03/28/2021 11:21:32 AM By: Kalman Shan DO ?Entered By: Kalman Shan on 03/28/2021 11:18:23 ?-------------------------------------------------------------------------------- ?Physician Orders Details ?Patient Name: Date of Service: ?[name], [name]. 03/28/2021 8:45 A M ?Medical Record Number: [id number] ?Patient Account Number: [id number] ?Date of Birth/Sex: Treating RN: ?1965/04/27 (56 y.o. [name] ?Primary Care Provider: Karle Plumber ?Other Clinician: ?Referring Provider: ?Treating Provider/Extender: Kalman Shan ?Karle Plumber ?Weeks in Treatment: 9 ?Verbal / Phone Orders: No ?Diagnosis Coding ?Follow-up Appointments ?ppointment in 2 weeks. - Dr. Heber Cache  (Room 7, [facility]) ?Return A ?Off-Loading ?Wound #1 Sacrum ?Low air-loss mattress (Group 2) - continue to use. ?Roho cushion for wheelchair ?Turn and reposition every 2 hours ?Other: - continue to use specialty mattress. ?minimize in wheelchair to help aid in healing pressure wound. ?no pressure to sacral area to prevent worsening. ?Home Health ?Wound #1 Sacrum ?No change in wound care orders this week; continue Home Health for wound care. May utilize formulary equivalent dressing for wound ?treatment orders unless otherwise specified. ?Other Home Health Orders/Instructions: [facility] ?Wound Treatment ?Wound #1 - Sacrum ?Cleanser: Wound Cleanser (Home Health) 1 x Per Day/30 Days ?Discharge Instructions: Cleanse the wound with wound cleanser prior to applying a clean dressing using gauze sponges, not tissue or cotton balls. ?Peri-Wound Care: Skin Prep (Home Health) 1 x Per Day/30 Days ?Discharge Instructions: Use skin prep as directed ?Prim Dressing: Dakin's Solution moisten gauze (Home Health) 1 x Per Day/30 Days ?ary ?Discharge Instructions: Dakin's moisten gauze directly to undermining and wound bed. ?Secondary Dressing: Woven Gauze Sponges 2x2 in (Home Health) 1 x Per Day/30 Days ?Discharge Instructions: Apply over primary dressing as directed. ?Secondary Dressing: Zetuvit Plus Silicone Border Dressing 4x4 (in/in) (Home Health) 1 x Per Day/30 Days ?Discharge Instructions: Apply silicone border over primary dressing as directed. ?Electronic Signature(s) ?Signed: 03/28/2021 11:21:32 AM By: Kalman Shan DO ?Entered By: Kalman Shan on 03/28/2021 11:18:44 ?-------------------------------------------------------------------------------- ?Problem List Details ?Patient Name: ?Date of Service: ?[name], [name]. 03/28/2021 8:45 A M ?Medical Record Number: [id number] ?Patient Account Number: [id number] ?Date of Birth/Sex: ?Treating RN: ?24-Jul-1965 (56 y.o. M) ?Primary Care Provider: Karle Plumber ?Other  Clinician: ?Referring Provider: ?Treating Provider/Extender: Kalman Shan ?Karle Plumber ?Weeks in Treatment: 9 ?Active Problems ?ICD-10 ?Encounter ?Code Description Active Date MDM ?Diagnosis ?I95.188 Pr

## 2021-03-28 NOTE — Progress Notes (Signed)
Mark, [name] ([id number]) ?Visit Report for 03/28/2021 ?Arrival Information Details ?Patient Name: Date of Service: ?[name], KELV IN D. 03/28/2021 8:45 A M ?Medical Record Number: [id number] ?Patient Account Number: [id number] ?Date of Birth/Sex: Treating RN: ?04/02/1965 (56 y.o. [name] ?Primary Care Craven Crean: Karle Plumber Other Clinician: ?Referring Ladale Sherburn: ?Treating Shaine Mount/Extender: Kalman Shan ?Karle Plumber ?Weeks in Treatment: 9 ?Visit Information History Since Last Visit ?Added or deleted any medications: No ?Patient Arrived: Wheel Chair ?Any new allergies or adverse reactions: No ?Arrival Time: 09:17 ?Had a fall or experienced change in No ?Transfer Assistance: None ?activities of daily living that may affect ?Patient Identification Verified: Yes ?risk of falls: ?Secondary Verification Process Completed: Yes ?Signs or symptoms of abuse/neglect since last visito No ?Patient Requires Transmission-Based Precautions: No ?Hospitalized since last visit: No ?Patient Has Alerts: No ?Implantable device outside of the clinic excluding No ?cellular tissue based products placed in the center ?since last visit: ?Has Dressing in Place as Prescribed: Yes ?Pain Present Now: No ?Electronic Signature(s) ?Signed: 03/28/2021 5:06:01 PM By: Sharyn Creamer RN, BSN ?Entered By: Sharyn Creamer on 03/28/2021 09:20:19 ?-------------------------------------------------------------------------------- ?Encounter Discharge Information Details ?Patient Name: Date of Service: ?[name], KELV IN D. 03/28/2021 8:45 A M ?Medical Record Number: [id number] ?Patient Account Number: [id number] ?Date of Birth/Sex: Treating RN: ?Jun 15, 1965 (56 y.o. [name] ?Primary Care Jadarian Mckay: Karle Plumber Other Clinician: ?Referring Kryssa Risenhoover: ?Treating Jeromiah Ohalloran/Extender: Kalman Shan ?Karle Plumber ?Weeks in Treatment: 9 ?Encounter Discharge Information Items ?Discharge Condition: Stable ?Ambulatory Status:  Wheelchair ?Discharge Destination: Home ?Transportation: Private Auto ?Schedule Follow-up Appointment: Yes ?Clinical Summary of Care: Provided on 03/28/2021 ?Form Type Recipient ?Paper Patient Patient ?Electronic Signature(s) ?Signed: 03/28/2021 10:03:16 AM By: Lorrin Jackson ?Entered By: Lorrin Jackson on 03/28/2021 10:03:15 ?-------------------------------------------------------------------------------- ?Lower Extremity Assessment Details ?Patient Name: ?Date of Service: ?[name], KELV IN D. 03/28/2021 8:45 A M ?Medical Record Number: [id number] ?Patient Account Number: [id number] ?Date of Birth/Sex: ?Treating RN: ?1965/12/21 (56 y.o. [name] ?Primary Care Yomira Flitton: Karle Plumber ?Other Clinician: ?Referring Askia Hazelip: ?Treating Cassondra Stachowski/Extender: Kalman Shan ?Karle Plumber ?Weeks in Treatment: 9 ?Electronic Signature(s) ?Signed: 03/28/2021 5:06:01 PM By: Sharyn Creamer RN, BSN ?Entered By: Sharyn Creamer on 03/28/2021 09:22:13 ?-------------------------------------------------------------------------------- ?Multi Wound Chart Details ?Patient Name: ?Date of Service: ?[name], KELV IN D. 03/28/2021 8:45 A M ?Medical Record Number: [id number] ?Patient Account Number: [id number] ?Date of Birth/Sex: ?Treating RN: ?10-25-1965 (56 y.o. M) ?Primary Care Fynley Chrystal: Karle Plumber ?Other Clinician: ?Referring Karman Veney: ?Treating Tony Granquist/Extender: Kalman Shan ?Karle Plumber ?Weeks in Treatment: 9 ?Vital Signs ?Height(in): 64 ?Pulse(bpm): 82 ?Weight(lbs): ?Blood Pressure(mmHg): 135/84 ?Body Mass Index(BMI): ?Temperature(??F): 98.2 ?Respiratory Rate(breaths/min): 18 ?Photos: [N/A:N/A] ?Sacrum N/A N/A ?Wound Location: ?Pressure Injury N/A N/A ?Wounding Event: ?Pressure Ulcer N/A N/A ?Primary Etiology: ?Hypertension, Type II Diabetes, N/A N/A ?Comorbid History: ?Paraplegia, Seizure Disorder ?11/13/2020 N/A N/A ?Date Acquired: ?48 N/A N/A ?Weeks of Treatment: ?Open N/A N/A ?Wound Status: ?No N/A N/A ?Wound  Recurrence: ?1x0.8x0.2 N/A N/A ?Measurements L x W x D (cm) ?0.628 N/A N/A ?A (cm?) : ?rea ?0.126 N/A N/A ?Volume (cm?) : ?90.90% N/A N/A ?% Reduction in A rea: ?98.90% N/A N/A ?% Reduction in Volume: ?Category/Stage IV N/A N/A ?Classification: ?Medium N/A N/A ?Exudate A mount: ?Serosanguineous N/A N/A ?Exudate Type: ?red, brown N/A N/A ?Exudate Color: ?Distinct, outline attached N/A N/A ?Wound Margin: ?Large (67-100%) N/A N/A ?Granulation A mount: ?Red, Pink, Hyper-granulation N/A N/A ?Granulation Quality: ?None Present (0%) N/A N/A ?Necrotic A mount: ?Fat Layer (Subcutaneous Tissue): Yes N/A N/A ?Exposed Structures: ?Fascia: No ?Tendon: No ?Muscle:  No ?Joint: No ?Bone: No ?Medium (34-66%) N/A N/A ?Epithelialization: ?Chemical Cauterization N/A N/A ?Procedures Performed: ?Treatment Notes ?Wound #1 (Sacrum) ?Cleanser ?Wound Cleanser ?Discharge Instruction: Cleanse the wound with wound cleanser prior to applying a clean dressing using gauze sponges, not tissue or cotton balls. ?Peri-Wound Care ?Skin Prep ?Discharge Instruction: Use skin prep as directed ?Topical ?Primary Dressing ?Dakin's Solution moisten gauze ?Discharge Instruction: Dakin's moisten gauze directly to undermining and wound bed. ?Secondary Dressing ?Woven Gauze Sponges 2x2 in ?Discharge Instruction: Apply over primary dressing as directed. ?Zetuvit Plus Silicone Border Dressing 4x4 (in/in) ?Discharge Instruction: Apply silicone border over primary dressing as directed. ?Secured With ?Compression Wrap ?Compression Stockings ?Add-Ons ?Electronic Signature(s) ?Signed: 03/28/2021 11:21:32 AM By: Kalman Shan DO ?Entered By: Kalman Shan on 03/28/2021 11:16:44 ?-------------------------------------------------------------------------------- ?Multi-Disciplinary Care Plan Details ?Patient Name: ?Date of Service: ?[name], KELV IN D. 03/28/2021 8:45 A M ?Medical Record Number: [id number] ?Patient Account Number: [id number] ?Date of Birth/Sex: ?Treating  RN: ?1965-02-09 (56 y.o. [name] ?Primary Care Nikhil Osei: Karle Plumber ?Other Clinician: ?Referring Andrea Ferrer: ?Treating Mikesha Migliaccio/Extender: Kalman Shan ?Karle Plumber ?Weeks in Treatment: 9 ?Active Inactive ?Abuse / Safety / Falls / Self Care Management ?Nursing Diagnoses: ?Potential for falls ?Potential for injury related to falls ?Goals: ?Patient will remain injury free related to falls ?Date Initiated: 01/18/2021 ?Target Resolution Date: 04/04/2021 ?Goal Status: Active ?Patient/caregiver will verbalize/demonstrate measure taken to improve self care ?Date Initiated: 01/18/2021 ?Target Resolution Date: 04/04/2021 ?Goal Status: Active ?Interventions: ?Assess: immobility, friction, shearing, incontinence upon admission and as needed ?Assess self care needs on admission and as needed ?Notes: ?Pressure ?Nursing Diagnoses: ?Knowledge deficit related to causes and risk factors for pressure ulcer development ?Potential for impaired tissue integrity related to pressure, friction, moisture, and shear ?Goals: ?Patient will remain free from development of additional pressure ulcers ?Date Initiated: 01/18/2021 ?Target Resolution Date: 04/04/2021 ?Goal Status: Active ?Patient/caregiver will verbalize risk factors for pressure ulcer development ?Date Initiated: 01/18/2021 ?Target Resolution Date: 04/04/2021 ?Goal Status: Active ?Interventions: ?Assess: immobility, friction, shearing, incontinence upon admission and as needed ?Assess offloading mechanisms upon admission and as needed ?Assess potential for pressure ulcer upon admission and as needed ?Provide education on pressure ulcers ?Treatment Activities: ?Patient referred for home evaluation of offloading devices/mattresses : 01/18/2021 ?Patient referred for pressure reduction/relief devices : 01/18/2021 ?Pressure reduction/relief device ordered : 01/18/2021 ?Notes: ?Wound/Skin Impairment ?Nursing Diagnoses: ?Knowledge deficit related to ulceration/compromised skin  integrity ?Goals: ?Patient/caregiver will verbalize understanding of skin care regimen ?Date Initiated: 01/18/2021 ?Target Resolution Date: 04/04/2021 ?Goal Status: Active ?Interventions: ?Assess patient/caregiver abil

## 2021-04-01 ENCOUNTER — Telehealth: Payer: Self-pay | Admitting: Internal Medicine

## 2021-04-01 DIAGNOSIS — E669 Obesity, unspecified: Secondary | ICD-10-CM | POA: Diagnosis not present

## 2021-04-01 DIAGNOSIS — D509 Iron deficiency anemia, unspecified: Secondary | ICD-10-CM | POA: Diagnosis not present

## 2021-04-01 DIAGNOSIS — I10 Essential (primary) hypertension: Secondary | ICD-10-CM | POA: Diagnosis not present

## 2021-04-01 DIAGNOSIS — I69328 Other speech and language deficits following cerebral infarction: Secondary | ICD-10-CM | POA: Diagnosis not present

## 2021-04-01 DIAGNOSIS — L89894 Pressure ulcer of other site, stage 4: Secondary | ICD-10-CM | POA: Diagnosis not present

## 2021-04-01 DIAGNOSIS — E785 Hyperlipidemia, unspecified: Secondary | ICD-10-CM | POA: Diagnosis not present

## 2021-04-01 DIAGNOSIS — Z79891 Long term (current) use of opiate analgesic: Secondary | ICD-10-CM | POA: Diagnosis not present

## 2021-04-01 DIAGNOSIS — Z9089 Acquired absence of other organs: Secondary | ICD-10-CM | POA: Diagnosis not present

## 2021-04-01 DIAGNOSIS — I69354 Hemiplegia and hemiparesis following cerebral infarction affecting left non-dominant side: Secondary | ICD-10-CM | POA: Diagnosis not present

## 2021-04-01 DIAGNOSIS — E119 Type 2 diabetes mellitus without complications: Secondary | ICD-10-CM | POA: Diagnosis not present

## 2021-04-01 DIAGNOSIS — M109 Gout, unspecified: Secondary | ICD-10-CM | POA: Diagnosis not present

## 2021-04-01 DIAGNOSIS — I700 Atherosclerosis of aorta: Secondary | ICD-10-CM | POA: Diagnosis not present

## 2021-04-01 DIAGNOSIS — I4891 Unspecified atrial fibrillation: Secondary | ICD-10-CM | POA: Diagnosis not present

## 2021-04-01 DIAGNOSIS — I69322 Dysarthria following cerebral infarction: Secondary | ICD-10-CM | POA: Diagnosis not present

## 2021-04-01 DIAGNOSIS — G40909 Epilepsy, unspecified, not intractable, without status epilepticus: Secondary | ICD-10-CM | POA: Diagnosis not present

## 2021-04-01 DIAGNOSIS — Z9049 Acquired absence of other specified parts of digestive tract: Secondary | ICD-10-CM | POA: Diagnosis not present

## 2021-04-01 NOTE — Telephone Encounter (Signed)
Princess Tamala Julian calling from [facility] PT is calling to report that the pt reported a fall on Friday. EMS came to assist the patient up. Left Knee Pain. Managed well with pain medication. Will continue PT and fall prevention training. ? ?CB- [phone] ?

## 2021-04-02 DIAGNOSIS — D509 Iron deficiency anemia, unspecified: Secondary | ICD-10-CM | POA: Diagnosis not present

## 2021-04-02 DIAGNOSIS — Z9089 Acquired absence of other organs: Secondary | ICD-10-CM | POA: Diagnosis not present

## 2021-04-02 DIAGNOSIS — E785 Hyperlipidemia, unspecified: Secondary | ICD-10-CM | POA: Diagnosis not present

## 2021-04-02 DIAGNOSIS — L89894 Pressure ulcer of other site, stage 4: Secondary | ICD-10-CM | POA: Diagnosis not present

## 2021-04-02 DIAGNOSIS — I69354 Hemiplegia and hemiparesis following cerebral infarction affecting left non-dominant side: Secondary | ICD-10-CM | POA: Diagnosis not present

## 2021-04-02 DIAGNOSIS — I700 Atherosclerosis of aorta: Secondary | ICD-10-CM | POA: Diagnosis not present

## 2021-04-02 DIAGNOSIS — Z9049 Acquired absence of other specified parts of digestive tract: Secondary | ICD-10-CM | POA: Diagnosis not present

## 2021-04-02 DIAGNOSIS — I69328 Other speech and language deficits following cerebral infarction: Secondary | ICD-10-CM | POA: Diagnosis not present

## 2021-04-02 DIAGNOSIS — I69322 Dysarthria following cerebral infarction: Secondary | ICD-10-CM | POA: Diagnosis not present

## 2021-04-02 DIAGNOSIS — E669 Obesity, unspecified: Secondary | ICD-10-CM | POA: Diagnosis not present

## 2021-04-02 DIAGNOSIS — I10 Essential (primary) hypertension: Secondary | ICD-10-CM | POA: Diagnosis not present

## 2021-04-02 DIAGNOSIS — M109 Gout, unspecified: Secondary | ICD-10-CM | POA: Diagnosis not present

## 2021-04-02 DIAGNOSIS — I4891 Unspecified atrial fibrillation: Secondary | ICD-10-CM | POA: Diagnosis not present

## 2021-04-02 DIAGNOSIS — E119 Type 2 diabetes mellitus without complications: Secondary | ICD-10-CM | POA: Diagnosis not present

## 2021-04-02 DIAGNOSIS — Z79891 Long term (current) use of opiate analgesic: Secondary | ICD-10-CM | POA: Diagnosis not present

## 2021-04-02 DIAGNOSIS — G40909 Epilepsy, unspecified, not intractable, without status epilepticus: Secondary | ICD-10-CM | POA: Diagnosis not present

## 2021-04-02 NOTE — Telephone Encounter (Signed)
Will forward to patient's PCP so she is aware.  ?

## 2021-04-05 ENCOUNTER — Telehealth: Payer: Self-pay | Admitting: Internal Medicine

## 2021-04-05 DIAGNOSIS — L89894 Pressure ulcer of other site, stage 4: Secondary | ICD-10-CM | POA: Diagnosis not present

## 2021-04-05 DIAGNOSIS — E119 Type 2 diabetes mellitus without complications: Secondary | ICD-10-CM | POA: Diagnosis not present

## 2021-04-05 DIAGNOSIS — Z9049 Acquired absence of other specified parts of digestive tract: Secondary | ICD-10-CM | POA: Diagnosis not present

## 2021-04-05 DIAGNOSIS — G40909 Epilepsy, unspecified, not intractable, without status epilepticus: Secondary | ICD-10-CM | POA: Diagnosis not present

## 2021-04-05 DIAGNOSIS — I4891 Unspecified atrial fibrillation: Secondary | ICD-10-CM | POA: Diagnosis not present

## 2021-04-05 DIAGNOSIS — E785 Hyperlipidemia, unspecified: Secondary | ICD-10-CM | POA: Diagnosis not present

## 2021-04-05 DIAGNOSIS — I10 Essential (primary) hypertension: Secondary | ICD-10-CM | POA: Diagnosis not present

## 2021-04-05 DIAGNOSIS — D509 Iron deficiency anemia, unspecified: Secondary | ICD-10-CM | POA: Diagnosis not present

## 2021-04-05 DIAGNOSIS — Z9089 Acquired absence of other organs: Secondary | ICD-10-CM | POA: Diagnosis not present

## 2021-04-05 DIAGNOSIS — I69328 Other speech and language deficits following cerebral infarction: Secondary | ICD-10-CM | POA: Diagnosis not present

## 2021-04-05 DIAGNOSIS — Z79891 Long term (current) use of opiate analgesic: Secondary | ICD-10-CM | POA: Diagnosis not present

## 2021-04-05 DIAGNOSIS — I69322 Dysarthria following cerebral infarction: Secondary | ICD-10-CM | POA: Diagnosis not present

## 2021-04-05 DIAGNOSIS — M109 Gout, unspecified: Secondary | ICD-10-CM | POA: Diagnosis not present

## 2021-04-05 DIAGNOSIS — I700 Atherosclerosis of aorta: Secondary | ICD-10-CM | POA: Diagnosis not present

## 2021-04-05 DIAGNOSIS — I69354 Hemiplegia and hemiparesis following cerebral infarction affecting left non-dominant side: Secondary | ICD-10-CM | POA: Diagnosis not present

## 2021-04-05 DIAGNOSIS — E669 Obesity, unspecified: Secondary | ICD-10-CM | POA: Diagnosis not present

## 2021-04-05 NOTE — Telephone Encounter (Signed)
Home Health Verbal Orders - Caller/Agency: [facility] [place] / [facility]  ?Callback Number: [phone]/ vm can be left  ?Requesting OT ?Frequency: for re cert visit 1x a week for 1 week  ?

## 2021-04-05 NOTE — Telephone Encounter (Signed)
Request sent to patient's PCP.  ?

## 2021-04-07 ENCOUNTER — Other Ambulatory Visit: Payer: Self-pay | Admitting: Infectious Disease

## 2021-04-08 NOTE — Telephone Encounter (Signed)
Please advise on refill request.  ?Pt does not have follow up appt scheduled.  ?

## 2021-04-08 NOTE — Telephone Encounter (Signed)
Returned Stephanie call and provided verbal orders  

## 2021-04-10 ENCOUNTER — Encounter
Payer: BC Managed Care – PPO | Attending: Physical Medicine and Rehabilitation | Admitting: Physical Medicine and Rehabilitation

## 2021-04-10 DIAGNOSIS — R252 Cramp and spasm: Secondary | ICD-10-CM | POA: Insufficient documentation

## 2021-04-10 DIAGNOSIS — L89150 Pressure ulcer of sacral region, unstageable: Secondary | ICD-10-CM | POA: Insufficient documentation

## 2021-04-10 DIAGNOSIS — I69359 Hemiplegia and hemiparesis following cerebral infarction affecting unspecified side: Secondary | ICD-10-CM | POA: Insufficient documentation

## 2021-04-10 DIAGNOSIS — Z5181 Encounter for therapeutic drug level monitoring: Secondary | ICD-10-CM | POA: Insufficient documentation

## 2021-04-10 DIAGNOSIS — I69398 Other sequelae of cerebral infarction: Secondary | ICD-10-CM | POA: Insufficient documentation

## 2021-04-10 DIAGNOSIS — G894 Chronic pain syndrome: Secondary | ICD-10-CM | POA: Insufficient documentation

## 2021-04-10 DIAGNOSIS — Z79891 Long term (current) use of opiate analgesic: Secondary | ICD-10-CM | POA: Insufficient documentation

## 2021-04-10 DIAGNOSIS — I69328 Other speech and language deficits following cerebral infarction: Secondary | ICD-10-CM | POA: Insufficient documentation

## 2021-04-12 ENCOUNTER — Telehealth: Payer: Self-pay | Admitting: Pharmacist

## 2021-04-12 NOTE — Telephone Encounter (Signed)
Patient's spouse [name] called about medication refills. Reviewed that they were overdue for an appointment with Dr. Tommy Medal; they missed his 3/13 visit. We rescheduled to 4/4. He will finish out both doxycycline and Augmentin today. Stated that you would review with them need to continue antibiotics or not and that it was okay to miss a few days in between until his visit. ? ?Alfonse Spruce, PharmD, CPP ?Clinical Pharmacist Practitioner ?Infectious Diseases Clinical Pharmacist ?[facility] ? ?

## 2021-04-14 DIAGNOSIS — L89300 Pressure ulcer of unspecified buttock, unstageable: Secondary | ICD-10-CM | POA: Diagnosis not present

## 2021-04-15 ENCOUNTER — Telehealth: Payer: Self-pay | Admitting: Internal Medicine

## 2021-04-15 DIAGNOSIS — E119 Type 2 diabetes mellitus without complications: Secondary | ICD-10-CM | POA: Diagnosis not present

## 2021-04-15 DIAGNOSIS — Z9049 Acquired absence of other specified parts of digestive tract: Secondary | ICD-10-CM | POA: Diagnosis not present

## 2021-04-15 DIAGNOSIS — G40909 Epilepsy, unspecified, not intractable, without status epilepticus: Secondary | ICD-10-CM | POA: Diagnosis not present

## 2021-04-15 DIAGNOSIS — E669 Obesity, unspecified: Secondary | ICD-10-CM | POA: Diagnosis not present

## 2021-04-15 DIAGNOSIS — I700 Atherosclerosis of aorta: Secondary | ICD-10-CM | POA: Diagnosis not present

## 2021-04-15 DIAGNOSIS — Z79891 Long term (current) use of opiate analgesic: Secondary | ICD-10-CM | POA: Diagnosis not present

## 2021-04-15 DIAGNOSIS — I4891 Unspecified atrial fibrillation: Secondary | ICD-10-CM | POA: Diagnosis not present

## 2021-04-15 DIAGNOSIS — I69322 Dysarthria following cerebral infarction: Secondary | ICD-10-CM | POA: Diagnosis not present

## 2021-04-15 DIAGNOSIS — I480 Paroxysmal atrial fibrillation: Secondary | ICD-10-CM

## 2021-04-15 DIAGNOSIS — I69354 Hemiplegia and hemiparesis following cerebral infarction affecting left non-dominant side: Secondary | ICD-10-CM | POA: Diagnosis not present

## 2021-04-15 DIAGNOSIS — I69328 Other speech and language deficits following cerebral infarction: Secondary | ICD-10-CM | POA: Diagnosis not present

## 2021-04-15 DIAGNOSIS — L89894 Pressure ulcer of other site, stage 4: Secondary | ICD-10-CM | POA: Diagnosis not present

## 2021-04-15 DIAGNOSIS — I10 Essential (primary) hypertension: Secondary | ICD-10-CM | POA: Diagnosis not present

## 2021-04-15 DIAGNOSIS — D509 Iron deficiency anemia, unspecified: Secondary | ICD-10-CM | POA: Diagnosis not present

## 2021-04-15 DIAGNOSIS — M109 Gout, unspecified: Secondary | ICD-10-CM | POA: Diagnosis not present

## 2021-04-15 DIAGNOSIS — E785 Hyperlipidemia, unspecified: Secondary | ICD-10-CM | POA: Diagnosis not present

## 2021-04-15 DIAGNOSIS — Z9089 Acquired absence of other organs: Secondary | ICD-10-CM | POA: Diagnosis not present

## 2021-04-15 NOTE — Telephone Encounter (Signed)
Will forward to provider  

## 2021-04-15 NOTE — Telephone Encounter (Signed)
Call returned  to patient's wife, [name],  to follow up on request for home xray and inquire which part of patient's right leg hurts. . Message left with call back requested to this CM.  ? ?Call placed to Freddie Breech, [place] # [phone] to inquire about the process they use for ordering home xrays. Message left with call back requested to this CM ?

## 2021-04-15 NOTE — Telephone Encounter (Signed)
Copied from [place] [id number]. Topic: General - Other ?>> Apr 12, 2021 10:11 AM Yvette Rack wrote: ?Reason for CRM: Pt wife stated pt had a fall on 03/29/21 and he would like to request authorization to have an in home x-ray done by [facility]. Pt wife stated pt hurt his left knee during the fall but now reports right leg is sore and unable to stand and put weight on it. ?

## 2021-04-16 ENCOUNTER — Other Ambulatory Visit: Payer: Self-pay

## 2021-04-16 ENCOUNTER — Ambulatory Visit (INDEPENDENT_AMBULATORY_CARE_PROVIDER_SITE_OTHER): Payer: BC Managed Care – PPO | Admitting: Infectious Disease

## 2021-04-16 ENCOUNTER — Encounter (HOSPITAL_BASED_OUTPATIENT_CLINIC_OR_DEPARTMENT_OTHER): Payer: BC Managed Care – PPO | Attending: Internal Medicine | Admitting: Internal Medicine

## 2021-04-16 VITALS — BP 142/96 | HR 90 | Temp 98.3°F

## 2021-04-16 DIAGNOSIS — E1165 Type 2 diabetes mellitus with hyperglycemia: Secondary | ICD-10-CM | POA: Diagnosis not present

## 2021-04-16 DIAGNOSIS — R252 Cramp and spasm: Secondary | ICD-10-CM

## 2021-04-16 DIAGNOSIS — I69354 Hemiplegia and hemiparesis following cerebral infarction affecting left non-dominant side: Secondary | ICD-10-CM | POA: Insufficient documentation

## 2021-04-16 DIAGNOSIS — I10 Essential (primary) hypertension: Secondary | ICD-10-CM | POA: Diagnosis not present

## 2021-04-16 DIAGNOSIS — M869 Osteomyelitis, unspecified: Secondary | ICD-10-CM | POA: Diagnosis not present

## 2021-04-16 DIAGNOSIS — E1159 Type 2 diabetes mellitus with other circulatory complications: Secondary | ICD-10-CM | POA: Diagnosis not present

## 2021-04-16 DIAGNOSIS — E119 Type 2 diabetes mellitus without complications: Secondary | ICD-10-CM | POA: Diagnosis not present

## 2021-04-16 DIAGNOSIS — I69359 Hemiplegia and hemiparesis following cerebral infarction affecting unspecified side: Secondary | ICD-10-CM

## 2021-04-16 DIAGNOSIS — L89154 Pressure ulcer of sacral region, stage 4: Secondary | ICD-10-CM | POA: Diagnosis not present

## 2021-04-16 DIAGNOSIS — Z09 Encounter for follow-up examination after completed treatment for conditions other than malignant neoplasm: Secondary | ICD-10-CM | POA: Insufficient documentation

## 2021-04-16 DIAGNOSIS — I69398 Other sequelae of cerebral infarction: Secondary | ICD-10-CM

## 2021-04-16 DIAGNOSIS — M4628 Osteomyelitis of vertebra, sacral and sacrococcygeal region: Secondary | ICD-10-CM

## 2021-04-16 NOTE — Progress Notes (Signed)
[name], [name] ([id number]) ?Visit Report for 04/16/2021 ?Arrival Information Details ?Patient Name: Date of Service: ?[name], KELV IN D. 04/16/2021 8:45 A M ?Medical Record Number: [id number] ?Patient Account Number: [id number] ?Date of Birth/Sex: Treating RN: ?09-11-1965 (56 y.o. [name] ?Primary Care Areej Tayler: Karle Plumber Other Clinician: ?Referring Lyndel Dancel: ?Treating Bransyn Adami/Extender: Kalman Shan ?Karle Plumber ?Weeks in Treatment: 12 ?Visit Information History Since Last Visit ?Added or deleted any medications: No ?Patient Arrived: Wheel Chair ?Any new allergies or adverse reactions: No ?Arrival Time: 08:55 ?Had a fall or experienced change in No ?Accompanied By: wife ?activities of daily living that may affect ?Transfer Assistance: Manual ?risk of falls: ?Patient Identification Verified: Yes ?Signs or symptoms of abuse/neglect since last visito No ?Secondary Verification Process Completed: Yes ?Hospitalized since last visit: No ?Patient Requires Transmission-Based Precautions: No ?Implantable device outside of the clinic excluding No ?Patient Has Alerts: No ?cellular tissue based products placed in the center ?since last visit: ?Has Dressing in Place as Prescribed: Yes ?Pain Present Now: No ?Electronic Signature(s) ?Signed: 04/16/2021 4:45:45 PM By: Lorrin Jackson ?Entered By: Lorrin Jackson on 04/16/2021 08:56:27 ?-------------------------------------------------------------------------------- ?Clinic Level of Care Assessment Details ?Patient Name: Date of Service: ?[name], KELV IN D. 04/16/2021 8:45 A M ?Medical Record Number: [id number] ?Patient Account Number: [id number] ?Date of Birth/Sex: Treating RN: ?1965/08/09 (56 y.o. [name] ?Primary Care Ravynn Hogate: Karle Plumber Other Clinician: ?Referring Keyri Salberg: ?Treating Malyia Moro/Extender: Kalman Shan ?Karle Plumber ?Weeks in Treatment: 12 ?Clinic Level of Care Assessment Items ?TOOL 4 Quantity Score ?X- 1 0 ?Use when  only an EandM is performed on FOLLOW-UP visit ?ASSESSMENTS - Nursing Assessment / Reassessment ?X- 1 10 ?Reassessment of Co-morbidities (includes updates in patient status) ?X- 1 5 ?Reassessment of Adherence to Treatment Plan ?ASSESSMENTS - Wound and Skin A ssessment / Reassessment ?X - Simple Wound Assessment / Reassessment - one wound 1 5 ?'[]'$  - 0 ?Complex Wound Assessment / Reassessment - multiple wounds ?'[]'$  - 0 ?Dermatologic / Skin Assessment (not related to wound area) ?ASSESSMENTS - Focused Assessment ?'[]'$  - 0 ?Circumferential Edema Measurements - multi extremities ?'[]'$  - 0 ?Nutritional Assessment / Counseling / Intervention ?'[]'$  - 0 ?Lower Extremity Assessment (monofilament, tuning fork, pulses) ?'[]'$  - 0 ?Peripheral Arterial Disease Assessment (using hand held doppler) ?ASSESSMENTS - Ostomy and/or Continence Assessment and Care ?'[]'$  - 0 ?Incontinence Assessment and Management ?'[]'$  - 0 ?Ostomy Care Assessment and Management (repouching, etc.) ?PROCESS - Coordination of Care ?X - Simple Patient / Family Education for ongoing care 1 15 ?'[]'$  - 0 ?Complex (extensive) Patient / Family Education for ongoing care ?'[]'$  - 0 ?Staff obtains Consents, Records, T Results / Process Orders ?est ?'[]'$  - 0 ?Staff telephones HHA, Nursing Homes / Clarify orders / etc ?'[]'$  - 0 ?Routine Transfer to another Facility (non-emergent condition) ?'[]'$  - 0 ?Routine Hospital Admission (non-emergent condition) ?'[]'$  - 0 ?New Admissions / Biomedical engineer / Ordering NPWT Apligraf, etc. ?, ?'[]'$  - 0 ?Emergency Hospital Admission (emergent condition) ?X- 1 10 ?Simple Discharge Coordination ?'[]'$  - 0 ?Complex (extensive) Discharge Coordination ?PROCESS - Special Needs ?'[]'$  - 0 ?Pediatric / Minor Patient Management ?'[]'$  - 0 ?Isolation Patient Management ?'[]'$  - 0 ?Hearing / Language / Visual special needs ?'[]'$  - 0 ?Assessment of Community assistance (transportation, D/C planning, etc.) ?'[]'$  - 0 ?Additional assistance / Altered mentation ?'[]'$  - 0 ?Support  Surface(s) Assessment (bed, cushion, seat, etc.) ?INTERVENTIONS - Wound Cleansing / Measurement ?'[]'$  - 0 ?Simple Wound Cleansing - one wound ?'[]'$  - 0 ?Complex Wound Cleansing -  multiple wounds ?X- 1 5 ?Wound Imaging (photographs - any number of wounds) ?'[]'$  - 0 ?Wound Tracing (instead of photographs) ?'[]'$  - 0 ?Simple Wound Measurement - one wound ?'[]'$  - 0 ?Complex Wound Measurement - multiple wounds ?INTERVENTIONS - Wound Dressings ?'[]'$  - 0 ?Small Wound Dressing one or multiple wounds ?'[]'$  - 0 ?Medium Wound Dressing one or multiple wounds ?'[]'$  - 0 ?Large Wound Dressing one or multiple wounds ?'[]'$  - 0 ?Application of Medications - topical ?'[]'$  - 0 ?Application of Medications - injection ?INTERVENTIONS - Miscellaneous ?'[]'$  - 0 ?External ear exam ?'[]'$  - 0 ?Specimen Collection (cultures, biopsies, blood, body fluids, etc.) ?'[]'$  - 0 ?Specimen(s) / Culture(s) sent or taken to Lab for analysis ?'[]'$  - 0 ?Patient Transfer (multiple staff / Civil Service fast streamer / Similar devices) ?'[]'$  - 0 ?Simple Staple / Suture removal (25 or less) ?'[]'$  - 0 ?Complex Staple / Suture removal (26 or more) ?'[]'$  - 0 ?Hypo / Hyperglycemic Management (close monitor of Blood Glucose) ?'[]'$  - 0 ?Ankle / Brachial Index (ABI) - do not check if billed separately ?X- 1 5 ?Vital Signs ?Has the patient been seen at the hospital within the last three years: Yes ?Total Score: 55 ?Level Of Care: New/Established - Level 2 ?Electronic Signature(s) ?Signed: 04/16/2021 4:45:45 PM By: Lorrin Jackson ?Entered By: Lorrin Jackson on 04/16/2021 09:26:17 ?-------------------------------------------------------------------------------- ?Encounter Discharge Information Details ?Patient Name: Date of Service: ?[name], KELV IN D. 04/16/2021 8:45 A M ?Medical Record Number: [id number] ?Patient Account Number: [id number] ?Date of Birth/Sex: Treating RN: ?1965-05-21 (56 y.o. [name] ?Primary Care Skylee Baird: Karle Plumber Other Clinician: ?Referring Tallie Hevia: ?Treating Aleighya Mcanelly/Extender: Kalman Shan ?Karle Plumber ?Weeks in Treatment: 12 ?Encounter Discharge Information Items ?Discharge Condition: Stable ?Ambulatory Status: Wheelchair ?Discharge Destination: Home ?Transportation: Private Auto ?Accompanied By: spouse ?Schedule Follow-up Appointment: No ?Clinical Summary of Care: Provided on 04/16/2021 ?Form Type Recipient ?Paper Patient Patient ?Electronic Signature(s) ?Signed: 04/16/2021 4:45:45 PM By: Lorrin Jackson ?Entered By: Lorrin Jackson on 04/16/2021 09:32:28 ?-------------------------------------------------------------------------------- ?Lower Extremity Assessment Details ?Patient Name: Date of Service: ?[name], KELV IN D. 04/16/2021 8:45 A M ?Medical Record Number: [id number] ?Patient Account Number: [id number] ?Date of Birth/Sex: Treating RN: ?[birth year] (56 y.o. [name] ?Primary Care Samrat Hayward: Karle Plumber Other Clinician: ?Referring Zionna Homewood: ?Treating Primo Innis/Extender: Kalman Shan ?Karle Plumber ?Weeks in Treatment: 12 ?Electronic Signature(s) ?Signed: 04/16/2021 4:45:45 PM By: Lorrin Jackson ?Entered By: Lorrin Jackson on 04/16/2021 09:03:39 ?-------------------------------------------------------------------------------- ?Multi Wound Chart Details ?Patient Name: ?Date of Service: ?[name], KELV IN D. 04/16/2021 8:45 A M ?Medical Record Number: [id number] ?Patient Account Number: [id number] ?Date of Birth/Sex: ?Treating RN: ?06/07/1965 (56 y.o. [name] ?Primary Care Grover Woodfield: Karle Plumber ?Other Clinician: ?Referring Pinkie Manger: ?Treating Francelia Mclaren/Extender: Kalman Shan ?Karle Plumber ?Weeks in Treatment: 12 ?Vital Signs ?Height(in): 64 ?Pulse(bpm): 90 ?Weight(lbs): ?Blood Pressure(mmHg): 156/95 ?Body Mass Index(BMI): ?Temperature(??F): 99.3 ?Respiratory Rate(breaths/min): 22 ?Photos: [N/A:N/A] ?Sacrum N/A N/A ?Wound Location: ?Pressure Injury N/A N/A ?Wounding Event: ?Pressure Ulcer N/A N/A ?Primary Etiology: ?Hypertension, Type II Diabetes, N/A  N/A ?Comorbid History: ?Paraplegia, Seizure Disorder ?11/13/2020 N/A N/A ?Date Acquired: ?26 N/A N/A ?Weeks of Treatment: ?Healed - Epithelialized N/A N/A ?Wound Status: ?No N/A N/A ?Wound Recurrence: ?0x0x0 N/A N/A

## 2021-04-16 NOTE — Progress Notes (Signed)
RONITH, BERTI ([id number]) ?Visit Report for 04/16/2021 ?Chief Complaint Document Details ?Patient Name: Date of Service: ?WHITA KER, KELV IN D. 04/16/2021 8:45 A M ?Medical Record Number: [id number] ?Patient Account Number: [id number] ?Date of Birth/Sex: Treating RN: ?04/05/1965 (56 y.o. [name] ?Primary Care Provider: Karle Plumber Other Clinician: ?Referring Provider: ?Treating Provider/Extender: Kalman Shan ?Karle Plumber ?Weeks in Treatment: 12 ?Information Obtained from: Patient ?Chief Complaint ?Sacral ulcer ?Electronic Signature(s) ?Signed: 04/16/2021 10:22:07 AM By: Kalman Shan DO ?Entered By: Kalman Shan on 04/16/2021 10:17:20 ?-------------------------------------------------------------------------------- ?HPI Details ?Patient Name: Date of Service: ?WHITA KER, KELV IN D. 04/16/2021 8:45 A M ?Medical Record Number: [id number] ?Patient Account Number: [id number] ?Date of Birth/Sex: Treating RN: ?1965/12/15 (56 y.o. [name] ?Primary Care Provider: Karle Plumber Other Clinician: ?Referring Provider: ?Treating Provider/Extender: Kalman Shan ?Karle Plumber ?Weeks in Treatment: 12 ?History of Present Illness ?HPI Description: Admission 01/18/2021 ?Mr. Mohsin Crum is a 56 year old male with a past medical history of CVA with left-sided hemiparesis, language and speech deficits, hypertension, and ?seizure disorder that presents the clinic for a 65-monthhistory of sacral ulcer. He was hospitalized on 11/23/2020 for new onset A. fib and GI bleed. He states ?that during this hospitalization he developed a pressure ulcer to his sacrum. He reports using Santyl to the wound bed twice daily. He has a group 2 air mattress ?and states he has been offloading the area by staying on his side. He currently denies signs of infection. He reports improvement in wound healing over the ?past month. ?1/19; patient presents for follow-up. His pharmacy has ordered Dakin's but is not  available yet to the patient. He has continued with Santyl daily. He has no ?issues or complaints today. He denies signs of infection. ?1/26; patient presents for follow-up. He has been using Hydrofera Blue to the wound beds. He has no issues or complaints today. He denies signs of infection. ?2/2; patient presents for follow-up. He has been using Hydrofera Blue to the wound beds. He has no issues or complaints today. He had an x-ray that showed ?new mild cortical erosion at the junction of the first and second coccygeal segments concerning for acute osteomyelitis. He currently denies systemic signs of ?infection. ?2/16; patient presents for follow-up. He continues to use Dakin's wet-to-dry dressings without issues. He saw Dr. VTommy Medalon 02/19/2021. At that time he had ?recommended IV antibiotics for 6 weeks. Patient states that after the office visit he reconsidered doing a PICC line. He would like to try oral antibiotics ?instead. He currently denies signs of infection. He is trying to aggressively offload the area. ?3/2; patient presents for follow-up. He continues to take Augmentin and doxycycline prescribed by infectious disease, Dr. VDrucilla Schmidtfor his sacral osteomyelitis. ?He currently denies signs of infection. He has been using Dakin's wet-to-dry dressings. ?3/16; patient presents for follow-up. He has no issues or complaints today. He is still on Augmentin and doxycycline for his sacral osteomyelitis. He has been ?using Dakin's wet-to-dry dressings. ?4/4; patient presents for follow-up. He believes that the wound has healed. He has follow-up with Dr. VDrucilla Schmidttoday, infectious disease to discuss if antibiotics ?will be continued. He is currently on Augmentin and doxycycline for his sacral osteomyelitis. ?Electronic Signature(s) ?Signed: 04/16/2021 10:22:07 AM By: HKalman ShanDO ?Entered By: HKalman Shanon 04/16/2021  10:19:03 ?-------------------------------------------------------------------------------- ?Physical Exam Details ?Patient Name: Date of Service: ?WHITA KER, KELV IN D. 04/16/2021 8:45 A M ?Medical Record Number: [id number]?Patient Account Number: [id number]?Date of Birth/Sex: Treating RN: ?[birth year](56  y.o. Jerilynn Mages) Lorrin Jackson ?Primary Care Provider: Karle Plumber Other Clinician: ?Referring Provider: ?Treating Provider/Extender: Kalman Shan ?Karle Plumber ?Weeks in Treatment: 12 ?Constitutional ?respirations regular, non-labored and within target range for patient.Marland Kitchen ?Psychiatric ?pleasant and cooperative. ?Notes ?Epithelization to the previous wound site on the sacrum ?Electronic Signature(s) ?Signed: 04/16/2021 10:22:07 AM By: Kalman Shan DO ?Entered By: Kalman Shan on 04/16/2021 10:19:50 ?-------------------------------------------------------------------------------- ?Physician Orders Details ?Patient Name: Date of Service: ?WHITA KER, KELV IN D. 04/16/2021 8:45 A M ?Medical Record Number: [id number] ?Patient Account Number: [id number] ?Date of Birth/Sex: Treating RN: ?1965/08/04 (56 y.o. [name] ?Primary Care Provider: Karle Plumber Other Clinician: ?Referring Provider: ?Treating Provider/Extender: Kalman Shan ?Karle Plumber ?Weeks in Treatment: 12 ?Verbal / Phone Orders: No ?Diagnosis Coding ?ICD-10 Coding ?Code Description ?L89.154 Pressure ulcer of sacral region, stage 4 ?M46.28 Osteomyelitis of vertebra, sacral and sacrococcygeal region ?I69.359 Hemiplegia and hemiparesis following cerebral infarction affecting unspecified side ?E11.65 Type 2 diabetes mellitus with hyperglycemia ?[facility]charge From [facility] ?Discharge from [facility] - No further follow up needed, call if any changes. ?Non Wound Condition ?Other Non Wound Condition Orders/Instructions: - Monitor/Inspect area regularly ?Electronic Signature(s) ?Signed: 04/16/2021 10:22:07 AM By: Kalman Shan  DO ?Entered By: Kalman Shan on 04/16/2021 10:20:15 ?-------------------------------------------------------------------------------- ?Problem List Details ?Patient Name: Date of Service: ?WHITA KER, KELV IN D. 04/16/2021 8:45 A M ?Medical Record Number: [id number] ?Patient Account Number: [id number] ?Date of Birth/Sex: Treating RN: ?08-22-1965 (56 y.o. [name] ?Primary Care Provider: Karle Plumber Other Clinician: ?Referring Provider: ?Treating Provider/Extender: Kalman Shan ?Karle Plumber ?Weeks in Treatment: 12 ?Active Problems ?ICD-10 ?Encounter ?Code Description Active Date MDM ?Diagnosis ?L89.154 Pressure ulcer of sacral region, stage 4 02/14/2021 No Yes ?M46.28 Osteomyelitis of vertebra, sacral and sacrococcygeal region 02/14/2021 No Yes ?J00.938 Hemiplegia and hemiparesis following cerebral infarction affecting unspecified 01/18/2021 No Yes ?side ?E11.65 Type 2 diabetes mellitus with hyperglycemia 01/18/2021 No Yes ?Inactive Problems ?Resolved Problems ?Electronic Signature(s) ?Signed: 04/16/2021 10:22:07 AM By: Kalman Shan DO ?Entered By: Kalman Shan on 04/16/2021 10:16:54 ?-------------------------------------------------------------------------------- ?Progress Note Details ?Patient Name: Date of Service: ?WHITA KER, KELV IN D. 04/16/2021 8:45 A M ?Medical Record Number: [id number] ?Patient Account Number: [id number] ?Date of Birth/Sex: Treating RN: ?10/18/1965 (56 y.o. [name] ?Primary Care Provider: Karle Plumber Other Clinician: ?Referring Provider: ?Treating Provider/Extender: Kalman Shan ?Karle Plumber ?Weeks in Treatment: 12 ?Subjective ?Chief Complaint ?Information obtained from Patient ?Sacral ulcer ?History of Present Illness (HPI) ?Admission 01/18/2021 ?Mr. Nael Petrosyan is a 56 year old male with a past medical history of CVA with left-sided hemiparesis, language and speech deficits, hypertension, and ?seizure disorder that presents the clinic for a 106-month history of sacral ulcer. He was hospitalized on 11/23/2020 for new onset A. fib and GI bleed. He states ?that during this hospitalization he developed a pressure ulcer to his sacrum. He reports using Santyl to the wound bed twice daily. He has a group 2 air mattress ?and states he has been offloading the

## 2021-04-16 NOTE — Telephone Encounter (Signed)
Call placed to [facility]ree[facility], [place] # [phone] to inquire about the process they use for ordering home xrays. Message left with call back requested to this CM  ?

## 2021-04-16 NOTE — Progress Notes (Signed)
? ?Subjective:  ? ?Chief complaint: follow-up for sacrococygeal osteomyelitis ? ? Patient ID: [name], male    DOB: Jul 15, 1965, 56 y.o.   MRN: [id number] ? ?HPI ? ?[name] is a 56 year old black man with multiple medical problems including stroke with left-sided hemiparesis language and speech deficits hypertension seizure disorder who has been followed closely by Dr. Kalman Shan in the wound clinic.  He has had more than 68-monthhistory of a sacral ulcer.  He was hospitalized in November for new onset atrial fibrillation and also with a GI bleed.  Apparently during that hospitalization ~pressure ulcer in his sacral area. ? ?He recently had x-ray which showed some cortical erosion at the junction of the first and second coccygeal segments concerning for acute osteomyelitis in sacrococcygeal area. ? ?Dr. HHeber Carolinadid not find this was an area that she could biopsy for culture.  Patient was referred to u[place] consideration of empiric antibiotics to treat his osteomyelitis in his coccyx. ? ?He does have an air mattress at home and he and his wife who accompanies him to clinic are trying to the best they can to offload the area.  They say there is no purulent drainage and he is without fevers chills nausea malaise or other systemic symptoms to suggest systemic infection. ? ?He does not recall having a history of MRSA infection though we will certainly want to cover for MRSA and MR SE. ? ?We were initially planning on giving him parenteral therapy empirically but he and his wife eventually decided against that and we placed him on Augmentin and doxycycline instead which she has been on since the end of January through present date.  Has been following closely with Dr. HHeber Carolinaand apparently his wound is completely closed off and he has been discharged from her clinic to follow-up as needed. ? ? ? ? ? ?Past Medical History:  ?Diagnosis Date  ? Diabetes mellitus without complication ([facility]   ? Gait abnormality  03/31/2018  ? Hemiparesis and speech and language deficit as late effects of stroke ([facility] 03/31/2018  ? Hypertension   ? Osteomyelitis of coccyx ([facility] 02/19/2021  ? Rectal cancer ([facility] 2009  ? rectal  ? Sacral osteomyelitis ([facility] 02/19/2021  ? Seizure ([facility]   ? Stroke ([facility] 2018  ? ? ?Past Surgical History:  ?Procedure Laterality Date  ? APPENDECTOMY    ? BIOPSY  11/18/2020  ? Procedure: BIOPSY;  Surgeon: DSharyn Creamer MD;  [facility];  Service: Gastroenterology;;  ? COLONOSCOPY N/A 11/18/2020  ? Procedure: COLONOSCOPY;  Surgeon: DSharyn Creamer MD;  [place] [facility]  Service: Gastroenterology;  Laterality: N/A;  ? ESOPHAGOGASTRODUODENOSCOPY (EGD) WITH PROPOFOL N/A 11/18/2020  ? Procedure: ESOPHAGOGASTRODUODENOSCOPY (EGD) WITH PROPOFOL;  Surgeon: DSharyn Creamer MD;  [facility]  Service: Gastroenterology;  Laterality: N/A;  ? JOINT REPLACEMENT Left 2004  ? Left hip  ? POLYPECTOMY  11/18/2020  ? Procedure: POLYPECTOMY;  Surgeon: DSharyn Creamer MD;  [place] [facility] [facility]  Service: Gastroenterology;;  ? TOTAL HIP ARTHROPLASTY    ? ? ?Family History  ?Problem Relation Age of Onset  ? Hypertension Mother   ? Heart disease Mother   ? Hyperlipidemia Mother   ? Heart failure Mother   ? Hypertension Sister   ? Hypertension Brother   ? Stroke Father   ? Hypertension Father   ? Colon cancer Maternal Grandmother   ? Diabetes Maternal Grandmother   ? Esophageal cancer Neg Hx   ? ? ?  ?  Social History  ? ?Socioeconomic History  ? Marital status: Married  ?  Spouse name: [name]  ? Number of children: 1  ? Years of education: 63  ? Highest education level: Not on file  ?Occupational History  ? Occupation: disabled  ?Tobacco Use  ? Smoking status: Never  ? Smokeless tobacco: Never  ?Vaping Use  ? Vaping Use: Never used  ?Substance and Sexual Activity  ? Alcohol use: Not Currently  ? Drug use: No  ? Sexual activity: Not Currently  ?Other Topics Concern  ? Not on file  ?Social History Narrative  ? Patient  drinks caffeine a couple times a week.  ? Patient is right handed.  ? Lives with wife [name]) and daughter  ? ?Social Determinants of Health  ? ?Financial Resource Strain: Not on file  ?Food Insecurity: Not on file  ?Transportation Needs: Not on file  ?Physical Activity: Not on file  ?Stress: Not on file  ?Social Connections: Not on file  ? ? ?No Known Allergies ? ? ?Current Outpatient Medications:  ?  amiodarone (PACERONE) 200 MG tablet, TAKE 1 TABLET BY MOUTH EVERY DAY, Disp: 30 tablet, Rfl: 2 ?  atorvastatin (LIPITOR) 80 MG tablet, Take 1 tablet (80 mg total) by mouth daily., Disp: 90 tablet, Rfl: 1 ?  carvedilol (COREG) 12.5 MG tablet, Take 1 tablet (12.5 mg total) by mouth 2 (two) times daily with a meal., Disp: 60 tablet, Rfl: 3 ?  citalopram (CELEXA) 20 MG tablet, TAKE 1 TABLET BY MOUTH EVERY DAY, Disp: 90 tablet, Rfl: 1 ?  collagenase (SANTYL) ointment, Apply topically 2 (two) times daily. To yellow slough and cover with damp to dry dressing.  Change dressing twice a day (Patient not taking: Reported on 02/19/2021), Disp: 30 g, Rfl: 6 ?  diltiazem (CARDIZEM) 30 MG tablet, TAKE 1 TABLET (30 MG TOTAL) BY MOUTH EVERY 6 HOURS, Disp: 360 tablet, Rfl: 1 ?  doxycycline (VIBRA-TABS) 100 MG tablet, Take 1 tablet (100 mg total) by mouth 2 (two) times daily., Disp: 84 tablet, Rfl: 0 ?  ferrous sulfate 325 (65 FE) MG tablet, 1 tab PO Q Mon/Wed/Fri, Disp: 100 tablet, Rfl: 1 ?  meclizine (ANTIVERT) 12.5 MG tablet, TAKE 1-2 TABLETS (12.5-25 MG TOTAL) BY MOUTH 3 (THREE) TIMES DAILY AS NEEDED FOR DIZZINESS., Disp: 60 tablet, Rfl: 1 ?  morphine (MS CONTIN) 15 MG 12 hr tablet, Take 1 tablet (15 mg total) by mouth every 12 (twelve) hours., Disp: 60 tablet, Rfl: 0 ?  oxybutynin (DITROPAN) 5 MG tablet, TAKE 1 TABLET TWICE A DAY, Disp: 60 tablet, Rfl: 5 ?  oxyCODONE (OXY IR/ROXICODONE) 5 MG immediate release tablet, Take 1-2 tablets (5-10 mg total) by mouth every 6 (six) hours as needed for severe pain. (Patient not taking:  Reported on 02/19/2021), Disp: 160 tablet, Rfl: 0 ?  pantoprazole (PROTONIX) 40 MG tablet, Take 1 tablet (40 mg total) by mouth 2 (two) times daily., Disp: 60 tablet, Rfl: 11 ?  traZODone (DESYREL) 150 MG tablet, Take 1/2 tablet (75 mg total) by mouth at bedtime. (Patient not taking: Reported on 02/19/2021), Disp: 30 tablet, Rfl: 0 ? ? ? ?Review of Systems  ?Constitutional:  Negative for activity change, appetite change, chills, diaphoresis, fatigue, fever and unexpected weight change.  ?HENT:  Negative for congestion, rhinorrhea, sinus pressure, sneezing, sore throat and trouble swallowing.   ?Eyes:  Negative for photophobia and visual disturbance.  ?Respiratory:  Negative for cough, chest tightness, shortness of breath, wheezing and stridor.   ?Cardiovascular:  Negative for chest pain, palpitations and leg swelling.  ?Gastrointestinal:  Negative for abdominal distention, abdominal pain, anal bleeding, blood in stool, constipation, diarrhea, nausea and vomiting.  ?Genitourinary:  Negative for difficulty urinating, dysuria, flank pain and hematuria.  ?Musculoskeletal:  Negative for arthralgias, back pain, gait problem, joint swelling and myalgias.  ?Skin:  Negative for color change, pallor, rash and wound.  ?Neurological:  Positive for weakness. Negative for dizziness, tremors and light-headedness.  ?Hematological:  Negative for adenopathy. Does not bruise/bleed easily.  ?Psychiatric/Behavioral:  Negative for agitation, behavioral problems, confusion, decreased concentration, dysphoric mood and sleep disturbance.   ? ?   ?Objective:  ? Physical Exam ?Constitutional:   ?   Appearance: He is well-developed.  ?HENT:  ?   Head: Normocephalic and atraumatic.  ?Eyes:  ?   Conjunctiva/sclera: Conjunctivae normal.  ?Cardiovascular:  ?   Rate and Rhythm: Normal rate and regular rhythm.  ?Pulmonary:  ?   Effort: Pulmonary effort is normal. No respiratory distress.  ?   Breath sounds: No wheezing.  ?Abdominal:  ?   General: There  is no distension.  ?   Palpations: Abdomen is soft.  ?Musculoskeletal:     ?   General: No tenderness. Normal range of motion.  ?   Cervical back: Normal range of motion and neck supple.  ?Skin: ?   General

## 2021-04-17 ENCOUNTER — Encounter: Payer: Self-pay | Admitting: Neurology

## 2021-04-17 ENCOUNTER — Ambulatory Visit (INDEPENDENT_AMBULATORY_CARE_PROVIDER_SITE_OTHER): Payer: BC Managed Care – PPO | Admitting: Neurology

## 2021-04-17 VITALS — BP 129/84 | HR 80 | Ht 62.0 in

## 2021-04-17 DIAGNOSIS — R569 Unspecified convulsions: Secondary | ICD-10-CM | POA: Diagnosis not present

## 2021-04-17 DIAGNOSIS — Z6841 Body Mass Index (BMI) 40.0 and over, adult: Secondary | ICD-10-CM | POA: Diagnosis not present

## 2021-04-17 DIAGNOSIS — G9389 Other specified disorders of brain: Secondary | ICD-10-CM

## 2021-04-17 DIAGNOSIS — G8194 Hemiplegia, unspecified affecting left nondominant side: Secondary | ICD-10-CM | POA: Diagnosis not present

## 2021-04-17 LAB — CBC WITH DIFFERENTIAL/PLATELET
Absolute Monocytes: 500 cells/uL (ref 200–950)
Basophils Absolute: 18 cells/uL (ref 0–200)
Basophils Relative: 0.4 %
Eosinophils Absolute: 41 cells/uL (ref 15–500)
Eosinophils Relative: 0.9 %
HCT: 35.5 % — ABNORMAL LOW (ref 38.5–50.0)
Hemoglobin: 11.9 g/dL — ABNORMAL LOW (ref 13.2–17.1)
Lymphs Abs: 941 cells/uL (ref 850–3900)
MCH: 26.4 pg — ABNORMAL LOW (ref 27.0–33.0)
MCHC: 33.5 g/dL (ref 32.0–36.0)
MCV: 78.7 fL — ABNORMAL LOW (ref 80.0–100.0)
MPV: 10.8 fL (ref 7.5–12.5)
Monocytes Relative: 11.1 %
Neutro Abs: 3002 cells/uL (ref 1500–7800)
Neutrophils Relative %: 66.7 %
Platelets: 227 10*3/uL (ref 140–400)
RBC: 4.51 10*6/uL (ref 4.20–5.80)
RDW: 16.3 % — ABNORMAL HIGH (ref 11.0–15.0)
Total Lymphocyte: 20.9 %
WBC: 4.5 10*3/uL (ref 3.8–10.8)

## 2021-04-17 LAB — BASIC METABOLIC PANEL WITH GFR
BUN/Creatinine Ratio: 17 (calc) (ref 6–22)
BUN: 27 mg/dL — ABNORMAL HIGH (ref 7–25)
CO2: 25 mmol/L (ref 20–32)
Calcium: 9.5 mg/dL (ref 8.6–10.3)
Chloride: 107 mmol/L (ref 98–110)
Creat: 1.61 mg/dL — ABNORMAL HIGH (ref 0.70–1.30)
Glucose, Bld: 134 mg/dL — ABNORMAL HIGH (ref 65–99)
Potassium: 4.4 mmol/L (ref 3.5–5.3)
Sodium: 143 mmol/L (ref 135–146)
eGFR: 50 mL/min/{1.73_m2} — ABNORMAL LOW (ref >=60)

## 2021-04-17 LAB — C-REACTIVE PROTEIN: CRP: 87.4 mg/L — ABNORMAL HIGH (ref <8.0)

## 2021-04-17 LAB — SEDIMENTATION RATE: Sed Rate: 68 mm/h — ABNORMAL HIGH (ref 0–20)

## 2021-04-17 MED ORDER — CARVEDILOL 12.5 MG PO TABS: 12.5000 mg | ORAL_TABLET | Freq: Two times a day (BID) | ORAL | 6 refills | Status: DC

## 2021-04-17 NOTE — Telephone Encounter (Signed)
Call received from patient's wife,[name]. She explained that her husband initially fell on his left knee but his right leg was causing him pain.  The home health PT  suggested the in home  xray. [name] has been massaging his right thigh and alternating hot and cold compresses and now he does not have pain in either leg. She does not think an xray is necessary at this time.  He went to a neurology appointment today and had no complaint of pain with transfers.  ? ?[name]'s concern is his ability to independently transfer bed to chair safely.  He continues to work with home PT to address this issue.  ? ?[name] is requesting a refill of carvedilol.  She said that her pharmacy informed her that the provider has not authorized the refill yet. I told her that Dr Wynetta Emery will be notified.  ? ?I spoke to Freddie Breech, RN/ [facility] regarding mobile xray.  She explained that [facility] does not have a mobile x-ray machine. They contract with a company to provide the service.  ? ?

## 2021-04-17 NOTE — Telephone Encounter (Signed)
noted 

## 2021-04-17 NOTE — Progress Notes (Addendum)
? ? ?PATIENT: [name] ?DOB: 1965-01-31 ? ?REASON FOR VISIT: follow up with history of parafalcine meningioma and history of seizures ?HISTORY FROM: patient, wife  ?PRIMARY NEUROLOGIST: Dr. April Manson ? ?HISTORY OF PRESENT ILLNESS: ?Today 04/17/21 ?Shadd here today for follow-up.  Had MRI of the brain without contrast in November 2022, showed unchanged parafalcine calcified mass underlying gliosis.  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  Admitted in November 2022 for A-fib with RVR, post-fall.  GI work-up revealed many esophageal ulcers. Sacral ulcer developed. Did inpatient rehab. Finally discharged from wound care center yesterday wound is healed. No seizures since stopping Keppra in 2017, last was in 2016. Stroke in Nov 2018 with left sided hemiparesis, gait disorder. At 1 time doing botox injections to left arm, helped with more feeling, had more use of left hand. His wife still works full time. 2 weeks ago fell on left knee, but right side was sore. Wearing gait, can stand for transfers and pivots. Working with [facility] PT, working on Technical sales engineer, working on Civil Service fast streamer. Has hospital bed in living room.  ? ?Update 04/16/2020 SS: [name] is a 56 year old male with history of parafalcine meningioma and history of seizures.  He has been off [place] since 2017 without recurrence.  Had a stroke in 2018 resulting in significant gait disorder and left hemiparesis.  He was having Botox injections to the left arm, but these have been stopped patient choice, benefited with good range of motion, really no change since stopping, but left fingers are in flexion.  Was in the ER in April 2021 for right-sided weakness, felt as result of the Covid vaccinations, this resolved.  At the time had MRI of the brain, no acute findings, probable meningioma was stable.  They were to follow-up with neurosurgery, but has yet to do this.  In the past has seen Dr. Christella Noa.  [name] stays at home during the  day, his wife still works full-time.  Has not had any PT/OT since 2020, he was able to stand, starting to work with assistive device use.  Nothing since COVID.  Interested in getting back into PT/OT.  He is able to stand and pivot to transfer currently.  Otherwise is unable to ambulate.  He is able to do most of his ADLs on his own, can manage during the day while his wife works.  Here today for evaluation accompanied by his wife. ? ?HISTORY ?04/13/2019 Dr. Jannifer Franklin: [name] is a 56 year old right-handed black male with a history of a parafalcine meningioma, he will be having MRI of the brain next week to follow-up with this.  He has a history of seizures, he has been off of [place] since 2017 and has not had any recurrence of his seizures.  He does not operate a motor vehicle.  He has had a stroke in 2018 that left him with a significant gait disorder, and a left hemiparesis.  He can walk short distances with a cane, he gets Botox injections through Dr. Ella Bodo on the left arm.  The patient has not had any falls, he does occasionally have some difficulty with choking with swallowing.  He has not had any new medical issues that have come up since last seen. ?   ?REVIEW OF SYSTEMS: Out of a complete 14 system review of symptoms, the patient complains only of the following symptoms, and all other reviewed systems are negative. ? ?See HPI ? ?ALLERGIES: ?No Known Allergies ? ?HOME MEDICATIONS: ?Outpatient Medications Prior  to Visit  ?Medication Sig Dispense Refill  ? amiodarone (PACERONE) 200 MG tablet TAKE 1 TABLET BY MOUTH EVERY DAY 30 tablet 2  ? atorvastatin (LIPITOR) 80 MG tablet Take 1 tablet (80 mg total) by mouth daily. 90 tablet 1  ? carvedilol (COREG) 12.5 MG tablet Take 1 tablet (12.5 mg total) by mouth 2 (two) times daily with a meal. 60 tablet 3  ? citalopram (CELEXA) 20 MG tablet TAKE 1 TABLET BY MOUTH EVERY DAY 90 tablet 1  ? collagenase (SANTYL) ointment Apply topically 2 (two) times daily. To yellow  slough and cover with damp to dry dressing.  Change dressing twice a day 30 g 6  ? diltiazem (CARDIZEM) 30 MG tablet TAKE 1 TABLET (30 MG TOTAL) BY MOUTH EVERY 6 HOURS 360 tablet 1  ? doxycycline (VIBRA-TABS) 100 MG tablet Take 1 tablet (100 mg total) by mouth 2 (two) times daily. 84 tablet 0  ? ferrous sulfate 325 (65 FE) MG tablet 1 tab PO Q Mon/Wed/Fri 100 tablet 1  ? meclizine (ANTIVERT) 12.5 MG tablet TAKE 1-2 TABLETS (12.5-25 MG TOTAL) BY MOUTH 3 (THREE) TIMES DAILY AS NEEDED FOR DIZZINESS. 60 tablet 1  ? morphine (MS CONTIN) 15 MG 12 hr tablet Take 1 tablet (15 mg total) by mouth every 12 (twelve) hours. 60 tablet 0  ? oxybutynin (DITROPAN) 5 MG tablet TAKE 1 TABLET TWICE A DAY 60 tablet 5  ? oxyCODONE (OXY IR/ROXICODONE) 5 MG immediate release tablet Take 1-2 tablets (5-10 mg total) by mouth every 6 (six) hours as needed for severe pain. 160 tablet 0  ? pantoprazole (PROTONIX) 40 MG tablet Take 1 tablet (40 mg total) by mouth 2 (two) times daily. 60 tablet 11  ? traZODone (DESYREL) 150 MG tablet Take 1/2 tablet (75 mg total) by mouth at bedtime. 30 tablet 0  ? ?No facility-administered medications prior to visit.  ? ? ?PAST MEDICAL HISTORY: ?Past Medical History:  ?Diagnosis Date  ? Diabetes mellitus without complication ([place])   ? Gait abnormality 03/31/2018  ? Hemiparesis and speech and language deficit as late effects of stroke ([place]) 03/31/2018  ? Hypertension   ? Osteomyelitis of coccyx ([place]) 02/19/2021  ? Rectal cancer [facility]) 2009  ? rectal  ? Sacral osteomyelitis ([place]) 02/19/2021  ? Seizure ([place])   ? Stroke [facility]) 2018  ? ? ?PAST SURGICAL HISTORY: ?Past Surgical History:  ?Procedure Laterality Date  ? APPENDECTOMY    ? BIOPSY  11/18/2020  ? Procedure: BIOPSY;  Surgeon: Sharyn Creamer, MD;  Location: [facility];  Service: Gastroenterology;;  ? COLONOSCOPY N/A 11/18/2020  ? Procedure: COLONOSCOPY;  Surgeon: Sharyn Creamer, MD;  Location: [place];  Service: Gastroenterology;  Laterality: N/A;  ?  ESOPHAGOGASTRODUODENOSCOPY (EGD) WITH PROPOFOL N/A 11/18/2020  ? Procedure: ESOPHAGOGASTRODUODENOSCOPY (EGD) WITH PROPOFOL;  Surgeon: Sharyn Creamer, MD;  Location: [place];  Service: Gastroenterology;  Laterality: N/A;  ? JOINT REPLACEMENT Left 2004  ? Left hip  ? POLYPECTOMY  11/18/2020  ? Procedure: POLYPECTOMY;  Surgeon: Sharyn Creamer, MD;  [facility]: [facility];  Service: Gastroenterology;;  ? TOTAL HIP ARTHROPLASTY    ? ? ?FAMILY HISTORY: ?Family History  ?Problem Relation Age of Onset  ? Hypertension Mother   ? Heart disease Mother   ? Hyperlipidemia Mother   ? Heart failure Mother   ? Hypertension Sister   ? Hypertension Brother   ? Stroke Father   ? Hypertension Father   ? Colon cancer Maternal Grandmother   ? Diabetes  Maternal Grandmother   ? Esophageal cancer Neg Hx   ? ? ?SOCIAL HISTORY: ?Social History  ? ?Socioeconomic History  ? Marital status: Married  ?  Spouse name: [name]  ? Number of children: 1  ? Years of education: 38  ? Highest education level: Not on file  ?Occupational History  ? Occupation: disabled  ?Tobacco Use  ? Smoking status: Never  ? Smokeless tobacco: Never  ?Vaping Use  ? Vaping Use: Never used  ?Substance and Sexual Activity  ? Alcohol use: Not Currently  ? Drug use: No  ? Sexual activity: Not Currently  ?Other Topics Concern  ? Not on file  ?Social History Narrative  ? Patient drinks caffeine a couple times a week.  ? Patient is right handed.  ? Lives with wife [name]) and daughter  ? ?Social Determinants of Health  ? ?Financial Resource Strain: Not on file  ?Food Insecurity: Not on file  ?Transportation Needs: Not on file  ?Physical Activity: Not on file  ?Stress: Not on file  ?Social Connections: Not on file  ?Intimate Partner Violence: Not on file  ? ?PHYSICAL EXAM ? ?Vitals:  ? 04/17/21 1043  ?BP: 129/84  ?Pulse: 80  ?Height: '5\' 2"'$  (1.575 m)  ? ?Body mass index is 45.56 kg/m?. ? ?Generalized: Well developed, in no acute distress  ?Neurological examination   ?Mentation: Alert oriented to time, place, history taking. Follows all commands, speech is mildly dysarthric, not aphasic. Very pleasant.  ?Cranial nerve II-XII: Pupils were equal round reactive to light. Extraocular move

## 2021-04-23 ENCOUNTER — Ambulatory Visit: Payer: Self-pay

## 2021-04-23 ENCOUNTER — Ambulatory Visit: Payer: BC Managed Care – PPO | Attending: Internal Medicine | Admitting: Internal Medicine

## 2021-04-23 DIAGNOSIS — M25551 Pain in right hip: Secondary | ICD-10-CM

## 2021-04-23 NOTE — Progress Notes (Signed)
Virtual Visit via Telephone Note ? ?I connected with [name] on 04/23/2021 at 3:06 p.m by telephone and verified that I am speaking with the correct person using two identifiers ? ?Location: ?Patient: home ?Provider: office ? ?Participants: ?Myself ?Patient ?CMA: Ms. Sallyanne Havers ?[place] interpreter: ?  ?I discussed the limitations, risks, security and privacy concerns of performing an evaluation and management service by telephone and the availability of in person appointments. I also discussed with the patient that there may be a patient responsible charge related to this service. The patient expressed understanding and agreed to proceed. ? ? ?History of Present Illness: ?Pt with hx of HTN, DM, a.fib, rectal CA, esophageal ulcers on EGD done 11/2020, chronic Ca+ brain mass, sz, brain stem CVA 11/2016 with residual LT spastic hemiparesis and dysarthria, sacral ulcer, CKD 2.  This is an UC visit for RT hip pain ? ?Pt reports he had a fall 2 wks ago.  EMS came but he declined going to ER. ?2 days later he started having pain in RT hip but fell on LT leg.  Rates pain as 8/10.  Reports he has been weaned off MSO4 but still on Oxycodone.  Last took one 6 days ago.   ?We had spoken to his wife about this nn 04/17/2021. She indicated that patient was not having the pain anymore in either leg and she felt the x-ray was not necessary.  She reported that he was seen by neurology that day and had no complaints of pain with transfer. ? ?Outpatient Encounter Medications as of 04/23/2021  ?Medication Sig Note  ? amiodarone (PACERONE) 200 MG tablet TAKE 1 TABLET BY MOUTH EVERY DAY   ? atorvastatin (LIPITOR) 80 MG tablet Take 1 tablet (80 mg total) by mouth daily.   ? carvedilol (COREG) 12.5 MG tablet Take 1 tablet (12.5 mg total) by mouth 2 (two) times daily with a meal.   ? citalopram (CELEXA) 20 MG tablet TAKE 1 TABLET BY MOUTH EVERY DAY   ? collagenase (SANTYL) ointment Apply topically 2 (two) times daily. To yellow slough  and cover with damp to dry dressing.  Change dressing twice a day   ? diltiazem (CARDIZEM) 30 MG tablet TAKE 1 TABLET (30 MG TOTAL) BY MOUTH EVERY 6 HOURS   ? doxycycline (VIBRA-TABS) 100 MG tablet Take 1 tablet (100 mg total) by mouth 2 (two) times daily.   ? ferrous sulfate 325 (65 FE) MG tablet 1 tab PO Q Mon/Wed/Fri   ? meclizine (ANTIVERT) 12.5 MG tablet TAKE 1-2 TABLETS (12.5-25 MG TOTAL) BY MOUTH 3 (THREE) TIMES DAILY AS NEEDED FOR DIZZINESS.   ? morphine (MS CONTIN) 15 MG 12 hr tablet Take 1 tablet (15 mg total) by mouth every 12 (twelve) hours. 01/02/2021: Did not bring to appt, per pmp fill date 12/19/20 #60 reminded to bring to each appt  ? oxybutynin (DITROPAN) 5 MG tablet TAKE 1 TABLET TWICE A DAY   ? oxyCODONE (OXY IR/ROXICODONE) 5 MG immediate release tablet Take 1-2 tablets (5-10 mg total) by mouth every 6 (six) hours as needed for severe pain. 01/02/2021: Did not bring to appt, per pmp fill date 12/18/20 #160, reminded to bring to each appt  ? pantoprazole (PROTONIX) 40 MG tablet Take 1 tablet (40 mg total) by mouth 2 (two) times daily.   ? traZODone (DESYREL) 150 MG tablet Take 1/2 tablet (75 mg total) by mouth at bedtime.   ? ?No facility-administered encounter medications on file as of 04/23/2021.  ? ? ?  ?  Observations/Objective: ?No direct observation done as this was a telephone visit. ? ?Assessment and Plan: ?1. Hip pain, acute, right ?Advised patient to use one of his oxycodone if needed for the pain. ?We will go ahead and order an x-ray of the right hip through [facility] imaging at the [facility].  Patient advised to have his wife bring him to the radiology department to have the x-ray done.  He expressed understanding.  He has an upcoming visit with me later this week for routine follow-up which he intends to keep. ? ? ?Follow Up Instructions: ?Keep routine follow-up visit scheduled for later this week. ?  ?I discussed the assessment and treatment plan with the patient. The  patient was provided an opportunity to ask questions and all were answered. The patient agreed with the plan and demonstrated an understanding of the instructions. ?  ?The patient was advised to call back or seek an in-person evaluation if the symptoms worsen or if the condition fails to improve as anticipated. ? ?I  Spent 9 minutes on this telephone encounter ? ?This note has been created with Surveyor, quantity. Any transcriptional errors are unintentional. ? ?Karle Plumber, MD ? ?

## 2021-04-23 NOTE — Telephone Encounter (Signed)
?  Chief Complaint: leg pain ?Symptoms: RLE pain 8/10, cramping sensation ?Frequency: 1 week or more ?Pertinent Negatives: NA ?Disposition: '[]'$ ED /'[]'$ Urgent Care (no appt availability in office) / '[x]'$ Appointment(In office/virtual)/ '[]'$  [place] [facility]/ '[]'$ Home Care/ '[]'$ Refused Recommended Disposition /'[]'$ [place] [facility]/ '[]'$  Follow-up with PCP ?Additional Notes: scheduled pt for VV at 1510 with PCP and pt has OV on 04/26/21 to possibly f/up if needed. Pt hasn't tried anything for the pain. I advised him to try vinegar or mustard for cramps and make sure to drink plenty of water. Pt also has oxycodone that he could try for pain that I recommended.  ? ?Reason for Disposition ? [1] SEVERE pain (e.g., excruciating, unable to do any normal activities) AND [2] not improved after 2 hours of pain medicine ? ?Answer Assessment - Initial Assessment Questions ?1. ONSET: "When did the pain start?"  ?    1 week  ?2. LOCATION: "Where is the pain located?"  ?    RLE ?3. PAIN: "How bad is the pain?"    (Scale 1-10; or mild, moderate, severe) ?  -  MILD (1-3): doesn't interfere with normal activities  ?  -  MODERATE (4-7): interferes with normal activities (e.g., work or school) or awakens from sleep, limping  ?  -  SEVERE (8-10): excruciating pain, unable to do any normal activities, unable to walk ?    8 ?6. OTHER SYMPTOMS: "Do you have any other symptoms?" (e.g., chest pain, back pain, breathing difficulty, swelling, rash, fever, numbness, weakness) ?    Weakness to RLE and unable to bear weight on leg. ? ?Protocols used: Leg Pain-A-AH ? ?

## 2021-04-26 ENCOUNTER — Ambulatory Visit: Payer: BC Managed Care – PPO | Admitting: Internal Medicine

## 2021-04-28 DIAGNOSIS — Z993 Dependence on wheelchair: Secondary | ICD-10-CM | POA: Diagnosis not present

## 2021-04-28 DIAGNOSIS — Z9181 History of falling: Secondary | ICD-10-CM | POA: Diagnosis not present

## 2021-04-28 DIAGNOSIS — I69398 Other sequelae of cerebral infarction: Secondary | ICD-10-CM | POA: Diagnosis not present

## 2021-04-28 DIAGNOSIS — I69354 Hemiplegia and hemiparesis following cerebral infarction affecting left non-dominant side: Secondary | ICD-10-CM | POA: Diagnosis not present

## 2021-05-03 ENCOUNTER — Telehealth: Payer: Self-pay | Admitting: Internal Medicine

## 2021-05-03 DIAGNOSIS — I4891 Unspecified atrial fibrillation: Secondary | ICD-10-CM | POA: Diagnosis not present

## 2021-05-03 DIAGNOSIS — I700 Atherosclerosis of aorta: Secondary | ICD-10-CM | POA: Diagnosis not present

## 2021-05-03 DIAGNOSIS — G40909 Epilepsy, unspecified, not intractable, without status epilepticus: Secondary | ICD-10-CM | POA: Diagnosis not present

## 2021-05-03 DIAGNOSIS — Z9089 Acquired absence of other organs: Secondary | ICD-10-CM | POA: Diagnosis not present

## 2021-05-03 DIAGNOSIS — E785 Hyperlipidemia, unspecified: Secondary | ICD-10-CM | POA: Diagnosis not present

## 2021-05-03 DIAGNOSIS — Z79891 Long term (current) use of opiate analgesic: Secondary | ICD-10-CM | POA: Diagnosis not present

## 2021-05-03 DIAGNOSIS — D509 Iron deficiency anemia, unspecified: Secondary | ICD-10-CM | POA: Diagnosis not present

## 2021-05-03 DIAGNOSIS — I69328 Other speech and language deficits following cerebral infarction: Secondary | ICD-10-CM | POA: Diagnosis not present

## 2021-05-03 DIAGNOSIS — I69354 Hemiplegia and hemiparesis following cerebral infarction affecting left non-dominant side: Secondary | ICD-10-CM | POA: Diagnosis not present

## 2021-05-03 DIAGNOSIS — Z96642 Presence of left artificial hip joint: Secondary | ICD-10-CM | POA: Diagnosis not present

## 2021-05-03 DIAGNOSIS — E669 Obesity, unspecified: Secondary | ICD-10-CM | POA: Diagnosis not present

## 2021-05-03 DIAGNOSIS — E119 Type 2 diabetes mellitus without complications: Secondary | ICD-10-CM | POA: Diagnosis not present

## 2021-05-03 DIAGNOSIS — Z9049 Acquired absence of other specified parts of digestive tract: Secondary | ICD-10-CM | POA: Diagnosis not present

## 2021-05-03 DIAGNOSIS — I69322 Dysarthria following cerebral infarction: Secondary | ICD-10-CM | POA: Diagnosis not present

## 2021-05-03 DIAGNOSIS — I10 Essential (primary) hypertension: Secondary | ICD-10-CM | POA: Diagnosis not present

## 2021-05-03 DIAGNOSIS — M109 Gout, unspecified: Secondary | ICD-10-CM | POA: Diagnosis not present

## 2021-05-03 NOTE — Telephone Encounter (Signed)
Pts wife called stating that she is intrested in her husband entering in a nursing home. She stated that she has a family emergency and wanted to know if its possible for her husband to be placed in a nursing home for 7 days.  ? ?Pts wife is requesting a phone call back from you. ?

## 2021-05-05 ENCOUNTER — Other Ambulatory Visit: Payer: Self-pay | Admitting: Internal Medicine

## 2021-05-05 DIAGNOSIS — I480 Paroxysmal atrial fibrillation: Secondary | ICD-10-CM

## 2021-05-05 DIAGNOSIS — R42 Dizziness and giddiness: Secondary | ICD-10-CM

## 2021-05-06 NOTE — Telephone Encounter (Signed)
Call returned to patient's wife, [name] # [phone], message left with call back requested to this CM ?

## 2021-05-07 ENCOUNTER — Telehealth: Payer: Self-pay | Admitting: Internal Medicine

## 2021-05-07 DIAGNOSIS — I10 Essential (primary) hypertension: Secondary | ICD-10-CM | POA: Diagnosis not present

## 2021-05-07 DIAGNOSIS — I700 Atherosclerosis of aorta: Secondary | ICD-10-CM | POA: Diagnosis not present

## 2021-05-07 DIAGNOSIS — I69322 Dysarthria following cerebral infarction: Secondary | ICD-10-CM | POA: Diagnosis not present

## 2021-05-07 DIAGNOSIS — Z96642 Presence of left artificial hip joint: Secondary | ICD-10-CM | POA: Diagnosis not present

## 2021-05-07 DIAGNOSIS — I69354 Hemiplegia and hemiparesis following cerebral infarction affecting left non-dominant side: Secondary | ICD-10-CM | POA: Diagnosis not present

## 2021-05-07 DIAGNOSIS — E119 Type 2 diabetes mellitus without complications: Secondary | ICD-10-CM | POA: Diagnosis not present

## 2021-05-07 DIAGNOSIS — Z9089 Acquired absence of other organs: Secondary | ICD-10-CM | POA: Diagnosis not present

## 2021-05-07 DIAGNOSIS — G40909 Epilepsy, unspecified, not intractable, without status epilepticus: Secondary | ICD-10-CM | POA: Diagnosis not present

## 2021-05-07 DIAGNOSIS — D509 Iron deficiency anemia, unspecified: Secondary | ICD-10-CM | POA: Diagnosis not present

## 2021-05-07 DIAGNOSIS — I4891 Unspecified atrial fibrillation: Secondary | ICD-10-CM | POA: Diagnosis not present

## 2021-05-07 DIAGNOSIS — Z9049 Acquired absence of other specified parts of digestive tract: Secondary | ICD-10-CM | POA: Diagnosis not present

## 2021-05-07 DIAGNOSIS — Z79891 Long term (current) use of opiate analgesic: Secondary | ICD-10-CM | POA: Diagnosis not present

## 2021-05-07 DIAGNOSIS — M109 Gout, unspecified: Secondary | ICD-10-CM | POA: Diagnosis not present

## 2021-05-07 DIAGNOSIS — E669 Obesity, unspecified: Secondary | ICD-10-CM | POA: Diagnosis not present

## 2021-05-07 DIAGNOSIS — I69328 Other speech and language deficits following cerebral infarction: Secondary | ICD-10-CM | POA: Diagnosis not present

## 2021-05-07 DIAGNOSIS — E785 Hyperlipidemia, unspecified: Secondary | ICD-10-CM | POA: Diagnosis not present

## 2021-05-07 NOTE — Telephone Encounter (Signed)
Requested medications are due for refill today.  yes ? ?Requested medications are on the active medications list.  yes ? ?Last refill. Meclizine refilled 02/26/2021 #60 1 refill, Amiodarone refilled 02/26/2021 #30 2 refills ? ?Future visit scheduled.   yes ? ?Notes to clinic.  Neither medication refill is delegated. ? ? ? ?Requested Prescriptions  ?Pending Prescriptions Disp Refills  ? meclizine (ANTIVERT) 12.5 MG tablet [Pharmacy Med Name: MECLIZINE 12.5 MG TABLET] 60 tablet 1  ?  Sig: TAKE 1-2 TABLETS (12.5-25 MG TOTAL) BY MOUTH 3 (THREE) TIMES DAILY AS NEEDED FOR DIZZINESS.  ?  ? Not Delegated - Gastroenterology: Antiemetics Failed - 05/05/2021  1:00 PM  ?  ?  Failed - This refill cannot be delegated  ?  ?  Passed - Valid encounter within last 6 months  ?  Recent Outpatient Visits   ? ?      ? 2 weeks ago Hip pain, acute, right  ? [place] Ladell Pier, MD  ? 3 months ago Vertigo  ? [place], [place] W, NP  ? 3 months ago Dizziness  ? [place] Ladell Pier, MD  ? 4 months ago Hospital discharge follow-up  ? [place] Karle Plumber B, MD  ? 6 months ago Pain of right upper extremity  ? [place] Ladell Pier, MD  ? ?  ?  ?Future Appointments   ? ?        ? In 1 week Ladell Pier, MD [place]  ? ?  ? ? ?  ?  ?  ? amiodarone (PACERONE) 200 MG tablet [Pharmacy Med Name: AMIODARONE HCL 200 MG TABLET] 90 tablet   ?  Sig: TAKE 1 TABLET BY MOUTH EVERY DAY  ?  ? Not Delegated - Cardiovascular: Antiarrhythmic Agents - amiodarone Failed - 05/05/2021  1:00 PM  ?  ?  Failed - This refill cannot be delegated  ?  ?  Failed - Manual Review: Eye exam recommended every 12 months  ?  ?  Failed - Patient had chest x-ray within the last 6 months  ?  ?  Passed - TSH in normal range and within 360 days  ?  TSH   ?Date Value Ref Range Status  ?11/14/2020 1.076 0.350 - 4.500 uIU/mL Final  ?  Comment:  ?  Performed by a 3rd Generation assay with a functional sensitivity of <=0.01 uIU/mL. ?Performed at [facility], [place] [address], [place], [place] [address] ?  ?  ?  ?  ?  Passed - Mg Level in normal range and within 360 days  ?  Magnesium  ?Date Value Ref Range Status  ?11/17/2020 2.4 1.7 - 2.4 mg/dL Final  ?  Comment:  ?  Performed at [facility], [place] [address], [place], [place] [address]  ?  ?  ?  ?  Passed - K in normal range and within 180 days  ?  Potassium  ?Date Value Ref Range Status  ?04/16/2021 4.4 3.5 - 5.3 mmol/L Final  ?  ?  ?  ?  Passed - AST in normal range and within 180 days  ?  AST  ?Date Value Ref Range Status  ?02/19/2021 19 10 - 35 U/L Final  ?  ?  ?  ?  Passed - ALT in normal  range and within 180 days  ?  ALT  ?Date Value Ref Range Status  ?02/19/2021 25 9 - 46 U/L Final  ?  ?  ?  ?  Passed - Patient had ECG in the last 180 days  ?  ?  Passed - Patient is not pregnant  ?  ?  Passed - Last BP in normal range  ?  BP Readings from Last 1 Encounters:  ?04/17/21 129/84  ?  ?  ?  ?  Passed - Last Heart Rate in normal range  ?  Pulse Readings from Last 1 Encounters:  ?04/17/21 80  ?  ?  ?  ?  Passed - Valid encounter within last 6 months  ?  Recent Outpatient Visits   ? ?      ? 2 weeks ago Hip pain, acute, right  ? [place] Ladell Pier, MD  ? 3 months ago Vertigo  ? [place], [place] W, NP  ? 3 months ago Dizziness  ? [place] Ladell Pier, MD  ? 4 months ago Hospital discharge follow-up  ? [place] Karle Plumber B, MD  ? 6 months ago Pain of right upper extremity  ? [place] Ladell Pier, MD  ? ?  ?  ?Future Appointments   ? ?        ? In 1 week Ladell Pier, MD [place]  ? ?  ? ? ?  ?  ?  ?  ?

## 2021-05-07 NOTE — Telephone Encounter (Signed)
Copied from [place]. Topic: Quick Communication - Home Health Verbal Orders >> May 07, 2021 11:03 AM Loma Boston wrote: Caller/Agency: Lenna Sciara with [facility] Number: [phone] Requesting OT Therapy:  Frequency: 1 wk 1    2 wk 2    1 wk 1

## 2021-05-07 NOTE — Telephone Encounter (Signed)
Call placed to patient's wife, [name].  She explained that she needs to attend to another family member's medical issues next month and she will be out of state. She is inquiring about placement for her husband in a nursing facility -  ALF or SNF for 4-5 days while she is away. ?I instructed her to call his insurance company to inquire if this is even something they would cover and if so, are there recommended facilities. If facility placement is a covered benefit I explained to her that she can also contact [employer] for information about facilities. I text her the web address as she requested: [email or web address] and instructed her to call me back if she has additional questions.   ?

## 2021-05-08 ENCOUNTER — Telehealth: Payer: Self-pay | Admitting: Internal Medicine

## 2021-05-08 NOTE — Telephone Encounter (Signed)
Pt was instructed to follow-up w/ Cardiology S/p hosp discharge in late 2022 but I can't see in the chart where he's done this. Therefore, will defer approval for amiodarone to PCP. ?

## 2021-05-08 NOTE — Telephone Encounter (Signed)
Phone call placed to patient this a.m.   ?Patient informed that I received request for refill on amiodarone.  I informed the patient that I had filled for him a few months ago to give him time to see the cardiologist Dr. Doylene Canard.  However moving forward, he would need to call the cardiologist to refill on the medication as this medication does require some monitoring by the cardiologist.  Patient expressed understanding and states he will call.  I inquired whether he had seen Dr. Doylene Canard since hospital discharge in December of last year.  Patient states he did.  I also called his wife and left a message on her voicemail informing her of the same. ?

## 2021-05-09 NOTE — Telephone Encounter (Signed)
Orders approved for:  ? ?Caller/Agency: Melissa with [facility] ?Callback Number: [phone] ?Requesting OT Therapy:  ?Frequency: 1 wk 1    2 wk 2    1 wk 1 ?

## 2021-05-13 DIAGNOSIS — E669 Obesity, unspecified: Secondary | ICD-10-CM | POA: Diagnosis not present

## 2021-05-13 DIAGNOSIS — I10 Essential (primary) hypertension: Secondary | ICD-10-CM | POA: Diagnosis not present

## 2021-05-13 DIAGNOSIS — E785 Hyperlipidemia, unspecified: Secondary | ICD-10-CM | POA: Diagnosis not present

## 2021-05-13 DIAGNOSIS — Z9089 Acquired absence of other organs: Secondary | ICD-10-CM | POA: Diagnosis not present

## 2021-05-13 DIAGNOSIS — E119 Type 2 diabetes mellitus without complications: Secondary | ICD-10-CM | POA: Diagnosis not present

## 2021-05-13 DIAGNOSIS — I700 Atherosclerosis of aorta: Secondary | ICD-10-CM | POA: Diagnosis not present

## 2021-05-13 DIAGNOSIS — Z79891 Long term (current) use of opiate analgesic: Secondary | ICD-10-CM | POA: Diagnosis not present

## 2021-05-13 DIAGNOSIS — M109 Gout, unspecified: Secondary | ICD-10-CM | POA: Diagnosis not present

## 2021-05-13 DIAGNOSIS — Z96642 Presence of left artificial hip joint: Secondary | ICD-10-CM | POA: Diagnosis not present

## 2021-05-13 DIAGNOSIS — I69354 Hemiplegia and hemiparesis following cerebral infarction affecting left non-dominant side: Secondary | ICD-10-CM | POA: Diagnosis not present

## 2021-05-13 DIAGNOSIS — I4891 Unspecified atrial fibrillation: Secondary | ICD-10-CM | POA: Diagnosis not present

## 2021-05-13 DIAGNOSIS — I69328 Other speech and language deficits following cerebral infarction: Secondary | ICD-10-CM | POA: Diagnosis not present

## 2021-05-13 DIAGNOSIS — Z9049 Acquired absence of other specified parts of digestive tract: Secondary | ICD-10-CM | POA: Diagnosis not present

## 2021-05-13 DIAGNOSIS — D509 Iron deficiency anemia, unspecified: Secondary | ICD-10-CM | POA: Diagnosis not present

## 2021-05-13 DIAGNOSIS — I69322 Dysarthria following cerebral infarction: Secondary | ICD-10-CM | POA: Diagnosis not present

## 2021-05-13 DIAGNOSIS — G40909 Epilepsy, unspecified, not intractable, without status epilepticus: Secondary | ICD-10-CM | POA: Diagnosis not present

## 2021-05-14 DIAGNOSIS — L89300 Pressure ulcer of unspecified buttock, unstageable: Secondary | ICD-10-CM | POA: Diagnosis not present

## 2021-05-15 DIAGNOSIS — I10 Essential (primary) hypertension: Secondary | ICD-10-CM | POA: Diagnosis not present

## 2021-05-15 DIAGNOSIS — D509 Iron deficiency anemia, unspecified: Secondary | ICD-10-CM | POA: Diagnosis not present

## 2021-05-15 DIAGNOSIS — Z9049 Acquired absence of other specified parts of digestive tract: Secondary | ICD-10-CM | POA: Diagnosis not present

## 2021-05-15 DIAGNOSIS — Z96642 Presence of left artificial hip joint: Secondary | ICD-10-CM | POA: Diagnosis not present

## 2021-05-15 DIAGNOSIS — E119 Type 2 diabetes mellitus without complications: Secondary | ICD-10-CM | POA: Diagnosis not present

## 2021-05-15 DIAGNOSIS — I69322 Dysarthria following cerebral infarction: Secondary | ICD-10-CM | POA: Diagnosis not present

## 2021-05-15 DIAGNOSIS — M109 Gout, unspecified: Secondary | ICD-10-CM | POA: Diagnosis not present

## 2021-05-15 DIAGNOSIS — I4891 Unspecified atrial fibrillation: Secondary | ICD-10-CM | POA: Diagnosis not present

## 2021-05-15 DIAGNOSIS — I69354 Hemiplegia and hemiparesis following cerebral infarction affecting left non-dominant side: Secondary | ICD-10-CM | POA: Diagnosis not present

## 2021-05-15 DIAGNOSIS — E669 Obesity, unspecified: Secondary | ICD-10-CM | POA: Diagnosis not present

## 2021-05-15 DIAGNOSIS — G40909 Epilepsy, unspecified, not intractable, without status epilepticus: Secondary | ICD-10-CM | POA: Diagnosis not present

## 2021-05-15 DIAGNOSIS — Z9089 Acquired absence of other organs: Secondary | ICD-10-CM | POA: Diagnosis not present

## 2021-05-15 DIAGNOSIS — E785 Hyperlipidemia, unspecified: Secondary | ICD-10-CM | POA: Diagnosis not present

## 2021-05-15 DIAGNOSIS — I69328 Other speech and language deficits following cerebral infarction: Secondary | ICD-10-CM | POA: Diagnosis not present

## 2021-05-15 DIAGNOSIS — I700 Atherosclerosis of aorta: Secondary | ICD-10-CM | POA: Diagnosis not present

## 2021-05-15 DIAGNOSIS — Z79891 Long term (current) use of opiate analgesic: Secondary | ICD-10-CM | POA: Diagnosis not present

## 2021-05-16 ENCOUNTER — Telehealth (HOSPITAL_BASED_OUTPATIENT_CLINIC_OR_DEPARTMENT_OTHER): Payer: BC Managed Care – PPO | Admitting: Internal Medicine

## 2021-05-16 ENCOUNTER — Telehealth: Payer: Self-pay

## 2021-05-16 DIAGNOSIS — Z538 Procedure and treatment not carried out for other reasons: Secondary | ICD-10-CM

## 2021-05-16 NOTE — Telephone Encounter (Signed)
Call placed to [facility], OT/ [facility] # [phone] to inquire who placed the order for the power wheelchair because the patient has a virtual  appointment today with Dr Wynetta Emery for a power wheelchair evaluation. She explained that their PT sent an order to [facility] a few months ago and the patient's wife declined the chair but now they are requesting the order be re-instated. Melissa recommended that I contact Dave/ [facility] [employer] # [phone] for more specific information about the order.  ? ? ?I spoke to [place] and he confirmed what Melissa just said, patient's wife had declined the initial order for the chair but they have changed their mind.  The home health PT has done an evaluation for the power wheelchair. They need a face to face encounter with the patient and provider.  This can be virtual but needs to include audio and video. The provider's note must state that this encounter is a face to face encounter for a mobility assessment and a power wheelchair is recommended. The patient's height , weight and list of medications must also be included.  ?This information must be sent to [facility].  I will need to obtain the fax number.   ?[facility] will then send the provider a complete packet of information to review/sign.  ?

## 2021-05-16 NOTE — Progress Notes (Signed)
Patient appt is him talking about needing a power wheelchair. ?

## 2021-05-17 ENCOUNTER — Telehealth (HOSPITAL_BASED_OUTPATIENT_CLINIC_OR_DEPARTMENT_OTHER): Payer: BC Managed Care – PPO | Admitting: Internal Medicine

## 2021-05-17 DIAGNOSIS — I69354 Hemiplegia and hemiparesis following cerebral infarction affecting left non-dominant side: Secondary | ICD-10-CM | POA: Diagnosis not present

## 2021-05-17 DIAGNOSIS — I69322 Dysarthria following cerebral infarction: Secondary | ICD-10-CM | POA: Diagnosis not present

## 2021-05-17 DIAGNOSIS — E119 Type 2 diabetes mellitus without complications: Secondary | ICD-10-CM | POA: Diagnosis not present

## 2021-05-17 DIAGNOSIS — M109 Gout, unspecified: Secondary | ICD-10-CM | POA: Diagnosis not present

## 2021-05-17 DIAGNOSIS — G40909 Epilepsy, unspecified, not intractable, without status epilepticus: Secondary | ICD-10-CM | POA: Diagnosis not present

## 2021-05-17 DIAGNOSIS — I69328 Other speech and language deficits following cerebral infarction: Secondary | ICD-10-CM | POA: Diagnosis not present

## 2021-05-17 DIAGNOSIS — I700 Atherosclerosis of aorta: Secondary | ICD-10-CM | POA: Diagnosis not present

## 2021-05-17 DIAGNOSIS — D509 Iron deficiency anemia, unspecified: Secondary | ICD-10-CM | POA: Diagnosis not present

## 2021-05-17 DIAGNOSIS — E669 Obesity, unspecified: Secondary | ICD-10-CM | POA: Diagnosis not present

## 2021-05-17 DIAGNOSIS — Z96642 Presence of left artificial hip joint: Secondary | ICD-10-CM | POA: Diagnosis not present

## 2021-05-17 DIAGNOSIS — Z538 Procedure and treatment not carried out for other reasons: Secondary | ICD-10-CM

## 2021-05-17 DIAGNOSIS — I10 Essential (primary) hypertension: Secondary | ICD-10-CM | POA: Diagnosis not present

## 2021-05-17 DIAGNOSIS — Z79891 Long term (current) use of opiate analgesic: Secondary | ICD-10-CM | POA: Diagnosis not present

## 2021-05-17 DIAGNOSIS — Z9049 Acquired absence of other specified parts of digestive tract: Secondary | ICD-10-CM | POA: Diagnosis not present

## 2021-05-17 DIAGNOSIS — I4891 Unspecified atrial fibrillation: Secondary | ICD-10-CM | POA: Diagnosis not present

## 2021-05-17 DIAGNOSIS — E785 Hyperlipidemia, unspecified: Secondary | ICD-10-CM | POA: Diagnosis not present

## 2021-05-17 DIAGNOSIS — Z9089 Acquired absence of other organs: Secondary | ICD-10-CM | POA: Diagnosis not present

## 2021-05-17 NOTE — Progress Notes (Signed)
This was supposed to be a video visit at 8:10 AM this morning for evaluation for power wheelchair.  I spent over 20 minutes intermittently calling the patient and tried to help him get on the video visit.  We were eventually able to connect but patient was not showing up on camera.  I told him that we will need to reschedule for an in office visit.  Message will be sent to our caseworker to have him scheduled for an in person visit. ?

## 2021-05-20 DIAGNOSIS — E119 Type 2 diabetes mellitus without complications: Secondary | ICD-10-CM | POA: Diagnosis not present

## 2021-05-20 DIAGNOSIS — Z9049 Acquired absence of other specified parts of digestive tract: Secondary | ICD-10-CM | POA: Diagnosis not present

## 2021-05-20 DIAGNOSIS — I69328 Other speech and language deficits following cerebral infarction: Secondary | ICD-10-CM | POA: Diagnosis not present

## 2021-05-20 DIAGNOSIS — M109 Gout, unspecified: Secondary | ICD-10-CM | POA: Diagnosis not present

## 2021-05-20 DIAGNOSIS — E669 Obesity, unspecified: Secondary | ICD-10-CM | POA: Diagnosis not present

## 2021-05-20 DIAGNOSIS — E785 Hyperlipidemia, unspecified: Secondary | ICD-10-CM | POA: Diagnosis not present

## 2021-05-20 DIAGNOSIS — Z9089 Acquired absence of other organs: Secondary | ICD-10-CM | POA: Diagnosis not present

## 2021-05-20 DIAGNOSIS — Z96642 Presence of left artificial hip joint: Secondary | ICD-10-CM | POA: Diagnosis not present

## 2021-05-20 DIAGNOSIS — I69322 Dysarthria following cerebral infarction: Secondary | ICD-10-CM | POA: Diagnosis not present

## 2021-05-20 DIAGNOSIS — I69354 Hemiplegia and hemiparesis following cerebral infarction affecting left non-dominant side: Secondary | ICD-10-CM | POA: Diagnosis not present

## 2021-05-20 DIAGNOSIS — D509 Iron deficiency anemia, unspecified: Secondary | ICD-10-CM | POA: Diagnosis not present

## 2021-05-20 DIAGNOSIS — I10 Essential (primary) hypertension: Secondary | ICD-10-CM | POA: Diagnosis not present

## 2021-05-20 DIAGNOSIS — G40909 Epilepsy, unspecified, not intractable, without status epilepticus: Secondary | ICD-10-CM | POA: Diagnosis not present

## 2021-05-20 DIAGNOSIS — Z79891 Long term (current) use of opiate analgesic: Secondary | ICD-10-CM | POA: Diagnosis not present

## 2021-05-20 DIAGNOSIS — I700 Atherosclerosis of aorta: Secondary | ICD-10-CM | POA: Diagnosis not present

## 2021-05-20 DIAGNOSIS — I4891 Unspecified atrial fibrillation: Secondary | ICD-10-CM | POA: Diagnosis not present

## 2021-05-20 NOTE — Telephone Encounter (Signed)
Message left for [name]/ [employer] # [phone] requesting a copy of home health PT eval for Dr Wynetta Emery.  ?Call back requested to this CM ?

## 2021-05-20 NOTE — Telephone Encounter (Signed)
Message received from [place] stating that he will fax the PT eval and other forms needing PCP signature to [facility] ?

## 2021-05-24 DIAGNOSIS — M109 Gout, unspecified: Secondary | ICD-10-CM | POA: Diagnosis not present

## 2021-05-24 DIAGNOSIS — Z79891 Long term (current) use of opiate analgesic: Secondary | ICD-10-CM | POA: Diagnosis not present

## 2021-05-24 DIAGNOSIS — I700 Atherosclerosis of aorta: Secondary | ICD-10-CM | POA: Diagnosis not present

## 2021-05-24 DIAGNOSIS — Z9049 Acquired absence of other specified parts of digestive tract: Secondary | ICD-10-CM | POA: Diagnosis not present

## 2021-05-24 DIAGNOSIS — I10 Essential (primary) hypertension: Secondary | ICD-10-CM | POA: Diagnosis not present

## 2021-05-24 DIAGNOSIS — I69328 Other speech and language deficits following cerebral infarction: Secondary | ICD-10-CM | POA: Diagnosis not present

## 2021-05-24 DIAGNOSIS — I69354 Hemiplegia and hemiparesis following cerebral infarction affecting left non-dominant side: Secondary | ICD-10-CM | POA: Diagnosis not present

## 2021-05-24 DIAGNOSIS — Z96642 Presence of left artificial hip joint: Secondary | ICD-10-CM | POA: Diagnosis not present

## 2021-05-24 DIAGNOSIS — I4891 Unspecified atrial fibrillation: Secondary | ICD-10-CM | POA: Diagnosis not present

## 2021-05-24 DIAGNOSIS — Z9089 Acquired absence of other organs: Secondary | ICD-10-CM | POA: Diagnosis not present

## 2021-05-24 DIAGNOSIS — E669 Obesity, unspecified: Secondary | ICD-10-CM | POA: Diagnosis not present

## 2021-05-24 DIAGNOSIS — G40909 Epilepsy, unspecified, not intractable, without status epilepticus: Secondary | ICD-10-CM | POA: Diagnosis not present

## 2021-05-24 DIAGNOSIS — E119 Type 2 diabetes mellitus without complications: Secondary | ICD-10-CM | POA: Diagnosis not present

## 2021-05-24 DIAGNOSIS — E785 Hyperlipidemia, unspecified: Secondary | ICD-10-CM | POA: Diagnosis not present

## 2021-05-24 DIAGNOSIS — I69322 Dysarthria following cerebral infarction: Secondary | ICD-10-CM | POA: Diagnosis not present

## 2021-05-24 DIAGNOSIS — D509 Iron deficiency anemia, unspecified: Secondary | ICD-10-CM | POA: Diagnosis not present

## 2021-05-28 ENCOUNTER — Ambulatory Visit: Payer: BC Managed Care – PPO | Attending: Internal Medicine | Admitting: Internal Medicine

## 2021-05-28 ENCOUNTER — Encounter: Payer: Self-pay | Admitting: Internal Medicine

## 2021-05-28 VITALS — BP 128/84 | HR 77 | Temp 98.7°F | Resp 16 | Wt 179.0 lb

## 2021-05-28 DIAGNOSIS — Z993 Dependence on wheelchair: Secondary | ICD-10-CM | POA: Diagnosis not present

## 2021-05-28 DIAGNOSIS — I69354 Hemiplegia and hemiparesis following cerebral infarction affecting left non-dominant side: Secondary | ICD-10-CM

## 2021-05-28 DIAGNOSIS — R296 Repeated falls: Secondary | ICD-10-CM | POA: Diagnosis not present

## 2021-05-28 DIAGNOSIS — I69398 Other sequelae of cerebral infarction: Secondary | ICD-10-CM | POA: Diagnosis not present

## 2021-05-28 DIAGNOSIS — Z9181 History of falling: Secondary | ICD-10-CM | POA: Diagnosis not present

## 2021-05-28 DIAGNOSIS — R5381 Other malaise: Secondary | ICD-10-CM | POA: Diagnosis not present

## 2021-05-28 NOTE — Progress Notes (Signed)
? ? ?Patient ID: [name], male    DOB: [birth year]  MRN: [id number] ? ?CC: mobility evaluation ? ? ?Subjective: ?[name] is a 56 y.o. male who presents for mobility evaluation to get a power wheelchair.  Wife is with him. 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 ? ? ?Patient presents today for mobility evaluation to get a power wheelchair. ?Pt with hx of CVA in 2018 that has left him with spastic hemiparesis on the LT side.  Patient currently has a manual wheelchair and uses the right hand and legs to self propel.  However, only able to do so for short distances due to easy fatigue, deconditioning and weakness in the left leg.  Manual wheelchair is unable to get into the bathrooms so he has to use a hemiwalker to get into the bathrooms.  He has had and continues to have home physical therapy.  Despite this, he has had 3 falls since the beginning of this year all of which have occurred when he tries to transfer from his manual wheelchair to some other surface most commonly his bed.  Most recent fall occurred in the early part of last month where EMS came out to his house but he declined being seen in the emergency room.  He had a telephone visit with me 2 weeks after that complaining of pain in the right hip.  Also had a fall 05/2020 where he sustained a closed fracture of his left fourth toe when trying to transfer from the toilet to his hemiwalker.  Patient feels having a mobility chair would allow him to maneuver in his home environment more safely and transfer easily.  He also states that when out in public, he would not have to have another person to push him as he is having to do now in the manual wheelchair. ? ?His wife is not in favor of him getting the mobility chair.  She feels it  would stagnate his progress and motivation to get out of the chair.  She feels he would do better if he would put into practice things that are taught to him by the home physical therapist. ? ? ?Patient Active Problem List  ? Diagnosis Date Noted  ? Sacral osteomyelitis ([place]) 02/19/2021  ? Osteomyelitis of coccyx ([place]) 02/19/2021  ? Chronic pain syndrome 01/02/2021  ? Spasticity as late effect of cerebrovascular accident (CVA) 01/02/2021  ? Esophageal ulcer without bleeding 12/25/2020  ? Sacral decubitus ulcer, stage IV ([place]) 12/25/2020  ? Adenomatous polyp of colon 12/25/2020  ? CKD (chronic kidney disease) stage 2, GFR 60-89 ml/min 12/25/2020  ? Mild major depression ([place]) 12/25/2020  ? Sacral decubitus ulcer 12/17/2020  ? Hyponatremia 12/17/2020  ? Acute gastric ulcer   ? Controlled type 2 diabetes mellitus with hyperglycemia, without long-term current use of insulin ([place])   ? New onset atrial fibrillation ([place])   ? Pressure injury of skin 11/26/2020  ? Debility 11/23/2020  ? History of colonic polyps   ? Nausea and vomiting   ? AKI (acute kidney injury) ([place]) 11/14/2020  ? Atrial fibrillation with RVR ([place]) 11/14/2020  ? History of CVA with residual deficit 11/14/2020  ? Gout 11/14/2020  ? Microcytic hypochromic anemia 11/14/2020  ? History of meningioma 11/14/2020  ? Obesity (BMI 30.0-34.9) 11/14/2020  ?  Pain due to onychomycosis of toenails of both feet 09/21/2020  ? Edema of both legs 09/29/2019  ? Meningioma ([place]) 04/13/2019  ? Hemiparesis and speech and language deficit as late effects of stroke ([place]) 03/31/2018  ? Gait abnormality 03/31/2018  ? Spastic hemiplegia affecting left nondominant side ([place]) 12/18/2017  ? Subacromial impingement of left shoulder 06/09/2017  ? Spastic neurogenic bladder 06/09/2017  ? Dysarthria as late effect of stroke 04/01/2017  ? Neurogenic bladder due to old stroke 12/25/2016  ? Right pontine cerebrovascular accident [facility]) 12/18/2016  ? Left hemiparesis ([place])   ? Diastolic  dysfunction   ? Dysphagia, post-stroke   ? Acute ischemic stroke ([place]) 12/12/2016  ? Seizures ([place]) 07/22/2014  ? Hypertension 07/22/2014  ? Type 2 diabetes mellitus with vascular disease ([place]) 07/22/2014  ? Brain mass   ? Malignant neoplasm of rectum ([place]) 09/02/2007  ?  ? ?Current Outpatient Medications on File Prior to Visit  ?Medication Sig Dispense Refill  ? amiodarone (PACERONE) 200 MG tablet TAKE 1 TABLET BY MOUTH EVERY DAY 30 tablet 2  ? atorvastatin (LIPITOR) 80 MG tablet Take 1 tablet (80 mg total) by mouth daily. 90 tablet 1  ? carvedilol (COREG) 12.5 MG tablet Take 1 tablet (12.5 mg total) by mouth 2 (two) times daily with a meal. 60 tablet 6  ? citalopram (CELEXA) 20 MG tablet TAKE 1 TABLET BY MOUTH EVERY DAY 90 tablet 1  ? collagenase (SANTYL) ointment Apply topically 2 (two) times daily. To yellow slough and cover with damp to dry dressing.  Change dressing twice a day 30 g 6  ? diltiazem (CARDIZEM) 30 MG tablet TAKE 1 TABLET (30 MG TOTAL) BY MOUTH EVERY 6 HOURS 360 tablet 1  ? doxycycline (VIBRA-TABS) 100 MG tablet Take 1 tablet (100 mg total) by mouth 2 (two) times daily. 84 tablet 0  ? ferrous sulfate 325 (65 FE) MG tablet 1 tab PO Q Mon/Wed/Fri 100 tablet 1  ? meclizine (ANTIVERT) 12.5 MG tablet TAKE 1-2 TABLETS (12.5-25 MG TOTAL) BY MOUTH 3 (THREE) TIMES DAILY AS NEEDED FOR DIZZINESS. 60 tablet 1  ? morphine (MS CONTIN) 15 MG 12 hr tablet Take 1 tablet (15 mg total) by mouth every 12 (twelve) hours. 60 tablet 0  ? oxybutynin (DITROPAN) 5 MG tablet TAKE 1 TABLET TWICE A DAY 60 tablet 5  ? oxyCODONE (OXY IR/ROXICODONE) 5 MG immediate release tablet Take 1-2 tablets (5-10 mg total) by mouth every 6 (six) hours as needed for severe pain. 160 tablet 0  ? pantoprazole (PROTONIX) 40 MG tablet Take 1 tablet (40 mg total) by mouth 2 (two) times daily. 60 tablet 11  ? traZODone (DESYREL) 150 MG tablet Take 1/2 tablet (75 mg total) by mouth at bedtime. 30 tablet 0  ? ?No current facility-administered  medications on file prior to visit.  ? ? ?No Known Allergies ? ?Social History  ? ?Socioeconomic History  ? Marital status: Married  ?  Spouse name: [name]  ? Number of children: 1  ? Years of education: 13  ? Highest education level: Not on file  ?Occupational History  ? Occupation: disabled  ?Tobacco Use  ? Smoking status: Never  ? Smokeless tobacco: Never  ?Vaping Use  ? Vaping Use: Never used  ?Substance and Sexual Activity  ? Alcohol use: Not Currently  ? Drug use: No  ? Sexual activity: Not Currently  ?Other Topics Concern  ? Not on file  ?Social History Narrative  ? Patient drinks caffeine a couple  times a week.  ? Patient is right handed.  ? Lives with wife [name]) and daughter  ? ?Social Determinants of Health  ? ?Financial Resource Strain: Not on file  ?Food Insecurity: Not on file  ?Transportation Needs: Not on file  ?Physical Activity: Not on file  ?Stress: Not on file  ?Social Connections: Not on file  ?Intimate Partner Violence: Not on file  ? ? ?Family History  ?Problem Relation Age of Onset  ? Hypertension Mother   ? Heart disease Mother   ? Hyperlipidemia Mother   ? Heart failure Mother   ? Hypertension Sister   ? Hypertension Brother   ? Stroke Father   ? Hypertension Father   ? Colon cancer Maternal Grandmother   ? Diabetes Maternal Grandmother   ? Esophageal cancer Neg Hx   ? ? ?Past Surgical History:  ?Procedure Laterality Date  ? APPENDECTOMY    ? BIOPSY  11/18/2020  ? Procedure: BIOPSY;  Surgeon: Sharyn Creamer, MD;  Location: [facility] ENDOSCOPY;  Service: Gastroenterology;;  ? COLONOSCOPY N/A 11/18/2020  ? Procedure: COLONOSCOPY;  Surgeon: Sharyn Creamer, MD;  Location: [place];  Service: Gastroenterology;  Laterality: N/A;  ? ESOPHAGOGASTRODUODENOSCOPY (EGD) WITH PROPOFOL N/A 11/18/2020  ? Procedure: ESOPHAGOGASTRODUODENOSCOPY (EGD) WITH PROPOFOL;  Surgeon: Sharyn Creamer, MD;  Location: [place];  Service: Gastroenterology;  Laterality: N/A;  ? JOINT REPLACEMENT Left 2004  ? Left hip   ? POLYPECTOMY  11/18/2020  ? Procedure: POLYPECTOMY;  Surgeon: Sharyn Creamer, MD;  Location: [place];  Service: Gastroenterology;;  ? TOTAL HIP ARTHROPLASTY    ? ? ?ROS: ?Review of Systems ?Negative except a

## 2021-05-29 DIAGNOSIS — I69322 Dysarthria following cerebral infarction: Secondary | ICD-10-CM | POA: Diagnosis not present

## 2021-05-29 DIAGNOSIS — D509 Iron deficiency anemia, unspecified: Secondary | ICD-10-CM | POA: Diagnosis not present

## 2021-05-29 DIAGNOSIS — I4891 Unspecified atrial fibrillation: Secondary | ICD-10-CM | POA: Diagnosis not present

## 2021-05-29 DIAGNOSIS — Z9089 Acquired absence of other organs: Secondary | ICD-10-CM | POA: Diagnosis not present

## 2021-05-29 DIAGNOSIS — G40909 Epilepsy, unspecified, not intractable, without status epilepticus: Secondary | ICD-10-CM | POA: Diagnosis not present

## 2021-05-29 DIAGNOSIS — I69328 Other speech and language deficits following cerebral infarction: Secondary | ICD-10-CM | POA: Diagnosis not present

## 2021-05-29 DIAGNOSIS — E669 Obesity, unspecified: Secondary | ICD-10-CM | POA: Diagnosis not present

## 2021-05-29 DIAGNOSIS — Z79891 Long term (current) use of opiate analgesic: Secondary | ICD-10-CM | POA: Diagnosis not present

## 2021-05-29 DIAGNOSIS — M109 Gout, unspecified: Secondary | ICD-10-CM | POA: Diagnosis not present

## 2021-05-29 DIAGNOSIS — I10 Essential (primary) hypertension: Secondary | ICD-10-CM | POA: Diagnosis not present

## 2021-05-29 DIAGNOSIS — Z9049 Acquired absence of other specified parts of digestive tract: Secondary | ICD-10-CM | POA: Diagnosis not present

## 2021-05-29 DIAGNOSIS — Z96642 Presence of left artificial hip joint: Secondary | ICD-10-CM | POA: Diagnosis not present

## 2021-05-29 DIAGNOSIS — E785 Hyperlipidemia, unspecified: Secondary | ICD-10-CM | POA: Diagnosis not present

## 2021-05-29 DIAGNOSIS — I69354 Hemiplegia and hemiparesis following cerebral infarction affecting left non-dominant side: Secondary | ICD-10-CM | POA: Diagnosis not present

## 2021-05-29 DIAGNOSIS — I700 Atherosclerosis of aorta: Secondary | ICD-10-CM | POA: Diagnosis not present

## 2021-05-29 DIAGNOSIS — E119 Type 2 diabetes mellitus without complications: Secondary | ICD-10-CM | POA: Diagnosis not present

## 2021-05-30 ENCOUNTER — Telehealth: Payer: Self-pay | Admitting: Internal Medicine

## 2021-05-30 DIAGNOSIS — I69354 Hemiplegia and hemiparesis following cerebral infarction affecting left non-dominant side: Secondary | ICD-10-CM | POA: Diagnosis not present

## 2021-05-30 DIAGNOSIS — I69322 Dysarthria following cerebral infarction: Secondary | ICD-10-CM | POA: Diagnosis not present

## 2021-05-30 DIAGNOSIS — Z79891 Long term (current) use of opiate analgesic: Secondary | ICD-10-CM | POA: Diagnosis not present

## 2021-05-30 DIAGNOSIS — I10 Essential (primary) hypertension: Secondary | ICD-10-CM | POA: Diagnosis not present

## 2021-05-30 DIAGNOSIS — Z9049 Acquired absence of other specified parts of digestive tract: Secondary | ICD-10-CM | POA: Diagnosis not present

## 2021-05-30 DIAGNOSIS — E669 Obesity, unspecified: Secondary | ICD-10-CM | POA: Diagnosis not present

## 2021-05-30 DIAGNOSIS — Z9089 Acquired absence of other organs: Secondary | ICD-10-CM | POA: Diagnosis not present

## 2021-05-30 DIAGNOSIS — I4891 Unspecified atrial fibrillation: Secondary | ICD-10-CM | POA: Diagnosis not present

## 2021-05-30 DIAGNOSIS — M109 Gout, unspecified: Secondary | ICD-10-CM | POA: Diagnosis not present

## 2021-05-30 DIAGNOSIS — E119 Type 2 diabetes mellitus without complications: Secondary | ICD-10-CM | POA: Diagnosis not present

## 2021-05-30 DIAGNOSIS — E785 Hyperlipidemia, unspecified: Secondary | ICD-10-CM | POA: Diagnosis not present

## 2021-05-30 DIAGNOSIS — I69328 Other speech and language deficits following cerebral infarction: Secondary | ICD-10-CM | POA: Diagnosis not present

## 2021-05-30 DIAGNOSIS — D509 Iron deficiency anemia, unspecified: Secondary | ICD-10-CM | POA: Diagnosis not present

## 2021-05-30 DIAGNOSIS — Z96642 Presence of left artificial hip joint: Secondary | ICD-10-CM | POA: Diagnosis not present

## 2021-05-30 DIAGNOSIS — I700 Atherosclerosis of aorta: Secondary | ICD-10-CM | POA: Diagnosis not present

## 2021-05-30 DIAGNOSIS — G40909 Epilepsy, unspecified, not intractable, without status epilepticus: Secondary | ICD-10-CM | POA: Diagnosis not present

## 2021-05-30 NOTE — Telephone Encounter (Signed)
Copied from [place] [phone]. Topic: Quick Communication - Home Health Verbal Orders >> May 29, 2021 10:08 AM Tessa Lerner A wrote: Caller/Agency: [facility]  Callback Number: [phone] Requesting OT/PT/Skilled Nursing/Social Work/Speech Therapy: OT Frequency: 1w3 with reassesment

## 2021-06-03 NOTE — Telephone Encounter (Signed)
Called and left vm with verbal orders

## 2021-06-05 ENCOUNTER — Encounter: Payer: Self-pay | Admitting: Internal Medicine

## 2021-06-05 DIAGNOSIS — Z96642 Presence of left artificial hip joint: Secondary | ICD-10-CM | POA: Diagnosis not present

## 2021-06-05 DIAGNOSIS — I69322 Dysarthria following cerebral infarction: Secondary | ICD-10-CM | POA: Diagnosis not present

## 2021-06-05 DIAGNOSIS — Z9089 Acquired absence of other organs: Secondary | ICD-10-CM | POA: Diagnosis not present

## 2021-06-05 DIAGNOSIS — I700 Atherosclerosis of aorta: Secondary | ICD-10-CM | POA: Diagnosis not present

## 2021-06-05 DIAGNOSIS — Z79891 Long term (current) use of opiate analgesic: Secondary | ICD-10-CM | POA: Diagnosis not present

## 2021-06-05 DIAGNOSIS — M109 Gout, unspecified: Secondary | ICD-10-CM | POA: Diagnosis not present

## 2021-06-05 DIAGNOSIS — E669 Obesity, unspecified: Secondary | ICD-10-CM | POA: Diagnosis not present

## 2021-06-05 DIAGNOSIS — E785 Hyperlipidemia, unspecified: Secondary | ICD-10-CM | POA: Diagnosis not present

## 2021-06-05 DIAGNOSIS — I10 Essential (primary) hypertension: Secondary | ICD-10-CM | POA: Diagnosis not present

## 2021-06-05 DIAGNOSIS — Z9049 Acquired absence of other specified parts of digestive tract: Secondary | ICD-10-CM | POA: Diagnosis not present

## 2021-06-05 DIAGNOSIS — I69328 Other speech and language deficits following cerebral infarction: Secondary | ICD-10-CM | POA: Diagnosis not present

## 2021-06-05 DIAGNOSIS — I69354 Hemiplegia and hemiparesis following cerebral infarction affecting left non-dominant side: Secondary | ICD-10-CM | POA: Diagnosis not present

## 2021-06-05 DIAGNOSIS — I4891 Unspecified atrial fibrillation: Secondary | ICD-10-CM | POA: Diagnosis not present

## 2021-06-05 DIAGNOSIS — D509 Iron deficiency anemia, unspecified: Secondary | ICD-10-CM | POA: Diagnosis not present

## 2021-06-05 DIAGNOSIS — E119 Type 2 diabetes mellitus without complications: Secondary | ICD-10-CM | POA: Diagnosis not present

## 2021-06-05 DIAGNOSIS — G40909 Epilepsy, unspecified, not intractable, without status epilepticus: Secondary | ICD-10-CM | POA: Diagnosis not present

## 2021-06-07 ENCOUNTER — Telehealth: Payer: Self-pay | Admitting: Internal Medicine

## 2021-06-07 DIAGNOSIS — I69328 Other speech and language deficits following cerebral infarction: Secondary | ICD-10-CM | POA: Diagnosis not present

## 2021-06-07 DIAGNOSIS — Z79891 Long term (current) use of opiate analgesic: Secondary | ICD-10-CM | POA: Diagnosis not present

## 2021-06-07 DIAGNOSIS — I69354 Hemiplegia and hemiparesis following cerebral infarction affecting left non-dominant side: Secondary | ICD-10-CM | POA: Diagnosis not present

## 2021-06-07 DIAGNOSIS — D509 Iron deficiency anemia, unspecified: Secondary | ICD-10-CM | POA: Diagnosis not present

## 2021-06-07 DIAGNOSIS — M109 Gout, unspecified: Secondary | ICD-10-CM | POA: Diagnosis not present

## 2021-06-07 DIAGNOSIS — I10 Essential (primary) hypertension: Secondary | ICD-10-CM | POA: Diagnosis not present

## 2021-06-07 DIAGNOSIS — E669 Obesity, unspecified: Secondary | ICD-10-CM | POA: Diagnosis not present

## 2021-06-07 DIAGNOSIS — Z96642 Presence of left artificial hip joint: Secondary | ICD-10-CM | POA: Diagnosis not present

## 2021-06-07 DIAGNOSIS — E119 Type 2 diabetes mellitus without complications: Secondary | ICD-10-CM | POA: Diagnosis not present

## 2021-06-07 DIAGNOSIS — Z9089 Acquired absence of other organs: Secondary | ICD-10-CM | POA: Diagnosis not present

## 2021-06-07 DIAGNOSIS — I69322 Dysarthria following cerebral infarction: Secondary | ICD-10-CM | POA: Diagnosis not present

## 2021-06-07 DIAGNOSIS — Z9049 Acquired absence of other specified parts of digestive tract: Secondary | ICD-10-CM | POA: Diagnosis not present

## 2021-06-07 DIAGNOSIS — E785 Hyperlipidemia, unspecified: Secondary | ICD-10-CM | POA: Diagnosis not present

## 2021-06-07 DIAGNOSIS — I4891 Unspecified atrial fibrillation: Secondary | ICD-10-CM | POA: Diagnosis not present

## 2021-06-07 DIAGNOSIS — G40909 Epilepsy, unspecified, not intractable, without status epilepticus: Secondary | ICD-10-CM | POA: Diagnosis not present

## 2021-06-07 DIAGNOSIS — I700 Atherosclerosis of aorta: Secondary | ICD-10-CM | POA: Diagnosis not present

## 2021-06-07 NOTE — Telephone Encounter (Signed)
Home Health Verbal Orders - Caller/Agency: [facility] Number: [phone]  Requesting OT/PT/Skilled Nursing/Social Work/Speech Therapy: extension for PT  Frequency: 1w9

## 2021-06-07 NOTE — Telephone Encounter (Signed)
Verbal orders has been given for patient.

## 2021-06-12 DIAGNOSIS — G40909 Epilepsy, unspecified, not intractable, without status epilepticus: Secondary | ICD-10-CM | POA: Diagnosis not present

## 2021-06-12 DIAGNOSIS — Z79891 Long term (current) use of opiate analgesic: Secondary | ICD-10-CM | POA: Diagnosis not present

## 2021-06-12 DIAGNOSIS — I69322 Dysarthria following cerebral infarction: Secondary | ICD-10-CM | POA: Diagnosis not present

## 2021-06-12 DIAGNOSIS — E119 Type 2 diabetes mellitus without complications: Secondary | ICD-10-CM | POA: Diagnosis not present

## 2021-06-12 DIAGNOSIS — I10 Essential (primary) hypertension: Secondary | ICD-10-CM | POA: Diagnosis not present

## 2021-06-12 DIAGNOSIS — Z9049 Acquired absence of other specified parts of digestive tract: Secondary | ICD-10-CM | POA: Diagnosis not present

## 2021-06-12 DIAGNOSIS — Z9089 Acquired absence of other organs: Secondary | ICD-10-CM | POA: Diagnosis not present

## 2021-06-12 DIAGNOSIS — D509 Iron deficiency anemia, unspecified: Secondary | ICD-10-CM | POA: Diagnosis not present

## 2021-06-12 DIAGNOSIS — Z96642 Presence of left artificial hip joint: Secondary | ICD-10-CM | POA: Diagnosis not present

## 2021-06-12 DIAGNOSIS — E669 Obesity, unspecified: Secondary | ICD-10-CM | POA: Diagnosis not present

## 2021-06-12 DIAGNOSIS — E785 Hyperlipidemia, unspecified: Secondary | ICD-10-CM | POA: Diagnosis not present

## 2021-06-12 DIAGNOSIS — I69354 Hemiplegia and hemiparesis following cerebral infarction affecting left non-dominant side: Secondary | ICD-10-CM | POA: Diagnosis not present

## 2021-06-12 DIAGNOSIS — M109 Gout, unspecified: Secondary | ICD-10-CM | POA: Diagnosis not present

## 2021-06-12 DIAGNOSIS — I69328 Other speech and language deficits following cerebral infarction: Secondary | ICD-10-CM | POA: Diagnosis not present

## 2021-06-12 DIAGNOSIS — I4891 Unspecified atrial fibrillation: Secondary | ICD-10-CM | POA: Diagnosis not present

## 2021-06-12 DIAGNOSIS — I700 Atherosclerosis of aorta: Secondary | ICD-10-CM | POA: Diagnosis not present

## 2021-06-14 DIAGNOSIS — L89300 Pressure ulcer of unspecified buttock, unstageable: Secondary | ICD-10-CM | POA: Diagnosis not present

## 2021-06-18 DIAGNOSIS — I700 Atherosclerosis of aorta: Secondary | ICD-10-CM | POA: Diagnosis not present

## 2021-06-18 DIAGNOSIS — Z87440 Personal history of urinary (tract) infections: Secondary | ICD-10-CM | POA: Diagnosis not present

## 2021-06-18 DIAGNOSIS — Z96642 Presence of left artificial hip joint: Secondary | ICD-10-CM | POA: Diagnosis not present

## 2021-06-18 DIAGNOSIS — M109 Gout, unspecified: Secondary | ICD-10-CM | POA: Diagnosis not present

## 2021-06-18 DIAGNOSIS — Z6834 Body mass index (BMI) 34.0-34.9, adult: Secondary | ICD-10-CM | POA: Diagnosis not present

## 2021-06-18 DIAGNOSIS — I69322 Dysarthria following cerebral infarction: Secondary | ICD-10-CM | POA: Diagnosis not present

## 2021-06-18 DIAGNOSIS — D509 Iron deficiency anemia, unspecified: Secondary | ICD-10-CM | POA: Diagnosis not present

## 2021-06-18 DIAGNOSIS — Z9049 Acquired absence of other specified parts of digestive tract: Secondary | ICD-10-CM | POA: Diagnosis not present

## 2021-06-18 DIAGNOSIS — G40909 Epilepsy, unspecified, not intractable, without status epilepticus: Secondary | ICD-10-CM | POA: Diagnosis not present

## 2021-06-18 DIAGNOSIS — E669 Obesity, unspecified: Secondary | ICD-10-CM | POA: Diagnosis not present

## 2021-06-18 DIAGNOSIS — E785 Hyperlipidemia, unspecified: Secondary | ICD-10-CM | POA: Diagnosis not present

## 2021-06-18 DIAGNOSIS — E119 Type 2 diabetes mellitus without complications: Secondary | ICD-10-CM | POA: Diagnosis not present

## 2021-06-18 DIAGNOSIS — I69328 Other speech and language deficits following cerebral infarction: Secondary | ICD-10-CM | POA: Diagnosis not present

## 2021-06-18 DIAGNOSIS — I4891 Unspecified atrial fibrillation: Secondary | ICD-10-CM | POA: Diagnosis not present

## 2021-06-18 DIAGNOSIS — I69354 Hemiplegia and hemiparesis following cerebral infarction affecting left non-dominant side: Secondary | ICD-10-CM | POA: Diagnosis not present

## 2021-06-18 DIAGNOSIS — I10 Essential (primary) hypertension: Secondary | ICD-10-CM | POA: Diagnosis not present

## 2021-06-19 DIAGNOSIS — L89300 Pressure ulcer of unspecified buttock, unstageable: Secondary | ICD-10-CM | POA: Diagnosis not present

## 2021-06-21 DIAGNOSIS — I4891 Unspecified atrial fibrillation: Secondary | ICD-10-CM | POA: Diagnosis not present

## 2021-06-21 DIAGNOSIS — I10 Essential (primary) hypertension: Secondary | ICD-10-CM | POA: Diagnosis not present

## 2021-06-21 DIAGNOSIS — I700 Atherosclerosis of aorta: Secondary | ICD-10-CM | POA: Diagnosis not present

## 2021-06-21 DIAGNOSIS — I69322 Dysarthria following cerebral infarction: Secondary | ICD-10-CM | POA: Diagnosis not present

## 2021-06-21 DIAGNOSIS — Z96642 Presence of left artificial hip joint: Secondary | ICD-10-CM | POA: Diagnosis not present

## 2021-06-21 DIAGNOSIS — Z6834 Body mass index (BMI) 34.0-34.9, adult: Secondary | ICD-10-CM | POA: Diagnosis not present

## 2021-06-21 DIAGNOSIS — E119 Type 2 diabetes mellitus without complications: Secondary | ICD-10-CM | POA: Diagnosis not present

## 2021-06-21 DIAGNOSIS — I69328 Other speech and language deficits following cerebral infarction: Secondary | ICD-10-CM | POA: Diagnosis not present

## 2021-06-21 DIAGNOSIS — G40909 Epilepsy, unspecified, not intractable, without status epilepticus: Secondary | ICD-10-CM | POA: Diagnosis not present

## 2021-06-21 DIAGNOSIS — Z87440 Personal history of urinary (tract) infections: Secondary | ICD-10-CM | POA: Diagnosis not present

## 2021-06-21 DIAGNOSIS — D509 Iron deficiency anemia, unspecified: Secondary | ICD-10-CM | POA: Diagnosis not present

## 2021-06-21 DIAGNOSIS — L89894 Pressure ulcer of other site, stage 4: Secondary | ICD-10-CM | POA: Diagnosis not present

## 2021-06-21 DIAGNOSIS — E785 Hyperlipidemia, unspecified: Secondary | ICD-10-CM | POA: Diagnosis not present

## 2021-06-21 DIAGNOSIS — M109 Gout, unspecified: Secondary | ICD-10-CM | POA: Diagnosis not present

## 2021-06-21 DIAGNOSIS — I69354 Hemiplegia and hemiparesis following cerebral infarction affecting left non-dominant side: Secondary | ICD-10-CM | POA: Diagnosis not present

## 2021-06-21 DIAGNOSIS — Z9049 Acquired absence of other specified parts of digestive tract: Secondary | ICD-10-CM | POA: Diagnosis not present

## 2021-06-21 DIAGNOSIS — E669 Obesity, unspecified: Secondary | ICD-10-CM | POA: Diagnosis not present

## 2021-06-21 DIAGNOSIS — R5381 Other malaise: Secondary | ICD-10-CM | POA: Diagnosis not present

## 2021-06-24 ENCOUNTER — Telehealth: Payer: Self-pay | Admitting: Internal Medicine

## 2021-06-24 NOTE — Telephone Encounter (Signed)
Call returned to patient's wife, [name] # [phone] , message left with call back requested

## 2021-06-24 NOTE — Telephone Encounter (Signed)
Copied from [place] [phone]. Topic: General - Other >> Jun 24, 2021 11:30 AM [name] wrote: Reason for CRM: pt's wife, [name] called in, sates pt needs temporary housing until she returns, unless pt can get 24hr home health. She has to go out of state for an emergency with another family member. Please call back.

## 2021-06-25 DIAGNOSIS — E669 Obesity, unspecified: Secondary | ICD-10-CM | POA: Diagnosis not present

## 2021-06-25 DIAGNOSIS — Z9049 Acquired absence of other specified parts of digestive tract: Secondary | ICD-10-CM | POA: Diagnosis not present

## 2021-06-25 DIAGNOSIS — M109 Gout, unspecified: Secondary | ICD-10-CM | POA: Diagnosis not present

## 2021-06-25 DIAGNOSIS — I10 Essential (primary) hypertension: Secondary | ICD-10-CM | POA: Diagnosis not present

## 2021-06-25 DIAGNOSIS — D509 Iron deficiency anemia, unspecified: Secondary | ICD-10-CM | POA: Diagnosis not present

## 2021-06-25 DIAGNOSIS — I4891 Unspecified atrial fibrillation: Secondary | ICD-10-CM | POA: Diagnosis not present

## 2021-06-25 DIAGNOSIS — Z87440 Personal history of urinary (tract) infections: Secondary | ICD-10-CM | POA: Diagnosis not present

## 2021-06-25 DIAGNOSIS — I69354 Hemiplegia and hemiparesis following cerebral infarction affecting left non-dominant side: Secondary | ICD-10-CM | POA: Diagnosis not present

## 2021-06-25 DIAGNOSIS — Z6834 Body mass index (BMI) 34.0-34.9, adult: Secondary | ICD-10-CM | POA: Diagnosis not present

## 2021-06-25 DIAGNOSIS — Z96642 Presence of left artificial hip joint: Secondary | ICD-10-CM | POA: Diagnosis not present

## 2021-06-25 DIAGNOSIS — G40909 Epilepsy, unspecified, not intractable, without status epilepticus: Secondary | ICD-10-CM | POA: Diagnosis not present

## 2021-06-25 DIAGNOSIS — I69328 Other speech and language deficits following cerebral infarction: Secondary | ICD-10-CM | POA: Diagnosis not present

## 2021-06-25 DIAGNOSIS — I69322 Dysarthria following cerebral infarction: Secondary | ICD-10-CM | POA: Diagnosis not present

## 2021-06-25 DIAGNOSIS — E785 Hyperlipidemia, unspecified: Secondary | ICD-10-CM | POA: Diagnosis not present

## 2021-06-25 DIAGNOSIS — I700 Atherosclerosis of aorta: Secondary | ICD-10-CM | POA: Diagnosis not present

## 2021-06-25 DIAGNOSIS — E119 Type 2 diabetes mellitus without complications: Secondary | ICD-10-CM | POA: Diagnosis not present

## 2021-06-25 NOTE — Telephone Encounter (Signed)
I called  patient's wife, [name] # [phone] again and left a message requesting a call back.  I am not aware of an agency that would provide 24 hour home health services covered by insurance or temporary housing.  The options would most likely be private pay unless authorized by insurance.

## 2021-06-27 DIAGNOSIS — E669 Obesity, unspecified: Secondary | ICD-10-CM | POA: Diagnosis not present

## 2021-06-27 DIAGNOSIS — Z9049 Acquired absence of other specified parts of digestive tract: Secondary | ICD-10-CM | POA: Diagnosis not present

## 2021-06-27 DIAGNOSIS — Z87440 Personal history of urinary (tract) infections: Secondary | ICD-10-CM | POA: Diagnosis not present

## 2021-06-27 DIAGNOSIS — Z96642 Presence of left artificial hip joint: Secondary | ICD-10-CM | POA: Diagnosis not present

## 2021-06-27 DIAGNOSIS — I4891 Unspecified atrial fibrillation: Secondary | ICD-10-CM | POA: Diagnosis not present

## 2021-06-27 DIAGNOSIS — E785 Hyperlipidemia, unspecified: Secondary | ICD-10-CM | POA: Diagnosis not present

## 2021-06-27 DIAGNOSIS — G40909 Epilepsy, unspecified, not intractable, without status epilepticus: Secondary | ICD-10-CM | POA: Diagnosis not present

## 2021-06-27 DIAGNOSIS — D509 Iron deficiency anemia, unspecified: Secondary | ICD-10-CM | POA: Diagnosis not present

## 2021-06-27 DIAGNOSIS — I69354 Hemiplegia and hemiparesis following cerebral infarction affecting left non-dominant side: Secondary | ICD-10-CM | POA: Diagnosis not present

## 2021-06-27 DIAGNOSIS — I700 Atherosclerosis of aorta: Secondary | ICD-10-CM | POA: Diagnosis not present

## 2021-06-27 DIAGNOSIS — I10 Essential (primary) hypertension: Secondary | ICD-10-CM | POA: Diagnosis not present

## 2021-06-27 DIAGNOSIS — I69322 Dysarthria following cerebral infarction: Secondary | ICD-10-CM | POA: Diagnosis not present

## 2021-06-27 DIAGNOSIS — M109 Gout, unspecified: Secondary | ICD-10-CM | POA: Diagnosis not present

## 2021-06-27 DIAGNOSIS — I69328 Other speech and language deficits following cerebral infarction: Secondary | ICD-10-CM | POA: Diagnosis not present

## 2021-06-27 DIAGNOSIS — Z6834 Body mass index (BMI) 34.0-34.9, adult: Secondary | ICD-10-CM | POA: Diagnosis not present

## 2021-06-27 DIAGNOSIS — E119 Type 2 diabetes mellitus without complications: Secondary | ICD-10-CM | POA: Diagnosis not present

## 2021-06-28 DIAGNOSIS — Z9181 History of falling: Secondary | ICD-10-CM | POA: Diagnosis not present

## 2021-06-28 DIAGNOSIS — I69398 Other sequelae of cerebral infarction: Secondary | ICD-10-CM | POA: Diagnosis not present

## 2021-06-28 DIAGNOSIS — Z993 Dependence on wheelchair: Secondary | ICD-10-CM | POA: Diagnosis not present

## 2021-06-28 DIAGNOSIS — I69354 Hemiplegia and hemiparesis following cerebral infarction affecting left non-dominant side: Secondary | ICD-10-CM | POA: Diagnosis not present

## 2021-07-02 ENCOUNTER — Telehealth: Payer: Self-pay | Admitting: Internal Medicine

## 2021-07-02 DIAGNOSIS — Z9049 Acquired absence of other specified parts of digestive tract: Secondary | ICD-10-CM | POA: Diagnosis not present

## 2021-07-02 DIAGNOSIS — Z6834 Body mass index (BMI) 34.0-34.9, adult: Secondary | ICD-10-CM | POA: Diagnosis not present

## 2021-07-02 DIAGNOSIS — Z96642 Presence of left artificial hip joint: Secondary | ICD-10-CM | POA: Diagnosis not present

## 2021-07-02 DIAGNOSIS — I4891 Unspecified atrial fibrillation: Secondary | ICD-10-CM | POA: Diagnosis not present

## 2021-07-02 DIAGNOSIS — I69322 Dysarthria following cerebral infarction: Secondary | ICD-10-CM | POA: Diagnosis not present

## 2021-07-02 DIAGNOSIS — Z87440 Personal history of urinary (tract) infections: Secondary | ICD-10-CM | POA: Diagnosis not present

## 2021-07-02 DIAGNOSIS — E669 Obesity, unspecified: Secondary | ICD-10-CM | POA: Diagnosis not present

## 2021-07-02 DIAGNOSIS — M109 Gout, unspecified: Secondary | ICD-10-CM | POA: Diagnosis not present

## 2021-07-02 DIAGNOSIS — E785 Hyperlipidemia, unspecified: Secondary | ICD-10-CM | POA: Diagnosis not present

## 2021-07-02 DIAGNOSIS — G40909 Epilepsy, unspecified, not intractable, without status epilepticus: Secondary | ICD-10-CM | POA: Diagnosis not present

## 2021-07-02 DIAGNOSIS — I69354 Hemiplegia and hemiparesis following cerebral infarction affecting left non-dominant side: Secondary | ICD-10-CM | POA: Diagnosis not present

## 2021-07-02 DIAGNOSIS — I700 Atherosclerosis of aorta: Secondary | ICD-10-CM | POA: Diagnosis not present

## 2021-07-02 DIAGNOSIS — I69328 Other speech and language deficits following cerebral infarction: Secondary | ICD-10-CM | POA: Diagnosis not present

## 2021-07-02 DIAGNOSIS — E119 Type 2 diabetes mellitus without complications: Secondary | ICD-10-CM | POA: Diagnosis not present

## 2021-07-02 DIAGNOSIS — I10 Essential (primary) hypertension: Secondary | ICD-10-CM | POA: Diagnosis not present

## 2021-07-02 DIAGNOSIS — D509 Iron deficiency anemia, unspecified: Secondary | ICD-10-CM | POA: Diagnosis not present

## 2021-07-02 NOTE — Telephone Encounter (Signed)
Copied from [place] [phone]. Topic: Quick Communication - Home Health Verbal Orders >> Jul 02, 2021 10:30 AM Marcellus Scott wrote: Caller/Agency: [place] Number: [phone] Requesting OT/PT/Skilled Nursing/Social Work/Speech Therapy: OT Frequency: 1w2,2w2,1w3

## 2021-07-02 NOTE — Telephone Encounter (Signed)
Left voicemail for requested verbal orders.

## 2021-07-04 DIAGNOSIS — I69322 Dysarthria following cerebral infarction: Secondary | ICD-10-CM | POA: Diagnosis not present

## 2021-07-04 DIAGNOSIS — Z96642 Presence of left artificial hip joint: Secondary | ICD-10-CM | POA: Diagnosis not present

## 2021-07-04 DIAGNOSIS — D509 Iron deficiency anemia, unspecified: Secondary | ICD-10-CM | POA: Diagnosis not present

## 2021-07-04 DIAGNOSIS — I69328 Other speech and language deficits following cerebral infarction: Secondary | ICD-10-CM | POA: Diagnosis not present

## 2021-07-04 DIAGNOSIS — I4891 Unspecified atrial fibrillation: Secondary | ICD-10-CM | POA: Diagnosis not present

## 2021-07-04 DIAGNOSIS — Z6834 Body mass index (BMI) 34.0-34.9, adult: Secondary | ICD-10-CM | POA: Diagnosis not present

## 2021-07-04 DIAGNOSIS — I700 Atherosclerosis of aorta: Secondary | ICD-10-CM | POA: Diagnosis not present

## 2021-07-04 DIAGNOSIS — E669 Obesity, unspecified: Secondary | ICD-10-CM | POA: Diagnosis not present

## 2021-07-04 DIAGNOSIS — I10 Essential (primary) hypertension: Secondary | ICD-10-CM | POA: Diagnosis not present

## 2021-07-04 DIAGNOSIS — Z9049 Acquired absence of other specified parts of digestive tract: Secondary | ICD-10-CM | POA: Diagnosis not present

## 2021-07-04 DIAGNOSIS — E119 Type 2 diabetes mellitus without complications: Secondary | ICD-10-CM | POA: Diagnosis not present

## 2021-07-04 DIAGNOSIS — G40909 Epilepsy, unspecified, not intractable, without status epilepticus: Secondary | ICD-10-CM | POA: Diagnosis not present

## 2021-07-04 DIAGNOSIS — Z87440 Personal history of urinary (tract) infections: Secondary | ICD-10-CM | POA: Diagnosis not present

## 2021-07-04 DIAGNOSIS — I69354 Hemiplegia and hemiparesis following cerebral infarction affecting left non-dominant side: Secondary | ICD-10-CM | POA: Diagnosis not present

## 2021-07-04 DIAGNOSIS — M109 Gout, unspecified: Secondary | ICD-10-CM | POA: Diagnosis not present

## 2021-07-04 DIAGNOSIS — E785 Hyperlipidemia, unspecified: Secondary | ICD-10-CM | POA: Diagnosis not present

## 2021-07-05 ENCOUNTER — Telehealth: Payer: Self-pay | Admitting: Internal Medicine

## 2021-07-05 NOTE — Telephone Encounter (Signed)
Copied from CRM [id number]. Topic: Appointment Scheduling - Scheduling Inquiry for Clinic >> Jul 05, 2021 12:38 PM Esperanza Sheets wrote: Reason for CRM: patient's spouse Spouse states she received a vm stating patient needed his a1c checked  Spouse requesting an appt to have a1c checked, provider's next avalibility is 9-29  Please assist caller further

## 2021-07-05 NOTE — Telephone Encounter (Signed)
Per patient's wife she received a call placed by our office prompting her to answer the phone.  She stated when she answered the phone, it was an automated system telling her that patient needed lab test and gave the lab test name: A1C.  Did not see where anyone called patient and do not see previous orders for A1C.     Please advise.

## 2021-07-10 DIAGNOSIS — Z6834 Body mass index (BMI) 34.0-34.9, adult: Secondary | ICD-10-CM | POA: Diagnosis not present

## 2021-07-10 DIAGNOSIS — E785 Hyperlipidemia, unspecified: Secondary | ICD-10-CM | POA: Diagnosis not present

## 2021-07-10 DIAGNOSIS — Z9049 Acquired absence of other specified parts of digestive tract: Secondary | ICD-10-CM | POA: Diagnosis not present

## 2021-07-10 DIAGNOSIS — I4891 Unspecified atrial fibrillation: Secondary | ICD-10-CM | POA: Diagnosis not present

## 2021-07-10 DIAGNOSIS — I700 Atherosclerosis of aorta: Secondary | ICD-10-CM | POA: Diagnosis not present

## 2021-07-10 DIAGNOSIS — E669 Obesity, unspecified: Secondary | ICD-10-CM | POA: Diagnosis not present

## 2021-07-10 DIAGNOSIS — G40909 Epilepsy, unspecified, not intractable, without status epilepticus: Secondary | ICD-10-CM | POA: Diagnosis not present

## 2021-07-10 DIAGNOSIS — M109 Gout, unspecified: Secondary | ICD-10-CM | POA: Diagnosis not present

## 2021-07-10 DIAGNOSIS — Z96642 Presence of left artificial hip joint: Secondary | ICD-10-CM | POA: Diagnosis not present

## 2021-07-10 DIAGNOSIS — Z87440 Personal history of urinary (tract) infections: Secondary | ICD-10-CM | POA: Diagnosis not present

## 2021-07-10 DIAGNOSIS — I69354 Hemiplegia and hemiparesis following cerebral infarction affecting left non-dominant side: Secondary | ICD-10-CM | POA: Diagnosis not present

## 2021-07-10 DIAGNOSIS — D509 Iron deficiency anemia, unspecified: Secondary | ICD-10-CM | POA: Diagnosis not present

## 2021-07-10 DIAGNOSIS — I69322 Dysarthria following cerebral infarction: Secondary | ICD-10-CM | POA: Diagnosis not present

## 2021-07-10 DIAGNOSIS — I10 Essential (primary) hypertension: Secondary | ICD-10-CM | POA: Diagnosis not present

## 2021-07-10 DIAGNOSIS — I69328 Other speech and language deficits following cerebral infarction: Secondary | ICD-10-CM | POA: Diagnosis not present

## 2021-07-10 DIAGNOSIS — E119 Type 2 diabetes mellitus without complications: Secondary | ICD-10-CM | POA: Diagnosis not present

## 2021-07-11 DIAGNOSIS — Z6834 Body mass index (BMI) 34.0-34.9, adult: Secondary | ICD-10-CM | POA: Diagnosis not present

## 2021-07-11 DIAGNOSIS — G40909 Epilepsy, unspecified, not intractable, without status epilepticus: Secondary | ICD-10-CM | POA: Diagnosis not present

## 2021-07-11 DIAGNOSIS — I10 Essential (primary) hypertension: Secondary | ICD-10-CM | POA: Diagnosis not present

## 2021-07-11 DIAGNOSIS — I69328 Other speech and language deficits following cerebral infarction: Secondary | ICD-10-CM | POA: Diagnosis not present

## 2021-07-11 DIAGNOSIS — M109 Gout, unspecified: Secondary | ICD-10-CM | POA: Diagnosis not present

## 2021-07-11 DIAGNOSIS — Z87440 Personal history of urinary (tract) infections: Secondary | ICD-10-CM | POA: Diagnosis not present

## 2021-07-11 DIAGNOSIS — E119 Type 2 diabetes mellitus without complications: Secondary | ICD-10-CM | POA: Diagnosis not present

## 2021-07-11 DIAGNOSIS — Z9049 Acquired absence of other specified parts of digestive tract: Secondary | ICD-10-CM | POA: Diagnosis not present

## 2021-07-11 DIAGNOSIS — I700 Atherosclerosis of aorta: Secondary | ICD-10-CM | POA: Diagnosis not present

## 2021-07-11 DIAGNOSIS — E785 Hyperlipidemia, unspecified: Secondary | ICD-10-CM | POA: Diagnosis not present

## 2021-07-11 DIAGNOSIS — D509 Iron deficiency anemia, unspecified: Secondary | ICD-10-CM | POA: Diagnosis not present

## 2021-07-11 DIAGNOSIS — I69322 Dysarthria following cerebral infarction: Secondary | ICD-10-CM | POA: Diagnosis not present

## 2021-07-11 DIAGNOSIS — E669 Obesity, unspecified: Secondary | ICD-10-CM | POA: Diagnosis not present

## 2021-07-11 DIAGNOSIS — I4891 Unspecified atrial fibrillation: Secondary | ICD-10-CM | POA: Diagnosis not present

## 2021-07-11 DIAGNOSIS — Z96642 Presence of left artificial hip joint: Secondary | ICD-10-CM | POA: Diagnosis not present

## 2021-07-11 DIAGNOSIS — I69354 Hemiplegia and hemiparesis following cerebral infarction affecting left non-dominant side: Secondary | ICD-10-CM | POA: Diagnosis not present

## 2021-07-14 DIAGNOSIS — L89300 Pressure ulcer of unspecified buttock, unstageable: Secondary | ICD-10-CM | POA: Diagnosis not present

## 2021-07-17 DIAGNOSIS — I10 Essential (primary) hypertension: Secondary | ICD-10-CM | POA: Diagnosis not present

## 2021-07-17 DIAGNOSIS — D509 Iron deficiency anemia, unspecified: Secondary | ICD-10-CM | POA: Diagnosis not present

## 2021-07-17 DIAGNOSIS — I69354 Hemiplegia and hemiparesis following cerebral infarction affecting left non-dominant side: Secondary | ICD-10-CM | POA: Diagnosis not present

## 2021-07-17 DIAGNOSIS — I69328 Other speech and language deficits following cerebral infarction: Secondary | ICD-10-CM | POA: Diagnosis not present

## 2021-07-17 DIAGNOSIS — Z9049 Acquired absence of other specified parts of digestive tract: Secondary | ICD-10-CM | POA: Diagnosis not present

## 2021-07-17 DIAGNOSIS — M109 Gout, unspecified: Secondary | ICD-10-CM | POA: Diagnosis not present

## 2021-07-17 DIAGNOSIS — I700 Atherosclerosis of aorta: Secondary | ICD-10-CM | POA: Diagnosis not present

## 2021-07-17 DIAGNOSIS — E669 Obesity, unspecified: Secondary | ICD-10-CM | POA: Diagnosis not present

## 2021-07-17 DIAGNOSIS — G40909 Epilepsy, unspecified, not intractable, without status epilepticus: Secondary | ICD-10-CM | POA: Diagnosis not present

## 2021-07-17 DIAGNOSIS — E119 Type 2 diabetes mellitus without complications: Secondary | ICD-10-CM | POA: Diagnosis not present

## 2021-07-17 DIAGNOSIS — Z87440 Personal history of urinary (tract) infections: Secondary | ICD-10-CM | POA: Diagnosis not present

## 2021-07-17 DIAGNOSIS — Z96642 Presence of left artificial hip joint: Secondary | ICD-10-CM | POA: Diagnosis not present

## 2021-07-17 DIAGNOSIS — I69322 Dysarthria following cerebral infarction: Secondary | ICD-10-CM | POA: Diagnosis not present

## 2021-07-17 DIAGNOSIS — E785 Hyperlipidemia, unspecified: Secondary | ICD-10-CM | POA: Diagnosis not present

## 2021-07-17 DIAGNOSIS — Z6834 Body mass index (BMI) 34.0-34.9, adult: Secondary | ICD-10-CM | POA: Diagnosis not present

## 2021-07-17 DIAGNOSIS — I4891 Unspecified atrial fibrillation: Secondary | ICD-10-CM | POA: Diagnosis not present

## 2021-07-18 DIAGNOSIS — D509 Iron deficiency anemia, unspecified: Secondary | ICD-10-CM | POA: Diagnosis not present

## 2021-07-18 DIAGNOSIS — E785 Hyperlipidemia, unspecified: Secondary | ICD-10-CM | POA: Diagnosis not present

## 2021-07-18 DIAGNOSIS — I69328 Other speech and language deficits following cerebral infarction: Secondary | ICD-10-CM | POA: Diagnosis not present

## 2021-07-18 DIAGNOSIS — I4891 Unspecified atrial fibrillation: Secondary | ICD-10-CM | POA: Diagnosis not present

## 2021-07-18 DIAGNOSIS — I10 Essential (primary) hypertension: Secondary | ICD-10-CM | POA: Diagnosis not present

## 2021-07-18 DIAGNOSIS — G40909 Epilepsy, unspecified, not intractable, without status epilepticus: Secondary | ICD-10-CM | POA: Diagnosis not present

## 2021-07-18 DIAGNOSIS — I69354 Hemiplegia and hemiparesis following cerebral infarction affecting left non-dominant side: Secondary | ICD-10-CM | POA: Diagnosis not present

## 2021-07-18 DIAGNOSIS — I700 Atherosclerosis of aorta: Secondary | ICD-10-CM | POA: Diagnosis not present

## 2021-07-18 DIAGNOSIS — Z96642 Presence of left artificial hip joint: Secondary | ICD-10-CM | POA: Diagnosis not present

## 2021-07-18 DIAGNOSIS — E669 Obesity, unspecified: Secondary | ICD-10-CM | POA: Diagnosis not present

## 2021-07-18 DIAGNOSIS — Z6834 Body mass index (BMI) 34.0-34.9, adult: Secondary | ICD-10-CM | POA: Diagnosis not present

## 2021-07-18 DIAGNOSIS — I69322 Dysarthria following cerebral infarction: Secondary | ICD-10-CM | POA: Diagnosis not present

## 2021-07-18 DIAGNOSIS — M109 Gout, unspecified: Secondary | ICD-10-CM | POA: Diagnosis not present

## 2021-07-18 DIAGNOSIS — E119 Type 2 diabetes mellitus without complications: Secondary | ICD-10-CM | POA: Diagnosis not present

## 2021-07-18 DIAGNOSIS — Z9049 Acquired absence of other specified parts of digestive tract: Secondary | ICD-10-CM | POA: Diagnosis not present

## 2021-07-18 DIAGNOSIS — Z87440 Personal history of urinary (tract) infections: Secondary | ICD-10-CM | POA: Diagnosis not present

## 2021-07-19 DIAGNOSIS — L89300 Pressure ulcer of unspecified buttock, unstageable: Secondary | ICD-10-CM | POA: Diagnosis not present

## 2021-07-22 DIAGNOSIS — M109 Gout, unspecified: Secondary | ICD-10-CM | POA: Diagnosis not present

## 2021-07-22 DIAGNOSIS — Z96642 Presence of left artificial hip joint: Secondary | ICD-10-CM | POA: Diagnosis not present

## 2021-07-22 DIAGNOSIS — E669 Obesity, unspecified: Secondary | ICD-10-CM | POA: Diagnosis not present

## 2021-07-22 DIAGNOSIS — I4891 Unspecified atrial fibrillation: Secondary | ICD-10-CM | POA: Diagnosis not present

## 2021-07-22 DIAGNOSIS — I69322 Dysarthria following cerebral infarction: Secondary | ICD-10-CM | POA: Diagnosis not present

## 2021-07-22 DIAGNOSIS — E119 Type 2 diabetes mellitus without complications: Secondary | ICD-10-CM | POA: Diagnosis not present

## 2021-07-22 DIAGNOSIS — E785 Hyperlipidemia, unspecified: Secondary | ICD-10-CM | POA: Diagnosis not present

## 2021-07-22 DIAGNOSIS — Z6834 Body mass index (BMI) 34.0-34.9, adult: Secondary | ICD-10-CM | POA: Diagnosis not present

## 2021-07-22 DIAGNOSIS — G40909 Epilepsy, unspecified, not intractable, without status epilepticus: Secondary | ICD-10-CM | POA: Diagnosis not present

## 2021-07-22 DIAGNOSIS — I69328 Other speech and language deficits following cerebral infarction: Secondary | ICD-10-CM | POA: Diagnosis not present

## 2021-07-22 DIAGNOSIS — I10 Essential (primary) hypertension: Secondary | ICD-10-CM | POA: Diagnosis not present

## 2021-07-22 DIAGNOSIS — I700 Atherosclerosis of aorta: Secondary | ICD-10-CM | POA: Diagnosis not present

## 2021-07-22 DIAGNOSIS — D509 Iron deficiency anemia, unspecified: Secondary | ICD-10-CM | POA: Diagnosis not present

## 2021-07-22 DIAGNOSIS — Z9049 Acquired absence of other specified parts of digestive tract: Secondary | ICD-10-CM | POA: Diagnosis not present

## 2021-07-22 DIAGNOSIS — Z87440 Personal history of urinary (tract) infections: Secondary | ICD-10-CM | POA: Diagnosis not present

## 2021-07-22 DIAGNOSIS — I69354 Hemiplegia and hemiparesis following cerebral infarction affecting left non-dominant side: Secondary | ICD-10-CM | POA: Diagnosis not present

## 2021-07-24 ENCOUNTER — Telehealth: Payer: Self-pay | Admitting: Emergency Medicine

## 2021-07-24 DIAGNOSIS — D509 Iron deficiency anemia, unspecified: Secondary | ICD-10-CM | POA: Diagnosis not present

## 2021-07-24 DIAGNOSIS — Z96642 Presence of left artificial hip joint: Secondary | ICD-10-CM | POA: Diagnosis not present

## 2021-07-24 DIAGNOSIS — Z6834 Body mass index (BMI) 34.0-34.9, adult: Secondary | ICD-10-CM | POA: Diagnosis not present

## 2021-07-24 DIAGNOSIS — M109 Gout, unspecified: Secondary | ICD-10-CM | POA: Diagnosis not present

## 2021-07-24 DIAGNOSIS — G40909 Epilepsy, unspecified, not intractable, without status epilepticus: Secondary | ICD-10-CM | POA: Diagnosis not present

## 2021-07-24 DIAGNOSIS — E669 Obesity, unspecified: Secondary | ICD-10-CM | POA: Diagnosis not present

## 2021-07-24 DIAGNOSIS — E119 Type 2 diabetes mellitus without complications: Secondary | ICD-10-CM | POA: Diagnosis not present

## 2021-07-24 DIAGNOSIS — E785 Hyperlipidemia, unspecified: Secondary | ICD-10-CM | POA: Diagnosis not present

## 2021-07-24 DIAGNOSIS — I10 Essential (primary) hypertension: Secondary | ICD-10-CM | POA: Diagnosis not present

## 2021-07-24 DIAGNOSIS — I69354 Hemiplegia and hemiparesis following cerebral infarction affecting left non-dominant side: Secondary | ICD-10-CM | POA: Diagnosis not present

## 2021-07-24 DIAGNOSIS — I4891 Unspecified atrial fibrillation: Secondary | ICD-10-CM | POA: Diagnosis not present

## 2021-07-24 DIAGNOSIS — I69328 Other speech and language deficits following cerebral infarction: Secondary | ICD-10-CM | POA: Diagnosis not present

## 2021-07-24 DIAGNOSIS — Z9049 Acquired absence of other specified parts of digestive tract: Secondary | ICD-10-CM | POA: Diagnosis not present

## 2021-07-24 DIAGNOSIS — I69322 Dysarthria following cerebral infarction: Secondary | ICD-10-CM | POA: Diagnosis not present

## 2021-07-24 DIAGNOSIS — Z87440 Personal history of urinary (tract) infections: Secondary | ICD-10-CM | POA: Diagnosis not present

## 2021-07-24 DIAGNOSIS — I700 Atherosclerosis of aorta: Secondary | ICD-10-CM | POA: Diagnosis not present

## 2021-07-24 NOTE — Telephone Encounter (Signed)
Copied from [place] [phone]. Topic: General - Other >> Jul 24, 2021  9:16 AM Cyndi Bender wrote: Reason for CRM: Princess with [facility] reports that patient had a fall on Sunday but there were no injuries. Cb# [phone]

## 2021-07-28 DIAGNOSIS — Z993 Dependence on wheelchair: Secondary | ICD-10-CM | POA: Diagnosis not present

## 2021-07-28 DIAGNOSIS — Z9181 History of falling: Secondary | ICD-10-CM | POA: Diagnosis not present

## 2021-07-28 DIAGNOSIS — I69398 Other sequelae of cerebral infarction: Secondary | ICD-10-CM | POA: Diagnosis not present

## 2021-07-28 DIAGNOSIS — I69354 Hemiplegia and hemiparesis following cerebral infarction affecting left non-dominant side: Secondary | ICD-10-CM | POA: Diagnosis not present

## 2021-07-29 DIAGNOSIS — D509 Iron deficiency anemia, unspecified: Secondary | ICD-10-CM | POA: Diagnosis not present

## 2021-07-29 DIAGNOSIS — I69328 Other speech and language deficits following cerebral infarction: Secondary | ICD-10-CM | POA: Diagnosis not present

## 2021-07-29 DIAGNOSIS — Z96642 Presence of left artificial hip joint: Secondary | ICD-10-CM | POA: Diagnosis not present

## 2021-07-29 DIAGNOSIS — Z87440 Personal history of urinary (tract) infections: Secondary | ICD-10-CM | POA: Diagnosis not present

## 2021-07-29 DIAGNOSIS — Z9049 Acquired absence of other specified parts of digestive tract: Secondary | ICD-10-CM | POA: Diagnosis not present

## 2021-07-29 DIAGNOSIS — G40909 Epilepsy, unspecified, not intractable, without status epilepticus: Secondary | ICD-10-CM | POA: Diagnosis not present

## 2021-07-29 DIAGNOSIS — M109 Gout, unspecified: Secondary | ICD-10-CM | POA: Diagnosis not present

## 2021-07-29 DIAGNOSIS — I4891 Unspecified atrial fibrillation: Secondary | ICD-10-CM | POA: Diagnosis not present

## 2021-07-29 DIAGNOSIS — E669 Obesity, unspecified: Secondary | ICD-10-CM | POA: Diagnosis not present

## 2021-07-29 DIAGNOSIS — I700 Atherosclerosis of aorta: Secondary | ICD-10-CM | POA: Diagnosis not present

## 2021-07-29 DIAGNOSIS — I10 Essential (primary) hypertension: Secondary | ICD-10-CM | POA: Diagnosis not present

## 2021-07-29 DIAGNOSIS — E785 Hyperlipidemia, unspecified: Secondary | ICD-10-CM | POA: Diagnosis not present

## 2021-07-29 DIAGNOSIS — I69322 Dysarthria following cerebral infarction: Secondary | ICD-10-CM | POA: Diagnosis not present

## 2021-07-29 DIAGNOSIS — I69354 Hemiplegia and hemiparesis following cerebral infarction affecting left non-dominant side: Secondary | ICD-10-CM | POA: Diagnosis not present

## 2021-07-29 DIAGNOSIS — Z6834 Body mass index (BMI) 34.0-34.9, adult: Secondary | ICD-10-CM | POA: Diagnosis not present

## 2021-07-29 DIAGNOSIS — E119 Type 2 diabetes mellitus without complications: Secondary | ICD-10-CM | POA: Diagnosis not present

## 2021-07-30 DIAGNOSIS — I700 Atherosclerosis of aorta: Secondary | ICD-10-CM | POA: Diagnosis not present

## 2021-07-30 DIAGNOSIS — Z87440 Personal history of urinary (tract) infections: Secondary | ICD-10-CM | POA: Diagnosis not present

## 2021-07-30 DIAGNOSIS — Z96642 Presence of left artificial hip joint: Secondary | ICD-10-CM | POA: Diagnosis not present

## 2021-07-30 DIAGNOSIS — E785 Hyperlipidemia, unspecified: Secondary | ICD-10-CM | POA: Diagnosis not present

## 2021-07-30 DIAGNOSIS — I69322 Dysarthria following cerebral infarction: Secondary | ICD-10-CM | POA: Diagnosis not present

## 2021-07-30 DIAGNOSIS — Z9049 Acquired absence of other specified parts of digestive tract: Secondary | ICD-10-CM | POA: Diagnosis not present

## 2021-07-30 DIAGNOSIS — I69354 Hemiplegia and hemiparesis following cerebral infarction affecting left non-dominant side: Secondary | ICD-10-CM | POA: Diagnosis not present

## 2021-07-30 DIAGNOSIS — I10 Essential (primary) hypertension: Secondary | ICD-10-CM | POA: Diagnosis not present

## 2021-07-30 DIAGNOSIS — D509 Iron deficiency anemia, unspecified: Secondary | ICD-10-CM | POA: Diagnosis not present

## 2021-07-30 DIAGNOSIS — Z6834 Body mass index (BMI) 34.0-34.9, adult: Secondary | ICD-10-CM | POA: Diagnosis not present

## 2021-07-30 DIAGNOSIS — M109 Gout, unspecified: Secondary | ICD-10-CM | POA: Diagnosis not present

## 2021-07-30 DIAGNOSIS — I4891 Unspecified atrial fibrillation: Secondary | ICD-10-CM | POA: Diagnosis not present

## 2021-07-30 DIAGNOSIS — E669 Obesity, unspecified: Secondary | ICD-10-CM | POA: Diagnosis not present

## 2021-07-30 DIAGNOSIS — E119 Type 2 diabetes mellitus without complications: Secondary | ICD-10-CM | POA: Diagnosis not present

## 2021-07-30 DIAGNOSIS — I69328 Other speech and language deficits following cerebral infarction: Secondary | ICD-10-CM | POA: Diagnosis not present

## 2021-07-30 DIAGNOSIS — G40909 Epilepsy, unspecified, not intractable, without status epilepticus: Secondary | ICD-10-CM | POA: Diagnosis not present

## 2021-08-08 ENCOUNTER — Telehealth: Payer: Self-pay | Admitting: Emergency Medicine

## 2021-08-08 DIAGNOSIS — I69322 Dysarthria following cerebral infarction: Secondary | ICD-10-CM | POA: Diagnosis not present

## 2021-08-08 DIAGNOSIS — M109 Gout, unspecified: Secondary | ICD-10-CM | POA: Diagnosis not present

## 2021-08-08 DIAGNOSIS — D509 Iron deficiency anemia, unspecified: Secondary | ICD-10-CM | POA: Diagnosis not present

## 2021-08-08 DIAGNOSIS — Z87440 Personal history of urinary (tract) infections: Secondary | ICD-10-CM | POA: Diagnosis not present

## 2021-08-08 DIAGNOSIS — E785 Hyperlipidemia, unspecified: Secondary | ICD-10-CM | POA: Diagnosis not present

## 2021-08-08 DIAGNOSIS — I69328 Other speech and language deficits following cerebral infarction: Secondary | ICD-10-CM | POA: Diagnosis not present

## 2021-08-08 DIAGNOSIS — I69354 Hemiplegia and hemiparesis following cerebral infarction affecting left non-dominant side: Secondary | ICD-10-CM | POA: Diagnosis not present

## 2021-08-08 DIAGNOSIS — E669 Obesity, unspecified: Secondary | ICD-10-CM | POA: Diagnosis not present

## 2021-08-08 DIAGNOSIS — I4891 Unspecified atrial fibrillation: Secondary | ICD-10-CM | POA: Diagnosis not present

## 2021-08-08 DIAGNOSIS — Z6834 Body mass index (BMI) 34.0-34.9, adult: Secondary | ICD-10-CM | POA: Diagnosis not present

## 2021-08-08 DIAGNOSIS — I10 Essential (primary) hypertension: Secondary | ICD-10-CM | POA: Diagnosis not present

## 2021-08-08 DIAGNOSIS — I700 Atherosclerosis of aorta: Secondary | ICD-10-CM | POA: Diagnosis not present

## 2021-08-08 DIAGNOSIS — Z96642 Presence of left artificial hip joint: Secondary | ICD-10-CM | POA: Diagnosis not present

## 2021-08-08 DIAGNOSIS — E119 Type 2 diabetes mellitus without complications: Secondary | ICD-10-CM | POA: Diagnosis not present

## 2021-08-08 DIAGNOSIS — G40909 Epilepsy, unspecified, not intractable, without status epilepticus: Secondary | ICD-10-CM | POA: Diagnosis not present

## 2021-08-08 DIAGNOSIS — Z9049 Acquired absence of other specified parts of digestive tract: Secondary | ICD-10-CM | POA: Diagnosis not present

## 2021-08-08 NOTE — Telephone Encounter (Signed)
Verbal orders were given for patient. 

## 2021-08-08 NOTE — Telephone Encounter (Signed)
Copied from [place] [phone]. Topic: Quick Communication - Home Health Verbal Orders >> Aug 08, 2021  9:54 AM [place] C wrote: Caller/Agency: [facility]  Callback Number: [phone] Requesting OT/PT/Skilled Nursing/Social Work/Speech Therapy: Speech Therapy  Frequency: Evaluation

## 2021-08-09 DIAGNOSIS — I69322 Dysarthria following cerebral infarction: Secondary | ICD-10-CM | POA: Diagnosis not present

## 2021-08-09 DIAGNOSIS — Z9049 Acquired absence of other specified parts of digestive tract: Secondary | ICD-10-CM | POA: Diagnosis not present

## 2021-08-09 DIAGNOSIS — Z87440 Personal history of urinary (tract) infections: Secondary | ICD-10-CM | POA: Diagnosis not present

## 2021-08-09 DIAGNOSIS — Z6834 Body mass index (BMI) 34.0-34.9, adult: Secondary | ICD-10-CM | POA: Diagnosis not present

## 2021-08-09 DIAGNOSIS — E785 Hyperlipidemia, unspecified: Secondary | ICD-10-CM | POA: Diagnosis not present

## 2021-08-09 DIAGNOSIS — D509 Iron deficiency anemia, unspecified: Secondary | ICD-10-CM | POA: Diagnosis not present

## 2021-08-09 DIAGNOSIS — G40909 Epilepsy, unspecified, not intractable, without status epilepticus: Secondary | ICD-10-CM | POA: Diagnosis not present

## 2021-08-09 DIAGNOSIS — I4891 Unspecified atrial fibrillation: Secondary | ICD-10-CM | POA: Diagnosis not present

## 2021-08-09 DIAGNOSIS — I69328 Other speech and language deficits following cerebral infarction: Secondary | ICD-10-CM | POA: Diagnosis not present

## 2021-08-09 DIAGNOSIS — I10 Essential (primary) hypertension: Secondary | ICD-10-CM | POA: Diagnosis not present

## 2021-08-09 DIAGNOSIS — E669 Obesity, unspecified: Secondary | ICD-10-CM | POA: Diagnosis not present

## 2021-08-09 DIAGNOSIS — Z96642 Presence of left artificial hip joint: Secondary | ICD-10-CM | POA: Diagnosis not present

## 2021-08-09 DIAGNOSIS — I700 Atherosclerosis of aorta: Secondary | ICD-10-CM | POA: Diagnosis not present

## 2021-08-09 DIAGNOSIS — M109 Gout, unspecified: Secondary | ICD-10-CM | POA: Diagnosis not present

## 2021-08-09 DIAGNOSIS — E119 Type 2 diabetes mellitus without complications: Secondary | ICD-10-CM | POA: Diagnosis not present

## 2021-08-09 DIAGNOSIS — I69354 Hemiplegia and hemiparesis following cerebral infarction affecting left non-dominant side: Secondary | ICD-10-CM | POA: Diagnosis not present

## 2021-08-12 ENCOUNTER — Telehealth: Payer: Self-pay | Admitting: Internal Medicine

## 2021-08-12 DIAGNOSIS — E785 Hyperlipidemia, unspecified: Secondary | ICD-10-CM | POA: Diagnosis not present

## 2021-08-12 DIAGNOSIS — I10 Essential (primary) hypertension: Secondary | ICD-10-CM | POA: Diagnosis not present

## 2021-08-12 DIAGNOSIS — I69354 Hemiplegia and hemiparesis following cerebral infarction affecting left non-dominant side: Secondary | ICD-10-CM | POA: Diagnosis not present

## 2021-08-12 DIAGNOSIS — E119 Type 2 diabetes mellitus without complications: Secondary | ICD-10-CM | POA: Diagnosis not present

## 2021-08-12 DIAGNOSIS — E669 Obesity, unspecified: Secondary | ICD-10-CM | POA: Diagnosis not present

## 2021-08-12 DIAGNOSIS — M109 Gout, unspecified: Secondary | ICD-10-CM | POA: Diagnosis not present

## 2021-08-12 DIAGNOSIS — I4891 Unspecified atrial fibrillation: Secondary | ICD-10-CM | POA: Diagnosis not present

## 2021-08-12 DIAGNOSIS — Z87440 Personal history of urinary (tract) infections: Secondary | ICD-10-CM | POA: Diagnosis not present

## 2021-08-12 DIAGNOSIS — D509 Iron deficiency anemia, unspecified: Secondary | ICD-10-CM | POA: Diagnosis not present

## 2021-08-12 DIAGNOSIS — Z96642 Presence of left artificial hip joint: Secondary | ICD-10-CM | POA: Diagnosis not present

## 2021-08-12 DIAGNOSIS — Z9049 Acquired absence of other specified parts of digestive tract: Secondary | ICD-10-CM | POA: Diagnosis not present

## 2021-08-12 DIAGNOSIS — Z6834 Body mass index (BMI) 34.0-34.9, adult: Secondary | ICD-10-CM | POA: Diagnosis not present

## 2021-08-12 DIAGNOSIS — I69322 Dysarthria following cerebral infarction: Secondary | ICD-10-CM | POA: Diagnosis not present

## 2021-08-12 DIAGNOSIS — I700 Atherosclerosis of aorta: Secondary | ICD-10-CM | POA: Diagnosis not present

## 2021-08-12 DIAGNOSIS — I69328 Other speech and language deficits following cerebral infarction: Secondary | ICD-10-CM | POA: Diagnosis not present

## 2021-08-12 DIAGNOSIS — G40909 Epilepsy, unspecified, not intractable, without status epilepticus: Secondary | ICD-10-CM | POA: Diagnosis not present

## 2021-08-12 NOTE — Telephone Encounter (Signed)
Home Health Verbal Orders - Caller/AgencyVolney Presser from [place] Number: [phone] [id number] OT Frequency:  1x1 /2x3

## 2021-08-12 NOTE — Telephone Encounter (Signed)
Called and left vm for Ms. [name]

## 2021-08-13 ENCOUNTER — Telehealth: Payer: Self-pay | Admitting: Emergency Medicine

## 2021-08-13 NOTE — Telephone Encounter (Signed)
Copied from [place] [phone]. Topic: General - Other >> Aug 13, 2021  4:07 PM Ludger Nutting wrote: Lenna Sciara with [facility] is requesting speech therapy for patient.

## 2021-08-13 NOTE — Telephone Encounter (Signed)
Left V.O. on voicemail for speech therapy.

## 2021-08-14 DIAGNOSIS — L89300 Pressure ulcer of unspecified buttock, unstageable: Secondary | ICD-10-CM | POA: Diagnosis not present

## 2021-08-15 ENCOUNTER — Telehealth: Payer: Self-pay | Admitting: Internal Medicine

## 2021-08-15 DIAGNOSIS — D509 Iron deficiency anemia, unspecified: Secondary | ICD-10-CM | POA: Diagnosis not present

## 2021-08-15 DIAGNOSIS — Z96642 Presence of left artificial hip joint: Secondary | ICD-10-CM | POA: Diagnosis not present

## 2021-08-15 DIAGNOSIS — I69322 Dysarthria following cerebral infarction: Secondary | ICD-10-CM | POA: Diagnosis not present

## 2021-08-15 DIAGNOSIS — G40909 Epilepsy, unspecified, not intractable, without status epilepticus: Secondary | ICD-10-CM | POA: Diagnosis not present

## 2021-08-15 DIAGNOSIS — I10 Essential (primary) hypertension: Secondary | ICD-10-CM | POA: Diagnosis not present

## 2021-08-15 DIAGNOSIS — E785 Hyperlipidemia, unspecified: Secondary | ICD-10-CM | POA: Diagnosis not present

## 2021-08-15 DIAGNOSIS — I700 Atherosclerosis of aorta: Secondary | ICD-10-CM | POA: Diagnosis not present

## 2021-08-15 DIAGNOSIS — I4891 Unspecified atrial fibrillation: Secondary | ICD-10-CM | POA: Diagnosis not present

## 2021-08-15 DIAGNOSIS — M109 Gout, unspecified: Secondary | ICD-10-CM | POA: Diagnosis not present

## 2021-08-15 DIAGNOSIS — Z87440 Personal history of urinary (tract) infections: Secondary | ICD-10-CM | POA: Diagnosis not present

## 2021-08-15 DIAGNOSIS — E119 Type 2 diabetes mellitus without complications: Secondary | ICD-10-CM | POA: Diagnosis not present

## 2021-08-15 DIAGNOSIS — E669 Obesity, unspecified: Secondary | ICD-10-CM | POA: Diagnosis not present

## 2021-08-15 DIAGNOSIS — Z6834 Body mass index (BMI) 34.0-34.9, adult: Secondary | ICD-10-CM | POA: Diagnosis not present

## 2021-08-15 DIAGNOSIS — I69354 Hemiplegia and hemiparesis following cerebral infarction affecting left non-dominant side: Secondary | ICD-10-CM | POA: Diagnosis not present

## 2021-08-15 DIAGNOSIS — I69328 Other speech and language deficits following cerebral infarction: Secondary | ICD-10-CM | POA: Diagnosis not present

## 2021-08-15 DIAGNOSIS — I69393 Ataxia following cerebral infarction: Secondary | ICD-10-CM | POA: Diagnosis not present

## 2021-08-15 NOTE — Telephone Encounter (Signed)
Home Health Verbal Orders - Caller/Agency: Sonia Baller with [facility] Number: [phone]  Requesting OT/PT/Skilled Nursing/Social Work/Speech Therapy: Home health speech therapy  Frequency: 1 week 4    Also has a question about a medication Diltiazem .  Pt has swallowing problems and this is a side effect of the medication

## 2021-08-19 DIAGNOSIS — Z6834 Body mass index (BMI) 34.0-34.9, adult: Secondary | ICD-10-CM | POA: Diagnosis not present

## 2021-08-19 DIAGNOSIS — E119 Type 2 diabetes mellitus without complications: Secondary | ICD-10-CM | POA: Diagnosis not present

## 2021-08-19 DIAGNOSIS — I69354 Hemiplegia and hemiparesis following cerebral infarction affecting left non-dominant side: Secondary | ICD-10-CM | POA: Diagnosis not present

## 2021-08-19 DIAGNOSIS — I69328 Other speech and language deficits following cerebral infarction: Secondary | ICD-10-CM | POA: Diagnosis not present

## 2021-08-19 DIAGNOSIS — G40909 Epilepsy, unspecified, not intractable, without status epilepticus: Secondary | ICD-10-CM | POA: Diagnosis not present

## 2021-08-19 DIAGNOSIS — I4891 Unspecified atrial fibrillation: Secondary | ICD-10-CM | POA: Diagnosis not present

## 2021-08-19 DIAGNOSIS — I10 Essential (primary) hypertension: Secondary | ICD-10-CM | POA: Diagnosis not present

## 2021-08-19 DIAGNOSIS — I69393 Ataxia following cerebral infarction: Secondary | ICD-10-CM | POA: Diagnosis not present

## 2021-08-19 DIAGNOSIS — E785 Hyperlipidemia, unspecified: Secondary | ICD-10-CM | POA: Diagnosis not present

## 2021-08-19 DIAGNOSIS — I69322 Dysarthria following cerebral infarction: Secondary | ICD-10-CM | POA: Diagnosis not present

## 2021-08-19 DIAGNOSIS — M109 Gout, unspecified: Secondary | ICD-10-CM | POA: Diagnosis not present

## 2021-08-19 DIAGNOSIS — Z87440 Personal history of urinary (tract) infections: Secondary | ICD-10-CM | POA: Diagnosis not present

## 2021-08-19 DIAGNOSIS — E669 Obesity, unspecified: Secondary | ICD-10-CM | POA: Diagnosis not present

## 2021-08-19 DIAGNOSIS — Z96642 Presence of left artificial hip joint: Secondary | ICD-10-CM | POA: Diagnosis not present

## 2021-08-19 DIAGNOSIS — D509 Iron deficiency anemia, unspecified: Secondary | ICD-10-CM | POA: Diagnosis not present

## 2021-08-19 DIAGNOSIS — I700 Atherosclerosis of aorta: Secondary | ICD-10-CM | POA: Diagnosis not present

## 2021-08-20 DIAGNOSIS — Z87440 Personal history of urinary (tract) infections: Secondary | ICD-10-CM | POA: Diagnosis not present

## 2021-08-20 DIAGNOSIS — I69328 Other speech and language deficits following cerebral infarction: Secondary | ICD-10-CM | POA: Diagnosis not present

## 2021-08-20 DIAGNOSIS — E119 Type 2 diabetes mellitus without complications: Secondary | ICD-10-CM | POA: Diagnosis not present

## 2021-08-20 DIAGNOSIS — D509 Iron deficiency anemia, unspecified: Secondary | ICD-10-CM | POA: Diagnosis not present

## 2021-08-20 DIAGNOSIS — I4891 Unspecified atrial fibrillation: Secondary | ICD-10-CM | POA: Diagnosis not present

## 2021-08-20 DIAGNOSIS — M109 Gout, unspecified: Secondary | ICD-10-CM | POA: Diagnosis not present

## 2021-08-20 DIAGNOSIS — I69322 Dysarthria following cerebral infarction: Secondary | ICD-10-CM | POA: Diagnosis not present

## 2021-08-20 DIAGNOSIS — G40909 Epilepsy, unspecified, not intractable, without status epilepticus: Secondary | ICD-10-CM | POA: Diagnosis not present

## 2021-08-20 DIAGNOSIS — I700 Atherosclerosis of aorta: Secondary | ICD-10-CM | POA: Diagnosis not present

## 2021-08-20 DIAGNOSIS — E785 Hyperlipidemia, unspecified: Secondary | ICD-10-CM | POA: Diagnosis not present

## 2021-08-20 DIAGNOSIS — I69354 Hemiplegia and hemiparesis following cerebral infarction affecting left non-dominant side: Secondary | ICD-10-CM | POA: Diagnosis not present

## 2021-08-20 DIAGNOSIS — I10 Essential (primary) hypertension: Secondary | ICD-10-CM | POA: Diagnosis not present

## 2021-08-20 DIAGNOSIS — I69393 Ataxia following cerebral infarction: Secondary | ICD-10-CM | POA: Diagnosis not present

## 2021-08-20 DIAGNOSIS — Z6834 Body mass index (BMI) 34.0-34.9, adult: Secondary | ICD-10-CM | POA: Diagnosis not present

## 2021-08-20 DIAGNOSIS — E669 Obesity, unspecified: Secondary | ICD-10-CM | POA: Diagnosis not present

## 2021-08-20 DIAGNOSIS — Z96642 Presence of left artificial hip joint: Secondary | ICD-10-CM | POA: Diagnosis not present

## 2021-08-20 NOTE — Telephone Encounter (Signed)
Verbal orders were given for patient. 

## 2021-08-26 DIAGNOSIS — E669 Obesity, unspecified: Secondary | ICD-10-CM | POA: Diagnosis not present

## 2021-08-26 DIAGNOSIS — I10 Essential (primary) hypertension: Secondary | ICD-10-CM | POA: Diagnosis not present

## 2021-08-26 DIAGNOSIS — E119 Type 2 diabetes mellitus without complications: Secondary | ICD-10-CM | POA: Diagnosis not present

## 2021-08-26 DIAGNOSIS — Z87440 Personal history of urinary (tract) infections: Secondary | ICD-10-CM | POA: Diagnosis not present

## 2021-08-26 DIAGNOSIS — I69322 Dysarthria following cerebral infarction: Secondary | ICD-10-CM | POA: Diagnosis not present

## 2021-08-26 DIAGNOSIS — Z6834 Body mass index (BMI) 34.0-34.9, adult: Secondary | ICD-10-CM | POA: Diagnosis not present

## 2021-08-26 DIAGNOSIS — I4891 Unspecified atrial fibrillation: Secondary | ICD-10-CM | POA: Diagnosis not present

## 2021-08-26 DIAGNOSIS — I69328 Other speech and language deficits following cerebral infarction: Secondary | ICD-10-CM | POA: Diagnosis not present

## 2021-08-26 DIAGNOSIS — E785 Hyperlipidemia, unspecified: Secondary | ICD-10-CM | POA: Diagnosis not present

## 2021-08-26 DIAGNOSIS — D509 Iron deficiency anemia, unspecified: Secondary | ICD-10-CM | POA: Diagnosis not present

## 2021-08-26 DIAGNOSIS — Z96642 Presence of left artificial hip joint: Secondary | ICD-10-CM | POA: Diagnosis not present

## 2021-08-26 DIAGNOSIS — I700 Atherosclerosis of aorta: Secondary | ICD-10-CM | POA: Diagnosis not present

## 2021-08-26 DIAGNOSIS — I69354 Hemiplegia and hemiparesis following cerebral infarction affecting left non-dominant side: Secondary | ICD-10-CM | POA: Diagnosis not present

## 2021-08-26 DIAGNOSIS — G40909 Epilepsy, unspecified, not intractable, without status epilepticus: Secondary | ICD-10-CM | POA: Diagnosis not present

## 2021-08-26 DIAGNOSIS — M109 Gout, unspecified: Secondary | ICD-10-CM | POA: Diagnosis not present

## 2021-08-26 DIAGNOSIS — I69393 Ataxia following cerebral infarction: Secondary | ICD-10-CM | POA: Diagnosis not present

## 2021-08-27 DIAGNOSIS — I10 Essential (primary) hypertension: Secondary | ICD-10-CM | POA: Diagnosis not present

## 2021-08-27 DIAGNOSIS — I69354 Hemiplegia and hemiparesis following cerebral infarction affecting left non-dominant side: Secondary | ICD-10-CM | POA: Diagnosis not present

## 2021-08-27 DIAGNOSIS — E785 Hyperlipidemia, unspecified: Secondary | ICD-10-CM | POA: Diagnosis not present

## 2021-08-27 DIAGNOSIS — Z96642 Presence of left artificial hip joint: Secondary | ICD-10-CM | POA: Diagnosis not present

## 2021-08-27 DIAGNOSIS — E119 Type 2 diabetes mellitus without complications: Secondary | ICD-10-CM | POA: Diagnosis not present

## 2021-08-27 DIAGNOSIS — I69322 Dysarthria following cerebral infarction: Secondary | ICD-10-CM | POA: Diagnosis not present

## 2021-08-27 DIAGNOSIS — D509 Iron deficiency anemia, unspecified: Secondary | ICD-10-CM | POA: Diagnosis not present

## 2021-08-27 DIAGNOSIS — I4891 Unspecified atrial fibrillation: Secondary | ICD-10-CM | POA: Diagnosis not present

## 2021-08-27 DIAGNOSIS — G40909 Epilepsy, unspecified, not intractable, without status epilepticus: Secondary | ICD-10-CM | POA: Diagnosis not present

## 2021-08-27 DIAGNOSIS — Z6834 Body mass index (BMI) 34.0-34.9, adult: Secondary | ICD-10-CM | POA: Diagnosis not present

## 2021-08-27 DIAGNOSIS — I69393 Ataxia following cerebral infarction: Secondary | ICD-10-CM | POA: Diagnosis not present

## 2021-08-27 DIAGNOSIS — M109 Gout, unspecified: Secondary | ICD-10-CM | POA: Diagnosis not present

## 2021-08-27 DIAGNOSIS — Z87440 Personal history of urinary (tract) infections: Secondary | ICD-10-CM | POA: Diagnosis not present

## 2021-08-27 DIAGNOSIS — I700 Atherosclerosis of aorta: Secondary | ICD-10-CM | POA: Diagnosis not present

## 2021-08-27 DIAGNOSIS — I69328 Other speech and language deficits following cerebral infarction: Secondary | ICD-10-CM | POA: Diagnosis not present

## 2021-08-27 DIAGNOSIS — E669 Obesity, unspecified: Secondary | ICD-10-CM | POA: Diagnosis not present

## 2021-08-28 DIAGNOSIS — Z993 Dependence on wheelchair: Secondary | ICD-10-CM | POA: Diagnosis not present

## 2021-08-28 DIAGNOSIS — I69354 Hemiplegia and hemiparesis following cerebral infarction affecting left non-dominant side: Secondary | ICD-10-CM | POA: Diagnosis not present

## 2021-08-28 DIAGNOSIS — I69398 Other sequelae of cerebral infarction: Secondary | ICD-10-CM | POA: Diagnosis not present

## 2021-08-28 DIAGNOSIS — Z9181 History of falling: Secondary | ICD-10-CM | POA: Diagnosis not present

## 2021-08-29 DIAGNOSIS — G40909 Epilepsy, unspecified, not intractable, without status epilepticus: Secondary | ICD-10-CM | POA: Diagnosis not present

## 2021-08-29 DIAGNOSIS — E669 Obesity, unspecified: Secondary | ICD-10-CM | POA: Diagnosis not present

## 2021-08-29 DIAGNOSIS — D509 Iron deficiency anemia, unspecified: Secondary | ICD-10-CM | POA: Diagnosis not present

## 2021-08-29 DIAGNOSIS — Z96642 Presence of left artificial hip joint: Secondary | ICD-10-CM | POA: Diagnosis not present

## 2021-08-29 DIAGNOSIS — I4891 Unspecified atrial fibrillation: Secondary | ICD-10-CM | POA: Diagnosis not present

## 2021-08-29 DIAGNOSIS — Z6834 Body mass index (BMI) 34.0-34.9, adult: Secondary | ICD-10-CM | POA: Diagnosis not present

## 2021-08-29 DIAGNOSIS — E119 Type 2 diabetes mellitus without complications: Secondary | ICD-10-CM | POA: Diagnosis not present

## 2021-08-29 DIAGNOSIS — I10 Essential (primary) hypertension: Secondary | ICD-10-CM | POA: Diagnosis not present

## 2021-08-29 DIAGNOSIS — L89894 Pressure ulcer of other site, stage 4: Secondary | ICD-10-CM | POA: Diagnosis not present

## 2021-08-29 DIAGNOSIS — I700 Atherosclerosis of aorta: Secondary | ICD-10-CM | POA: Diagnosis not present

## 2021-08-29 DIAGNOSIS — Z87440 Personal history of urinary (tract) infections: Secondary | ICD-10-CM | POA: Diagnosis not present

## 2021-08-29 DIAGNOSIS — I69354 Hemiplegia and hemiparesis following cerebral infarction affecting left non-dominant side: Secondary | ICD-10-CM | POA: Diagnosis not present

## 2021-08-29 DIAGNOSIS — I69393 Ataxia following cerebral infarction: Secondary | ICD-10-CM | POA: Diagnosis not present

## 2021-08-29 DIAGNOSIS — I69328 Other speech and language deficits following cerebral infarction: Secondary | ICD-10-CM | POA: Diagnosis not present

## 2021-08-29 DIAGNOSIS — M109 Gout, unspecified: Secondary | ICD-10-CM | POA: Diagnosis not present

## 2021-08-29 DIAGNOSIS — E785 Hyperlipidemia, unspecified: Secondary | ICD-10-CM | POA: Diagnosis not present

## 2021-08-29 DIAGNOSIS — I69322 Dysarthria following cerebral infarction: Secondary | ICD-10-CM | POA: Diagnosis not present

## 2021-09-03 DIAGNOSIS — D509 Iron deficiency anemia, unspecified: Secondary | ICD-10-CM | POA: Diagnosis not present

## 2021-09-03 DIAGNOSIS — E785 Hyperlipidemia, unspecified: Secondary | ICD-10-CM | POA: Diagnosis not present

## 2021-09-03 DIAGNOSIS — E669 Obesity, unspecified: Secondary | ICD-10-CM | POA: Diagnosis not present

## 2021-09-03 DIAGNOSIS — I69322 Dysarthria following cerebral infarction: Secondary | ICD-10-CM | POA: Diagnosis not present

## 2021-09-03 DIAGNOSIS — M109 Gout, unspecified: Secondary | ICD-10-CM | POA: Diagnosis not present

## 2021-09-03 DIAGNOSIS — I69393 Ataxia following cerebral infarction: Secondary | ICD-10-CM | POA: Diagnosis not present

## 2021-09-03 DIAGNOSIS — I69328 Other speech and language deficits following cerebral infarction: Secondary | ICD-10-CM | POA: Diagnosis not present

## 2021-09-03 DIAGNOSIS — G40909 Epilepsy, unspecified, not intractable, without status epilepticus: Secondary | ICD-10-CM | POA: Diagnosis not present

## 2021-09-03 DIAGNOSIS — I10 Essential (primary) hypertension: Secondary | ICD-10-CM | POA: Diagnosis not present

## 2021-09-03 DIAGNOSIS — I4891 Unspecified atrial fibrillation: Secondary | ICD-10-CM | POA: Diagnosis not present

## 2021-09-03 DIAGNOSIS — Z96642 Presence of left artificial hip joint: Secondary | ICD-10-CM | POA: Diagnosis not present

## 2021-09-03 DIAGNOSIS — I700 Atherosclerosis of aorta: Secondary | ICD-10-CM | POA: Diagnosis not present

## 2021-09-03 DIAGNOSIS — I69354 Hemiplegia and hemiparesis following cerebral infarction affecting left non-dominant side: Secondary | ICD-10-CM | POA: Diagnosis not present

## 2021-09-03 DIAGNOSIS — Z6834 Body mass index (BMI) 34.0-34.9, adult: Secondary | ICD-10-CM | POA: Diagnosis not present

## 2021-09-03 DIAGNOSIS — E119 Type 2 diabetes mellitus without complications: Secondary | ICD-10-CM | POA: Diagnosis not present

## 2021-09-03 DIAGNOSIS — Z87440 Personal history of urinary (tract) infections: Secondary | ICD-10-CM | POA: Diagnosis not present

## 2021-09-04 DIAGNOSIS — I69393 Ataxia following cerebral infarction: Secondary | ICD-10-CM | POA: Diagnosis not present

## 2021-09-04 DIAGNOSIS — I4891 Unspecified atrial fibrillation: Secondary | ICD-10-CM | POA: Diagnosis not present

## 2021-09-04 DIAGNOSIS — E669 Obesity, unspecified: Secondary | ICD-10-CM | POA: Diagnosis not present

## 2021-09-04 DIAGNOSIS — E785 Hyperlipidemia, unspecified: Secondary | ICD-10-CM | POA: Diagnosis not present

## 2021-09-04 DIAGNOSIS — M109 Gout, unspecified: Secondary | ICD-10-CM | POA: Diagnosis not present

## 2021-09-04 DIAGNOSIS — G40909 Epilepsy, unspecified, not intractable, without status epilepticus: Secondary | ICD-10-CM | POA: Diagnosis not present

## 2021-09-04 DIAGNOSIS — I69354 Hemiplegia and hemiparesis following cerebral infarction affecting left non-dominant side: Secondary | ICD-10-CM | POA: Diagnosis not present

## 2021-09-04 DIAGNOSIS — Z96642 Presence of left artificial hip joint: Secondary | ICD-10-CM | POA: Diagnosis not present

## 2021-09-04 DIAGNOSIS — I69328 Other speech and language deficits following cerebral infarction: Secondary | ICD-10-CM | POA: Diagnosis not present

## 2021-09-04 DIAGNOSIS — Z6834 Body mass index (BMI) 34.0-34.9, adult: Secondary | ICD-10-CM | POA: Diagnosis not present

## 2021-09-04 DIAGNOSIS — Z87440 Personal history of urinary (tract) infections: Secondary | ICD-10-CM | POA: Diagnosis not present

## 2021-09-04 DIAGNOSIS — I10 Essential (primary) hypertension: Secondary | ICD-10-CM | POA: Diagnosis not present

## 2021-09-04 DIAGNOSIS — E119 Type 2 diabetes mellitus without complications: Secondary | ICD-10-CM | POA: Diagnosis not present

## 2021-09-04 DIAGNOSIS — D509 Iron deficiency anemia, unspecified: Secondary | ICD-10-CM | POA: Diagnosis not present

## 2021-09-04 DIAGNOSIS — I69322 Dysarthria following cerebral infarction: Secondary | ICD-10-CM | POA: Diagnosis not present

## 2021-09-04 DIAGNOSIS — I700 Atherosclerosis of aorta: Secondary | ICD-10-CM | POA: Diagnosis not present

## 2021-09-06 DIAGNOSIS — I69328 Other speech and language deficits following cerebral infarction: Secondary | ICD-10-CM | POA: Diagnosis not present

## 2021-09-06 DIAGNOSIS — I10 Essential (primary) hypertension: Secondary | ICD-10-CM | POA: Diagnosis not present

## 2021-09-06 DIAGNOSIS — M109 Gout, unspecified: Secondary | ICD-10-CM | POA: Diagnosis not present

## 2021-09-06 DIAGNOSIS — E119 Type 2 diabetes mellitus without complications: Secondary | ICD-10-CM | POA: Diagnosis not present

## 2021-09-06 DIAGNOSIS — I69354 Hemiplegia and hemiparesis following cerebral infarction affecting left non-dominant side: Secondary | ICD-10-CM | POA: Diagnosis not present

## 2021-09-06 DIAGNOSIS — Z96642 Presence of left artificial hip joint: Secondary | ICD-10-CM | POA: Diagnosis not present

## 2021-09-06 DIAGNOSIS — I700 Atherosclerosis of aorta: Secondary | ICD-10-CM | POA: Diagnosis not present

## 2021-09-06 DIAGNOSIS — D509 Iron deficiency anemia, unspecified: Secondary | ICD-10-CM | POA: Diagnosis not present

## 2021-09-06 DIAGNOSIS — E669 Obesity, unspecified: Secondary | ICD-10-CM | POA: Diagnosis not present

## 2021-09-06 DIAGNOSIS — G40909 Epilepsy, unspecified, not intractable, without status epilepticus: Secondary | ICD-10-CM | POA: Diagnosis not present

## 2021-09-06 DIAGNOSIS — I69322 Dysarthria following cerebral infarction: Secondary | ICD-10-CM | POA: Diagnosis not present

## 2021-09-06 DIAGNOSIS — I69393 Ataxia following cerebral infarction: Secondary | ICD-10-CM | POA: Diagnosis not present

## 2021-09-06 DIAGNOSIS — Z6834 Body mass index (BMI) 34.0-34.9, adult: Secondary | ICD-10-CM | POA: Diagnosis not present

## 2021-09-06 DIAGNOSIS — E785 Hyperlipidemia, unspecified: Secondary | ICD-10-CM | POA: Diagnosis not present

## 2021-09-06 DIAGNOSIS — I4891 Unspecified atrial fibrillation: Secondary | ICD-10-CM | POA: Diagnosis not present

## 2021-09-06 DIAGNOSIS — Z87440 Personal history of urinary (tract) infections: Secondary | ICD-10-CM | POA: Diagnosis not present

## 2021-09-09 DIAGNOSIS — Z6834 Body mass index (BMI) 34.0-34.9, adult: Secondary | ICD-10-CM | POA: Diagnosis not present

## 2021-09-09 DIAGNOSIS — I69354 Hemiplegia and hemiparesis following cerebral infarction affecting left non-dominant side: Secondary | ICD-10-CM | POA: Diagnosis not present

## 2021-09-09 DIAGNOSIS — I69328 Other speech and language deficits following cerebral infarction: Secondary | ICD-10-CM | POA: Diagnosis not present

## 2021-09-09 DIAGNOSIS — I700 Atherosclerosis of aorta: Secondary | ICD-10-CM | POA: Diagnosis not present

## 2021-09-09 DIAGNOSIS — E669 Obesity, unspecified: Secondary | ICD-10-CM | POA: Diagnosis not present

## 2021-09-09 DIAGNOSIS — G40909 Epilepsy, unspecified, not intractable, without status epilepticus: Secondary | ICD-10-CM | POA: Diagnosis not present

## 2021-09-09 DIAGNOSIS — I69393 Ataxia following cerebral infarction: Secondary | ICD-10-CM | POA: Diagnosis not present

## 2021-09-09 DIAGNOSIS — Z87440 Personal history of urinary (tract) infections: Secondary | ICD-10-CM | POA: Diagnosis not present

## 2021-09-09 DIAGNOSIS — Z96642 Presence of left artificial hip joint: Secondary | ICD-10-CM | POA: Diagnosis not present

## 2021-09-09 DIAGNOSIS — M109 Gout, unspecified: Secondary | ICD-10-CM | POA: Diagnosis not present

## 2021-09-09 DIAGNOSIS — E785 Hyperlipidemia, unspecified: Secondary | ICD-10-CM | POA: Diagnosis not present

## 2021-09-09 DIAGNOSIS — I4891 Unspecified atrial fibrillation: Secondary | ICD-10-CM | POA: Diagnosis not present

## 2021-09-09 DIAGNOSIS — E119 Type 2 diabetes mellitus without complications: Secondary | ICD-10-CM | POA: Diagnosis not present

## 2021-09-09 DIAGNOSIS — D509 Iron deficiency anemia, unspecified: Secondary | ICD-10-CM | POA: Diagnosis not present

## 2021-09-09 DIAGNOSIS — I69322 Dysarthria following cerebral infarction: Secondary | ICD-10-CM | POA: Diagnosis not present

## 2021-09-09 DIAGNOSIS — I10 Essential (primary) hypertension: Secondary | ICD-10-CM | POA: Diagnosis not present

## 2021-09-12 DIAGNOSIS — I69354 Hemiplegia and hemiparesis following cerebral infarction affecting left non-dominant side: Secondary | ICD-10-CM | POA: Diagnosis not present

## 2021-09-12 DIAGNOSIS — M109 Gout, unspecified: Secondary | ICD-10-CM | POA: Diagnosis not present

## 2021-09-12 DIAGNOSIS — Z6834 Body mass index (BMI) 34.0-34.9, adult: Secondary | ICD-10-CM | POA: Diagnosis not present

## 2021-09-12 DIAGNOSIS — I69322 Dysarthria following cerebral infarction: Secondary | ICD-10-CM | POA: Diagnosis not present

## 2021-09-12 DIAGNOSIS — I69393 Ataxia following cerebral infarction: Secondary | ICD-10-CM | POA: Diagnosis not present

## 2021-09-12 DIAGNOSIS — I10 Essential (primary) hypertension: Secondary | ICD-10-CM | POA: Diagnosis not present

## 2021-09-12 DIAGNOSIS — D509 Iron deficiency anemia, unspecified: Secondary | ICD-10-CM | POA: Diagnosis not present

## 2021-09-12 DIAGNOSIS — Z87440 Personal history of urinary (tract) infections: Secondary | ICD-10-CM | POA: Diagnosis not present

## 2021-09-12 DIAGNOSIS — I700 Atherosclerosis of aorta: Secondary | ICD-10-CM | POA: Diagnosis not present

## 2021-09-12 DIAGNOSIS — G40909 Epilepsy, unspecified, not intractable, without status epilepticus: Secondary | ICD-10-CM | POA: Diagnosis not present

## 2021-09-12 DIAGNOSIS — I4891 Unspecified atrial fibrillation: Secondary | ICD-10-CM | POA: Diagnosis not present

## 2021-09-12 DIAGNOSIS — Z96642 Presence of left artificial hip joint: Secondary | ICD-10-CM | POA: Diagnosis not present

## 2021-09-12 DIAGNOSIS — E785 Hyperlipidemia, unspecified: Secondary | ICD-10-CM | POA: Diagnosis not present

## 2021-09-12 DIAGNOSIS — E669 Obesity, unspecified: Secondary | ICD-10-CM | POA: Diagnosis not present

## 2021-09-12 DIAGNOSIS — E119 Type 2 diabetes mellitus without complications: Secondary | ICD-10-CM | POA: Diagnosis not present

## 2021-09-12 DIAGNOSIS — I69328 Other speech and language deficits following cerebral infarction: Secondary | ICD-10-CM | POA: Diagnosis not present

## 2021-09-14 DIAGNOSIS — L89300 Pressure ulcer of unspecified buttock, unstageable: Secondary | ICD-10-CM | POA: Diagnosis not present

## 2021-09-28 DIAGNOSIS — I69398 Other sequelae of cerebral infarction: Secondary | ICD-10-CM | POA: Diagnosis not present

## 2021-09-28 DIAGNOSIS — I69354 Hemiplegia and hemiparesis following cerebral infarction affecting left non-dominant side: Secondary | ICD-10-CM | POA: Diagnosis not present

## 2021-09-28 DIAGNOSIS — Z993 Dependence on wheelchair: Secondary | ICD-10-CM | POA: Diagnosis not present

## 2021-09-28 DIAGNOSIS — Z9181 History of falling: Secondary | ICD-10-CM | POA: Diagnosis not present

## 2021-09-30 ENCOUNTER — Other Ambulatory Visit: Payer: Self-pay | Admitting: Internal Medicine

## 2021-09-30 ENCOUNTER — Other Ambulatory Visit: Payer: Self-pay | Admitting: Physical Medicine and Rehabilitation

## 2021-10-14 ENCOUNTER — Other Ambulatory Visit: Payer: Self-pay | Admitting: Physical Medicine and Rehabilitation

## 2021-10-14 ENCOUNTER — Other Ambulatory Visit: Payer: Self-pay | Admitting: Internal Medicine

## 2021-10-14 DIAGNOSIS — L89300 Pressure ulcer of unspecified buttock, unstageable: Secondary | ICD-10-CM | POA: Diagnosis not present

## 2021-10-14 DIAGNOSIS — I6930 Unspecified sequelae of cerebral infarction: Secondary | ICD-10-CM

## 2021-10-15 NOTE — Telephone Encounter (Signed)
Requested medication (s) are due for refill today: yes  Requested medication (s) are on the active medication list: yes  Last refill:  01/09/21 #90/1  Future visit scheduled: no  Notes to clinic:  Unable to refill per protocol due to failed labs, no updated results.      Requested Prescriptions  Pending Prescriptions Disp Refills   atorvastatin (LIPITOR) 80 MG tablet [Pharmacy Med Name: ATORVASTATIN 80 MG TABLET] 90 tablet 1    Sig: TAKE 1 TABLET BY MOUTH EVERY DAY     Cardiovascular:  Antilipid - Statins Failed - 10/14/2021  1:53 PM      Failed - Lipid Panel in normal range within the last 12 months    Cholesterol, Total  Date Value Ref Range Status  06/07/2020 141 100 - 199 mg/dL Final   LDL Chol Calc (NIH)  Date Value Ref Range Status  06/07/2020 82 0 - 99 mg/dL Final   HDL  Date Value Ref Range Status  06/07/2020 44 >39 mg/dL Final   Triglycerides  Date Value Ref Range Status  06/07/2020 75 0 - 149 mg/dL Final         Passed - Patient is not pregnant      Passed - Valid encounter within last 12 months    Recent Outpatient Visits           4 months ago Hemiparesis affecting left side as late effect of cerebrovascular accident (CVA) ([place])   [place], MD   5 months ago Appointment canceled by hospital   [place], MD   5 months ago Appointment canceled by hospital   [place] Ladell Pier, MD   5 months ago Hip pain, acute, right   [place], MD   8 months ago [place], Vernia Buff, NP

## 2021-10-21 ENCOUNTER — Other Ambulatory Visit: Payer: Self-pay | Admitting: Internal Medicine

## 2021-10-21 DIAGNOSIS — I480 Paroxysmal atrial fibrillation: Secondary | ICD-10-CM

## 2021-10-28 DIAGNOSIS — I69354 Hemiplegia and hemiparesis following cerebral infarction affecting left non-dominant side: Secondary | ICD-10-CM | POA: Diagnosis not present

## 2021-10-28 DIAGNOSIS — Z9181 History of falling: Secondary | ICD-10-CM | POA: Diagnosis not present

## 2021-10-28 DIAGNOSIS — I69398 Other sequelae of cerebral infarction: Secondary | ICD-10-CM | POA: Diagnosis not present

## 2021-10-28 DIAGNOSIS — Z993 Dependence on wheelchair: Secondary | ICD-10-CM | POA: Diagnosis not present

## 2021-11-10 ENCOUNTER — Other Ambulatory Visit: Payer: Self-pay | Admitting: Internal Medicine

## 2021-11-10 ENCOUNTER — Other Ambulatory Visit: Payer: Self-pay | Admitting: Physical Medicine and Rehabilitation

## 2021-11-10 ENCOUNTER — Other Ambulatory Visit: Payer: Self-pay | Admitting: Infectious Disease

## 2021-11-10 DIAGNOSIS — I6930 Unspecified sequelae of cerebral infarction: Secondary | ICD-10-CM

## 2021-11-10 DIAGNOSIS — I480 Paroxysmal atrial fibrillation: Secondary | ICD-10-CM

## 2021-11-11 NOTE — Telephone Encounter (Signed)
Requested Prescriptions  Pending Prescriptions Disp Refills  . amiodarone (PACERONE) 200 MG tablet [Pharmacy Med Name: AMIODARONE HCL 200 MG TABLET] 90 tablet     Sig: TAKE 1 TABLET BY MOUTH EVERY DAY     Not Delegated - Cardiovascular: Antiarrhythmic Agents - amiodarone Failed - 11/10/2021  2:06 PM      Failed - This refill cannot be delegated      Failed - Manual Review: Eye exam recommended every 12 months      Failed - TSH in normal range and within 360 days    TSH  Date Value Ref Range Status  11/14/2020 1.076 0.350 - 4.500 uIU/mL Final    Comment:    Performed by a 3rd Generation assay with a functional sensitivity of <=0.01 uIU/mL. Performed at [facility], [place] [address], [place], [place] [address]          Failed - K in normal range and within 180 days    Potassium  Date Value Ref Range Status  04/16/2021 4.4 3.5 - 5.3 mmol/L Final         Failed - AST in normal range and within 180 days    AST  Date Value Ref Range Status  02/19/2021 19 10 - 35 U/L Final         Failed - ALT in normal range and within 180 days    ALT  Date Value Ref Range Status  02/19/2021 25 9 - 46 U/L Final         Failed - Patient had ECG in the last 180 days      Failed - Patient had chest x-ray within the last 6 months      Passed - Mg Level in normal range and within 360 days    Magnesium  Date Value Ref Range Status  11/17/2020 2.4 1.7 - 2.4 mg/dL Final    Comment:    Performed at [facility], [place] [address], [place], [place] [address]         Passed - Patient is not pregnant      Passed - Last BP in normal range    BP Readings from Last 1 Encounters:  05/28/21 128/84         Passed - Last Heart Rate in normal range    Pulse Readings from Last 1 Encounters:  05/28/21 77         Passed - Valid encounter within last 6 months    Recent Outpatient Visits          5 months ago Hemiparesis affecting left side as late effect of cerebrovascular accident (CVA)  ([place])   [place], MD   5 months ago Appointment canceled by hospital   [place], MD   5 months ago Appointment canceled by hospital   [place], MD   6 months ago Hip pain, acute, right   [place], MD   9 months ago [place], Vernia Buff, NP      Future Appointments            In 1 month [place], Dionne Bucy, PA-C [place]           . carvedilol (COREG) 12.5 MG tablet [Pharmacy Med Name:  CARVEDILOL 12.5 MG TABLET] 60 tablet 0    Sig: TAKE 1 TABLET (12.'5MG'$  TOTAL) BY MOUTH TWICE A DAY WITH MEALS     Cardiovascular: Beta Blockers 3 Failed - 11/10/2021  2:06 PM      Failed - Cr in normal range and within 360 days    Creat  Date Value Ref Range Status  04/16/2021 1.61 (H) 0.70 - 1.30 mg/dL Final         Passed - AST in normal range and within 360 days    AST  Date Value Ref Range Status  02/19/2021 19 10 - 35 U/L Final         Passed - ALT in normal range and within 360 days    ALT  Date Value Ref Range Status  02/19/2021 25 9 - 46 U/L Final         Passed - Last BP in normal range    BP Readings from Last 1 Encounters:  05/28/21 128/84         Passed - Last Heart Rate in normal range    Pulse Readings from Last 1 Encounters:  05/28/21 77         Passed - Valid encounter within last 6 months    Recent Outpatient Visits          5 months ago Hemiparesis affecting left side as late effect of cerebrovascular accident (CVA) ([place])   [place], MD   5 months ago Appointment canceled by hospital   [place], MD   5 months ago Appointment canceled by hospital   [place], MD   6 months ago Hip pain, acute, right   [place], MD   9 months ago [place], Vernia Buff, NP      Future Appointments            In 1 month [place], Dionne Bucy, [place]           . atorvastatin (LIPITOR) 80 MG tablet [Pharmacy Med Name: ATORVASTATIN 80 MG TABLET] 90 tablet 0    Sig: TAKE 1 TABLET BY MOUTH EVERY DAY     Cardiovascular:  Antilipid - Statins Failed - 11/10/2021  2:06 PM      Failed - Lipid Panel in normal range within the last 12 months    Cholesterol, Total  Date Value Ref Range Status  06/07/2020 141 100 - 199 mg/dL Final   LDL Chol Calc (NIH)  Date Value Ref Range Status  06/07/2020 82 0 - 99 mg/dL Final   HDL  Date Value Ref Range Status  06/07/2020 44 >39 mg/dL Final   Triglycerides  Date Value Ref Range Status  06/07/2020 75 0 - 149 mg/dL Final         Passed - Patient is not pregnant      Passed - Valid encounter within last 12 months    Recent Outpatient Visits          5 months ago Hemiparesis affecting left side as late effect of cerebrovascular accident (CVA) ([place])   [place] Ladell Pier, MD   5 months ago Appointment canceled by hospital   [place] Ladell Pier, MD  5 months ago Appointment canceled by hospital   [place], MD   6 months ago Hip pain, acute, right   [place], MD   9 months ago [place] Gildardo Pounds, NP      Future Appointments            In 1 month [place], Dionne Bucy, PA-C [place]

## 2021-11-11 NOTE — Telephone Encounter (Signed)
Requested medications are due for refill today.  yes  Requested medications are on the active medications list.  yes  Last refill. Varied  Future visit scheduled.   yes  Notes to clinic.  Labs are expired. Medication refill not delegated.    Requested Prescriptions  Pending Prescriptions Disp Refills   amiodarone (PACERONE) 200 MG tablet [Pharmacy Med Name: AMIODARONE HCL 200 MG TABLET] 90 tablet     Sig: TAKE 1 TABLET BY MOUTH EVERY DAY     Not Delegated - Cardiovascular: Antiarrhythmic Agents - amiodarone Failed - 11/10/2021  2:06 PM      Failed - This refill cannot be delegated      Failed - Manual Review: Eye exam recommended every 12 months      Failed - TSH in normal range and within 360 days    TSH  Date Value Ref Range Status  11/14/2020 1.076 0.350 - 4.500 uIU/mL Final    Comment:    Performed by a 3rd Generation assay with a functional sensitivity of <=0.01 uIU/mL. Performed at [facility], [place] [address], [place], [place] [address]          Failed - K in normal range and within 180 days    Potassium  Date Value Ref Range Status  04/16/2021 4.4 3.5 - 5.3 mmol/L Final         Failed - AST in normal range and within 180 days    AST  Date Value Ref Range Status  02/19/2021 19 10 - 35 U/L Final         Failed - ALT in normal range and within 180 days    ALT  Date Value Ref Range Status  02/19/2021 25 9 - 46 U/L Final         Failed - Patient had ECG in the last 180 days      Failed - Patient had chest x-ray within the last 6 months      Passed - Mg Level in normal range and within 360 days    Magnesium  Date Value Ref Range Status  11/17/2020 2.4 1.7 - 2.4 mg/dL Final    Comment:    Performed at [facility], [place] [address] [place], [place] [address]         Passed - Patient is not pregnant      Passed - Last BP in normal range    BP Readings from Last 1 Encounters:  05/28/21 128/84         Passed - Last Heart Rate in normal  range    Pulse Readings from Last 1 Encounters:  05/28/21 77         Passed - Valid encounter within last 6 months    Recent Outpatient Visits           5 months ago Hemiparesis affecting left side as late effect of cerebrovascular accident (CVA) ([place])   [place], MD   5 months ago Appointment canceled by hospital   [place], MD   5 months ago Appointment canceled by hospital   [place] Ladell Pier, MD   6 months ago Hip pain, acute, right   [place] Ladell Pier, MD   9 months ago [place], Vernia Buff, NP  Future Appointments             In 1 month [place], Dionne Bucy, PA-C [place]             atorvastatin (LIPITOR) 80 MG tablet [Pharmacy Med Name: ATORVASTATIN 80 MG TABLET] 90 tablet 0    Sig: TAKE 1 TABLET BY MOUTH EVERY DAY     Cardiovascular:  Antilipid - Statins Failed - 11/10/2021  2:06 PM      Failed - Lipid Panel in normal range within the last 12 months    Cholesterol, Total  Date Value Ref Range Status  06/07/2020 141 100 - 199 mg/dL Final   LDL Chol Calc (NIH)  Date Value Ref Range Status  06/07/2020 82 0 - 99 mg/dL Final   HDL  Date Value Ref Range Status  06/07/2020 44 >39 mg/dL Final   Triglycerides  Date Value Ref Range Status  06/07/2020 75 0 - 149 mg/dL Final         Passed - Patient is not pregnant      Passed - Valid encounter within last 12 months    Recent Outpatient Visits           5 months ago Hemiparesis affecting left side as late effect of cerebrovascular accident (CVA) ([place])   [place], Neoma Laming B, MD   5 months ago Appointment canceled by hospital   [place] Ladell Pier, MD   5  months ago Appointment canceled by hospital   [place] Ladell Pier, MD   6 months ago Hip pain, acute, right   [place] Ladell Pier, MD   9 months ago [place], Vernia Buff, NP       Future Appointments             In 1 month [place], Dionne Bucy, PA-C [place]            Signed Prescriptions Disp Refills   carvedilol (COREG) 12.5 MG tablet 180 tablet 0    Sig: TAKE 1 TABLET (12.'5MG'$  TOTAL) BY MOUTH TWICE A DAY WITH MEALS     Cardiovascular: Beta Blockers 3 Failed - 11/10/2021  2:06 PM      Failed - Cr in normal range and within 360 days    Creat  Date Value Ref Range Status  04/16/2021 1.61 (H) 0.70 - 1.30 mg/dL Final         Passed - AST in normal range and within 360 days    AST  Date Value Ref Range Status  02/19/2021 19 10 - 35 U/L Final         Passed - ALT in normal range and within 360 days    ALT  Date Value Ref Range Status  02/19/2021 25 9 - 46 U/L Final         Passed - Last BP in normal range    BP Readings from Last 1 Encounters:  05/28/21 128/84         Passed - Last Heart Rate in normal range    Pulse Readings from Last 1 Encounters:  05/28/21 77         Passed - Valid encounter within last 6 months    Recent Outpatient Visits  5 months ago Hemiparesis affecting left side as late effect of cerebrovascular accident (CVA) ([place])   [place], MD   5 months ago Appointment canceled by hospital   [place], MD   5 months ago Appointment canceled by hospital   [place], MD   6 months ago Hip pain, acute, right   [place], MD   9 months ago [place] Gildardo Pounds, NP       Future Appointments             In 1 month [place], Dionne Bucy, PA-C [place]

## 2021-11-14 ENCOUNTER — Telehealth: Payer: Self-pay | Admitting: Physical Medicine and Rehabilitation

## 2021-11-14 DIAGNOSIS — L89300 Pressure ulcer of unspecified buttock, unstageable: Secondary | ICD-10-CM | POA: Diagnosis not present

## 2021-11-14 NOTE — Telephone Encounter (Signed)
Requesting refill for oxycodone. Pharmacy will not refill at this time. Patient is having side effects ( level of urine)

## 2021-11-18 ENCOUNTER — Encounter
Payer: BC Managed Care – PPO | Attending: Physical Medicine and Rehabilitation | Admitting: Physical Medicine and Rehabilitation

## 2021-11-18 ENCOUNTER — Encounter: Payer: Self-pay | Admitting: Physical Medicine and Rehabilitation

## 2021-11-18 VITALS — BP 130/83 | HR 73

## 2021-11-18 DIAGNOSIS — I69354 Hemiplegia and hemiparesis following cerebral infarction affecting left non-dominant side: Secondary | ICD-10-CM | POA: Insufficient documentation

## 2021-11-18 DIAGNOSIS — R252 Cramp and spasm: Secondary | ICD-10-CM | POA: Diagnosis not present

## 2021-11-18 DIAGNOSIS — I69398 Other sequelae of cerebral infarction: Secondary | ICD-10-CM | POA: Insufficient documentation

## 2021-11-18 DIAGNOSIS — I6350 Cerebral infarction due to unspecified occlusion or stenosis of unspecified cerebral artery: Secondary | ICD-10-CM | POA: Insufficient documentation

## 2021-11-18 DIAGNOSIS — Z79891 Long term (current) use of opiate analgesic: Secondary | ICD-10-CM | POA: Diagnosis not present

## 2021-11-18 DIAGNOSIS — G894 Chronic pain syndrome: Secondary | ICD-10-CM | POA: Diagnosis not present

## 2021-11-18 DIAGNOSIS — Z5181 Encounter for therapeutic drug level monitoring: Secondary | ICD-10-CM | POA: Diagnosis not present

## 2021-11-18 DIAGNOSIS — N319 Neuromuscular dysfunction of bladder, unspecified: Secondary | ICD-10-CM | POA: Diagnosis not present

## 2021-11-18 MED ORDER — MIRABEGRON ER 25 MG PO TB24: 25.0000 mg | ORAL_TABLET | Freq: Every day | ORAL | 5 refills | Status: DC

## 2021-11-18 NOTE — Telephone Encounter (Signed)
Has an appt today with Lovorn.

## 2021-11-18 NOTE — Progress Notes (Signed)
Subjective:    Patient ID: [name], male    DOB: [birth year], 56 y.o.   MRN: [id number]  HPI Pt is a 56  yr old male with previous CVA with L hemiplegia- with recent new onset Afib, and worsening spasticity; sacral unstagable ulcer-; gout flare- resolved; Acute on chornic AKI/CKD 3; DM with A1c of 6.7  Here for f/u on CVA.   Needed to come back to get refill of Oxybutynin-   Is taking the Ditropan- it works-  No bladder spasms- staying dry most of the time.  A little accident every day- in depends long term.   Never sent to Urology.    Sacral decub healed as of 04/16/21!!!! So no more pain from it and no more wounds.   Not taking trazodone for sleep.  Doesn't need it1   Still having some muscle tightness/spasticity on L side.  Not painful- just annoying. And gets in way of moving LUE.   Not walking at all- doing own transfers.     Pain Inventory Average Pain 0 Pain Right Now 0 My pain is  no pain  LOCATION OF PAIN  no pain  BOWEL Number of stools per week: 18 Oral laxative use No  Type of laxative . Enema or suppository use No  History of colostomy No  Incontinent No   BLADDER Normal In and out cath, frequency . Able to self cath  . Bladder incontinence No  Frequent urination Yes  Leakage with coughing No  Difficulty starting stream No  Incomplete bladder emptying No    Mobility ability to climb steps?  no do you drive?  no  Function disabled: date disabled 12/12/2016 I need assistance with the following:  bathing, meal prep, and household duties  Neuro/Psych bladder control problems  Prior Studies Any changes since last visit?  no  Physicians involved in your care Any changes since last visit?  no   Family History  Problem Relation Age of Onset   Hypertension Mother    Heart disease Mother    Hyperlipidemia Mother    Heart failure Mother    Hypertension Sister    Hypertension Brother    Stroke Father    Hypertension Father     Colon cancer Maternal Grandmother    Diabetes Maternal Grandmother    Esophageal cancer Neg Hx    Social History   Socioeconomic History   Marital status: Married    Spouse name: [name]   Number of children: 1   Years of education: 16   Highest education level: Not on file  Occupational History   Occupation: disabled  Tobacco Use   Smoking status: Never   Smokeless tobacco: Never  Vaping Use   Vaping Use: Never used  Substance and Sexual Activity   Alcohol use: Not Currently   Drug use: No   Sexual activity: Not Currently  Other Topics Concern   Not on file  Social History Narrative   Patient drinks caffeine a couple times a week.   Patient is right handed.   Lives with wife [name]) and daughter   Social Determinants of Health   Financial Resource Strain: Not on file  Food Insecurity: Not on file  Transportation Needs: Not on file  Physical Activity: Not on file  Stress: Not on file  Social Connections: Not on file   Past Surgical History:  Procedure Laterality Date   APPENDECTOMY     BIOPSY  11/18/2020   Procedure: BIOPSY;  Surgeon: Dayna Barker  C, MD;  Location: [facility];  Service: Gastroenterology;;   COLONOSCOPY N/A 11/18/2020   Procedure: COLONOSCOPY;  Surgeon: Sharyn Creamer, MD;  Location: [facility];  Service: Gastroenterology;  Laterality: N/A;   ESOPHAGOGASTRODUODENOSCOPY (EGD) WITH PROPOFOL N/A 11/18/2020   Procedure: ESOPHAGOGASTRODUODENOSCOPY (EGD) WITH PROPOFOL;  Surgeon: Sharyn Creamer, MD;  Location: [facility];  Service: Gastroenterology;  Laterality: N/A;   JOINT REPLACEMENT Left 2004   Left hip   POLYPECTOMY  11/18/2020   Procedure: POLYPECTOMY;  Surgeon: Sharyn Creamer, MD;  Location[facility] ENDOSCOPY;  Service: Gastroenterology;;   TOTAL HIP ARTHROPLASTY     Past Medical History:  Diagnosis Date   Diabetes mellitus without complication ([place])    Gait abnormality 03/31/2018   Hemiparesis and speech and language deficit as late  effects of stroke ([place]) 03/31/2018   Hypertension    Osteomyelitis of coccyx ([place]) 02/19/2021   Rectal cancer ([place]) 2009   rectal   Sacral osteomyelitis ([place]) 02/19/2021   Seizure ([place])    Stroke ([place]) 2018   BP 130/83   Pulse 73   SpO2 96%   Opioid Risk Score:   Fall Risk Score:  `1  Depression screen [facility] 2/9     05/28/2021    9:55 AM 02/19/2021    8:58 AM 01/10/2021    2:26 PM 01/02/2021    9:53 AM 10/15/2020    9:20 AM 07/25/2020   12:18 PM 09/29/2019    2:26 PM  Depression screen PHQ 2/9  Decreased Interest 0 0 0 0 0 0 0  Down, Depressed, Hopeless 0 0 1 0 0 0 0  PHQ - 2 Score 0 0 1 0 0 0 0  Altered sleeping 0  0      Tired, decreased energy 0  0      Change in appetite 0  0      Feeling bad or failure about yourself  0  0      Trouble concentrating 0  0      Moving slowly or fidgety/restless 0  0      Suicidal thoughts 0  0      PHQ-9 Score 0  1      Difficult doing work/chores   Not difficult at all         Review of Systems    An entire ROS was completed and found to be negative except for HPI Objective:   Physical Exam  Awake, alert, delayed responses accompanied by wife; sitting in manual w/c; holding L hand in R hand; mild swelling L hand and wrist, NAD Neuro: LUE- MAS of 3-4 in L shoulder, esp very difficult/painful to do External rotation of L shoulder MAS of 2 in L elbow MAS of 2-3 in L wrist and significant curling of L fingers- nails digging into hand and has some yeast in palm.  MAS of 1+ in L hip, and knee and MAS of 1 in L ankle-   No clonus (+) hoffman's in LUE  MS:  LUE deltoid- 3-/5; Biceps 3+/5; triceps 3+/5; WE cannot isolate L wrist, or hand or FA LLE- HF 4/5; KE 4-/5; DF 3+/5 and PF 3+/5        Assessment & Plan:   Pt is a 56  yr old male with previous CVA with L hemiplegia- 11/18- stroke- with recent new onset Afib, and worsening spasticity; sacral unstagable ulcer-; gout flare- resolved; Acute on chornic AKI/CKD 3; DM with A1c of 6.7  Also has healed  pressure ulcer as of 04/16/21 and neurogenic bladder - requiring meds and spasticity.   Will try to get Insurance to cover Myrbetriq- pt gets very dry mouth and feels like desert on Oxybutynin, and so cannot increase dose, but still having bladder incontinence daily, so would benefit from stronger dose- Myrbetriq does the job with less side effects. Due to stroke related incontinence.   2.  Let me know by Thursday if cannot get Myrbetriq- then let me know, and I will send in Oxybutynin.   3. Will have pt see one of my partners for Botox of LUE- esp for L shoulder and L finger curling, making hygiene more difficult.   4. F/U in 3 months- f/u on stroke.    I spent a total of   21 minutes on total care today- >50% coordination of care- due to education on Botox- - had gotten by Dr Letta Pate- but had stopped- just didn't f/u.

## 2021-11-18 NOTE — Patient Instructions (Signed)
Pt is a 56  yr old male with previous CVA with L hemiplegia- with recent new onset Afib, and worsening spasticity; sacral unstagable ulcer-; gout flare- resolved; Acute on chornic AKI/CKD 3; DM with A1c of 6.7 Also has healed pressure ulcer as of 04/16/21 and neurogenic bladder - requiring meds and spasticity.   Will try to get Insurance to cover Myrbetriq- pt gets very dry mouth and feels like desert on Oxybutynin, and so cannot increase dose, but still having bladder incontinence daily, so would benefit from stronger dose- Myrbetriq does the job with less side effects. Due to stroke related incontinence.   2.  Let me know by Thursday if cannot get Myrbetriq- then let me know, and I will send in Oxybutynin.   3. Will have pt see one of my partners for Botox of LUE- esp for L shoulder and L finger curling, making hygiene more difficult.   4. F/U in 3 months- f/u on stroke.

## 2021-11-21 ENCOUNTER — Telehealth: Payer: Self-pay

## 2021-11-21 LAB — DRUG TOX MONITOR 1 W/CONF, ORAL FLD

## 2021-11-21 LAB — DRUG TOX ALC METAB W/CON, ORAL FLD: Alcohol Metabolite: NEGATIVE ng/mL (ref <25)

## 2021-11-21 NOTE — Telephone Encounter (Signed)
[name] called to confirm a medication was sent to pharmacy, called pharmacy and they confirmed it was ready for pick up, called [name] back  to advised mirabegron was ready

## 2021-12-11 ENCOUNTER — Encounter: Payer: Self-pay | Admitting: Physician Assistant

## 2021-12-11 ENCOUNTER — Ambulatory Visit: Payer: BC Managed Care – PPO | Attending: Physician Assistant | Admitting: Physician Assistant

## 2021-12-11 VITALS — BP 160/92 | HR 76

## 2021-12-11 DIAGNOSIS — I480 Paroxysmal atrial fibrillation: Secondary | ICD-10-CM | POA: Diagnosis not present

## 2021-12-11 DIAGNOSIS — R5381 Other malaise: Secondary | ICD-10-CM | POA: Diagnosis not present

## 2021-12-11 DIAGNOSIS — R739 Hyperglycemia, unspecified: Secondary | ICD-10-CM | POA: Diagnosis not present

## 2021-12-11 DIAGNOSIS — I6930 Unspecified sequelae of cerebral infarction: Secondary | ICD-10-CM | POA: Diagnosis not present

## 2021-12-11 DIAGNOSIS — D509 Iron deficiency anemia, unspecified: Secondary | ICD-10-CM | POA: Diagnosis not present

## 2021-12-11 DIAGNOSIS — I5189 Other ill-defined heart diseases: Secondary | ICD-10-CM

## 2021-12-11 DIAGNOSIS — K2210 Ulcer of esophagus without bleeding: Secondary | ICD-10-CM

## 2021-12-11 MED ORDER — DILTIAZEM HCL 30 MG PO TABS: ORAL_TABLET | ORAL | 1 refills | Status: DC

## 2021-12-11 MED ORDER — CARVEDILOL 12.5 MG PO TABS: ORAL_TABLET | ORAL | 1 refills | Status: DC

## 2021-12-11 MED ORDER — ATORVASTATIN CALCIUM 80 MG PO TABS: 80.0000 mg | ORAL_TABLET | Freq: Every day | ORAL | 1 refills | Status: DC

## 2021-12-11 MED ORDER — AMIODARONE HCL 200 MG PO TABS: 200.0000 mg | ORAL_TABLET | Freq: Every day | ORAL | 1 refills | Status: DC

## 2021-12-11 MED ORDER — FERROUS SULFATE 325 (65 FE) MG PO TABS: ORAL_TABLET | ORAL | 1 refills | Status: DC

## 2021-12-11 MED ORDER — PANTOPRAZOLE SODIUM 40 MG PO TBEC: 40.0000 mg | DELAYED_RELEASE_TABLET | Freq: Every day | ORAL | 1 refills | Status: DC

## 2021-12-11 NOTE — Progress Notes (Signed)
Patient ID: [name], male   DOB: Sep 11, 1965, 56 y.o.   MRN: [id number]   [name], is a 56 y.o. male  GYK:[id number]  [id number]:[id number]  DOB - 1965-12-01  Chief Complaint  Patient presents with   Hypertension       Subjective:   [name] is a 56 y.o. male here today for med RF.  He is s/p R pontine CVA and h/o PAF (diagnosed 11/2020)that was lost to cardiology in followup.  He is followed by physical medicine rehab.  He was supposed to see Dr Doylene Canard for cardiology followup; however, the day they went for his appt, it was cancelled and they never rescheduled it.  Denies CP/SOB.  Using electric wheelchair.  Wife is here with him  BP OOO usu 130/70-80  Says he is no longer feeling depressed and does not need RF citalopram No problems updated.  ALLERGIES: No Known Allergies  PAST MEDICAL HISTORY: Past Medical History:  Diagnosis Date   Diabetes mellitus without complication ([place])    Gait abnormality 03/31/2018   Hemiparesis and speech and language deficit as late effects of stroke ([place]) 03/31/2018   Hypertension    Osteomyelitis of coccyx ([place]) 02/19/2021   Rectal cancer ([place]) 2009   rectal   Sacral osteomyelitis ([place]) 02/19/2021   Seizure ([place])    Stroke ([place]) 2018    MEDICATIONS AT HOME: Prior to Admission medications   Medication Sig Start Date End Date Taking? Authorizing Provider  meclizine (ANTIVERT) 12.5 MG tablet TAKE 1-2 TABLETS (12.5-25 MG TOTAL) BY MOUTH 3 (THREE) TIMES DAILY AS NEEDED FOR DIZZINESS. 05/08/21  Yes Ladell Pier, MD  mirabegron ER (MYRBETRIQ) 25 MG TB24 tablet Take 1 tablet (25 mg total) by mouth daily. Can increase to 50 mg daily in 2 weeks- for urinary incontinence due to Stroke 11/18/21  Yes Lovorn, Jinny Blossom, MD  amiodarone (PACERONE) 200 MG tablet Take 1 tablet (200 mg total) by mouth daily. 12/11/21   Argentina Donovan, PA-C  atorvastatin (LIPITOR) 80 MG tablet Take 1 tablet (80 mg total) by mouth daily. 12/11/21   Argentina Donovan, PA-C  carvedilol (COREG) 12.5 MG tablet TAKE 1 TABLET (12.'5MG'$  TOTAL) BY MOUTH TWICE A DAY WITH MEALS 12/11/21   Freeman Caldron M, PA-C  diltiazem (CARDIZEM) 30 MG tablet TAKE 1 TABLET bid 12/11/21   Argentina Donovan, PA-C  ferrous sulfate 325 (65 FE) MG tablet 1 tab PO Q Mon/Wed/Fri 12/11/21   Argentina Donovan, PA-C  pantoprazole (PROTONIX) 40 MG tablet Take 1 tablet (40 mg total) by mouth daily. 12/11/21   Miklos Bidinger, Dionne Bucy, PA-C    ROS: Neg HEENT Neg resp Neg GI Neg MS Neg psych  Objective:   Vitals:   12/11/21 1420  BP: (!) 160/92  Pulse: 76  SpO2: 99%   Exam General appearance : Awake, alert, not in any distress. Speech Clear. Not toxic looking.  In wheelchair.  Responsive and communicative HEENT: Atraumatic and Normocephalic  Neck: Supple, no JVD. No cervical lymphadenopathy.  Chest: Good air entry bilaterally, CTAB.  No rales/rhonchi/wheezing CVS: S1 S2 regular, no murmurs.  Extremities: B/L Lower Ext shows minimal edema, both legs are warm to touch Neurology: Awake alert, and oriented X 3, CN II-XII intact, Non focal Skin: No Rash  Data Review Lab Results  Component Value Date   HGBA1C 6.7 (H) 11/14/2020   HGBA1C 6.2 10/15/2020   HGBA1C 7.2 (H) 06/07/2020    Assessment & Plan   1. PAF (paroxysmal atrial  fibrillation) (HCC) - amiodarone (PACERONE) 200 MG tablet; Take 1 tablet (200 mg total) by mouth daily.  Dispense: 90 tablet; Refill: 1 - Ambulatory referral to Cardiology - carvedilol (COREG) 12.5 MG tablet; TAKE 1 TABLET (12.'5MG'$  TOTAL) BY MOUTH TWICE A DAY WITH MEALS  Dispense: 180 tablet; Refill: 1 - diltiazem (CARDIZEM) 30 MG tablet; TAKE 1 TABLET bid  Dispense: 180 tablet; Refill: 1 - Comprehensive metabolic panel  2. History of CVA with residual deficit - atorvastatin (LIPITOR) 80 MG tablet; Take 1 tablet (80 mg total) by mouth daily.  Dispense: 90 tablet; Refill: 1 - Comprehensive metabolic panel  3. Physical deconditioning -  Comprehensive metabolic panel  4. Microcytic anemia - CBC with Differential/Platelet - Comprehensive metabolic panel - ferrous sulfate 325 (65 FE) MG tablet; 1 tab PO Q Mon/Wed/Fri  Dispense: 100 tablet; Refill: 1  5. Diastolic dysfunction - Ambulatory referral to Cardiology - carvedilol (COREG) 12.5 MG tablet; TAKE 1 TABLET (12.'5MG'$  TOTAL) BY MOUTH TWICE A DAY WITH MEALS  Dispense: 180 tablet; Refill: 1 - diltiazem (CARDIZEM) 30 MG tablet; TAKE 1 TABLET bid  Dispense: 180 tablet; Refill: 1  6. Ulcer of esophagus without bleeding - pantoprazole (PROTONIX) 40 MG tablet; Take 1 tablet (40 mg total) by mouth daily.  Dispense: 90 tablet; Refill: 1  7. Hyperglycemia - Hemoglobin A1c    Return in about 4 months (around 04/11/2022) for PCP (Dr Wynetta Emery) for chronic conditions.  The patient was given clear instructions to go to ER or return to medical center if symptoms don't improve, worsen or new problems develop. The patient verbalized understanding. The patient was told to call to get lab results if they haven't heard anything in the next week.      Freeman Caldron, PA-C [facility] and [place], [place]   12/11/2021, 2:38 PM

## 2021-12-12 ENCOUNTER — Other Ambulatory Visit: Payer: Self-pay | Admitting: Physician Assistant

## 2021-12-12 DIAGNOSIS — E1159 Type 2 diabetes mellitus with other circulatory complications: Secondary | ICD-10-CM

## 2021-12-12 LAB — COMPREHENSIVE METABOLIC PANEL
ALT: 15 IU/L (ref 0–44)
AST: 14 IU/L (ref 0–40)
Albumin/Globulin Ratio: 1.6 (ref 1.2–2.2)
Albumin: 4.4 g/dL (ref 3.8–4.9)
Alkaline Phosphatase: 97 IU/L (ref 44–121)
BUN/Creatinine Ratio: 11 (ref 9–20)
BUN: 20 mg/dL (ref 6–24)
Bilirubin Total: 0.4 mg/dL (ref 0.0–1.2)
CO2: 23 mmol/L (ref 20–29)
Calcium: 9.1 mg/dL (ref 8.7–10.2)
Chloride: 108 mmol/L — ABNORMAL HIGH (ref 96–106)
Creatinine, Ser: 1.76 mg/dL — ABNORMAL HIGH (ref 0.76–1.27)
Globulin, Total: 2.7 g/dL (ref 1.5–4.5)
Glucose: 131 mg/dL — ABNORMAL HIGH (ref 70–99)
Potassium: 4.5 mmol/L (ref 3.5–5.2)
Sodium: 146 mmol/L — ABNORMAL HIGH (ref 134–144)
Total Protein: 7.1 g/dL (ref 6.0–8.5)
eGFR: 45 mL/min/{1.73_m2} — ABNORMAL LOW (ref >59)

## 2021-12-12 LAB — CBC WITH DIFFERENTIAL/PLATELET
Basophils Absolute: 0 10*3/uL (ref 0.0–0.2)
Basos: 0 %
EOS (ABSOLUTE): 0.1 10*3/uL (ref 0.0–0.4)
Eos: 2 %
Hematocrit: 37.1 % — ABNORMAL LOW (ref 37.5–51.0)
Hemoglobin: 12.2 g/dL — ABNORMAL LOW (ref 13.0–17.7)
Immature Grans (Abs): 0 10*3/uL (ref 0.0–0.1)
Immature Granulocytes: 1 %
Lymphocytes Absolute: 1.2 10*3/uL (ref 0.7–3.1)
Lymphs: 29 %
MCH: 27.1 pg (ref 26.6–33.0)
MCHC: 32.9 g/dL (ref 31.5–35.7)
MCV: 82 fL (ref 79–97)
Monocytes Absolute: 0.3 10*3/uL (ref 0.1–0.9)
Monocytes: 8 %
Neutrophils Absolute: 2.4 10*3/uL (ref 1.4–7.0)
Neutrophils: 60 %
Platelets: 220 10*3/uL (ref 150–450)
RBC: 4.51 x10E6/uL (ref 4.14–5.80)
RDW: 14.4 % (ref 11.6–15.4)
WBC: 4 10*3/uL (ref 3.4–10.8)

## 2021-12-12 LAB — HEMOGLOBIN A1C
Est. average glucose Bld gHb Est-mCnc: 137 mg/dL
Hgb A1c MFr Bld: 6.4 % — ABNORMAL HIGH (ref 4.8–5.6)

## 2021-12-12 MED ORDER — GLIPIZIDE 5 MG PO TABS: 5.0000 mg | ORAL_TABLET | Freq: Every day | ORAL | 1 refills | Status: DC

## 2021-12-13 ENCOUNTER — Telehealth: Payer: Self-pay | Admitting: *Deleted

## 2021-12-13 DIAGNOSIS — I6930 Unspecified sequelae of cerebral infarction: Secondary | ICD-10-CM

## 2021-12-13 NOTE — Telephone Encounter (Signed)
States [place] has been in touch with him regarding his mattress and wanting to pick it up and replace with a regular mattress. States he does not have a regular mattress but also can not afford the mattress [employer] has.

## 2021-12-14 DIAGNOSIS — L89300 Pressure ulcer of unspecified buttock, unstageable: Secondary | ICD-10-CM | POA: Diagnosis not present

## 2021-12-14 NOTE — Progress Notes (Unsigned)
Cardiology Office Note:    Date:  12/16/2021   ID:  [name], DOB 11/07/1965, MRN [id number]  PCP:  Ladell Pier, MD   [facility] Providers Cardiologist:  Lenna Sciara, MD Referring MD: Ladell Pier, MD   Chief Complaint/Reason for Referral: Establish cardiovascular care  ASSESSMENT:    1. Type 2 diabetes mellitus with vascular disease ([place])   2. Hypertension associated with diabetes ([place])   3. Hyperlipidemia associated with type 2 diabetes mellitus ([place]); goal LDL < 55   4. Stage 3b chronic kidney disease ([place])   5. Aortic atherosclerosis ([facility])   6. Paroxysmal atrial fibrillation ([place])   7. Diastolic dysfunction   8. Acute ischemic stroke ([place])     PLAN:    In order of problems listed above: 1.  Type 2 diabetes: Start Eliquis 5 mg twice daily in lieu of aspirin as detailed below, continue atorvastatin 80 mg, start losartan 12.5 mg daily for renal protection.  Start Jardiance 10 mg daily. 2.  Hypertension: Start losartan as detailed above. 3.  Hyperlipidemia: Check lipid panel, CMP, LP(a) next week.  Goal LDL given history of stroke is less than 55. 4.  Stage III chronic kidney disease: Start Jardiance and losartan as above for renal protection.  Check CMP next week. 5.  Aortic atherosclerosis: Continue statin, start Eliquis in lieu of aspirin as detailed below, and strict blood pressure control. 6.  Paroxysmal atrial fibrillation: CV 2 score is elevated at 5.  We will start Eliquis 5 mg twice daily.  Hemoglobins have been relatively stable.  We will stop amiodarone and continue diltiazem and Coreg. 7.  Diastolic dysfunction: We will start Jardiance and losartan as detailed above. 8.  History of stroke: Due to small vessel disease per brain MRI in 2018.            Dispo:  Return in about 6 months (around 06/17/2022).      Medication Adjustments/Labs and Tests Ordered: Current medicines are reviewed at length with the patient today.  Concerns  regarding medicines are outlined above.  The following changes have been made:     Labs/tests ordered: Orders Placed This Encounter  Procedures   Comprehensive metabolic panel   Lipoprotein A (LPA)   Lipid panel   EKG 12-Lead    Medication Changes: Meds ordered this encounter  Medications   empagliflozin (JARDIANCE) 10 MG TABS tablet    Sig: Take 1 tablet (10 mg total) by mouth daily before breakfast.    Dispense:  90 tablet    Refill:  3   losartan (COZAAR) 25 MG tablet    Sig: Take 1 tablet (25 mg total) by mouth daily.    Dispense:  45 tablet    Refill:  3   apixaban (ELIQUIS) 5 MG TABS tablet    Sig: Take 1 tablet (5 mg total) by mouth 2 (two) times daily.    Dispense:  180 tablet    Refill:  3     Current medicines are reviewed at length with the patient today.  The patient does not have concerns regarding medicines.   History of Present Illness:    FOCUSED PROBLEM LIST:   1.  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  Type 2 diabetes not on insulin 3.  Hyperlipidemia 4.  Hypertension 5.  Chronic kidney disease stage III 6.  Rectal cancer status post resection, radiation, and chemotherapy 7.  Paroxysmal  atrial fibrillation with CV 2 score 5 8.  Aortic atherosclerosis seen on CT chest 2022  The patient is a 56 y.o. male with the indicated medical history here to establish cardiovascular care.  The patient had been admitted to the hospital in November 2022.  He had sustained a fall and was found to be in atrial fibrillation.  Plan was developed to start Tamora after the patient had undergone an outpatient EGD.  He had underwent an EGD in the hospital with biopsies due to progressive anemia with iron deficiency.  These biopsies were negative.  It has been relatively stable over the last few months.  He was seen by his primary care provider recently and it was noted that he was lost to  cardiology follow-up and he was therefore referred to establish cardiovascular care.  The patient has been doing well.  He has had no signs or symptoms of bleeding.  He denies any palpitations, paroxysmal atrial dyspnea, orthopnea.  He has had no exertional angina.  He has not required any emergency room visits or hospitalizations.  He did get COVID booster but has not yet been vaccinated for influenza.  He is otherwise well without significant complaints.          Current Medications: Current Meds  Medication Sig   apixaban (ELIQUIS) 5 MG TABS tablet Take 1 tablet (5 mg total) by mouth 2 (two) times daily.   atorvastatin (LIPITOR) 80 MG tablet Take 1 tablet (80 mg total) by mouth daily.   carvedilol (COREG) 12.5 MG tablet TAKE 1 TABLET (12.'5MG'$  TOTAL) BY MOUTH TWICE A DAY WITH MEALS   diltiazem (CARDIZEM) 30 MG tablet TAKE 1 TABLET bid   empagliflozin (JARDIANCE) 10 MG TABS tablet Take 1 tablet (10 mg total) by mouth daily before breakfast.   ferrous sulfate 325 (65 FE) MG tablet 1 tab PO Q Mon/Wed/Fri   glipiZIDE (GLUCOTROL) 5 MG tablet Take 1 tablet (5 mg total) by mouth daily before breakfast.   losartan (COZAAR) 25 MG tablet Take 1 tablet (25 mg total) by mouth daily.   meclizine (ANTIVERT) 12.5 MG tablet TAKE 1-2 TABLETS (12.5-25 MG TOTAL) BY MOUTH 3 (THREE) TIMES DAILY AS NEEDED FOR DIZZINESS.   mirabegron ER (MYRBETRIQ) 25 MG TB24 tablet Take 1 tablet (25 mg total) by mouth daily. Can increase to 50 mg daily in 2 weeks- for urinary incontinence due to Stroke   pantoprazole (PROTONIX) 40 MG tablet Take 1 tablet (40 mg total) by mouth daily.   [DISCONTINUED] amiodarone (PACERONE) 200 MG tablet Take 1 tablet (200 mg total) by mouth daily.     Allergies:    Patient has no known allergies.   Social History:   Social History   Tobacco Use   Smoking status: Never   Smokeless tobacco: Never  Vaping Use   Vaping Use: Never used  Substance Use Topics   Alcohol use: Not Currently    Drug use: No     Family Hx: Family History  Problem Relation Age of Onset   Hypertension Mother    Heart disease Mother    Hyperlipidemia Mother    Heart failure Mother    Hypertension Sister    Hypertension Brother    Stroke Father    Hypertension Father    Colon cancer Maternal Grandmother    Diabetes Maternal Grandmother    Esophageal cancer Neg Hx      Review of Systems:   Please see the history of present illness.    All  other systems reviewed and are negative.     EKGs/Labs/Other Test Reviewed:    EKG:  EKG performed November 2022 that I personally reviewed demonstrates atrial fibrillation with rapid ventricular rate; EKG performed today that I personally reviewed demonstrates sinus rhythm with nonspecific ST and T wave changes..  Prior CV studies:  TTE 2022:  1. Left ventricular ejection fraction, by estimation, is 60 to 65%. The  left ventricle has normal function. The left ventricle has no regional  wall motion abnormalities. There is mild concentric left ventricular  hypertrophy. Left ventricular diastolic  parameters are consistent with Grade I diastolic dysfunction (impaired  relaxation).   2. Right ventricular systolic function is normal. The right ventricular  size is normal.   3. Left atrial size was mildly dilated.   4. Right atrial size was mildly dilated.   5. The mitral valve is normal in structure. Trivial mitral valve  regurgitation.   6. The aortic valve is tricuspid. Aortic valve regurgitation is not  visualized.   7. The inferior vena cava is normal in size with greater than 50%  respiratory variability, suggesting right atrial pressure of 3 mmHg.   Other studies Reviewed: Review of the additional studies/records demonstrates: Chest CT 2022 with aortic atherosclerosis  Recent Labs: 12/11/2021: ALT 15; BUN 20; Creatinine, Ser 1.76; Hemoglobin 12.2; Platelets 220; Potassium 4.5; Sodium 146   Recent Lipid Panel Lab Results  Component Value  Date/Time   CHOL 141 06/07/2020 10:19 AM   TRIG 75 06/07/2020 10:19 AM   HDL 44 06/07/2020 10:19 AM   LDLCALC 82 06/07/2020 10:19 AM    Risk Assessment/Calculations:     CHA2DS2-VASc Score = 5   This indicates a 7.2% annual risk of stroke. The patient's score is based upon: CHF History: 1 HTN History: 1 Diabetes History: 1 Stroke History: 2 Vascular Disease History: 0 Age Score: 0 Gender Score: 0              Physical Exam:    VS:  BP 135/80   Pulse 79   Ht '5\' 4"'$  (1.626 m)   Wt 215 lb (97.5 kg) Comment: Per pt.  in wheelchair  SpO2 97%   BMI 36.90 kg/m    Wt Readings from Last 3 Encounters:  12/16/21 215 lb (97.5 kg)  05/28/21 179 lb (81.2 kg)  01/02/21 249 lb 1.9 oz (113 kg)    GENERAL:  No apparent distress, AOx3 HEENT:  No carotid bruits, +2 carotid impulses, no scleral icterus CAR: RRR no murmurs, gallops, rubs, or thrills RES:  Clear to auscultation bilaterally ABD:  Soft, nontender, nondistended, positive bowel sounds x 4 VASC:  +2 radial pulses, +2 carotid pulses, palpable pedal pulses NEURO:  CN 2-12 grossly intact; left upper extremity weakness PSYCH:  No active depression or anxiety EXT:  No edema, ecchymosis, or cyanosis  Signed, Early Osmond, MD  12/16/2021 9:23 AM    [address], [place], [place]  [address] Phone: [phone]; Fax: [phone]   Note:  This document was prepared using Dragon voice recognition software and may include unintentional dictation errors.

## 2021-12-16 ENCOUNTER — Ambulatory Visit: Payer: BC Managed Care – PPO | Attending: Internal Medicine | Admitting: Internal Medicine

## 2021-12-16 ENCOUNTER — Encounter: Payer: Self-pay | Admitting: Internal Medicine

## 2021-12-16 VITALS — BP 135/80 | HR 79 | Ht 64.0 in | Wt 215.0 lb

## 2021-12-16 DIAGNOSIS — I639 Cerebral infarction, unspecified: Secondary | ICD-10-CM

## 2021-12-16 DIAGNOSIS — E1159 Type 2 diabetes mellitus with other circulatory complications: Secondary | ICD-10-CM

## 2021-12-16 DIAGNOSIS — I5189 Other ill-defined heart diseases: Secondary | ICD-10-CM

## 2021-12-16 DIAGNOSIS — N1832 Chronic kidney disease, stage 3b: Secondary | ICD-10-CM | POA: Diagnosis not present

## 2021-12-16 DIAGNOSIS — E1169 Type 2 diabetes mellitus with other specified complication: Secondary | ICD-10-CM

## 2021-12-16 DIAGNOSIS — I700 Atherosclerosis of aorta: Secondary | ICD-10-CM | POA: Diagnosis not present

## 2021-12-16 DIAGNOSIS — E785 Hyperlipidemia, unspecified: Secondary | ICD-10-CM

## 2021-12-16 DIAGNOSIS — I480 Paroxysmal atrial fibrillation: Secondary | ICD-10-CM

## 2021-12-16 DIAGNOSIS — I152 Hypertension secondary to endocrine disorders: Secondary | ICD-10-CM

## 2021-12-16 MED ORDER — EMPAGLIFLOZIN 10 MG PO TABS: 10.0000 mg | ORAL_TABLET | Freq: Every day | ORAL | 3 refills | Status: DC

## 2021-12-16 MED ORDER — LOSARTAN POTASSIUM 25 MG PO TABS: 25.0000 mg | ORAL_TABLET | Freq: Every day | ORAL | 3 refills | Status: DC

## 2021-12-16 MED ORDER — LOSARTAN POTASSIUM 25 MG PO TABS: 12.5000 mg | ORAL_TABLET | Freq: Every day | ORAL | 3 refills | Status: DC

## 2021-12-16 MED ORDER — APIXABAN 5 MG PO TABS: 5.0000 mg | ORAL_TABLET | Freq: Two times a day (BID) | ORAL | 3 refills | Status: DC

## 2021-12-16 NOTE — Addendum Note (Signed)
Addended by: Vergia Alcon A on: 12/16/2021 09:27 AM   Modules accepted: Orders

## 2021-12-16 NOTE — Patient Instructions (Signed)
Medication Instructions:  Your physician has recommended you make the following change in your medication:   1.Stop amiodarone 2 start Eliquis '5mg'$  2 times daily  3. Start Losartan 12.'5mg'$  at bedtime 4. Start Jardiance '10mg'$  daily  *If you need a refill on your cardiac medications before your next appointment, please call your pharmacy*   Lab Work: Lipids, Lpa, CMP in 1 week If you have labs (blood work) drawn today and your tests are completely normal, you will receive your results only by: [place] (if you have MyChart) OR A paper copy in the mail If you have any lab test that is abnormal or we need to change your treatment, we will call you to review the results.   Follow-Up: At [facility], you and your health needs are our priority.  As part of our continuing mission to provide you with exceptional heart care, we have created designated Provider Care Teams.  These Care Teams include your primary Cardiologist (physician) and Advanced Practice Providers (APPs -  Physician Assistants and Nurse Practitioners) who all work together to provide you with the care you need, when you need it.  Your next appointment:   6 month(s)  The format for your next appointment:   In Person  Provider:   APP  Important Information About Sugar

## 2021-12-17 NOTE — Telephone Encounter (Signed)
[name] is needing the face to face  OV notes documenting the continuous use of the bed and mattress for the pt in order to process his order  Cb  [phone]

## 2021-12-17 NOTE — Telephone Encounter (Signed)
Routing to PCP for review.

## 2021-12-24 ENCOUNTER — Ambulatory Visit: Payer: BC Managed Care – PPO | Attending: Internal Medicine

## 2021-12-24 DIAGNOSIS — E1169 Type 2 diabetes mellitus with other specified complication: Secondary | ICD-10-CM | POA: Diagnosis not present

## 2021-12-24 DIAGNOSIS — I152 Hypertension secondary to endocrine disorders: Secondary | ICD-10-CM | POA: Diagnosis not present

## 2021-12-24 DIAGNOSIS — E785 Hyperlipidemia, unspecified: Secondary | ICD-10-CM | POA: Diagnosis not present

## 2021-12-24 DIAGNOSIS — E1159 Type 2 diabetes mellitus with other circulatory complications: Secondary | ICD-10-CM | POA: Diagnosis not present

## 2021-12-25 LAB — LIPID PANEL
Chol/HDL Ratio: 3.6 ratio (ref 0.0–5.0)
Cholesterol, Total: 177 mg/dL (ref 100–199)
HDL: 49 mg/dL (ref >39)
LDL Chol Calc (NIH): 101 mg/dL — ABNORMAL HIGH (ref 0–99)
Triglycerides: 153 mg/dL — ABNORMAL HIGH (ref 0–149)
VLDL Cholesterol Cal: 27 mg/dL (ref 5–40)

## 2021-12-25 LAB — COMPREHENSIVE METABOLIC PANEL
ALT: 24 IU/L (ref 0–44)
AST: 16 IU/L (ref 0–40)
Albumin/Globulin Ratio: 1.6 (ref 1.2–2.2)
Albumin: 4.3 g/dL (ref 3.8–4.9)
Alkaline Phosphatase: 99 IU/L (ref 44–121)
BUN/Creatinine Ratio: 15 (ref 9–20)
BUN: 31 mg/dL — ABNORMAL HIGH (ref 6–24)
Bilirubin Total: 0.3 mg/dL (ref 0.0–1.2)
CO2: 23 mmol/L (ref 20–29)
Calcium: 9.4 mg/dL (ref 8.7–10.2)
Chloride: 108 mmol/L — ABNORMAL HIGH (ref 96–106)
Creatinine, Ser: 2.12 mg/dL — ABNORMAL HIGH (ref 0.76–1.27)
Globulin, Total: 2.7 g/dL (ref 1.5–4.5)
Glucose: 131 mg/dL — ABNORMAL HIGH (ref 70–99)
Potassium: 4.1 mmol/L (ref 3.5–5.2)
Sodium: 145 mmol/L — ABNORMAL HIGH (ref 134–144)
Total Protein: 7 g/dL (ref 6.0–8.5)
eGFR: 36 mL/min/{1.73_m2} — ABNORMAL LOW (ref >59)

## 2021-12-25 LAB — LIPOPROTEIN A (LPA): Lipoprotein (a): 222.3 nmol/L — ABNORMAL HIGH (ref <75.0)

## 2021-12-25 NOTE — Telephone Encounter (Signed)
I called [facility] who confirmed that the patient has a low air loss mattress and they need to re-certify him. A face to face encounter with provider is needed documenting change in pressure ulcers, including sizes indicating the benefits of the mattress and the need to continue to use it.   I then called the patient to schedule an appointment with Dr Wynetta Emery and he said he doesn't have any pressure ulcers, no open wounds and he does not need the air mattress any longer. He just wants to make sure that when the air mattress is picked up, a regular hospital bed mattress will be delivered.   I spoke to [facility] again and explained patient's concern.  She said they will switch out the mattresses.  Dr Wynetta Emery needs to send an order to discontinue the low air loss mattress and replace it with a standard hospital bed mattress

## 2021-12-26 NOTE — Addendum Note (Signed)
Addended by: Karle Plumber B on: 12/26/2021 10:49 PM   Modules accepted: Orders

## 2021-12-31 ENCOUNTER — Telehealth: Payer: Self-pay | Admitting: *Deleted

## 2021-12-31 DIAGNOSIS — I152 Hypertension secondary to endocrine disorders: Secondary | ICD-10-CM

## 2021-12-31 DIAGNOSIS — E1169 Type 2 diabetes mellitus with other specified complication: Secondary | ICD-10-CM

## 2021-12-31 NOTE — Telephone Encounter (Signed)
-----   Message from Early Osmond, MD sent at 12/25/2021  8:17 AM EST ----- Please refer to pharmacy as patient is on max dose atorva and LDL above goal.  Also Lp(a) elevated.  I would like to check another BMP in 1 week to make sure it is stable

## 2021-12-31 NOTE — Telephone Encounter (Signed)
Spoke w the patient and reviewed the results.  He will continue on losartan 12.5 mg for now.  Recheck bmp on Thursday this week.  Appointment scheduled.   Referral placed to PharmD for elevated lipids on atorvastatin 80 mg daily.  He is aware he will be called to schedule.

## 2021-12-31 NOTE — Telephone Encounter (Signed)
Order for d/c air mattress faxed to [facility]

## 2022-01-02 ENCOUNTER — Ambulatory Visit: Payer: BC Managed Care – PPO | Attending: Internal Medicine

## 2022-01-02 DIAGNOSIS — E1159 Type 2 diabetes mellitus with other circulatory complications: Secondary | ICD-10-CM | POA: Diagnosis not present

## 2022-01-02 DIAGNOSIS — I152 Hypertension secondary to endocrine disorders: Secondary | ICD-10-CM | POA: Diagnosis not present

## 2022-01-02 DIAGNOSIS — E785 Hyperlipidemia, unspecified: Secondary | ICD-10-CM | POA: Diagnosis not present

## 2022-01-02 DIAGNOSIS — E1169 Type 2 diabetes mellitus with other specified complication: Secondary | ICD-10-CM

## 2022-01-03 LAB — BASIC METABOLIC PANEL
BUN/Creatinine Ratio: 10 (ref 9–20)
BUN: 20 mg/dL (ref 6–24)
CO2: 22 mmol/L (ref 20–29)
Calcium: 9.5 mg/dL (ref 8.7–10.2)
Chloride: 107 mmol/L — ABNORMAL HIGH (ref 96–106)
Creatinine, Ser: 1.95 mg/dL — ABNORMAL HIGH (ref 0.76–1.27)
Glucose: 78 mg/dL (ref 70–99)
Potassium: 4.7 mmol/L (ref 3.5–5.2)
Sodium: 143 mmol/L (ref 134–144)
eGFR: 40 mL/min/{1.73_m2} — ABNORMAL LOW (ref >59)

## 2022-01-20 ENCOUNTER — Other Ambulatory Visit: Payer: Self-pay | Admitting: Physical Medicine and Rehabilitation

## 2022-01-20 ENCOUNTER — Other Ambulatory Visit: Payer: Self-pay | Admitting: Infectious Disease

## 2022-01-22 ENCOUNTER — Other Ambulatory Visit: Payer: Self-pay | Admitting: Physical Medicine and Rehabilitation

## 2022-02-11 NOTE — Progress Notes (Signed)
Patient ID: [name]                 DOB: November 11, 1965                      MRN: [id number]      HPI: [name] is a 57 y.o. male referred by Dr. Ali Lowe to HTN clinic. PMH is significant for hypertension,T2DM,Ischemic stroke,Afib,  Current HTN meds:  Previously tried:  BP goal:   Family History:   Social History:   Diet:   Exercise:  {types:28256}  Home BP readings:  Date SBP/DBP  HR              Average      Wt Readings from Last 3 Encounters:  12/16/21 215 lb (97.5 kg)  05/28/21 179 lb (81.2 kg)  01/02/21 249 lb 1.9 oz (113 kg)   BP Readings from Last 3 Encounters:  12/16/21 135/80  12/11/21 (!) 160/92  11/18/21 130/83   Pulse Readings from Last 3 Encounters:  12/16/21 79  12/11/21 76  11/18/21 73    Renal function: CrCl cannot be calculated (Patient's most recent lab result is older than the maximum 21 days allowed.).  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 180 tablet 3   atorvastatin (LIPITOR) 80 MG tablet Take 1 tablet (80 mg total) by mouth daily. 90 tablet 1   carvedilol (COREG) 12.5 MG tablet TAKE 1 TABLET (12.'5MG'$  TOTAL) BY MOUTH TWICE A DAY WITH MEALS 180 tablet 1   diltiazem (CARDIZEM) 30 MG tablet TAKE 1 TABLET bid 180 tablet 1   empagliflozin (JARDIANCE) 10 MG TABS tablet Take 1 tablet (10 mg total) by mouth daily before breakfast. 90 tablet 3   ferrous sulfate 325 (65 FE) MG tablet 1 tab PO Q Mon/Wed/Fri 100  tablet 1   glipiZIDE (GLUCOTROL) 5 MG tablet Take 1 tablet (5 mg total) by mouth daily before breakfast. 90 tablet 1   losartan (COZAAR) 25 MG tablet Take 0.5 tablets (12.5 mg total) by mouth daily. 45 tablet 3   meclizine (ANTIVERT) 12.5 MG tablet TAKE 1-2 TABLETS (12.5-25 MG TOTAL) BY MOUTH 3 (THREE) TIMES DAILY AS NEEDED FOR DIZZINESS. 60 tablet 1   pantoprazole (PROTONIX) 40 MG tablet Take 1 tablet (40 mg total) by mouth daily. 90 tablet 1   Vibegron (GEMTESA) 75 MG TABS Take 75 mg by mouth daily. Insurance won't cover Mybetriq- so will prescribe Gemtesa- take with 1 8 oz cup of water daily 30 tablet 5   No current facility-administered medications on file prior to visit.    No Known Allergies  There were no vitals taken for this visit.   Assessment/Plan:  1. Hypertension -  No problem-specific Assessment & Plan notes found for this encounter.      Thank you  Cammy Copa, Pharm.D [facility] [phone]  [address], [place], [place] [address]  Phone: [phone]; Fax: [phone]

## 2022-02-12 ENCOUNTER — Ambulatory Visit: Payer: BC Managed Care – PPO | Attending: Student | Admitting: Student

## 2022-02-12 ENCOUNTER — Telehealth: Payer: Self-pay | Admitting: Pharmacist

## 2022-02-12 VITALS — BP 128/70

## 2022-02-12 DIAGNOSIS — I10 Essential (primary) hypertension: Secondary | ICD-10-CM | POA: Diagnosis not present

## 2022-02-12 DIAGNOSIS — E7849 Other hyperlipidemia: Secondary | ICD-10-CM

## 2022-02-12 DIAGNOSIS — E1169 Type 2 diabetes mellitus with other specified complication: Secondary | ICD-10-CM

## 2022-02-12 DIAGNOSIS — E785 Hyperlipidemia, unspecified: Secondary | ICD-10-CM | POA: Insufficient documentation

## 2022-02-12 MED ORDER — REPATHA SURECLICK 140 MG/ML ~~LOC~~ SOAJ: 140.0000 mg | SUBCUTANEOUS | 3 refills | Status: DC

## 2022-02-12 NOTE — Patient Instructions (Addendum)
Your Results:             Your most recent labs Goal  Total Cholesterol 177 < 200  Triglycerides 153 < 150  HDL (happy/good cholesterol) 49 > 40  LDL (lousy/bad cholesterol) 101 < 55   Medication changes: We will start the process to get PCSK9i (Repatha or Praluent) covered by your insurance.  Once the prior authorization is complete, we will call you to let you know and confirm pharmacy information.    Praluent is a cholesterol medication that improved your body's ability to get rid of "bad cholesterol" known as LDL. It can lower your LDL up to 60%. It is an injection that is given under the skin every 2 weeks. The most common side effects of Praluent include runny nose, symptoms of the common cold, rarely flu or flu-like symptoms, back/muscle pain in about 3-4% of the patients, and redness, pain, or bruising at the injection site.   Repatha is a cholesterol medication that improved your body's ability to get rid of "bad cholesterol" known as LDL. It can lower your LDL up to 60%! It is an injection that is given under the skin every 2 weeks. The most common side effects of Repatha include runny nose, symptoms of the common cold, rarely flu or flu-like symptoms, back/muscle pain in about 3-4% of the patients, and redness, pain, or bruising at the injection site.   Lab orders: We want to repeat labs after 2-3 months.  We will send you a lab order to remind you once we get closer to that time.   No change to the BP medications, continue taking  carvedilol 12.5 mg twice daily, diltiazem 30 mg twice daily, losartan 12.5 mg daily      HOW TO TAKE YOUR BLOOD PRESSURE AT HOME  Rest 5 minutes before taking your blood pressure.  Don't smoke or drink caffeinated beverages for at least 30 minutes before. Take your blood pressure before (not after) you eat. Sit comfortably with your back supported and both feet on the floor (don't cross your legs). Elevate your arm to heart level on a table or a  desk. Use the proper sized cuff. It should fit smoothly and snugly around your bare upper arm. There should be enough room to slip a fingertip under the cuff. The bottom edge of the cuff should be 1 inch above the crease of the elbow. Ideally, take 3 measurements at one sitting and record the average.  Important lifestyle changes to control high blood pressure  Intervention  Effect on the BP  Lose extra pounds and watch your waistline Weight loss is one of the most effective lifestyle changes for controlling blood pressure. If you're overweight or obese, losing even a small amount of weight can help reduce blood pressure. Blood pressure might go down by about 1 millimeter of mercury (mm Hg) with each kilogram (about 2.2 pounds) of weight lost.  Exercise regularly As a general goal, aim for at least 30 minutes of moderate physical activity every day. Regular physical activity can lower high blood pressure by about 5 to 8 mm Hg.  Eat a healthy diet Eating a diet rich in whole grains, fruits, vegetables, and low-fat dairy products and low in saturated fat and cholesterol. A healthy diet can lower high blood pressure by up to 11 mm Hg.  Reduce salt (sodium) in your diet Even a small reduction of sodium in the diet can improve heart health and reduce high blood pressure by  about 5 to 6 mm Hg.  Limit alcohol One drink equals 12 ounces of beer, 5 ounces of wine, or 1.5 ounces of 80-proof liquor.  Limiting alcohol to less than one drink a day for women or two drinks a day for men can help lower blood pressure by about 4 mm Hg.   If you have any questions or concerns please use My Chart to send questions or call the office at [phone]

## 2022-02-12 NOTE — Telephone Encounter (Unsigned)
Repatha PA initiated  (Key: [id number]) The request has been approved till 01/13/2023  Enrolled int he grant for co-pay assistance

## 2022-02-12 NOTE — Assessment & Plan Note (Signed)
Assessment: BP is controlled in office BP 128/70 mmHg (goal <130/80) Have home BP arm cuff but does not monitor at home  Takes current BP medications regularly and tolerates them well without any side effects Denies SOB, palpitation, chest pain, headaches,or swelling Wheel chair bound but able to walk few steps at a time during physical therapy sessions. Does not repeat walking at home as he is fearful of falling   Plan:  No changes to the current medications  Continue taking  carvedilol 12.5 mg twice daily, diltiazem 30 mg twice daily, losartan 12.5 mg daily Reiterated the importance of regular exercise ( upper body stretches while sitting in the chair)and low salt diet  Patient to check BP at home at least twice week and report office if it consistently stays above the goal  Follow up with APP in 6 months is on recall list

## 2022-02-12 NOTE — Assessment & Plan Note (Signed)
Assessment:  LDL goal: <55  mg/dl last LDLc 101 mg/dl Lp(a):222 (12/24/2021)  Tolerates high intensity statins well without any side effects  Given hx of stroke LDLc goal <55 and  no effect on Lpa and only 15-20 % LDLc lowering effect from ezetimibe it is resonalbe to consider one of the injectables as next option Discussed next potential options (PCSK-9 inhibitors,inclisiran); cost, dosing efficacy, side effects  Patient and his wife are in agreement to proceed with PCSK9i therapy    Plan: Continue taking current medications atorvastatin 80 mg  Will apply for PA for PCSK9i; will inform patient upon approval (prefers MyChart message) Lipid lab due in 2-3 months after starting [facility]

## 2022-02-14 ENCOUNTER — Encounter: Payer: Self-pay | Admitting: Physical Medicine & Rehabilitation

## 2022-02-14 ENCOUNTER — Encounter
Payer: No Typology Code available for payment source | Attending: Physical Medicine and Rehabilitation | Admitting: Physical Medicine & Rehabilitation

## 2022-02-14 VITALS — BP 131/82 | HR 74 | Ht 64.0 in

## 2022-02-14 DIAGNOSIS — I639 Cerebral infarction, unspecified: Secondary | ICD-10-CM | POA: Insufficient documentation

## 2022-02-14 DIAGNOSIS — G8114 Spastic hemiplegia affecting left nondominant side: Secondary | ICD-10-CM | POA: Insufficient documentation

## 2022-02-14 MED ORDER — INCOBOTULINUMTOXINA 100 UNITS IM SOLR
200.0000 [IU] | Freq: Once | INTRAMUSCULAR | Status: AC
  Administered 2022-02-14: 200 [IU] via INTRAMUSCULAR

## 2022-02-14 NOTE — Progress Notes (Signed)
Xeomin Injection for spasticity using needle EMG guidance  Dilution: 50 Units/ml Indication: Severe spasticity which interferes with ADL,mobility and/or  hygiene and is unresponsive to medication management and other conservative care Informed consent was obtained after describing risks and benefits of the procedure with the patient. This includes bleeding, bruising, infection, excessive weakness, or medication side effects. A REMS form is on file and signed.  27g 1" needle electrode Number of units per muscle FCR 50 FPL 25  FDP 50 FDS 50 Pectoralis 25 All injections were done after obtaining appropriate EMG activity and after negative drawback for blood. The patient tolerated the procedure well. Post procedure instructions were given. A followup appointment was made.   If treatment of pectoralis is a goal, would need at least 300U of Xeomin or Botox

## 2022-02-18 ENCOUNTER — Ambulatory Visit: Payer: BC Managed Care – PPO | Admitting: Physical Medicine & Rehabilitation

## 2022-02-22 ENCOUNTER — Other Ambulatory Visit: Payer: Self-pay | Admitting: Internal Medicine

## 2022-02-22 DIAGNOSIS — K2210 Ulcer of esophagus without bleeding: Secondary | ICD-10-CM

## 2022-03-13 ENCOUNTER — Other Ambulatory Visit: Payer: Self-pay | Admitting: Physician Assistant

## 2022-03-13 DIAGNOSIS — K2210 Ulcer of esophagus without bleeding: Secondary | ICD-10-CM

## 2022-03-31 ENCOUNTER — Encounter: Payer: 59 | Admitting: Physical Medicine and Rehabilitation

## 2022-04-14 ENCOUNTER — Encounter
Payer: No Typology Code available for payment source | Attending: Physical Medicine and Rehabilitation | Admitting: Physical Medicine and Rehabilitation

## 2022-04-14 ENCOUNTER — Encounter: Payer: Self-pay | Admitting: Physical Medicine and Rehabilitation

## 2022-04-14 VITALS — BP 145/91 | HR 81 | Ht 64.0 in | Wt 220.0 lb

## 2022-04-14 DIAGNOSIS — I69328 Other speech and language deficits following cerebral infarction: Secondary | ICD-10-CM | POA: Diagnosis not present

## 2022-04-14 DIAGNOSIS — I69359 Hemiplegia and hemiparesis following cerebral infarction affecting unspecified side: Secondary | ICD-10-CM | POA: Diagnosis not present

## 2022-04-14 DIAGNOSIS — I69398 Other sequelae of cerebral infarction: Secondary | ICD-10-CM | POA: Diagnosis not present

## 2022-04-14 DIAGNOSIS — N184 Chronic kidney disease, stage 4 (severe): Secondary | ICD-10-CM | POA: Insufficient documentation

## 2022-04-14 DIAGNOSIS — R32 Unspecified urinary incontinence: Secondary | ICD-10-CM | POA: Insufficient documentation

## 2022-04-14 DIAGNOSIS — I639 Cerebral infarction, unspecified: Secondary | ICD-10-CM | POA: Diagnosis not present

## 2022-04-14 DIAGNOSIS — I6350 Cerebral infarction due to unspecified occlusion or stenosis of unspecified cerebral artery: Secondary | ICD-10-CM | POA: Diagnosis not present

## 2022-04-14 DIAGNOSIS — G8114 Spastic hemiplegia affecting left nondominant side: Secondary | ICD-10-CM | POA: Insufficient documentation

## 2022-04-14 DIAGNOSIS — R252 Cramp and spasm: Secondary | ICD-10-CM | POA: Diagnosis not present

## 2022-04-14 DIAGNOSIS — L89154 Pressure ulcer of sacral region, stage 4: Secondary | ICD-10-CM | POA: Diagnosis not present

## 2022-04-14 MED ORDER — SOLIFENACIN SUCCINATE 10 MG PO TABS: 10.0000 mg | ORAL_TABLET | Freq: Every day | ORAL | 5 refills | Status: DC

## 2022-04-14 NOTE — Patient Instructions (Signed)
Pt is a 57  yr old male with previous CVA with L hemiplegia- with recent new onset Afib, and worsening spasticity; sacral unstagable ulcer-; gout flare- resolved; Acute on chornic AKI/CKD 3; DM with A1c of 6.7  Here for f/u on CVA.    STOP Gemtesa; Start Vesicare- 10 mg daily- for bladder- see if more helpful- if not, can go back to [place], if Vesicare not effective.    Educated that takes a lot more medicine to reduce tone from MAS of 4 to a 3 than from 3- to 2. I think his tone is 25-40% better than last time-   3.  CBGs doing OK per pt- ~ 150- would correlate with his A1c of 6.4 last time in 11/23.    4. Last Cr was 2.12- up a lot from prior Cr of 1.76- suggest they/PCP sends him to kidney doctor- to manage his kidney function-   5.  Will get another appointment after 5/20- for Botox of LUE-pretty helpful so far, however,  suggest increasing somewhat of Botox dose, if possible. Has improved some, but hopeful for more.   6. Wait on Spasticity oral meds at this time, since most of tone is on LUE.   7.  Still has sacral ulcer- was unstageable. Not seeing wound care for it. Thinks ulcer still there- is still a little painful. Will have pt ask wife if needs referral to wound care and if so, will send referral.    8. F/U in 3 months- alternate with Botox appointments.   9. Place Urology consult. For Urinary incontinence related to storke- have tried Oxybutynin -was helpful but side effects too bad- tried [place] insurance denied myrbetriq- now trying [employer]

## 2022-04-14 NOTE — Progress Notes (Signed)
Subjective:    Patient ID: [name], male    DOB: [birth year], 57 y.o.   MRN: [id number]  HPI  Pt is a 57  yr old male with previous CVA with L hemiplegia- with recent new onset Afib, and worsening spasticity; sacral unstagable ulcer-; gout flare- resolved; Acute on chornic AKI/CKD 3; DM with A1c of 6.7  Here for f/u on CVA.    Saw Dr Letta Pate for Botox- "Slowly helping" .  Botox done 02/14/22  FCR 50 FPL 25   FDP 50 FDS 50 Pectoralis 25   Feels like it's helped a little bit- loosened him up- maybe 10% better after Botox. However we discussed that it's actually, on exam, better than 10%- likely 25-40% Pain in shoulder and LUE is better, however, so it's helped that way.     Got Myrbetriq!!! Cannot tell if it's helped.  Seems to him like he "goes more".  Did have less dry mouth.    Cbgs have been "OK"- running ~ 150s.    Pain Inventory Average Pain 0 Pain Right Now 0   BOWEL Number of stools per week: 7 Oral laxative use No   BLADDER Normal and Pads  Mobility do you drive?  no use a wheelchair  Function disabled: date disabled . I need assistance with the following:  dressing, bathing, toileting, meal prep, household duties, and shopping  Neuro/Psych trouble walking  Prior Studies Any changes since last visit?  no  Physicians involved in your care Any changes since last visit?  no   Family History  Problem Relation Age of Onset   Hypertension Mother    Heart disease Mother    Hyperlipidemia Mother    Heart failure Mother    Hypertension Sister    Hypertension Brother    Stroke Father    Hypertension Father    Colon cancer Maternal Grandmother    Diabetes Maternal Grandmother    Esophageal cancer Neg Hx    Social History   Socioeconomic History   Marital status: Married    Spouse name: [name]   Number of children: 1   Years of education: 16   Highest education level: Not on file  Occupational History   Occupation:  disabled  Tobacco Use   Smoking status: Never   Smokeless tobacco: Never  Vaping Use   Vaping Use: Never used  Substance and Sexual Activity   Alcohol use: Not Currently   Drug use: No   Sexual activity: Not Currently  Other Topics Concern   Not on file  Social History Narrative   Patient drinks caffeine a couple times a week.   Patient is right handed.   Lives with wife [name]) and daughter   Social Determinants of Health   Financial Resource Strain: Not on file  Food Insecurity: Not on file  Transportation Needs: Not on file  Physical Activity: Not on file  Stress: Not on file  Social Connections: Not on file   Past Surgical History:  Procedure Laterality Date   APPENDECTOMY     BIOPSY  11/18/2020   Procedure: BIOPSY;  Surgeon: Sharyn Creamer, MD;  Location: [place];  Service: Gastroenterology;;   COLONOSCOPY N/A 11/18/2020   Procedure: COLONOSCOPY;  Surgeon: Sharyn Creamer, MD;  Location: [place];  Service: Gastroenterology;  Laterality: N/A;   ESOPHAGOGASTRODUODENOSCOPY (EGD) WITH PROPOFOL N/A 11/18/2020   Procedure: ESOPHAGOGASTRODUODENOSCOPY (EGD) WITH PROPOFOL;  Surgeon: Sharyn Creamer, MD;  Location: [place];  Service: Gastroenterology;  Laterality: N/A;  JOINT REPLACEMENT Left 2004   Left hip   POLYPECTOMY  11/18/2020   Procedure: POLYPECTOMY;  Surgeon: Sharyn Creamer, MD;  Location: [facility] ENDOSCOPY;  Service: Gastroenterology;;   TOTAL HIP ARTHROPLASTY     Past Medical History:  Diagnosis Date   Diabetes mellitus without complication    Gait abnormality 03/31/2018   Hemiparesis and speech and language deficit as late effects of stroke 03/31/2018   Hypertension    Osteomyelitis of coccyx 02/19/2021   Rectal cancer 2009   rectal   Sacral osteomyelitis 02/19/2021   Seizure    Stroke 2018   BP (!) 145/91   Pulse 81   Ht 5\' 4"  (1.626 m)   Wt 220 lb (99.8 kg) Comment: REPORTED  SpO2 95%   BMI 37.76 kg/m   Opioid Risk Score:   Fall Risk Score:   `1  Depression screen [facility] 2/9     04/14/2022    9:57 AM 02/14/2022    9:27 AM 12/11/2021    2:21 PM 05/28/2021    9:55 AM 02/19/2021    8:58 AM 01/10/2021    2:26 PM 01/02/2021    9:53 AM  Depression screen PHQ 2/9  Decreased Interest 0 0 0 0 0 0 0  Down, Depressed, Hopeless 0 0 0 0 0 1 0  PHQ - 2 Score 0 0 0 0 0 1 0  Altered sleeping    0  0   Tired, decreased energy    0  0   Change in appetite    0  0   Feeling bad or failure about yourself     0  0   Trouble concentrating    0  0   Moving slowly or fidgety/restless    0  0   Suicidal thoughts    0  0   PHQ-9 Score    0  1   Difficult doing work/chores      Not difficult at all      Review of Systems  Constitutional: Negative.   HENT: Negative.    Eyes: Negative.   Respiratory: Negative.    Cardiovascular: Negative.   Gastrointestinal: Negative.   Endocrine: Negative.   Genitourinary: Negative.   Musculoskeletal:  Positive for gait problem.  Skin: Negative.   Allergic/Immunologic: Negative.   Hematological:  Bruises/bleeds easily.       Eliquis  Psychiatric/Behavioral: Negative.    All other systems reviewed and are negative.      Objective:   Physical Exam  Awake, alert, appropriate, delayed responses -chronic, NAD Looks at floor the entire visit.  Neuro: Spasticity- MAS of 2 in L fingers- and they aren't in actual ball- only curled at DIPs anymore. MAS of 1+ in L wrist MAS of 2 in L elbow MAS of 3 in L shoulder L hip MAS of 2 and L knee is 2- lacking ~ 5 degrees of L knee extension 2-3 beats clonus in LLE (+) hoffman's in LUE No yeast in palm this time, which is new.      Assessment & Plan:   Pt is a 57  yr old male with previous CVA with L hemiplegia- with recent new onset Afib, and worsening spasticity; sacral unstagable ulcer-; gout flare- resolved; Acute on chornic AKI/CKD 3; DM with A1c of 6.7  Here for f/u on CVA.    STOP Gemtesa; Start Vesicare- 10 mg daily- for bladder- see if more helpful-  if not, can go back to [place], if Vesicare not  effective.    Educated that takes a lot more medicine to reduce tone from MAS of 4 to a 3 than from 3- to 2. I think his tone is 25-40% better than last time-   3.  CBGs doing OK per pt- ~ 150- would correlate with his A1c of 6.4 last time in 11/23.    4. Last Cr was 2.12- up a lot from prior Cr of 1.76- suggest they/PCP sends him to kidney doctor- to manage his kidney function-   5.  Will get another appointment after 5/20- for Botox of LUE-pretty helpful so far, however,  suggest increasing somewhat of Botox dose, if possible. Has improved some, but hopeful for more.   6. Wait on Spasticity oral meds at this time, since most of tone is on LUE.   7.  Still has sacral ulcer- was unstageable. Not seeing wound care for it. Thinks ulcer still there- is still a little painful. Will have pt ask wife if needs referral to wound care and if so, will send referral.    8. F/U in 3 months- alternate with Botox appointments.   9. Place Urology consult. For Urinary incontinence related to storke- have tried Oxybutynin -was helpful but side effects too bad- tried [place] insurance denied myrbetriq- now trying Vesicare   I spent a total of  34  minutes on total care today- >50% coordination of care- due to education of pt on spasticity, on sacral wound and need for wound care. Also placing referral for Urology, discussing CKD worsening and discussing bladder issues.

## 2022-04-17 ENCOUNTER — Ambulatory Visit: Payer: Medicare HMO | Attending: Internal Medicine | Admitting: Internal Medicine

## 2022-04-17 ENCOUNTER — Encounter: Payer: Self-pay | Admitting: Internal Medicine

## 2022-04-17 VITALS — BP 147/95 | HR 83 | Temp 98.1°F | Ht 64.0 in | Wt 200.0 lb

## 2022-04-17 DIAGNOSIS — I480 Paroxysmal atrial fibrillation: Secondary | ICD-10-CM | POA: Diagnosis not present

## 2022-04-17 DIAGNOSIS — E1159 Type 2 diabetes mellitus with other circulatory complications: Secondary | ICD-10-CM | POA: Diagnosis not present

## 2022-04-17 DIAGNOSIS — I5189 Other ill-defined heart diseases: Secondary | ICD-10-CM | POA: Diagnosis not present

## 2022-04-17 DIAGNOSIS — Z85048 Personal history of other malignant neoplasm of rectum, rectosigmoid junction, and anus: Secondary | ICD-10-CM | POA: Diagnosis not present

## 2022-04-17 DIAGNOSIS — N1832 Chronic kidney disease, stage 3b: Secondary | ICD-10-CM | POA: Diagnosis not present

## 2022-04-17 DIAGNOSIS — D649 Anemia, unspecified: Secondary | ICD-10-CM | POA: Diagnosis not present

## 2022-04-17 DIAGNOSIS — I152 Hypertension secondary to endocrine disorders: Secondary | ICD-10-CM | POA: Diagnosis not present

## 2022-04-17 DIAGNOSIS — I69354 Hemiplegia and hemiparesis following cerebral infarction affecting left non-dominant side: Secondary | ICD-10-CM

## 2022-04-17 DIAGNOSIS — I700 Atherosclerosis of aorta: Secondary | ICD-10-CM | POA: Diagnosis not present

## 2022-04-17 LAB — POCT GLYCOSYLATED HEMOGLOBIN (HGB A1C): HbA1c, POC (controlled diabetic range): 6.2 % (ref 0.0–7.0)

## 2022-04-17 LAB — GLUCOSE, POCT (MANUAL RESULT ENTRY): POC Glucose: 141 mg/dl — AB (ref 70–99)

## 2022-04-17 MED ORDER — DILTIAZEM HCL 30 MG PO TABS
ORAL_TABLET | ORAL | 1 refills | Status: DC
Start: 2022-04-17 — End: 2022-09-24

## 2022-04-17 NOTE — Progress Notes (Signed)
Patient ID: [name], male    DOB: 01/24/1965  MRN: [id number]  CC: Follow-up (Follow up /No to shingles vax. )   Subjective: [name] is a 57 y.o. male who presents for chronic ds management.  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   Did get mobility chair since last visit with me 05/2021.  He finds it helpful. Saw PMR Dr. Dagoberto Ligas 3 days ago.    Started on Botox inj to LUE.  Helps a little Referred to urologist for incontinence.  Told to hold Gemtesa and Try Vesicare 10 mg.  Pt has not picked up as yet.  We agreed to take the Gemtesa off the list to avoid confusion Told Dr. Alveta Heimlich that he still has sacral ulcer x 1 wk.  His wife is not aware of this and does not think that he has 1.  DM: Results for orders placed or performed in visit on 04/17/22  POCT glucose (manual entry)  Result Value Ref Range   POC Glucose 141 (A) 70 - 99 mg/dl  POCT glycosylated hemoglobin (Hb A1C)  Result Value Ref Range   Hemoglobin A1C     HbA1c POC (<> result, manual entry)     HbA1c, POC (prediabetic range)     HbA1c, POC (controlled diabetic range) 6.2 0.0 - 7.0 %  Compliant with meds - Glucotrol 5 mg and Jardiance 10 mg daily.  Started on Jardiance by cardiology Dr. Irish Lack on last visit in December for renal protection and his history of diastolic dysfunction Does not check BS Does okay with eating habits. Snacks on fruits and veggies  HTN/CKD 3: compliant with Coreg 12.5 mg and Cardizem 30 mg BID for both, Cozaar 25 mg 1/2 daily.  Out of Cardizem x 1 wk Limits salt in foods Kidney function had declined some from when I last saw him.  GFR has ranged from 50-36 with most recent being 40.  Creatinine ranged from 1.61-2.12 with most recent being  1.95  Aortic atherosclerosis/A.fib:  started on Repatha last mth by cardiology.  Also on Lipitor 80 mg -on Eliquis by cardiologist in lieu of aspirin; no bruising or bleeding, no palpitations.  He has not had any falls.  Anemia:  taking iron supplement 3x/wk. has history of rectal cancer.  Last colonoscopy was 11/2020 that revealed some diverticulosis, also ascending hepatic flexure and 1 tubular adenoma polyp removed from the rectal area.  HM:  decline shingles vaccine.  Due for Medicare wellness visit.  Patient Active Problem List   Diagnosis Date Noted   Aortic atherosclerosis 04/17/2022   Stage 3b chronic kidney disease 04/17/2022   CKD (chronic kidney disease) stage 4, GFR 15-29 ml/min 04/14/2022   Spastic hemiparesis of left nondominant side due to acute cerebral infarction 04/14/2022   Hyperlipidemia 02/12/2022   Chronic pain syndrome 01/02/2021   Spasticity as late effect of cerebrovascular accident (CVA) 01/02/2021   Esophageal ulcer without bleeding 12/25/2020   Sacral decubitus ulcer, stage IV 12/25/2020   Adenomatous polyp of colon 12/25/2020   CKD (chronic kidney disease) stage 2, GFR 60-89 ml/min 12/25/2020   Sacral decubitus ulcer 12/17/2020   Hyponatremia 12/17/2020   Acute gastric ulcer    Controlled type 2 diabetes mellitus with hyperglycemia, without long-term current use of  insulin    New onset atrial fibrillation    Pressure injury of skin 11/26/2020   Debility 11/23/2020   History of colonic polyps    Nausea and vomiting    AKI (acute kidney injury) 11/14/2020   Atrial fibrillation with RVR 11/14/2020   History of CVA with residual deficit 11/14/2020   Gout 11/14/2020   Microcytic hypochromic anemia 11/14/2020   History of meningioma 11/14/2020   Obesity (BMI 30.0-34.9) 11/14/2020   Pain due to onychomycosis of toenails of both feet 09/21/2020   Edema of both legs 09/29/2019   Meningioma 04/13/2019   Hemiparesis and speech and language deficit as late  effects of stroke 03/31/2018   Gait abnormality 03/31/2018   Hemiplegia of left nondominant side as late effect of cerebral infarction 12/18/2017   Subacromial impingement of left shoulder 06/09/2017   Spastic neurogenic bladder 06/09/2017   Dysarthria as late effect of stroke 04/01/2017   Neurogenic bladder due to old stroke 12/25/2016   Right pontine cerebrovascular accident 12/18/2016   Left hemiparesis    Diastolic dysfunction    Dysphagia, post-stroke    Acute ischemic stroke 12/12/2016   Seizures 07/22/2014   Hypertension 07/22/2014   Type 2 diabetes mellitus with vascular disease 07/22/2014   Brain mass    Malignant neoplasm of rectum 09/02/2007     Current Outpatient Medications on File Prior to Visit  Medication Sig Dispense Refill   apixaban (ELIQUIS) 5 MG TABS tablet Take 1 tablet (5 mg total) by mouth 2 (two) times daily. 180 tablet 3   atorvastatin (LIPITOR) 80 MG tablet Take 1 tablet (80 mg total) by mouth daily. 90 tablet 1   carvedilol (COREG) 12.5 MG tablet TAKE 1 TABLET (12.5MG  TOTAL) BY MOUTH TWICE A DAY WITH MEALS 180 tablet 1   empagliflozin (JARDIANCE) 10 MG TABS tablet Take 1 tablet (10 mg total) by mouth daily before breakfast. 90 tablet 3   Evolocumab (REPATHA SURECLICK) [id number] MG/ML SOAJ Inject 140 mg into the skin every 14 (fourteen) days. 6 mL 3   ferrous sulfate 325 (65 FE) MG tablet 1 tab PO Q Mon/Wed/Fri 100 tablet 1   glipiZIDE (GLUCOTROL) 5 MG tablet Take 1 tablet (5 mg total) by mouth daily before breakfast. 90 tablet 1   losartan (COZAAR) 25 MG tablet Take 0.5 tablets (12.5 mg total) by mouth daily. 45 tablet 3   pantoprazole (PROTONIX) 40 MG tablet TAKE 1 TABLET BY MOUTH TWICE A DAY 180 tablet 0   solifenacin (VESICARE) 10 MG tablet Take 1 tablet (10 mg total) by mouth daily. For bladder issues related to stroke 30 tablet 5   meclizine (ANTIVERT) 12.5 MG tablet TAKE 1-2 TABLETS (12.5-25 MG TOTAL) BY MOUTH 3 (THREE) TIMES DAILY AS NEEDED FOR DIZZINESS.  (Patient not taking: Reported on 04/17/2022) 60 tablet 1   No current facility-administered medications on file prior to visit.    No Known Allergies  Social History   Socioeconomic History   Marital status: Married    Spouse name: [name]   Number of children: 1   Years of education: 16   Highest education level: Not on file  Occupational History   Occupation: disabled  Tobacco Use   Smoking status: Never   Smokeless tobacco: Never  Vaping Use   Vaping Use: Never used  Substance and Sexual Activity   Alcohol use: Not Currently   Drug use: No   Sexual activity: Not Currently  Other Topics Concern   Not on file  Social History  Narrative   Patient drinks caffeine a couple times a week.   Patient is right handed.   Lives with wife [name]) and daughter   Social Determinants of Health   Financial Resource Strain: Not on file  Food Insecurity: Not on file  Transportation Needs: Not on file  Physical Activity: Not on file  Stress: Not on file  Social Connections: Not on file  Intimate Partner Violence: Not on file    Family History  Problem Relation Age of Onset   Hypertension Mother    Heart disease Mother    Hyperlipidemia Mother    Heart failure Mother    Hypertension Sister    Hypertension Brother    Stroke Father    Hypertension Father    Colon cancer Maternal Grandmother    Diabetes Maternal Grandmother    Esophageal cancer Neg Hx     Past Surgical History:  Procedure Laterality Date   APPENDECTOMY     BIOPSY  11/18/2020   Procedure: BIOPSY;  Surgeon: Sharyn Creamer, MD;  Location[facility] ENDOSCOPY;  Service: Gastroenterology;;   COLONOSCOPY N/A 11/18/2020   Procedure: COLONOSCOPY;  Surgeon: Sharyn Creamer, MD;  Location: [place];  Service: Gastroenterology;  Laterality: N/A;   ESOPHAGOGASTRODUODENOSCOPY (EGD) WITH PROPOFOL N/A 11/18/2020   Procedure: ESOPHAGOGASTRODUODENOSCOPY (EGD) WITH PROPOFOL;  Surgeon: Sharyn Creamer, MD;  Location: [place];  Service: Gastroenterology;  Laterality: N/A;   JOINT REPLACEMENT Left 2004   Left hip   POLYPECTOMY  11/18/2020   Procedure: POLYPECTOMY;  Surgeon: Sharyn Creamer, MD;  Location: [facility] ENDOSCOPY;  Service: Gastroenterology;;   TOTAL HIP ARTHROPLASTY      ROS: Review of Systems Negative except as stated above  PHYSICAL EXAM: BP (!) 147/95 (BP Location: Right Arm, Patient Position: Sitting, Cuff Size: Large)   Pulse 83   Temp 98.1 F (36.7 C) (Oral)   Ht 5\' 4"  (1.626 m)   Wt 200 lb (90.7 kg)   SpO2 97%   BMI 34.33 kg/m   Wt Readings from Last 3 Encounters:  04/17/22 200 lb (90.7 kg)  04/14/22 220 lb (99.8 kg)  12/16/21 215 lb (97.5 kg)  Repeat blood pressure 148/93  Physical Exam   General appearance - alert, well appearing, and in no distress.  Patient sitting in manual wheelchair. Mental status -patient is alert.  He answers questions appropriately. Mouth -oral mucosa is moist. Neck - supple, no significant adenopathy Chest - clear to auscultation, no wheezes, rales or rhonchi, symmetric air entry Heart -sounds to be in sinus rhythm. Extremities -no lower extremity edema. Diabetic Foot Exam - Simple   Simple Foot Form Diabetic Foot exam was performed with the following findings: Yes 04/17/2022 11:43 AM  Visual Inspection See comments: Yes Sensation Testing Intact to touch and monofilament testing bilaterally: Yes Pulse Check Posterior Tibialis and Dorsalis pulse intact bilaterally: Yes Comments Toe nails thick and discolored and overgrown.  Flat footed.  Skin on dorsum and plantar surfaces dry and flacking.  Wife declines referral to podiatry for nail clipping.  She will do it.        04/17/2022   10:35 AM 04/14/2022    9:57 AM 02/14/2022    9:27 AM  Depression screen PHQ 2/9  Decreased Interest 0 0 0  Down, Depressed, Hopeless 0 0 0  PHQ - 2 Score 0 0 0  Altered sleeping 0    Tired, decreased energy 0    Change in appetite 0    Feeling bad or  failure  about yourself  0    Trouble concentrating 0    Moving slowly or fidgety/restless 0    Suicidal thoughts 0    PHQ-9 Score 0         Latest Ref Rng & Units 01/02/2022   12:36 PM 12/24/2021    9:12 AM 12/11/2021    2:46 PM  CMP  Glucose 70 - 99 mg/dL 78  131  131   BUN 6 - 24 mg/dL 20  31  20    Creatinine 0.76 - 1.27 mg/dL 1.95  2.12  1.76   Sodium 134 - 144 mmol/L 143  145  146   Potassium 3.5 - 5.2 mmol/L 4.7  4.1  4.5   Chloride 96 - 106 mmol/L 107  108  108   CO2 20 - 29 mmol/L 22  23  23    Calcium 8.7 - 10.2 mg/dL 9.5  9.4  9.1   Total Protein 6.0 - 8.5 g/dL  7.0  7.1   Total Bilirubin 0.0 - 1.2 mg/dL  0.3  0.4   Alkaline Phos 44 - 121 IU/L  99  97   AST 0 - 40 IU/L  16  14   ALT 0 - 44 IU/L  24  15    Lipid Panel     Component Value Date/Time   CHOL 177 12/24/2021 0912   TRIG 153 (H) 12/24/2021 0912   HDL 49 12/24/2021 0912   CHOLHDL 3.6 12/24/2021 0912   CHOLHDL 3.9 12/13/2016 0421   VLDL 15 12/13/2016 0421   LDLCALC 101 (H) 12/24/2021 0912    CBC    Component Value Date/Time   WBC 4.0 12/11/2021 1446   WBC 4.5 04/16/2021 1017   RBC 4.51 12/11/2021 1446   RBC 4.51 04/16/2021 1017   HGB 12.2 (L) 12/11/2021 1446   HGB 14.2 08/02/2008 1026   HCT 37.1 (L) 12/11/2021 1446   HCT 41.6 08/02/2008 1026   PLT 220 12/11/2021 1446   MCV 82 12/11/2021 1446   MCV 78.4 (L) 08/02/2008 1026   MCH 27.1 12/11/2021 1446   MCH 26.4 (L) 04/16/2021 1017   MCHC 32.9 12/11/2021 1446   MCHC 33.5 04/16/2021 1017   RDW 14.4 12/11/2021 1446   RDW 16.3 (H) 08/02/2008 1026   LYMPHSABS 1.2 12/11/2021 1446   LYMPHSABS 0.6 (L) 08/02/2008 1026   MONOABS 0.4 02/07/2021 0943   MONOABS 0.2 08/02/2008 1026   EOSABS 0.1 12/11/2021 1446   BASOSABS 0.0 12/11/2021 1446   BASOSABS 0.0 08/02/2008 1026    ASSESSMENT AND PLAN: 1. Type 2 diabetes mellitus with vascular disease At goal. Encourage him to continue healthy eating habits.  Continue Jardiance. - POCT glucose (manual entry) -  POCT glycosylated hemoglobin (Hb A1C) - Microalbumin / creatinine urine ratio  2. Hypertension associated with type 2 diabetes mellitus Not at goal but he has been out of the Cardizem for 1 week.  He will continue current dose of Cozaar 12.5 mg daily, Cardizem 30 mg twice a day, carvedilol 12.5 mg twice a day Refill sent on Cardizem.  3. PAF (paroxysmal atrial fibrillation) Now on Eliquis.  Discussed the importance of trying to avoid falls as much as possible while on blood thinners.  4. Diastolic dysfunction Continue Jardiance  5. Aortic atherosclerosis On Repatha and atorvastatin through his cardiologist  6. Stage 3b chronic kidney disease Continue to monitor.  Advised to avoid NSAIDs. - Basic metabolic panel - Microalbumin / creatinine urine ratio - Ambulatory referral to Nephrology  7. Chronic anemia H&H had improved when last checked.  Recheck CBC today. - CBC  8. History of rectal cancer Up-to-date with colonoscopy.  He has not had any symptoms of rectal bleeding  9. Hemiparesis affecting left side as late effect of cerebrovascular accident (CVA) Stable.  Continue atorvastatin.  On Eliquis in lieu of aspirin. Patient does not have a transfer belt on and we do not have the manpower to lift him up from his manual wheelchair to check the sacral area.  Wife states that she will do so this evening and will call me and let me know if he does have a sacral ulcer so that we can refer to wound care.     Patient was given the opportunity to ask questions.  Patient verbalized understanding of the plan and was able to repeat key elements of the plan.   This documentation was completed using Radio producer.  Any transcriptional errors are unintentional.  Orders Placed This Encounter  Procedures   Basic metabolic panel   Microalbumin / creatinine urine ratio   CBC   Ambulatory referral to Nephrology   POCT glucose (manual entry)   POCT glycosylated  hemoglobin (Hb A1C)     Requested Prescriptions   Signed Prescriptions Disp Refills   diltiazem (CARDIZEM) 30 MG tablet 180 tablet 1    Sig: TAKE 1 TABLET bid    Return in about 4 months (around 08/17/2022) for Give appt with CMA in 2 wks for MWV.  Karle Plumber, MD, FACP

## 2022-04-18 LAB — BASIC METABOLIC PANEL
BUN/Creatinine Ratio: 11 (ref 9–20)
BUN: 21 mg/dL (ref 6–24)
CO2: 22 mmol/L (ref 20–29)
Calcium: 9.4 mg/dL (ref 8.7–10.2)
Chloride: 108 mmol/L — ABNORMAL HIGH (ref 96–106)
Creatinine, Ser: 1.98 mg/dL — ABNORMAL HIGH (ref 0.76–1.27)
Glucose: 101 mg/dL — ABNORMAL HIGH (ref 70–99)
Potassium: 4.3 mmol/L (ref 3.5–5.2)
Sodium: 146 mmol/L — ABNORMAL HIGH (ref 134–144)
eGFR: 39 mL/min/{1.73_m2} — ABNORMAL LOW (ref >59)

## 2022-04-18 LAB — CBC
Hematocrit: 37.1 % — ABNORMAL LOW (ref 37.5–51.0)
Hemoglobin: 12 g/dL — ABNORMAL LOW (ref 13.0–17.7)
MCH: 26.4 pg — ABNORMAL LOW (ref 26.6–33.0)
MCHC: 32.3 g/dL (ref 31.5–35.7)
MCV: 82 fL (ref 79–97)
Platelets: 204 10*3/uL (ref 150–450)
RBC: 4.55 x10E6/uL (ref 4.14–5.80)
RDW: 14.6 % (ref 11.6–15.4)
WBC: 4 10*3/uL (ref 3.4–10.8)

## 2022-04-18 LAB — MICROALBUMIN / CREATININE URINE RATIO
Creatinine, Urine: 80.1 mg/dL
Microalb/Creat Ratio: 30 mg/g creat — ABNORMAL HIGH (ref 0–29)
Microalbumin, Urine: 23.7 ug/mL

## 2022-04-22 ENCOUNTER — Ambulatory Visit: Payer: BC Managed Care – PPO | Admitting: Neurology

## 2022-04-22 ENCOUNTER — Encounter: Payer: Self-pay | Admitting: Neurology

## 2022-04-23 ENCOUNTER — Ambulatory Visit: Payer: Medicare HMO | Attending: Internal Medicine

## 2022-04-23 DIAGNOSIS — E1169 Type 2 diabetes mellitus with other specified complication: Secondary | ICD-10-CM

## 2022-04-23 DIAGNOSIS — E785 Hyperlipidemia, unspecified: Secondary | ICD-10-CM | POA: Diagnosis not present

## 2022-04-24 LAB — LIPID PANEL
Chol/HDL Ratio: 2.9 ratio (ref 0.0–5.0)
Cholesterol, Total: 129 mg/dL (ref 100–199)
HDL: 44 mg/dL (ref >39)
LDL Chol Calc (NIH): 58 mg/dL (ref 0–99)
Triglycerides: 161 mg/dL — ABNORMAL HIGH (ref 0–149)
VLDL Cholesterol Cal: 27 mg/dL (ref 5–40)

## 2022-05-02 ENCOUNTER — Ambulatory Visit: Payer: Medicare HMO | Attending: Nurse Practitioner

## 2022-05-02 ENCOUNTER — Telehealth: Payer: Self-pay

## 2022-05-02 DIAGNOSIS — E1169 Type 2 diabetes mellitus with other specified complication: Secondary | ICD-10-CM

## 2022-05-02 MED ORDER — EZETIMIBE 10 MG PO TABS
10.0000 mg | ORAL_TABLET | Freq: Every day | ORAL | 3 refills | Status: DC
Start: 2022-05-02 — End: 2022-12-31

## 2022-05-02 NOTE — Telephone Encounter (Signed)
Left message and advised that he also will have a message in My Chart.

## 2022-05-02 NOTE — Telephone Encounter (Signed)
-----   Message from Orbie Pyo, MD sent at 04/25/2022  8:19 PM EDT ----- Goal LDL < 55, start Zetia 10 and lipids/lfts in 2 months. ----- Message ----- From: Lendon Ka, RN Sent: 04/25/2022   5:04 PM EDT To: Orbie Pyo, MD   ----- Message ----- From: Lars Mage Sent: 04/25/2022   4:48 PM EDT To: Lendon Ka, RN; [facility] Pharmd  Looks like a Lynnette Caffey patient, but uses repatha so will forward to lipid clinic as well as RN ----- Message ----- From: Tonny Bollman, MD Sent: 04/24/2022   6:17 AM EDT To: Eugene Gavia K  Lipids in good range. Reviewed chart - don't think this is my patient.

## 2022-05-12 ENCOUNTER — Other Ambulatory Visit: Payer: Self-pay | Admitting: Cardiovascular Disease

## 2022-05-12 DIAGNOSIS — I700 Atherosclerosis of aorta: Secondary | ICD-10-CM

## 2022-05-12 DIAGNOSIS — E1169 Type 2 diabetes mellitus with other specified complication: Secondary | ICD-10-CM

## 2022-05-13 ENCOUNTER — Other Ambulatory Visit (HOSPITAL_COMMUNITY): Payer: Self-pay

## 2022-05-13 ENCOUNTER — Telehealth: Payer: Self-pay

## 2022-05-13 NOTE — Telephone Encounter (Signed)
Pharmacy Patient Advocate Encounter  Prior Authorization for Repatha 140mg /ml has been approved by [employer] (ins).    PA # [id number] Effective dates: 4.30.24 through 4.30.25  Spoke with Pharmacy to process.

## 2022-05-26 ENCOUNTER — Ambulatory Visit: Payer: Medicare HMO | Admitting: Physical Medicine and Rehabilitation

## 2022-05-29 MED ORDER — REPATHA SURECLICK 140 MG/ML ~~LOC~~ SOAJ
140.0000 mg | SUBCUTANEOUS | 1 refills | Status: DC
Start: 2022-05-29 — End: 2022-12-31

## 2022-05-29 NOTE — Addendum Note (Signed)
Addended by: Cheree Ditto on: 05/29/2022 04:29 PM   Modules accepted: Orders

## 2022-06-03 ENCOUNTER — Encounter
Payer: No Typology Code available for payment source | Attending: Physical Medicine and Rehabilitation | Admitting: Physical Medicine & Rehabilitation

## 2022-06-03 ENCOUNTER — Ambulatory Visit: Payer: Medicare HMO | Attending: Internal Medicine

## 2022-06-03 ENCOUNTER — Encounter: Payer: Self-pay | Admitting: Physical Medicine & Rehabilitation

## 2022-06-03 VITALS — BP 160/87 | Ht 64.0 in | Wt 200.0 lb

## 2022-06-03 DIAGNOSIS — G8110 Spastic hemiplegia affecting unspecified side: Secondary | ICD-10-CM | POA: Diagnosis not present

## 2022-06-03 DIAGNOSIS — R252 Cramp and spasm: Secondary | ICD-10-CM | POA: Diagnosis present

## 2022-06-03 DIAGNOSIS — I69398 Other sequelae of cerebral infarction: Secondary | ICD-10-CM | POA: Diagnosis present

## 2022-06-03 MED ORDER — SODIUM CHLORIDE (PF) 0.9 % IJ SOLN
6.0000 mL | Freq: Once | INTRAMUSCULAR | Status: AC
  Administered 2022-06-03: 6 mL

## 2022-06-03 MED ORDER — INCOBOTULINUMTOXINA 100 UNITS IM SOLR
300.0000 [IU] | Freq: Once | INTRAMUSCULAR | Status: AC
Start: 2022-06-03 — End: 2022-06-03
  Administered 2022-06-03: 300 [IU] via INTRAMUSCULAR

## 2022-06-03 NOTE — Progress Notes (Signed)
Xeomin Injection for spasticity using needle EMG guidance  Dilution: 50 Units/ml Indication: Severe spasticity which interferes with ADL,mobility and/or  hygiene and is unresponsive to medication management and other conservative care Informed consent was obtained after describing risks and benefits of the procedure with the patient. This includes bleeding, bruising, infection, excessive weakness, or medication side effects. A REMS form is on file and signed.  27g 1" needle electrode Number of units per muscle FCR 50 FPL 25  FDP 75 FDS 75 Pectoralis 75 All injections were done after obtaining appropriate EMG activity and after negative drawback for blood. The patient tolerated the procedure well. Post procedure instructions were given. A followup appointment was made.   If treatment of pectoralis is a goal, would need at least 300U of Xeomin or Botox

## 2022-06-03 NOTE — Patient Instructions (Signed)
You received a Xeomin injection today. You may experience soreness at the needle injection sites. Please call us if any of the injection sites turns red after a couple days or if there is any drainage. You may experience muscle weakness as a result of Xeomin This would improve with time but can take several weeks to improve. The Xeomin should start working in about one week. The Xeomin usually last 3 months. The injection can be repeated every 3 months as needed.  

## 2022-06-08 ENCOUNTER — Other Ambulatory Visit: Payer: Self-pay | Admitting: Physician Assistant

## 2022-06-08 DIAGNOSIS — E1159 Type 2 diabetes mellitus with other circulatory complications: Secondary | ICD-10-CM

## 2022-06-09 ENCOUNTER — Other Ambulatory Visit: Payer: Self-pay | Admitting: Physician Assistant

## 2022-06-09 DIAGNOSIS — K2210 Ulcer of esophagus without bleeding: Secondary | ICD-10-CM

## 2022-06-10 NOTE — Telephone Encounter (Signed)
Requested Prescriptions  Pending Prescriptions Disp Refills   glipiZIDE (GLUCOTROL) 5 MG tablet [Pharmacy Med Name: GLIPIZIDE 5 MG TABLET] 90 tablet 0    Sig: TAKE 1 TABLET BY MOUTH DAILY BEFORE BREAKFAST.     Endocrinology:  Diabetes - Sulfonylureas Failed - 06/08/2022 12:30 PM      Failed - Cr in normal range and within 360 days    Creat  Date Value Ref Range Status  04/16/2021 1.61 (H) 0.70 - 1.30 mg/dL Final   Creatinine, Ser  Date Value Ref Range Status  04/17/2022 1.98 (H) 0.76 - 1.27 mg/dL Final         Passed - HBA1C is between 0 and 7.9 and within 180 days    HbA1c, POC (prediabetic range)  Date Value Ref Range Status  09/29/2019 6.2 5.7 - 6.4 % Final   HbA1c, POC (controlled diabetic range)  Date Value Ref Range Status  04/17/2022 6.2 0.0 - 7.0 % Final         Passed - Valid encounter within last 6 months    Recent Outpatient Visits           1 month ago Type 2 diabetes mellitus with vascular disease (HCC)   [place] [facility] Marcine Matar, MD   6 months ago Physical deconditioning   [facility] [place], [place], [place]   1 year ago Hemiparesis affecting left side as late effect of cerebrovascular accident (CVA) [facility])   [place] [facility] Marcine Matar, MD   1 year ago Appointment canceled by hospital   [facility] Marcine Matar, MD   1 year ago Appointment canceled by hospital   [facility] Marcine Matar, MD       Future Appointments             In 2 months Marcine Matar, MD [facility]

## 2022-07-03 ENCOUNTER — Other Ambulatory Visit: Payer: Self-pay | Admitting: Physician Assistant

## 2022-07-03 DIAGNOSIS — K2210 Ulcer of esophagus without bleeding: Secondary | ICD-10-CM

## 2022-07-10 ENCOUNTER — Other Ambulatory Visit: Payer: Self-pay | Admitting: Physician Assistant

## 2022-07-10 DIAGNOSIS — I6930 Unspecified sequelae of cerebral infarction: Secondary | ICD-10-CM

## 2022-07-10 NOTE — Telephone Encounter (Signed)
Requested Prescriptions  Pending Prescriptions Disp Refills   atorvastatin (LIPITOR) 80 MG tablet [Pharmacy Med Name: ATORVASTATIN 80 MG TABLET] 90 tablet 1    Sig: TAKE 1 TABLET BY MOUTH EVERY DAY     Cardiovascular:  Antilipid - Statins Failed - 07/10/2022 12:06 AM      Failed - Lipid Panel in normal range within the last 12 months    Cholesterol, Total  Date Value Ref Range Status  04/23/2022 129 100 - 199 mg/dL Final   LDL Chol Calc (NIH)  Date Value Ref Range Status  04/23/2022 58 0 - 99 mg/dL Final   HDL  Date Value Ref Range Status  04/23/2022 44 >39 mg/dL Final   Triglycerides  Date Value Ref Range Status  04/23/2022 161 (H) 0 - 149 mg/dL Final         Passed - Patient is not pregnant      Passed - Valid encounter within last 12 months    Recent Outpatient Visits           2 months ago Type 2 diabetes mellitus with vascular disease (HCC)   [facility] Marcine Matar, MD   7 months ago Physical deconditioning   [place] [facility] [place], [place], [place]   1 year ago Hemiparesis affecting left side as late effect of cerebrovascular accident (CVA) [facility])   [facility] Marcine Matar, MD   1 year ago Appointment canceled by hospital   [facility] Marcine Matar, MD   1 year ago Appointment canceled by hospital   [facility] Marcine Matar, MD       Future Appointments             In 1 month Laural Benes, Binnie Rail, MD [facility]

## 2022-07-16 ENCOUNTER — Encounter: Payer: Self-pay | Admitting: Physical Medicine and Rehabilitation

## 2022-07-16 ENCOUNTER — Encounter
Payer: No Typology Code available for payment source | Attending: Physical Medicine and Rehabilitation | Admitting: Physical Medicine and Rehabilitation

## 2022-07-16 VITALS — BP 125/75 | HR 92 | Ht 64.0 in | Wt 200.0 lb

## 2022-07-16 DIAGNOSIS — I69354 Hemiplegia and hemiparesis following cerebral infarction affecting left non-dominant side: Secondary | ICD-10-CM | POA: Diagnosis not present

## 2022-07-16 DIAGNOSIS — I639 Cerebral infarction, unspecified: Secondary | ICD-10-CM | POA: Diagnosis not present

## 2022-07-16 DIAGNOSIS — G8114 Spastic hemiplegia affecting left nondominant side: Secondary | ICD-10-CM | POA: Insufficient documentation

## 2022-07-16 DIAGNOSIS — I69398 Other sequelae of cerebral infarction: Secondary | ICD-10-CM | POA: Diagnosis not present

## 2022-07-16 DIAGNOSIS — R252 Cramp and spasm: Secondary | ICD-10-CM | POA: Insufficient documentation

## 2022-07-16 MED ORDER — DANTROLENE SODIUM 50 MG PO CAPS: 50.0000 mg | ORAL_CAPSULE | Freq: Every day | ORAL | 5 refills | Status: DC

## 2022-07-16 NOTE — Progress Notes (Signed)
Subjective:    Patient ID: [name], male    DOB: 1965/01/30, 57 y.o.   MRN: [id number]  HPI  Pt is a 57  yr old male with previous CVA with L hemiplegia- with recent new onset Afib, and worsening spasticity; sacral unstagable ulcer-; gout flare- resolved; Acute on chornic AKI/CKD 3; DM with A1c of 6.7  Here for f/u on CVA.   Pt got Botox last 06/03/22 by Dr Wynn Banker-  LUE-  FCR 50 FPL 25 FDP 75 FDS 75 Pectoralis 75  300 units total   Got Botox again in May-  Was painful-  Is a little looser per pt.  Mainly gets in LUE   Pain is from LLE- due to spasticity in LLE.  Last Lfts done 12/23- AST 16 and ALT 24.   Every now and then, will have clonus, by description. Not painful.   No other issues per pt, except spasticity and pain from LLE spasticity.  Has formed stools- not loose.    Wearing WHO on L hand every now and then   Is in manual w/c- has a power w/c at home- power w/c at home, is uncomfortable- doesn't  remember who got it for him.   Pain Inventory Average Pain 4 Pain Right Now 0 My pain is intermittent, dull, and aching  LOCATION OF PAIN  LT leg  BOWEL Number of stools per week: 14 Oral laxative use No  Type of laxative n/a Enema or suppository use No  History of colostomy No  Incontinent No   BLADDER Normal In and out cath, frequency n/a Able to self cath  n/a Bladder incontinence No  Frequent urination No  Leakage with coughing No  Difficulty starting stream No  Incomplete bladder emptying No    Mobility walk with assistance use a Mark ability to climb steps?  no do you drive?  no use a wheelchair transfers alone Do you have any goals in this area?  yes  Function disabled: date disabled 35  Neuro/Psych weakness numbness tremor tingling trouble walking dizziness  Prior Studies N/A  Physicians involved in your care Follow-up   Family History  Problem Relation Age of Onset   Hypertension Mother    Heart  disease Mother    Hyperlipidemia Mother    Heart failure Mother    Hypertension Sister    Hypertension Brother    Stroke Father    Hypertension Father    Colon cancer Maternal Grandmother    Diabetes Maternal Grandmother    Esophageal cancer Neg Hx    Social History   Socioeconomic History   Marital status: Married    Spouse name: Marcelino Duster   Number of children: 1   Years of education: 16   Highest education level: Not on file  Occupational History   Occupation: disabled  Tobacco Use   Smoking status: Never   Smokeless tobacco: Never  Vaping Use   Vaping Use: Never used  Substance and Sexual Activity   Alcohol use: Not Currently   Drug use: No   Sexual activity: Not Currently  Other Topics Concern   Not on file  Social History Narrative   Patient drinks caffeine a couple times a week.   Patient is right handed.   Lives with wife Marcelino Duster) and daughter   Social Determinants of Health   Financial Resource Strain: Not on file  Food Insecurity: Not on file  Transportation Needs: Not on file  Physical Activity: Not on file  Stress: Not  on file  Social Connections: Not on file   Past Surgical History:  Procedure Laterality Date   APPENDECTOMY     BIOPSY  11/18/2020   Procedure: BIOPSY;  Surgeon: Imogene Burn, MD;  Location: [facility] ENDOSCOPY;  Service: Gastroenterology;;   COLONOSCOPY N/A 11/18/2020   Procedure: COLONOSCOPY;  Surgeon: Imogene Burn, MD;  [facility];  Service: Gastroenterology;  Laterality: N/A;   ESOPHAGOGASTRODUODENOSCOPY (EGD) WITH PROPOFOL N/A 11/18/2020   Procedure: ESOPHAGOGASTRODUODENOSCOPY (EGD) WITH PROPOFOL;  Surgeon: Imogene Burn, MD;  Location: [facility] ENDOSCOPY;  Service: Gastroenterology;  Laterality: N/A;   JOINT REPLACEMENT Left 2004   Left hip   POLYPECTOMY  11/18/2020   Procedure: POLYPECTOMY;  Surgeon: Imogene Burn, MD;  Location: [facility] ENDOSCOPY;  Service: Gastroenterology;;   TOTAL HIP ARTHROPLASTY     Past Medical History:   Diagnosis Date   Diabetes mellitus without complication (HCC)    Gait abnormality 03/31/2018   Hemiparesis and speech and language deficit as late effects of stroke (HCC) 03/31/2018   Hypertension    Osteomyelitis of coccyx (HCC) 02/19/2021   Rectal cancer (HCC) 2009   rectal   Sacral osteomyelitis (HCC) 02/19/2021   Seizure (HCC)    Stroke (HCC) 2018   There were no vitals taken for this visit.  Opioid Risk Score:   Fall Risk Score:  `1  Depression screen [facility] 2/9     06/03/2022    9:40 AM 04/17/2022   10:35 AM 04/14/2022    9:57 AM 02/14/2022    9:27 AM 12/11/2021    2:21 PM 05/28/2021    9:55 AM 02/19/2021    8:58 AM  Depression screen PHQ 2/9  Decreased Interest 0 0 0 0 0 0 0  Down, Depressed, Hopeless 0 0 0 0 0 0 0  PHQ - 2 Score 0 0 0 0 0 0 0  Altered sleeping  0    0   Tired, decreased energy  0    0   Change in appetite  0    0   Feeling bad or failure about yourself   0    0   Trouble concentrating  0    0   Moving slowly or fidgety/restless  0    0   Suicidal thoughts  0    0   PHQ-9 Score  0    0      Review of Systems  Musculoskeletal:        LT leg  All other systems reviewed and are negative.      Objective:   Physical Exam  Awake, alert, appropriate, quiet; sitting in manual w/c; NAD LUE - MAS of 2 in shoulder, elbow and wrist/and hand- can uncurl fingers pretty easily Hoffman's LUE MAS of 2 in LLE- in hip and knee- especially with knee extension- it's noted.  Clonus 2-3 beats LLE      Assessment & Plan:   Pt is a 57  yr old male with previous CVA with L hemiplegia- with recent new onset Afib, and worsening spasticity; sacral unstagable ulcer-; gout flare- resolved; Acute on chornic AKI/CKD 3; DM with A1c of 6.7  Here for f/u on CVA.     1.Didn't try Dantrolene yet- also cannot use Zanaflex- tried in inpt- made BP too low.  Cannot use Baclofen because of kidney issues. Lat Cr 4/4 was 1.98- and BUN 21, so don't feel comfortable doing Baclofen right  now- also sedating more than Dantrolene.   2.  Dantrolene- Dantrolene  Side effects- loose stools, sedation, and possible mild weakness  Start 50 mg nightly x 1 week Then 50 mg 2x/day x 1 week Then 50 mg 3x/day or can take 50 mg in Am and 100 mg nightly-  x 1 week Then 100 mg 2x/day - until dose changed by Dr Berline Chough.   Needs CMP/LFTs/labs checked in 1 month, 3 months and every 6 months while on medication    3. Will need to check LFTs/CMP in 1 months- ordered to be done by [facility]- if not done by PCP by then.   4. Will check LFTs at net appt as well.    5. If you have light colored stools or dark colored urine, you let me know ASAP.    6. Wear wrist hand splint nightly- to sleep so don't get contractures of L hand.    7. Cal me or send a my chart message- should be on side of chair- and let me know- and I then can contact the w/c company to get it fitting better. Doesn't remember if numotion or stall's.      8. Latest A1c 6.4- doing well  9. F/U in 3 months-  Has appt with Dr Wynn Banker for Botox- and I will see hm to manage Dantrolene.    I spent a total of 31   minutes on total care today- >50% coordination of care- due to  D/w with pt about dantrolene- side effects, LFT"s need and gradual onset of medicine- also about w/c and review of recent labs and vitals regarding DM and CMP.

## 2022-07-16 NOTE — Patient Instructions (Addendum)
Pt is a 57  yr old male with previous CVA with L hemiplegia- with recent new onset Afib, and worsening spasticity; sacral unstagable ulcer-; gout flare- resolved; Acute on chornic AKI/CKD 3; DM with A1c of 6.7  Here for f/u on CVA.     1.Didn't try Dantrolene yet-  Cannot use Baclofen because of kidney issues. Lat Cr 4/4 was 1.98- and BUN 21, so don't feel comfortable doing Baclofen right now- also sedating more than Dantrolene.   2. Dantrolene- Dantrolene  Side effects- loose stools, sedation, and possible mild weakness  Start 50 mg nightly x 1 week Then 50 mg 2x/day x 1 week Then 50 mg 3x/day or can take 50 mg in Am and 100 mg nightly-  x 1 week Then 100 mg 2x/day - until dose changed by Dr Berline Chough.   Needs CMP/LFTs/labs checked in 1 month, 3 months and every 6 months while on medication  Won't notice a difference until 2-3 weeks.   3. Will need to check LFTs/CMP in 1 months- ordered to be done by [facility]- if not done by PCP by then.   4. Will check LFTs at net appt as well.    5. If you have light colored stools or dark colored urine, you let me know ASAP.    6. Wear wrist hand splint nightly- to sleep so don't get contractures of L hand.    7. Cal me or send a my chart message- should be on side of chair- and let me know- and I then can contact the w/c company to get it fitting better. Doesn't remember if numotion or stall's.      8. Latest A1c 6.4- doing wlel  9. F/U in 3 months-  Has appt with Dr Wynn Banker for Botox- and I will see hm to manage Dantrolene.

## 2022-07-20 ENCOUNTER — Other Ambulatory Visit: Payer: Self-pay | Admitting: Physician Assistant

## 2022-07-20 DIAGNOSIS — K2210 Ulcer of esophagus without bleeding: Secondary | ICD-10-CM

## 2022-07-21 NOTE — Progress Notes (Unsigned)
Subjective:   [name] is a 57 y.o. male who presents for Medicare Annual/Subsequent preventive examination.  Visit Complete: {[phone]}  Patient Medicare AWV questionnaire was completed by the patient on ***; I have confirmed that all information answered by patient is correct and no changes since this date.  Review of Systems    ***       Objective:    There were no vitals filed for this visit. There is no height or weight on file to calculate BMI.     11/26/2020   12:42 PM 11/23/2020    6:13 PM 11/14/2020    5:29 AM 11/13/2020    9:12 PM 07/25/2020   12:17 PM 05/02/2020   10:07 AM 04/21/2019   10:43 AM  Advanced Directives  Does Patient Have a Medical Advance Directive? No No No No No No No  Would patient like information on creating a medical advance directive? Yes (Inpatient - patient defers creating a medical advance directive at this time - Information given) Yes (Inpatient - patient requests chaplain consult to create a medical advance directive) Yes (Inpatient - patient requests chaplain consult to create a medical advance directive) No - Patient declined No - Patient declined No - Patient declined No - Patient declined    Current Medications (verified) Outpatient Encounter Medications as of 07/22/2022  Medication Sig   apixaban (ELIQUIS) 5 MG TABS tablet Take 1 tablet (5 mg total) by mouth 2 (two) times daily.   atorvastatin (LIPITOR) 80 MG tablet TAKE 1 TABLET BY MOUTH EVERY DAY   carvedilol (COREG) 12.5 MG tablet TAKE 1 TABLET (12.5MG  TOTAL) BY MOUTH TWICE A DAY WITH MEALS   dantrolene (DANTRIUM) 50 MG capsule Take 1 capsule (50 mg total) by mouth at bedtime. X 1 week, then 50 mg BID x 1 week, then 50 in AM and 100 mg at bedtime x 1 week, then 100 mg BID- for spasticity- cannot take Baclofen due to renal/CKD.   diltiazem (CARDIZEM) 30 MG tablet TAKE 1 TABLET bid   empagliflozin (JARDIANCE) 10 MG TABS tablet Take 1 tablet (10 mg total) by mouth  daily before breakfast.   Evolocumab (REPATHA SURECLICK) 140 MG/ML SOAJ Inject 140 mg into the skin every 14 (fourteen) days.   ezetimibe (ZETIA) 10 MG tablet Take 1 tablet (10 mg total) by mouth daily.   ferrous sulfate 325 (65 FE) MG tablet 1 tab PO Q Mon/Wed/Fri   glipiZIDE (GLUCOTROL) 5 MG tablet TAKE 1 TABLET BY MOUTH DAILY BEFORE BREAKFAST.   losartan (COZAAR) 25 MG tablet Take 0.5 tablets (12.5 mg total) by mouth daily.   meclizine (ANTIVERT) 12.5 MG tablet TAKE 1-2 TABLETS (12.5-25 MG TOTAL) BY MOUTH 3 (THREE) TIMES DAILY AS NEEDED FOR DIZZINESS.   pantoprazole (PROTONIX) 40 MG tablet Take 1 tablet (40 mg total) by mouth 2 (two) times daily. Patient needs follow up appointment for future refills. Please call [phone] to schedule an appointment.   solifenacin (VESICARE) 10 MG tablet Take 1 tablet (10 mg total) by mouth daily. For bladder issues related to stroke   No facility-administered encounter medications on file as of 07/22/2022.    Allergies (verified) Patient has no known allergies.   History: Past Medical History:  Diagnosis Date   Diabetes mellitus without complication (HCC)    Gait abnormality 03/31/2018   Hemiparesis and speech and language deficit as late effects of stroke (HCC) 03/31/2018   Hypertension    Osteomyelitis of coccyx (HCC) 02/19/2021   Rectal cancer (HCC)  2009   rectal   Sacral osteomyelitis (HCC) 02/19/2021   Seizure (HCC)    Stroke [facility]) 2018   Past Surgical History:  Procedure Laterality Date   APPENDECTOMY     BIOPSY  11/18/2020   Procedure: BIOPSY;  Surgeon: Imogene Burn, MD;  Location: [facility] ENDOSCOPY;  Service: Gastroenterology;;   COLONOSCOPY N/A 11/18/2020   Procedure: COLONOSCOPY;  Surgeon: Imogene Burn, MD;  Location: [facility] ENDOSCOPY;  Service: Gastroenterology;  Laterality: N/A;   ESOPHAGOGASTRODUODENOSCOPY (EGD) WITH PROPOFOL N/A 11/18/2020   Procedure: ESOPHAGOGASTRODUODENOSCOPY (EGD) WITH PROPOFOL;  Surgeon: Imogene Burn, MD;   [facility] END[facility]COPY;  Service: Gastroenterology;  Laterality: N/A;   JOINT REPLACEMENT Left 2004   Left hip   POLYPECTOMY  11/18/2020   Procedure: POLYPECTOMY;  Surgeon: Imogene Burn, MD;  Location: [facility] ENDOSCOPY;  Service: Gastroenterology;;   TOTAL HIP ARTHROPLASTY     Family History  Problem Relation Age of Onset   Hypertension Mother    Heart disease Mother    Hyperlipidemia Mother    Heart failure Mother    Hypertension Sister    Hypertension Brother    Stroke Father    Hypertension Father    Colon cancer Maternal Grandmother    Diabetes Maternal Grandmother    Esophageal cancer Neg Hx    Social History   Socioeconomic History   Marital status: Married    Spouse name: Marcelino Duster   Number of children: 1   Years of education: 16   Highest education level: Not on file  Occupational History   Occupation: disabled  Tobacco Use   Smoking status: Never   Smokeless tobacco: Never  Vaping Use   Vaping Use: Never used  Substance and Sexual Activity   Alcohol use: Not Currently   Drug use: No   Sexual activity: Not Currently  Other Topics Concern   Not on file  Social History Narrative   Patient drinks caffeine a couple times a week.   Patient is right handed.   Lives with wife Marcelino Duster) and daughter   Social Determinants of Health   Financial Resource Strain: Not on file  Food Insecurity: Not on file  Transportation Needs: Not on file  Physical Activity: Not on file  Stress: Not on file  Social Connections: Not on file    Tobacco Counseling Counseling given: Not Answered   Clinical Intake:                        Activities of Daily Living     No data to display          Patient Care Team: Marcine Matar, MD as PCP - General (Internal Medicine) Coletta Memos, MD as Consulting Physician (Neurosurgery)  Indicate any recent Medical Services you may have received from other than [facility] providers in the past year (date may be  approximate).     Assessment:   This is a routine wellness examination for [name].  Hearing/Vision screen No results found.  Dietary issues and exercise activities discussed:     Goals Addressed   None    Depression Screen    07/16/2022    9:35 AM 06/03/2022    9:40 AM 04/17/2022   10:35 AM 04/14/2022    9:57 AM 02/14/2022    9:27 AM 12/11/2021    2:21 PM 05/28/2021    9:55 AM  PHQ 2/9 Scores  PHQ - 2 Score 0 0 0 0 0 0 0  PHQ- 9  Score   0    0    Fall Risk    07/16/2022    9:35 AM 06/03/2022    9:40 AM 04/17/2022   10:27 AM 04/14/2022    9:57 AM 02/14/2022    9:27 AM  Fall Risk   Falls in the past year? 0 Exclusion - non ambulatory 0 0 0  Number falls in past yr: 0  0  0  Injury with Fall? 0  0  0  Risk for fall due to :   No Fall Risks      MEDICARE RISK AT HOME:   TIMED UP AND GO:  Was the test performed?  No    Cognitive Function:    07/25/2020   12:19 PM  MMSE - Mini Mental State Exam  Orientation to time 5  Orientation to Place 5  Registration 3  Attention/ Calculation 5  Recall 3  Language- name 2 objects 2  Language- repeat 1  Language- follow 3 step command 3  Language- read & follow direction 1  Write a sentence 1  Copy design 1  Total score 30        Immunizations Immunization History  Administered Date(s) Administered   Influenza,inj,Quad PF,6+ Mos 11/11/2018, 10/15/2020   PFIZER(Purple Top)SARS-COV-2 Vaccination 04/15/2019, 05/10/2019   Pfizer Covid-19 Vaccine Bivalent Booster 81yrs & up 02/19/2021   Pneumococcal Polysaccharide-23 07/26/2014   Tdap 11/11/2018    TDAP status: Up to date  Pneumococcal vaccine status: Up to date  Covid-19 vaccine status: Information provided on how to obtain vaccines.   Qualifies for Shingles Vaccine? Yes   Zostavax completed No   Shingrix Completed?: No.    Education has been provided regarding the importance of this vaccine. Patient has been advised to call insurance company to determine out of pocket  expense if they have not yet received this vaccine. Advised may also receive vaccine at local pharmacy or Health Dept. Verbalized acceptance and understanding.  Screening Tests Health Maintenance  Topic Date Due   Hepatitis C Screening  Never done   Medicare Annual Wellness (AWV)  07/25/2021   COVID-19 Vaccine (4 - 2023-24 season) 09/13/2021   Zoster Vaccines- Shingrix (1 of 2) 04/17/2023 (Originally 03/05/1984)   INFLUENZA VACCINE  08/14/2022   HEMOGLOBIN A1C  10/17/2022   OPHTHALMOLOGY EXAM  01/10/2023   Diabetic kidney evaluation - eGFR measurement  04/17/2023   Diabetic kidney evaluation - Urine ACR  04/17/2023   FOOT EXAM  04/17/2023   DTaP/Tdap/Td (2 - Td or Tdap) 11/10/2028   Colonoscopy  11/19/2030   HIV Screening  Completed   HPV VACCINES  Aged Out    Health Maintenance  Health Maintenance Due  Topic Date Due   Hepatitis C Screening  Never done   Medicare Annual Wellness (AWV)  07/25/2021   COVID-19 Vaccine (4 - 2023-24 season) 09/13/2021    Colorectal cancer screening: Type of screening: Colonoscopy. Completed 11/28/20. Repeat every 10 years  Lung Cancer Screening: (Low Dose CT Chest recommended if Age 56-80 years, 20 pack-year currently smoking OR have quit w/in 15years.) does not qualify.   Lung Cancer Screening Referral: n/a  Additional Screening:  Hepatitis C Screening: does qualify  Vision Screening: Recommended annual ophthalmology exams for early detection of glaucoma and other disorders of the eye. Is the patient up to date with their annual eye exam?  {YES/NO:21197} Who is the provider or what is the name of the office in which the patient attends annual eye exams? *** If  pt is not established with a provider, would they like to be referred to a provider to establish care? {YES/NO:21197}.   Dental Screening: Recommended annual dental exams for proper oral hygiene  Diabetic Foot Exam: Diabetic Foot Exam: Completed 04/17/22  Community Resource Referral /  Chronic Care Management: CRR required this visit?  {YES/NO:21197}  CCM required this visit?  {CCM Required choices:[phone]}     Plan:     I have personally reviewed and noted the following in the patient's chart:   Medical and social history Use of alcohol, tobacco or illicit drugs  Current medications and supplements including opioid prescriptions. {Opioid [phone]} Functional ability and status Nutritional status Physical activity Advanced directives List of other physicians Hospitalizations, surgeries, and ER visits in previous 12 months Vitals Screenings to include cognitive, depression, and falls Referrals and appointments  In addition, I have reviewed and discussed with patient certain preventive protocols, quality metrics, and best practice recommendations. A written personalized care plan for preventive services as well as general preventive health recommendations were provided to patient.     Kandis Fantasia [place], [place]   02/22/5619   After Visit Summary: {CHL AMB AWV After Visit Summary:[phone]}  Nurse Notes: ***

## 2022-07-21 NOTE — Patient Instructions (Incomplete)
[name] , Thank you for taking time to come for your Medicare Wellness Visit. I appreciate your ongoing commitment to your health goals. Please review the following plan we discussed and let me know if I can assist you in the future.   These are the goals we discussed:  Goals   None     This is a list of the screening recommended for you and due dates:  Health Maintenance  Topic Date Due   Hepatitis C Screening  Never done   Medicare Annual Wellness Visit  07/25/2021   COVID-19 Vaccine (4 - 2023-24 season) 09/13/2021   Zoster (Shingles) Vaccine (1 of 2) 04/17/2023*   Flu Shot  08/14/2022   Hemoglobin A1C  10/17/2022   Eye exam for diabetics  01/10/2023   Yearly kidney function blood test for diabetes  04/17/2023   Yearly kidney health urinalysis for diabetes  04/17/2023   Complete foot exam   04/17/2023   DTaP/Tdap/Td vaccine (2 - Td or Tdap) 11/10/2028   Colon Cancer Screening  11/19/2030   HIV Screening  Completed   HPV Vaccine  Aged Out  *Topic was postponed. The date shown is not the original due date.    Advanced directives: Information on Advanced Care Planning can be found at [facility] Advance Health Care Directives Advance Health Care Directives ([email or web address] Please bring a copy of your health care power of attorney and living will to the office to be added to your chart at your convenience.  Conditions/risks identified: Aim for 30 minutes of exercise or brisk walking, 6-8 glasses of water, and 5 servings of fruits and vegetables each day.  Next appointment: Follow up in one year for your annual wellness visit   Preventive Care 40-64 Years, Male Preventive care refers to lifestyle choices and visits with your health care provider that can promote health and wellness. What does preventive care include? A yearly physical exam. This is also called an annual well check. Dental exams once or twice a year. Routine eye exams. Ask your health care  provider how often you should have your eyes checked. Personal lifestyle choices, including: Daily care of your teeth and gums. Regular physical activity. Eating a healthy diet. Avoiding tobacco and drug use. Limiting alcohol use. Practicing safe sex. Taking low-dose aspirin every day starting at age 77. What happens during an annual well check? The services and screenings done by your health care provider during your annual well check will depend on your age, overall health, lifestyle risk factors, and family history of disease. Counseling  Your health care provider may ask you questions about your: Alcohol use. Tobacco use. Drug use. Emotional well-being. Home and relationship well-being. Sexual activity. Eating habits. Work and work Astronomer. Screening  You may have the following tests or measurements: Height, weight, and BMI. Blood pressure. Lipid and cholesterol levels. These may be checked every 5 years, or more frequently if you are over 49 years old. Skin check. Lung cancer screening. You may have this screening every year starting at age 25 if you have a 30-pack-year history of smoking and currently smoke or have quit within the past 15 years. Fecal occult blood test (FOBT) of the stool. You may have this test every year starting at age 63. Flexible sigmoidoscopy or colonoscopy. You may have a sigmoidoscopy every 5 years or a colonoscopy every 10 years starting at age 81. Prostate cancer screening. Recommendations will vary depending on your family history and other risks. Hepatitis  C blood test. Hepatitis B blood test. Sexually transmitted disease (STD) testing. Diabetes screening. This is done by checking your blood sugar (glucose) after you have not eaten for a while (fasting). You may have this done every 1-3 years. Discuss your test results, treatment options, and if necessary, the need for more tests with your health care provider. Vaccines  Your health care  provider may recommend certain vaccines, such as: Influenza vaccine. This is recommended every year. Tetanus, diphtheria, and acellular pertussis (Tdap, Td) vaccine. You may need a Td booster every 10 years. Zoster vaccine. You may need this after age 22. Pneumococcal 13-valent conjugate (PCV13) vaccine. You may need this if you have certain conditions and have not been vaccinated. Pneumococcal polysaccharide (PPSV23) vaccine. You may need one or two doses if you smoke cigarettes or if you have certain conditions. Talk to your health care provider about which screenings and vaccines you need and how often you need them. This information is not intended to replace advice given to you by your health care provider. Make sure you discuss any questions you have with your health care provider. Document Released: 01/26/2015 Document Revised: 09/19/2015 Document Reviewed: 10/31/2014 Elsevier Interactive Patient Education  2017 [employer].  Fall Prevention in the Home Falls can cause injuries. They can happen to people of all ages. There are many things you can do to make your home safe and to help prevent falls. What can I do on the outside of my home? Regularly fix the edges of walkways and driveways and fix any cracks. Remove anything that might make you trip as you walk through a door, such as a raised step or threshold. Trim any bushes or trees on the path to your home. Use bright outdoor lighting. Clear any walking paths of anything that might make someone trip, such as rocks or tools. Regularly check to see if handrails are loose or broken. Make sure that both sides of any steps have handrails. Any raised decks and porches should have guardrails on the edges. Have any leaves, snow, or ice cleared regularly. Use sand or salt on walking paths during winter. Clean up any spills in your garage right away. This includes oil or grease spills. What can I do in the bathroom? Use night  lights. Install grab bars by the toilet and in the tub and shower. Do not use towel bars as grab bars. Use non-skid mats or decals in the tub or shower. If you need to sit down in the shower, use a plastic, non-slip stool. Keep the floor dry. Clean up any water that spills on the floor as soon as it happens. Remove soap buildup in the tub or shower regularly. Attach bath mats securely with double-sided non-slip rug tape. Do not have throw rugs and other things on the floor that can make you trip. What can I do in the bedroom? Use night lights. Make sure that you have a light by your bed that is easy to reach. Do not use any sheets or blankets that are too big for your bed. They should not hang down onto the floor. Have a firm chair that has side arms. You can use this for support while you get dressed. Do not have throw rugs and other things on the floor that can make you trip. What can I do in the kitchen? Clean up any spills right away. Avoid walking on wet floors. Keep items that you use a lot in easy-to-reach places. If you need  to reach something above you, use a strong step stool that has a grab bar. Keep electrical cords out of the way. Do not use floor polish or wax that makes floors slippery. If you must use wax, use non-skid floor wax. Do not have throw rugs and other things on the floor that can make you trip. What can I do with my stairs? Do not leave any items on the stairs. Make sure that there are handrails on both sides of the stairs and use them. Fix handrails that are broken or loose. Make sure that handrails are as long as the stairways. Check any carpeting to make sure that it is firmly attached to the stairs. Fix any carpet that is loose or worn. Avoid having throw rugs at the top or bottom of the stairs. If you do have throw rugs, attach them to the floor with carpet tape. Make sure that you have a light switch at the top of the stairs and the bottom of the stairs. If  you do not have them, ask someone to add them for you. What else can I do to help prevent falls? Wear shoes that: Do not have high heels. Have rubber bottoms. Are comfortable and fit you well. Are closed at the toe. Do not wear sandals. If you use a stepladder: Make sure that it is fully opened. Do not climb a closed stepladder. Make sure that both sides of the stepladder are locked into place. Ask someone to hold it for you, if possible. Clearly mark and make sure that you can see: Any grab bars or handrails. First and last steps. Where the edge of each step is. Use tools that help you move around (mobility aids) if they are needed. These include: Canes. Walkers. Scooters. Crutches. Turn on the lights when you go into a dark area. Replace any light bulbs as soon as they burn out. Set up your furniture so you have a clear path. Avoid moving your furniture around. If any of your floors are uneven, fix them. If there are any pets around you, be aware of where they are. Review your medicines with your doctor. Some medicines can make you feel dizzy. This can increase your chance of falling. Ask your doctor what other things that you can do to help prevent falls. This information is not intended to replace advice given to you by your health care provider. Make sure you discuss any questions you have with your health care provider. Document Released: 10/26/2008 Document Revised: 06/07/2015 Document Reviewed: 02/03/2014 Elsevier Interactive Patient Education  2017 [employer].

## 2022-07-22 ENCOUNTER — Ambulatory Visit: Payer: Medicare HMO | Attending: Internal Medicine

## 2022-07-22 VITALS — Ht 64.0 in | Wt 200.0 lb

## 2022-07-22 DIAGNOSIS — Z0000 Encounter for general adult medical examination without abnormal findings: Secondary | ICD-10-CM | POA: Diagnosis not present

## 2022-07-24 ENCOUNTER — Other Ambulatory Visit: Payer: Self-pay | Admitting: Internal Medicine

## 2022-07-24 DIAGNOSIS — E1159 Type 2 diabetes mellitus with other circulatory complications: Secondary | ICD-10-CM

## 2022-08-06 ENCOUNTER — Encounter: Payer: Self-pay | Admitting: Internal Medicine

## 2022-08-06 NOTE — Progress Notes (Signed)
I received letter from [facility] informing me that they made several unsuccessful attempts to contact the patient to schedule an appointment so the referral has been closed.  Will address with patient on subsequent visit.

## 2022-08-16 ENCOUNTER — Other Ambulatory Visit: Payer: Self-pay | Admitting: Physician Assistant

## 2022-08-16 DIAGNOSIS — K2210 Ulcer of esophagus without bleeding: Secondary | ICD-10-CM

## 2022-08-17 ENCOUNTER — Other Ambulatory Visit: Payer: Self-pay | Admitting: Physical Medicine and Rehabilitation

## 2022-08-18 ENCOUNTER — Ambulatory Visit: Payer: Self-pay | Admitting: Internal Medicine

## 2022-08-20 ENCOUNTER — Other Ambulatory Visit: Payer: Self-pay | Admitting: Physician Assistant

## 2022-08-20 DIAGNOSIS — K2210 Ulcer of esophagus without bleeding: Secondary | ICD-10-CM

## 2022-08-26 ENCOUNTER — Telehealth: Payer: Self-pay | Admitting: Pharmacist

## 2022-08-26 NOTE — Telephone Encounter (Signed)
LVM to call us back to schedule follow up lipid lab at [address]

## 2022-09-04 ENCOUNTER — Encounter: Payer: No Typology Code available for payment source | Admitting: Physical Medicine & Rehabilitation

## 2022-09-24 ENCOUNTER — Other Ambulatory Visit: Payer: Self-pay | Admitting: Internal Medicine

## 2022-09-24 ENCOUNTER — Other Ambulatory Visit: Payer: Self-pay | Admitting: Physician Assistant

## 2022-09-24 DIAGNOSIS — I480 Paroxysmal atrial fibrillation: Secondary | ICD-10-CM

## 2022-09-24 DIAGNOSIS — K2210 Ulcer of esophagus without bleeding: Secondary | ICD-10-CM

## 2022-09-24 DIAGNOSIS — E1159 Type 2 diabetes mellitus with other circulatory complications: Secondary | ICD-10-CM

## 2022-10-10 ENCOUNTER — Other Ambulatory Visit: Payer: Self-pay | Admitting: Internal Medicine

## 2022-10-12 ENCOUNTER — Other Ambulatory Visit: Payer: Self-pay | Admitting: Internal Medicine

## 2022-10-12 DIAGNOSIS — I152 Hypertension secondary to endocrine disorders: Secondary | ICD-10-CM

## 2022-10-12 DIAGNOSIS — I480 Paroxysmal atrial fibrillation: Secondary | ICD-10-CM

## 2022-10-13 NOTE — Telephone Encounter (Signed)
Last RF 09/24/22 #30 Sent pt message via MyChart message to make appointment  Requested Prescriptions  Refused Prescriptions Disp Refills   diltiazem (CARDIZEM) 30 MG tablet [Pharmacy Med Name: DILTIAZEM 30 MG TABLET] 180 tablet 1    Sig: TAKE 1 TABLET BY MOUTH TWICE A DAY     Cardiovascular: Calcium Channel Blockers 3 Failed - 10/12/2022  2:30 PM      Failed - Cr in normal range and within 360 days    Creat  Date Value Ref Range Status  04/16/2021 1.61 (H) 0.70 - 1.30 mg/dL Final   Creatinine, Ser  Date Value Ref Range Status  04/17/2022 1.98 (H) 0.76 - 1.27 mg/dL Final         Passed - ALT in normal range and within 360 days    ALT  Date Value Ref Range Status  12/24/2021 24 0 - 44 IU/L Final         Passed - AST in normal range and within 360 days    AST  Date Value Ref Range Status  12/24/2021 16 0 - 40 IU/L Final         Passed - Last BP in normal range    BP Readings from Last 1 Encounters:  07/16/22 125/75         Passed - Last Heart Rate in normal range    Pulse Readings from Last 1 Encounters:  07/16/22 92         Passed - Valid encounter within last 6 months    Recent Outpatient Visits           5 months ago Type 2 diabetes mellitus with vascular disease (HCC)   [facility] Marcine Matar, MD   10 months ago Physical deconditioning   [facility] [address], [place], [place]   1 year ago Hemiparesis affecting left side as late effect of cerebrovascular accident (CVA) [facility])   [facility] Marcine Matar, MD   1 year ago Appointment canceled by hospital   [facility] Marcine Matar, MD   1 year ago Appointment canceled by hospital   [facility] Marcine Matar, MD

## 2022-10-14 ENCOUNTER — Encounter: Payer: No Typology Code available for payment source | Admitting: Physical Medicine & Rehabilitation

## 2022-10-20 ENCOUNTER — Encounter: Payer: No Typology Code available for payment source | Admitting: Physical Medicine and Rehabilitation

## 2022-10-27 ENCOUNTER — Other Ambulatory Visit: Payer: Self-pay | Admitting: Internal Medicine

## 2022-10-27 DIAGNOSIS — E1159 Type 2 diabetes mellitus with other circulatory complications: Secondary | ICD-10-CM

## 2022-10-31 ENCOUNTER — Other Ambulatory Visit: Payer: Self-pay | Admitting: Internal Medicine

## 2022-10-31 DIAGNOSIS — E1159 Type 2 diabetes mellitus with other circulatory complications: Secondary | ICD-10-CM

## 2022-10-31 DIAGNOSIS — I480 Paroxysmal atrial fibrillation: Secondary | ICD-10-CM

## 2022-10-31 MED ORDER — EMPAGLIFLOZIN 10 MG PO TABS: 10.0000 mg | ORAL_TABLET | Freq: Every day | ORAL | 0 refills | Status: DC

## 2022-11-08 ENCOUNTER — Other Ambulatory Visit: Payer: Self-pay | Admitting: Internal Medicine

## 2022-11-08 DIAGNOSIS — I480 Paroxysmal atrial fibrillation: Secondary | ICD-10-CM

## 2022-11-08 DIAGNOSIS — E1159 Type 2 diabetes mellitus with other circulatory complications: Secondary | ICD-10-CM

## 2022-11-13 ENCOUNTER — Encounter
Payer: No Typology Code available for payment source | Attending: Physical Medicine & Rehabilitation | Admitting: Physical Medicine & Rehabilitation

## 2022-11-13 ENCOUNTER — Encounter: Payer: Self-pay | Admitting: Physical Medicine & Rehabilitation

## 2022-11-13 VITALS — BP 127/86 | HR 88 | Temp 98.5°F | Ht 64.0 in

## 2022-11-13 DIAGNOSIS — I639 Cerebral infarction, unspecified: Secondary | ICD-10-CM

## 2022-11-13 DIAGNOSIS — G8114 Spastic hemiplegia affecting left nondominant side: Secondary | ICD-10-CM

## 2022-11-13 DIAGNOSIS — I69354 Hemiplegia and hemiparesis following cerebral infarction affecting left non-dominant side: Secondary | ICD-10-CM | POA: Insufficient documentation

## 2022-11-13 MED ORDER — INCOBOTULINUMTOXINA 100 UNITS IM SOLR
300.0000 [IU] | Freq: Once | INTRAMUSCULAR | Status: AC
Start: 2022-11-13 — End: 2022-11-13
  Administered 2022-11-13: 300 [IU] via INTRAMUSCULAR

## 2022-11-13 MED ORDER — SODIUM CHLORIDE (PF) 0.9 % IJ SOLN
6.0000 mL | Freq: Once | INTRAMUSCULAR | Status: AC
Start: 2022-11-13 — End: 2022-11-13
  Administered 2022-11-13: 6 mL via INTRAVENOUS

## 2022-11-13 NOTE — Progress Notes (Signed)
Xeomin Injection for spasticity using needle EMG guidance  Dilution: 50 Units/ml Indication: Severe spasticity which interferes with ADL,mobility and/or  hygiene and is unresponsive to medication management and other conservative care Informed consent was obtained after describing risks and benefits of the procedure with the patient. This includes bleeding, bruising, infection, excessive weakness, or medication side effects. A REMS form is on file and signed.  27g 1" needle electrode Number of units per muscle FCR 50 FPL 25  FDP 75 FDS 75 Pectoralis 75 All injections were done after obtaining appropriate EMG activity and after negative drawback for blood. The patient tolerated the procedure well. Post procedure instructions were given. A followup appointment was made.   If treatment of pectoralis is a goal, would need at least 300U of Xeomin or Botox

## 2022-11-13 NOTE — Patient Instructions (Signed)

## 2022-11-16 ENCOUNTER — Other Ambulatory Visit: Payer: Self-pay | Admitting: Internal Medicine

## 2022-11-16 DIAGNOSIS — E1159 Type 2 diabetes mellitus with other circulatory complications: Secondary | ICD-10-CM

## 2022-11-16 DIAGNOSIS — I480 Paroxysmal atrial fibrillation: Secondary | ICD-10-CM

## 2022-11-19 ENCOUNTER — Other Ambulatory Visit: Payer: Self-pay | Admitting: Internal Medicine

## 2022-11-19 DIAGNOSIS — E1159 Type 2 diabetes mellitus with other circulatory complications: Secondary | ICD-10-CM

## 2022-12-10 ENCOUNTER — Ambulatory Visit: Payer: Medicare HMO | Admitting: Physician Assistant

## 2022-12-26 IMAGING — DX DG FINGER INDEX 2+V*R*
3 series · 3 of 3 positions shown · non-contrast
Comparison: None.

CLINICAL DATA: Right finger pain and swelling

EXAM:
RIGHT INDEX FINGER 2+V

[finger ap]
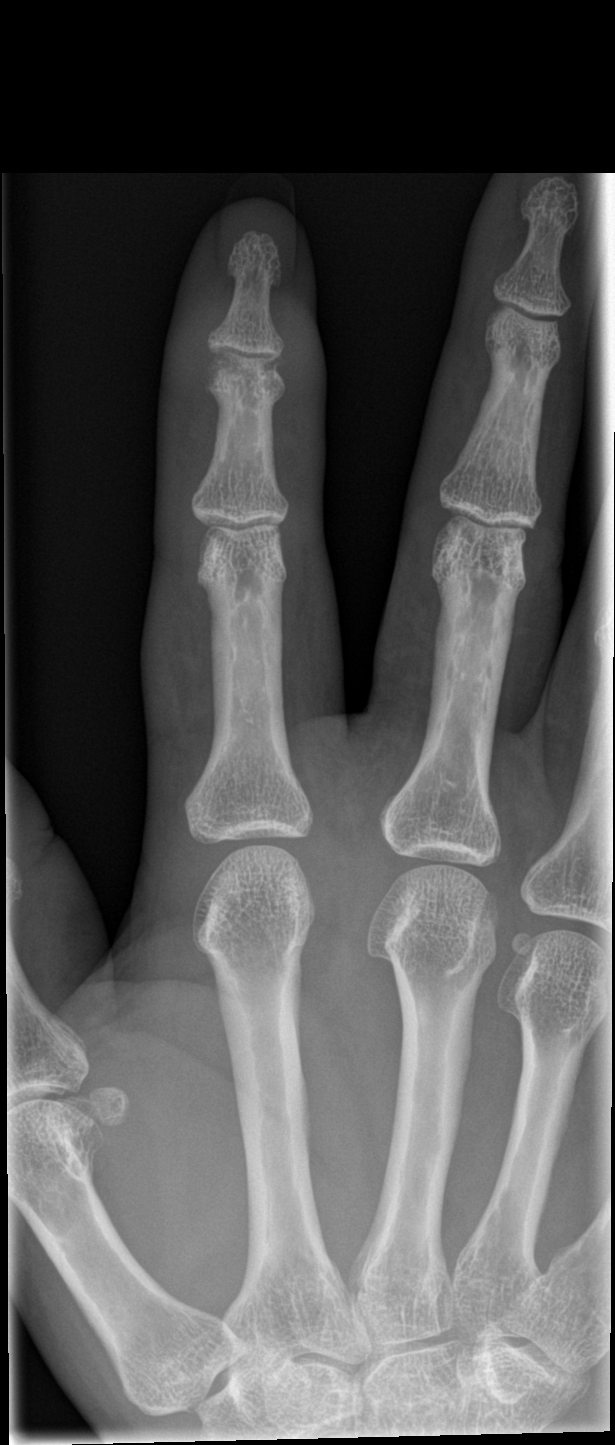

[finger obl]
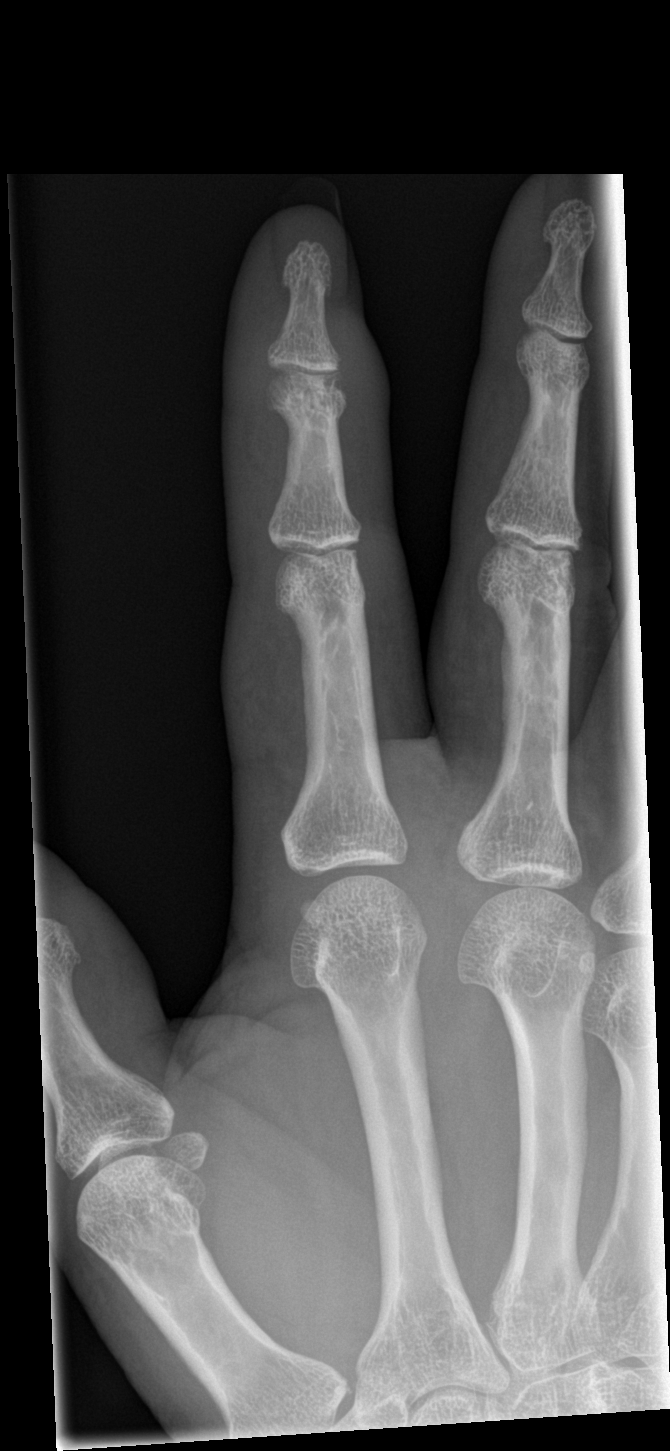

[finger lat]
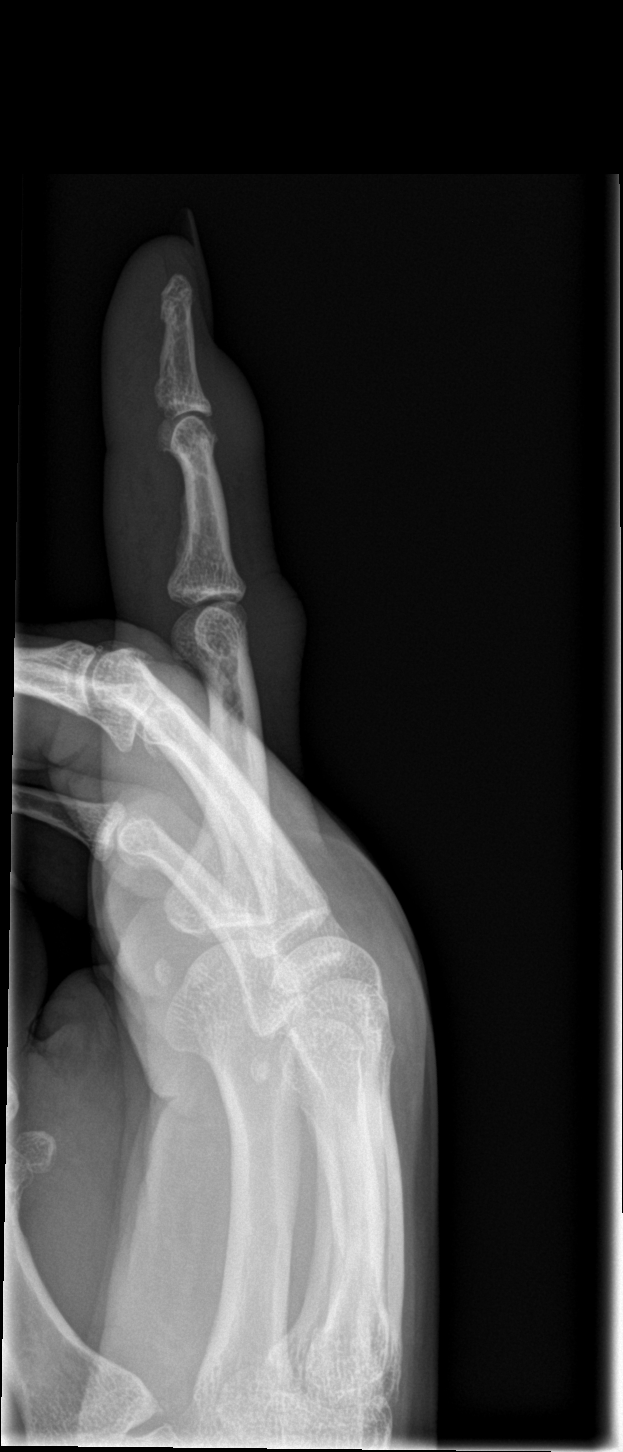

[3 of 3 positions shown; findings below may reference images not displayed]

FINDINGS: Diffuse soft tissue swelling of the right index finger. Possible
nondisplaced fracture at the medial aspect of the head of the middle
phalanx. No other evidence of fracture. No dislocation.
IMPRESSION: Soft tissue swelling of the right index finger with possible
nondisplaced fracture of the medial aspect of the head of the middle
phalanx.

## 2022-12-29 NOTE — Progress Notes (Unsigned)
Established Patient Office Visit  Subjective   Patient ID: [name], male    DOB: [birth year]  Age: 57 y.o. MRN: [id number]  No chief complaint on file.   57 y.o.M pcp [facility]  Last ov 04/2022: Expand All Collapse All      Patient ID: [name], male    DOB: Mar 16, 1965  MRN: [id number]   CC: Follow-up (Follow up /No to shingles vax. )     Subjective: [name] is a 57 y.o. male who presents for chronic ds management.  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    Did get mobility chair since last visit with me 05/2021.  He finds it helpful. Saw PMR Dr. Berline Chough 3 days ago.    Started on Botox inj to LUE.  Helps a little Referred to urologist for incontinence.  Told to hold Gemtesa and Try Vesicare 10 mg.  Pt has not picked up as yet.  We agreed to take the Gemtesa off the list to avoid confusion Told Dr. Luz Lex that he still has sacral ulcer x 1 wk.  His wife is not aware of this and does not think that he has 1.   DM:      Results for orders placed or performed in visit on 04/17/22 POCT glucose (manual entry) Result Value Ref Range   POC Glucose 141 (A) 70 - 99 mg/dl POCT glycosylated hemoglobin (Hb A1C) Result Value Ref Range   Hemoglobin A1C       HbA1c POC (<> result, manual entry)       HbA1c, POC (prediabetic range)       HbA1c, POC (controlled diabetic range) 6.2 0.0 - 7.0 % Compliant with meds - Glucotrol 5 mg and Jardiance 10 mg daily.  Started on Jardiance by cardiology Dr. Rise Paganini on last visit in December for renal protection and his history of diastolic dysfunction Does not check BS Does okay with eating habits. Snacks on fruits and veggies   HTN/CKD 3: compliant with Coreg 12.5 mg and Cardizem 30 mg BID  for both, Cozaar 25 mg 1/2 daily.  Out of Cardizem x 1 wk Limits salt in foods Kidney function had declined some from when I last saw him.  GFR has ranged from 50-36 with most recent being 40.  Creatinine ranged from 1.61-2.12 with most recent being 1.95   Aortic atherosclerosis/A.fib:  started on Repatha last mth by cardiology.  Also on Lipitor 80 mg -on Eliquis by cardiologist in lieu of aspirin; no bruising or bleeding, no palpitations.  He has not had any falls.   Anemia:  taking iron supplement 3x/wk. has history of rectal cancer.  Last colonoscopy was 11/2020 that revealed some diverticulosis, also ascending hepatic flexure and 1 tubular adenoma polyp removed from the rectal area.   HM:  decline shingles vaccine.  Due for Medicare wellness visit.       Patient Active Problem List   Diagnosis Date Noted  Aortic atherosclerosis 04/17/2022  Stage 3b chronic kidney disease 04/17/2022  CKD (chronic kidney disease) stage 4, GFR 15-29 ml/min 04/14/2022  Spastic hemiparesis of left nondominant side due to acute cerebral infarction 04/14/2022  Hyperlipidemia 02/12/2022  Chronic pain syndrome 01/02/2021  Spasticity as late effect of cerebrovascular accident (CVA) 01/02/2021  Esophageal ulcer without bleeding 12/25/2020  Sacral decubitus ulcer, stage IV 12/25/2020  Adenomatous polyp of colon 12/25/2020  CKD (chronic kidney disease) stage 2, GFR 60-89 ml/min 12/25/2020  Sacral decubitus ulcer 12/17/2020  Hyponatremia 12/17/2020  Acute gastric ulcer    Controlled type 2 diabetes mellitus with hyperglycemia, without long-term current use of insulin    New onset atrial fibrillation    Pressure injury of skin 11/26/2020  Debility 11/23/2020  History of colonic polyps    Nausea and vomiting    AKI (acute kidney injury) 11/14/2020  Atrial fibrillation with RVR 11/14/2020  History of CVA with residual deficit 11/14/2020  Gout 11/14/2020  Microcytic hypochromic  anemia 11/14/2020  History of meningioma 11/14/2020  Obesity (BMI 30.0-34.9) 11/14/2020  Pain due to onychomycosis of toenails of both feet 09/21/2020  Edema of both legs 09/29/2019  Meningioma 04/13/2019  Hemiparesis and speech and language deficit as late effects of stroke 03/31/2018  Gait abnormality 03/31/2018  Hemiplegia of left nondominant side as late effect of cerebral infarction 12/18/2017  Subacromial impingement of left shoulder 06/09/2017  Spastic neurogenic bladder 06/09/2017  Dysarthria as late effect of stroke 04/01/2017  Neurogenic bladder due to old stroke 12/25/2016  Right pontine cerebrovascular accident 12/18/2016  Left hemiparesis    Diastolic dysfunction    Dysphagia, post-stroke    Acute ischemic stroke 12/12/2016  Seizures 07/22/2014  Hypertension 07/22/2014  Type 2 diabetes mellitus with vascular disease 07/22/2014  Brain mass    Malignant neoplasm of rectum 09/02/2007      Medications Ordered Prior to Encounter      Current Outpatient Medications on File Prior to Visit Medication Sig Dispense Refill  apixaban (ELIQUIS) 5 MG TABS tablet Take 1 tablet (5 mg total) by mouth 2 (two) times daily. 180 tablet 3  atorvastatin (LIPITOR) 80 MG tablet Take 1 tablet (80 mg total) by mouth daily. 90 tablet 1  carvedilol (COREG) 12.5 MG tablet TAKE 1 TABLET (12.5MG  TOTAL) BY MOUTH TWICE A DAY WITH MEALS 180 tablet 1  empagliflozin (JARDIANCE) 10 MG TABS tablet Take 1 tablet (10 mg total) by mouth daily before breakfast. 90 tablet 3  Evolocumab (REPATHA SURECLICK) 140 MG/ML SOAJ Inject 140 mg into the skin every 14 (fourteen) days. 6 mL 3  ferrous sulfate 325 (65 FE) MG tablet 1 tab PO Q Mon/Wed/Fri 100 tablet 1  glipiZIDE (GLUCOTROL) 5 MG tablet Take 1 tablet (5 mg total) by mouth daily before breakfast. 90 tablet 1  losartan (COZAAR) 25 MG tablet Take 0.5 tablets (12.5 mg total) by mouth daily. 45 tablet 3  pantoprazole (PROTONIX) 40  MG tablet TAKE 1 TABLET BY MOUTH TWICE A DAY 180 tablet 0  solifenacin (VESICARE) 10 MG tablet Take 1 tablet (10 mg total) by mouth daily. For bladder issues related to stroke 30 tablet 5  meclizine (ANTIVERT) 12.5 MG tablet TAKE 1-2 TABLETS (12.5-25 MG TOTAL) BY MOUTH 3 (THREE) TIMES DAILY AS NEEDED FOR DIZZINESS. (Patient not taking: Reported on 04/17/2022) 60 tablet 1    No current facility-administered medications on file prior to visit.       Allergies No Known Allergies     Social History        Socioeconomic History  Marital status: Married     Spouse name: Marcelino Duster  Number of children: 1  Years of education: 16  Highest education level: Not on file Occupational History  Occupation: disabled Tobacco Use  Smoking status: Never  Smokeless tobacco: Never Vaping Use  Vaping Use: Never used  Substance and Sexual Activity  Alcohol use: Not Currently  Drug use: No  Sexual activity: Not Currently Other Topics Concern  Not on file Social History Narrative   Patient drinks caffeine a couple times a week.   Patient is right handed.   Lives with wife Marcelino Duster) and daughter    Social Determinants of Health    Financial Resource Strain: Not on file Food Insecurity: Not on file Transportation Needs: Not on file Physical Activity: Not on file Stress: Not on file Social Connections: Not on file Intimate Partner Violence: Not on file          Family History Problem Relation Age of Onset  Hypertension Mother    Heart disease Mother    Hyperlipidemia Mother    Heart failure Mother    Hypertension Sister    Hypertension Brother    Stroke Father    Hypertension Father    Colon cancer Maternal Grandmother    Diabetes Maternal Grandmother    Esophageal cancer Neg Hx              Past Surgical History: Procedure Laterality Date  APPENDECTOMY      BIOPSY   11/18/2020   Procedure: BIOPSY;  Surgeon: Imogene Burn, MD;  Location: [facility]  ENDOSCOPY;  Service: Gastroenterology;;  COLONOSCOPY N/A 11/18/2020   Procedure: COLONOSCOPY;  Surgeon: Imogene Burn, MD;  Location: [facility] ENDOSCOPY;  Service: Gastroenterology;  Laterality: N/A;  ESOPHAGOGASTRODUODENOSCOPY (EGD) WITH PROPOFOL N/A 11/18/2020   Procedure: ESOPHAGOGASTRODUODENOSCOPY (EGD) WITH PROPOFOL;  Surgeon: Imogene Burn, MD;  Location: [facility] ENDOSCOPY;  Service: Gastroenterology;  Laterality: N/A;  JOINT REPLACEMENT Left 2004   Left hip  POLYPECTOMY   11/18/2020   Procedure: POLYPECTOMY;  Surgeon: Imogene Burn, MD;  Location: [facility] ENDOSCOPY;  Service: Gastroenterology;;  TOTAL HIP ARTHROPLASTY           ROS: Review of Systems Negative except as stated above   PHYSICAL EXAM: BP (!) 147/95 (BP Location: Right Arm, Patient Position: Sitting, Cuff Size: Large)   Pulse 83   Temp 98.1 F (36.7 C) (Oral)   Ht 5\' 4"  (1.626 m)   Wt 200 lb (90.7 kg)   SpO2 97%   BMI 34.33 kg/m      Wt Readings from Last 3 Encounters: 04/17/22 200 lb (90.7 kg) 04/14/22 220 lb (99.8 kg) 12/16/21 215 lb (97.5 kg) Repeat blood pressure 148/93   Physical Exam     General appearance - alert, well appearing, and in no distress.  Patient sitting in manual wheelchair. Mental status -patient is alert.  He answers questions appropriately. Mouth -oral mucosa is moist. Neck - supple, no significant adenopathy Chest - clear to auscultation, no wheezes, rales or rhonchi, symmetric air entry Heart -sounds to be in sinus rhythm. Extremities -no lower extremity edema.  Diabetic Foot Exam - Simple  Simple Foot Form  Diabetic Foot exam was performed with the following findings: Yes 04/17/2022 11:43 AM Visual Inspection See comments: Yes Sensation Testing Intact to touch and monofilament testing bilaterally: Yes Pulse Check Posterior Tibialis and Dorsalis pulse intact bilaterally: Yes Comments Toe nails thick and discolored and overgrown.  Flat footed.  Skin on dorsum and plantar  surfaces dry and flacking.  Wife declines referral to podiatry for nail clipping.  She will do it.             04/17/2022   10:35 AM 04/14/2022    9:57 AM 02/14/2022    9:27 AM Depression screen PHQ 2/9  Decreased Interest 0 0 0 Down, Depressed, Hopeless 0 0 0 PHQ - 2 Score 0 0 0 Altered sleeping 0     Tired, decreased energy 0     Change in appetite 0     Feeling bad or failure about yourself  0     Trouble concentrating 0     Moving slowly or fidgety/restless 0     Suicidal thoughts 0     PHQ-9 Score 0              Latest Ref Rng & Units 01/02/2022   12:36 PM 12/24/2021    9:12 AM 12/11/2021    2:46 PM CMP Glucose 70 - 99 mg/dL 78  962  952  BUN 6 - 24 mg/dL 20  31  20   Creatinine 0.76 - 1.27 mg/dL 8.41  3.24  4.01  Sodium 134 - 144 mmol/L 143  145  146  Potassium 3.5 - 5.2 mmol/L 4.7  4.1  4.5  Chloride 96 - 106 mmol/L 107  108  108  CO2 20 - 29 mmol/L 22  23  23   Calcium 8.7 - 10.2 mg/dL 9.5  9.4  9.1  Total Protein 6.0 - 8.5 g/dL   7.0  7.1  Total Bilirubin 0.0 - 1.2 mg/dL   0.3  0.4  Alkaline Phos 44 - 121 IU/L   99  97  AST 0 - 40 IU/L   16  14  ALT 0 - 44 IU/L   24  15    Lipid Panel   Labs (Brief)       Component Value Date/Time   CHOL 177 12/24/2021 0912   TRIG 153 (H) 12/24/2021 0912   HDL 49 12/24/2021 0912   CHOLHDL 3.6 12/24/2021 0912   CHOLHDL 3.9 12/13/2016 0421   VLDL 15 12/13/2016 0421   LDLCALC 101 (H) 12/24/2021 0912      CBC  Labs (Brief)       Component Value Date/Time   WBC 4.0 12/11/2021 1446   WBC 4.5 04/16/2021 1017   RBC 4.51 12/11/2021 1446   RBC 4.51 04/16/2021 1017   HGB 12.2 (L) 12/11/2021 1446   HGB 14.2 08/02/2008 1026   HCT 37.1 (L) 12/11/2021 1446   HCT 41.6 08/02/2008 1026   PLT 220 12/11/2021 1446   MCV 82 12/11/2021 1446   MCV 78.4 (L) 08/02/2008 1026   MCH 27.1 12/11/2021 1446   MCH 26.4 (L) 04/16/2021 1017   MCHC 32.9 12/11/2021 1446   MCHC 33.5 04/16/2021 1017   RDW 14.4 12/11/2021 1446   RDW 16.3  (H) 08/02/2008 1026   LYMPHSABS 1.2 12/11/2021 1446   LYMPHSABS 0.6 (L) 08/02/2008 1026   MONOABS 0.4 02/07/2021 0943   MONOABS 0.2 08/02/2008 1026   EOSABS 0.1 12/11/2021 1446   BASOSABS 0.0 12/11/2021 1446   BASOSABS 0.0 08/02/2008 1026      ASSESSMENT AND PLAN: 1. Type 2 diabetes mellitus with vascular disease At goal. Encourage him to continue healthy eating habits.  Continue Jardiance. - POCT glucose (manual entry) - POCT glycosylated hemoglobin (Hb A1C) - Microalbumin / creatinine urine ratio   2. Hypertension associated with type 2 diabetes mellitus Not at goal but he has been out of the Cardizem for 1 week.  He will continue current dose of Cozaar 12.5 mg daily, Cardizem 30 mg twice a day, carvedilol 12.5 mg twice a day Refill sent on Cardizem.   3. PAF (paroxysmal atrial fibrillation) Now on Eliquis.  Discussed the  importance of trying to avoid falls as much as possible while on blood thinners.   4. Diastolic dysfunction Continue Jardiance   5. Aortic atherosclerosis On Repatha and atorvastatin through his cardiologist   6. Stage 3b chronic kidney disease Continue to monitor.  Advised to avoid NSAIDs. - Basic metabolic panel - Microalbumin / creatinine urine ratio - Ambulatory referral to Nephrology   7. Chronic anemia H&H had improved when last checked.  Recheck CBC today. - CBC   8. History of rectal cancer Up-to-date with colonoscopy.  He has not had any symptoms of rectal bleeding   9. Hemiparesis affecting left side as late effect of cerebrovascular accident (CVA) Stable.  Continue atorvastatin.  On Eliquis in lieu of aspirin. Patient does not have a transfer belt on and we do not have the manpower to lift him up from his manual wheelchair to check the sacral area.  Wife states that she will do so this evening and will call me and let me know if he does have a sacral ulcer so that we can refer to wound care.            {History  (Optional):23778}  ROS    Objective:     There were no vitals taken for this visit. {Vitals History (Optional):23777}  Physical Exam   No results found for any visits on 12/31/22.  {Labs (Optional):23779}  The ASCVD Risk score (Arnett DK, et al., 2019) failed to calculate for the following reasons:   Risk score cannot be calculated because patient has a medical history suggesting prior/existing ASCVD    Assessment & Plan:   Problem List Items Addressed This Visit   None   No follow-ups on file.    Shan Levans, MD

## 2022-12-30 ENCOUNTER — Encounter: Payer: Self-pay | Admitting: Physical Medicine & Rehabilitation

## 2022-12-30 ENCOUNTER — Encounter
Payer: No Typology Code available for payment source | Attending: Physical Medicine & Rehabilitation | Admitting: Physical Medicine & Rehabilitation

## 2022-12-30 VITALS — BP 127/86 | HR 101 | Wt 200.0 lb

## 2022-12-30 DIAGNOSIS — R252 Cramp and spasm: Secondary | ICD-10-CM | POA: Diagnosis not present

## 2022-12-30 DIAGNOSIS — Z993 Dependence on wheelchair: Secondary | ICD-10-CM | POA: Diagnosis not present

## 2022-12-30 DIAGNOSIS — I69354 Hemiplegia and hemiparesis following cerebral infarction affecting left non-dominant side: Secondary | ICD-10-CM | POA: Insufficient documentation

## 2022-12-30 DIAGNOSIS — E66811 Obesity, class 1: Secondary | ICD-10-CM | POA: Diagnosis not present

## 2022-12-30 DIAGNOSIS — I69398 Other sequelae of cerebral infarction: Secondary | ICD-10-CM | POA: Diagnosis not present

## 2022-12-30 NOTE — Progress Notes (Signed)
Subjective:    Patient ID: [name], male    DOB: 25-Feb-1965, 57 y.o.   MRN: [id number]  HPI 57 yo male with chronic Left spastic hemiparesis due to CVA which has caused difficulty with Left hand hygiene.  Pt returns with wife , who drives him to appts.  She states that his hand hygiene is easier now.   She is asking for stretching exercises  Xeomin  FCR 50 FPL 25   FDP 75 FDS 75 Pectoralis 75 Pain Inventory Average Pain 0 Pain Right Now 0 My pain is  no pain  In the last 24 hours, has pain interfered with the following? General activity no pain Relation with others no pain Enjoyment of life no pain What TIME of day is your pain at its worst? no pain Sleep (in general) Good  Pain is worse with: no pain Pain improves with: no pain Relief from Meds: no pain  Family History  Problem Relation Age of Onset   Hypertension Mother    Heart disease Mother    Hyperlipidemia Mother    Heart failure Mother    Hypertension Sister    Hypertension Brother    Stroke Father    Hypertension Father    Colon cancer Maternal Grandmother    Diabetes Maternal Grandmother    Esophageal cancer Neg Hx    Social History   Socioeconomic History   Marital status: Married    Spouse name: Marcelino Duster   Number of children: 1   Years of education: 16   Highest education level: Not on file  Occupational History   Occupation: disabled  Tobacco Use   Smoking status: Never   Smokeless tobacco: Never  Vaping Use   Vaping status: Never Used  Substance and Sexual Activity   Alcohol use: Not Currently   Drug use: No   Sexual activity: Not Currently  Other Topics Concern   Not on file  Social History Narrative   Patient drinks caffeine a couple times a week.   Patient is right handed.   Lives with wife Marcelino Duster) and daughter   Social Drivers of Health   Financial Resource Strain: Low Risk  (07/22/2022)   Overall Financial Resource Strain (CARDIA)    Difficulty of Paying Living  Expenses: Not hard at all  Food Insecurity: No Food Insecurity (07/22/2022)   Hunger Vital Sign    Worried About Running Out of Food in the Last Year: Never true    Ran Out of Food in the Last Year: Never true  Transportation Needs: No Transportation Needs (07/22/2022)   PRAPARE - Administrator, Civil Service (Medical): No    Lack of Transportation (Non-Medical): No  Physical Activity: Inactive (07/22/2022)   Exercise Vital Sign    Days of Exercise per Week: 0 days    Minutes of Exercise per Session: 0 min  Stress: No Stress Concern Present (07/22/2022)   [employer] of Occupational Health - Occupational Stress Questionnaire    Feeling of Stress : Not at all  Social Connections: Moderately Integrated (07/22/2022)   Social Connection and Isolation Panel [NHANES]    Frequency of Communication with Friends and Family: More than three times a week    Frequency of Social Gatherings with Friends and Family: Three times a week    Attends Religious Services: More than 4 times per year    Active Member of Clubs or Organizations: No    Attends Banker Meetings: Never    Marital  Status: Married   Past Surgical History:  Procedure Laterality Date   APPENDECTOMY     BIOPSY  11/18/2020   Procedure: BIOPSY;  Surgeon: Imogene Burn, MD;  Location: [facility] ENDOSCOPY;  Service: Gastroenterology;;   COLONOSCOPY N/A 11/18/2020   Procedure: COLONOSCOPY;  Surgeon: Imogene Burn, MD;  Location: [facility] ENDOSCOPY;  Service: Gastroenterology;  Laterality: N/A;   ESOPHAGOGASTRODUODENOSCOPY (EGD) WITH PROPOFOL N/A 11/18/2020   Procedure: ESOPHAGOGASTRODUODENOSCOPY (EGD) WITH PROPOFOL;  Surgeon: Imogene Burn, MD;  Location[facility] ENDOSCOPY;  Service: Gastroenterology;  Laterality: N/A;   JOINT REPLACEMENT Left 2004   Left hip   POLYPECTOMY  11/18/2020   Procedure: POLYPECTOMY;  Surgeon: Imogene Burn, MD;  Location: [facility] ENDOSCOPY;  Service: Gastroenterology;;   TOTAL HIP ARTHROPLASTY     Past  Surgical History:  Procedure Laterality Date   APPENDECTOMY     BIOPSY  11/18/2020   Procedure: BIOPSY;  Surgeon: Imogene Burn, MD;  Location: [facility] ENDOSCOPY;  Service: Gastroenterology;;   COLONOSCOPY N/A 11/18/2020   Procedure: COLONOSCOPY;  Surgeon: Imogene Burn, MD;  Location: [facility] ENDOSCOPY;  Service: Gastroenterology;  Laterality: N/A;   ESOPHAGOGASTRODUODENOSCOPY (EGD) WITH PROPOFOL N/A 11/18/2020   Procedure: ESOPHAGOGASTRODUODENOSCOPY (EGD) WITH PROPOFOL;  Surgeon: Imogene Burn, MD;  Location: [facility] ENDOSCOPY;  Service: Gastroenterology;  Laterality: N/A;   JOINT REPLACEMENT Left 2004   Left hip   POLYPECTOMY  11/18/2020   Procedure: POLYPECTOMY;  Surgeon: Imogene Burn, MD;  Location: [facility] ENDOSCOPY;  Service: Gastroenterology;;   TOTAL HIP ARTHROPLASTY     Past Medical History:  Diagnosis Date   Diabetes mellitus without complication (HCC)    Gait abnormality 03/31/2018   Hemiparesis and speech and language deficit as late effects of stroke (HCC) 03/31/2018   Hypertension    Osteomyelitis of coccyx (HCC) 02/19/2021   Rectal cancer (HCC) 2009   rectal   Sacral osteomyelitis (HCC) 02/19/2021   Seizure (HCC)    Stroke (HCC) 2018   BP 127/86   Pulse (!) 101   Wt 200 lb (90.7 kg) Comment: last reported  SpO2 94%   BMI 34.33 kg/m   Opioid Risk Score:   Fall Risk Score:  `1  Depression screen [facility] 2/9     12/30/2022   11:45 AM 11/13/2022   12:30 PM 07/22/2022   10:44 AM 07/16/2022    9:35 AM 06/03/2022    9:40 AM 04/17/2022   10:35 AM 04/14/2022    9:57 AM  Depression screen PHQ 2/9  Decreased Interest 0 0 0 0 0 0 0  Down, Depressed, Hopeless 0 0 0 0 0 0 0  PHQ - 2 Score 0 0 0 0 0 0 0  Altered sleeping      0   Tired, decreased energy      0   Change in appetite      0   Feeling bad or failure about yourself       0   Trouble concentrating      0   Moving slowly or fidgety/restless      0   Suicidal thoughts      0   PHQ-9 Score      0      Review of Systems   Constitutional: Negative.   HENT: Negative.    Eyes: Negative.   Respiratory: Negative.    Cardiovascular: Negative.   Gastrointestinal: Negative.   Endocrine: Negative.   Genitourinary: Negative.   Musculoskeletal: Negative.   Skin: Negative.  Allergic/Immunologic: Negative.   Neurological: Negative.   Hematological: Negative.   Psychiatric/Behavioral: Negative.    All other systems reviewed and are negative.      Objective:   Physical Exam Motor strength is 2 - at the left shoulder and bicep 0 at the tricep 0 at the finger flexors and extensors. Tone MAS 2 at the elbow flexors MAS 1 at the wrist flexors MAS 2 at the finger flexors MAS 1 at the thumb flexor. MAS 3 at the shoulder General no acute distress Mood and affect appropriate Skin has callus formation and dead skin on the palmar and dorsal aspect of the hand and fingers.      Assessment & Plan:  1.  Left spastic hemiparesis improved after botulinum toxin injection, tone is adequate now to perform hand hygiene encouraged wife to use washcloth to remove dead skin. Continue with current dose of Botox Have instructed with shoulder stretching as well as wrist and finger stretching exercises Recommend resting hand splint use at night Follow-up with Dr. Berline Chough for medication management and general physiatry care

## 2022-12-31 ENCOUNTER — Ambulatory Visit: Payer: Medicare HMO | Attending: Critical Care Medicine | Admitting: Critical Care Medicine

## 2022-12-31 ENCOUNTER — Encounter: Payer: Self-pay | Admitting: Critical Care Medicine

## 2022-12-31 VITALS — BP 132/86 | HR 99 | Temp 98.5°F

## 2022-12-31 DIAGNOSIS — E1169 Type 2 diabetes mellitus with other specified complication: Secondary | ICD-10-CM

## 2022-12-31 DIAGNOSIS — N1832 Chronic kidney disease, stage 3b: Secondary | ICD-10-CM

## 2022-12-31 DIAGNOSIS — I152 Hypertension secondary to endocrine disorders: Secondary | ICD-10-CM | POA: Diagnosis not present

## 2022-12-31 DIAGNOSIS — Z1159 Encounter for screening for other viral diseases: Secondary | ICD-10-CM

## 2022-12-31 DIAGNOSIS — I10 Essential (primary) hypertension: Secondary | ICD-10-CM

## 2022-12-31 DIAGNOSIS — I6930 Unspecified sequelae of cerebral infarction: Secondary | ICD-10-CM

## 2022-12-31 DIAGNOSIS — I6350 Cerebral infarction due to unspecified occlusion or stenosis of unspecified cerebral artery: Secondary | ICD-10-CM

## 2022-12-31 DIAGNOSIS — I4891 Unspecified atrial fibrillation: Secondary | ICD-10-CM

## 2022-12-31 DIAGNOSIS — Z7984 Long term (current) use of oral hypoglycemic drugs: Secondary | ICD-10-CM

## 2022-12-31 DIAGNOSIS — I480 Paroxysmal atrial fibrillation: Secondary | ICD-10-CM

## 2022-12-31 DIAGNOSIS — E1165 Type 2 diabetes mellitus with hyperglycemia: Secondary | ICD-10-CM

## 2022-12-31 DIAGNOSIS — E1159 Type 2 diabetes mellitus with other circulatory complications: Secondary | ICD-10-CM

## 2022-12-31 DIAGNOSIS — I700 Atherosclerosis of aorta: Secondary | ICD-10-CM

## 2022-12-31 DIAGNOSIS — K2210 Ulcer of esophagus without bleeding: Secondary | ICD-10-CM

## 2022-12-31 DIAGNOSIS — I69354 Hemiplegia and hemiparesis following cerebral infarction affecting left non-dominant side: Secondary | ICD-10-CM

## 2022-12-31 DIAGNOSIS — Z23 Encounter for immunization: Secondary | ICD-10-CM

## 2022-12-31 DIAGNOSIS — E785 Hyperlipidemia, unspecified: Secondary | ICD-10-CM

## 2022-12-31 DIAGNOSIS — I5189 Other ill-defined heart diseases: Secondary | ICD-10-CM | POA: Diagnosis not present

## 2022-12-31 DIAGNOSIS — E119 Type 2 diabetes mellitus without complications: Secondary | ICD-10-CM

## 2022-12-31 LAB — GLUCOSE, POCT (MANUAL RESULT ENTRY): POC Glucose: 136 mg/dL — AB (ref 70–99)

## 2022-12-31 LAB — POCT GLYCOSYLATED HEMOGLOBIN (HGB A1C): HbA1c, POC (controlled diabetic range): 6.7 % (ref 0.0–7.0)

## 2022-12-31 MED ORDER — EMPAGLIFLOZIN 10 MG PO TABS
10.0000 mg | ORAL_TABLET | Freq: Every day | ORAL | 2 refills | Status: AC
Start: 2022-12-31 — End: ?

## 2022-12-31 MED ORDER — GLIPIZIDE 5 MG PO TABS: 5.0000 mg | ORAL_TABLET | Freq: Every day | ORAL | 1 refills | Status: DC

## 2022-12-31 MED ORDER — DILTIAZEM HCL 30 MG PO TABS: ORAL_TABLET | ORAL | 1 refills | Status: DC

## 2022-12-31 MED ORDER — SOLIFENACIN SUCCINATE 10 MG PO TABS: 10.0000 mg | ORAL_TABLET | Freq: Every day | ORAL | 1 refills | Status: DC

## 2022-12-31 MED ORDER — CARVEDILOL 12.5 MG PO TABS: ORAL_TABLET | ORAL | 1 refills | Status: DC

## 2022-12-31 MED ORDER — ATORVASTATIN CALCIUM 80 MG PO TABS: 80.0000 mg | ORAL_TABLET | Freq: Every day | ORAL | 2 refills | Status: AC

## 2022-12-31 MED ORDER — EZETIMIBE 10 MG PO TABS: 10.0000 mg | ORAL_TABLET | Freq: Every day | ORAL | 3 refills | Status: AC

## 2022-12-31 MED ORDER — APIXABAN 5 MG PO TABS: 5.0000 mg | ORAL_TABLET | Freq: Two times a day (BID) | ORAL | 3 refills | Status: DC

## 2022-12-31 MED ORDER — PANTOPRAZOLE SODIUM 40 MG PO TBEC: 40.0000 mg | DELAYED_RELEASE_TABLET | Freq: Every day | ORAL | 2 refills | Status: DC

## 2022-12-31 NOTE — Patient Instructions (Signed)
Labs today Cardiology referral made All medications refilled  Flu vax given Return 6 months Dr Laural Benes Check blood sugar daily before lunch, call us if less than 80

## 2022-12-31 NOTE — Assessment & Plan Note (Signed)
Continue with rehab follow-up

## 2022-12-31 NOTE — Assessment & Plan Note (Signed)
Follow-up per rehab

## 2022-12-31 NOTE — Assessment & Plan Note (Addendum)
Heart rate currently controlled on diltiazem maintains anticoagulation as well Refer back to cardiology as he has been lost to follow-up

## 2022-12-31 NOTE — Assessment & Plan Note (Signed)
Continue with glipizide and Jardiance check labs

## 2022-12-31 NOTE — Assessment & Plan Note (Signed)
Hypertension well-controlled will make no changes at this time

## 2022-12-31 NOTE — Assessment & Plan Note (Signed)
Reassess labs 

## 2022-12-31 NOTE — Assessment & Plan Note (Signed)
Has resolved continue proton pump inhibitor

## 2022-12-31 NOTE — Assessment & Plan Note (Signed)
Continue with statins.  ?

## 2023-01-01 LAB — CMP14+EGFR
ALT: 27 [IU]/L (ref 0–44)
AST: 21 [IU]/L (ref 0–40)
Albumin: 4.2 g/dL (ref 3.8–4.9)
Alkaline Phosphatase: 106 [IU]/L (ref 44–121)
BUN/Creatinine Ratio: 11 (ref 9–20)
BUN: 20 mg/dL (ref 6–24)
Bilirubin Total: 0.3 mg/dL (ref 0.0–1.2)
CO2: 20 mmol/L (ref 20–29)
Calcium: 9.2 mg/dL (ref 8.7–10.2)
Chloride: 110 mmol/L — ABNORMAL HIGH (ref 96–106)
Creatinine, Ser: 1.75 mg/dL — ABNORMAL HIGH (ref 0.76–1.27)
Globulin, Total: 3.3 g/dL (ref 1.5–4.5)
Glucose: 96 mg/dL (ref 70–99)
Potassium: 4.7 mmol/L (ref 3.5–5.2)
Sodium: 146 mmol/L — ABNORMAL HIGH (ref 134–144)
Total Protein: 7.5 g/dL (ref 6.0–8.5)
eGFR: 45 mL/min/{1.73_m2} — ABNORMAL LOW (ref >59)

## 2023-01-01 LAB — CBC WITH DIFFERENTIAL/PLATELET
Basophils Absolute: 0 10*3/uL (ref 0.0–0.2)
Basos: 0 %
EOS (ABSOLUTE): 0.1 10*3/uL (ref 0.0–0.4)
Eos: 2 %
Hematocrit: 37.3 % — ABNORMAL LOW (ref 37.5–51.0)
Hemoglobin: 11.7 g/dL — ABNORMAL LOW (ref 13.0–17.7)
Immature Grans (Abs): 0 10*3/uL (ref 0.0–0.1)
Immature Granulocytes: 0 %
Lymphocytes Absolute: 1.2 10*3/uL (ref 0.7–3.1)
Lymphs: 20 %
MCH: 25.4 pg — ABNORMAL LOW (ref 26.6–33.0)
MCHC: 31.4 g/dL — ABNORMAL LOW (ref 31.5–35.7)
MCV: 81 fL (ref 79–97)
Monocytes Absolute: 0.4 10*3/uL (ref 0.1–0.9)
Monocytes: 7 %
Neutrophils Absolute: 4.1 10*3/uL (ref 1.4–7.0)
Neutrophils: 71 %
Platelets: 255 10*3/uL (ref 150–450)
RBC: 4.61 x10E6/uL (ref 4.14–5.80)
RDW: 15 % (ref 11.6–15.4)
WBC: 5.8 10*3/uL (ref 3.4–10.8)

## 2023-01-01 LAB — LIPID PANEL
Chol/HDL Ratio: 3 {ratio} (ref 0.0–5.0)
Cholesterol, Total: 126 mg/dL (ref 100–199)
HDL: 42 mg/dL (ref >39)
LDL Chol Calc (NIH): 63 mg/dL (ref 0–99)
Triglycerides: 117 mg/dL (ref 0–149)
VLDL Cholesterol Cal: 21 mg/dL (ref 5–40)

## 2023-01-01 LAB — HCV AB W REFLEX TO QUANT PCR: HCV Ab: NONREACTIVE

## 2023-01-01 LAB — HCV INTERPRETATION

## 2023-01-01 NOTE — Progress Notes (Signed)
Let pt know kidney stable chlolesterol at goal. All other labs normal  No medication changes. Blood count normal no bleeding on blood thinner

## 2023-01-02 ENCOUNTER — Encounter (HOSPITAL_BASED_OUTPATIENT_CLINIC_OR_DEPARTMENT_OTHER): Payer: No Typology Code available for payment source | Admitting: Physical Medicine and Rehabilitation

## 2023-01-02 ENCOUNTER — Encounter: Payer: Self-pay | Admitting: Physical Medicine and Rehabilitation

## 2023-01-02 VITALS — BP 148/87 | HR 91 | Ht 64.0 in | Wt 200.0 lb

## 2023-01-02 DIAGNOSIS — I69354 Hemiplegia and hemiparesis following cerebral infarction affecting left non-dominant side: Secondary | ICD-10-CM

## 2023-01-02 DIAGNOSIS — Z993 Dependence on wheelchair: Secondary | ICD-10-CM

## 2023-01-02 DIAGNOSIS — R252 Cramp and spasm: Secondary | ICD-10-CM | POA: Diagnosis not present

## 2023-01-02 DIAGNOSIS — E66811 Obesity, class 1: Secondary | ICD-10-CM | POA: Diagnosis not present

## 2023-01-02 DIAGNOSIS — I69398 Other sequelae of cerebral infarction: Secondary | ICD-10-CM

## 2023-01-02 MED ORDER — DANTROLENE SODIUM 50 MG PO CAPS: 50.0000 mg | ORAL_CAPSULE | Freq: Every day | ORAL | 5 refills | Status: DC

## 2023-01-02 NOTE — Progress Notes (Signed)
Subjective:    Patient ID: [name], male    DOB: 06-Apr-1965, 57 y.o.   MRN: [id number]  HPI  Pt is a 57  yr old male with previous CVA with L hemiplegia- with recent new onset Afib, and worsening spasticity; sacral unstagable ulcer-; gout flare- resolved; Acute on chornic AKI/CKD 3; DM with A1c of 6.7  Here for f/u on CVA.    Pt last got Botulinum toxin (Xeomin)  11/13/22-Dr Kirsteins   FCR 50 FPL 25   FDP 75 FDS 75 Pectoralis 75  Wouldn't get refill of Dantrolene without a f/u.  Been off 3 weeks-   Was helping a LOT! Hand and fingers were more loose and fingers were easier to move without feeling like would break/hurt him.  Not as tight or in fist anymore.  LLE was less tight/less painful.     Pain Inventory Average Pain 0 Pain Right Now 0 My pain is  no pain  In the last 24 hours, has pain interfered with the following? General activity 0 Relationo pain with others 0 Enjoyment of life 0 What TIME of day is your pain at its worst? No pain Sleep (in general) Good  Pain is worse with:  no pain Pain improves with:  no pain Relief from Meds:  no pain  Family History  Problem Relation Age of Onset   Hypertension Mother    Heart disease Mother    Hyperlipidemia Mother    Heart failure Mother    Hypertension Sister    Hypertension Brother    Stroke Father    Hypertension Father    Colon cancer Maternal Grandmother    Diabetes Maternal Grandmother    Esophageal cancer Neg Hx    Social History   Socioeconomic History   Marital status: Married    Spouse name: Marcelino Duster   Number of children: 1   Years of education: 16   Highest education level: Not on file  Occupational History   Occupation: disabled  Tobacco Use   Smoking status: Never   Smokeless tobacco: Never  Vaping Use   Vaping status: Never Used  Substance and Sexual Activity   Alcohol use: Not Currently   Drug use: No   Sexual activity: Not Currently  Other Topics Concern   Not on  file  Social History Narrative   Patient drinks caffeine a couple times a week.   Patient is right handed.   Lives with wife Marcelino Duster) and daughter   Social Drivers of Health   Financial Resource Strain: Low Risk  (07/22/2022)   Overall Financial Resource Strain (CARDIA)    Difficulty of Paying Living Expenses: Not hard at all  Food Insecurity: No Food Insecurity (07/22/2022)   Hunger Vital Sign    Worried About Running Out of Food in the Last Year: Never true    Ran Out of Food in the Last Year: Never true  Transportation Needs: No Transportation Needs (07/22/2022)   PRAPARE - Administrator, Civil Service (Medical): No    Lack of Transportation (Non-Medical): No  Physical Activity: Inactive (07/22/2022)   Exercise Vital Sign    Days of Exercise per Week: 0 days    Minutes of Exercise per Session: 0 min  Stress: No Stress Concern Present (07/22/2022)   [employer] of Occupational Health - Occupational Stress Questionnaire    Feeling of Stress : Not at all  Social Connections: Moderately Integrated (07/22/2022)   Social Connection and Isolation Panel [NHANES]  Frequency of Communication with Friends and Family: More than three times a week    Frequency of Social Gatherings with Friends and Family: Three times a week    Attends Religious Services: More than 4 times per year    Active Member of Clubs or Organizations: No    Attends Banker Meetings: Never    Marital Status: Married   Past Surgical History:  Procedure Laterality Date   APPENDECTOMY     BIOPSY  11/18/2020   Procedure: BIOPSY;  Surgeon: Imogene Burn, MD;  Location: [facility] ENDOSCOPY;  Service: Gastroenterology;;   COLONOSCOPY N/A 11/18/2020   Procedure: COLONOSCOPY;  Surgeon: Imogene Burn, MD;  Location: [facility] ENDOSCOPY;  Service: Gastroenterology;  Laterality: N/A;   ESOPHAGOGASTRODUODENOSCOPY (EGD) WITH PROPOFOL N/A 11/18/2020   Procedure: ESOPHAGOGASTRODUODENOSCOPY (EGD) WITH PROPOFOL;  Surgeon:  Imogene Burn, MD;  Location: [facility] ENDOSCOPY;  Service: Gastroenterology;  Laterality: N/A;   JOINT REPLACEMENT Left 2004   Left hip   POLYPECTOMY  11/18/2020   Procedure: POLYPECTOMY;  Surgeon: Imogene Burn, MD;  Location: [facility] ENDOSCOPY;  Service: Gastroenterology;;   TOTAL HIP ARTHROPLASTY     Past Surgical History:  Procedure Laterality Date   APPENDECTOMY     BIOPSY  11/18/2020   Procedure: BIOPSY;  Surgeon: Imogene Burn, MD;  Location[facility] ENDOSCOPY;  Service: Gastroenterology;;   COLONOSCOPY N/A 11/18/2020   Procedure: COLONOSCOPY;  Surgeon: Imogene Burn, MD;  Location: [facility] ENDOSCOPY;  Service: Gastroenterology;  Laterality: N/A;   ESOPHAGOGASTRODUODENOSCOPY (EGD) WITH PROPOFOL N/A 11/18/2020   Procedure: ESOPHAGOGASTRODUODENOSCOPY (EGD) WITH PROPOFOL;  Surgeon: Imogene Burn, MD;  Location: [facility] ENDOSCOPY;  Service: Gastroenterology;  Laterality: N/A;   JOINT REPLACEMENT Left 2004   Left hip   POLYPECTOMY  11/18/2020   Procedure: POLYPECTOMY;  Surgeon: Imogene Burn, MD;  Location: [facility] ENDOSCOPY;  Service: Gastroenterology;;   TOTAL HIP ARTHROPLASTY     Past Medical History:  Diagnosis Date   Diabetes mellitus without complication (HCC)    Gait abnormality 03/31/2018   Hemiparesis and speech and language deficit as late effects of stroke (HCC) 03/31/2018   Hypertension    Osteomyelitis of coccyx (HCC) 02/19/2021   Pressure injury of skin 11/26/2020   Rectal cancer (HCC) 2009   rectal   Sacral osteomyelitis (HCC) 02/19/2021   Seizure (HCC)    Stroke (HCC) 2018   BP (!) 148/87   Pulse 91   Ht 5\' 4"  (1.626 m)   Wt 200 lb (90.7 kg) Comment: last recorded  SpO2 98%   BMI 34.33 kg/m   Opioid Risk Score:   Fall Risk Score:  `1  Depression screen [facility] 2/9     01/02/2023    1:35 PM 12/31/2022    9:32 AM 12/30/2022   11:45 AM 11/13/2022   12:30 PM 07/22/2022   10:44 AM 07/16/2022    9:35 AM 06/03/2022    9:40 AM  Depression screen PHQ 2/9  Decreased Interest 0 0 0 0 0 0  0  Down, Depressed, Hopeless 0 0 0 0 0 0 0  PHQ - 2 Score 0 0 0 0 0 0 0  Altered sleeping  0       Tired, decreased energy  0       Change in appetite  0       Feeling bad or failure about yourself   0       Trouble concentrating  0       Moving slowly  or fidgety/restless  0       Suicidal thoughts  0       PHQ-9 Score  0       Difficult doing work/chores  Not difficult at all          Review of Systems  Constitutional: Negative.   HENT: Negative.    Eyes: Negative.   Respiratory: Negative.    Cardiovascular: Negative.   Gastrointestinal: Negative.   Endocrine: Negative.   Genitourinary: Negative.   Musculoskeletal:  Positive for gait problem.  Skin: Negative.   Allergic/Immunologic: Negative.   Hematological:  Bruises/bleeds easily.       Eliquis  Psychiatric/Behavioral: Negative.    All other systems reviewed and are negative.      Objective:   Physical Exam Awake,alert, appropriate, in manual w/c; quiet; NAD Back to where I saw his spasticity prior to the Dantrolene MAS of 3 in LUE- can extend fingers, but very difficulty MAS of 2 in LLE- hip, knee, and ankle-  No clonus (+) hoffman's in LUE     Assessment & Plan:   Pt is a 57  yr old male with previous CVA with L hemiplegia- with recent new onset Afib, and worsening spasticity; sacral unstagable ulcer-; gout flare- resolved; Acute on chornic AKI/CKD 3; DM with A1c of 6.7  Here for f/u on CVA.   Will restart Dantrolene- 50 mg nightly x 3-4 days; then 50 mg 2x/day x 3-4 days, then 50 mg in Am and 100 mg nightly for 3-4 days, then 100 mg 2x/day- for spasticity- cannot use Baclofen  due to chronic renal failure- attempted Zanaflex- - BP went too low.   2.   Got CMP/LFT's right after stopped Dantrolene, so not clear if low due to not causing elevation OR because they reduced during the 3 weeks since stopped the medicine- so will need to order again.    3.  It appears insurance refused to cover more Dantrolene until  patient seen again- in spite of strong improvement in Symptoms of spasticity- so will restart- and file any appeals necessary- since could do hand hygiene on L hand now as well as move LUE/LLE  better with improved ROM.   4. Wear Night splint nightly! To prevent contractures.    5. Will check LFT's- in 6 weeks- and then recheck at f/u and go from there-    6. Xeomin injections is due in January 29th.  By Dr Wynn Banker.    7. Call me about foot plate- won't stay up, etc about power w/c-w/c will have label and number- so I can give Rx to get it fixed.    8. F/U in 3 months- f/u on spasticity and CVA with L hemi    I spent a total of 32   minutes on total care today- >50% coordination of care- due to d/w pt about spasticity; and w/c- as detailed above- also discussed how insurance won't pay for now and how to overcome this

## 2023-01-02 NOTE — Patient Instructions (Signed)
Pt is a 57  yr old male with previous CVA with L hemiplegia- with recent new onset Afib, and worsening spasticity; sacral unstagable ulcer-; gout flare- resolved; Acute on chornic AKI/CKD 3; DM with A1c of 6.7  Here for f/u on CVA.   Will restart Dantrolene- 50 mg nightly x 3-4 days; then 50 mg 2x/day x 3-4 days, then 50 mg in Am and 100 mg nightly for 3-4 days, then 100 mg 2x/day- for spasticity- cannot use Baclofen  due to chronic renal failure- attempted Zanaflex- - BP went too low.   2.   Got CMP/LFT's right after stopped Dantrolene, so not clear if low due to not causing elevation OR because they reduced during the 3 weeks since stopped the medicine- so will need to order again.    3.  It appears insurance refused to cover more Dantrolene until patient seen again- in spite of strong improvement in Symptoms of spasticity- so will restart- and file any appeals necessary- since could do hand hygiene on L hand now as well as move LUE/LLE  better with improved ROM.   4. Wear Night splint nightly! To prevent contractures.    5. Will check LFT's- in 6 weeks- and then recheck at f/u and go from there-    6. Xeomin injections is due in January 29th.  By Dr Wynn Banker.    7. Call me about foot plate- won't stay up, etc about power w/c-w/c will have label and number- so I can give Rx to get it fixed.    8. F/U in 3 months- f/u on spasticity and CVA with L hemi

## 2023-01-20 ENCOUNTER — Other Ambulatory Visit: Payer: Self-pay | Admitting: Physician Assistant

## 2023-01-20 DIAGNOSIS — D509 Iron deficiency anemia, unspecified: Secondary | ICD-10-CM

## 2023-01-26 ENCOUNTER — Other Ambulatory Visit: Payer: Self-pay | Admitting: Internal Medicine

## 2023-02-10 ENCOUNTER — Encounter: Payer: Self-pay | Admitting: Physical Medicine & Rehabilitation

## 2023-02-10 ENCOUNTER — Encounter
Payer: No Typology Code available for payment source | Attending: Physical Medicine & Rehabilitation | Admitting: Physical Medicine & Rehabilitation

## 2023-02-10 VITALS — BP 142/88 | HR 89 | Ht 64.0 in | Wt 200.0 lb

## 2023-02-10 DIAGNOSIS — I69354 Hemiplegia and hemiparesis following cerebral infarction affecting left non-dominant side: Secondary | ICD-10-CM | POA: Diagnosis not present

## 2023-02-10 MED ORDER — SODIUM CHLORIDE (PF) 0.9 % IJ SOLN
6.0000 mL | Freq: Once | INTRAMUSCULAR | Status: AC
  Administered 2023-02-10: 6 mL

## 2023-02-10 MED ORDER — INCOBOTULINUMTOXINA 100 UNITS IM SOLR
300.0000 [IU] | Freq: Once | INTRAMUSCULAR | Status: AC
  Administered 2023-02-10: 300 [IU] via INTRAMUSCULAR

## 2023-02-10 NOTE — Progress Notes (Signed)
Xeomin Injection for spasticity using needle EMG guidance  Dilution: 50 Units/ml Indication: Severe spasticity which interferes with ADL,mobility and/or  hygiene and is unresponsive to medication management and other conservative care Informed consent was obtained after describing risks and benefits of the procedure with the patient. This includes bleeding, bruising, infection, excessive weakness, or medication side effects. A REMS form is on file and signed.  27g 1" needle electrode Number of units per muscle FCR 50 FPL 25  FDP 75 FDS 75 Pectoralis 75 All injections were done after obtaining appropriate EMG activity and after negative drawback for blood. The patient tolerated the procedure well. Post procedure instructions were given. A followup appointment was made.

## 2023-02-10 NOTE — Patient Instructions (Signed)
Marland Kitchen

## 2023-03-30 ENCOUNTER — Encounter
Payer: No Typology Code available for payment source | Attending: Physical Medicine & Rehabilitation | Admitting: Physical Medicine and Rehabilitation

## 2023-03-30 ENCOUNTER — Encounter: Payer: Self-pay | Admitting: Physical Medicine and Rehabilitation

## 2023-03-30 VITALS — BP 131/90 | HR 88 | Ht 64.0 in

## 2023-03-30 DIAGNOSIS — R252 Cramp and spasm: Secondary | ICD-10-CM | POA: Insufficient documentation

## 2023-03-30 DIAGNOSIS — I69398 Other sequelae of cerebral infarction: Secondary | ICD-10-CM | POA: Insufficient documentation

## 2023-03-30 DIAGNOSIS — Z993 Dependence on wheelchair: Secondary | ICD-10-CM | POA: Insufficient documentation

## 2023-03-30 DIAGNOSIS — I69354 Hemiplegia and hemiparesis following cerebral infarction affecting left non-dominant side: Secondary | ICD-10-CM | POA: Diagnosis not present

## 2023-03-30 MED ORDER — DANTROLENE SODIUM 100 MG PO CAPS: 100.0000 mg | ORAL_CAPSULE | Freq: Two times a day (BID) | ORAL | 1 refills | Status: DC

## 2023-03-30 NOTE — Progress Notes (Signed)
 Subjective:    Patient ID: [name], male    DOB: 01-23-1965, 58 y.o.   MRN: [id number]  HPI Pt is a 58  yr old male with previous CVA with L hemiplegia- with recent new onset Afib, and worsening spasticity; sacral unstagable ulcer-; gout flare- resolved; Acute on chornic AKI/CKD 3; DM with A1c of 6.7  Here for f/u on CVA.    Got last Xeomin dosing by Dr Wynn Banker 02/10/23 FCR 50 FPL 25   FDP 75 FDS 75 Pectoralis 75   Things "going slow" Taking it easy.   Doing what he's supposed to do- eating the right foods; taking meds;   Back on Dantrolene since I last saw him-  Had run out of it, since hadn't bene here, and pain much worse in hand on L.   Wearing night splint on L hand on Hand.    No pain.   Everything pretty stable.   Got Xeomin 2 months ago- nothing new.    Hard ot transport power w/c, so in manual w/c today.  Pain Inventory Average Pain 0 Pain Right Now 0 My pain is  No pain  LOCATION OF PAIN  No pain  BOWEL Number of stools per week: 5 Oral laxative use No  Type of laxative none Enema or suppository use No  History of colostomy No  Incontinent No   BLADDER Pads    Mobility needs help with transfers transfers alone Do you have any goals in this area?  yes  Function disabled: date disabled 1989 I need assistance with the following:  bathing, household duties, and shopping Do you have any goals in this area?  yes  Neuro/Psych trouble walking spasms dizziness  Prior Studies Any changes since last visit?  no  Physicians involved in your care Any changes since last visit?  no   Family History  Problem Relation Age of Onset   Hypertension Mother    Heart disease Mother    Hyperlipidemia Mother    Heart failure Mother    Hypertension Sister    Hypertension Brother    Stroke Father    Hypertension Father    Colon cancer Maternal Grandmother    Diabetes Maternal Grandmother    Esophageal cancer Neg Hx    Social  History   Socioeconomic History   Marital status: Married    Spouse name: Marcelino Duster   Number of children: 1   Years of education: 16   Highest education level: Not on file  Occupational History   Occupation: disabled  Tobacco Use   Smoking status: Never   Smokeless tobacco: Never  Vaping Use   Vaping status: Never Used  Substance and Sexual Activity   Alcohol use: Not Currently   Drug use: No   Sexual activity: Not Currently  Other Topics Concern   Not on file  Social History Narrative   Patient drinks caffeine a couple times a week.   Patient is right handed.   Lives with wife Marcelino Duster) and daughter   Social Drivers of Health   Financial Resource Strain: Low Risk  (07/22/2022)   Overall Financial Resource Strain (CARDIA)    Difficulty of Paying Living Expenses: Not hard at all  Food Insecurity: No Food Insecurity (07/22/2022)   Hunger Vital Sign    Worried About Running Out of Food in the Last Year: Never true    Ran Out of Food in the Last Year: Never true  Transportation Needs: No Transportation Needs (07/22/2022)  PRAPARE - Administrator, Civil Service (Medical): No    Lack of Transportation (Non-Medical): No  Physical Activity: Inactive (07/22/2022)   Exercise Vital Sign    Days of Exercise per Week: 0 days    Minutes of Exercise per Session: 0 min  Stress: No Stress Concern Present (07/22/2022)   [employer] of Occupational Health - Occupational Stress Questionnaire    Feeling of Stress : Not at all  Social Connections: Moderately Integrated (07/22/2022)   Social Connection and Isolation Panel [NHANES]    Frequency of Communication with Friends and Family: More than three times a week    Frequency of Social Gatherings with Friends and Family: Three times a week    Attends Religious Services: More than 4 times per year    Active Member of Clubs or Organizations: No    Attends Banker Meetings: Never    Marital Status: Married   Past  Surgical History:  Procedure Laterality Date   APPENDECTOMY     BIOPSY  11/18/2020   Procedure: BIOPSY;  Surgeon: Imogene Burn, MD;  Location: [facility] ENDOSCOPY;  Service: Gastroenterology;;   COLONOSCOPY N/A 11/18/2020   Procedure: COLONOSCOPY;  Surgeon: Imogene Burn, MD;  Location: [facility] ENDOSCOPY;  Service: Gastroenterology;  Laterality: N/A;   ESOPHAGOGASTRODUODENOSCOPY (EGD) WITH PROPOFOL N/A 11/18/2020   Procedure: ESOPHAGOGASTRODUODENOSCOPY (EGD) WITH PROPOFOL;  Surgeon: Imogene Burn, MD;  Location: [facility] ENDOSCOPY;  Service: Gastroenterology;  Laterality: N/A;   JOINT REPLACEMENT Left 2004   Left hip   POLYPECTOMY  11/18/2020   Procedure: POLYPECTOMY;  Surgeon: Imogene Burn, MD;  Location: [facility] ENDOSCOPY;  Service: Gastroenterology;;   TOTAL HIP ARTHROPLASTY     Past Medical History:  Diagnosis Date   Diabetes mellitus without complication (HCC)    Gait abnormality 03/31/2018   Hemiparesis and speech and language deficit as late effects of stroke (HCC) 03/31/2018   Hypertension    Osteomyelitis of coccyx (HCC) 02/19/2021   Pressure injury of skin 11/26/2020   Rectal cancer (HCC) 2009   rectal   Sacral osteomyelitis (HCC) 02/19/2021   Seizure (HCC)    Stroke (HCC) 2018   Ht 5\' 4"  (1.626 m)   BMI 34.33 kg/m   Opioid Risk Score:   Fall Risk Score:  `1  Depression screen [facility] 2/9     02/10/2023   10:42 AM 01/02/2023    1:35 PM 12/31/2022    9:32 AM 12/30/2022   11:45 AM 11/13/2022   12:30 PM 07/22/2022   10:44 AM 07/16/2022    9:35 AM  Depression screen PHQ 2/9  Decreased Interest 0 0 0 0 0 0 0  Down, Depressed, Hopeless 0 0 0 0 0 0 0  PHQ - 2 Score 0 0 0 0 0 0 0  Altered sleeping   0      Tired, decreased energy   0      Change in appetite   0      Feeling bad or failure about yourself    0      Trouble concentrating   0      Moving slowly or fidgety/restless   0      Suicidal thoughts   0      PHQ-9 Score   0      Difficult doing work/chores   Not difficult at all         Review of Systems  Musculoskeletal:  Positive for gait problem.  Spasms  Neurological:  Positive for dizziness.  All other systems reviewed and are negative.      Objective:   Physical Exam Awake, alert, appropriate, in manual w/c- needs lap tray on L, NAD  Neuro: MAS of 2 in fingers as well as hand/wrist- MAS of 1+ in L elbow and MAS of 1+ in L shoulder flexion and abduction MAS of 2 in L hip esp adduction-  MS of 2 in L knee And MAS of 1+ in L ankle No clonus   MSK: lost range in L shoulder External rotation- cannot do even 5 degrees- has a contracture of L shoulder ER Also has forming contracture of PIP on L 5th digit.  Lacking ~ 10 degrees of knee extension on L          Assessment & Plan:   Pt is a 58  yr old male with previous CVA with L hemiplegia- with recent new onset Afib, and worsening spasticity; sacral unstagable ulcer- healed-; gout flare- resolved; Acute on chornic AKI/CKD 3; DM with A1c of 6.7  Here for f/u on CVA.    Need to get CMP today since been back on Dantrolene for 3 months.  2. Con't dantrolene - but will change 100 mg capsule 2x/day-  Cannot not take Baclofen due to renal failure as well as Zanaflex isn't supposed to be given with renal failure either.    3. Has appt with Dr Wynn Banker 05/14/23 for Xeomin to be done. Which is reasonable.    4.  Will con't spasticity treatment with oral meds and Xeomin to keep spasticity adequately controlled.    5. F/U in 3 months- f/u on CVA and associated spasticity    I spent a total of 22   minutes on total care today- >50% coordination of care- due to d/w pt about spasticity- diet; and his Xeomin/Dantrolene.

## 2023-03-31 LAB — COMPREHENSIVE METABOLIC PANEL
ALT: 22 IU/L (ref 0–44)
AST: 22 IU/L (ref 0–40)
Albumin: 4.1 g/dL (ref 3.8–4.9)
Alkaline Phosphatase: 118 IU/L (ref 44–121)
BUN/Creatinine Ratio: 14 (ref 9–20)
BUN: 20 mg/dL (ref 6–24)
Bilirubin Total: 0.2 mg/dL (ref 0.0–1.2)
CO2: 21 mmol/L (ref 20–29)
Calcium: 9.3 mg/dL (ref 8.7–10.2)
Chloride: 106 mmol/L (ref 96–106)
Creatinine, Ser: 1.48 mg/dL — ABNORMAL HIGH (ref 0.76–1.27)
Globulin, Total: 2.8 g/dL (ref 1.5–4.5)
Glucose: 129 mg/dL — ABNORMAL HIGH (ref 70–99)
Potassium: 4.8 mmol/L (ref 3.5–5.2)
Sodium: 141 mmol/L (ref 134–144)
Total Protein: 6.9 g/dL (ref 6.0–8.5)
eGFR: 55 mL/min/{1.73_m2} — ABNORMAL LOW (ref >59)

## 2023-04-18 ENCOUNTER — Other Ambulatory Visit: Payer: Self-pay | Admitting: Critical Care Medicine

## 2023-04-18 DIAGNOSIS — E1159 Type 2 diabetes mellitus with other circulatory complications: Secondary | ICD-10-CM

## 2023-04-20 NOTE — Telephone Encounter (Signed)
 Requested Prescriptions  Refused Prescriptions Disp Refills   glipiZIDE (GLUCOTROL) 5 MG tablet [Pharmacy Med Name: GLIPIZIDE 5 MG TABLET] 90 tablet 1    Sig: TAKE 1 TABLET BY MOUTH DAILY BEFORE BREAKFAST. MUST HAVE OFFICE VISIT FOR REFILLS     Endocrinology:  Diabetes - Sulfonylureas Failed - 04/20/2023  4:30 PM      Failed - Cr in normal range and within 360 days    Creat  Date Value Ref Range Status  04/16/2021 1.61 (H) 0.70 - 1.30 mg/dL Final   Creatinine, Ser  Date Value Ref Range Status  03/30/2023 1.48 (H) 0.76 - 1.27 mg/dL Final         Passed - HBA1C is between 0 and 7.9 and within 180 days    HbA1c, POC (prediabetic range)  Date Value Ref Range Status  09/29/2019 6.2 5.7 - 6.4 % Final   HbA1c, POC (controlled diabetic range)  Date Value Ref Range Status  12/31/2022 6.7 0.0 - 7.0 % Final         Passed - Valid encounter within last 6 months    Recent Outpatient Visits           3 months ago Type 2 diabetes mellitus with vascular disease (HCC)   [facility] [place]. [facility] Storm Frisk, MD   1 year ago Type 2 diabetes mellitus with vascular disease [facility])   [facility] [facility] [place]. [facility] Marcine Matar, MD   1 year ago Physical deconditioning   [facility] [place]. [facility] [place], [place], [place]   1 year ago Hemiparesis affecting left side as late effect of cerebrovascular accident (CVA) [facility])   [place] [facility] [place]. [facility] Marcine Matar, MD   1 year ago Appointment canceled by hospital   [facility] - [facility] Of [place]. [facility] Marcine Matar, MD       Future Appointments             In 2 months [place] Binnie Rail, MD [facility] Of [place]. [facility]

## 2023-05-14 ENCOUNTER — Encounter: Payer: Medicare HMO | Admitting: Physical Medicine & Rehabilitation

## 2023-05-16 ENCOUNTER — Other Ambulatory Visit: Payer: Self-pay | Admitting: Critical Care Medicine

## 2023-05-16 DIAGNOSIS — I480 Paroxysmal atrial fibrillation: Secondary | ICD-10-CM

## 2023-05-16 DIAGNOSIS — I152 Hypertension secondary to endocrine disorders: Secondary | ICD-10-CM

## 2023-05-16 DIAGNOSIS — E1159 Type 2 diabetes mellitus with other circulatory complications: Secondary | ICD-10-CM

## 2023-06-26 ENCOUNTER — Encounter: Admitting: Physical Medicine and Rehabilitation

## 2023-07-02 ENCOUNTER — Encounter (HOSPITAL_COMMUNITY): Payer: Self-pay | Admitting: Emergency Medicine

## 2023-07-02 ENCOUNTER — Encounter: Payer: Self-pay | Admitting: Internal Medicine

## 2023-07-02 ENCOUNTER — Other Ambulatory Visit: Payer: Self-pay

## 2023-07-02 ENCOUNTER — Ambulatory Visit (HOSPITAL_COMMUNITY)
Admission: EM | Admit: 2023-07-02 | Discharge: 2023-07-02 | Disposition: A | Discharge status: Home / Self Care | Attending: Family Medicine | Admitting: Family Medicine

## 2023-07-02 ENCOUNTER — Emergency Department (HOSPITAL_COMMUNITY)

## 2023-07-02 ENCOUNTER — Ambulatory Visit: Payer: Medicare HMO | Attending: Internal Medicine | Admitting: Internal Medicine

## 2023-07-02 ENCOUNTER — Emergency Department (HOSPITAL_COMMUNITY): Admission: EM | Admit: 2023-07-02 | Discharge: 2023-07-02 | Discharge status: Against Medical Advice

## 2023-07-02 VITALS — BP 150/89 | HR 91 | Temp 98.3°F | Ht 64.0 in

## 2023-07-02 DIAGNOSIS — L853 Xerosis cutis: Secondary | ICD-10-CM

## 2023-07-02 DIAGNOSIS — N1831 Chronic kidney disease, stage 3a: Secondary | ICD-10-CM

## 2023-07-02 DIAGNOSIS — Z5321 Procedure and treatment not carried out due to patient leaving prior to being seen by health care provider: Secondary | ICD-10-CM | POA: Insufficient documentation

## 2023-07-02 DIAGNOSIS — Z7901 Long term (current) use of anticoagulants: Secondary | ICD-10-CM | POA: Diagnosis not present

## 2023-07-02 DIAGNOSIS — S01312A Laceration without foreign body of left ear, initial encounter: Secondary | ICD-10-CM | POA: Insufficient documentation

## 2023-07-02 DIAGNOSIS — Z2821 Immunization not carried out because of patient refusal: Secondary | ICD-10-CM

## 2023-07-02 DIAGNOSIS — E785 Hyperlipidemia, unspecified: Secondary | ICD-10-CM | POA: Diagnosis not present

## 2023-07-02 DIAGNOSIS — D329 Benign neoplasm of meninges, unspecified: Secondary | ICD-10-CM | POA: Diagnosis not present

## 2023-07-02 DIAGNOSIS — I129 Hypertensive chronic kidney disease with stage 1 through stage 4 chronic kidney disease, or unspecified chronic kidney disease: Secondary | ICD-10-CM

## 2023-07-02 DIAGNOSIS — I480 Paroxysmal atrial fibrillation: Secondary | ICD-10-CM

## 2023-07-02 DIAGNOSIS — W07XXXA Fall from chair, initial encounter: Secondary | ICD-10-CM | POA: Insufficient documentation

## 2023-07-02 DIAGNOSIS — S0990XA Unspecified injury of head, initial encounter: Secondary | ICD-10-CM

## 2023-07-02 DIAGNOSIS — E1159 Type 2 diabetes mellitus with other circulatory complications: Secondary | ICD-10-CM

## 2023-07-02 DIAGNOSIS — D649 Anemia, unspecified: Secondary | ICD-10-CM | POA: Diagnosis not present

## 2023-07-02 DIAGNOSIS — Z7984 Long term (current) use of oral hypoglycemic drugs: Secondary | ICD-10-CM | POA: Diagnosis not present

## 2023-07-02 DIAGNOSIS — E1169 Type 2 diabetes mellitus with other specified complication: Secondary | ICD-10-CM

## 2023-07-02 DIAGNOSIS — R519 Headache, unspecified: Secondary | ICD-10-CM | POA: Insufficient documentation

## 2023-07-02 DIAGNOSIS — S199XXA Unspecified injury of neck, initial encounter: Secondary | ICD-10-CM | POA: Diagnosis not present

## 2023-07-02 DIAGNOSIS — E119 Type 2 diabetes mellitus without complications: Secondary | ICD-10-CM

## 2023-07-02 DIAGNOSIS — Z23 Encounter for immunization: Secondary | ICD-10-CM | POA: Diagnosis not present

## 2023-07-02 DIAGNOSIS — I69354 Hemiplegia and hemiparesis following cerebral infarction affecting left non-dominant side: Secondary | ICD-10-CM

## 2023-07-02 DIAGNOSIS — M542 Cervicalgia: Secondary | ICD-10-CM | POA: Diagnosis not present

## 2023-07-02 DIAGNOSIS — I152 Hypertension secondary to endocrine disorders: Secondary | ICD-10-CM

## 2023-07-02 DIAGNOSIS — Y92002 Bathroom of unspecified non-institutional (private) residence single-family (private) house as the place of occurrence of the external cause: Secondary | ICD-10-CM | POA: Insufficient documentation

## 2023-07-02 LAB — POCT GLYCOSYLATED HEMOGLOBIN (HGB A1C): HbA1c, POC (controlled diabetic range): 6.5 % (ref 0.0–7.0)

## 2023-07-02 LAB — GLUCOSE, POCT (MANUAL RESULT ENTRY): POC Glucose: 169 mg/dL — AB (ref 70–99)

## 2023-07-02 MED ORDER — ACCU-CHEK SOFTCLIX LANCETS MISC: 12 refills | Status: AC

## 2023-07-02 MED ORDER — ACCU-CHEK GUIDE W/DEVICE KIT: PACK | 0 refills | Status: AC

## 2023-07-02 MED ORDER — ACCU-CHEK GUIDE TEST VI STRP
ORAL_STRIP | 12 refills | Status: AC
Start: 2023-07-02 — End: ?

## 2023-07-02 NOTE — ED Provider Triage Note (Cosign Needed)
 Emergency Medicine Provider Triage Evaluation Note  [name] , a 58 y.o. male  was evaluated in triage.  Pt complains of had a ground-level fall at home transitioning from the toilet to his wheelchair.  States that the brake was not engaged when he was trying to transition back to his chair causing his body to go laterally into the wall on his left side, had some initial bleeding from the left ear that was controlled prior to his arrival in the ED.  He has no loss of consciousness, no nausea, no vomiting, no other focal neurologic symptoms.  At time of presentation he does not have any pain or tenderness in the neck, no headache, and has not complained of any other symptoms at this time.  Presents primarily out of concern that he takes Eliquis  regularly..  Review of Systems  Positive: Laceration to left ear Negative:   Physical Exam  BP (!) 164/81 (BP Location: Right Arm)   Pulse (!) 113   Temp 99.5 F (37.5 C)   Resp 20   SpO2 94%  Gen:   Awake, no distress   Resp:  Normal effort  MSK:   Moves extremities without difficulty except for extremities on the left side which is affected by paraplegia secondary to previous CVA. Other:    Medical Decision Making  Medically screening exam initiated at 3:17 PM.  Appropriate orders placed.  [name] was informed that the remainder of the evaluation will be completed by another provider, this initial triage assessment does not replace that evaluation, and the importance of remaining in the ED until their evaluation is complete.  Based on assessment of patient along with the clinical history, will evaluate with a CT imaging of the head and neck due to fall on thinners.  Otherwise given his clinical presentation, no further workup will be initiated at this time.   [name], [place] 07/02/23 [phone]

## 2023-07-02 NOTE — ED Provider Notes (Signed)
   [facility]   [id number] 07/02/23 Arrival Time: 1324  ASSESSMENT & PLAN:  1. Injury of head, initial encounter   2. Hx of Mark term use of blood thinners   3. Laceration of helix of left ear, initial encounter    To ED via POV for further evaluation and head imaging. Cannot perform here. Discussed. Stable upon discharge.   Reviewed expectations re: course of current medical issues. Questions answered. Outlined signs and symptoms indicating need for more acute intervention. Patient verbalized understanding. After Visit Summary given.  SUBJECTIVE: History from: patient and family. Patient is able to give a clear and coherent history.   [name] is a 58 y.o. male who presents with complaint of a head injury within the hour; at [facility]; fell in bathroom hitting L head/ear on sink and wall; denies LOC. On Eliquis . Denies n/v.   OBJECTIVE:  Vitals:   07/02/23 1344  BP: 132/79  Pulse: 100  Resp: 18  Temp: 97.9 F (36.6 C)  TempSrc: Oral  SpO2: 95%     GCS: 15 General appearance: alert; no distress HEENT: normocephalic; small puncture of L ear over helix with mild swelling and slight discoloration; without active bleeding Neck: without midline TTP Skin: warm and dry Psychological: alert and cooperative; normal mood and affect    No Known Allergies Past Medical History:  Diagnosis Date   Diabetes mellitus without complication (HCC)    Gait abnormality 03/31/2018   Hemiparesis and speech and language deficit as late effects of stroke (HCC) 03/31/2018   Hypertension    Osteomyelitis of coccyx (HCC) 02/19/2021   Pressure injury of skin 11/26/2020   Rectal cancer (HCC) 2009   rectal   Sacral osteomyelitis (HCC) 02/19/2021   Seizure (HCC)    Stroke (HCC) 2018   Past Surgical History:  Procedure Laterality Date   APPENDECTOMY     BIOPSY  11/18/2020   Procedure: BIOPSY;  Surgeon: Daina Drum, MD;  Location: [facility] ENDOSCOPY;  Service:  Gastroenterology;;   COLONOSCOPY N/A 11/18/2020   Procedure: COLONOSCOPY;  Surgeon: Daina Drum, MD;  Location: [facility] ENDOSCOPY;  Service: Gastroenterology;  Laterality: N/A;   ESOPHAGOGASTRODUODENOSCOPY (EGD) WITH PROPOFOL  N/A 11/18/2020   Procedure: ESOPHAGOGASTRODUODENOSCOPY (EGD) WITH PROPOFOL ;  Surgeon: Daina Drum, MD;  Location: [facility] ENDOSCOPY;  Service: Gastroenterology;  Laterality: N/A;   JOINT REPLACEMENT Left 2004   Left hip   POLYPECTOMY  11/18/2020   Procedure: POLYPECTOMY;  Surgeon: Daina Drum, MD;  Location: [facility] ENDOSCOPY;  Service: Gastroenterology;;   TOTAL HIP ARTHROPLASTY     Family History  Problem Relation Age of Onset   Hypertension Mother    Heart disease Mother    Hyperlipidemia Mother    Heart failure Mother    Hypertension Sister    Hypertension Brother    Stroke Father    Hypertension Father    Colon cancer Maternal Grandmother    Diabetes Maternal Grandmother    Esophageal cancer Neg Hx        Afton Albright, MD 07/02/23 1646

## 2023-07-02 NOTE — ED Notes (Signed)
 Pt along with wife leaving the ER AMA

## 2023-07-02 NOTE — ED Triage Notes (Signed)
 Pt reports he was transferring from the toilet to his chair and fell. Pt reports the left side of his head hit the wall. Pt did take his eliquis  this morning.

## 2023-07-02 NOTE — ED Notes (Signed)
 Patient is being discharged from the Urgent Care and sent to the Emergency Department via POV with family. Per Dr Barbar Bonus, patient is in need of higher level of care due to head injury taking Eliquis . Patient is aware and verbalizes understanding of plan of care.  Vitals:   07/02/23 1344  BP: 132/79  Pulse: 100  Resp: 18  Temp: 97.9 F (36.6 C)  SpO2: 95%

## 2023-07-02 NOTE — Progress Notes (Signed)
 Patient ID: [name], male    DOB: 1965-03-23  MRN: [id number]  CC: Diabetes (DM f/u./Discuss the discontinuation of meds/Yes to pneumonia vax)   Subjective: [name] is a 58 y.o. male who presents for chronic ds management. Wife is with him. 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    Discussed the use of AI scribe software for clinical note transcription with the patient, who gave verbal consent to proceed.  History of Present Illness [name] is a 58 year old male with diabetes, hypertension, and congestive heart failure who presents for a follow-up visit. He is accompanied by his wife.  DM:  Results for orders placed or performed in visit on 07/02/23  POCT glucose (manual entry)   Collection Time: 07/02/23 10:01 AM  Result Value Ref Range   POC Glucose 169 (A) 70 - 99 mg/dl  POCT glycosylated hemoglobin (Hb A1C)   Collection Time: 07/02/23 10:14 AM  Result Value Ref Range   Hemoglobin A1C     HbA1c POC (<> result, manual entry)     HbA1c, POC (prediabetic range)     HbA1c, POC (controlled diabetic range) 6.5 0.0 - 7.0 %   Diabetes is managed with glipizide  5 mg and Jardiance  10 mg daily. A1c is 6.5% with a blood sugar level of 169 mg/dL. He does not check blood sugar at home due to left-sided paresis and lacks testing supplies. His diet includes fresh fruits and vegetables.  HTN/CHF: Hypertension and congestive heart failure are managed with carvedilol  12.5 mg twice daily, losartan  25 mg 1/2 daily (I have confirmed that he is taking it this way even though prescription was written as 25 mg daily), and Cardizem  30 mg twice daily. He does not check blood pressure at home but has a device available. He limits salt intake. No chest pain or  shortness of breath.  CKD: GFR improved from 39 last year to 80 in March 2025. He avoids NSAIDs.  Atrial fibrillation: managed with Eliquis , Cardizem  and carvedilol . No heart racing, bruising, or bleeding.  Anemia is stable, with Hb around 11-12 with the last blood count checked in December 2024. He has not been taking iron supplements recently.  Cholesterol is managed with atorvastatin  80 mg and Zetia  10 mg daily.  He has a history of a one-time seizure and a meningioma, last checked with a CT scan in 2023, with no headaches or dizziness at this time.  Not on any seizure medications.  History of CVA with left spastic hemiparesis and dysarthria: He has been seeing PMR Dr. Lovvorn and Dr. Sharl [name].  Currently on dantrolene  and gets Xeomin  inj to help decrease spasticity in the left hand.  Wife and patient state it has helped.  HM: Agrees to pneumonia vaccine.  He initially agreed to receiving Shingrix vaccine series but then subsequently declined after his wife told him that he should not get it since he never had chickenpox.  However I told him that it is recommended for adults age 19 and older regardless of whether person has had chickenpox or shingles.  Patient still declined.    Patient Active Problem List   Diagnosis Date Noted   Wheelchair dependence 01/02/2023   Aortic atherosclerosis (HCC) 04/17/2022   Spastic hemiparesis of left nondominant side due to  acute cerebral infarction (HCC) 04/14/2022   Hyperlipidemia 02/12/2022   Chronic pain syndrome 01/02/2021   Spasticity as late effect of cerebrovascular accident (CVA) 01/02/2021   Esophageal ulcer without bleeding 12/25/2020   Adenomatous polyp of colon 12/25/2020   Sacral decubitus ulcer 12/17/2020   Hyponatremia 12/17/2020   Controlled type 2 diabetes mellitus with hyperglycemia, without long-term current use of insulin  (HCC)    New onset atrial fibrillation (HCC)    Debility 11/23/2020   History of colonic polyps     Atrial fibrillation with RVR (HCC) 11/14/2020   History of CVA with residual deficit 11/14/2020   Gout 11/14/2020   Microcytic hypochromic anemia 11/14/2020   History of meningioma 11/14/2020   Obesity (BMI 30.0-34.9) 11/14/2020   Pain due to onychomycosis of toenails of both feet 09/21/2020   Edema of both legs 09/29/2019   Meningioma (HCC) 04/13/2019   Hemiparesis and speech and language deficit as late effects of stroke (HCC) 03/31/2018   Gait abnormality 03/31/2018   Hemiplegia of left nondominant side as late effect of cerebral infarction (HCC) 12/18/2017   Subacromial impingement of left shoulder 06/09/2017   Spastic neurogenic bladder 06/09/2017   Dysarthria as late effect of stroke 04/01/2017   Neurogenic bladder due to old stroke 12/25/2016   Right pontine cerebrovascular accident (HCC) 12/18/2016   Left hemiparesis (HCC)    Diastolic dysfunction    Dysphagia, post-stroke    Hypertension 07/22/2014   Type 2 diabetes mellitus with vascular disease (HCC) 07/22/2014   Brain mass      Current Outpatient Medications on File Prior to Visit  Medication Sig Dispense Refill   apixaban  (ELIQUIS ) 5 MG TABS tablet Take 1 tablet (5 mg total) by mouth 2 (two) times daily. 180 tablet 3   atorvastatin  (LIPITOR ) 80 MG tablet Take 1 tablet (80 mg total) by mouth daily. 90 tablet 2   dantrolene  (DANTRIUM ) 100 MG capsule Take 1 capsule (100 mg total) by mouth 2 (two) times daily. To take due to severe spasticity from stroke in L side- cannot take Baclofen due to renal failure and zanaflex not ideal either 180 capsule 1   diltiazem  (CARDIZEM ) 30 MG tablet TAKE 1 TABLET BY MOUTH TWICE A DAY. Must have office visit for refills 180 tablet 1   empagliflozin  (JARDIANCE ) 10 MG TABS tablet Take 1 tablet (10 mg total) by mouth daily before breakfast. 90 tablet 2   ezetimibe  (ZETIA ) 10 MG tablet Take 1 tablet (10 mg total) by mouth daily. 90 tablet 3   glipiZIDE  (GLUCOTROL ) 5 MG tablet Take 1 tablet (5  mg total) by mouth daily before breakfast. Must have office visit for refills 90 tablet 1   losartan  (COZAAR ) 25 MG tablet TAKE 1 TABLET (25 MG TOTAL) BY MOUTH DAILY. 90 tablet 1   pantoprazole  (PROTONIX ) 40 MG tablet Take 1 tablet (40 mg total) by mouth daily. 90 tablet 2   solifenacin  (VESICARE ) 10 MG tablet Take 1 tablet (10 mg total) by mouth daily. For bladder issues related to stroke 90 tablet 1   ferrous sulfate  325 (65 FE) MG tablet TAKE 1 TABLET BY MOUTH EVERY MONDAY WEDNESDAY AND FRIDAY (Patient not taking: Reported on 07/02/2023) 36 tablet 5   No current facility-administered medications on file prior to visit.    No Known Allergies  Social History   Socioeconomic History   Marital status: Married    Spouse name: Moira Andrews   Number of children: 1   Years of education: 16   Highest education  level: Not on file  Occupational History   Occupation: disabled  Tobacco Use   Smoking status: Never   Smokeless tobacco: Never  Vaping Use   Vaping status: Never Used  Substance and Sexual Activity   Alcohol use: Not Currently   Drug use: No   Sexual activity: Not Currently  Other Topics Concern   Not on file  Social History Narrative   Patient drinks caffeine a couple times a week.   Patient is right handed.   Lives with wife Moira Andrews) and daughter   Social Drivers of Health   Financial Resource Strain: Low Risk  (07/02/2023)   Overall Financial Resource Strain (CARDIA)    Difficulty of Paying Living Expenses: Not hard at all  Food Insecurity: No Food Insecurity (07/02/2023)   Hunger Vital Sign    Worried About Running Out of Food in the Last Year: Never true    Ran Out of Food in the Last Year: Never true  Transportation Needs: No Transportation Needs (07/02/2023)   PRAPARE - Administrator, Civil Service (Medical): No    Lack of Transportation (Non-Medical): No  Physical Activity: Inactive (07/02/2023)   Exercise Vital Sign    Days of Exercise per Week: 0  days    Minutes of Exercise per Session: 0 min  Stress: No Stress Concern Present (07/02/2023)   [employer] of Occupational Health - Occupational Stress Questionnaire    Feeling of Stress: Not at all  Social Connections: Moderately Integrated (07/02/2023)   Social Connection and Isolation Panel    Frequency of Communication with Friends and Family: Once a week    Frequency of Social Gatherings with Friends and Family: Once a week    Attends Religious Services: More than 4 times per year    Active Member of [employer] or Organizations: Yes    Attends Banker Meetings: 1 to 4 times per year    Marital Status: Married  Catering manager Violence: Not At Risk (07/02/2023)   Humiliation, Afraid, Rape, and Kick questionnaire    Fear of Current or Ex-Partner: No    Emotionally Abused: No    Physically Abused: No    Sexually Abused: No    Family History  Problem Relation Age of Onset   Hypertension Mother    Heart disease Mother    Hyperlipidemia Mother    Heart failure Mother    Hypertension Sister    Hypertension Brother    Stroke Father    Hypertension Father    Colon cancer Maternal Grandmother    Diabetes Maternal Grandmother    Esophageal cancer Neg Hx     Past Surgical History:  Procedure Laterality Date   APPENDECTOMY     BIOPSY  11/18/2020   Procedure: BIOPSY;  Surgeon: Daina Drum, MD;  Location: [facility] ENDOSCOPY;  Service: Gastroenterology;;   COLONOSCOPY N/A 11/18/2020   Procedure: COLONOSCOPY;  Surgeon: Daina Drum, MD;  Location: [facility] ENDOSCOPY;  Service: Gastroenterology;  Laterality: N/A;   ESOPHAGOGASTRODUODENOSCOPY (EGD) WITH PROPOFOL  N/A 11/18/2020   Procedure: ESOPHAGOGASTRODUODENOSCOPY (EGD) WITH PROPOFOL ;  Surgeon: Daina Drum, MD;  Location: [facility] ENDOSCOPY;  Service: Gastroenterology;  Laterality: N/A;   JOINT REPLACEMENT Left 2004   Left hip   POLYPECTOMY  11/18/2020   Procedure: POLYPECTOMY;  Surgeon: Daina Drum, MD;  Location: [facility]  ENDOSCOPY;  Service: Gastroenterology;;   TOTAL HIP ARTHROPLASTY      ROS: Review of Systems Negative except as stated above  PHYSICAL EXAM: BP Aaron Aas)  150/89   Pulse 91   Temp 98.3 F (36.8 C) (Oral)   Ht 5' 4 (1.626 m)   SpO2 97%   BMI 34.33 kg/m   Physical Exam   General appearance - alert, well appearing, older African-American male sitting in manual wheelchair and in no distress Mental status -patient answers questions appropriately but wife helps with history.  His speech is dysarthric Mouth -tongue is moist Neck - supple, no significant adenopathy Chest - clear to auscultation, no wheezes, rales or rhonchi, symmetric air entry Heart - normal rate, regular rhythm, normal S1, S2, no murmurs, rubs, clicks or gallops Extremities -no lower extremity edema Diabetic Foot Exam - Simple   Simple Foot Form Diabetic Foot exam was performed with the following findings: Yes 07/02/2023 11:00 AM  Visual Inspection See comments: Yes Sensation Testing Intact to touch and monofilament testing bilaterally: Yes Pulse Check Posterior Tibialis and Dorsalis pulse intact bilaterally: Yes Comments Flatfoot good.  Patient has significant dry scaly skin on both the dorsum and plantar surface of both feet        Latest Ref Rng & Units 03/30/2023   10:58 AM 12/31/2022   10:18 AM 04/17/2022   11:45 AM  CMP  Glucose 70 - 99 mg/dL 102  96  725   BUN 6 - 24 mg/dL 20  20  21    Creatinine 0.76 - 1.27 mg/dL 3.66  4.40  3.47   Sodium 134 - 144 mmol/L 141  146  146   Potassium 3.5 - 5.2 mmol/L 4.8  4.7  4.3   Chloride 96 - 106 mmol/L 106  110  108   CO2 20 - 29 mmol/L 21  20  22    Calcium  8.7 - 10.2 mg/dL 9.3  9.2  9.4   Total Protein 6.0 - 8.5 g/dL 6.9  7.5    Total Bilirubin 0.0 - 1.2 mg/dL 0.2  0.3    Alkaline Phos 44 - 121 IU/L 118  106    AST 0 - 40 IU/L 22  21    ALT 0 - 44 IU/L 22  27     Lipid Panel     Component Value Date/Time   CHOL 126 12/31/2022 1018   TRIG 117 12/31/2022 1018    HDL 42 12/31/2022 1018   CHOLHDL 3.0 12/31/2022 1018   CHOLHDL 3.9 12/13/2016 0421   VLDL 15 12/13/2016 0421   LDLCALC 63 12/31/2022 1018    CBC    Component Value Date/Time   WBC 5.8 12/31/2022 1018   WBC 4.5 04/16/2021 1017   RBC 4.61 12/31/2022 1018   RBC 4.51 04/16/2021 1017   HGB 11.7 (L) 12/31/2022 1018   HGB 14.2 08/02/2008 1026   HCT 37.3 (L) 12/31/2022 1018   HCT 41.6 08/02/2008 1026   PLT 255 12/31/2022 1018   MCV 81 12/31/2022 1018   MCV 78.4 (L) 08/02/2008 1026   MCH 25.4 (L) 12/31/2022 1018   MCH 26.4 (L) 04/16/2021 1017   MCHC 31.4 (L) 12/31/2022 1018   MCHC 33.5 04/16/2021 1017   RDW 15.0 12/31/2022 1018   RDW 16.3 (H) 08/02/2008 1026   LYMPHSABS 1.2 12/31/2022 1018   LYMPHSABS 0.6 (L) 08/02/2008 1026   MONOABS 0.4 02/07/2021 0943   MONOABS 0.2 08/02/2008 1026   EOSABS 0.1 12/31/2022 1018   BASOSABS 0.0 12/31/2022 1018   BASOSABS 0.0 08/02/2008 1026    ASSESSMENT AND PLAN: 1. Type 2 diabetes mellitus with vascular disease (HCC) (Primary)  Glipizide  and Jardiance  regimen  maintained. Recommended blood glucose about 3 times a week which his wife states she can do.  Diabetic testing supplies sent to pharmacy. - POCT glycosylated hemoglobin (Hb A1C) - POCT glucose (manual entry) - glucose blood (ACCU-CHEK GUIDE TEST) test strip; Use as instructed to check Blood sugars daily  Dispense: 100 each; Refill: 12 - Blood Glucose Monitoring Suppl (ACCU-CHEK GUIDE) w/Device KIT; UAD to check blood sugars daily  Dispense: 1 kit; Refill: 0 - Accu-Chek Softclix Lancets lancets; Use as instructed to check blood sugar daily  Dispense: 100 each; Refill: 12 - Ambulatory referral to Ophthalmology - Microalbumin / creatinine urine ratio  2. Diabetes mellitus treated with oral medication (HCC) See #1 above  3. PAF (paroxysmal atrial fibrillation) (HCC) Stable.  On auscultation he is in sinus rhythm.  Continue Eliquis , carvedilol  and Cardizem   4. Benign neoplasm of  meninges, unspecified (HCC) History of meningioma.  Last CT of the head done in 2023 stable.  Patient asymptomatic at this time.  5. Stage 3a chronic kidney disease (HCC) GFR improved.  Avoid NSAIDs. - Basic Metabolic Panel  6. Hypertension associated with type 2 diabetes mellitus (HCC) Not at goal.  Increase Cozaar  to 25 mg daily.  Continue carvedilol  12.5 mg twice a day and Cardizem  30 mg twice a day.  Advised to check blood pressure at least once a week with goal being 130/80 or lower.  7. Hyperlipidemia associated with type 2 diabetes mellitus (HCC) Continue atorvastatin  80 mg daily and Zetia  10 mg daily  8. Chronic anemia Stable - CBC - Iron, TIBC and Ferritin Panel  9. Spastic hemiplegia of left nondominant side as late effect of cerebral infarction (HCC) Continue atorvastatin , Zetia  and Eliquis  Seen by PMR and is on dantrolene  and Xeomin  inj   10. Dry skin Encourage patient to soak feet in warm water for 10 minutes every day to help soften up on expelled dry skin of his feet  11. Need for vaccination against Streptococcus pneumoniae - Pneumococcal conjugate vaccine 20-valent  12. Herpes zoster vaccination declined Recommended.  Patient and wife declined.  Patient was given the opportunity to ask questions.  Patient verbalized understanding of the plan and was able to repeat key elements of the plan.   This documentation was completed using Paediatric nurse.  Any transcriptional errors are unintentional.  Orders Placed This Encounter  Procedures   Pneumococcal conjugate vaccine 20-valent   CBC   Iron, TIBC and Ferritin Panel   Microalbumin / creatinine urine ratio   Basic Metabolic Panel   Ambulatory referral to Ophthalmology   POCT glycosylated hemoglobin (Hb A1C)   POCT glucose (manual entry)     Requested Prescriptions   Signed Prescriptions Disp Refills   glucose blood (ACCU-CHEK GUIDE TEST) test strip 100 each 12    Sig: Use as  instructed to check Blood sugars daily   Blood Glucose Monitoring Suppl (ACCU-CHEK GUIDE) w/Device KIT 1 kit 0    Sig: UAD to check blood sugars daily   Accu-Chek Softclix Lancets lancets 100 each 12    Sig: Use as instructed to check blood sugar daily    Return in about 4 months (around 11/01/2023).  Concetta Dee, MD, FACP

## 2023-07-02 NOTE — Patient Instructions (Signed)
 VISIT SUMMARY:  Today, you had a follow-up visit to manage your diabetes, hypertension, congestive heart failure, and other health conditions. Your blood sugar and kidney function have shown improvement, and we discussed adjustments to your medications and monitoring routines. You were accompanied by your wife.  YOUR PLAN:  -TYPE 2 DIABETES MELLITUS: Your diabetes is well-controlled with an A1c of 6.5%. We will continue your current medications, Glipizide  and Jardiance . To better manage your condition, we will provide you with diabetic testing supplies so you can monitor your blood sugar at home three times a week. Additionally, we will refer you for a diabetic eye exam to check for any complications related to diabetes.  -HYPERTENSION: Your blood pressure is currently elevated at 155/84 mmHg. To help lower it to our goal of 130/80 mmHg or lower, we are increasing your losartan  dose to 25 mg daily. Please check your blood pressure at home once a week to monitor your progress.  -CHRONIC KIDNEY DISEASE: Your kidney function has improved, with your GFR increasing from 39 to 55. To maintain this improvement, it is important to avoid NSAIDs like ibuprofen, naproxen, and Advil, as they can harm your kidneys. If you need pain relief, use acetaminophen  instead.  -ATRIAL FIBRILLATION: Your atrial fibrillation is being managed with Eliquis  and carvedilol , and you have not reported any symptoms. We will continue with your current treatment plan.  -ANEMIA: Your anemia is stable, but you have not been taking iron supplements recently. We will order lab tests to check your blood count and iron levels to ensure everything remains stable.  -HYPERLIPIDEMIA: Your cholesterol is being managed with atorvastatin  and Zetia . We will continue with your current medications.  -MENINGIOMA: You have a low calcified tumor that has not caused any symptoms. We will continue to monitor it.  -SEIZURE HISTORY: You have a history  of a one-time seizure and have not had any seizures since. No further action is needed at this time.  -GENERAL HEALTH MAINTENANCE: You are due for the pneumonia vaccine, which we will administer today. You declined the shingles vaccine, but it is recommended by the CDC. We will also schedule your Medicare wellness visit for July 28, 2023.  INSTRUCTIONS:  Please follow up with the lab tests to check your blood count and iron levels. Monitor your blood pressure at home once a week and your blood sugar three times a week. Attend your diabetic eye exam once it is scheduled. Your next Medicare wellness visit is scheduled for July 28, 2023.

## 2023-07-02 NOTE — ED Triage Notes (Signed)
 Pt fell in restroom at [employer]. Pt had bleeding from left ear. Has bandage from restaurant that is controlling bleeding at this time. Reports ear pain denies LOC. Pt does take Eliquis .

## 2023-07-04 ENCOUNTER — Ambulatory Visit: Payer: Self-pay | Admitting: Internal Medicine

## 2023-07-04 LAB — CBC
Hematocrit: 40 % (ref 37.5–51.0)
Hemoglobin: 13 g/dL (ref 13.0–17.7)
MCH: 26.9 pg (ref 26.6–33.0)
MCHC: 32.5 g/dL (ref 31.5–35.7)
MCV: 83 fL (ref 79–97)
Platelets: 215 10*3/uL (ref 150–450)
RBC: 4.84 x10E6/uL (ref 4.14–5.80)
RDW: 16 % — ABNORMAL HIGH (ref 11.6–15.4)
WBC: 4.3 10*3/uL (ref 3.4–10.8)

## 2023-07-04 LAB — IRON,TIBC AND FERRITIN PANEL
Ferritin: 54 ng/mL (ref 30–400)
Iron Saturation: 12 % — ABNORMAL LOW (ref 15–55)
Iron: 30 ug/dL — ABNORMAL LOW (ref 38–169)
Total Iron Binding Capacity: 243 ug/dL — ABNORMAL LOW (ref 250–450)
UIBC: 213 ug/dL (ref 111–343)

## 2023-07-04 LAB — BASIC METABOLIC PANEL WITH GFR
BUN/Creatinine Ratio: 12 (ref 9–20)
BUN: 20 mg/dL (ref 6–24)
CO2: 20 mmol/L (ref 20–29)
Calcium: 9.3 mg/dL (ref 8.7–10.2)
Chloride: 110 mmol/L — ABNORMAL HIGH (ref 96–106)
Creatinine, Ser: 1.67 mg/dL — ABNORMAL HIGH (ref 0.76–1.27)
Glucose: 114 mg/dL — ABNORMAL HIGH (ref 70–99)
Potassium: 4.5 mmol/L (ref 3.5–5.2)
Sodium: 147 mmol/L — ABNORMAL HIGH (ref 134–144)
eGFR: 47 mL/min/{1.73_m2} — ABNORMAL LOW (ref >59)

## 2023-07-04 LAB — MICROALBUMIN / CREATININE URINE RATIO
Creatinine, Urine: 69.6 mg/dL
Microalb/Creat Ratio: 129 mg/g{creat} — ABNORMAL HIGH (ref 0–29)
Microalbumin, Urine: 89.9 ug/mL

## 2023-07-05 ENCOUNTER — Other Ambulatory Visit: Payer: Self-pay | Admitting: Internal Medicine

## 2023-07-05 DIAGNOSIS — I152 Hypertension secondary to endocrine disorders: Secondary | ICD-10-CM

## 2023-07-05 DIAGNOSIS — I480 Paroxysmal atrial fibrillation: Secondary | ICD-10-CM

## 2023-07-05 MED ORDER — LOSARTAN POTASSIUM 25 MG PO TABS: 25.0000 mg | ORAL_TABLET | Freq: Every day | ORAL | 1 refills | Status: AC

## 2023-07-05 MED ORDER — DILTIAZEM HCL 30 MG PO TABS: ORAL_TABLET | ORAL | 1 refills | Status: DC

## 2023-07-28 ENCOUNTER — Ambulatory Visit: Payer: Medicare HMO | Attending: Internal Medicine

## 2023-07-28 VITALS — Ht 64.0 in

## 2023-07-28 DIAGNOSIS — Z0000 Encounter for general adult medical examination without abnormal findings: Secondary | ICD-10-CM

## 2023-07-28 NOTE — Progress Notes (Signed)
 Because this visit was a virtual/telehealth visit,  certain criteria was not obtained, such a blood pressure, CBG if applicable, and timed get up and go. Any medications not marked as taking were not mentioned during the medication reconciliation part of the visit. Any vitals not documented were not able to be obtained due to this being a telehealth visit or patient was unable to self-report a recent blood pressure reading due to a lack of equipment at home via telehealth. Vitals that have been documented are verbally provided by the patient.   Subjective:   [name] is a 58 y.o. who presents for a Medicare Wellness preventive visit.  As a reminder, Annual Wellness Visits don't include a physical exam, and some assessments may be limited, especially if this visit is performed virtually. We may recommend an in-person follow-up visit with your provider if needed.  Visit Complete: Virtual I connected with  [name] on 07/28/23 by a audio enabled telemedicine application and verified that I am speaking with the correct person using two identifiers.  Patient Location: Home  Provider Location: Office/Clinic  I discussed the limitations of evaluation and management by telemedicine. The patient expressed understanding and agreed to proceed.  Vital Signs: Because this visit was a virtual/telehealth visit, some criteria may be missing or patient reported. Any vitals not documented were not able to be obtained and vitals that have been documented are patient reported.  VideoDeclined- This patient declined Librarian, academic. Therefore the visit was completed with audio only.  Persons Participating in Visit: Patient.  AWV Questionnaire: No: Patient Medicare AWV questionnaire was not completed prior to this visit.  Cardiac Risk Factors include: advanced age (>44men, >60 women);sedentary lifestyle;diabetes mellitus;hypertension;male gender;family history  of premature cardiovascular disease     Objective:    Today's Vitals   07/28/23 1007  Height: 5' 4 (1.626 m)  PainSc: 0-No pain   Body mass index is 34.33 kg/m.     07/28/2023   10:09 AM 07/02/2023    2:36 PM 07/22/2022   10:15 AM 11/26/2020   12:42 PM 11/23/2020    6:13 PM 11/14/2020    5:29 AM 11/13/2020    9:12 PM  Advanced Directives  Does Patient Have a Medical Advance Directive? No No No No No No No  Would patient like information on creating a medical advance directive? No - Patient declined  Yes (MAU/Ambulatory/Procedural Areas - Information given) Yes (Inpatient - patient defers creating a medical advance directive at this time - Information given) Yes (Inpatient - patient requests chaplain consult to create a medical advance directive) Yes (Inpatient - patient requests chaplain consult to create a medical advance directive) No - Patient declined    Current Medications (verified) Outpatient Encounter Medications as of 07/28/2023  Medication Sig   Accu-Chek Softclix Lancets lancets Use as instructed to check blood sugar daily   apixaban  (ELIQUIS ) 5 MG TABS tablet Take 1 tablet (5 mg total) by mouth 2 (two) times daily.   atorvastatin  (LIPITOR ) 80 MG tablet Take 1 tablet (80 mg total) by mouth daily.   Blood Glucose Monitoring Suppl (ACCU-CHEK GUIDE) w/Device KIT UAD to check blood sugars daily   dantrolene  (DANTRIUM ) 100 MG capsule Take 1 capsule (100 mg total) by mouth 2 (two) times daily. To take due to severe spasticity from stroke in L side- cannot take Baclofen due to renal failure and zanaflex not ideal either   diltiazem  (CARDIZEM ) 30 MG tablet TAKE 1 TABLET BY MOUTH  TWICE A DAY. Must have office visit for refills   empagliflozin  (JARDIANCE ) 10 MG TABS tablet Take 1 tablet (10 mg total) by mouth daily before breakfast.   ezetimibe  (ZETIA ) 10 MG tablet Take 1 tablet (10 mg total) by mouth daily.   ferrous sulfate  325 (65 FE) MG tablet TAKE 1 TABLET BY MOUTH EVERY MONDAY  WEDNESDAY AND FRIDAY (Patient not taking: Reported on 07/02/2023)   glipiZIDE  (GLUCOTROL ) 5 MG tablet Take 1 tablet (5 mg total) by mouth daily before breakfast. Must have office visit for refills   glucose blood (ACCU-CHEK GUIDE TEST) test strip Use as instructed to check Blood sugars daily   losartan  (COZAAR ) 25 MG tablet Take 1 tablet (25 mg total) by mouth daily.   pantoprazole  (PROTONIX ) 40 MG tablet Take 1 tablet (40 mg total) by mouth daily.   solifenacin  (VESICARE ) 10 MG tablet Take 1 tablet (10 mg total) by mouth daily. For bladder issues related to stroke   No facility-administered encounter medications on file as of 07/28/2023.    Allergies (verified) Patient has no known allergies.   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 ENDOSCOPY;  Service: Gastroenterology;  Laterality: N/A;   ESOPHAGOGASTRODUODENOSCOPY (EGD) WITH PROPOFOL  N/A 11/18/2020   Procedure: ESOPHAGOGASTRODUODENOSCOPY (EGD) WITH PROPOFOL ;  Surgeon: Federico Rosario BROCKS, MD;  Location: [facility] ENDOSCOPY;  Service: Gastroenterology;  Laterality: N/A;   JOINT REPLACEMENT Left 2004   Left hip   POLYPECTOMY  11/18/2020   Procedure: POLYPECTOMY;  Surgeon: Federico Rosario BROCKS, MD;  Location: [facility] ENDOSCOPY;  Service: Gastroenterology;;   TOTAL HIP ARTHROPLASTY      Family History  Problem Relation Age of Onset   Hypertension Mother    Heart disease Mother    Hyperlipidemia Mother    Heart failure Mother    Hypertension Sister    Hypertension Brother    Stroke Father    Hypertension Father    Colon cancer Maternal Grandmother    Diabetes Maternal Grandmother    Esophageal cancer Neg Hx    Social History   Socioeconomic History   Marital status: Married    Spouse name: [name]   Number of children: 1   Years of education: 16   Highest education level: Not on file  Occupational History   Occupation: disabled  Tobacco Use   Smoking status: Never   Smokeless tobacco: Never  Vaping Use   Vaping status: Never Used  Substance and Sexual Activity   Alcohol use: Not Currently   Drug use: No   Sexual activity: Not Currently  Other Topics Concern   Not on file  Social History Narrative   Patient drinks caffeine a couple times a week.   Patient is right handed.   Lives with wife [name]) and daughter   Social  Drivers of Health   Financial Resource Strain: Low Risk  (07/28/2023)   Overall Financial Resource Strain (CARDIA)    Difficulty of Paying Living Expenses: Not hard at all  Food Insecurity: No Food Insecurity (07/28/2023)   Hunger Vital Sign    Worried About Running Out of Food in the Last Year: Never true    Ran Out of Food in the Last Year: Never true  Transportation Needs: No Transportation Needs (07/28/2023)   PRAPARE - Administrator, Civil Service (Medical): No    Lack of Transportation (Non-Medical): No  Physical Activity: Inactive (07/28/2023)   Exercise Vital Sign    Days of Exercise per Week: 0 days    Minutes of Exercise per Session: 0 min  Stress: No Stress Concern Present (07/28/2023)   [employer] of Occupational Health - Occupational Stress Questionnaire    Feeling of Stress: Not at all  Social Connections: Moderately Integrated (07/28/2023)   Social Connection and Isolation Panel     Frequency of Communication with Friends and Family: Once a week    Frequency of Social Gatherings with Friends and Family: Once a week    Attends Religious Services: More than 4 times per year    Active Member of [employer] or Organizations: Yes    Attends Banker Meetings: 1 to 4 times per year    Marital Status: Married    Tobacco Counseling Counseling given: Not Answered    Clinical Intake:  Pre-visit preparation completed: Yes  Pain : No/denies pain Pain Score: 0-No pain     Nutritional Risks: None Diabetes: No  Lab Results  Component Value Date   HGBA1C 6.5 07/02/2023   HGBA1C 6.7 12/31/2022   HGBA1C 6.2 04/17/2022     How often do you need to have someone help you when you read instructions, pamphlets, or other written materials from your doctor or pharmacy?: 1 - Never  Interpreter Needed?: No  Information entered by :: Mark Crooke N. Presly Steinruck, LPN.   Activities of Daily Living     07/28/2023   10:12 AM  In your present state of health, do you have any difficulty performing the following activities:  Hearing? 0  Vision? 0  Difficulty concentrating or making decisions? 0  Walking or climbing stairs? 1  Dressing or bathing? 0  Doing errands, shopping? 0  Preparing Food and eating ? N  Using the Toilet? N  In the past six months, have you accidently leaked urine? N  Do you have problems with loss of bowel control? N  Managing your Medications? N  Managing your Finances? N  Housekeeping or managing your Housekeeping? Y    Patient Care Team: Vicci Barnie NOVAK, MD as PCP - General (Internal Medicine) Gillie Duncans, MD as Consulting Physician (Neurosurgery)  I have updated your Care Teams any recent Medical Services you may have received from other providers in the past year.     Assessment:   This is a routine wellness examination for [name].  Hearing/Vision screen Hearing Screening - Comments:: Denies hearing difficulties.  Vision Screening  - Comments:: Wears rx glasses - not up to date with routine eye exams.    Goals Addressed             This Visit's Progress    Increase mobility and decrease spasticity         Depression Screen     07/28/2023   10:12 AM 07/02/2023   10:05 AM 07/02/2023   10:04 AM 03/30/2023  9:44 AM 02/10/2023   10:42 AM 01/02/2023    1:35 PM 12/31/2022    9:32 AM  PHQ 2/9 Scores  PHQ - 2 Score 0 0 0 2 0 0 0  PHQ- 9 Score 0 0     0    Fall Risk     07/28/2023   10:09 AM 07/02/2023   10:05 AM 03/30/2023    9:44 AM 02/10/2023   10:41 AM 01/02/2023    1:34 PM  Fall Risk   Falls in the past year? 0 0 0 Exclusion - non ambulatory 0  Number falls in past yr: 0 0 0  0  Injury with Fall? 0 0 0    Risk for fall due to : No Fall Risks No Fall Risks  Impaired mobility Impaired balance/gait  Follow up Falls evaluation completed Falls evaluation completed       MEDICARE RISK AT HOME:  Medicare Risk at Home Any stairs in or around the home?: No If so, are there any without handrails?: No Home free of loose throw rugs in walkways, pet beds, electrical cords, etc?: Yes Adequate lighting in your home to reduce risk of falls?: Yes Life alert?: Yes Use of a cane, walker or w/c?: Yes Grab bars in the bathroom?: Yes Shower chair or bench in shower?: Yes Elevated toilet seat or a handicapped toilet?: Yes  TIMED UP AND GO:  Was the test performed?  No  Cognitive Function: Declined/Normal: No cognitive concerns noted by patient or family. Patient alert, oriented, able to answer questions appropriately and recall recent events. No signs of memory loss or confusion.    07/28/2023   10:11 AM 07/25/2020   12:19 PM  MMSE - Mini Mental State Exam  Not completed: Unable to complete   Orientation to time  5  Orientation to Place  5  Registration  3  Attention/ Calculation  5  Recall  3  Language- name 2 objects  2  Language- repeat  1  Language- follow 3 step command  3  Language- read & follow  direction  1  Write a sentence  1  Copy design  1  Total score  30        07/28/2023   10:22 AM 07/22/2022   10:43 AM  6CIT Screen  What Year? 0 points 0 points  What month? 0 points 0 points  What time? 0 points 0 points  Count back from 20 0 points 0 points  Months in reverse 0 points 0 points  Repeat phrase 0 points 0 points  Total Score 0 points 0 points    Immunizations Immunization History  Administered Date(s) Administered   Influenza, Seasonal, Injecte, Preservative Fre 12/31/2022   Influenza,inj,Quad PF,6+ Mos 11/11/2018, 10/15/2020   PFIZER(Purple Top)SARS-COV-2 Vaccination 04/15/2019, 05/10/2019   PNEUMOCOCCAL CONJUGATE-20 07/02/2023   Pfizer Covid-19 Vaccine Bivalent Booster 7yrs & up 02/19/2021   Pneumococcal Polysaccharide-23 07/26/2014   Tdap 11/11/2018    Screening Tests Health Maintenance  Topic Date Due   Hepatitis B Vaccines (1 of 3 - 19+ 3-dose series) Never done   COVID-19 Vaccine (4 - 2024-25 season) 09/14/2022   OPHTHALMOLOGY EXAM  01/10/2023   Zoster Vaccines- Shingrix (1 of 2) 07/01/2024 (Originally 03/05/2016)   INFLUENZA VACCINE  08/14/2023   HEMOGLOBIN A1C  01/01/2024   Diabetic kidney evaluation - eGFR measurement  07/01/2024   Diabetic kidney evaluation - Urine ACR  07/01/2024   FOOT EXAM  07/01/2024   Medicare Annual Wellness (AWV)  07/27/2024   DTaP/Tdap/Td (2 - Td or Tdap) 11/10/2028   Colonoscopy  11/19/2030   Pneumococcal Vaccine 83-83 Years old  Completed   Hepatitis C Screening  Completed   HIV Screening  Completed   HPV VACCINES  Aged Out   Meningococcal B Vaccine  Aged Out    Health Maintenance  Health Maintenance Due  Topic Date Due   Hepatitis B Vaccines (1 of 3 - 19+ 3-dose series) Never done   COVID-19 Vaccine (4 - 2024-25 season) 09/14/2022   OPHTHALMOLOGY EXAM  01/10/2023   Health Maintenance Items Addressed: Yes Patient aware of current care gaps.  Immunization record was verified by [employer].    Additional  Screening:  Vision Screening: Recommended annual ophthalmology exams for early detection of glaucoma and other disorders of the eye. Would you like a referral to an eye doctor? No referral placed at last office visit.   Dental Screening: Recommended annual dental exams for proper oral hygiene  Community Resource Referral / Chronic Care Management: CRR required this visit?  No   CCM required this visit?  No   Plan:    I have personally reviewed and noted the following in the patient's chart:   Medical and social history Use of alcohol, tobacco or illicit drugs  Current medications and supplements including opioid prescriptions. Patient is not currently taking opioid prescriptions. Functional ability and status Nutritional status Physical activity Advanced directives List of other physicians Hospitalizations, surgeries, and ER visits in previous 12 months Vitals Screenings to include cognitive, depression, and falls Referrals and appointments  In addition, I have reviewed and discussed with patient certain preventive protocols, quality metrics, and best practice recommendations. A written personalized care plan for preventive services as well as general preventive health recommendations were provided to patient.   Mark LOISE Fuller, LPN   [id number]/84/7974   After Visit Summary: (MyChart) Due to this being a telephonic visit, the after visit summary with patients personalized plan was offered to patient via MyChart   Notes: Patient aware of current care gaps.  Immunization record was verified by [employer].

## 2023-07-28 NOTE — Patient Instructions (Addendum)
 Mr. Anglin , Thank you for taking time out of your busy schedule to complete your Annual Wellness Visit with me. I enjoyed our conversation and look forward to speaking with you again next year. I, as well as your care team,  appreciate your ongoing commitment to your health goals. Please review the following plan we discussed and let me know if I can assist you in the future. Your Game plan/ To Do List    Referrals: If you haven't heard from the office you've been referred to, please reach out to them at the phone provided.   Follow up Visits: Next Medicare AWV with our clinical staff:  08/02/2024 at 9:50 a.m. phone visit with Nurse  Have you seen your provider in the last 6 months (3 months if uncontrolled diabetes)? Yes Next Office Visit with your provider: 11/02/2023 at 8:50 a.m. office visit with Dr. Vicci  Clinician Recommendations:  Aim for 30 minutes of exercise or brisk walking, 6-8 glasses of water, and 5 servings of fruits and vegetables each day.       This is a list of the screening recommended for you and due dates:  Health Maintenance  Topic Date Due   Hepatitis B Vaccine (1 of 3 - 19+ 3-dose series) Never done   COVID-19 Vaccine (4 - 2024-25 season) 09/14/2022   Eye exam for diabetics  01/10/2023   Zoster (Shingles) Vaccine (1 of 2) 07/01/2024*   Flu Shot  08/14/2023   Hemoglobin A1C  01/01/2024   Yearly kidney function blood test for diabetes  07/01/2024   Yearly kidney health urinalysis for diabetes  07/01/2024   Complete foot exam   07/01/2024   Medicare Annual Wellness Visit  07/27/2024   DTaP/Tdap/Td vaccine (2 - Td or Tdap) 11/10/2028   Colon Cancer Screening  11/19/2030   Pneumococcal Vaccination  Completed   Hepatitis C Screening  Completed   HIV Screening  Completed   HPV Vaccine  Aged Out   Meningitis B Vaccine  Aged Out  *Topic was postponed. The date shown is not the original due date.    Advanced directives: (Declined) Advance directive discussed  with you today. Even though you declined this today, please call our office should you change your mind, and we can give you the proper paperwork for you to fill out. Advance Care Planning is important because it:  [x]  Makes sure you receive the medical care that is consistent with your values, goals, and preferences  [x]  It provides guidance to your family and loved ones and reduces their decisional burden about whether or not they are making the right decisions based on your wishes.  Follow the link provided in your after visit summary or read over the paperwork we have mailed to you to help you started getting your Advance Directives in place. If you need assistance in completing these, please reach out to us  so that we can help you!  See attachments for Preventive Care and Fall Prevention Tips.

## 2023-08-12 ENCOUNTER — Other Ambulatory Visit: Payer: Self-pay | Admitting: Critical Care Medicine

## 2023-08-12 DIAGNOSIS — K2210 Ulcer of esophagus without bleeding: Secondary | ICD-10-CM

## 2023-10-11 ENCOUNTER — Other Ambulatory Visit: Payer: Self-pay | Admitting: Critical Care Medicine

## 2023-10-11 DIAGNOSIS — E1159 Type 2 diabetes mellitus with other circulatory complications: Secondary | ICD-10-CM

## 2023-10-12 ENCOUNTER — Encounter: Payer: Self-pay | Admitting: Physical Medicine and Rehabilitation

## 2023-10-12 ENCOUNTER — Encounter: Attending: Physical Medicine and Rehabilitation | Admitting: Physical Medicine and Rehabilitation

## 2023-10-12 VITALS — BP 166/94 | HR 81 | Ht 64.0 in | Wt 200.0 lb

## 2023-10-12 DIAGNOSIS — E1159 Type 2 diabetes mellitus with other circulatory complications: Secondary | ICD-10-CM

## 2023-10-12 DIAGNOSIS — Z794 Long term (current) use of insulin: Secondary | ICD-10-CM | POA: Diagnosis not present

## 2023-10-12 DIAGNOSIS — I69354 Hemiplegia and hemiparesis following cerebral infarction affecting left non-dominant side: Secondary | ICD-10-CM | POA: Diagnosis not present

## 2023-10-12 DIAGNOSIS — I6930 Unspecified sequelae of cerebral infarction: Secondary | ICD-10-CM | POA: Diagnosis not present

## 2023-10-12 DIAGNOSIS — D332 Benign neoplasm of brain, unspecified: Secondary | ICD-10-CM

## 2023-10-12 MED ORDER — DANTROLENE SODIUM 100 MG PO CAPS: 100.0000 mg | ORAL_CAPSULE | Freq: Two times a day (BID) | ORAL | 1 refills | Status: DC

## 2023-10-12 MED ORDER — DIAZEPAM 5 MG PO TABS: 5.0000 mg | ORAL_TABLET | ORAL | 0 refills | Status: AC

## 2023-10-12 NOTE — Progress Notes (Signed)
 Subjective:    Patient ID: [name], male    DOB: [birth year], 58 y.o.   MRN: [id number]  HPI Pt is a 58  yr old male with previous R brainstem  CVA with L hemiplegia- along with R medial brain tumor; with recent new onset Afib, and worsening spasticity; sacral unstagable ulcer- that has resolved- ; gout flare- resolved; Acute on chornic AKI/CKD 3; DM with A1c of 6.7  Here for f/u on CVA.    DM is doing pretty good-  haven't been checking BG's- Last A1c 6.5 in 06/2023.      Cannot tell if tighter or not since missed Xeomin  injection 05/14/23 with Dr Carilyn.    Spasticity in general is good.  Not painful-  Still using W/C-  to get around even in house.   Nothing new!  Doesn't think has lost any more range on L side.   Last got Xeomin  in January- 02/10/23- was helpful when last had it.   Still wearing a night splint on LUE.  Cannot transport power w/c, so brings manual w/c.    Frustrated that takes multiple meds- and wants to get off some.  Still taking Vesicare  for bladder- still needs that as well as GERD meds.    Pain Inventory Average Pain 0 Pain Right Now 0 My pain is NO PAIN  In the last 24 hours, has pain interfered with the following? General activity 0 Relation with others 0 Enjoyment of life 0 What TIME of day is your pain at its worst? No pain Sleep (in general) Good  Pain is worse with: no pain Pain improves with: no pain Relief from Meds: no pain  Family History  Problem Relation Age of Onset   Hypertension Mother    Heart disease Mother    Hyperlipidemia Mother    Heart failure Mother    Hypertension Sister    Hypertension Brother    Stroke Father    Hypertension Father    Colon cancer Maternal Grandmother    Diabetes Maternal Grandmother    Esophageal cancer Neg Hx    Social History   Socioeconomic History   Marital status: Married    Spouse name: Rosaline   Number of children: 1   Years of education: 16   Highest education  level: Not on file  Occupational History   Occupation: disabled  Tobacco Use   Smoking status: Never   Smokeless tobacco: Never  Vaping Use   Vaping status: Never Used  Substance and Sexual Activity   Alcohol use: Not Currently   Drug use: No   Sexual activity: Not Currently  Other Topics Concern   Not on file  Social History Narrative   Patient drinks caffeine a couple times a week.   Patient is right handed.   Lives with wife Everlina) and daughter   Social Drivers of Health   Financial Resource Strain: Low Risk  (07/28/2023)   Overall Financial Resource Strain (CARDIA)    Difficulty of Paying Living Expenses: Not hard at all  Food Insecurity: No Food Insecurity (07/28/2023)   Hunger Vital Sign    Worried About Running Out of Food in the Last Year: Never true    Ran Out of Food in the Last Year: Never true  Transportation Needs: No Transportation Needs (07/28/2023)   PRAPARE - Administrator, Civil Service (Medical): No    Lack of Transportation (Non-Medical): No  Physical Activity: Inactive (07/28/2023)   Exercise Vital Sign  Days of Exercise per Week: 0 days    Minutes of Exercise per Session: 0 min  Stress: No Stress Concern Present (07/28/2023)   [employer] of Occupational Health - Occupational Stress Questionnaire    Feeling of Stress: Not at all  Social Connections: Moderately Integrated (07/28/2023)   Social Connection and Isolation Panel    Frequency of Communication with Friends and Family: Once a week    Frequency of Social Gatherings with Friends and Family: Once a week    Attends Religious Services: More than 4 times per year    Active Member of Clubs or Organizations: Yes    Attends Banker Meetings: 1 to 4 times per year    Marital Status: Married   Past Surgical History:  Procedure Laterality Date   APPENDECTOMY     BIOPSY  11/18/2020   Procedure: BIOPSY;  Surgeon: Federico Rosario BROCKS, MD;  Location: [facility] ENDOSCOPY;  Service:  Gastroenterology;;   COLONOSCOPY N/A 11/18/2020   Procedure: COLONOSCOPY;  Surgeon: Federico Rosario BROCKS, MD;  Location: [facility] ENDOSCOPY;  Service: Gastroenterology;  Laterality: N/A;   ESOPHAGOGASTRODUODENOSCOPY (EGD) WITH PROPOFOL  N/A 11/18/2020   Procedure: ESOPHAGOGASTRODUODENOSCOPY (EGD) WITH PROPOFOL ;  Surgeon: Federico Rosario BROCKS, MD;  Location: [facility] ENDOSCOPY;  Service: Gastroenterology;  Laterality: N/A;   JOINT REPLACEMENT Left 2004   Left hip   POLYPECTOMY  11/18/2020   Procedure: POLYPECTOMY;  Surgeon: Federico Rosario BROCKS, MD;  Location: [facility] ENDOSCOPY;  Service: Gastroenterology;;   TOTAL HIP ARTHROPLASTY     Past Surgical History:  Procedure Laterality Date   APPENDECTOMY     BIOPSY  11/18/2020   Procedure: BIOPSY;  Surgeon: Federico Rosario BROCKS, MD;  Location: [facility] ENDOSCOPY;  Service: Gastroenterology;;   COLONOSCOPY N/A 11/18/2020   Procedure: COLONOSCOPY;  Surgeon: Federico Rosario BROCKS, MD;  Location: [facility] ENDOSCOPY;  Service: Gastroenterology;  Laterality: N/A;   ESOPHAGOGASTRODUODENOSCOPY (EGD) WITH PROPOFOL  N/A 11/18/2020   Procedure: ESOPHAGOGASTRODUODENOSCOPY (EGD) WITH PROPOFOL ;  Surgeon: Federico Rosario BROCKS, MD;  Location: [facility] ENDOSCOPY;  Service: Gastroenterology;  Laterality: N/A;   JOINT REPLACEMENT Left 2004   Left hip   POLYPECTOMY  11/18/2020   Procedure: POLYPECTOMY;  Surgeon: Federico Rosario BROCKS, MD;  Location: [facility] ENDOSCOPY;  Service: Gastroenterology;;   TOTAL HIP ARTHROPLASTY     Past Medical History:  Diagnosis Date   Diabetes mellitus without complication (HCC)    Gait abnormality 03/31/2018   Hemiparesis and speech and language deficit as late effects of stroke (HCC) 03/31/2018   Hypertension    Osteomyelitis of coccyx (HCC) 02/19/2021   Pressure injury of skin 11/26/2020   Rectal cancer (HCC) 2009   rectal   Sacral osteomyelitis (HCC) 02/19/2021   Seizure (HCC)    Stroke (HCC) 2018   BP (!) 166/94   Pulse 81   Ht 5' 4 (1.626 m)   SpO2 97%   BMI 34.33 kg/m   Opioid Risk Score:   Fall  Risk Score:  `1  Depression screen [facility] 2/9     10/12/2023    8:55 AM 07/28/2023   10:12 AM 07/02/2023   10:05 AM 07/02/2023   10:04 AM 03/30/2023    9:44 AM 02/10/2023   10:42 AM 01/02/2023    1:35 PM  Depression screen PHQ 2/9  Decreased Interest 0 0 0 0 1 0 0  Down, Depressed, Hopeless 0 0 0 0 1 0 0  PHQ - 2 Score 0 0 0 0 2 0 0  Altered sleeping  0 0  Tired, decreased energy  0 0      Change in appetite  0 0      Feeling bad or failure about yourself   0 0      Trouble concentrating  0 0      Moving slowly or fidgety/restless  0 0      Suicidal thoughts  0 0      PHQ-9 Score  0 0      Difficult doing work/chores  Not difficult at all Not difficult at all         Review of Systems  All other systems reviewed and are negative.      Objective:   Physical Exam  Awake, alert, appropriate, flat/quiet affect, accompanied by his wife, NAD In manual w/c-  Needed to go to bathroom in middle of appointment.  MSK: LUE- Deltoid 2/5; Biceps/triceps 2 to 2-/5; WE 2-/5 Grip 2-/5 and FA 0-1/5 LLE-  Forming contracture of PIP on L 5th digit Lacking L knee extension- 20-30 degrees- losing more ROM Lacking L shoulder external rotation ROM- can't even do 5 degrees Losing ROM of fingers in extension  Neuro: MAS of 2 in LUE except fingers MAS of 3 MAS of 2 in LLE- esp hip abduction     Assessment & Plan:   Pt is a 58  yr old male with previous R brainstem  CVA 11/2016 with L hemiplegia- along with  R brain calcified brain tumor-  with recent new onset Afib, and worsening spasticity; sacral unstagable ulcer that has resolved-; gout flare- resolved; Acute on chronic AKI/CKD 3; DM with A1c of 6.7  Here for f/u on CVA.    Its a good habit to get into to check your BG's a few days/week. Check before you eat- also check  if you feel bad- lightheaded, dizzy, etc. .   2.  We went over his meds- although I cannot tell you what your PCP will say, I don't see any meds you can just come off  of.  I'm sorry.   3. Had micro-hemorrhages- when had brain stem stroke so I'm not sure Vivastem is possible for LUE strength- esp with  benign brain tumor-   4. I think we need to get a f/u Brain MRI due to calcified  brain mass- medial R hemisphere- since has been 3 years-  it's assumed benign since it's slow growing, however no brain tumor is truly benign since if grows, can compress brain tissue- has been 3 years since we assessed with MRI- will order another to f/u. Of note, pt's strength in LUE is worse than last check.   5. Will reschedule for pt to see Dr Carilyn- for Xeomin  dosing of LUE. 300 units LUE  6. Will order Valium for MRI 5 mg x2 max for shaking  7.  Need to lay in bed and try to fully extend L knee as much as possible to try and help reduce forming contracture.    8. F/U with me in 3 months-  and schedule to see Dr Carilyn- losing ROM of fingers esp     I spent a total of 40   minutes on total care today- >50% coordination of care- due to d/w pt about brain tumor and needs recheck- also Xeomin  botulinum toxin since losing ROM- and about spasticity in general- and to not miss any more appts

## 2023-10-12 NOTE — Patient Instructions (Signed)
 Pt is a 58  yr old male with previous R brainstem  CVA 11/2016 with L hemiplegia- along with  R brain calcified brain tumor-  with recent new onset Afib, and worsening spasticity; sacral unstagable ulcer that has resolved-; gout flare- resolved; Acute on chronic AKI/CKD 3; DM with A1c of 6.7  Here for f/u on CVA.    Its a good habit to get into to check your BG's a few days/week. Check before you eat- also check  if you feel bad- lightheaded, dizzy, etc. .   2.  We went over his meds- although I cannot tell you what your PCP will say, I don't see any meds you can just come off of.  I'm sorry.   3. Had micro-hemorrhages- when had brain stem stroke so I'm not sure Vivastem is possible for LUE strength- esp with  benign brain tumor-   4. I think we need to get a f/u Brain MRI due to calcified  brain mass- medial R hemisphere- since has been 3 years-  it's assumed benign since it's slow growing, however no brain tumor is truly benign since if grows, can compress brain tissue- has been 3 years since we assessed with MRI- will order another to f/u. Of note, pt's strength in LUE is worse than last check.   5. Will reschedule for pt to see Dr Carilyn- for Xeomin  dosing of LUE. 300 units LUE  6. Will order Valium for MRI 5 mg x2 max for shaking  7.  Need to lay in bed and try to fully extend L knee as much as possible to try and help reduce forming contracture.    8. F/U with me in 3 months-  and schedule to see Dr Carilyn- losing ROM of fingers esp

## 2023-10-13 LAB — COMPREHENSIVE METABOLIC PANEL WITH GFR
ALT: 12 IU/L (ref 0–44)
AST: 11 IU/L (ref 0–40)
Albumin: 4.2 g/dL (ref 3.8–4.9)
Alkaline Phosphatase: 100 IU/L (ref 47–123)
BUN/Creatinine Ratio: 14 (ref 9–20)
BUN: 21 mg/dL (ref 6–24)
Bilirubin Total: 0.3 mg/dL (ref 0.0–1.2)
CO2: 20 mmol/L (ref 20–29)
Calcium: 9.3 mg/dL (ref 8.7–10.2)
Chloride: 103 mmol/L (ref 96–106)
Creatinine, Ser: 1.48 mg/dL — ABNORMAL HIGH (ref 0.76–1.27)
Globulin, Total: 2.7 g/dL (ref 1.5–4.5)
Glucose: 107 mg/dL — ABNORMAL HIGH (ref 70–99)
Potassium: 4.7 mmol/L (ref 3.5–5.2)
Sodium: 141 mmol/L (ref 134–144)
Total Protein: 6.9 g/dL (ref 6.0–8.5)
eGFR: 55 mL/min/1.73 — ABNORMAL LOW (ref >59)

## 2023-10-21 ENCOUNTER — Ambulatory Visit (HOSPITAL_COMMUNITY)
Admission: RE | Admit: 2023-10-21 | Discharge: 2023-10-21 | Disposition: A | Discharge status: Home / Self Care | Source: Ambulatory Visit | Attending: Physical Medicine and Rehabilitation | Admitting: Physical Medicine and Rehabilitation

## 2023-10-21 DIAGNOSIS — D496 Neoplasm of unspecified behavior of brain: Secondary | ICD-10-CM | POA: Diagnosis not present

## 2023-10-21 DIAGNOSIS — G9389 Other specified disorders of brain: Secondary | ICD-10-CM | POA: Diagnosis not present

## 2023-10-21 DIAGNOSIS — D332 Benign neoplasm of brain, unspecified: Secondary | ICD-10-CM | POA: Diagnosis not present

## 2023-10-27 ENCOUNTER — Encounter: Payer: Self-pay | Admitting: Physical Medicine & Rehabilitation

## 2023-10-27 ENCOUNTER — Encounter: Attending: Physical Medicine & Rehabilitation | Admitting: Physical Medicine & Rehabilitation

## 2023-10-27 VITALS — BP 149/91 | HR 80 | Ht 64.0 in

## 2023-10-27 DIAGNOSIS — I69354 Hemiplegia and hemiparesis following cerebral infarction affecting left non-dominant side: Secondary | ICD-10-CM | POA: Insufficient documentation

## 2023-10-27 MED ORDER — INCOBOTULINUMTOXINA 100 UNITS IM SOLR
300.0000 [IU] | Freq: Once | INTRAMUSCULAR | Status: AC
  Administered 2023-10-27: 300 [IU] via INTRAMUSCULAR

## 2023-10-27 MED ORDER — SODIUM CHLORIDE (PF) 0.9 % IJ SOLN
6.0000 mL | Freq: Once | INTRAMUSCULAR | Status: AC
  Administered 2023-10-27: 6 mL

## 2023-10-27 NOTE — Progress Notes (Signed)
 Xeomin  Injection for spasticity using needle EMG guidance  Dilution: 50 Units/ml Indication: Severe spasticity which interferes with ADL,mobility and/or  hygiene and is unresponsive to medication management and other conservative care Informed consent was obtained after describing risks and benefits of the procedure with the patient. This includes bleeding, bruising, infection, excessive weakness, or medication side effects. A REMS form is on file and signed.  27g 1 needle electrode Number of units per muscle FCR 50  FDP 50 FDS 50 Pectoralis 100 1+ Waste 50 All injections were done after obtaining appropriate EMG activity and after negative drawback for blood. The patient tolerated the procedure well. Post procedure instructions were given. A followup appointment was made.

## 2023-10-28 ENCOUNTER — Other Ambulatory Visit: Payer: Self-pay | Admitting: Critical Care Medicine

## 2023-10-31 ENCOUNTER — Other Ambulatory Visit: Payer: Self-pay | Admitting: Physical Medicine and Rehabilitation

## 2023-11-02 ENCOUNTER — Ambulatory Visit: Admitting: Internal Medicine

## 2023-11-06 ENCOUNTER — Other Ambulatory Visit: Payer: Self-pay | Admitting: Internal Medicine

## 2023-11-06 DIAGNOSIS — K2210 Ulcer of esophagus without bleeding: Secondary | ICD-10-CM

## 2023-11-07 NOTE — Telephone Encounter (Signed)
 Requested Prescriptions  Pending Prescriptions Disp Refills   pantoprazole  (PROTONIX ) 40 MG tablet [Pharmacy Med Name: PANTOPRAZOLE  SOD DR 40 MG TAB] 90 tablet 0    Sig: TAKE 1 TABLET BY MOUTH EVERY DAY     Gastroenterology: Proton Pump Inhibitors Passed - 11/07/2023  8:35 AM      Passed - Valid encounter within last 12 months    Recent Outpatient Visits           4 months ago Type 2 diabetes mellitus with vascular disease (HCC)   [facility] [place]. [facility] Vicci Sober B, MD   10 months ago Type 2 diabetes mellitus with vascular disease [facility])   [facility] [place]. [facility] Brien Belvie BRAVO, MD   1 year ago Type 2 diabetes mellitus with vascular disease [facility])   [facility] [place]. [facility] Vicci Sober NOVAK, MD   1 year ago Physical deconditioning   [facility] [place]. [facility], [place], [place]   2 years ago Hemiparesis affecting left side as late effect of cerebrovascular accident (CVA) (HCC)   [facility] [place]. [facility] Vicci Sober NOVAK, MD

## 2023-12-03 ENCOUNTER — Ambulatory Visit: Admitting: Internal Medicine

## 2023-12-19 ENCOUNTER — Other Ambulatory Visit: Payer: Self-pay | Admitting: Internal Medicine

## 2023-12-30 ENCOUNTER — Other Ambulatory Visit: Payer: Self-pay | Admitting: Critical Care Medicine

## 2024-01-10 ENCOUNTER — Other Ambulatory Visit: Payer: Self-pay | Admitting: Internal Medicine

## 2024-01-10 DIAGNOSIS — E1159 Type 2 diabetes mellitus with other circulatory complications: Secondary | ICD-10-CM

## 2024-01-18 ENCOUNTER — Encounter: Attending: Physical Medicine & Rehabilitation | Admitting: Physical Medicine and Rehabilitation

## 2024-01-18 ENCOUNTER — Encounter: Payer: Self-pay | Admitting: Physical Medicine and Rehabilitation

## 2024-01-18 VITALS — BP 136/88 | HR 86 | Ht 64.0 in

## 2024-01-18 DIAGNOSIS — G8194 Hemiplegia, unspecified affecting left nondominant side: Secondary | ICD-10-CM | POA: Insufficient documentation

## 2024-01-18 DIAGNOSIS — R252 Cramp and spasm: Secondary | ICD-10-CM | POA: Diagnosis not present

## 2024-01-18 DIAGNOSIS — G8114 Spastic hemiplegia affecting left nondominant side: Secondary | ICD-10-CM | POA: Insufficient documentation

## 2024-01-18 DIAGNOSIS — Z993 Dependence on wheelchair: Secondary | ICD-10-CM | POA: Insufficient documentation

## 2024-01-18 DIAGNOSIS — I69398 Other sequelae of cerebral infarction: Secondary | ICD-10-CM | POA: Diagnosis not present

## 2024-01-18 DIAGNOSIS — N319 Neuromuscular dysfunction of bladder, unspecified: Secondary | ICD-10-CM | POA: Diagnosis not present

## 2024-01-18 DIAGNOSIS — I639 Cerebral infarction, unspecified: Secondary | ICD-10-CM | POA: Insufficient documentation

## 2024-01-18 MED ORDER — DANTROLENE SODIUM 100 MG PO CAPS: 100.0000 mg | ORAL_CAPSULE | Freq: Two times a day (BID) | ORAL | 3 refills | Status: AC

## 2024-01-18 NOTE — Patient Instructions (Signed)
 Pt is a 60  yr old male with previous R brainstem  CVA with L hemiplegia- along with R medial brain tumor; with recent new onset Afib, and worsening spasticity; sacral unstagable ulcer- that has resolved- ; gout flare- resolved; Acute on chornic AKI/CKD 3; DM with A1c of 6.7  Here for f/u on CVA.     We went over each individual medicine and it's purpose as to why- and that stroke is reason he's on so many meds.    2. We discussed ways to reduce the amount of meds at once- and suggested spacing them out.    3. BP 136/88- today-  so doing better   4. Check Blood sugars 2x/week and if you become lightheaded, dizzy, feel bad. Great job on keeping Diabetes BG's (blood glucoses)- and suggest doing  a notes section in phone.    5.  Wearing brace just at night-  or if takes abig nap.    6. Con't Dantrolene -  last appt 10/12/23- looked good- for CMP/LFT's-  100 mg 2x/day- for dantrolene - sent in refills for 1 year. Will recheck LFTs at next appt.    7. F/U with me F/u with Dr Princeton in 2 weeks-

## 2024-01-18 NOTE — Progress Notes (Signed)
 "  Subjective:    Patient ID: [name], male    DOB: [birth year], 59 y.o.   MRN: [id number]  HPI  Pt is a 59  yr old male with previous R brainstem  CVA with L hemiplegia- along with R medial brain tumor; with recent new onset Afib, and worsening spasticity; sacral unstagable ulcer- that has resolved- ; gout flare- resolved; Acute on chornic AKI/CKD 3; DM with A1c of 6.7  Here for f/u on CVA.     Saw Dr Carilyn for Botox 10/27/23 FCR 50   FDP 50 FDS 50 Pectoralis 100 1+ 250 units received.    Gets it every 3months and going well.   Not checking BG's still.  Does have Rx for test strips and lancets- but hasn't used them yet.  Due to lack of interest.   Doesn't meet criteria for vivastim due to micro-hemorrhages. .   Recent brain MRI shows unchanged  mass since 2022- 3.4 x 2 x 1.8 cm.   Their main concern is that he's on too much medicine.   Not wearing L wrist/hand splint- but did finally find it!  Sees PCP at end of January   Pain Inventory Average Pain 0 Pain Right Now 0 My pain is no pain  In the last 24 hours, has pain interfered with the following? General activity 0 Relation with others 0 Enjoyment of life 0 What TIME of day is your pain at its worst? No pain Sleep (in general) NA  Pain is worse with: no pain Pain improves with: no pain Relief from Meds: no pain  Family History  Problem Relation Age of Onset   Hypertension Mother    Heart disease Mother    Hyperlipidemia Mother    Heart failure Mother    Hypertension Sister    Hypertension Brother    Stroke Father    Hypertension Father    Colon cancer Maternal Grandmother    Diabetes Maternal Grandmother    Esophageal cancer Neg Hx    Social History   Socioeconomic History   Marital status: Married    Spouse name: Rosaline   Number of children: 1   Years of education: 16   Highest education level: Not on file  Occupational History   Occupation: disabled  Tobacco Use   Smoking  status: Never   Smokeless tobacco: Never  Vaping Use   Vaping status: Never Used  Substance and Sexual Activity   Alcohol use: Not Currently   Drug use: No   Sexual activity: Not Currently  Other Topics Concern   Not on file  Social History Narrative   Patient drinks caffeine a couple times a week.   Patient is right handed.   Lives with wife Everlina) and daughter   Social Drivers of Health   Tobacco Use: Low Risk (01/18/2024)   Patient History    Smoking Tobacco Use: Never    Smokeless Tobacco Use: Never    Passive Exposure: Not on file  Financial Resource Strain: Low Risk (07/28/2023)   Overall Financial Resource Strain (CARDIA)    Difficulty of Paying Living Expenses: Not hard at all  Food Insecurity: No Food Insecurity (07/28/2023)   Epic    Worried About Programme Researcher, Broadcasting/film/video in the Last Year: Never true    Ran Out of Food in the Last Year: Never true  Transportation Needs: No Transportation Needs (07/28/2023)   Epic    Lack of Transportation (Medical): No    Lack of Transportation (  Non-Medical): No  Physical Activity: Inactive (07/28/2023)   Exercise Vital Sign    Days of Exercise per Week: 0 days    Minutes of Exercise per Session: 0 min  Stress: No Stress Concern Present (07/28/2023)   [employer] of Occupational Health - Occupational Stress Questionnaire    Feeling of Stress: Not at all  Social Connections: Moderately Integrated (07/28/2023)   Social Connection and Isolation Panel    Frequency of Communication with Friends and Family: Once a week    Frequency of Social Gatherings with Friends and Family: Once a week    Attends Religious Services: More than 4 times per year    Active Member of Clubs or Organizations: Yes    Attends Banker Meetings: 1 to 4 times per year    Marital Status: Married  Depression (PHQ2-9): Low Risk (01/18/2024)   Depression (PHQ2-9)    PHQ-2 Score: 0  Alcohol Screen: Low Risk (07/28/2023)   Alcohol Screen    Last  Alcohol Screening Score (AUDIT): 0  Housing: Low Risk (07/28/2023)   Epic    Unable to Pay for Housing in the Last Year: No    Number of Times Moved in the Last Year: 0    Homeless in the Last Year: No  Utilities: Not At Risk (07/28/2023)   Epic    Threatened with loss of utilities: No  Health Literacy: Adequate Health Literacy (07/28/2023)   B1300 Health Literacy    Frequency of need for help with medical instructions: Never   Past Surgical History:  Procedure Laterality Date   APPENDECTOMY     BIOPSY  11/18/2020   Procedure: BIOPSY;  Surgeon: Federico Rosario BROCKS, MD;  Location: [facility] ENDOSCOPY;  Service: Gastroenterology;;   COLONOSCOPY N/A 11/18/2020   Procedure: COLONOSCOPY;  Surgeon: Federico Rosario BROCKS, MD;  Location: [facility] ENDOSCOPY;  Service: Gastroenterology;  Laterality: N/A;   ESOPHAGOGASTRODUODENOSCOPY (EGD) WITH PROPOFOL  N/A 11/18/2020   Procedure: ESOPHAGOGASTRODUODENOSCOPY (EGD) WITH PROPOFOL ;  Surgeon: Federico Rosario BROCKS, MD;  Location: [facility] ENDOSCOPY;  Service: Gastroenterology;  Laterality: N/A;   JOINT REPLACEMENT Left 2004   Left hip   POLYPECTOMY  11/18/2020   Procedure: POLYPECTOMY;  Surgeon: Federico Rosario BROCKS, MD;  Location: [facility] ENDOSCOPY;  Service: Gastroenterology;;   TOTAL HIP ARTHROPLASTY     Past Surgical History:  Procedure Laterality Date   APPENDECTOMY     BIOPSY  11/18/2020   Procedure: BIOPSY;  Surgeon: Federico Rosario BROCKS, MD;  Location: [facility] ENDOSCOPY;  Service: Gastroenterology;;   COLONOSCOPY N/A 11/18/2020   Procedure: COLONOSCOPY;  Surgeon: Federico Rosario BROCKS, MD;  Location: [facility] ENDOSCOPY;  Service: Gastroenterology;  Laterality: N/A;   ESOPHAGOGASTRODUODENOSCOPY (EGD) WITH PROPOFOL  N/A 11/18/2020   Procedure: ESOPHAGOGASTRODUODENOSCOPY (EGD) WITH PROPOFOL ;  Surgeon: Federico Rosario BROCKS, MD;  Location: [facility] ENDOSCOPY;  Service: Gastroenterology;  Laterality: N/A;   JOINT REPLACEMENT Left 2004   Left hip   POLYPECTOMY  11/18/2020   Procedure: POLYPECTOMY;  Surgeon: Federico Rosario BROCKS, MD;   Location: [facility] ENDOSCOPY;  Service: Gastroenterology;;   TOTAL HIP ARTHROPLASTY     Past Medical History:  Diagnosis Date   Diabetes mellitus without complication (HCC)    Gait abnormality 03/31/2018   Hemiparesis and speech and language deficit as late effects of stroke (HCC) 03/31/2018   Hypertension    Osteomyelitis of coccyx (HCC) 02/19/2021   Pressure injury of skin 11/26/2020   Rectal cancer (HCC) 2009   rectal   Sacral osteomyelitis (HCC) 02/19/2021   Seizure (HCC)  Stroke (HCC) 2018   BP 136/88   Pulse 86   Ht 5' 4 (1.626 m)   SpO2 96%   BMI 34.33 kg/m   Opioid Risk Score:   Fall Risk Score:  `1  Depression screen [facility] 2/9     01/18/2024    9:49 AM 10/27/2023    9:48 AM 10/12/2023    8:55 AM 07/28/2023   10:12 AM 07/02/2023   10:05 AM 07/02/2023   10:04 AM 03/30/2023    9:44 AM  Depression screen PHQ 2/9  Decreased Interest 0 0 0 0 0 0 1  Down, Depressed, Hopeless 0 0 0 0 0 0 1  PHQ - 2 Score 0 0 0 0 0 0 2  Altered sleeping    0 0    Tired, decreased energy    0 0    Change in appetite    0 0    Feeling bad or failure about yourself     0 0    Trouble concentrating    0 0    Moving slowly or fidgety/restless    0 0    Suicidal thoughts    0 0    PHQ-9 Score    0  0     Difficult doing work/chores    Not difficult at all Not difficult at all       Data saved with a previous flowsheet row definition    Review of Systems  Musculoskeletal:  Positive for gait problem.  All other systems reviewed and are negative.      Objective:   Physical Exam  Awake, alert, appropriate, accompanied by wife, in manual w/c, NAD  MSK: RUE-5/5 throughout LUE- Biceps and deltoid 2/5; triceps 2-/5; WE 1/5; Grip 2-/5 and FA 0/5  RLE_ 5/5 throughout LLE- HF 4/5, KE/KF 4 to 4-/5; and DF 2/5 and PF 2 to 2-/5   Neuro: no clonus B/L  MAS of 2in L hand/fingers- curled- at PIPs L hand Hyperextension of tip of L thumb       Assessment & Plan:   Pt is a 59  yr old male  with previous R brainstem  CVA with L hemiplegia- along with R medial brain tumor; with recent new onset Afib, and worsening spasticity; sacral unstagable ulcer- that has resolved- ; gout flare- resolved; Acute on chornic AKI/CKD 3; DM with A1c of 6.7  Here for f/u on CVA.     We went over each individual medicine and it's purpose as to why- and that stroke is reason he's on so many meds.    2. We discussed ways to reduce the amount of meds at once- and suggested spacing them out.    3. BP 136/88- today-  so doing better   4. Check Blood sugars 2x/week and if you become lightheaded, dizzy, feel bad. Great job on keeping Diabetes BG's (blood glucoses)- and suggest doing  a notes section in phone.    5.  Wearing brace just at night-  or if takes abig nap.    6. Con't Dantrolene -  last appt 10/12/23- looked good- for CMP/LFT's-  100 mg 2x/day- for dantrolene - sent in refills for 1 year. Will recheck LFTs at next appt.    7. F/U with me F/u with Dr Princeton in 2 weeks-   I spent a total of  32  minutes on total care today- >50% coordination of care- due to went over each individual medicine and purpose and why he needs it  and explained why cannot reduce meds- form PCP, cholesterol, DM and HTN, as well as Dantrolene , Prtotonix and Vesicare . Also went over labs results and timing and Botox appt.      "

## 2024-01-28 ENCOUNTER — Encounter: Admitting: Physical Medicine & Rehabilitation

## 2024-01-28 ENCOUNTER — Encounter: Payer: Self-pay | Admitting: Physical Medicine & Rehabilitation

## 2024-01-28 VITALS — BP 124/86 | HR 90

## 2024-01-28 DIAGNOSIS — G8114 Spastic hemiplegia affecting left nondominant side: Secondary | ICD-10-CM | POA: Diagnosis not present

## 2024-01-28 DIAGNOSIS — I639 Cerebral infarction, unspecified: Secondary | ICD-10-CM | POA: Diagnosis not present

## 2024-01-28 MED ORDER — INCOBOTULINUMTOXINA 100 UNITS IM SOLR
300.0000 [IU] | INTRAMUSCULAR | Status: AC
  Administered 2024-01-28: 300 [IU] via INTRAMUSCULAR

## 2024-01-28 MED ORDER — SODIUM CHLORIDE (PF) 0.9 % IJ SOLN
6.0000 mL | Freq: Once | INTRAMUSCULAR | Status: AC
  Administered 2024-01-28: 6 mL via INTRAVENOUS

## 2024-01-28 NOTE — Progress Notes (Signed)
 Xeomin  Injection for spasticity using needle EMG guidance  Dilution: 50 Units/ml Indication: Severe spasticity which interferes with ADL,mobility and/or  hygiene and is unresponsive to medication management and other conservative care Informed consent was obtained after describing risks and benefits of the procedure with the patient. This includes bleeding, bruising, infection, excessive weakness, or medication side effects. A REMS form is on file and signed.  27g 1 needle electrode Number of units per muscle FCR 50  FDP 50 FDS 50 Pectoralis 100 1+ Pron teres 50 All injections were done after obtaining appropriate EMG activity and after negative drawback for blood. The patient tolerated the procedure well. Post procedure instructions were given. A followup appointment was made.

## 2024-01-28 NOTE — Patient Instructions (Addendum)
" °  ° ° ° ° ° ° ° ° ° ° °  You received a Xeomin  injection today. You may experience soreness at the needle injection sites. Please call us  if any of the injection sites turns red after a couple days or if there is any drainage. You may experience muscle weakness as a result of Xeomin  This would improve with time but can take several weeks to improve. The Xeomin  should start working in about one week. The Xeomin  usually last 3 months. The injection can be repeated every 3 months as needed.            Contains text generated by [employer].                                 Contains text generated by [employer].   "

## 2024-02-05 ENCOUNTER — Other Ambulatory Visit: Payer: Self-pay | Admitting: Internal Medicine

## 2024-02-05 ENCOUNTER — Other Ambulatory Visit: Payer: Self-pay | Admitting: Critical Care Medicine

## 2024-02-05 DIAGNOSIS — E1159 Type 2 diabetes mellitus with other circulatory complications: Secondary | ICD-10-CM

## 2024-02-05 DIAGNOSIS — I480 Paroxysmal atrial fibrillation: Secondary | ICD-10-CM

## 2024-02-11 ENCOUNTER — Ambulatory Visit: Admitting: Internal Medicine

## 2024-03-17 ENCOUNTER — Ambulatory Visit: Admitting: Internal Medicine

## 2024-04-18 ENCOUNTER — Encounter: Admitting: Physical Medicine and Rehabilitation

## 2024-04-28 ENCOUNTER — Encounter: Admitting: Physical Medicine & Rehabilitation

## 2024-05-02 ENCOUNTER — Encounter: Attending: Physical Medicine & Rehabilitation | Admitting: Physical Medicine and Rehabilitation

## 2024-08-02 ENCOUNTER — Ambulatory Visit
